# Patient Record
Sex: Female | Born: 1940
Health system: Southern US, Community
[De-identification: ages and names within clinical notes are randomized; demographics above are authoritative.]

## PROBLEM LIST (undated history)

## (undated) ENCOUNTER — Emergency Department (HOSPITAL_COMMUNITY): Disposition: A | Discharge status: Home / Self Care | Payer: Medicare HMO

## (undated) DIAGNOSIS — E042 Nontoxic multinodular goiter: Secondary | ICD-10-CM

## (undated) DIAGNOSIS — I509 Heart failure, unspecified: Secondary | ICD-10-CM

## (undated) DIAGNOSIS — I480 Paroxysmal atrial fibrillation: Secondary | ICD-10-CM

## (undated) DIAGNOSIS — I5030 Unspecified diastolic (congestive) heart failure: Secondary | ICD-10-CM

## (undated) DIAGNOSIS — R1310 Dysphagia, unspecified: Secondary | ICD-10-CM

## (undated) DIAGNOSIS — I10 Essential (primary) hypertension: Secondary | ICD-10-CM

## (undated) DIAGNOSIS — I2721 Secondary pulmonary arterial hypertension: Secondary | ICD-10-CM

## (undated) DIAGNOSIS — I340 Nonrheumatic mitral (valve) insufficiency: Secondary | ICD-10-CM

## (undated) DIAGNOSIS — I779 Disorder of arteries and arterioles, unspecified: Secondary | ICD-10-CM

## (undated) DIAGNOSIS — E739 Lactose intolerance, unspecified: Secondary | ICD-10-CM

## (undated) DIAGNOSIS — K760 Fatty (change of) liver, not elsewhere classified: Secondary | ICD-10-CM

## (undated) DIAGNOSIS — E669 Obesity, unspecified: Secondary | ICD-10-CM

## (undated) DIAGNOSIS — M419 Scoliosis, unspecified: Secondary | ICD-10-CM

## (undated) DIAGNOSIS — M25569 Pain in unspecified knee: Secondary | ICD-10-CM

## (undated) DIAGNOSIS — G4733 Obstructive sleep apnea (adult) (pediatric): Secondary | ICD-10-CM

## (undated) DIAGNOSIS — R0602 Shortness of breath: Secondary | ICD-10-CM

## (undated) DIAGNOSIS — Z9289 Personal history of other medical treatment: Secondary | ICD-10-CM

## (undated) DIAGNOSIS — M1990 Unspecified osteoarthritis, unspecified site: Secondary | ICD-10-CM

## (undated) DIAGNOSIS — K8020 Calculus of gallbladder without cholecystitis without obstruction: Secondary | ICD-10-CM

## (undated) DIAGNOSIS — M549 Dorsalgia, unspecified: Secondary | ICD-10-CM

## (undated) DIAGNOSIS — I4819 Other persistent atrial fibrillation: Secondary | ICD-10-CM

## (undated) DIAGNOSIS — M1710 Unilateral primary osteoarthritis, unspecified knee: Secondary | ICD-10-CM

## (undated) DIAGNOSIS — R600 Localized edema: Secondary | ICD-10-CM

## (undated) DIAGNOSIS — K219 Gastro-esophageal reflux disease without esophagitis: Secondary | ICD-10-CM

## (undated) HISTORY — DX: Nontoxic multinodular goiter: E04.2

## (undated) HISTORY — DX: Localized edema: R60.0

## (undated) HISTORY — DX: Unspecified diastolic (congestive) heart failure: I50.30

## (undated) HISTORY — DX: Disorder of arteries and arterioles, unspecified: I77.9

## (undated) HISTORY — DX: Lactose intolerance, unspecified: E73.9

## (undated) HISTORY — DX: Obstructive sleep apnea (adult) (pediatric): G47.33

## (undated) HISTORY — DX: Calculus of gallbladder without cholecystitis without obstruction: K80.20

## (undated) HISTORY — DX: Fatty (change of) liver, not elsewhere classified: K76.0

## (undated) HISTORY — DX: Nonrheumatic mitral (valve) insufficiency: I34.0

## (undated) HISTORY — DX: Other persistent atrial fibrillation: I48.19

## (undated) HISTORY — DX: Paroxysmal atrial fibrillation: I48.0

## (undated) HISTORY — DX: Secondary pulmonary arterial hypertension: I27.21

## (undated) HISTORY — DX: Shortness of breath: R06.02

## (undated) HISTORY — DX: Dorsalgia, unspecified: M54.9

## (undated) HISTORY — DX: Unilateral primary osteoarthritis, unspecified knee: M17.10

## (undated) HISTORY — DX: Obesity, unspecified: E66.9

## (undated) HISTORY — DX: Scoliosis, unspecified: M41.9

## (undated) HISTORY — DX: Personal history of other medical treatment: Z92.89

## (undated) HISTORY — DX: Dysphagia, unspecified: R13.10

## (undated) HISTORY — DX: Pain in unspecified knee: M25.569

---
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
## 2002-05-16 HISTORY — PX: KNEE ARTHROSCOPY: SUR90

## 2006-02-21 ENCOUNTER — Ambulatory Visit: Payer: Self-pay | Admitting: Family Medicine

## 2007-05-17 HISTORY — PX: REPLACEMENT TOTAL KNEE: SUR1224

## 2007-07-30 DIAGNOSIS — E7800 Pure hypercholesterolemia, unspecified: Secondary | ICD-10-CM | POA: Insufficient documentation

## 2007-10-31 ENCOUNTER — Ambulatory Visit: Payer: Self-pay | Admitting: Specialist

## 2007-12-12 DIAGNOSIS — G4733 Obstructive sleep apnea (adult) (pediatric): Secondary | ICD-10-CM | POA: Insufficient documentation

## 2008-04-01 ENCOUNTER — Encounter: Payer: Self-pay | Admitting: Orthopedic Surgery

## 2008-04-15 ENCOUNTER — Encounter: Payer: Self-pay | Admitting: Orthopedic Surgery

## 2008-05-16 ENCOUNTER — Encounter: Payer: Self-pay | Admitting: Orthopedic Surgery

## 2008-05-16 HISTORY — PX: HAMMER TOE SURGERY: SHX385

## 2008-08-20 ENCOUNTER — Ambulatory Visit: Payer: Self-pay | Admitting: Family Medicine

## 2008-08-20 DIAGNOSIS — M542 Cervicalgia: Secondary | ICD-10-CM | POA: Insufficient documentation

## 2009-04-20 ENCOUNTER — Ambulatory Visit: Payer: Self-pay | Admitting: Gastroenterology

## 2009-06-20 DIAGNOSIS — E041 Nontoxic single thyroid nodule: Secondary | ICD-10-CM | POA: Insufficient documentation

## 2010-09-15 ENCOUNTER — Ambulatory Visit: Payer: Self-pay | Admitting: Family Medicine

## 2010-12-13 ENCOUNTER — Ambulatory Visit: Payer: Self-pay | Admitting: Family Medicine

## 2011-01-12 ENCOUNTER — Ambulatory Visit: Payer: Self-pay | Admitting: Otolaryngology

## 2011-09-29 DIAGNOSIS — Z96659 Presence of unspecified artificial knee joint: Secondary | ICD-10-CM | POA: Insufficient documentation

## 2011-09-29 DIAGNOSIS — Z96652 Presence of left artificial knee joint: Secondary | ICD-10-CM | POA: Insufficient documentation

## 2011-09-29 DIAGNOSIS — M1710 Unilateral primary osteoarthritis, unspecified knee: Secondary | ICD-10-CM | POA: Insufficient documentation

## 2011-09-29 DIAGNOSIS — M179 Osteoarthritis of knee, unspecified: Secondary | ICD-10-CM | POA: Insufficient documentation

## 2011-09-29 DIAGNOSIS — M1712 Unilateral primary osteoarthritis, left knee: Secondary | ICD-10-CM | POA: Insufficient documentation

## 2011-09-29 DIAGNOSIS — M5417 Radiculopathy, lumbosacral region: Secondary | ICD-10-CM | POA: Insufficient documentation

## 2011-10-03 DIAGNOSIS — M1610 Unilateral primary osteoarthritis, unspecified hip: Secondary | ICD-10-CM | POA: Insufficient documentation

## 2012-12-05 ENCOUNTER — Ambulatory Visit: Payer: Self-pay | Admitting: Otolaryngology

## 2013-04-05 ENCOUNTER — Emergency Department: Payer: Self-pay | Admitting: Internal Medicine

## 2013-04-08 ENCOUNTER — Ambulatory Visit: Payer: Self-pay | Admitting: Specialist

## 2013-04-08 HISTORY — PX: SKIN GRAFT: SHX250

## 2013-08-06 ENCOUNTER — Ambulatory Visit: Payer: Self-pay | Admitting: Family Medicine

## 2013-11-14 ENCOUNTER — Other Ambulatory Visit: Payer: Self-pay | Admitting: Family Medicine

## 2014-02-20 LAB — HM COLONOSCOPY

## 2014-09-05 DIAGNOSIS — H2513 Age-related nuclear cataract, bilateral: Secondary | ICD-10-CM | POA: Diagnosis not present

## 2014-09-05 NOTE — Op Note (Signed)
PATIENT NAME:  WHITLEY, PATCHEN MR#:  [id number] DATE OF BIRTH:  29-Oct-1940  DATE OF PROCEDURE:  04/08/2013  PREOPERATIVE DIAGNOSIS: Amputation, left index finger, through mid distal phalanx.   POSTOPERATIVE DIAGNOSIS:  Amputation, left index finger, through mid distal phalanx.   PROCEDURE:  Irrigation and debridement, left index finger, with full-thickness skin grafting.   SURGEON: Christophe Louis, M.D.   ANESTHESIA: General.   COMPLICATIONS: None.   TOURNIQUET TIME: None.   DESCRIPTION OF PROCEDURE:  One gram of Ancef was given intravenously prior to the procedure. General anesthesia is induced. The left hand and upper extremity are thoroughly prepped with alcohol and ChloraPrep and draped in standard sterile fashion. Under loupe magnification, the tip of the index finger is thoroughly debrided including 2 pieces of bone which are comminuted and protruding. After debriding all dead tissue and bone, the wound is thoroughly irrigated multiple times with normal saline. Full-thickness skin graft is then taken elliptically from the hypothenar eminence in the size that would fit the tip of the index finger. The skin edges at the hypothenar eminence are undermined, and the skin is then closed with 4-0 nylon. The skin edges are infiltrated with 0.5% plain Marcaine. The full-thickness skin graft is then sewn using multiple 4-0 nylon sutures onto the tip of the index finger and then a bolus-type dressing is firmly sewn down consisting of Telfa and Neosporin. Soft bulky dressing is applied. The patient is returned to the recovery room in satisfactory condition having tolerated the procedure quite well.     ____________________________ Lucas Mallow, MD ces:dmm D: 04/09/2013 08:33:25 ET T: 04/09/2013 09:33:39 ET JOB#: [id number]  cc: Lucas Mallow, MD, <Dictator> Lucas Mallow MD ELECTRONICALLY SIGNED 04/10/2013 6:41

## 2014-10-06 DIAGNOSIS — Z0000 Encounter for general adult medical examination without abnormal findings: Secondary | ICD-10-CM | POA: Diagnosis not present

## 2014-10-06 DIAGNOSIS — Z23 Encounter for immunization: Secondary | ICD-10-CM | POA: Diagnosis not present

## 2014-10-24 DIAGNOSIS — K219 Gastro-esophageal reflux disease without esophagitis: Secondary | ICD-10-CM | POA: Diagnosis not present

## 2014-10-24 DIAGNOSIS — I10 Essential (primary) hypertension: Secondary | ICD-10-CM | POA: Diagnosis not present

## 2014-10-24 DIAGNOSIS — E780 Pure hypercholesterolemia: Secondary | ICD-10-CM | POA: Diagnosis not present

## 2014-11-05 ENCOUNTER — Telehealth: Payer: Self-pay | Admitting: Family Medicine

## 2014-11-05 ENCOUNTER — Other Ambulatory Visit: Payer: Self-pay | Admitting: Family Medicine

## 2014-11-05 DIAGNOSIS — Z1231 Encounter for screening mammogram for malignant neoplasm of breast: Secondary | ICD-10-CM

## 2014-11-05 NOTE — Telephone Encounter (Signed)
Pt returning call.  CB#[phone]/MJ

## 2014-11-07 ENCOUNTER — Ambulatory Visit: Payer: Self-pay

## 2014-11-10 ENCOUNTER — Encounter: Payer: Self-pay | Admitting: Family Medicine

## 2014-11-10 ENCOUNTER — Telehealth: Payer: Self-pay | Admitting: Family Medicine

## 2014-11-10 DIAGNOSIS — E785 Hyperlipidemia, unspecified: Secondary | ICD-10-CM

## 2014-11-10 DIAGNOSIS — M79642 Pain in left hand: Secondary | ICD-10-CM

## 2014-11-10 MED ORDER — ATORVASTATIN CALCIUM 10 MG PO TABS: 10.0000 mg | ORAL_TABLET | Freq: Every day | ORAL | Status: DC

## 2014-11-10 NOTE — Telephone Encounter (Signed)
Patient refused medication. Patient reports she will work on lifestyle changes and will recheck labs in one year. I called [facility] to cancel prescription. Patient is requesting a referral to see Dr. Tamala Julian at [facility].

## 2014-11-10 NOTE — Telephone Encounter (Signed)
Sent rx. Have patient call for new lab slip to recheck labs in 6 weeks.  Stop if any side effects, especially muscle cramps or pain. Thanks.   [name] Kitchen

## 2014-11-12 ENCOUNTER — Ambulatory Visit
Admission: RE | Admit: 2014-11-12 | Discharge: 2014-11-12 | Disposition: A | Discharge status: Home / Self Care | Payer: Commercial Managed Care - HMO | Source: Ambulatory Visit | Attending: Family Medicine | Admitting: Family Medicine

## 2014-11-12 ENCOUNTER — Other Ambulatory Visit: Payer: Self-pay | Admitting: Family Medicine

## 2014-11-12 DIAGNOSIS — Z1231 Encounter for screening mammogram for malignant neoplasm of breast: Secondary | ICD-10-CM | POA: Diagnosis not present

## 2014-11-13 ENCOUNTER — Encounter: Payer: Self-pay | Admitting: Family Medicine

## 2014-11-18 DIAGNOSIS — K589 Irritable bowel syndrome without diarrhea: Secondary | ICD-10-CM | POA: Insufficient documentation

## 2014-11-18 DIAGNOSIS — K219 Gastro-esophageal reflux disease without esophagitis: Secondary | ICD-10-CM | POA: Insufficient documentation

## 2014-11-25 DIAGNOSIS — M6588 Other synovitis and tenosynovitis, other site: Secondary | ICD-10-CM | POA: Diagnosis not present

## 2015-02-16 ENCOUNTER — Telehealth: Payer: Self-pay | Admitting: *Deleted

## 2015-02-16 ENCOUNTER — Encounter: Payer: Self-pay | Admitting: Family Medicine

## 2015-02-16 ENCOUNTER — Ambulatory Visit (INDEPENDENT_AMBULATORY_CARE_PROVIDER_SITE_OTHER): Payer: Commercial Managed Care - HMO | Admitting: Family Medicine

## 2015-02-16 VITALS — BP 142/78 | HR 72 | Temp 98.2°F | Resp 16 | Ht 66.25 in | Wt 212.0 lb

## 2015-02-16 DIAGNOSIS — M546 Pain in thoracic spine: Secondary | ICD-10-CM

## 2015-02-16 DIAGNOSIS — R0602 Shortness of breath: Secondary | ICD-10-CM

## 2015-02-16 DIAGNOSIS — M549 Dorsalgia, unspecified: Secondary | ICD-10-CM

## 2015-02-16 NOTE — Progress Notes (Signed)
Subjective:    Patient ID: Lauren Lloyd, female    DOB: [birth year], 74 y.o.   MRN: [id number]  Back Pain This is a new problem. The current episode started in the past 7 days. The problem occurs constantly. The problem is unchanged. The pain is present in the thoracic spine. The quality of the pain is described as stabbing and aching. Radiates to: Left arm. The pain is at a severity of 3/10 (Has been as high as 8/10). The pain is worse during the day. The symptoms are aggravated by position. Pertinent negatives include no abdominal pain, bladder incontinence, bowel incontinence, chest pain, dysuria, fever or leg pain. She has tried heat and analgesics for the symptoms. The treatment provided mild relief.  Shortness of Breath This is a new problem. The current episode started yesterday. The problem occurs daily. The problem has been unchanged. Associated symptoms include leg swelling (Ankles swell occasionally.). Pertinent negatives include no abdominal pain, chest pain, ear pain, fever, leg pain, syncope, vomiting or wheezing.      Review of Systems  Constitutional: Negative for fever, chills, diaphoresis, activity change, appetite change, fatigue and unexpected weight change.  HENT: Negative for ear pain.   Respiratory: Positive for chest tightness and shortness of breath. Negative for apnea, cough, choking, wheezing and stridor.   Cardiovascular: Positive for leg swelling (Ankles swell occasionally.). Negative for chest pain, palpitations and syncope.  Gastrointestinal: Positive for nausea. Negative for vomiting, abdominal pain, diarrhea, constipation, blood in stool, abdominal distention, anal bleeding, rectal pain and bowel incontinence.  Genitourinary: Positive for urgency and frequency. Negative for bladder incontinence, dysuria, hematuria, flank pain, decreased urine volume, vaginal bleeding, vaginal discharge, enuresis, difficulty urinating, vaginal pain and dyspareunia.   Musculoskeletal: Positive for back pain.  All other systems reviewed and are negative.   Patient Active Problem List   Diagnosis Date Noted  . Acid reflux 11/18/2014  . 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 Hyperlipemia 11/10/2014  . Primary osteoarthritis of one hip 10/03/2011  . L-S radiculopathy 09/29/2011  . Arthritis of knee, degenerative 09/29/2011  . H/O knee surgery 09/29/2011  . History of knee surgery 09/29/2011  . Non-toxic uninodular goiter 06/20/2009  . Cervical pain 08/20/2008  . Cannot sleep 12/12/2007  . Hypercholesteremia 07/30/2007   Family History  Problem Relation Age of Onset  . Hyperlipidemia Sister   . Transient ischemic attack Mother   . Heart attack Father   . Healthy Brother   . Breast cancer Sister 31  . Hyperlipidemia Sister    Social History   Social History  . Marital Status: Single    Spouse Name: N/A  . Number of Children: 5  . Years of Education: H/S   Occupational History  . Part Time    Social History Main Topics  . Smoking status: Former Smoker    Quit date: 05/17/1991  . Smokeless tobacco: Never Used  . Alcohol Use: Yes     Comment: Occasional Glass of Wine.  . Drug Use: No  . Sexual Activity: Not on file   Other Topics Concern  . Not on file   Social History Narrative   Past Surgical History  Procedure Laterality Date  . Hammer toe surgery  2010  . Skin graft Left 04/08/2013    Done on left index finger  . Replacement total knee Right 2009    [facility]  . Knee arthroscopy Right 2004  . Tonsillectomy  1946   Allergies  Allergen Reactions  .  Iodinated Diagnostic Agents     Other reaction(s): Unknown  . Levofloxacin     Other reaction(s): Joint Pains   Previous Medications   HYDROCOD POLST-CHLORPHEN POLST    Take by mouth.   MELOXICAM (MOBIC) 15 MG TABLET    Take 15 mg by mouth daily. with food   OMEPRAZOLE (PRILOSEC) 20 MG CAPSULE    Take by mouth.   BP 142/78 mmHg  Pulse 72  Temp(Src)  98.2 F (36.8 C) (Oral)  Resp 16  Ht 5' 6.25" (1.683 m)  Wt 212 lb (96.163 kg)  BMI 33.95 kg/m2      Objective:   Physical Exam  Constitutional: She appears well-developed and well-nourished.  Cardiovascular: Normal rate, regular rhythm and S1 normal.   Pulmonary/Chest: Effort normal and breath sounds normal.  Musculoskeletal: Normal range of motion.       Thoracic back: She exhibits pain (Below her left shoulder. ).  Neurological: She is alert.  Psychiatric: She has a normal mood and affect. Her speech is normal and behavior is normal. Judgment and thought content normal. Cognition and memory are normal.       Assessment & Plan:   1. Upper back pain on left side Unclear if muscle strain. Will monitor.    2. Shortness of breath Concerning for cardiac issue.EKG ok today.  Will refer to cardiology. ER if any worsening SOB or chest pain. Continue ASA.   - EKG 12-Lead  Patient was seen and examined by Jerrell Belfast, MD, and note scribed by Ashley Royalty, CMA.  I have reviewed the document for accuracy and completeness and I agree with above. Jerrell Belfast, MD   Margarita Rana, MD  - Ambulatory referral to Cardiology

## 2015-02-16 NOTE — Telephone Encounter (Signed)
Sarah from [place] [facility] calling needing pt to get asap per Dr Venia Minks.  States pt is SOB and we did not have anything close, stated to her we would look at the EKG and give them a call back  Please call today [phone] ext [phone] for Judson Roch

## 2015-02-17 ENCOUNTER — Ambulatory Visit (INDEPENDENT_AMBULATORY_CARE_PROVIDER_SITE_OTHER): Payer: Commercial Managed Care - HMO | Admitting: Cardiovascular Disease

## 2015-02-17 ENCOUNTER — Other Ambulatory Visit
Admission: RE | Admit: 2015-02-17 | Discharge: 2015-02-17 | Disposition: A | Discharge status: Home / Self Care | Payer: Commercial Managed Care - HMO | Source: Ambulatory Visit | Attending: Cardiovascular Disease | Admitting: Cardiovascular Disease

## 2015-02-17 ENCOUNTER — Encounter: Payer: Self-pay | Admitting: Cardiovascular Disease

## 2015-02-17 VITALS — BP 154/72 | HR 72 | Ht 66.5 in | Wt 209.8 lb

## 2015-02-17 DIAGNOSIS — E785 Hyperlipidemia, unspecified: Secondary | ICD-10-CM | POA: Diagnosis not present

## 2015-02-17 DIAGNOSIS — R0602 Shortness of breath: Secondary | ICD-10-CM | POA: Diagnosis not present

## 2015-02-17 DIAGNOSIS — R079 Chest pain, unspecified: Secondary | ICD-10-CM | POA: Diagnosis not present

## 2015-02-17 DIAGNOSIS — R0789 Other chest pain: Secondary | ICD-10-CM | POA: Diagnosis not present

## 2015-02-17 LAB — BRAIN NATRIURETIC PEPTIDE: B NATRIURETIC PEPTIDE 5: 177 pg/mL — AB (ref 0.0–100.0)

## 2015-02-17 LAB — FIBRIN DERIVATIVES D-DIMER (ARMC ONLY): FIBRIN DERIVATIVES D-DIMER (ARMC): 676 — AB (ref 0–499)

## 2015-02-17 LAB — TROPONIN I: Troponin I: <0.03 ng/mL (ref <0.031)

## 2015-02-17 NOTE — Patient Instructions (Addendum)
Medication Instructions:  Your physician recommends that you continue on your current medications as directed. Please refer to the Current Medication list given to you today.   Labwork: Troponin, d-dimer, BNP  Testing/Procedures: Your physician has requested that you have an echocardiogram. Echocardiography is a painless test that uses sound waves to create images of your heart. It provides your doctor with information about the size and shape of your heart and how well your heart's chambers and valves are working. This procedure takes approximately one hour. There are no restrictions for this procedure.  Your physician has requested that you have a lexiscan myoview. For further information please visit [email or web address]. Please follow instruction sheet, as given.  [place]  Your caregiver has ordered a Stress Test with nuclear imaging. The purpose of this test is to evaluate the blood supply to your heart muscle. This procedure is referred to as a "Non-Invasive Stress Test." This is because other than having an IV started in your vein, nothing is inserted or "invades" your body. Cardiac stress tests are done to find areas of poor blood flow to the heart by determining the extent of coronary artery disease (CAD). Some patients exercise on a treadmill, which naturally increases the blood flow to your heart, while others who are  unable to walk on a treadmill due to physical limitations have a pharmacologic/chemical stress agent called Lexiscan . This medicine will mimic walking on a treadmill by temporarily increasing your coronary blood flow.   Please note: these test may take anywhere between 2-4 hours to complete  PLEASE REPORT TO [facility] AT THE FIRST DESK WILL DIRECT YOU WHERE TO GO  Date of Procedure: Monday, October 10, 8:30am Arrival Time for Procedure: 8:00am  Instructions regarding medication:   You may take all your morning medications with  a sip of water  PLEASE NOTIFY THE OFFICE AT LEAST 24 HOURS IN ADVANCE IF YOU ARE UNABLE TO [employer].  [phone] AND  PLEASE NOTIFY NUCLEAR MEDICINE AT [facility] AT LEAST 24 HOURS IN ADVANCE IF YOU ARE UNABLE TO KEEP YOUR APPOINTMENT. [phone]  How to prepare for your Myoview test:  1. Do not eat or drink after midnight 2. No caffeine for 24 hours prior to test 3. No smoking 24 hours prior to test. 4. Your medication may be taken with water.  If your doctor stopped a medication because of this test, do not take that medication. 5. Ladies, please do not wear dresses.  Skirts or pants are appropriate. Please wear a short sleeve shirt. 6. No perfume, cologne or lotion. 7. Wear comfortable walking shoes. No heels!            Follow-Up: Your physician recommends that you schedule a follow-up appointment after all tests are complete.   Any Other Special Instructions Will Be Listed Below (If Applicable).  Echocardiogram An echocardiogram, or echocardiography, uses sound waves (ultrasound) to produce an image of your heart. The echocardiogram is simple, painless, obtained within a short period of time, and offers valuable information to your health care provider. The images from an echocardiogram can provide information such as:  Evidence of coronary artery disease (CAD).  Heart size.  Heart muscle function.  Heart valve function.  Aneurysm detection.  Evidence of a past heart attack.  Fluid buildup around the heart.  Heart muscle thickening.  Assess heart valve function. LET [facility] CARE PROVIDER KNOW ABOUT:  Any allergies you have.  All medicines you are  taking, including vitamins, herbs, eye drops, creams, and over-the-counter medicines.  Previous problems you or members of your family have had with the use of anesthetics.  Any blood disorders you have.  Previous surgeries you have had.  Medical conditions you have.  Possibility of  pregnancy, if this applies. BEFORE THE PROCEDURE  No special preparation is needed. Eat and drink normally.  PROCEDURE  8. In order to produce an image of your heart, gel will be applied to your chest and a wand-like tool (transducer) will be moved over your chest. The gel will help transmit the sound waves from the transducer. The sound waves will harmlessly bounce off your heart to allow the heart images to be captured in real-time motion. These images will then be recorded. 9. You may need an IV to receive a medicine that improves the quality of the pictures. AFTER THE PROCEDURE You may return to your normal schedule including diet, activities, and medicines, unless your health care provider tells you otherwise. Document Released: 04/29/2000 Document Revised: 09/16/2013 Document Reviewed: 01/07/2013 [facility] Patient Information 2015 [place], [place]. This information is not intended to replace advice given to you by your health care provider. Make sure you discuss any questions you have with your health care provider. Nuclear Medicine Exam A nuclear medicine exam is a safe and painless imaging test. It helps to detect and diagnose disease in the body as well as provide information about organ function and structure.  Nuclear scans are most often done of the:  Lungs.  Heart.  Thyroid gland.  Bones.  Abdomen. HOW A NUCLEAR MEDICINE EXAM WORKS A nuclear medicine exam works by using a radioactive tracer. The material is given either by an IV (intravenous) injection or it may be swallowed. After the tracer is in the body, it is absorbed by your body's organs. A large scanning machine that uses a special camera detects the radioactivity in your body. A computerized image is then formed regarding the area of concern. The small amounts of radioactive material used in a nuclear medicine exam are found to be medically safe. However, because radioactive material is used, this test is not done if you  are pregnant or nursing.  BEFORE THE PROCEDURE  If available, bring previous imaging studies such as x-rays, etc. with you to the exam.  Arrive early for your exam. PROCEDURE 10. An IV may be started before the exam begins. 11. Depending on the type of examination, will lie on a table or sit in a chair during the exam. 12. The nuclear medicine exam will take about 30 to 60 minutes to complete. AFTER THE PROCEDURE  After your scan is completed, the image(s) will be evaluated by a specialist. It is important that you follow up with your caregiver to find out your test results.  You may return to your regular activity as instructed by your caregiver. SEEK IMMEDIATE MEDICAL CARE IF: You have shortness of breath or difficulty breathing. MAKE SURE YOU:   Understand these instructions.  Will watch your condition.  Will get help right away if you are not doing well or get worse. Document Released: 06/09/2004 Document Revised: 07/25/2011 Document Reviewed: 07/24/2008 [facility] Patient Information 2015 [place], [place]. This information is not intended to replace advice given to you by your health care provider. Make sure you discuss any questions you have with your health care provider.

## 2015-02-17 NOTE — Assessment & Plan Note (Signed)
Recent lipid profile showed a total cholesterol of 249, triglyceride of 117, HDL of 74 and an LDL of 152. If testing shows evidence of atherosclerosis or if she fails lifestyle changes, treatment with a statin is indicated.

## 2015-02-17 NOTE — Assessment & Plan Note (Signed)
The patient reports recent episodes of dyspnea with minimal activities, back and chest discomfort radiating to her left arm. Baseline ECG is completely normal. Nonetheless, her symptoms are concerning. Thus, I requested stat labs including troponin, d-dimer and BNP. I recommend evaluation with a treadmill nuclear stress test given her risk factors for coronary artery disease. She also has a cardiac murmur which has not been evaluated before. I requested an echocardiogram for this.
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
## 2015-02-17 NOTE — Telephone Encounter (Signed)
Can add her on to Nahser's schedule for Monday at 10:00am.

## 2015-02-18 ENCOUNTER — Telehealth: Payer: Self-pay

## 2015-02-18 ENCOUNTER — Other Ambulatory Visit: Payer: Self-pay

## 2015-02-18 ENCOUNTER — Ambulatory Visit
Admission: RE | Admit: 2015-02-18 | Discharge: 2015-02-18 | Disposition: A | Discharge status: Home / Self Care | Payer: Commercial Managed Care - HMO | Source: Ambulatory Visit | Attending: Cardiovascular Disease | Admitting: Cardiovascular Disease

## 2015-02-18 DIAGNOSIS — R0602 Shortness of breath: Secondary | ICD-10-CM | POA: Diagnosis not present

## 2015-02-18 DIAGNOSIS — E049 Nontoxic goiter, unspecified: Secondary | ICD-10-CM | POA: Insufficient documentation

## 2015-02-18 DIAGNOSIS — R7989 Other specified abnormal findings of blood chemistry: Secondary | ICD-10-CM

## 2015-02-18 DIAGNOSIS — R791 Abnormal coagulation profile: Secondary | ICD-10-CM | POA: Diagnosis not present

## 2015-02-18 LAB — POCT I-STAT CREATININE: CREATININE: 0.7 mg/dL (ref 0.44–1.00)

## 2015-02-18 MED ORDER — IOHEXOL 350 MG/ML SOLN
100.0000 mL | Freq: Once | INTRAVENOUS | Status: AC | PRN
  Administered 2015-02-18: 100 mL via INTRAVENOUS

## 2015-02-18 NOTE — Telephone Encounter (Signed)
Pt called back stating she has made arrangements for someone else to go to OIB and will have CTA today. Pt again states she does not have an allergy to any dye. Allergy list was updated.  Called scheduling who confirmed w/nuc med to have patient come to [facility] now.  Call pt back to inform her of plan. Pt states she will go now to [facility] for CTA to rule out PE.

## 2015-02-18 NOTE — Telephone Encounter (Signed)
Per verbal from Dr. Fletcher Anon, can call pt w/CT angio results - negative for PE. S/w pt of results. Inquired as to whether a reaction to the dye. Pt states at this time, she has had no reaction to the contrast dye. Pt verbalized understanding of results with no further questions. Confirmed treadmill myoview 10/10.

## 2015-02-18 NOTE — Telephone Encounter (Signed)
Lauren Lloyd in radiology called to notify of CT results - negative for PE.

## 2015-02-20 ENCOUNTER — Encounter: Payer: Self-pay | Admitting: Cardiovascular Disease

## 2015-02-23 ENCOUNTER — Ambulatory Visit
Admission: RE | Admit: 2015-02-23 | Discharge: 2015-02-23 | Disposition: A | Discharge status: Home / Self Care | Payer: Commercial Managed Care - HMO | Source: Ambulatory Visit | Attending: Cardiovascular Disease | Admitting: Cardiovascular Disease

## 2015-02-23 DIAGNOSIS — R079 Chest pain, unspecified: Secondary | ICD-10-CM

## 2015-02-23 DIAGNOSIS — R0602 Shortness of breath: Secondary | ICD-10-CM | POA: Insufficient documentation

## 2015-02-23 LAB — NM MYOCAR MULTI W/SPECT W/WALL MOTION / EF
CHL CUP NUCLEAR SSS: 2
CHL CUP RESTING HR STRESS: 65 {beats}/min
CSEPED: 4 min
CSEPEDS: 50 s
CSEPPHR: 133 {beats}/min
Estimated workload: 6.8 METS
LV dias vol: 73 mL
LVSYSVOL: 20 mL
MPHR: 145 {beats}/min
NUC STRESS TID: 0.83
Percent HR: 91 %
SDS: 1
SRS: 1

## 2015-02-23 MED ORDER — TECHNETIUM TC 99M SESTAMIBI - CARDIOLITE
30.0000 | Freq: Once | INTRAVENOUS | Status: AC | PRN
  Administered 2015-02-23: 10:00:00 31.89 via INTRAVENOUS

## 2015-02-23 MED ORDER — TECHNETIUM TC 99M SESTAMIBI GENERIC - CARDIOLITE
13.0000 | Freq: Once | INTRAVENOUS | Status: AC | PRN
  Administered 2015-02-23: 13.88 via INTRAVENOUS

## 2015-03-12 ENCOUNTER — Ambulatory Visit (INDEPENDENT_AMBULATORY_CARE_PROVIDER_SITE_OTHER): Payer: Commercial Managed Care - HMO

## 2015-03-12 ENCOUNTER — Other Ambulatory Visit: Payer: Self-pay

## 2015-03-12 DIAGNOSIS — R0602 Shortness of breath: Secondary | ICD-10-CM

## 2015-03-12 DIAGNOSIS — R079 Chest pain, unspecified: Secondary | ICD-10-CM | POA: Diagnosis not present

## 2015-04-02 ENCOUNTER — Ambulatory Visit (INDEPENDENT_AMBULATORY_CARE_PROVIDER_SITE_OTHER): Payer: Commercial Managed Care - HMO | Admitting: Cardiovascular Disease

## 2015-04-02 ENCOUNTER — Encounter: Payer: Self-pay | Admitting: Cardiovascular Disease

## 2015-04-02 VITALS — BP 148/72 | HR 74 | Ht 68.0 in | Wt 213.0 lb

## 2015-04-02 DIAGNOSIS — I272 Other secondary pulmonary hypertension: Secondary | ICD-10-CM

## 2015-04-02 DIAGNOSIS — R0789 Other chest pain: Secondary | ICD-10-CM | POA: Diagnosis not present

## 2015-04-02 DIAGNOSIS — E7800 Pure hypercholesterolemia, unspecified: Secondary | ICD-10-CM | POA: Diagnosis not present

## 2015-04-02 NOTE — Progress Notes (Signed)
Primary care physician: Dr. Margarita Rana.  HPI  This is a pleasant 74 year old female who is here today for a follow-up visit regarding  chest pain and shortness of breath.  She is known to have history of hyperlipidemia but she is resistant to starting treatment with a statin  She quit smoking about 25 years ago. Her father died at the age of 18 of myocardial infarction but overall there is no family history of premature coronary artery disease.  She was seen recently seen for chest pain and exertional dyspnea. She had labs done that showed mildly elevated d-dimer and BNP of 170. She had CT of the chest showed no evidence of pulmonary embolism. There was mild coronary calcifications. Treadmill nuclear stress test showed no evidence of ischemia with normal ejection fraction. Echocardiogram showed normal LV systolic function with moderate pulmonary hypertension. Estimated systolic pulmonary pressure was 53 mmHg. She reports resolution of chest pain. She continues to have mild exertional dyspnea.   Allergies  Allergen Reactions  . Levofloxacin     Other reaction(s): Joint Pains     Current Outpatient Prescriptions on File Prior to Visit  Medication Sig Dispense Refill  . omeprazole (PRILOSEC) 20 MG capsule Take by mouth.     No current facility-administered medications on file prior to visit.     History reviewed. No pertinent past medical history.   Past Surgical History  Procedure Laterality Date  . Hammer toe surgery  2010  . Skin graft Left 04/08/2013    Done on left index finger  . Replacement total knee Right 2009    [facility]  . Knee arthroscopy Right 2004  . Tonsillectomy  1946     Family History  Problem Relation Age of Onset  . Hyperlipidemia Sister   . Transient ischemic attack Mother   . Heart attack Father   . Healthy Brother   . Breast cancer Sister 49  . Hyperlipidemia Sister      Social History   Social History  . Marital  Status: Single    Spouse Name: N/A  . Number of Children: 5  . Years of Education: H/S   Occupational History  . Part Time    Social History Main Topics  . Smoking status: Former Smoker -- 1.00 packs/day for 30 years    Types: Cigarettes    Quit date: 05/17/1991  . Smokeless tobacco: Never Used  . Alcohol Use: 4.2 oz/week    7 Standard drinks or equivalent per week     Comment: Occasional Glass of Wine.  . Drug Use: No  . Sexual Activity: Not on file   Other Topics Concern  . Not on file   Social History Narrative     ROS A 10 point review of system was performed. It is negative other than that mentioned in the history of present illness.   PHYSICAL EXAM   BP 148/72 mmHg  Pulse 74  Ht 5\' 8"  (1.727 m)  Wt 213 lb (96.616 kg)  BMI 32.39 kg/m2 Constitutional: She is oriented to person, place, and time. She appears well-developed and well-nourished. No distress.  HENT: No nasal discharge.  Head: Normocephalic and atraumatic.  Eyes: Pupils are equal and round. No discharge.  Neck: Normal range of motion. Neck supple. No JVD present. No thyromegaly present.  Cardiovascular: Normal rate, regular rhythm, normal heart sounds. Exam reveals no gallop and no friction rub.  There is a 2 out of 6 early peaking systolic murmur in the aortic  area.  Pulmonary/Chest: Effort normal and breath sounds normal. No stridor. No respiratory distress. She has no wheezes. She has no rales. She exhibits no tenderness.  Abdominal: Soft. Bowel sounds are normal. She exhibits no distension. There is no tenderness. There is no rebound and no guarding.  Musculoskeletal: Normal range of motion. She exhibits no edema and no tenderness.  Neurological: She is alert and oriented to person, place, and time. Coordination normal.  Skin: Skin is warm and dry. No rash noted. She is not diaphoretic. No erythema. No pallor.  Psychiatric: She has a normal mood and affect. Her behavior is normal. Judgment and  thought content normal.      ASSESSMENT AND PLAN

## 2015-04-02 NOTE — Patient Instructions (Signed)
Medication Instructions: No changes.   Labwork: None.   Procedures/Testing: None.   Follow-Up: As needed with Dr. Arida.   Any Additional Special Instructions Will Be Listed Below (If Applicable).   

## 2015-04-02 NOTE — Assessment & Plan Note (Signed)
Recent lipid profile showed a total cholesterol of 249, triglyceride of 117, HDL of 74 and an LDL of 152. CT scan of the chest showed mild coronary calcifications. This was discussed with the patient today and we should consider treatment with a statin to achieve an LDL of less than 100. She wants to try with lifestyle changes and weight loss and she is going to follow-up with Dr. Venia Minks for further discussion.

## 2015-04-02 NOTE — Assessment & Plan Note (Signed)
Nuclear stress test was negative for ischemia and she reports resolution of symptoms. Thus, no further workup is recommended.

## 2015-04-02 NOTE — Assessment & Plan Note (Signed)
There is no evidence of left heart failure. She does report symptoms suggestive of sleep apnea which might be contributing. Previously study 4 years ago was not completed according to the patient. Repeat sleep study can be considered.

## 2015-04-06 ENCOUNTER — Ambulatory Visit: Payer: Self-pay | Admitting: Cardiovascular Disease

## 2015-06-05 ENCOUNTER — Ambulatory Visit (INDEPENDENT_AMBULATORY_CARE_PROVIDER_SITE_OTHER): Payer: Commercial Managed Care - HMO | Admitting: Family Medicine

## 2015-06-05 ENCOUNTER — Encounter: Payer: Self-pay | Admitting: Family Medicine

## 2015-06-05 VITALS — BP 136/84 | HR 84 | Temp 98.9°F | Resp 16 | Wt 216.0 lb

## 2015-06-05 DIAGNOSIS — K219 Gastro-esophageal reflux disease without esophagitis: Secondary | ICD-10-CM | POA: Diagnosis not present

## 2015-06-05 DIAGNOSIS — J069 Acute upper respiratory infection, unspecified: Secondary | ICD-10-CM | POA: Insufficient documentation

## 2015-06-05 MED ORDER — HYDROCODONE-HOMATROPINE 5-1.5 MG/5ML PO SYRP: 5.0000 mL | ORAL_SOLUTION | Freq: Three times a day (TID) | ORAL | Status: DC | PRN

## 2015-06-05 MED ORDER — OMEPRAZOLE 20 MG PO CPDR: 20.0000 mg | DELAYED_RELEASE_CAPSULE | Freq: Two times a day (BID) | ORAL | Status: DC

## 2015-06-05 MED ORDER — AMOXICILLIN-POT CLAVULANATE 875-125 MG PO TABS: 1.0000 | ORAL_TABLET | Freq: Two times a day (BID) | ORAL | Status: DC

## 2015-06-05 NOTE — Progress Notes (Signed)
Subjective:     Patient ID: Lauren Lloyd, female   DOB: 1941/02/25, 75 y.o.   MRN: [id number]  Chief Complaint  Patient presents with  . URI    URI  This is a new problem. The current episode started in the past 7 days (5 days ago). The problem has been gradually worsening. Maximum temperature: low grade per patient report.   The fever has been present for less than 1 day. Associated symptoms include congestion, coughing (dry cough that developed recently. ), ear pain, headaches, a plugged ear sensation, rhinorrhea, sinus pain and a sore throat (Initially, now improved. ). Pertinent negatives include no abdominal pain, chest pain, diarrhea, dysuria, nausea, sneezing, swollen glands or wheezing. She has tried decongestant Nurse, adult. ) for the symptoms. The treatment provided mild relief.   Patient also with history of reflux. Controlled on her current medication, but insurance will no longer pay for it. Needs to have her medication changed.     Patient Active Problem List   Diagnosis Date Noted  . Pulmonary hypertension ([place]) 04/02/2015  . Chest pain radiating to arm 02/17/2015  . Acid reflux 11/18/2014  . 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 Primary osteoarthritis of one hip 10/03/2011  . L-S radiculopathy 09/29/2011  . Arthritis of knee, degenerative 09/29/2011  . H/O knee surgery 09/29/2011  . History of knee surgery 09/29/2011  . Non-toxic uninodular goiter 06/20/2009  . Cervical pain 08/20/2008  . Cannot sleep 12/12/2007  . Hypercholesteremia 07/30/2007   Previous Medications   MELOXICAM (MOBIC) 15 MG TABLET    Take 15 mg by mouth as needed for pain.   Allergies  Allergen Reactions  . Levofloxacin     Other reaction(s): Joint Pains   Past Surgical History  Procedure Laterality Date  . Hammer toe surgery  2010  . Skin graft Left 04/08/2013    Done on left index finger  . Replacement total knee Right 2009    [facility]  . Knee arthroscopy Right 2004  .  Tonsillectomy  1946   Family History  Problem Relation Age of Onset  . Hyperlipidemia Sister   . Transient ischemic attack Mother   . Heart attack Father   . Healthy Brother   . Breast cancer Sister 60  . Hyperlipidemia Sister    Social History   Social History  . Marital Status: Single    Spouse Name: N/A  . Number of Children: 5  . Years of Education: H/S   Occupational History  . Part Time    Social History Main Topics  . Smoking status: Former Smoker -- 1.00 packs/day for 30 years    Types: Cigarettes    Quit date: 05/17/1991  . Smokeless tobacco: Never Used  . Alcohol Use: 4.2 oz/week    7 Standard drinks or equivalent per week     Comment: Occasional Glass of Wine.  . Drug Use: No  . Sexual Activity: Not on file   Other Topics Concern  . Not on file   Social History Narrative    Review of Systems  Constitutional: Positive for fever (subjective, resolved).  HENT: Positive for congestion, ear pain, rhinorrhea and sore throat (Initially, now improved. ). Negative for sneezing.   Respiratory: Positive for cough (dry cough that developed recently. ). Negative for wheezing.   Cardiovascular: Negative for chest pain.  Gastrointestinal: Negative for nausea, abdominal pain and diarrhea.  Genitourinary: Negative.  Negative for dysuria.  Neurological: Positive for headaches.  Objective:   Physical Exam  Constitutional: She is oriented to person, place, and time. She appears well-developed and well-nourished. No distress.  HENT:  Head: Normocephalic and atraumatic.  Right Ear: External ear normal.  Left Ear: External ear normal.  Mouth/Throat: Oropharynx is clear and moist. No oropharyngeal exudate.  Right TM with area of bleeding, consistent with some vessel rupture in upper, outer quadrant. No bulging or fluid noted.    Eyes: EOM are normal. Pupils are equal, round, and reactive to light. Right eye exhibits no discharge. Left eye exhibits no discharge.   Neck: Normal range of motion. Neck supple.  Cardiovascular: Normal rate, regular rhythm and normal heart sounds.   Pulmonary/Chest: Effort normal and breath sounds normal. No respiratory distress. She has no wheezes. She has no rales.  Abdominal: Soft.  Musculoskeletal: Normal range of motion.  Lymphadenopathy:    She has no cervical adenopathy.  Neurological: She is alert and oriented to person, place, and time.  Skin: She is not diaphoretic.  Psychiatric: She has a normal mood and affect.  Vitals reviewed.  BP 136/84 mmHg  Pulse 84  Temp(Src) 98.9 F (37.2 C) (Oral)  Resp 16  Wt 216 lb (97.977 kg)       Assessment:     URI, possible early sinusitis.      Plan:     1. Gastroesophageal reflux disease without esophagitis Will changed to Prilosec secondary to insurance. Encouraged patient to take lowest possible dose secondary to side effects.   - omeprazole (PRILOSEC) 20 MG capsule; Take 1 capsule (20 mg total) by mouth 2 times daily at 12 noon and 4 pm.  Dispense: 180 capsule; Refill: 1  2. Upper respiratory infection New problem. Unclear if early sinus infection. Will start cough medication.  Start antibiotic if symptoms worsen.   Patient instructed to call back if condition worsens or does not improve.    - HYDROcodone-homatropine (HYCODAN) 5-1.5 MG/5ML syrup; Take 5 mLs by mouth every 8 (eight) hours as needed for cough.  Dispense: 120 mL; Refill: 0 - amoxicillin-clavulanate (AUGMENTIN) 875-125 MG tablet; Take 1 tablet by mouth 2 (two) times daily.  Dispense: 20 tablet; Refill: 0   Margarita Rana, MD

## 2015-06-11 ENCOUNTER — Ambulatory Visit (INDEPENDENT_AMBULATORY_CARE_PROVIDER_SITE_OTHER): Payer: Commercial Managed Care - HMO | Admitting: Family Medicine

## 2015-06-11 ENCOUNTER — Encounter: Payer: Self-pay | Admitting: Family Medicine

## 2015-06-11 VITALS — BP 132/80 | HR 74 | Temp 98.0°F | Resp 18 | Wt 212.0 lb

## 2015-06-11 DIAGNOSIS — H6692 Otitis media, unspecified, left ear: Secondary | ICD-10-CM | POA: Diagnosis not present

## 2015-06-11 DIAGNOSIS — H9193 Unspecified hearing loss, bilateral: Secondary | ICD-10-CM

## 2015-06-11 MED ORDER — LORATADINE 10 MG PO TABS: 10.0000 mg | ORAL_TABLET | Freq: Every day | ORAL | Status: DC

## 2015-06-11 MED ORDER — FLUTICASONE PROPIONATE 50 MCG/ACT NA SUSP: 2.0000 | Freq: Every day | NASAL | Status: DC

## 2015-06-11 NOTE — Progress Notes (Signed)
Patient ID: Lauren Lloyd, female   DOB: 1940-06-15, 75 y.o.   MRN: [id number]    Subjective:  HPI Pt was seen on 06/05/15 by Dr. Venia Minks for URI. She was told that  TM torn in her ear from coughing. She reports that since then she can not hear anything. She would like a ENT referral if that is what she needs to do about this. He coughing and URI symptoms are better.  Prior to Admission medications   Medication Sig Start Date End Date Taking? Authorizing Provider  amoxicillin-clavulanate (AUGMENTIN) 875-125 MG tablet Take 1 tablet by mouth 2 (two) times daily. 06/05/15  Yes Margarita Rana, MD  omeprazole (PRILOSEC) 20 MG capsule Take 1 capsule (20 mg total) by mouth 2 times daily at 12 noon and 4 pm. 06/05/15  Yes Margarita Rana, MD  HYDROcodone-homatropine [facility]) 5-1.5 MG/5ML syrup Take 5 mLs by mouth every 8 (eight) hours as needed for cough. Patient not taking: Reported on 06/11/2015 06/05/15   Margarita Rana, MD  meloxicam (MOBIC) 15 MG tablet Take 15 mg by mouth as needed for pain. Reported on 06/11/2015    Historical Provider, MD    Patient Active Problem List   Diagnosis Date Noted  . Upper respiratory infection 06/05/2015  . Pulmonary hypertension ([place]) 04/02/2015  . Chest pain radiating to arm 02/17/2015  . Acid reflux 11/18/2014  . 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 Primary osteoarthritis of one hip 10/03/2011  . L-S radiculopathy 09/29/2011  . Arthritis of knee, degenerative 09/29/2011  . H/O knee surgery 09/29/2011  . History of knee surgery 09/29/2011  . Non-toxic uninodular goiter 06/20/2009  . Cervical pain 08/20/2008  . Cannot sleep 12/12/2007  . Hypercholesteremia 07/30/2007    History reviewed. No pertinent past medical history.  Social History   Social History  . Marital Status: Single    Spouse Name: N/A  . Number of Children: 5  . Years of Education: H/S   Occupational History  . Part Time    Social History Main Topics  . Smoking status: Former Smoker -- 1.00  packs/day for 30 years    Types: Cigarettes    Quit date: 05/17/1991  . Smokeless tobacco: Never Used  . Alcohol Use: 4.2 oz/week    7 Standard drinks or equivalent per week     Comment: Occasional Glass of Wine.  . Drug Use: No  . Sexual Activity: Not on file   Other Topics Concern  . Not on file   Social History Narrative    Allergies  Allergen Reactions  . Levofloxacin     Other reaction(s): Joint Pains    Review of Systems  Constitutional: Negative.   HENT: Positive for ear pain and hearing loss.   Eyes: Negative.   Respiratory: Negative.   Cardiovascular: Negative.   Gastrointestinal: Negative.   Genitourinary: Negative.   Musculoskeletal: Negative.   Skin: Negative.   Neurological: Negative.   Endo/Heme/Allergies: Negative.   Psychiatric/Behavioral: Negative.     Immunization History  Administered Date(s) Administered  . Hepatitis A 11/25/1999, 09/19/2001  . IPV 02/11/2000  . Pneumococcal Conjugate-13 10/06/2014  . Pneumococcal Polysaccharide-23 12/01/2010  . Td 02/02/1998  . Tdap 09/11/2007, 02/27/2012  . Typhoid Inactivated 02/11/2000   Objective:  BP 132/80 mmHg  Pulse 74  Temp(Src) 98 F (36.7 C) (Oral)  Resp 18  Wt 212 lb (96.163 kg)  SpO2 98%  Physical Exam  Constitutional: She is oriented to person, place, and time and well-developed, well-nourished, and in no distress.  HENT:  Head: Normocephalic and atraumatic.  Nose: Nose normal.  Mouth/Throat: Oropharynx is clear and moist.  Infected left ear  Eyes: Conjunctivae and EOM are normal. Pupils are equal, round, and reactive to light.  Neck: Normal range of motion. Neck supple.  Cardiovascular: Normal rate, regular rhythm, normal heart sounds and intact distal pulses.   Pulmonary/Chest: Effort normal and breath sounds normal.  Musculoskeletal: Normal range of motion.  Neurological: She is alert and oriented to person, place, and time. She has normal reflexes. Gait normal. GCS score is 15.   Skin: Skin is warm and dry.  Psychiatric: Mood, memory, affect and judgment normal.    No results found for: WBC, HGB, HCT, PLT, GLUCOSE, CHOL, TRIG, HDL, LDLDIRECT, LDLCALC, TSH, PSA, INR, GLUF, HGBA1C, MICROALBUR  CMP     Component Value Date/Time   CREATININE 0.70 02/18/2015 1234    Assessment and Plan :  1. Hearing loss, bilateral Probably sensorineural from swelling from URI. - Ambulatory referral to ENT  2. Left otitis media, recurrence not specified, unspecified chronicity, unspecified otitis media type - Ambulatory referral to ENT I have done the exam and reviewed the above chart and it is accurate to the best of my knowledge.  Patient was seen and examined by Dr. Miguel Aschoff, and noted scribed by Webb Laws, [place] MD [facility] 06/11/2015 10:05 AM

## 2015-06-12 DIAGNOSIS — E041 Nontoxic single thyroid nodule: Secondary | ICD-10-CM | POA: Diagnosis not present

## 2015-06-12 DIAGNOSIS — H902 Conductive hearing loss, unspecified: Secondary | ICD-10-CM | POA: Diagnosis not present

## 2015-06-12 DIAGNOSIS — H6983 Other specified disorders of Eustachian tube, bilateral: Secondary | ICD-10-CM | POA: Diagnosis not present

## 2015-06-12 DIAGNOSIS — H6503 Acute serous otitis media, bilateral: Secondary | ICD-10-CM | POA: Diagnosis not present

## 2015-06-19 ENCOUNTER — Other Ambulatory Visit: Payer: Self-pay | Admitting: Family Medicine

## 2015-06-19 DIAGNOSIS — K219 Gastro-esophageal reflux disease without esophagitis: Secondary | ICD-10-CM

## 2015-06-19 MED ORDER — OMEPRAZOLE 20 MG PO CPDR: 20.0000 mg | DELAYED_RELEASE_CAPSULE | Freq: Two times a day (BID) | ORAL | Status: DC

## 2015-06-19 NOTE — Telephone Encounter (Signed)
Pt contacted office for refill request on the following medications:  omeprazole (PRILOSEC) 20 MG capsule.  Pt is request this resent [employer] mail order.  CB#[phone]/MW

## 2015-10-16 ENCOUNTER — Telehealth: Payer: Self-pay | Admitting: Family Medicine

## 2015-10-16 DIAGNOSIS — E7800 Pure hypercholesterolemia, unspecified: Secondary | ICD-10-CM

## 2015-10-16 NOTE — Telephone Encounter (Signed)
Routine labs ordered (and pending) under HCL. Okay to order? Renaldo Fiddler, CMA

## 2015-10-16 NOTE — Telephone Encounter (Signed)
Pt has an appt 11/04/15 for a PHy but she wants to get her labs done prior to her coming in .  Can you order her labs so she can get them done the week before her visit.  Her call back is [phone]  Thanks Con Memos

## 2015-10-16 NOTE — Telephone Encounter (Signed)
Printed.  Please notify patient. Thanks.  

## 2015-10-26 DIAGNOSIS — D225 Melanocytic nevi of trunk: Secondary | ICD-10-CM | POA: Diagnosis not present

## 2015-10-26 DIAGNOSIS — Z85828 Personal history of other malignant neoplasm of skin: Secondary | ICD-10-CM | POA: Diagnosis not present

## 2015-10-26 DIAGNOSIS — D2272 Melanocytic nevi of left lower limb, including hip: Secondary | ICD-10-CM | POA: Diagnosis not present

## 2015-10-26 DIAGNOSIS — D2261 Melanocytic nevi of right upper limb, including shoulder: Secondary | ICD-10-CM | POA: Diagnosis not present

## 2015-10-29 DIAGNOSIS — E7800 Pure hypercholesterolemia, unspecified: Secondary | ICD-10-CM | POA: Diagnosis not present

## 2015-10-30 LAB — CBC WITH DIFFERENTIAL/PLATELET
BASOS ABS: 0 10*3/uL (ref 0.0–0.2)
Basos: 1 %
EOS (ABSOLUTE): 0.1 10*3/uL (ref 0.0–0.4)
Eos: 2 %
HEMOGLOBIN: 13.9 g/dL (ref 11.1–15.9)
Hematocrit: 41.6 % (ref 34.0–46.6)
Immature Grans (Abs): 0 10*3/uL (ref 0.0–0.1)
Immature Granulocytes: 0 %
LYMPHS ABS: 2.1 10*3/uL (ref 0.7–3.1)
Lymphs: 35 %
MCH: 30.2 pg (ref 26.6–33.0)
MCHC: 33.4 g/dL (ref 31.5–35.7)
MCV: 90 fL (ref 79–97)
MONOCYTES: 11 %
Monocytes Absolute: 0.7 10*3/uL (ref 0.1–0.9)
Neutrophils Absolute: 3.1 10*3/uL (ref 1.4–7.0)
Neutrophils: 51 %
Platelets: 283 10*3/uL (ref 150–379)
RBC: 4.6 x10E6/uL (ref 3.77–5.28)
RDW: 13.5 % (ref 12.3–15.4)
WBC: 6 10*3/uL (ref 3.4–10.8)

## 2015-10-30 LAB — COMPREHENSIVE METABOLIC PANEL
ALBUMIN: 4.2 g/dL (ref 3.5–4.8)
ALK PHOS: 100 IU/L (ref 39–117)
ALT: 12 IU/L (ref 0–32)
AST: 19 IU/L (ref 0–40)
Albumin/Globulin Ratio: 1.4 (ref 1.2–2.2)
BUN/Creatinine Ratio: 19 (ref 12–28)
BUN: 16 mg/dL (ref 8–27)
Bilirubin Total: 0.5 mg/dL (ref 0.0–1.2)
CO2: 24 mmol/L (ref 18–29)
CREATININE: 0.83 mg/dL (ref 0.57–1.00)
Calcium: 9.8 mg/dL (ref 8.7–10.3)
Chloride: 102 mmol/L (ref 96–106)
GFR calc Af Amer: 80 mL/min/{1.73_m2} (ref >59)
GFR calc non Af Amer: 69 mL/min/{1.73_m2} (ref >59)
GLUCOSE: 101 mg/dL — AB (ref 65–99)
Globulin, Total: 2.9 g/dL (ref 1.5–4.5)
Potassium: 4.7 mmol/L (ref 3.5–5.2)
Sodium: 142 mmol/L (ref 134–144)
Total Protein: 7.1 g/dL (ref 6.0–8.5)

## 2015-10-30 LAB — LIPID PANEL
CHOLESTEROL TOTAL: 254 mg/dL — AB (ref 100–199)
Chol/HDL Ratio: 3.5 ratio units (ref 0.0–4.4)
HDL: 72 mg/dL (ref >39)
LDL CALC: 159 mg/dL — AB (ref 0–99)
TRIGLYCERIDES: 113 mg/dL (ref 0–149)
VLDL CHOLESTEROL CAL: 23 mg/dL (ref 5–40)

## 2015-10-30 LAB — TSH: TSH: 1.05 u[IU]/mL (ref 0.450–4.500)

## 2015-11-04 ENCOUNTER — Encounter: Payer: Self-pay | Admitting: Family Medicine

## 2015-11-04 ENCOUNTER — Ambulatory Visit (INDEPENDENT_AMBULATORY_CARE_PROVIDER_SITE_OTHER): Payer: Commercial Managed Care - HMO | Admitting: Family Medicine

## 2015-11-04 VITALS — BP 126/70 | HR 80 | Temp 97.9°F | Resp 16 | Ht 66.0 in | Wt 216.0 lb

## 2015-11-04 DIAGNOSIS — Z0000 Encounter for general adult medical examination without abnormal findings: Secondary | ICD-10-CM

## 2015-11-04 DIAGNOSIS — H269 Unspecified cataract: Secondary | ICD-10-CM | POA: Diagnosis not present

## 2015-11-04 DIAGNOSIS — K219 Gastro-esophageal reflux disease without esophagitis: Secondary | ICD-10-CM

## 2015-11-04 DIAGNOSIS — R32 Unspecified urinary incontinence: Secondary | ICD-10-CM

## 2015-11-04 LAB — POCT URINALYSIS DIPSTICK
BILIRUBIN UA: NEGATIVE
Glucose, UA: NEGATIVE
Ketones, UA: NEGATIVE
Leukocytes, UA: NEGATIVE
NITRITE UA: NEGATIVE
PH UA: 6
PROTEIN UA: NEGATIVE
RBC UA: NEGATIVE
Spec Grav, UA: 1.025
UROBILINOGEN UA: 0.2

## 2015-11-04 MED ORDER — OMEPRAZOLE 20 MG PO CPDR: 20.0000 mg | DELAYED_RELEASE_CAPSULE | Freq: Two times a day (BID) | ORAL | Status: DC

## 2015-11-04 NOTE — Progress Notes (Signed)
Patient: Lauren Lloyd, Female    DOB: 1941/04/23, 75 y.o.   MRN: [id number] Visit Date: 11/04/2015  Today's Provider: Margarita Rana, MD   Chief Complaint  Patient presents with  . Medicare Wellness   Subjective:    Annual wellness visit Lauren Lloyd is a 75 y.o. female. She feels well. She reports exercising 4 days a week. She reports she is sleeping fairly well. 10/06/14 AWE 11/12/14 Mammogram-BI-RADS 1 08/06/13 BMD-Normal 02/18/14 Colonoscopy-WNL ----------------------------------------------------------- Patient c/o incontinence for several months. Patient denies painful urination or blood is urine. Patient reports that her symptoms are worse through out the day. Patient requesting a referral to urology.  Patient requesting referral to [facility] due to cataracts.   Review of Systems  Constitutional: Negative.   HENT: Negative.   Eyes: Positive for visual disturbance.  Respiratory: Negative.   Cardiovascular: Negative.   Gastrointestinal: Negative.   Endocrine: Negative.   Genitourinary: Positive for enuresis.  Musculoskeletal: Negative.   Skin: Negative.   Allergic/Immunologic: Negative.   Neurological: Negative.   Hematological: Negative.   Psychiatric/Behavioral: Positive for sleep disturbance.    Social History   Social History  . Marital Status: Single    Spouse Name: N/A  . Number of Children: 5  . Years of Education: H/S   Occupational History  . Part Time    Social History Main Topics  . Smoking status: Former Smoker -- 1.00 packs/day for 30 years    Types: Cigarettes    Quit date: 05/17/1991  . Smokeless tobacco: Never Used  . Alcohol Use: 4.2 oz/week    7 Standard drinks or equivalent per week     Comment: Occasional Glass of Wine.  . Drug Use: No  . Sexual Activity: Not on file   Other Topics Concern  . Not on file   Social History Narrative    History reviewed. No pertinent past medical history.   Patient Active  Problem List   Diagnosis Date Noted  . Upper respiratory infection 06/05/2015  . Pulmonary hypertension ([place]) 04/02/2015  . Chest pain radiating to arm 02/17/2015  . Acid reflux 11/18/2014  . 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 Primary osteoarthritis of one hip 10/03/2011  . L-S radiculopathy 09/29/2011  . Arthritis of knee, degenerative 09/29/2011  . H/O knee surgery 09/29/2011  . History of knee surgery 09/29/2011  . Non-toxic uninodular goiter 06/20/2009  . Cervical pain 08/20/2008  . Cannot sleep 12/12/2007  . Hypercholesteremia 07/30/2007    Past Surgical History  Procedure Laterality Date  . Hammer toe surgery  2010  . Skin graft Left 04/08/2013    Done on left index finger  . Replacement total knee Right 2009    [facility]  . Knee arthroscopy Right 2004  . Tonsillectomy  1946    Her family history includes Breast cancer (age of onset: 47) in her sister; Healthy in her brother; Heart attack in her father; Hyperlipidemia in her sister and sister; Transient ischemic attack in her mother.    Current Meds  Medication Sig  . Multiple Vitamin (MULTIVITAMIN) capsule Take 1 capsule by mouth daily.  [name] Kitchen omeprazole (PRILOSEC) 20 MG capsule Take 1 capsule (20 mg total) by mouth 2 times daily at 12 noon and 4 pm.    Patient Care Team: Margarita Rana, MD as PCP - General (Family Medicine)    Objective:   Vitals: BP 126/70 mmHg  Pulse 80  Temp(Src) 97.9 F (36.6 C) (Oral)  Resp 16  Wt 216 lb (97.977 kg)  Physical Exam  Constitutional: She is oriented to person, place, and time. She appears well-developed and well-nourished.  HENT:  Head: Normocephalic and atraumatic.  Right Ear: Tympanic membrane, external ear and ear canal normal.  Left Ear: Tympanic membrane, external ear and ear canal normal.  Nose: Nose normal.  Mouth/Throat: Uvula is midline, oropharynx is clear and moist and mucous membranes are normal.  Eyes: Conjunctivae, EOM and lids are normal.  Pupils are equal, round, and reactive to light.  Neck: Trachea normal and normal range of motion. Neck supple. Carotid bruit is not present. No thyroid mass and no thyromegaly present.  Cardiovascular: Normal rate, regular rhythm and normal heart sounds.   Pulmonary/Chest: Effort normal and breath sounds normal.  Abdominal: Soft. Normal appearance and bowel sounds are normal. There is no hepatosplenomegaly. There is no tenderness.  Genitourinary: No breast swelling, tenderness or discharge.  Musculoskeletal: Normal range of motion.  Lymphadenopathy:    She has no cervical adenopathy.    She has no axillary adenopathy.  Neurological: She is alert and oriented to person, place, and time. She has normal strength. No cranial nerve deficit.  Skin: Skin is warm, dry and intact.  Psychiatric: She has a normal mood and affect. Her speech is normal and behavior is normal. Judgment and thought content normal. Cognition and memory are normal.    Activities of Daily Living In your present state of health, do you have any difficulty performing the following activities: 11/04/2015 06/11/2015  Hearing? N Y  Vision? Y N  Difficulty concentrating or making decisions? N N  Walking or climbing stairs? N N  Dressing or bathing? N N  Doing errands, shopping? N N    Fall Risk Assessment Fall Risk  11/04/2015 06/11/2015  Falls in the past year? Yes No  Number falls in past yr: 2 or more -  Injury with Fall? No -     Depression Screen PHQ 2/9 Scores 11/04/2015 06/11/2015  PHQ - 2 Score 0 0    Cognitive Testing - 6-CIT  Correct? Score   What year is it? yes 0 0 or 4  What month is it? yes 0 0 or 3  Memorize:    Pia Mau,  41,  [address],  [place],      What time is it? (within 1 hour) yes 0 0 or 3  Count backwards from 20 yes 0 0, 2, or 4  Name the months of the year yes 0 0, 2, or 4  Repeat name & address above no 2 0, 2, 4, 6, 8, or 10       TOTAL SCORE  2/28   Interpretation:  Normal  Normal  (0-7) Abnormal (8-28)       Assessment & Plan:     Annual Wellness Visit  Reviewed patient's Family Medical History Reviewed and updated list of patient's medical providers Assessment of cognitive impairment was done Assessed patient's functional ability Established a written schedule for health screening services Health Risk Assessment Completed and Reviewed  Exercise Activities and Dietary recommendations Goals    None      Immunization History  Administered Date(s) Administered  . Hepatitis A 11/25/1999, 09/19/2001  . IPV 02/11/2000  . Pneumococcal Conjugate-13 10/06/2014  . Pneumococcal Polysaccharide-23 12/01/2010  . Td 02/02/1998  . Tdap 09/11/2007, 02/27/2012  . Typhoid Inactivated 02/11/2000   ------------------------------------------------------------------------------------------------------------  1. Medicare annual wellness visit, subsequent Stable. Eat healthy and exercise. Wellness as  above.   2. Gastroesophageal reflux disease without esophagitis Refill medication.  Call GI regarding timing of medication. Not sure if 12 and 4 a typo.   - omeprazole (PRILOSEC) 20 MG capsule; Take 1 capsule (20 mg total) by mouth 2 times daily at 12 noon and 4 pm.  Dispense: 180 capsule; Refill: 3  3. Incontinence Will refer to urology.   - POCT urinalysis dipstick - Ambulatory referral to Urology  4. Cataract Will refer.   - Ambulatory referral to Ophthalmology   Patient seen and examined by Dr. Jerrell Belfast, and note scribed by Philbert Riser. Dim[place], CMA.   I have reviewed the document for accuracy and completeness and I agree with above. - Jerrell Belfast, MD     Margarita Rana, MD  [facility]

## 2015-11-04 NOTE — Patient Instructions (Signed)
Fall Prevention in the Home  Falls can cause injuries and can affect people from all age groups. There are many simple things that you can do to make your home safe and to help prevent falls. WHAT CAN I DO ON THE OUTSIDE OF MY HOME?  Regularly repair the edges of walkways and driveways and fix any cracks.  Remove high doorway thresholds.  Trim any shrubbery on the main path into your home.  Use bright outdoor lighting.  Clear walkways of debris and clutter, including tools and rocks.  Regularly check that handrails are securely fastened and in good repair. Both sides of any steps should have handrails.  Install guardrails along the edges of any raised decks or porches.  Have leaves, snow, and ice cleared regularly.  Use sand or salt on walkways during winter months.  In the garage, clean up any spills right away, including grease or oil spills. WHAT CAN I DO IN THE BATHROOM?  Use night lights.  Install grab bars by the toilet and in the tub and shower. Do not use towel bars as grab bars.  Use non-skid mats or decals on the floor of the tub or shower.  If you need to sit down while you are in the shower, use a plastic, non-slip stool..  Keep the floor dry. Immediately clean up any water that spills on the floor.  Remove soap buildup in the tub or shower on a regular basis.  Attach bath mats securely with double-sided non-slip rug tape.  Remove throw rugs and other tripping hazards from the floor. WHAT CAN I DO IN THE BEDROOM?  Use night lights.  Make sure that a bedside light is easy to reach.  Do not use oversized bedding that drapes onto the floor.  Have a firm chair that has side arms to use for getting dressed.  Remove throw rugs and other tripping hazards from the floor. WHAT CAN I DO IN THE KITCHEN?   Clean up any spills right away.  Avoid walking on wet floors.  Place frequently used items in easy-to-reach places.  If you need to reach for something  above you, use a sturdy step stool that has a grab bar.  Keep electrical cables out of the way.  Do not use floor polish or wax that makes floors slippery. If you have to use wax, make sure that it is non-skid floor wax.  Remove throw rugs and other tripping hazards from the floor. WHAT CAN I DO IN THE STAIRWAYS?  Do not leave any items on the stairs.  Make sure that there are handrails on both sides of the stairs. Fix handrails that are broken or loose. Make sure that handrails are as long as the stairways.  Check any carpeting to make sure that it is firmly attached to the stairs. Fix any carpet that is loose or worn.  Avoid having throw rugs at the top or bottom of stairways, or secure the rugs with carpet tape to prevent them from moving.  Make sure that you have a light switch at the top of the stairs and the bottom of the stairs. If you do not have them, have them installed. WHAT ARE SOME OTHER FALL PREVENTION TIPS?  Wear closed-toe shoes that fit well and support your feet. Wear shoes that have rubber soles or low heels.  When you use a stepladder, make sure that it is completely opened and that the sides are firmly locked. Have someone hold the ladder while you   are using it. Do not climb a closed stepladder.  Add color or contrast paint or tape to grab bars and handrails in your home. Place contrasting color strips on the first and last steps.  Use mobility aids as needed, such as canes, walkers, scooters, and crutches.  Turn on lights if it is dark. Replace any light bulbs that burn out.  Set up furniture so that there are clear paths. Keep the furniture in the same spot.  Fix any uneven floor surfaces.  Choose a carpet design that does not hide the edge of steps of a stairway.  Be aware of any and all pets.  Review your medicines with your healthcare provider. Some medicines can cause dizziness or changes in blood pressure, which increase your risk of falling. Talk  with your health care provider about other ways that you can decrease your risk of falls. This may include working with a physical therapist or trainer to improve your strength, balance, and endurance.   This information is not intended to replace advice given to you by your health care provider. Make sure you discuss any questions you have with your health care provider.   Document Released: 04/22/2002 Document Revised: 09/16/2014 Document Reviewed: 06/06/2014 Elsevier Interactive Patient Education 2016 [employer]  

## 2015-11-10 DIAGNOSIS — H2513 Age-related nuclear cataract, bilateral: Secondary | ICD-10-CM | POA: Diagnosis not present

## 2015-12-01 ENCOUNTER — Telehealth: Payer: Self-pay | Admitting: Family Medicine

## 2015-12-01 NOTE — Telephone Encounter (Signed)
No message needed.  Parke Poisson assisted with Referral

## 2015-12-07 ENCOUNTER — Ambulatory Visit: Payer: Self-pay | Admitting: Urology

## 2015-12-08 ENCOUNTER — Other Ambulatory Visit: Payer: Self-pay | Admitting: Family Medicine

## 2015-12-08 ENCOUNTER — Ambulatory Visit: Payer: Self-pay | Admitting: Urology

## 2015-12-08 DIAGNOSIS — Z1231 Encounter for screening mammogram for malignant neoplasm of breast: Secondary | ICD-10-CM

## 2015-12-10 ENCOUNTER — Ambulatory Visit: Payer: Self-pay | Admitting: Urology

## 2015-12-16 ENCOUNTER — Encounter: Payer: Self-pay | Admitting: Urology

## 2015-12-16 ENCOUNTER — Ambulatory Visit (INDEPENDENT_AMBULATORY_CARE_PROVIDER_SITE_OTHER): Payer: Commercial Managed Care - HMO | Admitting: Urology

## 2015-12-16 VITALS — BP 141/82 | HR 73 | Ht 67.0 in | Wt 217.2 lb

## 2015-12-16 DIAGNOSIS — N3946 Mixed incontinence: Secondary | ICD-10-CM

## 2015-12-16 NOTE — Progress Notes (Signed)
12/16/2015 3:24 PM   Lauren Lloyd [birth year] [id number]  Referring provider: Margarita Rana, MD [address] [place], [place] [address]  Chief Complaint  Patient presents with  . New Patient (Initial Visit)    incontinence    HPI: In the last 6 months patient has worsening urge incontinence. She denies stress incontinence that may rarely leak and she bends. She does not wear pads. She is up once or twice a night. She voids every 2-3 hours during the daytime.  She denies history of previous GU surgery kidney stones. She has no neurologic issues. She's had a hysterectomy. She is prone to constipation. A presentation of medically treated  Modifying factors: There are no other modifying factors  Associated signs and symptoms: There are no other associated signs and symptoms Aggravating and relieving factors: There are no other aggravating or relieving factors Severity: Mild Duration: Persistent     PMH: No past medical history on file.  Surgical History: Past Surgical History:  Procedure Laterality Date  . [place]  2010  . KNEE ARTHROSCOPY Right 2004  . REPLACEMENT TOTAL KNEE Right 2009   [facility]  . SKIN GRAFT Left 04/08/2013   Done on left index finger  . TONSILLECTOMY  1946    Home Medications:    Medication List       Accurate as of 12/16/15  3:24 PM. Always use your most recent med list.          METAMUCIL FIBER PO Take by mouth.   multivitamin capsule Take 1 capsule by mouth daily.   omeprazole 20 MG capsule Commonly known as:  PRILOSEC Take 1 capsule (20 mg total) by mouth 2 times daily at 12 noon and 4 pm.       Allergies:  Allergies  Allergen Reactions  . Levofloxacin     Other reaction(s): Other (See Comments) Other Reaction: OTHER REACTION Other reaction(s): Joint Pains  . Iodine     Other reaction(s): Unknown    Family History: Family History  Problem Relation Age of Onset  .  Hyperlipidemia Sister   . Transient ischemic attack Mother   . Heart attack Father   . Healthy Brother   . Breast cancer Sister 38  . Hyperlipidemia Sister     Social History:  reports that she quit smoking about 24 years ago. Her smoking use included Cigarettes. She has a 30.00 pack-year smoking history. She has never used smokeless tobacco. She reports that she drinks about 4.2 oz of alcohol per week . She reports that she does not use drugs.  ROS:                                        Physical Exam: BP (!) 141/82   Pulse 73   Ht 5\' 7"  (1.702 m)   Wt 217 lb 3.2 oz (98.5 kg)   BMI 34.02 kg/m   Constitutional:  Alert and oriented, No acute distress. HEENT: [place] AT, moist mucus membranes.  Trachea midline, no masses. Cardiovascular: No clubbing, cyanosis, or edema. Respiratory: Normal respiratory effort, no increased work of breathing. GI: Abdomen is soft, nontender, nondistended, no abdominal masses GU: No CVA tenderness. Good bladder support no stress incontinence Skin: No rashes, bruises or suspicious lesions. Lymph: No cervical or inguinal adenopathy. Neurologic: Grossly intact, no focal deficits, moving all 4 extremities. Psychiatric: Normal mood and  affect.  Laboratory Data: Lab Results  Component Value Date   WBC 6.0 10/29/2015   HCT 41.6 10/29/2015   MCV 90 10/29/2015   PLT 283 10/29/2015    Lab Results  Component Value Date   CREATININE 0.83 10/29/2015    No results found for: PSA  No results found for: TESTOSTERONE  No results found for: HGBA1C  Urinalysis    Component Value Date/Time   BILIRUBINUR neg 11/04/2015 1444   PROTEINUR neg 11/04/2015 1444   UROBILINOGEN 0.2 11/04/2015 1444   NITRITE neg 11/04/2015 1444   LEUKOCYTESUR Negative 11/04/2015 1444    Pertinent Imaging: None  Assessment & Plan:  Clinically the patient has mild mixed stress urge incontinence but primarily an overactive bladder. She is still working  some in the travel agent business. The role of physical therapy discussed. Role of medication discussed.  The patient shows a physical therapy consultation and I will see her as necessary  Urgency incontinence Urinary frequency  There are no diagnoses linked to this encounter.  No Follow-up on file.  Reece Packer, MD  [facility] [address], [place], [place] [address] [phone]

## 2016-01-08 DIAGNOSIS — H2513 Age-related nuclear cataract, bilateral: Secondary | ICD-10-CM | POA: Diagnosis not present

## 2016-01-11 ENCOUNTER — Other Ambulatory Visit: Payer: Self-pay | Admitting: Family Medicine

## 2016-01-11 ENCOUNTER — Ambulatory Visit
Admission: RE | Admit: 2016-01-11 | Discharge: 2016-01-11 | Disposition: A | Discharge status: Home / Self Care | Payer: Commercial Managed Care - HMO | Source: Ambulatory Visit | Attending: Family Medicine | Admitting: Family Medicine

## 2016-01-11 DIAGNOSIS — Z1231 Encounter for screening mammogram for malignant neoplasm of breast: Secondary | ICD-10-CM | POA: Diagnosis not present

## 2016-01-14 ENCOUNTER — Encounter: Payer: Self-pay | Admitting: *Deleted

## 2016-01-22 NOTE — Discharge Instructions (Signed)

## 2016-01-25 ENCOUNTER — Ambulatory Visit
Admission: RE | Admit: 2016-01-25 | Discharge: 2016-01-25 | Disposition: A | Discharge status: Home / Self Care | Payer: Commercial Managed Care - HMO | Source: Ambulatory Visit | Attending: Ophthalmology | Admitting: Ophthalmology

## 2016-01-25 ENCOUNTER — Ambulatory Visit: Payer: Commercial Managed Care - HMO | Admitting: Anesthesiology

## 2016-01-25 ENCOUNTER — Encounter
Admission: RE | Disposition: A | Discharge status: Home / Self Care | Payer: Self-pay | Source: Ambulatory Visit | Attending: Ophthalmology

## 2016-01-25 DIAGNOSIS — I10 Essential (primary) hypertension: Secondary | ICD-10-CM | POA: Diagnosis not present

## 2016-01-25 DIAGNOSIS — Z881 Allergy status to other antibiotic agents status: Secondary | ICD-10-CM | POA: Insufficient documentation

## 2016-01-25 DIAGNOSIS — Z885 Allergy status to narcotic agent status: Secondary | ICD-10-CM | POA: Insufficient documentation

## 2016-01-25 DIAGNOSIS — K219 Gastro-esophageal reflux disease without esophagitis: Secondary | ICD-10-CM | POA: Insufficient documentation

## 2016-01-25 DIAGNOSIS — Z79899 Other long term (current) drug therapy: Secondary | ICD-10-CM | POA: Diagnosis not present

## 2016-01-25 DIAGNOSIS — Z87891 Personal history of nicotine dependence: Secondary | ICD-10-CM | POA: Diagnosis not present

## 2016-01-25 DIAGNOSIS — H2511 Age-related nuclear cataract, right eye: Secondary | ICD-10-CM | POA: Diagnosis not present

## 2016-01-25 DIAGNOSIS — H2513 Age-related nuclear cataract, bilateral: Secondary | ICD-10-CM | POA: Diagnosis not present

## 2016-01-25 HISTORY — DX: Gastro-esophageal reflux disease without esophagitis: K21.9

## 2016-01-25 HISTORY — DX: Unspecified osteoarthritis, unspecified site: M19.90

## 2016-01-25 HISTORY — PX: CATARACT EXTRACTION W/PHACO: SHX586

## 2016-01-25 SURGERY — PHACOEMULSIFICATION, CATARACT, WITH IOL INSERTION
Anesthesia: Monitor Anesthesia Care | Site: Eye | Laterality: Right | Wound class: Clean

## 2016-01-25 MED ORDER — TIMOLOL MALEATE 0.5 % OP SOLN
OPHTHALMIC | Status: DC | PRN
  Administered 2016-01-25: 1 [drp] via OPHTHALMIC

## 2016-01-25 MED ORDER — POVIDONE-IODINE 5 % OP SOLN
1.0000 | OPHTHALMIC | Status: DC | PRN
Start: 2016-01-25 — End: 2016-01-25
  Administered 2016-01-25: 1 via OPHTHALMIC

## 2016-01-25 MED ORDER — LACTATED RINGERS IV SOLN: INTRAVENOUS | Status: DC

## 2016-01-25 MED ORDER — FENTANYL CITRATE (PF) 100 MCG/2ML IJ SOLN
INTRAMUSCULAR | Status: DC | PRN
  Administered 2016-01-25 (×2): 50 ug via INTRAVENOUS

## 2016-01-25 MED ORDER — ACETAMINOPHEN 325 MG PO TABS: 325.0000 mg | ORAL_TABLET | ORAL | Status: DC | PRN

## 2016-01-25 MED ORDER — EPINEPHRINE HCL 1 MG/ML IJ SOLN
INTRAOCULAR | Status: DC | PRN
  Administered 2016-01-25: 131 mL via OPHTHALMIC

## 2016-01-25 MED ORDER — BRIMONIDINE TARTRATE 0.2 % OP SOLN
OPHTHALMIC | Status: DC | PRN
  Administered 2016-01-25: 1 [drp] via OPHTHALMIC

## 2016-01-25 MED ORDER — LIDOCAINE HCL (PF) 4 % IJ SOLN
INTRAOCULAR | Status: DC | PRN
  Administered 2016-01-25: 1 mL via OPHTHALMIC

## 2016-01-25 MED ORDER — ACETAMINOPHEN 160 MG/5ML PO SOLN: 325.0000 mg | ORAL | Status: DC | PRN

## 2016-01-25 MED ORDER — TETRACAINE HCL 0.5 % OP SOLN
1.0000 [drp] | OPHTHALMIC | Status: DC | PRN
  Administered 2016-01-25: 1 [drp] via OPHTHALMIC

## 2016-01-25 MED ORDER — NA HYALUR & NA CHOND-NA HYALUR 0.4-0.35 ML IO KIT
PACK | INTRAOCULAR | Status: DC | PRN
  Administered 2016-01-25: 1 mL via INTRAOCULAR

## 2016-01-25 MED ORDER — MIDAZOLAM HCL 2 MG/2ML IJ SOLN
INTRAMUSCULAR | Status: DC | PRN
  Administered 2016-01-25: 0.5 mg via INTRAVENOUS
  Administered 2016-01-25: 1.5 mg via INTRAVENOUS

## 2016-01-25 MED ORDER — ARMC OPHTHALMIC DILATING GEL
1.0000 "application " | OPHTHALMIC | Status: DC | PRN
  Administered 2016-01-25 (×2): 1 via OPHTHALMIC

## 2016-01-25 MED ORDER — CEFUROXIME OPHTHALMIC INJECTION 1 MG/0.1 ML
INJECTION | OPHTHALMIC | Status: DC | PRN
  Administered 2016-01-25: 0.1 mL via INTRACAMERAL

## 2016-01-25 SURGICAL SUPPLY — 29 items
APPLICATOR COTTON TIP 3IN (MISCELLANEOUS) ×2 IMPLANT
CANNULA ANT/CHMB 27GA (MISCELLANEOUS) ×2 IMPLANT
DISSECTOR HYDRO NUCLEUS 50X22 (MISCELLANEOUS) ×2 IMPLANT
GLOVE BIO SURGEON STRL SZ7 (GLOVE) ×2 IMPLANT
GLOVE SURG LX 6.5 MICRO (GLOVE) ×1
GLOVE SURG LX STRL 6.5 MICRO (GLOVE) ×1 IMPLANT
GOWN STRL REUS W/ TWL LRG LVL3 (GOWN DISPOSABLE) ×2 IMPLANT
GOWN STRL REUS W/TWL LRG LVL3 (GOWN DISPOSABLE) ×2
LENS IOL ACRSF IQ ULTRA 23.5 (Intraocular Lens) ×1 IMPLANT
LENS IOL ACRYSOF IQ 23.5 (Intraocular Lens) ×2 IMPLANT
MARKER SKIN DUAL TIP RULER LAB (MISCELLANEOUS) ×2 IMPLANT
NEEDLE FILTER BLUNT 18X 1/2SAF (NEEDLE) ×2
NEEDLE FILTER BLUNT 18X1 1/2 (NEEDLE) ×2 IMPLANT
PACK CATARACT BRASINGTON (MISCELLANEOUS) ×2 IMPLANT
PACK EYE AFTER SURG (MISCELLANEOUS) ×2 IMPLANT
PACK OPTHALMIC (MISCELLANEOUS) ×2 IMPLANT
RING MALYGIN 7.0 (MISCELLANEOUS) IMPLANT
SOL BAL SALT 15ML (MISCELLANEOUS)
SOLUTION BAL SALT 15ML (MISCELLANEOUS) IMPLANT
SUT ETHILON 10-0 CS-B-6CS-B-6 (SUTURE)
SUT VICRYL  9 0 (SUTURE)
SUT VICRYL 9 0 (SUTURE) IMPLANT
SUTURE EHLN 10-0 CS-B-6CS-B-6 (SUTURE) IMPLANT
SYR 3ML LL SCALE MARK (SYRINGE) ×4 IMPLANT
SYR TB 1ML LUER SLIP (SYRINGE) ×2 IMPLANT
WATER STERILE IRR 250ML POUR (IV SOLUTION) ×2 IMPLANT
WATER STERILE IRR 500ML POUR (IV SOLUTION) IMPLANT
WICK EYE OCUCEL (MISCELLANEOUS) IMPLANT
WIPE NON LINTING 3.25X3.25 (MISCELLANEOUS) ×2 IMPLANT

## 2016-01-25 NOTE — Anesthesia Procedure Notes (Signed)
Procedure Name: MAC Performed by: Sanyiah Kanzler Pre-anesthesia Checklist: Patient identified, Emergency Drugs available, Suction available, Timeout performed and Patient being monitored Patient Re-evaluated:Patient Re-evaluated prior to inductionOxygen Delivery Method: Nasal cannula Placement Confirmation: positive ETCO2       

## 2016-01-25 NOTE — Op Note (Signed)
Date of Surgery: 01/25/2016  PREOPERATIVE DIAGNOSES: Visually significant nuclear sclerotic cataract, right eye.  POSTOPERATIVE DIAGNOSES: Same  PROCEDURES PERFORMED: Cataract extraction with intraocular lens implant, right eye.  SURGEON: Almon Hercules, M.D.  ANESTHESIA: MAC and topical  IMPLANTS: AcrySof IQ [id number] +23.5 D  Implant Name Type Inv. Item Serial No. Manufacturer Lot No. LRB No. Used  LENS IOL ACRYSOF IQ 23.5 - [id number] Intraocular Lens LENS IOL ACRYSOF IQ 23.5 [id number] ALCON   Right 1     COMPLICATIONS: None.  DESCRIPTION OF PROCEDURE: Therapeutic options were discussed with the patient preoperatively, including a discussion of risks and benefits of surgery. Informed consent was obtained. An IOL-Master and immersion biometry were used to take the lens measurements, and a dilated fundus exam was performed within 6 months of the surgical date.  The patient was premedicated and brought to the operating room and placed on the operating table in the supine position. After adequate anesthesia, the patient was prepped and draped in the usual sterile ophthalmic fashion. A wire lid speculum was inserted and the microscope was positioned. A Superblade was used to create a paracentesis site at the limbus and a small amount of dilute preservative free lidocaine was instilled into the anterior chamber, followed by dispersive viscoelastic. A clear corneal incision was created temporally using a 2.4 mm keratome blade. Capsulorrhexis was then performed. In situ phacoemulsification was performed.  Cortical material was removed with the irrigation-aspiration unit. Dispersive viscoelastic was instilled to open the capsular bag. A posterior chamber intraocular lens with the specifications above was inserted and positioned. Irrigation-aspiration was used to remove all viscoelastic. Cefuroxime 1cc was instilled into the anterior chamber, and the corneal incision was checked and found to be  water tight. The eyelid speculum was removed.  The operative eye was covered with protective goggles after instilling 1 drop of timolol and brimonidine. The patient tolerated the procedure well. There were no complications.

## 2016-01-25 NOTE — Anesthesia Postprocedure Evaluation (Signed)
Anesthesia Post Note  Patient: Lauren Lloyd  Procedure(s) Performed: Procedure(s) (LRB): CATARACT EXTRACTION PHACO AND INTRAOCULAR LENS PLACEMENT (IOC) (Right)  Patient location during evaluation: PACU Anesthesia Type: MAC Level of consciousness: awake and alert and oriented Pain management: satisfactory to patient Vital Signs Assessment: post-procedure vital signs reviewed and stable Respiratory status: spontaneous breathing, nonlabored ventilation and respiratory function stable Cardiovascular status: blood pressure returned to baseline and stable Postop Assessment: Adequate PO intake and No signs of nausea or vomiting Anesthetic complications: no    Raliegh Ip

## 2016-01-25 NOTE — Anesthesia Preprocedure Evaluation (Signed)
Anesthesia Evaluation  Patient identified by MRN, date of birth, ID band Patient awake    Reviewed: Allergy & Precautions, H&P , NPO status , Patient's Chart, lab work & pertinent test results  Airway Mallampati: II  TM Distance: >3 FB Neck ROM: full    Dental no notable dental hx.    Pulmonary former smoker,    Pulmonary exam normal        Cardiovascular Normal cardiovascular exam     Neuro/Psych    GI/Hepatic GERD  ,  Endo/Other    Renal/GU      Musculoskeletal   Abdominal   Peds  Hematology   Anesthesia Other Findings   Reproductive/Obstetrics                             Anesthesia Physical Anesthesia Plan  ASA: II  Anesthesia Plan: MAC   Post-op Pain Management:    Induction:   Airway Management Planned:   Additional Equipment:   Intra-op Plan:   Post-operative Plan:   Informed Consent: I have reviewed the patients History and Physical, chart, labs and discussed the procedure including the risks, benefits and alternatives for the proposed anesthesia with the patient or authorized representative who has indicated his/her understanding and acceptance.     Plan Discussed with:   Anesthesia Plan Comments:         Anesthesia Quick Evaluation  

## 2016-01-25 NOTE — H&P (Signed)
H+P reviewed and is up to date, please see paper chart.  

## 2016-01-25 NOTE — Transfer of Care (Signed)
Immediate Anesthesia Transfer of Care Note  Patient: Lauren Lloyd  Procedure(s) Performed: Procedure(s) with comments: CATARACT EXTRACTION PHACO AND INTRAOCULAR LENS PLACEMENT (IOC) (Right) - RIGHT  Patient Location: PACU  Anesthesia Type: MAC  Level of Consciousness: awake, alert  and patient cooperative  Airway and Oxygen Therapy: Patient Spontanous Breathing and Patient connected to supplemental oxygen  Post-op Assessment: Post-op Vital signs reviewed, Patient's Cardiovascular Status Stable, Respiratory Function Stable, Patent Airway and No signs of Nausea or vomiting  Post-op Vital Signs: Reviewed and stable  Complications: No apparent anesthesia complications

## 2016-02-18 ENCOUNTER — Encounter: Payer: Self-pay | Admitting: *Deleted

## 2016-02-18 DIAGNOSIS — H2513 Age-related nuclear cataract, bilateral: Secondary | ICD-10-CM | POA: Diagnosis not present

## 2016-02-18 NOTE — Discharge Instructions (Signed)

## 2016-02-22 ENCOUNTER — Ambulatory Visit
Admission: RE | Admit: 2016-02-22 | Discharge: 2016-02-22 | Disposition: A | Discharge status: Home / Self Care | Payer: Commercial Managed Care - HMO | Source: Ambulatory Visit | Attending: Ophthalmology | Admitting: Ophthalmology

## 2016-02-22 ENCOUNTER — Encounter
Admission: RE | Disposition: A | Discharge status: Home / Self Care | Payer: Self-pay | Source: Ambulatory Visit | Attending: Ophthalmology

## 2016-02-22 ENCOUNTER — Ambulatory Visit: Payer: Commercial Managed Care - HMO | Admitting: Anesthesiology

## 2016-02-22 DIAGNOSIS — I10 Essential (primary) hypertension: Secondary | ICD-10-CM | POA: Insufficient documentation

## 2016-02-22 DIAGNOSIS — Z87891 Personal history of nicotine dependence: Secondary | ICD-10-CM | POA: Insufficient documentation

## 2016-02-22 DIAGNOSIS — Z79899 Other long term (current) drug therapy: Secondary | ICD-10-CM | POA: Insufficient documentation

## 2016-02-22 DIAGNOSIS — K219 Gastro-esophageal reflux disease without esophagitis: Secondary | ICD-10-CM | POA: Diagnosis not present

## 2016-02-22 DIAGNOSIS — H2512 Age-related nuclear cataract, left eye: Secondary | ICD-10-CM | POA: Diagnosis not present

## 2016-02-22 HISTORY — PX: CATARACT EXTRACTION W/PHACO: SHX586

## 2016-02-22 SURGERY — PHACOEMULSIFICATION, CATARACT, WITH IOL INSERTION
Anesthesia: Monitor Anesthesia Care | Laterality: Left | Wound class: Clean

## 2016-02-22 MED ORDER — ACETAMINOPHEN 325 MG PO TABS
650.0000 mg | ORAL_TABLET | Freq: Once | ORAL | Status: AC
  Administered 2016-02-22: 650 mg via ORAL

## 2016-02-22 MED ORDER — BRIMONIDINE TARTRATE 0.2 % OP SOLN
OPHTHALMIC | Status: DC | PRN
  Administered 2016-02-22: 1 [drp] via OPHTHALMIC

## 2016-02-22 MED ORDER — MIDAZOLAM HCL 2 MG/2ML IJ SOLN
INTRAMUSCULAR | Status: DC | PRN
  Administered 2016-02-22: 2 mg via INTRAVENOUS

## 2016-02-22 MED ORDER — LIDOCAINE HCL (PF) 4 % IJ SOLN
INTRAMUSCULAR | Status: DC | PRN
  Administered 2016-02-22: 1 mL via OPHTHALMIC

## 2016-02-22 MED ORDER — LACTATED RINGERS IV SOLN: INTRAVENOUS | Status: DC

## 2016-02-22 MED ORDER — POVIDONE-IODINE 5 % OP SOLN
OPHTHALMIC | Status: DC | PRN
  Administered 2016-02-22: 1 via OPHTHALMIC

## 2016-02-22 MED ORDER — NA HYALUR & NA CHOND-NA HYALUR 0.4-0.35 ML IO KIT
PACK | INTRAOCULAR | Status: DC | PRN
  Administered 2016-02-22: 1 mL via INTRAOCULAR

## 2016-02-22 MED ORDER — CEFUROXIME OPHTHALMIC INJECTION 1 MG/0.1 ML
INJECTION | OPHTHALMIC | Status: DC | PRN
  Administered 2016-02-22: 0.1 mL via INTRACAMERAL

## 2016-02-22 MED ORDER — MOXIFLOXACIN HCL 0.5 % OP SOLN
1.0000 [drp] | OPHTHALMIC | Status: DC | PRN
  Administered 2016-02-22 (×3): 1 [drp] via OPHTHALMIC

## 2016-02-22 MED ORDER — ARMC OPHTHALMIC DILATING DROPS
1.0000 | OPHTHALMIC | Status: DC | PRN
Start: 2016-02-22 — End: 2016-02-22
  Administered 2016-02-22 (×3): 1 via OPHTHALMIC

## 2016-02-22 MED ORDER — TIMOLOL MALEATE 0.5 % OP SOLN
OPHTHALMIC | Status: DC | PRN
  Administered 2016-02-22: 1 [drp] via OPHTHALMIC

## 2016-02-22 MED ORDER — BSS IO SOLN
INTRAOCULAR | Status: DC | PRN
  Administered 2016-02-22: 70 mL via OPHTHALMIC

## 2016-02-22 MED ORDER — FENTANYL CITRATE (PF) 100 MCG/2ML IJ SOLN
INTRAMUSCULAR | Status: DC | PRN
Start: 2016-02-22 — End: 2016-02-22
  Administered 2016-02-22: 50 ug via INTRAVENOUS

## 2016-02-22 SURGICAL SUPPLY — 29 items
APPLICATOR COTTON TIP 3IN (MISCELLANEOUS) ×2 IMPLANT
CANNULA ANT/CHMB 27GA (MISCELLANEOUS) ×2 IMPLANT
DISSECTOR HYDRO NUCLEUS 50X22 (MISCELLANEOUS) ×2 IMPLANT
GLOVE BIO SURGEON STRL SZ7 (GLOVE) ×2 IMPLANT
GLOVE SURG LX 6.5 MICRO (GLOVE) ×1
GLOVE SURG LX STRL 6.5 MICRO (GLOVE) ×1 IMPLANT
GOWN STRL REUS W/ TWL LRG LVL3 (GOWN DISPOSABLE) ×2 IMPLANT
GOWN STRL REUS W/TWL LRG LVL3 (GOWN DISPOSABLE) ×2
LENS IOL ACRSF IQ ULTRA 23.0 (Intraocular Lens) ×1 IMPLANT
LENS IOL ACRYSOF IQ 23.0 (Intraocular Lens) ×2 IMPLANT
MARKER SKIN DUAL TIP RULER LAB (MISCELLANEOUS) ×2 IMPLANT
NEEDLE FILTER BLUNT 18X 1/2SAF (NEEDLE) ×1
NEEDLE FILTER BLUNT 18X1 1/2 (NEEDLE) ×1 IMPLANT
PACK CATARACT BRASINGTON (MISCELLANEOUS) ×2 IMPLANT
PACK EYE AFTER SURG (MISCELLANEOUS) ×2 IMPLANT
PACK OPTHALMIC (MISCELLANEOUS) ×2 IMPLANT
RING MALYGIN 7.0 (MISCELLANEOUS) IMPLANT
SOL BAL SALT 15ML (MISCELLANEOUS)
SOLUTION BAL SALT 15ML (MISCELLANEOUS) IMPLANT
SUT ETHILON 10-0 CS-B-6CS-B-6 (SUTURE)
SUT VICRYL  9 0 (SUTURE)
SUT VICRYL 9 0 (SUTURE) IMPLANT
SUTURE EHLN 10-0 CS-B-6CS-B-6 (SUTURE) IMPLANT
SYR 3ML LL SCALE MARK (SYRINGE) ×2 IMPLANT
SYR TB 1ML LUER SLIP (SYRINGE) ×2 IMPLANT
WATER STERILE IRR 250ML POUR (IV SOLUTION) ×2 IMPLANT
WATER STERILE IRR 500ML POUR (IV SOLUTION) IMPLANT
WICK EYE OCUCEL (MISCELLANEOUS) IMPLANT
WIPE NON LINTING 3.25X3.25 (MISCELLANEOUS) ×2 IMPLANT

## 2016-02-22 NOTE — Transfer of Care (Signed)
Immediate Anesthesia Transfer of Care Note  Patient: Lauren Lloyd  Procedure(s) Performed: Procedure(s) with comments: CATARACT EXTRACTION PHACO AND INTRAOCULAR LENS PLACEMENT (IOC) (Left) - LEFT  Patient Location: PACU  Anesthesia Type: MAC  Level of Consciousness: awake, alert  and patient cooperative  Airway and Oxygen Therapy: Patient Spontanous Breathing and Patient connected to supplemental oxygen  Post-op Assessment: Post-op Vital signs reviewed, Patient's Cardiovascular Status Stable, Respiratory Function Stable, Patent Airway and No signs of Nausea or vomiting  Post-op Vital Signs: Reviewed and stable  Complications: No apparent anesthesia complications

## 2016-02-22 NOTE — Anesthesia Postprocedure Evaluation (Signed)
Anesthesia Post Note  Patient: Lauren Lloyd  Procedure(s) Performed: Procedure(s) (LRB): CATARACT EXTRACTION PHACO AND INTRAOCULAR LENS PLACEMENT (IOC) (Left)  Patient location during evaluation: PACU Anesthesia Type: MAC Level of consciousness: awake and alert Pain management: pain level controlled Vital Signs Assessment: post-procedure vital signs reviewed and stable Respiratory status: spontaneous breathing, nonlabored ventilation, respiratory function stable and patient connected to nasal cannula oxygen Cardiovascular status: stable and blood pressure returned to baseline Anesthetic complications: no    Katesha Eichel

## 2016-02-22 NOTE — H&P (Signed)
H+P reviewed and is up to date, please see paper chart.  

## 2016-02-22 NOTE — Anesthesia Procedure Notes (Signed)
Procedure Name: MAC Performed by: Khylah Kendra Pre-anesthesia Checklist: Patient identified, Emergency Drugs available, Suction available, Timeout performed and Patient being monitored Patient Re-evaluated:Patient Re-evaluated prior to inductionOxygen Delivery Method: Nasal cannula Placement Confirmation: positive ETCO2     

## 2016-02-22 NOTE — Anesthesia Preprocedure Evaluation (Addendum)
Anesthesia Evaluation  Patient identified by MRN, date of birth, ID band Patient awake    Reviewed: Allergy & Precautions, H&P , NPO status , Patient's Chart, lab work & pertinent test results  History of Anesthesia Complications Negative for: history of anesthetic complications  Airway Mallampati: II  TM Distance: >3 FB Neck ROM: full    Dental no notable dental hx.    Pulmonary neg pulmonary ROS, former smoker,  pulm HTN;   Pulmonary exam normal        Cardiovascular Exercise Tolerance: Good negative cardio ROS Normal cardiovascular exam  stress: 02/2015: Exercise myocardial perfusion imaging study with no significant ischemia Normal wall motion, EF estimated at 66% Borderline EKG changes concerning for ischemia at peak exercise. Adequate exercise tolerance. Hypertensive response to exercise.   Low risk scan;  echo: 02/2015: Left ventricle: The cavity size was normal. There was mild focal   basal and mild concentric hypertrophy of the septum. Systolic   function was normal. The estimated ejection fraction was in the   range of 60% to 65%. Wall motion was normal; there were no   regional wall motion abnormalities. Left ventricular diastolic   function parameters were normal. - Left atrium: The atrium was at the upper limits of normal in   size. - Right ventricle: Systolic function was normal. - Pulmonary arteries: Systolic pressure was moderately elevated. PA   peak pressure: 53 mm Hg ;     Neuro/Psych L-S radiculopathy negative psych ROS   GI/Hepatic Neg liver ROS, GERD  ,  Endo/Other  negative endocrine ROS  Renal/GU negative Renal ROS  negative genitourinary   Musculoskeletal  (+) Arthritis ,   Abdominal   Peds  Hematology negative hematology ROS (+)   Anesthesia Other Findings   Reproductive/Obstetrics                            Anesthesia Physical Anesthesia  Plan  ASA: II  Anesthesia Plan: MAC   Post-op Pain Management:    Induction:   Airway Management Planned:   Additional Equipment:   Intra-op Plan:   Post-operative Plan:   Informed Consent: I have reviewed the patients History and Physical, chart, labs and discussed the procedure including the risks, benefits and alternatives for the proposed anesthesia with the patient or authorized representative who has indicated his/her understanding and acceptance.     Plan Discussed with: CRNA  Anesthesia Plan Comments:        Anesthesia Quick Evaluation

## 2016-02-22 NOTE — Op Note (Signed)
Date of Surgery: 02/22/2016  PREOPERATIVE DIAGNOSES: Visually significant nuclear sclerotic cataract, left eye.  POSTOPERATIVE DIAGNOSES: Same  PROCEDURES PERFORMED: Cataract extraction with intraocular lens implant, left eye.  SURGEON: Almon Hercules, M.D.  ANESTHESIA: MAC and topical  IMPLANTS: [id number] +23.0 D   Implant Name Type Inv. Item Serial No. Manufacturer Lot No. LRB No. Used  LENS IOL ACRYSOF IQ 23.0 - [id number] Intraocular Lens LENS IOL ACRYSOF IQ 23.0 [id number] [employer]CON   Left 1    COMPLICATIONS: None.  DESCRIPTION OF PROCEDURE: Therapeutic options were discussed with the patient preoperatively, including a discussion of risks and benefits of surgery. Informed consent was obtained. An IOL-Master and immersion biometry were used to take the lens measurements, and a dilated fundus exam was performed within 6 months of the surgical date.  The patient was premedicated and brought to the operating room and placed on the operating table in the supine position. After adequate anesthesia, the patient was prepped and draped in the usual sterile ophthalmic fashion. A wire lid speculum was inserted and the microscope was positioned. A Superblade was used to create a paracentesis site at the limbus and a small amount of dilute preservative free lidocaine was instilled into the anterior chamber, followed by dispersive viscoelastic. A clear corneal incision was created temporally using a 2.4 mm keratome blade. Capsulorrhexis was then performed. In situ phacoemulsification was performed.  Cortical material was removed with the irrigation-aspiration unit. Dispersive viscoelastic was instilled to open the capsular bag. A posterior chamber intraocular lens with the specifications above was inserted and positioned. Irrigation-aspiration was used to remove all viscoelastic. Cefuroxime 1cc was instilled into the anterior chamber, and the corneal incision was checked and found to be water tight. The  eyelid speculum was removed.  The operative eye was covered with protective goggles after instilling 1 drop of timolol and brimonidine. The patient tolerated the procedure well. There were no complications.

## 2016-02-23 ENCOUNTER — Encounter: Payer: Self-pay | Admitting: Ophthalmology

## 2016-02-25 DIAGNOSIS — J069 Acute upper respiratory infection, unspecified: Secondary | ICD-10-CM | POA: Diagnosis not present

## 2016-03-07 ENCOUNTER — Ambulatory Visit (INDEPENDENT_AMBULATORY_CARE_PROVIDER_SITE_OTHER): Payer: Commercial Managed Care - HMO | Admitting: Family Medicine

## 2016-03-07 ENCOUNTER — Encounter: Payer: Self-pay | Admitting: Family Medicine

## 2016-03-07 VITALS — BP 140/70 | HR 76 | Temp 98.1°F | Resp 16 | Wt 217.0 lb

## 2016-03-07 DIAGNOSIS — R05 Cough: Secondary | ICD-10-CM

## 2016-03-07 DIAGNOSIS — K219 Gastro-esophageal reflux disease without esophagitis: Secondary | ICD-10-CM

## 2016-03-07 DIAGNOSIS — R059 Cough, unspecified: Secondary | ICD-10-CM

## 2016-03-07 MED ORDER — PREDNISONE 10 MG (48) PO TBPK: ORAL_TABLET | Freq: Every day | ORAL | 0 refills | Status: DC

## 2016-03-07 MED ORDER — HYDROCOD POLST-CPM POLST ER 10-8 MG/5ML PO SUER: 5.0000 mL | Freq: Two times a day (BID) | ORAL | 0 refills | Status: DC | PRN

## 2016-03-07 NOTE — Progress Notes (Signed)
Patient: Lauren Lloyd Female    DOB: 07-29-1940   75 y.o.   MRN: [id number] Visit Date: 03/07/2016  Today's Provider: [name], MD   Chief Complaint  Patient presents with  . Gastroesophageal Reflux  . URI   Subjective:    Gastroesophageal Reflux  She complains of coughing (due to URI; causing insomnia), dysphagia and a sore throat. She reports no abdominal pain, no belching, no chest pain, no choking, no early satiety, no globus sensation, no heartburn, no nausea, no water brash or no wheezing. This is a chronic problem. The problem has been gradually improving. Exacerbated by: over eating and over drinking. She has tried a PPI (Omeprazole 20 mg, which was increased by Dr. Richardson Landry (ENT) to BID) for the symptoms. The treatment provided significant relief.  URI   This is a new problem. The current episode started 1 to 4 weeks ago (x 3 weeks). The problem has been unchanged. There has been no fever. Associated symptoms include congestion, coughing (due to URI; causing insomnia), headaches, a plugged ear sensation (left ear), rhinorrhea, sinus pain and a sore throat. Pertinent negatives include no abdominal pain, chest pain, ear pain, nausea, neck pain, sneezing, swollen glands, vomiting or wheezing.   Pt was seen by [facility] on 02/25/2016, who diagnosed pt with a URI. Was prescribed Tessalon Perles and Ventolin, without relief. Cough is worse at night. Pt is requesting something to help her sleep.      Allergies  Allergen Reactions  . Levofloxacin     Other reaction(s): Joint Pains  . Influenza Vaccines Other (See Comments)    Bell's Palsy  . Iodine     Oysters - facial swelling (after eating several days in a row)     Current Outpatient Prescriptions:  .  benzonatate (TESSALON) 100 MG capsule, , Disp: , Rfl:  .  DUREZOL 0.05 % EMUL, , Disp: , Rfl:  .  moxifloxacin (VIGAMOX) 0.5 % ophthalmic solution, , Disp: , Rfl:  .  Multiple Vitamin (MULTIVITAMIN)  capsule, Take 1 capsule by mouth daily., Disp: , Rfl:  .  omeprazole (PRILOSEC) 20 MG capsule, Take 1 capsule (20 mg total) by mouth 2 times daily at 12 noon and 4 pm., Disp: 180 capsule, Rfl: 3 .  VENTOLIN HFA 108 (90 Base) MCG/ACT inhaler, , Disp: , Rfl:  .  Psyllium (METAMUCIL FIBER PO), Take by mouth., Disp: , Rfl:   Review of Systems  HENT: Positive for congestion, rhinorrhea and sore throat. Negative for ear pain and sneezing.   Respiratory: Positive for cough (due to URI; causing insomnia). Negative for choking and wheezing.   Cardiovascular: Negative for chest pain.  Gastrointestinal: Positive for dysphagia. Negative for abdominal pain, heartburn, nausea and vomiting.  Musculoskeletal: Negative for neck pain.  Allergic/Immunologic: Negative.   Neurological: Positive for headaches.  Psychiatric/Behavioral: Negative.     Social History  Substance Use Topics  . Smoking status: Former Smoker    Packs/day: 1.00    Years: 30.00    Types: Cigarettes    Quit date: 05/17/1991  . Smokeless tobacco: Never Used  . Alcohol use 4.2 oz/week    7 Standard drinks or equivalent per week     Comment: Occasional Glass of Wine.   Objective:   BP 140/70 (BP Location: Left Arm, Patient Position: Sitting, Cuff Size: Large)   Pulse 76   Temp 98.1 F (36.7 C) (Oral)   Resp 16   Wt 217 lb (  98.4 kg)   BMI 33.99 kg/m   Physical Exam  Constitutional: She appears well-developed and well-nourished.  HENT:  Head: Normocephalic and atraumatic.  Right Ear: Tympanic membrane normal.  Left Ear: Tympanic membrane and external ear normal.  Nose: Nose normal.  Mouth/Throat: Oropharynx is clear and moist.  Eyes: Conjunctivae are normal. Pupils are equal, round, and reactive to light. Right eye exhibits no discharge. Left eye exhibits no discharge. No scleral icterus.  Neck: Normal range of motion. Neck supple. No thyromegaly present.  Cardiovascular: Normal rate, regular rhythm and normal heart sounds.    Pulmonary/Chest: Effort normal and breath sounds normal. No respiratory distress.  Abdominal: Soft.  Lymphadenopathy:    She has no cervical adenopathy.  Skin: Skin is warm and dry.  Psychiatric: She has a normal mood and affect. Her behavior is normal. Judgment and thought content normal.        Assessment & Plan:     1. Cough Start medications as below. Call if sx fail to improve. - chlorpheniramine-HYDROcodone (TUSSIONEX PENNKINETIC ER) 10-8 MG/5ML SUER; Take 5 mLs by mouth every 12 (twelve) hours as needed for cough.  Dispense: 140 mL; Refill: 0 - predniSONE (STERAPRED UNI-PAK 48 TAB) 10 MG (48) TBPK tablet; Take by mouth daily. Taper as prescribed.  Dispense: 48 tablet; Refill: 0  2. Gastroesophageal reflux disease, esophagitis presence not specified Stable. Continue Decrease Omeprazole to one tablet per day. Place bricks at head of bed for elevation.     Patient seen and examined by Miguel Aschoff, MD, and note scribed by Renaldo Fiddler, CMA.  I have done the exam and reviewed the above chart and it is accurate to the best of my knowledge.  Richard Cranford Mon, MD  [facility]

## 2016-03-17 ENCOUNTER — Telehealth: Payer: Self-pay | Admitting: Family Medicine

## 2016-03-17 MED ORDER — PREDNISONE 10 MG (21) PO TBPK
ORAL_TABLET | ORAL | 0 refills | Status: DC
Start: 2016-03-17 — End: 2016-06-15

## 2016-03-17 NOTE — Telephone Encounter (Signed)
Pt states she seen Dr Rosanna Randy on 03/07/2016.  Pt still has 2 days of medication but still have cough and chest congestion.  Pt is asking if she can get a medication refill?  [facility]  CB#[phone]/MW

## 2016-03-17 NOTE — Telephone Encounter (Signed)
Refill of what?  

## 2016-03-17 NOTE — Telephone Encounter (Signed)
Please review-aa 

## 2016-03-17 NOTE — Telephone Encounter (Signed)
Prtednisone 10mg --6 day taper.thx

## 2016-03-17 NOTE — Telephone Encounter (Signed)
RX sent in and pt advised-aa 

## 2016-03-17 NOTE — Telephone Encounter (Signed)
Patient states she was asking about Prednisone getting refilled if possible. She denies fever or chills. Tussinex for the first time for her not doing much to help-aa

## 2016-03-22 DIAGNOSIS — Z961 Presence of intraocular lens: Secondary | ICD-10-CM | POA: Diagnosis not present

## 2016-04-28 ENCOUNTER — Ambulatory Visit: Payer: Commercial Managed Care - HMO | Admitting: Physical Therapy

## 2016-05-19 ENCOUNTER — Encounter: Payer: Self-pay | Admitting: Physical Therapy

## 2016-05-26 ENCOUNTER — Encounter: Payer: Self-pay | Admitting: Physical Therapy

## 2016-06-02 ENCOUNTER — Encounter: Payer: Self-pay | Admitting: Physical Therapy

## 2016-06-15 ENCOUNTER — Ambulatory Visit (INDEPENDENT_AMBULATORY_CARE_PROVIDER_SITE_OTHER): Payer: Medicare HMO | Admitting: Family Medicine

## 2016-06-15 ENCOUNTER — Encounter: Payer: Self-pay | Admitting: Family Medicine

## 2016-06-15 VITALS — BP 164/96 | HR 82 | Temp 98.3°F | Resp 16 | Wt 219.0 lb

## 2016-06-15 DIAGNOSIS — M79605 Pain in left leg: Secondary | ICD-10-CM | POA: Diagnosis not present

## 2016-06-15 DIAGNOSIS — Z96651 Presence of right artificial knee joint: Secondary | ICD-10-CM | POA: Diagnosis not present

## 2016-06-15 DIAGNOSIS — M25562 Pain in left knee: Secondary | ICD-10-CM

## 2016-06-15 MED ORDER — MELOXICAM 7.5 MG PO TABS: 7.5000 mg | ORAL_TABLET | Freq: Every day | ORAL | 0 refills | Status: DC

## 2016-06-15 NOTE — Progress Notes (Signed)
Patient: Lauren Lloyd Female    DOB: 03-Jan-1941   76 y.o.   MRN: [id number] Visit Date: 06/15/2016  Today's Provider: [name], MD   Chief Complaint  Patient presents with  . Knee Pain   Subjective:    HPI  Patient is here to discuss her left knee pain. This has been an issue for about 6 months off and on but has gotten more frequent lately. At first she would get pain in the knee only but not pain is radiating knee and down. Pain is worse with laying down no matter the position. Aches at night. No injury to the leg that she can re call. No certain pattern during the day when pain comes on. Aleve does help.    Allergies  Allergen Reactions  . Levofloxacin     Other reaction(s): Joint Pains  . Influenza Vaccines Other (See Comments)    Bell's Palsy  . Iodine     Oysters - facial swelling (after eating several days in a row)     Current Outpatient Prescriptions:  [name] Kitchen  Multiple Vitamin (MULTIVITAMIN) capsule, Take 1 capsule by mouth daily., Disp: , Rfl:  .  omeprazole (PRILOSEC) 20 MG capsule, Take 1 capsule (20 mg total) by mouth 2 times daily at 12 noon and 4 pm. (Patient not taking: Reported on 06/15/2016), Disp: 180 capsule, Rfl: 3 .  VENTOLIN HFA 108 (90 Base) MCG/ACT inhaler, , Disp: , Rfl:   Review of Systems  Constitutional: Negative.   Respiratory: Negative.   Cardiovascular: Negative.   Gastrointestinal: Negative.   Musculoskeletal: Positive for arthralgias, gait problem, joint swelling and myalgias (left leg pain).    Social History  Substance Use Topics  . Smoking status: Former Smoker    Packs/day: 1.00    Years: 30.00    Types: Cigarettes    Quit date: 05/17/1991  . Smokeless tobacco: Never Used  . Alcohol use 4.2 oz/week    7 Standard drinks or equivalent per week     Comment: Occasional Glass of Wine.   Objective:   BP (!) 164/96   Pulse 82   Temp 98.3 F (36.8 C)   Resp 16   Wt 219 lb (99.3 kg)   BMI 34.30 kg/m   Physical Exam   Constitutional: She appears well-developed and well-nourished.  HENT:  Head: Normocephalic and atraumatic.  Musculoskeletal: She exhibits tenderness.  Right calf measures at 18 inches, Left calf 18 inches also      Assessment & Plan:     1. Left leg pain Worsening. Do not think this is a clot. I think this is probably pain coming from knee issue( OA vs meniscal tear)  that she may be developing. Had right knee surgery in the past. Possible muscular sprain. Discussed this in detail. If she develops swelling patient advised to let us know. Will get Knee Xray. Start meloxicam and advised to take Omeprazole while taking this medication. May need referral to specialist if symptoms persist or get worse. (Dr. Forrest Moron from [facility] if patient needs to see a specialist this is who she wants to see) HPI, Exam and A&P transcribed under direction and in the presence of Miguel Aschoff, MD.        I have done the exam and reviewed the above chart and it is accurate to the best of my knowledge. Development worker, community has been used in this note in any air is in the dictation or transcription are  unintentional.  [name], MD  [facility]

## 2016-06-16 ENCOUNTER — Ambulatory Visit
Admission: RE | Admit: 2016-06-16 | Discharge: 2016-06-16 | Disposition: A | Discharge status: Home / Self Care | Payer: Medicare HMO | Source: Ambulatory Visit | Attending: Family Medicine | Admitting: Family Medicine

## 2016-06-16 ENCOUNTER — Ambulatory Visit: Payer: Commercial Managed Care - HMO | Admitting: Physical Therapy

## 2016-06-16 DIAGNOSIS — M1712 Unilateral primary osteoarthritis, left knee: Secondary | ICD-10-CM | POA: Insufficient documentation

## 2016-06-16 DIAGNOSIS — M79605 Pain in left leg: Secondary | ICD-10-CM | POA: Insufficient documentation

## 2016-06-16 DIAGNOSIS — M25562 Pain in left knee: Secondary | ICD-10-CM | POA: Insufficient documentation

## 2016-06-16 NOTE — Progress Notes (Signed)
Advised  ED 

## 2016-06-30 ENCOUNTER — Ambulatory Visit: Payer: Commercial Managed Care - HMO | Admitting: Physical Therapy

## 2016-07-08 ENCOUNTER — Telehealth: Payer: Self-pay | Admitting: Family Medicine

## 2016-07-08 NOTE — Telephone Encounter (Signed)
Called Pt to RE-schedule AWV with [facility] - knb

## 2016-07-14 ENCOUNTER — Encounter: Payer: Self-pay | Admitting: Physical Therapy

## 2016-07-26 ENCOUNTER — Encounter: Payer: Self-pay | Admitting: Physician Assistant

## 2016-07-26 ENCOUNTER — Ambulatory Visit (INDEPENDENT_AMBULATORY_CARE_PROVIDER_SITE_OTHER): Payer: Medicare HMO | Admitting: Physician Assistant

## 2016-07-26 VITALS — BP 114/66 | HR 72 | Temp 97.8°F | Resp 16 | Wt 216.0 lb

## 2016-07-26 DIAGNOSIS — J069 Acute upper respiratory infection, unspecified: Secondary | ICD-10-CM | POA: Diagnosis not present

## 2016-07-26 DIAGNOSIS — R062 Wheezing: Secondary | ICD-10-CM

## 2016-07-26 MED ORDER — HYDROCODONE-HOMATROPINE 5-1.5 MG/5ML PO SYRP: 5.0000 mL | ORAL_SOLUTION | Freq: Every evening | ORAL | 0 refills | Status: DC | PRN

## 2016-07-26 MED ORDER — DOXYCYCLINE MONOHYDRATE 100 MG PO CAPS: 100.0000 mg | ORAL_CAPSULE | Freq: Two times a day (BID) | ORAL | 0 refills | Status: AC

## 2016-07-26 MED ORDER — PREDNISONE 10 MG PO TABS: 20.0000 mg | ORAL_TABLET | Freq: Every day | ORAL | 0 refills | Status: AC

## 2016-07-26 NOTE — Progress Notes (Signed)
[name]  Chief Complaint  Patient presents with  . URI    Subjective:    Patient ID: Lauren Lloyd, female    DOB: 1940/11/07, 76 y.o.   MRN: [id number]  Upper Respiratory Infection: Lauren Lloyd is a 76 y.o. female with a past medical history significant for 30 pack year smoking history and complaining of symptoms of a URI. Symptoms include congestion and cough. Onset of symptoms was 6 days ago, gradually worsening since that time. She also c/o congestion and cough described as productive for the past 3 days .  She is drinking plenty of fluids. Evaluation to date: none. Treatment to date: cough suppressants. The treatment has provided minimal. Has a history of similar symptoms with progression into otitis media. Treated last time with steroids.  Review of Systems  Constitutional: Positive for chills, diaphoresis and fatigue. Negative for activity change, appetite change, fever and unexpected weight change.  HENT: Positive for congestion and voice change. Negative for ear discharge, ear pain, nosebleeds, postnasal drip, rhinorrhea, sinus pain, sinus pressure, sneezing, sore throat and trouble swallowing.   Eyes: Negative.   Respiratory: Positive for cough, chest tightness, shortness of breath and wheezing. Negative for apnea, choking and stridor.   Gastrointestinal: Negative.   Musculoskeletal: Negative.   Neurological: Positive for light-headedness and headaches. Negative for dizziness.       Objective:   BP 114/66 (BP Location: Left Arm, Patient Position: Sitting, Cuff Size: Normal)   Pulse 72   Temp 97.8 F (36.6 C) (Oral)   Resp 16   Wt 216 lb (98 kg)   BMI 33.83 kg/m   Patient Active Problem List   Diagnosis Date Noted  . Pulmonary hypertension 04/02/2015  . Chest pain radiating to arm 02/17/2015  . Acid reflux 11/18/2014  . 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 Primary osteoarthritis of one hip 10/03/2011  . L-S radiculopathy  09/29/2011  . Arthritis of knee, degenerative 09/29/2011  . S/P knee replacement 09/29/2011  . Non-toxic uninodular goiter 06/20/2009  . Cervical pain 08/20/2008  . Cannot sleep 12/12/2007  . Hypercholesteremia 07/30/2007    Outpatient Encounter Prescriptions as of 07/26/2016  Medication Sig Note  . Multiple Vitamin (MULTIVITAMIN) capsule Take 1 capsule by mouth daily.   [name] Kitchen omeprazole (PRILOSEC) 20 MG capsule Take 1 capsule (20 mg total) by mouth 2 times daily at 12 noon and 4 pm.   . doxycycline (MONODOX) 100 MG capsule Take 1 capsule (100 mg total) by mouth 2 (two) times daily.   [name] Kitchen HYDROcodone-homatropine (HYCODAN) 5-1.5 MG/5ML syrup Take 5 mLs by mouth at bedtime as needed.   . predniSONE (DELTASONE) 10 MG tablet Take 2 tablets (20 mg total) by mouth daily with breakfast.   . VENTOLIN HFA 108 (90 Base) MCG/ACT inhaler  03/07/2016: Received from: External Pharmacy  . [DISCONTINUED] meloxicam (MOBIC) 7.5 MG tablet Take 1 tablet (7.5 mg total) by mouth daily. (Patient not taking: Reported on 07/26/2016)    No facility-administered encounter medications on file as of 07/26/2016.     Allergies  Allergen Reactions  . Levofloxacin     Other reaction(s): Joint Pains  . Influenza Vaccines Other (See Comments)    Bell's Palsy  . Iodine     Oysters - facial swelling (after eating several days in a row)       Physical Exam  Constitutional: She appears well-developed and well-nourished.  Sounds congested.  Cardiovascular: Normal rate and regular rhythm.   Pulmonary/Chest: No respiratory distress.  She has wheezes. She has no rales.  Scant expiratory wheezes.   Lymphadenopathy:    She has cervical adenopathy.  Skin: Skin is warm and dry.       Assessment & Plan:   1. URI with cough and congestion  With smoking history, will treat as below. Have sent in prednisone should patient experience worsening in her wheezing. Counseled on Hycodan and sedation risk.   - doxycycline (MONODOX)  100 MG capsule; Take 1 capsule (100 mg total) by mouth 2 (two) times daily.  Dispense: 14 capsule; Refill: 0 - HYDROcodone-homatropine (HYCODAN) 5-1.5 MG/5ML syrup; Take 5 mLs by mouth at bedtime as needed.  Dispense: 120 mL; Refill: 0  2. Wheezing  - predniSONE (DELTASONE) 10 MG tablet; Take 2 tablets (20 mg total) by mouth daily with breakfast.  Dispense: 10 tablet; Refill: 0  Return if symptoms worsen or fail to improve.    Patient Instructions  Upper Respiratory Infection, Adult Most upper respiratory infections (URIs) are caused by a virus. A URI affects the nose, throat, and upper air passages. The most common type of URI is often called "the common cold." Follow these instructions at home:  Take medicines only as told by your doctor.  Gargle warm saltwater or take cough drops to comfort your throat as told by your doctor.  Use a warm mist humidifier or inhale steam from a shower to increase air moisture. This may make it easier to breathe.  Drink enough fluid to keep your pee (urine) clear or pale yellow.  Eat soups and other clear broths.  Have a healthy diet.  Rest as needed.  Go back to work when your fever is gone or your doctor says it is okay.  You may need to stay home longer to avoid giving your URI to others.  You can also wear a face mask and wash your hands often to prevent spread of the virus.  Use your inhaler more if you have asthma.  Do not use any tobacco products, including cigarettes, chewing tobacco, or electronic cigarettes. If you need help quitting, ask your doctor. Contact a doctor if:  You are getting worse, not better.  Your symptoms are not helped by medicine.  You have chills.  You are getting more short of breath.  You have brown or red mucus.  You have yellow or brown discharge from your nose.  You have pain in your face, especially when you bend forward.  You have a fever.  You have puffy (swollen) neck glands.  You have  pain while swallowing.  You have white areas in the back of your throat. Get help right away if:  You have very bad or constant:  Headache.  Ear pain.  Pain in your forehead, behind your eyes, and over your cheekbones (sinus pain).  Chest pain.  You have long-lasting (chronic) lung disease and any of the following:  Wheezing.  Long-lasting cough.  Coughing up blood.  A change in your usual mucus.  You have a stiff neck.  You have changes in your:  Vision.  Hearing.  Thinking.  Mood. This information is not intended to replace advice given to you by your health care provider. Make sure you discuss any questions you have with your health care provider. Document Released: 10/19/2007 Document Revised: 01/03/2016 Document Reviewed: 08/07/2013 Elsevier Interactive Patient Education  2017 [employer].     [name], Review of Systems and Physical  Exam were personally obtained by me. Portions of this information were initially documented by Ashley Royalty, CMA and reviewed by me for thoroughness and accuracy.

## 2016-07-26 NOTE — Patient Instructions (Signed)
Upper Respiratory Infection, Adult Most upper respiratory infections (URIs) are caused by a virus. A URI affects the nose, throat, and upper air passages. The most common type of URI is often called "the common cold." Follow these instructions at home:  Take medicines only as told by your doctor.  Gargle warm saltwater or take cough drops to comfort your throat as told by your doctor.  Use a warm mist humidifier or inhale steam from a shower to increase air moisture. This may make it easier to breathe.  Drink enough fluid to keep your pee (urine) clear or pale yellow.  Eat soups and other clear broths.  Have a healthy diet.  Rest as needed.  Go back to work when your fever is gone or your doctor says it is okay.  You may need to stay home longer to avoid giving your URI to others.  You can also wear a face mask and wash your hands often to prevent spread of the virus.  Use your inhaler more if you have asthma.  Do not use any tobacco products, including cigarettes, chewing tobacco, or electronic cigarettes. If you need help quitting, ask your doctor. Contact a doctor if:  You are getting worse, not better.  Your symptoms are not helped by medicine.  You have chills.  You are getting more short of breath.  You have brown or red mucus.  You have yellow or brown discharge from your nose.  You have pain in your face, especially when you bend forward.  You have a fever.  You have puffy (swollen) neck glands.  You have pain while swallowing.  You have white areas in the back of your throat. Get help right away if:  You have very bad or constant:  Headache.  Ear pain.  Pain in your forehead, behind your eyes, and over your cheekbones (sinus pain).  Chest pain.  You have long-lasting (chronic) lung disease and any of the following:  Wheezing.  Long-lasting cough.  Coughing up blood.  A change in your usual mucus.  You have a stiff neck.  You have  changes in your:  Vision.  Hearing.  Thinking.  Mood. This information is not intended to replace advice given to you by your health care provider. Make sure you discuss any questions you have with your health care provider. Document Released: 10/19/2007 Document Revised: 01/03/2016 Document Reviewed: 08/07/2013 Elsevier Interactive Patient Education  2017 Else[employer] Inc.  

## 2016-07-28 ENCOUNTER — Encounter: Payer: Self-pay | Admitting: Physical Therapy

## 2016-08-11 ENCOUNTER — Encounter: Payer: Self-pay | Admitting: Physical Therapy

## 2016-08-25 ENCOUNTER — Encounter: Payer: Self-pay | Admitting: Physical Therapy

## 2016-09-21 DIAGNOSIS — Z961 Presence of intraocular lens: Secondary | ICD-10-CM | POA: Diagnosis not present

## 2016-10-25 DIAGNOSIS — Z85828 Personal history of other malignant neoplasm of skin: Secondary | ICD-10-CM | POA: Diagnosis not present

## 2016-10-25 DIAGNOSIS — L821 Other seborrheic keratosis: Secondary | ICD-10-CM | POA: Diagnosis not present

## 2016-10-25 DIAGNOSIS — D2261 Melanocytic nevi of right upper limb, including shoulder: Secondary | ICD-10-CM | POA: Diagnosis not present

## 2016-10-25 DIAGNOSIS — D225 Melanocytic nevi of trunk: Secondary | ICD-10-CM | POA: Diagnosis not present

## 2016-11-14 ENCOUNTER — Ambulatory Visit (INDEPENDENT_AMBULATORY_CARE_PROVIDER_SITE_OTHER): Payer: Medicare HMO | Admitting: Family Medicine

## 2016-11-14 ENCOUNTER — Encounter: Payer: Self-pay | Admitting: Family Medicine

## 2016-11-14 ENCOUNTER — Ambulatory Visit
Admission: RE | Admit: 2016-11-14 | Discharge: 2016-11-14 | Disposition: A | Discharge status: Home / Self Care | Payer: Medicare HMO | Source: Ambulatory Visit | Attending: Family Medicine | Admitting: Family Medicine

## 2016-11-14 ENCOUNTER — Other Ambulatory Visit: Payer: Self-pay | Admitting: Family Medicine

## 2016-11-14 VITALS — BP 142/80 | HR 81 | Temp 98.7°F | Resp 16 | Wt 217.0 lb

## 2016-11-14 DIAGNOSIS — I272 Pulmonary hypertension, unspecified: Secondary | ICD-10-CM

## 2016-11-14 DIAGNOSIS — R0600 Dyspnea, unspecified: Secondary | ICD-10-CM | POA: Insufficient documentation

## 2016-11-14 DIAGNOSIS — T63481A Toxic effect of venom of other arthropod, accidental (unintentional), initial encounter: Secondary | ICD-10-CM

## 2016-11-14 DIAGNOSIS — R918 Other nonspecific abnormal finding of lung field: Secondary | ICD-10-CM | POA: Insufficient documentation

## 2016-11-14 DIAGNOSIS — I517 Cardiomegaly: Secondary | ICD-10-CM | POA: Insufficient documentation

## 2016-11-14 DIAGNOSIS — R9389 Abnormal findings on diagnostic imaging of other specified body structures: Secondary | ICD-10-CM

## 2016-11-14 NOTE — Patient Instructions (Addendum)
We will call you with the chest x-ray result and the appointment time with Dr. Fletcher Anon. Discussed use of ice, Claritin and Benadryl for bee stings.

## 2016-11-14 NOTE — Progress Notes (Signed)
Subjective:     Patient ID: Lauren Lloyd, female   DOB: 29-Jun-1940, 76 y.o.   MRN: [id number]  HPI  Chief Complaint  Patient presents with  . Shortness of Breath    Patient comes in office today with complaints of shortness of breath for 1 week and cough for the past 3 months.   . Insect Bite    Patient reports on the way to office this morning she was stung by two yellow jackets. Patient has swelling of left ear lobe and left elbow.   States she has had mild chronic shortness of breath with exertion attributed to her previous smoking history. In the last week she noticed shortness of breath with trivial exertion and her heart racing once when she lay down at night. Prior cardiac evaluation in 2016 with dx of mild pulmonary hypertension. States her sisters have atrial fib.and she is fearful of that. Reports chronic cough after uri earlier this year.   Review of Systems     Objective:   Physical Exam  Constitutional: She appears well-developed and well-nourished. No distress.  Cardiovascular: Normal rate and regular rhythm.   Pulmonary/Chest: Breath sounds normal.  Musculoskeletal: She exhibits no edema (of lower extremities.).  Skin:  Left elbow and left ear with swelling and erythema c/w insect stings. No stinger noted.       Assessment:    1. Pulmonary hypertension ([place]) - Ambulatory referral to Cardiology  2. Dyspnea, unspecified type - DG Chest 2 View; Future - Ambulatory referral to Cardiology  3. Insect stings, accidental or unintentional, initial encounter    Plan:    Further f/u pending x-ray result. Discussed use of ice, Claritin, and Benadryl for her stings.

## 2016-11-24 ENCOUNTER — Ambulatory Visit
Admission: RE | Admit: 2016-11-24 | Discharge: 2016-11-24 | Disposition: A | Discharge status: Home / Self Care | Payer: Medicare HMO | Source: Ambulatory Visit | Attending: Family Medicine | Admitting: Family Medicine

## 2016-11-24 ENCOUNTER — Ambulatory Visit: Payer: Self-pay

## 2016-11-24 ENCOUNTER — Encounter: Payer: Self-pay | Admitting: Family Medicine

## 2016-11-24 DIAGNOSIS — I700 Atherosclerosis of aorta: Secondary | ICD-10-CM | POA: Insufficient documentation

## 2016-11-24 DIAGNOSIS — R9389 Abnormal findings on diagnostic imaging of other specified body structures: Secondary | ICD-10-CM

## 2016-11-24 DIAGNOSIS — R0602 Shortness of breath: Secondary | ICD-10-CM | POA: Diagnosis not present

## 2016-11-24 DIAGNOSIS — R938 Abnormal findings on diagnostic imaging of other specified body structures: Secondary | ICD-10-CM | POA: Diagnosis present

## 2016-11-26 ENCOUNTER — Encounter: Payer: Self-pay | Admitting: Physician Assistant

## 2016-11-26 ENCOUNTER — Ambulatory Visit (INDEPENDENT_AMBULATORY_CARE_PROVIDER_SITE_OTHER): Payer: Medicare HMO | Admitting: Physician Assistant

## 2016-11-26 VITALS — BP 142/78 | HR 74 | Temp 97.9°F | Resp 16 | Wt 210.0 lb

## 2016-11-26 DIAGNOSIS — R21 Rash and other nonspecific skin eruption: Secondary | ICD-10-CM

## 2016-11-26 MED ORDER — PREDNISONE 10 MG (21) PO TBPK: ORAL_TABLET | ORAL | 0 refills | Status: DC

## 2016-11-26 NOTE — Patient Instructions (Signed)
Contact Dermatitis Dermatitis is redness, soreness, and swelling (inflammation) of the skin. Contact dermatitis is a reaction to certain substances that touch the skin. You either touched something that irritated your skin, or you have allergies to something you touched. Follow these instructions at home: Skin Care  Moisturize your skin as needed.  Apply cool compresses to the affected areas.  Try taking a bath with: ? Epsom salts. Follow the instructions on the package. You can get these at a pharmacy or grocery store. ? Baking soda. Pour a small amount into the bath as told by your doctor. ? Colloidal oatmeal. Follow the instructions on the package. You can get this at a pharmacy or grocery store.  Try applying baking soda paste to your skin. Stir water into baking soda until it looks like paste.  Do not scratch your skin.  Bathe less often.  Bathe in lukewarm water. Avoid using hot water. Medicines  Take or apply over-the-counter and prescription medicines only as told by your doctor.  If you were prescribed an antibiotic medicine, take or apply your antibiotic as told by your doctor. Do not stop taking the antibiotic even if your condition starts to get better. General instructions  Keep all follow-up visits as told by your doctor. This is important.  Avoid the substance that caused your reaction. If you do not know what caused it, keep a journal to try to track what caused it. Write down: ? What you eat. ? What cosmetic products you use. ? What you drink. ? What you wear in the affected area. This includes jewelry.  If you were given a bandage (dressing), take care of it as told by your doctor. This includes when to change and remove it. Contact a doctor if:  You do not get better with treatment.  Your condition gets worse.  You have signs of infection such as: ? Swelling. ? Tenderness. ? Redness. ? Soreness. ? Warmth.  You have a fever.  You have new  symptoms. Get help right away if:  You have a very bad headache.  You have neck pain.  Your neck is stiff.  You throw up (vomit).  You feel very sleepy.  You see red streaks coming from the affected area.  Your bone or joint underneath the affected area becomes painful after the skin has healed.  The affected area turns darker.  You have trouble breathing. This information is not intended to replace advice given to you by your health care provider. Make sure you discuss any questions you have with your health care provider. Document Released: 02/27/2009 Document Revised: 10/08/2015 Document Reviewed: 09/17/2014 Elsevier Interactive Patient Education  2018 Elsevier Inc.  

## 2016-11-26 NOTE — Progress Notes (Signed)
Patient: Lauren Lloyd Female    DOB: [birth year]   76 y.o.   MRN: [id number] Visit Date: 11/26/2016  Today's Provider: Trinna Post, PA-C   Chief Complaint  Patient presents with  . Rash   Subjective:    HPI Pt is here today for a rash. She reports that it started about 3 days ago. Located under her left breast, her underarms, her groin and on her buttock. She reports that the lesions are itchy, burn and hurt. She was stung by about 10 yellow jackets 10 days prior to this rash appearing, however insect bites do not correspond with area of rash. She does recall actually having shingles in the past. She has tried benadryl for her symptoms and reports that it helped a little. No new medications, detergents, lotions, exposures that she can think of. Rash is not painful, simply itchy. No pus.     Allergies  Allergen Reactions  . Levofloxacin     Other reaction(s): Joint Pains  . Influenza Vaccines Other (See Comments)    Bell's Palsy  . Oysters [Shellfish Allergy] Swelling    She states she had eaten them three days in a row and she developed swelling around her eyes.      Current Outpatient Prescriptions:  [name] Kitchen  Multiple Vitamin (MULTIVITAMIN) capsule, Take 1 capsule by mouth daily., Disp: , Rfl:  .  omeprazole (PRILOSEC) 20 MG capsule, Take 1 capsule (20 mg total) by mouth 2 times daily at 12 noon and 4 pm., Disp: 180 capsule, Rfl: 3 .  VENTOLIN HFA 108 (90 Base) MCG/ACT inhaler, , Disp: , Rfl:   Review of Systems  Constitutional: Negative.   HENT: Negative.   Eyes: Negative.   Respiratory: Negative.   Cardiovascular: Negative.   Gastrointestinal: Negative.   Endocrine: Negative.   Genitourinary: Negative.   Musculoskeletal: Negative.   Skin: Positive for rash.  Allergic/Immunologic: Negative.   Hematological: Negative.     Social History  Substance Use Topics  . Smoking status: Former Smoker    Packs/day: 1.00    Years: 30.00    Types: Cigarettes    Quit  date: 05/17/1991  . Smokeless tobacco: Never Used  . Alcohol use 4.2 oz/week    7 Standard drinks or equivalent per week     Comment: Occasional Glass of Wine.   Objective:   BP (!) 142/78 (BP Location: Left Arm, Patient Position: Sitting, Cuff Size: Normal)   Pulse 74   Temp 97.9 F (36.6 C) (Oral)   Resp 16   Wt 210 lb (95.3 kg)   BMI 32.89 kg/m  Vitals:   11/26/16 1048  BP: (!) 142/78  Pulse: 74  Resp: 16  Temp: 97.9 F (36.6 C)  TempSrc: Oral  Weight: 210 lb (95.3 kg)     Physical Exam  Constitutional: She is oriented to person, place, and time. She appears well-developed and well-nourished.  Cardiovascular: Normal rate.   Pulmonary/Chest: Effort normal.  Lymphadenopathy:    She has no axillary adenopathy.  Neurological: She is alert and oriented to person, place, and time.  Skin: Skin is warm and dry. Rash noted. There is erythema.  Indurated erythematous 1 cm lesions sporadically under right axilla, under left nipple, and on buttocks. No weeping, pus. Do not appear fluid filled or vesicular.         Assessment & Plan:     1. Rash  Does not appear to be hives or shingles. Will give prednisone  for the itching. Call us if worsening or not completely resolved.  - predniSONE (STERAPRED UNI-PAK 21 TAB) 10 MG (21) TBPK tablet; Take 6 pills on day 1, then 5, 4, 3, 2, 1.  Dispense: 21 tablet; Refill: [place], PA-C  [facility]

## 2016-11-29 ENCOUNTER — Telehealth: Payer: Self-pay | Admitting: Cardiovascular Disease

## 2016-11-29 NOTE — Telephone Encounter (Signed)
Called to offer 7/18, 11:30am appt with Ignacia Bayley, NP.  No answer on home or cell number. Left message for pt to call back.

## 2016-12-01 ENCOUNTER — Encounter: Payer: Self-pay | Admitting: Family Medicine

## 2016-12-01 ENCOUNTER — Ambulatory Visit (INDEPENDENT_AMBULATORY_CARE_PROVIDER_SITE_OTHER): Payer: Medicare HMO

## 2016-12-01 VITALS — BP 148/80 | HR 72 | Temp 99.0°F | Ht 67.0 in | Wt 212.2 lb

## 2016-12-01 DIAGNOSIS — Z0000 Encounter for general adult medical examination without abnormal findings: Secondary | ICD-10-CM | POA: Diagnosis not present

## 2016-12-01 NOTE — Patient Instructions (Signed)
Lauren Lloyd , Thank you for taking time to come for your Medicare Wellness Visit. I appreciate your ongoing commitment to your health goals. Please review the following plan we discussed and let me know if I can assist you in the future.   Screening recommendations/referrals: Colonoscopy: completed 01/23/14 Mammogram: completed 01/12/16, due 12/2016 Bone Density: completed 08/06/13 Recommended yearly ophthalmology/optometry visit for glaucoma screening and checkup Recommended yearly dental visit for hygiene and checkup  Vaccinations: Influenza vaccine: due 01/2017 Pneumococcal vaccine: completed series Tdap vaccine: completed 02/27/12, due 02/2022 Shingles vaccine: declined    Advanced directives: Please bring a copy of your POA (Power of [employer]) and/or Living Will to your next appointment.   Conditions/risks identified: Fall risk prevention; Recommend decreasing portion sizes for each meal (3) and add in 2 healthy snacks in between.   Next appointment: 12/14/16 @ 8:30 AM   Preventive Care 65 Years and Older, Female Preventive care refers to lifestyle choices and visits with your health care provider that can promote health and wellness. What does preventive care include?  A yearly physical exam. This is also called an annual well check.  Dental exams once or twice a year.  Routine eye exams. Ask your health care provider how often you should have your eyes checked.  Personal lifestyle choices, including:  Daily care of your teeth and gums.  Regular physical activity.  Eating a healthy diet.  Avoiding tobacco and drug use.  Limiting alcohol use.  Practicing safe sex.  Taking low-dose aspirin every day.  Taking vitamin and mineral supplements as recommended by your health care provider. What happens during an annual well check? The services and screenings done by your health care provider during your annual well check will depend on your age, overall health,  lifestyle risk factors, and family history of disease. Counseling  Your health care provider may ask you questions about your:  Alcohol use.  Tobacco use.  Drug use.  Emotional well-being.  Home and relationship well-being.  Sexual activity.  Eating habits.  History of falls.  Memory and ability to understand (cognition).  Work and work Statistician.  Reproductive health. Screening  You may have the following tests or measurements:  Height, weight, and BMI.  Blood pressure.  Lipid and cholesterol levels. These may be checked every 5 years, or more frequently if you are over 40 years old.  Skin check.  Lung cancer screening. You may have this screening every year starting at age 37 if you have a 30-pack-year history of smoking and currently smoke or have quit within the past 15 years.  Fecal occult blood test (FOBT) of the stool. You may have this test every year starting at age 12.  Flexible sigmoidoscopy or colonoscopy. You may have a sigmoidoscopy every 5 years or a colonoscopy every 10 years starting at age 77.  Hepatitis C blood test.  Hepatitis B blood test.  Sexually transmitted disease (STD) testing.  Diabetes screening. This is done by checking your blood sugar (glucose) after you have not eaten for a while (fasting). You may have this done every 1-3 years.  Bone density scan. This is done to screen for osteoporosis. You may have this done starting at age 22.  Mammogram. This may be done every 1-2 years. Talk to your health care provider about how often you should have regular mammograms. Talk with your health care provider about your test results, treatment options, and if necessary, the need for more tests. Vaccines  Your health care provider  may recommend certain vaccines, such as:  Influenza vaccine. This is recommended every year.  Tetanus, diphtheria, and acellular pertussis (Tdap, Td) vaccine. You may need a Td booster every 10 years.  Zoster  vaccine. You may need this after age 57.  Pneumococcal 13-valent conjugate (PCV13) vaccine. One dose is recommended after age 55.  Pneumococcal polysaccharide (PPSV23) vaccine. One dose is recommended after age 56. Talk to your health care provider about which screenings and vaccines you need and how often you need them. This information is not intended to replace advice given to you by your health care provider. Make sure you discuss any questions you have with your health care provider. Document Released: 05/29/2015 Document Revised: 01/20/2016 Document Reviewed: 03/03/2015 Elsevier Interactive Patient Education  2017 [place] Prevention in the Home Falls can cause injuries. They can happen to people of all ages. There are many things you can do to make your home safe and to help prevent falls. What can I do on the outside of my home?  Regularly fix the edges of walkways and driveways and fix any cracks.  Remove anything that might make you trip as you walk through a door, such as a raised step or threshold.  Trim any bushes or trees on the path to your home.  Use bright outdoor lighting.  Clear any walking paths of anything that might make someone trip, such as rocks or tools.  Regularly check to see if handrails are loose or broken. Make sure that both sides of any steps have handrails.  Any raised decks and porches should have guardrails on the edges.  Have any leaves, snow, or ice cleared regularly.  Use sand or salt on walking paths during winter.  Clean up any spills in your garage right away. This includes oil or grease spills. What can I do in the bathroom?  Use night lights.  Install grab bars by the toilet and in the tub and shower. Do not use towel bars as grab bars.  Use non-skid mats or decals in the tub or shower.  If you need to sit down in the shower, use a plastic, non-slip stool.  Keep the floor dry. Clean up any water that spills on the  floor as soon as it happens.  Remove soap buildup in the tub or shower regularly.  Attach bath mats securely with double-sided non-slip rug tape.  Do not have throw rugs and other things on the floor that can make you trip. What can I do in the bedroom?  Use night lights.  Make sure that you have a light by your bed that is easy to reach.  Do not use any sheets or blankets that are too big for your bed. They should not hang down onto the floor.  Have a firm chair that has side arms. You can use this for support while you get dressed.  Do not have throw rugs and other things on the floor that can make you trip. What can I do in the kitchen?  Clean up any spills right away.  Avoid walking on wet floors.  Keep items that you use a lot in easy-to-reach places.  If you need to reach something above you, use a strong step stool that has a grab bar.  Keep electrical cords out of the way.  Do not use floor polish or wax that makes floors slippery. If you must use wax, use non-skid floor wax.  Do not have throw rugs  and other things on the floor that can make you trip. What can I do with my stairs?  Do not leave any items on the stairs.  Make sure that there are handrails on both sides of the stairs and use them. Fix handrails that are broken or loose. Make sure that handrails are as long as the stairways.  Check any carpeting to make sure that it is firmly attached to the stairs. Fix any carpet that is loose or worn.  Avoid having throw rugs at the top or bottom of the stairs. If you do have throw rugs, attach them to the floor with carpet tape.  Make sure that you have a light switch at the top of the stairs and the bottom of the stairs. If you do not have them, ask someone to add them for you. What else can I do to help prevent falls?  Wear shoes that:  Do not have high heels.  Have rubber bottoms.  Are comfortable and fit you well.  Are closed at the toe. Do not wear  sandals.  If you use a stepladder:  Make sure that it is fully opened. Do not climb a closed stepladder.  Make sure that both sides of the stepladder are locked into place.  Ask someone to hold it for you, if possible.  Clearly mark and make sure that you can see:  Any grab bars or handrails.  First and last steps.  Where the edge of each step is.  Use tools that help you move around (mobility aids) if they are needed. These include:  Canes.  Walkers.  Scooters.  Crutches.  Turn on the lights when you go into a dark area. Replace any light bulbs as soon as they burn out.  Set up your furniture so you have a clear path. Avoid moving your furniture around.  If any of your floors are uneven, fix them.  If there are any pets around you, be aware of where they are.  Review your medicines with your doctor. Some medicines can make you feel dizzy. This can increase your chance of falling. Ask your doctor what other things that you can do to help prevent falls. This information is not intended to replace advice given to you by your health care provider. Make sure you discuss any questions you have with your health care provider. Document Released: 02/26/2009 Document Revised: 10/08/2015 Document Reviewed: 06/06/2014 Elsevier Interactive Patient Education  2017 [employer].

## 2016-12-01 NOTE — Progress Notes (Signed)
Subjective:   Lauren Lloyd is a 76 y.o. female who presents for Medicare Annual (Subsequent) preventive examination.  Review of Systems:  N/A  Cardiac Risk Factors include: advanced age (>63men, >59 women);dyslipidemia;obesity (BMI >30kg/m2);sedentary lifestyle     Objective:     Vitals: BP (!) 148/80 (BP Location: Left Arm)   Pulse 72   Temp 99 F (37.2 C) (Oral)   Ht 5\' 7"  (1.702 m)   Wt 212 lb 3.2 oz (96.3 kg)   BMI 33.24 kg/m   Body mass index is 33.24 kg/m.   Tobacco History  Smoking Status  . Former Smoker  . Packs/day: 1.00  . Years: 30.00  . Types: Cigarettes  . Quit date: 05/17/1991  Smokeless Tobacco  . Never Used     Counseling given: Not Answered   Past Medical History:  Diagnosis Date  . Arthritis    knees, Hands  . GERD (gastroesophageal reflux disease)    Past Surgical History:  Procedure Laterality Date  . CATARACT EXTRACTION W/PHACO Right 01/25/2016   Procedure: CATARACT EXTRACTION PHACO AND INTRAOCULAR LENS PLACEMENT (IOC);  Surgeon: Ronnell Freshwater, MD;  Location: [place];  Service: Ophthalmology;  Laterality: Right;  RIGHT  . CATARACT EXTRACTION W/PHACO Left 02/22/2016   Procedure: CATARACT EXTRACTION PHACO AND INTRAOCULAR LENS PLACEMENT (IOC);  Surgeon: Ronnell Freshwater, MD;  Location: [place];  Service: Ophthalmology;  Laterality: Left;  LEFT  . [place]  2010  . KNEE ARTHROSCOPY Right 2004  . REPLACEMENT TOTAL KNEE Right 2009   [facility]  . SKIN GRAFT Left 04/08/2013   Done on left index finger  . TONSILLECTOMY  1946   Family History  Problem Relation Age of Onset  . Hyperlipidemia Sister   . Transient ischemic attack Mother   . Heart attack Father   . Healthy Brother   . Breast cancer Sister 62  . Hyperlipidemia Sister    History  Sexual Activity  . Sexual activity: Not on file    Outpatient Encounter Prescriptions as of 12/01/2016  Medication Sig  .  Multiple Vitamin (MULTIVITAMIN) capsule Take 1 capsule by mouth daily.  [name] Kitchen omeprazole (PRILOSEC) 20 MG capsule Take 1 capsule (20 mg total) by mouth 2 times daily at 12 noon and 4 pm. (Patient taking differently: Take 20 mg by mouth daily. )  . predniSONE (STERAPRED UNI-PAK 21 TAB) 10 MG (21) TBPK tablet Take 6 pills on day 1, then 5, 4, 3, 2, 1.  . VENTOLIN HFA 108 (90 Base) MCG/ACT inhaler Inhale 1-2 puffs into the lungs every 6 (six) hours as needed.    No facility-administered encounter medications on file as of 12/01/2016.     Activities of Daily Living In your present state of health, do you have any difficulty performing the following activities: 12/01/2016 02/22/2016  Hearing? N N  Vision? N N  Difficulty concentrating or making decisions? N N  Walking or climbing stairs? N N  Dressing or bathing? N N  Doing errands, shopping? N -  Preparing Food and eating ? N -  Using the Toilet? N -  In the past six months, have you accidently leaked urine? Y -  Do you have problems with loss of bowel control? Y -  Managing your Medications? N -  Managing your Finances? N -  Housekeeping or managing your Housekeeping? N -  Some recent data might be hidden    Patient Care Team: Jerrol Banana., MD  as PCP - General (Family Medicine) Wellington Hampshire, MD as Consulting Physician (Cardiology)    Assessment:     Exercise Activities and Dietary recommendations Current Exercise Habits: Structured exercise class;Home exercise routine, Type of exercise: walking;strength training/weights, Time (Minutes): 60, Frequency (Times/Week): 3, Weekly Exercise (Minutes/Week): 180, Intensity: Moderate, Exercise limited by: None identified  Goals    . Reduce portion size          Recommend decreasing portion sizes for each meal (3) and add in 2 healthy snacks in between.       Fall Risk Fall Risk  12/01/2016 11/04/2015 06/11/2015  Falls in the past year? Yes Yes No  Number falls in past yr: 2 or  more 2 or more -  Injury with Fall? No No -  Follow up Falls prevention discussed - -   Depression Screen PHQ 2/9 Scores 12/01/2016 12/01/2016 11/04/2015 06/11/2015  PHQ - 2 Score 0 0 0 0  PHQ- 9 Score 3 - - -     Cognitive Function     6CIT Screen 12/01/2016  What Year? 0 points  What month? 0 points  What time? 0 points  Count back from 20 0 points  Months in reverse 0 points  Repeat phrase 0 points  Total Score 0    Immunization History  Administered Date(s) Administered  . Hepatitis A 11/25/1999, 09/19/2001  . Hepatitis A, Adult 11/25/1999, 09/19/2001  . IPV 02/11/2000  . Pneumococcal Conjugate-13 10/06/2014  . Pneumococcal Polysaccharide-23 12/01/2010  . Td 02/02/1998  . Tdap 09/11/2007, 02/27/2012  . Typhoid Inactivated 02/11/2000   Screening Tests Health Maintenance  Topic Date Due  . INFLUENZA VACCINE  12/14/2016  . TETANUS/TDAP  02/26/2022  . DEXA SCAN  Completed  . PNA vac Low Risk Adult  Completed      Plan:  I have personally reviewed and addressed the Medicare Annual Wellness questionnaire and have noted the following in the patient's chart:  A. Medical and social history B. Use of alcohol, tobacco or illicit drugs  C. Current medications and supplements D. Functional ability and status E.  Nutritional status F.  Physical activity G. Advance directives H. List of other physicians I.  Hospitalizations, surgeries, and ER visits in previous 12 months J.  [place] such as hearing and vision if needed, cognitive and depression L. Referrals and appointments - none  In addition, I have reviewed and discussed with patient certain preventive protocols, quality metrics, and best practice recommendations. A written personalized care plan for preventive services as well as general preventive health recommendations were provided to patient.  See attached scanned questionnaire for additional information.   Signed,  Fabio Neighbors, LPN Nurse  Health Advisor   MD Recommendations: None.

## 2016-12-07 ENCOUNTER — Telehealth: Payer: Self-pay | Admitting: Cardiovascular Disease

## 2016-12-07 NOTE — Telephone Encounter (Signed)
Offered pt 7/27, 8:40am appt w/Dr. Fletcher Anon. States she is unable to accept appt as she is at the [employer] 

## 2016-12-14 ENCOUNTER — Ambulatory Visit (INDEPENDENT_AMBULATORY_CARE_PROVIDER_SITE_OTHER): Payer: Medicare HMO | Admitting: Family Medicine

## 2016-12-14 VITALS — BP 152/90 | HR 80 | Temp 98.1°F | Resp 16 | Ht 68.0 in | Wt 212.0 lb

## 2016-12-14 DIAGNOSIS — E6609 Other obesity due to excess calories: Secondary | ICD-10-CM

## 2016-12-14 DIAGNOSIS — Z6832 Body mass index (BMI) 32.0-32.9, adult: Secondary | ICD-10-CM | POA: Diagnosis not present

## 2016-12-14 DIAGNOSIS — K219 Gastro-esophageal reflux disease without esophagitis: Secondary | ICD-10-CM

## 2016-12-14 DIAGNOSIS — R0609 Other forms of dyspnea: Secondary | ICD-10-CM

## 2016-12-14 DIAGNOSIS — I10 Essential (primary) hypertension: Secondary | ICD-10-CM

## 2016-12-14 DIAGNOSIS — R0600 Dyspnea, unspecified: Secondary | ICD-10-CM

## 2016-12-14 DIAGNOSIS — E7800 Pure hypercholesterolemia, unspecified: Secondary | ICD-10-CM | POA: Diagnosis not present

## 2016-12-14 NOTE — Progress Notes (Signed)
Patient: Lauren Lloyd, Female    DOB: 1940/08/15, 76 y.o.   MRN: [id number] Visit Date: 12/14/2016  Today's Provider: [name], MD   Chief Complaint  Patient presents with  . Annual Exam   Subjective:   Patient is here for follow up of chronic and acute illnesses  Patient had elevated blood pressure today and when asked about a history of hypertension she stated that Dr. Venia Minks had prescribed something in the past but she did not like taking it.  She further stated that she did not want to take blood pressure medicine or statin medication.  Patient commented that she did not want to have the follow up on the cardiac work up due to the cost of it last time.    Immunization History  Administered Date(s) Administered  . Hepatitis A 11/25/1999, 09/19/2001  . Hepatitis A, Adult 11/25/1999, 09/19/2001  . IPV 02/11/2000  . Pneumococcal Conjugate-13 10/06/2014  . Pneumococcal Polysaccharide-23 12/01/2010  . Td 02/02/1998  . Tdap 09/11/2007, 02/27/2012  . Typhoid Inactivated 02/11/2000   04/20/2009 Colonoscopy ([facility])-Internal hemorrhoids 01/11/2016 Mammogram-normal 12/01/2010 Pap-Negative, patient reports she has never had an abnormal pap. 08/06/2013 Dexa Scan-normal  Review of Systems  Constitutional: Negative.   HENT: Negative.   Eyes: Negative.   Respiratory: Positive for shortness of breath (Patient has DOE.  She wsa referred to Cardio but does not go until September.  She was instructed she need to f/u on her previous cardiac work up but has not.  She was given Ventolin but is not using it.).   Cardiovascular: Negative.   Gastrointestinal: Positive for diarrhea (Daily, especially after coffee-she hasw beedn instructed in the past to use Metamucil but is not.).  Endocrine: Negative.   Genitourinary: Negative.   Musculoskeletal: Positive for arthralgias and neck stiffness.  Skin: Negative.   Allergic/Immunologic: Negative.   Hematological: Negative.    Psychiatric/Behavioral: Negative.     Patient Active Problem List   Diagnosis Date Noted  . Pulmonary hypertension ([place]) 04/02/2015  . Chest pain radiating to arm 02/17/2015  . Acid reflux 11/18/2014  . 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 Primary osteoarthritis of one hip 10/03/2011  . L-S radiculopathy 09/29/2011  . Arthritis of knee, degenerative 09/29/2011  . S/P knee replacement 09/29/2011  . Non-toxic uninodular goiter 06/20/2009  . Cervical pain 08/20/2008  . Cannot sleep 12/12/2007  . Hypercholesteremia 07/30/2007    Social History   Social History  . Marital status: Divorced    Spouse name: N/A  . Number of children: 5  . Years of education: H/S   Occupational History  . Part Time    Social History Main Topics  . Smoking status: Former Smoker    Packs/day: 1.00    Years: 30.00    Types: Cigarettes    Quit date: 05/17/1991  . Smokeless tobacco: Never Used  . Alcohol use 8.4 oz/week    7 Standard drinks or equivalent, 7 Glasses of wine per week  . Drug use: No  . Sexual activity: Not on file   Other Topics Concern  . Not on file   Social History Narrative  . No narrative on file    Past Surgical History:  Procedure Laterality Date  . CATARACT EXTRACTION W/PHACO Right 01/25/2016   Procedure: CATARACT EXTRACTION PHACO AND INTRAOCULAR LENS PLACEMENT (IOC);  Surgeon: Ronnell Freshwater, MD;  Location: [place];  Service: Ophthalmology;  Laterality: Right;  RIGHT  . CATARACT EXTRACTION W/PHACO Left 02/22/2016   Procedure: CATARACT EXTRACTION  PHACO AND INTRAOCULAR LENS PLACEMENT (IOC);  Surgeon: Ronnell Freshwater, MD;  Location: [place];  Service: Ophthalmology;  Laterality: Left;  LEFT  . [place]  2010  . KNEE ARTHROSCOPY Right 2004  . REPLACEMENT TOTAL KNEE Right 2009   [facility]  . SKIN GRAFT Left 04/08/2013   Done on left index finger  . TONSILLECTOMY  1946    Her family history includes  Breast cancer (age of onset: 81) in her sister; Healthy in her brother; Heart attack in her father; Hyperlipidemia in her sister and sister; Transient ischemic attack in her mother.     Outpatient Encounter Prescriptions as of 12/14/2016  Medication Sig Note  . Multiple Vitamin (MULTIVITAMIN) capsule Take 1 capsule by mouth daily.   [name] Kitchen omeprazole (PRILOSEC) 20 MG capsule Take 1 capsule (20 mg total) by mouth 2 times daily at 12 noon and 4 pm. (Patient taking differently: Take 20 mg by mouth daily. )   . VENTOLIN HFA 108 (90 Base) MCG/ACT inhaler Inhale 1-2 puffs into the lungs every 6 (six) hours as needed.  03/07/2016: Received from: External Pharmacy  . [DISCONTINUED] predniSONE (STERAPRED UNI-PAK 21 TAB) 10 MG (21) TBPK tablet Take 6 pills on day 1, then 5, 4, 3, 2, 1.    No facility-administered encounter medications on file as of 12/14/2016.     Allergies  Allergen Reactions  . Levofloxacin     Other reaction(s): Joint Pains  . Influenza Vaccines Other (See Comments)    Bell's Palsy  . Oysters [Shellfish Allergy] Swelling    She states she had eaten them three days in a row and she developed swelling around her eyes.     Patient Care Team: Jerrol Banana., MD as PCP - General (Family Medicine) Wellington Hampshire, MD as Consulting Physician (Cardiology)   Objective:   Vitals:  Vitals:   12/14/16 0932  BP: (!) 152/90  Pulse: 80  Resp: 16  Temp: 98.1 F (36.7 C)  TempSrc: Oral  Weight: 212 lb (96.2 kg)  Height: 5\' 8"  (1.727 m)    Physical Exam  Constitutional: She is oriented to person, place, and time. She appears well-developed and well-nourished.  HENT:  Head: Normocephalic and atraumatic.  Right Ear: External ear normal.  Left Ear: External ear normal.  Nose: Nose normal.  Mouth/Throat: Oropharynx is clear and moist.  Eyes: Pupils are equal, round, and reactive to light. Conjunctivae and EOM are normal.  Neck: Normal range of motion. Neck supple.   Cardiovascular: Normal rate, regular rhythm, normal heart sounds and intact distal pulses.   Pulmonary/Chest: Breath sounds normal.  Abdominal: Bowel sounds are normal.  Musculoskeletal: Normal range of motion.  Neurological: She is alert and oriented to person, place, and time.  Skin: Skin is warm and dry.  Psychiatric: She has a normal mood and affect. Her behavior is normal. Judgment and thought content normal.   .ep Activities of Daily Living In your present state of health, do you have any difficulty performing the following activities: 12/01/2016 02/22/2016  Hearing? N N  Vision? N N  Difficulty concentrating or making decisions? N N  Walking or climbing stairs? N N  Dressing or bathing? N N  Doing errands, shopping? N -  Preparing Food and eating ? N -  Using the Toilet? N -  In the past six months, have you accidently leaked urine? Y -  Comment occasionally -  Do you have problems with loss of  bowel control? Y -  Comment occasionally -  Managing your Medications? N -  Managing your Finances? N -  Housekeeping or managing your Housekeeping? N -  Some recent data might be hidden    Fall Risk Assessment Fall Risk  12/01/2016 11/04/2015 06/11/2015  Falls in the past year? Yes Yes No  Number falls in past yr: 2 or more 2 or more -  Comment tripped x 2 - -  Injury with Fall? No No -  Follow up Falls prevention discussed - -     Depression Screen PHQ 2/9 Scores 12/01/2016 12/01/2016 11/04/2015 06/11/2015  PHQ - 2 Score 0 0 0 0  PHQ- 9 Score 3 - - -   Home Exercise  12/01/2016 11/04/2015  Current Exercise Habits Structured exercise class;Home exercise routine Structured exercise class  Type of exercise walking;strength training/weights strength training/weights  Time (Minutes) 60 50  Frequency (Times/Week) 3 4  Weekly Exercise (Minutes/Week) 180 200  Intensity Moderate Mild  Exercise limited by: None identified -        Assessment & Plan:     Annual Wellness  Visit  Reviewed patient's Family Medical History Reviewed and updated list of patient's medical providers Assessment of cognitive impairment was done Assessed patient's functional ability Established a written schedule for health screening [place] Completed and Reviewed  Exercise Activities and Dietary recommendations Goals    . Reduce portion size          Recommend decreasing portion sizes for each meal (3) and add in 2 healthy snacks in between.        Immunization History  Administered Date(s) Administered  . Hepatitis A 11/25/1999, 09/19/2001  . Hepatitis A, Adult 11/25/1999, 09/19/2001  . IPV 02/11/2000  . Pneumococcal Conjugate-13 10/06/2014  . Pneumococcal Polysaccharide-23 12/01/2010  . Td 02/02/1998  . Tdap 09/11/2007, 02/27/2012  . Typhoid Inactivated 02/11/2000    Health Maintenance  Topic Date Due  . INFLUENZA VACCINE  12/14/2016  . TETANUS/TDAP  02/26/2022  . DEXA SCAN  Completed  . PNA vac Low Risk Adult  Completed    Follow up with Dr Geri Seminole this fall. Discussed health benefits of physical activity, and encouraged her to engage in regular exercise appropriate for her age and condition.  GERD DOE Cardiac w/u pending.  I have done the exam and reviewed the chart and it is accurate to the best of my knowledge. Development worker, community has been used and  any errors in dictation or transcription are unintentional. Miguel Aschoff M.D. [facility]

## 2016-12-28 DIAGNOSIS — E7800 Pure hypercholesterolemia, unspecified: Secondary | ICD-10-CM | POA: Diagnosis not present

## 2016-12-28 DIAGNOSIS — I10 Essential (primary) hypertension: Secondary | ICD-10-CM | POA: Diagnosis not present

## 2016-12-29 ENCOUNTER — Other Ambulatory Visit: Payer: Self-pay | Admitting: Family Medicine

## 2016-12-29 DIAGNOSIS — Z1231 Encounter for screening mammogram for malignant neoplasm of breast: Secondary | ICD-10-CM

## 2016-12-29 LAB — CBC WITH DIFFERENTIAL/PLATELET
Basophils Absolute: 0 10*3/uL (ref 0.0–0.2)
Basos: 1 %
EOS (ABSOLUTE): 0.2 10*3/uL (ref 0.0–0.4)
Eos: 4 %
HEMATOCRIT: 42.4 % (ref 34.0–46.6)
Hemoglobin: 14.1 g/dL (ref 11.1–15.9)
IMMATURE GRANULOCYTES: 0 %
Immature Grans (Abs): 0 10*3/uL (ref 0.0–0.1)
LYMPHS ABS: 2.5 10*3/uL (ref 0.7–3.1)
Lymphs: 41 %
MCH: 30 pg (ref 26.6–33.0)
MCHC: 33.3 g/dL (ref 31.5–35.7)
MCV: 90 fL (ref 79–97)
MONOCYTES: 10 %
MONOS ABS: 0.6 10*3/uL (ref 0.1–0.9)
NEUTROS ABS: 2.8 10*3/uL (ref 1.4–7.0)
Neutrophils: 44 %
Platelets: 276 10*3/uL (ref 150–379)
RBC: 4.7 x10E6/uL (ref 3.77–5.28)
RDW: 13.5 % (ref 12.3–15.4)
WBC: 6.2 10*3/uL (ref 3.4–10.8)

## 2016-12-29 LAB — COMPREHENSIVE METABOLIC PANEL
A/G RATIO: 1.7 (ref 1.2–2.2)
ALT: 11 IU/L (ref 0–32)
AST: 19 IU/L (ref 0–40)
Albumin: 4 g/dL (ref 3.5–4.8)
Alkaline Phosphatase: 111 IU/L (ref 39–117)
BILIRUBIN TOTAL: 0.3 mg/dL (ref 0.0–1.2)
BUN/Creatinine Ratio: 23 (ref 12–28)
BUN: 16 mg/dL (ref 8–27)
CALCIUM: 9.2 mg/dL (ref 8.7–10.3)
CO2: 23 mmol/L (ref 20–29)
Chloride: 107 mmol/L — ABNORMAL HIGH (ref 96–106)
Creatinine, Ser: 0.71 mg/dL (ref 0.57–1.00)
GFR calc Af Amer: 95 mL/min/{1.73_m2} (ref >59)
GFR, EST NON AFRICAN AMERICAN: 82 mL/min/{1.73_m2} (ref >59)
GLOBULIN, TOTAL: 2.4 g/dL (ref 1.5–4.5)
Glucose: 94 mg/dL (ref 65–99)
POTASSIUM: 5 mmol/L (ref 3.5–5.2)
SODIUM: 143 mmol/L (ref 134–144)
Total Protein: 6.4 g/dL (ref 6.0–8.5)

## 2016-12-29 LAB — LIPID PANEL WITH LDL/HDL RATIO
Cholesterol, Total: 269 mg/dL — ABNORMAL HIGH (ref 100–199)
HDL: 67 mg/dL (ref >39)
LDL Calculated: 169 mg/dL — ABNORMAL HIGH (ref 0–99)
LDL/HDL RATIO: 2.5 ratio (ref 0.0–3.2)
TRIGLYCERIDES: 163 mg/dL — AB (ref 0–149)
VLDL Cholesterol Cal: 33 mg/dL (ref 5–40)

## 2016-12-29 LAB — TSH: TSH: 1.33 u[IU]/mL (ref 0.450–4.500)

## 2017-01-03 ENCOUNTER — Encounter: Payer: Self-pay | Admitting: Family Medicine

## 2017-01-03 ENCOUNTER — Ambulatory Visit (INDEPENDENT_AMBULATORY_CARE_PROVIDER_SITE_OTHER): Payer: Medicare HMO | Admitting: Family Medicine

## 2017-01-03 VITALS — BP 182/82 | HR 72 | Temp 97.7°F | Resp 16 | Ht 68.0 in | Wt 212.0 lb

## 2017-01-03 DIAGNOSIS — E7800 Pure hypercholesterolemia, unspecified: Secondary | ICD-10-CM

## 2017-01-03 DIAGNOSIS — G8929 Other chronic pain: Secondary | ICD-10-CM | POA: Diagnosis not present

## 2017-01-03 DIAGNOSIS — M25511 Pain in right shoulder: Secondary | ICD-10-CM

## 2017-01-03 DIAGNOSIS — Z0000 Encounter for general adult medical examination without abnormal findings: Secondary | ICD-10-CM | POA: Insufficient documentation

## 2017-01-03 DIAGNOSIS — K649 Unspecified hemorrhoids: Secondary | ICD-10-CM | POA: Diagnosis not present

## 2017-01-03 DIAGNOSIS — I10 Essential (primary) hypertension: Secondary | ICD-10-CM | POA: Diagnosis not present

## 2017-01-03 MED ORDER — VENTOLIN HFA 108 (90 BASE) MCG/ACT IN AERS: 1.0000 | INHALATION_SPRAY | Freq: Four times a day (QID) | RESPIRATORY_TRACT | 3 refills | Status: DC | PRN

## 2017-01-03 MED ORDER — HYDROCHLOROTHIAZIDE 12.5 MG PO CAPS: 12.5000 mg | ORAL_CAPSULE | Freq: Every day | ORAL | 1 refills | Status: DC

## 2017-01-03 NOTE — Progress Notes (Signed)
Patient: Lauren Lloyd, Female    DOB: 1940/07/18, 76 y.o.   MRN: [id number] Visit Date: 01/03/2017  Today's Provider: Lavon Paganini, MD   Chief Complaint  Patient presents with  . Annual Exam   Subjective:    Annual physical exam Lauren Lloyd is a 76 y.o. female who presents today for health maintenance and complete physical. She feels well. She reports exercising 5 days a week. She walks for 3 miles 3 times a week, and works with a trainer twice weekly. She reports she is sleeping poorly. Sleeps for 3 hours before prematurely awakening.   She had her AWV on 12/01/2016. Last mammogram- 01/11/2016- BI-RADS 1. Is scheduled for September 5th. Last colonoscopy- 02/20/2014- internal hemorrhoids. Repeat 10 years.   She recently has labs checked last week. She infomed Dr. Rosanna Randy that she will not take a statin. -----------------------------------------------------------------   BRBPR - has long history of bleeding hemorrhoids - notices after wiping after BM - does have some fecal urgency after AM cup of coffee - does not think she is constipated - thinks she might have done better when taking Metamucil  R shoulder pain - worse in the AM, especially when trying to pour water from kettle int ocup - working with trainer - problematic for 3-4 months - tried no medications  Review of Systems  Constitutional: Negative.   HENT: Negative.   Eyes: Negative.   Respiratory: Positive for shortness of breath (Unchanged per pt. F/B Dr. Rosanna Randy; seeing cardio in October). Negative for apnea, cough, choking, chest tightness, wheezing and stridor.   Cardiovascular: Negative.   Gastrointestinal: Positive for diarrhea. Negative for abdominal distention, abdominal pain, anal bleeding, blood in stool, constipation, nausea, rectal pain and vomiting.  Endocrine: Negative.   Genitourinary: Negative.   Musculoskeletal: Positive for arthralgias (right shoulder). Negative for back pain, gait  problem, joint swelling, myalgias, neck pain and neck stiffness.  Skin: Negative.   Allergic/Immunologic: Negative.   Neurological: Negative.   Hematological: Negative.   Psychiatric/Behavioral: Negative.     Social History      She  reports that she quit smoking about 25 years ago. Her smoking use included Cigarettes. She has a 30.00 pack-year smoking history. She has never used smokeless tobacco. She reports that she drinks about 8.4 oz of alcohol per week . She reports that she does not use drugs.       Social History   Social History  . Marital status: Divorced    Spouse name: N/A  . Number of children: 5  . Years of education: college   Occupational History  . Part Time   .  [employer]   Social History Main Topics  . Smoking status: Former Smoker    Packs/day: 1.00    Years: 30.00    Types: Cigarettes    Quit date: 05/17/1991  . Smokeless tobacco: Never Used  . Alcohol use 8.4 oz/week    7 Glasses of wine, 7 Standard drinks or equivalent per week  . Drug use: No  . Sexual activity: Not Currently   Other Topics Concern  . None   Social History Narrative   Pt has a child who passed away at age 19    Past Medical History:  Diagnosis Date  . Arthritis    knees, Hands  . GERD (gastroesophageal reflux disease)      Patient Active Problem List   Diagnosis Date Noted  . Pulmonary hypertension ([place]) 04/02/2015  . Chest pain  radiating to arm 02/17/2015  . Acid reflux 11/18/2014  . 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 Primary osteoarthritis of one hip 10/03/2011  . L-S radiculopathy 09/29/2011  . Arthritis of knee, degenerative 09/29/2011  . S/P knee replacement 09/29/2011  . Non-toxic uninodular goiter 06/20/2009  . Cervical pain 08/20/2008  . Cannot sleep 12/12/2007  . Hypercholesteremia 07/30/2007    Past Surgical History:  Procedure Laterality Date  . CATARACT EXTRACTION W/PHACO Right 01/25/2016   Procedure: CATARACT EXTRACTION PHACO AND INTRAOCULAR  LENS PLACEMENT (IOC);  Surgeon: Ronnell Freshwater, MD;  Location: [place];  Service: Ophthalmology;  Laterality: Right;  RIGHT  . CATARACT EXTRACTION W/PHACO Left 02/22/2016   Procedure: CATARACT EXTRACTION PHACO AND INTRAOCULAR LENS PLACEMENT (IOC);  Surgeon: Ronnell Freshwater, MD;  Location: [place];  Service: Ophthalmology;  Laterality: Left;  LEFT  . [place]  2010  . KNEE ARTHROSCOPY Right 2004  . REPLACEMENT TOTAL KNEE Right 2009   [facility]  . SKIN GRAFT Left 04/08/2013   Done on left index finger  . TONSILLECTOMY  1946    Family History        Family Status  Relation Status  . Sister Alive  . Mother Deceased at age 58  . Father Deceased at age 4       Heart Attack  . Brother Alive  . Sister Alive  . Son Deceased       Accidental Drowning        Her family history includes Atrial fibrillation in her sister and sister; Breast cancer (age of onset: 8) in her sister; Healthy in her brother; Heart attack in her father; Hyperlipidemia in her sister and sister; Transient ischemic attack in her mother.     Allergies  Allergen Reactions  . Levofloxacin     Other reaction(s): Joint Pains  . Influenza Vaccines Other (See Comments)    Bell's Palsy  . Oysters [Shellfish Allergy] Swelling    She states she had eaten them three days in a row and she developed swelling around her eyes.      Current Outpatient Prescriptions:  [name] Kitchen  Multiple Vitamin (MULTIVITAMIN) capsule, Take 1 capsule by mouth daily., Disp: , Rfl:  .  ranitidine (ZANTAC) 75 MG tablet, Take 75-150 mg by mouth 2 (two) times daily as needed for heartburn., Disp: , Rfl:  .  omeprazole (PRILOSEC) 20 MG capsule, Take 1 capsule (20 mg total) by mouth 2 times daily at 12 noon and 4 pm. (Patient not taking: Reported on 01/03/2017), Disp: 180 capsule, Rfl: 3 .  VENTOLIN HFA 108 (90 Base) MCG/ACT inhaler, Inhale 1-2 puffs into the lungs every 6 (six) hours as  needed. , Disp: , Rfl:    Patient Care Team: [name], MD as PCP - General (Family Medicine) Wellington Hampshire, MD as Consulting Physician (Cardiology)      Objective:   Vitals: BP (!) 182/82 (BP Location: Left Arm, Patient Position: Sitting, Cuff Size: Large)   Pulse 72   Temp 97.7 F (36.5 C) (Oral)   Resp 16   Ht 5\' 8"  (1.727 m)   Wt 212 lb (96.2 kg)   BMI 32.23 kg/m    Vitals:   01/03/17 1422  BP: (!) 182/82  Pulse: 72  Resp: 16  Temp: 97.7 F (36.5 C)  TempSrc: Oral  Weight: 212 lb (96.2 kg)  Height: 5\' 8"  (1.727 m)     Physical Exam  Constitutional: She is oriented to  person, place, and time. She appears well-developed and well-nourished. No distress.  HENT:  Head: Normocephalic and atraumatic.  Right Ear: External ear normal.  Left Ear: External ear normal.  Nose: Nose normal.  Mouth/Throat: Oropharynx is clear and moist.  Eyes: Pupils are equal, round, and reactive to light. Conjunctivae and EOM are normal. No scleral icterus.  Neck: Neck supple. No thyromegaly present.  Cardiovascular: Normal rate, regular rhythm, normal heart sounds and intact distal pulses.   No murmur heard. Pulmonary/Chest: Effort normal and breath sounds normal. No respiratory distress. She has no wheezes. She has no rales.  Abdominal: Soft. Bowel sounds are normal. She exhibits no distension. There is no tenderness. There is no rebound and no guarding.  Musculoskeletal: She exhibits no edema or deformity.  R shoulder: Full range of motion. No tenderness to palpation over bony landmarks. She'll contact us in abduction, flexion, extension, internal and external rotation. Sensation intact to light touch throughout upper extremity. Negative Hawkins and empty can testing.  Lymphadenopathy:    She has no cervical adenopathy.  Neurological: She is alert and oriented to person, place, and time. No cranial nerve deficit.  Skin: Skin is warm and dry. No rash noted.  Psychiatric: She  has a normal mood and affect. Her behavior is normal.  Vitals reviewed.    Depression Screen PHQ 2/9 Scores 12/01/2016 12/01/2016 11/04/2015 06/11/2015  PHQ - 2 Score 0 0 0 0  PHQ- 9 Score 3 - - -    Assessment & Plan:     Routine Health Maintenance and Physical Exam  Exercise Activities and Dietary recommendations Goals    . Reduce portion size          Recommend decreasing portion sizes for each meal (3) and add in 2 healthy snacks in between.        Immunization History  Administered Date(s) Administered  . Hepatitis A 11/25/1999, 09/19/2001  . Hepatitis A, Adult 11/25/1999, 09/19/2001  . IPV 02/11/2000  . Pneumococcal Conjugate-13 10/06/2014  . Pneumococcal Polysaccharide-23 12/01/2010  . Td 02/02/1998  . Tdap 09/11/2007, 02/27/2012  . Typhoid Inactivated 02/11/2000    Health Maintenance  Topic Date Due  . INFLUENZA VACCINE  12/14/2016  . TETANUS/TDAP  02/26/2022  . DEXA SCAN  Completed  . PNA vac Low Risk Adult  Completed     Discussed health benefits of physical activity, and encouraged her to engage in regular exercise appropriate for her age and condition.   Hypertension Uncontrolled - no red flag symptoms Previously hesistant to take any medications Agrees to start HCTZ 12.5 mg daily Recent CMP wnl Discussed lifestyle interventions F/u in 2 wks and consider titration of dose  Hemorrhoids Advised on resuming fiber supplement Could consider banding in the future if continue to be problematic  Hypercholesteremia Discussed with patient that statin therapy is indicated ASCVD 10 yr risk is 27% She remains hesitant to take statin therapy We will continue to address  Healthcare maintenance UTD on screenings and vaccinations Mammogram scheduled for next month  Chronic right shoulder pain No evidence of rotator cuff pathology No limit in function Likely tendonopathy that flairs occasionally Conservative management       --------------------------------------------------------------------  [name], [name]. Portions of this information were initially documented by [name], CMA and reviewed by me for thoroughness and accuracy.    Lavon Paganini, MD  [facility]

## 2017-01-03 NOTE — Assessment & Plan Note (Signed)
Discussed with patient that statin therapy is indicated ASCVD 10 yr risk is 27% She remains hesitant to take statin therapy We will continue to address

## 2017-01-03 NOTE — Assessment & Plan Note (Signed)
Uncontrolled - no red flag symptoms Previously hesistant to take any medications Agrees to start HCTZ 12.5 mg daily Recent CMP wnl Discussed lifestyle interventions F/u in 2 wks and consider titration of dose

## 2017-01-03 NOTE — Assessment & Plan Note (Signed)
UTD on screenings and vaccinations Mammogram scheduled for next month

## 2017-01-03 NOTE — Patient Instructions (Signed)

## 2017-01-03 NOTE — Assessment & Plan Note (Signed)
Advised on resuming fiber supplement Could consider banding in the future if continue to be problematic

## 2017-01-03 NOTE — Assessment & Plan Note (Signed)
No evidence of rotator cuff pathology No limit in function Likely tendonopathy that flairs occasionally Conservative management

## 2017-01-17 ENCOUNTER — Ambulatory Visit: Payer: Self-pay | Admitting: Cardiovascular Disease

## 2017-01-19 ENCOUNTER — Encounter: Payer: Self-pay | Admitting: Family Medicine

## 2017-01-19 ENCOUNTER — Ambulatory Visit (INDEPENDENT_AMBULATORY_CARE_PROVIDER_SITE_OTHER): Payer: Medicare HMO | Admitting: Family Medicine

## 2017-01-19 VITALS — BP 130/66 | HR 80 | Temp 98.1°F | Resp 16 | Ht 68.0 in | Wt 211.6 lb

## 2017-01-19 DIAGNOSIS — I10 Essential (primary) hypertension: Secondary | ICD-10-CM | POA: Diagnosis not present

## 2017-01-19 MED ORDER — HYDROCHLOROTHIAZIDE 12.5 MG PO CAPS: 12.5000 mg | ORAL_CAPSULE | Freq: Every day | ORAL | 1 refills | Status: DC

## 2017-01-19 NOTE — Assessment & Plan Note (Signed)
Well controlled Continue HCTZ 12.5 mg daily Recheck BMP to evaluate renal function after starting diuretic F/u in 3 months Discussed lifestyle interventions

## 2017-01-19 NOTE — Progress Notes (Signed)
Patient: Lauren Lloyd Female    DOB: Aug 30, 1940   76 y.o.   MRN: [id number] Visit Date: 01/19/2017  Today's Provider: Lavon Paganini, MD   Chief Complaint  Patient presents with  . Hypertension   Subjective:    HPI  Follow up for hypertension  The patient was last seen for this 2 weeks ago. Changes made at last visit include start HCTZ 12.5 mg daily.  Patient reports she is not checking her blood pressure at home. Patient denies chest pain or swelling around feet or ankles.  She reports excellent compliance with treatment. She feels that condition is Unchanged. She is not having side effects.   ------------------------------------------------------------------------------------     Allergies  Allergen Reactions  . Levofloxacin     Other reaction(s): Joint Pains  . Influenza Vaccines Other (See Comments)    Bell's Palsy  . Oysters [Shellfish Allergy] Swelling    She states she had eaten them three days in a row and she developed swelling around her eyes.      Current Outpatient Prescriptions:  .  hydrochlorothiazide (MICROZIDE) 12.5 MG capsule, Take 1 capsule (12.5 mg total) by mouth daily., Disp: 90 capsule, Rfl: 1 .  Multiple Vitamin (MULTIVITAMIN) capsule, Take 1 capsule by mouth daily., Disp: , Rfl:  .  omeprazole (PRILOSEC) 20 MG capsule, Take 1 capsule (20 mg total) by mouth 2 times daily at 12 noon and 4 pm., Disp: 180 capsule, Rfl: 3 .  ranitidine (ZANTAC) 75 MG tablet, Take 75-150 mg by mouth 2 (two) times daily as needed for heartburn., Disp: , Rfl:  .  VENTOLIN HFA 108 (90 Base) MCG/ACT inhaler, Inhale 1-2 puffs into the lungs every 6 (six) hours as needed for shortness of breath., Disp: 1 Inhaler, Rfl: 3  Review of Systems  Constitutional: Negative.   Respiratory: Negative.   Cardiovascular: Negative.   Gastrointestinal: Negative.   Endocrine: Negative.   Genitourinary: Negative.   Musculoskeletal: Negative.   Neurological: Negative.      Social History  Substance Use Topics  . Smoking status: Former Smoker    Packs/day: 1.00    Years: 30.00    Types: Cigarettes    Quit date: 05/17/1991  . Smokeless tobacco: Never Used  . Alcohol use 8.4 oz/week    7 Glasses of wine, 7 Standard drinks or equivalent per week   Objective:   BP 130/66 (BP Location: Left Arm, Patient Position: Sitting, Cuff Size: Large)   Pulse 80   Temp 98.1 F (36.7 C) (Oral)   Resp 16   Ht 5\' 8"  (1.727 m)   Wt 211 lb 9.6 oz (96 kg)   BMI 32.17 kg/m  Vitals:   01/19/17 1441  BP: 130/66  Pulse: 80  Resp: 16  Temp: 98.1 F (36.7 C)  TempSrc: Oral  Weight: 211 lb 9.6 oz (96 kg)  Height: 5\' 8"  (1.727 m)     Physical Exam  Constitutional: She is oriented to person, place, and time. She appears well-developed and well-nourished. No distress.  HENT:  Head: Normocephalic and atraumatic.  Eyes: Conjunctivae are normal. No scleral icterus.  Cardiovascular: Normal rate, regular rhythm, normal heart sounds and intact distal pulses.   No murmur heard. Pulmonary/Chest: Effort normal and breath sounds normal. No respiratory distress. She has no wheezes. She has no rales.  Musculoskeletal: She exhibits no edema or deformity.  Neurological: She is alert and oriented to person, place, and time.  Skin: Skin is warm and dry. No  rash noted.  Psychiatric: She has a normal mood and affect. Her behavior is normal.  Vitals reviewed.      Assessment & Plan:     Hypertension Well controlled Continue HCTZ 12.5 mg daily Recheck BMP to evaluate renal function after starting diuretic F/u in 3 months Discussed lifestyle interventions       [name], [name]. Portions of this information were initially documented by Lynford Humphrey, CMA and reviewed by me for thoroughness and accuracy.     Lavon Paganini, MD  [facility]

## 2017-01-20 ENCOUNTER — Telehealth: Payer: Self-pay

## 2017-01-20 ENCOUNTER — Telehealth: Payer: Self-pay | Admitting: Emergency Medicine

## 2017-01-20 LAB — BASIC METABOLIC PANEL WITH GFR
BUN: 20 mg/dL (ref 7–25)
CO2: 30 mmol/L (ref 20–32)
Calcium: 9.1 mg/dL (ref 8.6–10.4)
Chloride: 105 mmol/L (ref 98–110)
Creat: 0.78 mg/dL (ref 0.60–0.93)
GFR, Est African American: 85 mL/min/{1.73_m2} (ref >=60)
GFR, Est Non African American: 73 mL/min/{1.73_m2} (ref >=60)
GLUCOSE: 95 mg/dL (ref 65–99)
Potassium: 3.7 mmol/L (ref 3.5–5.3)
Sodium: 143 mmol/L (ref 135–146)

## 2017-01-20 MED ORDER — HYDROCHLOROTHIAZIDE 12.5 MG PO CAPS: 12.5000 mg | ORAL_CAPSULE | Freq: Every day | ORAL | 1 refills | Status: DC

## 2017-01-20 NOTE — Telephone Encounter (Signed)
Pt advised and acknowledges understanding.  

## 2017-01-20 NOTE — Telephone Encounter (Signed)
-----   Message from [name], MD sent at 01/20/2017  8:43 AM EDT ----- Normal electrolytes and kidney function.  [name], MD, MPH [facility] 01/20/2017 8:43 AM

## 2017-01-20 NOTE — Telephone Encounter (Signed)
Pt would like her HCTZ sent to [facility] and the rx that was sent to [facility] cancelled. This was done... Just letting you know. :)

## 2017-01-23 ENCOUNTER — Ambulatory Visit
Admission: RE | Admit: 2017-01-23 | Discharge: 2017-01-23 | Disposition: A | Discharge status: Home / Self Care | Payer: Medicare HMO | Source: Ambulatory Visit | Attending: Family Medicine | Admitting: Family Medicine

## 2017-01-23 DIAGNOSIS — Z1231 Encounter for screening mammogram for malignant neoplasm of breast: Secondary | ICD-10-CM | POA: Diagnosis not present

## 2017-02-11 ENCOUNTER — Telehealth: Payer: Self-pay

## 2017-02-11 NOTE — Telephone Encounter (Signed)
Patient called saying that she woke up feeling like she was having menstrual cramps early this morning (around 2 am). After urinating, she noticed that she had brown discharge. When she woke up around 7 and she now has bright red discharge. She denies any dizziness or lightheadedness. She reports that it is a small amount of blood. However, she is concerned because she has not had a period in several years.  Discussed this with Tawanna Sat and she recommended that if bleeding gets worse that she needs to go to the ER. Patient will ultimately need a GYN referral. Since patient has no other symptoms, it was recommended that she schedule appt with Dr. B for further evaluation and possible GYN referral. Patient is currently out of town, and she will be back on Monday. Appt was scheduled for Monday with Dr. Jacinto Reap.

## 2017-02-13 ENCOUNTER — Ambulatory Visit (INDEPENDENT_AMBULATORY_CARE_PROVIDER_SITE_OTHER): Payer: Medicare HMO | Admitting: Family Medicine

## 2017-02-13 ENCOUNTER — Encounter: Payer: Self-pay | Admitting: Family Medicine

## 2017-02-13 VITALS — BP 154/76 | HR 80 | Temp 98.2°F | Resp 16 | Wt 210.0 lb

## 2017-02-13 DIAGNOSIS — R310 Gross hematuria: Secondary | ICD-10-CM | POA: Diagnosis not present

## 2017-02-13 DIAGNOSIS — N3090 Cystitis, unspecified without hematuria: Secondary | ICD-10-CM | POA: Diagnosis not present

## 2017-02-13 LAB — POCT URINALYSIS DIPSTICK
Bilirubin, UA: NEGATIVE
Glucose, UA: NEGATIVE
Ketones, UA: NEGATIVE
NITRITE UA: POSITIVE
PH UA: 5 (ref 5.0–8.0)
PROTEIN UA: NEGATIVE
SPEC GRAV UA: 1.02 (ref 1.010–1.025)
UROBILINOGEN UA: 0.2 U/dL

## 2017-02-13 MED ORDER — SULFAMETHOXAZOLE-TRIMETHOPRIM 800-160 MG PO TABS: 1.0000 | ORAL_TABLET | Freq: Two times a day (BID) | ORAL | 0 refills | Status: AC

## 2017-02-13 NOTE — Patient Instructions (Signed)

## 2017-02-13 NOTE — Progress Notes (Signed)
Patient: Lauren Lloyd Female    DOB: [birth year]   76 y.o.   MRN: [id number] Visit Date: 02/13/2017  Today's Provider: Lavon Paganini, MD   Chief Complaint  Patient presents with  . Hematuria   Subjective:    Hematuria  This is a new problem. Episode onset: x 3 days; started with involunatry urination on Friday; hematuria began Saturday. The problem has been gradually improving (has happened once in the mornings on Saturday and Sunday morning. No urine this morning.) since onset. She describes the hematuria as gross hematuria. She reports no clotting in her urine stream. Her pain is at a severity of 3/10. The pain is mild ("like a menstrual cramp" on Friday and Saturday morning). Urine color: "blood red" Irritative symptoms include frequency, nocturia and urgency. Obstructive symptoms include an intermittent stream. Obstructive symptoms do not include dribbling, incomplete emptying, a slower stream, straining or a weak stream. Associated symptoms include abdominal pain, chills, dysuria and flank pain. Pertinent negatives include no fever, genital pain, hesitancy, inability to urinate, nausea or vomiting. She is not sexually active. Her past medical history is significant for hypertension. There is no history of kidney stones, recent infection or tobacco use (pt quit smoking in 1991; smoked for about 30 years).   States this episode started with urinary incontinence episode last Friday night. She's never had an episode like this previously. She noticed hematuria after urinating multiple times overnight Friday night. This is seen to get a little bit better. She thinks she may of had a fever, though did not check her temperature. She also had chills. She denies any flank pain. She does not believe this is vaginal bleeding as she does not find any on her pad in between urination.    Allergies  Allergen Reactions  . Levofloxacin     Other reaction(s): Joint Pains  . Influenza Vaccines Other  (See Comments)    Bell's Palsy  . Oysters [Shellfish Allergy] Swelling    She states she had eaten them three days in a row and she developed swelling around her eyes.      Current Outpatient Prescriptions:  .  hydrochlorothiazide (MICROZIDE) 12.5 MG capsule, Take 1 capsule (12.5 mg total) by mouth daily., Disp: 90 capsule, Rfl: 1 .  Multiple Vitamin (MULTIVITAMIN) capsule, Take 1 capsule by mouth daily., Disp: , Rfl:  .  omeprazole (PRILOSEC) 20 MG capsule, Take 1 capsule (20 mg total) by mouth 2 times daily at 12 noon and 4 pm. (Patient not taking: Reported on 02/13/2017), Disp: 180 capsule, Rfl: 3 .  VENTOLIN HFA 108 (90 Base) MCG/ACT inhaler, Inhale 1-2 puffs into the lungs every 6 (six) hours as needed for shortness of breath. (Patient not taking: Reported on 02/13/2017), Disp: 1 Inhaler, Rfl: 3  Review of Systems  Constitutional: Positive for chills. Negative for fever.  Respiratory: Negative.   Cardiovascular: Negative.   Gastrointestinal: Positive for abdominal pain. Negative for nausea and vomiting.  Genitourinary: Positive for dysuria, flank pain, frequency, hematuria, nocturia and urgency. Negative for hesitancy, incomplete emptying, vaginal bleeding and vaginal discharge.  Neurological: Negative.   Psychiatric/Behavioral: Negative.     Social History  Substance Use Topics  . Smoking status: Former Smoker    Packs/day: 1.00    Years: 30.00    Types: Cigarettes    Quit date: 05/16/1989  . Smokeless tobacco: Never Used  . Alcohol use 8.4 oz/week    7 Glasses of wine, 7 Standard drinks or equivalent per week  Objective:   BP (!) 154/76 (BP Location: Left Arm, Patient Position: Sitting, Cuff Size: Large)   Pulse 80   Temp 98.2 F (36.8 C) (Oral)   Resp 16   Wt 210 lb (95.3 kg)   BMI 31.93 kg/m  Vitals:   02/13/17 1605  BP: (!) 154/76  Pulse: 80  Resp: 16  Temp: 98.2 F (36.8 C)  TempSrc: Oral  Weight: 210 lb (95.3 kg)     Physical Exam  Constitutional: She  is oriented to person, place, and time. She appears well-developed and well-nourished. No distress.  HENT:  Head: Normocephalic and atraumatic.  Cardiovascular: Normal rate, regular rhythm, normal heart sounds and intact distal pulses.   No murmur heard. Pulmonary/Chest: Effort normal and breath sounds normal. No respiratory distress. She has no wheezes. She has no rales.  Abdominal: Soft. Bowel sounds are normal. She exhibits no distension. There is no tenderness. There is no rebound and no guarding.  No CVAT  Musculoskeletal: She exhibits no edema or deformity.  Neurological: She is alert and oriented to person, place, and time.  Skin: Skin is warm and dry. No rash noted.  Psychiatric: She has a normal mood and affect. Her behavior is normal.  Vitals reviewed.  Results for orders placed or performed in visit on 02/13/17  POCT urinalysis dipstick  Result Value Ref Range   Color, UA yellow    Clarity, UA clear    Glucose, UA Negative    Bilirubin, UA Negative    Ketones, UA Negative    Spec Grav, UA 1.020 1.010 - 1.025   Blood, UA Hemolyzed Trace    pH, UA 5.0 5.0 - 8.0   Protein, UA Negative    Urobilinogen, UA 0.2 0.2 or 1.0 E.U./dL   Nitrite, UA Positive    Leukocytes, UA Small (1+) (A) Negative        Assessment & Plan:     1. Gross hematuria - Long discussion with patient regarding whether this was blood in her urine or vaginal bleeding that she is seen. Given that there is no bleeding in between urination, doubt vaginal bleeding at this time. Also more concerning for hematuria given UTI symptoms as above. Advised patient that if this continues despite treatment for UTI, we will plan to do a pelvic exam and possible pelvic ultrasound to assess for postmenopausal bleeding - POCT urinalysis dipstick - Urine Culture - Follow-up in 6 weeks after course of antibiotics for UTI to recheck urine and ensure hematuria has resolved - Not enough urine available in sample to send for  urine microscopy, so we will send at next visit if any blood is detected on dipstick  2. Cystitis - Symptoms and urinalysis consistent with cystitis with hematuria - No signs or symptoms of pyelonephritis or nephrolithiasis - We will treat with 5 day course of Bactrim - Discussed return precautions - Urine Culture  Meds ordered this encounter  Medications  . sulfamethoxazole-trimethoprim (BACTRIM DS,SEPTRA DS) 800-160 MG tablet    Sig: Take 1 tablet by mouth 2 (two) times daily.    Dispense:  10 tablet    Refill:  0       [name], [name]. Portions of this information were initially documented by [name], CMA and reviewed by me for thoroughness and accuracy.    Lavon Paganini, MD  [facility]

## 2017-02-14 ENCOUNTER — Encounter: Payer: Self-pay | Admitting: Cardiovascular Disease

## 2017-02-14 ENCOUNTER — Ambulatory Visit (INDEPENDENT_AMBULATORY_CARE_PROVIDER_SITE_OTHER): Payer: Medicare HMO | Admitting: Cardiovascular Disease

## 2017-02-14 VITALS — BP 180/76 | HR 82 | Ht 68.0 in | Wt 211.2 lb

## 2017-02-14 DIAGNOSIS — R0602 Shortness of breath: Secondary | ICD-10-CM

## 2017-02-14 DIAGNOSIS — I10 Essential (primary) hypertension: Secondary | ICD-10-CM | POA: Diagnosis not present

## 2017-02-14 DIAGNOSIS — E7800 Pure hypercholesterolemia, unspecified: Secondary | ICD-10-CM | POA: Diagnosis not present

## 2017-02-14 DIAGNOSIS — I272 Pulmonary hypertension, unspecified: Secondary | ICD-10-CM

## 2017-02-14 DIAGNOSIS — I2510 Atherosclerotic heart disease of native coronary artery without angina pectoris: Secondary | ICD-10-CM

## 2017-02-14 MED ORDER — ROSUVASTATIN CALCIUM 5 MG PO TABS: 5.0000 mg | ORAL_TABLET | Freq: Every day | ORAL | 3 refills | Status: DC

## 2017-02-14 NOTE — Progress Notes (Signed)
Cardiology Office Note   Date:  02/14/2017   ID:  Lauren Lloyd, Lauren Lloyd March 22, 1941, MRN [id number]  PCP:  [name], MD  Cardiologist:   Kathlyn Sacramento, MD   Chief Complaint  Patient presents with  . other    Patient was Last seen in 2016. Patient c/o SOB on and off. Meds reviewed verbally with patient.       History of Present Illness: Lauren Lloyd is a 76 y.o. female who presents To reestablish cardiovascular care. She was seen by me in 2016 for atypical chest pain and shortness of breath.  She is known to have history of hyperlipidemia but she is resistant to starting treatment with a statin  She quit smoking about 25 years ago. Her father died at the age of 66 of myocardial infarction but overall there is no family history of premature coronary artery disease.  Workup in 2016 included CT scan of the chest which showed no evidence of pulmonary embolism. There was mild coronary calcifications. Treadmill nuclear stress test showed no evidence of ischemia with normal ejection fraction. Echocardiogram showed normal LV systolic function with moderate pulmonary hypertension. Estimated systolic pulmonary pressure was 53 mmHg.  She has been doing reasonably well and denies chest pain at the present time. She does complain of intermittent shortness of breath most noticeable when she goes upstairs. She has no orthopnea or leg edema. She was started recently on small dose hydrochlorothiazide for hypertension. She is very hesitant about increasing the dose. She reports that blood pressure is always higher at the physician's office. She also has significant hyperlipidemia and has been very resistant to treatment with a statin.   Past Medical History:  Diagnosis Date  . Arthritis    knees, Hands  . GERD (gastroesophageal reflux disease)     Past Surgical History:  Procedure Laterality Date  . CATARACT EXTRACTION W/PHACO Right 01/25/2016   Procedure: CATARACT EXTRACTION PHACO AND  INTRAOCULAR LENS PLACEMENT (IOC);  Surgeon: Ronnell Freshwater, MD;  Location: [place];  Service: Ophthalmology;  Laterality: Right;  RIGHT  . CATARACT EXTRACTION W/PHACO Left 02/22/2016   Procedure: CATARACT EXTRACTION PHACO AND INTRAOCULAR LENS PLACEMENT (IOC);  Surgeon: Ronnell Freshwater, MD;  Location: [place];  Service: Ophthalmology;  Laterality: Left;  LEFT  . [place]  2010  . KNEE ARTHROSCOPY Right 2004  . REPLACEMENT TOTAL KNEE Right 2009   [facility]  . SKIN GRAFT Left 04/08/2013   Done on left index finger  . TONSILLECTOMY  1946     Current Outpatient Prescriptions  Medication Sig Dispense Refill  . hydrochlorothiazide (MICROZIDE) 12.5 MG capsule Take 1 capsule (12.5 mg total) by mouth daily. 90 capsule 1  . Multiple Vitamin (MULTIVITAMIN) capsule Take 1 capsule by mouth daily.    [name] Kitchen sulfamethoxazole-trimethoprim (BACTRIM DS,SEPTRA DS) 800-160 MG tablet Take 1 tablet by mouth 2 (two) times daily. 10 tablet 0  . VENTOLIN HFA 108 (90 Base) MCG/ACT inhaler Inhale 1-2 puffs into the lungs every 6 (six) hours as needed for shortness of breath. 1 Inhaler 3   No current facility-administered medications for this visit.     Allergies:   Levofloxacin; Influenza vaccines; and Oysters [shellfish allergy]    Social History:  The patient  reports that she quit smoking about 27 years ago. Her smoking use included Cigarettes. She has a 30.00 pack-year smoking history. She has never used smokeless tobacco. She reports that she drinks about 8.4  oz of alcohol per week . She reports that she does not use drugs.   Family History:  [name] family history includes Atrial fibrillation in her sister and sister; Breast cancer (age of onset: 3) in her sister; Healthy in her brother; Heart attack in her father; Hyperlipidemia in her sister and sister; Transient ischemic attack in her mother.    ROS:  Please see the history of  present illness.   Otherwise, review of systems are positive for none.   All other systems are reviewed and negative.    PHYSICAL EXAM: VS:  BP (!) 180/76 (BP Location: Left Arm, Patient Position: Sitting, Cuff Size: Normal)   Pulse 82   Ht 5\' 8"  (1.727 m)   Wt 211 lb 4 oz (95.8 kg)   BMI 32.12 kg/m  , BMI Body mass index is 32.12 kg/m. GEN: Well nourished, well developed, in no acute distress  HEENT: normal  Neck: no JVD, carotid bruits, or masses Cardiac: RRR; no  rubs, or gallops,no edema . One out of 6 systolic ejection murmur in the aortic area Respiratory:  clear to auscultation bilaterally, normal work of breathing GI: soft, nontender, nondistended, + BS MS: no deformity or atrophy  Skin: warm and dry, no rash Neuro:  Strength and sensation are intact Psych: euthymic mood, full affect   EKG:  EKG is ordered today. The ekg ordered today demonstrates normal sinus rhythm with nonspecific ST changes.   Recent Labs: 12/28/2016: ALT 11; Hemoglobin 14.1; Platelets 276; TSH 1.330 01/19/2017: BUN 20; Creat 0.78; Potassium 3.7; Sodium 143    Lipid Panel    Component Value Date/Time   CHOL 269 (H) 12/28/2016 0823   TRIG 163 (H) 12/28/2016 0823   HDL 67 12/28/2016 0823   CHOLHDL 3.5 10/29/2015 0829   LDLCALC 169 (H) 12/28/2016 0823      Wt Readings from Last 3 Encounters:  02/14/17 211 lb 4 oz (95.8 kg)  02/13/17 210 lb (95.3 kg)  01/19/17 211 lb 9.6 oz (96 kg)      No flowsheet data found.    ASSESSMENT AND PLAN:  1.  Exertional dyspnea with known history of moderate pulmonary hypertension: Symptoms are relatively stable. I'm going to obtain a follow-up echocardiogram.  2. Essential hypertension: [name] blood pressure was significantly elevated. She reports a component of white coat syndrome. She is very hesitant about increasing the dose of hydrochlorothiazide or adding another antihypertensive medications.  3. Coronary atherosclerosis: This was noted on  previous CT scan of the lungs. Recommend treatment of risk factors. She has no angina at the present time. Stress test in 2016 was normal.  4. Hyperlipidemia: Most recent LDL was 169. Given the presence of coronary atherosclerosis, I discussed with her the importance of lowering her LDL. She is agreeable to try a small dose rosuvastatin 5 mg once daily. Repeat lipid and liver profile in 6 weeks.    Disposition:   FU with me in 6 months  Signed,  Kathlyn Sacramento, MD  02/14/2017 3:33 PM    [place]

## 2017-02-14 NOTE — Patient Instructions (Addendum)
Medication Instructions:  Your physician has recommended you make the following change in your medication:  START taking rosuvastatin 5mg  once daily    Labwork: Fasting lipid and liver profile in 6 weeks at the [facility]. No appt necessary. Nothing to eat or drink after midnight the evening before your labs.   Testing/Procedures: Your physician has requested that you have an echocardiogram. Echocardiography is a painless test that uses sound waves to create images of your heart. It provides your doctor with information about the size and shape of your heart and how well your heart's chambers and valves are working. This procedure takes approximately one hour. There are no restrictions for this procedure.    Follow-Up: Your physician wants you to follow-up in: 6 months with Dr. Fletcher Anon.  You will receive a reminder letter in the mail two months in advance. If you don't receive a letter, please call our office to schedule the follow-up appointment.    Any Other Special Instructions Will Be Listed Below (If Applicable).     If you need a refill on your cardiac medications before your next appointment, please call your pharmacy.  Echocardiogram An echocardiogram, or echocardiography, uses sound waves (ultrasound) to produce an image of your heart. The echocardiogram is simple, painless, obtained within a short period of time, and offers valuable information to your health care provider. The images from an echocardiogram can provide information such as:  Evidence of coronary artery disease (CAD).  Heart size.  Heart muscle function.  Heart valve function.  Aneurysm detection.  Evidence of a past heart attack.  Fluid buildup around the heart.  Heart muscle thickening.  Assess heart valve function.  Tell a health care provider about:  Any allergies you have.  All medicines you are taking, including vitamins, herbs, eye drops, creams, and over-the-counter medicines.  Any  problems you or family members have had with anesthetic medicines.  Any blood disorders you have.  Any surgeries you have had.  Any medical conditions you have.  Whether you are pregnant or may be pregnant. What happens before the procedure? No special preparation is needed. Eat and drink normally. What happens during the procedure?  In order to produce an image of your heart, gel will be applied to your chest and a wand-like tool (transducer) will be moved over your chest. The gel will help transmit the sound waves from the transducer. The sound waves will harmlessly bounce off your heart to allow the heart images to be captured in real-time motion. These images will then be recorded.  You may need an IV to receive a medicine that improves the quality of the pictures. What happens after the procedure? You may return to your normal schedule including diet, activities, and medicines, unless your health care provider tells you otherwise. This information is not intended to replace advice given to you by your health care provider. Make sure you discuss any questions you have with your health care provider. Document Released: 04/29/2000 Document Revised: 12/19/2015 Document Reviewed: 01/07/2013 Elsevier Interactive Patient Education  2017 [employer].

## 2017-02-16 ENCOUNTER — Telehealth: Payer: Self-pay

## 2017-02-16 LAB — URINE CULTURE
MICRO NUMBER: 81086722
SPECIMEN QUALITY:: ADEQUATE

## 2017-02-16 NOTE — Telephone Encounter (Signed)
-----   Message from [name], MD sent at 02/16/2017  8:55 AM EDT ----- Urine culture shows E coli UTI that is sensitive to medication prescribed.  [name], MD, MPH [facility] 02/16/2017 8:55 AM

## 2017-02-16 NOTE — Telephone Encounter (Signed)
Pt advised.

## 2017-02-23 ENCOUNTER — Other Ambulatory Visit: Payer: Self-pay

## 2017-03-07 ENCOUNTER — Other Ambulatory Visit: Payer: Self-pay

## 2017-03-07 ENCOUNTER — Ambulatory Visit (INDEPENDENT_AMBULATORY_CARE_PROVIDER_SITE_OTHER): Payer: Medicare HMO

## 2017-03-07 DIAGNOSIS — R0602 Shortness of breath: Secondary | ICD-10-CM

## 2017-03-29 ENCOUNTER — Telehealth: Payer: Self-pay | Admitting: Cardiovascular Disease

## 2017-03-29 NOTE — Telephone Encounter (Signed)
Patient missed labs being drawn b/c she was sick  and wants Dr. Fletcher Anon to know she is going next week

## 2017-04-03 ENCOUNTER — Encounter: Payer: Self-pay | Admitting: Family Medicine

## 2017-04-03 ENCOUNTER — Ambulatory Visit: Payer: Medicare HMO | Admitting: Family Medicine

## 2017-04-03 VITALS — BP 128/68 | HR 72 | Temp 98.1°F | Resp 16 | Ht 68.0 in | Wt 214.0 lb

## 2017-04-03 DIAGNOSIS — G8929 Other chronic pain: Secondary | ICD-10-CM | POA: Diagnosis not present

## 2017-04-03 DIAGNOSIS — M7741 Metatarsalgia, right foot: Secondary | ICD-10-CM | POA: Diagnosis not present

## 2017-04-03 DIAGNOSIS — R252 Cramp and spasm: Secondary | ICD-10-CM | POA: Diagnosis not present

## 2017-04-03 DIAGNOSIS — R319 Hematuria, unspecified: Secondary | ICD-10-CM | POA: Diagnosis not present

## 2017-04-03 DIAGNOSIS — S68119A Complete traumatic metacarpophalangeal amputation of unspecified finger, initial encounter: Secondary | ICD-10-CM | POA: Insufficient documentation

## 2017-04-03 DIAGNOSIS — E7800 Pure hypercholesterolemia, unspecified: Secondary | ICD-10-CM | POA: Diagnosis not present

## 2017-04-03 DIAGNOSIS — I10 Essential (primary) hypertension: Secondary | ICD-10-CM

## 2017-04-03 DIAGNOSIS — M25511 Pain in right shoulder: Secondary | ICD-10-CM | POA: Diagnosis not present

## 2017-04-03 DIAGNOSIS — M7742 Metatarsalgia, left foot: Secondary | ICD-10-CM | POA: Diagnosis not present

## 2017-04-03 NOTE — Patient Instructions (Signed)
Go to [employer] to buy some orthotics for your shoes with metatarsal pads and arch support  Foot Pain Many things can cause foot pain. Some common causes are:  An injury.  A sprain.  Arthritis.  Blisters.  Bunions.  Follow these instructions at home: Pay attention to any changes in your symptoms. Take these actions to help with your discomfort:  If directed, put ice on the affected area: ? Put ice in a plastic bag. ? Place a towel between your skin and the bag. ? Leave the ice on for 15-20 minutes, 3?4 times a day for 2 days.  Take over-the-counter and prescription medicines only as told by your health care provider.  Wear comfortable, supportive shoes that fit you well. Do not wear high heels.  Do not stand or walk for long periods of time.  Do not lift a lot of weight. This can put added pressure on your feet.  Do stretches to relieve foot pain and stiffness as told by your health care provider.  Rub your foot gently.  Keep your feet clean and dry.  Contact a health care provider if:  Your pain does not get better after a few days of self-care.  Your pain gets worse.  You cannot stand on your foot. Get help right away if:  Your foot is numb or tingling.  Your foot or toes are swollen.  Your foot or toes turn white or blue.  You have warmth and redness along your foot. This information is not intended to replace advice given to you by your health care provider. Make sure you discuss any questions you have with your health care provider. Document Released: 05/29/2015 Document Revised: 10/08/2015 Document Reviewed: 05/28/2014 Elsevier Interactive Patient Education  [employer].

## 2017-04-03 NOTE — Progress Notes (Signed)
Patient: Lauren Lloyd Female    DOB: Jun 03, 1940   76 y.o.   MRN: [id number] Visit Date: 04/04/2017  Today's Provider: Lavon Paganini, MD   Chief Complaint  Patient presents with  . Follow-up   Subjective:    HPI  Follow up for hematuria  The patient was last seen for this 6 weeks ago. Changes made at last visit include start antibiotic.  She reports excellent compliance with treatment. She feels that condition is Improved. Patient denies any blood in her urine. Patient reports urine does have a foul odor.  She is not having side effects.  ------------------------------------------------------------------------------------   Hypertension, follow-up:  BP Readings from Last 3 Encounters:  04/03/17 128/68  02/14/17 (!) 180/76  02/13/17 (!) 154/76    She was last seen for hypertension 2 months ago.  BP at that visit was 130/66. Management changes since that visit include no changes. She reports excellent compliance with treatment. She is not having side effects. Patient reports muscle cramps all over body.  She thinks she is not drinking enough water.  She has not started statin as recommended by Cardiology. She is exercising. She is not adherent to low salt diet.   Outside blood pressures are not being checked. She is experiencing none.  Patient denies chest pain and lower extremity edema.   Cardiovascular risk factors include advanced age (older than 80 for men, 65 for women), hypertension, obesity (BMI >= 30 kg/m2) and smoking/ tobacco exposure.  Use of agents associated with hypertension: NSAIDS.     Weight trend: stable Wt Readings from Last 3 Encounters:  04/03/17 214 lb (97.1 kg)  02/14/17 211 lb 4 oz (95.8 kg)  02/13/17 210 lb (95.3 kg)    Current diet: in general, a "healthy" diet    ------------------------------------------------------------------------  Patient C/O right shoulder pain x's several months. Patient reports she feels a "lump"  on her right arm that is new in last few months and reports pain with activity. Discussed at last visit that I suspected tendonopathy.  Patient reports she does not take any medication for pain daily. Patient reports she will take aleve as needed and reports good pain control.  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   Patient C/O b/l foot pain soreness "doesn't want to work." Patient reports she feels increased pain/soreness after sitting down for a while. Pain is on bottom of ball of b/l feet.     Allergies  Allergen Reactions  . Levofloxacin     Other reaction(s): Joint Pains Other reaction(s): Other (See Comments) Joint pain  . Influenza Vaccines Other (See Comments)    Bell's Palsy  . Oysters [Shellfish Allergy] Swelling    She states she had eaten them three days in a row and she developed swelling around her eyes.      Current Outpatient Medications:  .  hydrochlorothiazide (MICROZIDE) 12.5 MG capsule, Take 1 capsule (12.5 mg total) by mouth daily., Disp: 90 capsule, Rfl: 1 .  Multiple Vitamin (MULTIVITAMIN) capsule, Take 1 capsule by mouth daily., Disp: , Rfl:  .  VENTOLIN HFA 108 (90 Base) MCG/ACT inhaler, Inhale 1-2 puffs into the lungs every 6 (six) hours as needed for shortness of breath., Disp: 1 Inhaler, Rfl: 3  Review of Systems  Constitutional: Negative.   HENT: Negative.   Respiratory: Negative.   Cardiovascular: Negative.   Genitourinary: Negative.   Musculoskeletal: Positive for myalgias.  Neurological: Negative.     Social History  Tobacco Use  . Smoking status: Former Smoker    Packs/day: 1.00    Years: 30.00    Pack years: 30.00    Types: Cigarettes    Last attempt to quit: 05/16/1989    Years since quitting: 27.9  . Smokeless tobacco: Never Used  Substance Use Topics  . Alcohol use: Yes    Alcohol/week: 8.4 oz    Types: 7 Glasses of wine, 7 Standard drinks or equivalent per week   Objective:   BP 128/68 (BP Location: Left Arm,  Patient Position: Sitting, Cuff Size: Large)   Pulse 72   Temp 98.1 F (36.7 C) (Oral)   Resp 16   Ht 5\' 8"  (1.727 m)   Wt 214 lb (97.1 kg)   BMI 32.54 kg/m  Vitals:   04/03/17 1623  BP: 128/68  Pulse: 72  Resp: 16  Temp: 98.1 F (36.7 C)  TempSrc: Oral  Weight: 214 lb (97.1 kg)  Height: 5\' 8"  (1.727 m)     Physical Exam  Constitutional: She is oriented to person, place, and time. She appears well-developed and well-nourished. No distress.  HENT:  Head: Normocephalic and atraumatic.  Eyes: Conjunctivae are normal. No scleral icterus.  Cardiovascular: Normal rate, regular rhythm, normal heart sounds and intact distal pulses.  No murmur heard. Pulmonary/Chest: Effort normal and breath sounds normal. No respiratory distress. She has no wheezes. She has no rales.  Abdominal: Soft. Bowel sounds are normal. She exhibits no distension. There is no tenderness. There is no rebound and no guarding.  Musculoskeletal: She exhibits no edema or deformity.  B/l feet: No heel tenderness or tenderness along plantar fascia. Longitudinal arch somewhat flat. Loss of transverse arch. TTP along toes 2-3 metatarsal heads. No erythema, swelling, bruising.  R Shoulder: Inspection reveals no abnormalities, atrophy or asymmetry. Palpation is normal with no tenderness over AC joint or bicipital groove. ROM is full in all planes. Rotator cuff strength normal throughout. +Hawkins test  Neurological: She is alert and oriented to person, place, and time.  Skin: Skin is warm and dry. No rash noted.  Psychiatric: She has a normal mood and affect. Her behavior is normal.  Vitals reviewed.       Assessment & Plan:      Problem List Items Addressed This Visit      Cardiovascular and Mediastinum   Hypertension - Primary    Well controlled Continue HCTZ 12.5 mg daily Discussed changing to different medication due to leg cramps, but patient wishes to try increasing fluid intake first Recent CMP wnl  without electrolyte abnormality F/u in 3 months        Other   Hypercholesteremia    Was started on Crestor by cardiology She has not started, because she wants to get side effects of antihypertensives under control first Will reinforce importance at next visit      Chronic right shoulder pain    No evidence of rotator cuff pathology Does have some impingement signs Likely tendonopathy Continue conservative management with gentle exercises, NSAIDs Referral to sports medicine for possible [name] vs other imaging and further management Could consider subacromial injection in the future      Relevant Orders   Ambulatory referral to Sports Medicine   Hematuria    Resolved on today's UA Was related to UTI and cleared with abx      Leg cramps    Likely related to HCTZ Discussed trial of a different antihypertensive, but patient first wants to increase fluid intake  and see if this helps F/u in 3 months, can recheck CMP and consider antihypertensive change if continues      Metatarsalgia of both feet    Likely metatarsal pain related to loss of transverse arch Discussed buying orthotics with arch support and metatarsal pads Can further discuss with sports medicine if not improved         Return in about 3 months (around 07/04/2017) for BP f/u.      [name], [name]. Portions of this information were initially documented by Lynford Humphrey, CMA and reviewed by me for thoroughness and accuracy.     Lavon Paganini, MD  [facility]

## 2017-04-04 DIAGNOSIS — M7741 Metatarsalgia, right foot: Secondary | ICD-10-CM | POA: Insufficient documentation

## 2017-04-04 DIAGNOSIS — R319 Hematuria, unspecified: Secondary | ICD-10-CM | POA: Insufficient documentation

## 2017-04-04 DIAGNOSIS — M7742 Metatarsalgia, left foot: Secondary | ICD-10-CM

## 2017-04-04 DIAGNOSIS — R252 Cramp and spasm: Secondary | ICD-10-CM | POA: Insufficient documentation

## 2017-04-04 NOTE — Assessment & Plan Note (Signed)
No evidence of rotator cuff pathology Does have some impingement signs Likely tendonopathy Continue conservative management with gentle exercises, NSAIDs Referral to sports medicine for possible [name] vs other imaging and further management Could consider subacromial injection in the future

## 2017-04-04 NOTE — Assessment & Plan Note (Signed)
Likely related to HCTZ Discussed trial of a different antihypertensive, but patient first wants to increase fluid intake and see if this helps F/u in 3 months, can recheck CMP and consider antihypertensive change if continues

## 2017-04-04 NOTE — Assessment & Plan Note (Signed)
Likely metatarsal pain related to loss of transverse arch Discussed buying orthotics with arch support and metatarsal pads Can further discuss with sports medicine if not improved

## 2017-04-04 NOTE — Assessment & Plan Note (Signed)
Resolved on today's UA Was related to UTI and cleared with abx

## 2017-04-04 NOTE — Assessment & Plan Note (Signed)
Well controlled Continue HCTZ 12.5 mg daily Discussed changing to different medication due to leg cramps, but patient wishes to try increasing fluid intake first Recent CMP wnl without electrolyte abnormality F/u in 3 months

## 2017-04-04 NOTE — Assessment & Plan Note (Signed)
Was started on Crestor by cardiology She has not started, because she wants to get side effects of antihypertensives under control first Will reinforce importance at next visit

## 2017-04-12 DIAGNOSIS — M25511 Pain in right shoulder: Secondary | ICD-10-CM | POA: Diagnosis not present

## 2017-04-12 DIAGNOSIS — R2231 Localized swelling, mass and lump, right upper limb: Secondary | ICD-10-CM | POA: Diagnosis not present

## 2017-04-12 DIAGNOSIS — M25519 Pain in unspecified shoulder: Secondary | ICD-10-CM | POA: Diagnosis not present

## 2017-04-20 ENCOUNTER — Ambulatory Visit: Payer: Self-pay | Admitting: Family Medicine

## 2017-05-22 ENCOUNTER — Other Ambulatory Visit: Payer: Self-pay | Admitting: Orthopedic Surgery

## 2017-05-22 DIAGNOSIS — M25819 Other specified joint disorders, unspecified shoulder: Secondary | ICD-10-CM

## 2017-05-26 ENCOUNTER — Ambulatory Visit: Payer: Medicare HMO

## 2017-05-29 ENCOUNTER — Ambulatory Visit
Admission: RE | Admit: 2017-05-29 | Discharge: 2017-05-29 | Disposition: A | Discharge status: Home / Self Care | Payer: Medicare HMO | Source: Ambulatory Visit | Attending: Orthopedic Surgery | Admitting: Orthopedic Surgery

## 2017-05-29 DIAGNOSIS — M75101 Unspecified rotator cuff tear or rupture of right shoulder, not specified as traumatic: Secondary | ICD-10-CM | POA: Diagnosis not present

## 2017-05-29 DIAGNOSIS — M75111 Incomplete rotator cuff tear or rupture of right shoulder, not specified as traumatic: Secondary | ICD-10-CM | POA: Diagnosis not present

## 2017-05-29 DIAGNOSIS — M25519 Pain in unspecified shoulder: Secondary | ICD-10-CM | POA: Diagnosis present

## 2017-05-29 DIAGNOSIS — M25819 Other specified joint disorders, unspecified shoulder: Secondary | ICD-10-CM

## 2017-05-29 LAB — POCT I-STAT CREATININE: Creatinine, Ser: 0.8 mg/dL (ref 0.44–1.00)

## 2017-05-29 MED ORDER — GADOBENATE DIMEGLUMINE 529 MG/ML IV SOLN
20.0000 mL | Freq: Once | INTRAVENOUS | Status: AC | PRN
  Administered 2017-05-29: 20 mL via INTRAVENOUS

## 2017-06-15 ENCOUNTER — Ambulatory Visit
Admission: RE | Admit: 2017-06-15 | Discharge: 2017-06-15 | Disposition: A | Discharge status: Home / Self Care | Payer: Medicare HMO | Source: Ambulatory Visit | Attending: Family Medicine | Admitting: Family Medicine

## 2017-06-15 ENCOUNTER — Telehealth: Payer: Self-pay

## 2017-06-15 ENCOUNTER — Ambulatory Visit (INDEPENDENT_AMBULATORY_CARE_PROVIDER_SITE_OTHER): Payer: Medicare HMO | Admitting: Family Medicine

## 2017-06-15 ENCOUNTER — Encounter: Payer: Self-pay | Admitting: Family Medicine

## 2017-06-15 ENCOUNTER — Other Ambulatory Visit: Payer: Self-pay | Admitting: Family Medicine

## 2017-06-15 VITALS — BP 180/84 | HR 71 | Temp 98.0°F | Resp 16 | Wt 215.0 lb

## 2017-06-15 DIAGNOSIS — I517 Cardiomegaly: Secondary | ICD-10-CM | POA: Insufficient documentation

## 2017-06-15 DIAGNOSIS — J40 Bronchitis, not specified as acute or chronic: Secondary | ICD-10-CM

## 2017-06-15 DIAGNOSIS — R05 Cough: Secondary | ICD-10-CM

## 2017-06-15 DIAGNOSIS — R059 Cough, unspecified: Secondary | ICD-10-CM

## 2017-06-15 DIAGNOSIS — R0602 Shortness of breath: Secondary | ICD-10-CM | POA: Insufficient documentation

## 2017-06-15 MED ORDER — PREDNISONE 50 MG PO TABS: 50.0000 mg | ORAL_TABLET | Freq: Every day | ORAL | 0 refills | Status: AC

## 2017-06-15 MED ORDER — HYDROCOD POLST-CPM POLST ER 10-8 MG/5ML PO SUER: 5.0000 mL | Freq: Two times a day (BID) | ORAL | 0 refills | Status: DC | PRN

## 2017-06-15 NOTE — Patient Instructions (Signed)
Cough, Adult  Coughing is a reflex that clears your throat and your airways. Coughing helps to heal and protect your lungs. It is normal to cough occasionally, but a cough that happens with other symptoms or lasts a long time may be a sign of a condition that needs treatment. A cough may last only 2-3 weeks (acute), or it may last longer than 8 weeks (chronic).  What are the causes?  Coughing is commonly caused by:   Breathing in substances that irritate your lungs.   A viral or bacterial respiratory infection.   Allergies.   Asthma.   Postnasal drip.   Smoking.   Acid backing up from the stomach into the esophagus (gastroesophageal reflux).   Certain medicines.   Chronic lung problems, including COPD (or rarely, lung cancer).   Other medical conditions such as heart failure.    Follow these instructions at home:  Pay attention to any changes in your symptoms. Take these actions to help with your discomfort:   Take medicines only as told by your health care provider.  ? If you were prescribed an antibiotic medicine, take it as told by your health care provider. Do not stop taking the antibiotic even if you start to feel better.  ? Talk with your health care provider before you take a cough suppressant medicine.   Drink enough fluid to keep your urine clear or pale yellow.   If the air is dry, use a cold steam vaporizer or humidifier in your bedroom or your home to help loosen secretions.   Avoid anything that causes you to cough at work or at home.   If your cough is worse at night, try sleeping in a semi-upright position.   Avoid cigarette smoke. If you smoke, quit smoking. If you need help quitting, ask your health care provider.   Avoid caffeine.   Avoid alcohol.   Rest as needed.    Contact a health care provider if:   You have new symptoms.   You cough up pus.   Your cough does not get better after 2-3 weeks, or your cough gets worse.   You cannot control your cough with suppressant  medicines and you are losing sleep.   You develop pain that is getting worse or pain that is not controlled with pain medicines.   You have a fever.   You have unexplained weight loss.   You have night sweats.  Get help right away if:   You cough up blood.   You have difficulty breathing.   Your heartbeat is very fast.  This information is not intended to replace advice given to you by your health care provider. Make sure you discuss any questions you have with your health care provider.  Document Released: 10/29/2010 Document Revised: 10/08/2015 Document Reviewed: 07/09/2014  Elsevier Interactive Patient Education  2018 [employer]

## 2017-06-15 NOTE — Telephone Encounter (Signed)
Left message advising pt. OK per DPR. 

## 2017-06-15 NOTE — Telephone Encounter (Signed)
-----   Message from [name], MD sent at 06/15/2017  3:09 PM EST ----- Appears to be bronchitis.  No pneumonia.  Will send Rx for prednisone to pharmacy.  No antibiotics needed at this time.  This can take weeks to fully get better.   [name], MD, MPH [facility] 06/15/2017 3:07 PM

## 2017-06-15 NOTE — Progress Notes (Signed)
Patient: Lauren Lloyd Female    DOB: 1941/03/20   77 y.o.   MRN: [id number] Visit Date: 06/16/2017  Today's Provider: Lavon Paganini, MD   I, [name], CMA, am acting as scribe for Lavon Paganini, MD.  Chief Complaint  Patient presents with  . URI   Subjective:    URI   This is a new problem. Episode onset: x 6 days. The problem has been gradually worsening (has moved into chest). There has been no fever. Associated symptoms include chest pain (tightness), congestion, coughing (mostly dry), headaches, a plugged ear sensation (left), rhinorrhea, sneezing (at onset), a sore throat (at onset) and wheezing. Pertinent negatives include no abdominal pain, diarrhea, dysuria, ear pain, nausea, neck pain, sinus pain, swollen glands or vomiting. Treatments tried: Tussionex, with relief. Pt states she only has one spoonful left of this medication.   Worse cough, tightness, wheezing for last 2-3 days. Mucinex stopped up sinuses in the past - did not take. Has needed prednisone in the past for similar illness    Allergies  Allergen Reactions  . Levofloxacin     Other reaction(s): Joint Pains Other reaction(s): Other (See Comments) Joint pain  . Influenza Vaccines Other (See Comments)    Bell's Palsy  . Oysters [Shellfish Allergy] Swelling    She states she had eaten them three days in a row and she developed swelling around her eyes.      Current Outpatient Medications:  .  hydrochlorothiazide (MICROZIDE) 12.5 MG capsule, Take 1 capsule (12.5 mg total) by mouth daily., Disp: 90 capsule, Rfl: 1 .  Multiple Vitamin (MULTIVITAMIN) capsule, Take 1 capsule by mouth daily., Disp: , Rfl:  .  chlorpheniramine-HYDROcodone (TUSSIONEX PENNKINETIC ER) 10-8 MG/5ML SUER, Take 5 mLs by mouth every 12 (twelve) hours as needed for cough., Disp: 140 mL, Rfl: 0 .  predniSONE (DELTASONE) 50 MG tablet, Take 1 tablet (50 mg total) by mouth daily with breakfast for 5 days., Disp: 5 tablet, Rfl:  0 .  VENTOLIN HFA 108 (90 Base) MCG/ACT inhaler, Inhale 1-2 puffs into the lungs every 6 (six) hours as needed for shortness of breath. (Patient not taking: Reported on 06/15/2017), Disp: 1 Inhaler, Rfl: 3  Review of Systems  HENT: Positive for congestion, rhinorrhea, sneezing (at onset) and sore throat (at onset). Negative for ear pain and sinus pain.   Respiratory: Positive for cough (mostly dry) and wheezing.   Cardiovascular: Positive for chest pain (tightness).  Gastrointestinal: Negative for abdominal pain, diarrhea, nausea and vomiting.  Genitourinary: Negative for dysuria.  Musculoskeletal: Negative for neck pain.  Neurological: Positive for headaches.    Social History   Tobacco Use  . Smoking status: Former Smoker    Packs/day: 1.00    Years: 30.00    Pack years: 30.00    Types: Cigarettes    Last attempt to quit: 05/16/1989    Years since quitting: 28.1  . Smokeless tobacco: Never Used  Substance Use Topics  . Alcohol use: Yes    Alcohol/week: 8.4 oz    Types: 7 Glasses of wine, 7 Standard drinks or equivalent per week   Objective:   BP (!) 180/84 (BP Location: Left Arm, Patient Position: Sitting, Cuff Size: Large) Comment: pt has not taken her BP medication today  Pulse 71   Temp 98 F (36.7 C) (Oral)   Resp 16   Wt 215 lb (97.5 kg)   SpO2 97%   BMI 32.69 kg/m  Vitals:   06/15/17  0959  BP: (!) 180/84  Pulse: 71  Resp: 16  Temp: 98 F (36.7 C)  TempSrc: Oral  SpO2: 97%  Weight: 215 lb (97.5 kg)     Physical Exam  Constitutional: She is oriented to person, place, and time. She appears well-developed and well-nourished. No distress.  HENT:  Head: Normocephalic and atraumatic.  Right Ear: External ear normal.  Left Ear: External ear normal.  Nose: Nose normal.  Mouth/Throat: Oropharynx is clear and moist. No oropharyngeal exudate.  Eyes: Conjunctivae and EOM are normal. Pupils are equal, round, and reactive to light. No scleral icterus.  Neck: Neck  supple. No thyromegaly present.  Cardiovascular: Normal rate, regular rhythm, normal heart sounds and intact distal pulses.  No murmur heard. Pulmonary/Chest: Effort normal. No respiratory distress. She has no wheezes.  Diffuse rhonchorus breath sounds worse in bases  Musculoskeletal: She exhibits no edema.  Lymphadenopathy:    She has no cervical adenopathy.  Neurological: She is alert and oriented to person, place, and time.  Psychiatric: She has a normal mood and affect. Her behavior is normal.  Vitals reviewed.   Dg Chest 2 View  Result Date: 06/15/2017 CLINICAL DATA:  Cough and shortness of breath for 3 days, former smoker EXAM: CHEST  2 VIEW COMPARISON:  11/14/2016 FINDINGS: Minimal enlargement of cardiac silhouette. Atherosclerotic calcification at aortic arch. Mediastinal contours and pulmonary vascularity normal. Chronic peribronchial thickening without infiltrate, pleural effusion, or pneumothorax. Bones demineralized. IMPRESSION: Minimal enlargement of cardiac silhouette bronchitic changes without acute abnormalities. Electronically Signed   By: Lavonia Dana M.D.   On: 06/15/2017 14:33        Assessment & Plan:   1. Bronchitis - no evidence of CAP on CXR - most consistent with bronchitis - no abx indicated - will give 5d burst of prednisone - also treat symptomatically and discussed return precautions - DG Chest 2 View; Future    Meds ordered this encounter  Medications  . chlorpheniramine-HYDROcodone (TUSSIONEX PENNKINETIC ER) 10-8 MG/5ML SUER    Sig: Take 5 mLs by mouth every 12 (twelve) hours as needed for cough.    Dispense:  140 mL    Refill:  0  . predniSONE (DELTASONE) 50 MG tablet    Sig: Take 1 tablet (50 mg total) by mouth daily with breakfast for 5 days.    Dispense:  5 tablet    Refill:  0     Return if symptoms worsen or fail to improve.   [name], Review of Systems and Physical  Exam were personally obtained by me. Portions of this information were initially documented by [name], CMA and reviewed by me for thoroughness and accuracy.    [name], MD, MPH [facility] 06/16/2017 10:20 AM

## 2017-06-26 DIAGNOSIS — S46011A Strain of muscle(s) and tendon(s) of the rotator cuff of right shoulder, initial encounter: Secondary | ICD-10-CM | POA: Diagnosis not present

## 2017-07-10 ENCOUNTER — Ambulatory Visit: Payer: Self-pay | Admitting: Family Medicine

## 2017-07-31 ENCOUNTER — Ambulatory Visit: Payer: Medicare HMO | Admitting: Podiatry

## 2017-07-31 ENCOUNTER — Encounter: Payer: Self-pay | Admitting: Podiatry

## 2017-07-31 ENCOUNTER — Ambulatory Visit (INDEPENDENT_AMBULATORY_CARE_PROVIDER_SITE_OTHER): Payer: Medicare HMO

## 2017-07-31 VITALS — BP 156/80 | HR 68 | Resp 18

## 2017-07-31 DIAGNOSIS — L84 Corns and callosities: Secondary | ICD-10-CM

## 2017-07-31 DIAGNOSIS — M2042 Other hammer toe(s) (acquired), left foot: Secondary | ICD-10-CM | POA: Diagnosis not present

## 2017-07-31 NOTE — Progress Notes (Signed)
Subjective:    Patient ID: Lauren Lloyd, female    DOB: 22-Sep-1940, 77 y.o.   MRN: [id number]  HPI Lauren Lloyd presents the office today for concerns of a corn between her big toe and her second toe on both feet.  She states the left side is worse than the right.  She has an upcoming hiking trip and she wants to have this area checked that way she can get a height.  She denies any redness or drainage or any swelling.  She does use a callus pad on the area at times.  She denies any swelling.  She has no other concerns otherwise.   Review of Systems  All other systems reviewed and are negative.  Past Medical History:  Diagnosis Date  . Arthritis    knees, Hands  . GERD (gastroesophageal reflux disease)     Past Surgical History:  Procedure Laterality Date  . CATARACT EXTRACTION W/PHACO Right 01/25/2016   Procedure: CATARACT EXTRACTION PHACO AND INTRAOCULAR LENS PLACEMENT (IOC);  Surgeon: Ronnell Freshwater, MD;  Location: [place];  Service: Ophthalmology;  Laterality: Right;  RIGHT  . CATARACT EXTRACTION W/PHACO Left 02/22/2016   Procedure: CATARACT EXTRACTION PHACO AND INTRAOCULAR LENS PLACEMENT (IOC);  Surgeon: Ronnell Freshwater, MD;  Location: [place];  Service: Ophthalmology;  Laterality: Left;  LEFT  . [place]  2010  . KNEE ARTHROSCOPY Right 2004  . REPLACEMENT TOTAL KNEE Right 2009   [facility]  . SKIN GRAFT Left 04/08/2013   Done on left index finger  . TONSILLECTOMY  1946     Current Outpatient Medications:  .  chlorpheniramine-HYDROcodone (TUSSIONEX PENNKINETIC ER) 10-8 MG/5ML SUER, Take 5 mLs by mouth every 12 (twelve) hours as needed for cough., Disp: 140 mL, Rfl: 0 .  hydrochlorothiazide (MICROZIDE) 12.5 MG capsule, Take 1 capsule (12.5 mg total) by mouth daily., Disp: 90 capsule, Rfl: 1 .  Multiple Vitamin (MULTIVITAMIN) capsule, Take 1 capsule by mouth daily., Disp: , Rfl:  .  VENTOLIN HFA 108 (90 Base)  MCG/ACT inhaler, Inhale 1-2 puffs into the lungs every 6 (six) hours as needed for shortness of breath. (Patient not taking: Reported on 06/15/2017), Disp: 1 Inhaler, Rfl: 3  Allergies  Allergen Reactions  . Levofloxacin     Other reaction(s): Joint Pains Other reaction(s): Other (See Comments) Joint pain  . Influenza Vaccines Other (See Comments)    Bell's Palsy  . Oysters [Shellfish Allergy] Swelling    She states she had eaten them three days in a row and she developed swelling around her eyes.     Social History   Socioeconomic History  . Marital status: Divorced    Spouse name: Not on file  . Number of children: 5  . Years of education: college  . Highest education level: Not on file  Social Needs  . Financial resource strain: Not on file  . Food insecurity - worry: Not on file  . Food insecurity - inability: Not on file  . Transportation needs - medical: Not on file  . Transportation needs - non-medical: Not on file  Occupational History  . Occupation: Part Time    Employer: [employer]  Tobacco Use  . Smoking status: Former Smoker    Packs/day: 1.00    Years: 30.00    Pack years: 30.00    Types: Cigarettes    Last attempt to quit: 05/16/1989    Years since quitting: 28.2  . Smokeless tobacco:  Never Used  Substance and Sexual Activity  . Alcohol use: Yes    Alcohol/week: 8.4 oz    Types: 7 Glasses of wine, 7 Standard drinks or equivalent per week  . Drug use: No  . Sexual activity: Not Currently  Other Topics Concern  . Not on file  Social History Narrative   Pt has a child who passed away at age 25        Objective:   Physical Exam  General: AAO x3, NAD  Dermatological: Hyperkeratotic lesions present along the lateral hallux and the medial second toe on the IPJ from the toes are rubbing on the left side worse than the right.  Upon debridement there is no underlying ulceration, drainage or any clinical signs of infection.  There is no other open  lesions or pre-ulcerative lesion identified bilaterally.  Vascular: Dorsalis Pedis artery and Posterior Tibial artery pedal pulses are 2/4 bilateral with immedate capillary fill time.  There is no pain with calf compression, swelling, warmth, erythema.   Neruologic: Grossly intact via light touch bilateral. Protective threshold with Semmes Wienstein monofilament intact to all pedal sites bilateral.   Musculoskeletal: HAV is present.  No pain, crepitus, or limitation noted with foot and ankle range of motion bilateral. Muscular strength 5/5 in all groups tested bilateral.  Gait: Unassisted, Nonantalgic.     Assessment & Plan:  77 year old female with hyperkeratotic lesions due to digital deformity bilaterally. -Treatment options discussed including all alternatives, risks, and complications -Etiology of symptoms were discussed -X-rays were obtained and reviewed with the patient.  HAV is present.  There is no evidence of acute fracture or stress fracture identified today.  I did review the x-rays with her. -I sharply debrided the hyperkeratotic lesions x4 without any complications or bleeding.  I dispensed various offloading pads that she can try.  We also discussed the change in shoes.  In the future if symptoms continue discussed surgical intervention continue conservative treatment for now.  Trula Slade DPM

## 2017-08-14 ENCOUNTER — Ambulatory Visit: Payer: Self-pay | Admitting: Podiatry

## 2017-08-17 DIAGNOSIS — R69 Illness, unspecified: Secondary | ICD-10-CM | POA: Diagnosis not present

## 2017-10-03 DIAGNOSIS — I10 Essential (primary) hypertension: Secondary | ICD-10-CM | POA: Diagnosis not present

## 2017-10-03 DIAGNOSIS — Z91013 Allergy to seafood: Secondary | ICD-10-CM | POA: Diagnosis not present

## 2017-10-03 DIAGNOSIS — E669 Obesity, unspecified: Secondary | ICD-10-CM | POA: Diagnosis not present

## 2017-10-03 DIAGNOSIS — Z87891 Personal history of nicotine dependence: Secondary | ICD-10-CM | POA: Diagnosis not present

## 2017-10-03 DIAGNOSIS — Z8249 Family history of ischemic heart disease and other diseases of the circulatory system: Secondary | ICD-10-CM | POA: Diagnosis not present

## 2017-10-03 DIAGNOSIS — Z6833 Body mass index (BMI) 33.0-33.9, adult: Secondary | ICD-10-CM | POA: Diagnosis not present

## 2017-10-24 DIAGNOSIS — D2262 Melanocytic nevi of left upper limb, including shoulder: Secondary | ICD-10-CM | POA: Diagnosis not present

## 2017-10-24 DIAGNOSIS — D225 Melanocytic nevi of trunk: Secondary | ICD-10-CM | POA: Diagnosis not present

## 2017-10-24 DIAGNOSIS — D2271 Melanocytic nevi of right lower limb, including hip: Secondary | ICD-10-CM | POA: Diagnosis not present

## 2017-10-24 DIAGNOSIS — L821 Other seborrheic keratosis: Secondary | ICD-10-CM | POA: Diagnosis not present

## 2017-10-24 DIAGNOSIS — D2261 Melanocytic nevi of right upper limb, including shoulder: Secondary | ICD-10-CM | POA: Diagnosis not present

## 2017-10-24 DIAGNOSIS — Z85828 Personal history of other malignant neoplasm of skin: Secondary | ICD-10-CM | POA: Diagnosis not present

## 2017-10-24 DIAGNOSIS — D2272 Melanocytic nevi of left lower limb, including hip: Secondary | ICD-10-CM | POA: Diagnosis not present

## 2017-10-24 DIAGNOSIS — Z08 Encounter for follow-up examination after completed treatment for malignant neoplasm: Secondary | ICD-10-CM | POA: Diagnosis not present

## 2017-10-24 DIAGNOSIS — L309 Dermatitis, unspecified: Secondary | ICD-10-CM | POA: Diagnosis not present

## 2017-12-22 ENCOUNTER — Ambulatory Visit (INDEPENDENT_AMBULATORY_CARE_PROVIDER_SITE_OTHER): Payer: Medicare HMO

## 2017-12-22 VITALS — BP 142/76 | HR 60 | Temp 98.5°F | Ht 68.0 in | Wt 216.2 lb

## 2017-12-22 DIAGNOSIS — Z0000 Encounter for general adult medical examination without abnormal findings: Secondary | ICD-10-CM

## 2017-12-22 NOTE — Patient Instructions (Signed)
Lauren Lloyd , Thank you for taking time to come for your Medicare Wellness Visit. I appreciate your ongoing commitment to your health goals. Please review the following plan we discussed and let me know if I can assist you in the future.   Screening recommendations/referrals: Colonoscopy: Up to date Mammogram: Up to date Bone Density: Up to date Recommended yearly ophthalmology/optometry visit for glaucoma screening and checkup Recommended yearly dental visit for hygiene and checkup  Vaccinations: Influenza vaccine: N/A Pneumococcal vaccine: Up to date Tdap vaccine: Up to date Shingles vaccine: Pt declines today.     Advanced directives: Please bring a copy of your POA (Power of Attorney) and/or Living Will to your next appointment.   Conditions/risks identified: Obesity; Recommend cutting back on coffee intake to 2 cups a day and increase water intake.   Next appointment: 02/28/18 @ 1:40 PM with Dr Brita Romp. Pt declined scheduling the AWV for 2020.    Preventive Care 78 Years and Older, Female Preventive care refers to lifestyle choices and visits with your health care provider that can promote health and wellness. What does preventive care include?  A yearly physical exam. This is also called an annual well check.  Dental exams once or twice a year.  Routine eye exams. Ask your health care provider how often you should have your eyes checked.  Personal lifestyle choices, including:  Daily care of your teeth and gums.  Regular physical activity.  Eating a healthy diet.  Avoiding tobacco and drug use.  Limiting alcohol use.  Practicing safe sex.  Taking low-dose aspirin every day.  Taking vitamin and mineral supplements as recommended by your health care provider. What happens during an annual well check? The services and screenings done by your health care provider during your annual well check will depend on your age, overall health, lifestyle risk factors, and  family history of disease. Counseling  Your health care provider may ask you questions about your:  Alcohol use.  Tobacco use.  Drug use.  Emotional well-being.  Home and relationship well-being.  Sexual activity.  Eating habits.  History of falls.  Memory and ability to understand (cognition).  Work and work Statistician.  Reproductive health. Screening  You may have the following tests or measurements:  Height, weight, and BMI.  Blood pressure.  Lipid and cholesterol levels. These may be checked every 5 years, or more frequently if you are over 35 years old.  Skin check.  Lung cancer screening. You may have this screening every year starting at age 78 if you have a 30-pack-year history of smoking and currently smoke or have quit within the past 15 years.  Fecal occult blood test (FOBT) of the stool. You may have this test every year starting at age 78.  Flexible sigmoidoscopy or colonoscopy. You may have a sigmoidoscopy every 5 years or a colonoscopy every 10 years starting at age 78.  Hepatitis C blood test.  Hepatitis B blood test.  Sexually transmitted disease (STD) testing.  Diabetes screening. This is done by checking your blood sugar (glucose) after you have not eaten for a while (fasting). You may have this done every 1-3 years.  Bone density scan. This is done to screen for osteoporosis. You may have this done starting at age 78.  Mammogram. This may be done every 1-2 years. Talk to your health care provider about how often you should have regular mammograms. Talk with your health care provider about your test results, treatment options, and if necessary,  the need for more tests. Vaccines  Your health care provider may recommend certain vaccines, such as:  Influenza vaccine. This is recommended every year.  Tetanus, diphtheria, and acellular pertussis (Tdap, Td) vaccine. You may need a Td booster every 10 years.  Zoster vaccine. You may need this  after age 78.  Pneumococcal 13-valent conjugate (PCV13) vaccine. One dose is recommended after age 26.  Pneumococcal polysaccharide (PPSV23) vaccine. One dose is recommended after age 78. Talk to your health care provider about which screenings and vaccines you need and how often you need them. This information is not intended to replace advice given to you by your health care provider. Make sure you discuss any questions you have with your health care provider. Document Released: 05/29/2015 Document Revised: 01/20/2016 Document Reviewed: 03/03/2015 Elsevier Interactive Patient Education  2017 [place] Prevention in the Home Falls can cause injuries. They can happen to people of all ages. There are many things you can do to make your home safe and to help prevent falls. What can I do on the outside of my home?  Regularly fix the edges of walkways and driveways and fix any cracks.  Remove anything that might make you trip as you walk through a door, such as a raised step or threshold.  Trim any bushes or trees on the path to your home.  Use bright outdoor lighting.  Clear any walking paths of anything that might make someone trip, such as rocks or tools.  Regularly check to see if handrails are loose or broken. Make sure that both sides of any steps have handrails.  Any raised decks and porches should have guardrails on the edges.  Have any leaves, snow, or ice cleared regularly.  Use sand or salt on walking paths during winter.  Clean up any spills in your garage right away. This includes oil or grease spills. What can I do in the bathroom?  Use night lights.  Install grab bars by the toilet and in the tub and shower. Do not use towel bars as grab bars.  Use non-skid mats or decals in the tub or shower.  If you need to sit down in the shower, use a plastic, non-slip stool.  Keep the floor dry. Clean up any water that spills on the floor as soon as it  happens.  Remove soap buildup in the tub or shower regularly.  Attach bath mats securely with double-sided non-slip rug tape.  Do not have throw rugs and other things on the floor that can make you trip. What can I do in the bedroom?  Use night lights.  Make sure that you have a light by your bed that is easy to reach.  Do not use any sheets or blankets that are too big for your bed. They should not hang down onto the floor.  Have a firm chair that has side arms. You can use this for support while you get dressed.  Do not have throw rugs and other things on the floor that can make you trip. What can I do in the kitchen?  Clean up any spills right away.  Avoid walking on wet floors.  Keep items that you use a lot in easy-to-reach places.  If you need to reach something above you, use a strong step stool that has a grab bar.  Keep electrical cords out of the way.  Do not use floor polish or wax that makes floors slippery. If you must use  wax, use non-skid floor wax.  Do not have throw rugs and other things on the floor that can make you trip. What can I do with my stairs?  Do not leave any items on the stairs.  Make sure that there are handrails on both sides of the stairs and use them. Fix handrails that are broken or loose. Make sure that handrails are as long as the stairways.  Check any carpeting to make sure that it is firmly attached to the stairs. Fix any carpet that is loose or worn.  Avoid having throw rugs at the top or bottom of the stairs. If you do have throw rugs, attach them to the floor with carpet tape.  Make sure that you have a light switch at the top of the stairs and the bottom of the stairs. If you do not have them, ask someone to add them for you. What else can I do to help prevent falls?  Wear shoes that:  Do not have high heels.  Have rubber bottoms.  Are comfortable and fit you well.  Are closed at the toe. Do not wear sandals.  If you  use a stepladder:  Make sure that it is fully opened. Do not climb a closed stepladder.  Make sure that both sides of the stepladder are locked into place.  Ask someone to hold it for you, if possible.  Clearly mark and make sure that you can see:  Any grab bars or handrails.  First and last steps.  Where the edge of each step is.  Use tools that help you move around (mobility aids) if they are needed. These include:  Canes.  Walkers.  Scooters.  Crutches.  Turn on the lights when you go into a dark area. Replace any light bulbs as soon as they burn out.  Set up your furniture so you have a clear path. Avoid moving your furniture around.  If any of your floors are uneven, fix them.  If there are any pets around you, be aware of where they are.  Review your medicines with your doctor. Some medicines can make you feel dizzy. This can increase your chance of falling. Ask your doctor what other things that you can do to help prevent falls. This information is not intended to replace advice given to you by your health care provider. Make sure you discuss any questions you have with your health care provider. Document Released: 02/26/2009 Document Revised: 10/08/2015 Document Reviewed: 06/06/2014 Elsevier Interactive Patient Education  2017 [employer].

## 2017-12-22 NOTE — Progress Notes (Signed)
Subjective:   Lauren Lloyd is a 77 y.o. female who presents for Medicare Annual (Subsequent) preventive examination.  Review of Systems:  N/A  Cardiac Risk Factors include: advanced age (>52men, >14 women);hypertension;obesity (BMI >30kg/m2)     Objective:     Vitals: BP (!) 142/76 (BP Location: Right Arm)   Pulse 60   Temp 98.5 F (36.9 C) (Oral)   Ht 5\' 8"  (1.727 m)   Wt 216 lb 3.2 oz (98.1 kg)   BMI 32.87 kg/m   Body mass index is 32.87 kg/m.  Advanced Directives 12/22/2017 01/03/2017 12/14/2016 12/01/2016 02/22/2016 01/25/2016 11/04/2015  Does Patient Have a Medical Advance Directive? Yes Yes Yes Yes Yes Yes Yes  Type of Paramedic of [place];Living will [place];Living will [place];Living will [place];Living will [place];Living will [place];Living will [place];Living will  Does patient want to make changes to medical advance directive? - - - - - No - Patient declined -  Copy of [place] in Chart? No - copy requested - - No - copy requested Yes - -    Tobacco Social History   Tobacco Use  Smoking Status Former Smoker  . Packs/day: 1.00  . Years: 30.00  . Pack years: 30.00  . Types: Cigarettes  . Last attempt to quit: 05/16/1989  . Years since quitting: 28.6  Smokeless Tobacco Never Used     Counseling given: Not Answered   Clinical Intake:  Pre-visit preparation completed: Yes  Pain Score: 0-No pain     Nutritional Status: BMI > 30  Obese Nutritional Risks: Nausea/ vomitting/ diarrhea(diarrhea occasionally due to metamucil) Diabetes: No  How often do you need to have someone help you when you read instructions, pamphlets, or other written materials from your doctor or pharmacy?: 1 - Never  Interpreter Needed?: No  Information entered by :: [facility], LPN  Past Medical History:  Diagnosis  Date  . Arthritis    knees, Hands  . GERD (gastroesophageal reflux disease)    Past Surgical History:  Procedure Laterality Date  . CATARACT EXTRACTION W/PHACO Right 01/25/2016   Procedure: CATARACT EXTRACTION PHACO AND INTRAOCULAR LENS PLACEMENT (IOC);  Surgeon: Ronnell Freshwater, MD;  Location: [facility];  Service: Ophthalmology;  Laterality: Right;  RIGHT  . CATARACT EXTRACTION W/PHACO Left 02/22/2016   Procedure: CATARACT EXTRACTION PHACO AND INTRAOCULAR LENS PLACEMENT (IOC);  Surgeon: Ronnell Freshwater, MD;  Location: [place];  Service: Ophthalmology;  Laterality: Left;  LEFT  . [place]  2010  . KNEE ARTHROSCOPY Right 2004  . REPLACEMENT TOTAL KNEE Right 2009   [facility]  . SKIN GRAFT Left 04/08/2013   Done on left index finger  . TONSILLECTOMY  1946   Family History  Problem Relation Age of Onset  . Hyperlipidemia Sister   . Atrial fibrillation Sister   . Transient ischemic attack Mother   . Heart attack Father   . Healthy Brother   . Breast cancer Sister 4  . Hyperlipidemia Sister   . Atrial fibrillation Sister    Social History   Socioeconomic History  . Marital status: Divorced    Spouse name: Not on file  . Number of children: 5  . Years of education: college  . Highest education level: Bachelor's degree (e.g., BA, AB, BS)  Occupational History  . Occupation: Part Time    Employer: [employer]  [employer]  Social Needs  . Financial resource strain: Not hard at all  . Food insecurity:    Worry: Never true    Inability: Never true  . Transportation needs:    Medical: No    Non-medical: No  Tobacco Use  . Smoking status: Former Smoker    Packs/day: 1.00    Years: 30.00    Pack years: 30.00    Types: Cigarettes    Last attempt to quit: 05/16/1989    Years since quitting: 28.6  . Smokeless tobacco: Never Used  Substance and Sexual Activity  . Alcohol use: Yes    Alcohol/week: 14.0 standard  drinks    Types: 7 Glasses of wine, 7 Standard drinks or equivalent per week  . Drug use: No  . Sexual activity: Not Currently  Lifestyle  . Physical activity:    Days per week: Not on file    Minutes per session: Not on file  . Stress: Not at all  Relationships  . Social connections:    Talks on phone: Not on file    Gets together: Not on file    Attends religious service: Not on file    Active member of club or organization: Not on file    Attends meetings of clubs or organizations: Not on file    Relationship status: Not on file  Other Topics Concern  . Not on file  Social History Narrative   Pt has a child who passed away at age 1    Outpatient Encounter Medications as of 12/22/2017  Medication Sig  . Cyanocobalamin (VITAMIN B-12) 1000 MCG SUBL Place under the tongue daily.  . hydrochlorothiazide (MICROZIDE) 12.5 MG capsule Take 1 capsule (12.5 mg total) by mouth daily.  . Multiple Vitamin (MULTIVITAMIN) capsule Take 1 capsule by mouth daily.  . naproxen sodium (ALEVE) 220 MG tablet Take 220 mg by mouth 2 (two) times daily as needed.  . chlorpheniramine-HYDROcodone (TUSSIONEX PENNKINETIC ER) 10-8 MG/5ML SUER Take 5 mLs by mouth every 12 (twelve) hours as needed for cough. (Patient not taking: Reported on 12/22/2017)  . VENTOLIN HFA 108 (90 Base) MCG/ACT inhaler Inhale 1-2 puffs into the lungs every 6 (six) hours as needed for shortness of breath. (Patient not taking: Reported on 06/15/2017)   No facility-administered encounter medications on file as of 12/22/2017.     Activities of Daily Living In your present state of health, do you have any difficulty performing the following activities: 12/22/2017  Hearing? N  Vision? N  Difficulty concentrating or making decisions? N  Walking or climbing stairs? Y  Comment Due to SOB.  Dressing or bathing? N  Doing errands, shopping? N  Preparing Food and eating ? N  Using the Toilet? N  In the past six months, have you accidently leaked  urine? N  Do you have problems with loss of bowel control? N  Managing your Medications? N  Managing your Finances? N  Housekeeping or managing your Housekeeping? N  Some recent data might be hidden    Patient Care Team: Bacigalupo, Dionne Bucy, MD as PCP - General (Family Medicine) Wellington Hampshire, MD as Consulting Physician (Cardiology)    Assessment:   This is a routine wellness examination for Lauren Lloyd.  Exercise Activities and Dietary recommendations Current Exercise Habits: Structured exercise class, Type of exercise: walking;strength training/weights;stretching, Time (Minutes): 45, Frequency (Times/Week): 4, Weekly Exercise (Minutes/Week): 180, Exercise limited by: orthopedic condition(s)  Goals    . Reduce caffeine intake  Recommend cutting back on coffee intake to 2 cups a day and increase water intake.     . Reduce portion size     Recommend decreasing portion sizes for each meal (3) and add in 2 healthy snacks in between.        Fall Risk Fall Risk  12/22/2017 12/01/2016 11/04/2015 06/11/2015  Falls in the past year? Yes Yes Yes No  Number falls in past yr: 2 or more 2 or more 2 or more -  Comment "trips" tripped x 2 - -  Injury with Fall? No No No -  Follow up Falls prevention discussed Falls prevention discussed - -   Is the patient's home free of loose throw rugs in walkways, pet beds, electrical cords, etc?   yes      Grab bars in the bathroom? no      Handrails on the stairs?   yes      Adequate lighting?   yes  Timed Get Up and Go performed: N/A  Depression Screen PHQ 2/9 Scores 12/22/2017 12/22/2017 12/01/2016 12/01/2016  PHQ - 2 Score 0 0 0 0  PHQ- 9 Score 3 - 3 -     Cognitive Function: Pt declined screening today.      6CIT Screen 12/01/2016  What Year? 0 points  What month? 0 points  What time? 0 points  Count back from 20 0 points  Months in reverse 0 points  Repeat phrase 0 points  Total Score 0    Immunization History  Administered Date(s)  Administered  . Hepatitis A 11/25/1999, 09/19/2001  . Hepatitis A, Adult 11/25/1999, 09/19/2001  . IPV 02/11/2000  . Pneumococcal Conjugate-13 10/06/2014  . Pneumococcal Polysaccharide-23 12/01/2010  . Td 02/02/1998  . Tdap 09/11/2007, 02/27/2012  . Typhoid Inactivated 02/11/2000    Qualifies for Shingles Vaccine? Due for Shingles vaccine. Declined my offer to administer today. Education has been provided regarding the importance of this vaccine. Pt has been advised to call her insurance company to determine her out of pocket expense. Advised she may also receive this vaccine at her local pharmacy or Health Dept. Verbalized acceptance and understanding.  Screening Tests Health Maintenance  Topic Date Due  . INFLUENZA VACCINE  12/14/2017  . TETANUS/TDAP  02/26/2022  . DEXA SCAN  Completed  . PNA vac Low Risk Adult  Completed    Cancer Screenings: Lung: Low Dose CT Chest recommended if Age 63-80 years, 30 pack-year currently smoking OR have quit w/in 15years. Patient does not qualify. Breast:  Up to date on Mammogram? Yes   Up to date of Bone Density/Dexa? Yes Colorectal: Up to date  Additional Screenings:  Hepatitis C Screening: N/A     Plan:  I have personally reviewed and addressed the Medicare Annual Wellness questionnaire and have noted the following in the patient's chart:  A. Medical and social history B. Use of alcohol, tobacco or illicit drugs  C. Current medications and supplements D. Functional ability and status E.  Nutritional status F.  Physical activity G. Advance directives H. List of other physicians I.  Hospitalizations, surgeries, and ER visits in previous 12 months J.  [place] such as hearing and vision if needed, cognitive and depression L. Referrals and appointments - none  In addition, I have reviewed and discussed with patient certain preventive protocols, quality metrics, and best practice recommendations. A written personalized care  plan for preventive services as well as general preventive health recommendations were provided to patient.  See  attached scanned questionnaire for additional information.   Signed,  Fabio Neighbors, LPN Nurse Health Advisor   Nurse Recommendations: None.

## 2018-01-02 DIAGNOSIS — M531 Cervicobrachial syndrome: Secondary | ICD-10-CM | POA: Diagnosis not present

## 2018-01-02 DIAGNOSIS — M9901 Segmental and somatic dysfunction of cervical region: Secondary | ICD-10-CM | POA: Diagnosis not present

## 2018-01-25 ENCOUNTER — Other Ambulatory Visit: Payer: Self-pay | Admitting: Family Medicine

## 2018-02-21 DIAGNOSIS — R69 Illness, unspecified: Secondary | ICD-10-CM | POA: Diagnosis not present

## 2018-02-22 ENCOUNTER — Other Ambulatory Visit: Payer: Self-pay | Admitting: Family Medicine

## 2018-02-22 DIAGNOSIS — Z1231 Encounter for screening mammogram for malignant neoplasm of breast: Secondary | ICD-10-CM

## 2018-02-23 ENCOUNTER — Ambulatory Visit (INDEPENDENT_AMBULATORY_CARE_PROVIDER_SITE_OTHER): Payer: Medicare HMO | Admitting: Family Medicine

## 2018-02-23 ENCOUNTER — Encounter: Payer: Self-pay | Admitting: Family Medicine

## 2018-02-23 VITALS — BP 148/72 | HR 76 | Temp 98.3°F | Wt 217.6 lb

## 2018-02-23 DIAGNOSIS — R310 Gross hematuria: Secondary | ICD-10-CM | POA: Diagnosis not present

## 2018-02-23 DIAGNOSIS — I10 Essential (primary) hypertension: Secondary | ICD-10-CM | POA: Diagnosis not present

## 2018-02-23 DIAGNOSIS — N3091 Cystitis, unspecified with hematuria: Secondary | ICD-10-CM

## 2018-02-23 LAB — POCT URINALYSIS DIPSTICK
Bilirubin, UA: NEGATIVE
GLUCOSE UA: NEGATIVE
KETONES UA: NEGATIVE
NITRITE UA: NEGATIVE
PROTEIN UA: POSITIVE — AB
Spec Grav, UA: 1.025 (ref 1.010–1.025)
Urobilinogen, UA: 0.2 E.U./dL
pH, UA: 5 (ref 5.0–8.0)

## 2018-02-23 MED ORDER — HYDROCHLOROTHIAZIDE 12.5 MG PO CAPS: 12.5000 mg | ORAL_CAPSULE | Freq: Every day | ORAL | 1 refills | Status: DC

## 2018-02-23 MED ORDER — SULFAMETHOXAZOLE-TRIMETHOPRIM 800-160 MG PO TABS: 1.0000 | ORAL_TABLET | Freq: Two times a day (BID) | ORAL | 0 refills | Status: DC

## 2018-02-23 NOTE — Progress Notes (Signed)
Patient: Lauren Lloyd Female    DOB: 06/24/1940   77 y.o.   MRN: [id number] Visit Date: 02/23/2018  Today's Provider: Lavon Paganini, MD   Chief Complaint  Patient presents with  . Dysuria   Subjective:    I, Tiburcio Pea, CMA, am acting as a scribe for Lavon Paganini, MD.   Dysuria   This is a new problem. The current episode started yesterday. The problem occurs every urination. The problem has been gradually worsening. The quality of the pain is described as burning and aching. There has been no fever. Associated symptoms include a discharge, frequency, hematuria and urgency. Pertinent negatives include no nausea, possible pregnancy, sweats or vomiting. Associated symptoms comments: Abdominal pain and back pain . She has tried nothing for the symptoms.   Last UTI in 02/2017 was first ever.  Urine culture showed pan-sensitive E coli    Allergies  Allergen Reactions  . Levofloxacin     Other reaction(s): Joint Pains Other reaction(s): Other (See Comments) Joint pain  . Influenza Vaccines Other (See Comments)    Bell's Palsy  . Oysters [Shellfish Allergy] Swelling    She states she had eaten them three days in a row and she developed swelling around her eyes.      Current Outpatient Medications:  .  Cyanocobalamin (VITAMIN B-12) 1000 MCG SUBL, Place under the tongue daily., Disp: , Rfl:  .  hydrochlorothiazide (MICROZIDE) 12.5 MG capsule, Take 1 capsule (12.5 mg total) by mouth daily., Disp: 90 capsule, Rfl: 1 .  Multiple Vitamin (MULTIVITAMIN) capsule, Take 1 capsule by mouth daily., Disp: , Rfl:  .  naproxen sodium (ALEVE) 220 MG tablet, Take 220 mg by mouth 2 (two) times daily as needed., Disp: , Rfl:  .  sulfamethoxazole-trimethoprim (BACTRIM DS,SEPTRA DS) 800-160 MG tablet, Take 1 tablet by mouth 2 (two) times daily., Disp: 10 tablet, Rfl: 0 .  VENTOLIN HFA 108 (90 Base) MCG/ACT inhaler, Inhale 1-2 puffs into the lungs every 6 (six) hours as needed for  shortness of breath. (Patient not taking: Reported on 02/23/2018), Disp: 1 Inhaler, Rfl: 3  Review of Systems  Constitutional: Negative.   Respiratory: Negative.   Cardiovascular: Negative.   Gastrointestinal: Negative for nausea and vomiting.  Genitourinary: Positive for dysuria, frequency, hematuria and urgency.  Musculoskeletal: Negative.     Social History   Tobacco Use  . Smoking status: Former Smoker    Packs/day: 1.00    Years: 30.00    Pack years: 30.00    Types: Cigarettes    Last attempt to quit: 05/16/1989    Years since quitting: 28.7  . Smokeless tobacco: Never Used  Substance Use Topics  . Alcohol use: Yes    Alcohol/week: 14.0 standard drinks    Types: 7 Glasses of wine, 7 Standard drinks or equivalent per week   Objective:   BP (!) 148/72 (BP Location: Right Arm, Patient Position: Sitting, Cuff Size: Large)   Pulse 76   Temp 98.3 F (36.8 C) (Oral)   Wt 217 lb 9.6 oz (98.7 kg)   SpO2 99%   BMI 33.09 kg/m  Vitals:   02/23/18 1004  BP: (!) 148/72  Pulse: 76  Temp: 98.3 F (36.8 C)  TempSrc: Oral  SpO2: 99%  Weight: 217 lb 9.6 oz (98.7 kg)     Physical Exam  Constitutional: She is oriented to person, place, and time. She appears well-developed and well-nourished. No distress.  HENT:  Head: Normocephalic and atraumatic.  Eyes: Conjunctivae are normal. No scleral icterus.  Neck: Neck supple. No thyromegaly present.  Cardiovascular: Normal rate, regular rhythm, normal heart sounds and intact distal pulses.  No murmur heard. Pulmonary/Chest: Effort normal and breath sounds normal. No respiratory distress. She has no wheezes. She has no rales.  Abdominal: Soft. She exhibits no distension. There is no tenderness. There is no guarding and no CVA tenderness.  Musculoskeletal: She exhibits no edema.  Lymphadenopathy:    She has no cervical adenopathy.  Neurological: She is alert and oriented to person, place, and time.  Skin: Skin is warm and dry.  Capillary refill takes less than 2 seconds. No rash noted.  Psychiatric: She has a normal mood and affect. Her behavior is normal.  Vitals reviewed.    Results for orders placed or performed in visit on 02/23/18  POCT Urinalysis Dipstick  Result Value Ref Range   Color, UA tea colored    Clarity, UA cloudy    Glucose, UA Negative Negative   Bilirubin, UA negative    Ketones, UA negative    Spec Grav, UA 1.025 1.010 - 1.025   Blood, UA hemolyzed large    pH, UA 5.0 5.0 - 8.0   Protein, UA Positive (A) Negative   Urobilinogen, UA 0.2 0.2 or 1.0 E.U./dL   Nitrite, UA negative    Leukocytes, UA Small (1+) (A) Negative   Appearance     Odor         Assessment & Plan:   Problem List Items Addressed This Visit      Cardiovascular and Mediastinum   Hypertension    Previously well controlled but elevated today Will continue HCTZ 12.5mg  daily Can consider increase if BP still elevated at CPE when not sick and in pain      Relevant Medications   hydrochlorothiazide (MICROZIDE) 12.5 MG capsule     Other   Hematuria   Relevant Orders   Urine Culture   Urinalysis, microscopic only    Other Visit Diagnoses    Cystitis with hematuria    -  Primary   Relevant Orders   POCT Urinalysis Dipstick (Completed)   Urine Culture   Urinalysis, microscopic only    - UA c/w significant UTI with gross hematuria - this is similar to last UTI - will send urine micro and culture for sensitivities - wil ltreat with 5 d course of Bactrim - will need to recheck UA in ~6 weeks to ensure clearance of hematuria - no signs of systemic illness or nephrolithiasis - return precautions discussed   Return in about 6 weeks (around 04/06/2018) for CPE (need to recheck urine for blood).   [name], [name]. Portions of this information were initially documented by Tiburcio Pea, CMA and reviewed by me for thoroughness and accuracy.    [name], MD, MPH [facility] 02/23/2018 11:25 AM

## 2018-02-23 NOTE — Patient Instructions (Signed)

## 2018-02-23 NOTE — Assessment & Plan Note (Signed)
Previously well controlled but elevated today Will continue HCTZ 12.5mg  daily Can consider increase if BP still elevated at CPE when not sick and in pain

## 2018-02-24 LAB — URINALYSIS, MICROSCOPIC ONLY: CASTS: NONE SEEN /LPF

## 2018-02-25 LAB — URINE CULTURE

## 2018-02-28 ENCOUNTER — Encounter: Payer: Self-pay | Admitting: Family Medicine

## 2018-02-28 ENCOUNTER — Ambulatory Visit
Admission: RE | Admit: 2018-02-28 | Discharge: 2018-02-28 | Disposition: A | Discharge status: Home / Self Care | Payer: Medicare HMO | Source: Ambulatory Visit | Attending: Family Medicine | Admitting: Family Medicine

## 2018-02-28 DIAGNOSIS — Z1231 Encounter for screening mammogram for malignant neoplasm of breast: Secondary | ICD-10-CM | POA: Diagnosis not present

## 2018-03-06 ENCOUNTER — Ambulatory Visit
Admission: RE | Admit: 2018-03-06 | Discharge: 2018-03-06 | Disposition: A | Discharge status: Home / Self Care | Payer: Medicare HMO | Source: Ambulatory Visit | Attending: Family Medicine | Admitting: Family Medicine

## 2018-03-06 ENCOUNTER — Ambulatory Visit (INDEPENDENT_AMBULATORY_CARE_PROVIDER_SITE_OTHER): Payer: Medicare HMO | Admitting: Family Medicine

## 2018-03-06 ENCOUNTER — Encounter: Payer: Self-pay | Admitting: Family Medicine

## 2018-03-06 VITALS — BP 140/70 | HR 64 | Temp 98.3°F | Resp 16 | Wt 219.6 lb

## 2018-03-06 DIAGNOSIS — R10811 Right upper quadrant abdominal tenderness: Secondary | ICD-10-CM

## 2018-03-06 DIAGNOSIS — R197 Diarrhea, unspecified: Secondary | ICD-10-CM | POA: Diagnosis not present

## 2018-03-06 DIAGNOSIS — R1030 Lower abdominal pain, unspecified: Secondary | ICD-10-CM

## 2018-03-06 LAB — POCT URINALYSIS DIPSTICK
Appearance: NORMAL
Bilirubin, UA: NEGATIVE
Blood, UA: NEGATIVE
GLUCOSE UA: NEGATIVE
Ketones, UA: NEGATIVE
LEUKOCYTES UA: NEGATIVE
Nitrite, UA: NEGATIVE
Odor: NEGATIVE
Protein, UA: NEGATIVE
SPEC GRAV UA: 1.015 (ref 1.010–1.025)
Urobilinogen, UA: 0.2 E.U./dL
pH, UA: 6 (ref 5.0–8.0)

## 2018-03-06 NOTE — Progress Notes (Signed)
Patient: Lauren Lloyd Female    DOB: 06/14/1940   77 y.o.   MRN: [id number] Visit Date: 03/06/2018  Today's Provider: Lavon Paganini, MD   Chief Complaint  Patient presents with  . Back Pain  . Abdominal Cramping   Subjective:    HPI Patient here today with c/o lower back pain and cramping in her lower abdomen intermittently. This started over the weekend. She reports that she went to [facility] when the cramping started. She reports that they feel like "menstrual cramps".She was just treated a week ago for a UTI. She reports no Dysuria or hematuria. She is having normal bowel movements, but she does have intermittent fecal incontinence and loose stools that have been worse for last several days. She reports that she finished antibiotic.      Allergies  Allergen Reactions  . Levofloxacin     Other reaction(s): Joint Pains Other reaction(s): Other (See Comments) Joint pain  . Influenza Vaccines Other (See Comments)    Bell's Palsy  . Oysters [Shellfish Allergy] Swelling    She states she had eaten them three days in a row and she developed swelling around her eyes.      Current Outpatient Medications:  .  Cyanocobalamin (VITAMIN B-12) 1000 MCG SUBL, Place under the tongue daily., Disp: , Rfl:  .  hydrochlorothiazide (MICROZIDE) 12.5 MG capsule, Take 1 capsule (12.5 mg total) by mouth daily., Disp: 90 capsule, Rfl: 1 .  Multiple Vitamin (MULTIVITAMIN) capsule, Take 1 capsule by mouth daily., Disp: , Rfl:  .  naproxen sodium (ALEVE) 220 MG tablet, Take 220 mg by mouth 2 (two) times daily as needed., Disp: , Rfl:  .  VENTOLIN HFA 108 (90 Base) MCG/ACT inhaler, Inhale 1-2 puffs into the lungs every 6 (six) hours as needed for shortness of breath. (Patient not taking: Reported on 02/23/2018), Disp: 1 Inhaler, Rfl: 3  Review of Systems  Constitutional: Negative.   Respiratory: Negative.   Cardiovascular: Negative for chest pain, palpitations and leg swelling.    Gastrointestinal: Positive for abdominal pain, diarrhea and nausea. Negative for blood in stool, constipation, rectal pain and vomiting.  Genitourinary: Negative for difficulty urinating, pelvic pain, vaginal bleeding, vaginal discharge and vaginal pain.  Musculoskeletal: Positive for back pain ("Lower back pain").  Skin: Negative.   Neurological: Negative.     Social History   Tobacco Use  . Smoking status: Former Smoker    Packs/day: 1.00    Years: 30.00    Pack years: 30.00    Types: Cigarettes    Last attempt to quit: 05/16/1989    Years since quitting: 28.8  . Smokeless tobacco: Never Used  Substance Use Topics  . Alcohol use: Yes    Alcohol/week: 14.0 standard drinks    Types: 7 Glasses of wine, 7 Standard drinks or equivalent per week   Objective:   BP 140/70 (BP Location: Right Arm, Patient Position: Sitting, Cuff Size: Large)   Pulse 64   Temp 98.3 F (36.8 C) (Oral)   Resp 16   Wt 219 lb 9.6 oz (99.6 kg)   BMI 33.39 kg/m  Vitals:   03/06/18 0954  BP: 140/70  Pulse: 64  Resp: 16  Temp: 98.3 F (36.8 C)  TempSrc: Oral  Weight: 219 lb 9.6 oz (99.6 kg)     Physical Exam  Constitutional: She is oriented to person, place, and time. She appears well-developed and well-nourished. No distress.  HENT:  Head: Normocephalic and atraumatic.  Mouth/Throat: Oropharynx is clear and moist.  Eyes: Pupils are equal, round, and reactive to light. Conjunctivae are normal. No scleral icterus.  Neck: Neck supple. No thyromegaly present.  Cardiovascular: Normal rate, regular rhythm, normal heart sounds and intact distal pulses.  No murmur heard. Pulmonary/Chest: Effort normal and breath sounds normal. No respiratory distress. She has no wheezes. She has no rales.  Abdominal: Soft. Bowel sounds are normal. She exhibits no distension. There is tenderness in the right upper quadrant. There is positive Murphy's sign. There is no rigidity, no rebound, no guarding and no tenderness  at McBurney's point.  Lymphadenopathy:    She has no cervical adenopathy.  Neurological: She is alert and oriented to person, place, and time.  Skin: Skin is warm and dry. Capillary refill takes less than 2 seconds. No rash noted.  Psychiatric: She has a normal mood and affect. Her behavior is normal.  Vitals reviewed.   Results for orders placed or performed in visit on 03/06/18  POCT urinalysis dipstick  Result Value Ref Range   Color, UA Yellow    Clarity, UA Clear    Glucose, UA Negative Negative   Bilirubin, UA Negative    Ketones, UA Negative    Spec Grav, UA 1.015 1.010 - 1.025   Blood, UA Negative    pH, UA 6.0 5.0 - 8.0   Protein, UA Negative Negative   Urobilinogen, UA 0.2 0.2 or 1.0 E.U./dL   Nitrite, UA Negative    Leukocytes, UA Negative Negative   Appearance Normal    Odor Negative        Assessment & Plan:   1. Lower abdominal pain - new problem - UA now clear, so not liekly related to recent UTI - no TTP or concerning findings on exam - given loose stools and fecal incontinence, could be colitis related to recent abx use - could also be constipation with stool leaking around - will get Abd Xray to ensure no constipation - recommend probiotic - discussed strict return precautions - POCT urinalysis dipstick - DG Abd 2 Views; Future  2. Right upper quadrant abdominal tenderness without rebound tenderness - new problem, found on exam - patient with RUQ TTP and Murphy's sign - will obtain RUQ [name] to check for gallbladder pathology - US Abdomen Limited RUQ; Future   Return if symptoms worsen or fail to improve.   [name], [name]. Portions of this information were initially documented by Lyndel Pleasure, CMA and reviewed by me for thoroughness and accuracy.    [name], MD, MPH [facility] 03/07/2018  12:58 PM

## 2018-03-06 NOTE — Patient Instructions (Signed)
Colitis Colitis is inflammation of the colon. Colitis may last a short time (acute) or it may last a long time (chronic). What are the causes? This condition may be caused by:  Viruses.  Bacteria.  Reactions to medicine.  Certain autoimmune diseases, such as Crohn disease or ulcerative colitis.  What are the signs or symptoms? Symptoms of this condition include:  Diarrhea.  Passing bloody or tarry stool.  Pain.  Fever.  Vomiting.  Tiredness (fatigue).  Weight loss.  Bloating.  Sudden increase in abdominal pain.  Having fewer bowel movements than usual.  How is this diagnosed? This condition is diagnosed with a stool test or a blood test. You may also have other tests, including X-rays, a CT scan, or a colonoscopy. How is this treated? Treatment may include:  Resting the bowel. This involves not eating or drinking for a period of time.  Fluids that are given through an IV tube.  Medicine for pain and diarrhea.  Antibiotic medicines.  Cortisone medicines.  Surgery.  Follow these instructions at home: Eating and drinking  Follow instructions from your health care provider about eating or drinking restrictions.  Drink enough fluid to keep your urine clear or pale yellow.  Work with a dietitian to determine which foods cause your condition to flare up.  Avoid foods that cause flare-ups.  Eat a well-balanced diet. Medicines  Take over-the-counter and prescription medicines only as told by your health care provider.  If you were prescribed an antibiotic medicine, take it as told by your health care provider. Do not stop taking the antibiotic even if you start to feel better. General instructions  Keep all follow-up visits as told by your health care provider. This is important. Contact a health care provider if:  Your symptoms do not go away.  You develop new symptoms. Get help right away if:  You have a fever that does not go away with  treatment.  You develop chills.  You have extreme weakness, fainting, or dehydration.  You have repeated vomiting.  You develop severe pain in your abdomen.  You pass bloody or tarry stool. This information is not intended to replace advice given to you by your health care provider. Make sure you discuss any questions you have with your health care provider. Document Released: 06/09/2004 Document Revised: 10/08/2015 Document Reviewed: 08/25/2014 Elsevier Interactive Patient Education  2018 [employer]  

## 2018-03-07 ENCOUNTER — Telehealth: Payer: Self-pay

## 2018-03-07 NOTE — Telephone Encounter (Signed)
LMTCB

## 2018-03-07 NOTE — Telephone Encounter (Signed)
Pt returned call ° °teri °

## 2018-03-07 NOTE — Telephone Encounter (Signed)
Patient advised. [name] is scheduled for 03/08/2018 at 8:00am

## 2018-03-07 NOTE — Telephone Encounter (Signed)
-----   Message from [name], MD sent at 03/07/2018  8:26 AM EDT ----- Normal abd XRay. If pain persists or more frequent loose stools, may need further testing.  Will be interested to see [name] of gallbladder  Brita Romp, Dionne Bucy, MD, MPH [facility] 03/07/2018 8:26 AM

## 2018-03-08 ENCOUNTER — Ambulatory Visit
Admission: RE | Admit: 2018-03-08 | Discharge: 2018-03-08 | Disposition: A | Discharge status: Home / Self Care | Payer: Medicare HMO | Source: Ambulatory Visit | Attending: Family Medicine | Admitting: Family Medicine

## 2018-03-08 ENCOUNTER — Telehealth: Payer: Self-pay

## 2018-03-08 DIAGNOSIS — R10811 Right upper quadrant abdominal tenderness: Secondary | ICD-10-CM | POA: Diagnosis not present

## 2018-03-08 DIAGNOSIS — K8020 Calculus of gallbladder without cholecystitis without obstruction: Secondary | ICD-10-CM | POA: Insufficient documentation

## 2018-03-08 DIAGNOSIS — K760 Fatty (change of) liver, not elsewhere classified: Secondary | ICD-10-CM | POA: Insufficient documentation

## 2018-03-08 NOTE — Telephone Encounter (Signed)
Patient advised. She agrees to proceed with referral. She does not have a preference.

## 2018-03-08 NOTE — Telephone Encounter (Signed)
-----   Message from [name], MD sent at 03/08/2018  2:39 PM EDT ----- Gallstones are present on ultrasound of the gallbladder.  If you are symptomatic from these, such as you were earlier this week with the tenderness, I would recommend seeing a surgeon to discuss options for possible removal.  Let me know if ok to place referral and if she has a preference of who to see  [name], MD, MPH [facility] 03/08/2018 2:39 PM

## 2018-03-08 NOTE — Telephone Encounter (Signed)
Referral placed  Bacigalupo, Dionne Bucy, MD, MPH [facility] 03/08/2018 4:11 PM

## 2018-03-13 ENCOUNTER — Telehealth: Payer: Self-pay | Admitting: Family Medicine

## 2018-03-13 NOTE — Telephone Encounter (Signed)
Patient wants a CPE before the end of the year.    Can you squeeze her in any time before 05/15/18?

## 2018-03-14 ENCOUNTER — Telehealth: Payer: Self-pay

## 2018-03-14 NOTE — Telephone Encounter (Signed)
Patient called office stating that she was returning a call for Mickel Baas. KW

## 2018-03-14 NOTE — Telephone Encounter (Signed)
LMTCB 03/14/2018  Thanks,   -Mickel Baas

## 2018-03-19 ENCOUNTER — Encounter: Payer: Self-pay | Admitting: General Surgery

## 2018-03-19 ENCOUNTER — Ambulatory Visit: Payer: Medicare HMO | Admitting: General Surgery

## 2018-03-19 ENCOUNTER — Other Ambulatory Visit: Payer: Self-pay

## 2018-03-19 VITALS — BP 160/81 | HR 77 | Temp 97.2°F | Ht 68.0 in | Wt 216.8 lb

## 2018-03-19 DIAGNOSIS — K8020 Calculus of gallbladder without cholecystitis without obstruction: Secondary | ICD-10-CM | POA: Diagnosis not present

## 2018-03-19 DIAGNOSIS — K589 Irritable bowel syndrome without diarrhea: Secondary | ICD-10-CM

## 2018-03-19 NOTE — Progress Notes (Signed)
Lauren Lloyd is a 77 y/o F referred by her PCP, Lauren Lloyd, for evaluation of cholelithiasis.  According to Lauren Lloyd, she had had a UTI and was treated for this by her PCP. She then began experiencing additional pelvic-area discomfort and thought perhaps she had another UTI. She saw Lauren Lloyd who determined that she did not have another urinary tract infection, however on physical exam, there was a positive Murphy sign.  She underwent a RUQ ultrasound which revealed gallstones.  No GB wall thickening or pericholecystic fluid seen.  No sonographic Murphy sign.  In my review of the EMR, it sounds as though Lauren Lloyd RUQ symptoms had been going on longer than she described during our interview, perhaps 6 months or so.  She denies increased symptoms with fatty food intake.  She has never been jaundiced or had pasty-colored stools. She has never had pancreatitis.  She does report increased flatulence and a sensation of bloating, along with fecal urgency.  Her stools, however, are formed and she has never seen blood in her stools. She has tried Metamucil to try and regulate her bowel movements, but she did not notice any significant effect. She has also had fecal soilage of her underclothing.  She had a colonoscopy 2 years ago, which she reports as being unremarkable. The most recent I found in the EMR was 2015 and only noted internal hemorrhoids.    Past Medical History:  Diagnosis Date  . Arthritis    knees, Hands  . GERD (gastroesophageal reflux disease)    Past Surgical History:  Procedure Laterality Date  . CATARACT EXTRACTION W/PHACO Right 01/25/2016   Procedure: CATARACT EXTRACTION PHACO AND INTRAOCULAR LENS PLACEMENT (IOC);  Surgeon: Lauren Freshwater, MD;  Location: [place];  Service: Ophthalmology;  Laterality: Right;  RIGHT  . CATARACT EXTRACTION W/PHACO Left 02/22/2016   Procedure: CATARACT EXTRACTION PHACO AND INTRAOCULAR LENS PLACEMENT (IOC);  Surgeon: Lauren Freshwater, MD;  Location: [place];  Service: Ophthalmology;  Laterality: Left;  LEFT  . [place]  2010  . KNEE ARTHROSCOPY Right 2004  . REPLACEMENT TOTAL KNEE Right 2009   [facility]  . SKIN GRAFT Left 04/08/2013   Done on left index finger  . TONSILLECTOMY  1946   Family History  Problem Relation Age of Onset  . Hyperlipidemia Sister   . Atrial fibrillation Sister   . Transient ischemic attack Mother   . Heart attack Father   . Healthy Brother   . Breast cancer Sister 56  . Hyperlipidemia Sister   . Atrial fibrillation Sister    Social History   Socioeconomic History  . Marital status: Divorced    Spouse name: Not on file  . Number of children: 5  . Years of education: college  . Highest education level: Bachelor's degree (e.g., BA, AB, BS)  Occupational History  . Occupation: Part Time    Employer: [employer]  Social Needs  . Financial resource strain: Not hard at all  . Food insecurity:    Worry: Never true    Inability: Never true  . Transportation needs:    Medical: No    Non-medical: No  Tobacco Use  . Smoking status: Former Smoker    Packs/day: 1.00    Years: 30.00    Pack years: 30.00    Types: Cigarettes    Last attempt to quit: 05/16/1989    Years since quitting: 28.8  . Smokeless tobacco: Never Used  Substance and Sexual Activity  . Alcohol use: Yes    Alcohol/week: 14.0 standard drinks    Types: 7 Glasses of wine, 7 Standard drinks or equivalent per week  . Drug use: No  . Sexual activity: Not Currently  Lifestyle  . Physical activity:    Days per week: Not on file    Minutes per session: Not on file  . Stress: Not at all  Relationships  . Social connections:    Talks on phone: Not on file    Gets together: Not on file    Attends religious service: Not on file    Active member of club or organization: Not on file    Attends meetings of clubs or organizations: Not on file    Relationship  status: Not on file  . Intimate partner violence:    Fear of current or ex partner: Not on file    Emotionally abused: Not on file    Physically abused: Not on file    Forced sexual activity: Not on file  Other Topics Concern  . Not on file  Social History Narrative   Pt has a child who passed away at age 63   Current Meds  Medication Sig  . hydrochlorothiazide (MICROZIDE) 12.5 MG capsule Take 1 capsule (12.5 mg total) by mouth daily.  . Multiple Vitamin (MULTIVITAMIN) capsule Take 1 capsule by mouth daily.  . naproxen sodium (ALEVE) 220 MG tablet Take 220 mg by mouth 2 (two) times daily as needed.   Allergies  Allergen Reactions  . Levofloxacin     Other reaction(s): Joint Pains Other reaction(s): Other (See Comments) Joint pain  . Influenza Vaccines Other (See Comments)    Bell's Palsy  . Oysters [Shellfish Allergy] Swelling    She states she had eaten them three days in a row and she developed swelling around her eyes.    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  The remainder of a full ROS was otherwise negative.  PE: Vitals:   03/19/18 1007  BP: (!) 160/81  Pulse: 77  Temp: (!) 97.2 F (36.2 C)   GEN: A&O x 3.  NAD. HEENT: PERRL.  MMM.  No scleral icterus. No oropharyngeal exudate.  No palpable cervical LAD. CV: RRR. No murmur appreciated. Pulm: CTAB Abd: soft, NT/ND.  No pain with deep palpation; no Murphy sign.  Normoactive BS. Ext: warm without pitting edema.  Pedal pulses palpable. Psych: normal mood and affect.  Labs: Results for Lauren Lloyd (MRN [id number]) as of 03/19/2018 13:24  Ref. Range 12/28/2016 08:23 01/19/2017 15:22  COMPREHENSIVE METABOLIC PANEL Unknown Rpt (A)   Sodium Latest Ref Range: 135 - 146 mmol/L 143 143  Potassium Latest Ref Range: 3.5 - 5.3 mmol/L 5.0 3.7  Chloride Latest Ref Range: 98 - 110  mmol/L 107 (H) 105  CO2 Latest Ref Range: 20 - 32 mmol/L 23 30  Glucose Latest Ref Range: 65 - 99 mg/dL 94 95  BUN Latest Ref Range: 7 - 25 mg/dL 16 20  Creatinine Latest Ref Range: 0.60 - 0.93 mg/dL 0.71 0.78  Calcium Latest Ref Range: 8.6 - 10.4 mg/dL 9.2 9.1  BUN/Creatinine Ratio Latest Ref Range: 6 - 22 (calc) 23 NOT APPLICABLE  Alkaline Phosphatase Latest Ref Range: 39 - 117 IU/L 111   Albumin Latest Ref Range: 3.5 - 4.8 g/dL 4.0  Albumin/Globulin Ratio Latest Ref Range: 1.2 - 2.2  1.7   AST Latest Ref Range: 0 - 40 IU/L 19   ALT Latest Ref Range: 0 - 32 IU/L 11   Total Protein Latest Ref Range: 6.0 - 8.5 g/dL 6.4   Total Bilirubin Latest Ref Range: 0.0 - 1.2 mg/dL 0.3   GFR, Est Non African American Latest Ref Range: > OR = 60 mL/min/1.7m2 82 73  GFR, Est African American Latest Ref Range: > OR = 60 mL/min/1.20m2 95 85   Imaging:  CLINICAL DATA:  Right upper quadrant tenderness for 6 months.  EXAM: ULTRASOUND ABDOMEN LIMITED RIGHT UPPER QUADRANT  COMPARISON:  02/21/2006.  FINDINGS: Gallbladder:  Shadowing echogenic stones measure up to 5 mm and are mobile. No wall thickening or sonographic Murphy sign.  Common bile duct:  Diameter: 4 mm, within normal limits.  Liver:  Diffusely increased in echogenicity. No focal lesion. Portal vein is patent on color Doppler imaging with normal direction of blood flow towards the liver.  IMPRESSION: 1. Hepatic steatosis. 2. Cholelithiasis.   Electronically Signed   By: Lorin Picket M.D.   On: 03/08/2018 11:50  I personally reviewed the imaging described above.  A/P: 77 y/o F with cholelithiasis. She does not actually seem to be having significant symptoms from this and would prefer not to undergo surgery at this time. She is actually more concerned with her lower abdominal discomfort, fecal urgency, and soilage.  We discussed that there may be biofeedback and therapy options for her leakage, but also that  some of her symptoms may be related to irritable bowel syndrome. She declined interest in any kind of specific continence therapy, but was interested in seeing gastroenterology for further evaluation of possible IBS.  Referral placed. No specific follow up made at this time, however, if she develops worsening symptoms related to her gallstones, she is welcome to return at any time.

## 2018-03-19 NOTE — Patient Instructions (Addendum)
Patient needs to be referred to a GI doctor (female) for IBS she is to follow up with Dr.Cannon as needed. She has been informed to call the office with any questions and concerns.    Low-Fat Diet for Pancreatitis or Gallbladder Conditions A low-fat diet can be helpful if you have pancreatitis or a gallbladder condition. With these conditions, your pancreas and gallbladder have trouble digesting fats. A healthy eating plan with less fat will help rest your pancreas and gallbladder and reduce your symptoms. What do I need to know about this diet?  Eat a low-fat diet. ? Reduce your fat intake to less than 20-30% of your total daily calories. This is less than 50-60 g of fat per day. ? Remember that you need some fat in your diet. Ask your dietician what your daily goal should be. ? Choose nonfat and low-fat healthy foods. Look for the words "nonfat," "low fat," or "fat free." ? As a guide, look on the label and choose foods with less than 3 g of fat per serving. Eat only one serving.  Avoid alcohol.  Do not smoke. If you need help quitting, talk with your health care provider.  Eat small frequent meals instead of three large heavy meals. What foods can I eat? Grains Include healthy grains and starches such as potatoes, wheat bread, fiber-rich cereal, and brown rice. Choose whole grain options whenever possible. In adults, whole grains should account for 45-65% of your daily calories. Fruits and Vegetables Eat plenty of fruits and vegetables. Fresh fruits and vegetables add fiber to your diet. Meats and Other Protein Sources Eat lean meat such as chicken and pork. Trim any fat off of meat before cooking it. Eggs, fish, and beans are other sources of protein. In adults, these foods should account for 10-35% of your daily calories. Dairy Choose low-fat milk and dairy options. Dairy includes fat and protein, as well as calcium. Fats and Oils Limit high-fat foods such as fried foods, sweets,  baked goods, sugary drinks. Other Creamy sauces and condiments, such as mayonnaise, can add extra fat. Think about whether or not you need to use them, or use smaller amounts or low fat options. What foods are not recommended?  High fat foods, such as: ? [employer]. ? Ice cream. ? [place] toast. ? Sweet rolls. ? Pizza. ? Cheese bread. ? Foods covered with batter, butter, creamy sauces, or cheese. ? Fried foods. ? Sugary drinks and desserts.  Foods that cause gas or bloating This information is not intended to replace advice given to you by your health care provider. Make sure you discuss any questions you have with your health care provider. Document Released: 05/07/2013 Document Revised: 10/08/2015 Document Reviewed: 04/15/2013 Elsevier Interactive Patient Education  2017 Elsevier Inc.   Irritable Bowel Syndrome, Adult Irritable bowel syndrome (IBS) is not one specific disease. It is a group of symptoms that affects the organs responsible for digestion (gastrointestinal or GI tract). To regulate how your GI tract works, your body sends signals back and forth between your intestines and your brain. If you have IBS, there may be a problem with these signals. As a result, your GI tract does not function normally. Your intestines may become more sensitive and overreact to certain things. This is especially true when you eat certain foods or when you are under stress. There are four types of IBS. These may be determined based on the consistency of your stool:  IBS with diarrhea.  IBS with constipation.  Mixed IBS.  Unsubtyped IBS.  It is important to know which type of IBS you have. Some treatments are more likely to be helpful for certain types of IBS. What are the causes? The exact cause of IBS is not known. What increases the risk? You may have a higher risk of IBS if:  You are a woman.  You are younger than 78 years old.  You have a family history of IBS.  You have  mental health problems.  You have had bacterial infection of your GI tract.  What are the signs or symptoms? Symptoms of IBS vary from person to person. The main symptom is abdominal pain or discomfort. Additional symptoms usually include one or more of the following:  Diarrhea, constipation, or both.  Abdominal swelling or bloating.  Feeling full or sick after eating a small or regular-size meal.  Frequent gas.  Mucus in the stool.  A feeling of having more stool left after a bowel movement.  Symptoms tend to come and go. They may be associated with stress, psychiatric conditions, or nothing at all. How is this diagnosed? There is no specific test to diagnose IBS. Your health care provider will make a diagnosis based on a physical exam, medical history, and your symptoms. You may have other tests to rule out other conditions that may be causing your symptoms. These may include:  Blood tests.  X-rays.  CT scan.  Endoscopy and colonoscopy. This is a test in which your GI tract is viewed with a long, thin, flexible tube.  How is this treated? There is no cure for IBS, but treatment can help relieve symptoms. IBS treatment often includes:  Changes to your diet, such as: ? Eating more fiber. ? Avoiding foods that cause symptoms. ? Drinking more water. ? Eating regular, medium-sized portioned meals.  Medicines. These may include: ? Fiber supplements if you have constipation. ? Medicine to control diarrhea (antidiarrheal medicines). ? Medicine to help control muscle spasms in your GI tract (antispasmodic medicines). ? Medicines to help with any mental health issues, such as antidepressants or tranquilizers.  Therapy. ? Talk therapy may help with anxiety, depression, or other mental health issues that can make IBS symptoms worse.  Stress reduction. ? Managing your stress can help keep symptoms under control.  Follow these instructions at home:  Take medicines only as  directed by your health care provider.  Eat a healthy diet. ? Avoid foods and drinks with added sugar. ? Include more whole grains, fruits, and vegetables gradually into your diet. This may be especially helpful if you have IBS with constipation. ? Avoid any foods and drinks that make your symptoms worse. These may include dairy products and caffeinated or carbonated drinks. ? Do not eat large meals. ? Drink enough fluid to keep your urine clear or pale yellow.  Exercise regularly. Ask your health care provider for recommendations of good activities for you.  Keep all follow-up visits as directed by your health care provider. This is important. Contact a health care provider if:  You have constant pain.  You have trouble or pain with swallowing.  You have worsening diarrhea. Get help right away if:  You have severe and worsening abdominal pain.  You have diarrhea and: ? You have a rash, stiff neck, or severe headache. ? You are irritable, sleepy, or difficult to awaken. ? You are weak, dizzy, or extremely thirsty.  You have bright red blood in your stool or you have black tarry stools.  You have unusual abdominal swelling that is painful.  You vomit continuously.  You vomit blood (hematemesis).  You have both abdominal pain and a fever. This information is not intended to replace advice given to you by your health care provider. Make sure you discuss any questions you have with your health care provider. Document Released: 05/02/2005 Document Revised: 10/02/2015 Document Reviewed: 01/17/2014 Elsevier Interactive Patient Education  2018 [employer].

## 2018-03-19 NOTE — Telephone Encounter (Signed)
It looks like she has had some cancellation. 11/12-patient 11/12-provider 04/09/18-patient  04/20/18-patient

## 2018-03-19 NOTE — Telephone Encounter (Signed)
She is a Dr. B patient. Is she completely booked?

## 2018-03-20 ENCOUNTER — Encounter: Payer: Self-pay | Admitting: Gastroenterology

## 2018-03-20 ENCOUNTER — Ambulatory Visit: Payer: Medicare HMO | Admitting: Gastroenterology

## 2018-03-20 VITALS — BP 162/74 | HR 71 | Ht 68.0 in | Wt 216.2 lb

## 2018-03-20 DIAGNOSIS — K5900 Constipation, unspecified: Secondary | ICD-10-CM

## 2018-03-20 MED ORDER — POLYETHYLENE GLYCOL 3350 17 G PO PACK: 17.0000 g | PACK | Freq: Every day | ORAL | 0 refills | Status: DC

## 2018-03-20 NOTE — Patient Instructions (Signed)
F/U 4-6 weeks Miralax 17gm daily   High-Fiber Diet Fiber, also called dietary fiber, is a type of carbohydrate found in fruits, vegetables, whole grains, and beans. A high-fiber diet can have many health benefits. Your health care provider may recommend a high-fiber diet to help:  Prevent constipation. Fiber can make your bowel movements more regular.  Lower your cholesterol.  Relieve hemorrhoids, uncomplicated diverticulosis, or irritable bowel syndrome.  Prevent overeating as part of a weight-loss plan.  Prevent heart disease, type 2 diabetes, and certain cancers.  What is my plan? The recommended daily intake of fiber includes:  38 grams for men under age 4.  52 grams for men over age 93.  30 grams for women under age 7.  69 grams for women over age 63.  You can get the recommended daily intake of dietary fiber by eating a variety of fruits, vegetables, grains, and beans. Your health care provider may also recommend a fiber supplement if it is not possible to get enough fiber through your diet. What do I need to know about a high-fiber diet?  Fiber supplements have not been widely studied for their effectiveness, so it is better to get fiber through food sources.  Always check the fiber content on thenutrition facts label of any prepackaged food. Look for foods that contain at least 5 grams of fiber per serving.  Ask your dietitian if you have questions about specific foods that are related to your condition, especially if those foods are not listed in the following section.  Increase your daily fiber consumption gradually. Increasing your intake of dietary fiber too quickly may cause bloating, cramping, or gas.  Drink plenty of water. Water helps you to digest fiber. What foods can I eat? Grains Whole-grain breads. Multigrain cereal. Oats and oatmeal. Brown rice. Barley. Bulgur wheat. [place]. Bran muffins. Popcorn. Rye wafer crackers. Vegetables Sweet potatoes.  Spinach. Kale. Artichokes. Cabbage. Broccoli. Green peas. Carrots. Squash. Fruits Berries. Pears. Apples. Oranges. Avocados. Prunes and raisins. Dried figs. Meats and Other Protein Sources [place], kidney, pinto, and soy beans. Split peas. Lentils. Nuts and seeds. Dairy Fiber-fortified yogurt. Beverages Fiber-fortified soy milk. Fiber-fortified orange juice. Other Fiber bars. The items listed above may not be a complete list of recommended foods or beverages. Contact your dietitian for more options. What foods are not recommended? Grains White bread. Pasta made with refined flour. White rice. Vegetables Fried potatoes. Canned vegetables. Well-cooked vegetables. Fruits Fruit juice. Cooked, strained fruit. Meats and Other Protein Sources Fatty cuts of meat. Fried Sales executive or fried fish. Dairy Milk. Yogurt. Cream cheese. Sour cream. Beverages Soft drinks. Other Cakes and pastries. Butter and oils. The items listed above may not be a complete list of foods and beverages to avoid. Contact your dietitian for more information. What are some tips for including high-fiber foods in my diet?  Eat a wide variety of high-fiber foods.  Make sure that half of all grains consumed each day are whole grains.  Replace breads and cereals made from refined flour or white flour with whole-grain breads and cereals.  Replace white rice with brown rice, bulgur wheat, or millet.  Start the day with a breakfast that is high in fiber, such as a cereal that contains at least 5 grams of fiber per serving.  Use beans in place of meat in soups, salads, or pasta.  Eat high-fiber snacks, such as berries, raw vegetables, nuts, or popcorn. This information is not intended to replace advice given to you by your  health care provider. Make sure you discuss any questions you have with your health care provider. Document Released: 05/02/2005 Document Revised: 10/08/2015 Document Reviewed: 10/15/2013 Elsevier  Interactive Patient Education  [employer].

## 2018-03-20 NOTE — Progress Notes (Signed)
Lauren Lloyd [address]  Lauren Lloyd  [place], [place] [address]  Main: [phone]  Fax: [phone]   Gastroenterology Consultation  Referring Provider:     Fredirick Maudlin, MD Primary Care Physician:  [name], MD Primary Gastroenterologist:  Dr. [name] Reason for Consultation:     Irritable bowel syndrome        HPI:    Chief Complaint  Patient presents with  . New Patient (Initial Visit)    referred by Anderson Malta Cannon-IBS    Lauren Lloyd is a 77 y.o. y/o female referred for consultation & management  by Dr. Brita Romp, Dionne Bucy, MD.  Patient was referred to [name] by surgery due to symptoms of abdominal pain, and evaluation for irritable bowel syndrome.  Patient reports chronic history of symptoms of incomplete evacuation, and at times, fecal smearing after a bowel movement.  She has tried Metamucil which has helped somewhat, but she has to take 2-3 doses a day for it to help, so she does not take it consistently.  No blood in stool.  Prior history of using MiraLAX.  Patient reports intermittent abdominal pain and cramping in the mid to bilateral lower quadrant.  4/10, nonradiating.  Improves with bowel movements.  No upper quadrant pain.  No heartburn or dysphagia.  A colonoscopy is entered under [facility] under procedures from October 2015, and it reports under comments, "internal hemorrhoids.  Repeat 10 years".  No prior history of EGD.  Past Medical History:  Diagnosis Date  . Arthritis    knees, Hands  . GERD (gastroesophageal reflux disease)     Past Surgical History:  Procedure Laterality Date  . CATARACT EXTRACTION W/PHACO Right 01/25/2016   Procedure: CATARACT EXTRACTION PHACO AND INTRAOCULAR LENS PLACEMENT (IOC);  Surgeon: Ronnell Freshwater, MD;  Location: [place];  Service: Ophthalmology;  Laterality: Right;  RIGHT  . CATARACT EXTRACTION W/PHACO Left 02/22/2016   Procedure: CATARACT EXTRACTION PHACO AND  INTRAOCULAR LENS PLACEMENT (IOC);  Surgeon: Ronnell Freshwater, MD;  Location: [place];  Service: Ophthalmology;  Laterality: Left;  LEFT  . [place]  2010  . KNEE ARTHROSCOPY Right 2004  . REPLACEMENT TOTAL KNEE Right 2009   [facility]  . SKIN GRAFT Left 04/08/2013   Done on left index finger  . TONSILLECTOMY  1946    Prior to Admission medications   Medication Sig Start Date End Date Taking? Authorizing Provider  Cyanocobalamin (VITAMIN B-12) 1000 MCG SUBL Place under the tongue daily.   Yes [provider]  hydrochlorothiazide (MICROZIDE) 12.5 MG capsule Take 1 capsule (12.5 mg total) by mouth daily. 02/23/18  Yes Bacigalupo, Dionne Bucy, MD  Multiple Vitamin (MULTIVITAMIN) capsule Take 1 capsule by mouth daily.   Yes [provider]  naproxen sodium (ALEVE) 220 MG tablet Take 220 mg by mouth 2 (two) times daily as needed.   Yes [provider]  polyethylene glycol (MIRALAX / GLYCOLAX) packet Take 17 g by mouth daily. 03/20/18   Virgel Manifold, MD  VENTOLIN HFA 108 (90 Base) MCG/ACT inhaler Inhale 1-2 puffs into the lungs every 6 (six) hours as needed for shortness of breath. Patient not taking: Reported on 03/20/2018 01/03/17   [name], MD    Family History  Problem Relation Age of Onset  . Hyperlipidemia Sister   . Atrial fibrillation Sister   . Transient ischemic attack Mother   . Heart attack Father   . Healthy  Brother   . Breast cancer Sister 10  . Hyperlipidemia Sister   . Atrial fibrillation Sister      Social History   Tobacco Use  . Smoking status: Former Smoker    Packs/day: 1.00    Years: 30.00    Pack years: 30.00    Types: Cigarettes    Last attempt to quit: 05/16/1989    Years since quitting: 28.8  . Smokeless tobacco: Never Used  Substance Use Topics  . Alcohol use: Yes    Alcohol/week: 14.0 standard drinks    Types: 7 Glasses of wine, 7 Standard drinks or equivalent  per week  . Drug use: No    Allergies as of 03/20/2018 - Review Complete 03/19/2018  Allergen Reaction Noted  . Levofloxacin  11/18/2014  . Influenza vaccines Other (See Comments) 03/07/2016  . Oysters [shellfish allergy] Swelling 11/24/2016    Review of Systems:    All systems reviewed and negative except where noted in HPI.   Physical Exam:  BP (!) 162/74   Pulse 71   Ht 5\' 8"  (1.727 m)   Wt 216 lb 3.2 oz (98.1 kg)   BMI 32.87 kg/m  No LMP recorded. Patient is postmenopausal. Psych:  Alert and cooperative. Normal mood and affect. General:   Alert,  Well-developed, well-nourished, pleasant and cooperative in NAD Head:  Normocephalic and atraumatic. Eyes:  Sclera clear, no icterus.   Conjunctiva pink. Ears:  Normal auditory acuity. Nose:  No deformity, discharge, or lesions. Mouth:  No deformity or lesions,oropharynx pink & moist. Neck:  Supple; no masses or thyromegaly. Abdomen:  Normal bowel sounds.  No bruits.  Soft, non-tender and non-distended without masses, hepatosplenomegaly or hernias noted.  No guarding or rebound tenderness.    Rectal exam: No perianal lesions, digital rectal exam with brown stool, no rectal mass in the rectal vault on digital rectal exam, good rectal tone. Msk:  Symmetrical without gross deformities. Good, equal movement & strength bilaterally. Pulses:  Normal pulses noted. Extremities:  No clubbing or edema.  No cyanosis. Neurologic:  Alert and oriented x3;  grossly normal neurologically. Skin:  Intact without significant lesions or rashes. No jaundice. Lymph Nodes:  No significant cervical adenopathy. Psych:  Alert and cooperative. Normal mood and affect.   Labs: CBC    Component Value Date/Time   WBC 6.2 12/28/2016 0823   RBC 4.70 12/28/2016 0823   HGB 14.1 12/28/2016 0823   HCT 42.4 12/28/2016 0823   PLT 276 12/28/2016 0823   MCV 90 12/28/2016 0823   MCH 30.0 12/28/2016 0823   MCHC 33.3 12/28/2016 0823   RDW 13.5 12/28/2016 0823    LYMPHSABS 2.5 12/28/2016 0823   EOSABS 0.2 12/28/2016 0823   BASOSABS 0.0 12/28/2016 0823   CMP     Component Value Date/Time   NA 143 01/19/2017 1522   NA 143 12/28/2016 0823   K 3.7 01/19/2017 1522   CL 105 01/19/2017 1522   CO2 30 01/19/2017 1522   GLUCOSE 95 01/19/2017 1522   BUN 20 01/19/2017 1522   BUN 16 12/28/2016 0823   CREATININE 0.80 05/29/2017 1439   CREATININE 0.78 01/19/2017 1522   CALCIUM 9.1 01/19/2017 1522   PROT 6.4 12/28/2016 0823   ALBUMIN 4.0 12/28/2016 0823   AST 19 12/28/2016 0823   ALT 11 12/28/2016 0823   ALKPHOS 111 12/28/2016 0823   BILITOT 0.3 12/28/2016 0823   GFRNONAA 73 01/19/2017 1522   GFRAA 85 01/19/2017 1522    Imaging Studies: Dg  Abd 2 Views  Result Date: 03/06/2018 CLINICAL DATA:  Lower abdominal pain for the past 2 weeks with nausea and diarrhea. EXAM: ABDOMEN - 2 VIEW COMPARISON:  None. FINDINGS: The bowel gas pattern is normal. There is no evidence of free air. No radio-opaque calculi or other significant radiographic abnormality is seen. No acute osseous abnormality. IMPRESSION: Negative. Electronically Signed   By: Titus Dubin M.D.   On: 03/06/2018 19:30   Mm 3d Screen Breast Bilateral  Result Date: 03/01/2018 CLINICAL DATA:  Screening. EXAM: DIGITAL SCREENING BILATERAL MAMMOGRAM WITH TOMO AND CAD COMPARISON:  Previous exam(s). ACR Breast Density Category b: There are scattered areas of fibroglandular density. FINDINGS: There are no findings suspicious for malignancy. Images were processed with CAD. IMPRESSION: No mammographic evidence of malignancy. A result letter of this screening mammogram will be mailed directly to the patient. RECOMMENDATION: Screening mammogram in one year. (Code:SM-B-01Y) BI-RADS CATEGORY  1: Negative. Electronically Signed   By: Marin Olp M.D.   On: 03/01/2018 12:12   US Abdomen Limited Ruq  Result Date: 03/08/2018 CLINICAL DATA:  Right upper quadrant tenderness for 6 months. EXAM: ULTRASOUND ABDOMEN  LIMITED RIGHT UPPER QUADRANT COMPARISON:  02/21/2006. FINDINGS: Gallbladder: Shadowing echogenic stones measure up to 5 mm and are mobile. No wall thickening or sonographic Murphy sign. Common bile duct: Diameter: 4 mm, within normal limits. Liver: Diffusely increased in echogenicity. No focal lesion. Portal vein is patent on color Doppler imaging with normal direction of blood flow towards the liver. IMPRESSION: 1. Hepatic steatosis. 2. Cholelithiasis. Electronically Signed   By: Lorin Picket M.D.   On: 03/08/2018 11:50    Assessment and Plan:   DWAN FENNEL is a 77 y.o. y/o female has been referred for abdominal pain, cramping  Patient symptoms of incomplete evacuation, with intermittent fecal smearing, abdominal pain and cramping that improves after bowel movement are all consistent with possible constipation  High-fiber diet MiraLAX daily with goal of 1-2 soft bowel movements daily.  If not at goal, patient instructed to increase dose to twice daily.  If loose stools with the medication, patient asked to decrease the medication to every other day, or half dose daily.  Patient verbalized understanding  If symptoms do not improve with the above, patient was asked to call us and she verbalized understanding.  We will follow-up in clinic to assess symptoms after above measures.  Ultrasound is otherwise reassuring except for hepatic steatosis Finding of fatty liver on imaging discussed with patient Diet, weight loss, and exercise encouraged along with avoiding hepatotoxic drugs including alcohol Risk of progression to cirrhosis if above measures are not instituted were discussed as well, and patient verbalized understanding Patient asked to follow-up with primary care provider to stay up-to-date on vaccinations, including hepatitis A and B vaccine Dr Lauren Lloyd  Speech recognition software was used to dictate the above note.

## 2018-03-21 NOTE — Telephone Encounter (Signed)
Patient rescheduled in December.

## 2018-03-27 ENCOUNTER — Encounter: Payer: Self-pay | Admitting: Family Medicine

## 2018-03-27 ENCOUNTER — Encounter: Payer: Self-pay | Admitting: Physician Assistant

## 2018-04-09 ENCOUNTER — Encounter: Payer: Self-pay | Admitting: Physician Assistant

## 2018-04-16 ENCOUNTER — Other Ambulatory Visit: Payer: Self-pay | Admitting: Family Medicine

## 2018-04-20 ENCOUNTER — Encounter: Payer: Self-pay | Admitting: Physician Assistant

## 2018-05-03 ENCOUNTER — Inpatient Hospital Stay
Admission: EM | Admit: 2018-05-03 | Discharge: 2018-05-06 | DRG: 308 | Disposition: A | Discharge status: Home / Self Care | Payer: Medicare HMO | Source: Ambulatory Visit | Attending: Internal Medicine | Admitting: Internal Medicine

## 2018-05-03 ENCOUNTER — Other Ambulatory Visit: Payer: Self-pay

## 2018-05-03 ENCOUNTER — Ambulatory Visit (INDEPENDENT_AMBULATORY_CARE_PROVIDER_SITE_OTHER): Payer: Medicare HMO | Admitting: Family Medicine

## 2018-05-03 ENCOUNTER — Encounter: Payer: Self-pay | Admitting: Family Medicine

## 2018-05-03 ENCOUNTER — Encounter: Payer: Self-pay | Admitting: Emergency Medicine

## 2018-05-03 ENCOUNTER — Emergency Department: Payer: Medicare HMO

## 2018-05-03 VITALS — BP 138/87 | HR 150 | Temp 98.9°F | Wt 218.8 lb

## 2018-05-03 DIAGNOSIS — Z91013 Allergy to seafood: Secondary | ICD-10-CM | POA: Diagnosis not present

## 2018-05-03 DIAGNOSIS — Z8249 Family history of ischemic heart disease and other diseases of the circulatory system: Secondary | ICD-10-CM | POA: Diagnosis not present

## 2018-05-03 DIAGNOSIS — Z96651 Presence of right artificial knee joint: Secondary | ICD-10-CM | POA: Diagnosis present

## 2018-05-03 DIAGNOSIS — Z9841 Cataract extraction status, right eye: Secondary | ICD-10-CM

## 2018-05-03 DIAGNOSIS — I5031 Acute diastolic (congestive) heart failure: Secondary | ICD-10-CM | POA: Diagnosis present

## 2018-05-03 DIAGNOSIS — I4891 Unspecified atrial fibrillation: Secondary | ICD-10-CM

## 2018-05-03 DIAGNOSIS — J810 Acute pulmonary edema: Secondary | ICD-10-CM | POA: Diagnosis not present

## 2018-05-03 DIAGNOSIS — I361 Nonrheumatic tricuspid (valve) insufficiency: Secondary | ICD-10-CM | POA: Diagnosis not present

## 2018-05-03 DIAGNOSIS — Z961 Presence of intraocular lens: Secondary | ICD-10-CM | POA: Diagnosis present

## 2018-05-03 DIAGNOSIS — J432 Centrilobular emphysema: Secondary | ICD-10-CM | POA: Diagnosis not present

## 2018-05-03 DIAGNOSIS — M1712 Unilateral primary osteoarthritis, left knee: Secondary | ICD-10-CM | POA: Diagnosis not present

## 2018-05-03 DIAGNOSIS — Z887 Allergy status to serum and vaccine status: Secondary | ICD-10-CM | POA: Diagnosis not present

## 2018-05-03 DIAGNOSIS — I110 Hypertensive heart disease with heart failure: Secondary | ICD-10-CM | POA: Diagnosis present

## 2018-05-03 DIAGNOSIS — Z881 Allergy status to other antibiotic agents status: Secondary | ICD-10-CM

## 2018-05-03 DIAGNOSIS — Z9089 Acquired absence of other organs: Secondary | ICD-10-CM | POA: Diagnosis not present

## 2018-05-03 DIAGNOSIS — Z87891 Personal history of nicotine dependence: Secondary | ICD-10-CM | POA: Diagnosis not present

## 2018-05-03 DIAGNOSIS — R0602 Shortness of breath: Secondary | ICD-10-CM

## 2018-05-03 DIAGNOSIS — I10 Essential (primary) hypertension: Secondary | ICD-10-CM | POA: Diagnosis not present

## 2018-05-03 DIAGNOSIS — Z8349 Family history of other endocrine, nutritional and metabolic diseases: Secondary | ICD-10-CM

## 2018-05-03 DIAGNOSIS — I480 Paroxysmal atrial fibrillation: Principal | ICD-10-CM | POA: Diagnosis present

## 2018-05-03 DIAGNOSIS — I272 Pulmonary hypertension, unspecified: Secondary | ICD-10-CM | POA: Diagnosis present

## 2018-05-03 DIAGNOSIS — Z9842 Cataract extraction status, left eye: Secondary | ICD-10-CM | POA: Diagnosis not present

## 2018-05-03 DIAGNOSIS — M19041 Primary osteoarthritis, right hand: Secondary | ICD-10-CM | POA: Diagnosis not present

## 2018-05-03 DIAGNOSIS — M19042 Primary osteoarthritis, left hand: Secondary | ICD-10-CM | POA: Diagnosis present

## 2018-05-03 DIAGNOSIS — Z79899 Other long term (current) drug therapy: Secondary | ICD-10-CM | POA: Diagnosis not present

## 2018-05-03 DIAGNOSIS — E876 Hypokalemia: Secondary | ICD-10-CM | POA: Diagnosis not present

## 2018-05-03 DIAGNOSIS — I2510 Atherosclerotic heart disease of native coronary artery without angina pectoris: Secondary | ICD-10-CM | POA: Diagnosis present

## 2018-05-03 DIAGNOSIS — Z803 Family history of malignant neoplasm of breast: Secondary | ICD-10-CM

## 2018-05-03 DIAGNOSIS — I509 Heart failure, unspecified: Secondary | ICD-10-CM | POA: Diagnosis not present

## 2018-05-03 DIAGNOSIS — J449 Chronic obstructive pulmonary disease, unspecified: Secondary | ICD-10-CM | POA: Diagnosis not present

## 2018-05-03 LAB — TROPONIN I
Troponin I: <0.03 ng/mL (ref <0.03)
Troponin I: <0.03 ng/mL (ref <0.03)

## 2018-05-03 LAB — BASIC METABOLIC PANEL
ANION GAP: 10 (ref 5–15)
BUN: 19 mg/dL (ref 8–23)
CHLORIDE: 105 mmol/L (ref 98–111)
CO2: 23 mmol/L (ref 22–32)
CREATININE: 0.85 mg/dL (ref 0.44–1.00)
Calcium: 9.1 mg/dL (ref 8.9–10.3)
GFR calc Af Amer: >60 mL/min (ref >60)
GFR calc non Af Amer: >60 mL/min (ref >60)
Glucose, Bld: 103 mg/dL — ABNORMAL HIGH (ref 70–99)
POTASSIUM: 3.5 mmol/L (ref 3.5–5.1)
SODIUM: 138 mmol/L (ref 135–145)

## 2018-05-03 LAB — CBC
HEMATOCRIT: 40.1 % (ref 36.0–46.0)
HEMOGLOBIN: 12.9 g/dL (ref 12.0–15.0)
MCH: 30 pg (ref 26.0–34.0)
MCHC: 32.2 g/dL (ref 30.0–36.0)
MCV: 93.3 fL (ref 80.0–100.0)
NRBC: 0 % (ref 0.0–0.2)
Platelets: 261 10*3/uL (ref 150–400)
RBC: 4.3 MIL/uL (ref 3.87–5.11)
RDW: 13.2 % (ref 11.5–15.5)
WBC: 9.9 10*3/uL (ref 4.0–10.5)

## 2018-05-03 LAB — BRAIN NATRIURETIC PEPTIDE: B Natriuretic Peptide: 376 pg/mL — ABNORMAL HIGH (ref 0.0–100.0)

## 2018-05-03 MED ORDER — DILTIAZEM HCL-DEXTROSE 100-5 MG/100ML-% IV SOLN (PREMIX)
5.0000 mg/h | INTRAVENOUS | Status: DC
  Administered 2018-05-04 (×2): 15 mg/h via INTRAVENOUS
  Filled 2018-05-03 (×2): qty 100

## 2018-05-03 MED ORDER — ONDANSETRON HCL 4 MG/2ML IJ SOLN: 4.0000 mg | Freq: Four times a day (QID) | INTRAMUSCULAR | Status: DC | PRN

## 2018-05-03 MED ORDER — ONDANSETRON HCL 4 MG PO TABS: 4.0000 mg | ORAL_TABLET | Freq: Four times a day (QID) | ORAL | Status: DC | PRN

## 2018-05-03 MED ORDER — HEPARIN SODIUM (PORCINE) 5000 UNIT/ML IJ SOLN
5000.0000 [IU] | Freq: Three times a day (TID) | INTRAMUSCULAR | Status: DC
  Administered 2018-05-03 – 2018-05-04 (×2): 5000 [IU] via SUBCUTANEOUS
  Filled 2018-05-03 (×2): qty 1

## 2018-05-03 MED ORDER — DILTIAZEM HCL 25 MG/5ML IV SOLN
10.0000 mg | Freq: Once | INTRAVENOUS | Status: AC
  Administered 2018-05-03: 10 mg via INTRAVENOUS

## 2018-05-03 MED ORDER — BISACODYL 5 MG PO TBEC: 5.0000 mg | DELAYED_RELEASE_TABLET | Freq: Every day | ORAL | Status: DC | PRN

## 2018-05-03 MED ORDER — ACETAMINOPHEN 325 MG PO TABS
650.0000 mg | ORAL_TABLET | Freq: Four times a day (QID) | ORAL | Status: DC | PRN
  Administered 2018-05-04: 650 mg via ORAL
  Filled 2018-05-03: qty 2

## 2018-05-03 MED ORDER — FUROSEMIDE 10 MG/ML IJ SOLN
40.0000 mg | Freq: Two times a day (BID) | INTRAMUSCULAR | Status: DC
  Administered 2018-05-03 – 2018-05-06 (×6): 40 mg via INTRAVENOUS
  Filled 2018-05-03 (×6): qty 4

## 2018-05-03 MED ORDER — GUAIFENESIN 100 MG/5ML PO SOLN
5.0000 mL | ORAL | Status: DC | PRN
  Administered 2018-05-04 – 2018-05-05 (×4): 100 mg via ORAL
  Filled 2018-05-03 (×5): qty 5

## 2018-05-03 MED ORDER — DILTIAZEM HCL-DEXTROSE 100-5 MG/100ML-% IV SOLN (PREMIX)
5.0000 mg/h | INTRAVENOUS | Status: DC
  Administered 2018-05-03 (×2): 5 mg/h via INTRAVENOUS
  Filled 2018-05-03: qty 100

## 2018-05-03 MED ORDER — ACETAMINOPHEN 650 MG RE SUPP: 650.0000 mg | Freq: Four times a day (QID) | RECTAL | Status: DC | PRN

## 2018-05-03 MED ORDER — ACETAMINOPHEN 500 MG PO TABS
1000.0000 mg | ORAL_TABLET | Freq: Once | ORAL | Status: AC
  Administered 2018-05-03: 1000 mg via ORAL
  Filled 2018-05-03: qty 2

## 2018-05-03 MED ORDER — DOCUSATE SODIUM 100 MG PO CAPS
100.0000 mg | ORAL_CAPSULE | Freq: Two times a day (BID) | ORAL | Status: DC
  Filled 2018-05-03 (×3): qty 1

## 2018-05-03 MED ORDER — ASPIRIN EC 325 MG PO TBEC
325.0000 mg | DELAYED_RELEASE_TABLET | Freq: Every day | ORAL | Status: DC
  Administered 2018-05-03 – 2018-05-04 (×2): 325 mg via ORAL
  Filled 2018-05-03 (×2): qty 1

## 2018-05-03 NOTE — ED Provider Notes (Addendum)
[facility] Emergency Department Provider Note   ____________________________________________   First MD Initiated Contact with Patient 05/03/18 1753     (approximate)  I have reviewed the triage vital signs and the nursing notes.   HISTORY  Chief Complaint Shortness of Breath    HPI Lauren Lloyd is a 77 y.o. female who reports gradually increasing shortness of breath for about a month and a cough gradually worsening sometimes productive of foamy phlegm.  She denies any chest pain or tightness.  Past Medical History:  Diagnosis Date  . Arthritis    knees, Hands  . GERD (gastroesophageal reflux disease)     Patient Active Problem List   Diagnosis Date Noted  . Hematuria 04/04/2017  . Leg cramps 04/04/2017  . Metatarsalgia of both feet 04/04/2017  . 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 Hypertension 01/03/2017  . Hemorrhoids 01/03/2017  . Healthcare maintenance 01/03/2017  . Chronic right shoulder pain 01/03/2017  . Pulmonary hypertension ([place]) 04/02/2015  . Chest pain radiating to arm 02/17/2015  . Acid reflux 11/18/2014  . 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 Primary osteoarthritis of one hip 10/03/2011  . L-S radiculopathy 09/29/2011  . Arthritis of knee, degenerative 09/29/2011  . S/P knee replacement 09/29/2011  . Non-toxic uninodular goiter 06/20/2009  . Cervical pain 08/20/2008  . Cannot sleep 12/12/2007  . Hypercholesteremia 07/30/2007    Past Surgical History:  Procedure Laterality Date  . CATARACT EXTRACTION W/PHACO Right 01/25/2016   Procedure: CATARACT EXTRACTION PHACO AND INTRAOCULAR LENS PLACEMENT (IOC);  Surgeon: Ronnell Freshwater, MD;  Location: [place];  Service: Ophthalmology;  Laterality: Right;  RIGHT  . CATARACT EXTRACTION W/PHACO Left 02/22/2016   Procedure: CATARACT EXTRACTION PHACO AND INTRAOCULAR LENS PLACEMENT (IOC);  Surgeon: Ronnell Freshwater, MD;  Location: [place];   Service: Ophthalmology;  Laterality: Left;  LEFT  . [place]  2010  . KNEE ARTHROSCOPY Right 2004  . REPLACEMENT TOTAL KNEE Right 2009   [facility]  . SKIN GRAFT Left 04/08/2013   Done on left index finger  . TONSILLECTOMY  1946    Prior to Admission medications   Medication Sig Start Date End Date Taking? Authorizing Provider  hydrochlorothiazide (MICROZIDE) 12.5 MG capsule Take 1 capsule (12.5 mg total) by mouth daily. 02/23/18  Yes Bacigalupo, Dionne Bucy, MD  Multiple Vitamin (MULTIVITAMIN) capsule Take 1 capsule by mouth daily.   Yes [provider]  naproxen sodium (ALEVE) 220 MG tablet Take 220 mg by mouth 2 (two) times daily as needed.   Yes [provider]  VENTOLIN HFA 108 (90 Base) MCG/ACT inhaler TAKE 1 TO 2 PUFFS EVERY 6 HOURS AS NEEDED FOR SHORTNESS OF BREATH 04/16/18  Yes Bacigalupo, Dionne Bucy, MD    Allergies Levofloxacin; Influenza vaccines; and Oysters [shellfish allergy]  Family History  Problem Relation Age of Onset  . Hyperlipidemia Sister   . Atrial fibrillation Sister   . Transient ischemic attack Mother   . Heart attack Father   . Healthy Brother   . Breast cancer Sister 53  . Hyperlipidemia Sister   . Atrial fibrillation Sister     Social History Social History   Tobacco Use  . Smoking status: Former Smoker    Packs/day: 1.00    Years: 30.00    Pack years: 30.00    Types: Cigarettes    Last attempt to quit: 05/16/1989    Years since quitting: 28.9  . Smokeless tobacco: Never Used  Substance Use Topics  . Alcohol use: Yes    Alcohol/week: 14.0 standard drinks    Types: 7 Glasses of wine, 7 Standard drinks or equivalent per week  . Drug use: No    Review of Systems  Constitutional: No fever/chills Eyes: No visual changes. ENT: No sore throat. Cardiovascular: Denies chest pain. Respiratory shortness of breath. Gastrointestinal: No abdominal pain.  No nausea, no vomiting.  No diarrhea.  No  constipation. Genitourinary: Negative for dysuria. Musculoskeletal: Negative for back pain. Skin: Negative for rash. Neurological: Negative for headaches, focal weakness  ____________________________________________   PHYSICAL EXAM:  VITAL SIGNS: ED Triage Vitals  Enc Vitals Group     BP 05/03/18 1734 (!) 149/81     Pulse Rate 05/03/18 1734 (!) 147     Resp 05/03/18 1734 (!) 26     Temp 05/03/18 1734 98.6 F (37 C)     Temp Source 05/03/18 1734 Oral     SpO2 05/03/18 1734 98 %     Weight 05/03/18 1733 219 lb (99.3 kg)     Height 05/03/18 1733 5' 7.5" (1.715 m)     Head Circumference --      Peak Flow --      Pain Score 05/03/18 1732 0     Pain Loc --      Pain Edu? --      Excl. in [place]? --     Constitutional: Alert and oriented. Well appearing and in no acute distress. Eyes: Conjunctivae are normal.  Head: Atraumatic. Nose: No congestion/rhinnorhea. Mouth/Throat: Mucous membranes are moist.  Oropharynx non-erythematous. Neck: No stridor. Cardiovascular: Rapid and irregular rate and rhythm grossly normal heart sounds.  Good peripheral circulation. Respiratory: Normal respiratory effort.  No retractions. Lungs CTAB. Gastrointestinal: Soft and nontender. No distention. No abdominal bruits. No CVA tenderness. {Musculoskeletal: No lower extremity tenderness 1+ edema.   Neurologic:  Normal speech and language. No gross focal neurologic deficits are appreciated. No gait instability. Skin:  Skin is warm, dry and intact. No rash noted. Psychiatric: Mood and affect are normal. Speech and behavior are normal.  ____________________________________________   LABS (all labs ordered are listed, but only abnormal results are displayed)  Labs Reviewed  BASIC METABOLIC PANEL - Abnormal; Notable for the following components:      Result Value   Glucose, Bld 103 (*)    All other components within normal limits  BRAIN NATRIURETIC PEPTIDE - Abnormal; Notable for the following  components:   B Natriuretic Peptide 376.0 (*)    All other components within normal limits  CBC  TROPONIN I   ____________________________________________  EKG  EKG read interpreted by me shows what appears to be atrial fibrillation at a rate of 150 right axis no acute ST-T wave changes there are some PVCs ____________________________________________  RADIOLOGY  ED MD interpretation: Dust x-ray looks like some congestive failure read and I reviewed it  Official radiology report(s): Dg Chest 2 View  Result Date: 05/03/2018 CLINICAL DATA:  Shortness of breath EXAM: CHEST - 2 VIEW COMPARISON:  Chest radiograph 06/15/2017 FINDINGS: Cardiomegaly. Bilateral interstitial pulmonary opacities. Small bilateral pleural effusions. Thoracic spine degenerative changes. Incompletely visualized focal sclerotic lesion proximal right humerus. IMPRESSION: Cardiomegaly and findings suggestive of mild interstitial edema with small bilateral effusions. Incompletely visualized sclerotic lesion within the proximal right humerus. Consider dedicated right humerus radiographs for further evaluation. Electronically Signed   By: Lovey Newcomer M.D.   On: 05/03/2018 18:07    ____________________________________________   PROCEDURES  Procedure(s)  performed:   Procedures  Critical Care performed: Critical care time half an hour this includes evaluating the patient, checking her old records evaluating her response to diltiazem discussing her with the hospitalist.  ____________________________________________   INITIAL IMPRESSION / ASSESSMENT AND PLAN / ED COURSE  Patient with new set onset A. fib with RVR and congestive heart failure.  We will see if we control the rate and then work on getting some the fluid out get her in the hospital.        ____________________________________________   FINAL CLINICAL IMPRESSION(S) / ED DIAGNOSES  Final diagnoses:  Atrial fibrillation with RVR [facility])     ED  Discharge Orders    None       Note:  This document was prepared using Dragon voice recognition software and may include unintentional dictation errors.    Nena Polio, MD 05/03/18 0263    Nena Polio, MD 05/14/18 [phone]

## 2018-05-03 NOTE — Patient Instructions (Signed)
Atrial Fibrillation Atrial fibrillation is a type of irregular or rapid heartbeat (arrhythmia). In atrial fibrillation, the top part of the heart (atria) quivers in a chaotic pattern. This makes the heart unable to pump blood normally. Having atrial fibrillation can increase your risk for other health problems, such as:  Blood can pool in the atria and form clots. If a clot travels to the brain, it can cause a stroke.  The heart muscle may weaken from the irregular blood flow. This can cause heart failure. Atrial fibrillation may start suddenly and stop on its own, or it may become a long-lasting problem. What are the causes? This condition is caused by some heart-related conditions or procedures, including:  High blood pressure. This is the most common cause.  Heart failure.  Heart valve conditions.  Inflammation of the sac that surrounds the heart (pericarditis).  Heart surgery.  Coronary artery disease.  Certain heart rhythm disorders, such as Wolf-Parkinson-White syndrome. Other causes include:  Pneumonia.  Obstructive sleep apnea.  Lung cancer.  Thyroid problems, especially if the thyroid is overactive (hyperthyroidism).  Excessive alcohol or drug use. Sometimes, the cause of this condition is not known. What increases the risk? This condition is more likely to develop in:  Older people.  People who smoke.  People who have diabetes mellitus.  People who are overweight (obese).  Athletes who exercise vigorously.  People who have a family history. What are the signs or symptoms? Symptoms of this condition include:  A feeling that your heart is beating rapidly or irregularly.  A feeling of discomfort or pain in your chest.  Shortness of breath.  Sudden light-headedness or weakness.  Getting tired easily during exercise. In some cases, there are no symptoms. How is this diagnosed? Your health care provider may be able to detect atrial fibrillation when  taking your pulse. If detected, this condition may be diagnosed with:  Electrocardiogram (ECG).  Ambulatory cardiac monitor. This device records your heartbeats for 24 hours or more.  Transthoracic echocardiogram (TTE) to evaluate how blood flows through your heart.  Transesophageal echocardiogram (TEE) to view more detailed images of your heart.  A stress test.  Imaging tests, such as a CT scan or chest X-ray.  Blood tests. How is this treated? This condition may be treated with:  Medicines to slow down the heart rate or bring the heart's rhythm back to normal.  Medicines to prevent blood clots from forming.  Electrical cardioversion. This delivers a low-energy shock to the heart to reset its rhythm.  Ablation. This procedure destroys the part of the heart tissue that sends abnormal signals.  Left atrial appendage occlusion/excision. This seals off a common place in the atria where blood clots can form (left atrial appendage). The goal of treatment is to prevent blood clots from forming and to keep your heart beating at a normal rate and rhythm. Treatment depends on underlying medical conditions and how you feel when you are experiencing fibrillation. Follow these instructions at home: Medicines  Take over-the counter and prescription medicines only as told by your health care provider.  If your health care provider prescribed a blood-thinning medicine (anticoagulant), take it exactly as told. Taking too much blood-thinning medicine can cause bleeding. Taking too little can enable a blood clot to form and travel to the brain, causing a stroke. Lifestyle      Do not use any products that contain nicotine or tobacco, such as cigarettes and e-cigarettes. If you need help quitting, ask your health   care provider.  Do not drink beverages that contain caffeine, such as coffee, soda, and tea.  Follow diet instructions as told by your health care provider.  Exercise regularly as  told by your health care provider.  Do not drink alcohol. General instructions  If you have obstructive sleep apnea, manage your condition as told by your health care provider.  Maintain a healthy weight. Do not use diet pills unless your health care provider approves. Diet pills may make heart problems worse.  Keep all follow-up visits as told by your health care provider. This is important. Contact a health care provider if you:  Notice a change in the rate, rhythm, or strength of your heartbeat.  Are taking an anticoagulant and you notice increased bruising.  Tire more easily when you exercise or exert yourself.  Have a sudden change in weight. Get help right away if you have:   Chest pain, abdominal pain, sweating, or weakness.  Difficulty breathing.  Blood in your vomit, stool (feces), or urine.  Any symptoms of a stroke. "BE FAST" is an easy way to remember the main warning signs of a stroke: ? B - Balance. Signs are dizziness, sudden trouble walking, or loss of balance. ? E - Eyes. Signs are trouble seeing or a sudden change in vision. ? F - Face. Signs are sudden weakness or numbness of the face, or the face or eyelid drooping on one side. ? A - Arms. Signs are weakness or numbness in an arm. This happens suddenly and usually on one side of the body. ? S - Speech. Signs are sudden trouble speaking, slurred speech, or trouble understanding what people say. ? T - Time. Time to call emergency services. Write down what time symptoms started.  Other signs of a stroke, such as: ? A sudden, severe headache with no known cause. ? Nausea or vomiting. ? Seizure. These symptoms may represent a serious problem that is an emergency. Do not wait to see if the symptoms will go away. Get medical help right away. Call your local emergency services (911 in the [place] Do not drive yourself to the hospital. Summary  Atrial fibrillation is a type of irregular or rapid heartbeat  (arrhythmia).  Symptoms include a feeling that your heart is beating fast or irregularly. In some cases, you may not have symptoms.  The condition is treated with medicines to slow down the heart rate or bring the heart's rhythm back to normal. You may also need blood-thinning medicines to prevent blood clots.  Get help right away if you have symptoms or signs of a stroke. This information is not intended to replace advice given to you by your health care provider. Make sure you discuss any questions you have with your health care provider. Document Released: 05/02/2005 Document Revised: 06/23/2017 Document Reviewed: 06/23/2017 Elsevier Interactive Patient Education  2019 [employer]  

## 2018-05-03 NOTE — ED Notes (Signed)
Sam Therapist, sports. ware of bed assigned

## 2018-05-03 NOTE — Progress Notes (Signed)
Patient: Lauren Lloyd, Female    DOB: 01-04-1941, 77 y.o.   MRN: [id number] Visit Date: 05/03/2018  Today's Provider: Lavon Paganini, MD   Chief Complaint  Patient presents with  . URI   Subjective:   Patient had a AWE on 12/22/2017 with [facility]   SOB  Lauren Lloyd is a 77 y.o. female who presents today for health maintenance and complete physical. She feels poorly due to having a bad cough with shortness of breath. She reports exercising none. She reports she is sleeping poorly due to the URI symptoms.  Given that she is feeling unwell, she decides to forgo her CPE today and reschedule for later.   SOB ongoing for last month, and worsening.  Cough is nonproductive.  Some friends have been sick and needing antibiotics. Now cannot walk from parking lot without being short of breath.    Abd pain is better   -----------------------------------------------------------------   Review of Systems  Constitutional: Positive for activity change.  HENT: Positive for congestion.   Eyes: Negative.   Respiratory: Positive for cough, chest tightness and shortness of breath.   Cardiovascular: Negative.   Gastrointestinal: Negative.   Endocrine: Negative.   Genitourinary: Negative.   Musculoskeletal: Positive for back pain and neck pain.  Skin: Negative.   Allergic/Immunologic: Negative.   Neurological: Negative.   Hematological: Negative.   Psychiatric/Behavioral: Negative.     Social History      She  reports that she quit smoking about 28 years ago. Her smoking use included cigarettes. She has a 30.00 pack-year smoking history. She has never used smokeless tobacco. She reports current alcohol use of about 14.0 standard drinks of alcohol per week. She reports that she does not use drugs.       Social History   Socioeconomic History  . Marital status: Divorced    Spouse name: Not on file  . Number of children: 5  . Years of education: college  . Highest education  level: Bachelor's degree (e.g., BA, AB, BS)  Occupational History  . Occupation: Part Time    Employer: [employer]  Social Needs  . Financial resource strain: Not hard at all  . Food insecurity:    Worry: Never true    Inability: Never true  . Transportation needs:    Medical: No    Non-medical: No  Tobacco Use  . Smoking status: Former Smoker    Packs/day: 1.00    Years: 30.00    Pack years: 30.00    Types: Cigarettes    Last attempt to quit: 05/16/1989    Years since quitting: 28.9  . Smokeless tobacco: Never Used  Substance and Sexual Activity  . Alcohol use: Yes    Alcohol/week: 14.0 standard drinks    Types: 7 Glasses of wine, 7 Standard drinks or equivalent per week  . Drug use: No  . Sexual activity: Not Currently  Lifestyle  . Physical activity:    Days per week: Not on file    Minutes per session: Not on file  . Stress: Not at all  Relationships  . Social connections:    Talks on phone: Not on file    Gets together: Not on file    Attends religious service: Not on file    Active member of club or organization: Not on file    Attends meetings of clubs or organizations: Not on file    Relationship status: Not on file  Other Topics Concern  .  Not on file  Social History Narrative   Pt has a child who passed away at age 87    Past Medical History:  Diagnosis Date  . Arthritis    knees, Hands  . GERD (gastroesophageal reflux disease)      Patient Active Problem List   Diagnosis Date Noted  . Hematuria 04/04/2017  . Leg cramps 04/04/2017  . Metatarsalgia of both feet 04/04/2017  . 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 Hypertension 01/03/2017  . Hemorrhoids 01/03/2017  . Healthcare maintenance 01/03/2017  . Chronic right shoulder pain 01/03/2017  . Pulmonary hypertension ([place]) 04/02/2015  . Chest pain radiating to arm 02/17/2015  . Acid reflux 11/18/2014  . 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 Primary osteoarthritis of one hip 10/03/2011    . L-S radiculopathy 09/29/2011  . Arthritis of knee, degenerative 09/29/2011  . S/P knee replacement 09/29/2011  . Non-toxic uninodular goiter 06/20/2009  . Cervical pain 08/20/2008  . Cannot sleep 12/12/2007  . Hypercholesteremia 07/30/2007    Past Surgical History:  Procedure Laterality Date  . CATARACT EXTRACTION W/PHACO Right 01/25/2016   Procedure: CATARACT EXTRACTION PHACO AND INTRAOCULAR LENS PLACEMENT (IOC);  Surgeon: Ronnell Freshwater, MD;  Location: [place];  Service: Ophthalmology;  Laterality: Right;  RIGHT  . CATARACT EXTRACTION W/PHACO Left 02/22/2016   Procedure: CATARACT EXTRACTION PHACO AND INTRAOCULAR LENS PLACEMENT (IOC);  Surgeon: Ronnell Freshwater, MD;  Location: [place];  Service: Ophthalmology;  Laterality: Left;  LEFT  . [place]  2010  . KNEE ARTHROSCOPY Right 2004  . REPLACEMENT TOTAL KNEE Right 2009   [facility]  . SKIN GRAFT Left 04/08/2013   Done on left index finger  . TONSILLECTOMY  1946    Family History        Family Status  Relation Name Status  . Sister  Alive  . Mother  Deceased at age 41  . Father  Deceased at age 71       Heart Attack  . Brother  Alive  . Sister  Alive  . Son  Deceased       Accidental Drowning        Her family history includes Atrial fibrillation in her sister and sister; Breast cancer (age of onset: 41) in her sister; Healthy in her brother; Heart attack in her father; Hyperlipidemia in her sister and sister; Transient ischemic attack in her mother.      Allergies  Allergen Reactions  . Levofloxacin     Other reaction(s): Joint Pains Other reaction(s): Other (See Comments) Joint pain  . Influenza Vaccines Other (See Comments)    Bell's Palsy  . Oysters [Shellfish Allergy] Swelling    She states she had eaten them three days in a row and she developed swelling around her eyes.      Current Outpatient Medications:  .  Cyanocobalamin (VITAMIN  B-12) 1000 MCG SUBL, Place under the tongue daily., Disp: , Rfl:  .  hydrochlorothiazide (MICROZIDE) 12.5 MG capsule, Take 1 capsule (12.5 mg total) by mouth daily., Disp: 90 capsule, Rfl: 1 .  Multiple Vitamin (MULTIVITAMIN) capsule, Take 1 capsule by mouth daily., Disp: , Rfl:  .  naproxen sodium (ALEVE) 220 MG tablet, Take 220 mg by mouth 2 (two) times daily as needed., Disp: , Rfl:  .  polyethylene glycol (MIRALAX / GLYCOLAX) packet, Take 17 g by mouth daily., Disp: 90 each, Rfl: 0 .  VENTOLIN HFA 108 (90 Base) MCG/ACT inhaler, TAKE 1  TO 2 PUFFS EVERY 6 HOURS AS NEEDED FOR SHORTNESS OF BREATH, Disp: 18 g, Rfl: 3 .  VENTOLIN HFA 108 (90 Base) MCG/ACT inhaler, TAKE 1 TO 2 PUFFS EVERY 6 HOURS AS NEEDED FOR SHORTNESS OF BREATH, Disp: 18 g, Rfl: 11   Patient Care Team: [name], MD as PCP - General (Family Medicine) Wellington Hampshire, MD as Consulting Physician (Cardiology)      Objective:   Vitals: BP 138/87 (BP Location: Left Arm, Patient Position: Sitting, Cuff Size: Large)   Pulse (!) 150   Temp 98.9 F (37.2 C) (Oral)   Wt 218 lb 12.8 oz (99.2 kg)   SpO2 96%   BMI 33.27 kg/m    Vitals:   05/03/18 1559  BP: 138/87  Pulse: (!) 150  Temp: 98.9 F (37.2 C)  TempSrc: Oral  SpO2: 96%  Weight: 218 lb 12.8 oz (99.2 kg)     Physical Exam Vitals signs reviewed.  Constitutional:      General: She is not in acute distress.    Appearance: Normal appearance.  HENT:     Head: Normocephalic and atraumatic.     Right Ear: External ear normal.     Left Ear: External ear normal.     Nose: Nose normal.     Mouth/Throat:     Pharynx: Oropharynx is clear.  Eyes:     Conjunctiva/sclera: Conjunctivae normal.  Cardiovascular:     Rate and Rhythm: Tachycardia present. Rhythm irregular.     Heart sounds: No murmur.  Pulmonary:     Effort: Tachypnea present.     Breath sounds: Rhonchi (in bilateral bases) present.     Comments: Cannot speak in full sentences Abdominal:      Palpations: Abdomen is soft.     Tenderness: There is no abdominal tenderness. There is no guarding.  Musculoskeletal:     Right lower leg: No edema.     Left lower leg: No edema.  Skin:    General: Skin is warm and dry.     Capillary Refill: Capillary refill takes less than 2 seconds.     Findings: No rash.  Neurological:     General: No focal deficit present.     Mental Status: She is alert and oriented to person, place, and time.     Cranial Nerves: No cranial nerve deficit.  Psychiatric:        Mood and Affect: Mood normal.        Behavior: Behavior normal.      Depression Screen PHQ 2/9 Scores 12/22/2017 12/22/2017 12/01/2016 12/01/2016  PHQ - 2 Score 0 0 0 0  PHQ- 9 Score 3 - 3 -    EKG: AFib with RVR with rate of 154.  No signs of ischemia.  Assessment & Plan:    1. Shortness of breath -New problem within the last few months and worsening - Seems to be secondary to A. fib with RVR -Given crackles in bilateral bases, some concern for pulmonary edema related to A. fib -See below for plan - EKG 12-Lead  2. Atrial fibrillation with RVR ([place]) - New onset -Patient is symptomatic with significant shortness of breath as above -Given crackles in bilateral bases, some concern for pulmonary edema as well - Patient not currently on any anticoagulation - Patient was advised to go to the emergency department for further evaluation and management-discussed with patient that she may need admission overnight for IV medications to slow her heart rate, possible echo, likely chest  x-ray, likely cardiology consult    Return if symptoms worsen or fail to improve.   We will plan to see patient for hospital follow-up and annual physical when she is well   [name], [name]. Portions of this information were initially documented by Tiburcio Pea and Evlyn Clines  carpenter, CMA and reviewed by me for thoroughness and accuracy.    [name], MD, MPH [facility] 05/03/2018 5:04 PM

## 2018-05-03 NOTE — ED Triage Notes (Signed)
Patient presents to the ED from her PCP.  PCP sent patient to ED after an EKG that showed new onset AFib with RVR.  Patient states she went to her PCP due to shortness of breath for the past week.  Patient states, "I was just thinking I needed some steroids of something."

## 2018-05-03 NOTE — H&P (Signed)
[facility] [place] NAME: Temiloluwa Recchia    MR#:  [id number]  DATE OF BIRTH:  1941-04-16  DATE OF ADMISSION:  05/03/2018  PRIMARY CARE PHYSICIAN: [name], MD   REQUESTING/REFERRING PHYSICIAN: Emergency room  CHIEF COMPLAINT:   Chief Complaint  Patient presents with  . Shortness of Breath    HISTORY OF PRESENT ILLNESS:  Sylvester Salonga  is a 77 y.o. female with a known history listed below presented to emergency room for evaluation of increased heart rate and shortness of breath.  Patient went to see her primary care physician today and her heart rate was elevated.  EKG done that showed A. fib with RVR.  Patient transferred to emergency room for further evaluation.  Patient has dry cough and shortness of breath since last 2 to 3 weeks that is progressively worsening.  Patient denies chest pain or tightness.  Denies fever or chills.  Denies sputum production.  In the emergency room patient heart rate was fluctuating between 140-160s.  Patient started on Cardizem drip in the emergency room.  X-ray chest suggestive of pulmonary congestion.  Hospitalist team requested for admission.  PAST MEDICAL HISTORY:   Past Medical History:  Diagnosis Date  . Arthritis    knees, Hands  . GERD (gastroesophageal reflux disease)     PAST SURGICAL HISTORY:   Past Surgical History:  Procedure Laterality Date  . CATARACT EXTRACTION W/PHACO Right 01/25/2016   Procedure: CATARACT EXTRACTION PHACO AND INTRAOCULAR LENS PLACEMENT (IOC);  Surgeon: Ronnell Freshwater, MD;  Location: [place];  Service: Ophthalmology;  Laterality: Right;  RIGHT  . CATARACT EXTRACTION W/PHACO Left 02/22/2016   Procedure: CATARACT EXTRACTION PHACO AND INTRAOCULAR LENS PLACEMENT (IOC);  Surgeon: Ronnell Freshwater, MD;  Location: [place];  Service: Ophthalmology;  Laterality: Left;  LEFT  . [place]  2010  . KNEE ARTHROSCOPY Right  2004  . REPLACEMENT TOTAL KNEE Right 2009   [facility]  . SKIN GRAFT Left 04/08/2013   Done on left index finger  . TONSILLECTOMY  1946    SOCIAL HISTORY:   Social History   Tobacco Use  . Smoking status: Former Smoker    Packs/day: 1.00    Years: 30.00    Pack years: 30.00    Types: Cigarettes    Last attempt to quit: 05/16/1989    Years since quitting: 28.9  . Smokeless tobacco: Never Used  Substance Use Topics  . Alcohol use: Yes    Alcohol/week: 14.0 standard drinks    Types: 7 Glasses of wine, 7 Standard drinks or equivalent per week    FAMILY HISTORY:   Family History  Problem Relation Age of Onset  . Hyperlipidemia Sister   . Atrial fibrillation Sister   . Transient ischemic attack Mother   . Heart attack Father   . Healthy Brother   . Breast cancer Sister 8  . Hyperlipidemia Sister   . Atrial fibrillation Sister     DRUG ALLERGIES:   Allergies  Allergen Reactions  . Levofloxacin     Other reaction(s): Joint Pains Other reaction(s): Other (See Comments) Joint pain  . Influenza Vaccines Other (See Comments)    Bell's Palsy  . Oysters [Shellfish Allergy] Swelling    She states she had eaten them three days in a row and she developed swelling around her eyes.     REVIEW OF SYSTEMS:   ROS 12 point review of system  reviewed positive as per HPI otherwise negative  MEDICATIONS AT HOME:   Prior to Admission medications   Medication Sig Start Date End Date Taking? Authorizing Provider  hydrochlorothiazide (MICROZIDE) 12.5 MG capsule Take 1 capsule (12.5 mg total) by mouth daily. 02/23/18  Yes Bacigalupo, Dionne Bucy, MD  Multiple Vitamin (MULTIVITAMIN) capsule Take 1 capsule by mouth daily.   Yes [provider]  naproxen sodium (ALEVE) 220 MG tablet Take 220 mg by mouth 2 (two) times daily as needed.   Yes [provider]  VENTOLIN HFA 108 (90 Base) MCG/ACT inhaler TAKE 1 TO 2 PUFFS EVERY 6 HOURS AS NEEDED FOR  SHORTNESS OF BREATH 04/16/18  Yes Bacigalupo, Dionne Bucy, MD      VITAL SIGNS:  Blood pressure (!) 128/103, pulse (!) 153, temperature 98.6 F (37 C), temperature source Oral, resp. rate (!) 31, height 5' 7.5" (1.715 m), weight 99.3 kg, SpO2 99 %.  PHYSICAL EXAMINATION:  Physical Exam  GENERAL:  77 y.o.-year-old patient lying in the bed with no acute distress.  EYES: Pupils equal, round, reactive to light and accommodation. No scleral icterus. Extraocular muscles intact.  HEENT: Head atraumatic, normocephalic. Oropharynx and nasopharynx clear.  NECK:  Supple, no jugular venous distention. No thyroid enlargement, no tenderness.  LUNGS: Bibasilar crackles, 1-2+ bilateral lower extremity swelling CARDIOVASCULAR: S1, S2 normal. No murmurs, rubs, or gallops.  Irregularly irregular tachycardia ABDOMEN: Soft, nontender, nondistended. Bowel sounds present. No organomegaly or mass.  EXTREMITIES: Nocyanosis, or clubbing.  NEUROLOGIC: Cranial nerves II through XII are intact. Muscle strength 5/5 in all extremities. Sensation intact. Gait not checked.  PSYCHIATRIC: The patient is alert and oriented x 3.  SKIN: No obvious rash, lesion, or ulcer.   LABORATORY PANEL:   CBC Recent Labs  Lab 05/03/18 1743  WBC 9.9  HGB 12.9  HCT 40.1  PLT 261   ------------------------------------------------------------------------------------------------------------------  Chemistries  Recent Labs  Lab 05/03/18 1743  NA 138  K 3.5  CL 105  CO2 23  GLUCOSE 103*  BUN 19  CREATININE 0.85  CALCIUM 9.1   ------------------------------------------------------------------------------------------------------------------  Cardiac Enzymes Recent Labs  Lab 05/03/18 1743  TROPONINI <0.03   ------------------------------------------------------------------------------------------------------------------  RADIOLOGY:  Dg Chest 2 View  Result Date: 05/03/2018 CLINICAL DATA:  Shortness of breath EXAM:  CHEST - 2 VIEW COMPARISON:  Chest radiograph 06/15/2017 FINDINGS: Cardiomegaly. Bilateral interstitial pulmonary opacities. Small bilateral pleural effusions. Thoracic spine degenerative changes. Incompletely visualized focal sclerotic lesion proximal right humerus. IMPRESSION: Cardiomegaly and findings suggestive of mild interstitial edema with small bilateral effusions. Incompletely visualized sclerotic lesion within the proximal right humerus. Consider dedicated right humerus radiographs for further evaluation. Electronically Signed   By: Lovey Newcomer M.D.   On: 05/03/2018 18:07      IMPRESSION AND PLAN:   1.  New onset of A. fib with RVR : Patient started on Cardizem drip.  Titrate as indicated.  Patient also started on aspirin 325 mg.  Cardiology consult requested.  Further discussion of anticoagulation with cardiology team.  Echocardiogram ordered.  Cycle cardiac enzymes.  Monitor on telemetry.  Check TSH and lipid panel.  2.  New onset of CHF: We will obtain echocardiogram.  Follow-up cardiology recommendation.  Patient started on IV Lasix.  Monitor input and output.  Daily weights.  3.  Hypertension: Monitor.  Adjust medication as indicated.  4.  History of alcohol use: Monitor for withdrawal.  CIWA protocol.  5.  Chronic other medical problems: Monitor.    DVT prophylaxis: Heparin subcutaneous  Family member phone numbers: Richardson Landry: [phone], [phone]  Estimated length of stay more than 2 midnight  Moderate to high risk secondary to above    All the records are reviewed and case discussed with ED provider. Management plans discussed with the patient, family and they are in agreement.  CODE STATUS: Full  TOTAL TIME TAKING CARE OF THIS PATIENT: 50 minutes.    Sedalia Muta M.D on 05/03/2018 at 9:56 PM  Between 7am to 6pm - Pager - [phone]  After 6pm go to [email or web address] - Proofreader  [facility]  Office   [phone]  CC: Primary care physician; [name], MD

## 2018-05-04 ENCOUNTER — Other Ambulatory Visit: Payer: Self-pay | Admitting: Physician Assistant

## 2018-05-04 DIAGNOSIS — J810 Acute pulmonary edema: Secondary | ICD-10-CM

## 2018-05-04 DIAGNOSIS — I5031 Acute diastolic (congestive) heart failure: Secondary | ICD-10-CM

## 2018-05-04 DIAGNOSIS — I4819 Other persistent atrial fibrillation: Secondary | ICD-10-CM

## 2018-05-04 DIAGNOSIS — J432 Centrilobular emphysema: Secondary | ICD-10-CM

## 2018-05-04 DIAGNOSIS — I4891 Unspecified atrial fibrillation: Secondary | ICD-10-CM

## 2018-05-04 DIAGNOSIS — I272 Pulmonary hypertension, unspecified: Secondary | ICD-10-CM

## 2018-05-04 LAB — COMPREHENSIVE METABOLIC PANEL
ALT: 62 U/L — AB (ref 0–44)
AST: 38 U/L (ref 15–41)
Albumin: 3.8 g/dL (ref 3.5–5.0)
Alkaline Phosphatase: 83 U/L (ref 38–126)
Anion gap: 8 (ref 5–15)
BUN: 17 mg/dL (ref 8–23)
CO2: 27 mmol/L (ref 22–32)
Calcium: 8.9 mg/dL (ref 8.9–10.3)
Chloride: 105 mmol/L (ref 98–111)
Creatinine, Ser: 0.72 mg/dL (ref 0.44–1.00)
GFR calc Af Amer: >60 mL/min (ref >60)
GFR calc non Af Amer: >60 mL/min (ref >60)
Glucose, Bld: 101 mg/dL — ABNORMAL HIGH (ref 70–99)
Potassium: 2.9 mmol/L — ABNORMAL LOW (ref 3.5–5.1)
Sodium: 140 mmol/L (ref 135–145)
Total Bilirubin: 1.2 mg/dL (ref 0.3–1.2)
Total Protein: 6.4 g/dL — ABNORMAL LOW (ref 6.5–8.1)

## 2018-05-04 LAB — GLUCOSE, CAPILLARY: Glucose-Capillary: 105 mg/dL — ABNORMAL HIGH (ref 70–99)

## 2018-05-04 LAB — LIPID PANEL
Cholesterol: 169 mg/dL (ref 0–200)
HDL: 54 mg/dL (ref >40)
LDL Cholesterol: 97 mg/dL (ref 0–99)
Total CHOL/HDL Ratio: 3.1 RATIO
Triglycerides: 88 mg/dL (ref <150)
VLDL: 18 mg/dL (ref 0–40)

## 2018-05-04 LAB — CBC
HEMATOCRIT: 39.4 % (ref 36.0–46.0)
HEMOGLOBIN: 12.7 g/dL (ref 12.0–15.0)
MCH: 29.7 pg (ref 26.0–34.0)
MCHC: 32.2 g/dL (ref 30.0–36.0)
MCV: 92.1 fL (ref 80.0–100.0)
Platelets: 251 10*3/uL (ref 150–400)
RBC: 4.28 MIL/uL (ref 3.87–5.11)
RDW: 13.2 % (ref 11.5–15.5)
WBC: 7.3 10*3/uL (ref 4.0–10.5)
nRBC: 0 % (ref 0.0–0.2)

## 2018-05-04 LAB — TSH: TSH: 1.615 u[IU]/mL (ref 0.350–4.500)

## 2018-05-04 LAB — PROTIME-INR
INR: 0.98
Prothrombin Time: 12.9 seconds (ref 11.4–15.2)

## 2018-05-04 LAB — TROPONIN I
Troponin I: <0.03 ng/mL (ref <0.03)
Troponin I: <0.03 ng/mL (ref <0.03)

## 2018-05-04 LAB — APTT: aPTT: 30 seconds (ref 24–36)

## 2018-05-04 LAB — MAGNESIUM: Magnesium: 2.6 mg/dL — ABNORMAL HIGH (ref 1.7–2.4)

## 2018-05-04 MED ORDER — APIXABAN 5 MG PO TABS: 5.0000 mg | ORAL_TABLET | Freq: Two times a day (BID) | ORAL | 0 refills | Status: DC

## 2018-05-04 MED ORDER — POTASSIUM CHLORIDE 20 MEQ PO PACK
40.0000 meq | PACK | Freq: Once | ORAL | Status: AC
  Administered 2018-05-04: 40 meq via ORAL
  Filled 2018-05-04: qty 2

## 2018-05-04 MED ORDER — HEPARIN BOLUS VIA INFUSION
4000.0000 [IU] | Freq: Once | INTRAVENOUS | Status: DC
  Filled 2018-05-04: qty 4000

## 2018-05-04 MED ORDER — APIXABAN 5 MG PO TABS
5.0000 mg | ORAL_TABLET | Freq: Two times a day (BID) | ORAL | Status: DC
  Administered 2018-05-04 – 2018-05-06 (×5): 5 mg via ORAL
  Filled 2018-05-04 (×8): qty 1

## 2018-05-04 MED ORDER — LORAZEPAM 2 MG/ML IJ SOLN: 1.0000 mg | Freq: Four times a day (QID) | INTRAMUSCULAR | Status: DC | PRN

## 2018-05-04 MED ORDER — HEPARIN (PORCINE) 25000 UT/250ML-% IV SOLN
1350.0000 [IU]/h | INTRAVENOUS | Status: DC
  Filled 2018-05-04: qty 250

## 2018-05-04 MED ORDER — POTASSIUM CHLORIDE CRYS ER 20 MEQ PO TBCR
40.0000 meq | EXTENDED_RELEASE_TABLET | Freq: Two times a day (BID) | ORAL | Status: DC
  Filled 2018-05-04: qty 2

## 2018-05-04 MED ORDER — DILTIAZEM HCL 30 MG PO TABS
60.0000 mg | ORAL_TABLET | Freq: Four times a day (QID) | ORAL | Status: DC
  Administered 2018-05-04 – 2018-05-05 (×3): 60 mg via ORAL
  Filled 2018-05-04 (×3): qty 2

## 2018-05-04 MED ORDER — FOLIC ACID 1 MG PO TABS
1.0000 mg | ORAL_TABLET | Freq: Every day | ORAL | Status: DC
  Administered 2018-05-04 – 2018-05-06 (×3): 1 mg via ORAL
  Filled 2018-05-04 (×3): qty 1

## 2018-05-04 MED ORDER — SODIUM CHLORIDE 0.9 % IV SOLN
INTRAVENOUS | Status: DC | PRN
  Administered 2018-05-04: 500 mL via INTRAVENOUS

## 2018-05-04 MED ORDER — DILTIAZEM HCL 30 MG PO TABS
30.0000 mg | ORAL_TABLET | Freq: Once | ORAL | Status: AC
  Administered 2018-05-04: 30 mg via ORAL
  Filled 2018-05-04: qty 1

## 2018-05-04 MED ORDER — DILTIAZEM HCL 30 MG PO TABS
30.0000 mg | ORAL_TABLET | Freq: Four times a day (QID) | ORAL | Status: DC
  Administered 2018-05-04: 30 mg via ORAL
  Filled 2018-05-04: qty 1

## 2018-05-04 MED ORDER — ADULT MULTIVITAMIN W/MINERALS CH
1.0000 | ORAL_TABLET | Freq: Every day | ORAL | Status: DC
  Administered 2018-05-04 – 2018-05-06 (×3): 1 via ORAL
  Filled 2018-05-04 (×3): qty 1

## 2018-05-04 MED ORDER — VITAMIN B-1 100 MG PO TABS
100.0000 mg | ORAL_TABLET | Freq: Every day | ORAL | Status: DC
  Administered 2018-05-04 – 2018-05-06 (×3): 100 mg via ORAL
  Filled 2018-05-04 (×3): qty 1

## 2018-05-04 MED ORDER — SODIUM CHLORIDE 0.9% FLUSH
10.0000 mL | Freq: Two times a day (BID) | INTRAVENOUS | Status: DC
  Administered 2018-05-04 – 2018-05-06 (×4): 10 mL via INTRAVENOUS

## 2018-05-04 MED ORDER — POTASSIUM CHLORIDE 10 MEQ/100ML IV SOLN
10.0000 meq | INTRAVENOUS | Status: DC
  Administered 2018-05-04 (×3): 10 meq via INTRAVENOUS
  Filled 2018-05-04 (×4): qty 100

## 2018-05-04 MED ORDER — MAGNESIUM SULFATE 2 GM/50ML IV SOLN
2.0000 g | Freq: Once | INTRAVENOUS | Status: AC
  Administered 2018-05-04: 2 g via INTRAVENOUS
  Filled 2018-05-04: qty 50

## 2018-05-04 MED ORDER — LORAZEPAM 1 MG PO TABS: 1.0000 mg | ORAL_TABLET | Freq: Four times a day (QID) | ORAL | Status: DC | PRN

## 2018-05-04 MED ORDER — THIAMINE HCL 100 MG/ML IJ SOLN: 100.0000 mg | Freq: Every day | INTRAMUSCULAR | Status: DC

## 2018-05-04 NOTE — Progress Notes (Signed)
Patient was transferred from the ER following acute onset of afib. On admission patient was A&O X4, ambulatory and denied been in any pain. Patient was oriented to her room , and the admission documentation was completed.. Patient has a Cardizem gtt already started in the ER. Patient remained in afib in the 90s - 120s.  Patient remained in afib in the 70-90 at rest, and in the 110-120 with activity.  AM potassium is 2.9, 75mEq potassium IV supplementation started per order.

## 2018-05-04 NOTE — Plan of Care (Signed)
  Problem: Education: Goal: Knowledge of General Education information will improve Description Including pain rating scale, medication(s)/side effects and non-pharmacologic comfort measures Outcome: Progressing   Problem: Health Behavior/Discharge Planning: Goal: Ability to manage health-related needs will improve Outcome: Progressing   Problem: Clinical Measurements: Goal: Ability to maintain clinical measurements within normal limits will improve Outcome: Progressing Goal: Will remain free from infection Outcome: Progressing Goal: Diagnostic test results will improve Outcome: Progressing Goal: Respiratory complications will improve Outcome: Progressing Goal: Cardiovascular complication will be avoided Outcome: Progressing   Problem: Nutrition: Goal: Adequate nutrition will be maintained Outcome: Progressing   Problem: Coping: Goal: Level of anxiety will decrease Outcome: Progressing   Problem: Elimination: Goal: Will not experience complications related to bowel motility Outcome: Progressing Goal: Will not experience complications related to urinary retention Outcome: Progressing   Problem: Safety: Goal: Ability to remain free from injury will improve Outcome: Progressing   Problem: Skin Integrity: Goal: Risk for impaired skin integrity will decrease Outcome: Progressing   Problem: Education: Goal: Knowledge of disease or condition will improve Outcome: Progressing Goal: Understanding of medication regimen will improve Outcome: Progressing Goal: Individualized Educational Video(s) Outcome: Progressing   Problem: Activity: Goal: Ability to tolerate increased activity will improve Outcome: Progressing   Problem: Cardiac: Goal: Ability to achieve and maintain adequate cardiopulmonary perfusion will improve Outcome: Progressing   Problem: Health Behavior/Discharge Planning: Goal: Ability to safely manage health-related needs after discharge will  improve Outcome: Progressing

## 2018-05-04 NOTE — Consult Note (Signed)
Cardiology Consultation:   Patient ID: Lauren Lloyd; [id number]; 1940-05-31   Admit date: 05/03/2018 Date of Consult: 05/04/2018  Primary Care Provider: [name], MD Primary Cardiologist: Fletcher Anon   Patient Profile:   Lauren Lloyd is a 77 y.o. female with a hx of coronary calcifications on prior chest CT in 2016, pulmonary hypertension, prior tobacco abuse quitting > 25 years prior, and HTN who is being seen today for the evaluation of new onset Afib with RVR at the request of Dr. Henrietta Dine.  History of Present Illness:   [name] was previously seen in 2016 with atypical chest pain and SOB. Work up at that time included a chest CT that showed no evidence of PE with mild coronary calcifications. Treadmill Myoview showed no evidence of ischemia with normal EF. Echo showed normal LVSF with moderate pulmonary hypertension with a PASP 53 mmHg. She was lost to follow up until 02/2017 when she reestablished with Dr. Fletcher Anon secondary to intermittent SOB. She was noted to have poorly controlled BP at that time with SBP of 180 mmHg. She underwent echo in 02/2017 that showed an EF of 60-65%, no RWMA, Gr1DD, mildly dilated left atrium measuring 41 mm and improved PASP 37 mmHg.   Patient has noted an increased cough productive of clear sputum for the past 6 weeks.  In this setting, there were no associated tachypalpitations up until earlier this week.  No chest pain, lower extremity swelling, orthopnea, dizziness, presyncope, or syncope.  Patient indicates she will typically get a cough that will last for approximately 6 weeks and has had to have antibiotics or steroids to improve this.  She was evaluated by her PCP for this cough and found to be in new onset A. fib with RVR and was transferred to [facility] ED for further evaluate.   Upon the patient's arrival to [facility] ED she was noted to be in A. fib with RVR with ventricular rates in the 150s to 160s bpm. EKG showed Afib with RVR with ventricular  rates in the 150s bpm, CXR showed cardiomegaly and findings interstitial edema with small bilateral pleural effusions with sclerotic lesion within the proximal right humerus. Labs showed troponin negative x 3, BNP 376, potassium 3.5-->2.9, ALT 62, TSH normal, LDL 97, CBC unremarkable.  Initial BP in the 322G systolic.  She was given IV Lasix with brisk urine output of 4 L.  She has been started on diltiazem drip for rate control with improvement and a ventricular rate in the 80s to low 100s beats per minute.  She remains in A. fib.  Following diuresis, she was noted to be significantly hypokalemic with a potassium of 2.9 as outlined above.  This has subsequently been undergoing repletion via IV.  She indicates she does not have a prescription drug plan with her Medicare nor does she have supplemental insurance.  With the help of the pharmacy, we have been able to determine if she will be able to receive 30 days of Eliquis for free through the [facility] outpatient pharmacy.  In this setting, heparin drip has been discontinued and the patient has been placed on Eliquis given her CHADS2VASc of at least 4 (HTN, age x 2, female).    Past Medical History:  Diagnosis Date  . Arthritis    knees, Hands  . GERD (gastroesophageal reflux disease)     Past Surgical History:  Procedure Laterality Date  . CATARACT EXTRACTION W/PHACO Right 01/25/2016   Procedure: CATARACT EXTRACTION PHACO AND  INTRAOCULAR LENS PLACEMENT (IOC);  Surgeon: Ronnell Freshwater, MD;  Location: [place];  Service: Ophthalmology;  Laterality: Right;  RIGHT  . CATARACT EXTRACTION W/PHACO Left 02/22/2016   Procedure: CATARACT EXTRACTION PHACO AND INTRAOCULAR LENS PLACEMENT (IOC);  Surgeon: Ronnell Freshwater, MD;  Location: [place];  Service: Ophthalmology;  Laterality: Left;  LEFT  . [place]  2010  . KNEE ARTHROSCOPY Right 2004  . REPLACEMENT TOTAL KNEE Right 2009   [facility]  .  SKIN GRAFT Left 04/08/2013   Done on left index finger  . TONSILLECTOMY  1946     Home Meds: Prior to Admission medications   Medication Sig Start Date End Date Taking? Authorizing Provider  hydrochlorothiazide (MICROZIDE) 12.5 MG capsule Take 1 capsule (12.5 mg total) by mouth daily. 02/23/18  Yes Bacigalupo, Dionne Bucy, MD  Multiple Vitamin (MULTIVITAMIN) capsule Take 1 capsule by mouth daily.   Yes [provider]  naproxen sodium (ALEVE) 220 MG tablet Take 220 mg by mouth 2 (two) times daily as needed.   Yes [provider]  VENTOLIN HFA 108 (90 Base) MCG/ACT inhaler TAKE 1 TO 2 PUFFS EVERY 6 HOURS AS NEEDED FOR SHORTNESS OF BREATH 04/16/18  Yes Bacigalupo, Dionne Bucy, MD    Inpatient Medications: Scheduled Meds: . aspirin EC  325 mg Oral Daily  . diltiazem  30 mg Oral Q6H  . docusate sodium  100 mg Oral BID  . folic acid  1 mg Oral Daily  . furosemide  40 mg Intravenous Q12H  . heparin  4,000 Units Intravenous Once  . multivitamin with minerals  1 tablet Oral Daily  . potassium chloride  40 mEq Oral BID  . thiamine  100 mg Oral Daily   Or  . thiamine  100 mg Intravenous Daily   Continuous Infusions: . sodium chloride 500 mL (05/04/18 0620)  . diltiazem (CARDIZEM) infusion 15 mg/hr (05/04/18 0907)  . heparin    . potassium chloride 10 mEq (05/04/18 0913)   PRN Meds: sodium chloride, acetaminophen **OR** acetaminophen, bisacodyl, guaiFENesin, LORazepam **OR** LORazepam, ondansetron **OR** ondansetron (ZOFRAN) IV  Allergies:   Allergies  Allergen Reactions  . Levofloxacin     Other reaction(s): Joint Pains Other reaction(s): Other (See Comments) Joint pain  . Influenza Vaccines Other (See Comments)    Bell's Palsy  . Oysters [Shellfish Allergy] Swelling    She states she had eaten them three days in a row and she developed swelling around her eyes.     Social History:   Social History   Socioeconomic History  . Marital status: Divorced    Spouse  name: Not on file  . Number of children: 5  . Years of education: college  . Highest education level: Bachelor's degree (e.g., BA, AB, BS)  Occupational History  . Occupation: Part Time    Employer: [employer]  Social Needs  . Financial resource strain: Not hard at all  . Food insecurity:    Worry: Never true    Inability: Never true  . Transportation needs:    Medical: No    Non-medical: No  Tobacco Use  . Smoking status: Former Smoker    Packs/day: 1.00    Years: 30.00    Pack years: 30.00    Types: Cigarettes    Last attempt to quit: 05/16/1989    Years since quitting: 28.9  . Smokeless tobacco: Never Used  Substance and Sexual Activity  . Alcohol use: Yes  Alcohol/week: 14.0 standard drinks    Types: 7 Glasses of wine, 7 Standard drinks or equivalent per week  . Drug use: No  . Sexual activity: Not Currently  Lifestyle  . Physical activity:    Days per week: Not on file    Minutes per session: Not on file  . Stress: Not at all  Relationships  . Social connections:    Talks on phone: Not on file    Gets together: Not on file    Attends religious service: Not on file    Active member of club or organization: Not on file    Attends meetings of clubs or organizations: Not on file    Relationship status: Not on file  . Intimate partner violence:    Fear of current or ex partner: Not on file    Emotionally abused: Not on file    Physically abused: Not on file    Forced sexual activity: Not on file  Other Topics Concern  . Not on file  Social History Narrative   Pt has a child who passed away at age 78     Family History:   Family History  Problem Relation Age of Onset  . Hyperlipidemia Sister   . Atrial fibrillation Sister   . Transient ischemic attack Mother   . Heart attack Father   . Healthy Brother   . Breast cancer Sister 74  . Hyperlipidemia Sister   . Atrial fibrillation Sister     ROS:  Review of Systems  Constitutional: Positive  for malaise/fatigue. Negative for chills, diaphoresis, fever and weight loss.  HENT: Negative for congestion.   Eyes: Negative for discharge and redness.  Respiratory: Positive for cough, sputum production and shortness of breath. Negative for hemoptysis and wheezing.        Clear sputum   Cardiovascular: Positive for palpitations. Negative for chest pain, orthopnea, claudication, leg swelling and PND.  Gastrointestinal: Negative for abdominal pain, blood in stool, heartburn, melena, nausea and vomiting.  Genitourinary: Negative for hematuria.  Musculoskeletal: Negative for falls and myalgias.  Skin: Negative for rash.  Neurological: Positive for weakness. Negative for dizziness, tingling, tremors, sensory change, speech change, focal weakness and loss of consciousness.  Endo/Heme/Allergies: Does not bruise/bleed easily.  Psychiatric/Behavioral: Negative for substance abuse. The patient is not nervous/anxious.   All other systems reviewed and are negative.     Physical Exam/Data:   Vitals:   05/04/18 0211 05/04/18 0400 05/04/18 0538 05/04/18 0818  BP: 134/81 124/71 123/85 122/69  Pulse: 76 73 64 79  Resp:    20  Temp:   98.2 F (36.8 C) 97.8 F (36.6 C)  TempSrc:   Oral   SpO2:   95% 95%  Weight:   95.6 kg   Height:        Intake/Output Summary (Last 24 hours) at 05/04/2018 0953 Last data filed at 05/04/2018 0949 Gross per 24 hour  Intake -  Output 3250 ml  Net -3250 ml   Filed Weights   05/03/18 1733 05/04/18 0538  Weight: 99.3 kg 95.6 kg   Body mass index is 32.51 kg/m.   Physical Exam: General: Well developed, well nourished, in no acute distress.  Speaking short phrases. Head: Normocephalic, atraumatic, sclera non-icteric, no xanthomas, nares without discharge.  Neck: Negative for carotid bruits. JVD not elevated. Lungs: Diminished breath sounds along the bilateral bases. Breathing is unlabored. Heart: Tachycardic, irregularly irregular with S1 S2. No murmurs,  rubs, or gallops appreciated. Abdomen: Soft, non-tender,  non-distended with normoactive bowel sounds. No hepatomegaly. No rebound/guarding. No obvious abdominal masses. Msk:  Strength and tone appear normal for age. Extremities: No clubbing or cyanosis. No edema. Distal pedal pulses are 2+ and equal bilaterally. Neuro: Alert and oriented X 3. No facial asymmetry. No focal deficit. Moves all extremities spontaneously. Psych:  Responds to questions appropriately with a normal affect.   EKG:  The EKG was personally reviewed and demonstrates: Afib with RVR, 150 bpm, occasional PVCs, nonspecific st/t changes  Telemetry:  Telemetry was personally reviewed and demonstrates: Afib with ventricular rates currently in the 90s bpm  Weights: Filed Weights   05/03/18 1733 05/04/18 0538  Weight: 99.3 kg 95.6 kg    Relevant CV Studies: Echo 02/2017: Study Conclusions  - Left ventricle: The cavity size was normal. There was mild   concentric hypertrophy. Systolic function was normal. The   estimated ejection fraction was in the range of 60% to 65%. Wall   motion was normal; there were no regional wall motion   abnormalities. Doppler parameters are consistent with abnormal   left ventricular relaxation (grade 1 diastolic dysfunction). - Left atrium: The atrium was mildly dilated. - Pulmonary arteries: Systolic pressure was at the upper limits of   normal. PA peak pressure: 37 mm Hg (S). __________  02/2015:  Blood pressure demonstrated a hypertensive response to exercise.   Exercise myocardial perfusion imaging study with no significant ischemia Normal wall motion, EF estimated at 66% Borderline EKG changes concerning for ischemia at peak exercise. Adequate exercise tolerance. Hypertensive response to exercise.   Low risk scan __________  Laboratory Data:  Chemistry Recent Labs  Lab 05/03/18 1743 05/04/18 0326  NA 138 140  K 3.5 2.9*  CL 105 105  CO2 23 27  GLUCOSE 103* 101*    BUN 19 17  CREATININE 0.85 0.72  CALCIUM 9.1 8.9  GFRNONAA >60 >60  GFRAA >60 >60  ANIONGAP 10 8    Recent Labs  Lab 05/04/18 0326  PROT 6.4*  ALBUMIN 3.8  AST 38  ALT 62*  ALKPHOS 83  BILITOT 1.2   Hematology Recent Labs  Lab 05/03/18 1743 05/04/18 0326  WBC 9.9 7.3  RBC 4.30 4.28  HGB 12.9 12.7  HCT 40.1 39.4  MCV 93.3 92.1  MCH 30.0 29.7  MCHC 32.2 32.2  RDW 13.2 13.2  PLT 261 251   Cardiac Enzymes Recent Labs  Lab 05/03/18 1743 05/03/18 2220 05/04/18 0326  TROPONINI <0.03 <0.03 <0.03   No results for input(s): TROPIPOC in the last 168 hours.  BNP Recent Labs  Lab 05/03/18 1743  BNP 376.0*    DDimer No results for input(s): DDIMER in the last 168 hours.  Radiology/Studies:  Dg Chest 2 View  Result Date: 05/03/2018 IMPRESSION: Cardiomegaly and findings suggestive of mild interstitial edema with small bilateral effusions. Incompletely visualized sclerotic lesion within the proximal right humerus. Consider dedicated right humerus radiographs for further evaluation. Electronically Signed   By: Lovey Newcomer M.D.   On: 05/03/2018 18:07    Assessment and Plan:   1.  New onset A. fib with RVR: -Uncertain timing of onset however based off history this certainly may have been going on for the past 4 to 6 weeks -She remains in A. fib with improved ventricular rates to the 80s to low 100s beats per minute -Transition from diltiazem drip to short acting diltiazem with plans to consolidate to long-acting likely on 12/21 pending ventricular rates -Patient does not have prescription plan with  her Medicare or supplemental insurance.  She is on a fixed income.  With the help of the [facility] pharmacy we have been able to determine if she can receive 30 days of Eliquis for free through the [facility] outpatient pharmacy with the meds to beds program.  This prescription has been sent electronically to the [facility] outpatient pharmacy -Patient has been started on Eliquis 5 mg twice  daily -Discontinue aspirin with initiation of Eliquis -She will need to ambulate to assess adequate ventricular rate control prior to discharge -Should she demonstrate adequate heart rate control with ambulation over the next 24 to 48 hours she could be discharged following adequate diuresis with outpatient follow-up.  If she remains in A. fib at that time, outpatient cardioversion can be pursued after she has been adequately anticoagulated for at least 3 to 4 weeks.  Should her ventricular rates be difficult to control or if she is symptomatic with ambulation she will require TEE/DCCV prior to discharge -TSH normal this admission -Magnesium 2.6 -Potassium low 3.5 trending to 2.9 which is currently being repleted.  Recommend to maintain potassium 4.0 -Check echo -She has ruled out  2.  Acute bilateral pulmonary edema: -Likely in the setting of tachycardic heart rates -She has diuresed well with a documented urine output of 4 L for the admission -She continues to appear volume up -Continue IV Lasix with KCl repletion for at least the next 24 hours -If she is found to have a cardiomyopathy she may require ischemic evaluation however, this could also be in the setting of her persistent tachycardic heart rates  3.  Hypokalemia: -Replete to goal 4.0 as above  4.  Hypertension: -Blood pressure has been soft on diltiazem drip -Transition from IV diltiazem to short acting diltiazem as outlined above  5.  Pulmonary hypertension: -Most recent echo from 02/2017 demonstrated improvement in pulmonary arterial pressure -Diurese as above -Check echo as above    For questions or updates, please contact [place] Please consult [email or web address] for contact info under Cardiology/STEMI.   Signed, Christell Faith, PA-C [place] Pager: [phone] 05/04/2018, 9:53 AM

## 2018-05-04 NOTE — Plan of Care (Signed)
    Problem: Education: Goal: Knowledge of disease or condition will improve Outcome: Progressing   Problem: Education: Goal: Understanding of medication regimen will improve Outcome: Progressing   Problem: Health Behavior/Discharge Planning: Goal: Ability to safely manage health-related needs after discharge will improve Outcome: Progressing

## 2018-05-04 NOTE — Consult Note (Signed)
ANTICOAGULATION CONSULT NOTE - Initial Consult  Pharmacy Consult for heparin Indication: atrial fibrillation  Allergies  Allergen Reactions  . Levofloxacin     Other reaction(s): Joint Pains Other reaction(s): Other (See Comments) Joint pain  . Influenza Vaccines Other (See Comments)    Bell's Palsy  . Oysters [Shellfish Allergy] Swelling    She states she had eaten them three days in a row and she developed swelling around her eyes.     Patient Measurements: Height: 5' 7.5" (171.5 cm) Weight: 210 lb 11.2 oz (95.6 kg) IBW/kg (Calculated) : 62.75 Heparin Dosing Weight: 84.7 kg  Vital Signs: Temp: 97.8 F (36.6 C) (12/20 0818) Temp Source: Oral (12/20 0538) BP: 122/69 (12/20 0818) Pulse Rate: 79 (12/20 0818)  Labs: Recent Labs    05/03/18 1743 05/03/18 2220 05/04/18 0326  HGB 12.9  --  12.7  HCT 40.1  --  39.4  PLT 261  --  251  CREATININE 0.85  --  0.72  TROPONINI <0.03 <0.03 <0.03    Estimated Creatinine Clearance: 69.4 mL/min (by C-G formula based on SCr of 0.72 mg/dL).   Medical History: Past Medical History:  Diagnosis Date  . Arthritis    knees, Hands  . GERD (gastroesophageal reflux disease)     Medications:  Scheduled:  . aspirin EC  325 mg Oral Daily  . diltiazem  30 mg Oral Q6H  . docusate sodium  100 mg Oral BID  . folic acid  1 mg Oral Daily  . furosemide  40 mg Intravenous Q12H  . multivitamin with minerals  1 tablet Oral Daily  . potassium chloride  40 mEq Oral BID  . thiamine  100 mg Oral Daily   Or  . thiamine  100 mg Intravenous Daily    Assessment: 75 you female admitted with new afib with RVR and CHF. Patient has little PMH and was not on anticoagulation outpatient. Was receiving SQ heparin inpatient - last dose this AM at ~0630. CHADsVASC score at least 4.   Goal of Therapy:  Heparin level 0.3-0.7 units/ml Monitor platelets by anticoagulation protocol: Yes   Plan:  Ordered baseline labs. Will order heparin 4000 unit bolus,  followed by 1350 unit/hr continuous infusion. Will order heparin level for 1900. Will order CBC with AM labs.  Paticia Stack, PharmD Pharmacy Resident  05/04/2018 9:40 AM

## 2018-05-04 NOTE — Progress Notes (Signed)
[place] [facility] [place] NAME: Lauren Lloyd    MR#:  [id number]  DATE OF BIRTH:  08-24-1940  SUBJECTIVE:   Admitted to the hospital secondary to shortness of breath and noted to be in new onset atrial fibrillation and also noted to be in CHF.  Feels a bit better today.   REVIEW OF SYSTEMS:    Review of Systems  Constitutional: Negative for chills and fever.  HENT: Negative for congestion and tinnitus.   Eyes: Negative for blurred vision and double vision.  Respiratory: Positive for shortness of breath. Negative for cough and wheezing.   Cardiovascular: Positive for palpitations. Negative for chest pain, orthopnea and PND.  Gastrointestinal: Negative for abdominal pain, diarrhea, nausea and vomiting.  Genitourinary: Negative for dysuria and hematuria.  Neurological: Negative for dizziness, sensory change and focal weakness.  All other systems reviewed and are negative.   Nutrition: Heart Healthy Tolerating Diet: Yes Tolerating PT: Ambulatory.     DRUG ALLERGIES:   Allergies  Allergen Reactions  . Levofloxacin     Other reaction(s): Joint Pains Other reaction(s): Other (See Comments) Joint pain  . Influenza Vaccines Other (See Comments)    Bell's Palsy  . Oysters [Shellfish Allergy] Swelling    She states she had eaten them three days in a row and she developed swelling around her eyes.     VITALS:  Blood pressure (!) 135/115, pulse 77, temperature 97.8 F (36.6 C), resp. rate 20, height 5' 7.5" (1.715 m), weight 95.6 kg, SpO2 95 %.  PHYSICAL EXAMINATION:   Physical Exam  GENERAL:  77 y.o.-year-old patient lying in bed in no acute distress.  EYES: Pupils equal, round, reactive to light and accommodation. No scleral icterus. Extraocular muscles intact.  HEENT: Head atraumatic, normocephalic. Oropharynx and nasopharynx clear.  NECK:  Supple, no jugular venous distention. No thyroid enlargement, no tenderness.  LUNGS:  Normal breath sounds bilaterally, no wheezing, bibasilar rales, No rhonchi. No use of accessory muscles of respiration.  CARDIOVASCULAR: S1, S2 Irregular. No murmurs, rubs, or gallops.  ABDOMEN: Soft, nontender, nondistended. Bowel sounds present. No organomegaly or mass.  EXTREMITIES: No cyanosis, clubbing or edema b/l.    NEUROLOGIC: Cranial nerves II through XII are intact. No focal Motor or sensory deficits b/l.   PSYCHIATRIC: The patient is alert and oriented x 3.  SKIN: No obvious rash, lesion, or ulcer.    LABORATORY PANEL:   CBC Recent Labs  Lab 05/04/18 0326  WBC 7.3  HGB 12.7  HCT 39.4  PLT 251   ------------------------------------------------------------------------------------------------------------------  Chemistries  Recent Labs  Lab 05/04/18 0326 05/04/18 0959  NA 140  --   K 2.9*  --   CL 105  --   CO2 27  --   GLUCOSE 101*  --   BUN 17  --   CREATININE 0.72  --   CALCIUM 8.9  --   MG  --  2.6*  AST 38  --   ALT 62*  --   ALKPHOS 83  --   BILITOT 1.2  --    ------------------------------------------------------------------------------------------------------------------  Cardiac Enzymes Recent Labs  Lab 05/04/18 0959  TROPONINI <0.03   ------------------------------------------------------------------------------------------------------------------  RADIOLOGY:  Dg Chest 2 View  Result Date: 05/03/2018 CLINICAL DATA:  Shortness of breath EXAM: CHEST - 2 VIEW COMPARISON:  Chest radiograph 06/15/2017 FINDINGS: Cardiomegaly. Bilateral interstitial pulmonary opacities. Small bilateral pleural effusions. Thoracic spine degenerative changes. Incompletely visualized focal sclerotic lesion proximal right humerus.  IMPRESSION: Cardiomegaly and findings suggestive of mild interstitial edema with small bilateral effusions. Incompletely visualized sclerotic lesion within the proximal right humerus. Consider dedicated right humerus radiographs for further  evaluation. Electronically Signed   By: Lovey Newcomer M.D.   On: 05/03/2018 18:07     ASSESSMENT AND PLAN:   77 year old female with past medical history of hypertension, asthma who presents to the hospital due to shortness of breath and noted to be in new onset atrial fibrillation with mild CHF.  1.  New onset atrial fibrillation-likely triggered by patient's recent respiratory illness.  Unclear how long she was in atrial fibrillation. - Appreciate cardiology input and continue Cardizem drip, oral Cardizem started to wean off the drip. - Started on Eliquis. -Await echocardiogram results.  2.  CHF- secondary patient being in new onset atrial fibrillation with RVR. -Continue diuresis with IV Lasix, follow I's and O's. -Continue Cardizem for rate control.  Await echocardiogram results, appreciate cardiology input.  3. Hypokalemia - will cont. To supplement and repeat in a.m  - Mg. Level is normal.   All the records are reviewed and case discussed with Care Management/Social Worker. Management plans discussed with the patient, family and they are in agreement.  CODE STATUS: Full code  DVT Prophylaxis: Eliquis  TOTAL TIME TAKING CARE OF THIS PATIENT: 35 minutes.   POSSIBLE D/C IN 1-2 DAYS, DEPENDING ON CLINICAL CONDITION.   Henreitta Leber M.D on 05/04/2018 at 4:29 PM  Between 7am to 6pm - Pager - [phone]  After 6pm go to [email or web address] - Proofreader  [facility]  Office  [phone]  CC: Primary care physician; [name], MD

## 2018-05-04 NOTE — Consult Note (Signed)
Apixaban Counseling  Called patient's pharmacy, [facility], in [place] to assess ability to use 30 day coupon card for rivaroxaban. Pharmacy unsure if they could accept, so utilized meds to bed program for apixaban. Patient has received medication for outpatient use by way of close friend, Joseph Art. Educated patient on apixaban for atrial fibrillation. Discussed indication, adverse effects, OTC concomitant use, dose, etc. Patient fully understood.  Paticia Stack, PharmD Pharmacy Resident  05/04/2018 1:37 PM

## 2018-05-05 ENCOUNTER — Inpatient Hospital Stay (HOSPITAL_COMMUNITY)
Admit: 2018-05-05 | Discharge: 2018-05-05 | Disposition: A | Discharge status: Home / Self Care | Payer: Medicare HMO | Attending: Specialist | Admitting: Specialist

## 2018-05-05 DIAGNOSIS — I361 Nonrheumatic tricuspid (valve) insufficiency: Secondary | ICD-10-CM

## 2018-05-05 DIAGNOSIS — I10 Essential (primary) hypertension: Secondary | ICD-10-CM

## 2018-05-05 DIAGNOSIS — I480 Paroxysmal atrial fibrillation: Principal | ICD-10-CM

## 2018-05-05 LAB — BASIC METABOLIC PANEL
Anion gap: 9 (ref 5–15)
BUN: 20 mg/dL (ref 8–23)
CHLORIDE: 103 mmol/L (ref 98–111)
CO2: 26 mmol/L (ref 22–32)
CREATININE: 0.85 mg/dL (ref 0.44–1.00)
Calcium: 8.9 mg/dL (ref 8.9–10.3)
GFR calc Af Amer: >60 mL/min (ref >60)
GFR calc non Af Amer: >60 mL/min (ref >60)
Glucose, Bld: 106 mg/dL — ABNORMAL HIGH (ref 70–99)
Potassium: 3.9 mmol/L (ref 3.5–5.1)
Sodium: 138 mmol/L (ref 135–145)

## 2018-05-05 LAB — CBC
HCT: 41.1 % (ref 36.0–46.0)
Hemoglobin: 13.2 g/dL (ref 12.0–15.0)
MCH: 29.5 pg (ref 26.0–34.0)
MCHC: 32.1 g/dL (ref 30.0–36.0)
MCV: 91.9 fL (ref 80.0–100.0)
Platelets: 247 10*3/uL (ref 150–400)
RBC: 4.47 MIL/uL (ref 3.87–5.11)
RDW: 13.2 % (ref 11.5–15.5)
WBC: 7.6 10*3/uL (ref 4.0–10.5)
nRBC: 0 % (ref 0.0–0.2)

## 2018-05-05 LAB — GLUCOSE, CAPILLARY: Glucose-Capillary: 102 mg/dL — ABNORMAL HIGH (ref 70–99)

## 2018-05-05 LAB — ECHOCARDIOGRAM COMPLETE
Height: 67.5 in
Weight: 3344 oz

## 2018-05-05 MED ORDER — DILTIAZEM HCL ER COATED BEADS 180 MG PO CP24
300.0000 mg | ORAL_CAPSULE | Freq: Every day | ORAL | Status: DC
  Administered 2018-05-05 – 2018-05-06 (×2): 300 mg via ORAL
  Filled 2018-05-05 (×2): qty 1

## 2018-05-05 MED ORDER — NAPROXEN 250 MG PO TABS
125.0000 mg | ORAL_TABLET | Freq: Once | ORAL | Status: AC
  Administered 2018-05-05: 125 mg via ORAL
  Filled 2018-05-05: qty 1

## 2018-05-05 MED ORDER — METOPROLOL TARTRATE 25 MG PO TABS
25.0000 mg | ORAL_TABLET | Freq: Two times a day (BID) | ORAL | Status: DC
  Administered 2018-05-05 – 2018-05-06 (×3): 25 mg via ORAL
  Filled 2018-05-05 (×4): qty 1

## 2018-05-05 NOTE — Progress Notes (Signed)
[facility] [place] NAME: Lauren Lloyd    MR#:  [id number]  DATE OF BIRTH:  Jul 11, 1940  SUBJECTIVE:   Admitted to the hospital secondary to shortness of breath and noted to be in new onset atrial fibrillation and also noted to be in CHF.  Feels a bit better today. Out in the chair. HR in th e 120's--pt anxious to go home  REVIEW OF SYSTEMS:    Review of Systems  Constitutional: Negative for chills and fever.  HENT: Negative for congestion and tinnitus.   Eyes: Negative for blurred vision and double vision.  Respiratory: Positive for shortness of breath. Negative for cough and wheezing.   Cardiovascular: Positive for palpitations. Negative for chest pain, orthopnea and PND.  Gastrointestinal: Negative for abdominal pain, diarrhea, nausea and vomiting.  Genitourinary: Negative for dysuria and hematuria.  Neurological: Negative for dizziness, sensory change and focal weakness.  All other systems reviewed and are negative.   Nutrition: Heart Healthy Tolerating Diet: Yes Tolerating PT: Ambulatory.  DRUG ALLERGIES:   Allergies  Allergen Reactions  . Levofloxacin     Other reaction(s): Joint Pains Other reaction(s): Other (See Comments) Joint pain  . Influenza Vaccines Other (See Comments)    Bell's Palsy  . Oysters [Shellfish Allergy] Swelling    She states she had eaten them three days in a row and she developed swelling around her eyes.     VITALS:  Blood pressure 128/81, pulse 86, temperature 98.1 F (36.7 C), temperature source Oral, resp. rate 19, height 5' 7.5" (1.715 m), weight 94.8 kg, SpO2 95 %.  PHYSICAL EXAMINATION:   Physical Exam  GENERAL:  77 y.o.-year-old patient lying in bed in no acute distress.  EYES: Pupils equal, round, reactive to light and accommodation. No scleral icterus. Extraocular muscles intact.  HEENT: Head atraumatic, normocephalic. Oropharynx and nasopharynx clear.  NECK:  Supple, no jugular  venous distention. No thyroid enlargement, no tenderness.  LUNGS: Normal breath sounds bilaterally, no wheezing, bibasilar rales, No rhonchi. No use of accessory muscles of respiration.  CARDIOVASCULAR: S1, S2 Irregular. No murmurs, rubs, or gallops. irregulary irregular, tachycardia ABDOMEN: Soft, nontender, nondistended. Bowel sounds present. No organomegaly or mass.  EXTREMITIES: No cyanosis, clubbing or edema b/l.    NEUROLOGIC: Cranial nerves II through XII are intact. No focal Motor or sensory deficits b/l.   PSYCHIATRIC: The patient is alert and oriented x 3.  SKIN: No obvious rash, lesion, or ulcer.    LABORATORY PANEL:   CBC Recent Labs  Lab 05/05/18 0437  WBC 7.6  HGB 13.2  HCT 41.1  PLT 247   ------------------------------------------------------------------------------------------------------------------  Chemistries  Recent Labs  Lab 05/04/18 0326 05/04/18 0959 05/05/18 0437  NA 140  --  138  K 2.9*  --  3.9  CL 105  --  103  CO2 27  --  26  GLUCOSE 101*  --  106*  BUN 17  --  20  CREATININE 0.72  --  0.85  CALCIUM 8.9  --  8.9  MG  --  2.6*  --   AST 38  --   --   ALT 62*  --   --   ALKPHOS 83  --   --   BILITOT 1.2  --   --    ------------------------------------------------------------------------------------------------------------------  Cardiac Enzymes Recent Labs  Lab 05/04/18 0959  TROPONINI <0.03   ------------------------------------------------------------------------------------------------------------------  RADIOLOGY:  Dg Chest 2 View  Result Date: 05/03/2018  CLINICAL DATA:  Shortness of breath EXAM: CHEST - 2 VIEW COMPARISON:  Chest radiograph 06/15/2017 FINDINGS: Cardiomegaly. Bilateral interstitial pulmonary opacities. Small bilateral pleural effusions. Thoracic spine degenerative changes. Incompletely visualized focal sclerotic lesion proximal right humerus. IMPRESSION: Cardiomegaly and findings suggestive of mild interstitial  edema with small bilateral effusions. Incompletely visualized sclerotic lesion within the proximal right humerus. Consider dedicated right humerus radiographs for further evaluation. Electronically Signed   By: Lovey Newcomer M.D.   On: 05/03/2018 18:07     ASSESSMENT AND PLAN:   77 year old female with past medical history of hypertension, asthma who presents to the hospital due to shortness of breath and noted to be in new onset atrial fibrillation with mild CHF.  1.  New onset atrial fibrillation-likely triggered by patient's recent respiratory illness. -  Unclear how long she was in atrial fibrillation. - Appreciate cardiology input and continue Cardizem drip, oral Cardizem started to wean off the drip--now on Cardizem CD 300 mg and metoprolol 25 mg bid - Started on Eliquis. -echocardiogram shows EF 55-60 %  2. Acute Diastolic CHF- secondary patient being in new onset atrial fibrillation with RVR. -Continue diuresis with IV Lasix, follow I's and O's.--uop >7.5 liters so far--lasix 40 mg daily at d/c -Continue Cardizem for rate control.  -appreciate cardiology input.  3. Hypokalemia - will cont. To supplement and repeat in a.m  - Mg. Level is normal.   All the records are reviewed and case discussed with Care Management/Social Worker. Management plans discussed with the patient, family and they are in agreement.  CODE STATUS: Full code  DVT Prophylaxis: Eliquis  TOTAL TIME TAKING CARE OF THIS PATIENT: 35 minutes.   POSSIBLE D/C IN 1-2 DAYS, DEPENDING ON CLINICAL CONDITION.   Fritzi Mandes M.D on 05/05/2018 at 1:26 PM  Between 7am to 6pm - Pager - [phone]  After 6pm go to [email or web address] - Proofreader  [facility]  Office  [phone]  CC: Primary care physician; [name], MD

## 2018-05-05 NOTE — Plan of Care (Signed)
Patient is diuresing. Heart rate remains stable. Patient denies any shortness of breath. Continue education about self care.

## 2018-05-05 NOTE — Plan of Care (Signed)
  Problem: Education: Goal: Knowledge of disease or condition will improve Outcome: Progressing Goal: Understanding of medication regimen will improve Outcome: Progressing   Problem: Activity: Goal: Ability to tolerate increased activity will improve Outcome: Progressing   Problem: Clinical Measurements: Goal: Ability to maintain clinical measurements within normal limits will improve Outcome: Not Progressing

## 2018-05-05 NOTE — Progress Notes (Signed)
Progress Note  Patient Name: Lauren Lloyd Date of Encounter: 05/05/2018  Primary Cardiologist: No primary care provider on file. new  Subjective   Feeling better.  Breathing has improved.  Wants to go home.  Inpatient Medications    Scheduled Meds: . apixaban  5 mg Oral BID  . diltiazem  60 mg Oral Q6H  . docusate sodium  100 mg Oral BID  . folic acid  1 mg Oral Daily  . furosemide  40 mg Intravenous Q12H  . multivitamin with minerals  1 tablet Oral Daily  . sodium chloride flush  10 mL Intravenous Q12H  . thiamine  100 mg Oral Daily   Or  . thiamine  100 mg Intravenous Daily   Continuous Infusions: . sodium chloride 500 mL (05/04/18 0620)   PRN Meds: sodium chloride, acetaminophen **OR** acetaminophen, bisacodyl, guaiFENesin, LORazepam **OR** LORazepam, ondansetron **OR** ondansetron (ZOFRAN) IV   Vital Signs    Vitals:   05/05/18 0028 05/05/18 0442 05/05/18 0618 05/05/18 0739  BP:  123/83 120/70 128/81  Pulse:  71 67 86  Resp:  20  19  Temp:  97.6 F (36.4 C)  98.1 F (36.7 C)  TempSrc:  Tympanic  Oral  SpO2:  96%  95%  Weight: 94.8 kg     Height:        Intake/Output Summary (Last 24 hours) at 05/05/2018 1127 Last data filed at 05/05/2018 0959 Gross per 24 hour  Intake 726.75 ml  Output 4350 ml  Net -3623.25 ml   Filed Weights   05/03/18 1733 05/04/18 0538 05/05/18 0028  Weight: 99.3 kg 95.6 kg 94.8 kg    Telemetry    Atrial fibrillation.  Rate 80s to 140s.  2-second pause- Personally Reviewed  ECG    N/A - Personally Reviewed  Physical Exam   VS:  BP 128/81 (BP Location: Left Arm)   Pulse 86   Temp 98.1 F (36.7 C) (Oral)   Resp 19   Ht 5' 7.5" (1.715 m)   Wt 94.8 kg   SpO2 95%   BMI 32.25 kg/m  , BMI Body mass index is 32.25 kg/m. GENERAL:  Well appearing HEENT: Pupils equal round and reactive, fundi not visualized, oral mucosa unremarkable NECK:  No jugular venous distention, waveform within normal limits, carotid upstroke  brisk and symmetric, no bruits LUNGS:  Clear to auscultation bilaterally HEART: Tachycardic.  Irregularly irregular PMI not displaced or sustained,S1 and S2 within normal limits, no S3, no S4, no clicks, no rubs, no murmurs ABD:  Flat, positive bowel sounds normal in frequency in pitch, no bruits, no rebound, no guarding, no midline pulsatile mass, no hepatomegaly, no splenomegaly EXT:  2 plus pulses throughout, trace edema, no cyanosis no clubbing SKIN:  No rashes no nodules NEURO:  Cranial nerves II through XII grossly intact, motor grossly intact throughout PSYCH:  Cognitively intact, oriented to person place and time   Labs    Chemistry Recent Labs  Lab 05/03/18 1743 05/04/18 0326 05/05/18 0437  NA 138 140 138  K 3.5 2.9* 3.9  CL 105 105 103  CO2 23 27 26   GLUCOSE 103* 101* 106*  BUN 19 17 20   CREATININE 0.85 0.72 0.85  CALCIUM 9.1 8.9 8.9  PROT  --  6.4*  --   ALBUMIN  --  3.8  --   AST  --  38  --   ALT  --  62*  --   ALKPHOS  --  83  --  BILITOT  --  1.2  --   GFRNONAA >60 >60 >60  GFRAA >60 >60 >60  ANIONGAP 10 8 9      Hematology Recent Labs  Lab 05/03/18 1743 05/04/18 0326 05/05/18 0437  WBC 9.9 7.3 7.6  RBC 4.30 4.28 4.47  HGB 12.9 12.7 13.2  HCT 40.1 39.4 41.1  MCV 93.3 92.1 91.9  MCH 30.0 29.7 29.5  MCHC 32.2 32.2 32.1  RDW 13.2 13.2 13.2  PLT 261 251 247    Cardiac Enzymes Recent Labs  Lab 05/03/18 1743 05/03/18 2220 05/04/18 0326 05/04/18 0959  TROPONINI <0.03 <0.03 <0.03 <0.03   No results for input(s): TROPIPOC in the last 168 hours.   BNP Recent Labs  Lab 05/03/18 1743  BNP 376.0*     DDimer No results for input(s): DDIMER in the last 168 hours.   Radiology    Dg Chest 2 View  Result Date: 05/03/2018 CLINICAL DATA:  Shortness of breath EXAM: CHEST - 2 VIEW COMPARISON:  Chest radiograph 06/15/2017 FINDINGS: Cardiomegaly. Bilateral interstitial pulmonary opacities. Small bilateral pleural effusions. Thoracic spine  degenerative changes. Incompletely visualized focal sclerotic lesion proximal right humerus. IMPRESSION: Cardiomegaly and findings suggestive of mild interstitial edema with small bilateral effusions. Incompletely visualized sclerotic lesion within the proximal right humerus. Consider dedicated right humerus radiographs for further evaluation. Electronically Signed   By: Lovey Newcomer M.D.   On: 05/03/2018 18:07    Cardiac Studies   Echo 05/04/2018: Study Conclusions  - Left ventricle: The cavity size was normal. There was mild focal   basal hypertrophy of the septum. Systolic function was normal.   The estimated ejection fraction was in the range of 55% to 60%.   Wall motion was normal; there were no regional wall motion   abnormalities. - Aortic valve: Transvalvular velocity was within the normal range.   There was no stenosis. There was no regurgitation. - Mitral valve: Transvalvular velocity was within the normal range.   There was no evidence for stenosis. There was mild regurgitation. - Left atrium: The atrium was severely dilated. - Right ventricle: The cavity size was mildly dilated. Wall   thickness was normal. Systolic function was normal. - Right atrium: The atrium was severely dilated. - Tricuspid valve: There was mild regurgitation. - Pulmonary arteries: Systolic pressure was within the normal   range. PA peak pressure: 34 mm Hg (S).  Patient Profile     77 y.o. female with COPD, tobacco abuse, and hypertension here with new onset atrial fibrillation.  Assessment & Plan    #Paroxysmal atrial fibrillation: New onset this admission.  Rates remain poorly-controlled.  We will add metoprolol tartrate 25 mg twice daily.  Switch diltiazem to 300 mg daily.  Continue Eliquis which was started this admission.  Thyroid function is within normal limits.  She does not drink caffeine or alcohol excessively.  Echo shows normal systolic function this admission.  # Acute diastolic heart  failure: Lauren Lloyd was net -5 L yesterday.  She is feeling much better and nearly euvolemic on exam.  We will switch Lasix to 40 mg oral twice daily.  Likely discharge on 40 mg oral daily.  She may not need this as an outpatient once her rate is adequately controlled.       For questions or updates, please contact [facility] Please consult [email or web address] for contact info under        Signed, Skeet Latch, MD  05/05/2018, 11:27 AM

## 2018-05-06 LAB — GLUCOSE, CAPILLARY: Glucose-Capillary: 88 mg/dL (ref 70–99)

## 2018-05-06 MED ORDER — METOPROLOL TARTRATE 25 MG PO TABS: 25.0000 mg | ORAL_TABLET | Freq: Two times a day (BID) | ORAL | 1 refills | Status: DC

## 2018-05-06 MED ORDER — DILTIAZEM HCL ER COATED BEADS 300 MG PO CP24: 300.0000 mg | ORAL_CAPSULE | Freq: Every day | ORAL | 1 refills | Status: DC

## 2018-05-06 MED ORDER — FUROSEMIDE 40 MG PO TABS: 40.0000 mg | ORAL_TABLET | Freq: Every day | ORAL | 11 refills | Status: DC

## 2018-05-06 NOTE — Progress Notes (Signed)
Patient discharged home where she lives alone, IV removed, tele box returned, VSS, HR controlled still in A fib, pt given follow up appointment information along with prescription education and regimen information. She was also supplied with her evening dose of eliquis via [facility]. [facility]. No concerns or complaints at this time.

## 2018-05-06 NOTE — Plan of Care (Signed)
  Problem: Clinical Measurements: Goal: Cardiovascular complication will be avoided Outcome: Progressing   Problem: Education: Goal: Understanding of medication regimen will improve Outcome: Progressing   Problem: Activity: Goal: Ability to tolerate increased activity will improve Outcome: Progressing

## 2018-05-06 NOTE — Discharge Summary (Signed)
[facility] [place] NAME: Lauren Lloyd    MR#:  [id number]  DATE OF BIRTH:  03-31-1941  DATE OF ADMISSION:  05/03/2018 ADMITTING PHYSICIAN: Sedalia Muta, MD  DATE OF DISCHARGE: 05/06/2018  PRIMARY CARE PHYSICIAN: [name], MD    ADMISSION DIAGNOSIS:  Atrial fibrillation with RVR ([place]) [I48.91]  DISCHARGE DIAGNOSIS:  Afib with RVR--new CHF acute diastolic --new  SECONDARY DIAGNOSIS:   Past Medical History:  Diagnosis Date  . Arthritis    knees, Hands  . GERD (gastroesophageal reflux disease)     HOSPITAL COURSE:  77 year old female with past medical history of hypertension, asthma who presents to the hospital due to shortness of breath and noted to be in new onset atrial fibrillation with mild CHF.  1.  New onset atrial fibrillation-likely triggered by patient's recent respiratory illness. -  Unclear how long she was in atrial fibrillation. - Appreciate cardiology input and continue Cardizem drip, oral Cardizem started to wean off the drip--now on Cardizem CD 300 mg and metoprolol 25 mg bid - Started on Eliquis. -echocardiogram shows EF 55-60 % -HR in the 80's  2. Acute Diastolic CHF- secondary patient being in new onset atrial fibrillation with RVR. -Continue diuresis with IV Lasix, follow I's and O's.--uop >9.5 liters so far--lasix 40 mg daily at d/c -Continue Cardizem for rate control.  -appreciate cardiology input--ok to go home with outpt f/u Dr Rockey Situ  3. Hypokalemia -improved - Mg. Level is normal.  D/c home   CONSULTS OBTAINED:  Treatment Team:  Corey Skains, MD Wellington Hampshire, MD Minna Merritts, MD Fritzi Mandes, MD  DRUG ALLERGIES:   Allergies  Allergen Reactions  . Levofloxacin     Other reaction(s): Joint Pains Other reaction(s): Other (See Comments) Joint pain  . Influenza Vaccines Other (See Comments)    Bell's Palsy  . Oysters [Shellfish Allergy] Swelling   She states she had eaten them three days in a row and she developed swelling around her eyes.     DISCHARGE MEDICATIONS:   Allergies as of 05/06/2018      Reactions   Levofloxacin    Other reaction(s): Joint Pains Other reaction(s): Other (See Comments) Joint pain   Influenza Vaccines Other (See Comments)   Bell's Palsy   Oysters [shellfish Allergy] Swelling   She states she had eaten them three days in a row and she developed swelling around her eyes.       Medication List    STOP taking these medications   hydrochlorothiazide 12.5 MG capsule Commonly known as:  MICROZIDE     TAKE these medications   apixaban 5 MG Tabs tablet Commonly known as:  ELIQUIS Take 1 tablet (5 mg total) by mouth 2 (two) times daily.   diltiazem 300 MG 24 hr capsule Commonly known as:  CARDIZEM CD Take 1 capsule (300 mg total) by mouth daily.   furosemide 40 MG tablet Commonly known as:  LASIX Take 1 tablet (40 mg total) by mouth daily.   metoprolol tartrate 25 MG tablet Commonly known as:  LOPRESSOR Take 1 tablet (25 mg total) by mouth 2 (two) times daily.   multivitamin capsule Take 1 capsule by mouth daily.   VENTOLIN HFA 108 (90 Base) MCG/ACT inhaler Generic drug:  albuterol TAKE 1 TO 2 PUFFS EVERY 6 HOURS AS NEEDED FOR SHORTNESS OF BREATH       If you experience worsening of your admission symptoms, develop shortness of breath,  life threatening emergency, suicidal or homicidal thoughts you must seek medical attention immediately by calling 911 or calling your MD immediately  if symptoms less severe.  You Must read complete instructions/literature along with all the possible adverse reactions/side effects for all the Medicines you take and that have been prescribed to you. Take any new Medicines after you have completely understood and accept all the possible adverse reactions/side effects.   Please note  You were cared for by a hospitalist during your hospital stay. If you have  any questions about your discharge medications or the care you received while you were in the hospital after you are discharged, you can call the unit and asked to speak with the hospitalist on call if the hospitalist that took care of you is not available. Once you are discharged, your primary care physician will handle any further medical issues. Please note that NO REFILLS for any discharge medications will be authorized once you are discharged, as it is imperative that you return to your primary care physician (or establish a relationship with a primary care physician if you do not have one) for your aftercare needs so that they can reassess your need for medications and monitor your lab values. Today   SUBJECTIVE   Doing well  VITAL SIGNS:  Blood pressure 136/76, pulse 71, temperature 97.8 F (36.6 C), temperature source Oral, resp. rate 20, height 5' 7.5" (1.715 m), weight 93.6 kg, SpO2 97 %.  I/O:    Intake/Output Summary (Last 24 hours) at 05/06/2018 0844 Last data filed at 05/06/2018 1308 Gross per 24 hour  Intake 960 ml  Output 1400 ml  Net -440 ml    PHYSICAL EXAMINATION:  GENERAL:  77 y.o.-year-old patient lying in the bed with no acute distress.  EYES: Pupils equal, round, reactive to light and accommodation. No scleral icterus. Extraocular muscles intact.  HEENT: Head atraumatic, normocephalic. Oropharynx and nasopharynx clear.  NECK:  Supple, no jugular venous distention. No thyroid enlargement, no tenderness.  LUNGS: Normal breath sounds bilaterally, no wheezing, rales,rhonchi or crepitation. No use of accessory muscles of respiration.  CARDIOVASCULAR: S1, S2 normal. No murmurs, rubs, or gallops. irregular ABDOMEN: Soft, non-tender, non-distended. Bowel sounds present. No organomegaly or mass.  EXTREMITIES: No pedal edema, cyanosis, or clubbing.  NEUROLOGIC: Cranial nerves II through XII are intact. Muscle strength 5/5 in all extremities. Sensation intact. Gait not  checked.  PSYCHIATRIC: The patient is alert and oriented x 3.  SKIN: No obvious rash, lesion, or ulcer.   DATA REVIEW:   CBC  Recent Labs  Lab 05/05/18 0437  WBC 7.6  HGB 13.2  HCT 41.1  PLT 247    Chemistries  Recent Labs  Lab 05/04/18 0326 05/04/18 0959 05/05/18 0437  NA 140  --  138  K 2.9*  --  3.9  CL 105  --  103  CO2 27  --  26  GLUCOSE 101*  --  106*  BUN 17  --  20  CREATININE 0.72  --  0.85  CALCIUM 8.9  --  8.9  MG  --  2.6*  --   AST 38  --   --   ALT 62*  --   --   ALKPHOS 83  --   --   BILITOT 1.2  --   --     Microbiology Results   No results found for this or any previous visit (from the past 240 hour(s)).  RADIOLOGY:  No results found.  Management plans discussed with the patient, family and they are in agreement.  CODE STATUS:     Code Status Orders  (From admission, onward)         Start     Ordered   05/03/18 2149  Full code  Continuous     05/03/18 2148        Code Status History    This patient has a current code status but no historical code status.      TOTAL TIME TAKING CARE OF THIS PATIENT: *40* minutes.    Fritzi Mandes M.D on 05/06/2018 at 8:44 AM  Between 7am to 6pm - Pager - [phone] After 6pm go to [email or web address] - password [email or web address]  Office  [phone]  CC: Primary care physician; [name], MD

## 2018-05-06 NOTE — Plan of Care (Signed)
  Problem: Health Behavior/Discharge Planning: Goal: Ability to manage health-related needs will improve Outcome: Adequate for Discharge   Problem: Clinical Measurements: Goal: Ability to maintain clinical measurements within normal limits will improve Outcome: Adequate for Discharge   Problem: Clinical Measurements: Goal: Respiratory complications will improve Outcome: Adequate for Discharge

## 2018-05-07 ENCOUNTER — Telehealth: Payer: Self-pay

## 2018-05-07 ENCOUNTER — Telehealth: Payer: Self-pay | Admitting: Cardiovascular Disease

## 2018-05-07 NOTE — Telephone Encounter (Signed)
Transition Care Management Follow-up Telephone Call  Date of discharge and from where: [facility] on 05/06/18  How have you been since you were released from the hospital? Doing well, just tired. Declines pain, fever, SOB or n/v/d.  Any questions or concerns? No   Items Reviewed:  Did the pt receive and understand the discharge instructions provided? Yes   Medications obtained and verified? Declined, will review at [place] apt.  Any new allergies since your discharge? No   Dietary orders reviewed? Yes  Do you have support at home? Yes   Other (ie: DME, Home Health, etc) N/A  Functional Questionnaire: (I = Independent and D = Dependent)  Bathing/Dressing- I   Meal Prep- I  Eating- I  Maintaining continence- I  Transferring/Ambulation- I  Managing Meds- I   Follow up appointments reviewed:    PCP Hospital f/u appt confirmed? Yes  Scheduled to see Dr Brita Romp on 05/24/18 @ 11:00 AM.  [facility] f/u appt confirmed? Yes , pt to call and schedule.  Are transportation arrangements needed? No   If their condition worsens, is the pt aware to call  their PCP or go to the ED? Yes  Was the patient provided with contact information for the PCP's office or ED? Yes  Was the pt encouraged to call back with questions or concerns? Yes

## 2018-05-07 NOTE — Telephone Encounter (Signed)
Patient wants to change to Dr. Rockey Situ .  Is this ok ?

## 2018-05-07 NOTE — Telephone Encounter (Signed)
I have made the 1st attempt to contact the patient or family member in charge, in order to follow up from recently being discharged from the hospital. I left a message on voicemail requesting a CB. -MM 

## 2018-05-07 NOTE — Telephone Encounter (Signed)
That's fine

## 2018-05-11 ENCOUNTER — Encounter: Payer: Self-pay | Admitting: Cardiovascular Disease

## 2018-05-11 ENCOUNTER — Ambulatory Visit: Payer: Medicare HMO | Admitting: Cardiovascular Disease

## 2018-05-11 VITALS — BP 133/84 | HR 69 | Ht 68.0 in | Wt 216.0 lb

## 2018-05-11 DIAGNOSIS — R600 Localized edema: Secondary | ICD-10-CM | POA: Diagnosis not present

## 2018-05-11 DIAGNOSIS — I5032 Chronic diastolic (congestive) heart failure: Secondary | ICD-10-CM | POA: Diagnosis not present

## 2018-05-11 DIAGNOSIS — F17200 Nicotine dependence, unspecified, uncomplicated: Secondary | ICD-10-CM | POA: Diagnosis not present

## 2018-05-11 DIAGNOSIS — J432 Centrilobular emphysema: Secondary | ICD-10-CM | POA: Diagnosis not present

## 2018-05-11 DIAGNOSIS — I480 Paroxysmal atrial fibrillation: Secondary | ICD-10-CM

## 2018-05-11 DIAGNOSIS — I272 Pulmonary hypertension, unspecified: Secondary | ICD-10-CM | POA: Diagnosis not present

## 2018-05-11 DIAGNOSIS — R69 Illness, unspecified: Secondary | ICD-10-CM | POA: Diagnosis not present

## 2018-05-11 MED ORDER — APIXABAN 5 MG PO TABS: 5.0000 mg | ORAL_TABLET | Freq: Two times a day (BID) | ORAL | 3 refills | Status: DC

## 2018-05-11 NOTE — Progress Notes (Signed)
Patient assistance application completed and will fax to assistance number listed on forms.

## 2018-05-11 NOTE — Telephone Encounter (Signed)
Spoke with patient and offered appointment for her follow up with Dr. Rockey Situ today. She was agreeable with this and had no further questions at this time.

## 2018-05-11 NOTE — Progress Notes (Signed)
Cardiology Office Note  Date:  05/11/2018   ID:  Lauren Lloyd, Lauren Lloyd 07-21-1940, MRN [id number]  PCP:  [name], MD   Chief Complaint  Patient presents with  . other    Follow up post hospital for Afib RVR. Patient c/o SOB and some swelling in ankles. Meds reviewed verbally with patient.     HPI:  77 year old female with past medical history of  hypertension,  asthma  Chronic lower extremity swelling Hospital admission for atrial fibrillation December 2019 Associated acute diastolic CHF Who presents for hospital follow-up, follow-up of her atrial fibrillation and acute on chronic diastolic CHF  Presented to the hospital with worsening shortness of breath, leg swelling, weight gain, abdominal bloating and new atrial fibrillation with RVR Initially on Cardizem infusion, changed to oral Cardizem with titration upwards Discharged on Cardizem CD 300 mg and metoprolol 25 mg bid, Eliquis. Treated with IV Lasix during her hospital course with marked improvement in her shortness of breath  echocardiogram reviewed with her in detail EF 55-60 % Dilated atria bilaterally  In follow-up reports continued lower extremity swelling, chronic She does have some shortness of breath on exertion, no worsening since her discharge Would like to go to [facility] for several days to see friends Feels she is tolerating her medications well She is taking Lasix 40 daily with no potassium  EKG personally reviewed by myself on todays visit Shows atrial fibrillation ventricular rate 69 bpm no significant ST or T wave changes  PMH:   has a past medical history of Arthritis and GERD (gastroesophageal reflux disease).  PSH:    Past Surgical History:  Procedure Laterality Date  . CATARACT EXTRACTION W/PHACO Right 01/25/2016   Procedure: CATARACT EXTRACTION PHACO AND INTRAOCULAR LENS PLACEMENT (IOC);  Surgeon: Ronnell Freshwater, MD;  Location: [facility];  Service: Ophthalmology;   Laterality: Right;  RIGHT  . CATARACT EXTRACTION W/PHACO Left 02/22/2016   Procedure: CATARACT EXTRACTION PHACO AND INTRAOCULAR LENS PLACEMENT (IOC);  Surgeon: Ronnell Freshwater, MD;  Location: [facility];  Service: Ophthalmology;  Laterality: Left;  LEFT  . [place]  2010  . KNEE ARTHROSCOPY Right 2004  . REPLACEMENT TOTAL KNEE Right 2009   [facility]  . SKIN GRAFT Left 04/08/2013   Done on left index finger  . TONSILLECTOMY  1946    Current Outpatient Medications  Medication Sig Dispense Refill  . apixaban (ELIQUIS) 5 MG TABS tablet Take 1 tablet (5 mg total) by mouth 2 (two) times daily. 60 tablet 0  . diltiazem (CARDIZEM CD) 300 MG 24 hr capsule Take 1 capsule (300 mg total) by mouth daily. 30 capsule 1  . furosemide (LASIX) 40 MG tablet Take 1 tablet (40 mg total) by mouth daily. 30 tablet 11  . metoprolol tartrate (LOPRESSOR) 25 MG tablet Take 1 tablet (25 mg total) by mouth 2 (two) times daily. 60 tablet 1  . Multiple Vitamin (MULTIVITAMIN) capsule Take 1 capsule by mouth daily.    . VENTOLIN HFA 108 (90 Base) MCG/ACT inhaler TAKE 1 TO 2 PUFFS EVERY 6 HOURS AS NEEDED FOR SHORTNESS OF BREATH 18 g 3  . apixaban (ELIQUIS) 5 MG TABS tablet Take 1 tablet (5 mg total) by mouth 2 (two) times daily. 180 tablet 3   No current facility-administered medications for this visit.     Allergies:   Levofloxacin; Influenza vaccines; and Oysters [shellfish allergy]   Social History:  The patient  reports that she quit smoking  about 29 years ago. Her smoking use included cigarettes. She has a 30.00 pack-year smoking history. She has never used smokeless tobacco. She reports current alcohol use of about 14.0 standard drinks of alcohol per week. She reports that she does not use drugs.   Family History:   family history includes Atrial fibrillation in her sister and sister; Breast cancer (age of onset: 31) in her sister; Healthy in her brother; Heart attack  in her father; Hyperlipidemia in her sister and sister; Transient ischemic attack in her mother.    Review of Systems: Review of Systems  Constitutional: Negative.   Respiratory: Negative.   Cardiovascular: Negative.   Gastrointestinal: Negative.   Musculoskeletal: Negative.   Neurological: Negative.   Psychiatric/Behavioral: Negative.   All other systems reviewed and are negative.   PHYSICAL EXAM: VS:  BP 133/84 (BP Location: Left Arm, Patient Position: Sitting, Cuff Size: Normal)   Pulse 69   Ht 5\' 8"  (1.727 m)   Wt 216 lb (98 kg)   BMI 32.84 kg/m  , BMI Body mass index is 32.84 kg/m. GEN: Well nourished, well developed, in no acute distress  HEENT: normal  Neck: no JVD, carotid bruits, or masses Cardiac: Irregularly irregular,  no murmurs, rubs, or gallops, trace pitting lower extremity edema to the mid shins Respiratory:  clear to auscultation bilaterally, normal work of breathing GI: soft, nontender, nondistended, + BS MS: no deformity or atrophy  Skin: warm and dry, no rash Neuro:  Strength and sensation are intact Psych: euthymic mood, full affect  Recent Labs: 05/03/2018: B Natriuretic Peptide 376.0 05/04/2018: ALT 62; Magnesium 2.6; TSH 1.615 05/05/2018: BUN 20; Creatinine, Ser 0.85; Hemoglobin 13.2; Platelets 247; Potassium 3.9; Sodium 138    Lipid Panel Lab Results  Component Value Date   CHOL 169 05/04/2018   HDL 54 05/04/2018   LDLCALC 97 05/04/2018   TRIG 88 05/04/2018      Wt Readings from Last 3 Encounters:  05/11/18 216 lb (98 kg)  05/06/18 206 lb 5.6 oz (93.6 kg)  05/03/18 218 lb 12.8 oz (99.2 kg)       ASSESSMENT AND PLAN:  Paroxysmal atrial fibrillation (HCC) - Plan: EKG 12-Lead Rate relatively well controlled Discussed side effects of diltiazem including lower extremity edema She will watch for worsening leg swelling If swelling gets worse we will increase the metoprolol and decrease the dose of diltiazem  Chronic diastolic CHF  (congestive heart failure) (HCC) Exacerbated by arrhythmia Recommended Lasix 40 daily Add potassium 20 daily Suggest extra 40 in the afternoon for worsening shortness of breath, abdominal swelling, weight gain, leg edema  Leg edema Recommended compression hose If leg swelling gets worse we will decrease diltiazem dosing and increase metoprolol  Smoker Reports prior history of smoking  Centrilobular emphysema (HCC) Recent upper respiratory infection, Reports every URI seems to last 6 weeks or more  Pulmonary hypertension, unspecified ([place]) Moderate pulmonary hypertension seen on prior echocardiogram We will continue Lasix 40 daily  We have added potassium 20 daily We will need BMP in follow-up  Disposition:   F/U  6 months   Total encounter time more than 45 minutes  Greater than 50% was spent in counseling and coordination of care with the patient    Orders Placed This Encounter  Procedures  . EKG 12-Lead     Signed, Esmond Plants, M.D., Ph.D. 05/11/2018  [place], [place]

## 2018-05-11 NOTE — Patient Instructions (Addendum)
Medication Instructions:   Please start potassium 10 meq Two pills  Take extra lasix after lunch for worsening shortness of breath  If you need a refill on your cardiac medications before your next appointment, please call your pharmacy.   Medication Samples have been provided to the patient.  Drug name: Eliquis       Strength: 5 mg        Qty: 4 boxes  LOT: [id number]  Exp.Date: 10/2020  Lab work: No new labs needed  If you have labs (blood work) drawn today and your tests are completely normal, you will receive your results only by: [name] Kitchen MyChart Message (if you have MyChart) OR . A paper copy in the mail If you have any lab test that is abnormal or we need to change your treatment, we will call you to review the results.   Testing/Procedures: No new testing needed  Follow-Up: At [facility], you and your health needs are our priority.  As part of our continuing mission to provide you with exceptional heart care, we have created designated Provider Care Teams.  These Care Teams include your primary Cardiologist (physician) and Advanced Practice Providers (APPs -  Physician Assistants and Nurse Practitioners) who all work together to provide you with the care you need, when you need it.  . You will need a follow up appointment in 3 to 4 weeks  . Providers on your designated Care Team:   . Murray Hodgkins, NP . Christell Faith, PA-C . Marrianne Mood, PA-C  Any Other Special Instructions Will Be Listed Below (If Applicable).  For educational health videos Log in to : [email or web address] Or : [email or web address], password : [email or web address]

## 2018-05-17 ENCOUNTER — Telehealth: Payer: Self-pay | Admitting: Cardiovascular Disease

## 2018-05-17 DIAGNOSIS — Z79899 Other long term (current) drug therapy: Secondary | ICD-10-CM

## 2018-05-17 DIAGNOSIS — I480 Paroxysmal atrial fibrillation: Secondary | ICD-10-CM

## 2018-05-17 MED ORDER — POTASSIUM CHLORIDE ER 10 MEQ PO TBCR: EXTENDED_RELEASE_TABLET | ORAL | 3 refills | Status: DC

## 2018-05-17 NOTE — Telephone Encounter (Signed)
Pt thinks one of her new medications has upset her stomach. Please call to discuss.

## 2018-05-17 NOTE — Telephone Encounter (Signed)
I left a message for the patient to call back 

## 2018-05-17 NOTE — Telephone Encounter (Signed)
I spoke with the patient. She states that prior to her hospitalization 2 weeks ago, she was not taking any prescription medications. She has been having large amounts of diarrhea today- 2 episodes that required her to change her clothes.  The patient reports "some" GI issues prior to her hospitalization.  Since then, she has been having some "squirty" issues, but no large amounts of diarrhea like today. She denies any nausea/ fever.  She states she also has not started potassium as her RX was not sent in to the pharmacy. I did confirm in her chart this was not added to her medication list as per Lauren Lloyd last office note on 05/11/18.   I have reviewed the above with Lauren Faith, PA. Most recent labs reviewed.  K+ 3.9 (12/21) K+ 2.9 (12.20)  Per Lauren Lloyd, [place]- GI issues most likely would have presented sooner if this was related to medications.  PA recommendations are to start potassium 10 meq- 2 tablets (20 meq) once daily as per Lauren Lloyd last note. She may take immodium PRN for diarrhea and monitor her symptoms.   The patient is scheduled to leave for a ski trip tomorrow. I have advised her to monitor her symptoms over the weekend.   Potassium RX sent to the pharmacy.

## 2018-05-17 NOTE — Telephone Encounter (Signed)
Pt is returning your call

## 2018-05-23 ENCOUNTER — Other Ambulatory Visit (INDEPENDENT_AMBULATORY_CARE_PROVIDER_SITE_OTHER): Payer: Medicare HMO | Admitting: *Deleted

## 2018-05-23 DIAGNOSIS — I480 Paroxysmal atrial fibrillation: Secondary | ICD-10-CM

## 2018-05-23 DIAGNOSIS — Z79899 Other long term (current) drug therapy: Secondary | ICD-10-CM

## 2018-05-24 ENCOUNTER — Encounter: Payer: Self-pay | Admitting: Family Medicine

## 2018-05-24 ENCOUNTER — Ambulatory Visit (INDEPENDENT_AMBULATORY_CARE_PROVIDER_SITE_OTHER): Payer: Medicare HMO | Admitting: Family Medicine

## 2018-05-24 VITALS — BP 127/81 | HR 75 | Temp 97.5°F | Wt 211.8 lb

## 2018-05-24 DIAGNOSIS — I480 Paroxysmal atrial fibrillation: Secondary | ICD-10-CM | POA: Diagnosis not present

## 2018-05-24 DIAGNOSIS — I5032 Chronic diastolic (congestive) heart failure: Secondary | ICD-10-CM

## 2018-05-24 LAB — BASIC METABOLIC PANEL
BUN/Creatinine Ratio: 18 (ref 12–28)
BUN: 18 mg/dL (ref 8–27)
CALCIUM: 9.7 mg/dL (ref 8.7–10.3)
CO2: 23 mmol/L (ref 20–29)
Chloride: 102 mmol/L (ref 96–106)
Creatinine, Ser: 0.98 mg/dL (ref 0.57–1.00)
GFR calc Af Amer: 64 mL/min/{1.73_m2} (ref >59)
GFR calc non Af Amer: 55 mL/min/{1.73_m2} — ABNORMAL LOW (ref >59)
Glucose: 102 mg/dL — ABNORMAL HIGH (ref 65–99)
Potassium: 4.3 mmol/L (ref 3.5–5.2)
Sodium: 142 mmol/L (ref 134–144)

## 2018-05-24 NOTE — Patient Instructions (Signed)
Atrial Fibrillation Atrial fibrillation is a type of irregular or rapid heartbeat (arrhythmia). In atrial fibrillation, the top part of the heart (atria) quivers in a chaotic pattern. This makes the heart unable to pump blood normally. Having atrial fibrillation can increase your risk for other health problems, such as:  Blood can pool in the atria and form clots. If a clot travels to the brain, it can cause a stroke.  The heart muscle may weaken from the irregular blood flow. This can cause heart failure. Atrial fibrillation may start suddenly and stop on its own, or it may become a long-lasting problem. What are the causes? This condition is caused by some heart-related conditions or procedures, including:  High blood pressure. This is the most common cause.  Heart failure.  Heart valve conditions.  Inflammation of the sac that surrounds the heart (pericarditis).  Heart surgery.  Coronary artery disease.  Certain heart rhythm disorders, such as Wolf-Parkinson-White syndrome. Other causes include:  Pneumonia.  Obstructive sleep apnea.  Lung cancer.  Thyroid problems, especially if the thyroid is overactive (hyperthyroidism).  Excessive alcohol or drug use. Sometimes, the cause of this condition is not known. What increases the risk? This condition is more likely to develop in:  Older people.  People who smoke.  People who have diabetes mellitus.  People who are overweight (obese).  Athletes who exercise vigorously.  People who have a family history. What are the signs or symptoms? Symptoms of this condition include:  A feeling that your heart is beating rapidly or irregularly.  A feeling of discomfort or pain in your chest.  Shortness of breath.  Sudden light-headedness or weakness.  Getting tired easily during exercise. In some cases, there are no symptoms. How is this diagnosed? Your health care provider may be able to detect atrial fibrillation when  taking your pulse. If detected, this condition may be diagnosed with:  Electrocardiogram (ECG).  Ambulatory cardiac monitor. This device records your heartbeats for 24 hours or more.  Transthoracic echocardiogram (TTE) to evaluate how blood flows through your heart.  Transesophageal echocardiogram (TEE) to view more detailed images of your heart.  A stress test.  Imaging tests, such as a CT scan or chest X-ray.  Blood tests. How is this treated? This condition may be treated with:  Medicines to slow down the heart rate or bring the heart's rhythm back to normal.  Medicines to prevent blood clots from forming.  Electrical cardioversion. This delivers a low-energy shock to the heart to reset its rhythm.  Ablation. This procedure destroys the part of the heart tissue that sends abnormal signals.  Left atrial appendage occlusion/excision. This seals off a common place in the atria where blood clots can form (left atrial appendage). The goal of treatment is to prevent blood clots from forming and to keep your heart beating at a normal rate and rhythm. Treatment depends on underlying medical conditions and how you feel when you are experiencing fibrillation. Follow these instructions at home: Medicines  Take over-the counter and prescription medicines only as told by your health care provider.  If your health care provider prescribed a blood-thinning medicine (anticoagulant), take it exactly as told. Taking too much blood-thinning medicine can cause bleeding. Taking too little can enable a blood clot to form and travel to the brain, causing a stroke. Lifestyle      Do not use any products that contain nicotine or tobacco, such as cigarettes and e-cigarettes. If you need help quitting, ask your health   care provider.  Do not drink beverages that contain caffeine, such as coffee, soda, and tea.  Follow diet instructions as told by your health care provider.  Exercise regularly as  told by your health care provider.  Do not drink alcohol. General instructions  If you have obstructive sleep apnea, manage your condition as told by your health care provider.  Maintain a healthy weight. Do not use diet pills unless your health care provider approves. Diet pills may make heart problems worse.  Keep all follow-up visits as told by your health care provider. This is important. Contact a health care provider if you:  Notice a change in the rate, rhythm, or strength of your heartbeat.  Are taking an anticoagulant and you notice increased bruising.  Tire more easily when you exercise or exert yourself.  Have a sudden change in weight. Get help right away if you have:   Chest pain, abdominal pain, sweating, or weakness.  Difficulty breathing.  Blood in your vomit, stool (feces), or urine.  Any symptoms of a stroke. "BE FAST" is an easy way to remember the main warning signs of a stroke: ? B - Balance. Signs are dizziness, sudden trouble walking, or loss of balance. ? E - Eyes. Signs are trouble seeing or a sudden change in vision. ? F - Face. Signs are sudden weakness or numbness of the face, or the face or eyelid drooping on one side. ? A - Arms. Signs are weakness or numbness in an arm. This happens suddenly and usually on one side of the body. ? S - Speech. Signs are sudden trouble speaking, slurred speech, or trouble understanding what people say. ? T - Time. Time to call emergency services. Write down what time symptoms started.  Other signs of a stroke, such as: ? A sudden, severe headache with no known cause. ? Nausea or vomiting. ? Seizure. These symptoms may represent a serious problem that is an emergency. Do not wait to see if the symptoms will go away. Get medical help right away. Call your local emergency services (911 in the [place] Do not drive yourself to the hospital. Summary  Atrial fibrillation is a type of irregular or rapid heartbeat  (arrhythmia).  Symptoms include a feeling that your heart is beating fast or irregularly. In some cases, you may not have symptoms.  The condition is treated with medicines to slow down the heart rate or bring the heart's rhythm back to normal. You may also need blood-thinning medicines to prevent blood clots.  Get help right away if you have symptoms or signs of a stroke. This information is not intended to replace advice given to you by your health care provider. Make sure you discuss any questions you have with your health care provider. Document Released: 05/02/2005 Document Revised: 06/23/2017 Document Reviewed: 06/23/2017 Elsevier Interactive Patient Education  2019 [employer]  

## 2018-05-24 NOTE — Progress Notes (Signed)
Patient: Lauren Lloyd Female    DOB: 06/28/1940   78 y.o.   MRN: [id number] Visit Date: 05/25/2018  Today's Provider: Lavon Paganini, MD   Chief Complaint  Patient presents with  . Hospitalization Follow-up   Subjective:    I, Tiburcio Pea, CMA, am acting as a scribe for Lavon Paganini, MD.   HPI  Follow up Hospitalization  Patient was admitted to [facility] on 05/03/2018 and discharged on 05/06/2018. She was treated for A-Fib with RVR, diastolic CHF. Treatment for this included: START taking: apixaban (ELIQUIS) diltiazem (CARDIZEM CD) furosemide (LASIX) metoprolol tartrate (LOPRESSOR) STOP taking: hydrochlorothiazide  Telephone follow up was done on 05/07/2018 She reports good compliance with treatment. She reports this condition is Unchanged.  As it is unclear when A fib started, she will need to be on Eliquis for 1 month before considering cardioversion.  She is seeing Dr Rockey Situ regularly.  She is compliant with her medications, though she is having LE edema, SOB, and fatigue.  She does not know how to tell when she is in A fib. ------------------------------------------------------------------------------------    Allergies  Allergen Reactions  . Levofloxacin     Other reaction(s): Joint Pains Other reaction(s): Other (See Comments) Joint pain  . Influenza Vaccines Other (See Comments)    Bell's Palsy  . Oysters [Shellfish Allergy] Swelling    She states she had eaten them three days in a row and she developed swelling around her eyes.      Current Outpatient Medications:  .  apixaban (ELIQUIS) 5 MG TABS tablet, Take 1 tablet (5 mg total) by mouth 2 (two) times daily., Disp: 180 tablet, Rfl: 3 .  diltiazem (CARDIZEM CD) 300 MG 24 hr capsule, Take 1 capsule (300 mg total) by mouth daily., Disp: 30 capsule, Rfl: 1 .  furosemide (LASIX) 40 MG tablet, Take 1 tablet (40 mg total) by mouth daily., Disp: 30 tablet, Rfl: 11 .  metoprolol tartrate (LOPRESSOR)  25 MG tablet, Take 1 tablet (25 mg total) by mouth 2 (two) times daily., Disp: 60 tablet, Rfl: 1 .  Multiple Vitamin (MULTIVITAMIN) capsule, Take 1 capsule by mouth daily., Disp: , Rfl:  .  potassium chloride (K-DUR) 10 MEQ tablet, Take 2 tablets (20 meq) by mouth once daily, Disp: 60 tablet, Rfl: 3 .  VENTOLIN HFA 108 (90 Base) MCG/ACT inhaler, TAKE 1 TO 2 PUFFS EVERY 6 HOURS AS NEEDED FOR SHORTNESS OF BREATH, Disp: 18 g, Rfl: 3  Review of Systems  Constitutional: Positive for fatigue. Negative for activity change, appetite change, chills, diaphoresis and fever.  HENT: Positive for congestion. Negative for dental problem, drooling, ear discharge, ear pain, facial swelling, hearing loss, mouth sores, nosebleeds, postnasal drip, rhinorrhea, sinus pressure, sinus pain, sneezing, sore throat, tinnitus, trouble swallowing and voice change.   Respiratory: Positive for shortness of breath. Negative for apnea, cough, choking, chest tightness, wheezing and stridor.   Cardiovascular: Positive for palpitations and leg swelling. Negative for chest pain.  Gastrointestinal: Negative.   Genitourinary: Negative.   Neurological: Negative.   Psychiatric/Behavioral: Negative.     Social History   Tobacco Use  . Smoking status: Former Smoker    Packs/day: 1.00    Years: 30.00    Pack years: 30.00    Types: Cigarettes    Last attempt to quit: 05/16/1989    Years since quitting: 29.0  . Smokeless tobacco: Never Used  Substance Use Topics  . Alcohol use: Yes    Alcohol/week: 14.0 standard drinks  Types: 7 Glasses of wine, 7 Standard drinks or equivalent per week      Objective:   BP 127/81 (BP Location: Right Arm, Patient Position: Sitting, Cuff Size: Large)   Pulse 75   Temp (!) 97.5 F (36.4 C) (Oral)   Wt 211 lb 12.8 oz (96.1 kg)   BMI 32.20 kg/m  Vitals:   05/24/18 1108  BP: 127/81  Pulse: 75  Temp: (!) 97.5 F (36.4 C)  TempSrc: Oral  Weight: 211 lb 12.8 oz (96.1 kg)     Physical  Exam Vitals signs reviewed.  Constitutional:      General: She is not in acute distress.    Appearance: Normal appearance.  HENT:     Head: Normocephalic and atraumatic.     Right Ear: External ear normal.     Left Ear: External ear normal.     Nose: Nose normal.     Mouth/Throat:     Pharynx: Oropharynx is clear.  Eyes:     General: No scleral icterus.    Conjunctiva/sclera: Conjunctivae normal.  Neck:     Musculoskeletal: Neck supple.  Cardiovascular:     Rate and Rhythm: Normal rate. Rhythm irregularly irregular.     Pulses: Normal pulses.     Heart sounds: No murmur.  Pulmonary:     Effort: Pulmonary effort is normal. No respiratory distress.     Breath sounds: Normal breath sounds. No wheezing or rales.  Abdominal:     General: There is no distension.     Palpations: Abdomen is soft.     Tenderness: There is no abdominal tenderness.  Musculoskeletal:     Right lower leg: Edema present.     Left lower leg: Edema present.  Lymphadenopathy:     Cervical: No cervical adenopathy.  Skin:    General: Skin is warm and dry.     Capillary Refill: Capillary refill takes less than 2 seconds.     Findings: No rash.  Neurological:     Mental Status: She is alert and oriented to person, place, and time. Mental status is at baseline.  Psychiatric:        Mood and Affect: Mood normal.        Behavior: Behavior normal.         Assessment & Plan   Problem List Items Addressed This Visit      Cardiovascular and Mediastinum   A-fib ([place]) - Primary    Unknown duration Rate controlled currently Doing well on Eliquis Discussed that Beta blocker may be contributing to fatigue, but expect her to adjust to this over time No changes to medications      Chronic diastolic CHF (congestive heart failure) (HCC)    New problem Likely 2/2 a fib with RVR of unknown duration Recent hospitalization Lungs are clear without crackles today, but she does continue to have LE edema Some of  her LE edema may be related to diltiazem also, so will need to monitor Reviewed BMP and K is stable Continue KDur at current dose Take Lasix 40mg  twice daily more consistently  Has f/u within next month with Dr Rockey Situ          Return in about 4 weeks (around 06/21/2018) for GI f/u.  Patient has been having fecal incontinence that has been evaluated by GI.  She wanted to discuss this at last visit, but was profoundly SOB and in A fib with RVR and was sent to the hospital for admission instead.  Approximately 25 minutes was spent in discussion of which greater than 50% was consultation.    [name], [name]. Portions of this information were initially documented by Tiburcio Pea, CMA and reviewed by me for thoroughness and accuracy.    [name], MD, MPH [facility] 05/25/2018 8:17 AM

## 2018-05-25 NOTE — Assessment & Plan Note (Signed)
New problem Likely 2/2 a fib with RVR of unknown duration Recent hospitalization Lungs are clear without crackles today, but she does continue to have LE edema Some of her LE edema may be related to diltiazem also, so will need to monitor Reviewed BMP and K is stable Continue KDur at current dose Take Lasix 40mg  twice daily more consistently  Has f/u within next month with Dr Rockey Situ

## 2018-05-25 NOTE — Assessment & Plan Note (Addendum)
Unknown duration Rate controlled currently Doing well on Eliquis Discussed that Beta blocker may be contributing to fatigue, but expect her to adjust to this over time No changes to medications

## 2018-05-30 ENCOUNTER — Emergency Department: Payer: Medicare HMO

## 2018-05-30 ENCOUNTER — Telehealth: Payer: Self-pay

## 2018-05-30 ENCOUNTER — Telehealth: Payer: Self-pay | Admitting: Physician Assistant

## 2018-05-30 ENCOUNTER — Other Ambulatory Visit: Payer: Self-pay

## 2018-05-30 ENCOUNTER — Observation Stay: Payer: Medicare HMO

## 2018-05-30 ENCOUNTER — Observation Stay
Admission: EM | Admit: 2018-05-30 | Discharge: 2018-05-31 | Disposition: A | Discharge status: Home / Self Care | Payer: Medicare HMO | Attending: Specialist | Admitting: Specialist

## 2018-05-30 DIAGNOSIS — I499 Cardiac arrhythmia, unspecified: Secondary | ICD-10-CM | POA: Diagnosis not present

## 2018-05-30 DIAGNOSIS — I4891 Unspecified atrial fibrillation: Secondary | ICD-10-CM | POA: Diagnosis not present

## 2018-05-30 DIAGNOSIS — M19042 Primary osteoarthritis, left hand: Secondary | ICD-10-CM | POA: Diagnosis not present

## 2018-05-30 DIAGNOSIS — Z87891 Personal history of nicotine dependence: Secondary | ICD-10-CM | POA: Diagnosis not present

## 2018-05-30 DIAGNOSIS — Z96651 Presence of right artificial knee joint: Secondary | ICD-10-CM | POA: Diagnosis not present

## 2018-05-30 DIAGNOSIS — I081 Rheumatic disorders of both mitral and tricuspid valves: Secondary | ICD-10-CM | POA: Insufficient documentation

## 2018-05-30 DIAGNOSIS — R202 Paresthesia of skin: Secondary | ICD-10-CM | POA: Diagnosis not present

## 2018-05-30 DIAGNOSIS — Z66 Do not resuscitate: Secondary | ICD-10-CM | POA: Insufficient documentation

## 2018-05-30 DIAGNOSIS — Z888 Allergy status to other drugs, medicaments and biological substances status: Secondary | ICD-10-CM | POA: Insufficient documentation

## 2018-05-30 DIAGNOSIS — Z8249 Family history of ischemic heart disease and other diseases of the circulatory system: Secondary | ICD-10-CM | POA: Insufficient documentation

## 2018-05-30 DIAGNOSIS — R200 Anesthesia of skin: Secondary | ICD-10-CM

## 2018-05-30 DIAGNOSIS — G459 Transient cerebral ischemic attack, unspecified: Secondary | ICD-10-CM | POA: Diagnosis not present

## 2018-05-30 DIAGNOSIS — Z881 Allergy status to other antibiotic agents status: Secondary | ICD-10-CM | POA: Insufficient documentation

## 2018-05-30 DIAGNOSIS — Z7901 Long term (current) use of anticoagulants: Secondary | ICD-10-CM | POA: Diagnosis not present

## 2018-05-30 DIAGNOSIS — M1990 Unspecified osteoarthritis, unspecified site: Secondary | ICD-10-CM | POA: Diagnosis not present

## 2018-05-30 DIAGNOSIS — J90 Pleural effusion, not elsewhere classified: Secondary | ICD-10-CM | POA: Diagnosis not present

## 2018-05-30 DIAGNOSIS — M19041 Primary osteoarthritis, right hand: Secondary | ICD-10-CM | POA: Diagnosis not present

## 2018-05-30 DIAGNOSIS — M170 Bilateral primary osteoarthritis of knee: Secondary | ICD-10-CM | POA: Insufficient documentation

## 2018-05-30 DIAGNOSIS — Z79899 Other long term (current) drug therapy: Secondary | ICD-10-CM | POA: Insufficient documentation

## 2018-05-30 DIAGNOSIS — I700 Atherosclerosis of aorta: Secondary | ICD-10-CM | POA: Diagnosis not present

## 2018-05-30 DIAGNOSIS — I639 Cerebral infarction, unspecified: Secondary | ICD-10-CM

## 2018-05-30 DIAGNOSIS — M5030 Other cervical disc degeneration, unspecified cervical region: Secondary | ICD-10-CM | POA: Insufficient documentation

## 2018-05-30 DIAGNOSIS — K219 Gastro-esophageal reflux disease without esophagitis: Secondary | ICD-10-CM | POA: Diagnosis not present

## 2018-05-30 LAB — CBC
HCT: 41.7 % (ref 36.0–46.0)
Hemoglobin: 13.5 g/dL (ref 12.0–15.0)
MCH: 29.4 pg (ref 26.0–34.0)
MCHC: 32.4 g/dL (ref 30.0–36.0)
MCV: 90.8 fL (ref 80.0–100.0)
Platelets: 284 10*3/uL (ref 150–400)
RBC: 4.59 MIL/uL (ref 3.87–5.11)
RDW: 12.4 % (ref 11.5–15.5)
WBC: 9.2 10*3/uL (ref 4.0–10.5)
nRBC: 0 % (ref 0.0–0.2)

## 2018-05-30 LAB — PROTIME-INR
INR: 1.01
Prothrombin Time: 13.2 seconds (ref 11.4–15.2)

## 2018-05-30 LAB — BASIC METABOLIC PANEL
Anion gap: 11 (ref 5–15)
BUN: 24 mg/dL — ABNORMAL HIGH (ref 8–23)
CO2: 24 mmol/L (ref 22–32)
Calcium: 9.5 mg/dL (ref 8.9–10.3)
Chloride: 104 mmol/L (ref 98–111)
Creatinine, Ser: 0.76 mg/dL (ref 0.44–1.00)
GFR calc Af Amer: >60 mL/min (ref >60)
GFR calc non Af Amer: >60 mL/min (ref >60)
Glucose, Bld: 114 mg/dL — ABNORMAL HIGH (ref 70–99)
Potassium: 3.9 mmol/L (ref 3.5–5.1)
Sodium: 139 mmol/L (ref 135–145)

## 2018-05-30 LAB — TROPONIN I: Troponin I: <0.03 ng/mL (ref <0.03)

## 2018-05-30 MED ORDER — POTASSIUM CHLORIDE CRYS ER 10 MEQ PO TBCR
10.0000 meq | EXTENDED_RELEASE_TABLET | Freq: Every day | ORAL | Status: DC
  Administered 2018-05-30 – 2018-05-31 (×2): 10 meq via ORAL
  Filled 2018-05-30 (×4): qty 1

## 2018-05-30 MED ORDER — DILTIAZEM HCL 25 MG/5ML IV SOLN
5.0000 mg | Freq: Four times a day (QID) | INTRAVENOUS | Status: DC | PRN
  Filled 2018-05-30: qty 5

## 2018-05-30 MED ORDER — METOPROLOL TARTRATE 25 MG PO TABS
25.0000 mg | ORAL_TABLET | Freq: Two times a day (BID) | ORAL | Status: DC
  Administered 2018-05-30 – 2018-05-31 (×2): 25 mg via ORAL
  Filled 2018-05-30 (×2): qty 1

## 2018-05-30 MED ORDER — ACETAMINOPHEN 160 MG/5ML PO SOLN
650.0000 mg | ORAL | Status: DC | PRN
  Filled 2018-05-30: qty 20.3

## 2018-05-30 MED ORDER — ASPIRIN 300 MG RE SUPP: 300.0000 mg | Freq: Every day | RECTAL | Status: DC

## 2018-05-30 MED ORDER — ASPIRIN 325 MG PO TABS
325.0000 mg | ORAL_TABLET | Freq: Every day | ORAL | Status: DC
  Administered 2018-05-30 – 2018-05-31 (×2): 325 mg via ORAL
  Filled 2018-05-30 (×2): qty 1

## 2018-05-30 MED ORDER — ACETAMINOPHEN 325 MG PO TABS: 650.0000 mg | ORAL_TABLET | ORAL | Status: DC | PRN

## 2018-05-30 MED ORDER — STROKE: EARLY STAGES OF RECOVERY BOOK
Freq: Once | Status: AC
  Administered 2018-05-30: 22:00:00

## 2018-05-30 MED ORDER — DILTIAZEM HCL ER COATED BEADS 300 MG PO CP24
300.0000 mg | ORAL_CAPSULE | Freq: Every day | ORAL | Status: DC
  Administered 2018-05-31: 300 mg via ORAL
  Filled 2018-05-30: qty 1

## 2018-05-30 MED ORDER — ALBUTEROL SULFATE (2.5 MG/3ML) 0.083% IN NEBU: 3.0000 mL | INHALATION_SOLUTION | RESPIRATORY_TRACT | Status: DC | PRN

## 2018-05-30 MED ORDER — SODIUM CHLORIDE 0.9% FLUSH: 3.0000 mL | Freq: Once | INTRAVENOUS | Status: DC

## 2018-05-30 MED ORDER — FUROSEMIDE 40 MG PO TABS
40.0000 mg | ORAL_TABLET | Freq: Every day | ORAL | Status: DC
  Administered 2018-05-31: 09:00:00 40 mg via ORAL
  Filled 2018-05-30: qty 1

## 2018-05-30 MED ORDER — ADULT MULTIVITAMIN W/MINERALS CH
1.0000 | ORAL_TABLET | Freq: Every day | ORAL | Status: DC
  Administered 2018-05-31: 1 via ORAL
  Filled 2018-05-30: qty 1

## 2018-05-30 MED ORDER — ACETAMINOPHEN 650 MG RE SUPP: 650.0000 mg | RECTAL | Status: DC | PRN

## 2018-05-30 MED ORDER — APIXABAN 5 MG PO TABS
5.0000 mg | ORAL_TABLET | Freq: Two times a day (BID) | ORAL | Status: DC
  Administered 2018-05-30 – 2018-05-31 (×2): 5 mg via ORAL
  Filled 2018-05-30 (×2): qty 1

## 2018-05-30 MED ORDER — MULTIVITAMINS PO CAPS: 1.0000 | ORAL_CAPSULE | Freq: Every day | ORAL | Status: DC

## 2018-05-30 NOTE — ED Triage Notes (Addendum)
Pt comes via POV from home with c/o left arm pain and numbness. Pt states last night her heart was racing and the numbness started.  Pt denies chest pain. Pt negative VAN per First Nurse. Mini neuro negative. Pt states she was recently dx with Afib and has SOB because of that.

## 2018-05-30 NOTE — ED Provider Notes (Signed)
[facility] Emergency Department Provider Note    First MD Initiated Contact with Patient 05/30/18 1708     (approximate)  I have reviewed the triage vital signs and the nursing notes.   HISTORY  Chief Complaint Numbness    HPI ADARIA HOLE is a 78 y.o. female recent diagnosis of A. fib on Eliquis presents the ER with left hand paresthesia and weakness that she first noted last night.  States that she felt like her heart was racing and since   that time is noted tingling paresthesia left hand.  States that she having difficulty with grip or grabbing anything.  States that she is unable to get up by using her left hand due to tingling sensation and weakness.  Denies any facial droop or slurred speech.  States that she is been compliant with her medications.   Past Medical History:  Diagnosis Date  . A-fib ([place])   . Arthritis    knees, Hands  . GERD (gastroesophageal reflux disease)    Family History  Problem Relation Age of Onset  . Hyperlipidemia Sister   . Atrial fibrillation Sister   . Transient ischemic attack Mother   . Heart attack Father   . Healthy Brother   . Breast cancer Sister 69  . Hyperlipidemia Sister   . Atrial fibrillation Sister    Past Surgical History:  Procedure Laterality Date  . CATARACT EXTRACTION W/PHACO Right 01/25/2016   Procedure: CATARACT EXTRACTION PHACO AND INTRAOCULAR LENS PLACEMENT (IOC);  Surgeon: Ronnell Freshwater, MD;  Location: [place];  Service: Ophthalmology;  Laterality: Right;  RIGHT  . CATARACT EXTRACTION W/PHACO Left 02/22/2016   Procedure: CATARACT EXTRACTION PHACO AND INTRAOCULAR LENS PLACEMENT (IOC);  Surgeon: Ronnell Freshwater, MD;  Location: [place];  Service: Ophthalmology;  Laterality: Left;  LEFT  . [place]  2010  . KNEE ARTHROSCOPY Right 2004  . REPLACEMENT TOTAL KNEE Right 2009   [facility]  . SKIN GRAFT Left 04/08/2013   Done on left index finger  . TONSILLECTOMY  1946   Patient Active Problem List   Diagnosis Date Noted  . Paresthesias 05/30/2018  . TIA (transient ischemic attack) 05/30/2018  . Chronic diastolic CHF (congestive heart failure) ([place]) 05/11/2018  . Centrilobular emphysema ([place]) 05/11/2018  . Smoker 05/11/2018  . Leg edema 05/11/2018  . A-fib ([place]) 05/03/2018  . Hematuria 04/04/2017  . Leg cramps 04/04/2017  . Metatarsalgia of both feet 04/04/2017  . 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 Hypertension 01/03/2017  . Hemorrhoids 01/03/2017  . Healthcare maintenance 01/03/2017  . Chronic right shoulder pain 01/03/2017  . Pulmonary hypertension, unspecified ([place]) 04/02/2015  . Chest pain radiating to arm 02/17/2015  . Acid reflux 11/18/2014  . 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 Primary osteoarthritis of one hip 10/03/2011  . L-S radiculopathy 09/29/2011  . Arthritis of knee, degenerative 09/29/2011  . S/P knee replacement 09/29/2011  . Non-toxic uninodular goiter 06/20/2009  . Cervical pain 08/20/2008  . Cannot sleep 12/12/2007  . Hypercholesteremia 07/30/2007      Prior to Admission medications   Medication Sig Start Date End Date Taking? Authorizing Provider  apixaban (ELIQUIS) 5 MG TABS tablet Take 1 tablet (5 mg total) by mouth 2 (two) times daily. 05/11/18  Yes Minna Merritts, MD  diltiazem (CARDIZEM CD) 300 MG 24 hr capsule Take 1 capsule (300 mg total) by mouth daily. 05/06/18  Yes Fritzi Mandes, MD  furosemide (LASIX) 40  MG tablet Take 1 tablet (40 mg total) by mouth daily. 05/06/18 05/06/19 Yes Fritzi Mandes, MD  metoprolol tartrate (LOPRESSOR) 25 MG tablet Take 1 tablet (25 mg total) by mouth 2 (two) times daily. 05/06/18  Yes Fritzi Mandes, MD  Multiple Vitamin (MULTIVITAMIN) capsule Take 1 capsule by mouth daily.   Yes [provider]  potassium chloride (K-DUR) 10 MEQ tablet Take 2 tablets (20 meq) by mouth once daily 05/17/18  Yes Gollan, Kathlene November, MD    VENTOLIN HFA 108 (90 Base) MCG/ACT inhaler TAKE 1 TO 2 PUFFS EVERY 6 HOURS AS NEEDED FOR SHORTNESS OF BREATH Patient not taking: Reported on 05/30/2018 04/16/18   [name], MD    Allergies Levofloxacin; Influenza vaccines; and Oysters [shellfish allergy]    Social History Social History   Tobacco Use  . Smoking status: Former Smoker    Packs/day: 1.00    Years: 30.00    Pack years: 30.00    Types: Cigarettes    Last attempt to quit: 05/16/1989    Years since quitting: 29.0  . Smokeless tobacco: Never Used  Substance Use Topics  . Alcohol use: Yes    Alcohol/week: 14.0 standard drinks    Types: 7 Glasses of wine, 7 Standard drinks or equivalent per week  . Drug use: No    Review of Systems Patient denies headaches, rhinorrhea, blurry vision, numbness, shortness of breath, chest pain, edema, cough, abdominal pain, nausea, vomiting, diarrhea, dysuria, fevers, rashes or hallucinations unless otherwise stated above in HPI. ____________________________________________   PHYSICAL EXAM:  VITAL SIGNS: Vitals:   05/30/18 2016 05/30/18 2253  BP: 138/82 102/75  Pulse: 98 (!) 140  Resp: 18 18  Temp: 97.9 F (36.6 C) 98.2 F (36.8 C)  SpO2: 97% 96%    Constitutional: Alert and oriented.  Eyes: Conjunctivae are normal.  Head: Atraumatic. Nose: No congestion/rhinnorhea. Mouth/Throat: Mucous membranes are moist.   Neck: No stridor. Painless ROM.  Cardiovascular: irregularly irregular rhythm. Grossly normal heart sounds.  Good peripheral circulation. Respiratory: Normal respiratory effort.  No retractions. Lungs CTAB. Gastrointestinal: Soft and nontender. No distention. No abdominal bruits. No CVA tenderness. Genitourinary:  Musculoskeletal: No lower extremity tenderness nor edema.  No joint effusions. Neurologic:  Normal speech and language.  Cranial nerves are intact.  No facial droop.  Does have decreased grip strength to left upper extremity and decreased bicep  strength.  Does not have drift.  Normal extinction.  Decreased sensation to left hand to elbow subjectively.  No lower extremity weakness or paresthesia. Skin:  Skin is warm, dry and intact. No rash noted. Psychiatric: Mood and affect are normal. Speech and behavior are normal.  ____________________________________________   LABS (all labs ordered are listed, but only abnormal results are displayed)  Results for orders placed or performed during the hospital encounter of 05/30/18 (from the past 24 hour(s))  Basic metabolic panel     Status: Abnormal   Collection Time: 05/30/18  4:04 PM  Result Value Ref Range   Sodium 139 135 - 145 mmol/L   Potassium 3.9 3.5 - 5.1 mmol/L   Chloride 104 98 - 111 mmol/L   CO2 24 22 - 32 mmol/L   Glucose, Bld 114 (H) 70 - 99 mg/dL   BUN 24 (H) 8 - 23 mg/dL   Creatinine, Ser 0.76 0.44 - 1.00 mg/dL   Calcium 9.5 8.9 - 10.3 mg/dL   GFR calc non Af Amer >60 >60 mL/min   GFR calc Af Amer >60 >60 mL/min  Anion gap 11 5 - 15  CBC     Status: None   Collection Time: 05/30/18  4:04 PM  Result Value Ref Range   WBC 9.2 4.0 - 10.5 K/uL   RBC 4.59 3.87 - 5.11 MIL/uL   Hemoglobin 13.5 12.0 - 15.0 g/dL   HCT 41.7 36.0 - 46.0 %   MCV 90.8 80.0 - 100.0 fL   MCH 29.4 26.0 - 34.0 pg   MCHC 32.4 30.0 - 36.0 g/dL   RDW 12.4 11.5 - 15.5 %   Platelets 284 150 - 400 K/uL   nRBC 0.0 0.0 - 0.2 %  Troponin I - ONCE - STAT     Status: None   Collection Time: 05/30/18  4:04 PM  Result Value Ref Range   Troponin I <0.03 <0.03 ng/mL  Protime-INR (order if Patient is taking Coumadin / Warfarin)     Status: None   Collection Time: 05/30/18  4:04 PM  Result Value Ref Range   Prothrombin Time 13.2 11.4 - 15.2 seconds   INR 1.01    ____________________________________________  EKG My review and personal interpretation at Time: 16:03   Indication: lue weakness  Rate: 100  Rhythm: afib Axis: normal Other: normal intervals, no  stemi ____________________________________________  RADIOLOGY  I personally reviewed all radiographic images ordered to evaluate for the above acute complaints and reviewed radiology reports and findings.  These findings were personally discussed with the patient.  Please see medical record for radiology report.  ____________________________________________   PROCEDURES  Procedure(s) performed:  Procedures    Critical Care performed: no ____________________________________________   INITIAL IMPRESSION / ASSESSMENT AND PLAN / ED COURSE  Pertinent labs & imaging results that were available during my care of the patient were reviewed by me and considered in my medical decision making (see chart for details).   DDX: tia, cva, paresthesia, radiculopathy, embolism  Dashawna C Gladson is a 78 y.o. who presents to the ED with paresthesias and weakness as described above.  Patient is AFVSS in ED. Exam as above. Given current presentation have considered the above differential.  The patient will be placed on continuous pulse oximetry and telemetry for monitoring.  Laboratory evaluation will be sent to evaluate for the above complaints.      Clinical Course as of May 30 2320  Wed May 30, 2018  1823 Due to patient's persistent paresthesia and weakness of the left hand with recent diagnosis of A. fib will discuss case with hospitalist for admission for stroke work-up.   [PR]    Clinical Course User Index [PR] Merlyn Lot, MD     As part of my medical decision making, I reviewed the following data within the [facility] notes reviewed and incorporated, Labs reviewed, notes from prior ED visits.  ____________________________________________   FINAL CLINICAL IMPRESSION(S) / ED DIAGNOSES  Final diagnoses:  Paresthesias  Left sided numbness  Atrial fibrillation, unspecified type ([place])      NEW MEDICATIONS STARTED DURING THIS VISIT:  Current Discharge  Medication List       Note:  This document was prepared using Dragon voice recognition software and may include unintentional dictation errors.    Merlyn Lot, MD 05/30/18 2322

## 2018-05-30 NOTE — H&P (Signed)
[facility] [place] NAME: Lauren Lloyd    MR#:  [id number]  DATE OF BIRTH:  1940-09-20  DATE OF ADMISSION:  05/30/2018  PRIMARY CARE PHYSICIAN: [name], MD   REQUESTING/REFERRING PHYSICIAN:   CHIEF COMPLAINT:   Chief Complaint  Patient presents with  . Numbness    HISTORY OF PRESENT ILLNESS: Lauren Lloyd  is a 78 y.o. female with a known history of atrial fibrillation on Eliquis for anticoagulation, GERD, arthritis presented to the emergency room with numbness in the left upper extremity as well as weakness.  Patient noted the symptoms yesterday night.  She also complaints of pain in the left hand. She was worked up with CT head which showed no acute abnormality.  Hospitalist service was consulted for further care.  No complaints of any slurred speech, facial droop.  PAST MEDICAL HISTORY:   Past Medical History:  Diagnosis Date  . A-fib ([place])   . Arthritis    knees, Hands  . GERD (gastroesophageal reflux disease)     PAST SURGICAL HISTORY:  Past Surgical History:  Procedure Laterality Date  . CATARACT EXTRACTION W/PHACO Right 01/25/2016   Procedure: CATARACT EXTRACTION PHACO AND INTRAOCULAR LENS PLACEMENT (IOC);  Surgeon: Ronnell Freshwater, MD;  Location: [place];  Service: Ophthalmology;  Laterality: Right;  RIGHT  . CATARACT EXTRACTION W/PHACO Left 02/22/2016   Procedure: CATARACT EXTRACTION PHACO AND INTRAOCULAR LENS PLACEMENT (IOC);  Surgeon: Ronnell Freshwater, MD;  Location: [place];  Service: Ophthalmology;  Laterality: Left;  LEFT  . [place]  2010  . KNEE ARTHROSCOPY Right 2004  . REPLACEMENT TOTAL KNEE Right 2009   [facility]  . SKIN GRAFT Left 04/08/2013   Done on left index finger  . TONSILLECTOMY  1946    SOCIAL HISTORY:  Social History   Tobacco Use  . Smoking status: Former Smoker    Packs/day: 1.00    Years: 30.00    Pack  years: 30.00    Types: Cigarettes    Last attempt to quit: 05/16/1989    Years since quitting: 29.0  . Smokeless tobacco: Never Used  Substance Use Topics  . Alcohol use: Yes    Alcohol/week: 14.0 standard drinks    Types: 7 Glasses of wine, 7 Standard drinks or equivalent per week    FAMILY HISTORY:  Family History  Problem Relation Age of Onset  . Hyperlipidemia Sister   . Atrial fibrillation Sister   . Transient ischemic attack Mother   . Heart attack Father   . Healthy Brother   . Breast cancer Sister 28  . Hyperlipidemia Sister   . Atrial fibrillation Sister     DRUG ALLERGIES:  Allergies  Allergen Reactions  . Levofloxacin     Other reaction(s): Joint Pains Other reaction(s): Other (See Comments) Joint pain  . Influenza Vaccines Other (See Comments)    Bell's Palsy  . Oysters [Shellfish Allergy] Swelling    She states she had eaten them three days in a row and she developed swelling around her eyes.     REVIEW OF SYSTEMS:   CONSTITUTIONAL: No fever, fatigue or weakness.  EYES: No blurred or double vision.  EARS, NOSE, AND THROAT: No tinnitus or ear pain.  RESPIRATORY: No cough, shortness of breath, wheezing or hemoptysis.  CARDIOVASCULAR: No chest pain, orthopnea, edema.  GASTROINTESTINAL: No nausea, vomiting, diarrhea or abdominal pain.  GENITOURINARY: No dysuria, hematuria.  ENDOCRINE: No polyuria,  nocturia,  HEMATOLOGY: No anemia, easy bruising or bleeding SKIN: No rash or lesion. MUSCULOSKELETAL: No joint pain or arthritis.   NEUROLOGIC: No tingling Numbness and weakness left upper extremity PSYCHIATRY: No anxiety or depression.   MEDICATIONS AT HOME:  Prior to Admission medications   Medication Sig Start Date End Date Taking? Authorizing Provider  apixaban (ELIQUIS) 5 MG TABS tablet Take 1 tablet (5 mg total) by mouth 2 (two) times daily. 05/11/18  Yes Minna Merritts, MD  diltiazem (CARDIZEM CD) 300 MG 24 hr capsule Take 1 capsule (300 mg total)  by mouth daily. 05/06/18  Yes Fritzi Mandes, MD  furosemide (LASIX) 40 MG tablet Take 1 tablet (40 mg total) by mouth daily. 05/06/18 05/06/19 Yes Fritzi Mandes, MD  metoprolol tartrate (LOPRESSOR) 25 MG tablet Take 1 tablet (25 mg total) by mouth 2 (two) times daily. 05/06/18  Yes Fritzi Mandes, MD  Multiple Vitamin (MULTIVITAMIN) capsule Take 1 capsule by mouth daily.   Yes [provider]  potassium chloride (K-DUR) 10 MEQ tablet Take 2 tablets (20 meq) by mouth once daily 05/17/18  Yes Gollan, Kathlene November, MD  VENTOLIN HFA 108 (90 Base) MCG/ACT inhaler TAKE 1 TO 2 PUFFS EVERY 6 HOURS AS NEEDED FOR SHORTNESS OF BREATH Patient not taking: Reported on 05/30/2018 04/16/18   [name], MD      PHYSICAL EXAMINATION:   VITAL SIGNS: Blood pressure (!) 156/92, pulse 62, temperature 98.3 F (36.8 C), resp. rate 18, height 5\' 8"  (1.727 m), weight 96.1 kg, SpO2 96 %.  GENERAL:  78 y.o.-year-old patient lying in the bed with no acute distress.  EYES: Pupils equal, round, reactive to light and accommodation. No scleral icterus. Extraocular muscles intact.  HEENT: Head atraumatic, normocephalic. Oropharynx and nasopharynx clear.  NECK:  Supple, no jugular venous distention. No thyroid enlargement, no tenderness.  LUNGS: Normal breath sounds bilaterally, no wheezing, rales,rhonchi or crepitation. No use of accessory muscles of respiration.  CARDIOVASCULAR: S1, S2 normal. No murmurs, rubs, or gallops.  ABDOMEN: Soft, nontender, nondistended. Bowel sounds present. No organomegaly or mass.  EXTREMITIES: No pedal edema, cyanosis, or clubbing.  NEUROLOGIC: Cranial nerves II through XII are intact. Muscle strength 5/5 in all extremities. Sensation intact. Gait not checked.  PSYCHIATRIC: The patient is alert and oriented x 3.  SKIN: No obvious rash, lesion, or ulcer.   LABORATORY PANEL:   CBC Recent Labs  Lab 05/30/18 1604  WBC 9.2  HGB 13.5  HCT 41.7  PLT 284  MCV 90.8  MCH 29.4  MCHC  32.4  RDW 12.4   ------------------------------------------------------------------------------------------------------------------  Chemistries  Recent Labs  Lab 05/30/18 1604  NA 139  K 3.9  CL 104  CO2 24  GLUCOSE 114*  BUN 24*  CREATININE 0.76  CALCIUM 9.5   ------------------------------------------------------------------------------------------------------------------ estimated creatinine clearance is 70.3 mL/min (by C-G formula based on SCr of 0.76 mg/dL). ------------------------------------------------------------------------------------------------------------------ No results for input(s): TSH, T4TOTAL, T3FREE, THYROIDAB in the last 72 hours.  Invalid input(s): FREET3   Coagulation profile Recent Labs  Lab 05/30/18 1604  INR 1.01   ------------------------------------------------------------------------------------------------------------------- No results for input(s): DDIMER in the last 72 hours. -------------------------------------------------------------------------------------------------------------------  Cardiac Enzymes Recent Labs  Lab 05/30/18 1604  TROPONINI <0.03   ------------------------------------------------------------------------------------------------------------------ Invalid input(s): POCBNP  ---------------------------------------------------------------------------------------------------------------  Urinalysis    Component Value Date/Time   BILIRUBINUR Negative 03/06/2018 1001   PROTEINUR Negative 03/06/2018 1001   UROBILINOGEN 0.2 03/06/2018 1001   NITRITE Negative 03/06/2018 1001   LEUKOCYTESUR Negative 03/06/2018 1001  RADIOLOGY: Dg Chest 2 View  Result Date: 05/30/2018 CLINICAL DATA:  78 year old female with left hand numbness onset this morning. Atrial fibrillation. Shortness of breath. EXAM: CHEST - 2 VIEW COMPARISON:  05/03/2018 and earlier. FINDINGS: Small bilateral pleural effusions in December have  resolved. Stable cardiomegaly and mediastinal contours. Bilateral increased pulmonary interstitial opacity appears chronic. No pneumothorax or acute pulmonary opacity. No acute osseous abnormality identified. Abdominal Calcified aortic atherosclerosis. Negative visible bowel gas pattern. IMPRESSION: 1. Resolved small pleural effusion since December. No acute cardiopulmonary abnormality. 2. Aortic Atherosclerosis (ICD10-I70.0). Electronically Signed   By: Genevie Ann M.D.   On: 05/30/2018 16:39   Ct Head Wo Contrast  Result Date: 05/30/2018 CLINICAL DATA:  LEFT arm pain and numbness, heart racing last night, radiculopathy, focal neural deficit of greater than 6 hours question stroke EXAM: CT HEAD WITHOUT CONTRAST CT CERVICAL SPINE WITHOUT CONTRAST TECHNIQUE: Multidetector CT imaging of the head and cervical spine was performed following the standard protocol without intravenous contrast. Multiplanar CT image reconstructions of the cervical spine were also generated. COMPARISON:  None Correlation: Cervical spine radiographs 08/20/2008 FINDINGS: CT HEAD FINDINGS Brain: Mild age-related atrophy. Normal ventricular morphology. No midline shift or mass effect. Otherwise normal appearance of brain parenchyma. No intracranial hemorrhage, mass lesion or evidence acute infarction. No extra-axial fluid collections. Vascular: Minimal atherosclerotic calcification of internal carotid arteries at skull base. Skull: Intact Sinuses/Orbits: Clear Other: N/A CT CERVICAL SPINE FINDINGS Alignment: Minimal anterolisthesis C4-C5 unchanged. Remaining cervical spine alignment normal. Skull base and vertebrae: Osseous mineralization normal. Visualized skull base intact. Multilevel facet degenerative changes. Vertebral body heights maintained. No fracture, additional subluxation, or bone destruction. Soft tissues and spinal canal: Prevertebral soft tissues normal thickness. Asymmetric enlargement of the RIGHT thyroid lobe versus LEFT  question mass 3.5 cm diameter. Disc levels:  No additional abnormalities Upper chest: Visualized lung apices clear. Other: N/A IMPRESSION: No acute intracranial abnormalities. Degenerative facet disease changes of the cervical spine. No acute cervical spine abnormalities. Question RIGHT thyroid mass 3.5 cm diameter; ultrasound assessment recommended to exclude tumor. Electronically Signed   By: Lavonia Dana M.D.   On: 05/30/2018 18:00   Ct Cervical Spine Wo Contrast  Result Date: 05/30/2018 CLINICAL DATA:  LEFT arm pain and numbness, heart racing last night, radiculopathy, focal neural deficit of greater than 6 hours question stroke EXAM: CT HEAD WITHOUT CONTRAST CT CERVICAL SPINE WITHOUT CONTRAST TECHNIQUE: Multidetector CT imaging of the head and cervical spine was performed following the standard protocol without intravenous contrast. Multiplanar CT image reconstructions of the cervical spine were also generated. COMPARISON:  None Correlation: Cervical spine radiographs 08/20/2008 FINDINGS: CT HEAD FINDINGS Brain: Mild age-related atrophy. Normal ventricular morphology. No midline shift or mass effect. Otherwise normal appearance of brain parenchyma. No intracranial hemorrhage, mass lesion or evidence acute infarction. No extra-axial fluid collections. Vascular: Minimal atherosclerotic calcification of internal carotid arteries at skull base. Skull: Intact Sinuses/Orbits: Clear Other: N/A CT CERVICAL SPINE FINDINGS Alignment: Minimal anterolisthesis C4-C5 unchanged. Remaining cervical spine alignment normal. Skull base and vertebrae: Osseous mineralization normal. Visualized skull base intact. Multilevel facet degenerative changes. Vertebral body heights maintained. No fracture, additional subluxation, or bone destruction. Soft tissues and spinal canal: Prevertebral soft tissues normal thickness. Asymmetric enlargement of the RIGHT thyroid lobe versus LEFT question mass 3.5 cm diameter. Disc levels:  No  additional abnormalities Upper chest: Visualized lung apices clear. Other: N/A IMPRESSION: No acute intracranial abnormalities. Degenerative facet disease changes of the cervical spine. No acute cervical spine abnormalities. Question RIGHT  thyroid mass 3.5 cm diameter; ultrasound assessment recommended to exclude tumor. Electronically Signed   By: Lavonia Dana M.D.   On: 05/30/2018 18:00    EKG: Orders placed or performed during the hospital encounter of 05/30/18  . EKG 12-Lead  . EKG 12-Lead  . ED EKG  . ED EKG    IMPRESSION AND PLAN:  78 year old female patient with history of atrial fibrillation, GERD, arthritis presented to the emergency room for numbness and weakness in the left upper extremity.  -Transient ischemic attack Admit patient to medical floor Check MRI and MRA brain Carotid ultrasound and echocardiogram Neurology consult Start patient on aspirin  -Atrial fibrillation Continue rate control with diltiazem Continue oral Eliquis for anticoagulation  -Left hand pain could be secondary to arthritis Pain management  -DVT prophylaxis On anticoagulation with Eliquis  All the records are reviewed and case discussed with ED provider. Management plans discussed with the patient, family and they are in agreement.  CODE STATUS:DNR    Code Status Orders  (From admission, onward)         Start     Ordered   05/30/18 1905  Do not attempt resuscitation (DNR)  Continuous    Question Answer Comment  In the event of cardiac or respiratory ARREST Do not call a "code blue"   In the event of cardiac or respiratory ARREST Do not perform Intubation, CPR, defibrillation or ACLS   In the event of cardiac or respiratory ARREST Use medication by any route, position, wound care, and other measures to relive pain and suffering. May use oxygen, suction and manual treatment of airway obstruction as needed for comfort.      05/30/18 1904        Code Status History    Date Active  Date Inactive Code Status Order ID Comments User Context   05/03/2018 2148 05/06/2018 1446 Full Code [id number]  Sedalia Muta, MD Inpatient       TOTAL TIME TAKING CARE OF THIS PATIENT: 52 minutes.    Saundra Shelling M.D on 05/30/2018 at 7:05 PM  Between 7am to 6pm - Pager - [phone]  After 6pm go to [email or web address] - password [email or web address] [facility]  Office  [phone]  CC: Primary care physician; [name], MD

## 2018-05-30 NOTE — Telephone Encounter (Signed)
Can we get her in this afternoon to see someone?  This needs to be evaluated today as it is a new neurologic change

## 2018-05-30 NOTE — ED Notes (Signed)
Pt states she has not taken her BP medication today.

## 2018-05-30 NOTE — ED Notes (Signed)
VAN not done by this RN previously.  Done at this time and is negative.

## 2018-05-30 NOTE — Telephone Encounter (Signed)
Reviewed the patient's chart. She had already touched base with her PCP today. They advised evaluation today. Patient is currently in the ER for further evaluation and assessment of her symptoms.

## 2018-05-30 NOTE — Telephone Encounter (Signed)
Patient advised. She is going to try the UC and if they can not see her she will go to the ER.

## 2018-05-30 NOTE — ED Triage Notes (Signed)
FIRST NURSE NOTE-pt here for numbness to left hand and arm starting early this morning.  Hx afib.  Ambulatory. Declined wheelchair. Pulled next for triage for VAN assessment.

## 2018-05-30 NOTE — Telephone Encounter (Signed)
LMTCB. Patient needs to be seen today either here, UC, or ER per Dr. Brita Romp.

## 2018-05-30 NOTE — Progress Notes (Signed)
Advanced care plan.  Purpose of the Encounter: CODE STATUS  Parties in Attendance: Patient  Patient's Decision Capacity: Good  Subjective/Patient's story: Presented to emergency room with left upper extremity weakness and numbness   Objective/Medical story Needs evaluation and work-up for stroke   Goals of care determination:  Advance care directives goals of care and treatment plan discussed Patient does not want CPR, intubation and ventilator if the need arises   CODE STATUS: DNR   Time spent discussing advanced care planning: 16 minutes

## 2018-05-30 NOTE — Telephone Encounter (Signed)
Patient called saying that she is experiencing some pain and numbness in her left hand. She reports that she has had symptoms since last night. She denies any chest pain, nausea, blurred vision, or weakness in her hand. She describes pain as intermittent and sharp. She also reports that it is tender to touch as well. She denies any redness or swelling. Denies any injuries. Pain has also radiated up her arm, but only lasts a few mins.   She reports that she has been short of breath, but does not feel it has changed from her baseline. She reports that she was just recently diagnosed with A-fib. She has also called her cardiologist today, but she has not heard back from them yet. Please review. Thanks!

## 2018-05-30 NOTE — Telephone Encounter (Signed)
Pt states she is experiencing pain in her left hand, and then it will go numb. States this radiates into her arm . Denies any CP, SOB, nausea, dizziness. Please call to discuss.

## 2018-05-30 NOTE — Telephone Encounter (Signed)
Call [phone]. Thanks!

## 2018-05-31 ENCOUNTER — Telehealth: Payer: Self-pay | Admitting: Family Medicine

## 2018-05-31 ENCOUNTER — Observation Stay: Payer: Medicare HMO

## 2018-05-31 ENCOUNTER — Observation Stay
Admit: 2018-05-31 | Discharge: 2018-05-31 | Disposition: A | Discharge status: Home / Self Care | Payer: Medicare HMO | Attending: Internal Medicine | Admitting: Internal Medicine

## 2018-05-31 DIAGNOSIS — M1990 Unspecified osteoarthritis, unspecified site: Secondary | ICD-10-CM | POA: Diagnosis not present

## 2018-05-31 DIAGNOSIS — I6523 Occlusion and stenosis of bilateral carotid arteries: Secondary | ICD-10-CM | POA: Diagnosis not present

## 2018-05-31 DIAGNOSIS — G459 Transient cerebral ischemic attack, unspecified: Secondary | ICD-10-CM | POA: Diagnosis not present

## 2018-05-31 DIAGNOSIS — K219 Gastro-esophageal reflux disease without esophagitis: Secondary | ICD-10-CM | POA: Diagnosis not present

## 2018-05-31 DIAGNOSIS — I4891 Unspecified atrial fibrillation: Secondary | ICD-10-CM | POA: Diagnosis not present

## 2018-05-31 LAB — LIPID PANEL
Cholesterol: 180 mg/dL (ref 0–200)
HDL: 50 mg/dL (ref >40)
LDL Cholesterol: 109 mg/dL — ABNORMAL HIGH (ref 0–99)
Total CHOL/HDL Ratio: 3.6 RATIO
Triglycerides: 107 mg/dL (ref <150)
VLDL: 21 mg/dL (ref 0–40)

## 2018-05-31 LAB — ECHOCARDIOGRAM COMPLETE
Height: 68 in
Weight: 3388.81 oz

## 2018-05-31 NOTE — Evaluation (Signed)
Physical Therapy Evaluation Patient Details Name: Lauren Lloyd MRN: [id number] DOB: Apr 04, 1941 Today's Date: 05/31/2018   History of Present Illness  Patient is a 78 year old female admitted from home with L sided UE numbness, weakness. MRI negative. PMH to include Afib recently diagnosed, GERD, OA.     Clinical Impression  Patient received sitting up on side of bed. Reports she continues to have some L UE weakness, numbness but it has improved since here. MRI negative. Possibly a nerve impingement problem. Patient demonstrates independence with bed mobility and transfers, able to ambulate 2 x around nurses station without AD, no LOB or difficulty other than mod SOB reported. O2 sats 96%, but she rates 6/10 on RPE. Patient does not need continued PT while inpatient and is cleared to move about as she wishes.     Follow Up Recommendations No PT follow up    Equipment Recommendations  None recommended by PT    Recommendations for Other Services       Precautions / Restrictions Precautions Precautions: None Restrictions Weight Bearing Restrictions: No      Mobility  Bed Mobility Overal bed mobility: Independent                Transfers Overall transfer level: Independent                  Ambulation/Gait Ambulation/Gait assistance: Independent Gait Distance (Feet): 325 Feet Assistive device: None Gait Pattern/deviations: WFL(Within Functional Limits)     General Gait Details: slight SOB reported after ambulation 6/10 on RPE, O2 sats at 96%  Stairs            Wheelchair Mobility    Modified Rankin (Stroke Patients Only)       Balance Overall balance assessment: Independent                                           Pertinent Vitals/Pain Pain Assessment: No/denies pain    Home Living Family/patient expects to be discharged to:: Private residence Living Arrangements: Alone Available Help at Discharge: Available  PRN/intermittently;Family Type of Home: House Home Access: Stairs to enter   Technical brewer of Steps: 3 Home Layout: One level Home Equipment: None      Prior Function                 Hand Dominance        Extremity/Trunk Assessment   Upper Extremity Assessment Upper Extremity Assessment: LUE deficits/detail LUE Sensation: decreased light touch    Lower Extremity Assessment Lower Extremity Assessment: Overall WFL for tasks assessed       Communication   Communication: No difficulties  Cognition Arousal/Alertness: Awake/alert Behavior During Therapy: WFL for tasks assessed/performed Overall Cognitive Status: Within Functional Limits for tasks assessed                                        General Comments      Exercises     Assessment/Plan    PT Assessment Patent does not need any further PT services  PT Problem List         PT Treatment Interventions      PT Goals (Current goals can be found in the Care Plan section)  Acute Rehab PT Goals Patient Stated Goal: to  go home today PT Goal Formulation: With patient Time For Goal Achievement: 06/14/18 Potential to Achieve Goals: Good    Frequency     Barriers to discharge        Co-evaluation               AM-PAC PT "6 Clicks" Mobility  Outcome Measure Help needed turning from your back to your side while in a flat bed without using bedrails?: None Help needed moving from lying on your back to sitting on the side of a flat bed without using bedrails?: None Help needed moving to and from a bed to a chair (including a wheelchair)?: None Help needed standing up from a chair using your arms (e.g., wheelchair or bedside chair)?: None Help needed to walk in hospital room?: None Help needed climbing 3-5 steps with a railing? : None 6 Click Score: 24    End of Session Equipment Utilized During Treatment: Gait belt Activity Tolerance: Patient tolerated treatment  well Patient left: in bed;with call bell/phone within reach Nurse Communication: Mobility status      Time: 1145-1200 PT Time Calculation (min) (ACUTE ONLY): 15 min   Charges:   PT Evaluation $PT Eval Low Complexity: 1 Low          Asia Favata, PT, GCS 05/31/18,12:08 PM

## 2018-05-31 NOTE — Telephone Encounter (Signed)
[place] calling to make a 1 week follow up from today's date for a Hospital F/U with Dr. B.  Please call pt back to work her in.  Nothing currently open to schedule.  Thanks, [employer]

## 2018-05-31 NOTE — Discharge Summary (Signed)
[place] [facility] [place] NAME: Lauren Lloyd    MR#:  [id number]  DATE OF BIRTH:  1940/12/15  DATE OF ADMISSION:  05/30/2018 ADMITTING PHYSICIAN: Lauren Shelling, MD  DATE OF DISCHARGE: 05/31/2018  2:04 PM  PRIMARY CARE PHYSICIAN: Lauren Crews, MD    ADMISSION DIAGNOSIS:  Paresthesias [R20.2] Left sided numbness [R20.0] Atrial fibrillation, unspecified type ([place]) [I48.91]  DISCHARGE DIAGNOSIS:  Active Problems:   Paresthesias   TIA (transient ischemic attack)   SECONDARY DIAGNOSIS:   Past Medical History:  Diagnosis Date  . A-fib ([place])   . Arthritis    knees, Hands  . GERD (gastroesophageal reflux disease)     HOSPITAL COURSE:   78 year old female history of atrial fibrillation, osteoarthritis, GERD who presented to the hospital due to left upper extremity numbness and paresthesias.  1.  TIA/CVA- this was the working diagnosis given the patient's left upper extremity paresthesias and numbness. -Patient underwent an extensive neurologic work-up including CT head, MRI of the brain which was negative for acute pathology.  Patient also had a carotid duplex which showed no hemodynamically significant carotid artery stenosis. -Seen by neurology and they do not think that the patient's symptoms are related to a intracranial process/stroke but rather radiculopathy or even underlying peripheral neuropathy.  The recommended outpatient follow-up with neurology and possible EMG if symptoms persist.  Patient symptoms are actually improved in the hospital. -She is therefore being discharged home.  2.  History of atrial fibrillation- patient will continue her Eliquis, she remained rate controlled she will continue Cardizem, metoprolol.  DISCHARGE CONDITIONS:   Stable.   CONSULTS OBTAINED:  Treatment Team:  Lauren Natal, MD Lauren Hartshorn, MD  DRUG ALLERGIES:   Allergies  Allergen Reactions  . Levofloxacin     Other reaction(s):  Joint Pains Other reaction(s): Other (See Comments) Joint pain  . Influenza Vaccines Other (See Comments)    Bell's Palsy  . Oysters [Shellfish Allergy] Swelling    She states she had eaten them three days in a row and she developed swelling around her eyes.     DISCHARGE MEDICATIONS:   Allergies as of 05/31/2018      Reactions   Levofloxacin    Other reaction(s): Joint Pains Other reaction(s): Other (See Comments) Joint pain   Influenza Vaccines Other (See Comments)   Bell's Palsy   Oysters [shellfish Allergy] Swelling   She states she had eaten them three days in a row and she developed swelling around her eyes.       Medication List    TAKE these medications   apixaban 5 MG Tabs tablet Commonly known as:  ELIQUIS Take 1 tablet (5 mg total) by mouth 2 (two) times daily.   diltiazem 300 MG 24 hr capsule Commonly known as:  CARDIZEM CD Take 1 capsule (300 mg total) by mouth daily.   furosemide 40 MG tablet Commonly known as:  LASIX Take 1 tablet (40 mg total) by mouth daily.   metoprolol tartrate 25 MG tablet Commonly known as:  LOPRESSOR Take 1 tablet (25 mg total) by mouth 2 (two) times daily.   multivitamin capsule Take 1 capsule by mouth daily.   potassium chloride 10 MEQ tablet Commonly known as:  K-DUR Take 2 tablets (20 meq) by mouth once daily   VENTOLIN HFA 108 (90 Base) MCG/ACT inhaler Generic drug:  albuterol TAKE 1 TO 2 PUFFS EVERY 6 HOURS AS NEEDED FOR SHORTNESS OF BREATH  DISCHARGE INSTRUCTIONS:   DIET:  Cardiac diet  DISCHARGE CONDITION:  Stable  ACTIVITY:  Activity as tolerated  OXYGEN:  Home Oxygen: No.   Oxygen Delivery: room air  DISCHARGE LOCATION:  home   If you experience worsening of your admission symptoms, develop shortness of breath, life threatening emergency, suicidal or homicidal thoughts you must seek medical attention immediately by calling 911 or calling your MD immediately  if symptoms less  severe.  You Must read complete instructions/literature along with all the possible adverse reactions/side effects for all the Medicines you take and that have been prescribed to you. Take any new Medicines after you have completely understood and accpet all the possible adverse reactions/side effects.   Please note  You were cared for by a hospitalist during your hospital stay. If you have any questions about your discharge medications or the care you received while you were in the hospital after you are discharged, you can call the unit and asked to speak with the hospitalist on call if the hospitalist that took care of you is not available. Once you are discharged, your primary care physician will handle any further medical issues. Please note that NO REFILLS for any discharge medications will be authorized once you are discharged, as it is imperative that you return to your primary care physician (or establish a relationship with a primary care physician if you do not have one) for your aftercare needs so that they can reassess your need for medications and monitor your lab values.     Today   Left upper extremity paresthesias much improved since admission.  Neurologic imaging is negative.  Patient is going to be discharged home with outpatient neurology follow-up.  VITAL SIGNS:  Blood pressure 112/63, pulse 84, temperature 97.8 F (36.6 C), temperature source Oral, resp. rate 17, height 5\' 8"  (1.727 m), weight 96.1 kg, SpO2 96 %.  I/O:    Intake/Output Summary (Last 24 hours) at 05/31/2018 1607 Last data filed at 05/31/2018 0900 Gross per 24 hour  Intake 240 ml  Output -  Net 240 ml    PHYSICAL EXAMINATION:  GENERAL:  78 y.o.-year-old patient lying in the bed with no acute distress.  EYES: Pupils equal, round, reactive to light and accommodation. No scleral icterus. Extraocular muscles intact.  HEENT: Head atraumatic, normocephalic. Oropharynx and nasopharynx clear.  NECK:   Supple, no jugular venous distention. No thyroid enlargement, no tenderness.  LUNGS: Normal breath sounds bilaterally, no wheezing, rales,rhonchi. No use of accessory muscles of respiration.  CARDIOVASCULAR: S1, S2 normal. No murmurs, rubs, or gallops.  ABDOMEN: Soft, non-tender, non-distended. Bowel sounds present. No organomegaly or mass.  EXTREMITIES: No pedal edema, cyanosis, or clubbing.  NEUROLOGIC: Cranial nerves II through XII are intact. No focal motor or sensory defecits b/l.  PSYCHIATRIC: The patient is alert and oriented x 3.  SKIN: No obvious rash, lesion, or ulcer.   DATA REVIEW:   CBC Recent Labs  Lab 05/30/18 1604  WBC 9.2  HGB 13.5  HCT 41.7  PLT 284    Chemistries  Recent Labs  Lab 05/30/18 1604  NA 139  K 3.9  CL 104  CO2 24  GLUCOSE 114*  BUN 24*  CREATININE 0.76  CALCIUM 9.5    Cardiac Enzymes Recent Labs  Lab 05/30/18 1604  TROPONINI <0.03    RADIOLOGY:  Dg Chest 2 View  Result Date: 05/30/2018 CLINICAL DATA:  78 year old female with left hand numbness onset this morning. Atrial fibrillation. Shortness of breath.  EXAM: CHEST - 2 VIEW COMPARISON:  05/03/2018 and earlier. FINDINGS: Small bilateral pleural effusions in December have resolved. Stable cardiomegaly and mediastinal contours. Bilateral increased pulmonary interstitial opacity appears chronic. No pneumothorax or acute pulmonary opacity. No acute osseous abnormality identified. Abdominal Calcified aortic atherosclerosis. Negative visible bowel gas pattern. IMPRESSION: 1. Resolved small pleural effusion since December. No acute cardiopulmonary abnormality. 2. Aortic Atherosclerosis (ICD10-I70.0). Electronically Signed   By: Genevie Ann M.D.   On: 05/30/2018 16:39   Ct Head Wo Contrast  Result Date: 05/30/2018 CLINICAL DATA:  LEFT arm pain and numbness, heart racing last night, radiculopathy, focal neural deficit of greater than 6 hours question stroke EXAM: CT HEAD WITHOUT CONTRAST CT CERVICAL  SPINE WITHOUT CONTRAST TECHNIQUE: Multidetector CT imaging of the head and cervical spine was performed following the standard protocol without intravenous contrast. Multiplanar CT image reconstructions of the cervical spine were also generated. COMPARISON:  None Correlation: Cervical spine radiographs 08/20/2008 FINDINGS: CT HEAD FINDINGS Brain: Mild age-related atrophy. Normal ventricular morphology. No midline shift or mass effect. Otherwise normal appearance of brain parenchyma. No intracranial hemorrhage, mass lesion or evidence acute infarction. No extra-axial fluid collections. Vascular: Minimal atherosclerotic calcification of internal carotid arteries at skull base. Skull: Intact Sinuses/Orbits: Clear Other: N/A CT CERVICAL SPINE FINDINGS Alignment: Minimal anterolisthesis C4-C5 unchanged. Remaining cervical spine alignment normal. Skull base and vertebrae: Osseous mineralization normal. Visualized skull base intact. Multilevel facet degenerative changes. Vertebral body heights maintained. No fracture, additional subluxation, or bone destruction. Soft tissues and spinal canal: Prevertebral soft tissues normal thickness. Asymmetric enlargement of the RIGHT thyroid lobe versus LEFT question mass 3.5 cm diameter. Disc levels:  No additional abnormalities Upper chest: Visualized lung apices clear. Other: N/A IMPRESSION: No acute intracranial abnormalities. Degenerative facet disease changes of the cervical spine. No acute cervical spine abnormalities. Question RIGHT thyroid mass 3.5 cm diameter; ultrasound assessment recommended to exclude tumor. Electronically Signed   By: Lavonia Dana M.D.   On: 05/30/2018 18:00   Ct Cervical Spine Wo Contrast  Result Date: 05/30/2018 CLINICAL DATA:  LEFT arm pain and numbness, heart racing last night, radiculopathy, focal neural deficit of greater than 6 hours question stroke EXAM: CT HEAD WITHOUT CONTRAST CT CERVICAL SPINE WITHOUT CONTRAST TECHNIQUE: Multidetector CT  imaging of the head and cervical spine was performed following the standard protocol without intravenous contrast. Multiplanar CT image reconstructions of the cervical spine were also generated. COMPARISON:  None Correlation: Cervical spine radiographs 08/20/2008 FINDINGS: CT HEAD FINDINGS Brain: Mild age-related atrophy. Normal ventricular morphology. No midline shift or mass effect. Otherwise normal appearance of brain parenchyma. No intracranial hemorrhage, mass lesion or evidence acute infarction. No extra-axial fluid collections. Vascular: Minimal atherosclerotic calcification of internal carotid arteries at skull base. Skull: Intact Sinuses/Orbits: Clear Other: N/A CT CERVICAL SPINE FINDINGS Alignment: Minimal anterolisthesis C4-C5 unchanged. Remaining cervical spine alignment normal. Skull base and vertebrae: Osseous mineralization normal. Visualized skull base intact. Multilevel facet degenerative changes. Vertebral body heights maintained. No fracture, additional subluxation, or bone destruction. Soft tissues and spinal canal: Prevertebral soft tissues normal thickness. Asymmetric enlargement of the RIGHT thyroid lobe versus LEFT question mass 3.5 cm diameter. Disc levels:  No additional abnormalities Upper chest: Visualized lung apices clear. Other: N/A IMPRESSION: No acute intracranial abnormalities. Degenerative facet disease changes of the cervical spine. No acute cervical spine abnormalities. Question RIGHT thyroid mass 3.5 cm diameter; ultrasound assessment recommended to exclude tumor. Electronically Signed   By: Lavonia Dana M.D.   On: 05/30/2018 18:00  Mr Brain Wo Contrast  Result Date: 05/30/2018 CLINICAL DATA:  Stroke follow-up.  Left upper extremity numbness. EXAM: MRI HEAD WITHOUT CONTRAST MRA HEAD WITHOUT CONTRAST TECHNIQUE: Multiplanar, multiecho pulse sequences of the brain and surrounding structures were obtained without intravenous contrast. Angiographic images of the head were  obtained using MRA technique without contrast. COMPARISON:  Head CT 05/30/2018 FINDINGS: MRI HEAD FINDINGS BRAIN: There is no acute infarct, acute hemorrhage or mass effect. The midline structures are normal. There are no old infarcts. Multifocal white matter hyperintensity, most commonly due to chronic ischemic microangiopathy. Generalized atrophy without lobar predilection. Susceptibility-sensitive sequences show no chronic microhemorrhage or superficial siderosis. SKULL AND UPPER CERVICAL SPINE: The visualized skull base, calvarium, upper cervical spine and extracranial soft tissues are normal. SINUSES/ORBITS: No fluid levels or advanced mucosal thickening. No mastoid or middle ear effusion. The orbits are normal. MRA HEAD FINDINGS POSTERIOR CIRCULATION: --Basilar artery: Normal. --Posterior cerebral arteries: Normal. The right PCA is predominantly supplied by the posterior communicating artery. --Superior cerebellar arteries: Normal. --Inferior cerebellar arteries: Normal posterior inferior cerebellar arteries. Anterior inferior cerebellar arteries are not clearly visible, but this is not uncommon. ANTERIOR CIRCULATION: --Intracranial internal carotid arteries: Normal. --Anterior cerebral arteries: Normal. Both A1 segments are present. Patent anterior communicating artery. --Middle cerebral arteries: Normal. --Posterior communicating arteries: Right p-comm gives rise to the right PCA. No left p-comm is visualized. IMPRESSION: 1. No acute intracranial abnormality. 2. Mild chronic small vessel disease. 3. No intracranial arterial occlusion or high-grade stenosis. Electronically Signed   By: Ulyses Jarred M.D.   On: 05/30/2018 21:51   US Carotid Bilateral (at [facility] Only)  Result Date: 05/31/2018 CLINICAL DATA:  CVA EXAM: BILATERAL CAROTID DUPLEX ULTRASOUND TECHNIQUE: Pearline Cables scale imaging, color Doppler and duplex ultrasound were performed of bilateral carotid and vertebral arteries in the neck. COMPARISON:   None. FINDINGS: Criteria: Quantification of carotid stenosis is based on velocity parameters that correlate the residual internal carotid diameter with NASCET-based stenosis levels, using the diameter of the distal internal carotid lumen as the denominator for stenosis measurement. The following velocity measurements were obtained: RIGHT ICA: 58 cm/sec CCA: 45 cm/sec SYSTOLIC ICA/CCA RATIO:  1.3 ECA: 56 cm/sec LEFT ICA: 51 cm/sec CCA: 59 cm/sec SYSTOLIC ICA/CCA RATIO:  0.9 ECA: 54 cm/sec RIGHT CAROTID ARTERY: Mild smooth calcified plaque in the bulb. Low resistance internal carotid Doppler pattern is preserved. RIGHT VERTEBRAL ARTERY:  Antegrade. LEFT CAROTID ARTERY: Mild irregular calcified plaque in the bulb and lower internal carotid artery. Low resistance internal carotid Doppler pattern is preserved. LEFT VERTEBRAL ARTERY:  Antegrade. IMPRESSION: Less than 50% stenosis in the right and left internal carotid arteries. Electronically Signed   By: Marybelle Killings M.D.   On: 05/31/2018 10:18   Mr Jodene Nam Head/brain VC Cm  Result Date: 05/30/2018 CLINICAL DATA:  Stroke follow-up.  Left upper extremity numbness. EXAM: MRI HEAD WITHOUT CONTRAST MRA HEAD WITHOUT CONTRAST TECHNIQUE: Multiplanar, multiecho pulse sequences of the brain and surrounding structures were obtained without intravenous contrast. Angiographic images of the head were obtained using MRA technique without contrast. COMPARISON:  Head CT 05/30/2018 FINDINGS: MRI HEAD FINDINGS BRAIN: There is no acute infarct, acute hemorrhage or mass effect. The midline structures are normal. There are no old infarcts. Multifocal white matter hyperintensity, most commonly due to chronic ischemic microangiopathy. Generalized atrophy without lobar predilection. Susceptibility-sensitive sequences show no chronic microhemorrhage or superficial siderosis. SKULL AND UPPER CERVICAL SPINE: The visualized skull base, calvarium, upper cervical spine and extracranial soft tissues  are  normal. SINUSES/ORBITS: No fluid levels or advanced mucosal thickening. No mastoid or middle ear effusion. The orbits are normal. MRA HEAD FINDINGS POSTERIOR CIRCULATION: --Basilar artery: Normal. --Posterior cerebral arteries: Normal. The right PCA is predominantly supplied by the posterior communicating artery. --Superior cerebellar arteries: Normal. --Inferior cerebellar arteries: Normal posterior inferior cerebellar arteries. Anterior inferior cerebellar arteries are not clearly visible, but this is not uncommon. ANTERIOR CIRCULATION: --Intracranial internal carotid arteries: Normal. --Anterior cerebral arteries: Normal. Both A1 segments are present. Patent anterior communicating artery. --Middle cerebral arteries: Normal. --Posterior communicating arteries: Right p-comm gives rise to the right PCA. No left p-comm is visualized. IMPRESSION: 1. No acute intracranial abnormality. 2. Mild chronic small vessel disease. 3. No intracranial arterial occlusion or high-grade stenosis. Electronically Signed   By: Ulyses Jarred M.D.   On: 05/30/2018 21:51      Management plans discussed with the patient, family and they are in agreement.  CODE STATUS:     Code Status Orders  (From admission, onward)         Start     Ordered   05/30/18 1905  Do not attempt resuscitation (DNR)  Continuous    Question Answer Comment  In the event of cardiac or respiratory ARREST Do not call a "code blue"   In the event of cardiac or respiratory ARREST Do not perform Intubation, CPR, defibrillation or ACLS   In the event of cardiac or respiratory ARREST Use medication by any route, position, wound care, and other measures to relive pain and suffering. May use oxygen, suction and manual treatment of airway obstruction as needed for comfort.      05/30/18 1904         TOTAL TIME TAKING CARE OF THIS PATIENT: 40 minutes.    Henreitta Leber M.D on 05/31/2018 at 4:07 PM  Between 7am to 6pm - Pager -  [phone]  After 6pm go to [email or web address] - Proofreader  [facility]  Office  [phone]  CC: Primary care physician; Lauren Crews, MD

## 2018-05-31 NOTE — Progress Notes (Signed)
SLP Cancellation Note  Patient Details Name: DAO MEARNS MRN: [id number] DOB: Sep 25, 1940   Cancelled treatment:       Reason Eval/Treat Not Completed: SLP screened, no needs identified, will sign off(chart reviewed, consulted NSG, met w/ pt). Pt denied any difficulty swallowing and is currently on a regular diet; tolerates swallowing pills w/ water per NSG. Upon entering pt's room, pt was in the restroom and told SLP "hold on I'll be right out." Pt conversed clearly and denied any speech-language deficits. Pt reported no difficulty with word output or conversational abilities.  No further skilled ST services indicated as pt appears at her baseline. Pt agreed. NSG consult if any change in status.   Emeline General, Graduate Student SLP 05/31/2018, 1:54 PM

## 2018-05-31 NOTE — Telephone Encounter (Signed)
Patient scheduled for a hospital follow up on 06/07/2018.

## 2018-05-31 NOTE — Procedures (Signed)
Transthoracic echocardiogram  [facility], [facility] Accession #[id number] MR #[id number] Date of birth 11/07/1940 Date of study January 16th 2020 Reason for study: Atrial fibrillation, TIA  Left ventricle: Cavity size was normal, mild concentric LVH, systolic function normal Estimate ejection fraction 55 to 60% Wall motion was normal, no regional wall motion abnormalities Unable to determine diastolic dysfunction parameters given arrhythmia  Mitral valve Mild to moderate MR Left atrium Mild to moderately dilated  Right ventricle Systolic function was normal  Tricuspid valve Moderate tricuspid valve regurgitation  Pulmonary arteries Main pulmonary artery was mildly dilated Systolic pressure was moderately elevated with PA pressure estimated 51 mmHg  Inferior vena cava Dilated, respirophasic diameter changes were blunted less than 50% consistent with elevated central venous pressure  Additional impressions Arrhythmia noted, unable to exclude atrial fibrillation  Please see scanned report for complete details  Signed, Esmond Plants, MD, Ph.D [facility]

## 2018-05-31 NOTE — Progress Notes (Signed)
*  PRELIMINARY RESULTS* Echocardiogram 2D Echocardiogram has been performed.  Lauren Lloyd Lauren Lloyd 05/31/2018, 9:19 AM

## 2018-05-31 NOTE — Care Management Obs Status (Signed)
[facility] NOTIFICATION   Patient Details  Name: SAYLER MICKIEWICZ MRN: [id number] Date of Birth: 03-22-1941   Medicare Observation Status Notification Given:  No  Patient has not met 24 hr hospital stay   Latanya Maudlin, RN 05/31/2018, 1:51 PM

## 2018-05-31 NOTE — Consult Note (Signed)
Referring Physician: Abel Presto, J md    Chief Complaint: Left paresthesia and pain  HPI: Lauren Lloyd is an 78 y.o. female past medical history of hypertension, GERD, degenerative disc disease, and atrial fibrillation on Eliquis presenting to the ED on 05/30/2018 chief complaints of left hand pain, numbness and tingling sensation radiating up her arm and shoulder.  Patient reports she went to bed the night prior to her symptoms and woke up with a funny sensation in her hand and sharp pain. Denies associated altered sensorium, speech abnormality, cranial nerve deficit, seizures, focal motor  deficits, diplopia, nausea or vomiting, syncope or LOC, dizziness or headache. Describes symptoms as  paresthesia (numbness, tingling, pins-and-needles sensation) and pain that is mostly localized in the palm of her hands.  Patient report that prior night she had episodes of heart palpitations so thought it may have had something to to with her symptoms.  Patient called her PCP who advised her to be evaluated in the ED for possible stroke.  On arrival to the ED symptoms were still persistent so work-up was initiated with CT head showing no acute intracranial abnormality.  Initial NIH stroke scale was 1.  Due to persistent symptoms and history of A. fib patient was admitted for further stroke work-up and evaluation.  Date last known well: Date: 05/29/2018 Time last known well: Unable to determine tPA Given: No: Outside window period, on Eliquis, NIHSS 1  Past Medical History:  Diagnosis Date  . A-fib ([place])   . Arthritis    knees, Hands  . GERD (gastroesophageal reflux disease)     Past Surgical History:  Procedure Laterality Date  . CATARACT EXTRACTION W/PHACO Right 01/25/2016   Procedure: CATARACT EXTRACTION PHACO AND INTRAOCULAR LENS PLACEMENT (IOC);  Surgeon: Ronnell Freshwater, MD;  Location: [facility];  Service: Ophthalmology;  Laterality: Right;  RIGHT  . CATARACT EXTRACTION W/PHACO  Left 02/22/2016   Procedure: CATARACT EXTRACTION PHACO AND INTRAOCULAR LENS PLACEMENT (IOC);  Surgeon: Ronnell Freshwater, MD;  Location: [place];  Service: Ophthalmology;  Laterality: Left;  LEFT  . [place]  2010  . KNEE ARTHROSCOPY Right 2004  . REPLACEMENT TOTAL KNEE Right 2009   [facility]  . SKIN GRAFT Left 04/08/2013   Done on left index finger  . TONSILLECTOMY  1946    Family History  Problem Relation Age of Onset  . Hyperlipidemia Sister   . Atrial fibrillation Sister   . Transient ischemic attack Mother   . Heart attack Father   . Healthy Brother   . Breast cancer Sister 16  . Hyperlipidemia Sister   . Atrial fibrillation Sister    Social History:  reports that she quit smoking about 29 years ago. Her smoking use included cigarettes. She has a 30.00 pack-year smoking history. She has never used smokeless tobacco. She reports current alcohol use of about 14.0 standard drinks of alcohol per week. She reports that she does not use drugs.  Allergies:  Allergies  Allergen Reactions  . Levofloxacin     Other reaction(s): Joint Pains Other reaction(s): Other (See Comments) Joint pain  . Influenza Vaccines Other (See Comments)    Bell's Palsy  . Oysters [Shellfish Allergy] Swelling    She states she had eaten them three days in a row and she developed swelling around her eyes.     Medications:  I have reviewed the patient's current medications. Prior to Admission:  Medications Prior to Admission  Medication Sig  Dispense Refill Last Dose  . apixaban (ELIQUIS) 5 MG TABS tablet Take 1 tablet (5 mg total) by mouth 2 (two) times daily. 180 tablet 3 05/30/2018 at 0900  . diltiazem (CARDIZEM CD) 300 MG 24 hr capsule Take 1 capsule (300 mg total) by mouth daily. 30 capsule 1 05/30/2018 at 0900  . furosemide (LASIX) 40 MG tablet Take 1 tablet (40 mg total) by mouth daily. 30 tablet 11 05/30/2018 at 0900  . metoprolol tartrate  (LOPRESSOR) 25 MG tablet Take 1 tablet (25 mg total) by mouth 2 (two) times daily. 60 tablet 1 05/30/2018 at 0900  . Multiple Vitamin (MULTIVITAMIN) capsule Take 1 capsule by mouth daily.   05/30/2018 at 0900  . potassium chloride (K-DUR) 10 MEQ tablet Take 2 tablets (20 meq) by mouth once daily 60 tablet 3 05/30/2018 at 0900  . VENTOLIN HFA 108 (90 Base) MCG/ACT inhaler TAKE 1 TO 2 PUFFS EVERY 6 HOURS AS NEEDED FOR SHORTNESS OF BREATH (Patient not taking: Reported on 05/30/2018) 18 g 3 Not Taking at Unknown time   Scheduled: . apixaban  5 mg Oral BID  . aspirin  300 mg Rectal Daily   Or  . aspirin  325 mg Oral Daily  . diltiazem  300 mg Oral Daily  . furosemide  40 mg Oral Daily  . metoprolol tartrate  25 mg Oral BID  . multivitamin with minerals  1 tablet Oral Daily  . potassium chloride  10 mEq Oral Daily  . sodium chloride flush  3 mL Intravenous Once    ROS: History obtained from the patient   General ROS: negative for - chills, fatigue, fever, night sweats, weight gain or weight loss Psychological ROS: negative for - behavioral disorder, hallucinations, memory difficulties, mood swings or suicidal ideation Ophthalmic ROS: negative for - blurry vision, double vision, eye pain or loss of vision ENT ROS: negative for - epistaxis, nasal discharge, oral lesions, sore throat, tinnitus or vertigo Allergy and Immunology ROS: negative for - hives or itchy/watery eyes Hematological and Lymphatic ROS: negative for - bleeding problems, bruising or swollen lymph nodes Endocrine ROS: negative for - galactorrhea, hair pattern changes, polydipsia/polyuria or temperature intolerance Respiratory ROS: negative for - cough, hemoptysis, shortness of breath or wheezing Cardiovascular ROS: negative for - chest pain, dyspnea on exertion, edema or irregular heartbeat Gastrointestinal ROS: negative for - abdominal pain, diarrhea, hematemesis, nausea/vomiting or stool incontinence Genito-Urinary ROS: negative  for - dysuria, hematuria, incontinence or urinary frequency/urgency Musculoskeletal ROS: negative for - joint swelling or muscular weakness Neurological ROS: as noted in HPI Dermatological ROS: negative for rash and skin lesion changes  Physical Examination: Blood pressure 111/87, pulse (!) 138, temperature 98.2 F (36.8 C), temperature source Oral, resp. rate 17, height 5\' 8"  (1.727 m), weight 96.1 kg, SpO2 94 %.   HEENT-  Normocephalic, no lesions, without obvious abnormality.  Normal external eye and conjunctiva.  Normal TM's bilaterally.  Normal auditory canals and external ears. Normal external nose, mucus membranes and septum.  Normal pharynx. Cardiovascular- S1, S2 normal, pulses palpable throughout   Lungs- chest clear, no wheezing, rales, normal symmetric air entry Abdomen- soft, non-tender; bowel sounds normal; no masses,  no organomegaly Extremities- no edema Lymph-no adenopathy palpable Musculoskeletal-no joint tenderness, deformity or swelling Skin-warm and dry, no hyperpigmentation, vitiligo, or suspicious lesions  Neurological Exam   Mental Status: Alert, oriented, thought content appropriate.  Speech fluent without evidence of aphasia.  Able to follow 3 step commands without difficulty. Attention span and concentration  seemed appropriate  Cranial Nerves: II: Discs flat bilaterally; Visual fields grossly normal, pupils equal, round, reactive to light and accommodation III,IV, VI: ptosis not present, extra-ocular motions intact bilaterally V,VII: smile symmetric, facial light touch sensation intact VIII: hearing normal bilaterally IX,X: gag reflex present XI: bilateral shoulder shrug XII: midline tongue extension Motor: Right :  Upper extremity   5/5 Without pronator drift      Left: Upper extremity   5/5 without pronator drift Right:   Lower extremity   5/5                                          Left: Lower extremity   5/5 Tone and bulk:normal tone throughout; no  atrophy noted Sensory: Pinprick and light touch intact bilaterally Deep Tendon Reflexes: 2+ and symmetric throughout Plantars: Right: downgoing                             Left: downgoing Cerebellar: Finger-to-nose testing intact bilaterally. Heel to shin testing normal bilaterally Gait: Observed ambulating in hallway without difficulty. Gait steady  Data Reviewed  Laboratory Studies:  Basic Metabolic Panel: Recent Labs  Lab 05/30/18 1604  NA 139  K 3.9  CL 104  CO2 24  GLUCOSE 114*  BUN 24*  CREATININE 0.76  CALCIUM 9.5    Liver Function Tests: No results for input(s): AST, ALT, ALKPHOS, BILITOT, PROT, ALBUMIN in the last 168 hours. No results for input(s): LIPASE, AMYLASE in the last 168 hours. No results for input(s): AMMONIA in the last 168 hours.  CBC: Recent Labs  Lab 05/30/18 1604  WBC 9.2  HGB 13.5  HCT 41.7  MCV 90.8  PLT 284    Cardiac Enzymes: Recent Labs  Lab 05/30/18 1604  TROPONINI <0.03    BNP: Invalid input(s): POCBNP  CBG: No results for input(s): GLUCAP in the last 168 hours.  Microbiology: Results for orders placed or performed in visit on 02/23/18  Urine Culture     Status: Abnormal   Collection Time: 02/23/18 11:59 AM  Result Value Ref Range Status   Urine Culture, Routine Final report (A)  Final   Organism ID, Bacteria Escherichia coli (A)  Final    Comment: 10,000-25,000 colony forming units per mL Cefazolin <=4 ug/mL Cefazolin with an MIC <=16 predicts susceptibility to the oral agents cefaclor, cefdinir, cefpodoxime, cefprozil, cefuroxime, cephalexin, and loracarbef when used for therapy of uncomplicated urinary tract infections due to E. coli, Klebsiella pneumoniae, and Proteus mirabilis.    Antimicrobial Susceptibility Comment  Final    Comment:       ** S = Susceptible; I = Intermediate; R = Resistant **                    P = Positive; N = Negative             MICS are expressed in micrograms per mL    Antibiotic                  RSLT#1    RSLT#2    RSLT#3    RSLT#4 Amoxicillin/Clavulanic Acid    S Ampicillin                     S Cefepime  S Ceftriaxone                    S Cefuroxime                     S Ciprofloxacin                  S Ertapenem                      S Gentamicin                     S Imipenem                       S Levofloxacin                   S Meropenem                      S Nitrofurantoin                 S Piperacillin/Tazobactam        S Tetracycline                   S Tobramycin                     S Trimethoprim/Sulfa             S     Coagulation Studies: Recent Labs    05/30/18 1604  LABPROT 13.2  INR 1.01    Urinalysis: No results for input(s): COLORURINE, LABSPEC, PHURINE, GLUCOSEU, HGBUR, BILIRUBINUR, KETONESUR, PROTEINUR, UROBILINOGEN, NITRITE, LEUKOCYTESUR in the last 168 hours.  Invalid input(s): APPERANCEUR  Lipid Panel:    Component Value Date/Time   CHOL 180 05/31/2018 0452   CHOL 269 (H) 12/28/2016 0823   TRIG 107 05/31/2018 0452   HDL 50 05/31/2018 0452   HDL 67 12/28/2016 0823   CHOLHDL 3.6 05/31/2018 0452   VLDL 21 05/31/2018 0452   LDLCALC 109 (H) 05/31/2018 0452   LDLCALC 169 (H) 12/28/2016 0823    HgbA1C: No results found for: HGBA1C  Urine Drug Screen:  No results found for: LABOPIA, COCAINSCRNUR, LABBENZ, AMPHETMU, THCU, LABBARB  Alcohol Level: No results for input(s): ETH in the last 168 hours.  Other results: EKG: atrial fibrillation, rate 100 Vent. rate 100 BPM PR interval * ms QRS duration 86 ms QT/QTc 356/459 ms P-R-T axes * 67 60  Imaging: Dg Chest 2 View  Result Date: 05/30/2018 CLINICAL DATA:  78 year old female with left hand numbness onset this morning. Atrial fibrillation. Shortness of breath. EXAM: CHEST - 2 VIEW COMPARISON:  05/03/2018 and earlier. FINDINGS: Small bilateral pleural effusions in December have resolved. Stable cardiomegaly and mediastinal contours. Bilateral increased  pulmonary interstitial opacity appears chronic. No pneumothorax or acute pulmonary opacity. No acute osseous abnormality identified. Abdominal Calcified aortic atherosclerosis. Negative visible bowel gas pattern. IMPRESSION: 1. Resolved small pleural effusion since December. No acute cardiopulmonary abnormality. 2. Aortic Atherosclerosis (ICD10-I70.0). Electronically Signed   By: Genevie Ann M.D.   On: 05/30/2018 16:39   Ct Head Wo Contrast  Result Date: 05/30/2018 CLINICAL DATA:  LEFT arm pain and numbness, heart racing last night, radiculopathy, focal neural deficit of greater than 6 hours question stroke EXAM: CT HEAD WITHOUT CONTRAST CT CERVICAL SPINE WITHOUT CONTRAST TECHNIQUE: Multidetector CT imaging of the head and cervical spine was performed  following the standard protocol without intravenous contrast. Multiplanar CT image reconstructions of the cervical spine were also generated. COMPARISON:  None Correlation: Cervical spine radiographs 08/20/2008 FINDINGS: CT HEAD FINDINGS Brain: Mild age-related atrophy. Normal ventricular morphology. No midline shift or mass effect. Otherwise normal appearance of brain parenchyma. No intracranial hemorrhage, mass lesion or evidence acute infarction. No extra-axial fluid collections. Vascular: Minimal atherosclerotic calcification of internal carotid arteries at skull base. Skull: Intact Sinuses/Orbits: Clear Other: N/A CT CERVICAL SPINE FINDINGS Alignment: Minimal anterolisthesis C4-C5 unchanged. Remaining cervical spine alignment normal. Skull base and vertebrae: Osseous mineralization normal. Visualized skull base intact. Multilevel facet degenerative changes. Vertebral body heights maintained. No fracture, additional subluxation, or bone destruction. Soft tissues and spinal canal: Prevertebral soft tissues normal thickness. Asymmetric enlargement of the RIGHT thyroid lobe versus LEFT question mass 3.5 cm diameter. Disc levels:  No additional abnormalities Upper  chest: Visualized lung apices clear. Other: N/A IMPRESSION: No acute intracranial abnormalities. Degenerative facet disease changes of the cervical spine. No acute cervical spine abnormalities. Question RIGHT thyroid mass 3.5 cm diameter; ultrasound assessment recommended to exclude tumor. Electronically Signed   By: Lavonia Dana M.D.   On: 05/30/2018 18:00   Ct Cervical Spine Wo Contrast  Result Date: 05/30/2018 CLINICAL DATA:  LEFT arm pain and numbness, heart racing last night, radiculopathy, focal neural deficit of greater than 6 hours question stroke EXAM: CT HEAD WITHOUT CONTRAST CT CERVICAL SPINE WITHOUT CONTRAST TECHNIQUE: Multidetector CT imaging of the head and cervical spine was performed following the standard protocol without intravenous contrast. Multiplanar CT image reconstructions of the cervical spine were also generated. COMPARISON:  None Correlation: Cervical spine radiographs 08/20/2008 FINDINGS: CT HEAD FINDINGS Brain: Mild age-related atrophy. Normal ventricular morphology. No midline shift or mass effect. Otherwise normal appearance of brain parenchyma. No intracranial hemorrhage, mass lesion or evidence acute infarction. No extra-axial fluid collections. Vascular: Minimal atherosclerotic calcification of internal carotid arteries at skull base. Skull: Intact Sinuses/Orbits: Clear Other: N/A CT CERVICAL SPINE FINDINGS Alignment: Minimal anterolisthesis C4-C5 unchanged. Remaining cervical spine alignment normal. Skull base and vertebrae: Osseous mineralization normal. Visualized skull base intact. Multilevel facet degenerative changes. Vertebral body heights maintained. No fracture, additional subluxation, or bone destruction. Soft tissues and spinal canal: Prevertebral soft tissues normal thickness. Asymmetric enlargement of the RIGHT thyroid lobe versus LEFT question mass 3.5 cm diameter. Disc levels:  No additional abnormalities Upper chest: Visualized lung apices clear. Other: N/A  IMPRESSION: No acute intracranial abnormalities. Degenerative facet disease changes of the cervical spine. No acute cervical spine abnormalities. Question RIGHT thyroid mass 3.5 cm diameter; ultrasound assessment recommended to exclude tumor. Electronically Signed   By: Lavonia Dana M.D.   On: 05/30/2018 18:00   Mr Brain Wo Contrast  Result Date: 05/30/2018 CLINICAL DATA:  Stroke follow-up.  Left upper extremity numbness. EXAM: MRI HEAD WITHOUT CONTRAST MRA HEAD WITHOUT CONTRAST TECHNIQUE: Multiplanar, multiecho pulse sequences of the brain and surrounding structures were obtained without intravenous contrast. Angiographic images of the head were obtained using MRA technique without contrast. COMPARISON:  Head CT 05/30/2018 FINDINGS: MRI HEAD FINDINGS BRAIN: There is no acute infarct, acute hemorrhage or mass effect. The midline structures are normal. There are no old infarcts. Multifocal white matter hyperintensity, most commonly due to chronic ischemic microangiopathy. Generalized atrophy without lobar predilection. Susceptibility-sensitive sequences show no chronic microhemorrhage or superficial siderosis. SKULL AND UPPER CERVICAL SPINE: The visualized skull base, calvarium, upper cervical spine and extracranial soft tissues are normal. SINUSES/ORBITS: No fluid levels or advanced mucosal  thickening. No mastoid or middle ear effusion. The orbits are normal. MRA HEAD FINDINGS POSTERIOR CIRCULATION: --Basilar artery: Normal. --Posterior cerebral arteries: Normal. The right PCA is predominantly supplied by the posterior communicating artery. --Superior cerebellar arteries: Normal. --Inferior cerebellar arteries: Normal posterior inferior cerebellar arteries. Anterior inferior cerebellar arteries are not clearly visible, but this is not uncommon. ANTERIOR CIRCULATION: --Intracranial internal carotid arteries: Normal. --Anterior cerebral arteries: Normal. Both A1 segments are present. Patent anterior communicating  artery. --Middle cerebral arteries: Normal. --Posterior communicating arteries: Right p-comm gives rise to the right PCA. No left p-comm is visualized. IMPRESSION: 1. No acute intracranial abnormality. 2. Mild chronic small vessel disease. 3. No intracranial arterial occlusion or high-grade stenosis. Electronically Signed   By: Ulyses Jarred M.D.   On: 05/30/2018 21:51   US Carotid Bilateral (at [facility] And Ap Only)  Result Date: 05/31/2018 CLINICAL DATA:  CVA EXAM: BILATERAL CAROTID DUPLEX ULTRASOUND TECHNIQUE: Pearline Cables scale imaging, color Doppler and duplex ultrasound were performed of bilateral carotid and vertebral arteries in the neck. COMPARISON:  None. FINDINGS: Criteria: Quantification of carotid stenosis is based on velocity parameters that correlate the residual internal carotid diameter with NASCET-based stenosis levels, using the diameter of the distal internal carotid lumen as the denominator for stenosis measurement. The following velocity measurements were obtained: RIGHT ICA: 58 cm/sec CCA: 45 cm/sec SYSTOLIC ICA/CCA RATIO:  1.3 ECA: 56 cm/sec LEFT ICA: 51 cm/sec CCA: 59 cm/sec SYSTOLIC ICA/CCA RATIO:  0.9 ECA: 54 cm/sec RIGHT CAROTID ARTERY: Mild smooth calcified plaque in the bulb. Low resistance internal carotid Doppler pattern is preserved. RIGHT VERTEBRAL ARTERY:  Antegrade. LEFT CAROTID ARTERY: Mild irregular calcified plaque in the bulb and lower internal carotid artery. Low resistance internal carotid Doppler pattern is preserved. LEFT VERTEBRAL ARTERY:  Antegrade. IMPRESSION: Less than 50% stenosis in the right and left internal carotid arteries. Electronically Signed   By: Marybelle Killings M.D.   On: 05/31/2018 10:18   Mr Jodene Nam Head/brain HK Cm  Result Date: 05/30/2018 CLINICAL DATA:  Stroke follow-up.  Left upper extremity numbness. EXAM: MRI HEAD WITHOUT CONTRAST MRA HEAD WITHOUT CONTRAST TECHNIQUE: Multiplanar, multiecho pulse sequences of the brain and surrounding structures were obtained  without intravenous contrast. Angiographic images of the head were obtained using MRA technique without contrast. COMPARISON:  Head CT 05/30/2018 FINDINGS: MRI HEAD FINDINGS BRAIN: There is no acute infarct, acute hemorrhage or mass effect. The midline structures are normal. There are no old infarcts. Multifocal white matter hyperintensity, most commonly due to chronic ischemic microangiopathy. Generalized atrophy without lobar predilection. Susceptibility-sensitive sequences show no chronic microhemorrhage or superficial siderosis. SKULL AND UPPER CERVICAL SPINE: The visualized skull base, calvarium, upper cervical spine and extracranial soft tissues are normal. SINUSES/ORBITS: No fluid levels or advanced mucosal thickening. No mastoid or middle ear effusion. The orbits are normal. MRA HEAD FINDINGS POSTERIOR CIRCULATION: --Basilar artery: Normal. --Posterior cerebral arteries: Normal. The right PCA is predominantly supplied by the posterior communicating artery. --Superior cerebellar arteries: Normal. --Inferior cerebellar arteries: Normal posterior inferior cerebellar arteries. Anterior inferior cerebellar arteries are not clearly visible, but this is not uncommon. ANTERIOR CIRCULATION: --Intracranial internal carotid arteries: Normal. --Anterior cerebral arteries: Normal. Both A1 segments are present. Patent anterior communicating artery. --Middle cerebral arteries: Normal. --Posterior communicating arteries: Right p-comm gives rise to the right PCA. No left p-comm is visualized. IMPRESSION: 1. No acute intracranial abnormality. 2. Mild chronic small vessel disease. 3. No intracranial arterial occlusion or high-grade stenosis. Electronically Signed   By: Cletus Gash.D.  On: 05/30/2018 21:51   Assessment: 78 y.o. female with past medical history of hypertension, GERD, degenerative disc disease, and atrial fibrillation on Eliquis presenting to the ED on 05/30/2018 chief complaints of left hand pain, numbness  and tingling sensation radiating up her arm and shoulder. Etiology likely radiculopathy from probable compressed, or pinched nerve.  MRI of the brain reviewed and shows no acute intracranial abnormality to explain symptoms.  MRA showed no intracranial arterial occlusion or high-grade stenosis.  CT cervical spine shows no acute cervical spine abnormality.  LDL 109.  Patient reports she was taking Eliquis as prescribed for A. fib prior to this event.  Stroke Risk Factors - atrial fibrillation, hyperlipidemia and hypertension  Plan: 1. Continue medical management with Eliquis for afib as prescribed with intensive management of vascular risk factor to keep systolic BP (SBP) <993 mm Hg (130 mm Hg if diabetic). 2.  Aggressive lipid management with goal  low density lipoprotein (LDL) <70 mg/dl modification 3.  Follow-up with outpatient neurology for consideration of EMG/NCS  This patient was staffed with Dr. Arnaldo Natal who personally evaluated patient, reviewed documentation and agreed with assessment and plan of care as above.  Rufina Falco, DNP, FNP-BC Board certified Nurse Practitioner Neurology Department    05/31/2018, 11:49 AM

## 2018-06-04 ENCOUNTER — Ambulatory Visit: Payer: Self-pay | Admitting: Physician Assistant

## 2018-06-04 NOTE — Progress Notes (Signed)
Cardiology Office Note Date:  06/06/2018  Patient ID:  Lauren, Lloyd [birth year], MRN [id number] PCP:  [name], MD  Cardiologist:  Dr. Rockey Situ, MD    Chief Complaint: Follow up  History of Present Illness: Lauren Lloyd is a 78 y.o. female with history of coronary calcifications on prior chest CT in 2016, recently diagnosed Afib in 04/2018 on Eliquis, chronic diastolic CHF, pulmonary hypertension, chronic lower extremity swelling, HTN, ashtma, and prior tobacco abuse quitting > 25 years prior who presents for follow up of her Afib.   She was admitted to the hospital in 04/2018 with worsening SOB, leg swelling, weight gain, and abdominal bloating. She was found to be in new onset Afib with RVR. She was rate controlled and diuresed with symptom improvement. Echo during that admission showed an EF of 55-60%, mild focal hypertrophy of the septum, no RWMA, mild MR, moderately dilated LA measuring 47 mm, mildly dilated RV with normal RVSF, severely dilated RA, PASP 34 mmHg. In hospital follow up on 05/11/18 she remained in Afib with well controlled ventricular response. She was most recently admitted to the hospital 1/15-1/16 for paresthesia. She underwent CT of the head and MRI of the brain which were not acute. Carotid artery ultrasound showed no hemodynamically significant stenosis. Repeat echo showed an EF of 55-60%, no rWMA, mild concentric LVH, mild to moderate MR, mild to moderately dilated LA, RVSF normal, moderate TR, PASP 51 mmHg. She was seen by neurology who did not feel the patient had a CVA or TIA. They felt the patient had a radiculopathy or underlying peripheral neuropathy with recommendation for outpatient neurology follow up.   She comes in today noting continued exertional shortness of breath and lower extremity swelling.  No associated palpitations chest pain, dizziness, presyncope, or syncope.  She continues to note paresthesias affecting the left arm and leg.  She has  follow-up with PCP later this week and has been referred to neurology.  She has been compliant with her Eliquis and denies missing any doses.  Blood pressure has been running in the 517G to 017C systolic.  No falls since she was last seen.  No BRBPR or melena.  No orthopnea, PND, or early satiety.  Past Medical History:  Diagnosis Date  . A-fib ([place])   . Arthritis    knees, Hands  . GERD (gastroesophageal reflux disease)     Past Surgical History:  Procedure Laterality Date  . CATARACT EXTRACTION W/PHACO Right 01/25/2016   Procedure: CATARACT EXTRACTION PHACO AND INTRAOCULAR LENS PLACEMENT (IOC);  Surgeon: Ronnell Freshwater, MD;  Location: [place];  Service: Ophthalmology;  Laterality: Right;  RIGHT  . CATARACT EXTRACTION W/PHACO Left 02/22/2016   Procedure: CATARACT EXTRACTION PHACO AND INTRAOCULAR LENS PLACEMENT (IOC);  Surgeon: Ronnell Freshwater, MD;  Location: [place];  Service: Ophthalmology;  Laterality: Left;  LEFT  . [place]  2010  . KNEE ARTHROSCOPY Right 2004  . REPLACEMENT TOTAL KNEE Right 2009   [facility]  . SKIN GRAFT Left 04/08/2013   Done on left index finger  . TONSILLECTOMY  1946    Current Meds  Medication Sig  . apixaban (ELIQUIS) 5 MG TABS tablet Take 1 tablet (5 mg total) by mouth 2 (two) times daily.  [name] Kitchen diltiazem (CARDIZEM CD) 300 MG 24 hr capsule Take 1 capsule (300 mg total) by mouth daily.  . furosemide (LASIX) 40 MG tablet Take 1 tablet (40 mg total) by mouth  daily.  . metoprolol tartrate (LOPRESSOR) 25 MG tablet Take 1 tablet (25 mg total) by mouth 2 (two) times daily.  . Multiple Vitamin (MULTIVITAMIN) capsule Take 1 capsule by mouth daily.  . potassium chloride (K-DUR) 10 MEQ tablet Take 2 tablets (20 meq) by mouth once daily    Allergies:   Levofloxacin; Influenza vaccines; and Oysters [shellfish allergy]   Social History:  The patient  reports that she quit smoking about 29 years  ago. Her smoking use included cigarettes. She has a 30.00 pack-year smoking history. She has never used smokeless tobacco. She reports current alcohol use of about 14.0 standard drinks of alcohol per week. She reports that she does not use drugs.   Family History:  [name] family history includes Atrial fibrillation in her sister and sister; Breast cancer (age of onset: 63) in her sister; Healthy in her brother; Heart attack in her father; Hyperlipidemia in her sister and sister; Transient ischemic attack in her mother.  ROS:   Review of Systems  Constitutional: Positive for malaise/fatigue. Negative for chills, diaphoresis, fever and weight loss.  HENT: Negative for congestion.   Eyes: Negative for discharge and redness.  Respiratory: Positive for shortness of breath. Negative for cough, hemoptysis, sputum production and wheezing.   Cardiovascular: Positive for leg swelling. Negative for chest pain, palpitations, orthopnea, claudication and PND.  Gastrointestinal: Negative for abdominal pain, blood in stool, heartburn, melena, nausea and vomiting.  Genitourinary: Negative for hematuria.  Musculoskeletal: Negative for falls and myalgias.  Skin: Negative for rash.  Neurological: Positive for sensory change and weakness. Negative for dizziness, tingling, tremors, speech change, focal weakness and loss of consciousness.  Endo/Heme/Allergies: Does not bruise/bleed easily.  Psychiatric/Behavioral: Negative for substance abuse. The patient is not nervous/anxious.   All other systems reviewed and are negative.    PHYSICAL EXAM:  VS:  BP 120/70 (BP Location: Left Arm, Patient Position: Sitting, Cuff Size: Normal)   Pulse 65   Ht 5' 7.5" (1.715 m)   Wt 216 lb 4 oz (98.1 kg)   SpO2 98%   BMI 33.37 kg/m  BMI: Body mass index is 33.37 kg/m.  Physical Exam  Constitutional: She is oriented to person, place, and time. She appears well-developed and well-nourished.  HENT:  Head: Normocephalic  and atraumatic.  Eyes: Right eye exhibits no discharge. Left eye exhibits no discharge.  Neck: Normal range of motion. No JVD present.  Cardiovascular: Normal rate, S1 normal and S2 normal. An irregularly irregular rhythm present. Exam reveals no distant heart sounds, no friction rub, no midsystolic click and no opening snap.  Murmur heard. High-pitched blowing holosystolic murmur is present with a grade of 2/6 at the apex. Pulses:      Posterior tibial pulses are 2+ on the right side and 2+ on the left side.  Pulmonary/Chest: Effort normal and breath sounds normal. No respiratory distress. She has no decreased breath sounds. She has no wheezes. She has no rales. She exhibits no tenderness.  Abdominal: Soft. She exhibits no distension. There is no abdominal tenderness.  Musculoskeletal:        General: Edema present.     Comments: 1+ bilateral nonpitting edema to the mid shins  Neurological: She is alert and oriented to person, place, and time.  Skin: Skin is warm and dry. No cyanosis. Nails show no clubbing.  Psychiatric: She has a normal mood and affect. Her speech is normal and behavior is normal. Judgment and thought content normal.     EKG:  Was ordered and interpreted by me today. Shows Afib, 65 bpm, no acute st/t changes   Recent Labs: 05/03/2018: B Natriuretic Peptide 376.0 05/04/2018: ALT 62; Magnesium 2.6; TSH 1.615 05/30/2018: BUN 24; Creatinine, Ser 0.76; Hemoglobin 13.5; Platelets 284; Potassium 3.9; Sodium 139  05/31/2018: Cholesterol 180; HDL 50; LDL Cholesterol 109; Total CHOL/HDL Ratio 3.6; Triglycerides 107; VLDL 21   Estimated Creatinine Clearance: 70.4 mL/min (by C-G formula based on SCr of 0.76 mg/dL).   Wt Readings from Last 3 Encounters:  06/06/18 216 lb 4 oz (98.1 kg)  05/30/18 211 lb 12.8 oz (96.1 kg)  05/24/18 211 lb 12.8 oz (96.1 kg)     Other studies reviewed: Additional studies/records reviewed today include: summarized above  ASSESSMENT AND  PLAN:  1. Persistent A. fib: She remains in A. fib with well-controlled ventricular response.  She has been compliant with Eliquis and has been adequately anticoagulated.  In the setting of her continued shortness of breath we will schedule the patient for cardioversion.  Decrease Cardizem to 180 mg daily with plans to discontinue this medicine altogether and follow-up.  Increase Lopressor to 50 mg twice daily.  Continue Eliquis 5 mg twice daily.  2. Chronic diastolic CHF/pulmonary hypertension: Pulmonary hypertension in the setting of prolonged tobacco abuse of 30 pack years.  Likely exacerbated by her underlying A. fib.  Weight is stable.  She does not appear grossly volume overloaded on exam today.  She has been taking Lasix 40 mg twice daily for the past several days with KCl repletion.  In this setting, we will check a BMP.  Following restoration of sinus rhythm should her shortness of breath persist would recommend ischemic evaluation.  Check CBC and BMP.  3. Lower extremity edema: Likely exacerbated by venous insufficiency and calcium channel blocker usage.  Decrease Cardizem as above with plans to discontinue this medicine moving forward.  4. COPD: Recommend she establish with pulmonology.  5. Paresthesias: Has been referred to neurology.  6. Hypertension: Continue medications as above.  Disposition: F/u with Dr. Fletcher Anon or an APP in 2 to 4 weeks.  Current medicines are reviewed at length with the patient today.  The patient did not have any concerns regarding medicines.  Signed, Christell Faith, PA-C 06/06/2018 11:52 AM     [facility] [address] [place] [address] [place], [place] [address] [phone]

## 2018-06-06 ENCOUNTER — Other Ambulatory Visit: Payer: Self-pay | Admitting: Physician Assistant

## 2018-06-06 ENCOUNTER — Encounter: Payer: Self-pay | Admitting: Physician Assistant

## 2018-06-06 ENCOUNTER — Ambulatory Visit: Payer: Medicare HMO | Admitting: Physician Assistant

## 2018-06-06 VITALS — BP 120/70 | HR 65 | Ht 67.5 in | Wt 216.2 lb

## 2018-06-06 DIAGNOSIS — I4819 Other persistent atrial fibrillation: Secondary | ICD-10-CM

## 2018-06-06 DIAGNOSIS — I10 Essential (primary) hypertension: Secondary | ICD-10-CM

## 2018-06-06 DIAGNOSIS — J432 Centrilobular emphysema: Secondary | ICD-10-CM

## 2018-06-06 DIAGNOSIS — I480 Paroxysmal atrial fibrillation: Secondary | ICD-10-CM | POA: Diagnosis not present

## 2018-06-06 DIAGNOSIS — I272 Pulmonary hypertension, unspecified: Secondary | ICD-10-CM

## 2018-06-06 DIAGNOSIS — R202 Paresthesia of skin: Secondary | ICD-10-CM | POA: Diagnosis not present

## 2018-06-06 DIAGNOSIS — I5032 Chronic diastolic (congestive) heart failure: Secondary | ICD-10-CM

## 2018-06-06 DIAGNOSIS — R600 Localized edema: Secondary | ICD-10-CM | POA: Diagnosis not present

## 2018-06-06 MED ORDER — METOPROLOL TARTRATE 50 MG PO TABS: 50.0000 mg | ORAL_TABLET | Freq: Two times a day (BID) | ORAL | 3 refills | Status: DC

## 2018-06-06 MED ORDER — DILTIAZEM HCL ER COATED BEADS 180 MG PO CP24: 180.0000 mg | ORAL_CAPSULE | Freq: Every day | ORAL | 3 refills | Status: DC

## 2018-06-06 NOTE — Patient Instructions (Addendum)
Medication Instructions:  Your physician has recommended you make the following change in your medication:  1. DECREASE Diltiazem to 180 mg once daily 2. INCREASE Metoprolol tartrate to 50 mg twice a day  Check on price for Xarelto 20 mg once daily to see if it is cheaper than Eliquis  Medication Samples have been provided to the patient.  Drug name: Eliquis       Strength: 5mg         Qty: 4 boxes  LOT: [id number]  Exp.Date: 6/22   If you need a refill on your cardiac medications before your next appointment, please call your pharmacy.   Lab work: BMET & CBC today If you have labs (blood work) drawn today and your tests are completely normal, you will receive your results only by: [name] Kitchen MyChart Message (if you have MyChart) OR . A paper copy in the mail If you have any lab test that is abnormal or we need to change your treatment, we will call you to review the results.  Testing/Procedures: You are scheduled for a Cardioversion on _January 31st_ with Dr._Gollan_ Please arrive at the [facility] at _06:30__ a.m. on the day of your procedure.  DIET INSTRUCTIONS:  Nothing to eat or drink after midnight except your medications with a small sip of water.         1) Labs: _Done 1/22/2020_  2) Medications:  Hold your Furosemide (Lasix) & Potassium until after your procedure that day. YOU MAY TAKE ALL of your remaining medications with a small amount of water.  3) Must have a responsible person to drive you home.  4) Bring a current list of your medications and current insurance cards.    If you have any questions after you get home, please call the office at [phone]   Follow-Up: At [facility], you and your health needs are our priority.  As part of our continuing mission to provide you with exceptional heart care, we have created designated Provider Care Teams.  These Care Teams include your primary Cardiologist (physician) and Advanced Practice Providers (APPs -   Physician Assistants and Nurse Practitioners) who all work together to provide you with the care you need, when you need it. You will need a follow up appointment in 4 weeks.  Please call our office 2 months in advance to schedule this appointment.  You may see Dr. Ida Rogue or one of the following Advanced Practice Providers on your designated Care Team:   Murray Hodgkins, NP Christell Faith, PA-C . Marrianne Mood, PA-C  Any Other Special Instructions Will Be Listed Below (If Applicable).      Electrical Cardioversion  Electrical cardioversion is the delivery of a jolt of electricity to restore a normal rhythm to the heart. A rhythm that is too fast or is not regular keeps the heart from pumping well. In this procedure, sticky patches or metal paddles are placed on the chest to deliver electricity to the heart from a device. This procedure may be done in an emergency if:  There is low or no blood pressure as a result of the heart rhythm.  Normal rhythm must be restored as fast as possible to protect the brain and heart from further damage.  It may save a life. This procedure may also be done for irregular or fast heart rhythms that are not immediately life-threatening. Tell a health care provider about:  Any allergies you have.  All medicines you are taking, including vitamins, herbs, eye  drops, creams, and over-the-counter medicines.  Any problems you or family members have had with anesthetic medicines.  Any blood disorders you have.  Any surgeries you have had.  Any medical conditions you have.  Whether you are pregnant or may be pregnant. What are the risks? Generally, this is a safe procedure. However, problems may occur, including:  Allergic reactions to medicines.  A blood clot that breaks free and travels to other parts of your body.  The possible return of an abnormal heart rhythm within hours or days after the procedure.  Your heart stopping (cardiac  arrest). This is rare. What happens before the procedure? Medicines  Your health care provider may have you start taking: ? Blood-thinning medicines (anticoagulants) so your blood does not clot as easily. ? Medicines may be given to help stabilize your heart rate and rhythm.  Ask your health care provider about changing or stopping your regular medicines. This is especially important if you are taking diabetes medicines or blood thinners. General instructions  Plan to have someone take you home from the hospital or clinic.  If you will be going home right after the procedure, plan to have someone with you for 24 hours.  Follow instructions from your health care provider about eating or drinking restrictions. What happens during the procedure?  To lower your risk of infection: ? Your health care team will wash or sanitize their hands. ? Your skin will be washed with soap.  An IV tube will be inserted into one of your veins.  You will be given a medicine to help you relax (sedative).  Sticky patches (electrodes) or metal paddles may be placed on your chest.  An electrical shock will be delivered. The procedure may vary among health care providers and hospitals. What happens after the procedure?   Your blood pressure, heart rate, breathing rate, and blood oxygen level will be monitored until the medicines you were given have worn off.  Do not drive for 24 hours if you were given a sedative.  Your heart rhythm will be watched to make sure it does not change. This information is not intended to replace advice given to you by your health care provider. Make sure you discuss any questions you have with your health care provider. Document Released: 04/22/2002 Document Revised: 12/30/2015 Document Reviewed: 11/06/2015 Elsevier Interactive Patient Education  2019 [employer].

## 2018-06-07 ENCOUNTER — Encounter: Payer: Self-pay | Admitting: Family Medicine

## 2018-06-07 ENCOUNTER — Ambulatory Visit (INDEPENDENT_AMBULATORY_CARE_PROVIDER_SITE_OTHER): Payer: Medicare HMO | Admitting: Family Medicine

## 2018-06-07 ENCOUNTER — Telehealth: Payer: Self-pay | Admitting: *Deleted

## 2018-06-07 VITALS — BP 113/72 | HR 70 | Temp 97.7°F | Wt 217.2 lb

## 2018-06-07 DIAGNOSIS — I4819 Other persistent atrial fibrillation: Secondary | ICD-10-CM

## 2018-06-07 DIAGNOSIS — R202 Paresthesia of skin: Secondary | ICD-10-CM

## 2018-06-07 DIAGNOSIS — J432 Centrilobular emphysema: Secondary | ICD-10-CM

## 2018-06-07 LAB — BASIC METABOLIC PANEL
BUN/Creatinine Ratio: 18 (ref 12–28)
BUN: 19 mg/dL (ref 8–27)
CO2: 24 mmol/L (ref 20–29)
Calcium: 9.8 mg/dL (ref 8.7–10.3)
Chloride: 101 mmol/L (ref 96–106)
Creatinine, Ser: 1.03 mg/dL — ABNORMAL HIGH (ref 0.57–1.00)
GFR calc non Af Amer: 52 mL/min/{1.73_m2} — ABNORMAL LOW (ref >59)
GFR, EST AFRICAN AMERICAN: 60 mL/min/{1.73_m2} (ref >59)
Glucose: 91 mg/dL (ref 65–99)
Potassium: 4.6 mmol/L (ref 3.5–5.2)
Sodium: 144 mmol/L (ref 134–144)

## 2018-06-07 LAB — CBC WITH DIFFERENTIAL/PLATELET
Basophils Absolute: 0.1 10*3/uL (ref 0.0–0.2)
Basos: 1 %
EOS (ABSOLUTE): 0.2 10*3/uL (ref 0.0–0.4)
EOS: 2 %
HEMATOCRIT: 41.1 % (ref 34.0–46.6)
Hemoglobin: 14 g/dL (ref 11.1–15.9)
Immature Grans (Abs): 0 10*3/uL (ref 0.0–0.1)
Immature Granulocytes: 0 %
Lymphocytes Absolute: 2.5 10*3/uL (ref 0.7–3.1)
Lymphs: 25 %
MCH: 30.2 pg (ref 26.6–33.0)
MCHC: 34.1 g/dL (ref 31.5–35.7)
MCV: 89 fL (ref 79–97)
Monocytes Absolute: 0.9 10*3/uL (ref 0.1–0.9)
Monocytes: 9 %
Neutrophils Absolute: 6.2 10*3/uL (ref 1.4–7.0)
Neutrophils: 63 %
Platelets: 279 10*3/uL (ref 150–450)
RBC: 4.63 x10E6/uL (ref 3.77–5.28)
RDW: 12.3 % (ref 11.7–15.4)
WBC: 9.8 10*3/uL (ref 3.4–10.8)

## 2018-06-07 NOTE — Telephone Encounter (Signed)
No answer. Left message to call back.   

## 2018-06-07 NOTE — Telephone Encounter (Signed)
-----   Message from Rise Mu, PA-C sent at 06/07/2018  7:14 AM EST ----- Blood count normal.  Renal function is slightly above her baseline.  Potassium at goal.  Recommend she hold Lasix and KCl for 2 days then resume Lasix at 40 mg daily and KCl at 20 mEq daily.  Follow up BMP at her post DCCV appointment.

## 2018-06-07 NOTE — Assessment & Plan Note (Signed)
New problem Though it is in patient's discharge summary, it is not believed that she had a TIA, but this is likely due to radiculopathy We will refer to neurology for outpatient follow-up as recommended by neurology inpatient It is possible she would benefit from EMG testing in the future

## 2018-06-07 NOTE — Progress Notes (Signed)
Patient: Lauren Lloyd Female    DOB: 1940/11/24   78 y.o.   MRN: [id number] Visit Date: 06/07/2018  Today's Provider: Lavon Paganini, MD   Chief Complaint  Patient presents with  . Hospitalization Follow-up   Subjective:    I, Tiburcio Pea, CMA, am acting as a scribe for Lavon Paganini, MD.   HPI  Follow up Hospitalization  Patient was admitted to [facility] on 05/30/2018 and discharged on 05/31/2018. She was treated for Paresthesias.  She underwent a TIA/CVA work-up including CT head, MRI brain which were negative for acute pathology.  She also had a carotid duplex which showed no hemodynamically significant carotid artery stenosis.  She was evaluated by neurology for concern for stroke given new onset left hand numbness.  They believe that this was not related to an intracranial process but rather radiculopathy or even underlying peripheral neuropathy.  They recommended she have outpatient follow-up with neurology and possible EMG testing.  She reports that paresthesias are stable Treatment for this included no changes. Telephone follow up was done on N/A She reports good compliance with treatment. She reports this condition is Unchanged.  She is concerned that she has ongoing shortness of breath and cough.  She is being followed by cardiology for her persistent A. fib.  She is scheduled for cardioversion next week.  They are also trying to decrease her diltiazem and increase her metoprolol with goal of getting her off calcium channel blockers due to her lower extremity edema  Patient was diagnosed with emphysema in the past and is a former smoker with 30-pack-year history.  She has an albuterol inhaler but does not use it.  She had a CT chest in 11/2016 that showed no cardiopulmonary disease ------------------------------------------------------------------------------------   Allergies  Allergen Reactions  . Levofloxacin     Other reaction(s): Joint Pains Other  reaction(s): Other (See Comments) Joint pain  . Influenza Vaccines Other (See Comments)    Bell's Palsy  . Oysters [Shellfish Allergy] Swelling    She states she had eaten them three days in a row and she developed swelling around her eyes.      Current Outpatient Medications:  .  apixaban (ELIQUIS) 5 MG TABS tablet, Take 1 tablet (5 mg total) by mouth 2 (two) times daily., Disp: 180 tablet, Rfl: 3 .  diltiazem (CARDIZEM CD) 180 MG 24 hr capsule, Take 1 capsule (180 mg total) by mouth daily., Disp: 90 capsule, Rfl: 3 .  furosemide (LASIX) 40 MG tablet, Take 1 tablet (40 mg total) by mouth daily., Disp: 30 tablet, Rfl: 11 .  metoprolol tartrate (LOPRESSOR) 50 MG tablet, Take 1 tablet (50 mg total) by mouth 2 (two) times daily., Disp: 180 tablet, Rfl: 3 .  Multiple Vitamin (MULTIVITAMIN) capsule, Take 1 capsule by mouth daily., Disp: , Rfl:  .  potassium chloride (K-DUR) 10 MEQ tablet, Take 2 tablets (20 meq) by mouth once daily, Disp: 60 tablet, Rfl: 3 .  VENTOLIN HFA 108 (90 Base) MCG/ACT inhaler, TAKE 1 TO 2 PUFFS EVERY 6 HOURS AS NEEDED FOR SHORTNESS OF BREATH (Patient not taking: Reported on 06/06/2018), Disp: 18 g, Rfl: 3  Review of Systems  Constitutional: Negative.   Respiratory: Negative.   Cardiovascular: Negative.   Musculoskeletal: Negative.        Hand pain     Social History   Tobacco Use  . Smoking status: Former Smoker    Packs/day: 1.00    Years: 30.00  Pack years: 30.00    Types: Cigarettes    Last attempt to quit: 05/16/1989    Years since quitting: 29.0  . Smokeless tobacco: Never Used  Substance Use Topics  . Alcohol use: Yes    Alcohol/week: 14.0 standard drinks    Types: 7 Glasses of wine, 7 Standard drinks or equivalent per week      Objective:   BP 113/72 (BP Location: Right Arm, Patient Position: Sitting, Cuff Size: Large)   Pulse 70   Temp 97.7 F (36.5 C) (Oral)   Wt 217 lb 3.2 oz (98.5 kg)   SpO2 99%   BMI 33.52 kg/m  Vitals:   06/07/18  0828  BP: 113/72  Pulse: 70  Temp: 97.7 F (36.5 C)  TempSrc: Oral  SpO2: 99%  Weight: 217 lb 3.2 oz (98.5 kg)     Physical Exam Vitals signs reviewed.  Constitutional:      General: She is not in acute distress.    Appearance: Normal appearance. She is not diaphoretic.  HENT:     Head: Normocephalic and atraumatic.     Right Ear: External ear normal.     Left Ear: External ear normal.     Nose: Nose normal.     Mouth/Throat:     Pharynx: Oropharynx is clear.  Eyes:     General: No scleral icterus.    Conjunctiva/sclera: Conjunctivae normal.  Neck:     Musculoskeletal: Neck supple.  Cardiovascular:     Rate and Rhythm: Normal rate. Rhythm irregularly irregular.     Pulses: Normal pulses.     Heart sounds: Normal heart sounds.  Pulmonary:     Effort: Pulmonary effort is normal. No respiratory distress.     Breath sounds: Normal breath sounds. No wheezing or rhonchi.  Abdominal:     General: There is no distension.     Palpations: Abdomen is soft.     Tenderness: There is no abdominal tenderness.  Musculoskeletal:     Right lower leg: Edema present.     Left lower leg: Edema present.  Lymphadenopathy:     Cervical: No cervical adenopathy.  Skin:    General: Skin is warm and dry.     Capillary Refill: Capillary refill takes less than 2 seconds.     Findings: No rash.  Neurological:     Mental Status: She is alert and oriented to person, place, and time. Mental status is at baseline.  Psychiatric:        Mood and Affect: Mood normal.        Behavior: Behavior normal.         Assessment & Plan   Problem List Items Addressed This Visit      Cardiovascular and Mediastinum   Persistent atrial fibrillation    Followed by cardiology Rate controlled currently They are working to decrease her diltiazem and increase her metoprolol She is scheduled for cardioversion next week Continue Eliquis        Respiratory   Centrilobular emphysema ([place])    Patient  continues to have shortness of breath and cough Her shortness of breath may likely be contributed to by her A. fib, but given that she still has a cough, we could consider that this could be related to her emphysema She is not wheezing and is moving good air on exam today, but we will give trial of Spiriva and see if this helps She was given a sample of this and shown how to use it in  clinic today She will let me know how this is working and we will consider ongoing prescription        Other   Paresthesias - Primary    New problem Though it is in patient's discharge summary, it is not believed that she had a TIA, but this is likely due to radiculopathy We will refer to neurology for outpatient follow-up as recommended by neurology inpatient It is possible she would benefit from EMG testing in the future      Relevant Orders   Ambulatory referral to Neurology       Return in about 4 weeks (around 07/05/2018) for As scheduled.   [name], [name]. Portions of this information were initially documented by Tiburcio Pea, CMA and reviewed by me for thoroughness and accuracy.    [name], MD, MPH [facility] 06/07/2018 10:50 AM

## 2018-06-07 NOTE — Assessment & Plan Note (Signed)
Followed by cardiology Rate controlled currently They are working to decrease her diltiazem and increase her metoprolol She is scheduled for cardioversion next week Continue Eliquis

## 2018-06-07 NOTE — Assessment & Plan Note (Signed)
Patient continues to have shortness of breath and cough Her shortness of breath may likely be contributed to by her A. fib, but given that she still has a cough, we could consider that this could be related to her emphysema She is not wheezing and is moving good air on exam today, but we will give trial of Spiriva and see if this helps She was given a sample of this and shown how to use it in clinic today She will let me know how this is working and we will consider ongoing prescription

## 2018-06-08 NOTE — Telephone Encounter (Signed)
Thank you, Heather!! 

## 2018-06-08 NOTE — Telephone Encounter (Signed)
I spoke with the patient. She is aware of her lab results and [name], PA's recommendations to:  1) hold lasix & potassium x 2 days, then 2) resume lasix at 40 mg once daily & potassium 20 meq once daily.  [name] medication list has that she is currently taking lasix 40 mg once daily & potassium 10 meq- 2 tablets (20 meq) once daily.  Per the patient she was told by Dr. Rockey Situ that she could take an extra lasix daily as needed for swelling. She confirms that the current bottles of lasix & potassium that she has states:  1) Lasix 40 mg- take 1 tablet by mouth twice daily 2) Potassium 10 meq- take 1 tablet twice daily  The patient states that she has been taking lasix 40 mg BID w/ and extra 40 mg daily PRN for swelling.  She has also been taking potassium 10 meq BID.  I advised the patient that [name] was basing his recommendations off of what her current medication list showed. I have asked that she decrease lasix 40 mg down from BID to QD x 2 days, and decrease potassium 10 meq down from BID to QD x 2 days, then resume her normal dosing.  The patient confirms she is having swelling in her feet and ankles and knows she will swell on the lower dose, but she is agreeable to decreasing lasix & potassium x 2 days. She is aware that we will repeat her BMP at her follow up appointment 2/18 with [place], [place]. She is scheduled for a DCCV on 06/15/18.   To [place], [place] as an [place]. I have also updated the patient's medication list as well.

## 2018-06-11 ENCOUNTER — Encounter: Payer: Self-pay | Admitting: Family Medicine

## 2018-06-14 ENCOUNTER — Other Ambulatory Visit: Payer: Self-pay | Admitting: Cardiovascular Disease

## 2018-06-15 ENCOUNTER — Ambulatory Visit: Payer: Medicare HMO | Admitting: Anesthesiology

## 2018-06-15 ENCOUNTER — Other Ambulatory Visit: Payer: Self-pay

## 2018-06-15 ENCOUNTER — Encounter
Admission: RE | Disposition: A | Discharge status: Home / Self Care | Payer: Self-pay | Source: Home / Self Care | Attending: Cardiovascular Disease

## 2018-06-15 ENCOUNTER — Ambulatory Visit
Admission: RE | Admit: 2018-06-15 | Discharge: 2018-06-15 | Disposition: A | Discharge status: Home / Self Care | Payer: Medicare HMO | Attending: Cardiovascular Disease | Admitting: Cardiovascular Disease

## 2018-06-15 DIAGNOSIS — I110 Hypertensive heart disease with heart failure: Secondary | ICD-10-CM | POA: Insufficient documentation

## 2018-06-15 DIAGNOSIS — I5032 Chronic diastolic (congestive) heart failure: Secondary | ICD-10-CM | POA: Diagnosis not present

## 2018-06-15 DIAGNOSIS — J449 Chronic obstructive pulmonary disease, unspecified: Secondary | ICD-10-CM | POA: Insufficient documentation

## 2018-06-15 DIAGNOSIS — Z91013 Allergy to seafood: Secondary | ICD-10-CM | POA: Insufficient documentation

## 2018-06-15 DIAGNOSIS — Z887 Allergy status to serum and vaccine status: Secondary | ICD-10-CM | POA: Diagnosis not present

## 2018-06-15 DIAGNOSIS — Z87891 Personal history of nicotine dependence: Secondary | ICD-10-CM | POA: Insufficient documentation

## 2018-06-15 DIAGNOSIS — I4819 Other persistent atrial fibrillation: Secondary | ICD-10-CM | POA: Diagnosis not present

## 2018-06-15 DIAGNOSIS — E7800 Pure hypercholesterolemia, unspecified: Secondary | ICD-10-CM | POA: Diagnosis not present

## 2018-06-15 DIAGNOSIS — Z79899 Other long term (current) drug therapy: Secondary | ICD-10-CM | POA: Diagnosis not present

## 2018-06-15 DIAGNOSIS — Z881 Allergy status to other antibiotic agents status: Secondary | ICD-10-CM | POA: Diagnosis not present

## 2018-06-15 DIAGNOSIS — I4891 Unspecified atrial fibrillation: Secondary | ICD-10-CM | POA: Diagnosis not present

## 2018-06-15 DIAGNOSIS — Z7901 Long term (current) use of anticoagulants: Secondary | ICD-10-CM | POA: Diagnosis not present

## 2018-06-15 DIAGNOSIS — K219 Gastro-esophageal reflux disease without esophagitis: Secondary | ICD-10-CM | POA: Diagnosis not present

## 2018-06-15 DIAGNOSIS — R0602 Shortness of breath: Secondary | ICD-10-CM | POA: Diagnosis not present

## 2018-06-15 DIAGNOSIS — Z8249 Family history of ischemic heart disease and other diseases of the circulatory system: Secondary | ICD-10-CM | POA: Diagnosis not present

## 2018-06-15 HISTORY — PX: CARDIOVERSION: EP1203

## 2018-06-15 SURGERY — CARDIOVERSION (CATH LAB)
Anesthesia: General

## 2018-06-15 MED ORDER — SODIUM CHLORIDE 0.9 % IV SOLN
INTRAVENOUS | Status: DC | PRN
  Administered 2018-06-15: 08:00:00 via INTRAVENOUS

## 2018-06-15 MED ORDER — PROPOFOL 10 MG/ML IV BOLUS
INTRAVENOUS | Status: AC
  Filled 2018-06-15: qty 40

## 2018-06-15 MED ORDER — PROPOFOL 10 MG/ML IV BOLUS
INTRAVENOUS | Status: DC | PRN
  Administered 2018-06-15: 50 mg via INTRAVENOUS

## 2018-06-15 MED ORDER — SODIUM CHLORIDE 0.9 % IV SOLN
Freq: Once | INTRAVENOUS | Status: AC
  Administered 2018-06-15: 1000 mL via INTRAVENOUS

## 2018-06-15 NOTE — Transfer of Care (Signed)
Immediate Anesthesia Transfer of Care Note  Patient: Lauren Lloyd  Procedure(s) Performed: CARDIOVERSION (CATH LAB) (N/A )  Patient Location: Short Stay  Anesthesia Type:General  Level of Consciousness: drowsy  Airway & Oxygen Therapy: Patient connected to nasal cannula oxygen  Post-op Assessment: Post -op Vital signs reviewed and stable  Post vital signs: stable  Last Vitals:  Vitals Value Taken Time  BP 121/58 06/15/2018  7:48 AM  Temp    Pulse 53 06/15/2018  7:48 AM  Resp 18 06/15/2018  7:48 AM  SpO2 98 % 06/15/2018  7:48 AM  Vitals shown include unvalidated device data.  Last Pain:  Vitals:   06/15/18 0653  TempSrc: Oral  PainSc: 0-No pain         Complications: No apparent anesthesia complications

## 2018-06-15 NOTE — Anesthesia Preprocedure Evaluation (Signed)
Anesthesia Evaluation  Patient identified by MRN, date of birth, ID band Patient awake    Reviewed: Allergy & Precautions, H&P , NPO status , Patient's Chart, lab work & pertinent test results, reviewed documented beta blocker date and time   Airway Mallampati: II  TM Distance: >3 FB Neck ROM: full    Dental  (+) Dental Advidsory Given   Pulmonary shortness of breath (from the afib), COPD, neg recent URI, former smoker,           Cardiovascular Exercise Tolerance: Good hypertension, +CHF  (-) CAD, (-) Past MI, (-) Cardiac Stents and (-) CABG + dysrhythmias Atrial Fibrillation (-) Valvular Problems/Murmurs     Neuro/Psych negative neurological ROS  negative psych ROS   GI/Hepatic Neg liver ROS, GERD  ,  Endo/Other  negative endocrine ROS  Renal/GU negative Renal ROS  negative genitourinary   Musculoskeletal   Abdominal   Peds  Hematology negative hematology ROS (+)   Anesthesia Other Findings Past Medical History: No date: A-fib ([place]) No date: Arthritis     Comment:  knees, Hands No date: GERD (gastroesophageal reflux disease)   Reproductive/Obstetrics negative OB ROS                             Anesthesia Physical Anesthesia Plan  ASA: III  Anesthesia Plan: General   Post-op Pain Management:    Induction: Intravenous  PONV Risk Score and Plan: 3 and Propofol infusion and TIVA  Airway Management Planned: Natural Airway and Nasal Cannula  Additional Equipment:   Intra-op Plan:   Post-operative Plan:   Informed Consent: I have reviewed the patients History and Physical, chart, labs and discussed the procedure including the risks, benefits and alternatives for the proposed anesthesia with the patient or authorized representative who has indicated his/her understanding and acceptance.     Dental Advisory Given  Plan Discussed with: Anesthesiologist, CRNA and  Surgeon  Anesthesia Plan Comments:         Anesthesia Quick Evaluation

## 2018-06-15 NOTE — CV Procedure (Signed)
Cardioversion procedure note For atrial fibrillation, persistent.  Procedure Details:  Consent: Risks of procedure as well as the alternatives and risks of each were explained to the (patient/caregiver). Consent for procedure obtained.  Time Out: Verified patient identification, verified procedure, site/side was marked, verified correct patient position, special equipment/implants available, medications/allergies/relevent history reviewed, required imaging and test results available. Performed  Patient placed on cardiac monitor, pulse oximetry, supplemental oxygen as necessary.  Sedation given: propofol IV, Dr. Rosey Bath Pacer pads placed anterior and posterior chest.   Cardioverted 2 time(s).  Cardioverted at  150J, followed by 200 J. Synchronized biphasic Converted to NSR   Evaluation: Findings: Post procedure EKG shows: NSR Complications: None Patient did tolerate procedure well.  Time Spent Directly with the Patient:  54 minutes   Esmond Plants, M.D., Ph.D.

## 2018-06-15 NOTE — Anesthesia Post-op Follow-up Note (Signed)
Anesthesia QCDR form completed.        

## 2018-06-15 NOTE — Anesthesia Postprocedure Evaluation (Signed)
Anesthesia Post Note  Patient: Lauren Lloyd  Procedure(s) Performed: CARDIOVERSION (CATH LAB) (N/A )  Patient location during evaluation: Cath Lab Anesthesia Type: General Level of consciousness: awake and alert Pain management: pain level controlled Vital Signs Assessment: post-procedure vital signs reviewed and stable Respiratory status: spontaneous breathing, nonlabored ventilation, respiratory function stable and patient connected to nasal cannula oxygen Cardiovascular status: blood pressure returned to baseline and stable Postop Assessment: no apparent nausea or vomiting Anesthetic complications: no     Last Vitals:  Vitals:   06/15/18 0815 06/15/18 0825  BP: 123/62 128/66  Pulse: (!) 55 (!) 57  Resp: 15 18  Temp:    SpO2: 96% 98%    Last Pain:  Vitals:   06/15/18 0825  TempSrc:   PainSc: 0-No pain                 [name]

## 2018-06-16 NOTE — H&P (Signed)
H&P Addendum, pre-cardioversion  Patient was seen and evaluated prior to -cardioversion procedure Symptoms, prior testing details again confirmed with the patient Patient examined, no significant change from prior exam Lab work reviewed in detail personally by myself Patient understands risk and benefit of the procedure, willing to proceed  Signed, Tim Keiyana Stehr, MD, Ph.D [facility]  

## 2018-06-21 ENCOUNTER — Telehealth: Payer: Self-pay | Admitting: Cardiovascular Disease

## 2018-06-21 NOTE — Telephone Encounter (Signed)
Pt c/o BP issue: STAT if pt c/o blurred vision, one-sided weakness or slurred speech  1. What are your last 5 BP readings?  Feb 1st 157/83 HR 63 Feb 2nd 160/70 HR 67 Feb 3rd 146/83 HR 59 Feb 5th 165/94 HR 54  2. Are you having any other symptoms (ex. Dizziness, headache, blurred vision, passed out)?   3. What is your BP issue? Patient had a cardioversion on 1/31 and was told to call in with BP readings.  Please review and call to discuss.

## 2018-06-21 NOTE — Telephone Encounter (Signed)
Patient denies any chest pain, dizziness, or palpitations. Still some shortness of breath with exertion. No other cardiac issues at this time.  Still having "stomach issues" such as diarrhea but not related to this. Routing to Dr Rockey Situ for him to review as he requested.

## 2018-06-24 NOTE — Telephone Encounter (Signed)
For high blood pressure, Would start losartan 50 mg daily Continue to monitor BP

## 2018-06-25 MED ORDER — LOSARTAN POTASSIUM 50 MG PO TABS: 50.0000 mg | ORAL_TABLET | Freq: Every day | ORAL | 3 refills | Status: DC

## 2018-06-25 NOTE — Telephone Encounter (Signed)
Patient returning call.

## 2018-06-25 NOTE — Telephone Encounter (Signed)
No answer. Left message to call back.   

## 2018-06-25 NOTE — Telephone Encounter (Signed)
Patient verbalized understanding to start losartan and continue to monitor BP. She is concerned about the cost. She will call her pharmacy and see how much it will cost and call us back if needed.

## 2018-06-26 DIAGNOSIS — R200 Anesthesia of skin: Secondary | ICD-10-CM | POA: Diagnosis not present

## 2018-06-26 NOTE — Progress Notes (Signed)
Patient: Lauren Lloyd Female    DOB: August 22, 1940   78 y.o.   MRN: [id number] Visit Date: 06/27/2018  Today's Provider: Lavon Paganini, MD   Chief Complaint  Patient presents with  . GI Problem   Subjective:    I, Tiburcio Pea, CMA, am acting as a scribe for Lavon Paganini, MD.    GI Problem  The primary symptoms include diarrhea. The illness began more than 7 days ago. The problem has been rapidly improving.  Patient reports she started taking Imodium daily and the diarrhea has improved. She denies constipation, and states she is having regular bowel movements at this time.   She underwent cardioversion recently.  She is followed by Cardiology and next appt is next week SOB is improving.  No palpitations.  Allergies  Allergen Reactions  . Levofloxacin     Other reaction(s): Joint Pains Other reaction(s): Other (See Comments) Joint pain  . Influenza Vaccines Other (See Comments)    Bell's Palsy  . Oysters [Shellfish Allergy] Swelling    She states she had eaten them three days in a row and she developed swelling around her eyes.      Current Outpatient Medications:  .  apixaban (ELIQUIS) 5 MG TABS tablet, Take 1 tablet (5 mg total) by mouth 2 (two) times daily., Disp: 180 tablet, Rfl: 3 .  diltiazem (CARDIZEM CD) 180 MG 24 hr capsule, Take 1 capsule (180 mg total) by mouth daily., Disp: 90 capsule, Rfl: 3 .  furosemide (LASIX) 40 MG tablet, Take 1 tablet (40 mg) by mouth twice daily, may take an extra 40 mg once daily as needed for swelling, Disp: , Rfl:  .  loperamide (IMODIUM) 2 MG capsule, Take by mouth., Disp: , Rfl:  .  losartan (COZAAR) 50 MG tablet, Take 1 tablet (50 mg total) by mouth daily., Disp: 90 tablet, Rfl: 3 .  metoprolol tartrate (LOPRESSOR) 50 MG tablet, Take 1 tablet (50 mg total) by mouth 2 (two) times daily., Disp: 180 tablet, Rfl: 3 .  Multiple Vitamin (MULTIVITAMIN) capsule, Take 1 capsule by mouth daily., Disp: , Rfl:  .  potassium  chloride (K-DUR) 10 MEQ tablet, Take 1 tablet (10 meq) by mouth twice daily, Disp: , Rfl:  .  VENTOLIN HFA 108 (90 Base) MCG/ACT inhaler, TAKE 1 TO 2 PUFFS EVERY 6 HOURS AS NEEDED FOR SHORTNESS OF BREATH (Patient not taking: Reported on 06/06/2018), Disp: 18 g, Rfl: 3  Review of Systems  Constitutional: Negative.   Respiratory: Negative.   Cardiovascular: Negative.   Gastrointestinal: Positive for diarrhea.  Musculoskeletal: Negative.     Social History   Tobacco Use  . Smoking status: Former Smoker    Packs/day: 1.00    Years: 30.00    Pack years: 30.00    Types: Cigarettes    Last attempt to quit: 05/16/1989    Years since quitting: 29.1  . Smokeless tobacco: Never Used  Substance Use Topics  . Alcohol use: Yes    Alcohol/week: 14.0 standard drinks    Types: 7 Glasses of wine, 7 Standard drinks or equivalent per week      Objective:   BP (!) 145/76 (BP Location: Right Arm, Patient Position: Sitting, Cuff Size: Large)   Pulse (!) 48   Temp 97.8 F (36.6 C) (Oral)   Wt 213 lb 3.2 oz (96.7 kg)   SpO2 96%   BMI 32.90 kg/m  Vitals:   06/27/18 1126  BP: (!) 145/76  Pulse: [name] Kitchen)  48  Temp: 97.8 F (36.6 C)  TempSrc: Oral  SpO2: 96%  Weight: 213 lb 3.2 oz (96.7 kg)     Physical Exam Vitals signs reviewed.  Constitutional:      General: She is not in acute distress.    Appearance: Normal appearance. She is not diaphoretic.  HENT:     Head: Normocephalic and atraumatic.     Mouth/Throat:     Pharynx: Oropharynx is clear.  Eyes:     General: No scleral icterus.    Conjunctiva/sclera: Conjunctivae normal.  Neck:     Musculoskeletal: Neck supple.  Cardiovascular:     Rate and Rhythm: Regular rhythm. Bradycardia present.     Pulses: Normal pulses.     Heart sounds: Normal heart sounds. No murmur.  Pulmonary:     Effort: Pulmonary effort is normal. No respiratory distress.     Breath sounds: Normal breath sounds. No wheezing or rhonchi.  Musculoskeletal:     Right  lower leg: No edema.     Left lower leg: No edema.  Lymphadenopathy:     Cervical: No cervical adenopathy.  Skin:    General: Skin is warm and dry.     Capillary Refill: Capillary refill takes less than 2 seconds.     Findings: No rash.  Neurological:     Mental Status: She is alert and oriented to person, place, and time. Mental status is at baseline.  Psychiatric:        Mood and Affect: Mood normal.        Behavior: Behavior normal.         Assessment & Plan   Problem List Items Addressed This Visit      Cardiovascular and Mediastinum   Hypertension    Elevated slightly Started losartan yesterday Monitor after stopping diltiazem      Persistent atrial fibrillation    Followed by cardiology [facility] today In sinus rhythm after recent cardioversion Will d/c diltiazem Continue eliquis and metoprolol        Digestive   IBS (irritable bowel syndrome) - Primary    Well controlled Continue imodium      Relevant Medications   loperamide (IMODIUM) 2 MG capsule       Return in about 6 months (around 12/26/2018) for CPE/AWV.   [name], [name]. Portions of this information were initially documented by Tiburcio Pea, CMA and reviewed by me for thoroughness and accuracy.    [name], MD, MPH [facility] 06/27/2018 1:18 PM

## 2018-06-27 ENCOUNTER — Encounter: Payer: Self-pay | Admitting: Family Medicine

## 2018-06-27 ENCOUNTER — Ambulatory Visit (INDEPENDENT_AMBULATORY_CARE_PROVIDER_SITE_OTHER): Payer: Medicare HMO | Admitting: Family Medicine

## 2018-06-27 VITALS — BP 145/76 | HR 48 | Temp 97.8°F | Wt 213.2 lb

## 2018-06-27 DIAGNOSIS — K580 Irritable bowel syndrome with diarrhea: Secondary | ICD-10-CM

## 2018-06-27 DIAGNOSIS — I4819 Other persistent atrial fibrillation: Secondary | ICD-10-CM

## 2018-06-27 DIAGNOSIS — I10 Essential (primary) hypertension: Secondary | ICD-10-CM | POA: Diagnosis not present

## 2018-06-27 NOTE — Patient Instructions (Signed)
Stop diltiazem Continue losartan and metoprolol

## 2018-06-27 NOTE — Assessment & Plan Note (Signed)
Followed by cardiology [facility] today In sinus rhythm after recent cardioversion Will d/c diltiazem Continue eliquis and metoprolol

## 2018-06-27 NOTE — Assessment & Plan Note (Signed)
Well controlled Continue imodium

## 2018-06-27 NOTE — Assessment & Plan Note (Signed)
Elevated slightly Started losartan yesterday Monitor after stopping diltiazem

## 2018-07-02 ENCOUNTER — Encounter: Payer: Self-pay | Admitting: Physician Assistant

## 2018-07-02 NOTE — Progress Notes (Signed)
Cardiology Office Note Date:  07/03/2018  Patient ID:  Lauren, Lloyd May 29, 1940, MRN [id number] PCP:  [name], MD  Cardiologist:  Dr. Rockey Situ, MD    Chief Complaint: Follow up  History of Present Illness: Lauren Lloyd is a 78 y.o. female with history of coronary calcifications on prior chest CT in 2016, recently diagnosed Afib in 04/2018 on Eliquis status post DCCV on 10/30/735, chronic diastolic CHF, pulmonary hypertension, chronic lower extremity swelling, HTN, asthma, IBS, and prior tobacco abuse quitting > 25 years prior who presents for follow up of her Afib.   She was admitted to the hospital in 04/2018 with worsening SOB, leg swelling, weight gain, and abdominal bloating. She was found to be in new onset Afib with RVR. She was rate controlled and diuresed with symptom improvement. Echo during that admission showed an EF of 55-60%, mild focal hypertrophy of the septum, no RWMA, mild MR, moderately dilated LA measuring 47 mm, mildly dilated RV with normal RVSF, severely dilated RA, PASP 34 mmHg. In hospital follow up on 05/11/18 she remained in Afib with well controlled ventricular response. She was most recently admitted to the hospital 1/15-1/16 for paresthesia. She underwent CT of the head and MRI of the brain which were not acute. Carotid artery ultrasound showed no hemodynamically significant stenosis. Repeat echo showed an EF of 55-60%, no rWMA, mild concentric LVH, mild to moderate MR, mild to moderately dilated LA, RVSF normal, moderate TR, PASP 51 mmHg. She was seen by neurology who did not feel the patient had a CVA or TIA. They felt the patient had a radiculopathy or underlying peripheral neuropathy with recommendation for outpatient neurology follow up.   She was seen in cardiology follow-up on 06/06/2018 and continued to note exertional shortness of breath with lower extremity swelling.  Weight was stable at 216 pounds.  She was compliant with her anticoagulation.   She remained in Afib with controlled ventricular response.  In the setting of her lower extremity swelling, her diltiazem was decreased to 180 mg daily with titration of Lopressor to 50 mg twice daily.  She underwent successful cardioversion on 06/15/2018 following 2 shocks.  Following this, she called the office on 06/21/2018 noting BP readings in the 106Y to 694W systolic with heart rate in the mid 80s to mid 60s bpm.  She also continued to note some exertional shortness of breath.  With this, the patient's primary cardiologist started her on losartan 50 mg daily.  She comes in doing reasonably well from a cardiac perspective.  She is maintaining sinus rhythm.  She does note an improvement in her shortness of breath though does continue to note some exertional dyspnea.  She reports this has been ongoing for many months.  No chest pain.  Since discontinuing diltiazem, her lower extremity swelling has resolved.  Her weight is down 2 pounds today when compared to her last office visit.  Of note, she has not yet taken any of her morning medications with a blood pressure of 112/60 and heart rate of 64 bpm.  No falls since she was last seen.  No BRBPR or melena.  No dizziness, presyncope, or syncope.   Past Medical History:  Diagnosis Date  . Arthritis    knees, Hands  . GERD (gastroesophageal reflux disease)   . Persistent atrial fibrillation    a.  Diagnosed 12/19; b. CHADS2VASc => 6 (CHF, HTN, age x 2, vascular disease, female); c. Eliquis    Past Surgical History:  Procedure Laterality Date  . CARDIOVERSION N/A 06/15/2018   Procedure: CARDIOVERSION (CATH LAB);  Surgeon: Minna Merritts, MD;  Location: [facility];  Service: Cardiovascular;  Laterality: N/A;  . CATARACT EXTRACTION W/PHACO Right 01/25/2016   Procedure: CATARACT EXTRACTION PHACO AND INTRAOCULAR LENS PLACEMENT ([place]);  Surgeon: Ronnell Freshwater, MD;  Location: [place];  Service: Ophthalmology;  Laterality: Right;  RIGHT    . CATARACT EXTRACTION W/PHACO Left 02/22/2016   Procedure: CATARACT EXTRACTION PHACO AND INTRAOCULAR LENS PLACEMENT ([facility]);  Surgeon: Ronnell Freshwater, MD;  Location: [place];  Service: Ophthalmology;  Laterality: Left;  LEFT  . [place]  2010  . KNEE ARTHROSCOPY Right 2004  . REPLACEMENT TOTAL KNEE Right 2009   [facility]  . SKIN GRAFT Left 04/08/2013   Done on left index finger  . TONSILLECTOMY  1946    Current Meds  Medication Sig  . apixaban (ELIQUIS) 5 MG TABS tablet Take 1 tablet (5 mg total) by mouth 2 (two) times daily.  . furosemide (LASIX) 40 MG tablet Take 1 tablet (40 mg) by mouth twice daily, may take an extra 40 mg once daily as needed for swelling  . loperamide (IMODIUM) 2 MG capsule Take by mouth.  . losartan (COZAAR) 50 MG tablet Take 1 tablet (50 mg total) by mouth daily.  . metoprolol tartrate (LOPRESSOR) 50 MG tablet Take 1 tablet (50 mg total) by mouth 2 (two) times daily.  . Multiple Vitamin (MULTIVITAMIN) capsule Take 1 capsule by mouth daily.  . potassium chloride (K-DUR) 10 MEQ tablet Take 1 tablet (10 meq) by mouth twice daily    Allergies:   Levofloxacin; Influenza vaccines; and Oysters [shellfish allergy]   Social History:  The patient  reports that she quit smoking about 29 years ago. Her smoking use included cigarettes. She has a 30.00 pack-year smoking history. She has never used smokeless tobacco. She reports current alcohol use of about 14.0 standard drinks of alcohol per week. She reports that she does not use drugs.   Family History:  [name] family history includes Atrial fibrillation in her sister and sister; Breast cancer (age of onset: 59) in her sister; Healthy in her brother; Heart attack in her father; Hyperlipidemia in her sister and sister; Transient ischemic attack in her mother.  ROS:   Review of Systems  Constitutional: Positive for malaise/fatigue. Negative for chills, diaphoresis,  fever and weight loss.  HENT: Negative for congestion.   Eyes: Negative for discharge and redness.  Respiratory: Positive for shortness of breath. Negative for cough, hemoptysis, sputum production and wheezing.   Cardiovascular: Negative for chest pain, palpitations, orthopnea, claudication, leg swelling and PND.  Gastrointestinal: Negative for abdominal pain, blood in stool, heartburn, melena, nausea and vomiting.  Genitourinary: Negative for hematuria.  Musculoskeletal: Negative for falls and myalgias.  Skin: Negative for rash.  Neurological: Negative for dizziness, tingling, tremors, sensory change, speech change, focal weakness, loss of consciousness and weakness.  Endo/Heme/Allergies: Does not bruise/bleed easily.  Psychiatric/Behavioral: Negative for substance abuse. The patient is not nervous/anxious.   All other systems reviewed and are negative.    PHYSICAL EXAM:  VS:  BP 112/60 (BP Location: Left Arm, Patient Position: Sitting, Cuff Size: Normal)   Pulse 64   Ht 5' 7.5" (1.715 m)   Wt 214 lb 8 oz (97.3 kg)   BMI 33.10 kg/m  BMI: Body mass index is 33.1 kg/m.  Physical Exam  Constitutional: She is oriented to person, place,  and time. She appears well-developed and well-nourished.  HENT:  Head: Normocephalic and atraumatic.  Eyes: Right eye exhibits no discharge. Left eye exhibits no discharge.  Neck: Normal range of motion. No JVD present.  Cardiovascular: Normal rate, regular rhythm, S1 normal, S2 normal and normal heart sounds. Exam reveals no distant heart sounds, no friction rub, no midsystolic click and no opening snap.  No murmur heard. Pulses:      Dorsalis pedis pulses are 2+ on the right side and 2+ on the left side.       Posterior tibial pulses are 2+ on the right side and 2+ on the left side.  Pulmonary/Chest: Effort normal and breath sounds normal. No respiratory distress. She has no decreased breath sounds. She has no wheezes. She has no rales. She exhibits  no tenderness.  Abdominal: Soft. She exhibits no distension. There is no abdominal tenderness.  Musculoskeletal:        General: No edema.  Neurological: She is alert and oriented to person, place, and time.  Skin: Skin is warm and dry. No cyanosis. Nails show no clubbing.  Psychiatric: She has a normal mood and affect. Her speech is normal and behavior is normal. Judgment and thought content normal.     EKG:  Was ordered and interpreted by me today. Shows NSR, 64 bpm, no acute st/t changes   Recent Labs: 05/03/2018: B Natriuretic Peptide 376.0 05/04/2018: ALT 62; Magnesium 2.6; TSH 1.615 06/06/2018: BUN 19; Creatinine, Ser 1.03; Hemoglobin 14.0; Platelets 279; Potassium 4.6; Sodium 144  05/31/2018: Cholesterol 180; HDL 50; LDL Cholesterol 109; Total CHOL/HDL Ratio 3.6; Triglycerides 107; VLDL 21   CrCl cannot be calculated (Patient's most recent lab result is older than the maximum 21 days allowed.).   Wt Readings from Last 3 Encounters:  07/03/18 214 lb 8 oz (97.3 kg)  06/27/18 213 lb 3.2 oz (96.7 kg)  06/15/18 216 lb (98 kg)     Other studies reviewed: Additional studies/records reviewed today include: summarized above  ASSESSMENT AND PLAN:  1. Persistent Afib: Status post successful DCCV on 06/15/2018.  Maintaining sinus rhythm with a heart rate in the 60s bpm, without rate controlling medication this morning.  In this setting, along with her shortness of breath, and documented heart rate of 48 bpm on 06/27/2018 I will decrease her Lopressor to 25 mg twice daily.  She is no longer taking diltiazem and as noted resolution of her lower extremity swelling.  Continue Eliquis 5 mg bid given her CHADS2VASc of at least 6 (CHF, HTN, age x 2, vascular disease, female).    2. Chronic diastolic CHF/pulmonary hypertension/dyspnea: Her pulmonary pretension is likely in setting of prolonged tobacco abuse with 30 pack years.  Recent renal function from 06/06/2018 showed slightly elevated serum  creatinine with recommendation to decrease Lasix to 20 mg daily at that time.  Weight is down 2 pounds today from 06/06/2018 visit.  Exertional dyspnea persists though is improved following restoration of sinus rhythm.  We will schedule the patient for a Lexiscan Myoview to evaluate for high risk ischemia.  She is uncertain if she has been taking Lasix 20 mg daily or 40 mg daily.  She will check when she gets home and notify us.  Check BMP to ensure stable renal function and that no adjustment of her diuretic needs to be undertaken.  Again, recommend she follow-up with pulmonology given her pulmonary hypertension which is likely in the setting of underlying COPD.  3. Coronary artery calcification: No chest  pain.  Continues to note exertional dyspnea though this is improved.  Undertaking [employer] as outlined above.  On Eliquis in place of aspirin.  Recent LDL of 109 from 05/31/2018.  If she is found to have an abnormal Myoview would recommend addition of statin therapy to optimize risk factors.  4. Lower extremity edema: Resolved following discontinuation of diltiazem.  Would continue to attempt to avoid diltiazem however if the patient's dyspnea persists on beta-blocker we may need to revisit lower dose calcium channel blocker for rate control.  5. COPD: No current exacerbation.  Continue to recommend to follow-up with pulmonology.  6. HTN: Blood pressure is well controlled today.  I do wonder if her home blood pressure cuff is inaccurate as she reports consistent readings at home in the 539N to 225Y systolic with more recent readings in our office in the 1 teens systolic.  Given the fact that her blood pressure is 112/60 with a heart rate of 64 bpm this morning without the patient taking any losartan or metoprolol, I will decrease her metoprolol to 25 mg twice daily and see if this will also help with her shortness of breath.  Of note, recent visit to outside office on 06/27/2018 showed a documented  heart rate of 48 bpm.  In this setting, metoprolol is being decreased as above.  7. Paresthesias: Saw neurology on 06/26/2018.  He declined work-up.  Disposition: F/u with Dr. Rockey Situ or an APP in 1 month.  Current medicines are reviewed at length with the patient today.  The patient did not have any concerns regarding medicines.  Signed, Christell Faith, PA-C 07/03/2018 8:25 AM     [address] [facility] [place], [place] [address] [phone]

## 2018-07-03 ENCOUNTER — Ambulatory Visit: Payer: Medicare HMO | Admitting: Physician Assistant

## 2018-07-03 ENCOUNTER — Ambulatory Visit: Payer: Self-pay

## 2018-07-03 ENCOUNTER — Encounter: Payer: Self-pay | Admitting: Physician Assistant

## 2018-07-03 VITALS — BP 112/60 | HR 64 | Ht 67.5 in | Wt 214.5 lb

## 2018-07-03 DIAGNOSIS — R600 Localized edema: Secondary | ICD-10-CM

## 2018-07-03 DIAGNOSIS — R0600 Dyspnea, unspecified: Secondary | ICD-10-CM

## 2018-07-03 DIAGNOSIS — I2510 Atherosclerotic heart disease of native coronary artery without angina pectoris: Secondary | ICD-10-CM | POA: Diagnosis not present

## 2018-07-03 DIAGNOSIS — J432 Centrilobular emphysema: Secondary | ICD-10-CM | POA: Diagnosis not present

## 2018-07-03 DIAGNOSIS — I10 Essential (primary) hypertension: Secondary | ICD-10-CM | POA: Diagnosis not present

## 2018-07-03 DIAGNOSIS — I4819 Other persistent atrial fibrillation: Secondary | ICD-10-CM | POA: Diagnosis not present

## 2018-07-03 DIAGNOSIS — I2584 Coronary atherosclerosis due to calcified coronary lesion: Secondary | ICD-10-CM | POA: Diagnosis not present

## 2018-07-03 DIAGNOSIS — R0609 Other forms of dyspnea: Secondary | ICD-10-CM | POA: Diagnosis not present

## 2018-07-03 DIAGNOSIS — I5032 Chronic diastolic (congestive) heart failure: Secondary | ICD-10-CM

## 2018-07-03 DIAGNOSIS — I272 Pulmonary hypertension, unspecified: Secondary | ICD-10-CM

## 2018-07-03 MED ORDER — METOPROLOL TARTRATE 25 MG PO TABS: 25.0000 mg | ORAL_TABLET | Freq: Two times a day (BID) | ORAL | 3 refills | Status: DC

## 2018-07-03 NOTE — Patient Instructions (Signed)
Medication Instructions: Your physician has recommended you make the following change in your medication:  1- DECREASE Lopressor Take 1 tablet (25 mg total) by mouth 2 (two) times daily 2- Eliquis Samples have been provided for you 2 Boxes 5 mg Lot [id number] Exp 10/2020  If you need a refill on your cardiac medications before your next appointment, please call your pharmacy.   Lab work: Your physician recommends that you return for lab work today Artist)  If you have labs (blood work) drawn today and your tests are completely normal, you will receive your results only by: [name] Kitchen MyChart Message (if you have MyChart) OR . A paper copy in the mail If you have any lab test that is abnormal or we need to change your treatment, we will call you to review the results.  Testing/Procedures: 1- Lexiscan/Myoview [facility] MYOVIEW  Your caregiver has ordered a Stress Test with nuclear imaging. The purpose of this test is to evaluate the blood supply to your heart muscle. This procedure is referred to as a "Non-Invasive Stress Test." This is because other than having an IV started in your vein, nothing is inserted or "invades" your body. Cardiac stress tests are done to find areas of poor blood flow to the heart by determining the extent of coronary artery disease (CAD). Some patients exercise on a treadmill, which naturally increases the blood flow to your heart, while others who are  unable to walk on a treadmill due to physical limitations have a pharmacologic/chemical stress agent called Lexiscan . This medicine will mimic walking on a treadmill by temporarily increasing your coronary blood flow.   Please note: these test may take anywhere between 2-4 hours to complete  PLEASE REPORT TO [facility] AT THE FIRST DESK WILL DIRECT YOU WHERE TO GO  Date of Procedure:_____________________________________  Arrival Time for Procedure:______________________________  Instructions  regarding medication:   ___x_ : Hold fluid pill medication morning of procedure  __x__:  Hold betablocker(s) night before procedure and morning of procedure  ______________________________________________  PLEASE NOTIFY THE OFFICE AT LEAST 24 HOURS IN ADVANCE IF YOU ARE UNABLE TO KEEP YOUR APPOINTMENT.  [phone] AND  PLEASE NOTIFY NUCLEAR MEDICINE AT [facility] AT LEAST 24 HOURS IN ADVANCE IF YOU ARE UNABLE TO KEEP YOUR APPOINTMENT. [phone]  How to prepare for your Myoview test:  1. Do not eat or drink after midnight 2. No caffeine for 24 hours prior to test 3. No smoking 24 hours prior to test. 4. Your medication may be taken with water.  If your doctor stopped a medication because of this test, do not take that medication. 5. Ladies, please do not wear dresses.  Skirts or pants are appropriate. Please wear a short sleeve shirt. 6. No perfume, cologne or lotion. 7. Wear comfortable walking shoes. No heels!  Follow-Up: At [facility], you and your health needs are our priority.  As part of our continuing mission to provide you with exceptional heart care, we have created designated Provider Care Teams.  These Care Teams include your primary Cardiologist (physician) and Advanced Practice Providers (APPs -  Physician Assistants and Nurse Practitioners) who all work together to provide you with the care you need, when you need it. You will need a follow up appointment in 1 months.   You may see Dr. Rockey Situ or Christell Faith, PA-C

## 2018-07-04 LAB — BASIC METABOLIC PANEL
BUN/Creatinine Ratio: 19 (ref 12–28)
BUN: 15 mg/dL (ref 8–27)
CO2: 19 mmol/L — ABNORMAL LOW (ref 20–29)
Calcium: 9.4 mg/dL (ref 8.7–10.3)
Chloride: 104 mmol/L (ref 96–106)
Creatinine, Ser: 0.81 mg/dL (ref 0.57–1.00)
GFR calc Af Amer: 80 mL/min/{1.73_m2} (ref >59)
GFR calc non Af Amer: 70 mL/min/{1.73_m2} (ref >59)
Glucose: 103 mg/dL — ABNORMAL HIGH (ref 65–99)
Potassium: 4.6 mmol/L (ref 3.5–5.2)
Sodium: 141 mmol/L (ref 134–144)

## 2018-07-09 ENCOUNTER — Telehealth: Payer: Self-pay | Admitting: *Deleted

## 2018-07-09 ENCOUNTER — Ambulatory Visit: Payer: Medicare HMO

## 2018-07-09 NOTE — Telephone Encounter (Signed)
Patient here to have her home blood pressure checked checked for accuracy. Home BP 136/75, HR 56. Manual by nurse  130/68, HR 57.  Patient has no other complaints or symptoms at this time. We discussed the time of day she takes her BP. Prior to leaving house it was in the 160's/70's; however, she had taken her BP medication about 30 minutes early. Recommended to her to take BP about 2 hours after medications and see if this helps the disparity. She was appreciative.  Routing to [employer] as [employer].

## 2018-07-09 NOTE — Telephone Encounter (Signed)
Patient's BP cuff is accurate and on par with office BP cuff.  Agree with RN recommendation for patient to check BP approximately 2 hours after taking medications and would avoid checking BP prior to medications or shortly after.  Keep scheduled follow-up.

## 2018-07-12 ENCOUNTER — Ambulatory Visit
Admission: RE | Admit: 2018-07-12 | Discharge: 2018-07-12 | Disposition: A | Discharge status: Home / Self Care | Payer: Medicare HMO | Source: Ambulatory Visit | Attending: Physician Assistant | Admitting: Physician Assistant

## 2018-07-12 DIAGNOSIS — R0609 Other forms of dyspnea: Secondary | ICD-10-CM | POA: Diagnosis not present

## 2018-07-12 DIAGNOSIS — R0600 Dyspnea, unspecified: Secondary | ICD-10-CM

## 2018-07-12 LAB — NM MYOCAR MULTI W/SPECT W/WALL MOTION / EF
CHL CUP RESTING HR STRESS: 54 {beats}/min
CSEPED: 0 min
Estimated workload: 1 METS
Exercise duration (sec): 0 s
LV dias vol: 83 mL (ref 46–106)
LV sys vol: 27 mL
MPHR: 142 {beats}/min
Peak HR: 82 {beats}/min
Percent HR: 57 %
SDS: 0
SRS: 5
SSS: 0

## 2018-07-12 MED ORDER — TECHNETIUM TC 99M TETROFOSMIN IV KIT
10.0000 | PACK | Freq: Once | INTRAVENOUS | Status: AC | PRN
  Administered 2018-07-12: 11.02 via INTRAVENOUS

## 2018-07-12 MED ORDER — TECHNETIUM TC 99M TETROFOSMIN IV KIT
30.0000 | PACK | Freq: Once | INTRAVENOUS | Status: AC | PRN
  Administered 2018-07-12: 30.87 via INTRAVENOUS

## 2018-07-12 MED ORDER — REGADENOSON 0.4 MG/5ML IV SOLN
0.4000 mg | Freq: Once | INTRAVENOUS | Status: AC
  Administered 2018-07-12: 0.4 mg via INTRAVENOUS

## 2018-07-24 ENCOUNTER — Telehealth: Payer: Self-pay | Admitting: Family Medicine

## 2018-07-24 NOTE — Telephone Encounter (Signed)
Her dose of Eliquis is 5mg  BID and we can give 1 month supply

## 2018-07-24 NOTE — Telephone Encounter (Signed)
We can give her samples of Eliquis.  Loperamide is generic for imodium and available OTC in store brands and should be pretty cheap.  Can even buy it at [facility] or [facility]

## 2018-07-24 NOTE — Telephone Encounter (Signed)
Patient advised samples left at the front desk for pick up.

## 2018-07-24 NOTE — Telephone Encounter (Signed)
Pt is needing 2 week supply of  Eliquis samples - this was discuss with Dr. Jacinto Reap.  Pt is taking Imodium for bowel issues.  Pt asking if there is a generic Rx that can be prescribed due to otc cost.   Please Leave a VM if she cannot pick up her cell at [phone].   Please advise.  Thanks, [employer]

## 2018-07-25 NOTE — Progress Notes (Signed)
[facility] [address] [place], [place] [address] Phone: [phone] Subjective:   I [name] am serving as a Education administrator for Dr. Hulan Saas.  I'm seeing this patient by the request  of:  Bacigalupo, Dionne Bucy, MD   CC: Low back pain  OBS:[id number]  Lauren Lloyd is a 78 y.o. female coming in with complaint of back pain. Can't take aleve. Knees painful as well. States she was pretty active until A-Fib.   Onset- chronic  Location- lower back/ hip Duration-  Character- sharp, dull, achy, sore, throbbing Aggravating factors- flexion, ADLs, standing long periods of time  Reliving factors-  Therapies tried- tylenol Severity-5 out of 10     Past Medical History:  Diagnosis Date  . Arthritis    knees, Hands  . GERD (gastroesophageal reflux disease)   . Persistent atrial fibrillation    a.  Diagnosed 12/19; b. CHADS2VASc => 6 (CHF, HTN, age x 2, vascular disease, female); c. Eliquis   Past Surgical History:  Procedure Laterality Date  . CARDIOVERSION N/A 06/15/2018   Procedure: CARDIOVERSION (CATH LAB);  Surgeon: Minna Merritts, MD;  Location: [facility];  Service: Cardiovascular;  Laterality: N/A;  . CATARACT EXTRACTION W/PHACO Right 01/25/2016   Procedure: CATARACT EXTRACTION PHACO AND INTRAOCULAR LENS PLACEMENT ([place]);  Surgeon: Ronnell Freshwater, MD;  Location: [facility];  Service: Ophthalmology;  Laterality: Right;  RIGHT  . CATARACT EXTRACTION W/PHACO Left 02/22/2016   Procedure: CATARACT EXTRACTION PHACO AND INTRAOCULAR LENS PLACEMENT ([facility]);  Surgeon: Ronnell Freshwater, MD;  Location: [place];  Service: Ophthalmology;  Laterality: Left;  LEFT  . [place]  2010  . KNEE ARTHROSCOPY Right 2004  . REPLACEMENT TOTAL KNEE Right 2009   [facility]  . SKIN GRAFT Left 04/08/2013   Done on left index finger  . TONSILLECTOMY  1946   Social History   Socioeconomic History  . Marital  status: Divorced    Spouse name: Not on file  . Number of children: 5  . Years of education: college  . Highest education level: Bachelor's degree (e.g., BA, AB, BS)  Occupational History  . Occupation: Part Time    Employer: [employer]  Social Needs  . Financial resource strain: Not hard at all  . Food insecurity:    Worry: Never true    Inability: Never true  . Transportation needs:    Medical: No    Non-medical: No  Tobacco Use  . Smoking status: Former Smoker    Packs/day: 1.00    Years: 30.00    Pack years: 30.00    Types: Cigarettes    Last attempt to quit: 05/16/1989    Years since quitting: 29.2  . Smokeless tobacco: Never Used  Substance and Sexual Activity  . Alcohol use: Yes    Alcohol/week: 14.0 standard drinks    Types: 7 Glasses of wine, 7 Standard drinks or equivalent per week  . Drug use: No  . Sexual activity: Not Currently  Lifestyle  . Physical activity:    Days per week: Not on file    Minutes per session: Not on file  . Stress: Not at all  Relationships  . Social connections:    Talks on phone: Not on file    Gets together: Not on file    Attends religious service: Not on file    Active member of club or organization: Not on file    Attends  meetings of clubs or organizations: Not on file    Relationship status: Not on file  Other Topics Concern  . Not on file  Social History Narrative   Pt has a child who passed away at age 16   Allergies  Allergen Reactions  . Levofloxacin     Other reaction(s): Joint Pains Other reaction(s): Other (See Comments) Joint pain  . Influenza Vaccines Other (See Comments)    Bell's Palsy  . Oysters [Shellfish Allergy] Swelling    She states she had eaten them three days in a row and she developed swelling around her eyes.    Family History  Problem Relation Age of Onset  . Hyperlipidemia Sister   . Atrial fibrillation Sister   . Transient ischemic attack Mother   . Heart attack Father   .  Healthy Brother   . Breast cancer Sister 75  . Hyperlipidemia Sister   . Atrial fibrillation Sister      Current Outpatient Medications (Cardiovascular):  .  furosemide (LASIX) 40 MG tablet, Take 1 tablet (40 mg) by mouth twice daily, may take an extra 40 mg once daily as needed for swelling .  losartan (COZAAR) 50 MG tablet, Take 1 tablet (50 mg total) by mouth daily. .  metoprolol tartrate (LOPRESSOR) 25 MG tablet, Take 1 tablet (25 mg total) by mouth 2 (two) times daily.  Current Outpatient Medications (Respiratory):  [name] Kitchen  VENTOLIN HFA 108 (90 Base) MCG/ACT inhaler, TAKE 1 TO 2 PUFFS EVERY 6 HOURS AS NEEDED FOR SHORTNESS OF BREATH   Current Outpatient Medications (Hematological):  .  apixaban (ELIQUIS) 5 MG TABS tablet, Take 1 tablet (5 mg total) by mouth 2 (two) times daily.  Current Outpatient Medications (Other):  .  loperamide (IMODIUM) 2 MG capsule, Take by mouth. .  Multiple Vitamin (MULTIVITAMIN) capsule, Take 1 capsule by mouth daily. .  potassium chloride (K-DUR) 10 MEQ tablet, Take 1 tablet (10 meq) by mouth twice daily .  gabapentin (NEURONTIN) 100 MG capsule, Take 2 capsules (200 mg total) by mouth at bedtime. .  Vitamin D, Ergocalciferol, (DRISDOL) 1.25 MG (50000 UT) CAPS capsule, Take 1 capsule (50,000 Units total) by mouth every 7 (seven) days.    Past medical history, social, surgical and family history all reviewed in electronic medical record.  No pertanent information unless stated regarding to the chief complaint.   Review of Systems:  No headache, visual changes, nausea, vomiting, diarrhea, constipation, dizziness, abdominal pain, skin rash, fevers, chills, night sweats, weight loss, swollen lymph nodes, body aches, joint swelling, chest pain, shortness of breath, mood changes.  Positive muscle aches  Objective  Blood pressure (!) 150/70, pulse 68, height 5' 7.5" (1.715 m), weight 215 lb (97.5 kg), SpO2 95 %.    General: No apparent distress alert and  oriented x3 mood and affect normal, dressed appropriately.  HEENT: Pupils equal, extraocular movements intact  Respiratory: Patient's speak in full sentences and does not appear short of breath  Cardiovascular: Trace lower extremity edema, non tender, no erythema  Skin: Warm dry intact with no signs of infection or rash on extremities or on axial skeleton.  Abdomen: Soft nontender  Neuro: Cranial nerves II through XII are intact, neurovascularly intact in all extremities with 2+ DTRs and 2+ pulses.  Lymph: No lymphadenopathy of posterior or anterior cervical chain or axillae bilaterally.  Gait antalgic gait MSK:  tender with mild limited range of motion and good stability and symmetric strength and tone of shoulders, elbows, wrist, hip,  and ankles bilaterally.   Knee: Left valgus deformity noted. Large thigh to calf ratio.  Tender to palpation over medial and PF joint line.  ROM full in flexion and extension and lower leg rotation. instability with valgus force.  painful patellar compression. Patellar glide with moderate crepitus. Patellar and quadriceps tendons unremarkable. Hamstring and quadriceps strength is normal. Contralateral knee shows placement noted   Back exam has loss of lordosis.  Poor core strength noted.  Patient has severe tightness with Corky Sox test bilaterally.  Patient also has tenderness in the paraspinal musculature but no spinous process tenderness.  Negative straight leg test but tightness of the hamstrings.  4+ out of 5 strength of the lower extremities bilaterally but symmetric  [id number]; 15 additional minutes spent for Therapeutic exercises as stated in above notes.  This included exercises focusing on stretching, strengthening, with significant focus on eccentric aspects.   Long term goals include an improvement in range of motion, strength, endurance as well as avoiding reinjury. Patient's frequency would include in 1-2 times a day, 3-5 times a week for a duration of  6-12 weeks. Low back exercises that included:  Pelvic tilt/bracing instruction to focus on control of the pelvic girdle and lower abdominal muscles  Glute strengthening exercises, focusing on proper firing of the glutes without engaging the low back muscles Proper stretching techniques for maximum relief for the hamstrings, hip flexors, low back and some rotation where tolerated  Proper technique shown and discussed handout in great detail with ATC.  All questions were discussed and answered.   After informed written and verbal consent, patient was seated on exam table. Left knee was prepped with alcohol swab and utilizing anterolateral approach, patient's left knee space was injected with 4:1  marcaine 0.5%: Kenalog 40mg /dL. Patient tolerated the procedure well without immediate complications.   Impression and Recommendations:     This case required medical decision making of moderate complexity. The above documentation has been reviewed and is accurate and complete Lyndal Pulley, DO       Note: This dictation was prepared with Dragon dictation along with smaller phrase technology. Any transcriptional errors that result from this process are unintentional.

## 2018-07-26 ENCOUNTER — Encounter: Payer: Self-pay | Admitting: Family Medicine

## 2018-07-26 ENCOUNTER — Ambulatory Visit (INDEPENDENT_AMBULATORY_CARE_PROVIDER_SITE_OTHER)
Admission: RE | Admit: 2018-07-26 | Discharge: 2018-07-26 | Disposition: A | Discharge status: Home / Self Care | Payer: Medicare HMO | Source: Ambulatory Visit | Attending: Family Medicine | Admitting: Family Medicine

## 2018-07-26 ENCOUNTER — Other Ambulatory Visit: Payer: Self-pay

## 2018-07-26 ENCOUNTER — Ambulatory Visit: Payer: Medicare HMO | Admitting: Family Medicine

## 2018-07-26 VITALS — BP 150/70 | HR 68 | Ht 67.5 in | Wt 215.0 lb

## 2018-07-26 DIAGNOSIS — M1712 Unilateral primary osteoarthritis, left knee: Secondary | ICD-10-CM | POA: Insufficient documentation

## 2018-07-26 DIAGNOSIS — M545 Low back pain, unspecified: Secondary | ICD-10-CM

## 2018-07-26 DIAGNOSIS — M4802 Spinal stenosis, cervical region: Secondary | ICD-10-CM | POA: Diagnosis not present

## 2018-07-26 DIAGNOSIS — G8929 Other chronic pain: Secondary | ICD-10-CM

## 2018-07-26 MED ORDER — VITAMIN D (ERGOCALCIFEROL) 1.25 MG (50000 UNIT) PO CAPS: 50000.0000 [IU] | ORAL_CAPSULE | ORAL | 0 refills | Status: DC

## 2018-07-26 MED ORDER — GABAPENTIN 100 MG PO CAPS: 200.0000 mg | ORAL_CAPSULE | Freq: Every day | ORAL | 3 refills | Status: DC

## 2018-07-26 NOTE — Patient Instructions (Addendum)
Good to see you  Ice 20 minutes 2 times daily. Usually after activity and before bed. Exercises 3 times a week.  Injected left knee today  Will get approval for gel injection  Xrays of the back  Gabapentin 200mg  at night Once weekly vitamin D for 12 weeks See me again in 6 weeks

## 2018-07-26 NOTE — Assessment & Plan Note (Signed)
Patient found to have severe arthritic changes of the knee as well.  Likely also causing some of the exacerbation of the back pain.  Has had a contralateral knee replacement.  My guess is near bone-on-bone.  Responded well to the injection today and hopefully will do well.  Could be a candidate for Visco supplementation.  Due to patient's abnormal thigh to calf ratio will also be fitted for an OA stability brace.  Follow-up with me again in 4 to 6 weeks

## 2018-07-26 NOTE — Assessment & Plan Note (Signed)
Patient does have degenerative spinal stenosis.  MRI in 2013 did show this.  Has had no surgical intervention and I believe the patient is having anxiety exacerbation of the underlying condition.  Patient wants to avoid any type of prednisone.  Discussed icing regimen and home exercises, we discussed core strengthening.  X-rays ordered today for further evaluation.  Gabapentin prescribed.  Follow-up again in 4 to 6 weeks.

## 2018-08-03 ENCOUNTER — Ambulatory Visit: Payer: Self-pay | Admitting: Cardiovascular Disease

## 2018-08-13 ENCOUNTER — Telehealth: Payer: Self-pay

## 2018-08-13 NOTE — Telephone Encounter (Signed)
Left voicemail message requesting for patient call back to discuss changing current appointment to an e-visit.

## 2018-08-16 ENCOUNTER — Telehealth: Payer: Self-pay

## 2018-08-16 NOTE — Telephone Encounter (Signed)
Spoke with patient.  Offered video visit since she has a smartphone.  She had to get off the phone and said she would call back.  Awaiting call back

## 2018-08-17 NOTE — Telephone Encounter (Signed)
Virtual Visit Pre-Appointment Phone Call  Steps For Call:  1. Confirm consent - "In the setting of the current Covid19 crisis, you are scheduled for a VIDEO visit with your provider on 08/28/2018 at 8:20am.  Just as we do with many in-office visits, in order for you to participate in this visit, we must obtain consent.  If you'd like, I can send this to your mychart (if signed up) or email for you to review.  Otherwise, I can obtain your verbal consent now.  All virtual visits are billed to your insurance company just like a normal visit would be.  By agreeing to a virtual visit, we'd like you to understand that the technology does not allow for your provider to perform an examination, and thus may limit your provider's ability to fully assess your condition.  Finally, though the technology is pretty good, we cannot assure that it will always work on either your or our end, and in the setting of a video visit, we may have to convert it to a phone-only visit.  In either situation, we cannot ensure that we have a secure connection.  Are you willing to proceed?"  2. Give patient instructions for WebEx download to smartphone as below if video visit  3. Advise patient to be prepared with any vital sign or heart rhythm information, their current medicines, and a piece of paper and pen handy for any instructions they may receive the day of their visit  4. Inform patient they will receive a phone call 15 minutes prior to their appointment time (may be from unknown caller ID) so they should be prepared to answer  5. Confirm that appointment type is correct in Epic appointment notes (video vs telephone)    TELEPHONE CALL NOTE  Lauren Lloyd has been deemed a candidate for a follow-up tele-health visit to limit community exposure during the Covid-19 pandemic. I spoke with the patient via phone to ensure availability of phone/video source, confirm preferred email & phone number, and discuss instructions  and expectations.  I reminded Lauren Lloyd to be prepared with any vital sign and/or heart rhythm information that could potentially be obtained via home monitoring, at the time of her visit. I reminded Lauren Lloyd to expect a phone call at the time of her visit if her visit.  Did the patient verbally acknowledge consent to treatment? [place], [place] 08/17/2018 11:23 AM   CONSENT FOR TELE-HEALTH VISIT - PLEASE REVIEW  I hereby voluntarily request, consent and authorize [facility] and its employed or contracted physicians, physician assistants, nurse practitioners or other licensed health care professionals (the Practitioner), to provide me with telemedicine health care services (the Services") as deemed necessary by the treating Practitioner. I acknowledge and consent to receive the Services by the Practitioner via telemedicine. I understand that the telemedicine visit will involve communicating with the Practitioner through live audiovisual communication technology and the disclosure of certain medical information by electronic transmission. I acknowledge that I have been given the opportunity to request an in-person assessment or other available alternative prior to the telemedicine visit and am voluntarily participating in the telemedicine visit.  I understand that I have the right to withhold or withdraw my consent to the use of telemedicine in the course of my care at any time, without affecting my right to future care or treatment, and that the Practitioner or I may terminate the telemedicine visit at any time. I understand that I  have the right to inspect all information obtained and/or recorded in the course of the telemedicine visit and may receive copies of available information for a reasonable fee.  I understand that some of the potential risks of receiving the Services via telemedicine include:   Delay or interruption in medical evaluation due to technological equipment  failure or disruption;  Information transmitted may not be sufficient (e.g. poor resolution of images) to allow for appropriate medical decision making by the Practitioner; and/or   In rare instances, security protocols could fail, causing a breach of personal health information.  Furthermore, I acknowledge that it is my responsibility to provide information about my medical history, conditions and care that is complete and accurate to the best of my ability. I acknowledge that Practitioner's advice, recommendations, and/or decision may be based on factors not within their control, such as incomplete or inaccurate data provided by me or distortions of diagnostic images or specimens that may result from electronic transmissions. I understand that the practice of medicine is not an exact science and that Practitioner makes no warranties or guarantees regarding treatment outcomes. I acknowledge that I will receive a copy of this consent concurrently upon execution via email to the email address I last provided but may also request a printed copy by calling the office of [facility].    I understand that my insurance will be billed for this visit.   I have read or had this consent read to me.  I understand the contents of this consent, which adequately explains the benefits and risks of the Services being provided via telemedicine.   I have been provided ample opportunity to ask questions regarding this consent and the Services and have had my questions answered to my satisfaction.  I give my informed consent for the services to be provided through the use of telemedicine in my medical care  By participating in this telemedicine visit I agree to the above.'

## 2018-08-27 ENCOUNTER — Telehealth: Payer: Self-pay

## 2018-08-27 NOTE — Telephone Encounter (Signed)
Lauren Lloyd, [employer] rep, spoke with patient stating in an email on 08/27/2018: "She doesn't want the brace right now. She said if after her follow up Dr Tamala Julian still suggest a knee brace she will get it then."

## 2018-08-27 NOTE — Progress Notes (Addendum)
Virtual Visit via Telephone Note   This visit type was conducted due to national recommendations for restrictions regarding the COVID-19 Pandemic (e.g. social distancing) in an effort to limit this patient's exposure and mitigate transmission in our community.  Due to her co-morbid illnesses, this patient is at least at moderate risk for complications without adequate follow up.  This format is felt to be most appropriate for this patient at this time.  The patient did not have access to video technology/had technical difficulties with video requiring transitioning to audio format only (telephone).  All issues noted in this document were discussed and addressed.  No physical exam could be performed with this format.  Please refer to the patient's chart for her  consent to telehealth for [facility]    Date:  08/27/2018   ID:  Lauren, Lloyd Jul 28, 1940, MRN [id number]  Patient Location:  [address] [place] [address]   Provider location:   [facility], [place] office  PCP:  Lauren Crews, MD  Cardiologist:  Lauren Lloyd, [place]  Chief Complaint:  SOB, back pain knee pain   History of Present Illness:    Lauren Lloyd is a 78 y.o. female who presents via audio/video conferencing for a telehealth visit today.   The patient does not symptoms concerning for COVID-19 infection (fever, chills, cough, or new SHORTNESS OF BREATH).   Patient has a past medical history of hypertension,  asthma  Chronic lower extremity swelling Hospital admission for atrial fibrillation December 2019 Associated acute diastolic CHF Status post successful DCCV on 06/15/2018.  NSR on 06/2018 Who presents for hospital follow-up, follow-up of her atrial fibrillation and acute on chronic diastolic CHF  Main complaint is that she feels draggy, tired Worsening arthritis, back and knees Stopped NSAIDS when eliquis started Having more trouble Recently seen by sports medicine had  cortisone in the knee but reports back is a major issue Started on gabapentin makes her sleepy at night but does not seem to help the pain in the day  She is taking Lasix 40 daily with no potassium Mild lower extremity swelling, chronic Feels her breathing is stable  Bothered by the number of medications that she has  Recent testing discussed with her For shortness of breath, recent stress test, 07/2018 Showing no significant ischemia normal ejection fraction normal perfusion  Other history reviewed Presented to the hospital 04/2018 with worsening shortness of breath, leg swelling, weight gain, abdominal bloating and new atrial fibrillation with RVR Initially on Cardizem infusion, changed to oral Cardizem with titration upwards Discharged on Cardizem CD 300 mg and metoprolol 25 mg bid, Eliquis. Treated with IV Lasix during her hospital course with marked improvement in her shortness of breath   Prior CV studies:   The following studies were reviewed today:  Stress Myoview 08/10/2018 Pharmacological myocardial perfusion imaging study with no significant  ischemia Normal wall motion, EF estimated at 59% No EKG changes concerning for ischemia at peak stress or in recovery. Low risk scan  Carotid ultrasound January 2020 Less than 39% disease bilaterally  echocardiogram  EF 55-60 % Dilated atria bilaterally Home  Past Medical History:  Diagnosis Date  . Arthritis    knees, Hands  . GERD (gastroesophageal reflux disease)   . Persistent atrial fibrillation    a.  Diagnosed 12/19; b. CHADS2VASc => 6 (CHF, HTN, age x 2, vascular disease, female); c. Eliquis   Past Surgical History:  Procedure Laterality Date  . CARDIOVERSION  N/A 06/15/2018   Procedure: CARDIOVERSION (CATH LAB);  Surgeon: Minna Merritts, MD;  Location: [facility] ORS;  Service: Cardiovascular;  Laterality: N/A;  . CATARACT EXTRACTION W/PHACO Right 01/25/2016   Procedure: CATARACT EXTRACTION PHACO AND INTRAOCULAR  LENS PLACEMENT ([place]);  Surgeon: Ronnell Freshwater, MD;  Location: [place];  Service: Ophthalmology;  Laterality: Right;  RIGHT  . CATARACT EXTRACTION W/PHACO Left 02/22/2016   Procedure: CATARACT EXTRACTION PHACO AND INTRAOCULAR LENS PLACEMENT ([facility]);  Surgeon: Ronnell Freshwater, MD;  Location: [place];  Service: Ophthalmology;  Laterality: Left;  LEFT  . [place]  2010  . KNEE ARTHROSCOPY Right 2004  . REPLACEMENT TOTAL KNEE Right 2009   [facility]  . SKIN GRAFT Left 04/08/2013   Done on left index finger  . TONSILLECTOMY  1946     No outpatient medications have been marked as taking for the 08/28/18 encounter (Appointment) with Minna Merritts, MD.     Allergies:   Levofloxacin; Influenza vaccines; and Oysters [shellfish allergy]   Social History   Tobacco Use  . Smoking status: Former Smoker    Packs/day: 1.00    Years: 30.00    Pack years: 30.00    Types: Cigarettes    Last attempt to quit: 05/16/1989    Years since quitting: 29.3  . Smokeless tobacco: Never Used  Substance Use Topics  . Alcohol use: Yes    Alcohol/week: 14.0 standard drinks    Types: 7 Glasses of wine, 7 Standard drinks or equivalent per week  . Drug use: No     Current Outpatient Medications on File Prior to Visit  Medication Sig Dispense Refill  . apixaban (ELIQUIS) 5 MG TABS tablet Take 1 tablet (5 mg total) by mouth 2 (two) times daily. 180 tablet 3  . furosemide (LASIX) 40 MG tablet Take 1 tablet (40 mg) by mouth twice daily, may take an extra 40 mg once daily as needed for swelling    . gabapentin (NEURONTIN) 100 MG capsule Take 2 capsules (200 mg total) by mouth at bedtime. 60 capsule 3  . loperamide (IMODIUM) 2 MG capsule Take by mouth.    . losartan (COZAAR) 50 MG tablet Take 1 tablet (50 mg total) by mouth daily. 90 tablet 3  . metoprolol tartrate (LOPRESSOR) 25 MG tablet Take 1 tablet (25 mg total) by mouth 2 (two) times daily.  180 tablet 3  . Multiple Vitamin (MULTIVITAMIN) capsule Take 1 capsule by mouth daily.    . potassium chloride (K-DUR) 10 MEQ tablet Take 1 tablet (10 meq) by mouth twice daily    . VENTOLIN HFA 108 (90 Base) MCG/ACT inhaler TAKE 1 TO 2 PUFFS EVERY 6 HOURS AS NEEDED FOR SHORTNESS OF BREATH 18 g 3  . Vitamin D, Ergocalciferol, (DRISDOL) 1.25 MG (50000 UT) CAPS capsule Take 1 capsule (50,000 Units total) by mouth every 7 (seven) days. 12 capsule 0   No current facility-administered medications on file prior to visit.      Family Hx: [name] family history includes Atrial fibrillation in her sister and sister; Breast cancer (age of onset: 26) in her sister; Healthy in her brother; Heart attack in her father; Hyperlipidemia in her sister and sister; Transient ischemic attack in her mother.  ROS:   Please see the history of present illness.    Review of Systems  Constitutional: Negative.   Respiratory: Negative.   Cardiovascular: Negative.   Gastrointestinal: Negative.   Musculoskeletal: Positive for back  pain and joint pain.  Neurological: Negative.   Psychiatric/Behavioral: Negative.   All other systems reviewed and are negative.    Labs/Other Tests and Data Reviewed:    Recent Labs: 05/03/2018: B Natriuretic Peptide 376.0 05/04/2018: ALT 62; Magnesium 2.6; TSH 1.615 06/06/2018: Hemoglobin 14.0; Platelets 279 07/03/2018: BUN 15; Creatinine, Ser 0.81; Potassium 4.6; Sodium 141   Recent Lipid Panel Lab Results  Component Value Date/Time   CHOL 180 05/31/2018 04:52 AM   CHOL 269 (H) 12/28/2016 08:23 AM   TRIG 107 05/31/2018 04:52 AM   HDL 50 05/31/2018 04:52 AM   HDL 67 12/28/2016 08:23 AM   CHOLHDL 3.6 05/31/2018 04:52 AM   LDLCALC 109 (H) 05/31/2018 04:52 AM   LDLCALC 169 (H) 12/28/2016 08:23 AM    Wt Readings from Last 3 Encounters:  07/26/18 215 lb (97.5 kg)  07/03/18 214 lb 8 oz (97.3 kg)  06/27/18 213 lb 3.2 oz (96.7 kg)     Exam:    Vital Signs: Vital signs  may also be detailed in the HPI There were no vitals taken for this visit.  Wt Readings from Last 3 Encounters:  07/26/18 215 lb (97.5 kg)  07/03/18 214 lb 8 oz (97.3 kg)  06/27/18 213 lb 3.2 oz (96.7 kg)   Temp Readings from Last 3 Encounters:  06/27/18 97.8 F (36.6 C) (Oral)  06/15/18 98.6 F (37 C) (Oral)  06/07/18 97.7 F (36.5 C) (Oral)   BP Readings from Last 3 Encounters:  07/26/18 (!) 150/70  07/03/18 112/60  06/27/18 (!) 145/76   Pulse Readings from Last 3 Encounters:  07/26/18 68  07/03/18 64  06/27/18 (!) 48    Vitals  Blood pressure range 112 up to 785Y systolic over 85O heart rate 60s Blood pressure runs high in the doctor's offices 150s Respiration 16  Well nourished, well developed female in no acute distress. Constitutional:  oriented to person, place, and time. No distress.    ASSESSMENT & PLAN:    Paroxysmal atrial fibrillation ([place]) - Plan: EKG 12-Lead Stay on same meds Stay on Eliquis and low-dose metoprolol  Chronic diastolic CHF (congestive heart failure) (HCC) Exacerbated by arrhythmia Recommended Lasix 40 daily She does have high fluid intake She is not taking extra Lasix  Leg edema Recommended compression hose Symptoms are stable  Smoker Reports prior history of smoking Likely with underlying COPD  Centrilobular emphysema (HCC)  no recent exacerbations of bronchitis  Pulmonary hypertension, unspecified (HCC) Moderate pulmonary hypertension seen on prior echocardiogram We will continue Lasix 40 daily  Continue potassium  Back pain, knee pain Acute on chronic back and knee pain Major issue discussed on today's visit Reports prior history of spinal stenosis 2013 on MRI Had recent cortisone to the knee Still miserable We did discuss options including trying Celebrex Meloxicam also an option with a PPI Will refer to primary care Might need back MRI given worsening symptoms, if this shows significant pathology, may need  further procedures    COVID-19 Education: The signs and symptoms of COVID-19 were discussed with the patient and how to seek care for testing (follow up with PCP or arrange E-visit).  The importance of social distancing was discussed today.  Patient Risk:   After full review of this patients clinical status, I feel that they are at least moderate risk at this time.  Time:   Today, I have spent 25 minutes with the patient with telehealth technology discussing the cardiac and medical problems/diagnoses detailed above  Medication Adjustments/Labs and Tests Ordered: Current medicines are reviewed at length with the patient today.  Concerns regarding medicines are outlined above.   Tests Ordered: No tests ordered   Medication Changes: No changes made   Disposition: Follow-up in 6 months   Signed, Ida Rogue, MD  08/27/2018 3:17 PM    [place] Office [address] [place] [address], [place], [place] [address]

## 2018-08-27 NOTE — Telephone Encounter (Signed)
Spoke with patient.  Reviewed how tomorrows video visit would work.  Reviewed medications and allergies with patient.  Preformed a Doxy Trial - Video and Audio worked.

## 2018-08-28 ENCOUNTER — Other Ambulatory Visit: Payer: Self-pay

## 2018-08-28 ENCOUNTER — Telehealth (INDEPENDENT_AMBULATORY_CARE_PROVIDER_SITE_OTHER): Payer: Medicare HMO | Admitting: Cardiovascular Disease

## 2018-08-28 ENCOUNTER — Telehealth: Payer: Self-pay | Admitting: Family Medicine

## 2018-08-28 DIAGNOSIS — I10 Essential (primary) hypertension: Secondary | ICD-10-CM

## 2018-08-28 DIAGNOSIS — I480 Paroxysmal atrial fibrillation: Secondary | ICD-10-CM

## 2018-08-28 DIAGNOSIS — G8929 Other chronic pain: Secondary | ICD-10-CM

## 2018-08-28 DIAGNOSIS — I272 Pulmonary hypertension, unspecified: Secondary | ICD-10-CM

## 2018-08-28 DIAGNOSIS — J432 Centrilobular emphysema: Secondary | ICD-10-CM | POA: Diagnosis not present

## 2018-08-28 DIAGNOSIS — Z79899 Other long term (current) drug therapy: Secondary | ICD-10-CM | POA: Diagnosis not present

## 2018-08-28 DIAGNOSIS — R0600 Dyspnea, unspecified: Secondary | ICD-10-CM | POA: Insufficient documentation

## 2018-08-28 DIAGNOSIS — M25569 Pain in unspecified knee: Secondary | ICD-10-CM

## 2018-08-28 DIAGNOSIS — M549 Dorsalgia, unspecified: Secondary | ICD-10-CM

## 2018-08-28 DIAGNOSIS — Z7901 Long term (current) use of anticoagulants: Secondary | ICD-10-CM

## 2018-08-28 DIAGNOSIS — I4819 Other persistent atrial fibrillation: Secondary | ICD-10-CM

## 2018-08-28 DIAGNOSIS — R0609 Other forms of dyspnea: Secondary | ICD-10-CM | POA: Insufficient documentation

## 2018-08-28 DIAGNOSIS — Z87891 Personal history of nicotine dependence: Secondary | ICD-10-CM

## 2018-08-28 DIAGNOSIS — I5032 Chronic diastolic (congestive) heart failure: Secondary | ICD-10-CM

## 2018-08-28 MED ORDER — FUROSEMIDE 40 MG PO TABS: 40.0000 mg | ORAL_TABLET | Freq: Every day | ORAL | 3 refills | Status: DC

## 2018-08-28 MED ORDER — APIXABAN 5 MG PO TABS: 5.0000 mg | ORAL_TABLET | Freq: Two times a day (BID) | ORAL | 11 refills | Status: DC

## 2018-08-28 NOTE — Progress Notes (Signed)
Spoke with patient and reviewed her requirements for assistance. Income was $28,000 last year and only one person in the household. Based on this she would qualify once she spends 3% of her annual income for assistance through [employer]. She is going to reach out to pharmacy and also to her insurance to see if she has deductible and reports she will call me back today.

## 2018-08-28 NOTE — Telephone Encounter (Signed)
Pt wants to know if we have any samples of Eliquis 5 mg  Pt's call back [phone]  Thanks teri

## 2018-08-28 NOTE — Addendum Note (Signed)
Addended by: Valora Corporal on: 08/28/2018 11:59 AM   Modules accepted: Orders

## 2018-08-28 NOTE — Progress Notes (Signed)
Spoke with patient and she states that she did not qualify for assistance with Eliquis. Reviewed that I will place some samples up front for her to pick up and tha tI would reach out to her pharmacy to see what her cost would be if she is started on Xarelto. She verbalized understanding of our conversation, agreement with plan, and had no further questions at this time.  Medication Samples have been provided to the patient.  Drug name: Eliquis       Strength: 5 mg        Qty: 2 boxes  LOT: [id number]  Exp.Date: 10/2020   Called her pharmacy to see what her cost would be for Xarelto and that was going to be $245.42 per month. Instructed them to cancel order for Xarelto and let me check with patient and provider for further instructions.   Spoke with patient and reviewed the cost of Xarelto which she reports is also expensive. Advised that I would make provider aware and would place samples up front. She was appreciative for the call and follow up with no further questions at this time.

## 2018-08-28 NOTE — Progress Notes (Signed)
Does her insurance provide any co-pay to get it down to 30 or $40 on Eliquis or Xarelto Is this price only because of deductible? Only other option is warfarin if price does not come down Do we try Xarelto patient assistance?

## 2018-08-28 NOTE — Patient Instructions (Addendum)
Medication Instructions:  Updated medication list.  Furosemide (Lasix) 40 mg once daily  Medication Samples have been provided to the patient.  Drug name: Eliquis       Strength: 5 mg        Qty: 2 bottles  LOT: [id number]  Exp.Date: 6/22  Patients cost for Xarelto would be $245.42 a month which is also too expensive. Will make provider aware and will reach out to patient with any further recommendations.   If you need a refill on your cardiac medications before your next appointment, please call your pharmacy.   Lab work: No new labs needed   If you have labs (blood work) drawn today and your tests are completely normal, you will receive your results only by: [name] Kitchen MyChart Message (if you have MyChart) OR . A paper copy in the mail If you have any lab test that is abnormal or we need to change your treatment, we will call you to review the results.   Testing/Procedures: No new testing needed   Follow-Up: At [facility], you and your health needs are our priority.  As part of our continuing mission to provide you with exceptional heart care, we have created designated Provider Care Teams.  These Care Teams include your primary Cardiologist (physician) and Advanced Practice Providers (APPs -  Physician Assistants and Nurse Practitioners) who all work together to provide you with the care you need, when you need it.  . You will need a follow up appointment in 6 months .   Please call our office 2 months in advance to schedule this appointment.    . Providers on your designated Care Team:   . Murray Hodgkins, NP . Christell Faith, PA-C . Marrianne Mood, PA-C  Any Other Special Instructions Will Be Listed Below (If Applicable).  For educational health videos Log in to : [email or web address] Or : [email or web address], password : [email or web address]

## 2018-08-28 NOTE — Progress Notes (Signed)
Patient called back and states that it will cost $47.00 per month once she meets her deductible. She is going to meet that next month and will let us know if she has any further questions.

## 2018-08-28 NOTE — Addendum Note (Signed)
Addended by: Valora Corporal on: 08/28/2018 09:48 AM   Modules accepted: Orders

## 2018-08-29 NOTE — Telephone Encounter (Signed)
Samples left at the front desk for pick up. Patient advised.

## 2018-08-29 NOTE — Telephone Encounter (Signed)
Samples are ok. Noted Cardiology note about looking into alternatives and cost assistance. They are out of samples.

## 2018-09-03 ENCOUNTER — Ambulatory Visit: Payer: Self-pay | Admitting: Family Medicine

## 2018-09-30 IMAGING — CR DG CHEST 2V
1 series · 2 of 2 positions shown · non-contrast
Comparison: 11/14/2016

CLINICAL DATA: Cough and shortness of breath for 3 days, former
smoker

EXAM:
CHEST  2 VIEW

[Series 1: dg chest 2 view · 0.14mm/px · 2 of 2 slices shown]
[im 1/2]
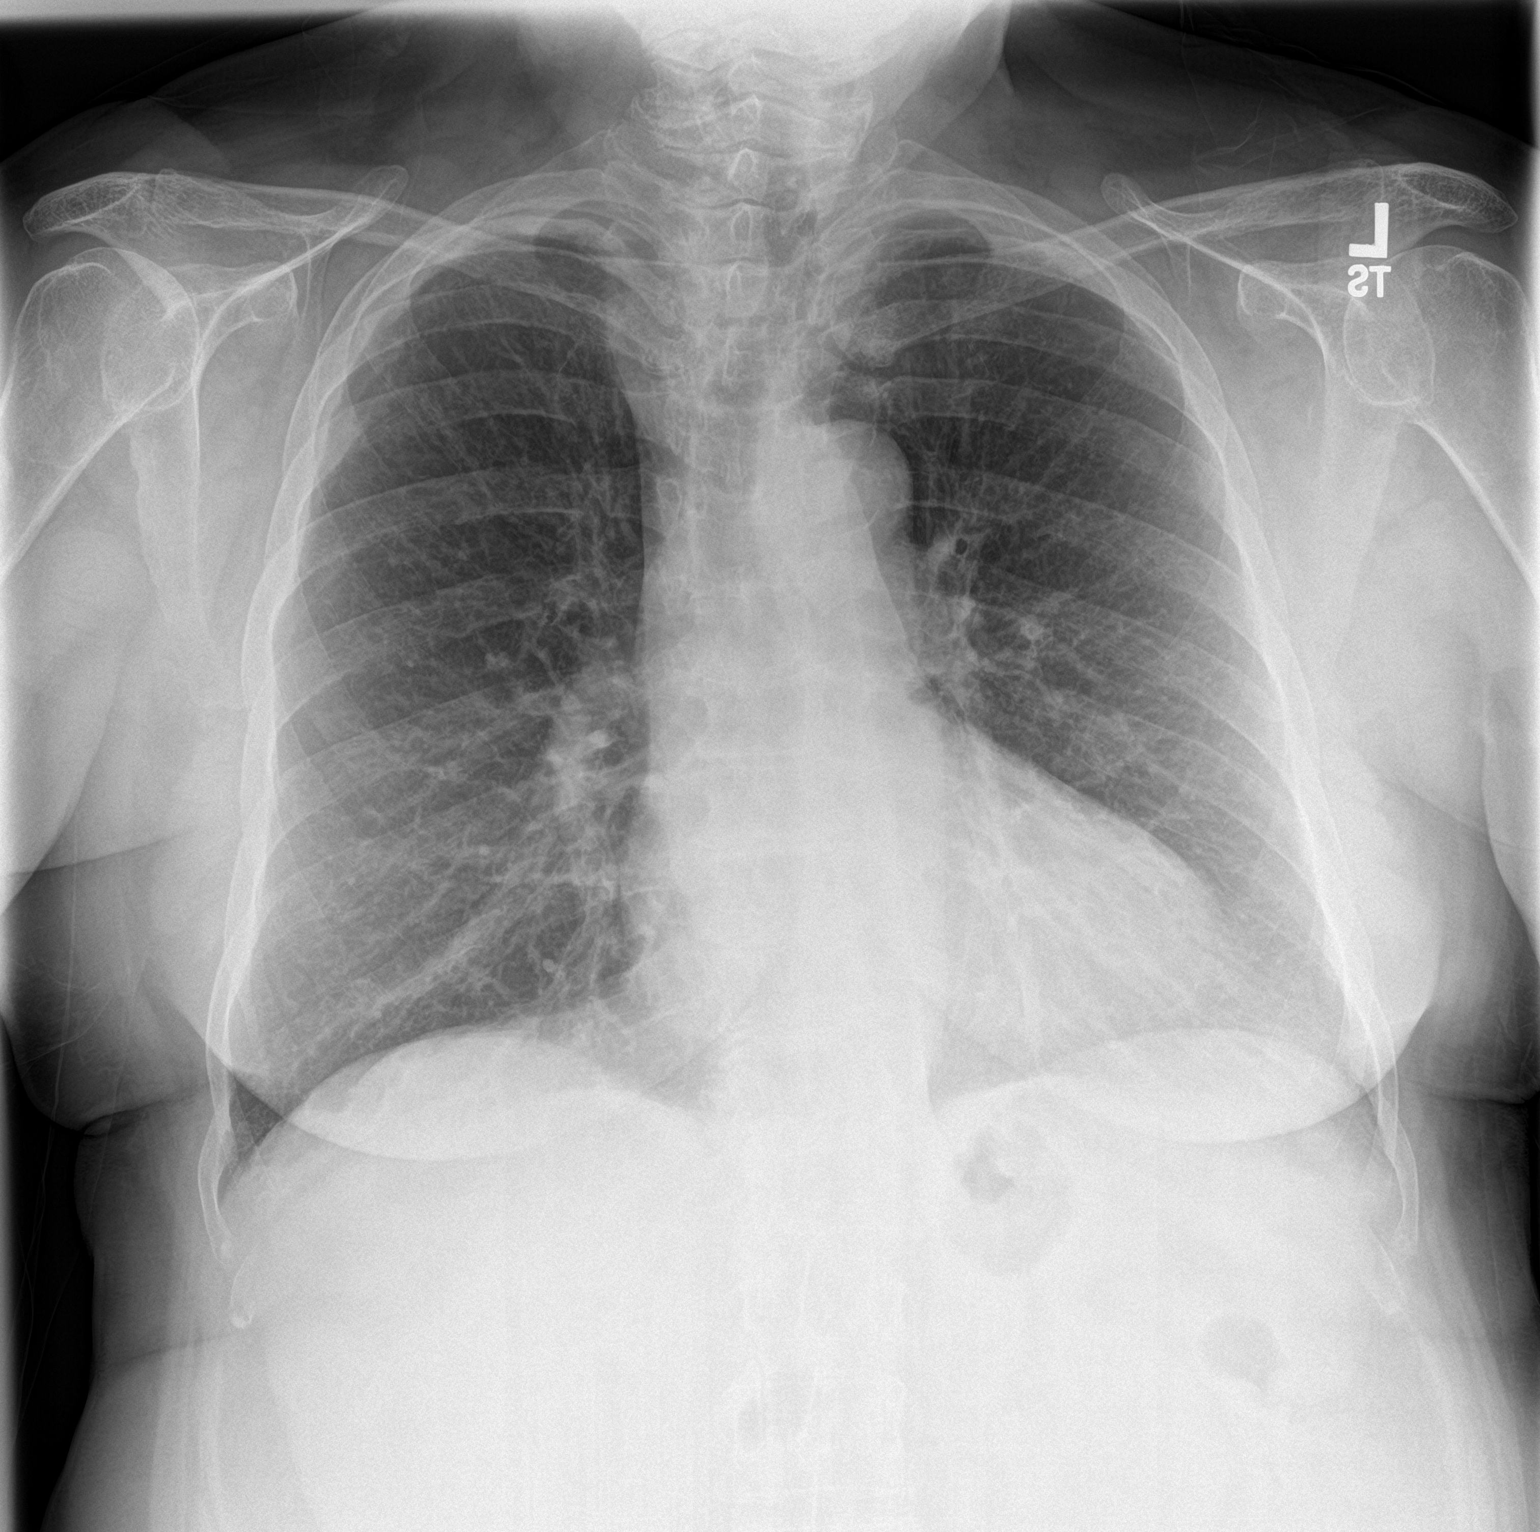
[im 2/2]
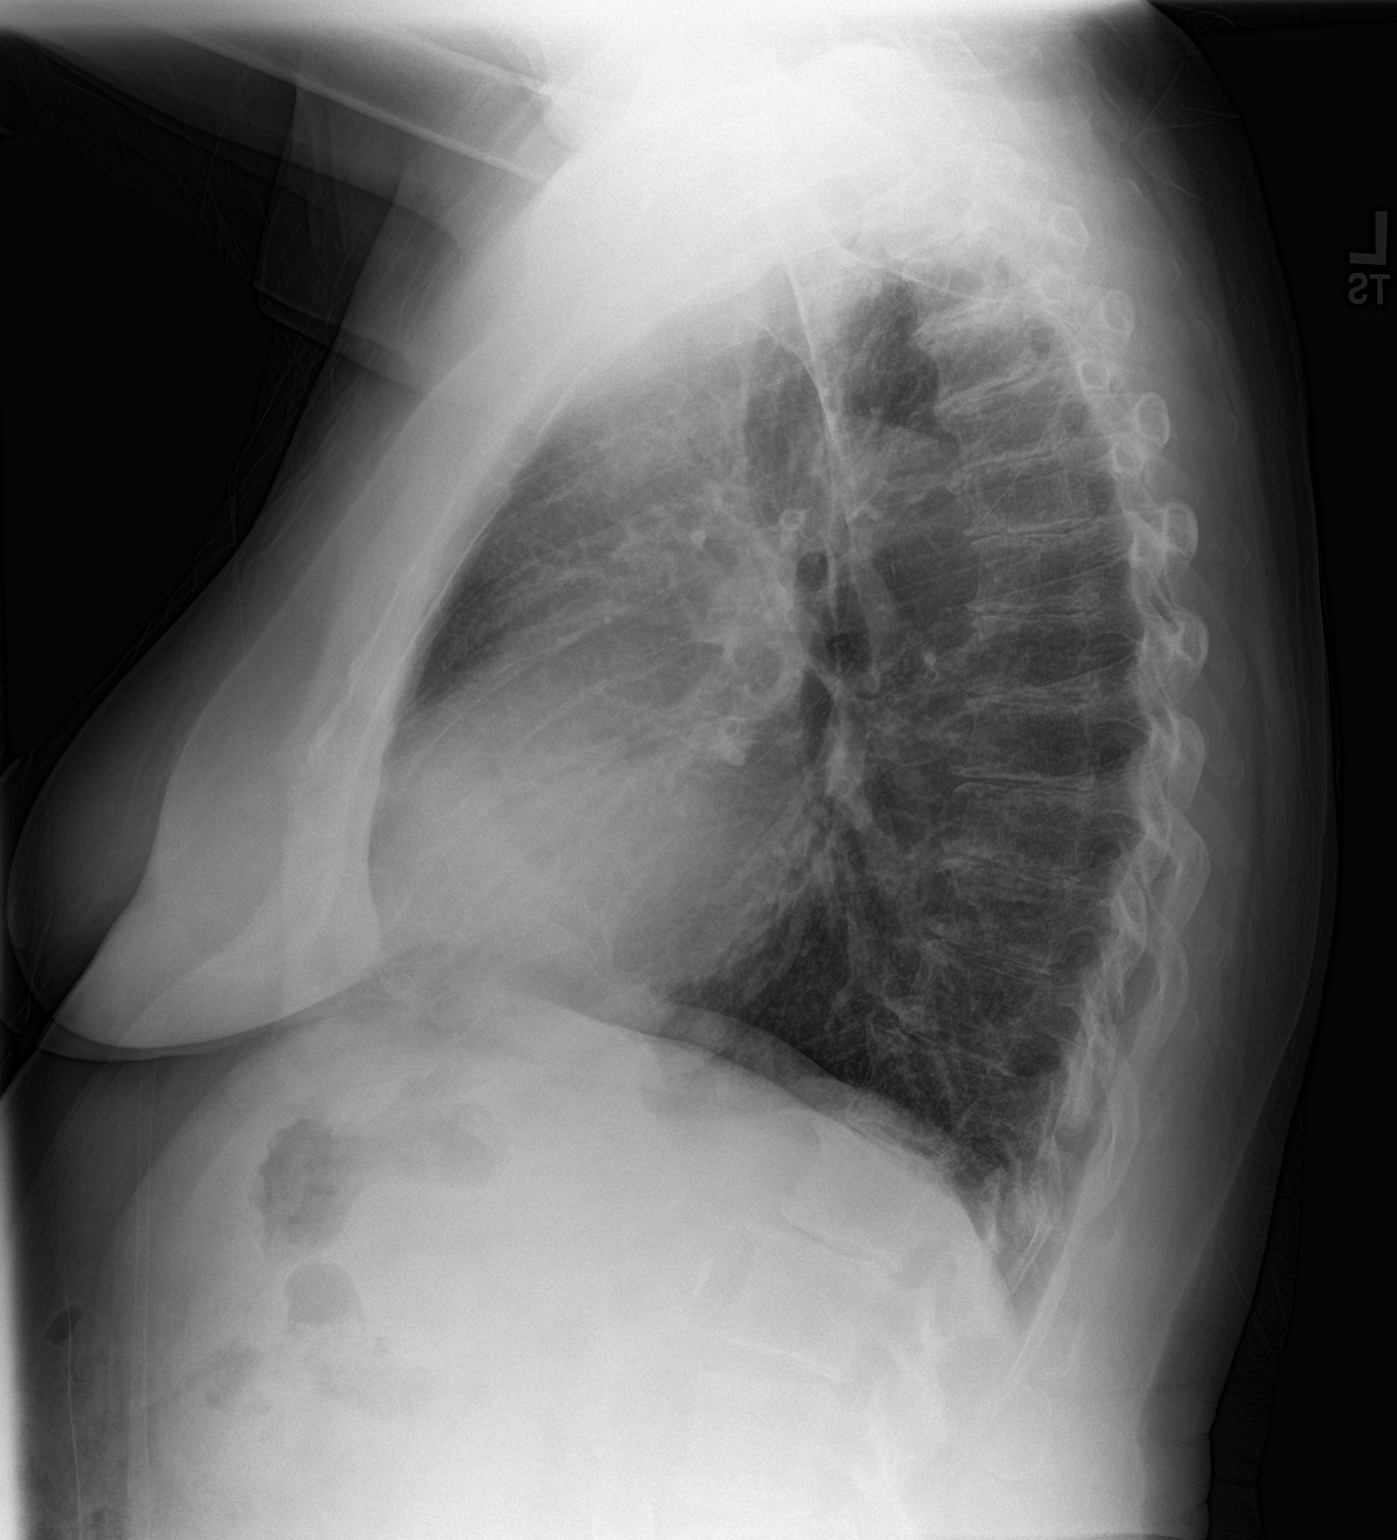

[2 of 2 positions shown; findings below may reference images not displayed]

FINDINGS: Minimal enlargement of cardiac silhouette.

Atherosclerotic calcification at aortic arch.

Mediastinal contours and pulmonary vascularity normal.

Chronic peribronchial thickening without infiltrate, pleural
effusion, or pneumothorax.

Bones demineralized.
IMPRESSION: Minimal enlargement of cardiac silhouette bronchitic changes without
acute abnormalities.

## 2018-10-04 ENCOUNTER — Ambulatory Visit: Payer: Self-pay | Admitting: Family Medicine

## 2018-11-01 ENCOUNTER — Telehealth: Payer: Self-pay | Admitting: Cardiovascular Disease

## 2018-11-01 NOTE — Telephone Encounter (Signed)
Spoke with patient and reviewed that when she saw provider at last visit he recommended refer to primary care for this discomfort. She will reach out to her and see if they can assist. She was appreciative for the call with no further questions at this time.

## 2018-11-01 NOTE — Telephone Encounter (Signed)
Patient wants referral from cards for back pain / muscle relaxer as previously discussed.   Patient advised to also call pcp

## 2018-11-01 NOTE — Progress Notes (Signed)
Patient: Lauren Lloyd Female    DOB: Apr 09, 1941   78 y.o.   MRN: [id number] Visit Date: 11/02/2018  Today's Provider: Mar Daring, PA-C   Chief Complaint  Patient presents with  . Back Pain   Subjective:    I,Joseline E. Rosas,RMA am acting as a Education administrator for [employer], PA-C.  HPI Patient here today with c/o back pain and requesting MRI. Reports that she has been hurting from her back before the CoronaVirus. Patient is taking Aleve just to get out of the bed in the morning but knows she shouldn't since she is on Eliquis. She denies any radiculopathy down her legs. Most of the pain is across the low back, but radiates up her back and around her stomach area. She reports increased muscle spasm, or feelings of the back catching and it takes her breath away. If she is lying or sitting she is ok, but when she stands or twists the pain is almost unbearable. Has also tried tylenol with no relief. Has been using heating pads as well.   She has seen Hulan Saas, DO with [facility] on 07/26/18. She had xrays done at that time that showed DDD lumbosacral, anterolishtesis on L4-L5 and retrolisthesis at T12-L1 and L1-L2. Also has known spinal stenosis in the cervical spine. Pain has been worsening over the last week. [place] and that is when it really escalated.    Allergies  Allergen Reactions  . Levofloxacin     Other reaction(s): Joint Pains Other reaction(s): Other (See Comments) Joint pain  . Influenza Vaccines Other (See Comments)    Bell's Palsy  . Oysters [Shellfish Allergy] Swelling    She states she had eaten them three days in a row and she developed swelling around her eyes.      Current Outpatient Medications:  .  apixaban (ELIQUIS) 5 MG TABS tablet, Take 1 tablet (5 mg total) by mouth 2 (two) times daily for 30 days., Disp: 60 tablet, Rfl: 11 .  furosemide (LASIX) 40 MG tablet, Take 1 tablet (40 mg total) by  mouth daily., Disp: 90 tablet, Rfl: 3 .  gabapentin (NEURONTIN) 100 MG capsule, Take 2 capsules (200 mg total) by mouth at bedtime., Disp: 60 capsule, Rfl: 3 .  loperamide (IMODIUM) 2 MG capsule, Take by mouth., Disp: , Rfl:  .  metoprolol tartrate (LOPRESSOR) 25 MG tablet, Take 1 tablet (25 mg total) by mouth 2 (two) times daily., Disp: 180 tablet, Rfl: 3 .  Multiple Vitamin (MULTIVITAMIN) capsule, Take 1 capsule by mouth daily., Disp: , Rfl:  .  Vitamin D, Ergocalciferol, (DRISDOL) 1.25 MG (50000 UT) CAPS capsule, Take 1 capsule (50,000 Units total) by mouth every 7 (seven) days., Disp: 12 capsule, Rfl: 0 .  losartan (COZAAR) 50 MG tablet, Take 1 tablet (50 mg total) by mouth daily., Disp: 90 tablet, Rfl: 3 .  potassium chloride (K-DUR) 10 MEQ tablet, Take 1 tablet (10 meq) by mouth twice daily, Disp: , Rfl:  .  VENTOLIN HFA 108 (90 Base) MCG/ACT inhaler, TAKE 1 TO 2 PUFFS EVERY 6 HOURS AS NEEDED FOR SHORTNESS OF BREATH (Patient not taking: Reported on 11/02/2018), Disp: 18 g, Rfl: 3  Review of Systems  Constitutional: Negative.   Respiratory: Negative.   Cardiovascular: Negative.   Gastrointestinal: Negative.   Musculoskeletal: Positive for back pain, gait problem and myalgias.  Neurological: Negative for weakness and numbness.    Social  History   Tobacco Use  . Smoking status: Former Smoker    Packs/day: 1.00    Years: 30.00    Pack years: 30.00    Types: Cigarettes    Quit date: 05/16/1989    Years since quitting: 29.4  . Smokeless tobacco: Never Used  Substance Use Topics  . Alcohol use: Yes    Alcohol/week: 14.0 standard drinks    Types: 7 Glasses of wine, 7 Standard drinks or equivalent per week      Objective:   BP 139/71 (BP Location: Left Arm, Patient Position: Sitting, Cuff Size: Large)   Pulse (!) 55   Temp 98.3 F (36.8 C) (Oral)   Resp 16   Wt 219 lb (99.3 kg)   BMI 33.79 kg/m  Vitals:   11/02/18 1403  BP: 139/71  Pulse: (!) 55  Resp: 16  Temp: 98.3 F  (36.8 C)  TempSrc: Oral  Weight: 219 lb (99.3 kg)     Physical Exam Vitals signs reviewed.  Constitutional:      General: She is not in acute distress.    Appearance: Normal appearance. She is well-developed. She is obese. She is not ill-appearing or diaphoretic.  Neck:     Musculoskeletal: Normal range of motion and neck supple.     Thyroid: No thyromegaly.     Vascular: No JVD.     Trachea: No tracheal deviation.  Cardiovascular:     Rate and Rhythm: Normal rate and regular rhythm.     Pulses: Normal pulses.     Heart sounds: Normal heart sounds. No murmur. No friction rub. No gallop.   Pulmonary:     Effort: Pulmonary effort is normal. No respiratory distress.     Breath sounds: Normal breath sounds. No wheezing or rales.  Musculoskeletal:     Lumbar back: She exhibits decreased range of motion, tenderness and spasm. She exhibits no bony tenderness.  Lymphadenopathy:     Cervical: No cervical adenopathy.  Neurological:     Mental Status: She is alert.   CLINICAL DATA:  Low back pain.  No known injury.  EXAM: LUMBAR SPINE - COMPLETE 4+ VIEW  COMPARISON:  03/06/2018.  FINDINGS: Lumbar spine numbered with the lowest segmented appearing lumbar shaped vertebral body on lateral view as L5. Mild S-shaped thoracolumbar spine scoliosis. Diffuse osteopenia. Diffuse multilevel degenerative change. 3 mm retrolisthesis T12 on L1. 2 mm retrolisthesis L1 on L2. 3 mm anterolisthesis L4 on L5. Aortoiliac atherosclerotic vascular calcification.  IMPRESSION: 1. Mild S-shaped thoracolumbar spine scoliosis. Diffuse osteopenia. Diffuse multilevel degenerative change. 3 mm retrolisthesis T12 on L1. 2 mm retrolisthesis L1 on L2. 3 mm anterolisthesis L4 on L5.  2.  Aortoiliac atherosclerotic vascular disease.   Electronically Signed   By: Marcello Moores  Register   On: 07/27/2018 05:55      Assessment & Plan    1. L-S radiculopathy Acute on chronic low back pain. Worsening  over the last week without relief. Has been trying conservative management measures over the last 3 months without relief and only progression of symptoms. Due to patient's acuity at this time will give Norco x 5 days for pain control. Also will use Baclofen for muscle spasms. May continue moist heat. Back exercises printed for patient. MRI ordered as below to determine true extent of back issues and whether she would benefit from neurosurgery referral or orthopedic spine specialists. She agrees. She is to call if worsening.  - MR Lumbar Spine Wo Contrast; Future - HYDROcodone-acetaminophen (NORCO/VICODIN) 5-325 MG  tablet; Take 1 tablet by mouth every 6 (six) hours as needed for moderate pain.  Dispense: 30 tablet; Refill: 0  2. DDD (degenerative disc disease), lumbosacral See above medical treatment plan. - MR Lumbar Spine Wo Contrast; Future - HYDROcodone-acetaminophen (NORCO/VICODIN) 5-325 MG tablet; Take 1 tablet by mouth every 6 (six) hours as needed for moderate pain.  Dispense: 30 tablet; Refill: 0  3. Spinal stenosis, unspecified spinal region See above medical treatment plan. - MR Lumbar Spine Wo Contrast; Future - HYDROcodone-acetaminophen (NORCO/VICODIN) 5-325 MG tablet; Take 1 tablet by mouth every 6 (six) hours as needed for moderate pain.  Dispense: 30 tablet; Refill: 0  4. Muscle spasm See above medical treatment plan. - MR Lumbar Spine Wo Contrast; Future - baclofen (LIORESAL) 10 MG tablet; Take 1 tablet (10 mg total) by mouth 3 (three) times daily.  Dispense: 30 each; Refill: 0 - HYDROcodone-acetaminophen (NORCO/VICODIN) 5-325 MG tablet; Take 1 tablet by mouth every 6 (six) hours as needed for moderate pain.  Dispense: 30 tablet; Refill: 0  5. Anterolisthesis At L4-L5. - MR Lumbar Spine Wo Contrast; Future - HYDROcodone-acetaminophen (NORCO/VICODIN) 5-325 MG tablet; Take 1 tablet by mouth every 6 (six) hours as needed for moderate pain.  Dispense: 30 tablet; Refill: 0  6.  Retrolisthesis At T12-L1 and L1-L2. - MR Lumbar Spine Wo Contrast; Future - HYDROcodone-acetaminophen (NORCO/VICODIN) 5-325 MG tablet; Take 1 tablet by mouth every 6 (six) hours as needed for moderate pain.  Dispense: 30 tablet; Refill: 0     Mar Daring, PA-C  [facility]

## 2018-11-02 ENCOUNTER — Ambulatory Visit (INDEPENDENT_AMBULATORY_CARE_PROVIDER_SITE_OTHER): Payer: Medicare HMO | Admitting: Physician Assistant

## 2018-11-02 ENCOUNTER — Encounter: Payer: Self-pay | Admitting: Physician Assistant

## 2018-11-02 ENCOUNTER — Other Ambulatory Visit: Payer: Self-pay

## 2018-11-02 VITALS — BP 139/71 | HR 55 | Temp 98.3°F | Resp 16 | Wt 219.0 lb

## 2018-11-02 DIAGNOSIS — M4800 Spinal stenosis, site unspecified: Secondary | ICD-10-CM | POA: Diagnosis not present

## 2018-11-02 DIAGNOSIS — M4310 Spondylolisthesis, site unspecified: Secondary | ICD-10-CM

## 2018-11-02 DIAGNOSIS — M5137 Other intervertebral disc degeneration, lumbosacral region: Secondary | ICD-10-CM

## 2018-11-02 DIAGNOSIS — M62838 Other muscle spasm: Secondary | ICD-10-CM | POA: Diagnosis not present

## 2018-11-02 DIAGNOSIS — M5417 Radiculopathy, lumbosacral region: Secondary | ICD-10-CM | POA: Diagnosis not present

## 2018-11-02 MED ORDER — HYDROCODONE-ACETAMINOPHEN 5-325 MG PO TABS: 1.0000 | ORAL_TABLET | Freq: Four times a day (QID) | ORAL | 0 refills | Status: DC | PRN

## 2018-11-02 MED ORDER — BACLOFEN 10 MG PO TABS: 10.0000 mg | ORAL_TABLET | Freq: Three times a day (TID) | ORAL | 0 refills | Status: DC

## 2018-11-02 NOTE — Patient Instructions (Signed)

## 2018-11-05 ENCOUNTER — Emergency Department
Admission: EM | Admit: 2018-11-05 | Discharge: 2018-11-05 | Disposition: A | Discharge status: Home / Self Care | Payer: Medicare HMO | Attending: Emergency Medicine | Admitting: Emergency Medicine

## 2018-11-05 ENCOUNTER — Telehealth: Payer: Self-pay | Admitting: Physician Assistant

## 2018-11-05 ENCOUNTER — Emergency Department: Payer: Medicare HMO

## 2018-11-05 ENCOUNTER — Other Ambulatory Visit: Payer: Self-pay

## 2018-11-05 DIAGNOSIS — Z87891 Personal history of nicotine dependence: Secondary | ICD-10-CM | POA: Insufficient documentation

## 2018-11-05 DIAGNOSIS — M5134 Other intervertebral disc degeneration, thoracic region: Secondary | ICD-10-CM | POA: Diagnosis not present

## 2018-11-05 DIAGNOSIS — M48061 Spinal stenosis, lumbar region without neurogenic claudication: Secondary | ICD-10-CM | POA: Diagnosis not present

## 2018-11-05 DIAGNOSIS — M5126 Other intervertebral disc displacement, lumbar region: Secondary | ICD-10-CM | POA: Diagnosis not present

## 2018-11-05 DIAGNOSIS — Z79899 Other long term (current) drug therapy: Secondary | ICD-10-CM | POA: Diagnosis not present

## 2018-11-05 DIAGNOSIS — M549 Dorsalgia, unspecified: Secondary | ICD-10-CM | POA: Diagnosis not present

## 2018-11-05 DIAGNOSIS — M546 Pain in thoracic spine: Secondary | ICD-10-CM | POA: Insufficient documentation

## 2018-11-05 DIAGNOSIS — M5136 Other intervertebral disc degeneration, lumbar region: Secondary | ICD-10-CM | POA: Diagnosis not present

## 2018-11-05 DIAGNOSIS — Z96651 Presence of right artificial knee joint: Secondary | ICD-10-CM | POA: Diagnosis not present

## 2018-11-05 DIAGNOSIS — M47815 Spondylosis without myelopathy or radiculopathy, thoracolumbar region: Secondary | ICD-10-CM | POA: Diagnosis not present

## 2018-11-05 DIAGNOSIS — K8020 Calculus of gallbladder without cholecystitis without obstruction: Secondary | ICD-10-CM | POA: Diagnosis not present

## 2018-11-05 DIAGNOSIS — I110 Hypertensive heart disease with heart failure: Secondary | ICD-10-CM | POA: Diagnosis not present

## 2018-11-05 DIAGNOSIS — M47817 Spondylosis without myelopathy or radiculopathy, lumbosacral region: Secondary | ICD-10-CM | POA: Diagnosis not present

## 2018-11-05 DIAGNOSIS — E042 Nontoxic multinodular goiter: Secondary | ICD-10-CM | POA: Diagnosis not present

## 2018-11-05 DIAGNOSIS — I5032 Chronic diastolic (congestive) heart failure: Secondary | ICD-10-CM | POA: Insufficient documentation

## 2018-11-05 LAB — CBC WITH DIFFERENTIAL/PLATELET
Abs Immature Granulocytes: 0.05 10*3/uL (ref 0.00–0.07)
Basophils Absolute: 0.1 10*3/uL (ref 0.0–0.1)
Basophils Relative: 1 %
Eosinophils Absolute: 0.1 10*3/uL (ref 0.0–0.5)
Eosinophils Relative: 1 %
HCT: 36.9 % (ref 36.0–46.0)
Hemoglobin: 12.5 g/dL (ref 12.0–15.0)
Immature Granulocytes: 1 %
Lymphocytes Relative: 19 %
Lymphs Abs: 1.8 10*3/uL (ref 0.7–4.0)
MCH: 31.3 pg (ref 26.0–34.0)
MCHC: 33.9 g/dL (ref 30.0–36.0)
MCV: 92.5 fL (ref 80.0–100.0)
Monocytes Absolute: 0.9 10*3/uL (ref 0.1–1.0)
Monocytes Relative: 9 %
Neutro Abs: 6.5 10*3/uL (ref 1.7–7.7)
Neutrophils Relative %: 69 %
Platelets: 249 10*3/uL (ref 150–400)
RBC: 3.99 MIL/uL (ref 3.87–5.11)
RDW: 12.3 % (ref 11.5–15.5)
WBC: 9.4 10*3/uL (ref 4.0–10.5)
nRBC: 0 % (ref 0.0–0.2)

## 2018-11-05 LAB — URINALYSIS, COMPLETE (UACMP) WITH MICROSCOPIC
Bacteria, UA: NONE SEEN
Bilirubin Urine: NEGATIVE
Glucose, UA: NEGATIVE mg/dL
Hgb urine dipstick: NEGATIVE
Ketones, ur: NEGATIVE mg/dL
Leukocytes,Ua: NEGATIVE
Nitrite: NEGATIVE
Protein, ur: NEGATIVE mg/dL
Specific Gravity, Urine: 1.041 — ABNORMAL HIGH (ref 1.005–1.030)
Squamous Epithelial / HPF: NONE SEEN (ref 0–5)
pH: 5 (ref 5.0–8.0)

## 2018-11-05 LAB — COMPREHENSIVE METABOLIC PANEL
ALT: 17 U/L (ref 0–44)
AST: 20 U/L (ref 15–41)
Albumin: 3.6 g/dL (ref 3.5–5.0)
Alkaline Phosphatase: 88 U/L (ref 38–126)
Anion gap: 10 (ref 5–15)
BUN: 21 mg/dL (ref 8–23)
CO2: 25 mmol/L (ref 22–32)
Calcium: 9 mg/dL (ref 8.9–10.3)
Chloride: 107 mmol/L (ref 98–111)
Creatinine, Ser: 0.68 mg/dL (ref 0.44–1.00)
GFR calc Af Amer: >60 mL/min (ref >60)
GFR calc non Af Amer: >60 mL/min (ref >60)
Glucose, Bld: 104 mg/dL — ABNORMAL HIGH (ref 70–99)
Potassium: 3.6 mmol/L (ref 3.5–5.1)
Sodium: 142 mmol/L (ref 135–145)
Total Bilirubin: 0.6 mg/dL (ref 0.3–1.2)
Total Protein: 6.7 g/dL (ref 6.5–8.1)

## 2018-11-05 LAB — TROPONIN I: Troponin I: <0.03 ng/mL (ref <0.03)

## 2018-11-05 MED ORDER — OXYCODONE-ACETAMINOPHEN 5-325 MG PO TABS
1.0000 | ORAL_TABLET | Freq: Once | ORAL | Status: AC
  Administered 2018-11-05: 1 via ORAL
  Filled 2018-11-05: qty 1

## 2018-11-05 MED ORDER — IOPAMIDOL (ISOVUE-370) INJECTION 76%
100.0000 mL | Freq: Once | INTRAVENOUS | Status: AC | PRN
  Administered 2018-11-05: 100 mL via INTRAVENOUS
  Filled 2018-11-05: qty 100

## 2018-11-05 MED ORDER — MORPHINE SULFATE (PF) 4 MG/ML IV SOLN
4.0000 mg | Freq: Once | INTRAVENOUS | Status: AC
  Administered 2018-11-05: 4 mg via INTRAVENOUS
  Filled 2018-11-05: qty 1

## 2018-11-05 MED ORDER — ONDANSETRON HCL 4 MG/2ML IJ SOLN
4.0000 mg | Freq: Once | INTRAMUSCULAR | Status: AC
  Administered 2018-11-05: 4 mg via INTRAVENOUS
  Filled 2018-11-05: qty 2

## 2018-11-05 MED ORDER — OXYCODONE-ACETAMINOPHEN 5-325 MG PO TABS: 1.0000 | ORAL_TABLET | ORAL | 0 refills | Status: DC | PRN

## 2018-11-05 MED ORDER — METHYLPREDNISOLONE 4 MG PO TBPK: ORAL_TABLET | ORAL | 0 refills | Status: DC

## 2018-11-05 MED ORDER — METAXALONE 800 MG PO TABS: 800.0000 mg | ORAL_TABLET | Freq: Three times a day (TID) | ORAL | 1 refills | Status: AC

## 2018-11-05 NOTE — Telephone Encounter (Signed)
Patient called Saturday and talked to a nurse regarding this, was told by nurse to go to ER but patient did not.   She is in excrutiating pain from her arm pits to her waist in her back, cannot stand for very long or move unless she is on heating pad, is becoming incontinent.  She is wanting to know if she can get MRI today and if she can double her pain medication. Said she has never been in so much pain in her entire life.

## 2018-11-05 NOTE — Discharge Instructions (Addendum)
Follow-up with [facility] neurosurgery.  Please call for an appointment.  If you prefer to use someone in [place] please have your regular doctor refer you there.  Do not take the baclofen.  Use the Skelaxin along with a steroid pack.  Start steroid pack tomorrow.  Percocet for pain as needed.  I would advise a stool softener if you feel you are getting constipated.  Return to the emergency department if you are worsening.

## 2018-11-05 NOTE — Telephone Encounter (Signed)
Patient reports she is waiting on her ride to go the ER.

## 2018-11-05 NOTE — Telephone Encounter (Signed)
She would have to call radiology about MRI appointment. I cannot rush that. She would have to go to ER for urgent imaging, which she should probably do, because it sounds like cauda equina syndrome, which can require urgent surgery to prevent her from having permanent nerve damage.

## 2018-11-05 NOTE — ED Notes (Addendum)
See triage note  Presents with cont'd back pain  States she was riding in a car about 10 days ago. Then developed pain to her lower back   Now pain is moving into shoulder

## 2018-11-05 NOTE — ED Provider Notes (Signed)
[facility] Emergency Department Provider Note  ____________________________________________   First MD Initiated Contact with Patient 11/05/18 1236     (approximate)  I have reviewed the triage vital signs and the nursing notes.   HISTORY  Chief Complaint Back Pain    HPI Lauren Lloyd is a 78 y.o. female presents emergency department complaining of upper back pain.  Patient states she was riding in a car and felt something shift in it produced a lot of pain.  She is continued to have pain.  She called her PCP and was seen by the PA who gave her pain medication and muscle relaxers which have not helped at all.  She states the pain is worse with movement but tends to hurt constantly.  Patient has history of hypertension and A. fib.    Past Medical History:  Diagnosis Date  . Arthritis    knees, Hands  . GERD (gastroesophageal reflux disease)   . Persistent atrial fibrillation    a.  Diagnosed 12/19; b. CHADS2VASc => 6 (CHF, HTN, age x 2, vascular disease, female); c. Eliquis    Patient Active Problem List   Diagnosis Date Noted  . Dyspnea on exertion 08/28/2018  . Degenerative cervical spinal stenosis 07/26/2018  . Degenerative arthritis of left knee 07/26/2018  . Persistent atrial fibrillation 06/06/2018  . Paresthesias 05/30/2018  . Chronic diastolic CHF (congestive heart failure) ([place]) 05/11/2018  . Centrilobular emphysema ([place]) 05/11/2018  . Smoker 05/11/2018  . Leg edema 05/11/2018  . A-fib ([place]) 05/03/2018  . Hematuria 04/04/2017  . Leg cramps 04/04/2017  . Metatarsalgia of both feet 04/04/2017  . 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 Hypertension 01/03/2017  . Hemorrhoids 01/03/2017  . Healthcare maintenance 01/03/2017  . Chronic right shoulder pain 01/03/2017  . Pulmonary hypertension, unspecified ([place]) 04/02/2015  . Chest pain radiating to arm 02/17/2015  . Acid reflux 11/18/2014  . IBS (irritable bowel syndrome)  11/18/2014  . Primary osteoarthritis of one hip 10/03/2011  . L-S radiculopathy 09/29/2011  . Arthritis of knee, degenerative 09/29/2011  . S/P knee replacement 09/29/2011  . Non-toxic uninodular goiter 06/20/2009  . Cervical pain 08/20/2008  . Cannot sleep 12/12/2007  . Hypercholesteremia 07/30/2007    Past Surgical History:  Procedure Laterality Date  . CARDIOVERSION N/A 06/15/2018   Procedure: CARDIOVERSION (CATH LAB);  Surgeon: Minna Merritts, MD;  Location: [facility] ORS;  Service: Cardiovascular;  Laterality: N/A;  . CATARACT EXTRACTION W/PHACO Right 01/25/2016   Procedure: CATARACT EXTRACTION PHACO AND INTRAOCULAR LENS PLACEMENT ([place]);  Surgeon: Ronnell Freshwater, MD;  Location: [place];  Service: Ophthalmology;  Laterality: Right;  RIGHT  . CATARACT EXTRACTION W/PHACO Left 02/22/2016   Procedure: CATARACT EXTRACTION PHACO AND INTRAOCULAR LENS PLACEMENT ([facility]);  Surgeon: Ronnell Freshwater, MD;  Location: [place];  Service: Ophthalmology;  Laterality: Left;  LEFT  . [place]  2010  . KNEE ARTHROSCOPY Right 2004  . REPLACEMENT TOTAL KNEE Right 2009   [facility]  . SKIN GRAFT Left 04/08/2013   Done on left index finger  . TONSILLECTOMY  1946    Prior to Admission medications   Medication Sig Start Date End Date Taking? Authorizing Provider  apixaban (ELIQUIS) 5 MG TABS tablet Take 1 tablet (5 mg total) by mouth 2 (two) times daily for 30 days. 08/28/18 11/02/18  Minna Merritts, MD  baclofen (LIORESAL) 10 MG tablet Take 1 tablet (10 mg total) by mouth 3 (three) times  daily. 11/02/18   Mar Daring, PA-C  furosemide (LASIX) 40 MG tablet Take 1 tablet (40 mg total) by mouth daily. 08/28/18   Minna Merritts, MD  gabapentin (NEURONTIN) 100 MG capsule Take 2 capsules (200 mg total) by mouth at bedtime. 07/26/18   Lyndal Pulley, DO  HYDROcodone-acetaminophen (NORCO/VICODIN) 5-325 MG tablet Take 1 tablet by mouth  every 6 (six) hours as needed for moderate pain. 11/02/18   Mar Daring, PA-C  loperamide (IMODIUM) 2 MG capsule Take by mouth.    [provider]  losartan (COZAAR) 50 MG tablet Take 1 tablet (50 mg total) by mouth daily. 06/25/18 09/23/18  Minna Merritts, MD  metaxalone (SKELAXIN) 800 MG tablet Take 1 tablet (800 mg total) by mouth 3 (three) times daily. 11/05/18 11/05/19  Shaymus Eveleth, Linden Dolin, PA-C  methylPREDNISolone (MEDROL DOSEPAK) 4 MG TBPK tablet Take 6 pills on day one then decrease by 1 pill each day 11/05/18   Versie Starks, PA-C  metoprolol tartrate (LOPRESSOR) 25 MG tablet Take 1 tablet (25 mg total) by mouth 2 (two) times daily. 07/03/18 11/02/18  Rise Mu, PA-C  Multiple Vitamin (MULTIVITAMIN) capsule Take 1 capsule by mouth daily.    [provider]  oxyCODONE-acetaminophen (PERCOCET) 5-325 MG tablet Take 1 tablet by mouth every 4 (four) hours as needed for severe pain. 11/05/18 11/05/19  Jamala Kohen, Linden Dolin, PA-C  potassium chloride (K-DUR) 10 MEQ tablet Take 1 tablet (10 meq) by mouth twice daily    [provider]  Vitamin D, Ergocalciferol, (DRISDOL) 1.25 MG (50000 UT) CAPS capsule Take 1 capsule (50,000 Units total) by mouth every 7 (seven) days. 07/26/18   Lyndal Pulley, DO    Allergies Levofloxacin, Influenza vaccines, and Oysters [shellfish allergy]  Family History  Problem Relation Age of Onset  . Hyperlipidemia Sister   . Atrial fibrillation Sister   . Transient ischemic attack Mother   . Heart attack Father   . Healthy Brother   . Breast cancer Sister 29  . Hyperlipidemia Sister   . Atrial fibrillation Sister     Social History Social History   Tobacco Use  . Smoking status: Former Smoker    Packs/day: 1.00    Years: 30.00    Pack years: 30.00    Types: Cigarettes    Quit date: 05/16/1989    Years since quitting: 29.4  . Smokeless tobacco: Never Used  Substance Use Topics  . Alcohol use: Yes    Alcohol/week: 14.0 standard  drinks    Types: 7 Glasses of wine, 7 Standard drinks or equivalent per week  . Drug use: No    Review of Systems  Constitutional: No fever/chills Eyes: No visual changes. ENT: No sore throat. Respiratory: Denies cough, denies shortness of breath Cardiovascular: States she has had some chest discomfort but feels like it is from movement Genitourinary: Negative for dysuria. Musculoskeletal: Positive for upper back pain. Skin: Negative for rash.    ____________________________________________   PHYSICAL EXAM:  VITAL SIGNS: ED Triage Vitals  Enc Vitals Group     BP 11/05/18 1230 (!) 155/80     Pulse Rate 11/05/18 1230 76     Resp 11/05/18 1230 17     Temp 11/05/18 1230 99.1 F (37.3 C)     Temp Source 11/05/18 1230 Oral     SpO2 11/05/18 1230 96 %     Weight 11/05/18 1231 218 lb (98.9 kg)     Height 11/05/18 1231 5\' 8"  (  1.727 m)     Head Circumference --      Peak Flow --      Pain Score 11/05/18 1231 5     Pain Loc --      Pain Edu? --      Excl. in [place]? --     Constitutional: Alert and oriented. Well appearing and in no acute distress. Eyes: Conjunctivae are normal.  Head: Atraumatic. Nose: No congestion/rhinnorhea. Mouth/Throat: Mucous membranes are moist.   Neck:  supple no lymphadenopathy noted Cardiovascular: Normal rate, regular rhythm. Heart sounds are normal Respiratory: Normal respiratory effort.  No retractions, lungs c t a  Abd: soft nontender bs normal all 4 quad, no pulsatile masses noted GU: deferred Musculoskeletal: FROM all extremities, warm and well perfused, C-spine and T-spine and lumbar spine are nontender, no muscle spasms are noted in the upper back or lower back. Neurologic:  Normal speech and language.  Skin:  Skin is warm, dry and intact. No rash noted. Psychiatric: Mood and affect are normal. Speech and behavior are normal.  ____________________________________________   LABS (all labs ordered are listed, but only abnormal results  are displayed)  Labs Reviewed  COMPREHENSIVE METABOLIC PANEL - Abnormal; Notable for the following components:      Result Value   Glucose, Bld 104 (*)    All other components within normal limits  URINALYSIS, COMPLETE (UACMP) WITH MICROSCOPIC - Abnormal; Notable for the following components:   Color, Urine YELLOW (*)    APPearance CLEAR (*)    Specific Gravity, Urine 1.041 (*)    All other components within normal limits  TROPONIN I  CBC WITH DIFFERENTIAL/PLATELET   ____________________________________________   ____________________________________________  RADIOLOGY  CTA for aortic dissection, no charge T-spine and L-spine, EKG CTA for aortic dissection is negative CT no charge T-spine and L-spine showed multiple degenerative disc and facet arthropathy ____________________________________________   PROCEDURES  Procedure(s) performed: EKG shows normal sinus rhythm   Procedures    ____________________________________________   INITIAL IMPRESSION / ASSESSMENT AND PLAN / ED COURSE  Pertinent labs & imaging results that were available during my care of the patient were reviewed by me and considered in my medical decision making (see chart for details).   Patient 78 year old female presents emergency department with concerns of upper back pain.  States she her doctor gave her pain medication and a muscle relaxer which is not helped.  She has had a little bit of chest pain but nothing specific.  Pain is increased with movement.  History of hypertension and A. Fib  Physical exam the spine and musculature not tender.  Patient does have reproduced pain with movement.  Due to the patient's age, history of hypertension, and A. fib CTA for aortic dissection ordered along with no charge T and L-spine.  EKG shows normal sinus rhythm   CBC is normal, comprehensive metabolic panel is normal, troponins negative    ----------------------------------------- 4:35 PM on 11/05/2018  -----------------------------------------  CBC is normal, comprehensive metabolic panel is normal, troponin is normal, urinalysis is normal  CTA for aortic dissection is negative CT of the T-spine and L-spine show multiple disc degenerative, and facet arthropathies.  Explained all of the lab and imaging findings to the patient.  She was given morphine 4 mg IV and Zofran 4 mg IV for the continued back pain here in the ED.  I do feel that this is mostly musculoskeletal and that she should follow-up with neurosurgery.  MRI may be needed.  I told  her the main concern with the upper back pain is that if she had an aortic aneurysm which she does not.  She is to follow-up with her regular doctor or Dr. Lysle Morales a [place].  She was given additional pain medication.  She is given a prescription for Medrol Dosepak, Skelaxin muscle relaxer, and Percocet.  She is to take a stool softener with the narcotic.  She states she understands will comply.  She was discharged in stable condition.  As part of my medical decision making, I reviewed the following data within the [place] notes reviewed and incorporated, Labs reviewed see above, EKG interpreted NSR, Old chart reviewed, Radiograph reviewed CTs as noted above, Notes from prior ED visits and [address] Controlled Substance Database  ____________________________________________   FINAL CLINICAL IMPRESSION(S) / ED DIAGNOSES  Final diagnoses:  Back pain  Acute upper back pain  Degenerative disc disease, lumbar      NEW MEDICATIONS STARTED DURING THIS VISIT:  New Prescriptions   METAXALONE (SKELAXIN) 800 MG TABLET    Take 1 tablet (800 mg total) by mouth 3 (three) times daily.   METHYLPREDNISOLONE (MEDROL DOSEPAK) 4 MG TBPK TABLET    Take 6 pills on day one then decrease by 1 pill each day   OXYCODONE-ACETAMINOPHEN (PERCOCET) 5-325 MG TABLET    Take 1 tablet by mouth every 4 (four) hours as needed for severe pain.      Note:  This document was prepared using Dragon voice recognition software and may include unintentional dictation errors.    Versie Starks, PA-C 11/05/18 1637    Nena Polio, MD 11/05/18 2116

## 2018-11-05 NOTE — Telephone Encounter (Signed)
Patient is calling office back and states that she is in excruciating pain, patient states that she refuses to go to ER and was told she would receive call within a hour. Please review and advised. KW

## 2018-11-05 NOTE — Telephone Encounter (Signed)
Patient states she was referred to the ED instead of a spinal specialist and now it is being suggested that she see a provider at [facility] that she doesn't trust .  Please provide a [facility] provider.

## 2018-11-05 NOTE — ED Triage Notes (Signed)
Pt states she was riding in her car about 10 days ago and felt something shift and since having lower back pain that radiates into the shoulder BL . Pt is unable to sit or lay down due to the pain. Standing is more comfortable.state she sw her PCP last Friday and given muscle relaxer.

## 2018-11-06 ENCOUNTER — Telehealth: Payer: Self-pay | Admitting: Family Medicine

## 2018-11-06 DIAGNOSIS — M5136 Other intervertebral disc degeneration, lumbar region: Secondary | ICD-10-CM | POA: Diagnosis not present

## 2018-11-06 DIAGNOSIS — M5135 Other intervertebral disc degeneration, thoracolumbar region: Secondary | ICD-10-CM

## 2018-11-06 DIAGNOSIS — M5137 Other intervertebral disc degeneration, lumbosacral region: Secondary | ICD-10-CM

## 2018-11-06 DIAGNOSIS — M7918 Myalgia, other site: Secondary | ICD-10-CM

## 2018-11-06 NOTE — Telephone Encounter (Signed)
Referral has been placed. 

## 2018-11-06 NOTE — Telephone Encounter (Signed)
Please advise 

## 2018-11-06 NOTE — Telephone Encounter (Signed)
Pt is requesting a referral to see Dr Arnoldo Morale for back pain ASAP.See Images from ER Yesterday.She does not feel she needs MRI since CT was done

## 2018-11-06 NOTE — Telephone Encounter (Signed)
Pt requesting referral to for [facility] - Dr. Earle Gell  Pt states she is in extreme pain and needs referral as soon as she can get one.

## 2018-11-06 NOTE — Telephone Encounter (Signed)
See previous message. Referral request has already been sent to provider.

## 2018-11-08 NOTE — Telephone Encounter (Signed)
Spoke with the pt and made her aware of Dr. Donivan Scull recommendation of Dr. Frederich Cha Neurosurgeon in [place].  Pt sts that she is already scheduled with him for tomorrow, he came recommended by a friend and she is glad Dr. Rockey Situ recommends him as well. Pt voiced appreciation for call.

## 2018-11-08 NOTE — Telephone Encounter (Signed)
Dr. Arnoldo Morale in [place] and his group thx [email or web address]

## 2018-11-09 DIAGNOSIS — M4316 Spondylolisthesis, lumbar region: Secondary | ICD-10-CM | POA: Diagnosis not present

## 2018-11-09 DIAGNOSIS — M48062 Spinal stenosis, lumbar region with neurogenic claudication: Secondary | ICD-10-CM | POA: Diagnosis not present

## 2018-11-09 DIAGNOSIS — M545 Low back pain: Secondary | ICD-10-CM | POA: Diagnosis not present

## 2018-11-09 DIAGNOSIS — Z6832 Body mass index (BMI) 32.0-32.9, adult: Secondary | ICD-10-CM | POA: Diagnosis not present

## 2018-11-09 DIAGNOSIS — R030 Elevated blood-pressure reading, without diagnosis of hypertension: Secondary | ICD-10-CM | POA: Diagnosis not present

## 2018-11-14 ENCOUNTER — Other Ambulatory Visit: Payer: Self-pay | Admitting: Neurosurgery

## 2018-11-14 DIAGNOSIS — M4316 Spondylolisthesis, lumbar region: Secondary | ICD-10-CM

## 2018-11-24 ENCOUNTER — Other Ambulatory Visit: Payer: Self-pay

## 2018-11-24 ENCOUNTER — Ambulatory Visit (HOSPITAL_COMMUNITY)
Admission: RE | Admit: 2018-11-24 | Discharge: 2018-11-24 | Disposition: A | Discharge status: Home / Self Care | Payer: Medicare HMO | Source: Ambulatory Visit | Attending: Neurosurgery | Admitting: Neurosurgery

## 2018-11-24 DIAGNOSIS — M4316 Spondylolisthesis, lumbar region: Secondary | ICD-10-CM | POA: Diagnosis not present

## 2018-12-03 ENCOUNTER — Ambulatory Visit: Payer: Self-pay | Admitting: Family Medicine

## 2018-12-04 DIAGNOSIS — M4316 Spondylolisthesis, lumbar region: Secondary | ICD-10-CM | POA: Diagnosis not present

## 2018-12-04 DIAGNOSIS — R030 Elevated blood-pressure reading, without diagnosis of hypertension: Secondary | ICD-10-CM | POA: Diagnosis not present

## 2018-12-04 DIAGNOSIS — M545 Low back pain: Secondary | ICD-10-CM | POA: Diagnosis not present

## 2018-12-04 DIAGNOSIS — Z6832 Body mass index (BMI) 32.0-32.9, adult: Secondary | ICD-10-CM | POA: Diagnosis not present

## 2018-12-18 DIAGNOSIS — M546 Pain in thoracic spine: Secondary | ICD-10-CM | POA: Diagnosis not present

## 2018-12-18 DIAGNOSIS — S233XXD Sprain of ligaments of thoracic spine, subsequent encounter: Secondary | ICD-10-CM | POA: Diagnosis not present

## 2018-12-18 DIAGNOSIS — R69 Illness, unspecified: Secondary | ICD-10-CM | POA: Diagnosis not present

## 2018-12-26 NOTE — Progress Notes (Signed)
Subjective:   Lauren Lloyd is a 78 y.o. female who presents for Medicare Annual (Subsequent) preventive examination.    This visit is being conducted through telemedicine due to the COVID-19 pandemic. This patient has given me verbal consent via doximity to conduct this visit, patient states they are participating from their home address. Some vital signs may be absent or patient reported.    Patient identification: identified by name, DOB, and current address  Review of Systems:  N/A  Cardiac Risk Factors include: dyslipidemia;advanced age (>22men, >44 women);hypertension;obesity (BMI >30kg/m2)     Objective:     Vitals: There were no vitals taken for this visit.  There is no height or weight on file to calculate BMI. Unable to obtain vitals due to visit being conducted via telephonically.   Advanced Directives 12/27/2018 11/05/2018 05/31/2018 05/30/2018 05/03/2018 05/03/2018 12/22/2017  Does Patient Have a Medical Advance Directive? Yes Yes - No No No Yes  Type of Paramedic of [place];Living will [place];Living will - - - - Press photographer;Living will  Does patient want to make changes to medical advance directive? - No - Patient declined - - - - -  Copy of [place] in Chart? No - copy requested No - copy requested - - - - No - copy requested  Would patient like information on creating a medical advance directive? - - No - Patient declined - No - Patient declined No - Patient declined -    Tobacco Social History   Tobacco Use  Smoking Status Former Smoker  . Packs/day: 1.00  . Years: 30.00  . Pack years: 30.00  . Types: Cigarettes  . Quit date: 05/16/1989  . Years since quitting: 29.6  Smokeless Tobacco Never Used     Counseling given: Not Answered   Clinical Intake:     Pain : 0-10 Pain Score: 5  Pain Type: Chronic pain Pain Location: Back Pain Orientation: Mid Pain Descriptors /  Indicators: Aching Pain Frequency: Constant     Nutritional Risks: None Diabetes: No  How often do you need to have someone help you when you read instructions, pamphlets, or other written materials from your doctor or pharmacy?: 1 - Never  Interpreter Needed?: No  Information entered by :: [facility], LPN  Past Medical History:  Diagnosis Date  . Arthritis    knees, Hands  . GERD (gastroesophageal reflux disease)   . Persistent atrial fibrillation    a.  Diagnosed 12/19; b. CHADS2VASc => 6 (CHF, HTN, age x 2, vascular disease, female); c. Eliquis   Past Surgical History:  Procedure Laterality Date  . CARDIOVERSION N/A 06/15/2018   Procedure: CARDIOVERSION (CATH LAB);  Surgeon: Minna Merritts, MD;  Location: [facility];  Service: Cardiovascular;  Laterality: N/A;  . CATARACT EXTRACTION W/PHACO Right 01/25/2016   Procedure: CATARACT EXTRACTION PHACO AND INTRAOCULAR LENS PLACEMENT ([place]);  Surgeon: Ronnell Freshwater, MD;  Location: [place];  Service: Ophthalmology;  Laterality: Right;  RIGHT  . CATARACT EXTRACTION W/PHACO Left 02/22/2016   Procedure: CATARACT EXTRACTION PHACO AND INTRAOCULAR LENS PLACEMENT ([facility]);  Surgeon: Ronnell Freshwater, MD;  Location: [place];  Service: Ophthalmology;  Laterality: Left;  LEFT  . [place]  2010  . KNEE ARTHROSCOPY Right 2004  . REPLACEMENT TOTAL KNEE Right 2009   [facility]  . SKIN GRAFT Left 04/08/2013   Done on left index finger  . TONSILLECTOMY  1946  Family History  Problem Relation Age of Onset  . Hyperlipidemia Sister   . Atrial fibrillation Sister   . Transient ischemic attack Mother   . Heart attack Father   . Healthy Brother   . Breast cancer Sister 33  . Hyperlipidemia Sister   . Atrial fibrillation Sister    Social History   Socioeconomic History  . Marital status: Divorced    Spouse name: Not on file  . Number of children: 5  . Years of education:  college  . Highest education level: Bachelor's degree (e.g., BA, AB, BS)  Occupational History  . Occupation: Part Time    Employer: [employer]  Social Needs  . Financial resource strain: Not hard at all  . Food insecurity    Worry: Never true    Inability: Never true  . Transportation needs    Medical: No    Non-medical: No  Tobacco Use  . Smoking status: Former Smoker    Packs/day: 1.00    Years: 30.00    Pack years: 30.00    Types: Cigarettes    Quit date: 05/16/1989    Years since quitting: 29.6  . Smokeless tobacco: Never Used  Substance and Sexual Activity  . Alcohol use: Yes    Alcohol/week: 14.0 standard drinks    Types: 7 Glasses of wine, 7 Standard drinks or equivalent per week  . Drug use: No  . Sexual activity: Not Currently  Lifestyle  . Physical activity    Days per week: 0 days    Minutes per session: 0 min  . Stress: Not at all  Relationships  . Social Herbalist on phone: Patient refused    Gets together: Patient refused    Attends religious service: Patient refused    Active member of club or organization: Patient refused    Attends meetings of clubs or organizations: Patient refused    Relationship status: Patient refused  Other Topics Concern  . Not on file  Social History Narrative   Pt has a child who passed away at age 58    Outpatient Encounter Medications as of 12/27/2018  Medication Sig  . apixaban (ELIQUIS) 5 MG TABS tablet Take 1 tablet (5 mg total) by mouth 2 (two) times daily for 30 days.  . baclofen (LIORESAL) 10 MG tablet Take 1 tablet (10 mg total) by mouth 3 (three) times daily. (Patient taking differently: Take 10 mg by mouth 3 (three) times daily. As needed)  . furosemide (LASIX) 40 MG tablet Take 1 tablet (40 mg total) by mouth daily.  [name] Kitchen HYDROcodone-acetaminophen (NORCO/VICODIN) 5-325 MG tablet Take 1 tablet by mouth every 6 (six) hours as needed for moderate pain.  [name] Kitchen loperamide (IMODIUM) 2 MG capsule Take 2 mg  by mouth as needed.   [name] Kitchen losartan (COZAAR) 50 MG tablet Take 1 tablet (50 mg total) by mouth daily.  . metaxalone (SKELAXIN) 800 MG tablet Take 1 tablet (800 mg total) by mouth 3 (three) times daily.  . metoprolol tartrate (LOPRESSOR) 25 MG tablet Take 1 tablet (25 mg total) by mouth 2 (two) times daily.  . Multiple Vitamin (MULTIVITAMIN) capsule Take 1 capsule by mouth daily.  . potassium chloride (K-DUR) 10 MEQ tablet Take 10 mEq by mouth daily. Take 1 tablet (10 meq) by mouth twice daily   . gabapentin (NEURONTIN) 100 MG capsule Take 2 capsules (200 mg total) by mouth at bedtime. (Patient not taking: Reported on 12/27/2018)  . methylPREDNISolone (MEDROL DOSEPAK) 4  MG TBPK tablet Take 6 pills on day one then decrease by 1 pill each day (Patient not taking: Reported on 12/27/2018)  . oxyCODONE-acetaminophen (PERCOCET) 5-325 MG tablet Take 1 tablet by mouth every 4 (four) hours as needed for severe pain. (Patient not taking: Reported on 12/27/2018)  . Vitamin D, Ergocalciferol, (DRISDOL) 1.25 MG (50000 UT) CAPS capsule Take 1 capsule (50,000 Units total) by mouth every 7 (seven) days. (Patient not taking: Reported on 12/27/2018)   No facility-administered encounter medications on file as of 12/27/2018.     Activities of Daily Living In your present state of health, do you have any difficulty performing the following activities: 12/27/2018 06/15/2018  Hearing? N N  Vision? N N  Comment Wears eye glasses daily. -  Difficulty concentrating or making decisions? N N  Walking or climbing stairs? Y N  Comment Due to knee pains. -  Dressing or bathing? N N  Doing errands, shopping? N -  Preparing Food and eating ? N -  Using the Toilet? N -  In the past six months, have you accidently leaked urine? Y -  Comment Occasionally when unable to hold bladder. -  Do you have problems with loss of bowel control? Y -  Comment Occasionally when taking imodium. -  Managing your Medications? N -  Managing your  Finances? N -  Housekeeping or managing your Housekeeping? N -  Some recent data might be hidden    Patient Care Team: Brita Romp Dionne Bucy, MD as PCP - General (Family Medicine) Minna Merritts, MD as Consulting Physician (Cardiology) Newman Pies, MD as Consulting Physician (Neurosurgery)    Assessment:   This is a routine wellness examination for Sapna.  Exercise Activities and Dietary recommendations Current Exercise Habits: The patient does not participate in regular exercise at present, Exercise limited by: orthopedic condition(s)  Goals    . Reduce caffeine intake     Recommend cutting back on coffee intake to 2 cups a day and increase water intake.     . Reduce portion size     Recommend decreasing portion sizes for each meal (3) and add in 2 healthy snacks in between.        Fall Risk: Fall Risk  12/27/2018 03/19/2018 12/22/2017 12/01/2016 11/04/2015  Falls in the past year? 0 0 Yes Yes Yes  Number falls in past yr: - - 2 or more 2 or more 2 or more  Comment - - "trips" tripped x 2 -  Injury with Fall? - - No No No  Follow up - - Falls prevention discussed Falls prevention discussed -    FALL RISK PREVENTION PERTAINING TO THE HOME:  Any stairs in or around the home? Yes  If so, are there any without handrails? No   Home free of loose throw rugs in walkways, pet beds, electrical cords, etc? Yes  Adequate lighting in your home to reduce risk of falls? Yes   ASSISTIVE DEVICES UTILIZED TO PREVENT FALLS:  Life alert? No  Use of a cane, walker or w/c? No  Grab bars in the bathroom? No  Shower chair or bench in shower? Yes  Elevated toilet seat or a handicapped toilet? No    TIMED UP AND GO:  Was the test performed? No .    Depression Screen PHQ 2/9 Scores 12/27/2018 12/22/2017 12/22/2017 12/01/2016  PHQ - 2 Score 0 0 0 0  PHQ- 9 Score - 3 - 3     Cognitive Function: Declined today.  6CIT Screen 12/01/2016  What Year? 0 points  What month? 0 points   What time? 0 points  Count back from 20 0 points  Months in reverse 0 points  Repeat phrase 0 points  Total Score 0    Immunization History  Administered Date(s) Administered  . Hepatitis A 11/25/1999, 09/19/2001  . Hepatitis A, Adult 11/25/1999, 09/19/2001  . IPV 02/11/2000  . Pneumococcal Conjugate-13 10/06/2014  . Pneumococcal Polysaccharide-23 12/01/2010  . Td 02/02/1998  . Tdap 09/11/2007, 02/27/2012  . Typhoid Inactivated 02/11/2000    Qualifies for Shingles Vaccine? Yes . Due for Shingrix. Education has been provided regarding the importance of this vaccine. Pt has been advised to call insurance company to determine out of pocket expense. Advised may also receive vaccine at local pharmacy or Health Dept. Verbalized acceptance and understanding.  Tdap: Up to date  Flu Vaccine: Due fall 2020  Pneumococcal Vaccine: Completed series  Screening Tests Health Maintenance  Topic Date Due  . INFLUENZA VACCINE  12/15/2018  . TETANUS/TDAP  02/26/2022  . DEXA SCAN  Completed  . PNA vac Low Risk Adult  Completed    Cancer Screenings:  Colorectal Screening: No longer required.   Mammogram: No longer required.   Bone Density: Completed 08/06/13. Results reflect NORMAL. No repeat needed unless advised by a physician.   Lung Cancer Screening: (Low Dose CT Chest recommended if Age 49-80 years, 30 pack-year currently smoking OR have quit w/in 15years.) does not qualify.   Additional Screening:  Vision Screening: Recommended annual ophthalmology exams for early detection of glaucoma and other disorders of the eye.  Dental Screening: Recommended annual dental exams for proper oral hygiene  Community Resource Referral:  CRR required this visit?  No       Plan:  I have personally reviewed and addressed the Medicare Annual Wellness questionnaire and have noted the following in the patient's chart:  A. Medical and social history B. Use of alcohol, tobacco or illicit drugs   C. Current medications and supplements D. Functional ability and status E.  Nutritional status F.  Physical activity G. Advance directives H. List of other physicians I.  Hospitalizations, surgeries, and ER visits in previous 12 months J.  [place] such as hearing and vision if needed, cognitive and depression L. Referrals and appointments   In addition, I have reviewed and discussed with patient certain preventive protocols, quality metrics, and best practice recommendations. A written personalized care plan for preventive services as well as general preventive health recommendations were provided to patient. Nurse Health Advisor  Signed,    Wafaa Deemer [place], [place]  8/84/1660 Nurse Health Advisor   Nurse Notes: None.

## 2018-12-27 ENCOUNTER — Other Ambulatory Visit: Payer: Self-pay

## 2018-12-27 ENCOUNTER — Ambulatory Visit (INDEPENDENT_AMBULATORY_CARE_PROVIDER_SITE_OTHER): Payer: Medicare HMO

## 2018-12-27 DIAGNOSIS — Z0000 Encounter for general adult medical examination without abnormal findings: Secondary | ICD-10-CM

## 2018-12-27 NOTE — Patient Instructions (Addendum)
[name] , Thank you for taking time to come for your Medicare Wellness Visit. I appreciate your ongoing commitment to your health goals. Please review the following plan we discussed and let me know if I can assist you in the future.   Screening recommendations/referrals: Colonoscopy: No longer required.  Mammogram: No longer required.  Bone Density: Up to date, previous DEXA was normal. No repeat needed unless indicated by a physician .  Recommended yearly ophthalmology/optometry visit for glaucoma screening and checkup Recommended yearly dental visit for hygiene and checkup  Vaccinations: Influenza vaccine: Due fall 2020 Pneumococcal vaccine: Completed series Tdap vaccine: Up to date, due 02/2022 Shingles vaccine: Pt declines today.     Advanced directives: Please bring a copy of your POA (Power of Attorney) and/or Living Will to your next appointment.   Conditions/risks identified: Continue to cut back on portion sizes and eat 3 small meals a day with two healthy snacks in between.   Next appointment: 01/03/19 @ 10:00 AM with Dr Brita Romp. Declined scheduling an AWV for 2021 at this time.    Preventive Care 65 Years and Older, Female Preventive care refers to lifestyle choices and visits with your health care provider that can promote health and wellness. What does preventive care include?  A yearly physical exam. This is also called an annual well check.  Dental exams once or twice a year.  Routine eye exams. Ask your health care provider how often you should have your eyes checked.  Personal lifestyle choices, including:  Daily care of your teeth and gums.  Regular physical activity.  Eating a healthy diet.  Avoiding tobacco and drug use.  Limiting alcohol use.  Practicing safe sex.  Taking low-dose aspirin every day.  Taking vitamin and mineral supplements as recommended by your health care provider. What happens during an annual well check? The services and  screenings done by your health care provider during your annual well check will depend on your age, overall health, lifestyle risk factors, and family history of disease. Counseling  Your health care provider may ask you questions about your:  Alcohol use.  Tobacco use.  Drug use.  Emotional well-being.  Home and relationship well-being.  Sexual activity.  Eating habits.  History of falls.  Memory and ability to understand (cognition).  Work and work Statistician.  Reproductive health. Screening  You may have the following tests or measurements:  Height, weight, and BMI.  Blood pressure.  Lipid and cholesterol levels. These may be checked every 5 years, or more frequently if you are over 34 years old.  Skin check.  Lung cancer screening. You may have this screening every year starting at age 79 if you have a 30-pack-year history of smoking and currently smoke or have quit within the past 15 years.  Fecal occult blood test (FOBT) of the stool. You may have this test every year starting at age 79.  Flexible sigmoidoscopy or colonoscopy. You may have a sigmoidoscopy every 5 years or a colonoscopy every 10 years starting at age 79.  Hepatitis C blood test.  Hepatitis B blood test.  Sexually transmitted disease (STD) testing.  Diabetes screening. This is done by checking your blood sugar (glucose) after you have not eaten for a while (fasting). You may have this done every 1-3 years.  Bone density scan. This is done to screen for osteoporosis. You may have this done starting at age 79.  Mammogram. This may be done every 1-2 years. Talk to your health  care provider about how often you should have regular mammograms. Talk with your health care provider about your test results, treatment options, and if necessary, the need for more tests. Vaccines  Your health care provider may recommend certain vaccines, such as:  Influenza vaccine. This is recommended every year.   Tetanus, diphtheria, and acellular pertussis (Tdap, Td) vaccine. You may need a Td booster every 10 years.  Zoster vaccine. You may need this after age 96.  Pneumococcal 13-valent conjugate (PCV13) vaccine. One dose is recommended after age 35.  Pneumococcal polysaccharide (PPSV23) vaccine. One dose is recommended after age 79. Talk to your health care provider about which screenings and vaccines you need and how often you need them. This information is not intended to replace advice given to you by your health care provider. Make sure you discuss any questions you have with your health care provider. Document Released: 05/29/2015 Document Revised: 01/20/2016 Document Reviewed: 03/03/2015 Elsevier Interactive Patient Education  2017 [place] Prevention in the Home Falls can cause injuries. They can happen to people of all ages. There are many things you can do to make your home safe and to help prevent falls. What can I do on the outside of my home?  Regularly fix the edges of walkways and driveways and fix any cracks.  Remove anything that might make you trip as you walk through a door, such as a raised step or threshold.  Trim any bushes or trees on the path to your home.  Use bright outdoor lighting.  Clear any walking paths of anything that might make someone trip, such as rocks or tools.  Regularly check to see if handrails are loose or broken. Make sure that both sides of any steps have handrails.  Any raised decks and porches should have guardrails on the edges.  Have any leaves, snow, or ice cleared regularly.  Use sand or salt on walking paths during winter.  Clean up any spills in your garage right away. This includes oil or grease spills. What can I do in the bathroom?  Use night lights.  Install grab bars by the toilet and in the tub and shower. Do not use towel bars as grab bars.  Use non-skid mats or decals in the tub or shower.  If you need to sit  down in the shower, use a plastic, non-slip stool.  Keep the floor dry. Clean up any water that spills on the floor as soon as it happens.  Remove soap buildup in the tub or shower regularly.  Attach bath mats securely with double-sided non-slip rug tape.  Do not have throw rugs and other things on the floor that can make you trip. What can I do in the bedroom?  Use night lights.  Make sure that you have a light by your bed that is easy to reach.  Do not use any sheets or blankets that are too big for your bed. They should not hang down onto the floor.  Have a firm chair that has side arms. You can use this for support while you get dressed.  Do not have throw rugs and other things on the floor that can make you trip. What can I do in the kitchen?  Clean up any spills right away.  Avoid walking on wet floors.  Keep items that you use a lot in easy-to-reach places.  If you need to reach something above you, use a strong step stool that has a grab  bar.  Keep electrical cords out of the way.  Do not use floor polish or wax that makes floors slippery. If you must use wax, use non-skid floor wax.  Do not have throw rugs and other things on the floor that can make you trip. What can I do with my stairs?  Do not leave any items on the stairs.  Make sure that there are handrails on both sides of the stairs and use them. Fix handrails that are broken or loose. Make sure that handrails are as long as the stairways.  Check any carpeting to make sure that it is firmly attached to the stairs. Fix any carpet that is loose or worn.  Avoid having throw rugs at the top or bottom of the stairs. If you do have throw rugs, attach them to the floor with carpet tape.  Make sure that you have a light switch at the top of the stairs and the bottom of the stairs. If you do not have them, ask someone to add them for you. What else can I do to help prevent falls?  Wear shoes that:  Do not have  high heels.  Have rubber bottoms.  Are comfortable and fit you well.  Are closed at the toe. Do not wear sandals.  If you use a stepladder:  Make sure that it is fully opened. Do not climb a closed stepladder.  Make sure that both sides of the stepladder are locked into place.  Ask someone to hold it for you, if possible.  Clearly mark and make sure that you can see:  Any grab bars or handrails.  First and last steps.  Where the edge of each step is.  Use tools that help you move around (mobility aids) if they are needed. These include:  Canes.  Walkers.  Scooters.  Crutches.  Turn on the lights when you go into a dark area. Replace any light bulbs as soon as they burn out.  Set up your furniture so you have a clear path. Avoid moving your furniture around.  If any of your floors are uneven, fix them.  If there are any pets around you, be aware of where they are.  Review your medicines with your doctor. Some medicines can make you feel dizzy. This can increase your chance of falling. Ask your doctor what other things that you can do to help prevent falls. This information is not intended to replace advice given to you by your health care provider. Make sure you discuss any questions you have with your health care provider. Document Released: 02/26/2009 Document Revised: 10/08/2015 Document Reviewed: 06/06/2014 Elsevier Interactive Patient Education  2017 [employer].

## 2019-01-02 NOTE — Progress Notes (Signed)
Patient: Lauren Lloyd, Female    DOB: 02-19-1941, 78 y.o.   MRN: [id number] Visit Date: 01/03/2019  Today's Provider: Lavon Paganini, MD   Chief Complaint  Patient presents with   Annual Exam   Subjective:    I, Porsha McClurkin CMA, am acting as a scribe for Lavon Paganini, MD.   AWE w/ [place] was on 12/27/2018  Complete Physical Kevia MARIELLA BLACKWELDER is a 78 y.o. female. She feels fairly well. She reports exercising none. She reports she is sleeping well.  ----------------------------------------------------------- Continues to have back pain.  Saw Dr Arnoldo Morale, had MRI, and is currently doing PT.  Working with a Clinical research associate in a pool and this helps the most.  Only tried low dose Gabapentin  Knees are very bothersome. Arthritis feels like it is getting worse. Would like to see [facility]  Difficulty affording Eliquis    Review of Systems  Constitutional: Positive for fatigue.  HENT: Positive for congestion.   Eyes: Negative.   Respiratory: Positive for shortness of breath.   Cardiovascular: Negative.   Gastrointestinal: Positive for diarrhea.  Endocrine: Negative.   Genitourinary: Negative.   Musculoskeletal: Positive for arthralgias and back pain.  Skin: Negative.   Allergic/Immunologic: Negative.   Neurological: Negative.   Hematological: Negative.   Psychiatric/Behavioral: Negative.     Social History   Socioeconomic History   Marital status: Divorced    Spouse name: Not on file   Number of children: 5   Years of education: college   Highest education level: Bachelor's degree (e.g., BA, AB, BS)  Occupational History   Occupation: Part Time    Employer: [employer]  Social Designer, fashion/clothing strain: Not hard at all   Food insecurity    Worry: Never true    Inability: Never true   Transportation needs    Medical: No    Non-medical: No  Tobacco Use   Smoking status: Former Smoker    Packs/day: 1.00    Years: 30.00    Pack  years: 30.00    Types: Cigarettes    Quit date: 05/16/1989    Years since quitting: 29.6   Smokeless tobacco: Never Used  Substance and Sexual Activity   Alcohol use: Yes    Alcohol/week: 14.0 standard drinks    Types: 7 Glasses of wine, 7 Standard drinks or equivalent per week   Drug use: No   Sexual activity: Not Currently  Lifestyle   Physical activity    Days per week: 0 days    Minutes per session: 0 min   Stress: Not at all  Relationships   Social connections    Talks on phone: Patient refused    Gets together: Patient refused    Attends religious service: Patient refused    Active member of club or organization: Patient refused    Attends meetings of clubs or organizations: Patient refused    Relationship status: Patient refused   Intimate partner violence    Fear of current or ex partner: Patient refused    Emotionally abused: Patient refused    Physically abused: Patient refused    Forced sexual activity: Patient refused  Other Topics Concern   Not on file  Social History Narrative   Pt has a child who passed away at age 24    Past Medical History:  Diagnosis Date   Arthritis    knees, Hands   GERD (gastroesophageal reflux disease)    Persistent atrial fibrillation  a.  Diagnosed 12/19; b. CHADS2VASc => 6 (CHF, HTN, age x 2, vascular disease, female); c. Eliquis     Patient Active Problem List   Diagnosis Date Noted   Dyspnea on exertion 08/28/2018   Degenerative cervical spinal stenosis 07/26/2018   Degenerative arthritis of left knee 07/26/2018   Persistent atrial fibrillation 06/06/2018   Paresthesias 05/30/2018   Chronic diastolic CHF (congestive heart failure) ([place]) 05/11/2018   Centrilobular emphysema ([place]) 05/11/2018   Smoker 05/11/2018   Leg edema 05/11/2018   A-fib ([place]) 05/03/2018   Hematuria 04/04/2017   Leg cramps 04/04/2017   Metatarsalgia of both feet 04/04/2017   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/2017    Hypertension 01/03/2017   Hemorrhoids 01/03/2017   Healthcare maintenance 01/03/2017   Chronic right shoulder pain 01/03/2017   Pulmonary hypertension, unspecified ([place]) 04/02/2015   Chest pain radiating to arm 02/17/2015   Acid reflux 11/18/2014   IBS (irritable bowel syndrome) 11/18/2014   Primary osteoarthritis of one hip 10/03/2011   L-S radiculopathy 09/29/2011   Arthritis of knee, degenerative 09/29/2011   S/P knee replacement 09/29/2011   Non-toxic uninodular goiter 06/20/2009   Cervical pain 08/20/2008   Cannot sleep 12/12/2007   Hypercholesteremia 07/30/2007    Past Surgical History:  Procedure Laterality Date   CARDIOVERSION N/A 06/15/2018   Procedure: CARDIOVERSION (CATH LAB);  Surgeon: Minna Merritts, MD;  Location: [facility];  Service: Cardiovascular;  Laterality: N/A;   CATARACT EXTRACTION W/PHACO Right 01/25/2016   Procedure: CATARACT EXTRACTION PHACO AND INTRAOCULAR LENS PLACEMENT ([place]);  Surgeon: Ronnell Freshwater, MD;  Location: [facility];  Service: Ophthalmology;  Laterality: Right;  RIGHT   CATARACT EXTRACTION W/PHACO Left 02/22/2016   Procedure: CATARACT EXTRACTION PHACO AND INTRAOCULAR LENS PLACEMENT ([place]);  Surgeon: Ronnell Freshwater, MD;  Location: [facility];  Service: Ophthalmology;  Laterality: Left;  LEFT   HAMMER TOE SURGERY  2010   KNEE ARTHROSCOPY Right 2004   REPLACEMENT TOTAL KNEE Right 2009   [facility]   SKIN GRAFT Left 04/08/2013   Done on left index finger   TONSILLECTOMY  1946    Her family history includes Atrial fibrillation in her sister and sister; Breast cancer (age of onset: 60) in her sister; Healthy in her brother; Heart attack in her father; Hyperlipidemia in her sister and sister; Transient ischemic attack in her mother.   Current Outpatient Medications:    furosemide (LASIX) 40 MG tablet, Take 1 tablet (40 mg total) by mouth daily., Disp: 90 tablet, Rfl:  3   HYDROcodone-acetaminophen (NORCO/VICODIN) 5-325 MG tablet, Take 1 tablet by mouth every 6 (six) hours as needed for moderate pain., Disp: 30 tablet, Rfl: 0   metaxalone (SKELAXIN) 800 MG tablet, Take 1 tablet (800 mg total) by mouth 3 (three) times daily., Disp: 90 tablet, Rfl: 1   Multiple Vitamin (MULTIVITAMIN) capsule, Take 1 capsule by mouth daily., Disp: , Rfl:    potassium chloride (K-DUR) 10 MEQ tablet, Take 10 mEq by mouth daily. Take 1 tablet (10 meq) by mouth twice daily , Disp: , Rfl:    apixaban (ELIQUIS) 5 MG TABS tablet, Take 1 tablet (5 mg total) by mouth 2 (two) times daily for 30 days., Disp: 60 tablet, Rfl: 11   gabapentin (NEURONTIN) 100 MG capsule, Take 2 capsules (200 mg total) by mouth at bedtime. (Patient not taking: Reported on 12/27/2018), Disp: 60 capsule, Rfl: 3   loperamide (IMODIUM) 2 MG capsule, Take 2 mg by mouth as needed. ,  Disp: , Rfl:    losartan (COZAAR) 50 MG tablet, Take 1 tablet (50 mg total) by mouth daily., Disp: 90 tablet, Rfl: 3   metoprolol tartrate (LOPRESSOR) 25 MG tablet, Take 1 tablet (25 mg total) by mouth 2 (two) times daily., Disp: 180 tablet, Rfl: 3   Vitamin D, Ergocalciferol, (DRISDOL) 1.25 MG (50000 UT) CAPS capsule, Take 1 capsule (50,000 Units total) by mouth every 7 (seven) days. (Patient not taking: Reported on 12/27/2018), Disp: 12 capsule, Rfl: 0  Patient Care Team: [name], MD as PCP - General (Family Medicine) Minna Merritts, MD as Consulting Physician (Cardiology) Newman Pies, MD as Consulting Physician (Neurosurgery)     Objective:    Vitals: BP 134/74 (BP Location: Left Arm, Patient Position: Sitting, Cuff Size: Normal)    Pulse 60    Temp 98.4 F (36.9 C) (Oral)    Ht 5\' 8"  (1.727 m)    Wt 216 lb 6.4 oz (98.2 kg)    SpO2 97%    BMI 32.90 kg/m   Physical Exam Vitals signs reviewed.  Constitutional:      General: She is not in acute distress.    Appearance: Normal appearance. She is  well-developed. She is not diaphoretic.  HENT:     Head: Normocephalic and atraumatic.     Right Ear: External ear normal.     Left Ear: External ear normal.  Eyes:     General: No scleral icterus.    Conjunctiva/sclera: Conjunctivae normal.     Pupils: Pupils are equal, round, and reactive to light.  Neck:     Musculoskeletal: Neck supple.     Thyroid: No thyromegaly.  Cardiovascular:     Rate and Rhythm: Normal rate and regular rhythm.     Pulses: Normal pulses.     Heart sounds: Normal heart sounds. No murmur.  Pulmonary:     Effort: Pulmonary effort is normal. No respiratory distress.     Breath sounds: Normal breath sounds. No wheezing or rales.  Abdominal:     General: Bowel sounds are normal. There is no distension.     Palpations: Abdomen is soft.     Tenderness: There is no abdominal tenderness. There is no guarding or rebound.  Musculoskeletal:        General: No deformity.     Right lower leg: No edema.     Left lower leg: No edema.  Lymphadenopathy:     Cervical: No cervical adenopathy.  Skin:    General: Skin is warm and dry.     Capillary Refill: Capillary refill takes less than 2 seconds.     Findings: No rash.  Neurological:     Mental Status: She is alert and oriented to person, place, and time. Mental status is at baseline.  Psychiatric:        Mood and Affect: Mood normal.        Behavior: Behavior normal.        Thought Content: Thought content normal.     Activities of Daily Living In your present state of health, do you have any difficulty performing the following activities: 12/27/2018 06/15/2018  Hearing? N N  Vision? N N  Comment Wears eye glasses daily. -  Difficulty concentrating or making decisions? N N  Walking or climbing stairs? Y N  Comment Due to knee pains. -  Dressing or bathing? N N  Doing errands, shopping? N -  Preparing Food and eating ? N -  Using the Toilet?  N -  In the past six months, have you accidently leaked urine? Y -    Comment Occasionally when unable to hold bladder. -  Do you have problems with loss of bowel control? Y -  Comment Occasionally when taking imodium. -  Managing your Medications? N -  Managing your Finances? N -  Housekeeping or managing your Housekeeping? N -  Some recent data might be hidden    Fall Risk Assessment Fall Risk  12/27/2018 03/19/2018 12/22/2017 12/01/2016 11/04/2015  Falls in the past year? 0 0 Yes Yes Yes  Number falls in past yr: - - 2 or more 2 or more 2 or more  Comment - - "trips" tripped x 2 -  Injury with Fall? - - No No No  Follow up - - Falls prevention discussed Falls prevention discussed -     Depression Screen PHQ 2/9 Scores 12/27/2018 12/22/2017 12/22/2017 12/01/2016  PHQ - 2 Score 0 0 0 0  PHQ- 9 Score - 3 - 3    6CIT Screen 01/03/2019  What Year? 0 points  What month? 0 points  What time? 0 points  Count back from 20 0 points  Months in reverse 0 points  Repeat phrase 2 points  Total Score 2       Assessment & Plan:    Annual Physical Reviewed patient's Family Medical History Reviewed and updated list of patient's medical providers Assessment of cognitive impairment was done Assessed patient's functional ability Established a written schedule for health screening [place] Completed and Reviewed  Exercise Activities and Dietary recommendations Goals     Reduce caffeine intake     Recommend cutting back on coffee intake to 2 cups a day and increase water intake.      Reduce portion size     Recommend decreasing portion sizes for each meal (3) and add in 2 healthy snacks in between.        Immunization History  Administered Date(s) Administered   Hepatitis A 11/25/1999, 09/19/2001   Hepatitis A, Adult 11/25/1999, 09/19/2001   IPV 02/11/2000   Pneumococcal Conjugate-13 10/06/2014   Pneumococcal Polysaccharide-23 12/01/2010   Td 02/02/1998   Tdap 09/11/2007, 02/27/2012   Typhoid Inactivated 02/11/2000     Health Maintenance  Topic Date Due   INFLUENZA VACCINE  12/15/2018   TETANUS/TDAP  02/26/2022   DEXA SCAN  Completed   PNA vac Low Risk Adult  Completed     Discussed health benefits of physical activity, and encouraged her to engage in regular exercise appropriate for her age and condition.    ------------------------------------------------------------------------------------------------------------   Problem List Items Addressed This Visit      Cardiovascular and Mediastinum   Hypertension    Well controlled Continue current medications Reviewed metabolic panel F/u in 6 months       Persistent atrial fibrillation    Followed by cardiology Rate controlled Continue eliquis - 1 month supply of samples given today Offered CCM referral to medication assistance, but patient declines at this time        Nervous and Auditory   L-S radiculopathy    Followed by Neurosurgery Completing PT at this time May benefit from gabapentin Will start 300mg  qhs Can increase to TID dosing if helpful      Relevant Medications   gabapentin (NEURONTIN) 300 MG capsule     Musculoskeletal and Integument   Arthritis of knee, degenerative    Chronic With chronic knee pain Seems to be worsening Referral  to Ortho      Relevant Orders   Ambulatory referral to Orthopedic Surgery     Other   Hypercholesteremia    Reviewed last lipid panel       Other Visit Diagnoses    Encounter for annual physical exam    -  Primary       Return in about 6 months (around 07/06/2019) for chronic disease f/u.   [name], [name]. Portions of this information were initially documented by [facility], CMA and reviewed by me for thoroughness and accuracy.    Arshia Spellman, Dionne Bucy, MD MPH [facility]

## 2019-01-03 ENCOUNTER — Other Ambulatory Visit: Payer: Self-pay

## 2019-01-03 ENCOUNTER — Encounter: Payer: Self-pay | Admitting: Family Medicine

## 2019-01-03 ENCOUNTER — Ambulatory Visit (INDEPENDENT_AMBULATORY_CARE_PROVIDER_SITE_OTHER): Payer: Medicare HMO | Admitting: Family Medicine

## 2019-01-03 VITALS — BP 134/74 | HR 60 | Temp 98.4°F | Ht 68.0 in | Wt 216.4 lb

## 2019-01-03 DIAGNOSIS — M5417 Radiculopathy, lumbosacral region: Secondary | ICD-10-CM

## 2019-01-03 DIAGNOSIS — M170 Bilateral primary osteoarthritis of knee: Secondary | ICD-10-CM

## 2019-01-03 DIAGNOSIS — E7800 Pure hypercholesterolemia, unspecified: Secondary | ICD-10-CM

## 2019-01-03 DIAGNOSIS — I4819 Other persistent atrial fibrillation: Secondary | ICD-10-CM

## 2019-01-03 DIAGNOSIS — Z0000 Encounter for general adult medical examination without abnormal findings: Secondary | ICD-10-CM | POA: Diagnosis not present

## 2019-01-03 DIAGNOSIS — I10 Essential (primary) hypertension: Secondary | ICD-10-CM

## 2019-01-03 MED ORDER — GABAPENTIN 300 MG PO CAPS: 300.0000 mg | ORAL_CAPSULE | Freq: Every day | ORAL | 1 refills | Status: DC

## 2019-01-03 NOTE — Assessment & Plan Note (Addendum)
Well controlled Continue current medications Reviewed metabolic panel F/u in 6 months  

## 2019-01-03 NOTE — Assessment & Plan Note (Signed)
Followed by Neurosurgery Completing PT at this time May benefit from gabapentin Will start 300mg  qhs Can increase to TID dosing if helpful

## 2019-01-03 NOTE — Patient Instructions (Signed)
Preventive Care 79 Years and Older, Female Preventive care refers to lifestyle choices and visits with your health care provider that can promote health and wellness. This includes:  A yearly physical exam. This is also called an annual well check.  Regular dental and eye exams.  Immunizations.  Screening for certain conditions.  Healthy lifestyle choices, such as diet and exercise. What can I expect for my preventive care visit? Physical exam Your health care provider will check:  Height and weight. These may be used to calculate body mass index (BMI), which is a measurement that tells if you are at a healthy weight.  Heart rate and blood pressure.  Your skin for abnormal spots. Counseling Your health care provider may ask you questions about:  Alcohol, tobacco, and drug use.  Emotional well-being.  Home and relationship well-being.  Sexual activity.  Eating habits.  History of falls.  Memory and ability to understand (cognition).  Work and work Statistician.  Pregnancy and menstrual history. What immunizations do I need?  Influenza (flu) vaccine  This is recommended every year. Tetanus, diphtheria, and pertussis (Tdap) vaccine  You may need a Td booster every 10 years. Varicella (chickenpox) vaccine  You may need this vaccine if you have not already been vaccinated. Zoster (shingles) vaccine  You may need this after age 33. Pneumococcal conjugate (PCV13) vaccine  One dose is recommended after age 33. Pneumococcal polysaccharide (PPSV23) vaccine  One dose is recommended after age 72. Measles, mumps, and rubella (MMR) vaccine  You may need at least one dose of MMR if you were born in 1957 or later. You may also need a second dose. Meningococcal conjugate (MenACWY) vaccine  You may need this if you have certain conditions. Hepatitis A vaccine  You may need this if you have certain conditions or if you travel or work in places where you may be exposed  to hepatitis A. Hepatitis B vaccine  You may need this if you have certain conditions or if you travel or work in places where you may be exposed to hepatitis B. Haemophilus influenzae type b (Hib) vaccine  You may need this if you have certain conditions. You may receive vaccines as individual doses or as more than one vaccine together in one shot (combination vaccines). Talk with your health care provider about the risks and benefits of combination vaccines. What tests do I need? Blood tests  Lipid and cholesterol levels. These may be checked every 5 years, or more frequently depending on your overall health.  Hepatitis C test.  Hepatitis B test. Screening  Lung cancer screening. You may have this screening every year starting at age 39 if you have a 30-pack-year history of smoking and currently smoke or have quit within the past 15 years.  Colorectal cancer screening. All adults should have this screening starting at age 36 and continuing until age 15. Your health care provider may recommend screening at age 23 if you are at increased risk. You will have tests every 1-10 years, depending on your results and the type of screening test.  Diabetes screening. This is done by checking your blood sugar (glucose) after you have not eaten for a while (fasting). You may have this done every 1-3 years.  Mammogram. This may be done every 1-2 years. Talk with your health care provider about how often you should have regular mammograms.  BRCA-related cancer screening. This may be done if you have a family history of breast, ovarian, tubal, or peritoneal cancers.  Other tests  Sexually transmitted disease (STD) testing.  Bone density scan. This is done to screen for osteoporosis. You may have this done starting at age 76. Follow these instructions at home: Eating and drinking  Eat a diet that includes fresh fruits and vegetables, whole grains, lean protein, and low-fat dairy products. Limit  your intake of foods with high amounts of sugar, saturated fats, and salt.  Take vitamin and mineral supplements as recommended by your health care provider.  Do not drink alcohol if your health care provider tells you not to drink.  If you drink alcohol: ? Limit how much you have to 0-1 drink a day. ? Be aware of how much alcohol is in your drink. In the [place] one drink equals one 12 oz bottle of beer (355 mL), one 5 oz glass of wine (148 mL), or one 1 oz glass of hard liquor (44 mL). Lifestyle  Take daily care of your teeth and gums.  Stay active. Exercise for at least 30 minutes on 5 or more days each week.  Do not use any products that contain nicotine or tobacco, such as cigarettes, e-cigarettes, and chewing tobacco. If you need help quitting, ask your health care provider.  If you are sexually active, practice safe sex. Use a condom or other form of protection in order to prevent STIs (sexually transmitted infections).  Talk with your health care provider about taking a low-dose aspirin or statin. What's next?  Go to your health care provider once a year for a well check visit.  Ask your health care provider how often you should have your eyes and teeth checked.  Stay up to date on all vaccines. This information is not intended to replace advice given to you by your health care provider. Make sure you discuss any questions you have with your health care provider. Document Released: 05/29/2015 Document Revised: 04/26/2018 Document Reviewed: 04/26/2018 Elsevier Patient Education  2020 [employer].

## 2019-01-03 NOTE — Assessment & Plan Note (Signed)
Reviewed last lipid panel

## 2019-01-03 NOTE — Assessment & Plan Note (Signed)
Chronic With chronic knee pain Seems to be worsening Referral to Ortho

## 2019-01-03 NOTE — Assessment & Plan Note (Signed)
Followed by cardiology Rate controlled Continue eliquis - 1 month supply of samples given today Offered CCM referral to medication assistance, but patient declines at this time

## 2019-01-29 ENCOUNTER — Telehealth: Payer: Self-pay | Admitting: Family Medicine

## 2019-01-29 NOTE — Telephone Encounter (Signed)
Pt needing her medication list faxed to her Orthopedic doctor for tomorrow's visit at 11 am.  Please fax to [phone].  Thanks, [employer]

## 2019-01-30 DIAGNOSIS — M170 Bilateral primary osteoarthritis of knee: Secondary | ICD-10-CM | POA: Diagnosis not present

## 2019-01-30 DIAGNOSIS — M1711 Unilateral primary osteoarthritis, right knee: Secondary | ICD-10-CM | POA: Diagnosis not present

## 2019-01-30 DIAGNOSIS — M1612 Unilateral primary osteoarthritis, left hip: Secondary | ICD-10-CM | POA: Diagnosis not present

## 2019-01-30 DIAGNOSIS — Z96651 Presence of right artificial knee joint: Secondary | ICD-10-CM | POA: Diagnosis not present

## 2019-01-30 DIAGNOSIS — M1712 Unilateral primary osteoarthritis, left knee: Secondary | ICD-10-CM | POA: Diagnosis not present

## 2019-01-30 DIAGNOSIS — M5417 Radiculopathy, lumbosacral region: Secondary | ICD-10-CM | POA: Diagnosis not present

## 2019-01-30 NOTE — Telephone Encounter (Signed)
Med list faxed to number below.   Thanks,    -Mickel Baas

## 2019-02-27 DIAGNOSIS — M47814 Spondylosis without myelopathy or radiculopathy, thoracic region: Secondary | ICD-10-CM | POA: Diagnosis not present

## 2019-03-11 ENCOUNTER — Other Ambulatory Visit: Payer: Self-pay | Admitting: Family Medicine

## 2019-03-15 ENCOUNTER — Telehealth: Payer: Self-pay | Admitting: Cardiovascular Disease

## 2019-03-15 NOTE — Telephone Encounter (Signed)
I spoke with the patient.  She states she has been having feelings of fatigue since she started having a-fib and was placed on her medications.  She is concerned that her meds are contributing to how she feels.  She feels that by the afternoon she just doesn't even want to put her clothes on.  I inquired what her BP/ HR are running and she has not been checking this consistently. I have asked her to please check these for the next 5 days and call us with readings.  She is aware to check readings about 1-2 hours after she takes her meds. The patient voices understanding and is agreeable.

## 2019-03-15 NOTE — Telephone Encounter (Signed)
Pt c/o medication issue:  1. Name of Medication: not sure.she is taking 4   2. How are you currently taking this medication (dosage and times per day)?   3. Are you having a reaction (difficulty breathing--STAT)? no  4. What is your medication issue? Feels as if she has no energy and exhausted.

## 2019-03-27 ENCOUNTER — Telehealth: Payer: Self-pay | Admitting: Cardiovascular Disease

## 2019-03-27 NOTE — Telephone Encounter (Signed)
Spoke with patient and she reports that she does not feel well. Reports that her blood pressures have been running high 150's and heart rates have been 75-135. She states that she does not feel well and has also not been resting well. She has gained 8 pounds due to COVID shut in but denies any swelling to her legs. Recommended that she try taking extra metoprolol and scheduled her to see Christell Faith PA-C on Monday. Confirmed appointment time and provider with her and let her know that I would send this message to him for review and if recommendations I would give her a call back. Discussed signs and symptoms that would require immediate evaluation in the ED. She was appreciative for the call with no further questions at this time.

## 2019-03-27 NOTE — Telephone Encounter (Signed)
Pt c/o BP issue: STAT if pt c/o blurred vision, one-sided weakness or slurred speech  1. What are your last 5 BP readings?  11/10 - 157/132  11/11 - 153/117   2. Are you having any other symptoms (ex. Dizziness, headache, blurred vision, passed out)? A little dizzy, feels terrible   3. What is your BP issue? Patient has noticed high blood pressure.  Please call to discuss.

## 2019-03-29 NOTE — Progress Notes (Signed)
Cardiology Office Note    Date:  04/01/2019   ID:  Lauren, Lloyd [birth year], MRN [id number]  PCP:  Lauren Crews, MD  Cardiologist:  Lauren Rogue, MD  Electrophysiologist:  None   Chief Complaint: Palpitations, shortness of breath, not feeling well  History of Present Illness:   Lauren Lloyd is a 77 y.o. female with history of coronary calcifications on prior chest CT in 2016, persistent Afib diagnosed in 04/2018 on Eliquis status post DCCV on 06/15/2018, HFpEF,pulmonary hypertension,aortic atherosclerosis, chronic lower extremity swelling, HTN, asthma, IBS, chronic back pain, andprior tobacco abuse quitting > 25 years priorwho presents for evaluation of palpitations, shortness of breath, not feeling well.   She was admitted to the hospital in 04/2018 with worsening SOB, leg swelling, weight gain, and abdominal bloating. She was found to be in new onset Afib with RVR. She was rate controlled and diuresed with symptom improvement. Echo during that admission showed an EF of 55-60%, mild focal hypertrophy of the septum, no RWMA, mild MR, moderately dilated LA measuring 47 mm, mildly dilated RV with normal RVSF, severely dilated RA, PASP 34 mmHg. In hospital follow up on 05/11/18 she remained in Afib with well controlled ventricular response. She was admitted to the hospital 05/2018 for paresthesias. She underwent CT of the head and MRI of the brain which were not acute. Carotid artery ultrasound showed no hemodynamically significant stenosis. Repeat echo showed an EF of 55-60%, no rWMA, mild concentric LVH, mild to moderate MR, mild to moderately dilated LA, RVSF normal, moderate TR, PASP 51 mmHg. She was seen by neurology who did not feel the patient had a CVA or TIA. They felt the patient had a radiculopathy or underlying peripheral neuropathy with recommendation for outpatient neurology follow up. She was seen in cardiology follow-up on 06/06/2018 and continued to note exertional  shortness of breath with lower extremity swelling.  Weight was stable at 216 pounds.  She was compliant with her anticoagulation.  She remained in Afib with controlled ventricular response.  In the setting of her lower extremity swelling, her diltiazem was decreased to 180 mg daily with titration of Lopressor to 50 mg twice daily.  She underwent successful cardioversion on 06/15/2018 following 2 shocks.  Following this, she called the office on 06/21/2018 noting BP readings in the 0000000 to [id number] systolic with heart rate in the mid 60s-80s bpm.  She also continued to note some exertional shortness of breath.  With this, the patient's primary cardiologist started her on losartan 50 mg daily.  She was seen in follow-up in 06/2018 and was doing well from a cardiac perspective, maintaining sinus rhythm.  She noted some improvement in her shortness of breath.  Given this, she underwent Lexiscan Myoview on 07/12/2018 which showed no significant ischemia, EF 59%, overall a low risk scan.  She was seen virtually in 08/2018 and continued on Lasix 40 mg daily.  It appears the predominant issue at that time was chronic back and knee pain.  She was subsequently seen in the ED on 11/05/2018 with upper back pain with CTA of the chest/abdomen/pelvis showing no evidence of aortic dissection or other acute vascular abnormality.  Other findings included an incidentally noted thyroid nodule, cholelithiasis without evidence of acute cholecystitis, scattered colonic diverticulosis without evidence of diverticulitis, 8 mm sclerotic focus in the right ischium, multilevel spondylosis, and moderate to advanced aortic atherosclerosis.  CT thoracic spine showed no acute abnormality with moderate to severe degenerative disc disease.  CT  lumbar spine showed severe spinal stenosis at L4-5 as well as severe right and moderately severe left foraminal stenosis at L5-S1.  She notified our office on 03/15/2019 of increased fatigue that she attributed dating  back to her diagnosis of A. fib.  She did not have specific BP/heart rate readings and was advised to call back with these.  She contacted our office again on 03/27/2019 with BP ranging from the 150s over 110s to 130s with heart rates ranging from the 70s to 130s bpm.  She reported just not feeling well with further details uncertain.  In this setting, appointment was made for today.  She comes in today noting a 1 to 2-week history of tachypalpitations, acute on chronic shortness of breath, and just an overall feeling of not feeling well/fatigue.  She did not really pay much attention to if she could feel her A. fib when it was initially diagnosed in 04/2018 as she was dealing with a significant URI at that time.  However, she was woken up out of sleep approximately 2 weeks ago with tachypalpitations that have been constant since.  With this, she has noted an increase in shortness of breath.  She denies any chest pain, dizziness, presyncope, or syncope.  No lower extremity swelling, abdominal tension, orthopnea, PND, or early satiety.  She has been compliant with anticoagulation, taking this twice a day, without missing any doses.  She does feel like she snores.  She indicates she was previously advised to undergo a sleep study many years ago though there were some hang-ups with insurance at the time.  She denies any recent illnesses, vomiting, diarrhea, or increased pain.   Labs: 10/2018 - Hgb 12.5, PLT 249, potassium 3.6, BUN 21, troponin 0 0.68, albumin 3.6, AST/ALT normal 05/2018 - total cholesterol 180, triglyceride 107, HDL 50, LDL 109 04/2018 - TSH normal, magnesium 2.6    Past Medical History:  Diagnosis Date   Arthritis    knees, Hands   GERD (gastroesophageal reflux disease)    Persistent atrial fibrillation ([place])    a.  Diagnosed 12/19; b. CHADS2VASc => 6 (CHF, HTN, age x 2, vascular disease, female); c. Eliquis    Past Surgical History:  Procedure Laterality Date   CARDIOVERSION  N/A 06/15/2018   Procedure: CARDIOVERSION (CATH LAB);  Surgeon: Lauren Merritts, MD;  Location: [facility];  Service: Cardiovascular;  Laterality: N/A;   CATARACT EXTRACTION W/PHACO Right 01/25/2016   Procedure: CATARACT EXTRACTION PHACO AND INTRAOCULAR LENS PLACEMENT ([place]);  Surgeon: Ronnell Freshwater, MD;  Location: [facility];  Service: Ophthalmology;  Laterality: Right;  RIGHT   CATARACT EXTRACTION W/PHACO Left 02/22/2016   Procedure: CATARACT EXTRACTION PHACO AND INTRAOCULAR LENS PLACEMENT ([place]);  Surgeon: Ronnell Freshwater, MD;  Location: [facility];  Service: Ophthalmology;  Laterality: Left;  LEFT   HAMMER TOE SURGERY  2010   KNEE ARTHROSCOPY Right 2004   REPLACEMENT TOTAL KNEE Right 2009   [facility]   SKIN GRAFT Left 04/08/2013   Done on left index finger   TONSILLECTOMY  1946    Current Medications: No outpatient medications have been marked as taking for the 04/01/19 encounter (Office Visit) with Rise Mu, PA-C.    Allergies:   Levofloxacin, Influenza vaccines, and Oysters [shellfish allergy]   Social History   Socioeconomic History   Marital status: Divorced    Spouse name: Not on file   Number of children: 5   Years of education: college   Highest  education level: Bachelor's degree (e.g., BA, AB, BS)  Occupational History   Occupation: Part Time    Employer: [employer]  Social Designer, fashion/clothing strain: Not hard at all   Food insecurity    Worry: Never true    Inability: Never true   Transportation needs    Medical: No    Non-medical: No  Tobacco Use   Smoking status: Former Smoker    Packs/day: 1.00    Years: 30.00    Pack years: 30.00    Types: Cigarettes    Quit date: 05/16/1989    Years since quitting: 29.8   Smokeless tobacco: Never Used  Substance and Sexual Activity   Alcohol use: Yes    Alcohol/week: 14.0 standard drinks    Types: 7 Glasses of wine, 7  Standard drinks or equivalent per week   Drug use: No   Sexual activity: Not Currently  Lifestyle   Physical activity    Days per week: 0 days    Minutes per session: 0 min   Stress: Not at all  Relationships   Social connections    Talks on phone: Patient refused    Gets together: Patient refused    Attends religious service: Patient refused    Active member of club or organization: Patient refused    Attends meetings of clubs or organizations: Patient refused    Relationship status: Patient refused  Other Topics Concern   Not on file  Social History Narrative   Pt has a child who passed away at age 36     Family History:  [name] family history includes Atrial fibrillation in her sister and sister; Breast cancer (age of onset: 55) in her sister; Healthy in her brother; Heart attack in her father; Hyperlipidemia in her sister and sister; Transient ischemic attack in her mother.  ROS:   Review of Systems  Constitutional: Positive for malaise/fatigue. Negative for chills, diaphoresis, fever and weight loss.  HENT: Negative for congestion.   Eyes: Negative for discharge and redness.  Respiratory: Positive for shortness of breath. Negative for cough, hemoptysis, sputum production and wheezing.   Cardiovascular: Positive for palpitations. Negative for chest pain, orthopnea, claudication, leg swelling and PND.  Gastrointestinal: Negative for abdominal pain, blood in stool, heartburn, melena, nausea and vomiting.  Genitourinary: Negative for hematuria.  Musculoskeletal: Negative for falls and myalgias.  Skin: Negative for rash.  Neurological: Positive for weakness. Negative for dizziness, tingling, tremors, sensory change, speech change, focal weakness and loss of consciousness.  Endo/Heme/Allergies: Does not bruise/bleed easily.  Psychiatric/Behavioral: Negative for substance abuse. The patient is not nervous/anxious.   All other systems reviewed and are  negative.    EKGs/Labs/Other Studies Reviewed:    Studies reviewed were summarized above. The additional studies were reviewed today: As above.  EKG:  EKG is ordered today.  The EKG ordered today demonstrates A. fib with RVR, 135 bpm, low voltage QRS, nonspecific ST-T changes  Recent Labs: 05/03/2018: B Natriuretic Peptide 376.0 05/04/2018: Magnesium 2.6; TSH 1.615 11/05/2018: ALT 17; BUN 21; Creatinine, Ser 0.68; Hemoglobin 12.5; Platelets 249; Potassium 3.6; Sodium 142  Recent Lipid Panel    Component Value Date/Time   CHOL 180 05/31/2018 0452   CHOL 269 (H) 12/28/2016 0823   TRIG 107 05/31/2018 0452   HDL 50 05/31/2018 0452   HDL 67 12/28/2016 0823   CHOLHDL 3.6 05/31/2018 0452   VLDL 21 05/31/2018 0452   LDLCALC 109 (H) 05/31/2018 0452   LDLCALC 169 (  H) 12/28/2016 [id number]    PHYSICAL EXAM:    VS:  BP 120/60 (BP Location: Left Arm, Patient Position: Sitting, Cuff Size: Normal)    Pulse (!) 135    Temp (!) 96.8 F (36 C)    Ht 5\' 7"  (1.702 m)    Wt 218 lb (98.9 kg)    BMI 34.14 kg/m   BMI: Body mass index is 34.14 kg/m.  Physical Exam  Constitutional: She is oriented to person, place, and time. She appears well-developed and well-nourished.  HENT:  Head: Normocephalic and atraumatic.  Eyes: Right eye exhibits no discharge. Left eye exhibits no discharge.  Neck: Normal range of motion. No JVD present.  Cardiovascular: S1 normal, S2 normal and normal heart sounds. An irregularly irregular rhythm present. Tachycardia present. Exam reveals no distant heart sounds, no friction rub, no midsystolic click and no opening snap.  No murmur heard. Pulses:      Posterior tibial pulses are 2+ on the right side and 2+ on the left side.  Pulmonary/Chest: Effort normal. No respiratory distress. She has decreased breath sounds. She has no wheezes. She has rales in the right lower field and the left lower field. She exhibits no tenderness.  Abdominal: Soft. She exhibits no distension. There  is no abdominal tenderness.  Musculoskeletal:        General: No edema.  Neurological: She is alert and oriented to person, place, and time.  Skin: Skin is warm and dry. No cyanosis. Nails show no clubbing.  Psychiatric: She has a normal mood and affect. Her speech is normal and behavior is normal. Judgment and thought content normal.  Vitals reviewed.   Wt Readings from Last 3 Encounters:  04/01/19 218 lb (98.9 kg)  01/03/19 216 lb 6.4 oz (98.2 kg)  11/05/18 218 lb (98.9 kg)     ReDs Vest 43%  ASSESSMENT & PLAN:   1. Persistent A. fib with RVR: Patient indicates onset of tachypalpitations occurred approximately 2 weeks prior.  She has been compliant with Eliquis, including twice daily dosing and denies missing any doses.  She previously underwent successful cardioversion 05/2018.  She remains tachycardic with heart rates in the 130s bpm.  Overall, outside of some shortness of breath and palpitations she is relatively asymptomatic.  Increase Lopressor to 50 mg twice daily.  Losartan has been discontinued as outlined below to allow for adequate BP room for rate control and escalation of diuretic.  Add amiodarone 400 mg twice daily for 7 days, followed by 200 mg twice daily for 7 days, followed by 200 mg daily thereafter.  Risks and benefits of amiodarone have been discussed in detail.  I have advised her to follow-up with her PCP regarding incidentally noted thyroid nodule as detailed below.  Check CMP, TSH, and CBC.  Continue Eliquis 5 mg twice daily given CHADS2VASc of at least 6 (CHF, HTN, age x 2, vascular disease, female).  She will be seen in 1 week to reassess rate control and volume status.  If she remains in A. fib at that time despite amiodarone loading and escalation of metoprolol, will plan for repeat DCCV.  2. Acute on chronic HFpEF/pulmonary hypertension: Patient is volume overloaded today with decreased breath sounds and crackles along the bilateral bases.  Reds vest of 43%.   Increase Lasix 40 mg twice daily for 3 days along with addition of KCl 20 mill equivalent daily for now pending updated renal function and potassium obtained today.  Temporarily hold losartan to allow for BP  room for diuresis.  Likely exacerbated by A. fib with RVR.  Plan for added rate and rhythm control strategy as outlined above.  Close follow-up in 1 week to reassess volume status.  3. Chronic dyspnea: Likely multifactorial including diastolic CHF, pulmonary hypertension, and underlying COPD with obesity and physical deconditioning.  Recent nonischemic stress test as outlined below.  No evidence of anemia on prior CBCs.  4. Coronary artery calcification/aortic atherosclerosis: No symptoms of chest pain.  She has chronic shortness of breath and has previously undergone nuclear stress testing in 06/2018 which was low risk as outlined above.  I suspect her worsening of chronic dyspnea is likely multifactorial including A. fib with RVR, acute on chronic HFpEF, and pulmonary hypertension.  Plan as outlined above.  Not currently on statin with most recent LDL of 109 from 05/2018 with goal LDL being less than 70 in the setting of coronary artery calcifications.  Revisit statin therapy after her acute A. fib/CHF exacerbation is improved  5. HTN: Blood pressure is well controlled in the office today.  Given need for escalation of rate control therapy and for diuresis, losartan has been temporarily discontinued.  Escalate Lasix and metoprolol as above.  Low-sodium diet.  Sleep study as outlined below.  Weight loss advised.  6. COPD: Appears stable.  Follow-up with PCP as directed.  Likely contributing to her pulmonary hypertension.  7. Thyroid nodule/abnormal CTA chest, abdomen, pelvis: Incidentally noted on recent imaging as outlined in the HPI.  Advised patient to contact her PCP for follow-up.  8. Sleep disordered breathing: Patient with persistent A. fib failing cardioversion x1 as well as pulmonary  hypertension.  She reports a history of snoring and having previously been advised to undergo a sleep study years prior to this was not completed secondary to insurance issues.  Refer for sleep study.  Disposition: F/u with Dr. Rockey Situ or an APP in 1 week.   Medication Adjustments/Labs and Tests Ordered: Current medicines are reviewed at length with the patient today.  Concerns regarding medicines are outlined above. Medication changes, Labs and Tests ordered today are summarized above and listed in the Patient Instructions accessible in Encounters.   Signed, Christell Faith, PA-C 04/01/2019 3:34 PM     [facility] [address] [place], [place] [address] [phone]

## 2019-04-01 ENCOUNTER — Ambulatory Visit (INDEPENDENT_AMBULATORY_CARE_PROVIDER_SITE_OTHER): Payer: Medicare HMO | Admitting: Physician Assistant

## 2019-04-01 ENCOUNTER — Other Ambulatory Visit
Admission: RE | Admit: 2019-04-01 | Discharge: 2019-04-01 | Disposition: A | Discharge status: Home / Self Care | Payer: Medicare HMO | Source: Ambulatory Visit | Attending: Physician Assistant | Admitting: Physician Assistant

## 2019-04-01 ENCOUNTER — Encounter: Payer: Self-pay | Admitting: Physician Assistant

## 2019-04-01 ENCOUNTER — Other Ambulatory Visit: Payer: Self-pay

## 2019-04-01 ENCOUNTER — Telehealth: Payer: Self-pay | Admitting: Cardiovascular Disease

## 2019-04-01 VITALS — BP 120/60 | HR 135 | Temp 96.8°F | Ht 67.0 in | Wt 218.0 lb

## 2019-04-01 DIAGNOSIS — I272 Pulmonary hypertension, unspecified: Secondary | ICD-10-CM

## 2019-04-01 DIAGNOSIS — I10 Essential (primary) hypertension: Secondary | ICD-10-CM | POA: Diagnosis not present

## 2019-04-01 DIAGNOSIS — R9389 Abnormal findings on diagnostic imaging of other specified body structures: Secondary | ICD-10-CM | POA: Diagnosis not present

## 2019-04-01 DIAGNOSIS — J432 Centrilobular emphysema: Secondary | ICD-10-CM

## 2019-04-01 DIAGNOSIS — I2510 Atherosclerotic heart disease of native coronary artery without angina pectoris: Secondary | ICD-10-CM

## 2019-04-01 DIAGNOSIS — M5416 Radiculopathy, lumbar region: Secondary | ICD-10-CM | POA: Diagnosis not present

## 2019-04-01 DIAGNOSIS — R0609 Other forms of dyspnea: Secondary | ICD-10-CM | POA: Diagnosis not present

## 2019-04-01 DIAGNOSIS — I5033 Acute on chronic diastolic (congestive) heart failure: Secondary | ICD-10-CM

## 2019-04-01 DIAGNOSIS — M48062 Spinal stenosis, lumbar region with neurogenic claudication: Secondary | ICD-10-CM | POA: Diagnosis not present

## 2019-04-01 DIAGNOSIS — E041 Nontoxic single thyroid nodule: Secondary | ICD-10-CM | POA: Diagnosis not present

## 2019-04-01 DIAGNOSIS — I2584 Coronary atherosclerosis due to calcified coronary lesion: Secondary | ICD-10-CM | POA: Diagnosis not present

## 2019-04-01 DIAGNOSIS — I4819 Other persistent atrial fibrillation: Secondary | ICD-10-CM | POA: Insufficient documentation

## 2019-04-01 DIAGNOSIS — M62830 Muscle spasm of back: Secondary | ICD-10-CM | POA: Diagnosis not present

## 2019-04-01 DIAGNOSIS — M5136 Other intervertebral disc degeneration, lumbar region: Secondary | ICD-10-CM | POA: Diagnosis not present

## 2019-04-01 LAB — COMPREHENSIVE METABOLIC PANEL
ALT: 27 U/L (ref 0–44)
AST: 23 U/L (ref 15–41)
Albumin: 4 g/dL (ref 3.5–5.0)
Alkaline Phosphatase: 81 U/L (ref 38–126)
Anion gap: 12 (ref 5–15)
BUN: 32 mg/dL — ABNORMAL HIGH (ref 8–23)
CO2: 22 mmol/L (ref 22–32)
Calcium: 9.5 mg/dL (ref 8.9–10.3)
Chloride: 105 mmol/L (ref 98–111)
Creatinine, Ser: 1.22 mg/dL — ABNORMAL HIGH (ref 0.44–1.00)
GFR calc Af Amer: 49 mL/min — ABNORMAL LOW (ref >60)
GFR calc non Af Amer: 42 mL/min — ABNORMAL LOW (ref >60)
Glucose, Bld: 105 mg/dL — ABNORMAL HIGH (ref 70–99)
Potassium: 4.7 mmol/L (ref 3.5–5.1)
Sodium: 139 mmol/L (ref 135–145)
Total Bilirubin: 0.8 mg/dL (ref 0.3–1.2)
Total Protein: 7 g/dL (ref 6.5–8.1)

## 2019-04-01 LAB — CBC
HCT: 38.4 % (ref 36.0–46.0)
Hemoglobin: 13 g/dL (ref 12.0–15.0)
MCH: 30.7 pg (ref 26.0–34.0)
MCHC: 33.9 g/dL (ref 30.0–36.0)
MCV: 90.8 fL (ref 80.0–100.0)
Platelets: 274 10*3/uL (ref 150–400)
RBC: 4.23 MIL/uL (ref 3.87–5.11)
RDW: 12.7 % (ref 11.5–15.5)
WBC: 8.8 10*3/uL (ref 4.0–10.5)
nRBC: 0 % (ref 0.0–0.2)

## 2019-04-01 LAB — TSH: TSH: 1.193 u[IU]/mL (ref 0.350–4.500)

## 2019-04-01 MED ORDER — AMIODARONE HCL 200 MG PO TABS: ORAL_TABLET | ORAL | 0 refills | Status: DC

## 2019-04-01 MED ORDER — METOPROLOL TARTRATE 50 MG PO TABS: 50.0000 mg | ORAL_TABLET | Freq: Two times a day (BID) | ORAL | 0 refills | Status: DC

## 2019-04-01 NOTE — Patient Instructions (Addendum)
Medication Instructions:   Your physician has recommended you make the following change in your medication:  1. STOP Losartan  2. INCREASE Lopressor- Take 1 tablet (50MG ) by mouth TWICE a day 3. Take LASIX- 40MG  by mouth TWICE a day for 3 days then Take 40 MG by mouth daily.  4. START Amiodarone 200 MG- Take 2 tablets (400MG ) by mouth TWICE a day for 7 days         Take 1 tablet (200MG ) my mouth TWICE a day         Take 1 tablet (200MG ) by mouth daily.  5. Take Potassium (10MEQ)- take 2 tablets by mouth daily.   *If you need a refill on your cardiac medications before your next appointment, please call your pharmacy*  Lab Work:  1. Your physician recommends that you have lab work at the medical mall TODAY: CMET, TSH, CBC  If you have labs (blood work) drawn today and your tests are completely normal, you will receive your results only by: [name] Kitchen MyChart Message (if you have MyChart) OR . A paper copy in the mail If you have any lab test that is abnormal or we need to change your treatment, we will call you to review the results.  Testing/Procedures:  1. Your physician has recommended that you have a sleep study. This test records several body functions during sleep, including: brain activity, eye movement, oxygen and carbon dioxide blood levels, heart rate and rhythm, breathing rate and rhythm, the flow of air through your mouth and nose, snoring, body muscle movements, and chest and belly movement.    Follow-Up: At [facility], you and your health needs are our priority.  As part of our continuing mission to provide you with exceptional heart care, we have created designated Provider Care Teams.  These Care Teams include your primary Cardiologist (physician) and Advanced Practice Providers (APPs -  Physician Assistants and Nurse Practitioners) who all work together to provide you with the care you need, when you need it.  Your next appointment:   1 week  The format for your next  appointment:   In Person  Provider:   Christell Faith, PA-C

## 2019-04-01 NOTE — Telephone Encounter (Signed)
Attempted to scheduled fu .  Patient can only do Monday and next available on Tuesday patient will look at schedule and call back in the morning

## 2019-04-02 ENCOUNTER — Telehealth: Payer: Self-pay | Admitting: *Deleted

## 2019-04-02 DIAGNOSIS — Z79899 Other long term (current) drug therapy: Secondary | ICD-10-CM

## 2019-04-02 DIAGNOSIS — I5032 Chronic diastolic (congestive) heart failure: Secondary | ICD-10-CM

## 2019-04-02 NOTE — Telephone Encounter (Signed)
No answer. Left message to call back.   

## 2019-04-02 NOTE — Addendum Note (Signed)
Addended by: Janan Ridge on: 04/02/2019 08:30 AM   Modules accepted: Orders

## 2019-04-02 NOTE — Telephone Encounter (Signed)
-----   Message from Rise Mu, PA-C sent at 04/02/2019  7:15 AM EST ----- Thyroid function normal. Potassium at goal. Random glucose okay. Kidney function is mildly elevated from prior.  This may be in the setting of congestion given her volume overload. Liver function normal. Continue with short course of outpatient diuresis. Follow-up BMET early next week.

## 2019-04-02 NOTE — Telephone Encounter (Signed)
Spoke with patient concerning labs and scheduled appointment as well in another encounter. This one may be closed.

## 2019-04-02 NOTE — Telephone Encounter (Signed)
Patient is returning your call.  

## 2019-04-02 NOTE — Telephone Encounter (Signed)
Results called to pt. Pt verbalized understanding of results and plan of care. Patient also needs 1 week follow up and BMET.  Patient has a hard time scheduling due to her work schedule. Scheduled patient for next Tuesday with Sharolyn Douglas, NP. She's going to go to the [facility] on Monday for the lab work so we will have results at the time of appointment on Tuesday.

## 2019-04-04 ENCOUNTER — Telehealth: Payer: Self-pay | Admitting: *Deleted

## 2019-04-04 DIAGNOSIS — G4700 Insomnia, unspecified: Secondary | ICD-10-CM

## 2019-04-04 DIAGNOSIS — I10 Essential (primary) hypertension: Secondary | ICD-10-CM

## 2019-04-04 NOTE — Telephone Encounter (Signed)
ordering provider changed.

## 2019-04-04 NOTE — Telephone Encounter (Signed)
RE: Sleep Study [place], [place], CMA  Freada Bergeron, CMA        Good Afternoon Gae Bon,   I have placed the order.   Patient Name: Lauren Lloyd  MRN: [id number]  Dx: Snoring, New onset Afib.

## 2019-04-04 NOTE — Telephone Encounter (Signed)
PA for split night sleep study submitted to [employer].

## 2019-04-05 ENCOUNTER — Other Ambulatory Visit: Payer: Self-pay | Admitting: Family Medicine

## 2019-04-05 DIAGNOSIS — Z1231 Encounter for screening mammogram for malignant neoplasm of breast: Secondary | ICD-10-CM

## 2019-04-09 ENCOUNTER — Ambulatory Visit: Payer: Medicare HMO | Admitting: Nurse Practitioner

## 2019-04-15 ENCOUNTER — Other Ambulatory Visit
Admission: RE | Admit: 2019-04-15 | Discharge: 2019-04-15 | Disposition: A | Discharge status: Home / Self Care | Payer: Medicare HMO | Source: Ambulatory Visit | Attending: Physician Assistant | Admitting: Physician Assistant

## 2019-04-15 ENCOUNTER — Telehealth: Payer: Self-pay | Admitting: *Deleted

## 2019-04-15 DIAGNOSIS — I5032 Chronic diastolic (congestive) heart failure: Secondary | ICD-10-CM | POA: Diagnosis not present

## 2019-04-15 DIAGNOSIS — Z79899 Other long term (current) drug therapy: Secondary | ICD-10-CM | POA: Insufficient documentation

## 2019-04-15 LAB — BASIC METABOLIC PANEL
Anion gap: 12 (ref 5–15)
BUN: 27 mg/dL — ABNORMAL HIGH (ref 8–23)
CO2: 24 mmol/L (ref 22–32)
Calcium: 8.8 mg/dL — ABNORMAL LOW (ref 8.9–10.3)
Chloride: 106 mmol/L (ref 98–111)
Creatinine, Ser: 1.22 mg/dL — ABNORMAL HIGH (ref 0.44–1.00)
GFR calc Af Amer: 49 mL/min — ABNORMAL LOW (ref >60)
GFR calc non Af Amer: 42 mL/min — ABNORMAL LOW (ref >60)
Glucose, Bld: 100 mg/dL — ABNORMAL HIGH (ref 70–99)
Potassium: 3.9 mmol/L (ref 3.5–5.1)
Sodium: 142 mmol/L (ref 135–145)

## 2019-04-15 NOTE — Progress Notes (Signed)
Date:  04/15/2019   ID:  Sheny, Calendine 1941/03/02, MRN [id number]  Patient Location:  [address] [place] [address]   Provider location:   [facility], [place] office  PCP:  [name], MD  Cardiologist:  Arvid Right [facility]  Chief Complaint  Patient presents with  . OTHER    1 wk f/u c/o sob. Meds reviewed verbally with pt.     History of Present Illness:    Lauren Lloyd is a 78 y.o. female  past medical history of Aortic atherosclerosis on CT scan 2020 hypertension,  asthma  Chronic lower extremity swelling Hospital admission for atrial fibrillation December 2019 Associated acute diastolic CHF Status post successful DCCV on 06/15/2018.  NSR on 06/2018 stress test, 07/2018 Showing no significant ischemia Mild lower extremity swelling, chronic Who presents for hospital follow-up, follow-up of her atrial fibrillation and acute on chronic diastolic CHF  First week in Nov 2020, developed atrial fib Happened while she was sleeping, she is uncertain if she has sleep apnea Seen in clinic 11/16 Started on amiodarone, higher dose metoprolol Lasix increased,40 BID 3 days then back to 40 daily She continued on 40 BID until today Was feeling good until the past 2 days very SOB with exertion high water intake >8 glasses a day  "horrible cough" for 3 years Started from infection at that time Slightly yellow  REDS vest today 40% REDS vest 43% on 04/01/2019  EKG personally reviewed by myself on todays visit Shows normal sinus rhythm/sinus bradycardia rate 50 bpm no significant ST-T wave changes  Other past medical history reviewed stress test, 07/2018 Showing no significant ischemia normal ejection fraction normal perfusion  Presented to the hospital 04/2018 with worsening shortness of breath, leg swelling, weight gain, abdominal bloating and new atrial fibrillation with RVR Initially on Cardizem infusion, changed to oral Cardizem  with titration upwards Discharged on Cardizem CD 300 mg and metoprolol 25 mg bid, Eliquis. Treated with IV Lasix during her hospital course with marked improvement in her shortness of breath  Carotid ultrasound January 2020 Less than 39% disease bilaterally   Past Medical History:  Diagnosis Date  . Arthritis    knees, Hands  . GERD (gastroesophageal reflux disease)   . Persistent atrial fibrillation ([place])    a.  Diagnosed 12/19; b. CHADS2VASc => 6 (CHF, HTN, age x 2, vascular disease, female); c. Eliquis   Past Surgical History:  Procedure Laterality Date  . CARDIOVERSION N/A 06/15/2018   Procedure: CARDIOVERSION (CATH LAB);  Surgeon: Minna Merritts, MD;  Location: [facility] ORS;  Service: Cardiovascular;  Laterality: N/A;  . CATARACT EXTRACTION W/PHACO Right 01/25/2016   Procedure: CATARACT EXTRACTION PHACO AND INTRAOCULAR LENS PLACEMENT ([place]);  Surgeon: Ronnell Freshwater, MD;  Location: [place];  Service: Ophthalmology;  Laterality: Right;  RIGHT  . CATARACT EXTRACTION W/PHACO Left 02/22/2016   Procedure: CATARACT EXTRACTION PHACO AND INTRAOCULAR LENS PLACEMENT ([facility]);  Surgeon: Ronnell Freshwater, MD;  Location: [place];  Service: Ophthalmology;  Laterality: Left;  LEFT  . [place]  2010  . KNEE ARTHROSCOPY Right 2004  . REPLACEMENT TOTAL KNEE Right 2009   [facility]  . SKIN GRAFT Left 04/08/2013   Done on left index finger  . TONSILLECTOMY  1946     No outpatient medications have been marked as taking for the 04/16/19 encounter (Appointment) with Minna Merritts, MD.     Allergies:  Levofloxacin, Influenza vaccines, and Oysters [shellfish allergy]   Social History   Tobacco Use  . Smoking status: Former Smoker    Packs/day: 1.00    Years: 30.00    Pack years: 30.00    Types: Cigarettes    Quit date: 05/16/1989    Years since quitting: 29.9  . Smokeless tobacco: Never Used  Substance Use Topics  .  Alcohol use: Yes    Alcohol/week: 14.0 standard drinks    Types: 7 Glasses of wine, 7 Standard drinks or equivalent per week  . Drug use: No     Current Outpatient Medications on File Prior to Visit  Medication Sig Dispense Refill  . amiodarone (PACERONE) 200 MG tablet Take 2 tablets (400MG ) by mouth TWICE a day for 7 days- THEN take 1 tablet (200MG ) by mouth TWICE a day 42 tablet 0  . apixaban (ELIQUIS) 5 MG TABS tablet Take 1 tablet (5 mg total) by mouth 2 (two) times daily for 30 days. 60 tablet 11  . furosemide (LASIX) 40 MG tablet Take 1 tablet (40 mg total) by mouth daily. 90 tablet 3  . gabapentin (NEURONTIN) 300 MG capsule TAKE 1 CAPSULE AT BEDTIME 30 capsule 1  . HYDROcodone-acetaminophen (NORCO/VICODIN) 5-325 MG tablet Take 1 tablet by mouth every 6 (six) hours as needed for moderate pain. 30 tablet 0  . loperamide (IMODIUM) 2 MG capsule Take 2 mg by mouth as needed.     . metaxalone (SKELAXIN) 800 MG tablet Take 1 tablet (800 mg total) by mouth 3 (three) times daily. 90 tablet 1  . metoprolol tartrate (LOPRESSOR) 50 MG tablet Take 1 tablet (50 mg total) by mouth 2 (two) times daily. 180 tablet 0  . Multiple Vitamin (MULTIVITAMIN) capsule Take 1 capsule by mouth daily.    . potassium chloride (K-DUR) 10 MEQ tablet Take 10 mEq by mouth 2 (two) times daily.     No current facility-administered medications on file prior to visit.      Family Hx: [name] family history includes Atrial fibrillation in her sister and sister; Breast cancer (age of onset: 21) in her sister; Healthy in her brother; Heart attack in her father; Hyperlipidemia in her sister and sister; Transient ischemic attack in her mother.  ROS:   Please see the history of present illness.    Review of Systems  Constitutional: Negative.   Respiratory: Positive for shortness of breath.   Cardiovascular: Negative.   Gastrointestinal: Negative.   Musculoskeletal: Positive for back pain and joint pain.   Neurological: Negative.   Psychiatric/Behavioral: Negative.   All other systems reviewed and are negative.    Labs/Other Tests and Data Reviewed:    Recent Labs: 05/03/2018: B Natriuretic Peptide 376.0 05/04/2018: Magnesium 2.6 04/01/2019: ALT 27; Hemoglobin 13.0; Platelets 274; TSH 1.193 04/15/2019: BUN 27; Creatinine, Ser 1.22; Potassium 3.9; Sodium 142   Recent Lipid Panel Lab Results  Component Value Date/Time   CHOL 180 05/31/2018 04:52 AM   CHOL 269 (H) 12/28/2016 08:23 AM   TRIG 107 05/31/2018 04:52 AM   HDL 50 05/31/2018 04:52 AM   HDL 67 12/28/2016 08:23 AM   CHOLHDL 3.6 05/31/2018 04:52 AM   LDLCALC 109 (H) 05/31/2018 04:52 AM   LDLCALC 169 (H) 12/28/2016 08:23 AM    Wt Readings from Last 3 Encounters:  04/01/19 218 lb (98.9 kg)  01/03/19 216 lb 6.4 oz (98.2 kg)  11/05/18 218 lb (98.9 kg)     Exam:    BP (!) 164/70 (BP  Location: Left Arm, Patient Position: Sitting, Cuff Size: Normal)   Pulse (!) 50   Ht 5' 7.5" (1.715 m)   Wt 219 lb (99.3 kg)   SpO2 96%   BMI 33.79 kg/m  Constitutional:  oriented to person, place, and time. No distress.  Obese HENT:  Head: Grossly normal Eyes:  no discharge. No scleral icterus.  Neck: No JVD, no carotid bruits  Cardiovascular: Regular rate and rhythm, no murmurs appreciated Pulmonary/Chest: Clear to auscultation bilaterally, no wheezes or rales Dullness at the bases Abdominal: Soft.  no distension.  no tenderness.  Musculoskeletal: Normal range of motion Neurological:  normal muscle tone. Coordination normal. No atrophy Skin: Skin warm and dry Psychiatric: normal affect, pleasant  ASSESSMENT & PLAN:    Paroxysmal atrial fibrillation (HCC) -  Back in sinus rhythm, bradycardic Also reports having some underlying lung issues, prior asthma Worsening shortness of breath past day or 2 Recommended that she stay on Eliquis We will hold the metoprolol tartrate and changed to bisoprolol 5 twice daily She stopped  amiodarone , which we will restart 200 mg daily to maintain normal sinus rhythm  Chronic diastolic CHF (congestive heart failure) (HCC) She has been taking Lasix 40 twice daily over the past 2 weeks REDS vest still elevated 40% today Renal function mildly elevated but stable Recommend she stay on Lasix 40 daily, cut back on her fluid intake, Lasix 40 extra after lunch only as needed for ankle swelling abdominal bloating weight gain worsening shortness of breath She has not been taking potassium faithfully, recommend she take at least 10 mEq daily  Leg edema Minimal edema on today's visit We will avoid calcium channel blockers  Smoker Reports prior history of smoking Likely with underlying COPD Does not take inhalers on a regular basis Recent shortness of breath could be secondary to COPD exacerbation, Will change beta-blockers as above  Centrilobular emphysema (HCC)  Reports having chronic cough past 3 years, prior severe infection Unable to exclude underlying bronchiectasis She may benefit from pulmonary work-up Also may benefit from steroid inhaler, more frequent use of her Ventolin  Pulmonary hypertension, unspecified (HCC) Moderate pulmonary hypertension seen on prior echocardiogram Continue Lasix 40 daily She has completed Lasix 40 twice daily, had high fluid intake which she will adjust  Back pain, knee pain Feels that she needs knee replacement surgery but is putting it off    Total encounter time more than 25 minutes  Greater than 50% was spent in counseling and coordination of care with the patient   Disposition: Follow-up in 1 month   Signed, Ida Rogue, MD  04/15/2019 6:41 PM    [place] [facility] [address], [place], [place] [address]

## 2019-04-15 NOTE — Telephone Encounter (Signed)
Aff message sent to [place] received. Ok to schedule sleep study. Auth # [id number]. Valid dates 04/07/19 to 10/04/19.

## 2019-04-16 ENCOUNTER — Encounter: Payer: Self-pay | Admitting: Cardiovascular Disease

## 2019-04-16 ENCOUNTER — Other Ambulatory Visit: Payer: Self-pay

## 2019-04-16 ENCOUNTER — Ambulatory Visit (INDEPENDENT_AMBULATORY_CARE_PROVIDER_SITE_OTHER): Payer: Medicare HMO | Admitting: Cardiovascular Disease

## 2019-04-16 ENCOUNTER — Telehealth: Payer: Self-pay

## 2019-04-16 VITALS — BP 164/70 | HR 50 | Ht 67.5 in | Wt 219.0 lb

## 2019-04-16 DIAGNOSIS — I5032 Chronic diastolic (congestive) heart failure: Secondary | ICD-10-CM | POA: Diagnosis not present

## 2019-04-16 DIAGNOSIS — I4819 Other persistent atrial fibrillation: Secondary | ICD-10-CM

## 2019-04-16 DIAGNOSIS — I480 Paroxysmal atrial fibrillation: Secondary | ICD-10-CM

## 2019-04-16 DIAGNOSIS — J432 Centrilobular emphysema: Secondary | ICD-10-CM

## 2019-04-16 DIAGNOSIS — I10 Essential (primary) hypertension: Secondary | ICD-10-CM

## 2019-04-16 DIAGNOSIS — I2510 Atherosclerotic heart disease of native coronary artery without angina pectoris: Secondary | ICD-10-CM

## 2019-04-16 DIAGNOSIS — I2584 Coronary atherosclerosis due to calcified coronary lesion: Secondary | ICD-10-CM | POA: Diagnosis not present

## 2019-04-16 DIAGNOSIS — I272 Pulmonary hypertension, unspecified: Secondary | ICD-10-CM | POA: Diagnosis not present

## 2019-04-16 MED ORDER — AMIODARONE HCL 200 MG PO TABS: 200.0000 mg | ORAL_TABLET | Freq: Every day | ORAL | 3 refills | Status: DC

## 2019-04-16 MED ORDER — POTASSIUM CHLORIDE ER 10 MEQ PO TBCR: 10.0000 meq | EXTENDED_RELEASE_TABLET | Freq: Every day | ORAL | 3 refills | Status: DC

## 2019-04-16 MED ORDER — BISOPROLOL FUMARATE 5 MG PO TABS: 5.0000 mg | ORAL_TABLET | Freq: Two times a day (BID) | ORAL | 6 refills | Status: DC

## 2019-04-16 MED ORDER — FUROSEMIDE 40 MG PO TABS: 40.0000 mg | ORAL_TABLET | ORAL | 3 refills | Status: DC

## 2019-04-16 NOTE — Telephone Encounter (Signed)
Patient here in office for appointment with provider. Results copied into progress note for today.

## 2019-04-16 NOTE — Patient Instructions (Addendum)
Medication Instructions:  Your physician has recommended you make the following change in your medication:  1. RESTART Amiodarone 200 mg once daily 2. STOP Metoprolol  3. START Bisoprolol 5 mg twice a day 4. TAKE Furosemide 40 mg once daily with extra 1 tablet (40 mg) in the evening as needed for ankle swelling or fluid retention.  5. TAKE Potassium 10 mEq once daily    If you need a refill on your cardiac medications before your next appointment, please call your pharmacy.    Lab work: No new labs needed   If you have labs (blood work) drawn today and your tests are completely normal, you will receive your results only by: [name] Kitchen MyChart Message (if you have MyChart) OR . A paper copy in the mail If you have any lab test that is abnormal or we need to change your treatment, we will call you to review the results.   Testing/Procedures: No new testing needed   Follow-Up: At [facility], you and your health needs are our priority.  As part of our continuing mission to provide you with exceptional heart care, we have created designated Provider Care Teams.  These Care Teams include your primary Cardiologist (physician) and Advanced Practice Providers (APPs -  Physician Assistants and Nurse Practitioners) who all work together to provide you with the care you need, when you need it.  . You will need a follow up appointment in 1 month  . Providers on your designated Care Team:   . Murray Hodgkins, NP . Christell Faith, PA-C . Marrianne Mood, PA-C  Any Other Special Instructions Will Be Listed Below (If Applicable).  For educational health videos Log in to : [email or web address] Or : [email or web address], password : [email or web address]

## 2019-04-16 NOTE — Progress Notes (Signed)
Notes recorded by Rise Mu, PA-C on 04/15/2019 at 12:33 PM EST  Renal function remains elevated, though stable.  Please have her hold Lasix and KCl for 24 hours then resume Lasix at lower dose 20 mg daily along with lower dose KCl 10 meq daily.  Recheck BMET in 2 weeks.

## 2019-04-16 NOTE — Telephone Encounter (Signed)
Call to patient to discuss results of labs and discuss POC.   Pt reports that she is on her way to appt now and has nothing to write with. She asked if she could discuss it further with Dr. Rockey Situ at visit.   Routing to [employer]

## 2019-04-16 NOTE — Telephone Encounter (Signed)
-----   Message from Rise Mu, PA-C sent at 04/15/2019 12:33 PM EST ----- Renal function remains elevated, though stable.  Please have her hold Lasix and KCl for 24 hours then resume Lasix at lower dose 20 mg daily along with lower dose KCl 10 meq daily.  Recheck BMET in 2 weeks.

## 2019-04-22 ENCOUNTER — Telehealth: Payer: Self-pay | Admitting: *Deleted

## 2019-04-22 NOTE — Telephone Encounter (Signed)
Patient is scheduled for lab study on 05/03/19. Pt is scheduled for COVID screening on 04/30/19 1:45 prior to SS. Patient understands her sleep study will be done at [facility] sleep lab. Patient understands she will receive a sleep packet in a week or so. Patient understands to call if she does not receive the sleep packet in a timely manner. Patient agrees with treatment and thanked me for call.

## 2019-04-22 NOTE — Telephone Encounter (Signed)
-----   Message from Lauralee Evener, [place] sent at 04/15/2019  9:50 AM EST ----- Regarding: RE: precert [employer] auth received. Ok to schedule sleep study. Josem Kaufmann [id number]. Valid dates 04/07/19 to 10/04/19. ----- Message ----- From: Freada Bergeron, CMA Sent: 04/04/2019   1:18 PM EST To: Cv Div Sleep Studies Subject: precert                                        Split night  Dx: Snoring, New onset Afib.

## 2019-04-30 ENCOUNTER — Other Ambulatory Visit (HOSPITAL_COMMUNITY)
Admission: RE | Admit: 2019-04-30 | Discharge: 2019-04-30 | Disposition: A | Discharge status: Home / Self Care | Payer: Medicare HMO | Source: Ambulatory Visit | Attending: Cardiology | Admitting: Cardiology

## 2019-04-30 DIAGNOSIS — Z20828 Contact with and (suspected) exposure to other viral communicable diseases: Secondary | ICD-10-CM | POA: Insufficient documentation

## 2019-04-30 DIAGNOSIS — Z01812 Encounter for preprocedural laboratory examination: Secondary | ICD-10-CM | POA: Insufficient documentation

## 2019-05-01 LAB — NOVEL CORONAVIRUS, NAA (HOSP ORDER, SEND-OUT TO REF LAB; TAT 18-24 HRS): SARS-CoV-2, NAA: NOT DETECTED

## 2019-05-03 ENCOUNTER — Ambulatory Visit (HOSPITAL_BASED_OUTPATIENT_CLINIC_OR_DEPARTMENT_OTHER): Payer: Medicare HMO | Attending: Cardiology | Admitting: Cardiology

## 2019-05-03 ENCOUNTER — Other Ambulatory Visit: Payer: Self-pay

## 2019-05-03 DIAGNOSIS — R0902 Hypoxemia: Secondary | ICD-10-CM | POA: Insufficient documentation

## 2019-05-03 DIAGNOSIS — G4733 Obstructive sleep apnea (adult) (pediatric): Secondary | ICD-10-CM | POA: Insufficient documentation

## 2019-05-03 DIAGNOSIS — I10 Essential (primary) hypertension: Secondary | ICD-10-CM | POA: Diagnosis not present

## 2019-05-03 DIAGNOSIS — G4700 Insomnia, unspecified: Secondary | ICD-10-CM | POA: Diagnosis present

## 2019-05-07 NOTE — Procedures (Signed)
   Patient Name: Lauren Lloyd, Lauren Lloyd Date: 05/03/2019 Gender: Female D.O.B: 1941-01-14 Age (years): 55 Referring Provider: Fransico Him MD, ABSM Height (inches): 68 Interpreting Physician: Fransico Him MD, ABSM Weight (lbs): 212 RPSGT: Earney [place] BMI: 33 MRN: [id number] Neck Size: 17.50  CLINICAL INFORMATION Sleep Study Type: NPSG  Indication for sleep study: N/A  Epworth Sleepiness Score: 5  SLEEP STUDY TECHNIQUE As per the AASM Manual for the Scoring of Sleep and Associated Events v2.3 (April 2016) with a hypopnea requiring 4% desaturations.  The channels recorded and monitored were frontal, central and occipital EEG, electrooculogram (EOG), submentalis EMG (chin), nasal and oral airflow, thoracic and abdominal wall motion, anterior tibialis EMG, snore microphone, electrocardiogram, and pulse oximetry.  MEDICATIONS Medications self-administered by patient taken the night of the study : N/A  SLEEP ARCHITECTURE The study was initiated at 11:17:41 PM and ended at 5:51:15 AM.  Sleep onset time was 14.9 minutes and the sleep efficiency was 83.0%. The total sleep time was 326.7 minutes.  Stage REM latency was 137.0 minutes.  The patient spent 11.3% of the night in stage N1 sleep, 78.3% in stage N2 sleep, 0.2% in stage N3 and 10.3% in REM.  Alpha intrusion was absent.  Supine sleep was 9.31%.  RESPIRATORY PARAMETERS The overall apnea/hypopnea index (AHI) was 11.0 per hour. There were 0 total apneas, including 0 obstructive, 0 central and 0 mixed apneas. There were 60 hypopneas and 47 RERAs.  The AHI during Stage REM sleep was 9.0 per hour.  AHI while supine was 53.3 per hour.  The mean oxygen saturation was 92.6%. The minimum SpO2 during sleep was 83.0%.  Moderate snoring was noted during this study.  CARDIAC DATA The 2 lead EKG demonstrated sinus rhythm. The mean heart rate was 78.8 beats per minute. Other EKG findings include: None.  LEG MOVEMENT DATA The  total PLMS were 0 with a resulting PLMS index of 0.0. Associated arousal with leg movement index was 4.6 .  IMPRESSIONS - Mild obstructive sleep apnea occurred during this study (AHI = 11.0/h). - No significant central sleep apnea occurred during this study (CAI = 0.0/h). - Mild oxygen desaturation was noted during this study (Min O2 = 83.0%). - The patient snored with moderate snoring volume. - No cardiac abnormalities were noted during this study. - Clinically significant periodic limb movements did not occur during sleep. No significant associated arousals.  DIAGNOSIS - Obstructive Sleep Apnea (327.23 [G47.33 ICD-10]) - Nocturnal Hypoxemia (327.26 [G47.36 ICD-10])  RECOMMENDATIONS - Therapeutic CPAP titration to determine optimal pressure required to alleviate sleep disordered breathing. - Positional therapy avoiding supine position during sleep. - Avoid alcohol, sedatives and other CNS depressants that may worsen sleep apnea and disrupt normal sleep architecture. - Sleep hygiene should be reviewed to assess factors that may improve sleep quality. - Weight management and regular exercise should be initiated or continued if appropriate.  Signed: Fransico Him, MD [facility] 05/07/2019

## 2019-05-08 ENCOUNTER — Telehealth: Payer: Self-pay | Admitting: *Deleted

## 2019-05-08 DIAGNOSIS — G4733 Obstructive sleep apnea (adult) (pediatric): Secondary | ICD-10-CM

## 2019-05-08 DIAGNOSIS — G4700 Insomnia, unspecified: Secondary | ICD-10-CM

## 2019-05-08 NOTE — Telephone Encounter (Signed)
Informed patient of sleep study results and patient understanding was verbalized. Patient understands her sleep study showed they have sleep apnea and recommend CPAP titration. Please set up titration in the sleep lab.  Pt is aware and agreeable to her results.  Titration sent to sleep pool 

## 2019-05-08 NOTE — Telephone Encounter (Signed)
PA submitted to [employer] for CPAP titration.

## 2019-05-08 NOTE — Telephone Encounter (Signed)
-----   Message from Lauren Margarita, MD sent at 05/07/2019  3:47 PM EST ----- Please let patient know that they have sleep apnea and recommend CPAP titration. Please set up titration in the sleep lab.

## 2019-05-16 ENCOUNTER — Other Ambulatory Visit: Payer: Self-pay | Admitting: Cardiovascular Disease

## 2019-05-16 MED ORDER — BISOPROLOL FUMARATE 5 MG PO TABS: 5.0000 mg | ORAL_TABLET | Freq: Two times a day (BID) | ORAL | 6 refills | Status: DC

## 2019-05-16 NOTE — Telephone Encounter (Signed)
*  STAT* If patient is at the pharmacy, call can be transferred to refill team.   1. Which medications need to be refilled? (please list name of each medication and dose if known) bisoprolol 5 MG - 1 tablet 2 times daily   2. Which pharmacy/location (including street and city if local pharmacy) is medication to be sent to? [facility]   3. Do they need a 30 day or 90 day supply? 30 day

## 2019-05-20 ENCOUNTER — Telehealth: Payer: Self-pay | Admitting: Family Medicine

## 2019-05-20 ENCOUNTER — Telehealth: Payer: Self-pay | Admitting: *Deleted

## 2019-05-20 DIAGNOSIS — J3489 Other specified disorders of nose and nasal sinuses: Secondary | ICD-10-CM

## 2019-05-20 NOTE — Telephone Encounter (Signed)
Referral placed.

## 2019-05-20 NOTE — Telephone Encounter (Signed)
Referral Request - Has patient seen PCP for this complaint? Yes.   *If NO, is insurance requiring patient see PCP for this issue before PCP can refer them? Referral for which specialty: ent  Preferred provider/office: [facility]  [place] ent  Reason for referral: scab inside nose that can not be cleared up   Pt has seen this ent before   cb is [phone]

## 2019-05-20 NOTE — Telephone Encounter (Signed)
Okay to place referral to ENT as requested by patient for nasal lesion

## 2019-05-20 NOTE — Telephone Encounter (Signed)
-----   Message from Freada Bergeron, [place] sent at 05/08/2019  3:02 PM EST ----- Regarding: precert recommend CPAP titration

## 2019-05-20 NOTE — Telephone Encounter (Signed)
Staff message sent to [place]ae [place] ok to schedule CPAP titration. [employer] received. Auth# [id number]. Valid dates 05/12/19 to 11/08/19.

## 2019-05-24 NOTE — Progress Notes (Signed)
Date:  05/27/2019   ID:  Pattsy, Smeaton 1940-12-10, MRN [id number]  Patient Location:  [address] [place] [address]   Provider location:   [facility], Ken Caryl offic  PCP:  [name], MD  Cardiologist:  Arvid Right [facility]  Chief Complaint  Patient presents with  . office visit    Pt 1 month f/u. Pt c/o SOB. Meds verbally reviewed w/ pt.    History of Present Illness:    Lauren Lloyd is a 79 y.o. female  past medical history of Aortic atherosclerosis on CT scan 2020 hypertension,  asthma  Chronic lower extremity swelling Hospital admission for atrial fibrillation December 2019 Associated acute diastolic CHF Status post successful DCCV on 06/15/2018.  NSR on 06/2018 stress test, 07/2018 Showing no significant ischemia Mild lower extremity swelling, chronic Who presents for hospital follow-up, follow-up of her atrial fibrillation and acute on chronic diastolic CHF  Nov [id number], developed atrial fib Seen in clinic 11/16 Started on amiodarone, higher dose metoprolol Lasix increased,40 BID 3 days then back to 40 daily She continued on 40 BID  She presented to the clinic and had stopped her amiodarone  She was still in normal sinus rhythm but she had worsening shortness of breath  She reported very high fluid intake  REDS vest 40% on 04/16/2019, goal less than 36  On that visit she was restarted on amiodarone, metoprolol held and started on bisoprolol  Weight down from 219 from the office beginning of December down to 212 mid December Back up to 222 today Unclear if she is checking weight at home  Takes lasix 40 daily, rarely takes extra Lasix after lunch Reports that she had a good day with her breathing yesterday but bad today (Atrial fibrillation?)  Still with cough with yellow, chronic issue Reports has been going on for 3 years  Reports that she had part 1 of sleep study, has no follow-up scheduled  EKG personally  reviewed by myself on todays visit Shows atrial fibrillation raet 90 bpm Changed from NSR last month  Other past medical history reviewed stress test, 07/2018 Showing no significant ischemia normal ejection fraction normal perfusion  Presented to the hospital 04/2018 with worsening shortness of breath, leg swelling, weight gain, abdominal bloating and new atrial fibrillation with RVR Initially on Cardizem infusion, changed to oral Cardizem with titration upwards Discharged on Cardizem CD 300 mg and metoprolol 25 mg bid, Eliquis. Treated with IV Lasix during her hospital course with marked improvement in her shortness of breath  Carotid ultrasound January 2020 Less than 39% disease bilaterally   Past Medical History:  Diagnosis Date  . Arthritis    knees, Hands  . GERD (gastroesophageal reflux disease)   . Persistent atrial fibrillation ([place])    a.  Diagnosed 12/19; b. CHADS2VASc => 6 (CHF, HTN, age x 2, vascular disease, female); c. Eliquis   Past Surgical History:  Procedure Laterality Date  . CARDIOVERSION N/A 06/15/2018   Procedure: CARDIOVERSION (CATH LAB);  Surgeon: Minna Merritts, MD;  Location: [facility] ORS;  Service: Cardiovascular;  Laterality: N/A;  . CATARACT EXTRACTION W/PHACO Right 01/25/2016   Procedure: CATARACT EXTRACTION PHACO AND INTRAOCULAR LENS PLACEMENT ([place]);  Surgeon: Ronnell Freshwater, MD;  Location: [facility];  Service: Ophthalmology;  Laterality: Right;  RIGHT  . CATARACT EXTRACTION W/PHACO Left 02/22/2016   Procedure: CATARACT EXTRACTION PHACO AND INTRAOCULAR LENS PLACEMENT ([facility]);  Surgeon: Ronnell Freshwater, MD;  Location: [facility];  Service: Ophthalmology;  Laterality: Left;  LEFT  . [place]  2010  . KNEE ARTHROSCOPY Right 2004  . REPLACEMENT TOTAL KNEE Right 2009   [facility]  . SKIN GRAFT Left 04/08/2013   Done on left index finger  . TONSILLECTOMY  1946     Current Meds  Medication  Sig  . bisoprolol (ZEBETA) 5 MG tablet Take 1 tablet (5 mg total) by mouth 2 (two) times daily.  [name] Kitchen HYDROcodone-acetaminophen (NORCO/VICODIN) 5-325 MG tablet Take 1 tablet by mouth every 6 (six) hours as needed for moderate pain.  . metaxalone (SKELAXIN) 800 MG tablet Take 1 tablet (800 mg total) by mouth 3 (three) times daily.  . Multiple Vitamin (MULTIVITAMIN) capsule Take 1 capsule by mouth daily.  . potassium chloride (KLOR-CON) 10 MEQ tablet Take 1 tablet (10 mEq total) by mouth daily.  . [DISCONTINUED] amiodarone (PACERONE) 200 MG tablet Take 1 tablet (200 mg total) by mouth daily.  . [DISCONTINUED] furosemide (LASIX) 40 MG tablet Take 1 tablet (40 mg total) by mouth as directed. Take 1 tablet (40 mg) daily with extra 1 tablet (40 mg) in evening for leg swelling or fluid retention.     Allergies:   Levofloxacin, Influenza vaccines, and Oysters [shellfish allergy]   Social History   Tobacco Use  . Smoking status: Former Smoker    Packs/day: 1.00    Years: 30.00    Pack years: 30.00    Types: Cigarettes    Quit date: 05/16/1989    Years since quitting: 30.0  . Smokeless tobacco: Never Used  Substance Use Topics  . Alcohol use: Yes    Alcohol/week: 14.0 standard drinks    Types: 7 Glasses of wine, 7 Standard drinks or equivalent per week  . Drug use: No     Current Outpatient Medications on File Prior to Visit  Medication Sig Dispense Refill  . bisoprolol (ZEBETA) 5 MG tablet Take 1 tablet (5 mg total) by mouth 2 (two) times daily. 60 tablet 6  . HYDROcodone-acetaminophen (NORCO/VICODIN) 5-325 MG tablet Take 1 tablet by mouth every 6 (six) hours as needed for moderate pain. 30 tablet 0  . metaxalone (SKELAXIN) 800 MG tablet Take 1 tablet (800 mg total) by mouth 3 (three) times daily. 90 tablet 1  . Multiple Vitamin (MULTIVITAMIN) capsule Take 1 capsule by mouth daily.    . potassium chloride (KLOR-CON) 10 MEQ tablet Take 1 tablet (10 mEq total) by mouth daily. 90 tablet 3  .  apixaban (ELIQUIS) 5 MG TABS tablet Take 1 tablet (5 mg total) by mouth 2 (two) times daily for 30 days. 60 tablet 11  . gabapentin (NEURONTIN) 300 MG capsule TAKE 1 CAPSULE AT BEDTIME (Patient not taking: Reported on 05/27/2019) 30 capsule 1  . loperamide (IMODIUM) 2 MG capsule Take 2 mg by mouth as needed.      No current facility-administered medications on file prior to visit.     Family Hx: [name] family history includes Atrial fibrillation in her sister and sister; Breast cancer (age of onset: 57) in her sister; Healthy in her brother; Heart attack in her father; Hyperlipidemia in her sister and sister; Transient ischemic attack in her mother.  ROS:   Please see the history of present illness.    Review of Systems  Constitutional: Negative.   HENT: Negative.   Respiratory: Positive for cough and shortness of breath.   Cardiovascular: Negative.   Gastrointestinal: Negative.   Musculoskeletal: Positive  for back pain and joint pain.  Neurological: Negative.   Psychiatric/Behavioral: Negative.   All other systems reviewed and are negative.    Labs/Other Tests and Data Reviewed:    Recent Labs: 04/01/2019: ALT 27; Hemoglobin 13.0; Platelets 274; TSH 1.193 04/15/2019: BUN 27; Creatinine, Ser 1.22; Potassium 3.9; Sodium 142   Recent Lipid Panel Lab Results  Component Value Date/Time   CHOL 180 05/31/2018 04:52 AM   CHOL 269 (H) 12/28/2016 08:23 AM   TRIG 107 05/31/2018 04:52 AM   HDL 50 05/31/2018 04:52 AM   HDL 67 12/28/2016 08:23 AM   CHOLHDL 3.6 05/31/2018 04:52 AM   LDLCALC 109 (H) 05/31/2018 04:52 AM   LDLCALC 169 (H) 12/28/2016 08:23 AM    Wt Readings from Last 3 Encounters:  05/27/19 222 lb (100.7 kg)  05/04/19 212 lb (96.2 kg)  04/16/19 219 lb (99.3 kg)     Exam:    BP 108/70 (BP Location: Left Arm, Patient Position: Sitting, Cuff Size: Normal)   Pulse 90   Ht 5' 7.5" (1.715 m)   Wt 222 lb (100.7 kg)   SpO2 96%   BMI 34.26 kg/m  Constitutional:   oriented to person, place, and time. No distress.  Obese HENT:  Head: Grossly normal Eyes:  no discharge. No scleral icterus.  Neck: No JVD, no carotid bruits  Cardiovascular: Regular rate and rhythm, no murmurs appreciated Pulmonary/Chest: Clear to auscultation bilaterally, no wheezes or rails Abdominal: Soft.  no distension.  no tenderness.  Musculoskeletal: Normal range of motion Neurological:  normal muscle tone. Coordination normal. No atrophy Skin: Skin warm and dry Psychiatric: normal affect, pleasant   ASSESSMENT & PLAN:    Paroxysmal atrial fibrillation (HCC) -  In atrial fibrillation Prior clinic visit 1 month ago was normal sinus rhythm Weight is up from 212 pounds up to 222 pounds High food and fluid intake Recommend she take Lasix 40 twice daily for weight 218 or higher -Amiodarone load to restore normal sinus rhythm Amiodarone twice daily for 5 days then 200 twice daily for 1 week then 200 daily -Need to confirm medication compliance.  Previously stopped amiodarone on her own -Referral to pulmonary for shortness of breath, sleep disorder, chronic cough with bronchitis/yellow sputum.  She prefers to go outside the [facility] system.  Prior smoking history.  Not use her inhalers regularly  Chronic diastolic CHF (congestive heart failure) ([place]) Reports she is compliant with Lasix 40 daily Last clinic visit with high fluid intake, moderation recommended Recommended Lasix 40 twice daily for weight 218 or higher Strongly recommended calorie restriction Reports that she ate everything that came her way through the holidays  Leg edema No significant edema on today's visit Reports that she feels bloated all over We will avoid calcium channel blockers  Smoker Referral to pulmonary  Centrilobular emphysema ([place])  Concern for bronchiectasis given chronic cough with yellow sputum past 3 years or so Referral to pulmonary  Pulmonary hypertension, unspecified (HCC) Moderate  pulmonary hypertension seen on prior echocardiogram Continue Lasix 40 daily Lasix 40 twice daily for weight 218 or higher Needs calorie restriction  Back pain, knee pain Feels that she needs knee replacement surgery but is putting it off Recommended weight loss   Total encounter time more than 45 minutes  Greater than 50% was spent in counseling and coordination of care with the patient   Disposition: Follow-up in 3 month   Signed, Ida Rogue, MD  05/27/2019 1:15 PM    [facility]  [facility] [address], [place], [place] [address]

## 2019-05-27 ENCOUNTER — Ambulatory Visit (INDEPENDENT_AMBULATORY_CARE_PROVIDER_SITE_OTHER): Payer: Medicare HMO | Admitting: Cardiovascular Disease

## 2019-05-27 ENCOUNTER — Telehealth: Payer: Self-pay | Admitting: *Deleted

## 2019-05-27 ENCOUNTER — Other Ambulatory Visit: Payer: Self-pay

## 2019-05-27 ENCOUNTER — Encounter: Payer: Self-pay | Admitting: Cardiovascular Disease

## 2019-05-27 VITALS — BP 108/70 | HR 90 | Ht 67.5 in | Wt 222.0 lb

## 2019-05-27 DIAGNOSIS — I2584 Coronary atherosclerosis due to calcified coronary lesion: Secondary | ICD-10-CM

## 2019-05-27 DIAGNOSIS — J432 Centrilobular emphysema: Secondary | ICD-10-CM | POA: Diagnosis not present

## 2019-05-27 DIAGNOSIS — I4819 Other persistent atrial fibrillation: Secondary | ICD-10-CM

## 2019-05-27 DIAGNOSIS — I10 Essential (primary) hypertension: Secondary | ICD-10-CM | POA: Diagnosis not present

## 2019-05-27 DIAGNOSIS — I2510 Atherosclerotic heart disease of native coronary artery without angina pectoris: Secondary | ICD-10-CM

## 2019-05-27 DIAGNOSIS — I5032 Chronic diastolic (congestive) heart failure: Secondary | ICD-10-CM | POA: Diagnosis not present

## 2019-05-27 DIAGNOSIS — I272 Pulmonary hypertension, unspecified: Secondary | ICD-10-CM | POA: Diagnosis not present

## 2019-05-27 MED ORDER — AMIODARONE HCL 200 MG PO TABS: ORAL_TABLET | ORAL | 0 refills | Status: DC

## 2019-05-27 MED ORDER — FUROSEMIDE 40 MG PO TABS: 40.0000 mg | ORAL_TABLET | Freq: Two times a day (BID) | ORAL | 3 refills | Status: DC

## 2019-05-27 NOTE — Telephone Encounter (Signed)
-----   Message from Lauralee Evener, Dana sent at 05/20/2019  8:54 AM EST ----- Regarding: RE: precert Authorization  received from [place]. Auth # [id number]. Valid 05/12/19 to 11/08/19. Ok to schedule. ----- Message ----- From: Freada Bergeron, CMA Sent: 05/08/2019   3:02 PM EST To: Cv Div Sleep Studies Subject: precert                                        recommend CPAP titration

## 2019-05-27 NOTE — Patient Instructions (Addendum)
Referral to pulmonary , Claudette Stapler at [facility] SOB, chronic bronchitis   Medication Instructions:  Take lasix 40 twice a day anytime your weight is 218 or higher   For atrial fibrillation, Take amiodarone 2 in the Am and 2 in the PM for 5 days Then down to 1 pill twice a day for 1 week Then down to 1 a day   If you need a refill on your cardiac medications before your next appointment, please call your pharmacy.    Lab work: No new labs needed   If you have labs (blood work) drawn today and your tests are completely normal, you will receive your results only by: [name] Kitchen MyChart Message (if you have MyChart) OR . A paper copy in the mail If you have any lab test that is abnormal or we need to change your treatment, we will call you to review the results.   Testing/Procedures: No new testing needed   Follow-Up: At [facility], you and your health needs are our priority.  As part of our continuing mission to provide you with exceptional heart care, we have created designated Provider Care Teams.  These Care Teams include your primary Cardiologist (physician) and Advanced Practice Providers (APPs -  Physician Assistants and Nurse Practitioners) who all work together to provide you with the care you need, when you need it.  . You will need a follow up appointment in 3 months   . Providers on your designated Care Team:   . Murray Hodgkins, NP . Christell Faith, PA-C . Marrianne Mood, PA-C  Any Other Special Instructions Will Be Listed Below (If Applicable).  For educational health videos Log in to : www.myemmi.com Or : [email or web address].at, password : [email or web address]   Daily Weight Record It is important to weigh yourself daily. To do this:  Make sure you use a reliable scale. Use the same scale each day.  Keep this daily weight chart near your scale.  Weigh yourself each morning at the same time.  Before weighing yourself: ? Take off your shoes. ? Make sure you are wearing the  same amount of clothing each day.  Write down your weight in the spaces on the form.  Compare today's weight to yesterday's weight.  Bring this form with you to your follow-up visits with your health care provider. Call your health care provider if you have concerns about your weight, including rapid weight gain or loss. Date: ________ Weight: ____________________ Date: ________ Weight: ____________________ Date: ________ Weight: ____________________ Date: ________ Weight: ____________________ Date: ________ Weight: ____________________ Date: ________ Weight: ____________________ Date: ________ Weight: ____________________ Date: ________ Weight: ____________________ Date: ________ Weight: ____________________ Date: ________ Weight: ____________________ Date: ________ Weight: ____________________ Date: ________ Weight: ____________________ Date: ________ Weight: ____________________ Date: ________ Weight: ____________________ Date: ________ Weight: ____________________ Date: ________ Weight: ____________________ Date: ________ Weight: ____________________ Date: ________ Weight: ____________________ Date: ________ Weight: ____________________ Date: ________ Weight: ____________________ Date: ________ Weight: ____________________ Date: ________ Weight: ____________________ Date: ________ Weight: ____________________ Date: ________ Weight: ____________________ Date: ________ Weight: ____________________ Date: ________ Weight: ____________________ Date: ________ Weight: ____________________ Date: ________ Weight: ____________________ Date: ________ Weight: ____________________ Date: ________ Weight: ____________________ Date: ________ Weight: ____________________ Date: ________ Weight: ____________________ Date: ________ Weight: ____________________ Date: ________ Weight: ____________________ Date: ________ Weight: ____________________ Date: ________ Weight: ____________________ Date: ________  Weight: ____________________ Date: ________ Weight: ____________________ Date: ________ Weight: ____________________ Date: ________ Weight: ____________________ Date: ________ Weight: ____________________ Date: ________ Weight: ____________________ Date: ________ Weight: ____________________ Date: ________ Weight: ____________________ Date: ________ Weight: ____________________ Date: ________  Weight: ____________________ Date: ________ Weight: ____________________ Date: ________ Weight: ____________________ Date: ________ Weight: ____________________ Date: ________ Weight: ____________________ This information is not intended to replace advice given to you by your health care provider. Make sure you discuss any questions you have with your health care provider. Document Revised: 05/01/2017 Document Reviewed: 05/01/2017 Elsevier Patient Education  2020 [employer].

## 2019-05-27 NOTE — Telephone Encounter (Signed)
Patient is scheduled for lab study on 06/10/19. Pt is scheduled for COVID screening on 06/07/19 11:30 prior to titration.   Patient understands her titration study will be done at [facility] sleep lab. Patient understands she will receive a sleep packet in a week or so. Patient understands to call if she does not receive the sleep packet in a timely manner.  Left detailed message on voicemail with date and time of titration and informed patient to call back to confirm or reschedule.

## 2019-05-29 ENCOUNTER — Other Ambulatory Visit: Payer: Self-pay | Admitting: Otolaryngology

## 2019-05-29 DIAGNOSIS — E041 Nontoxic single thyroid nodule: Secondary | ICD-10-CM | POA: Diagnosis not present

## 2019-05-29 DIAGNOSIS — J340 Abscess, furuncle and carbuncle of nose: Secondary | ICD-10-CM | POA: Diagnosis not present

## 2019-05-29 NOTE — Progress Notes (Signed)
Patient: Lauren Lloyd Female    DOB: Jun 28, 1940   79 y.o.   MRN: [id number] Visit Date: 05/30/2019  Today's Provider: Lavon Paganini, MD   Chief Complaint  Patient presents with  . Immunizations  . Referral   Subjective:    I Sulibeya S. Dimas, CMA, am acting as scribe for Lavon Paganini, MD.  Virtual Visit via Telephone Note  I connected with Lauren Lloyd on 05/30/19 at 10:20 AM EST by telephone and verified that I am speaking with the correct person using two identifiers.  Location: Patient location: home Provider location: [facility] Persons involved in the visit: patient, provider   I discussed the limitations, risks, security and privacy concerns of performing an evaluation and management service by telephone and the availability of in person appointments. I also discussed with the patient that there may be a patient responsible charge related to this service. The patient expressed understanding and agreed to proceed.    HPI Patient reports that in the past she has been allergic to flu vaccine with early Bells' palsy. No anaphylaxis.  Patient would like to know if she can take the COVID-19 vaccine.  Patient reports cardiologist has recommended to have a sonogram to follow a growth on her thyroid. Ultrasound has been scheduled for 06/05/2019 at [facility] outpatient imaging.  Patient reports persistent shortness of breath and wheezing. Patient reports cardiologist recommended her to follow up pulmonology. Patient reports an appt has been scheduled. Patient reports wanting to have CT scan of lung due to her smoking in the past.  Patient reports that ENT has scheduled for her to have sonogram done next on her thyroid.   Smoked 1ppd for 25 yrs and quit ~30 yrs ago.  Allergies  Allergen Reactions  . Levofloxacin     Other reaction(s): Joint Pains Other reaction(s): Other (See Comments) Joint pain  . Influenza Vaccines Other (See Comments)    Bell's  Palsy  . Oysters [Shellfish Allergy] Swelling    She states she had eaten them three days in a row and she developed swelling around her eyes.      Current Outpatient Medications:  .  amiodarone (PACERONE) 200 MG tablet, Take 2 tablets (400 mg total) by mouth 2 (two) times daily for 5 days, THEN 1 tablet (200 mg total) 2 (two) times daily for 7 days, THEN 1 tablet (200 mg total) daily., Disp: 70 tablet, Rfl: 0 .  apixaban (ELIQUIS) 5 MG TABS tablet, Take 1 tablet (5 mg total) by mouth 2 (two) times daily for 30 days., Disp: 60 tablet, Rfl: 11 .  bisoprolol (ZEBETA) 5 MG tablet, Take 1 tablet (5 mg total) by mouth 2 (two) times daily., Disp: 60 tablet, Rfl: 6 .  furosemide (LASIX) 40 MG tablet, Take 1 tablet (40 mg total) by mouth 2 (two) times daily. Take twice a day anytime weight is 218 or higher., Disp: 180 tablet, Rfl: 3 .  loperamide (IMODIUM) 2 MG capsule, Take 2 mg by mouth as needed. , Disp: , Rfl:  .  metaxalone (SKELAXIN) 800 MG tablet, Take 1 tablet (800 mg total) by mouth 3 (three) times daily., Disp: 90 tablet, Rfl: 1 .  Multiple Vitamin (MULTIVITAMIN) capsule, Take 1 capsule by mouth daily., Disp: , Rfl:  .  potassium chloride (KLOR-CON) 10 MEQ tablet, Take 1 tablet (10 mEq total) by mouth daily., Disp: 90 tablet, Rfl: 3  Review of Systems  Constitutional: Negative.   HENT: Negative.  Respiratory: Positive for cough, shortness of breath and wheezing. Negative for apnea, choking, chest tightness and stridor.   Cardiovascular: Negative.   Genitourinary: Negative.   Neurological: Negative.   Psychiatric/Behavioral: Negative.     Social History   Tobacco Use  . Smoking status: Former Smoker    Packs/day: 1.00    Years: 30.00    Pack years: 30.00    Types: Cigarettes    Quit date: 05/16/1989    Years since quitting: 30.0  . Smokeless tobacco: Never Used  Substance Use Topics  . Alcohol use: Yes    Alcohol/week: 14.0 standard drinks    Types: 7 Glasses of wine, 7  Standard drinks or equivalent per week      Objective:   Ht 5' 7.5" (1.715 m)   Wt 222 lb (100.7 kg)   BMI 34.26 kg/m  Vitals:   05/30/19 0915  Weight: 222 lb (100.7 kg)  Height: 5' 7.5" (1.715 m)  Body mass index is 34.26 kg/m.   Physical Exam Speaking in full sentences in NAD  No results found for any visits on 05/30/19.     Assessment & Plan    I discussed the assessment and treatment plan with the patient. The patient was provided an opportunity to ask questions and all were answered. The patient agreed with the plan and demonstrated an understanding of the instructions.   The patient was advised to call back or seek an in-person evaluation if the symptoms worsen or if the condition fails to improve as anticipated.  I provided 30 minutes of non-face-to-face time during this encounter, including  time personally spent on review of chart (labs and imaging), discussing labs and goals, discussing further work-up, treatment options, referrals to specialist if needed, reviewing outside records of pertinent, answering patient's questions, and coordinating care.   1. High priority for COVID-19 virus vaccination - discussed mechanism of COVID 19 vaccine and mRNA technology - discussed safety of vaccine - discussed that contraindications are h/o severe anphylactic reaction or known allergy to any of the vaccine's ingredients - as she had a non-anaphylactic reaction to flu vaccine in the past, she is not contraindicated - as the risk of COVID is greater than the risk of flu, the benefit of the vaccine outweighs the risk in my opinion  2. Chronic bronchitis with productive mucopurulent cough ([place]) 3. Dyspnea on exertion - longstanding issue with full Cardiac workup - was recently referred to [facility] by Cards and awaiting on appt - discussed that she will not need another referral - personally reviewed her CTA Chest/Abdomen from 10/2018 with the patient - we discussed that there were  no nodules or signs of lung cancer on this CT scan - no need to repeat CT at this time, but it may be considered after she sees Pulm - she likely has some COPD given her smoking history - Pulm may do PFTs, especially as she is taking amiodarone - will not start controller inhaler at this time, but this is likely with Pulm    [name], [name]. Portions of this information were initially documented by Lynford Humphrey , CMA and reviewed by me for thoroughness and accuracy.    , Dionne Bucy, MD MPH [facility]

## 2019-05-30 ENCOUNTER — Encounter: Payer: Self-pay | Admitting: Family Medicine

## 2019-05-30 ENCOUNTER — Ambulatory Visit (INDEPENDENT_AMBULATORY_CARE_PROVIDER_SITE_OTHER): Payer: Medicare HMO | Admitting: Family Medicine

## 2019-05-30 VITALS — Ht 67.5 in | Wt 222.0 lb

## 2019-05-30 DIAGNOSIS — R0609 Other forms of dyspnea: Secondary | ICD-10-CM

## 2019-05-30 DIAGNOSIS — R0600 Dyspnea, unspecified: Secondary | ICD-10-CM

## 2019-05-30 DIAGNOSIS — J411 Mucopurulent chronic bronchitis: Secondary | ICD-10-CM | POA: Diagnosis not present

## 2019-05-30 DIAGNOSIS — Z23 Encounter for immunization: Secondary | ICD-10-CM

## 2019-06-05 ENCOUNTER — Ambulatory Visit
Admission: RE | Admit: 2019-06-05 | Discharge: 2019-06-05 | Disposition: A | Discharge status: Home / Self Care | Payer: Medicare HMO | Source: Ambulatory Visit | Attending: Otolaryngology | Admitting: Otolaryngology

## 2019-06-05 ENCOUNTER — Other Ambulatory Visit: Payer: Self-pay

## 2019-06-05 DIAGNOSIS — E041 Nontoxic single thyroid nodule: Secondary | ICD-10-CM | POA: Diagnosis not present

## 2019-06-05 DIAGNOSIS — E042 Nontoxic multinodular goiter: Secondary | ICD-10-CM | POA: Diagnosis not present

## 2019-06-07 ENCOUNTER — Other Ambulatory Visit (HOSPITAL_COMMUNITY)
Admission: RE | Admit: 2019-06-07 | Discharge: 2019-06-07 | Disposition: A | Discharge status: Home / Self Care | Payer: Medicare HMO | Source: Ambulatory Visit | Attending: Cardiology | Admitting: Cardiology

## 2019-06-07 ENCOUNTER — Other Ambulatory Visit (HOSPITAL_COMMUNITY): Payer: Medicare HMO

## 2019-06-07 DIAGNOSIS — Z20822 Contact with and (suspected) exposure to covid-19: Secondary | ICD-10-CM | POA: Insufficient documentation

## 2019-06-07 DIAGNOSIS — Z01812 Encounter for preprocedural laboratory examination: Secondary | ICD-10-CM | POA: Diagnosis not present

## 2019-06-07 LAB — SARS CORONAVIRUS 2 (TAT 6-24 HRS): SARS Coronavirus 2: NEGATIVE

## 2019-06-10 ENCOUNTER — Other Ambulatory Visit: Payer: Self-pay

## 2019-06-10 ENCOUNTER — Ambulatory Visit (HOSPITAL_BASED_OUTPATIENT_CLINIC_OR_DEPARTMENT_OTHER): Payer: Medicare HMO | Attending: Cardiology | Admitting: Cardiology

## 2019-06-10 DIAGNOSIS — G4733 Obstructive sleep apnea (adult) (pediatric): Secondary | ICD-10-CM | POA: Insufficient documentation

## 2019-06-10 DIAGNOSIS — G4700 Insomnia, unspecified: Secondary | ICD-10-CM | POA: Diagnosis not present

## 2019-06-11 ENCOUNTER — Telehealth: Payer: Self-pay | Admitting: *Deleted

## 2019-06-11 NOTE — Telephone Encounter (Addendum)
Informed patient of titration results and verbalized understanding was indicated. Patient understands her titration study showed they had a successful PAP titration and let DME know that orders are in EPIC. Please set up 10 week OV with me.   Upon patient request DME selection is [facility]. Patient understands she will be contacted by [facility] to set up her cpap. Patient understands to call if [facility] does not contact her with new setup in a timely manner. Patient understands they will be called once confirmation has been received from [employer] that they have received their new machine to schedule 10 week follow up appointment.  [facility] notified of new cpap order  Please add to airview Patient was grateful for the call and thanked me.

## 2019-06-11 NOTE — Telephone Encounter (Signed)
-----   Message from Sueanne Margarita, MD sent at 06/11/2019 12:45 PM EST ----- Please let patient know that they had a successful PAP titration and let DME know that orders are in EPIC.  Please set up 10 week OV with me.

## 2019-06-11 NOTE — Procedures (Signed)
   Patient Name: Lauren Lloyd, Lauren Lloyd Date: 06/10/2019 Gender: Female D.O.B: 11/17/1940 Age (years): 75 Referring Provider: Fransico Him MD, ABSM Height (inches): 68 Interpreting Physician: Fransico Him MD, ABSM Weight (lbs): 212 RPSGT: Gwenyth [name] BMI: 33 MRN: [id number] Neck Size: 17.50  CLINICAL INFORMATION The patient is referred for a CPAP titration to treat sleep apnea.  SLEEP STUDY TECHNIQUE As per the AASM Manual for the Scoring of Sleep and Associated Events v2.3 (April 2016) with a hypopnea requiring 4% desaturations.  The channels recorded and monitored were frontal, central and occipital EEG, electrooculogram (EOG), submentalis EMG (chin), nasal and oral airflow, thoracic and abdominal wall motion, anterior tibialis EMG, snore microphone, electrocardiogram, and pulse oximetry. Continuous positive airway pressure (CPAP) was initiated at the beginning of the study and titrated to treat sleep-disordered breathing.  MEDICATIONS Medications self-administered by patient taken the night of the study : N/A  TECHNICIAN COMMENTS Comments added by technician: Patient was restless all through the night. Comments added by scorer: N/A   RESPIRATORY PARAMETERS Optimal PAP Pressure (cm): 16  HI at Optimal Pressure (/hr): 0.0 Overall Minimal O2 (%):92.0  upine % at Optimal Pressure (%): 0 Minimal O2 at Optimal Pressure (%): 95.0   SLEEP ARCHITECTURE The study was initiated at 9:34:43 PM and ended at 4:12:28 AM.  Sleep onset time was 7.9 minutes and the sleep efficiency was 77.9%. The total sleep time was 310 minutes.  The patient spent 6.6% of the night in stage N1 sleep, 70.0% in stage N2 sleep, 0.0% in stage N3 and 23.4% in REM.Stage REM latency was 155.0 minutes  Wake after sleep onset was 79.9. Alpha intrusion was absent. Supine sleep was 15.16%.  CARDIAC DATA The 2 lead EKG demonstrated sinus rhythm. The mean heart rate was 44.3 beats per minute. Other EKG findings  include: None.  LEG MOVEMENT DATA The total Periodic Limb Movements of Sleep (PLMS) were 0. The PLMS index was 0.0. A PLMS index of <15 is considered normal in adults.  IMPRESSIONS - The optimal PAP pressure was 16 cm of water. - Central sleep apnea was not noted during this titration (CAI = 0.0/h). - Significant oxygen desaturations were not observed during this titration (min O2 = 92.0%). - The patient snored with soft snoring volume during this titration study. - No cardiac abnormalities were observed during this study. - Clinically significant periodic limb movements were not noted during this study. Arousals associated with PLMs were rare.  DIAGNOSIS - Obstructive Sleep Apnea (327.23 [G47.33 ICD-10])  RECOMMENDATIONS - Trial of CPAP therapy on 16 cm H2O with a Small size Fisher&Paykel Full Face Mask Simplus mask and heated humidification. - Avoid alcohol, sedatives and other CNS depressants that may worsen sleep apnea and disrupt normal sleep architecture. - Sleep hygiene should be reviewed to assess factors that may improve sleep quality. - Weight management and regular exercise should be initiated or continued. - Return to [facility] for re-evaluation after 10 weeks of therapy  [Electronically signed] 06/11/2019 12:43 PM  Fransico Him MD, ABSM Diplomate, American Board of Sleep Medicine

## 2019-06-17 DIAGNOSIS — I272 Pulmonary hypertension, unspecified: Secondary | ICD-10-CM | POA: Diagnosis not present

## 2019-06-27 DIAGNOSIS — R69 Illness, unspecified: Secondary | ICD-10-CM | POA: Diagnosis not present

## 2019-07-01 ENCOUNTER — Ambulatory Visit
Admission: RE | Admit: 2019-07-01 | Discharge: 2019-07-01 | Disposition: A | Discharge status: Home / Self Care | Payer: Medicare HMO | Source: Ambulatory Visit | Attending: Family Medicine | Admitting: Family Medicine

## 2019-07-01 DIAGNOSIS — Z1231 Encounter for screening mammogram for malignant neoplasm of breast: Secondary | ICD-10-CM | POA: Diagnosis not present

## 2019-07-02 ENCOUNTER — Telehealth: Payer: Self-pay

## 2019-07-02 NOTE — Telephone Encounter (Signed)
Pt advised.   Thanks,   -Shontavia Mickel  

## 2019-07-02 NOTE — Telephone Encounter (Signed)
-----   Message from [name], MD sent at 07/02/2019  9:56 AM EST ----- Normal mammogram. Repeat in 1 yr

## 2019-07-08 DIAGNOSIS — G4733 Obstructive sleep apnea (adult) (pediatric): Secondary | ICD-10-CM | POA: Diagnosis not present

## 2019-07-11 ENCOUNTER — Ambulatory Visit: Payer: Medicare HMO | Admitting: Family Medicine

## 2019-07-15 ENCOUNTER — Ambulatory Visit (INDEPENDENT_AMBULATORY_CARE_PROVIDER_SITE_OTHER): Payer: Medicare HMO | Admitting: Family Medicine

## 2019-07-15 ENCOUNTER — Encounter: Payer: Self-pay | Admitting: Family Medicine

## 2019-07-15 ENCOUNTER — Other Ambulatory Visit: Payer: Self-pay

## 2019-07-15 VITALS — BP 124/58 | HR 42 | Temp 96.2°F | Resp 16 | Ht 67.5 in | Wt 217.6 lb

## 2019-07-15 DIAGNOSIS — R5382 Chronic fatigue, unspecified: Secondary | ICD-10-CM

## 2019-07-15 DIAGNOSIS — E7800 Pure hypercholesterolemia, unspecified: Secondary | ICD-10-CM

## 2019-07-15 DIAGNOSIS — I5032 Chronic diastolic (congestive) heart failure: Secondary | ICD-10-CM | POA: Diagnosis not present

## 2019-07-15 DIAGNOSIS — I4819 Other persistent atrial fibrillation: Secondary | ICD-10-CM

## 2019-07-15 DIAGNOSIS — G4733 Obstructive sleep apnea (adult) (pediatric): Secondary | ICD-10-CM | POA: Diagnosis not present

## 2019-07-15 DIAGNOSIS — J432 Centrilobular emphysema: Secondary | ICD-10-CM

## 2019-07-15 DIAGNOSIS — I272 Pulmonary hypertension, unspecified: Secondary | ICD-10-CM

## 2019-07-15 DIAGNOSIS — R0600 Dyspnea, unspecified: Secondary | ICD-10-CM

## 2019-07-15 DIAGNOSIS — R0609 Other forms of dyspnea: Secondary | ICD-10-CM

## 2019-07-15 DIAGNOSIS — I10 Essential (primary) hypertension: Secondary | ICD-10-CM | POA: Diagnosis not present

## 2019-07-15 NOTE — Assessment & Plan Note (Signed)
Recent diagnosis Mild, with AHI of 11 She is having a hard time adjusting to sleeping with the mask She will discuss with her distributor whether she could try a different mask, such as nasal pillows Discussed the importance of managing OSA

## 2019-07-15 NOTE — Assessment & Plan Note (Signed)
Well controlled Continue current medications Recheck metabolic panel F/u in 6 months  

## 2019-07-15 NOTE — Assessment & Plan Note (Signed)
May be contributing to her dyspnea on exertion Followed by cardiology and pulmonology

## 2019-07-15 NOTE — Assessment & Plan Note (Signed)
Followed by cardiology Euvolemic today Continue Lasix, beta-blocker at current doses Recheck metabolic panel

## 2019-07-15 NOTE — Assessment & Plan Note (Signed)
Followed by cardiology Rate and rhythm controlled, currently in normal sinus rhythm Continue beta-blocker, amiodarone, Eliquis Recent CBC without anemia

## 2019-07-15 NOTE — Progress Notes (Signed)
Patient: Lauren Lloyd Female    DOB: Jun 16, 1940   79 y.o.   MRN: [id number] Visit Date: 07/15/2019  Today's Provider: Lavon Paganini, MD   Chief Complaint  Patient presents with  . Hypertension   Subjective:    I Lauren Lloyd, CMA, am acting as scribe for Lavon Paganini, MD.  HPI  Hypertension, follow-up:  BP Readings from Last 3 Encounters:  07/15/19 (!) 124/58  05/27/19 108/70  04/16/19 (!) 164/70    She was last seen for hypertension 6 months ago.  BP at that visit was 134/74. Management changes since that visit include no changes. She reports excellent compliance with treatment. She is not having side effects.  She is not exercising. She is adherent to low salt diet.   Outside blood pressures are stable. She is experiencing dyspnea and fatigue.  Patient denies chest pain.   Cardiovascular risk factors include advanced age (older than 17 for men, 22 for women), hypertension and obesity (BMI >= 30 kg/m2).  Use of agents associated with hypertension: amphetamines.     Weight trend: stable Wt Readings from Last 3 Encounters:  07/15/19 217 lb 9.6 oz (98.7 kg)  06/10/19 210 lb (95.3 kg)  05/30/19 222 lb (100.7 kg)   Current diet: in general, a "healthy" diet   ------------------------------------------------------------------------  Lipid/Cholesterol, Follow-up:   Last seen for this6 months ago.  Management changes since that visit include no changes. Last Lipid Panel:    Component Value Date/Time   CHOL 180 05/31/2018 0452   CHOL 269 (H) 12/28/2016 0823   TRIG 107 05/31/2018 0452   HDL 50 05/31/2018 0452   HDL 67 12/28/2016 0823   CHOLHDL 3.6 05/31/2018 0452   VLDL 21 05/31/2018 0452   LDLCALC 109 (H) 05/31/2018 0452   LDLCALC 169 (H) 12/28/2016 [id number]    Risk factors for vascular disease include hypertension  She reports excellent compliance with treatment. She is not having side effects.  Current symptoms include none and have been  stable. Weight trend: stable Prior visit with dietician: no Current diet: in general, a "healthy" diet   Current exercise: none  Wt Readings from Last 3 Encounters:  07/15/19 217 lb 9.6 oz (98.7 kg)  06/10/19 210 lb (95.3 kg)  05/30/19 222 lb (100.7 kg)    -------------------------------------------------------------------  She continues to experience DOE and fatigue.  Occasional wheezing.  Was referred to pulmonology by cardiology.  She has received her CPAP late last week and is starting to use it.  She is having a hard time adjusting and has been unable to use it for long periods of time.  At her last visit with pulmonology they recommended right heart cath with cardiology.  She is following up in the next 2 months with cardiology and pulmonology.  Allergies  Allergen Reactions  . Levofloxacin     Other reaction(s): Joint Pains Other reaction(s): Other (See Comments) Joint pain  . Influenza Vaccines Other (See Comments)    Bell's Palsy  . Oysters [Shellfish Allergy] Swelling    She states she had eaten them three days in a row and she developed swelling around her eyes.      Current Outpatient Medications:  .  amiodarone (PACERONE) 200 MG tablet, Take 2 tablets (400 mg total) by mouth 2 (two) times daily for 5 days, THEN 1 tablet (200 mg total) 2 (two) times daily for 7 days, THEN 1 tablet (200 mg total) daily., Disp: 70 tablet, Rfl: 0 .  apixaban (  ELIQUIS) 5 MG TABS tablet, Take 1 tablet (5 mg total) by mouth 2 (two) times daily for 30 days., Disp: 60 tablet, Rfl: 11 .  bisoprolol (ZEBETA) 5 MG tablet, Take 1 tablet (5 mg total) by mouth 2 (two) times daily., Disp: 60 tablet, Rfl: 6 .  furosemide (LASIX) 40 MG tablet, Take 1 tablet (40 mg total) by mouth 2 (two) times daily. Take twice a day anytime weight is 218 or higher. (Patient taking differently: Take 40 mg by mouth daily. Take twice a day anytime weight is 218 or higher.), Disp: 180 tablet, Rfl: 3 .  loperamide (IMODIUM)  2 MG capsule, Take 2 mg by mouth as needed. , Disp: , Rfl:  .  metaxalone (SKELAXIN) 800 MG tablet, Take 1 tablet (800 mg total) by mouth 3 (three) times daily., Disp: 90 tablet, Rfl: 1 .  Multiple Vitamin (MULTIVITAMIN) capsule, Take 1 capsule by mouth daily., Disp: , Rfl:  .  potassium chloride (KLOR-CON) 10 MEQ tablet, Take 1 tablet (10 mEq total) by mouth daily., Disp: 90 tablet, Rfl: 3  Review of Systems  Constitutional: Positive for fatigue.  Respiratory: Positive for cough and shortness of breath. Negative for wheezing and stridor.   Cardiovascular: Negative for chest pain, palpitations and leg swelling.    Social History   Tobacco Use  . Smoking status: Former Smoker    Packs/day: 1.00    Years: 30.00    Pack years: 30.00    Types: Cigarettes    Quit date: 05/16/1989    Years since quitting: 30.1  . Smokeless tobacco: Never Used  Substance Use Topics  . Alcohol use: Yes    Alcohol/week: 14.0 standard drinks    Types: 7 Glasses of wine, 7 Standard drinks or equivalent per week      Objective:   BP (!) 124/58 (BP Location: Left Arm, Patient Position: Sitting, Cuff Size: Normal)   Pulse (!) 42   Temp (!) 96.2 F (35.7 C) (Temporal)   Resp 16   Ht 5' 7.5" (1.715 m)   Wt 217 lb 9.6 oz (98.7 kg)   SpO2 93%   BMI 33.58 kg/m  Vitals:   07/15/19 0857  BP: (!) 124/58  Pulse: (!) 42  Resp: 16  Temp: (!) 96.2 F (35.7 C)  TempSrc: Temporal  SpO2: 93%  Weight: 217 lb 9.6 oz (98.7 kg)  Height: 5' 7.5" (1.715 m)  Body mass index is 33.58 kg/m.   Physical Exam Vitals reviewed.  Constitutional:      General: She is not in acute distress.    Appearance: Normal appearance. She is well-developed. She is not diaphoretic.  HENT:     Head: Normocephalic and atraumatic.  Eyes:     General: No scleral icterus.    Conjunctiva/sclera: Conjunctivae normal.  Neck:     Thyroid: No thyromegaly.  Cardiovascular:     Rate and Rhythm: Normal rate and regular rhythm.      Pulses: Normal pulses.     Heart sounds: Normal heart sounds. No murmur.  Pulmonary:     Effort: Pulmonary effort is normal. No respiratory distress.     Breath sounds: Normal breath sounds. No wheezing, rhonchi or rales.  Abdominal:     General: There is no distension.     Palpations: Abdomen is soft.     Tenderness: There is no abdominal tenderness.  Musculoskeletal:     Cervical back: Neck supple.     Right lower leg: No edema.  Left lower leg: No edema.  Lymphadenopathy:     Cervical: No cervical adenopathy.  Skin:    General: Skin is warm and dry.     Findings: No rash.  Neurological:     Mental Status: She is alert and oriented to person, place, and time. Mental status is at baseline.  Psychiatric:        Mood and Affect: Mood normal.        Behavior: Behavior normal.      No results found for any visits on 07/15/19.     Assessment & Plan    Problem List Items Addressed This Visit      Cardiovascular and Mediastinum   Pulmonary hypertension, unspecified ([place])    May be contributing to her dyspnea on exertion Followed by cardiology and pulmonology      Hypertension - Primary    Well controlled Continue current medications Recheck metabolic panel F/u in 6 months      Relevant Orders   Comprehensive metabolic panel   Chronic diastolic CHF (congestive heart failure) ([place])    Followed by cardiology Euvolemic today Continue Lasix, beta-blocker at current doses Recheck metabolic panel      Persistent atrial fibrillation ([facility])    Followed by cardiology Rate and rhythm controlled, currently in normal sinus rhythm Continue beta-blocker, amiodarone, Eliquis Recent CBC without anemia        Respiratory   OSA (obstructive sleep apnea)    Recent diagnosis Mild, with AHI of 11 She is having a hard time adjusting to sleeping with the mask She will discuss with her distributor whether she could try a different mask, such as nasal pillows Discussed the  importance of managing OSA      Centrilobular emphysema ([place])    She was noted to have emphysema on previous imaging She continues to have shortness of breath and cough She does have a significant smoking history We have tried Spiriva previously, but she is no longer on this She is now followed by pulmonology Could consider controller medicine again in the future Advised her to carry her albuterol and try it when she is having dyspnea with exertion, such as when walking into a store from the parking lot        Other   Hypercholesteremia    Previously well controlled Continue current medications Recheck CMP and FLP      Relevant Orders   Lipid Panel With LDL/HDL Ratio   Comprehensive metabolic panel   Dyspnea on exertion    Ongoing issue We discussed that she has many factors that may contribute to this, such as CHF, A. fib, emphysema, pulmonary hypertension, as well as decreased exercise in the setting of the current pandemic She is being followed by cardiology and pulmonology Given that she also has fatigue, we will check vitamin B-12 and vitamin D As above, would consider controller medication for emphysema again in the future, but defer to pulmonology Is recommended by pulmonology for her to have a right heart cath with cardiology as well      Relevant Orders   VITAMIN D 25 Hydroxy (Vit-D Deficiency, Fractures)   B12    Other Visit Diagnoses    Chronic fatigue       Relevant Orders   VITAMIN D 25 Hydroxy (Vit-D Deficiency, Fractures)   B12       Return in about 6 months (around 01/15/2020) for CPE/AWV.   [name], Review  of Systems and Physical Exam were personally obtained by me. Portions of this information were initially documented by Lynford Humphrey, CMA and reviewed by me for thoroughness and accuracy.    Amilio Zehnder, Dionne Bucy, MD MPH [facility]

## 2019-07-15 NOTE — Assessment & Plan Note (Signed)
She was noted to have emphysema on previous imaging She continues to have shortness of breath and cough She does have a significant smoking history We have tried Spiriva previously, but she is no longer on this She is now followed by pulmonology Could consider controller medicine again in the future Advised her to carry her albuterol and try it when she is having dyspnea with exertion, such as when walking into a store from the parking lot

## 2019-07-15 NOTE — Assessment & Plan Note (Signed)
Previously well controlled Continue current medications Recheck CMP and FLP

## 2019-07-15 NOTE — Assessment & Plan Note (Signed)
Ongoing issue We discussed that she has many factors that may contribute to this, such as CHF, A. fib, emphysema, pulmonary hypertension, as well as decreased exercise in the setting of the current pandemic She is being followed by cardiology and pulmonology Given that she also has fatigue, we will check vitamin B-12 and vitamin D As above, would consider controller medication for emphysema again in the future, but defer to pulmonology Is recommended by pulmonology for her to have a right heart cath with cardiology as well

## 2019-07-16 LAB — COMPREHENSIVE METABOLIC PANEL
ALT: 13 IU/L (ref 0–32)
AST: 14 IU/L (ref 0–40)
Albumin/Globulin Ratio: 2 (ref 1.2–2.2)
Albumin: 4.1 g/dL (ref 3.7–4.7)
Alkaline Phosphatase: 105 IU/L (ref 39–117)
BUN/Creatinine Ratio: 20 (ref 12–28)
BUN: 20 mg/dL (ref 8–27)
Bilirubin Total: 0.4 mg/dL (ref 0.0–1.2)
CO2: 22 mmol/L (ref 20–29)
Calcium: 9.4 mg/dL (ref 8.7–10.3)
Chloride: 102 mmol/L (ref 96–106)
Creatinine, Ser: 1.02 mg/dL — ABNORMAL HIGH (ref 0.57–1.00)
GFR calc Af Amer: 60 mL/min/{1.73_m2} (ref >59)
GFR calc non Af Amer: 52 mL/min/{1.73_m2} — ABNORMAL LOW (ref >59)
Globulin, Total: 2.1 g/dL (ref 1.5–4.5)
Glucose: 96 mg/dL (ref 65–99)
Potassium: 4.4 mmol/L (ref 3.5–5.2)
Sodium: 141 mmol/L (ref 134–144)
Total Protein: 6.2 g/dL (ref 6.0–8.5)

## 2019-07-16 LAB — LIPID PANEL WITH LDL/HDL RATIO
Cholesterol, Total: 257 mg/dL — ABNORMAL HIGH (ref 100–199)
HDL: 66 mg/dL (ref >39)
LDL Chol Calc (NIH): 163 mg/dL — ABNORMAL HIGH (ref 0–99)
LDL/HDL Ratio: 2.5 ratio (ref 0.0–3.2)
Triglycerides: 158 mg/dL — ABNORMAL HIGH (ref 0–149)
VLDL Cholesterol Cal: 28 mg/dL (ref 5–40)

## 2019-07-16 LAB — VITAMIN B12: Vitamin B-12: 382 pg/mL (ref 232–1245)

## 2019-07-16 LAB — VITAMIN D 25 HYDROXY (VIT D DEFICIENCY, FRACTURES): Vit D, 25-Hydroxy: 21.4 ng/mL — ABNORMAL LOW (ref 30.0–100.0)

## 2019-07-19 ENCOUNTER — Telehealth: Payer: Self-pay

## 2019-07-19 NOTE — Telephone Encounter (Signed)
-----   Message from [name], MD sent at 07/19/2019 12:47 PM EST ----- Normal labs, except low vitamin D level.  Is she taking a supplement?  I recommend 1000 to 2000 units of vitamin D3 daily.  Cholesterol is high.  10 yr risk of heart disease/stroke is very high at 28.3%.  She really needs to be taking a statin

## 2019-07-19 NOTE — Telephone Encounter (Signed)
LMTCB 07/19/2019.  PEC please advise pt of labs below.   Thanks,   -Mickel Baas

## 2019-07-23 NOTE — Telephone Encounter (Signed)
Noted  

## 2019-07-23 NOTE — Telephone Encounter (Signed)
Patient advised as below. Patient reports she will start OTC vitamin D3 daily. Patient refuses to start a statin, reports that in the past she took medication and caused her a lot of muscle pain. Patient reports that she will try to diet and exercise first. I did advise patient that there are several medications to choose from. Patient agreed to call back if she is not able to continue with diet and exercise.

## 2019-07-31 ENCOUNTER — Telehealth: Payer: Self-pay | Admitting: Cardiovascular Disease

## 2019-07-31 NOTE — Telephone Encounter (Signed)
Amiodarone can cause skin photosensitivity. If she was working outside over the weekend and her skin was exposed to the sun, her spots may have come from the amiodarone (or potentially another allergen or irritant outside).

## 2019-07-31 NOTE — Telephone Encounter (Signed)
Would recommend she talk with primary care concerning etiology of her skin issues Lots of different things can contribute

## 2019-07-31 NOTE — Telephone Encounter (Signed)
Left detailed voicemail message that Dr. Rockey Situ wanted her primary care provider to assess etiology of the problem and to please call back if she has any further questions.

## 2019-07-31 NOTE — Telephone Encounter (Signed)
Spoke with patient and she states that she has these small red dots all the way to her knees with no itching or discomfort. She reports that the only thing new was starting a B 12 by her primary care provider. She states that multiple friends and family have developed these with their atrial fibrillation and certain medications. She wants to stop whatever is causing this because it is permanent and does not want it to spread. Advised to call her PCP office so they could possibly look at that and determine if it is from the B 12 and that I would send to our pharmacy team to review her cardiac medications as well. She was agreeable with this plan and let her know that I would also alert provider. She was thankful for the review and has no further questions at this time.

## 2019-07-31 NOTE — Telephone Encounter (Signed)
Spoke with patient and reviewed provider recommendations to have PCP assess. She had canceled the appointment thinking that it was wasted but will call them back to reschedule. I did mention that some Eucerin cream to that may help some but to please see her PCP to assess. She verbalized understanding with no further questions at this time.

## 2019-07-31 NOTE — Telephone Encounter (Signed)
Pt c/o medication issue:  1. Name of Medication: unsure, added a B12 tablet on 3/1  2. How are you currently taking this medication (dosage and times per day)? Taking all as prescribed   3. Are you having a reaction (difficulty breathing--STAT)? Red splotchy spots on arms, wrist and feet until where a sock stops. Spots were noticed on Monday.  4. What is your medication issue? N/a  Not sure if it is one medication or a combo. Patient states she worked outside this weekend but does not feel it came from there

## 2019-07-31 NOTE — Telephone Encounter (Signed)
Spoke with patient and reviewed pharmacy review and she does not want to stay on this medication. She has upcoming appointment and reviewed that she should not stop that abruptly until we talk with Dr. Rockey Situ. Let her know that I would send him message to make him aware of her wishes to stop that medication and switch to something else. Advised that I am not aware of comparable medication but that I would let provider review for recommendations.

## 2019-08-01 ENCOUNTER — Other Ambulatory Visit: Payer: Self-pay

## 2019-08-01 ENCOUNTER — Ambulatory Visit: Payer: Medicare HMO | Admitting: Family Medicine

## 2019-08-01 ENCOUNTER — Ambulatory Visit (INDEPENDENT_AMBULATORY_CARE_PROVIDER_SITE_OTHER): Payer: Medicare HMO | Admitting: Family Medicine

## 2019-08-01 ENCOUNTER — Encounter: Payer: Self-pay | Admitting: Family Medicine

## 2019-08-01 VITALS — BP 132/58 | HR 52 | Temp 95.9°F | Wt 216.0 lb

## 2019-08-01 DIAGNOSIS — R001 Bradycardia, unspecified: Secondary | ICD-10-CM | POA: Diagnosis not present

## 2019-08-01 DIAGNOSIS — L568 Other specified acute skin changes due to ultraviolet radiation: Secondary | ICD-10-CM | POA: Diagnosis not present

## 2019-08-01 NOTE — Patient Instructions (Signed)
Polymorphous Light Eruption Polymorphous light eruption is a skin reaction to rays of energy that are found in sunlight (ultraviolet radiation, or UV radiation). Tanning beds and sunlamps also use UV radiation. Polymorphous light eruption can cause itchy, red patches to develop on skin that is exposed to UV radiation. The patches usually appear immediately after or within a few hours of being in the sun or after using a tanning bed or sunlamp. They often go away on their own within several days or weeks. What are the causes? The cause of this condition is not known. What increases the risk? You are more likely to develop this condition if:  You are a woman.  You have light-colored skin (light complexion).  You live in a northern climate.  You have a family history of this condition. What are the signs or symptoms? Symptoms of this condition include:  Small bumps on the skin.  Itchy patches on the skin.  Redness or scales on the skin.  A feeling of burning on the skin.  Swelling. This is rare.  Blisters. These are rare.  Chills, headache, nausea, and a general feeling of illness (malaise). These symptoms are rare. How is this diagnosed? This condition may be diagnosed based on:  A physical exam.  Your medical history.  Phototests. These are tests to check your skin's reaction to a type of ultraviolet light (UVA light). How is this treated? Polymorphous light eruption may clear up without treatment. Treatment for this condition may include:  Protecting your skin from the sun with clothing and sunscreen.  Avoiding sunlight between the hours of 11 a.m. and 2 p.m.  Applying medicines to the skin to reduce swelling (topical corticosteroids).  Gradually spending more time in the sun to build resistance.  Oral immunosuppressive therapy, in rare cases. These are medicines taken by mouth that reduce the activity of the body's disease-fighting (immune) system. Follow these  instructions at home:   Take and apply over-the-counter and prescription medicines only as told by your health care provider.  Wear clothing to protect your skin from sunlight. This includes long sleeves and hats.  Use broad-spectrum sunscreens that have an SPF of 30 or higher. Broad-spectrum means that the sunscreen protects your skin from both types of UV light (UVA and UVB). Check product labels to make sure that the sunscreen protects against UVA and UVB light.  Follow instructions from your health care provider about being in the sun. You may need to: ? Avoid spending time in the sun between the hours of 11 a.m. and 2 p.m. UV radiation is strongest during those hours. ? Gradually spend more and more time in sunlight.  Keep all follow-up visits as told by your health care provider. This is important. Contact a health care provider if:  You have a fever.  You have severe pain, and medicines do not help. Get help right away if:  You have severe skin blistering, peeling, or bleeding.  You have a very high fever or chills.  You have a severe headache, you feel confused, or you faint.  You have nausea or vomiting.  You develop body aches and pains. Summary  Polymorphous light eruption is a skin reaction to rays of energy found in sunlight (UV radiation). This condition can also be caused by UV radiation from tanning beds and sun lamps.  You may have itchy, red patches on your skin that go away on their own.  You may not need treatment. If you do need treatment,   it usually includes protective clothing to shield your skin from the sun, broad-spectrum sunscreens with an SPF of 30 or higher, and medicines that you apply to your skin. This information is not intended to replace advice given to you by your health care provider. Make sure you discuss any questions you have with your health care provider. Document Revised: 08/24/2018 Document Reviewed: 07/29/2016 Elsevier Patient  Education  2020 [employer]  

## 2019-08-01 NOTE — Progress Notes (Signed)
Patient: Lauren Lloyd Female    DOB: 09-13-1940   79 y.o.   MRN: [id number] Visit Date: 08/01/2019  Today's Provider: Lavon Paganini, MD   Chief Complaint  Patient presents with  . Rash   Subjective:     Rash This is a new problem. The problem is unchanged. The affected locations include the left lower leg and right lower leg. The rash is characterized by redness. [name]. Past treatments include nothing.    This is the first time she was in the sun since starting amiodarone.  The rash was only on exposed skin with sharp demarcation.  Not painful or itching, not spreading, no fevers   Allergies  Allergen Reactions  . Levofloxacin     Other reaction(s): Joint Pains Other reaction(s): Other (See Comments) Joint pain  . Influenza Vaccines Other (See Comments)    Bell's Palsy  . Oysters [Shellfish Allergy] Swelling    She states she had eaten them three days in a row and she developed swelling around her eyes.      Current Outpatient Medications:  .  bisoprolol (ZEBETA) 5 MG tablet, Take 1 tablet (5 mg total) by mouth 2 (two) times daily., Disp: 60 tablet, Rfl: 6 .  furosemide (LASIX) 40 MG tablet, Take 1 tablet (40 mg total) by mouth 2 (two) times daily. Take twice a day anytime weight is 218 or higher. (Patient taking differently: Take 40 mg by mouth daily. Take twice a day anytime weight is 218 or higher.), Disp: 180 tablet, Rfl: 3 .  loperamide (IMODIUM) 2 MG capsule, Take 2 mg by mouth as needed. , Disp: , Rfl:  .  metaxalone (SKELAXIN) 800 MG tablet, Take 1 tablet (800 mg total) by mouth 3 (three) times daily., Disp: 90 tablet, Rfl: 1 .  Multiple Vitamin (MULTIVITAMIN) capsule, Take 1 capsule by mouth daily., Disp: , Rfl:  .  potassium chloride (KLOR-CON) 10 MEQ tablet, Take 1 tablet (10 mEq total) by mouth daily., Disp: 90 tablet, Rfl: 3 .  amiodarone (PACERONE) 200 MG tablet, Take 2 tablets (400 mg total) by mouth 2 (two) times daily for  5 days, THEN 1 tablet (200 mg total) 2 (two) times daily for 7 days, THEN 1 tablet (200 mg total) daily., Disp: 70 tablet, Rfl: 0 .  apixaban (ELIQUIS) 5 MG TABS tablet, Take 1 tablet (5 mg total) by mouth 2 (two) times daily for 30 days., Disp: 60 tablet, Rfl: 11  Review of Systems  Constitutional: Negative.   Skin: Positive for rash. Negative for color change, pallor and wound.    Social History   Tobacco Use  . Smoking status: Former Smoker    Packs/day: 1.00    Years: 30.00    Pack years: 30.00    Types: Cigarettes    Quit date: 05/16/1989    Years since quitting: 30.2  . Smokeless tobacco: Never Used  Substance Use Topics  . Alcohol use: Yes    Alcohol/week: 14.0 standard drinks    Types: 7 Glasses of wine, 7 Standard drinks or equivalent per week      Objective:   Temp (!) 95.9 F (35.5 C) (Temporal)   Wt 216 lb (98 kg)   BMI 33.33 kg/m  Vitals:   08/01/19 0835  Temp: (!) 95.9 F (35.5 C)  TempSrc: Temporal  Weight: 216 lb (98 kg)  Body mass index is 33.33 kg/m.   Physical Exam Vitals reviewed.  Constitutional:  General: She is not in acute distress.    Appearance: She is well-developed.  HENT:     Head: Normocephalic and atraumatic.  Eyes:     General: No scleral icterus.    Conjunctiva/sclera: Conjunctivae normal.  Cardiovascular:     Rate and Rhythm: Normal rate and regular rhythm.  Pulmonary:     Effort: Pulmonary effort is normal. No respiratory distress.  Skin:    General: Skin is warm and dry.     Findings: Rash (erythematous maculopapular rash of lower shins, sharply demarcated at ankles and upper shins) present.  Neurological:     Mental Status: She is alert and oriented to person, place, and time.  Psychiatric:        Behavior: Behavior normal.      No results found for any visits on 08/01/19.     Assessment & Plan    1. Photodermatitis due to sun - new problem - discussed photosensitivity and photodermatitis that can come  from amiodarone - no signs of infection/cellulitis - discussed covering up when going to be in the cun - she is active outside during the summer, so may need to consider alternative to amiodarone for a fib, such as Tikosyn or Flecainide   2. Bradycardia - pulse 52 today - does occasionally get orthostasis symptoms - advised to monitor pulse (goal ~60, do not want <50) - if still symptomatic, break bisoprolol dose in half - return precautions discussed  Return if symptoms worsen or fail to improve.   [name], [name]. Portions of this information were initially documented by Lauren Lloyd, CMA and reviewed by me for thoroughness and accuracy.    Lauren Lloyd, Lauren Bucy, MD MPH [facility]

## 2019-08-05 DIAGNOSIS — G4733 Obstructive sleep apnea (adult) (pediatric): Secondary | ICD-10-CM | POA: Diagnosis not present

## 2019-08-24 NOTE — Progress Notes (Signed)
Date:  08/26/2019   ID:  Lauren Lloyd Nov 21, 1940, MRN [id number]  Patient Location:  [address] [place] [address]   Provider location:   [facility] [place] [facility]  PCP:  [name], MD  Cardiologist:  [facility]  Chief Complaint  Patient presents with  . office visit    3 month F/U; Meds verbally reviewed with patient.    History of Present Illness:    Lauren Lloyd is a 79 y.o. female  past medical history of Aortic atherosclerosis on CT scan 2020 hypertension,  asthma  Chronic lower extremity swelling Hospital admission for atrial fibrillation December 2019 Associated acute diastolic CHF Status post successful DCCV on 06/15/2018.  NSR on 06/2018 stress test, 07/2018 Showing no significant ischemia Mild lower extremity swelling, chronic Who presents for hospital follow-up, follow-up of her atrial fibrillation and acute on chronic diastolic CHF  In follow-up today, reports that she continues to have some shortness of breath on exertion Sedentary, no regular exercise program Left knee is giving her problems  Weight down, compared to her baseline On fluid pills, regularly On CPAP,just started, thinks it is working  Hypersensitive to sun Was working in the sun, developed a rash forearms and ankles Was told it might be her amiodarone  Has a walking trip planned in 2 weeks Put a brace on her knee, had swelling around the ankle  Takes lasix 40 mg daily Rarely take extra lasix in afternoon Drinking more water  EKG personally reviewed by myself on todays visit Shows NSR rate 48 no ST or T wave changes  Other past medical history reviewed  Nov 2020, developed atrial fib Seen in clinic 11/16 Started on amiodarone, higher dose metoprolol Lasix increased,40 BID 3 days then back to 40 daily She continued on 40 BID  She presented to the clinic and had stopped her amiodarone  She was still in normal sinus rhythm but  she had worsening shortness of breath  She reported very high fluid intake  REDS vest 40% on 04/16/2019, goal less than 36  stress test, 07/2018 Showing no significant ischemia normal ejection fraction normal perfusion  Presented to the hospital 04/2018 with worsening shortness of breath, leg swelling, weight gain, abdominal bloating and new atrial fibrillation with RVR Initially on Cardizem infusion, changed to oral Cardizem with titration upwards Discharged on Cardizem CD 300 mg and metoprolol 25 mg bid, Eliquis. Treated with IV Lasix during her hospital course with marked improvement in her shortness of breath  Carotid ultrasound January 2020 Less than 39% disease bilaterally   Past Medical History:  Diagnosis Date  . Arthritis    knees, Hands  . GERD (gastroesophageal reflux disease)   . Persistent atrial fibrillation ([place])    a.  Diagnosed 12/19; b. CHADS2VASc => 6 (CHF, HTN, age x 2, vascular disease, female); c. Eliquis   Past Surgical History:  Procedure Laterality Date  . CARDIOVERSION N/A 06/15/2018   Procedure: CARDIOVERSION (CATH LAB);  Surgeon: Minna Merritts, MD;  Location: [facility] ORS;  Service: Cardiovascular;  Laterality: N/A;  . CATARACT EXTRACTION W/PHACO Right 01/25/2016   Procedure: CATARACT EXTRACTION PHACO AND INTRAOCULAR LENS PLACEMENT ([place]);  Surgeon: Ronnell Freshwater, MD;  Location: [facility];  Service: Ophthalmology;  Laterality: Right;  RIGHT  . CATARACT EXTRACTION W/PHACO Left 02/22/2016   Procedure: CATARACT EXTRACTION PHACO AND INTRAOCULAR LENS PLACEMENT ([facility]);  Surgeon: Ronnell Freshwater, MD;  Location: [facility];  Service: Ophthalmology;  Laterality: Left;  LEFT  . [place]  2010  . KNEE ARTHROSCOPY Right 2004  . REPLACEMENT TOTAL KNEE Right 2009   [facility]  . SKIN GRAFT Left 04/08/2013   Done on left index finger  . TONSILLECTOMY  1946     Current Meds  Medication Sig  .  amiodarone (PACERONE) 200 MG tablet Take 2 tablets (400 mg total) by mouth 2 (two) times daily for 5 days, THEN 1 tablet (200 mg total) 2 (two) times daily for 7 days, THEN 1 tablet (200 mg total) daily.  [name] Kitchen amiodarone (PACERONE) 200 MG tablet Take 200 mg by mouth daily.  [name] Kitchen apixaban (ELIQUIS) 5 MG TABS tablet Take 1 tablet (5 mg total) by mouth 2 (two) times daily for 30 days.  . bisoprolol (ZEBETA) 5 MG tablet Take 1 tablet (5 mg total) by mouth 2 (two) times daily. (Patient taking differently: Take 2.5 mg by mouth 2 (two) times daily. )  . diclofenac Sodium (VOLTAREN) 1 % GEL Apply topically 4 (four) times daily as needed.  . furosemide (LASIX) 40 MG tablet Take 1 tablet (40 mg total) by mouth 2 (two) times daily. Take twice a day anytime weight is 218 or higher. (Patient taking differently: Take 40 mg by mouth daily. Take twice a day anytime weight is 218 or higher.)  . loperamide (IMODIUM) 2 MG capsule Take 2 mg by mouth as needed.   . metaxalone (SKELAXIN) 800 MG tablet Take 1 tablet (800 mg total) by mouth 3 (three) times daily. (Patient taking differently: Take 800 mg by mouth 3 (three) times daily as needed. )  . Multiple Vitamin (MULTIVITAMIN) capsule Take 1 capsule by mouth daily.  . potassium chloride (KLOR-CON) 10 MEQ tablet Take 1 tablet (10 mEq total) by mouth daily.     Allergies:   Levofloxacin, Influenza vaccines, and Oysters [shellfish allergy]   Social History   Tobacco Use  . Smoking status: Former Smoker    Packs/day: 1.00    Years: 30.00    Pack years: 30.00    Types: Cigarettes    Quit date: 05/16/1989    Years since quitting: 30.2  . Smokeless tobacco: Never Used  Substance Use Topics  . Alcohol use: Yes    Alcohol/week: 7.0 standard drinks    Types: 7 Glasses of wine per week    Comment: weekly  . Drug use: No     Current Outpatient Medications on File Prior to Visit  Medication Sig Dispense Refill  . amiodarone (PACERONE) 200 MG tablet Take 2 tablets (400 mg  total) by mouth 2 (two) times daily for 5 days, THEN 1 tablet (200 mg total) 2 (two) times daily for 7 days, THEN 1 tablet (200 mg total) daily. 70 tablet 0  . amiodarone (PACERONE) 200 MG tablet Take 200 mg by mouth daily.    [name] Kitchen apixaban (ELIQUIS) 5 MG TABS tablet Take 1 tablet (5 mg total) by mouth 2 (two) times daily for 30 days. 60 tablet 11  . bisoprolol (ZEBETA) 5 MG tablet Take 1 tablet (5 mg total) by mouth 2 (two) times daily. (Patient taking differently: Take 2.5 mg by mouth 2 (two) times daily. ) 60 tablet 6  . diclofenac Sodium (VOLTAREN) 1 % GEL Apply topically 4 (four) times daily as needed.    . furosemide (LASIX) 40 MG tablet Take 1 tablet (40 mg total) by mouth 2 (two) times daily. Take twice a day anytime weight is  218 or higher. (Patient taking differently: Take 40 mg by mouth daily. Take twice a day anytime weight is 218 or higher.) 180 tablet 3  . loperamide (IMODIUM) 2 MG capsule Take 2 mg by mouth as needed.     . metaxalone (SKELAXIN) 800 MG tablet Take 1 tablet (800 mg total) by mouth 3 (three) times daily. (Patient taking differently: Take 800 mg by mouth 3 (three) times daily as needed. ) 90 tablet 1  . Multiple Vitamin (MULTIVITAMIN) capsule Take 1 capsule by mouth daily.    . potassium chloride (KLOR-CON) 10 MEQ tablet Take 1 tablet (10 mEq total) by mouth daily. 90 tablet 3   No current facility-administered medications on file prior to visit.     Family Hx: [name] family history includes Atrial fibrillation in her sister and sister; Breast cancer (age of onset: 20) in her sister; Healthy in her brother; Heart attack in her father; Hyperlipidemia in her sister and sister; Transient ischemic attack in her mother.  ROS:   Please see the history of present illness.    Review of Systems  Constitutional: Negative.   HENT: Negative.   Respiratory: Positive for shortness of breath.   Cardiovascular: Negative.   Gastrointestinal: Negative.   Musculoskeletal:  Positive for joint pain.  Neurological: Negative.   Psychiatric/Behavioral: Negative.   All other systems reviewed and are negative.    Labs/Other Tests and Data Reviewed:    Recent Labs: 04/01/2019: Hemoglobin 13.0; Platelets 274; TSH 1.193 07/15/2019: ALT 13; BUN 20; Creatinine, Ser 1.02; Potassium 4.4; Sodium 141   Recent Lipid Panel Lab Results  Component Value Date/Time   CHOL 257 (H) 07/15/2019 09:31 AM   TRIG 158 (H) 07/15/2019 09:31 AM   HDL 66 07/15/2019 09:31 AM   CHOLHDL 3.6 05/31/2018 04:52 AM   LDLCALC 163 (H) 07/15/2019 09:31 AM    Wt Readings from Last 3 Encounters:  08/26/19 214 lb (97.1 kg)  08/01/19 216 lb (98 kg)  07/15/19 217 lb 9.6 oz (98.7 kg)     Exam:    BP 122/80 (BP Location: Left Arm, Patient Position: Sitting, Cuff Size: Normal)   Pulse (!) 48   Ht 5\' 7"  (1.702 m)   Wt 214 lb (97.1 kg)   SpO2 94%   BMI 33.52 kg/m  Constitutional:  oriented to person, place, and time. No distress.  Obese obese HENT:  Head: Grossly normal Eyes:  no discharge. No scleral icterus.  Neck: No JVD, no carotid bruits  Cardiovascular: Regular rate and rhythm, no murmurs appreciated Pulmonary/Chest: Clear to auscultation bilaterally, no wheezes or rails Abdominal: Soft.  no distension.  no tenderness.  Musculoskeletal: Normal range of motion Neurological:  normal muscle tone. Coordination normal. No atrophy Skin: Skin warm and dry Psychiatric: normal affect, pleasant   ASSESSMENT & PLAN:    Paroxysmal atrial fibrillation (HCC) -  Maintaining normal sinus rhythm on today's visit Weight 214, low end of her range Sinus bradycardia today, rate 48 Sun sensitivity, she is concerned about the amiodarone We will cut the amiodarone down to 100 daily  Knee pain Lots of pain, on voltaren gel Has a brace, Limiting her ability to exercise  Chronic diastolic CHF (congestive heart failure) (HCC) Reports she is compliant with Lasix 40 daily Rare extra lasix, Drinks a  lot Stable weight down   Leg edema stable We will avoid calcium channel blockers Continue on the Lasix  Smoker Referral to pulmonary As follow-up Recommend lifestyle modification, walking program, weight loss  Centrilobular emphysema ([place])  Concern for bronchiectasis given chronic cough with yellow sputum past 3 years or so Has been seen by pulmonary  Pulmonary hypertension, unspecified (HCC) Moderate pulmonary hypertension seen on prior echocardiogram Continue Lasix 40 daily Lasix 40 twice daily for weight 218 or higher  Back pain, knee pain Recommended weight loss Strict low carbohydrate diet   Total encounter time more than 25 minutes  Greater than 50% was spent in counseling and coordination of care with the patient   Disposition: Follow-up in 6 month   Signed, Ida Rogue, MD  08/26/2019 9:35 AM    [facility] [address], [place], [place] [address]

## 2019-08-26 ENCOUNTER — Ambulatory Visit (INDEPENDENT_AMBULATORY_CARE_PROVIDER_SITE_OTHER): Payer: Medicare HMO | Admitting: Cardiovascular Disease

## 2019-08-26 ENCOUNTER — Other Ambulatory Visit: Payer: Self-pay

## 2019-08-26 ENCOUNTER — Encounter: Payer: Self-pay | Admitting: Cardiovascular Disease

## 2019-08-26 VITALS — BP 122/80 | HR 48 | Ht 67.0 in | Wt 214.0 lb

## 2019-08-26 DIAGNOSIS — I4819 Other persistent atrial fibrillation: Secondary | ICD-10-CM | POA: Diagnosis not present

## 2019-08-26 DIAGNOSIS — I5032 Chronic diastolic (congestive) heart failure: Secondary | ICD-10-CM | POA: Diagnosis not present

## 2019-08-26 DIAGNOSIS — I272 Pulmonary hypertension, unspecified: Secondary | ICD-10-CM | POA: Diagnosis not present

## 2019-08-26 DIAGNOSIS — J432 Centrilobular emphysema: Secondary | ICD-10-CM

## 2019-08-26 DIAGNOSIS — I2510 Atherosclerotic heart disease of native coronary artery without angina pectoris: Secondary | ICD-10-CM

## 2019-08-26 DIAGNOSIS — I10 Essential (primary) hypertension: Secondary | ICD-10-CM

## 2019-08-26 DIAGNOSIS — I2584 Coronary atherosclerosis due to calcified coronary lesion: Secondary | ICD-10-CM | POA: Diagnosis not present

## 2019-08-26 MED ORDER — AMIODARONE HCL 100 MG PO TABS: 100.0000 mg | ORAL_TABLET | Freq: Every day | ORAL | 3 refills | Status: DC

## 2019-08-26 NOTE — Patient Instructions (Addendum)
Medication Instructions:  Amiodarone down to 100 mg daily  If you need a refill on your cardiac medications before your next appointment, please call your pharmacy.    Lab work: No new labs needed   If you have labs (blood work) drawn today and your tests are completely normal, you will receive your results only by: [name] Kitchen MyChart Message (if you have MyChart) OR . A paper copy in the mail If you have any lab test that is abnormal or we need to change your treatment, we will call you to review the results.   Testing/Procedures: No new testing needed   Follow-Up: At [facility], you and your health needs are our priority.  As part of our continuing mission to provide you with exceptional heart care, we have created designated Provider Care Teams.  These Care Teams include your primary Cardiologist (physician) and Advanced Practice Providers (APPs -  Physician Assistants and Nurse Practitioners) who all work together to provide you with the care you need, when you need it.  . You will need a follow up appointment in 12 months  . Providers on your designated Care Team:   . Murray Hodgkins, NP . Christell Faith, PA-C . Marrianne Mood, PA-C  Any Other Special Instructions Will Be Listed Below (If Applicable).  For educational health videos Log in to : [email or web address] Or : [email or web address], password : [email or web address]

## 2019-09-02 DIAGNOSIS — R0600 Dyspnea, unspecified: Secondary | ICD-10-CM | POA: Diagnosis not present

## 2019-09-02 DIAGNOSIS — Z01818 Encounter for other preprocedural examination: Secondary | ICD-10-CM | POA: Diagnosis not present

## 2019-09-05 DIAGNOSIS — G4733 Obstructive sleep apnea (adult) (pediatric): Secondary | ICD-10-CM | POA: Diagnosis not present

## 2019-09-16 DIAGNOSIS — J42 Unspecified chronic bronchitis: Secondary | ICD-10-CM | POA: Diagnosis not present

## 2019-09-16 DIAGNOSIS — Z1331 Encounter for screening for depression: Secondary | ICD-10-CM | POA: Diagnosis not present

## 2019-09-21 ENCOUNTER — Other Ambulatory Visit: Payer: Self-pay | Admitting: Cardiovascular Disease

## 2019-09-23 DIAGNOSIS — D2261 Melanocytic nevi of right upper limb, including shoulder: Secondary | ICD-10-CM | POA: Diagnosis not present

## 2019-09-23 DIAGNOSIS — D225 Melanocytic nevi of trunk: Secondary | ICD-10-CM | POA: Diagnosis not present

## 2019-09-23 DIAGNOSIS — Z85828 Personal history of other malignant neoplasm of skin: Secondary | ICD-10-CM | POA: Diagnosis not present

## 2019-09-23 DIAGNOSIS — D2271 Melanocytic nevi of right lower limb, including hip: Secondary | ICD-10-CM | POA: Diagnosis not present

## 2019-09-23 DIAGNOSIS — D2262 Melanocytic nevi of left upper limb, including shoulder: Secondary | ICD-10-CM | POA: Diagnosis not present

## 2019-09-23 NOTE — Telephone Encounter (Signed)
Pt's age 79, wt 97.1 kg, SCr 1.02, CrCl 68.55, last ov w/ TG 08/26/19.

## 2019-09-23 NOTE — Telephone Encounter (Signed)
Refill Request.  

## 2019-10-05 DIAGNOSIS — G4733 Obstructive sleep apnea (adult) (pediatric): Secondary | ICD-10-CM | POA: Diagnosis not present

## 2019-10-23 DIAGNOSIS — H5203 Hypermetropia, bilateral: Secondary | ICD-10-CM | POA: Diagnosis not present

## 2019-11-05 DIAGNOSIS — G4733 Obstructive sleep apnea (adult) (pediatric): Secondary | ICD-10-CM | POA: Diagnosis not present

## 2019-11-08 ENCOUNTER — Other Ambulatory Visit: Payer: Self-pay

## 2019-11-08 ENCOUNTER — Encounter: Payer: Self-pay | Admitting: Family Medicine

## 2019-11-08 ENCOUNTER — Ambulatory Visit (INDEPENDENT_AMBULATORY_CARE_PROVIDER_SITE_OTHER): Payer: Medicare HMO | Admitting: Family Medicine

## 2019-11-08 VITALS — BP 110/62 | HR 40 | Temp 98.2°F | Wt 216.0 lb

## 2019-11-08 DIAGNOSIS — R001 Bradycardia, unspecified: Secondary | ICD-10-CM | POA: Diagnosis not present

## 2019-11-08 DIAGNOSIS — M170 Bilateral primary osteoarthritis of knee: Secondary | ICD-10-CM | POA: Diagnosis not present

## 2019-11-08 DIAGNOSIS — G4733 Obstructive sleep apnea (adult) (pediatric): Secondary | ICD-10-CM

## 2019-11-08 DIAGNOSIS — E669 Obesity, unspecified: Secondary | ICD-10-CM | POA: Diagnosis not present

## 2019-11-08 DIAGNOSIS — R42 Dizziness and giddiness: Secondary | ICD-10-CM | POA: Insufficient documentation

## 2019-11-08 MED ORDER — BISOPROLOL FUMARATE 5 MG PO TABS: 2.5000 mg | ORAL_TABLET | Freq: Two times a day (BID) | ORAL | 1 refills | Status: DC

## 2019-11-08 NOTE — Progress Notes (Signed)
I,Laura E Walsh,acting as a scribe for Lavon Paganini, MD.,have documented all relevant documentation on the behalf of Lavon Paganini, MD,as directed by  Lavon Paganini, MD while in the presence of Lavon Paganini, MD.   Established patient visit   Patient: Lauren Lloyd   DOB: 07-Oct-1940   79 y.o. Female  MRN: [id number] Visit Date: 11/08/2019  Today's healthcare provider: Lavon Paganini, MD   Chief Complaint  Patient presents with  . Dizziness  . Sleep Apnea  . Knee Pain    Left knee, Chronic issue.   Subjective    Knee Pain  Incident onset: Chronic issues, has been going on for several years. There was no injury mechanism. Pain location: Left knee. Associated symptoms include an inability to bear weight and muscle weakness. Pertinent negatives include no loss of motion, loss of sensation, numbness or tingling.  Dizziness This is a chronic problem. Episode onset: Started over a year ago. The problem has been waxing and waning. Associated symptoms include arthralgias and joint swelling. Pertinent negatives include no congestion, coughing, headaches, myalgias, neck pain or numbness. Exacerbated by: Only when sitting.     Sleep Apnea Pt would like to return her Cpap machine.  She has tried to use it for several months and had a CPAP titration study done as well.  Despite new mask and tubing and fitting, she is unable to tolerate it at night.  She wakes multiple times overnight due to air leaks and such.    Patient Active Problem List   Diagnosis Date Noted  . Obesity (BMI 30-39.9) 11/08/2019  . Bradycardia 11/08/2019  . Dizziness 11/08/2019  . Dyspnea on exertion 08/28/2018  . Degenerative cervical spinal stenosis 07/26/2018  . Degenerative arthritis of left knee 07/26/2018  . Persistent atrial fibrillation ([place]) 06/06/2018  . Paresthesias 05/30/2018  . Chronic diastolic CHF (congestive heart failure) ([place]) 05/11/2018  . Centrilobular emphysema ([place])  05/11/2018  . Hematuria 04/04/2017  . Leg cramps 04/04/2017  . Metatarsalgia of both feet 04/04/2017  . 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 Hypertension 01/03/2017  . Hemorrhoids 01/03/2017  . Healthcare maintenance 01/03/2017  . Chronic right shoulder pain 01/03/2017  . Pulmonary hypertension, unspecified ([place]) 04/02/2015  . Chest pain radiating to arm 02/17/2015  . Acid reflux 11/18/2014  . IBS (irritable bowel syndrome) 11/18/2014  . Primary osteoarthritis of one hip 10/03/2011  . L-S radiculopathy 09/29/2011  . Arthritis of knee, degenerative 09/29/2011  . S/P knee replacement 09/29/2011  . Non-toxic uninodular goiter 06/20/2009  . Cervical pain 08/20/2008  . OSA (obstructive sleep apnea) 12/12/2007  . Hypercholesteremia 07/30/2007   Social History   Tobacco Use  . Smoking status: Former Smoker    Packs/day: 1.00    Years: 30.00    Pack years: 30.00    Types: Cigarettes    Quit date: 05/16/1989    Years since quitting: 30.5  . Smokeless tobacco: Never Used  Vaping Use  . Vaping Use: Never used  Substance Use Topics  . Alcohol use: Yes    Alcohol/week: 7.0 standard drinks    Types: 7 Glasses of wine per week    Comment: weekly  . Drug use: No   Allergies  Allergen Reactions  . Levofloxacin     Other reaction(s): Joint Pains Other reaction(s): Other (See Comments) Joint pain  . Influenza Vaccines Other (See Comments)    Bell's Palsy  . Oysters [Shellfish Allergy] Swelling    She states she had eaten them three  days in a row and she developed swelling around her eyes.      Medications: Outpatient Medications Prior to Visit  Medication Sig  . amiodarone (PACERONE) 100 MG tablet Take 1 tablet (100 mg total) by mouth daily.  . diclofenac Sodium (VOLTAREN) 1 % GEL Apply topically 4 (four) times daily as needed.  [name] Kitchen ELIQUIS 5 MG TABS tablet Take one (1) tablet BY MOUTH TWICE DAILY  . furosemide (LASIX) 40 MG tablet Take 1 tablet (40 mg total) by mouth 2  (two) times daily. Take twice a day anytime weight is 218 or higher. (Patient taking differently: Take 40 mg by mouth daily. Take twice a day anytime weight is 218 or higher.)  . loperamide (IMODIUM) 2 MG capsule Take 2 mg by mouth as needed.   . Multiple Vitamin (MULTIVITAMIN) capsule Take 1 capsule by mouth daily.  . potassium chloride (KLOR-CON) 10 MEQ tablet Take 1 tablet (10 mEq total) by mouth daily.  . [DISCONTINUED] bisoprolol (ZEBETA) 5 MG tablet Take 1 tablet (5 mg total) by mouth 2 (two) times daily.   No facility-administered medications prior to visit.    Review of Systems  Constitutional: Negative.   HENT: Negative for congestion, ear discharge, ear pain, hearing loss, sinus pressure, sinus pain and tinnitus.   Eyes: Negative.   Respiratory: Positive for shortness of breath (Chronic issue; pt reports no changes.). Negative for apnea, cough, choking, chest tightness, wheezing and stridor.   Gastrointestinal: Negative.   Musculoskeletal: Positive for arthralgias, back pain, gait problem and joint swelling. Negative for myalgias, neck pain and neck stiffness.  Neurological: Positive for dizziness and light-headedness. Negative for tingling, syncope, numbness and headaches.      Objective    BP 110/62 (BP Location: Left Arm, Patient Position: Sitting, Cuff Size: Large)   Pulse (!) 40   Temp 98.2 F (36.8 C) (Temporal)   Wt 216 lb (98 kg)   SpO2 96%   BMI 33.83 kg/m    Physical Exam Vitals and nursing note reviewed.  Constitutional:      Appearance: Normal appearance.  Cardiovascular:     Rate and Rhythm: Regular rhythm. Bradycardia present.     Pulses: Normal pulses.     Heart sounds: Murmur heard.   Pulmonary:     Effort: Pulmonary effort is normal. No respiratory distress.     Breath sounds: Normal breath sounds. No wheezing or rhonchi.  Musculoskeletal:     Right knee: Normal.     Left knee: Swelling present. No deformity or erythema. Normal range of motion.  Tenderness present over the medial joint line. No lateral joint line tenderness. No LCL laxity, MCL laxity, ACL laxity or PCL laxity.    Right lower leg: No edema.     Left lower leg: No edema.     Comments: Right knee status post TKR  Skin:    General: Skin is warm and dry.     Findings: No rash.  Neurological:     Mental Status: She is alert and oriented to person, place, and time. Mental status is at baseline.  Psychiatric:        Mood and Affect: Mood normal.        Behavior: Behavior normal.        Thought Content: Thought content normal.     No results found for any visits on 11/08/19.  Assessment & Plan     Problem List Items Addressed This Visit      Respiratory   OSA (  obstructive sleep apnea)    Pt is not tolerating her CPap machine.  May consider Bipap in the future. She has tried several masks with not help.  Will follow up with pulmonologist in September as scheduled.   Should also discuss with cardiology as they have prescribed a CPAP machine        Musculoskeletal and Integument   Arthritis of knee, degenerative    Longstanding issue Previously seen by orthopedics and had x-rays done She was told that time that she had significant degenerative changes and would benefit from a knee replacement She does not feel that she is in a good place to have a knee replacement at this time and would like to hold off if she can She has never had corticosteroid injections into this knee but did have them in her right knee previously We agreed to proceed with steroid injection today        Other   Obesity (BMI 30-39.9)    Will refer to Medical Weight Loss and Management.  Discussed importance of healthy weight management Discussed diet and exercise       Relevant Orders   Amb ref to Medical Nutrition Therapy-MNT   Bradycardia    New problem Related to her medications Heart rate of 40 Likely that her dizziness is related to her bradycardia Advised her to decrease  her bisoprolol dose to 2.5 mg twice daily If her heart rate continues to be in the 40s, she will need to hold her bisoprolol Advised her to follow-up with cardiology      Dizziness - Primary    No vertigo Likely related to her bradycardia Plan as above         INJECTION: Patient was given informed consent,. Appropriate time out was taken. Area prepped and draped in usual sterile fashion.  1 cc of depo-medrol 40 mg/ml plus 4 cc of 1% lidocaine without epinephrine was injected into the left knee using a(n) anterior lateral approach. The patient tolerated the procedure well. There were no complications. Post procedure instructions were given.   Return in about 3 months (around 02/08/2020) for CPE, AWV, as scheduled.      I, Lavon Paganini, MD, have reviewed all documentation for this visit. The documentation on 11/08/19 for the exam, diagnosis, procedures, and orders are all accurate and complete.   Gailen Venne, Dionne Bucy, MD, MPH [facility]

## 2019-11-08 NOTE — Assessment & Plan Note (Addendum)
Will refer to Medical Weight Loss and Management.  Discussed importance of healthy weight management Discussed diet and exercise

## 2019-11-08 NOTE — Assessment & Plan Note (Signed)
New problem Related to her medications Heart rate of 40 Likely that her dizziness is related to her bradycardia Advised her to decrease her bisoprolol dose to 2.5 mg twice daily If her heart rate continues to be in the 40s, she will need to hold her bisoprolol Advised her to follow-up with cardiology

## 2019-11-08 NOTE — Assessment & Plan Note (Signed)
Longstanding issue Previously seen by orthopedics and had x-rays done She was told that time that she had significant degenerative changes and would benefit from a knee replacement She does not feel that she is in a good place to have a knee replacement at this time and would like to hold off if she can She has never had corticosteroid injections into this knee but did have them in her right knee previously We agreed to proceed with steroid injection today

## 2019-11-08 NOTE — Patient Instructions (Signed)

## 2019-11-08 NOTE — Assessment & Plan Note (Addendum)
Pt is not tolerating her CPap machine.  May consider Bipap in the future. She has tried several masks with not help.  Will follow up with pulmonologist in September as scheduled.   Should also discuss with cardiology as they have prescribed a CPAP machine

## 2019-11-08 NOTE — Assessment & Plan Note (Signed)
No vertigo Likely related to her bradycardia Plan as above

## 2019-11-21 ENCOUNTER — Telehealth: Payer: Self-pay

## 2019-11-21 NOTE — Telephone Encounter (Signed)
Copied from [place] [id number]. Topic: General - Other >> Nov 21, 2019 11:02 AM Keene Breath wrote: Reason for CRM: Patient is calling to have the nurse follow up on a referral for a nutritionist in Dr. Nancy Nordmann absence.  She stated it has been a few weeks and she has not heard from anyone yet.  Please advise and give patient an update at [phone]

## 2019-11-22 ENCOUNTER — Telehealth: Payer: Self-pay

## 2019-11-22 NOTE — Telephone Encounter (Signed)
Copied from [place] [id number]. Topic: General - Other >> Nov 22, 2019  8:59 AM Oneta Rack wrote: Reason for CRM: Patient follow up on nutrition referral and would like a follow up call.

## 2019-11-25 NOTE — Telephone Encounter (Signed)
Pt is calling back checking on the status of the referral to [facility]

## 2019-12-03 DIAGNOSIS — I788 Other diseases of capillaries: Secondary | ICD-10-CM | POA: Diagnosis not present

## 2019-12-03 DIAGNOSIS — Z419 Encounter for procedure for purposes other than remedying health state, unspecified: Secondary | ICD-10-CM | POA: Diagnosis not present

## 2019-12-05 ENCOUNTER — Ambulatory Visit: Payer: Medicare HMO | Admitting: Dietician

## 2019-12-05 DIAGNOSIS — G4733 Obstructive sleep apnea (adult) (pediatric): Secondary | ICD-10-CM | POA: Diagnosis not present

## 2019-12-09 ENCOUNTER — Encounter (INDEPENDENT_AMBULATORY_CARE_PROVIDER_SITE_OTHER): Payer: Self-pay | Admitting: Bariatrics

## 2019-12-09 ENCOUNTER — Ambulatory Visit (INDEPENDENT_AMBULATORY_CARE_PROVIDER_SITE_OTHER): Payer: Medicare HMO | Admitting: Bariatrics

## 2019-12-09 ENCOUNTER — Other Ambulatory Visit: Payer: Self-pay

## 2019-12-09 VITALS — BP 140/80 | HR 45 | Temp 98.3°F | Ht 66.0 in | Wt 213.0 lb

## 2019-12-09 DIAGNOSIS — Z6835 Body mass index (BMI) 35.0-35.9, adult: Secondary | ICD-10-CM

## 2019-12-09 DIAGNOSIS — E7800 Pure hypercholesterolemia, unspecified: Secondary | ICD-10-CM | POA: Diagnosis not present

## 2019-12-09 DIAGNOSIS — D6859 Other primary thrombophilia: Secondary | ICD-10-CM

## 2019-12-09 DIAGNOSIS — Z0289 Encounter for other administrative examinations: Secondary | ICD-10-CM

## 2019-12-09 DIAGNOSIS — Z1331 Encounter for screening for depression: Secondary | ICD-10-CM

## 2019-12-09 DIAGNOSIS — M25562 Pain in left knee: Secondary | ICD-10-CM

## 2019-12-09 DIAGNOSIS — E559 Vitamin D deficiency, unspecified: Secondary | ICD-10-CM

## 2019-12-09 DIAGNOSIS — R7309 Other abnormal glucose: Secondary | ICD-10-CM | POA: Diagnosis not present

## 2019-12-09 DIAGNOSIS — G4733 Obstructive sleep apnea (adult) (pediatric): Secondary | ICD-10-CM | POA: Diagnosis not present

## 2019-12-09 DIAGNOSIS — R5383 Other fatigue: Secondary | ICD-10-CM | POA: Diagnosis not present

## 2019-12-09 DIAGNOSIS — M25561 Pain in right knee: Secondary | ICD-10-CM | POA: Diagnosis not present

## 2019-12-09 DIAGNOSIS — K588 Other irritable bowel syndrome: Secondary | ICD-10-CM | POA: Diagnosis not present

## 2019-12-09 DIAGNOSIS — I10 Essential (primary) hypertension: Secondary | ICD-10-CM

## 2019-12-09 DIAGNOSIS — R0602 Shortness of breath: Secondary | ICD-10-CM | POA: Diagnosis not present

## 2019-12-09 NOTE — Progress Notes (Signed)
Chief Complaint:   OBESITY Lauren Lloyd (MR# [id number]) is a 79 y.o. female who presents for evaluation and treatment of obesity and related comorbidities. Current BMI is Body mass index is 34.38 kg/m.Lauren Lloyd has been struggling with her weight for many years and has been unsuccessful in either losing weight, maintaining weight loss, or reaching her healthy weight goal.  Lauren Lloyd is currently in the action stage of change and ready to dedicate time achieving and maintaining a healthier weight. Lauren Lloyd is interested in becoming our patient and working on intensive lifestyle modifications including (but not limited to) diet and exercise for weight loss.  Lauren Lloyd likes to cook and denies obstacles. She likes salty and sweets.  Lauren Lloyd's habits were reviewed today and are as follows: Her family eats meals together, she thinks her family will eat healthier with her, her desired weight loss is 28 lbs, she started gaining weight at age 1/smoking cessation, her heaviest weight ever was 222 pounds, she craves anything fried, she snacks frequently in the evenings, she wakes up frequently in the middle of the night to eat, she skips breakfast once a week, she frequently makes poor food choices, she frequently eats larger portions than normal, she has binge eating behaviors and she struggles with emotional eating.  Depression Screen Lauren Lloyd's Food and Mood (modified PHQ-9) score was 9.  Depression screen [facility] 2/9 12/09/2019  Decreased Interest 3  Down, Depressed, Hopeless 1  PHQ - 2 Score 4  Altered sleeping 1  Tired, decreased energy 2  Change in appetite 1  Feeling bad or failure about yourself  1  Trouble concentrating 0  Moving slowly or fidgety/restless 0  Suicidal thoughts 0  PHQ-9 Score 9  Difficult doing work/chores Not difficult at all   Subjective:   Other fatigue. Hien denies daytime somnolence and denies waking up still tired. Patent has a history of symptoms of Epworth sleepiness scale.  Lauren Lloyd generally gets 6 hours of sleep per night, and states that she has generally restful sleep. Snoring is present. Apneic episodes are not present. Epworth Sleepiness Score is 14.  Shortness of breath on exertion. Lauren Lloyd notes increasing shortness of breath with certain activities and seems to be worsening over time with weight gain. She notes getting out of breath sooner with activity than she used to. This has gotten worse recently. Lauren Lloyd denies shortness of breath at rest or orthopnea.  OSA (obstructive sleep apnea). Lauren Lloyd reports difficulty with her mask and is not using CPAP. Epworth Sleepiness Scale score was 14.  Thrombophilia ([place]). Lauren Lloyd has a history of atrial fibrillation and is taking Eliquis.  Pain in both knees, unspecified chronicity. Konstantina has had right knee replacement in the past and reports no replacement of the left knee.  Essential hypertension. Jane is taking bisoprolol.  BP Readings from Last 3 Encounters:  12/09/19 (!) 140/80  11/08/19 110/62  08/26/19 122/80   Lab Results  Component Value Date   CREATININE 1.02 (H) 07/15/2019   CREATININE 1.22 (H) 04/15/2019   CREATININE 1.22 (H) 04/01/2019   Hypercholesteremia. Total cholesterol was elevated at 257 on 07/15/2019. Lindsy is on no medication.  Vitamin D deficiency. [name] is taking a multivitamin.   Ref. Range 07/15/2019 09:31  Vitamin D, 25-Hydroxy Latest Ref Range: 30.0 - 100.0 ng/mL 21.4 (L)   Other irritable bowel syndrome. [name] endorses diarrhea and denies triggers.  Elevated glucose. Lauren Lloyd reports polyphagia.  Depression screening. Lauren Lloyd had a mildly positive depression screen with a PHQ-9 score of  9.  Assessment/Plan:   Other fatigue. Lauren Lloyd does feel that her weight is causing her energy to be lower than it should be. Fatigue may be related to obesity, depression or many other causes. Labs will be ordered, and in the meanwhile, Lauren Lloyd will focus on self care including making healthy food choices,  increasing physical activity and focusing on stress reduction. EKG 12-Lead, T3, T4, free, TSH  Shortness of breath on exertion. Lauren Lloyd does feel that she gets out of breath more easily that she used to when she exercises. Lauren Lloyd's shortness of breath appears to be obesity related and exercise induced. She has agreed to work on weight loss and gradually increase exercise to treat her exercise induced shortness of breath. Will continue to monitor closely.  OSA (obstructive sleep apnea). Intensive lifestyle modifications are the first line treatment for this issue. We discussed several lifestyle modifications today and she will continue to work on diet, exercise and weight loss efforts. We will continue to monitor. Orders and follow up as documented in patient record. We discussed the importance of restful sleep.  Counseling  Sleep apnea is a condition in which breathing pauses or becomes shallow during sleep. This happens over and over during the night. This disrupts your sleep and keeps your body from getting the rest that it needs, which can cause tiredness and lack of energy (fatigue) during the day.  Sleep apnea treatment: If you were given a device to open your airway while you sleep, USE IT!  Sleep hygiene:   Limit or avoid alcohol, caffeinated beverages, and cigarettes, especially close to bedtime.   Do not eat a large meal or eat spicy foods right before bedtime. This can lead to digestive discomfort that can make it hard for you to sleep.  Keep a sleep diary to help you and your health care provider figure out what could be causing your insomnia.  . Make your bedroom a dark, comfortable place where it is easy to fall asleep. ? Put up shades or blackout curtains to block light from outside. ? Use a white noise machine to block noise. ? Keep the temperature cool. . Limit screen use before bedtime. This includes: ? Watching TV. ? Using your smartphone, tablet, or computer. . Stick to a  routine that includes going to bed and waking up at the same times every day and night. This can help you fall asleep faster. Consider making a quiet activity, such as reading, part of your nighttime routine. . Try to avoid taking naps during the day so that you sleep better at night. . Get out of bed if you are still awake after 15 minutes of trying to sleep. Keep the lights down, but try reading or doing a quiet activity. When you feel sleepy, go back to bed.  Thrombophilia ([place]). Adam will continue Eliquis as directed.   Pain in both knees, unspecified chronicity. Aleathea was advised to avoid pounding exercises.  Essential hypertension. Lillah is working on healthy weight loss and exercise to improve blood pressure control. We will watch for signs of hypotension as she continues her lifestyle modifications. She will continue her medication as directed.   Hypercholesteremia. Lipid Panel With LDL/HDL Ratio will be checked today. She will work on diet and exercise.  Vitamin D deficiency. Low Vitamin D level contributes to fatigue and are associated with obesity, breast, and colon cancer. VITAMIN D 25 Hydroxy (Vit-D Deficiency, Fractures) level will be checked today.  Other irritable bowel syndrome. Will follow over  time.  Elevated glucose.  Comprehensive metabolic panel, Insulin, random, Hemoglobin A1c labs will be checked today.   Depression screening. Ondria had a positive depression screening. Depression is commonly associated with obesity and often results in emotional eating behaviors. We will monitor this closely and work on CBT to help improve the non-hunger eating patterns. Referral to Psychology may be required if no improvement is seen as she continues in our clinic.  Class 2 severe obesity with serious comorbidity and body mass index (BMI) of 35.0 to 35.9 in adult, unspecified obesity type ([place]).  Michele is currently in the action stage of change and her goal is to continue with weight  loss efforts. I recommend Alenna begin the structured treatment plan as follows:  She has agreed to the Category 2 Plan.  She will work on meal planning, intentional eating, and will stop all sugary drinks.  We independently reviewed with the patient labs from 07/15/2019 including CMP, lipids, Vitamin D, B12, and insulin.  Exercise goals: Older adults should follow the adult guidelines. When older adults cannot meet the adult guidelines, they should be as physically active as their abilities and conditions will allow.    Behavioral modification strategies: increasing lean protein intake, decreasing simple carbohydrates, increasing vegetables, increasing water intake, decreasing eating out, no skipping meals, meal planning and cooking strategies, keeping healthy foods in the home and planning for success.  She was informed of the importance of frequent follow-up visits to maximize her success with intensive lifestyle modifications for her multiple health conditions. She was informed we would discuss her lab results at her next visit unless there is a critical issue that needs to be addressed sooner. Erielle agreed to keep her next visit at the agreed upon time to discuss these results.  Objective:   Blood pressure (!) 140/80, pulse 45, temperature 98.3 F (36.8 C), height 5\' 6"  (1.676 m), weight (!) 213 lb (96.6 kg), SpO2 95 %. Body mass index is 34.38 kg/m.  EKG: Marked sinus  Bradycardia with a rate of 46 BPM. Poor R-wave progression - nonspecific. Borderline.   Indirect Calorimeter completed today shows a VO2 of 247 and a REE of 1717.  Her calculated basal metabolic rate is 6384 thus her basal metabolic rate is better than expected.  General: Cooperative, alert, well developed, in no acute distress. HEENT: Conjunctivae and lids unremarkable. Cardiovascular: Regular rhythm.  Lungs: Normal work of breathing. Neurologic: No focal deficits.   Lab Results  Component Value Date   CREATININE  1.02 (H) 07/15/2019   BUN 20 07/15/2019   NA 141 07/15/2019   K 4.4 07/15/2019   CL 102 07/15/2019   CO2 22 07/15/2019   Lab Results  Component Value Date   ALT 13 07/15/2019   AST 14 07/15/2019   ALKPHOS 105 07/15/2019   BILITOT 0.4 07/15/2019   No results found for: HGBA1C No results found for: INSULIN Lab Results  Component Value Date   TSH 1.193 04/01/2019   Lab Results  Component Value Date   CHOL 257 (H) 07/15/2019   HDL 66 07/15/2019   LDLCALC 163 (H) 07/15/2019   TRIG 158 (H) 07/15/2019   CHOLHDL 3.6 05/31/2018   Lab Results  Component Value Date   WBC 8.8 04/01/2019   HGB 13.0 04/01/2019   HCT 38.4 04/01/2019   MCV 90.8 04/01/2019   PLT 274 04/01/2019   No results found for: IRON, TIBC, FERRITIN  Obesity Behavioral Intervention Visit Documentation for Insurance:   Approximately 15 minutes  were spent on the discussion below.  ASK: We discussed the diagnosis of obesity with Afomia today and Yury agreed to give [name] permission to discuss obesity behavioral modification therapy today.  ASSESS: Ellington has the diagnosis of obesity and her BMI today is 35.5. Ysabela is in the action stage of change.   ADVISE: Darnella was educated on the multiple health risks of obesity as well as the benefit of weight loss to improve her health. She was advised of the need for long term treatment and the importance of lifestyle modifications to improve her current health and to decrease her risk of future health problems.  AGREE: Multiple dietary modification options and treatment options were discussed and Sandrea agreed to follow the recommendations documented in the above note.  ARRANGE: Dierdre was educated on the importance of frequent visits to treat obesity as outlined per CMS and USPSTF guidelines and agreed to schedule her next follow up appointment today.  Attestation Statements:   Reviewed by clinician on day of visit: allergies, medications, problem list, medical history,  surgical history, family history, social history, and previous encounter notes.  Migdalia Dk, am acting as Location manager for [employer], DO   I have reviewed the above documentation for accuracy and completeness, and I agree with the above. Jearld Lesch, DO

## 2019-12-10 ENCOUNTER — Encounter (INDEPENDENT_AMBULATORY_CARE_PROVIDER_SITE_OTHER): Payer: Self-pay | Admitting: Bariatrics

## 2019-12-10 LAB — COMPREHENSIVE METABOLIC PANEL
ALT: 11 IU/L (ref 0–32)
AST: 18 IU/L (ref 0–40)
Albumin/Globulin Ratio: 1.7 (ref 1.2–2.2)
Albumin: 4.3 g/dL (ref 3.7–4.7)
Alkaline Phosphatase: 106 IU/L (ref 48–121)
BUN/Creatinine Ratio: 24 (ref 12–28)
BUN: 20 mg/dL (ref 8–27)
Bilirubin Total: 0.4 mg/dL (ref 0.0–1.2)
CO2: 23 mmol/L (ref 20–29)
Calcium: 9.3 mg/dL (ref 8.7–10.3)
Chloride: 103 mmol/L (ref 96–106)
Creatinine, Ser: 0.85 mg/dL (ref 0.57–1.00)
GFR calc Af Amer: 75 mL/min/{1.73_m2} (ref >59)
GFR calc non Af Amer: 65 mL/min/{1.73_m2} (ref >59)
Globulin, Total: 2.5 g/dL (ref 1.5–4.5)
Glucose: 94 mg/dL (ref 65–99)
Potassium: 4.7 mmol/L (ref 3.5–5.2)
Sodium: 143 mmol/L (ref 134–144)
Total Protein: 6.8 g/dL (ref 6.0–8.5)

## 2019-12-10 LAB — TSH: TSH: 0.5 u[IU]/mL (ref 0.450–4.500)

## 2019-12-10 LAB — HEMOGLOBIN A1C
Est. average glucose Bld gHb Est-mCnc: 111 mg/dL
Hgb A1c MFr Bld: 5.5 % (ref 4.8–5.6)

## 2019-12-10 LAB — INSULIN, RANDOM: INSULIN: 10.4 u[IU]/mL (ref 2.6–24.9)

## 2019-12-10 LAB — LIPID PANEL WITH LDL/HDL RATIO
Cholesterol, Total: 260 mg/dL — ABNORMAL HIGH (ref 100–199)
HDL: 73 mg/dL (ref >39)
LDL Chol Calc (NIH): 168 mg/dL — ABNORMAL HIGH (ref 0–99)
LDL/HDL Ratio: 2.3 ratio (ref 0.0–3.2)
Triglycerides: 112 mg/dL (ref 0–149)
VLDL Cholesterol Cal: 19 mg/dL (ref 5–40)

## 2019-12-10 LAB — T3: T3, Total: 80 ng/dL (ref 71–180)

## 2019-12-10 LAB — T4, FREE: Free T4: 1.74 ng/dL (ref 0.82–1.77)

## 2019-12-10 LAB — VITAMIN D 25 HYDROXY (VIT D DEFICIENCY, FRACTURES): Vit D, 25-Hydroxy: 24.4 ng/mL — ABNORMAL LOW (ref 30.0–100.0)

## 2019-12-12 ENCOUNTER — Telehealth: Payer: Self-pay | Admitting: *Deleted

## 2019-12-12 ENCOUNTER — Other Ambulatory Visit: Payer: Self-pay | Admitting: Cardiovascular Disease

## 2019-12-12 NOTE — Chronic Care Management (AMB) (Signed)
  Chronic Care Management   Note  12/12/2019 Name: Lauren Lloyd MRN: [id number] DOB: Oct 16, 1940  Lauren Lloyd is a 79 y.o. year old female who is a primary care patient of Bacigalupo, Dionne Bucy, MD. I reached out to Lauren Lloyd by phone today in response to a referral sent by [name]'s health plan.     [name] was given information about Chronic Care Management services today including:  1. CCM service includes personalized support from designated clinical staff supervised by her physician, including individualized plan of care and coordination with other care providers 2. 24/7 contact phone numbers for assistance for urgent and routine care needs. 3. Service will only be billed when office clinical staff spend 20 minutes or more in a month to coordinate care. 4. Only one practitioner may furnish and bill the service in a calendar month. 5. The patient may stop CCM services at any time (effective at the end of the month) by phone call to the office staff. 6. The patient will be responsible for cost sharing (co-pay) of up to 20% of the service fee (after annual deductible is met).  Patient agreed to services and verbal consent obtained.   Follow up plan: Telephone appointment with care management team member scheduled for:12/30/2019  [place], [place] [facility]  [place], [place] [id number] Direct Dial: [email or web address] Website: [email or web address]

## 2019-12-13 NOTE — Telephone Encounter (Signed)
Prescription refill request for Eliquis received.  Last office visit: [place], 08/26/2019 Scr: 0.85, 12/09/2019 Age:  79 y.o. Weight: 96.6 kg   Prescription refill sent.

## 2019-12-13 NOTE — Telephone Encounter (Signed)
Refill Request.  

## 2019-12-17 DIAGNOSIS — M48062 Spinal stenosis, lumbar region with neurogenic claudication: Secondary | ICD-10-CM | POA: Diagnosis not present

## 2019-12-17 DIAGNOSIS — M5416 Radiculopathy, lumbar region: Secondary | ICD-10-CM | POA: Diagnosis not present

## 2019-12-17 DIAGNOSIS — M5136 Other intervertebral disc degeneration, lumbar region: Secondary | ICD-10-CM | POA: Diagnosis not present

## 2019-12-23 ENCOUNTER — Other Ambulatory Visit: Payer: Self-pay

## 2019-12-23 ENCOUNTER — Ambulatory Visit (INDEPENDENT_AMBULATORY_CARE_PROVIDER_SITE_OTHER): Payer: Medicare HMO | Admitting: Bariatrics

## 2019-12-23 ENCOUNTER — Encounter (INDEPENDENT_AMBULATORY_CARE_PROVIDER_SITE_OTHER): Payer: Self-pay | Admitting: Family Medicine

## 2019-12-23 ENCOUNTER — Ambulatory Visit (INDEPENDENT_AMBULATORY_CARE_PROVIDER_SITE_OTHER): Payer: Medicare HMO | Admitting: Family Medicine

## 2019-12-23 VITALS — BP 138/48 | HR 43 | Temp 98.2°F | Ht 66.0 in | Wt 211.0 lb

## 2019-12-23 DIAGNOSIS — E669 Obesity, unspecified: Secondary | ICD-10-CM | POA: Diagnosis not present

## 2019-12-23 DIAGNOSIS — Z6834 Body mass index (BMI) 34.0-34.9, adult: Secondary | ICD-10-CM

## 2019-12-23 DIAGNOSIS — I10 Essential (primary) hypertension: Secondary | ICD-10-CM | POA: Diagnosis not present

## 2019-12-23 DIAGNOSIS — E559 Vitamin D deficiency, unspecified: Secondary | ICD-10-CM | POA: Diagnosis not present

## 2019-12-23 DIAGNOSIS — E7849 Other hyperlipidemia: Secondary | ICD-10-CM | POA: Diagnosis not present

## 2019-12-23 DIAGNOSIS — E8881 Metabolic syndrome: Secondary | ICD-10-CM

## 2019-12-23 MED ORDER — VITAMIN D (ERGOCALCIFEROL) 1.25 MG (50000 UNIT) PO CAPS: 50000.0000 [IU] | ORAL_CAPSULE | ORAL | 0 refills | Status: DC

## 2019-12-23 NOTE — Progress Notes (Signed)
Chief Complaint:   OBESITY Lauren Lloyd is here to discuss her progress with her obesity treatment plan along with follow-up of her obesity related diagnoses. Lauren Lloyd is on the Category 2 Plan and states she is following her eating plan approximately 96% of the time. Lauren Lloyd states she is swimming and walking in the pool for 45 minutes 3 times per week.  Today's visit was #: 2 Starting weight: 213 lbs Starting date: 12/09/2019 Today's weight: 211 lbs Today's date: 12/23/2019 Total lbs lost to date: 2 lbs Total lbs lost since last in-office visit: 2 lbs  Interim History: This is Lauren Lloyd's first office visit.  She saw Dr. Owens Shark for her first office visit.  She says she usually has 2 glasses of wine most nights, but rarely drank.  She feels she has really deprived herself and is disappointed with her weight loss.  She ate out 4 times in the past 2 weeks.  She ate fried calamari, caesar salads, and ate off plan.  She also admits to not measuring proteins at all and is likely not getting all in.  For snack calories, she is having rice cakes.  She eats everything on plan.  She is going to [place] in 4 weeks.  She will be going for 10 days.  Subjective:   1. Essential hypertension Review: taking medications as instructed, no medication side effects noted, no chest pain on exertion, no dyspnea on exertion, no swelling of ankles.  Denies symptoms or concerns.  Blood pressure is controlled.  Medication management per Cardiology.  She is taking amiodarone, Zebeta, Lasix, and Klor-Con.  BP Readings from Last 3 Encounters:  12/23/19 (!) 138/48  12/09/19 (!) 140/80  11/08/19 110/62   2. Other hyperlipidemia Lauren Lloyd has hyperlipidemia and has been trying to improve her cholesterol levels with intensive lifestyle modification including a low saturated fat diet, exercise and weight loss. She denies any chest pain, claudication or myalgias.  She sees Dr. Rockey Situ of Cardiology.  She declined them multiple times in the  past.  Lab Results  Component Value Date   ALT 11 12/09/2019   AST 18 12/09/2019   ALKPHOS 106 12/09/2019   BILITOT 0.4 12/09/2019   Lab Results  Component Value Date   CHOL 260 (H) 12/09/2019   HDL 73 12/09/2019   LDLCALC 168 (H) 12/09/2019   TRIG 112 12/09/2019   CHOLHDL 3.6 05/31/2018   3. Insulin resistance Lauren Lloyd has a diagnosis of insulin resistance based on her elevated fasting insulin level >5. She continues to work on diet and exercise to decrease her risk of diabetes.  Lab Results  Component Value Date   INSULIN 10.4 12/09/2019   Lab Results  Component Value Date   HGBA1C 5.5 12/09/2019   4. Vitamin D deficiency Lauren Lloyd Vitamin D level was 24.4 on 12/09/2019. She is currently taking a multivitamin daily. She denies nausea, vomiting or muscle weakness.  She was never told she needs a medication, but she was told 5 years ago that she was deficient and she started an OTC multivitamin.  She takes it daily.  Assessment/Plan:    1. Essential hypertension Discussed labs with patient today, counseling done and all questions were answered.  Lauren Lloyd is working on healthy weight loss and exercise to improve blood pressure control. We will watch for signs of hypotension as she continues her lifestyle modifications.  Medication management per Cardiology.  Labs stable.  prudent nutritional plan of low salt and weight loss.   2. Other hyperlipidemia  Discussed labs with patient today, counseling done and all questions were answered.   Cardiovascular risk and specific lipid/LDL goals reviewed.  We discussed several lifestyle modifications today and Lauren Lloyd will continue to work on diet, exercise and weight loss efforts. Orders and follow up as documented in patient record.  Treatment plan and medication management per Cardiology.  prudent nutritional plan and weight loss.  Exercise.  She declined medication today and will follow-up with her cardiologist.  Counseling Intensive lifestyle  modifications are the first line treatment for this issue. . Dietary changes: Increase soluble fiber. Decrease simple carbohydrates. . Exercise changes: Moderate to vigorous-intensity aerobic activity 150 minutes per week if tolerated. . Lipid-lowering medications: see documented in medical record.   3. Insulin resistance New Discussed labs with patient today, counseling done and all questions were answered.  Will continue to work on weight loss, exercise, and decreasing simple carbohydrates to help decrease the risk of diabetes. Lauren Lloyd agreed to follow-up with [name] as directed to closely monitor her progress.  Handout given after extensive discussion with her about new diagnosis.  Increase protein and measure (which she is not doing now), prudent nutritional plan, weight loss.   4. Vitamin D deficiency New  Discussed labs with patient today, counseling done and all questions were answered.   Low Vitamin D level contributes to fatigue and are associated with obesity, breast, and colon cancer. She agrees to continue to take prescription Vitamin D @50 ,000 IU every week and will follow-up for routine testing of Vitamin D, at least 2-3 times per year to avoid over-replacement.  Continue prudent nutritional plan, weight loss.  Recheck vitamin D level in 3 months. -START Vitamin D, Ergocalciferol, (DRISDOL) 1.25 MG (50000 UNIT) CAPS capsule; Take 1 capsule (50,000 Units total) by mouth every 7 (seven) days.  Dispense: 4 capsule; Refill: 0   5. Class 1 obesity with serious comorbidity and body mass index (BMI) of 34.0 to 34.9 in adult, unspecified obesity type Lauren Lloyd is currently in the action stage of change. As such, her goal is to continue with weight loss efforts. She has agreed to the Category 2 Plan.   Exercise goals: As is.  Behavioral modification strategies: increasing lean protein intake, decreasing eating out, meal planning and cooking strategies and planning for success.  Lauren Lloyd has agreed  to follow-up with our clinic in 2 weeks. She was informed of the importance of frequent follow-up visits to maximize her success with intensive lifestyle modifications for her multiple health conditions.    Objective:   Blood pressure (!) 138/48, pulse (!) 43, temperature 98.2 F (36.8 C), height 5\' 6"  (1.676 m), weight 211 lb (95.7 kg), SpO2 97 %. Body mass index is 34.06 kg/m.  General: Cooperative, alert, well developed, in no acute distress. HEENT: Conjunctivae and lids unremarkable. Cardiovascular: Regular rhythm.  Lungs: Normal work of breathing. Neurologic: No focal deficits.   Lab Results  Component Value Date   CREATININE 0.85 12/09/2019   BUN 20 12/09/2019   NA 143 12/09/2019   K 4.7 12/09/2019   CL 103 12/09/2019   CO2 23 12/09/2019   Lab Results  Component Value Date   ALT 11 12/09/2019   AST 18 12/09/2019   ALKPHOS 106 12/09/2019   BILITOT 0.4 12/09/2019   Lab Results  Component Value Date   HGBA1C 5.5 12/09/2019   Lab Results  Component Value Date   INSULIN 10.4 12/09/2019   Lab Results  Component Value Date   TSH 0.500 12/09/2019   Lab  Results  Component Value Date   CHOL 260 (H) 12/09/2019   HDL 73 12/09/2019   LDLCALC 168 (H) 12/09/2019   TRIG 112 12/09/2019   CHOLHDL 3.6 05/31/2018   Lab Results  Component Value Date   WBC 8.8 04/01/2019   HGB 13.0 04/01/2019   HCT 38.4 04/01/2019   MCV 90.8 04/01/2019   PLT 274 04/01/2019   Obesity Behavioral Intervention Documentation for Insurance:   Approximately 15 minutes were spent on the discussion below.  ASK: We discussed the diagnosis of obesity with Lauren Lloyd today and Lauren Lloyd agreed to give [name] permission to discuss obesity behavioral modification therapy today.  ASSESS: Lauren Lloyd has the diagnosis of obesity and her BMI today is 34.1. Lauren Lloyd is in the action stage of change.   ADVISE: Lauren Lloyd was educated on the multiple health risks of obesity as well as the benefit of weight loss to improve  her health. She was advised of the need for long term treatment and the importance of lifestyle modifications to improve her current health and to decrease her risk of future health problems.  AGREE: Multiple dietary modification options and treatment options were discussed and Lauren Lloyd agreed to follow the recommendations documented in the above note.  ARRANGE: Lauren Lloyd was educated on the importance of frequent visits to treat obesity as outlined per CMS and USPSTF guidelines and agreed to schedule her next follow up appointment today.  Attestation Statements:   Reviewed by clinician on day of visit: allergies, medications, problem list, medical history, surgical history, family history, social history, and previous encounter notes.  I, Water quality scientist, CMA, am acting as Location manager for [employer], DO.  I have reviewed the above documentation for accuracy and completeness, and I agree with the above. -  [name], DO

## 2019-12-30 ENCOUNTER — Other Ambulatory Visit: Payer: Self-pay

## 2019-12-30 ENCOUNTER — Ambulatory Visit: Payer: Medicare HMO | Admitting: Pharmacist

## 2019-12-30 DIAGNOSIS — E7800 Pure hypercholesterolemia, unspecified: Secondary | ICD-10-CM

## 2019-12-30 DIAGNOSIS — I10 Essential (primary) hypertension: Secondary | ICD-10-CM

## 2019-12-30 NOTE — Chronic Care Management (AMB) (Signed)
Chronic Care Management Pharmacy  Name: Lauren Lloyd  MRN: [id number] DOB: 15-Sep-1940  Chief Complaint/ HPI  Lauren Lloyd,  79 y.o. , female presents for their Initial CCM visit with the clinical pharmacist via telephone due to COVID-19 Pandemic VitD 24.4  PCP : [name], MD  Their chronic conditions include: Afib, HTN, HLD  Office Visits: 6/25 dizziness, Bacigalupo, BP 110/62 P 40 wt 216 BMI 33.83, chronic dizziness, waxing and waning, CPAP non-adherent, wt referred to MNT, Dec bisoprolol 2.65m bid, knee inj today,   Consult Visit: 8/9 HTN, [place], BP 138/48 P 43, Wt 211 BMI 34.1, diet nonadherent, LDL 168H, A1c 5.5%  Medications: Outpatient Encounter Medications as of 12/30/2019  Medication Sig  . amiodarone (PACERONE) 100 MG tablet Take 1 tablet (100 mg total) by mouth daily.  . bisoprolol (ZEBETA) 5 MG tablet Take 0.5 tablets (2.5 mg total) by mouth 2 (two) times daily.  .[name] KitchenELIQUIS 5 MG TABS tablet TAKE ONE TABLET BY MOUTH TWICE DAILY  . furosemide (LASIX) 40 MG tablet Take 1 tablet (40 mg total) by mouth 2 (two) times daily. Take twice a day anytime weight is 218 or higher. (Patient taking differently: Take 40 mg by mouth daily. Take twice a day anytime weight is 218 or higher.)  . gabapentin (NEURONTIN) 300 MG capsule Take 300 mg by mouth daily.  .[name] Kitchenloperamide (IMODIUM) 2 MG capsule Take 2 mg by mouth as needed.   . Multiple Vitamin (MULTIVITAMIN) capsule Take 1 capsule by mouth daily.  . potassium chloride (KLOR-CON) 10 MEQ tablet Take 1 tablet (10 mEq total) by mouth daily.  . Vitamin D, Ergocalciferol, (DRISDOL) 1.25 MG (50000 UNIT) CAPS capsule Take 1 capsule (50,000 Units total) by mouth every 7 (seven) days.  . metaxalone (SKELAXIN) 800 MG tablet Take 800 mg by mouth 3 (three) times daily. (Patient not taking: Reported on 12/30/2019)   No facility-administered encounter medications on file as of 12/30/2019.      Financial Resource Strain: Low Risk   .  Difficulty of Paying Living Expenses: Not very hard    Current Diagnosis/Assessment:  Goals Addressed            This Visit's Progress   . Chronic Care Management       CARE PLAN ENTRY (see longitudinal plan of care for additional care plan information)  Current Barriers:  . Chronic Disease Management support, education, and care coordination needs related to Hypertension, Hyperlipidemia, and Atrial Fibrillation   Hypertension BP Readings from Last 3 Encounters:  12/23/19 (!) 138/48  12/09/19 (!) 140/80  11/08/19 110/62   . Pharmacist Clinical Goal(s): o Over the next 90 days, patient will work with PharmD and providers to maintain BP goal <140/90 . Current regimen:  o Bisprolol 2.518mtwice daily . Interventions: o None . Patient self care activities - Over the next 90 days, patient will: o Check BP weekly, document, and provide at future appointments o Ensure daily salt intake < 2300 mg/day  Hyperlipidemia Lab Results  Component Value Date/Time   LDLCALC 168 (H) 12/09/2019 02:11 PM   . Pharmacist Clinical Goal(s): o Over the next 90 days, patient will work with PharmD and providers to achieve LDL goal < 100 . Current regimen:  o None . Interventions: o None . Patient self care activities - Over the next 90 days, patient will: o Increase lifestyle modifications to lower cholesterol  Chronic Pain . Pharmacist Clinical Goal(s) o Over the next 90 days, patient will  work with PharmD and providers to reduce pain . Current regimen:  o Gabapentin 318m daily . Interventions: o None . Patient self care activities - Over the next 90 days, patient will: o Report pain level to PharmD and provider o Provide feedback on any medication changes to reduce pain  Medication management . Pharmacist Clinical Goal(s): o Over the next 90 days, patient will work with PharmD and providers to maintain optimal medication adherence . Current pharmacy:  [facility] . Interventions o Comprehensive medication review performed. o Continue current medication management strategy . Patient self care activities - Over the next 90 days, patient will: o Focus on medication adherence by continuing current practices o Take medications as prescribed o Report any questions or concerns to PharmD and/or provider(s)  Initial goal documentation       Hyperlipidemia   LDL goal < 100  Lipid Panel     Component Value Date/Time   CHOL 260 (H) 12/09/2019 1411   TRIG 112 12/09/2019 1411   HDL 73 12/09/2019 1411   LDLCALC 168 (H) 12/09/2019 1411    Hepatic Function Latest Ref Rng & Units 12/09/2019 07/15/2019 04/01/2019  Total Protein 6.0 - 8.5 g/dL 6.8 6.2 7.0  Albumin 3.7 - 4.7 g/dL 4.3 4.1 4.0  AST 0 - 40 IU/L '18 14 23  ' ALT 0 - 32 IU/L '11 13 27  ' Alk Phosphatase 48 - 121 IU/L 106 105 81  Total Bilirubin 0.0 - 1.2 mg/dL 0.4 0.4 0.8     Patient has failed these meds in past: NA Patient is currently uncontrolled on the following medications:  . None  We discussed: New statin start at age 89not evidence based Not diet adherent  Plan  Continue control with diet and exercise  Hypertension   BP goal is:  <140/90  Office blood pressures are  BP Readings from Last 3 Encounters:  12/23/19 (!) 138/48  12/09/19 (!) 140/80  11/08/19 110/62   Patient checks BP at home infrequently Patient home BP readings are ranging: NA   Patient has failed these meds in the past: NA Patient is currently controlled on the following medications:  . Bisoprolol 2.548mtwice daily  We discussed  Furosemide effective. Occasionally needs second dose. Eliquis $386/3 months Lethargic on amiodarone Bradycardia better with dec dose bisoprolol NAC 50081maily  Sever DOE Imodium ever 2 - 3 days abuterol rescue inhaler monthly  Plan  Continue current medications   Chronic Pain   Patient has failed these meds in past: NA Patient is currently uncontrolled  on the following medications:  . Gabapentin 300m59mily  We discussed: Gabapentin not helpful with back pain  Plan  Continue current medications   Medication Management   Pt uses [facility] all medications Uses pill box? Yes Pt endorses 100% compliance   We discussed:  Daughter in law is [employer], research (physical sciences)t interested in switching.  Plan  Continue current medication management strategy  Follow up: 3 month phone visit  Donnica Jarnagin Milus HeightarmD, BCGPNorton [phone]

## 2020-01-01 DIAGNOSIS — Z96651 Presence of right artificial knee joint: Secondary | ICD-10-CM | POA: Diagnosis not present

## 2020-01-01 DIAGNOSIS — M1712 Unilateral primary osteoarthritis, left knee: Secondary | ICD-10-CM | POA: Diagnosis not present

## 2020-01-01 DIAGNOSIS — K219 Gastro-esophageal reflux disease without esophagitis: Secondary | ICD-10-CM | POA: Diagnosis not present

## 2020-01-01 NOTE — Patient Instructions (Addendum)
Visit Information  Goals Addressed            This Visit's Progress   . Chronic Care Management       CARE PLAN ENTRY (see longitudinal plan of care for additional care plan information)  Current Barriers:  . Chronic Disease Management support, education, and care coordination needs related to Hypertension, Hyperlipidemia, and Atrial Fibrillation   Hypertension BP Readings from Last 3 Encounters:  12/23/19 (!) 138/48  12/09/19 (!) 140/80  11/08/19 110/62   . Pharmacist Clinical Goal(s): o Over the next 90 days, patient will work with PharmD and providers to maintain BP goal <140/90 . Current regimen:  o Bisprolol 2.5mg  twice daily . Interventions: o None . Patient self care activities - Over the next 90 days, patient will: o Check BP weekly, document, and provide at future appointments o Ensure daily salt intake < 2300 mg/day  Hyperlipidemia Lab Results  Component Value Date/Time   LDLCALC 168 (H) 12/09/2019 02:11 PM   . Pharmacist Clinical Goal(s): o Over the next 90 days, patient will work with PharmD and providers to achieve LDL goal < 100 . Current regimen:  o None . Interventions: o None . Patient self care activities - Over the next 90 days, patient will: o Increase lifestyle modifications to lower cholesterol  Chronic Pain . Pharmacist Clinical Goal(s) o Over the next 90 days, patient will work with PharmD and providers to reduce pain . Current regimen:  o Gabapentin 300mg  daily . Interventions: o None . Patient self care activities - Over the next 90 days, patient will: o Report pain level to PharmD and provider o Provide feedback on any medication changes to reduce pain  Medication management . Pharmacist Clinical Goal(s): o Over the next 90 days, patient will work with PharmD and providers to maintain optimal medication adherence . Current pharmacy: [facility] . Interventions o Comprehensive medication review performed. o Continue current  medication management strategy . Patient self care activities - Over the next 90 days, patient will: o Focus on medication adherence by continuing current practices o Take medications as prescribed o Report any questions or concerns to PharmD and/or provider(s)  Initial goal documentation        Lauren Lloyd was given information about Chronic Care Management services today including:  1. CCM service includes personalized support from designated clinical staff supervised by her physician, including individualized plan of care and coordination with other care providers 2. 24/7 contact phone numbers for assistance for urgent and routine care needs. 3. Standard insurance, coinsurance, copays and deductibles apply for chronic care management only during months in which we provide at least 20 minutes of these services. Most insurances cover these services at 100%, however patients may be responsible for any copay, coinsurance and/or deductible if applicable. This service may help you avoid the need for more expensive face-to-face services. 4. Only one practitioner may furnish and bill the service in a calendar month. 5. The patient may stop CCM services at any time (effective at the end of the month) by phone call to the office staff.  Patient agreed to services and verbal consent obtained.   Print copy of patient instructions provided.  Telephone follow up appointment with pharmacy team member scheduled for: 3 months  Milus Height, PharmD, [place], [place] [phone]      Arthritis Arthritis is a term that is commonly used to refer to joint pain or joint disease. There are more than 100 types of  arthritis. What are the causes? The most common cause of this condition is wear and tear of a joint. Other causes include:  Gout.  Inflammation of a joint.  An infection of a joint.  Sprains and other injuries near the joint.  A reaction to  medicines or drugs, or an allergic reaction. In some cases, the cause may not be known. What are the signs or symptoms? The main symptom of this condition is pain in the joint during movement. Other symptoms include:  Redness, swelling, or stiffness at a joint.  Warmth coming from the joint.  Fever.  Overall feeling of illness. How is this diagnosed? This condition may be diagnosed with a physical exam and tests, including:  Blood tests.  Urine tests.  Imaging tests, such as X-rays, an MRI, or a CT scan. Sometimes, fluid is removed from a joint for testing. How is this treated? This condition may be treated with:  Treatment of the cause, if it is known.  Rest.  Raising (elevating) the joint.  Applying cold or hot packs to the joint.  Medicines to improve symptoms and reduce inflammation.  Injections of a steroid such as cortisone into the joint to help reduce pain and inflammation. Depending on the cause of your arthritis, you may need to make lifestyle changes to reduce stress on your joint. Changes may include:  Exercising more.  Losing weight. Follow these instructions at home: Medicines  Take over-the-counter and prescription medicines only as told by your health care provider.  Do not take aspirin to relieve pain if your health care provider thinks that gout may be causing your pain. Activity  Rest your joint if told by your health care provider. Rest is important when your disease is active and your joint feels painful, swollen, or stiff.  Avoid activities that make the pain worse. It is important to balance activity with rest.  Exercise your joint regularly with range-of-motion exercises as told by your health care provider. Try doing low-impact exercise, such as: ? Swimming. ? Water aerobics. ? Biking. ? Walking. Managing pain, stiffness, and swelling      If directed, put ice on the joint. ? Put ice in a plastic bag. ? Place a towel between  your skin and the bag. ? Leave the ice on for 20 minutes, 2-3 times per day.  If your joint is swollen, raise (elevate) it above the level of your heart if directed by your health care provider.  If your joint feels stiff in the morning, try taking a warm shower.  If directed, apply heat to the affected area as often as told by your health care provider. Use the heat source that your health care provider recommends, such as a moist heat pack or a heating pad. If you have diabetes, do not apply heat without permission from your health care provider. To apply heat: ? Place a towel between your skin and the heat source. ? Leave the heat on for 20-30 minutes. ? Remove the heat if your skin turns bright red. This is especially important if you are unable to feel pain, heat, or cold. You may have a greater risk of getting burned. General instructions  Do not use any products that contain nicotine or tobacco, such as cigarettes, e-cigarettes, and chewing tobacco. If you need help quitting, ask your health care provider.  Keep all follow-up visits as told by your health care provider. This is important. Contact a health care provider if:  The pain gets  worse.  You have a fever. Get help right away if:  You develop severe joint pain, swelling, or redness.  Many joints become painful and swollen.  You develop severe back pain.  You develop severe weakness in your leg.  You cannot control your bladder or bowels. Summary  Arthritis is a term that is commonly used to refer to joint pain or joint disease. There are more than 100 types of arthritis.  The most common cause of this condition is wear and tear of a joint. Other causes include gout, inflammation or infection of the joint, sprains, or allergies.  Symptoms of this condition include redness, swelling, or stiffness of the joint. Other symptoms include warmth, fever, or feeling ill.  This condition is treated with rest, elevation,  medicines, and applying cold or hot packs.  Follow your health care provider's instructions about medicines, activity, exercises, and other home care treatments. This information is not intended to replace advice given to you by your health care provider. Make sure you discuss any questions you have with your health care provider. Document Revised: 04/09/2018 Document Reviewed: 04/09/2018 Elsevier Patient Education  2020 [employer].

## 2020-01-02 DIAGNOSIS — R69 Illness, unspecified: Secondary | ICD-10-CM | POA: Diagnosis not present

## 2020-01-05 DIAGNOSIS — G4733 Obstructive sleep apnea (adult) (pediatric): Secondary | ICD-10-CM | POA: Diagnosis not present

## 2020-01-06 ENCOUNTER — Ambulatory Visit (INDEPENDENT_AMBULATORY_CARE_PROVIDER_SITE_OTHER): Payer: Medicare HMO | Admitting: Family Medicine

## 2020-01-06 ENCOUNTER — Telehealth: Payer: Self-pay | Admitting: Cardiovascular Disease

## 2020-01-06 ENCOUNTER — Encounter (INDEPENDENT_AMBULATORY_CARE_PROVIDER_SITE_OTHER): Payer: Self-pay | Admitting: Family Medicine

## 2020-01-06 ENCOUNTER — Other Ambulatory Visit: Payer: Self-pay

## 2020-01-06 VITALS — BP 112/50 | HR 45 | Temp 98.1°F | Ht 66.0 in | Wt 211.0 lb

## 2020-01-06 DIAGNOSIS — Z6834 Body mass index (BMI) 34.0-34.9, adult: Secondary | ICD-10-CM | POA: Diagnosis not present

## 2020-01-06 DIAGNOSIS — E8881 Metabolic syndrome: Secondary | ICD-10-CM | POA: Diagnosis not present

## 2020-01-06 DIAGNOSIS — E559 Vitamin D deficiency, unspecified: Secondary | ICD-10-CM | POA: Diagnosis not present

## 2020-01-06 DIAGNOSIS — E669 Obesity, unspecified: Secondary | ICD-10-CM

## 2020-01-06 DIAGNOSIS — I10 Essential (primary) hypertension: Secondary | ICD-10-CM

## 2020-01-06 MED ORDER — VITAMIN D (ERGOCALCIFEROL) 1.25 MG (50000 UNIT) PO CAPS: 50000.0000 [IU] | ORAL_CAPSULE | ORAL | 0 refills | Status: DC

## 2020-01-06 NOTE — Progress Notes (Signed)
Chief Complaint:   OBESITY Lauren Lloyd is here to discuss her progress with her obesity treatment plan along with follow-up of her obesity related diagnoses. Lauren Lloyd is on the Category 2 Plan and states she is following her eating plan approximately 80% of the time. Lauren Lloyd states she is waking in the pool for 45 minutes 2 times per week.  Today's visit was #: 3 Starting weight: 213 lbs Starting date: 12/09/2019 Today's weight: 211 lbs Today's date: 01/13/2020 Total lbs lost to date: 2 lbs Total lbs lost since last in-office visit: 0  Interim History: Lauren Lloyd says she is struggling with acute on chronic SX of "dizziness" and is being treated by her Cardiologist.  She describes her sx best as feels sleepy / groggy and "like she's having an out of body experience".    Symptoms occur only occasionally- no inc in severity or freq recently.   She is eating olive oil, butter, peanut butter, and not getting in all of her proteins.  She is leaving for [place] on September 7, and does not plan to eat " on plan" during that time.  She will drink wine and eat olive oil/butter then also.  We will see her prior to leaving.    Subjective:   1. Essential hypertension Review: taking medications as instructed, no medication side effects noted, no chest pain on exertion, no dyspnea on exertion, no swelling of ankles.  Blood pressure at home is running "all over the map"; sometimes low, sometimes higher.  BP Readings from Last 3 Encounters:  01/08/20 140/70  01/06/20 (!) 112/50  12/23/19 (!) 138/48   2. Insulin resistance Lauren Lloyd has a diagnosis of insulin resistance based on her elevated fasting insulin level >5. She continues to work on diet and exercise to decrease her risk of diabetes.  Lab Results  Component Value Date   INSULIN 10.4 12/09/2019   Lab Results  Component Value Date   HGBA1C 5.5 12/09/2019   3. Vitamin D deficiency Lauren Lloyd's Vitamin D level was 24.4 on 12/09/2019. She is currently taking  prescription vitamin D 50,000 IU each week. She denies nausea, vomiting or muscle weakness.    Assessment/Plan:   1. Essential hypertension Lauren Lloyd is working on healthy weight loss and exercise to improve blood pressure control. We will watch for signs of hypotension as she continues her lifestyle modifications.  Lauren Lloyd will call her Cardiology regarding changes to her medications if needed, continue to monitor blood pressure and heart rate.  Continue prudent nutritional plan, wt loss and low salt diet.  2. Insulin resistance Lauren Lloyd will continue to work on weight loss, exercise, and decreasing simple carbohydrates to help decrease the risk of diabetes. Lauren Lloyd agreed to follow-up with [name] as directed to closely monitor her progress.  Continue prudent nutritional plan and weight loss.  3. Vitamin D deficiency Low Vitamin D level contributes to fatigue and are associated with obesity, breast, and colon cancer. She agrees to continue to take prescription Vitamin D @50 ,000 IU every week and will follow-up for routine testing of Vitamin D, at least 2-3 times per year to avoid over-replacement.    - Refill Vitamin D, Ergocalciferol, (DRISDOL) 1.25 MG (50000 UNIT) CAPS capsule; Take 1 capsule (50,000 Units total) by mouth every 7 (seven) days.  Dispense: 4 capsule; Refill: 0  4. Class 1 obesity with serious comorbidity and body mass index (BMI) of 34.0 to 34.9 in adult, unspecified obesity type Lauren Lloyd is currently in the action stage of change. As such,  her goal is to continue with weight loss efforts. She has agreed to the Category 2 Plan.   Exercise goals: As is.  Behavioral modification strategies: increasing lean protein intake, decreasing simple carbohydrates, no skipping meals, meal planning, decreasing condiment calories, and cooking strategies and planning for success.  Lauren Lloyd has agreed to follow-up with our clinic in 2 weeks. She was informed of the importance of frequent follow-up visits to  maximize her success with intensive lifestyle modifications for her multiple health conditions.   Objective:   Blood pressure (!) 112/50, pulse (!) 45, temperature 98.1 F (36.7 C), height 5\' 6"  (1.676 m), weight 211 lb (95.7 kg), SpO2 96 %. Body mass index is 34.06 kg/m.  General: Cooperative, alert, well developed, in no acute distress. HEENT: Conjunctivae and lids unremarkable. Cardiovascular: Regular rhythm.  Lungs: Normal work of breathing. Neurologic: No focal deficits.   Lab Results  Component Value Date   CREATININE 0.85 12/09/2019   BUN 20 12/09/2019   NA 143 12/09/2019   K 4.7 12/09/2019   CL 103 12/09/2019   CO2 23 12/09/2019   Lab Results  Component Value Date   ALT 11 12/09/2019   AST 18 12/09/2019   ALKPHOS 106 12/09/2019   BILITOT 0.4 12/09/2019   Lab Results  Component Value Date   HGBA1C 5.5 12/09/2019   Lab Results  Component Value Date   INSULIN 10.4 12/09/2019   Lab Results  Component Value Date   TSH 0.500 12/09/2019   Lab Results  Component Value Date   CHOL 260 (H) 12/09/2019   HDL 73 12/09/2019   LDLCALC 168 (H) 12/09/2019   TRIG 112 12/09/2019   CHOLHDL 3.6 05/31/2018   Lab Results  Component Value Date   WBC 8.8 04/01/2019   HGB 13.0 04/01/2019   HCT 38.4 04/01/2019   MCV 90.8 04/01/2019   PLT 274 04/01/2019   Obesity Behavioral Intervention Documentation for Insurance:   Approximately 15 minutes were spent on the discussion below.  ASK: We discussed the diagnosis of obesity with Lauren Lloyd today and Lauren Lloyd agreed to give [name] permission to discuss obesity behavioral modification therapy today.  ASSESS: Lauren Lloyd has the diagnosis of obesity and her BMI today is 34.2. Lauren Lloyd is in the action stage of change.   ADVISE: Lauren Lloyd was educated on the multiple health risks of obesity as well as the benefit of weight loss to improve her health. She was advised of the need for long term treatment and the importance of lifestyle modifications to  improve her current health and to decrease her risk of future health problems.  AGREE: Multiple dietary modification options and treatment options were discussed and Lauren Lloyd agreed to follow the recommendations documented in the above note.  ARRANGE: Lauren Lloyd was educated on the importance of frequent visits to treat obesity as outlined per CMS and USPSTF guidelines and agreed to schedule her next follow up appointment today.  Attestation Statements:   Reviewed by clinician on day of visit: allergies, medications, problem list, medical history, surgical history, family history, social history, and previous encounter notes.  I, Water quality scientist, CMA, am acting as Location manager for [employer], DO.  I have reviewed the above documentation for accuracy and completeness, and I agree with the above. -  Lauren Dance, DO

## 2020-01-06 NOTE — Telephone Encounter (Signed)
STAT if HR is under 50 or over 120 (normal HR is 60-100 beats per minute)  1) What is your heart rate? 47  2) Do you have a log of your heart rate readings (document readings)?  Ranged from 18, 43,41  Do you have any other symptoms? Dizzy and "just about cant put one foot in front of the othe,  lethargic".

## 2020-01-06 NOTE — Telephone Encounter (Signed)
Spoke with patient and she saw her primary care provider. She states that she feels bad all the time and not able to do much either. She also states blood pressures are low which previously they were elevated. Blood pressure is 110/50. She states that heart rates normally run low but now her blood pressures are as well. Scheduled her to come in this week to see our APP. She verbalized understanding of our conversation, agreement with plan, and had no further questions at this time.

## 2020-01-06 NOTE — Telephone Encounter (Signed)
Patient returning call.

## 2020-01-06 NOTE — Telephone Encounter (Signed)
Left voicemail message to call back  

## 2020-01-08 ENCOUNTER — Ambulatory Visit (INDEPENDENT_AMBULATORY_CARE_PROVIDER_SITE_OTHER): Payer: Medicare HMO | Admitting: Nurse Practitioner

## 2020-01-08 ENCOUNTER — Telehealth: Payer: Self-pay

## 2020-01-08 ENCOUNTER — Encounter: Payer: Self-pay | Admitting: Nurse Practitioner

## 2020-01-08 ENCOUNTER — Other Ambulatory Visit: Payer: Self-pay

## 2020-01-08 VITALS — BP 140/70 | HR 53 | Ht 66.0 in | Wt 212.4 lb

## 2020-01-08 DIAGNOSIS — R55 Syncope and collapse: Secondary | ICD-10-CM | POA: Diagnosis not present

## 2020-01-08 DIAGNOSIS — I10 Essential (primary) hypertension: Secondary | ICD-10-CM

## 2020-01-08 DIAGNOSIS — I5032 Chronic diastolic (congestive) heart failure: Secondary | ICD-10-CM | POA: Diagnosis not present

## 2020-01-08 DIAGNOSIS — I4819 Other persistent atrial fibrillation: Secondary | ICD-10-CM

## 2020-01-08 MED ORDER — BISOPROLOL FUMARATE 5 MG PO TABS: ORAL_TABLET | ORAL | Status: DC

## 2020-01-08 NOTE — Telephone Encounter (Signed)
Left message for patient to call office back. KW   Copied from [place] [id number]. Topic: General - Other >> Jan 08, 2020  3:35 PM Lauren Lloyd D wrote: PT is traveling out of the county and would like to speak with a nurse about her medications / please advise

## 2020-01-08 NOTE — Patient Instructions (Addendum)
Medication Instructions:  - Your physician has recommended you make the following change in your medication:   1) DECREASE bisoprolol 5 mg- take 0.5 tablet (2.5 mg) by mouth once daily   *If you need a refill on your cardiac medications before your next appointment, please call your pharmacy*   Lab Work: - none ordered  If you have labs (blood work) drawn today and your tests are completely normal, you will receive your results only by: [name] Kitchen MyChart Message (if you have MyChart) OR . A paper copy in the mail If you have any lab test that is abnormal or we need to change your treatment, we will call you to review the results.   Testing/Procedures: - Your physician has recommended that you wear a 14 day Zio AT (heart) monitor- real time monitor for pre-syncope. Please call the office when you are back in the country and we will plan on mailing this monitor to you to place at home.  This monitor is a medical device that records the heart's electrical activity. Doctors most often use these monitors to diagnose arrhythmias. Arrhythmias are problems with the speed or rhythm of the heartbeat. The monitor is a small device applied to your chest. You can wear one while you do your normal daily activities. While wearing this monitor if you have any symptoms to push the button and record what you felt. Once you have worn this monitor for the period of time provider prescribed (Usually 14 days), you will return the monitor device in the postage paid box. Once it is returned they will download the data collected and provide [name] with a report which the provider will then review and we will call you with those results. Important tips:  1. Avoid showering during the first 24 hours of wearing the monitor. 2. Avoid excessive sweating to help maximize wear time. 3. Do not submerge the device, no hot tubs, and no swimming pools. 4. Keep any lotions or oils away from the patch. 5. After 24 hours you may shower with  the patch on. Take brief showers with your back facing the shower head.  6. Do not remove patch once it has been placed because that will interrupt data and decrease adhesive wear time. 7. Push the button when you have any symptoms and write down what you were feeling. 8. Once you have completed wearing your monitor, remove and place into box which has postage paid and place in your outgoing mailbox.  9. If for some reason you have misplaced your box then call our office and we can provide another box and/or mail it off for you.    Follow-Up: At [facility], you and your health needs are our priority.  As part of our continuing mission to provide you with exceptional heart care, we have created designated Provider Care Teams.  These Care Teams include your primary Cardiologist (physician) and Advanced Practice Providers (APPs -  Physician Assistants and Nurse Practitioners) who all work together to provide you with the care you need, when you need it.  We recommend signing up for the patient portal called "MyChart".  Sign up information is provided on this After Visit Summary.  MyChart is used to connect with patients for Virtual Visits (Telemedicine).  Patients are able to view lab/test results, encounter notes, upcoming appointments, etc.  Non-urgent messages can be sent to your provider as well.   To learn more about what you can do with MyChart, go to [email or web address].  Your next appointment:   2 month(s)  The format for your next appointment:   In Person  Provider:   Ida Rogue, MD   Other Instructions - n/a

## 2020-01-08 NOTE — Progress Notes (Signed)
Office Visit    Patient Name: Lauren Lloyd Date of Encounter: 01/08/2020  Primary Care Provider:  [name], MD Primary Cardiologist:  Ida Rogue, MD  Chief Complaint    79 year old female with a history of paroxysmal atrial fibrillation, HFpEF, hypertension, asthma, chronic lower extremity swelling, aortic atherosclerosis on CT, asthma, multinodular goiter, and arthritis, who presents for follow-up related to A. fib and HFpEF.  Past Medical History    Past Medical History:  Diagnosis Date  . (HFpEF) heart failure with preserved ejection fraction ([place])    a. 05/2018 Echo: EF 55-60%, no rwma, mild to mod MR. Nl RV fxn. Mod TR. PASP 30mmHg.  . Arthritis    knees, Hands  . Arthritis of knee   . Back pain   . Carotid arterial disease ([place])    a. 03/2019 Carotid U/S: <50% bilat ICA stenoses.  . Cholelithiasis    a. 10/2018 noted on CT.  . Edema, lower extremity   . Fatty liver   . GERD (gastroesophageal reflux disease)   . History of stress test    a. 06/2018 MV: EF 59%, no ischemia/infarct. Low risk.  . Knee pain   . Lactose intolerance   . Mitral regurgitation    a. 05/2018 Echo: mild to mod MR.  . Multinodular goiter   . OSA (obstructive sleep apnea)   . PAF (paroxysmal atrial fibrillation) ([place])    a.  Diagnosed 12/19; b. 05/2018 s/p DCCV; c. 03/2019 & 05/2019 recurrent AFib-->managed w/ amio load; d. CHADS2VASc = 6 (CHF, HTN, age x 2, vascular disease, female)-->Eliquis & amio 100 qd.  [name] Kitchen PAH (pulmonary artery hypertension) ([place])   . Scoliosis   . SOB (shortness of breath)   . Swallowing difficulty    Past Surgical History:  Procedure Laterality Date  . CARDIOVERSION N/A 06/15/2018   Procedure: CARDIOVERSION (CATH LAB);  Surgeon: Minna Merritts, MD;  Location: [facility];  Service: Cardiovascular;  Laterality: N/A;  . CATARACT EXTRACTION W/PHACO Right 01/25/2016   Procedure: CATARACT EXTRACTION PHACO AND INTRAOCULAR LENS PLACEMENT ([place]);  Surgeon: Ronnell Freshwater, MD;  Location: [facility];  Service: Ophthalmology;  Laterality: Right;  RIGHT  . CATARACT EXTRACTION W/PHACO Left 02/22/2016   Procedure: CATARACT EXTRACTION PHACO AND INTRAOCULAR LENS PLACEMENT ([facility]);  Surgeon: Ronnell Freshwater, MD;  Location: [facility];  Service: Ophthalmology;  Laterality: Left;  LEFT  . [place]  2010  . KNEE ARTHROSCOPY Right 2004  . REPLACEMENT TOTAL KNEE Right 2009   [facility]  . SKIN GRAFT Left 04/08/2013   Done on left index finger  . TONSILLECTOMY  1946    Allergies  Allergies  Allergen Reactions  . Levofloxacin     Other reaction(s): Joint Pains Other reaction(s): Other (See Comments) Joint pain  . Influenza Vaccines Other (See Comments)    Bell's Palsy  . Oysters [Shellfish Allergy] Swelling    She states she had eaten them three days in a row and she developed swelling around her eyes.     History of Present Illness    80 year old female with a history of paroxysmal atrial fibrillation, HFpEF, hypertension, asthma, chronic lower extremity swelling, aortic atherosclerosis on CT, pulmonary hypertension, asthma, multinodular goiter, and arthritis.  She was previously diagnosed with atrial fibrillation in December 2019.  She was placed on oral anticoagulation subsequently underwent cardioversion in January 2020.  She had recurrent atrial fibrillation November 2020 and was placed on amiodarone therapy.  This converted her to sinus rhythm but then she stopped amiodarone by early December due to concerns about dyspnea.  Amiodarone was resumed.  When she returned to clinic in January 2021, she was back in atrial fibrillation.  She was given an oral amiodarone load and at follow-up on April 12, she was back in sinus rhythm and otw doing well.  Since her last visit, she reports ongoing chronic fatigue which she states has been present for about 2 years. She just notes low energy/no get up  and go. She does tire easily with what sounds like stable dyspnea on exertion. She has chronic low heart rates and sometimes notes blood pressures around 110. She wonders if maybe this is why she is feeling poorly. She has not been experiencing any palpitations recently but about twice a month or so, has brief episodes of presyncope. She has not experienced syncope. She denies chest pain, PND, orthopnea, or early satiety. She sometimes notes mild lower extremity swelling. She is getting ready to go to [place] on September 7 and is looking forward to her trip.  Home Medications    Prior to Admission medications   Medication Sig Start Date End Date Taking? Authorizing Provider  Fluticasone-Umeclidin-Vilant (TRELEGY ELLIPTA) 100-62.5-25 MCG/INH AEPB Inhale into the lungs. 09/16/19  Yes [provider]  amiodarone (PACERONE) 100 MG tablet Take 1 tablet (100 mg total) by mouth daily. 08/26/19   Minna Merritts, MD  bisoprolol (ZEBETA) 5 MG tablet Take 0.5 tablets (2.5 mg total) by mouth 2 (two) times daily. 11/08/19   Bacigalupo, Dionne Bucy, MD  ELIQUIS 5 MG TABS tablet TAKE ONE TABLET BY MOUTH TWICE DAILY 12/13/19   Minna Merritts, MD  furosemide (LASIX) 40 MG tablet Take 1 tablet (40 mg total) by mouth 2 (two) times daily. Take twice a day anytime weight is 218 or higher. Patient taking differently: Take 40 mg by mouth daily. Take twice a day anytime weight is 218 or higher. 05/27/19 08/25/28  Minna Merritts, MD  gabapentin (NEURONTIN) 300 MG capsule Take 300 mg by mouth daily.    [provider]  loperamide (IMODIUM) 2 MG capsule Take 2 mg by mouth as needed.     [provider]  metaxalone (SKELAXIN) 800 MG tablet Take 800 mg by mouth 3 (three) times daily. Patient not taking: Reported on 12/30/2019    [provider]  Multiple Vitamin (MULTIVITAMIN) capsule Take 1 capsule by mouth daily.    [provider]  potassium chloride (KLOR-CON) 10 MEQ tablet Take 1  tablet (10 mEq total) by mouth daily. 04/16/19   Minna Merritts, MD  TRELEGY ELLIPTA 100-62.5-25 MCG/INH AEPB Inhale 1 puff into the lungs daily. 09/16/19   [provider]  Vitamin D, Ergocalciferol, (DRISDOL) 1.25 MG (50000 UNIT) CAPS capsule Take 1 capsule (50,000 Units total) by mouth every 7 (seven) days. 01/06/20   [name], DO    Review of Systems    Periodic brief presyncopal spells. She has chronic fatigue and malaise along with dyspnea on exertion. She denies chest pain, palpitations, PND, orthopnea, syncope, or early satiety. She sometimes notes mild lower extremity swelling.  All other systems reviewed and are otherwise negative except as noted above.  Physical Exam    VS:  BP 140/70 (BP Location: Left Arm, Patient Position: Sitting, Cuff Size: Normal)   Pulse (!) 53   Ht 5\' 6"  (1.676 m)   Wt 212 lb 6.4 oz (96.3 kg)   SpO2 97%  BMI 34.28 kg/m  , BMI Body mass index is 34.28 kg/m.  GEN: Well nourished, well developed, in no acute distress. HEENT: normal. Neck: Supple, no JVD, carotid bruits, or masses. Cardiac: RRR, no murmurs, rubs, or gallops. No clubbing, cyanosis, edema.  Radials/PT 2+ and equal bilaterally.  Respiratory:  Respirations regular and unlabored, clear to auscultation bilaterally. GI: Soft, nontender, nondistended, BS + x 4. MS: no deformity or atrophy. Skin: warm and dry, no rash. Neuro:  Strength and sensation are intact. Psych: Normal affect.  Accessory Clinical Findings    ECG personally reviewed by me today -sinus bradycardia, 53, LVH - no acute changes.  Lab Results  Component Value Date   WBC 8.8 04/01/2019   HGB 13.0 04/01/2019   HCT 38.4 04/01/2019   MCV 90.8 04/01/2019   PLT 274 04/01/2019   Lab Results  Component Value Date   CREATININE 0.85 12/09/2019   BUN 20 12/09/2019   NA 143 12/09/2019   K 4.7 12/09/2019   CL 103 12/09/2019   CO2 23 12/09/2019   Lab Results  Component Value Date   ALT 11 12/09/2019    AST 18 12/09/2019   ALKPHOS 106 12/09/2019   BILITOT 0.4 12/09/2019   Lab Results  Component Value Date   CHOL 260 (H) 12/09/2019   HDL 73 12/09/2019   LDLCALC 168 (H) 12/09/2019   TRIG 112 12/09/2019   CHOLHDL 3.6 05/31/2018    Lab Results  Component Value Date   HGBA1C 5.5 12/09/2019    Assessment & Plan    1. Presyncope: Patient notes that recently, she has experienced episodic presyncope typically occurring while sitting, lasting just a few seconds, and resolving spontaneously. She has not experienced syncope. She has persistent bradycardia and has intermittently noted systolics around 970 but never any lower. She does not check her blood pressure or heart rate during or after presyncopal spells. I offered to place a ZIO monitor to assess for tacky or bradycardia arrhythmias however, she leaves for [place] in about 10 days and would like to hold off until she returns. With baseline bradycardia and presyncope, I did agree that she may reduce her bisoprolol from 2.5 mg twice daily to 2.5 mg daily.  2. Paroxysmal atrial fibrillation: In sinus rhythm today. She remains on low-dose amiodarone which may be contributing to some of her fatigue and malaise however, she did experience recurrent atrial fibrillation off of it. She would like to reduce her bisoprolol and I agreed that she may drop to 2.5 mg daily. She remains anticoagulated with Eliquis 5 mg twice daily.  3. Essential hypertension: Blood pressure elevated today at 140/70 though she says it trends lower at home. She will continue to follow on the lower dose of bisoprolol. She also takes Lasix.  4. Chronic heart failure with preserved EF: Euvolemic today. Continue current dose of Lasix.  5. Future travel: She asked me for a prescription for a broad-spectrum antibiotic. She apparently has been given this in the past when traveling abroad in case she gets sick. I advised that I would not be prescribing an antibiotic today as I could not  know now what it might be treating later. She mentioned that a former physician had given her Levaquin and that she may ask her primary care provider for prescription for this or a Z-Pak. I advised that in the setting of amiodarone therapy, she is a poor candidate for both due to QT prolongation.   6. Disposition: Patient will contact us when  she returns from [place] to arrange for [facility] monitoring. We will otherwise plan to have her follow-up with Dr. Rockey Situ in late October.  Murray Hodgkins, NP 01/08/2020, 2:44 PM

## 2020-01-08 NOTE — Telephone Encounter (Signed)
Yeah. There is no one antibiotic that would cover all possible illness.  Good news is that in [place], it is very easy and cheap to be treated if something does come up.  The pharmacists are able to prescribe antibiotics right in the pharmacy.

## 2020-01-08 NOTE — Telephone Encounter (Signed)
Patient advised that "there is no one antibiotic that would cover all possible illness" she said thank you and hung up.  Patient did not allow me to finish the message from Dr. Jacinto Reap.

## 2020-01-08 NOTE — Telephone Encounter (Signed)
Spoke with patient on the phone who states that she is traveling to [place] and patient is requesting you prescribe her a antibiotic to take with her overseas in case she gets sick. I explained to patient there is no general antibiotic that can be prescribed because illness is unforseen in future. Im not sure if there is a travel antibiotic but I let patient know that I would inform you of request.KW

## 2020-01-10 ENCOUNTER — Telehealth: Payer: Self-pay

## 2020-01-10 NOTE — Telephone Encounter (Signed)
Copied from [place] [id number]. Topic: General - Other >> Jan 10, 2020  1:24 PM Keene Breath wrote: Reason for CRM: Patient called to speak with the doctor regarding some medicine that she needs to be prescribed.  Please call at [phone]

## 2020-01-10 NOTE — Telephone Encounter (Signed)
Patient is wanting amoxicillin to take with her to her [place] trip. I advised her that per Dr. Jacinto Reap, this medication would be easy and inexpensive to get in [place]. If she should happen to get sick, she could get treated. Patient reports that she is going to a very remote area where no one speaks [place]. She wants to have this on hand just in case she gets ill. I also advised that there are different abx that treat different illnesses, and she replies, " I know what works for me". She says that she has a heart condition and she can not be sick overseas.   Patient wanted to talk with Dr. B personally as she felt that the [facility] were not getting her word across for Dr. B to understand why this is needed. Please review. Thanks!

## 2020-01-13 NOTE — Telephone Encounter (Signed)
We can offer her a phone visit/virtual visit to discuss, but I stand by my statement about need for prophylactic antibiotics in [place].

## 2020-01-13 NOTE — Telephone Encounter (Signed)
Pt advised.  Phone appointment scheduled for 01/16/2020 at 3:40  Thanks,   -Mickel Baas

## 2020-01-16 ENCOUNTER — Ambulatory Visit (INDEPENDENT_AMBULATORY_CARE_PROVIDER_SITE_OTHER): Payer: Medicare HMO | Admitting: Family Medicine

## 2020-01-16 ENCOUNTER — Ambulatory Visit: Payer: Medicare HMO | Admitting: Family Medicine

## 2020-01-17 DIAGNOSIS — M5136 Other intervertebral disc degeneration, lumbar region: Secondary | ICD-10-CM | POA: Diagnosis not present

## 2020-01-17 DIAGNOSIS — M5416 Radiculopathy, lumbar region: Secondary | ICD-10-CM | POA: Diagnosis not present

## 2020-01-17 DIAGNOSIS — M48062 Spinal stenosis, lumbar region with neurogenic claudication: Secondary | ICD-10-CM | POA: Diagnosis not present

## 2020-01-18 DIAGNOSIS — Z03818 Encounter for observation for suspected exposure to other biological agents ruled out: Secondary | ICD-10-CM | POA: Diagnosis not present

## 2020-01-18 DIAGNOSIS — Z20822 Contact with and (suspected) exposure to covid-19: Secondary | ICD-10-CM | POA: Diagnosis not present

## 2020-01-28 NOTE — Progress Notes (Signed)
Subjective:   Lauren Lloyd is a 79 y.o. female who presents for Medicare Annual (Subsequent) preventive examination.  Review of Systems    N/A  Cardiac Risk Factors include: advanced age (>67men, >59 women);obesity (BMI >30kg/m2)     Objective:    Today's Vitals   02/03/20 0821  BP: (!) 116/58  Pulse: (!) 51  Temp: 98.8 F (37.1 C)  TempSrc: Oral  SpO2: 97%  Weight: 219 lb 6.4 oz (99.5 kg)  Height: 5\' 6"  (1.676 m)  PainSc: 0-No pain   Body mass index is 35.41 kg/m.  Advanced Directives 02/03/2020 06/10/2019 05/04/2019 12/27/2018 11/05/2018 05/31/2018 05/30/2018  Does Patient Have a Medical Advance Directive? Yes Yes Yes Yes Yes - No  Type of Paramedic of [place];Living will Living will;Healthcare Power of Attorney Living will [place];Living will [place];Living will - -  Does patient want to make changes to medical advance directive? - No - Patient declined No - Patient declined - No - Patient declined - -  Copy of [place] in Chart? No - copy requested - - No - copy requested No - copy requested - -  Would patient like information on creating a medical advance directive? - - - - - No - Patient declined -    Current Medications (verified) Outpatient Encounter Medications as of 02/03/2020  Medication Sig  . amiodarone (PACERONE) 100 MG tablet Take 1 tablet (100 mg total) by mouth daily.  . bisoprolol (ZEBETA) 5 MG tablet Take 0.5 tablet (2.5 mg) by mouth once daily  . ELIQUIS 5 MG TABS tablet TAKE ONE TABLET BY MOUTH TWICE DAILY  . furosemide (LASIX) 40 MG tablet Take 1 tablet (40 mg total) by mouth 2 (two) times daily. Take twice a day anytime weight is 218 or higher. (Patient taking differently: Take 40 mg by mouth daily. Take twice a day anytime weight is 218 or higher.)  . gabapentin (NEURONTIN) 300 MG capsule Take 300 mg by mouth daily.  [name] Kitchen loperamide (IMODIUM) 2 MG capsule Take 2 mg by  mouth as needed.   . metaxalone (SKELAXIN) 800 MG tablet Take 800 mg by mouth 3 (three) times daily.   . Multiple Vitamin (MULTIVITAMIN) capsule Take 1 capsule by mouth daily.  . potassium chloride (KLOR-CON) 10 MEQ tablet Take 1 tablet (10 mEq total) by mouth daily.  . Vitamin D, Ergocalciferol, (DRISDOL) 1.25 MG (50000 UNIT) CAPS capsule Take 1 capsule (50,000 Units total) by mouth every 7 (seven) days. (Patient not taking: Reported on 02/03/2020)  . [DISCONTINUED] bisoprolol (ZEBETA) 5 MG tablet Take 0.5 tablets (2.5 mg total) by mouth 2 (two) times daily.   No facility-administered encounter medications on file as of 02/03/2020.    Allergies (verified) Levofloxacin, Influenza vaccines, and Oysters [shellfish allergy]   History: Past Medical History:  Diagnosis Date  . (HFpEF) heart failure with preserved ejection fraction ([place])    a. 05/2018 Echo: EF 55-60%, no rwma, mild to mod MR. Nl RV fxn. Mod TR. PASP 73mmHg.  . Arthritis    knees, Hands  . Arthritis of knee   . Back pain   . Carotid arterial disease ([place])    a. 03/2019 Carotid U/S: <50% bilat ICA stenoses.  . Cholelithiasis    a. 10/2018 noted on CT.  . Edema, lower extremity   . Fatty liver   . GERD (gastroesophageal reflux disease)   . History of stress test    a. 06/2018  MV: EF 59%, no ischemia/infarct. Low risk.  . Knee pain   . Lactose intolerance   . Mitral regurgitation    a. 05/2018 Echo: mild to mod MR.  . Multinodular goiter   . Obesity   . OSA (obstructive sleep apnea)   . PAF (paroxysmal atrial fibrillation) ([place])    a.  Diagnosed 12/19; b. 05/2018 s/p DCCV; c. 03/2019 & 05/2019 recurrent AFib-->managed w/ amio load; d. CHADS2VASc = 6 (CHF, HTN, age x 2, vascular disease, female)-->Eliquis & amio 100 qd.  [name] Kitchen PAH (pulmonary artery hypertension) ([place])   . Scoliosis   . SOB (shortness of breath)   . Swallowing difficulty    Past Surgical History:  Procedure Laterality Date  . CARDIOVERSION N/A 06/15/2018    Procedure: CARDIOVERSION (CATH LAB);  Surgeon: Minna Merritts, MD;  Location: [facility];  Service: Cardiovascular;  Laterality: N/A;  . CATARACT EXTRACTION W/PHACO Right 01/25/2016   Procedure: CATARACT EXTRACTION PHACO AND INTRAOCULAR LENS PLACEMENT ([place]);  Surgeon: Ronnell Freshwater, MD;  Location: [place];  Service: Ophthalmology;  Laterality: Right;  RIGHT  . CATARACT EXTRACTION W/PHACO Left 02/22/2016   Procedure: CATARACT EXTRACTION PHACO AND INTRAOCULAR LENS PLACEMENT (IOC);  Surgeon: Ronnell Freshwater, MD;  Location: [place];  Service: Ophthalmology;  Laterality: Left;  LEFT  . [place]  2010  . KNEE ARTHROSCOPY Right 2004  . REPLACEMENT TOTAL KNEE Right 2009   [facility]  . SKIN GRAFT Left 04/08/2013   Done on left index finger  . TONSILLECTOMY  1946   Family History  Problem Relation Age of Onset  . Hyperlipidemia Sister   . Atrial fibrillation Sister   . Transient ischemic attack Mother   . Heart disease Mother   . Heart attack Father   . High blood pressure Father   . Stroke Father   . Healthy Brother   . Breast cancer Sister 55  . Hyperlipidemia Sister   . Atrial fibrillation Sister    Social History   Socioeconomic History  . Marital status: Divorced    Spouse name: Not on file  . Number of children: 5  . Years of education: college  . Highest education level: Bachelor's degree (e.g., BA, AB, BS)  Occupational History  . Occupation: Retail banker: [employer]  Tobacco Use  . Smoking status: Former Smoker    Packs/day: 1.00    Years: 30.00    Pack years: 30.00    Types: Cigarettes    Quit date: 05/16/1989    Years since quitting: 30.7  . Smokeless tobacco: Never Used  Vaping Use  . Vaping Use: Never used  Substance and Sexual Activity  . Alcohol use: Yes    Alcohol/week: 7.0 standard drinks    Types: 7 Glasses of wine per week    Comment: 0-2 a night  . Drug  use: No  . Sexual activity: Not Currently  Other Topics Concern  . Not on file  Social History Narrative   Pt has a child who passed away at age 1   Social Determinants of Health   Financial Resource Strain: Low Risk   . Difficulty of Paying Living Expenses: Not hard at all  Food Insecurity: No Food Insecurity  . Worried About Charity fundraiser in the Last Year: Never true  . Ran Out of Food in the Last Year: Never true  Transportation Needs: No Transportation Needs  . Lack of Transportation (  Medical): No  . Lack of Transportation (Non-Medical): No  Physical Activity: Insufficiently Active  . Days of Exercise per Week: 2 days  . Minutes of Exercise per Session: 40 min  Stress: No Stress Concern Present  . Feeling of Stress : Not at all  Social Connections: Moderately Integrated  . Frequency of Communication with Friends and Family: More than three times a week  . Frequency of Social Gatherings with Friends and Family: More than three times a week  . Attends Religious Services: More than 4 times per year  . Active Member of Clubs or Organizations: Yes  . Attends Archivist Meetings: More than 4 times per year  . Marital Status: Divorced    Tobacco Counseling Counseling given: Not Answered   Clinical Intake:  Pre-visit preparation completed: Yes  Pain : No/denies pain Pain Score: 0-No pain     Nutritional Status: BMI > 30  Obese Nutritional Risks: None Diabetes: No  How often do you need to have someone help you when you read instructions, pamphlets, or other written materials from your doctor or pharmacy?: 1 - Never  Diabetic? No  Interpreter Needed?: No  Information entered by :: [facility], LPN   Activities of Daily Living In your present state of health, do you have any difficulty performing the following activities: 02/03/2020  Hearing? N  Vision? N  Comment Wears eye glasses.  Difficulty concentrating or making decisions? N  Walking or  climbing stairs? Y  Comment Due to knee pain and SOB.  Dressing or bathing? N  Doing errands, shopping? N  Preparing Food and eating ? N  Using the Toilet? N  In the past six months, have you accidently leaked urine? Y  Comment Due to waiting too long to empty bladder and on Lasix.  Do you have problems with loss of bowel control? Y  Managing your Medications? N  Managing your Finances? N  Housekeeping or managing your Housekeeping? N  Some recent data might be hidden    Patient Care Team: Bacigalupo, Dionne Bucy, MD as PCP - General (Family Medicine) Rockey Situ Kathlene November, MD as PCP - Cardiology (Cardiology) Verdia Kuba, [facility] (Pharmacist) Sharlet Salina, MD as Referring Physician (Physical Medicine and Rehabilitation) [place], [facility] Dannielle Huh, Luvenia Heller, MD as Consulting Physician (Pulmonary Disease) Dasher, Rayvon Char, MD (Dermatology) [name], DO as Referring Physician (Family Medicine)  Indicate any recent Medical Services you may have received from other than [facility] providers in the past year (date may be approximate).     Assessment:   This is a routine wellness examination for Lauren Lloyd.  Hearing/Vision screen No exam data present  Dietary issues and exercise activities discussed: Current Exercise Habits: Structured exercise class, Type of exercise: strength training/weights, Time (Minutes): 40, Frequency (Times/Week): 2, Weekly Exercise (Minutes/Week): 80, Intensity: Mild, Exercise limited by: orthopedic condition(s);cardiac condition(s)  Goals    . Chronic Care Management     CARE PLAN ENTRY (see longitudinal plan of care for additional care plan information)  Current Barriers:  . Chronic Disease Management support, education, and care coordination needs related to Hypertension, Hyperlipidemia, and Atrial Fibrillation   Hypertension BP Readings from Last 3 Encounters:  12/23/19 (!) 138/48  12/09/19 (!) 140/80  11/08/19 110/62   . Pharmacist Clinical  Goal(s): o Over the next 90 days, patient will work with PharmD and providers to maintain BP goal <140/90 . Current regimen:  o Bisprolol 2.5mg  twice daily . Interventions: o None . Patient self care activities -  Over the next 90 days, patient will: o Check BP weekly, document, and provide at future appointments o Ensure daily salt intake < 2300 mg/day  Hyperlipidemia Lab Results  Component Value Date/Time   LDLCALC 168 (H) 12/09/2019 02:11 PM   . Pharmacist Clinical Goal(s): o Over the next 90 days, patient will work with PharmD and providers to achieve LDL goal < 100 . Current regimen:  o None . Interventions: o None . Patient self care activities - Over the next 90 days, patient will: o Increase lifestyle modifications to lower cholesterol  Chronic Pain . Pharmacist Clinical Goal(s) o Over the next 90 days, patient will work with PharmD and providers to reduce pain . Current regimen:  o Gabapentin 300mg  daily . Interventions: o None . Patient self care activities - Over the next 90 days, patient will: o Report pain level to PharmD and provider o Provide feedback on any medication changes to reduce pain  Medication management . Pharmacist Clinical Goal(s): o Over the next 90 days, patient will work with PharmD and providers to maintain optimal medication adherence . Current pharmacy: [facility] . Interventions o Comprehensive medication review performed. o Continue current medication management strategy . Patient self care activities - Over the next 90 days, patient will: o Focus on medication adherence by continuing current practices o Take medications as prescribed o Report any questions or concerns to PharmD and/or provider(s)  Initial goal documentation     . Reduce portion size     Recommend decreasing portion sizes for each meal (3) and add in 2 healthy snacks in between.       Depression Screen PHQ 2/9 Scores 02/03/2020 12/09/2019 12/27/2018 12/22/2017  12/22/2017 12/01/2016 12/01/2016  PHQ - 2 Score 0 4 0 0 0 0 0  PHQ- 9 Score 6 9 - 3 - 3 -    Fall Risk Fall Risk  02/03/2020 12/27/2018 03/19/2018 12/22/2017 12/01/2016  Falls in the past year? 0 0 0 Yes Yes  Number falls in past yr: 0 - - 2 or more 2 or more  Comment - - - "trips" tripped x 2  Injury with Fall? 0 - - No No  Follow up - - - Falls prevention discussed Falls prevention discussed    Any stairs in or around the home? Yes  If so, are there any without handrails? No  Home free of loose throw rugs in walkways, pet beds, electrical cords, etc? Yes  Adequate lighting in your home to reduce risk of falls? Yes   ASSISTIVE DEVICES UTILIZED TO PREVENT FALLS:  Life alert? No  Use of a cane, walker or w/c? Yes  Grab bars in the bathroom? No  Shower chair or bench in shower? Yes  Elevated toilet seat or a handicapped toilet? No   TIMED UP AND GO:  Was the test performed? Yes .  Length of time to ambulate 10 feet: 9 sec.   Gait slow and steady without use of assistive device  Cognitive Function:     6CIT Screen 02/03/2020 01/03/2019 12/01/2016  What Year? 0 points 0 points 0 points  What month? 0 points 0 points 0 points  What time? 0 points 0 points 0 points  Count back from 20 0 points 0 points 0 points  Months in reverse 0 points 0 points 0 points  Repeat phrase 0 points 2 points 0 points  Total Score 0 2 0    Immunizations Immunization History  Administered Date(s) Administered  . Hepatitis A 11/25/1999,  09/19/2001  . Hepatitis A, Adult 11/25/1999, 09/19/2001  . IPV 02/11/2000  . Pneumococcal Conjugate-13 10/06/2014  . Pneumococcal Polysaccharide-23 12/01/2010  . Td 02/02/1998  . Tdap 09/11/2007, 02/27/2012  . Typhoid Inactivated 02/11/2000    TDAP status: Up to date Flu Vaccine status: Declined, Education has been provided regarding the importance of this vaccine but patient still declined. Advised may receive this vaccine at local pharmacy or Health Dept. Aware to  provide a copy of the vaccination record if obtained from local pharmacy or Health Dept. Verbalized acceptance and understanding. Pneumococcal vaccine status: Up to date Covid-19 vaccine status: Completed vaccines  Qualifies for Shingles Vaccine? Yes   Zostavax completed No   Shingrix Completed?: No.    Education has been provided regarding the importance of this vaccine. Patient has been advised to call insurance company to determine out of pocket expense if they have not yet received this vaccine. Advised may also receive vaccine at local pharmacy or Health Dept. Verbalized acceptance and understanding.  Screening Tests Health Maintenance  Topic Date Due  . COVID-19 Vaccine (1) Never done  . INFLUENZA VACCINE  08/13/2020 (Originally 12/15/2019)  . TETANUS/TDAP  02/26/2022  . DEXA SCAN  Completed  . PNA vac Low Risk Adult  Completed    Health Maintenance  Health Maintenance Due  Topic Date Due  . COVID-19 Vaccine (1) Never done    Colorectal cancer screening: No longer required.  Mammogram status: No longer required.  Bone Density status: Completed 08/06/13. Results reflect: Previous DEXA scan was normal. No repeat needed unless advised by a physician.  Lung Cancer Screening: (Low Dose CT Chest recommended if Age 1-80 years, 30 pack-year currently smoking OR have quit w/in 15years.) does not qualify.    Additional Screening:  Vision Screening: Recommended annual ophthalmology exams for early detection of glaucoma and other disorders of the eye. Is the patient up to date with their annual eye exam?  Yes  Who is the provider or what is the name of the office in which the patient attends annual eye exams? [facility] If pt is not established with a provider, would they like to be referred to a provider to establish care? No .   Dental Screening: Recommended annual dental exams for proper oral hygiene  Community Resource Referral / Chronic Care Management: CRR required  this visit?  No   CCM required this visit?  No      Plan:     I have personally reviewed and noted the following in the patient's chart:   . Medical and social history . Use of alcohol, tobacco or illicit drugs  . Current medications and supplements . Functional ability and status . Nutritional status . Physical activity . Advanced directives . List of other physicians . Hospitalizations, surgeries, and ER visits in previous 12 months . Vitals . Screenings to include cognitive, depression, and falls . Referrals and appointments  In addition, I have reviewed and discussed with patient certain preventive protocols, quality metrics, and best practice recommendations. A written personalized care plan for preventive services as well as general preventive health recommendations were provided to patient.     Talisa Petrak [place], [place]   9/45/[id number]   Nurse Notes: Declined a flu shot today. Requested Covid vaccine card information to up date chart.

## 2020-02-03 ENCOUNTER — Ambulatory Visit (INDEPENDENT_AMBULATORY_CARE_PROVIDER_SITE_OTHER): Payer: Medicare HMO

## 2020-02-03 ENCOUNTER — Other Ambulatory Visit: Payer: Self-pay

## 2020-02-03 ENCOUNTER — Encounter: Payer: Self-pay | Admitting: Family Medicine

## 2020-02-03 ENCOUNTER — Ambulatory Visit (INDEPENDENT_AMBULATORY_CARE_PROVIDER_SITE_OTHER): Payer: Medicare HMO | Admitting: Family Medicine

## 2020-02-03 VITALS — BP 116/58 | HR 51 | Temp 98.8°F | Ht 66.0 in | Wt 219.0 lb

## 2020-02-03 VITALS — BP 116/58 | HR 51 | Temp 98.8°F | Ht 66.0 in | Wt 219.4 lb

## 2020-02-03 DIAGNOSIS — E559 Vitamin D deficiency, unspecified: Secondary | ICD-10-CM

## 2020-02-03 DIAGNOSIS — I10 Essential (primary) hypertension: Secondary | ICD-10-CM | POA: Diagnosis not present

## 2020-02-03 DIAGNOSIS — E669 Obesity, unspecified: Secondary | ICD-10-CM | POA: Diagnosis not present

## 2020-02-03 DIAGNOSIS — Z0000 Encounter for general adult medical examination without abnormal findings: Secondary | ICD-10-CM

## 2020-02-03 DIAGNOSIS — E7800 Pure hypercholesterolemia, unspecified: Secondary | ICD-10-CM

## 2020-02-03 MED ORDER — VITAMIN D (ERGOCALCIFEROL) 1.25 MG (50000 UNIT) PO CAPS: 50000.0000 [IU] | ORAL_CAPSULE | ORAL | 0 refills | Status: DC

## 2020-02-03 NOTE — Patient Instructions (Signed)
[name] , Thank you for taking time to come for your Medicare Wellness Visit. I appreciate your ongoing commitment to your health goals. Please review the following plan we discussed and let me know if I can assist you in the future.   Screening recommendations/referrals: Colonoscopy: No longer required.  Mammogram: No longer required.  Bone Density: Previous DEXA scan was normal. No repeat needed unless advised by a physician. Recommended yearly ophthalmology/optometry visit for glaucoma screening and checkup Recommended yearly dental visit for hygiene and checkup  Vaccinations: Influenza vaccine: Currently due. Declined today.  Pneumococcal vaccine: Completed series Tdap vaccine: Up to date, due 02/2022 Shingles vaccine: Shingrix discussed. Please contact your pharmacy for coverage information.     Advanced directives: Please bring a copy of your POA (Power of Attorney) and/or Living Will to your next appointment.   Conditions/risks identified: Continue to decrease portion sizes for each meal (3) and add in 2 healthy snacks in between.   Next appointment: 9:00 AM today with Dr Brita Romp   Preventive Care 35 Years and Older, Female Preventive care refers to lifestyle choices and visits with your health care provider that can promote health and wellness. What does preventive care include?  A yearly physical exam. This is also called an annual well check.  Dental exams once or twice a year.  Routine eye exams. Ask your health care provider how often you should have your eyes checked.  Personal lifestyle choices, including:  Daily care of your teeth and gums.  Regular physical activity.  Eating a healthy diet.  Avoiding tobacco and drug use.  Limiting alcohol use.  Practicing safe sex.  Taking low-dose aspirin every day.  Taking vitamin and mineral supplements as recommended by your health care provider. What happens during an annual well check? The services and  screenings done by your health care provider during your annual well check will depend on your age, overall health, lifestyle risk factors, and family history of disease. Counseling  Your health care provider may ask you questions about your:  Alcohol use.  Tobacco use.  Drug use.  Emotional well-being.  Home and relationship well-being.  Sexual activity.  Eating habits.  History of falls.  Memory and ability to understand (cognition).  Work and work Statistician.  Reproductive health. Screening  You may have the following tests or measurements:  Height, weight, and BMI.  Blood pressure.  Lipid and cholesterol levels. These may be checked every 5 years, or more frequently if you are over 71 years old.  Skin check.  Lung cancer screening. You may have this screening every year starting at age 58 if you have a 30-pack-year history of smoking and currently smoke or have quit within the past 15 years.  Fecal occult blood test (FOBT) of the stool. You may have this test every year starting at age 4.  Flexible sigmoidoscopy or colonoscopy. You may have a sigmoidoscopy every 5 years or a colonoscopy every 10 years starting at age 47.  Hepatitis C blood test.  Hepatitis B blood test.  Sexually transmitted disease (STD) testing.  Diabetes screening. This is done by checking your blood sugar (glucose) after you have not eaten for a while (fasting). You may have this done every 1-3 years.  Bone density scan. This is done to screen for osteoporosis. You may have this done starting at age 71.  Mammogram. This may be done every 1-2 years. Talk to your health care provider about how often you should have regular mammograms.  Talk with your health care provider about your test results, treatment options, and if necessary, the need for more tests. Vaccines  Your health care provider may recommend certain vaccines, such as:  Influenza vaccine. This is recommended every  year.  Tetanus, diphtheria, and acellular pertussis (Tdap, Td) vaccine. You may need a Td booster every 10 years.  Zoster vaccine. You may need this after age 42.  Pneumococcal 13-valent conjugate (PCV13) vaccine. One dose is recommended after age 87.  Pneumococcal polysaccharide (PPSV23) vaccine. One dose is recommended after age 55. Talk to your health care provider about which screenings and vaccines you need and how often you need them. This information is not intended to replace advice given to you by your health care provider. Make sure you discuss any questions you have with your health care provider. Document Released: 05/29/2015 Document Revised: 01/20/2016 Document Reviewed: 03/03/2015 Elsevier Interactive Patient Education  2017 [place] Prevention in the Home Falls can cause injuries. They can happen to people of all ages. There are many things you can do to make your home safe and to help prevent falls. What can I do on the outside of my home?  Regularly fix the edges of walkways and driveways and fix any cracks.  Remove anything that might make you trip as you walk through a door, such as a raised step or threshold.  Trim any bushes or trees on the path to your home.  Use bright outdoor lighting.  Clear any walking paths of anything that might make someone trip, such as rocks or tools.  Regularly check to see if handrails are loose or broken. Make sure that both sides of any steps have handrails.  Any raised decks and porches should have guardrails on the edges.  Have any leaves, snow, or ice cleared regularly.  Use sand or salt on walking paths during winter.  Clean up any spills in your garage right away. This includes oil or grease spills. What can I do in the bathroom?  Use night lights.  Install grab bars by the toilet and in the tub and shower. Do not use towel bars as grab bars.  Use non-skid mats or decals in the tub or shower.  If you  need to sit down in the shower, use a plastic, non-slip stool.  Keep the floor dry. Clean up any water that spills on the floor as soon as it happens.  Remove soap buildup in the tub or shower regularly.  Attach bath mats securely with double-sided non-slip rug tape.  Do not have throw rugs and other things on the floor that can make you trip. What can I do in the bedroom?  Use night lights.  Make sure that you have a light by your bed that is easy to reach.  Do not use any sheets or blankets that are too big for your bed. They should not hang down onto the floor.  Have a firm chair that has side arms. You can use this for support while you get dressed.  Do not have throw rugs and other things on the floor that can make you trip. What can I do in the kitchen?  Clean up any spills right away.  Avoid walking on wet floors.  Keep items that you use a lot in easy-to-reach places.  If you need to reach something above you, use a strong step stool that has a grab bar.  Keep electrical cords out of the way.  Do not use floor polish or wax that makes floors slippery. If you must use wax, use non-skid floor wax.  Do not have throw rugs and other things on the floor that can make you trip. What can I do with my stairs?  Do not leave any items on the stairs.  Make sure that there are handrails on both sides of the stairs and use them. Fix handrails that are broken or loose. Make sure that handrails are as long as the stairways.  Check any carpeting to make sure that it is firmly attached to the stairs. Fix any carpet that is loose or worn.  Avoid having throw rugs at the top or bottom of the stairs. If you do have throw rugs, attach them to the floor with carpet tape.  Make sure that you have a light switch at the top of the stairs and the bottom of the stairs. If you do not have them, ask someone to add them for you. What else can I do to help prevent falls?  Wear shoes  that:  Do not have high heels.  Have rubber bottoms.  Are comfortable and fit you well.  Are closed at the toe. Do not wear sandals.  If you use a stepladder:  Make sure that it is fully opened. Do not climb a closed stepladder.  Make sure that both sides of the stepladder are locked into place.  Ask someone to hold it for you, if possible.  Clearly mark and make sure that you can see:  Any grab bars or handrails.  First and last steps.  Where the edge of each step is.  Use tools that help you move around (mobility aids) if they are needed. These include:  Canes.  Walkers.  Scooters.  Crutches.  Turn on the lights when you go into a dark area. Replace any light bulbs as soon as they burn out.  Set up your furniture so you have a clear path. Avoid moving your furniture around.  If any of your floors are uneven, fix them.  If there are any pets around you, be aware of where they are.  Review your medicines with your doctor. Some medicines can make you feel dizzy. This can increase your chance of falling. Ask your doctor what other things that you can do to help prevent falls. This information is not intended to replace advice given to you by your health care provider. Make sure you discuss any questions you have with your health care provider. Document Released: 02/26/2009 Document Revised: 10/08/2015 Document Reviewed: 06/06/2014 Elsevier Interactive Patient Education  2017 [employer].

## 2020-02-03 NOTE — Progress Notes (Signed)
Delena Bali Walsh,acting as a scribe for Lavon Paganini, MD.,have documented all relevant documentation on the behalf of Lavon Paganini, MD,as directed by  Lavon Paganini, MD while in the presence of Lavon Paganini, MD.   Annual Wellness Visit     Patient: Lauren Lloyd, Female    DOB: 1941/01/28, 79 y.o.   MRN: [id number] Visit Date: 02/03/2020  Today's Provider: Lavon Paganini, MD   Chief Complaint  Patient presents with  . Annual Exam   Subjective    Lauren Lloyd is a 79 y.o. female who presents today for her Annual Wellness Visit. She reports consuming a general diet  She generally feels fairly well. She reports sleeping well. She does have additional problems to discuss today.   HPI   Chronic Shortness of breath Fatigue Not using CPAP   Patient Active Problem List   Diagnosis Date Noted  . Obesity (BMI 30-39.9) 11/08/2019  . Bradycardia 11/08/2019  . Dizziness 11/08/2019  . Dyspnea on exertion 08/28/2018  . Degenerative cervical spinal stenosis 07/26/2018  . Degenerative arthritis of left knee 07/26/2018  . Persistent atrial fibrillation ([place]) 06/06/2018  . Paresthesias 05/30/2018  . Chronic diastolic CHF (congestive heart failure) ([place]) 05/11/2018  . Centrilobular emphysema ([place]) 05/11/2018  . Hematuria 04/04/2017  . Leg cramps 04/04/2017  . Metatarsalgia of both feet 04/04/2017  . 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 Hypertension 01/03/2017  . Hemorrhoids 01/03/2017  . Healthcare maintenance 01/03/2017  . Chronic right shoulder pain 01/03/2017  . Pulmonary hypertension, unspecified ([place]) 04/02/2015  . Chest pain radiating to arm 02/17/2015  . Acid reflux 11/18/2014  . IBS (irritable bowel syndrome) 11/18/2014  . Primary osteoarthritis of one hip 10/03/2011  . L-S radiculopathy 09/29/2011  . Arthritis of knee, degenerative 09/29/2011  . S/P knee replacement 09/29/2011  . Non-toxic uninodular goiter 06/20/2009  . Cervical pain  08/20/2008  . OSA (obstructive sleep apnea) 12/12/2007  . Hypercholesteremia 07/30/2007   Past Medical History:  Diagnosis Date  . (HFpEF) heart failure with preserved ejection fraction ([place])    a. 05/2018 Echo: EF 55-60%, no rwma, mild to mod MR. Nl RV fxn. Mod TR. PASP 37mmHg.  . Arthritis    knees, Hands  . Arthritis of knee   . Back pain   . Carotid arterial disease ([place])    a. 03/2019 Carotid U/S: <50% bilat ICA stenoses.  . Cholelithiasis    a. 10/2018 noted on CT.  . Edema, lower extremity   . Fatty liver   . GERD (gastroesophageal reflux disease)   . History of stress test    a. 06/2018 MV: EF 59%, no ischemia/infarct. Low risk.  . Knee pain   . Lactose intolerance   . Mitral regurgitation    a. 05/2018 Echo: mild to mod MR.  . Multinodular goiter   . Obesity   . OSA (obstructive sleep apnea)   . PAF (paroxysmal atrial fibrillation) ([place])    a.  Diagnosed 12/19; b. 05/2018 s/p DCCV; c. 03/2019 & 05/2019 recurrent AFib-->managed w/ amio load; d. CHADS2VASc = 6 (CHF, HTN, age x 2, vascular disease, female)-->Eliquis & amio 100 qd.  [name] Kitchen PAH (pulmonary artery hypertension) ()   . Scoliosis   . SOB (shortness of breath)   . Swallowing difficulty    Social History   Tobacco Use  . Smoking status: Former Smoker    Packs/day: 1.00    Years: 30.00    Pack years: 30.00    Types: Cigarettes  Quit date: 05/16/1989    Years since quitting: 30.7  . Smokeless tobacco: Never Used  Vaping Use  . Vaping Use: Never used  Substance Use Topics  . Alcohol use: Yes    Alcohol/week: 7.0 standard drinks    Types: 7 Glasses of wine per week    Comment: 0-2 a night  . Drug use: No   Allergies  Allergen Reactions  . Levofloxacin     Other reaction(s): Joint Pains Other reaction(s): Other (See Comments) Joint pain  . Influenza Vaccines Other (See Comments)    Bell's Palsy  . Oysters [Shellfish Allergy] Swelling    She states she had eaten them three days in a row and she  developed swelling around her eyes.      Medications: Outpatient Medications Prior to Visit  Medication Sig  . amiodarone (PACERONE) 100 MG tablet Take 1 tablet (100 mg total) by mouth daily.  . bisoprolol (ZEBETA) 5 MG tablet Take 0.5 tablet (2.5 mg) by mouth once daily  . ELIQUIS 5 MG TABS tablet TAKE ONE TABLET BY MOUTH TWICE DAILY  . furosemide (LASIX) 40 MG tablet Take 1 tablet (40 mg total) by mouth 2 (two) times daily. Take twice a day anytime weight is 218 or higher. (Patient taking differently: Take 40 mg by mouth daily. Take twice a day anytime weight is 218 or higher.)  . gabapentin (NEURONTIN) 300 MG capsule Take 300 mg by mouth daily.  [name] Kitchen loperamide (IMODIUM) 2 MG capsule Take 2 mg by mouth as needed.   . metaxalone (SKELAXIN) 800 MG tablet Take 800 mg by mouth 3 (three) times daily.   . Multiple Vitamin (MULTIVITAMIN) capsule Take 1 capsule by mouth daily.  . potassium chloride (KLOR-CON) 10 MEQ tablet Take 1 tablet (10 mEq total) by mouth daily.  . Vitamin D, Ergocalciferol, (DRISDOL) 1.25 MG (50000 UNIT) CAPS capsule Take 1 capsule (50,000 Units total) by mouth every 7 (seven) days.   No facility-administered medications prior to visit.    Allergies  Allergen Reactions  . Levofloxacin     Other reaction(s): Joint Pains Other reaction(s): Other (See Comments) Joint pain  . Influenza Vaccines Other (See Comments)    Bell's Palsy  . Oysters [Shellfish Allergy] Swelling    She states she had eaten them three days in a row and she developed swelling around her eyes.     Patient Care Team: [name], MD as PCP - General (Family Medicine) Rockey Situ Kathlene November, MD as PCP - Cardiology (Cardiology) Verdia Kuba, [facility] (Pharmacist) Sharlet Salina, MD as Referring Physician (Physical Medicine and Rehabilitation) Miner, [facility] Dannielle Huh, Luvenia Heller, MD as Consulting Physician (Pulmonary Disease) Dasher, Rayvon Char, MD (Dermatology) [name], DO as  Referring Physician (Family Medicine)  Review of Systems  Constitutional: Positive for fatigue. Negative for activity change, appetite change, chills, diaphoresis, fever and unexpected weight change.  HENT: Negative.   Eyes: Negative.   Respiratory: Positive for shortness of breath and wheezing. Negative for apnea, cough, choking, chest tightness and stridor.   Cardiovascular: Negative.   Gastrointestinal: Positive for diarrhea. Negative for abdominal distention, abdominal pain, anal bleeding, blood in stool, constipation, nausea, rectal pain and vomiting.  Endocrine: Negative.   Genitourinary: Negative.   Musculoskeletal: Negative.   Skin: Negative.   Allergic/Immunologic: Negative.   Neurological: Negative.   Hematological: Negative.   Psychiatric/Behavioral: Negative.       Objective    Vitals: BP (!) 116/58   Pulse (!) 51  Temp 98.8 F (37.1 C) (Oral)   Ht 5\' 6"  (1.676 m)   Wt 219 lb (99.3 kg)   SpO2 97%   BMI 35.35 kg/m    Physical Exam Vitals reviewed.  Constitutional:      General: She is not in acute distress.    Appearance: Normal appearance. She is well-developed. She is not diaphoretic.  HENT:     Head: Normocephalic and atraumatic.     Right Ear: Tympanic membrane, ear canal and external ear normal.     Left Ear: Tympanic membrane, ear canal and external ear normal.  Eyes:     General: No scleral icterus.    Conjunctiva/sclera: Conjunctivae normal.     Pupils: Pupils are equal, round, and reactive to light.  Neck:     Thyroid: No thyromegaly.  Cardiovascular:     Rate and Rhythm: Normal rate and regular rhythm.     Pulses: Normal pulses.     Heart sounds: Normal heart sounds. No murmur heard.   Pulmonary:     Effort: Pulmonary effort is normal. No respiratory distress.     Breath sounds: Normal breath sounds. No wheezing or rales.  Abdominal:     General: There is no distension.     Palpations: Abdomen is soft.     Tenderness: There is no  abdominal tenderness.  Musculoskeletal:        General: No deformity.     Cervical back: Neck supple.     Right lower leg: No edema.     Left lower leg: No edema.  Lymphadenopathy:     Cervical: No cervical adenopathy.  Skin:    General: Skin is warm and dry.     Findings: No rash.  Neurological:     Mental Status: She is alert and oriented to person, place, and time. Mental status is at baseline.     Sensory: No sensory deficit.     Motor: No weakness.     Gait: Gait normal.  Psychiatric:        Mood and Affect: Mood normal.        Behavior: Behavior normal.        Thought Content: Thought content normal.     Most recent functional status assessment: In your present state of health, do you have any difficulty performing the following activities: 02/03/2020  Hearing? N  Vision? N  Comment Wears eye glasses.  Difficulty concentrating or making decisions? N  Walking or climbing stairs? Y  Comment Due to knee pain and SOB.  Dressing or bathing? N  Doing errands, shopping? N  Preparing Food and eating ? N  Using the Toilet? N  In the past six months, have you accidently leaked urine? Y  Comment Due to waiting too long to empty bladder and on Lasix.  Do you have problems with loss of bowel control? Y  Managing your Medications? N  Managing your Finances? N  Housekeeping or managing your Housekeeping? N  Some recent data might be hidden   Most recent fall risk assessment: Fall Risk  02/03/2020  Falls in the past year? 0  Number falls in past yr: 0  Comment -  Injury with Fall? 0  Follow up -    Most recent depression screenings: PHQ 2/9 Scores 02/03/2020 12/09/2019  PHQ - 2 Score 0 4  PHQ- 9 Score 6 9   Most recent cognitive screening: 6CIT Screen 02/03/2020  What Year? 0 points  What month? 0 points  What time?  0 points  Count back from 20 0 points  Months in reverse 0 points  Repeat phrase 0 points  Total Score 0   Most recent Audit-C alcohol use  screening Alcohol Use Disorder Test (AUDIT) 02/03/2020  1. How often do you have a drink containing alcohol? 4  2. How many drinks containing alcohol do you have on a typical day when you are drinking? 0  3. How often do you have six or more drinks on one occasion? 0  AUDIT-C Score 4  4. How often during the last year have you found that you were not able to stop drinking once you had started? 0  5. How often during the last year have you failed to do what was normally expected from you because of drinking? 0  6. How often during the last year have you needed a first drink in the morning to get yourself going after a heavy drinking session? 0  7. How often during the last year have you had a feeling of guilt of remorse after drinking? 0  8. How often during the last year have you been unable to remember what happened the night before because you had been drinking? 0  9. Have you or someone else been injured as a result of your drinking? 0  10. Has a relative or friend or a doctor or another health worker been concerned about your drinking or suggested you cut down? 0  Alcohol Use Disorder Identification Test Final Score (AUDIT) 4  Alcohol Brief Interventions/Follow-up AUDIT Score <7 follow-up not indicated   A score of 3 or more in women, and 4 or more in men indicates increased risk for alcohol abuse, EXCEPT if all of the points are from question 1   No results found for any visits on 02/03/20.  Assessment & Plan     Annual wellness visit done today including the all of the following: Reviewed patient's Family Medical History Reviewed and updated list of patient's medical providers Assessment of cognitive impairment was done Assessed patient's functional ability Established a written schedule for health screening [place] Completed and Reviewed  Exercise Activities and Dietary recommendations Goals    . Chronic Care Management     CARE PLAN ENTRY (see  longitudinal plan of care for additional care plan information)  Current Barriers:  . Chronic Disease Management support, education, and care coordination needs related to Hypertension, Hyperlipidemia, and Atrial Fibrillation   Hypertension BP Readings from Last 3 Encounters:  12/23/19 (!) 138/48  12/09/19 (!) 140/80  11/08/19 110/62   . Pharmacist Clinical Goal(s): o Over the next 90 days, patient will work with PharmD and providers to maintain BP goal <140/90 . Current regimen:  o Bisprolol 2.5mg  twice daily . Interventions: o None . Patient self care activities - Over the next 90 days, patient will: o Check BP weekly, document, and provide at future appointments o Ensure daily salt intake < 2300 mg/day  Hyperlipidemia Lab Results  Component Value Date/Time   LDLCALC 168 (H) 12/09/2019 02:11 PM   . Pharmacist Clinical Goal(s): o Over the next 90 days, patient will work with PharmD and providers to achieve LDL goal < 100 . Current regimen:  o None . Interventions: o None . Patient self care activities - Over the next 90 days, patient will: o Increase lifestyle modifications to lower cholesterol  Chronic Pain . Pharmacist Clinical Goal(s) o Over the next 90 days, patient will work with PharmD and providers to  reduce pain . Current regimen:  o Gabapentin 300mg  daily . Interventions: o None . Patient self care activities - Over the next 90 days, patient will: o Report pain level to PharmD and provider o Provide feedback on any medication changes to reduce pain  Medication management . Pharmacist Clinical Goal(s): o Over the next 90 days, patient will work with PharmD and providers to maintain optimal medication adherence . Current pharmacy: [facility] . Interventions o Comprehensive medication review performed. o Continue current medication management strategy . Patient self care activities - Over the next 90 days, patient will: o Focus on medication adherence by  continuing current practices o Take medications as prescribed o Report any questions or concerns to PharmD and/or provider(s)  Initial goal documentation     . Reduce portion size     Recommend decreasing portion sizes for each meal (3) and add in 2 healthy snacks in between.        Immunization History  Administered Date(s) Administered  . Hepatitis A 11/25/1999, 09/19/2001  . Hepatitis A, Adult 11/25/1999, 09/19/2001  . IPV 02/11/2000  . Pneumococcal Conjugate-13 10/06/2014  . Pneumococcal Polysaccharide-23 12/01/2010  . Td 02/02/1998  . Tdap 09/11/2007, 02/27/2012  . Typhoid Inactivated 02/11/2000    Health Maintenance  Topic Date Due  . COVID-19 Vaccine (1) Never done  . INFLUENZA VACCINE  08/13/2020 (Originally 12/15/2019)  . TETANUS/TDAP  02/26/2022  . DEXA SCAN  Completed  . PNA vac Low Risk Adult  Completed     Discussed health benefits of physical activity, and encouraged her to engage in regular exercise appropriate for her age and condition.    Problem List Items Addressed This Visit      Cardiovascular and Mediastinum   Hypertension - Primary    Well controlled Continue current medications Reviewed metabolic panel F/u in 6 months         Other   Hypercholesteremia    Previously well controlled Not on a statin Reviewed FLP and CMP Goal LDL < 100       Obesity (BMI 30-39.9)    Discussed importance of healthy weight management Discussed diet and exercise        Other Visit Diagnoses    Vitamin D deficiency       Relevant Medications   Vitamin D, Ergocalciferol, (DRISDOL) 1.25 MG (50000 UNIT) CAPS capsule      Return in about 6 months (around 08/02/2020).     I, Lavon Paganini, MD, have reviewed all documentation for this visit. The documentation on 02/03/20 for the exam, diagnosis, procedures, and orders are all accurate and complete.   Emalia Witkop, Dionne Bucy, MD, MPH [facility]

## 2020-02-03 NOTE — Assessment & Plan Note (Signed)
Discussed importance of healthy weight management Discussed diet and exercise  

## 2020-02-03 NOTE — Assessment & Plan Note (Signed)
Previously well controlled Not on a statin Reviewed FLP and CMP Goal LDL < 100

## 2020-02-03 NOTE — Assessment & Plan Note (Signed)
Well controlled Continue current medications Reviewed metabolic panel F/u in 6 months  

## 2020-02-03 NOTE — Patient Instructions (Addendum)
Check with your insurance about coverage for Shingrix Vaccine.    Preventive Care 80 Years and Older, Female Preventive care refers to lifestyle choices and visits with your health care provider that can promote health and wellness. This includes:  A yearly physical exam. This is also called an annual well check.  Regular dental and eye exams.  Immunizations.  Screening for certain conditions.  Healthy lifestyle choices, such as diet and exercise. What can I expect for my preventive care visit? Physical exam Your health care provider will check:  Height and weight. These may be used to calculate body mass index (BMI), which is a measurement that tells if you are at a healthy weight.  Heart rate and blood pressure.  Your skin for abnormal spots. Counseling Your health care provider may ask you questions about:  Alcohol, tobacco, and drug use.  Emotional well-being.  Home and relationship well-being.  Sexual activity.  Eating habits.  History of falls.  Memory and ability to understand (cognition).  Work and work Statistician.  Pregnancy and menstrual history. What immunizations do I need?  Influenza (flu) vaccine  This is recommended every year. Tetanus, diphtheria, and pertussis (Tdap) vaccine  You may need a Td booster every 10 years. Varicella (chickenpox) vaccine  You may need this vaccine if you have not already been vaccinated. Zoster (shingles) vaccine  You may need this after age 32. Pneumococcal conjugate (PCV13) vaccine  One dose is recommended after age 12. Pneumococcal polysaccharide (PPSV23) vaccine  One dose is recommended after age 1. Measles, mumps, and rubella (MMR) vaccine  You may need at least one dose of MMR if you were born in 1957 or later. You may also need a second dose. Meningococcal conjugate (MenACWY) vaccine  You may need this if you have certain conditions. Hepatitis A vaccine  You may need this if you have certain  conditions or if you travel or work in places where you may be exposed to hepatitis A. Hepatitis B vaccine  You may need this if you have certain conditions or if you travel or work in places where you may be exposed to hepatitis B. Haemophilus influenzae type b (Hib) vaccine  You may need this if you have certain conditions. You may receive vaccines as individual doses or as more than one vaccine together in one shot (combination vaccines). Talk with your health care provider about the risks and benefits of combination vaccines. What tests do I need? Blood tests  Lipid and cholesterol levels. These may be checked every 5 years, or more frequently depending on your overall health.  Hepatitis C test.  Hepatitis B test. Screening  Lung cancer screening. You may have this screening every year starting at age 70 if you have a 30-pack-year history of smoking and currently smoke or have quit within the past 15 years.  Colorectal cancer screening. All adults should have this screening starting at age 20 and continuing until age 41. Your health care provider may recommend screening at age 68 if you are at increased risk. You will have tests every 1-10 years, depending on your results and the type of screening test.  Diabetes screening. This is done by checking your blood sugar (glucose) after you have not eaten for a while (fasting). You may have this done every 1-3 years.  Mammogram. This may be done every 1-2 years. Talk with your health care provider about how often you should have regular mammograms.  BRCA-related cancer screening. This may be done if  you have a family history of breast, ovarian, tubal, or peritoneal cancers. Other tests  Sexually transmitted disease (STD) testing.  Bone density scan. This is done to screen for osteoporosis. You may have this done starting at age 45. Follow these instructions at home: Eating and drinking  Eat a diet that includes fresh fruits and  vegetables, whole grains, lean protein, and low-fat dairy products. Limit your intake of foods with high amounts of sugar, saturated fats, and salt.  Take vitamin and mineral supplements as recommended by your health care provider.  Do not drink alcohol if your health care provider tells you not to drink.  If you drink alcohol: ? Limit how much you have to 0-1 drink a day. ? Be aware of how much alcohol is in your drink. In the [place] one drink equals one 12 oz bottle of beer (355 mL), one 5 oz glass of wine (148 mL), or one 1 oz glass of hard liquor (44 mL). Lifestyle  Take daily care of your teeth and gums.  Stay active. Exercise for at least 30 minutes on 5 or more days each week.  Do not use any products that contain nicotine or tobacco, such as cigarettes, e-cigarettes, and chewing tobacco. If you need help quitting, ask your health care provider.  If you are sexually active, practice safe sex. Use a condom or other form of protection in order to prevent STIs (sexually transmitted infections).  Talk with your health care provider about taking a low-dose aspirin or statin. What's next?  Go to your health care provider once a year for a well check visit.  Ask your health care provider how often you should have your eyes and teeth checked.  Stay up to date on all vaccines. This information is not intended to replace advice given to you by your health care provider. Make sure you discuss any questions you have with your health care provider. Document Revised: 04/26/2018 Document Reviewed: 04/26/2018 Elsevier Patient Education  2020 [employer].

## 2020-02-05 ENCOUNTER — Ambulatory Visit: Payer: Medicare HMO | Admitting: Dermatology

## 2020-02-05 DIAGNOSIS — G4733 Obstructive sleep apnea (adult) (pediatric): Secondary | ICD-10-CM | POA: Diagnosis not present

## 2020-02-10 ENCOUNTER — Encounter (INDEPENDENT_AMBULATORY_CARE_PROVIDER_SITE_OTHER): Payer: Self-pay | Admitting: Family Medicine

## 2020-02-10 ENCOUNTER — Ambulatory Visit (INDEPENDENT_AMBULATORY_CARE_PROVIDER_SITE_OTHER): Payer: Medicare HMO | Admitting: Family Medicine

## 2020-02-10 ENCOUNTER — Other Ambulatory Visit: Payer: Self-pay

## 2020-02-10 VITALS — BP 120/68 | HR 72 | Temp 98.0°F | Ht 66.0 in | Wt 208.0 lb

## 2020-02-10 DIAGNOSIS — I10 Essential (primary) hypertension: Secondary | ICD-10-CM | POA: Diagnosis not present

## 2020-02-10 DIAGNOSIS — Z6833 Body mass index (BMI) 33.0-33.9, adult: Secondary | ICD-10-CM

## 2020-02-10 DIAGNOSIS — E559 Vitamin D deficiency, unspecified: Secondary | ICD-10-CM | POA: Diagnosis not present

## 2020-02-10 DIAGNOSIS — E669 Obesity, unspecified: Secondary | ICD-10-CM | POA: Diagnosis not present

## 2020-02-12 DIAGNOSIS — M5416 Radiculopathy, lumbar region: Secondary | ICD-10-CM | POA: Diagnosis not present

## 2020-02-12 DIAGNOSIS — M48062 Spinal stenosis, lumbar region with neurogenic claudication: Secondary | ICD-10-CM | POA: Diagnosis not present

## 2020-02-12 DIAGNOSIS — M5136 Other intervertebral disc degeneration, lumbar region: Secondary | ICD-10-CM | POA: Diagnosis not present

## 2020-02-12 NOTE — Progress Notes (Signed)
Chief Complaint:   OBESITY Lauren Lloyd is here to discuss her progress with her obesity treatment plan along with follow-up of her obesity related diagnoses. [name] is on the Category 2 Plan and states she is following her eating plan approximately 90% of the time. [name] states she is exercising for 0 minutes 0 times per week.  Today's visit was #: 4 Starting weight: 213 lbs Starting date: 12/09/2019 Today's weight: 208 lbs Today's date: 02/10/2020 Total lbs lost to date: 5 lbs Total lbs lost since last in-office visit: 3  Interim History: [name] just got back from a 10-day trip to [place].  She got back on September 12.  She says she did a lot more walking than usual.  She has been on plan strictly since returning from vacation.  She is not weighing her proteins.  Subjective:   1. Essential hypertension Review: taking medications as instructed, no medication side effects noted, no chest pain on exertion, no dyspnea on exertion, no swelling of ankles.  She sees Dr. Candis Musa of Cardiology.  She is on Pacerone, Lasix potassium, and Eliquis.  Denies symptoms of low blood pressure or any concerns today.  BP Readings from Last 3 Encounters:  02/10/20 120/68  02/03/20 (!) 116/58  02/03/20 (!) 116/58   2. Vitamin D deficiency Doha's Vitamin D level was 24.4 on 12/09/2019. She is currently taking prescription vitamin D 50,000 IU each week. She denies nausea, vomiting or muscle weakness.  She got a 3 month refill from her PCP last time she saw her.  Assessment/Plan:   1. Essential hypertension [name] is working on healthy weight loss and exercise to improve blood pressure control. We will watch for signs of hypotension as she continues her lifestyle modifications.  Continue medications per Cardiology.  Weight loss, low salt meal plan.  Blood pressure is at goal.  2. Vitamin D deficiency Low Vitamin D level contributes to fatigue and are associated with obesity, breast, and colon cancer. She agrees  to continue to take prescription Vitamin D @50 ,000 IU every week and will follow-up for routine testing of Vitamin D, at least 2-3 times per year to avoid over-replacement.  Will continue to monitor.   3. Class 1 obesity with serious comorbidity and body mass index (BMI) of 33.0 to 33.9 in adult, unspecified obesity type  [name] is currently in the action stage of change. As such, her goal is to continue with weight loss efforts. She has agreed to the Category 2 Plan.   Exercise goals: As is.  Behavioral modification strategies: increasing lean protein intake, decreasing simple carbohydrates, meal planning and cooking strategies and planning for success.  [name] has agreed to follow-up with our clinic in 2 weeks. She was informed of the importance of frequent follow-up visits to maximize her success with intensive lifestyle modifications for her multiple health conditions.   Objective:   Blood pressure 120/68, pulse 72, temperature 98 F (36.7 C), height 5\' 6"  (1.676 m), weight 208 lb (94.3 kg), SpO2 100 %. Body mass index is 33.57 kg/m.  General: Cooperative, alert, well developed, in no acute distress. HEENT: Conjunctivae and lids unremarkable. Cardiovascular: Regular rhythm.  Lungs: Normal work of breathing. Neurologic: No focal deficits.   Lab Results  Component Value Date   CREATININE 0.85 12/09/2019   BUN 20 12/09/2019   NA 143 12/09/2019   K 4.7 12/09/2019   CL 103 12/09/2019   CO2 23 12/09/2019   Lab Results  Component Value Date   ALT  11 12/09/2019   AST 18 12/09/2019   ALKPHOS 106 12/09/2019   BILITOT 0.4 12/09/2019   Lab Results  Component Value Date   HGBA1C 5.5 12/09/2019   Lab Results  Component Value Date   INSULIN 10.4 12/09/2019   Lab Results  Component Value Date   TSH 0.500 12/09/2019   Lab Results  Component Value Date   CHOL 260 (H) 12/09/2019   HDL 73 12/09/2019   LDLCALC 168 (H) 12/09/2019   TRIG 112 12/09/2019   CHOLHDL 3.6 05/31/2018     Lab Results  Component Value Date   WBC 8.8 04/01/2019   HGB 13.0 04/01/2019   HCT 38.4 04/01/2019   MCV 90.8 04/01/2019   PLT 274 04/01/2019   Obesity Behavioral Intervention:   Approximately 15 minutes were spent on the discussion below.  ASK: We discussed the diagnosis of obesity with [name] today and [name] agreed to give [name] permission to discuss obesity behavioral modification therapy today.  ASSESS: Kalkidan has the diagnosis of obesity and her BMI today is 33.6. [name] is in the action stage of change.   ADVISE: [name] was educated on the multiple health risks of obesity as well as the benefit of weight loss to improve her health. She was advised of the need for long term treatment and the importance of lifestyle modifications to improve her current health and to decrease her risk of future health problems.  AGREE: Multiple dietary modification options and treatment options were discussed and [name] agreed to follow the recommendations documented in the above note.  ARRANGE: [name] was educated on the importance of frequent visits to treat obesity as outlined per CMS and USPSTF guidelines and agreed to schedule her next follow up appointment today.  Attestation Statements:   Reviewed by clinician on day of visit: allergies, medications, problem list, medical history, surgical history, family history, social history, and previous encounter notes.  I, Water quality scientist, CMA, am acting as Location manager for [employer], DO.  I have reviewed the above documentation for accuracy and completeness, and I agree with the above. [name], DO

## 2020-02-24 ENCOUNTER — Ambulatory Visit (INDEPENDENT_AMBULATORY_CARE_PROVIDER_SITE_OTHER): Payer: Medicare HMO | Admitting: Family Medicine

## 2020-02-24 ENCOUNTER — Encounter (INDEPENDENT_AMBULATORY_CARE_PROVIDER_SITE_OTHER): Payer: Self-pay | Admitting: Family Medicine

## 2020-02-24 ENCOUNTER — Other Ambulatory Visit: Payer: Self-pay

## 2020-02-24 VITALS — BP 125/70 | HR 68 | Temp 98.1°F | Ht 66.0 in | Wt 205.0 lb

## 2020-02-24 DIAGNOSIS — E88819 Insulin resistance, unspecified: Secondary | ICD-10-CM

## 2020-02-24 DIAGNOSIS — Z6833 Body mass index (BMI) 33.0-33.9, adult: Secondary | ICD-10-CM | POA: Diagnosis not present

## 2020-02-24 DIAGNOSIS — E669 Obesity, unspecified: Secondary | ICD-10-CM

## 2020-02-24 DIAGNOSIS — E66811 Obesity, class 1: Secondary | ICD-10-CM

## 2020-02-24 DIAGNOSIS — E559 Vitamin D deficiency, unspecified: Secondary | ICD-10-CM | POA: Diagnosis not present

## 2020-02-24 DIAGNOSIS — E8881 Metabolic syndrome: Secondary | ICD-10-CM | POA: Diagnosis not present

## 2020-02-26 NOTE — Progress Notes (Signed)
Chief Complaint:   OBESITY Lauren Lloyd is here to discuss her progress with her obesity treatment plan along with follow-up of her obesity related diagnoses. Lauren Lloyd is on the Category 2 Plan and states she is following her eating plan approximately 80% of the time. Lauren Lloyd states she is walking for 15 minutes 4 times per week.  Today's visit was #: 5 Starting weight: 213 lbs Starting date: 12/09/2019 Today's weight: 205 lbs Today's date: 02/24/2020 Total lbs lost to date: 8 lbs Total lbs lost since last in-office visit: 3 lbs  Interim History: Lauren Lloyd says she had more exercise this past week.  She raked the yard 3 days per week.  She feels she ate on plan more over the past 2 weeks.  She is feeling a little better and has more energy.  She increased her water intake as well and is getting 4 - 28 ounce bottles per day.  She is getting in 4 ounces of protein at lunch with chicken lunch meat, light bread, onion, bell peppers, and tomato.  Assessment/Plan:   1. Insulin resistance Lauren Lloyd has a diagnosis of insulin resistance based on her elevated fasting insulin level >5. She continues to work on diet and exercise to decrease her risk of diabetes.  She is not taking any medications for this at this time.  Plan:  Lauren Lloyd will continue to work on weight loss, exercise, and decreasing simple carbohydrates to help decrease the risk of diabetes. Lauren Lloyd agreed to follow-up with [name] as directed to closely monitor her progress.  Continue prudent nutritional plan and weight loss.    Lab Results  Component Value Date   INSULIN 10.4 12/09/2019   Lab Results  Component Value Date   HGBA1C 5.5 12/09/2019   2. Vitamin D deficiency Lauren Lloyd has a history of Vitamin D deficiency with resultant generalized fatigue as her primary symptom.  she is taking vitamin D 50,000 IU weekly for this deficiency and tolerating it well without side-effect.   Most recent Vitamin D lab reviewed-  level: 24.4.  Plan:   -  Discussed importance of vitamin D (as well as calcium) to their health and well-being.   - We reviewed possible symptoms of low Vitamin D including low energy, depressed mood, muscle aches, joint aches, osteoporosis etc.  - We discussed that low Vitamin D levels may be linked to an increased risk of cardiovascular events and even increased risk of cancers- such as colon and breast.   - Educated pt that weight loss will likely improve availability of vitamin D, thus encouraged Lauren Lloyd to continue with meal plan and their weight loss efforts to further improve this condition  - I recommend pt take a weekly prescription vit D- see script below- which pt agrees to after discussion of risks and benefits of this medication.      - Informed patient this may be a lifelong thing, and she was encouraged to continue to take the medicine until pt told otherwise.   We will need to monitor levels regularly ( q 3-4 mo on average )  to keep levels within normal limits.   - All pt's questions and concerns regarding this condition addressed  3. Class 1 obesity with serious comorbidity and body mass index (BMI) of 33.0 to 33.9 in adult, unspecified obesity type  Lauren Lloyd is currently in the action stage of change. As such, her goal is to continue with weight loss efforts. She has agreed to the Category 2 Plan.  Exercise goals: As is.  Behavioral modification strategies: decreasing eating out/eating at other's houses, meal planning and cooking strategies and planning for success.  Lauren Lloyd has agreed to follow-up with our clinic in 2-3 weeks. She was informed of the importance of frequent follow-up visits to maximize her success with intensive lifestyle modifications for her multiple health conditions.   Objective:   Blood pressure 125/70, pulse 68, temperature 98.1 F (36.7 C), height 5\' 6"  (1.676 m), weight 205 lb (93 kg), SpO2 95 %. Body mass index is 33.09 kg/m.  General: Cooperative, alert, well developed,  in no acute distress. HEENT: Conjunctivae and lids unremarkable. Cardiovascular: Regular rhythm.  Lungs: Normal work of breathing. Neurologic: No focal deficits.   Lab Results  Component Value Date   CREATININE 0.85 12/09/2019   BUN 20 12/09/2019   NA 143 12/09/2019   K 4.7 12/09/2019   CL 103 12/09/2019   CO2 23 12/09/2019   Lab Results  Component Value Date   ALT 11 12/09/2019   AST 18 12/09/2019   ALKPHOS 106 12/09/2019   BILITOT 0.4 12/09/2019   Lab Results  Component Value Date   HGBA1C 5.5 12/09/2019   Lab Results  Component Value Date   INSULIN 10.4 12/09/2019   Lab Results  Component Value Date   TSH 0.500 12/09/2019   Lab Results  Component Value Date   CHOL 260 (H) 12/09/2019   HDL 73 12/09/2019   LDLCALC 168 (H) 12/09/2019   TRIG 112 12/09/2019   CHOLHDL 3.6 05/31/2018   Lab Results  Component Value Date   WBC 8.8 04/01/2019   HGB 13.0 04/01/2019   HCT 38.4 04/01/2019   MCV 90.8 04/01/2019   PLT 274 04/01/2019   Obesity Behavioral Intervention:   Approximately 15 minutes were spent on the discussion below.  ASK: We discussed the diagnosis of obesity with Lauren Lloyd today and Lauren Lloyd agreed to give [name] permission to discuss obesity behavioral modification therapy today.  ASSESS: Lauren Lloyd has the diagnosis of obesity and her BMI today is 33.1. Lauren Lloyd is in the action stage of change.   ADVISE: Lauren Lloyd was educated on the multiple health risks of obesity as well as the benefit of weight loss to improve her health. She was advised of the need for long term treatment and the importance of lifestyle modifications to improve her current health and to decrease her risk of future health problems.  AGREE: Multiple dietary modification options and treatment options were discussed and Lauren Lloyd agreed to follow the recommendations documented in the above note.  ARRANGE: Lauren Lloyd was educated on the importance of frequent visits to treat obesity as outlined per CMS and  USPSTF guidelines and agreed to schedule her next follow up appointment today.  Attestation Statements:   Reviewed by clinician on day of visit: allergies, medications, problem list, medical history, surgical history, family history, social history, and previous encounter notes.  I, Water quality scientist, CMA, am acting as Location manager for [employer], DO.  I have reviewed the above documentation for accuracy and completeness, and I agree with the above. Lauren Lloyd, D.O.  The [place] was signed into law in 2016 which includes the topic of electronic health records.  This provides immediate access to information in MyChart.  This includes consultation notes, operative notes, office notes, lab results and pathology reports.  If you have any questions about what you read please let us know at your next visit so we can discuss your concerns and take  corrective action if need be.  We are right here with you.

## 2020-03-06 DIAGNOSIS — G4733 Obstructive sleep apnea (adult) (pediatric): Secondary | ICD-10-CM | POA: Diagnosis not present

## 2020-03-10 ENCOUNTER — Ambulatory Visit: Payer: Medicare HMO

## 2020-03-10 ENCOUNTER — Telehealth: Payer: Self-pay | Admitting: *Deleted

## 2020-03-10 ENCOUNTER — Ambulatory Visit (INDEPENDENT_AMBULATORY_CARE_PROVIDER_SITE_OTHER): Payer: Medicare HMO

## 2020-03-10 ENCOUNTER — Other Ambulatory Visit: Payer: Self-pay

## 2020-03-10 DIAGNOSIS — I4819 Other persistent atrial fibrillation: Secondary | ICD-10-CM

## 2020-03-10 DIAGNOSIS — R55 Syncope and collapse: Secondary | ICD-10-CM

## 2020-03-10 IMAGING — MR MRI LUMBAR SPINE WITHOUT CONTRAST
4 of 5 series · 18 of 48 positions shown · non-contrast
Comparison: CT of the lumbar spine

CLINICAL DATA: Spondylolisthesis of the lumbar region.

EXAM:
MRI LUMBAR SPINE WITHOUT CONTRAST
TECHNIQUE: Multiplanar, multisequence MR imaging of the lumbar spine was
performed. No intravenous contrast was administered.

[Series 3: T2 · sagittal · 4.0mm · 0.55mm/px · 8 of 18 slices shown (1 of 2)]
[im 1/18]
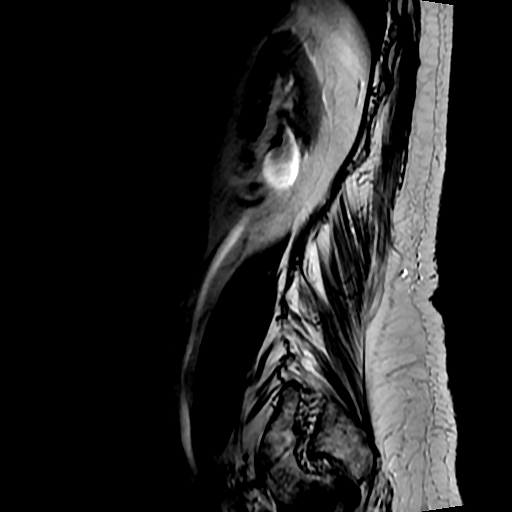
[im 3/18]
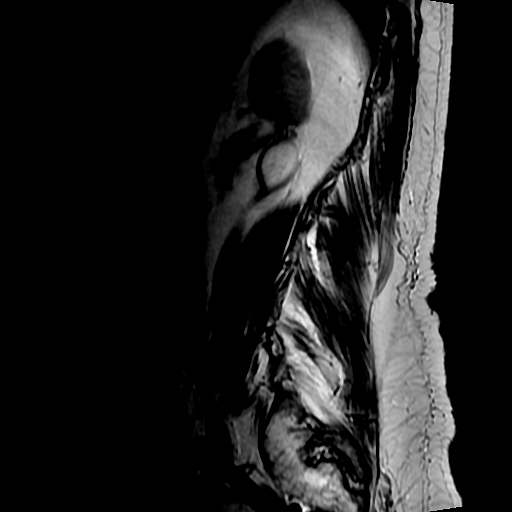
[im 5/18]
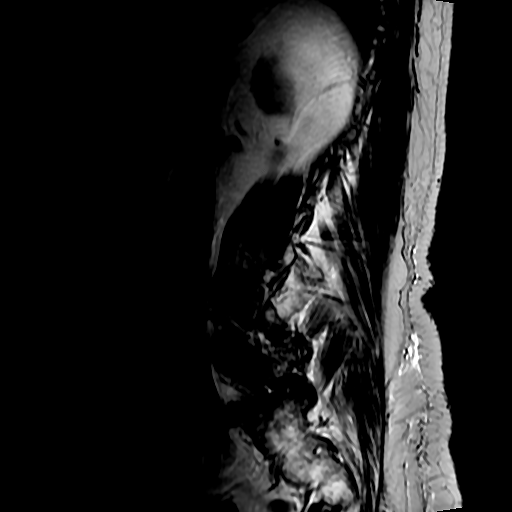
[im 8/18]
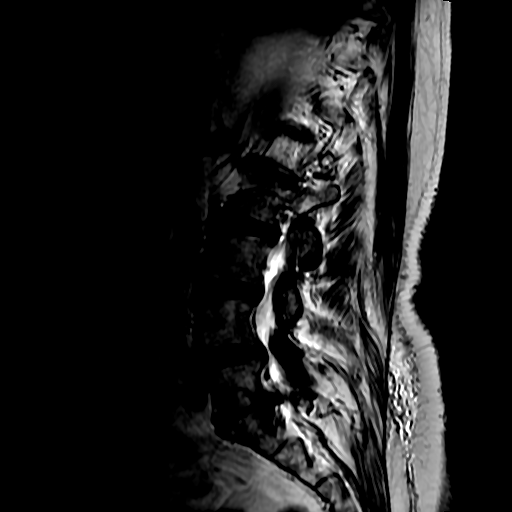
[im 10/18]
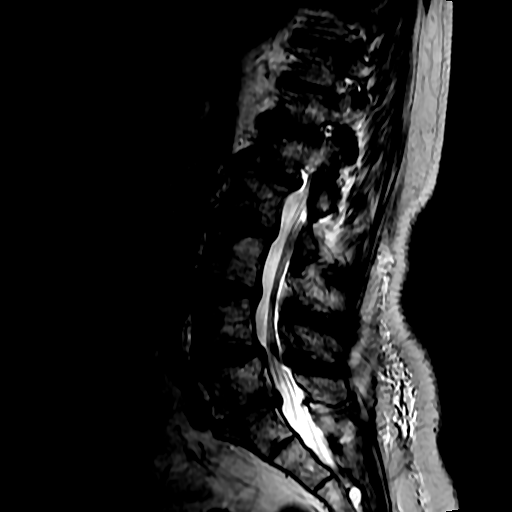
[im 13/18]
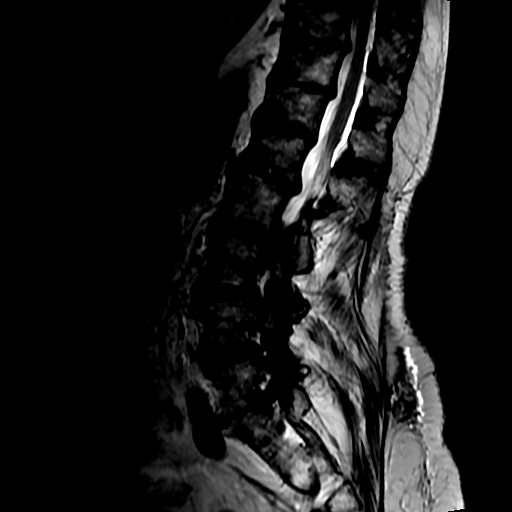
[im 15/18]
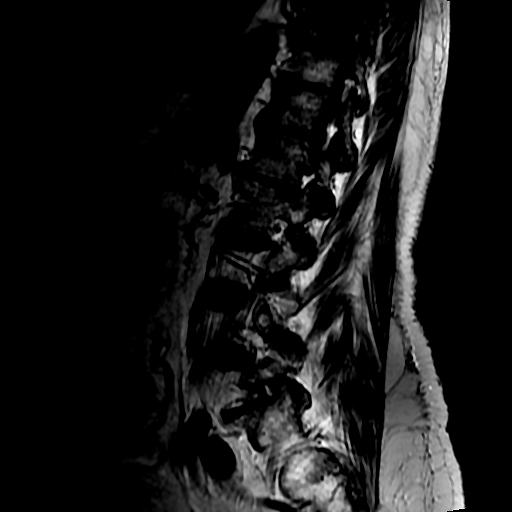
[im 18/18]
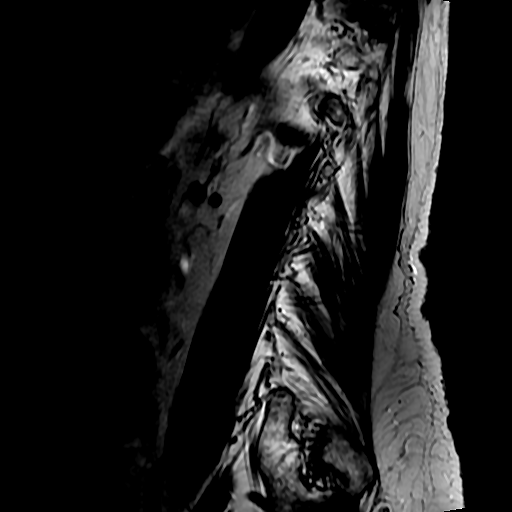

[Series 5: T1 · sagittal · 4.0mm · 0.55mm/px · 3 of 18 slices shown (1 of 2)]
[im 3/18]
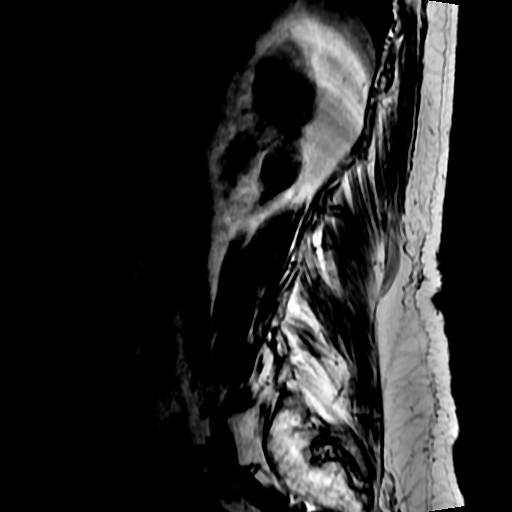
[im 9/18]
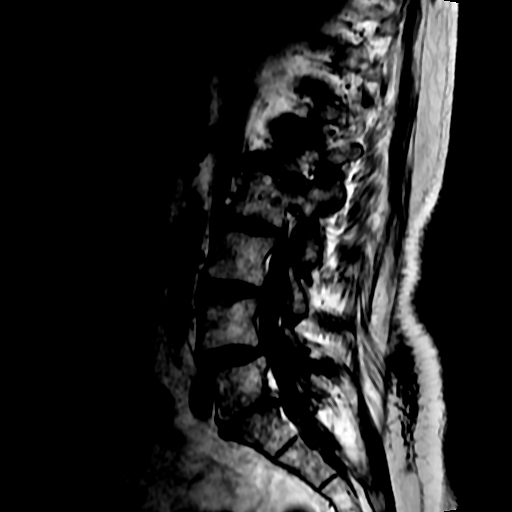
[im 15/18]
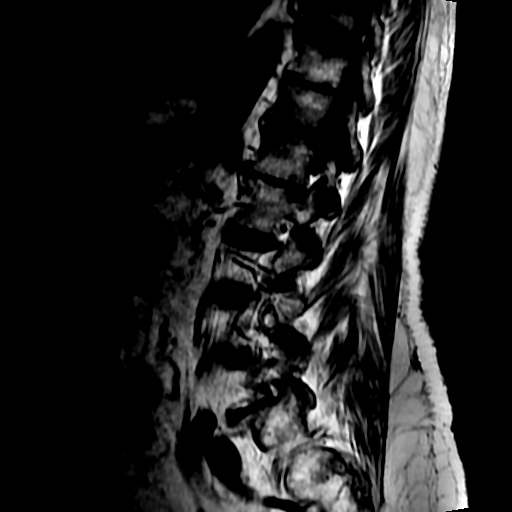

[Series 6: T2 · axial · 4.0mm · 0.47mm/px · z∈[-16,+167]mm · 4 of 34 slices shown (2 of 2)]
[im 1/34]
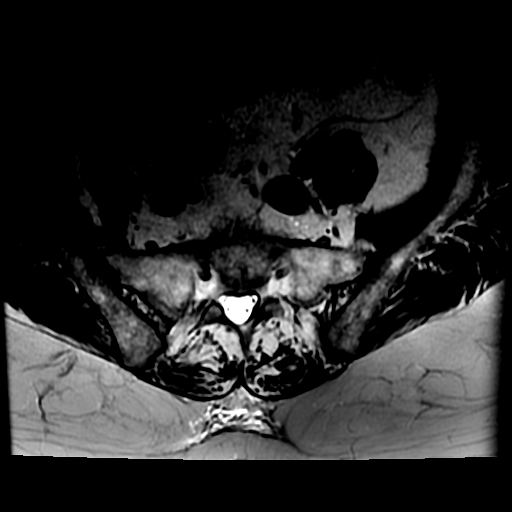
[im 6/34]
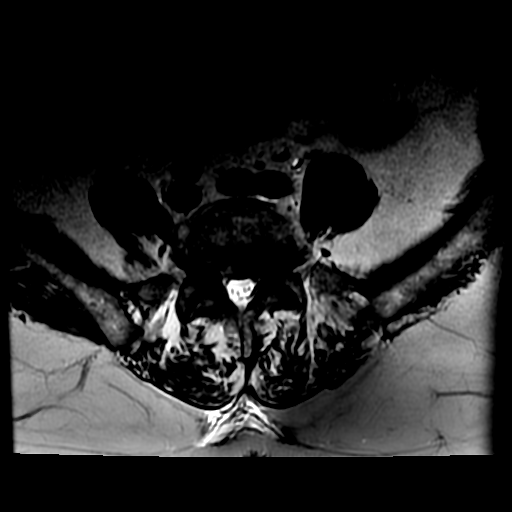
[im 17/34]
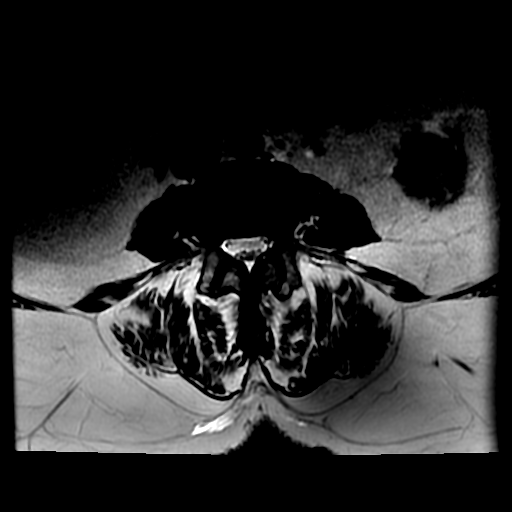
[im 28/34]
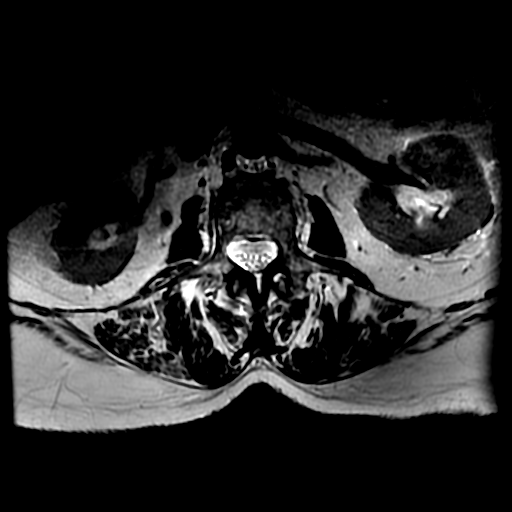

[Series 7: T1 · axial · 4.0mm · 0.47mm/px · z∈[+8,+167]mm · 3 of 34 slices shown (2 of 2)]
[im 6/34]
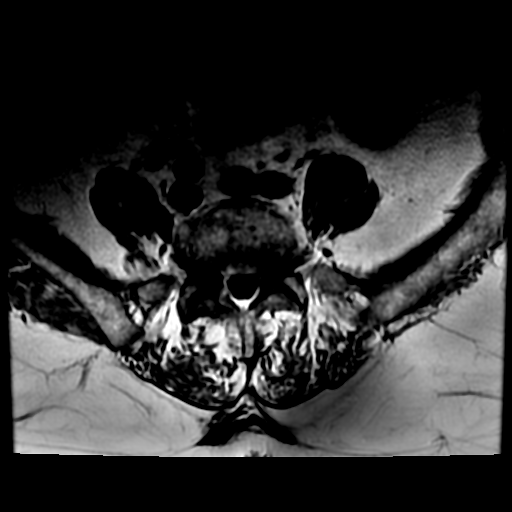
[im 17/34]
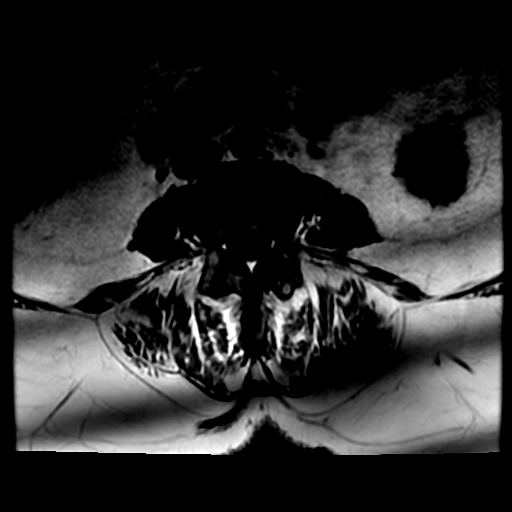
[im 28/34]
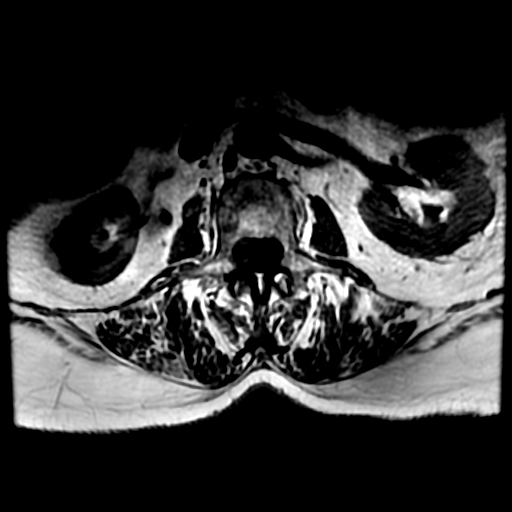

[18 of 48 positions shown; findings below may reference images not displayed]

FINDINGS: Segmentation: 5 non rib-bearing lumbar type vertebral bodies are
present. The lowest fully formed vertebral body is L5.

Alignment: Slight degenerative anterolisthesis is present at L4-5.
There is slight retrolisthesis at L1-2 and L2-3. Dextroconvex
curvature is centered at L4-5. Levoconvex curvature is centered at
T12.

Vertebrae: Chronic fatty endplate marrow changes are present at
L5-S1, right greater than left inferior endplate Schmorl's node is
present at L2. There is chronic edematous endplate marrow changes at
T10-11 and T12-L1 findings at T12-L1 are worse on the right.

Conus medullaris and cauda equina: Conus extends to the L1-2 level.
Conus and cauda equina appear normal.

Paraspinal and other soft tissues: Limited imaging the abdomen is
unremarkable. There is no significant adenopathy. No solid organ
lesions are present.

Disc levels:

L1-2: There is uncovering of a broad-based disc protrusion moderate
facet hypertrophy is noted bilaterally. The central canal is patent.
Mild foraminal narrowing is worse on the right.

L2-3: A broad-based disc protrusion is present. Mild facet
hypertrophy is noted bilaterally. Central canal is patent. Mild
foraminal narrowing is worse on left.

L3-4: A broad-based disc protrusion is present. Moderate facet
hypertrophy is worse on the left. There is slight distortion of the
central canal. Mild foraminal narrowing is noted bilaterally.

L4-5: There is uncovering of a broad-based disc protrusion. Moderate
facet hypertrophy is noted bilaterally. This results in moderate
central canal stenosis with subarticular narrowing bilaterally.
Moderate left and mild right foraminal narrowing is present.

L5-S1: There is chronic loss of disc height. Mild broad-based disc
bulge is present. Facet hypertrophy is worse on the right. The
central canal is patent. Facet spurring contributes to moderate
right and mild left foraminal stenosis.
IMPRESSION: 1. Multilevel spondylosis of the lumbar spine.
2. S shaped scoliosis of the lumbar spine is convex to the left at
L4 and to the right at T12
3. Mild foraminal narrowing bilaterally at L1-2, L2-3, and L3-4.
4. Moderate central canal stenosis at L4-5 with moderate left and
mild right foraminal stenosis.
5. Moderate right and left foraminal narrowing at L5-S1 without
significant stenosis.

## 2020-03-10 NOTE — Telephone Encounter (Signed)
Pt refused AT monitor today. Pt is out in the yard everyday and doesn't want to carry any device around or module. Pt mentioned that she doesn't do well with carrying things around and working in the yard. Discussed with Dr. Donivan Scull nurse and she was ok with CMA's placing regular XT zio monitor. Pt is aware that if she has any issues while wearing monitor we are unable to see any results until monitor is received from pt. She agreed and is ok with having XT zio monitor placed and is aware to wear for 14 days.  *Note sent to Romualdo Bolk, RN to have order changed to XT monitor.

## 2020-03-10 NOTE — Telephone Encounter (Signed)
ZIO XT monitor placed today at 10:55 AM. To be worn until 03/24/20.

## 2020-03-10 NOTE — Telephone Encounter (Signed)
Patient refused to wear AT monitor given that she has to carry additional box for transmission. Advised that they place XT monitor and that I would notify providers as well.

## 2020-03-16 ENCOUNTER — Telehealth: Payer: Self-pay | Admitting: Cardiovascular Disease

## 2020-03-16 ENCOUNTER — Ambulatory Visit: Payer: Medicare HMO | Admitting: Cardiovascular Disease

## 2020-03-16 DIAGNOSIS — M48062 Spinal stenosis, lumbar region with neurogenic claudication: Secondary | ICD-10-CM | POA: Diagnosis not present

## 2020-03-16 DIAGNOSIS — M5416 Radiculopathy, lumbar region: Secondary | ICD-10-CM | POA: Diagnosis not present

## 2020-03-16 DIAGNOSIS — M5136 Other intervertebral disc degeneration, lumbar region: Secondary | ICD-10-CM | POA: Diagnosis not present

## 2020-03-16 NOTE — Telephone Encounter (Signed)
Left message to call office

## 2020-03-16 NOTE — Telephone Encounter (Signed)
Patient states she is having some back issues and her doctor has prescribed Celebrex . Please advise if patient can take this.

## 2020-03-16 NOTE — Telephone Encounter (Signed)
Celebrex can increase risk of GI bleeding since pt also takes Eliquis. She has previously tried gabapentin, Percocet, and epidural steroid injections for her back pain. Recommend using lowest effective dose of Celebrex for the shortest required time. Depending on how large her area of pain is, could also try topical NSAID like Voltaren gel that has less systemic absorption.

## 2020-03-17 NOTE — Telephone Encounter (Signed)
Pt aware and verbalizes understanding./cy ?

## 2020-03-18 ENCOUNTER — Encounter (INDEPENDENT_AMBULATORY_CARE_PROVIDER_SITE_OTHER): Payer: Self-pay | Admitting: Family Medicine

## 2020-03-18 ENCOUNTER — Other Ambulatory Visit: Payer: Self-pay

## 2020-03-18 ENCOUNTER — Ambulatory Visit (INDEPENDENT_AMBULATORY_CARE_PROVIDER_SITE_OTHER): Payer: Medicare HMO | Admitting: Family Medicine

## 2020-03-18 VITALS — BP 142/60 | HR 51 | Temp 98.0°F | Ht 66.0 in | Wt 208.0 lb

## 2020-03-18 DIAGNOSIS — E669 Obesity, unspecified: Secondary | ICD-10-CM | POA: Diagnosis not present

## 2020-03-18 DIAGNOSIS — Z6833 Body mass index (BMI) 33.0-33.9, adult: Secondary | ICD-10-CM | POA: Diagnosis not present

## 2020-03-18 DIAGNOSIS — E559 Vitamin D deficiency, unspecified: Secondary | ICD-10-CM

## 2020-03-18 DIAGNOSIS — I10 Essential (primary) hypertension: Secondary | ICD-10-CM | POA: Diagnosis not present

## 2020-03-18 MED ORDER — VITAMIN D (ERGOCALCIFEROL) 1.25 MG (50000 UNIT) PO CAPS: 50000.0000 [IU] | ORAL_CAPSULE | ORAL | 0 refills | Status: DC

## 2020-03-18 NOTE — Progress Notes (Signed)
Chief Complaint:   OBESITY Lauren Lloyd is here to discuss her progress with her obesity treatment plan along with follow-up of her obesity related diagnoses. Gearline is on the Category 2 Plan and states she is following her eating plan approximately 50% of the time. [name] states she is swimming for 30 minutes 1 times per week.  Today's visit was #: 6 Starting weight: 213 lbs Starting date: 12/09/2019 Today's weight: 208 lbs Today's date: 03/21/2020 Total lbs lost to date: 5 lbs Total lbs lost since last in-office visit: 3 lbs Total weight loss percentage to date: -2.35%  Interim History:  It has been 3 weeks since [name]'s last office visit.  She went on a trip and had two family weddings and ate a lot off plan.  She also had a steroid injection in her back around 5 days ago, for which she was laid up for a little while for.  No upcoming travel or events.    Assessment/Plan:   1. Vitamin D deficiency Maggi's Vitamin D level was 24.4 on 12/09/2019. She is currently taking prescription vitamin D 50,000 IU each week. She denies nausea, vomiting or muscle weakness.  Tolerating well with no side effects or concerns.  Plan:  She says she needs a refill of this medication.  Will refill today.  -Refill Vitamin D, Ergocalciferol, (DRISDOL) 1.25 MG (50000 UNIT) CAPS capsule; Take 1 capsule (50,000 Units total) by mouth every 7 (seven) days.  Dispense: 4 capsule; Refill: 0  2. Essential hypertension She is taking bisoprolol 2.5 mg daily, amiodarone, Lasix, and potassium.  She says she ate [place] food within the past couple of days, which contained a lot of salt.  She is not checking her blood pressure at home and does not want to because it just takes time.  Denies CP, SOB, HA, etc.  Plan:  Home blood pressure monitoring 2-3 days per week strongly encouraged.  Decrease salt, weight loss, and medication compliance all discussed with her.  BP Readings from Last 3 Encounters:  03/18/20 (!) 142/60    02/24/20 125/70  02/10/20 120/68   3. Class 1 obesity with serious comorbidity and body mass index (BMI) of 33.0 to 33.9 in adult, unspecified obesity type  [name] is currently in the action stage of change. As such, her goal is to continue with weight loss efforts. She has agreed to the Category 2 Plan.   Exercise goals: Older adults should follow the adult guidelines. When older adults cannot meet the adult guidelines, they should be as physically active as their abilities and conditions will allow.  Older adults should do exercises that maintain or improve balance if they are at risk of falling.  Increase as tolearted to do something daily.  Behavioral modification strategies: increasing lean protein intake, travel eating strategies, celebration eating strategies and planning for success.  [name] has agreed to follow-up with our clinic in 2 weeks. She was informed of the importance of frequent follow-up visits to maximize her success with intensive lifestyle modifications for her multiple health conditions.   Objective:   Blood pressure (!) 142/60, pulse (!) 51, temperature 98 F (36.7 C), height 5\' 6"  (1.676 m), weight 208 lb (94.3 kg), SpO2 96 %. Body mass index is 33.57 kg/m.  General: Cooperative, alert, well developed, in no acute distress. HEENT: Conjunctivae and lids unremarkable. Cardiovascular: Regular rhythm.  Lungs: Normal work of breathing. Neurologic: No focal deficits.   Lab Results  Component Value Date   CREATININE 0.85 12/09/2019  BUN 20 12/09/2019   NA 143 12/09/2019   K 4.7 12/09/2019   CL 103 12/09/2019   CO2 23 12/09/2019   Lab Results  Component Value Date   ALT 11 12/09/2019   AST 18 12/09/2019   ALKPHOS 106 12/09/2019   BILITOT 0.4 12/09/2019   Lab Results  Component Value Date   HGBA1C 5.5 12/09/2019   Lab Results  Component Value Date   INSULIN 10.4 12/09/2019   Lab Results  Component Value Date   TSH 0.500 12/09/2019   Lab Results   Component Value Date   CHOL 260 (H) 12/09/2019   HDL 73 12/09/2019   LDLCALC 168 (H) 12/09/2019   TRIG 112 12/09/2019   CHOLHDL 3.6 05/31/2018   Lab Results  Component Value Date   WBC 8.8 04/01/2019   HGB 13.0 04/01/2019   HCT 38.4 04/01/2019   MCV 90.8 04/01/2019   PLT 274 04/01/2019   Obesity Behavioral Intervention:   Approximately 15 minutes were spent on the discussion below.  ASK: We discussed the diagnosis of obesity with Bleu today and Shama agreed to give [name] permission to discuss obesity behavioral modification therapy today.  ASSESS: [name] has the diagnosis of obesity and her BMI today is 33.6. [name] is in the action stage of change.   ADVISE: [name] was educated on the multiple health risks of obesity as well as the benefit of weight loss to improve her health. She was advised of the need for long term treatment and the importance of lifestyle modifications to improve her current health and to decrease her risk of future health problems.  AGREE: Multiple dietary modification options and treatment options were discussed and [name] agreed to follow the recommendations documented in the above note.  ARRANGE: [name] was educated on the importance of frequent visits to treat obesity as outlined per CMS and USPSTF guidelines and agreed to schedule her next follow up appointment today.  Attestation Statements:   Reviewed by clinician on day of visit: allergies, medications, problem list, medical history, surgical history, family history, social history, and previous encounter notes.  I, Water quality scientist, CMA, am acting as Location manager for [employer], DO.  I have reviewed the above documentation for accuracy and completeness, and I agree with the above. Marjory Sneddon, D.O.  The [place] was signed into law in 2016 which includes the topic of electronic health records.  This provides immediate access to information in MyChart.  This includes  consultation notes, operative notes, office notes, lab results and pathology reports.  If you have any questions about what you read please let us know at your next visit so we can discuss your concerns and take corrective action if need be.  We are right here with you.

## 2020-03-19 ENCOUNTER — Other Ambulatory Visit: Payer: Self-pay | Admitting: Family Medicine

## 2020-03-31 ENCOUNTER — Ambulatory Visit (INDEPENDENT_AMBULATORY_CARE_PROVIDER_SITE_OTHER): Payer: Medicare HMO | Admitting: Family Medicine

## 2020-04-03 ENCOUNTER — Ambulatory Visit: Payer: Medicare HMO

## 2020-04-03 DIAGNOSIS — I10 Essential (primary) hypertension: Secondary | ICD-10-CM

## 2020-04-03 DIAGNOSIS — E7800 Pure hypercholesterolemia, unspecified: Secondary | ICD-10-CM

## 2020-04-03 NOTE — Patient Instructions (Signed)
Visit Information It was great speaking with you today!  Please let me know if you have any questions about our visit. Goals Addressed            This Visit's Progress    Chronic Care Management       CARE PLAN ENTRY (see longitudinal plan of care for additional care plan information)  Current Barriers:   Chronic Disease Management support, education, and care coordination needs related to Hypertension, Hyperlipidemia, and Atrial Fibrillation   Hypertension BP Readings from Last 3 Encounters:  12/23/19 (!) 138/48  12/09/19 (!) 140/80  11/08/19 110/62    Pharmacist Clinical Goal(s): o Over the next 90 days, patient will work with PharmD and providers to maintain BP goal <140/90  Current regimen:  o Bisprolol 2.5mg  twice daily  Interventions: o None  Patient self care activities - Over the next 90 days, patient will: o Check BP weekly, document, and provide at future appointments o Ensure daily salt intake < 2300 mg/day  Hyperlipidemia Lab Results  Component Value Date/Time   LDLCALC 168 (H) 12/09/2019 02:11 PM    Pharmacist Clinical Goal(s): o Over the next 90 days, patient will work with PharmD and providers to achieve LDL goal < 100  Current regimen:  o None  Interventions: o None  Patient self care activities - Over the next 90 days, patient will: o Increase lifestyle modifications to lower cholesterol  Chronic Pain  Pharmacist Clinical Goal(s) o Over the next 90 days, patient will work with PharmD and providers to reduce pain  Current regimen:  o Gabapentin 300mg  daily  Interventions: o None  Patient self care activities - Over the next 90 days, patient will: o Report pain level to PharmD and provider o Provide feedback on any medication changes to reduce pain  Medication management  Pharmacist Clinical Goal(s): o Over the next 90 days, patient will work with PharmD and providers to maintain optimal medication adherence  Current pharmacy:  [facility]  Interventions o Comprehensive medication review performed. o Continue current medication management strategy  Patient self care activities - Over the next 90 days, patient will: o Focus on medication adherence by continuing current practices o Take medications as prescribed o Report any questions or concerns to PharmD and/or provider(s)       The patient verbalized understanding of instructions, educational materials, and care plan provided today and declined offer to receive copy of patient instructions, educational materials, and care plan.   The pharmacy team will reach out to the patient again over the next 30 days.   [place] [phone]

## 2020-04-03 NOTE — Chronic Care Management (AMB) (Signed)
Chronic Care Management Pharmacy  Name: Lauren Lloyd  MRN: [id number] DOB: [birth year]  Chief Complaint/ HPI  Lauren Lloyd,  79 y.o. , female presents for their Follow-Up CCM visit with the clinical pharmacist via telephone   PCP : [name], MD  Their chronic conditions include: Afib, HTN, HLD  Office Visits: 02/03/20: Patient presented to Dr. Brita Romp for follow-up. No medication changes made.  6/25 dizziness, Bacigalupo, BP 110/62 P 40 wt 216 BMI 33.83, chronic dizziness, waxing and waning, CPAP non-adherent, wt referred to [facility], Dec bisoprolol 2.22m bid, knee inj today,   Consult Visit: 03/18/20: [facility].  02/24/20: Patient presented to Dr. ORaliegh Scarletfor follow-up. Gabapentin stopped.  8/9 HTN, [place]alski, BP 138/48 P 43, Wt 211 BMI 34.1, diet nonadherent, LDL 168H, A1c 5.5%  Medications: Outpatient Encounter Medications as of 04/03/2020  Medication Sig Note  . amiodarone (PACERONE) 100 MG tablet Take 1 tablet (100 mg total) by mouth daily.   . bisoprolol (ZEBETA) 5 MG tablet Take 0.5 tablet (2.5 mg) by mouth once daily   . ELIQUIS 5 MG TABS tablet TAKE ONE TABLET BY MOUTH TWICE DAILY   . furosemide (LASIX) 40 MG tablet Take 1 tablet (40 mg total) by mouth 2 (two) times daily. Take twice a day anytime weight is 218 or higher. (Patient taking differently: Take 40 mg by mouth daily. Take twice a day anytime weight is 218 or higher.)   . gabapentin (NEURONTIN) 300 MG capsule TAKE ONE CAPSULE BY MOUTH AT BEDTIME   . loperamide (IMODIUM) 2 MG capsule Take 2 mg by mouth as needed.    . metaxalone (SKELAXIN) 800 MG tablet Take 800 mg by mouth 3 (three) times daily.  02/03/2020: As needed  . Multiple Vitamin (MULTIVITAMIN) capsule Take 1 capsule by mouth daily.   . potassium chloride (KLOR-CON) 10 MEQ tablet Take 1 tablet (10 mEq total) by mouth daily.   . Vitamin D, Ergocalciferol, (DRISDOL) 1.25 MG (50000 UNIT) CAPS  capsule Take 1 capsule (50,000 Units total) by mouth every 7 (seven) days.    No facility-administered encounter medications on file as of 04/03/2020.      Financial Resource Strain: High Risk  . Difficulty of Paying Living Expenses: Hard    Current Diagnosis/Assessment:  SDOH Interventions     Most Recent Value  SDOH Interventions  Financial Strain Interventions Other (Comment)  [Will start PAP]  Transportation Interventions Intervention Not Indicated       Goals Addressed            This Visit's Progress   . Chronic Care Management       CARE PLAN ENTRY (see longitudinal plan of care for additional care plan information)  Current Barriers:  . Chronic Disease Management support, education, and care coordination needs related to Hypertension, Hyperlipidemia, and Atrial Fibrillation   Hypertension BP Readings from Last 3 Encounters:  12/23/19 (!) 138/48  12/09/19 (!) 140/80  11/08/19 110/62   . Pharmacist Clinical Goal(s): o Over the next 90 days, patient will work with PharmD and providers to maintain BP goal <140/90 . Current regimen:  o Bisprolol 2.529mtwice daily . Interventions: o None . Patient self care activities - Over the next 90 days, patient will: o Check BP weekly, document, and provide at future appointments o Ensure daily salt intake < 2300 mg/day  Hyperlipidemia Lab Results  Component Value Date/Time   LDLCALC 168 (H) 12/09/2019 02:11 PM   .  Pharmacist Clinical Goal(s): o Over the next 90 days, patient will work with PharmD and providers to achieve LDL goal < 100 . Current regimen:  o None . Interventions: o None . Patient self care activities - Over the next 90 days, patient will: o Increase lifestyle modifications to lower cholesterol  Chronic Pain . Pharmacist Clinical Goal(s) o Over the next 90 days, patient will work with PharmD and providers to reduce pain . Current regimen:  o Gabapentin 380m  daily . Interventions: o None . Patient self care activities - Over the next 90 days, patient will: o Report pain level to PharmD and provider o Provide feedback on any medication changes to reduce pain  Medication management . Pharmacist Clinical Goal(s): o Over the next 90 days, patient will work with PharmD and providers to maintain optimal medication adherence . Current pharmacy: [facility] . Interventions o Comprehensive medication review performed. o Continue current medication management strategy . Patient self care activities - Over the next 90 days, patient will: o Focus on medication adherence by continuing current practices o Take medications as prescribed o Report any questions or concerns to PharmD and/or provider(s)      Hyperlipidemia   LDL goal < 100  Lipid Panel     Component Value Date/Time   CHOL 260 (H) 12/09/2019 1411   TRIG 112 12/09/2019 1411   HDL 73 12/09/2019 1411   LDLCALC 168 (H) 12/09/2019 1411    Hepatic Function Latest Ref Rng & Units 12/09/2019 07/15/2019 04/01/2019  Total Protein 6.0 - 8.5 g/dL 6.8 6.2 7.0  Albumin 3.7 - 4.7 g/dL 4.3 4.1 4.0  AST 0 - 40 IU/L '18 14 23  ' ALT 0 - 32 IU/L '11 13 27  ' Alk Phosphatase 48 - 121 IU/L 106 105 81  Total Bilirubin 0.0 - 1.2 mg/dL 0.4 0.4 0.8     Patient has failed these meds in past: NA Patient is currently uncontrolled on the following medications:  . None  We discussed: Patient had been working with weight management to lose weight and make healthier choices. However, patient reports she was 8 minutes late to her last appointment and was not able to be seen so she is thinking of discontinuing the program entirely. Encourage patient to reach back out to the weight loss clinic to reschedule as she verbalized she had been having success working with Dr. ORaliegh Scarlet   Plan  Continue control with diet and exercise  Hypertension   BP goal is:  <140/90  Office blood pressures are  BP Readings from Last 3  Encounters:  03/18/20 (!) 142/60  02/24/20 125/70  02/10/20 120/68   Patient checks BP at home infrequently Patient home BP readings are ranging: NA   Patient has failed these meds in the past: NA Patient is currently controlled on the following medications:  . Bisoprolol 2.547mtwice daily  We discussed: Not monitoring regularly, denies symptoms of hypo/hypertension. Counseled on importance of routine monitoring.   Plan  Continue current medications   AFIB   Managed by Dr. GoRockey SituPatient is currently rhythm controlled.  Patient has failed these meds in past: n/a Patient is currently controlled on the following medications:   . Amiodarone 100 mg daily . Bisoprolol 2.54m16mwice daily . Eliquis 5 mg twice daily   We discussed: Eliquis has been challenging to afford, her son has been paying for it.   Plan  Continue current medications  Will start PAP for Eliquis and forward to Dr. GolRockey Situ  Chronic Pain   Patient has failed these meds in past: NA Patient is currently uncontrolled on the following medications:  . Gabapentin 369m daily . Metaxalone 800 mg TID   We discussed: Gabapentin not helpful with back pain  Plan  Continue current medications   Medication Management   Pt [facility] Uses pill box? Yes Pt endorses 100% compliance   We discussed:  Daughter in law is M[employer]ist, research (physical sciences) Not interested in switching.  Plan  Continue current medication management strategy  Follow up: 6 month phone visit  [phone]

## 2020-04-04 DIAGNOSIS — I4819 Other persistent atrial fibrillation: Secondary | ICD-10-CM | POA: Diagnosis not present

## 2020-04-06 ENCOUNTER — Telehealth: Payer: Self-pay

## 2020-04-06 DIAGNOSIS — G4733 Obstructive sleep apnea (adult) (pediatric): Secondary | ICD-10-CM | POA: Diagnosis not present

## 2020-04-06 NOTE — Progress Notes (Signed)
    Chronic Care Management Pharmacy Assistant   Name: Lauren Lloyd  MRN: [id number] DOB: 1940-10-29  Reason for Encounter: Medication Management  Patient Questions:  1.  Have you seen any other providers since your last visit? No  2.  Any changes in your medicines or health? No   Lauren Lloyd,  79 y.o. , female presents for their Follow-Up CCM visit with the clinical pharmacist via telephone.  PCP : [name], MD  Allergies:   Allergies  Allergen Reactions  . Levofloxacin     Other reaction(s): Joint Pains Other reaction(s): Other (See Comments) Joint pain  . Influenza Vaccines Other (See Comments)    Bell's Palsy  . Oysters [Shellfish Allergy] Swelling    She states she had eaten them three days in a row and she developed swelling around her eyes.     Medications: Outpatient Encounter Medications as of 04/06/2020  Medication Sig Note  . amiodarone (PACERONE) 100 MG tablet Take 1 tablet (100 mg total) by mouth daily.   . bisoprolol (ZEBETA) 5 MG tablet Take 0.5 tablet (2.5 mg) by mouth once daily   . ELIQUIS 5 MG TABS tablet TAKE ONE TABLET BY MOUTH TWICE DAILY   . furosemide (LASIX) 40 MG tablet Take 1 tablet (40 mg total) by mouth 2 (two) times daily. Take twice a day anytime weight is 218 or higher. (Patient taking differently: Take 40 mg by mouth daily. Take twice a day anytime weight is 218 or higher.)   . gabapentin (NEURONTIN) 300 MG capsule TAKE ONE CAPSULE BY MOUTH AT BEDTIME   . loperamide (IMODIUM) 2 MG capsule Take 2 mg by mouth as needed.    . metaxalone (SKELAXIN) 800 MG tablet Take 800 mg by mouth 3 (three) times daily.  02/03/2020: As needed  . Multiple Vitamin (MULTIVITAMIN) capsule Take 1 capsule by mouth daily.   . potassium chloride (KLOR-CON) 10 MEQ tablet Take 1 tablet (10 mEq total) by mouth daily.   . Vitamin D, Ergocalciferol, (DRISDOL) 1.25 MG (50000 UNIT) CAPS capsule Take 1 capsule (50,000 Units total) by mouth every 7 (seven) days.     No facility-administered encounter medications on file as of 04/06/2020.    Current Diagnosis: Patient Assistance Coordination   Follow-Up: Patient Assistant    Mailed application for Eliquis patient assistance to patient. Patient to complete and return it and any needed supporting documents for so that Ida Rogue can provide prescription for application. Patient may contact me if any questions, phone number provided.

## 2020-04-13 DIAGNOSIS — M48062 Spinal stenosis, lumbar region with neurogenic claudication: Secondary | ICD-10-CM | POA: Diagnosis not present

## 2020-04-13 DIAGNOSIS — M5136 Other intervertebral disc degeneration, lumbar region: Secondary | ICD-10-CM | POA: Diagnosis not present

## 2020-04-13 DIAGNOSIS — M47816 Spondylosis without myelopathy or radiculopathy, lumbar region: Secondary | ICD-10-CM | POA: Diagnosis not present

## 2020-04-13 DIAGNOSIS — M5416 Radiculopathy, lumbar region: Secondary | ICD-10-CM | POA: Diagnosis not present

## 2020-04-13 NOTE — Telephone Encounter (Signed)
Patient is calling back to discuss her blood thinner and her back problems. Please call to discuss.

## 2020-04-14 NOTE — Telephone Encounter (Signed)
Patient also would not like a voicemail left but to have the nurse call back right away so she can get to her phone. Patient had complaints about wait times calling into the office

## 2020-04-14 NOTE — Telephone Encounter (Addendum)
Spoke with the patient.  Patient sts that she was seen by her orthopedic recently for her chronic back pain. They are recommending that the patient have a trial with Celebrex to see if it is beneficial to help avoid her possibly needing a more evasive procedure.  Reiterated to the patient Fuller [place], PharmD response and recommendation on 03/16/20. Pt verbalized understanding and voiced appreciation for the call back.

## 2020-04-14 NOTE — Telephone Encounter (Signed)
Patient calling needing to discuss taking celebrex

## 2020-04-14 NOTE — Telephone Encounter (Signed)
Left voicemail message to call back  

## 2020-04-17 ENCOUNTER — Other Ambulatory Visit: Payer: Self-pay | Admitting: *Deleted

## 2020-04-17 MED ORDER — POTASSIUM CHLORIDE ER 10 MEQ PO TBCR
10.0000 meq | EXTENDED_RELEASE_TABLET | Freq: Every day | ORAL | 0 refills | Status: DC
Start: 2020-04-17 — End: 2020-04-20

## 2020-04-18 ENCOUNTER — Other Ambulatory Visit: Payer: Self-pay | Admitting: Cardiovascular Disease

## 2020-04-20 ENCOUNTER — Ambulatory Visit: Payer: Medicare HMO | Admitting: Cardiovascular Disease

## 2020-04-22 ENCOUNTER — Telehealth: Payer: Self-pay

## 2020-04-22 NOTE — Telephone Encounter (Signed)
-----   Message from Theora Gianotti, NP sent at 04/22/2020  4:06 PM EST ----- 20 brief episodes of fast heart beats stemming from the top chambers.  In general, these were rare and did not apparently elicit a symptom (no patient triggers reported).  No afib seen.  It's possible that longer episodes of fast heart beats (also known as SVT) could cause lightheadedness that she described earlier this year, but as noted, no symptoms were reported at the time of this recording and the longest episode was only 16.7 secs.  Continue bisoprolol at current dose.

## 2020-04-22 NOTE — Telephone Encounter (Signed)
Tried to reach out to pt regarding recent results of her Zio monitor, no answer at this time, LMTCB.

## 2020-05-10 NOTE — Progress Notes (Signed)
Date:  05/11/2020   ID:  [name], [name] 1940/06/17, MRN [id number]  Patient Location:  [address] [place] [address]   Provider location:   [facility] [place] [facility]  PCP:  Lauren Crews, MD  Cardiologist:  Lauren Lloyd Lauren Lloyd  Chief Complaint  Patient presents with  . Follow-up    2 Months follow up and review monitor results. Medications verbally reviewed with patient.     History of Present Illness:    Lauren Lloyd is a 79 y.o. female  past medical history of Aortic atherosclerosis on CT scan 2020 hypertension,  asthma  Chronic lower extremity swelling Lloyd admission for atrial fibrillation December 2019 Associated acute diastolic CHF Status post successful DCCV on 06/15/2018.  NSR on 06/2018 stress test, 07/2018 Showing no significant ischemia Mild lower extremity swelling, chronic Who presents for Lloyd follow-up, follow-up of her atrial fibrillation and acute on chronic diastolic CHF  Last clinic visit 4/21 some shortness of breath on exertion amio down to 100 daily   Sedentary, no regular exercise program OFF CPAP, wakes her at 2 AM Followed by Lauren Lloyd, pulmonary.  Has not seen him recently  Takes lasix 40 mg daily Rarely take extra Lasix  Rare spells of dizziness, Going back 4-5 years Sitting position,  Does not want more pills  Severe back pain, has received cortisone injections Followed by Lauren Lloyd  EKG personally reviewed by myself on todays visit Shows NSR rate 60 bpm no ST or T wave changes  Other past medical history reviewed  Nov 2020, developed atrial fib Seen in clinic 11/16 Started on amiodarone, higher dose metoprolol Lasix increased,40 BID 3 days then back to 40 daily She continued on 40 BID  She presented to the clinic and had stopped her amiodarone  She was still in normal sinus rhythm but she had worsening shortness of breath  She reported very high fluid intake  REDS vest 40% on  04/16/2019, goal less than 36  stress test, 07/2018 Showing no significant ischemia normal ejection fraction normal perfusion  Presented to the Lloyd 04/2018 with worsening shortness of breath, leg swelling, weight gain, abdominal bloating and new atrial fibrillation with RVR Initially on Cardizem infusion, changed to oral Cardizem with titration upwards Discharged on Cardizem CD 300 mg and metoprolol 25 mg bid, Eliquis. Treated with IV Lasix during her Lloyd course with marked improvement in her shortness of breath  Carotid ultrasound January 2020 Less than 39% disease bilaterally   Past Medical History:  Diagnosis Date  . (HFpEF) heart failure with preserved ejection fraction ([place])    a. 05/2018 Echo: EF 55-60%, no rwma, mild to mod MR. Nl RV fxn. Mod TR. PASP 58mmHg.  . Arthritis    knees, Hands  . Arthritis of knee   . Back pain   . Carotid arterial disease ([place])    a. 03/2019 Carotid U/S: <50% bilat ICA stenoses.  . Cholelithiasis    a. 10/2018 noted on CT.  . Edema, lower extremity   . Fatty liver   . GERD (gastroesophageal reflux disease)   . History of stress test    a. 06/2018 MV: EF 59%, no ischemia/infarct. Low risk.  . Knee pain   . Lactose intolerance   . Mitral regurgitation    a. 05/2018 Echo: mild to mod MR.  . Multinodular goiter   . Obesity   . OSA (obstructive sleep apnea)   . PAF (paroxysmal atrial fibrillation) ([place])  a.  Diagnosed 12/19; b. 05/2018 s/p DCCV; c. 03/2019 & 05/2019 recurrent AFib-->managed w/ amio load; d. CHADS2VASc = 6 (CHF, HTN, age x 2, vascular disease, female)-->Eliquis & amio 100 qd.  [name] Kitchen PAH (pulmonary artery hypertension) ([place])   . Scoliosis   . SOB (shortness of breath)   . Swallowing difficulty    Past Surgical History:  Procedure Laterality Date  . CARDIOVERSION N/A 06/15/2018   Procedure: CARDIOVERSION (CATH LAB);  Surgeon: Lauren Merritts, MD;  Location: [facility];  Service: Cardiovascular;  Laterality: N/A;  . CATARACT  EXTRACTION W/PHACO Lloyd 01/25/2016   Procedure: CATARACT EXTRACTION PHACO AND INTRAOCULAR LENS PLACEMENT ([place]);  Surgeon: Lauren Freshwater, MD;  Location: [place];  Service: Ophthalmology;  Laterality: Lloyd;  Lloyd  . CATARACT EXTRACTION W/PHACO Left 02/22/2016   Procedure: CATARACT EXTRACTION PHACO AND INTRAOCULAR LENS PLACEMENT (IOC);  Surgeon: Lauren Freshwater, MD;  Location: [place];  Service: Ophthalmology;  Laterality: Left;  LEFT  . [place]  2010  . KNEE ARTHROSCOPY Lloyd 2004  . REPLACEMENT TOTAL KNEE Lloyd 2009   [facility]  . SKIN GRAFT Left 04/08/2013   Done on left index finger  . TONSILLECTOMY  1946     Current Meds  Medication Sig  . amiodarone (PACERONE) 100 MG tablet Take 1 tablet (100 mg total) by mouth daily.  . bisoprolol (ZEBETA) 5 MG tablet Take 0.5 tablet (2.5 mg) by mouth once daily  . celecoxib (CELEBREX) 200 MG capsule Take by mouth daily.  [name] Kitchen ELIQUIS 5 MG TABS tablet TAKE ONE TABLET BY MOUTH TWICE DAILY  . furosemide (LASIX) 40 MG tablet Take 1 tablet (40 mg total) by mouth 2 (two) times daily. Take twice a day anytime weight is 218 or higher. (Patient taking differently: Take 40 mg by mouth daily. Take twice a day anytime weight is 218 or higher.)  . gabapentin (NEURONTIN) 300 MG capsule TAKE ONE CAPSULE BY MOUTH AT BEDTIME  . loperamide (IMODIUM) 2 MG capsule Take 2 mg by mouth as needed.   . metaxalone (SKELAXIN) 800 MG tablet Take 800 mg by mouth 3 (three) times daily.   . Multiple Vitamin (MULTIVITAMIN) capsule Take 1 capsule by mouth daily.  . potassium chloride (KLOR-CON) 10 MEQ tablet TAKE 1 TABLET BY MOUH DAILY.  [name] Kitchen VITAMIN D PO Take by mouth.     Allergies:   Levofloxacin, Influenza vaccines, and Oysters [shellfish allergy]   Social History   Tobacco Use  . Smoking status: Former Smoker    Packs/day: 1.00    Years: 30.00    Pack years: 30.00    Types: Cigarettes    Quit date:  05/16/1989    Years since quitting: 31.0  . Smokeless tobacco: Never Used  Vaping Use  . Vaping Use: Never used  Substance Use Topics  . Alcohol use: Yes    Alcohol/week: 7.0 standard drinks    Types: 7 Glasses of wine per week    Comment: 0-2 a night  . Drug use: No     Current Outpatient Medications on File Prior to Visit  Medication Sig Dispense Refill  . amiodarone (PACERONE) 100 MG tablet Take 1 tablet (100 mg total) by mouth daily. 90 tablet 3  . bisoprolol (ZEBETA) 5 MG tablet Take 0.5 tablet (2.5 mg) by mouth once daily    . celecoxib (CELEBREX) 200 MG capsule Take by mouth daily.    [name] Kitchen ELIQUIS 5 MG TABS tablet TAKE ONE TABLET BY  MOUTH TWICE DAILY 180 tablet 1  . furosemide (LASIX) 40 MG tablet Take 1 tablet (40 mg total) by mouth 2 (two) times daily. Take twice a day anytime weight is 218 or higher. (Patient taking differently: Take 40 mg by mouth daily. Take twice a day anytime weight is 218 or higher.) 180 tablet 3  . gabapentin (NEURONTIN) 300 MG capsule TAKE ONE CAPSULE BY MOUTH AT BEDTIME 30 capsule 0  . loperamide (IMODIUM) 2 MG capsule Take 2 mg by mouth as needed.     . metaxalone (SKELAXIN) 800 MG tablet Take 800 mg by mouth 3 (three) times daily.     . Multiple Vitamin (MULTIVITAMIN) capsule Take 1 capsule by mouth daily.    . potassium chloride (KLOR-CON) 10 MEQ tablet TAKE 1 TABLET BY MOUH DAILY. 30 tablet 0  . VITAMIN D PO Take by mouth.     No current facility-administered medications on file prior to visit.     Family Hx: [name] family history includes Atrial fibrillation in her sister and sister; Breast cancer (age of onset: 12) in her sister; Healthy in her brother; Heart attack in her father; Heart disease in her mother; High blood pressure in her father; Hyperlipidemia in her sister and sister; Stroke in her father; Transient ischemic attack in her mother.  ROS:   Please see the history of present illness.    Review of Systems  Constitutional:  Negative.   HENT: Negative.   Respiratory: Positive for shortness of breath.   Cardiovascular: Negative.   Gastrointestinal: Negative.   Musculoskeletal: Positive for joint pain.  Neurological: Negative.   Psychiatric/Behavioral: Negative.   All other systems reviewed and are negative.    Labs/Other Tests and Data Reviewed:    Recent Labs: 12/09/2019: ALT 11; BUN 20; Creatinine, Ser 0.85; Potassium 4.7; Sodium 143; TSH 0.500   Recent Lipid Panel Lab Results  Component Value Date/Time   CHOL 260 (H) 12/09/2019 02:11 PM   TRIG 112 12/09/2019 02:11 PM   HDL 73 12/09/2019 02:11 PM   CHOLHDL 3.6 05/31/2018 04:52 AM   LDLCALC 168 (H) 12/09/2019 02:11 PM    Wt Readings from Last 3 Encounters:  05/11/20 208 lb (94.3 kg)  03/18/20 208 lb (94.3 kg)  02/24/20 205 lb (93 kg)     Exam:    BP (!) 144/74 (BP Location: Left Arm, Patient Position: Sitting, Cuff Size: Normal)   Pulse 60   Ht 5\' 6"  (1.676 m)   Wt 208 lb (94.3 kg)   SpO2 97%   BMI 33.57 kg/m  Constitutional:  oriented to person, place, and time. No distress.  Obese obese HENT:  Head: Grossly normal Eyes:  no discharge. No scleral icterus.  Neck: No JVD, no carotid bruits  Cardiovascular: Regular rate and rhythm, no murmurs appreciated Pulmonary/Chest: Clear to auscultation bilaterally, no wheezes or rails Abdominal: Soft.  no distension.  no tenderness.  Musculoskeletal: Normal range of motion Neurological:  normal muscle tone. Coordination normal. No atrophy Skin: Skin warm and dry Psychiatric: normal affect, pleasant   ASSESSMENT & PLAN:    Paroxysmal atrial fibrillation (HCC) -  Maintaining normal sinus rhythm   amiodarone down to 100 daily on last visit No changes made  Knee pain Has a brace, prior cortisone shots Would be cleared for surgery from cardiac perspective  Chronic diastolic CHF (congestive heart failure) (HCC) Reports she is compliant with Lasix 40 daily Rare extra lasix, Drinks a  lot Stable weight down   Leg edema  stable We will avoid calcium channel blockers Continue on the Lasix daily Extra Lasix as needed  Centrilobular emphysema (HCC)  Prior history of smoking Followed by pulmonary, Stable recently  Pulmonary hypertension, unspecified (HCC) Moderate pulmonary hypertension seen on prior echocardiogram Continue Lasix 40 daily Extra Lasix as needed for shortness of breath or leg edema  Back pain, knee pain Recommended weight loss Strict low carbohydrate diet   Total encounter time more than 25 minutes  Greater than 50% was spent in counseling and coordination of care with the patient    Signed, Ida Rogue, MD  05/11/2020 1:41 PM    [place] Office [address] [place] [address], [place], [place] [address]

## 2020-05-11 ENCOUNTER — Ambulatory Visit: Payer: Medicare HMO | Admitting: Cardiovascular Disease

## 2020-05-11 ENCOUNTER — Other Ambulatory Visit: Payer: Self-pay

## 2020-05-11 ENCOUNTER — Encounter: Payer: Self-pay | Admitting: Cardiovascular Disease

## 2020-05-11 VITALS — BP 144/74 | HR 60 | Ht 66.0 in | Wt 208.0 lb

## 2020-05-11 DIAGNOSIS — J432 Centrilobular emphysema: Secondary | ICD-10-CM | POA: Diagnosis not present

## 2020-05-11 DIAGNOSIS — I4819 Other persistent atrial fibrillation: Secondary | ICD-10-CM

## 2020-05-11 DIAGNOSIS — I272 Pulmonary hypertension, unspecified: Secondary | ICD-10-CM

## 2020-05-11 DIAGNOSIS — I10 Essential (primary) hypertension: Secondary | ICD-10-CM | POA: Diagnosis not present

## 2020-05-11 DIAGNOSIS — I5032 Chronic diastolic (congestive) heart failure: Secondary | ICD-10-CM | POA: Diagnosis not present

## 2020-05-11 DIAGNOSIS — I2584 Coronary atherosclerosis due to calcified coronary lesion: Secondary | ICD-10-CM | POA: Diagnosis not present

## 2020-05-11 DIAGNOSIS — I2510 Atherosclerotic heart disease of native coronary artery without angina pectoris: Secondary | ICD-10-CM | POA: Diagnosis not present

## 2020-05-11 MED ORDER — OMEPRAZOLE 20 MG PO CPDR: 20.0000 mg | DELAYED_RELEASE_CAPSULE | Freq: Every day | ORAL | 11 refills | Status: DC

## 2020-05-11 NOTE — Patient Instructions (Signed)
Medication Instructions:  We will add prevacid/omeprazole 20 mg daily, OTC for stomach  Take celebrex as needed for back (already on list)  If you need a refill on your cardiac medications before your next appointment, please call your pharmacy.    Lab work: No new labs needed   If you have labs (blood work) drawn today and your tests are completely normal, you will receive your results only by: [name] Kitchen MyChart Message (if you have MyChart) OR . A paper copy in the mail If you have any lab test that is abnormal or we need to change your treatment, we will call you to review the results.   Testing/Procedures: No new testing needed   Follow-Up: At [facility], you and your health needs are our priority.  As part of our continuing mission to provide you with exceptional heart care, we have created designated Provider Care Teams.  These Care Teams include your primary Cardiologist (physician) and Advanced Practice Providers (APPs -  Physician Assistants and Nurse Practitioners) who all work together to provide you with the care you need, when you need it.  . You will need a follow up appointment in 12 months  . Providers on your designated Care Team:   . Nicolasa Ducking, NP . Eula Listen, PA-C . Marisue Ivan, PA-C  Any Other Special Instructions Will Be Listed Below (If Applicable).  COVID-19 Vaccine Information can be found at: [email or web address] For questions related to vaccine distribution or appointments, please email [email or web address] or call [phone].

## 2020-05-18 DIAGNOSIS — M25562 Pain in left knee: Secondary | ICD-10-CM | POA: Diagnosis not present

## 2020-05-18 DIAGNOSIS — M25462 Effusion, left knee: Secondary | ICD-10-CM | POA: Diagnosis not present

## 2020-05-18 DIAGNOSIS — G8929 Other chronic pain: Secondary | ICD-10-CM | POA: Diagnosis not present

## 2020-05-18 DIAGNOSIS — Z96651 Presence of right artificial knee joint: Secondary | ICD-10-CM | POA: Diagnosis not present

## 2020-05-18 DIAGNOSIS — M1712 Unilateral primary osteoarthritis, left knee: Secondary | ICD-10-CM | POA: Diagnosis not present

## 2020-05-18 DIAGNOSIS — M25461 Effusion, right knee: Secondary | ICD-10-CM | POA: Diagnosis not present

## 2020-05-19 ENCOUNTER — Other Ambulatory Visit: Payer: Self-pay | Admitting: Cardiovascular Disease

## 2020-06-09 ENCOUNTER — Telehealth: Payer: Self-pay

## 2020-06-09 NOTE — Progress Notes (Signed)
Chronic Care Management Pharmacy Assistant   Name: Lauren Lloyd  MRN: [id number] DOB: [birth year]  Reason for Encounter:Hypertension  Disease State  Call. Patient Questions:  1.  Have you seen any other providers since your last visit? Yes, 04/13/2020 Physical Medicine Juanda Crumble , 05/11/2020 Cardiology Ida Rogue, 05/18/2020 Orthopedic Malon Kindle  2.  Any changes in your medicines or health? No   PCP : [name], MD  Allergies:   Allergies  Allergen Reactions  . Levofloxacin     Other reaction(s): Joint Pains Other reaction(s): Other (See Comments) Joint pain  . Influenza Vaccines Other (See Comments)    Bell's Palsy  . Oysters [Shellfish Allergy] Swelling    She states she had eaten them three days in a row and she developed swelling around her eyes.     Medications: Outpatient Encounter Medications as of 06/09/2020  Medication Sig  . amiodarone (PACERONE) 100 MG tablet Take 1 tablet (100 mg total) by mouth daily.  . bisoprolol (ZEBETA) 5 MG tablet Take 0.5 tablet (2.5 mg) by mouth once daily  . celecoxib (CELEBREX) 200 MG capsule Take by mouth daily.  [name] Kitchen ELIQUIS 5 MG TABS tablet TAKE ONE TABLET BY MOUTH TWICE DAILY  . furosemide (LASIX) 40 MG tablet Take 1 tablet (40 mg total) by mouth 2 (two) times daily. Take twice a day anytime weight is 218 or higher. (Patient taking differently: Take 40 mg by mouth daily. Take twice a day anytime weight is 218 or higher.)  . gabapentin (NEURONTIN) 300 MG capsule TAKE ONE CAPSULE BY MOUTH AT BEDTIME  . loperamide (IMODIUM) 2 MG capsule Take 2 mg by mouth as needed.   . metaxalone (SKELAXIN) 800 MG tablet Take 800 mg by mouth 3 (three) times daily.   . Multiple Vitamin (MULTIVITAMIN) capsule Take 1 capsule by mouth daily.  [name] Kitchen omeprazole (PRILOSEC) 20 MG capsule Take 1 capsule (20 mg total) by mouth daily.  . potassium chloride (KLOR-CON) 10 MEQ tablet TAKE 1 TABLET BY MOUH DAILY.  [name] Kitchen VITAMIN D PO Take by mouth.    No facility-administered encounter medications on file as of 06/09/2020.    Current Diagnosis: Patient Active Problem List   Diagnosis Date Noted  . Obesity (BMI 30-39.9) 11/08/2019  . Bradycardia 11/08/2019  . Dizziness 11/08/2019  . Dyspnea on exertion 08/28/2018  . Degenerative cervical spinal stenosis 07/26/2018  . Degenerative arthritis of left knee 07/26/2018  . Persistent atrial fibrillation ([place]) 06/06/2018  . Paresthesias 05/30/2018  . Chronic diastolic CHF (congestive heart failure) ([place]) 05/11/2018  . Centrilobular emphysema ([place]) 05/11/2018  . Hematuria 04/04/2017  . Leg cramps 04/04/2017  . Metatarsalgia of both feet 04/04/2017  . 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 Hypertension 01/03/2017  . Hemorrhoids 01/03/2017  . Healthcare maintenance 01/03/2017  . Chronic right shoulder pain 01/03/2017  . Pulmonary hypertension, unspecified ([place]) 04/02/2015  . Chest pain radiating to arm 02/17/2015  . Acid reflux 11/18/2014  . IBS (irritable bowel syndrome) 11/18/2014  . Primary osteoarthritis of one hip 10/03/2011  . L-S radiculopathy 09/29/2011  . Arthritis of knee, degenerative 09/29/2011  . S/P knee replacement 09/29/2011  . Non-toxic uninodular goiter 06/20/2009  . Cervical pain 08/20/2008  . OSA (obstructive sleep apnea) 12/12/2007  . Hypercholesteremia 07/30/2007    Goals Addressed   None    Reviewed chart prior to disease state call. Spoke with patient regarding BP  Recent Office Vitals: BP Readings from Last 3 Encounters:  05/11/20 (!) 144/74  03/18/20 (!) 142/60  02/24/20 125/70   Pulse Readings from Last 3 Encounters:  05/11/20 60  03/18/20 (!) 51  02/24/20 68    Wt Readings from Last 3 Encounters:  05/11/20 208 lb (94.3 kg)  03/18/20 208 lb (94.3 kg)  02/24/20 205 lb (93 kg)     Kidney Function Lab Results  Component Value Date/Time   CREATININE 0.85 12/09/2019 02:11 PM   CREATININE 1.02 (H) 07/15/2019 09:31 AM   CREATININE  0.78 01/19/2017 03:22 PM   GFRNONAA 65 12/09/2019 02:11 PM   GFRNONAA 73 01/19/2017 03:22 PM   GFRAA 75 12/09/2019 02:11 PM   GFRAA 85 01/19/2017 03:22 PM    BMP Latest Ref Rng & Units 12/09/2019 07/15/2019 04/15/2019  Glucose 65 - 99 mg/dL 94 96 100(H)  BUN 8 - 27 mg/dL 20 20 27(H)  Creatinine 0.57 - 1.00 mg/dL 0.85 1.02(H) 1.22(H)  BUN/Creat Ratio 12 - 28 24 20  -  Sodium 134 - 144 mmol/L 143 141 142  Potassium 3.5 - 5.2 mmol/L 4.7 4.4 3.9  Chloride 96 - 106 mmol/L 103 102 106  CO2 20 - 29 mmol/L 23 22 24   Calcium 8.7 - 10.3 mg/dL 9.3 9.4 8.8(L)    . Current antihypertensive regimen:  ? Bisprolol 2.5mg  twice daily  ? What recent interventions/DTPs have been made by any provider to improve Blood Pressure control since last CPP Visit: None ID . Any recent hospitalizations or ED visits since last visit with CPP? No   Adherence Review: Is the patient currently on ACE/ARB medication? Yes Does the patient have >5 day gap between last estimated fill dates? Yes  I have attempted without success to contact this patient by phone three times to do her hypertension Disease State call. I left a Voice message for patient to return my call.  Left voice message on 01/25,01/28,01/31.  Maryjean Ka   Follow-Up:  Pharmacist Review   [place] Clinical Pharmacist Assistant [phone]

## 2020-06-19 ENCOUNTER — Other Ambulatory Visit: Payer: Self-pay | Admitting: Cardiovascular Disease

## 2020-06-19 NOTE — Telephone Encounter (Signed)
Pt's age 80, wt 94.3 kg, SCr 0.85, CrCl 78.58, last ov w/ TG 05/11/20.

## 2020-06-19 NOTE — Telephone Encounter (Signed)
Please review for refill.  

## 2020-06-19 NOTE — Telephone Encounter (Signed)
Eliquis 5mg  refill request received. Patient is 80 years old, weight-94.3kg, Crea-0.85 on 12/09/2019, Diagnosis-Afib, and last seen by Dr. Rockey Situ on 05/11/2020. Dose is appropriate based on dosing criteria. Will send in refill to requested pharmacy.

## 2020-06-23 DIAGNOSIS — M47816 Spondylosis without myelopathy or radiculopathy, lumbar region: Secondary | ICD-10-CM | POA: Diagnosis not present

## 2020-07-03 ENCOUNTER — Other Ambulatory Visit: Payer: Self-pay | Admitting: Cardiovascular Disease

## 2020-07-07 ENCOUNTER — Telehealth: Payer: Medicare HMO

## 2020-07-07 DIAGNOSIS — M47816 Spondylosis without myelopathy or radiculopathy, lumbar region: Secondary | ICD-10-CM | POA: Diagnosis not present

## 2020-07-31 NOTE — Patient Instructions (Signed)

## 2020-07-31 NOTE — Progress Notes (Signed)
Established patient visit   Patient: Lauren Lloyd   DOB: [birth year]   80 y.o. Female  MRN: [id number] Visit Date: 08/03/2020  Today's healthcare provider: Lavon Paganini, MD   Chief Complaint  Patient presents with  . Hypertension  . Hyperlipidemia   Subjective    HPI  Hypertension, follow-up  BP Readings from Last 3 Encounters:  08/03/20 138/68  05/11/20 (!) 144/74  03/18/20 (!) 142/60   Wt Readings from Last 3 Encounters:  08/03/20 218 lb (98.9 kg)  05/11/20 208 lb (94.3 kg)  03/18/20 208 lb (94.3 kg)     She was last seen for hypertension 6 months ago.  BP at that visit was 116/58. Management since that visit includes no changes.  She reports excellent compliance with treatment. She is not having side effects.  She is following a Regular diet. She is not exercising. She does not smoke.  Use of agents associated with hypertension: none.   Outside blood pressures are not being checked. Symptoms: No chest pain No chest pressure  No palpitations No syncope  No dyspnea No orthopnea  No paroxysmal nocturnal dyspnea Yes lower extremity edema   Pertinent labs: Lab Results  Component Value Date   CHOL 260 (H) 12/09/2019   HDL 73 12/09/2019   LDLCALC 168 (H) 12/09/2019   TRIG 112 12/09/2019   CHOLHDL 3.6 05/31/2018   Lab Results  Component Value Date   NA 143 12/09/2019   K 4.7 12/09/2019   CREATININE 0.85 12/09/2019   GFRNONAA 65 12/09/2019   GFRAA 75 12/09/2019   GLUCOSE 94 12/09/2019     The ASCVD Risk score (Goff DC Jr., et al., 2013) failed to calculate for the following reasons:   The 2013 ASCVD risk score is only valid for ages 32 to 1   --------------------------------------------------------------------------------------------------- Lipid/Cholesterol, Follow-up  Last lipid panel Other pertinent labs  Lab Results  Component Value Date   CHOL 260 (H) 12/09/2019   HDL 73 12/09/2019   LDLCALC 168 (H) 12/09/2019   TRIG 112  12/09/2019   CHOLHDL 3.6 05/31/2018   Lab Results  Component Value Date   ALT 11 12/09/2019   AST 18 12/09/2019   PLT 274 04/01/2019   TSH 0.500 12/09/2019     She was last seen for this 6 months ago.  Management since that visit includes no changes.  She reports excellent compliance with treatment. She is not having side effects.   Symptoms: No chest pain No chest pressure/discomfort  No dyspnea Yes lower extremity edema  No numbness or tingling of extremity No orthopnea  No palpitations No paroxysmal nocturnal dyspnea  No speech difficulty No syncope   Current diet: in general, a "healthy" diet   Current exercise: none  The ASCVD Risk score ([place]., et al., 2013) failed to calculate for the following reasons:   The 2013 ASCVD risk score is only valid for ages 60 to 33  --------------------------------------------------------------------------------------------------- Chronic fatigue with insomnia - trouble falling asleep and staying asleep.  Sleeping a cumulative 6 hours a night. Has OSA on CPAP - mask wakes her in the middle of the night. Tried a different mask.      Patient Active Problem List   Diagnosis Date Noted  . Avitaminosis D 08/03/2020  . Morbid obesity ([place]) 08/03/2020  . Chronic fatigue 08/03/2020  . Insomnia 08/03/2020  . Bradycardia 11/08/2019  . Dizziness 11/08/2019  . Dyspnea on exertion 08/28/2018  . Degenerative cervical spinal stenosis 07/26/2018  .  Degenerative arthritis of left knee 07/26/2018  . Persistent atrial fibrillation ([place]) 06/06/2018  . Paresthesias 05/30/2018  . Chronic diastolic CHF (congestive heart failure) ([place]) 05/11/2018  . Centrilobular emphysema ([place]) 05/11/2018  . Hematuria 04/04/2017  . Leg cramps 04/04/2017  . Metatarsalgia of both feet 04/04/2017  . 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 Essential hypertension 01/03/2017  . Hemorrhoids 01/03/2017  . Healthcare maintenance 01/03/2017  . Chronic right  shoulder pain 01/03/2017  . Pulmonary hypertension, unspecified ([place]) 04/02/2015  . Acid reflux 11/18/2014  . IBS (irritable bowel syndrome) 11/18/2014  . Primary osteoarthritis of one hip 10/03/2011  . L-S radiculopathy 09/29/2011  . Arthritis of knee, degenerative 09/29/2011  . S/P knee replacement 09/29/2011  . Non-toxic uninodular goiter 06/20/2009  . Cervical pain 08/20/2008  . OSA (obstructive sleep apnea) 12/12/2007  . Hypercholesteremia 07/30/2007   Social History   Tobacco Use  . Smoking status: Former Smoker    Packs/day: 1.00    Years: 30.00    Pack years: 30.00    Types: Cigarettes    Quit date: 05/16/1989    Years since quitting: 31.2  . Smokeless tobacco: Never Used  Vaping Use  . Vaping Use: Never used  Substance Use Topics  . Alcohol use: Yes    Alcohol/week: 7.0 standard drinks    Types: 7 Glasses of wine per week    Comment: 0-2 a night  . Drug use: No   Allergies  Allergen Reactions  . Levofloxacin     Other reaction(s): Joint Pains Other reaction(s): Other (See Comments) Joint pain  . Influenza Vaccines Other (See Comments)    Bell's Palsy  . Oysters [Shellfish Allergy] Swelling    She states she had eaten them three days in a row and she developed swelling around her eyes.        Medications: Outpatient Medications Prior to Visit  Medication Sig  . amiodarone (PACERONE) 100 MG tablet Take 1 tablet (100 mg total) by mouth daily.  . bisoprolol (ZEBETA) 5 MG tablet Take 0.5 tablet (2.5 mg) by mouth once daily  . celecoxib (CELEBREX) 200 MG capsule Take by mouth daily.  [name] Kitchen ELIQUIS 5 MG TABS tablet TAKE ONE TABLET BY MOUTH TWICE DAILY  . furosemide (LASIX) 40 MG tablet Take 1 tablet (40 mg total) by mouth 2 (two) times daily. Take twice a day anytime weight is 218 or higher.  . loperamide (IMODIUM) 2 MG capsule Take 2 mg by mouth as needed.   . metaxalone (SKELAXIN) 800 MG tablet Take 800 mg by mouth 3 (three) times daily.   . Multiple Vitamin  (MULTIVITAMIN) capsule Take 1 capsule by mouth daily.  [name] Kitchen omeprazole (PRILOSEC) 20 MG capsule Take 1 capsule (20 mg total) by mouth daily.  . potassium chloride (KLOR-CON) 10 MEQ tablet TAKE 1 TABLET BY MOUH DAILY.  [name] Kitchen VITAMIN D PO Take by mouth.  . gabapentin (NEURONTIN) 300 MG capsule TAKE ONE CAPSULE BY MOUTH AT BEDTIME (Patient not taking: Reported on 08/03/2020)   No facility-administered medications prior to visit.    Review of Systems  Constitutional: Negative for appetite change, chills and fatigue.  Respiratory: Positive for shortness of breath. Negative for chest tightness.   Cardiovascular: Positive for leg swelling. Negative for chest pain and palpitations.  Musculoskeletal: Positive for back pain, gait problem and myalgias.    Last CBC Lab Results  Component Value Date   WBC 8.8 04/01/2019   HGB 13.0 04/01/2019   HCT 38.4 04/01/2019   MCV  90.8 04/01/2019   MCH 30.7 04/01/2019   RDW 12.7 04/01/2019   PLT 274 04/01/2019   Last thyroid functions Lab Results  Component Value Date   TSH 0.500 12/09/2019   T3TOTAL 80 12/09/2019   Last vitamin D Lab Results  Component Value Date   VD25OH 24.4 (L) 12/09/2019   Last vitamin B12 and Folate Lab Results  Component Value Date   VITAMINB12 382 07/15/2019        Objective    BP 138/68 (BP Location: Left Arm, Patient Position: Sitting, Cuff Size: Large)   Pulse (!) 48   Temp 98.4 F (36.9 C) (Oral)   Resp 16   Wt 218 lb (98.9 kg)   SpO2 96%   BMI 35.19 kg/m  BP Readings from Last 3 Encounters:  08/03/20 138/68  05/11/20 (!) 144/74  03/18/20 (!) 142/60   Wt Readings from Last 3 Encounters:  08/03/20 218 lb (98.9 kg)  05/11/20 208 lb (94.3 kg)  03/18/20 208 lb (94.3 kg)      Physical Exam Vitals reviewed.  Constitutional:      General: She is not in acute distress.    Appearance: Normal appearance. She is well-developed. She is not diaphoretic.  HENT:     Head: Normocephalic and atraumatic.  Eyes:      General: No scleral icterus.    Conjunctiva/sclera: Conjunctivae normal.  Neck:     Thyroid: No thyromegaly.  Cardiovascular:     Rate and Rhythm: Normal rate and regular rhythm.     Pulses: Normal pulses.     Heart sounds: Normal heart sounds. No murmur heard.   Pulmonary:     Effort: Pulmonary effort is normal. No respiratory distress.     Breath sounds: Normal breath sounds. No stridor. No wheezing, rhonchi or rales.  Musculoskeletal:     Cervical back: Neck supple.     Right lower leg: No edema.     Left lower leg: No edema.  Lymphadenopathy:     Cervical: No cervical adenopathy.  Skin:    General: Skin is warm and dry.     Findings: No rash.  Neurological:     Mental Status: She is alert and oriented to person, place, and time. Mental status is at baseline.  Psychiatric:        Mood and Affect: Mood normal.        Behavior: Behavior normal.       No results found for any visits on 08/03/20.  Assessment & Plan     Problem List Items Addressed This Visit      Cardiovascular and Mediastinum   Essential hypertension - Primary    Well controlled Continue current medications Recheck metabolic panel F/u in 6 months      Relevant Orders   Comprehensive metabolic panel   Persistent atrial fibrillation (HCC)    Followed by cardiology Rate and rhythm controlled Continue amiodarone, BB, Eliquis Check CBC        Other   Hypercholesteremia    Previously well controlled Not on a statin Repeat FLP and CMP Goal LDL < 100      Relevant Orders   Lipid panel   Comprehensive metabolic panel   Avitaminosis D    Low on last check Continue supplement Recheck lab      Relevant Orders   VITAMIN D 25 Hydroxy (Vit-D Deficiency, Fractures)   Morbid obesity (HCC)    BMI >35 and assoc with HTN, GERD, HLD Discussed importance of healthy weight  management Discussed diet and exercise       Chronic fatigue    Long-standing and multi-factorial Recheck Vit D Start  melatonin for insomnia Consider that untreated OSA may contribute      Relevant Orders   TSH   CBC w/Diff/Platelet   Insomnia    New problem Trouble falling asleep and staying asleep Uncontrolled OSA, is not able to tolerate CPAP, is likely contributing This likely contributes to her chronic fatigue Trial of melatonin Consider trazodone       Other Visit Diagnoses    Long term (current) use of anticoagulants       Relevant Orders   CBC w/Diff/Platelet   Screening mammogram for breast cancer       Relevant Orders   MM 3D SCREEN BREAST BILATERAL       Return in about 6 months (around 02/03/2021) for CPE, AWV.      I, Lavon Paganini, MD, have reviewed all documentation for this visit. The documentation on 08/03/20 for the exam, diagnosis, procedures, and orders are all accurate and complete.   Bacigalupo, Dionne Bucy, MD, MPH [facility]

## 2020-08-03 ENCOUNTER — Ambulatory Visit
Admission: RE | Admit: 2020-08-03 | Discharge: 2020-08-03 | Disposition: A | Discharge status: Home / Self Care | Payer: Medicare HMO | Source: Ambulatory Visit | Attending: Family Medicine | Admitting: Family Medicine

## 2020-08-03 ENCOUNTER — Ambulatory Visit (INDEPENDENT_AMBULATORY_CARE_PROVIDER_SITE_OTHER): Payer: Medicare HMO | Admitting: Family Medicine

## 2020-08-03 ENCOUNTER — Other Ambulatory Visit: Payer: Self-pay

## 2020-08-03 ENCOUNTER — Encounter: Payer: Self-pay | Admitting: Family Medicine

## 2020-08-03 VITALS — BP 138/68 | HR 48 | Temp 98.4°F | Resp 16 | Wt 218.0 lb

## 2020-08-03 DIAGNOSIS — G4700 Insomnia, unspecified: Secondary | ICD-10-CM | POA: Diagnosis not present

## 2020-08-03 DIAGNOSIS — E559 Vitamin D deficiency, unspecified: Secondary | ICD-10-CM

## 2020-08-03 DIAGNOSIS — R5382 Chronic fatigue, unspecified: Secondary | ICD-10-CM

## 2020-08-03 DIAGNOSIS — Z1231 Encounter for screening mammogram for malignant neoplasm of breast: Secondary | ICD-10-CM

## 2020-08-03 DIAGNOSIS — E7800 Pure hypercholesterolemia, unspecified: Secondary | ICD-10-CM

## 2020-08-03 DIAGNOSIS — M5136 Other intervertebral disc degeneration, lumbar region: Secondary | ICD-10-CM | POA: Diagnosis not present

## 2020-08-03 DIAGNOSIS — Z7901 Long term (current) use of anticoagulants: Secondary | ICD-10-CM | POA: Diagnosis not present

## 2020-08-03 DIAGNOSIS — I4819 Other persistent atrial fibrillation: Secondary | ICD-10-CM

## 2020-08-03 DIAGNOSIS — I10 Essential (primary) hypertension: Secondary | ICD-10-CM

## 2020-08-03 DIAGNOSIS — M47816 Spondylosis without myelopathy or radiculopathy, lumbar region: Secondary | ICD-10-CM | POA: Diagnosis not present

## 2020-08-03 DIAGNOSIS — M48062 Spinal stenosis, lumbar region with neurogenic claudication: Secondary | ICD-10-CM | POA: Diagnosis not present

## 2020-08-03 DIAGNOSIS — E669 Obesity, unspecified: Secondary | ICD-10-CM

## 2020-08-03 NOTE — Assessment & Plan Note (Signed)
Long-standing and multi-factorial Recheck Vit D Start melatonin for insomnia Consider that untreated OSA may contribute

## 2020-08-03 NOTE — Assessment & Plan Note (Signed)
Low on last check Continue supplement Recheck lab

## 2020-08-03 NOTE — Assessment & Plan Note (Signed)
Followed by cardiology Rate and rhythm controlled Continue amiodarone, BB, Eliquis Check CBC

## 2020-08-03 NOTE — Assessment & Plan Note (Addendum)
Previously well controlled Not on a statin Repeat FLP and CMP Goal LDL < 100

## 2020-08-03 NOTE — Assessment & Plan Note (Signed)
BMI >35 and assoc with HTN, GERD, HLD Discussed importance of healthy weight management Discussed diet and exercise

## 2020-08-03 NOTE — Assessment & Plan Note (Signed)
New problem Trouble falling asleep and staying asleep Uncontrolled OSA, is not able to tolerate CPAP, is likely contributing This likely contributes to her chronic fatigue Trial of melatonin Consider trazodone

## 2020-08-03 NOTE — Assessment & Plan Note (Signed)
Well controlled Continue current medications Recheck metabolic panel F/u in 6 months  

## 2020-08-04 ENCOUNTER — Telehealth: Payer: Self-pay

## 2020-08-04 LAB — COMPREHENSIVE METABOLIC PANEL
ALT: 12 IU/L (ref 0–32)
AST: 21 IU/L (ref 0–40)
Albumin/Globulin Ratio: 1.7 (ref 1.2–2.2)
Albumin: 4.3 g/dL (ref 3.7–4.7)
Alkaline Phosphatase: 108 IU/L (ref 44–121)
BUN/Creatinine Ratio: 20 (ref 12–28)
BUN: 20 mg/dL (ref 8–27)
Bilirubin Total: 0.4 mg/dL (ref 0.0–1.2)
CO2: 26 mmol/L (ref 20–29)
Calcium: 9.5 mg/dL (ref 8.7–10.3)
Chloride: 103 mmol/L (ref 96–106)
Creatinine, Ser: 0.99 mg/dL (ref 0.57–1.00)
Globulin, Total: 2.5 g/dL (ref 1.5–4.5)
Glucose: 98 mg/dL (ref 65–99)
Potassium: 4.6 mmol/L (ref 3.5–5.2)
Sodium: 144 mmol/L (ref 134–144)
Total Protein: 6.8 g/dL (ref 6.0–8.5)
eGFR: 58 mL/min/{1.73_m2} — ABNORMAL LOW (ref >59)

## 2020-08-04 LAB — CBC WITH DIFFERENTIAL/PLATELET
Basophils Absolute: 0.1 10*3/uL (ref 0.0–0.2)
Basos: 1 %
EOS (ABSOLUTE): 0.2 10*3/uL (ref 0.0–0.4)
Eos: 3 %
Hematocrit: 40.4 % (ref 34.0–46.6)
Hemoglobin: 13.6 g/dL (ref 11.1–15.9)
Immature Grans (Abs): 0 10*3/uL (ref 0.0–0.1)
Immature Granulocytes: 0 %
Lymphocytes Absolute: 2.3 10*3/uL (ref 0.7–3.1)
Lymphs: 33 %
MCH: 31 pg (ref 26.6–33.0)
MCHC: 33.7 g/dL (ref 31.5–35.7)
MCV: 92 fL (ref 79–97)
Monocytes Absolute: 0.8 10*3/uL (ref 0.1–0.9)
Monocytes: 11 %
Neutrophils Absolute: 3.5 10*3/uL (ref 1.4–7.0)
Neutrophils: 52 %
Platelets: 243 10*3/uL (ref 150–450)
RBC: 4.39 x10E6/uL (ref 3.77–5.28)
RDW: 12.1 % (ref 11.7–15.4)
WBC: 6.8 10*3/uL (ref 3.4–10.8)

## 2020-08-04 LAB — VITAMIN D 25 HYDROXY (VIT D DEFICIENCY, FRACTURES): Vit D, 25-Hydroxy: 27.3 ng/mL — ABNORMAL LOW (ref 30.0–100.0)

## 2020-08-04 LAB — LIPID PANEL
Chol/HDL Ratio: 4.4 ratio (ref 0.0–4.4)
Cholesterol, Total: 289 mg/dL — ABNORMAL HIGH (ref 100–199)
HDL: 65 mg/dL (ref >39)
LDL Chol Calc (NIH): 188 mg/dL — ABNORMAL HIGH (ref 0–99)
Triglycerides: 193 mg/dL — ABNORMAL HIGH (ref 0–149)
VLDL Cholesterol Cal: 36 mg/dL (ref 5–40)

## 2020-08-04 LAB — TSH: TSH: 1.02 u[IU]/mL (ref 0.450–4.500)

## 2020-08-04 NOTE — Telephone Encounter (Signed)
Pt advised.  She declines starting a statin at this time.   Thanks,   -Mickel Baas

## 2020-08-04 NOTE — Telephone Encounter (Signed)
-----   Message from [name], MD sent at 08/04/2020 12:49 PM EDT ----- Normal labs, except low Vit D - start supplement if not yet taking - and high cholesterol.  I know that she is not currently on statin, but consider

## 2020-09-17 ENCOUNTER — Ambulatory Visit: Payer: Self-pay

## 2020-09-17 ENCOUNTER — Telehealth: Payer: Self-pay | Admitting: *Deleted

## 2020-09-17 DIAGNOSIS — Z03818 Encounter for observation for suspected exposure to other biological agents ruled out: Secondary | ICD-10-CM | POA: Diagnosis not present

## 2020-09-17 DIAGNOSIS — Z20822 Contact with and (suspected) exposure to covid-19: Secondary | ICD-10-CM | POA: Diagnosis not present

## 2020-09-17 DIAGNOSIS — U071 COVID-19: Secondary | ICD-10-CM

## 2020-09-17 NOTE — Telephone Encounter (Signed)
Pt is calling and has been having for last 2 days cough, headache and ear discomfort. Pt would like advise. Pt has a trip plan for next week no openings at [facility].  Pt. Reports she started feeling bad Monday. Has dry cough, fatigue,headache ear fullness. No availability for virtual visit. Arbie Cookey in the practice states they recommend COVID testing at [facility]. Pt. States she will get tested there.Instructed to go to UC for worsening of symptoms.  Answer Assessment - Initial Assessment Questions 1. ONSET: "When did the cough begin?"      Monday 2. SEVERITY: "How bad is the cough today?"      Mild-moderate 3. SPUTUM: "Describe the color of your sputum" (none, dry cough; clear, white, yellow, green)     No 4. HEMOPTYSIS: "Are you coughing up any blood?" If so ask: "How much?" (flecks, streaks, tablespoons, etc.)     No 5. DIFFICULTY BREATHING: "Are you having difficulty breathing?" If Yes, ask: "How bad is it?" (e.g., mild, moderate, severe)    - MILD: No SOB at rest, mild SOB with walking, speaks normally in sentences, can lay down, no retractions, pulse < 100.    - MODERATE: SOB at rest, SOB with minimal exertion and prefers to sit, cannot lie down flat, speaks in phrases, mild retractions, audible wheezing, pulse 100-120.    - SEVERE: Very SOB at rest, speaks in single words, struggling to breathe, sitting hunched forward, retractions, pulse > 120      No 6. FEVER: "Do you have a fever?" If Yes, ask: "What is your temperature, how was it measured, and when did it start?"     100-101 7. CARDIAC HISTORY: "Do you have any history of heart disease?" (e.g., heart attack, congestive heart failure)      Heart - a-fib 8. LUNG HISTORY: "Do you have any history of lung disease?"  (e.g., pulmonary embolus, asthma, emphysema)     No 9. PE RISK FACTORS: "Do you have a history of blood clots?" (or: recent major surgery, recent prolonged travel, bedridden)     No 10. OTHER SYMPTOMS: "Do you have any  other symptoms?" (e.g., runny nose, wheezing, chest pain)       Ear pain,fatigue, headache 11. PREGNANCY: "Is there any chance you are pregnant?" "When was your last menstrual period?"       No 12. TRAVEL: "Have you traveled out of the country in the last month?" (e.g., travel history, exposures)       No  Protocols used: COUGH - ACUTE NON-PRODUCTIVE-A-AH

## 2020-09-17 NOTE — Telephone Encounter (Signed)
Noted  

## 2020-09-17 NOTE — Telephone Encounter (Signed)
Patient tested for Covid this morning-no results yet. Symptoms began 2 days ago on Tuesday  Cough, body aches, fever last night, HA. She has upcoming trip planned for 09/21/20 and would like to be referred to the [facility] today so that if her test is positive she may qualify to start treatment tomorrow. Routing to pcp for referral. Provided routine Covid advice to the patient.

## 2020-09-17 NOTE — Telephone Encounter (Signed)
FYI

## 2020-09-18 ENCOUNTER — Ambulatory Visit: Payer: Self-pay | Admitting: *Deleted

## 2020-09-18 NOTE — Addendum Note (Signed)
Addended by: [name] on: 09/18/2020 07:57 AM   Modules accepted: Orders

## 2020-09-18 NOTE — Telephone Encounter (Signed)
Patient is calling to verify referral put in- infusion- it is in chart and infusion clinic nurse has been notified to be on look out.  Patient is requesting medication from provider- prednisone and cough medication(syrup). Advised I would send request to office- patient uses [facility]. ( Child protocol- accidentally chosen) Reason for Disposition . [1] HIGH RISK for severe COVID complications (e.g., weak immune system, age > 49 years, obesity with BMI > 25, pregnant, chronic lung disease or other chronic medical condition) AND [2] COVID symptoms (e.g., cough, fever)  (Exceptions: Already seen by PCP and no new or worsening symptoms.)  Answer Assessment - Initial Assessment Questions 1. COVID-19 DIAGNOSIS: "Who made your COVID-19 diagnosis? Was it confirmed by a positive lab test?"      Diagnosed yesterday 2. COVID-19 EXPOSURE: "Was there any known exposure to COVID-19 before the symptoms began?" Household exposure or close contact with positive COVID-19 patient outside the home (child care, school, work, play or sports).  CDC Definition of close contact: within 6 feet (2 meters) for a total of 15 minutes or more over a 24-hour period.     Patient did go to flower show 3. ONSET: "When did the COVID-19 symptoms start?"      Symptoms started Sunday/Monday 4. WORST SYMPTOM: "What is your child's worst symptom?"      cough 5. COUGH: "Does your child have a cough?" If so, ask, "How bad is the cough?"       Yes- productive- constant/spells 6. RESPIRATORY DISTRESS: "Describe your child's breathing. What does it sound like?" (e.g., wheezing, stridor, grunting, weak cry, unable to speak, retractions, rapid rate, cyanosis)     Ok- SOB is mild 7. BETTER-SAME-WORSE: "Is your child getting better, staying the same or getting worse compared to yesterday?"  If getting worse, ask, "In what way?"     same 8. FEVER: "Does your child have a fever?" If so, ask: "What is it, how was it measured, and how  long has it been present?"      no Ear pressure, fatigue  Protocols used: CORONAVIRUS (COVID-19) DIAGNOSED OR SUSPECTED-A-AH, CORONAVIRUS (COVID-19) DIAGNOSED OR SUSPECTED-P-AH

## 2020-09-18 NOTE — Telephone Encounter (Signed)
Pt called stating that her covid test came back positive. Pt was transferred to covid infusion line. She states that her cough was not very bad yesterday, but last night it was non stop and she had trouble sleeping. Pt is requesting to have a prescription for prednisone. Please advise.      [facility] - [place], [place] - [address]  [place] [address]  Phone: [phone] Fax: [phone]  Hours: Not open 24 hours

## 2020-09-18 NOTE — Telephone Encounter (Signed)
Patient was advised and states she has tried all of the medications and none is helping her. She said the Tylenol is only helping with her headaches. Please advised.

## 2020-09-18 NOTE — Telephone Encounter (Signed)
Recommend Mucinex, Delsym, humidifier, Tylenol as needed, honey for cough.  Infusion will also help her symptoms.

## 2020-09-18 NOTE — Telephone Encounter (Signed)
Referral placed, but they will require test results before treatment.

## 2020-09-19 ENCOUNTER — Telehealth: Payer: Self-pay | Admitting: Adult Health

## 2020-09-19 NOTE — Telephone Encounter (Signed)
I called Lauren Lloyd to discuss her COVID 19 diagnosis.  She notes that her symptoms began early last week on 5/2 maybe earlier.  She underwent testing on Wednesday 09/17/2020.  A referral was placed for MAB treatment on 09/18/2020.  I reviewed with Lauren Lloyd that unfortunately she is outside of the window for oral therapy (that was yesterday), or IV therapy (her last day of eligibility will be on 5/8 and unfortunately IV infusions are not available on Saturday and Sunday).    Lauren Lloyd voiced her disappointment with this process, and noted she didn't have any supportive medications for her COVID 19 diagnosis.  I discussed some over the counter options such as robitussin, coricidin BP, and mucinex.  She noted the only medication that works for her when her cough is like this is Tussionex.  I offered to prescribe this for her, however she noted that her drug store had closed and she declined the prescription.  I offered to give her the phone number to the office of patient experience, and she declined.    I offered to give her the number for the post covid-clinic in [place], and she declined, and noted she was traveling to [place] on Monday.  I reviewed other opportunities for care over the weekend including E-visits and urgent care.  I wished Lauren Lloyd well and the end of our call, she thanked me for my time.  Wilber Bihari, NP

## 2020-09-21 ENCOUNTER — Other Ambulatory Visit: Payer: Self-pay | Admitting: Cardiovascular Disease

## 2020-09-21 NOTE — Telephone Encounter (Signed)
See message from [place] [facility]

## 2020-09-24 ENCOUNTER — Encounter: Payer: Self-pay | Admitting: Emergency Medicine

## 2020-09-24 ENCOUNTER — Emergency Department: Payer: Medicare HMO

## 2020-09-24 ENCOUNTER — Other Ambulatory Visit: Payer: Self-pay

## 2020-09-24 ENCOUNTER — Ambulatory Visit: Payer: Self-pay | Admitting: *Deleted

## 2020-09-24 ENCOUNTER — Emergency Department
Admission: EM | Admit: 2020-09-24 | Discharge: 2020-09-24 | Disposition: A | Discharge status: Home / Self Care | Payer: Medicare HMO | Attending: Emergency Medicine | Admitting: Emergency Medicine

## 2020-09-24 DIAGNOSIS — R202 Paresthesia of skin: Secondary | ICD-10-CM | POA: Insufficient documentation

## 2020-09-24 DIAGNOSIS — Z96651 Presence of right artificial knee joint: Secondary | ICD-10-CM | POA: Diagnosis not present

## 2020-09-24 DIAGNOSIS — Z87891 Personal history of nicotine dependence: Secondary | ICD-10-CM | POA: Insufficient documentation

## 2020-09-24 DIAGNOSIS — Z79899 Other long term (current) drug therapy: Secondary | ICD-10-CM | POA: Insufficient documentation

## 2020-09-24 DIAGNOSIS — I5032 Chronic diastolic (congestive) heart failure: Secondary | ICD-10-CM | POA: Diagnosis not present

## 2020-09-24 DIAGNOSIS — Z7901 Long term (current) use of anticoagulants: Secondary | ICD-10-CM | POA: Diagnosis not present

## 2020-09-24 DIAGNOSIS — R200 Anesthesia of skin: Secondary | ICD-10-CM | POA: Diagnosis not present

## 2020-09-24 DIAGNOSIS — I110 Hypertensive heart disease with heart failure: Secondary | ICD-10-CM | POA: Insufficient documentation

## 2020-09-24 DIAGNOSIS — M11231 Other chondrocalcinosis, right wrist: Secondary | ICD-10-CM

## 2020-09-24 DIAGNOSIS — R001 Bradycardia, unspecified: Secondary | ICD-10-CM | POA: Diagnosis not present

## 2020-09-24 DIAGNOSIS — M79601 Pain in right arm: Secondary | ICD-10-CM | POA: Insufficient documentation

## 2020-09-24 DIAGNOSIS — M7989 Other specified soft tissue disorders: Secondary | ICD-10-CM | POA: Insufficient documentation

## 2020-09-24 DIAGNOSIS — M25531 Pain in right wrist: Secondary | ICD-10-CM | POA: Diagnosis not present

## 2020-09-24 LAB — COMPREHENSIVE METABOLIC PANEL
ALT: 14 U/L (ref 0–44)
AST: 18 U/L (ref 15–41)
Albumin: 3.6 g/dL (ref 3.5–5.0)
Alkaline Phosphatase: 73 U/L (ref 38–126)
Anion gap: 13 (ref 5–15)
BUN: 21 mg/dL (ref 8–23)
CO2: 21 mmol/L — ABNORMAL LOW (ref 22–32)
Calcium: 9 mg/dL (ref 8.9–10.3)
Chloride: 102 mmol/L (ref 98–111)
Creatinine, Ser: 0.89 mg/dL (ref 0.44–1.00)
GFR, Estimated: >60 mL/min (ref >60)
Glucose, Bld: 127 mg/dL — ABNORMAL HIGH (ref 70–99)
Potassium: 3.9 mmol/L (ref 3.5–5.1)
Sodium: 136 mmol/L (ref 135–145)
Total Bilirubin: 0.9 mg/dL (ref 0.3–1.2)
Total Protein: 6.9 g/dL (ref 6.5–8.1)

## 2020-09-24 LAB — DIFFERENTIAL
Abs Immature Granulocytes: 0.08 10*3/uL — ABNORMAL HIGH (ref 0.00–0.07)
Basophils Absolute: 0.1 10*3/uL (ref 0.0–0.1)
Basophils Relative: 0 %
Eosinophils Absolute: 0.1 10*3/uL (ref 0.0–0.5)
Eosinophils Relative: 0 %
Immature Granulocytes: 1 %
Lymphocytes Relative: 13 %
Lymphs Abs: 1.9 10*3/uL (ref 0.7–4.0)
Monocytes Absolute: 1.4 10*3/uL — ABNORMAL HIGH (ref 0.1–1.0)
Monocytes Relative: 9 %
Neutro Abs: 11 10*3/uL — ABNORMAL HIGH (ref 1.7–7.7)
Neutrophils Relative %: 77 %

## 2020-09-24 LAB — APTT: aPTT: 32 seconds (ref 24–36)

## 2020-09-24 LAB — CBC
HCT: 37.4 % (ref 36.0–46.0)
Hemoglobin: 12.8 g/dL (ref 12.0–15.0)
MCH: 31.3 pg (ref 26.0–34.0)
MCHC: 34.2 g/dL (ref 30.0–36.0)
MCV: 91.4 fL (ref 80.0–100.0)
Platelets: 278 10*3/uL (ref 150–400)
RBC: 4.09 MIL/uL (ref 3.87–5.11)
RDW: 11.9 % (ref 11.5–15.5)
WBC: 14.4 10*3/uL — ABNORMAL HIGH (ref 4.0–10.5)
nRBC: 0 % (ref 0.0–0.2)

## 2020-09-24 LAB — PROTIME-INR
INR: 1.1 (ref 0.8–1.2)
Prothrombin Time: 14.1 seconds (ref 11.4–15.2)

## 2020-09-24 LAB — URIC ACID: Uric Acid, Serum: 7.2 mg/dL — ABNORMAL HIGH (ref 2.5–7.1)

## 2020-09-24 MED ORDER — PREDNISONE 10 MG PO TABS: ORAL_TABLET | ORAL | 0 refills | Status: AC

## 2020-09-24 MED ORDER — NAPROXEN 500 MG PO TABS
500.0000 mg | ORAL_TABLET | Freq: Once | ORAL | Status: AC
  Administered 2020-09-24: 500 mg via ORAL
  Filled 2020-09-24: qty 1

## 2020-09-24 MED ORDER — TRAMADOL HCL 50 MG PO TABS: 50.0000 mg | ORAL_TABLET | Freq: Four times a day (QID) | ORAL | 0 refills | Status: DC | PRN

## 2020-09-24 MED ORDER — FAMOTIDINE 20 MG PO TABS
40.0000 mg | ORAL_TABLET | Freq: Once | ORAL | Status: AC
  Administered 2020-09-24: 40 mg via ORAL
  Filled 2020-09-24: qty 2

## 2020-09-24 NOTE — Telephone Encounter (Signed)
  Reason for Disposition . [1] Numbness (i.e., loss of sensation) of the face, arm / hand, or leg / foot on one side of the body AND [2] sudden onset AND [3] present now    Right arm is numb and she can't use it.  Answer Assessment - Initial Assessment Questions 1. SYMPTOM: "What is the main symptom you are concerned about?" (e.g., weakness, numbness)     Tuesday night she started having numbness in her right arm.   She woke up Wed. Morning with pain in her wrist that has spread up to the shoulder.   Her whole right arm is numb this morning.   She can't use it.   Her right hand is swollen.   She thought yesterday it might get better but the pain got worse. She tested positive for covid a week ago Wed.   She called the covid care line wondering if the arm numbness was related to having covid.   They told her it was not and to call her doctor. She is on blood thinners for A. Fib. 2. ONSET: "When did this start?" (minutes, hours, days; while sleeping)     Tuesday night 3. LAST NORMAL: "When was the last time you (the patient) were normal (no symptoms)?"     Tuesday night 4. PATTERN "Does this come and go, or has it been constant since it started?"  "Is it present now?"     Constant.   The pain went from her wrist up to her shoulder yesterday (Wed). 5. CARDIAC SYMPTOMS: "Have you had any of the following symptoms: chest pain, difficulty breathing, palpitations?"     Has A. Fib and on blood thinners.   No c/o chest pain 6. NEUROLOGIC SYMPTOMS: "Have you had any of the following symptoms: headache, dizziness, vision loss, double vision, changes in speech, unsteady on your feet?"     No just the above listed symptoms in her right arm. 7. OTHER SYMPTOMS: "Do you have any other symptoms?"     She is positive for covid a week ago Wed. (Yesterday) 8. PREGNANCY: "Is there any chance you are pregnant?" "When was your last menstrual period?"     N/A due to age  Protocols used: NEUROLOGIC DEFICIT-A-AH

## 2020-09-24 NOTE — ED Notes (Signed)
Provided DC instructions. Verbalized understanding.  

## 2020-09-24 NOTE — ED Triage Notes (Signed)
Pt comes into the ED via POV c/o right arm numbness that started the day before yesterday.  Pt states she was diagnosed with COVID a Wednesday of last week and she has been staying in bed. Pt thought she just slept on the arm incorrectly, but she still is having numbness in the arm.  Pt does take eliquis currently.  Pt has even and unlabored respirations at this time and is A&Ox4.

## 2020-09-24 NOTE — ED Provider Notes (Signed)
[facility] Emergency Department Provider Note  ____________________________________________  Time seen: Approximately 12:58 PM  I have reviewed the triage vital signs and the nursing notes.   HISTORY  Chief Complaint Numbness    HPI Lauren Lloyd is a 80 y.o. female with a history of heart failure, arthritis, cholelithiasis, GERD who comes the ED complaining of right arm pain and swelling that started 2 days ago.  Gradual onset, worse with movement, no alleviating factors.  Denies any heavy lifting or trauma.  No falls.  Denies loss of sensation or motor strength.  No headache or vision change.  No neck pain.  No change in balance or coordination.  She notes that she was diagnosed with COVID 8 days ago and has been spending a lot of time in bed lately due to fatigue.  She is on Eliquis for atrial fibrillation stroke prevention, has been compliant.      Past Medical History:  Diagnosis Date  . (HFpEF) heart failure with preserved ejection fraction ([place])    a. 05/2018 Echo: EF 55-60%, no rwma, mild to mod MR. Nl RV fxn. Mod TR. PASP 69mmHg.  . Arthritis    knees, Hands  . Arthritis of knee   . Back pain   . Carotid arterial disease ([place])    a. 03/2019 Carotid U/S: <50% bilat ICA stenoses.  . Cholelithiasis    a. 10/2018 noted on CT.  . Edema, lower extremity   . Fatty liver   . GERD (gastroesophageal reflux disease)   . History of stress test    a. 06/2018 MV: EF 59%, no ischemia/infarct. Low risk.  . Knee pain   . Lactose intolerance   . Mitral regurgitation    a. 05/2018 Echo: mild to mod MR.  . Multinodular goiter   . Obesity   . OSA (obstructive sleep apnea)   . PAF (paroxysmal atrial fibrillation) ([place])    a.  Diagnosed 12/19; b. 05/2018 s/p DCCV; c. 03/2019 & 05/2019 recurrent AFib-->managed w/ amio load; d. CHADS2VASc = 6 (CHF, HTN, age x 2, vascular disease, female)-->Eliquis & amio 100 qd.  [name] Kitchen PAH (pulmonary artery hypertension) ([place])   .  Scoliosis   . SOB (shortness of breath)   . Swallowing difficulty      Patient Active Problem List   Diagnosis Date Noted  . Avitaminosis D 08/03/2020  . Morbid obesity ([place]) 08/03/2020  . Chronic fatigue 08/03/2020  . Insomnia 08/03/2020  . Bradycardia 11/08/2019  . Dizziness 11/08/2019  . Dyspnea on exertion 08/28/2018  . Degenerative cervical spinal stenosis 07/26/2018  . Degenerative arthritis of left knee 07/26/2018  . Persistent atrial fibrillation ([place]) 06/06/2018  . Paresthesias 05/30/2018  . Chronic diastolic CHF (congestive heart failure) ([place]) 05/11/2018  . Centrilobular emphysema ([place]) 05/11/2018  . Hematuria 04/04/2017  . Leg cramps 04/04/2017  . Metatarsalgia of both feet 04/04/2017  . 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 Essential hypertension 01/03/2017  . Hemorrhoids 01/03/2017  . Healthcare maintenance 01/03/2017  . Chronic right shoulder pain 01/03/2017  . Pulmonary hypertension, unspecified ([place]) 04/02/2015  . Acid reflux 11/18/2014  . IBS (irritable bowel syndrome) 11/18/2014  . Primary osteoarthritis of one hip 10/03/2011  . L-S radiculopathy 09/29/2011  . Arthritis of knee, degenerative 09/29/2011  . S/P knee replacement 09/29/2011  . Non-toxic uninodular goiter 06/20/2009  . Cervical pain 08/20/2008  . OSA (obstructive sleep apnea) 12/12/2007  . Hypercholesteremia 07/30/2007     Past Surgical History:  Procedure Laterality Date  .  CARDIOVERSION N/A 06/15/2018   Procedure: CARDIOVERSION (CATH LAB);  Surgeon: Minna Merritts, MD;  Location: [facility] ORS;  Service: Cardiovascular;  Laterality: N/A;  . CATARACT EXTRACTION W/PHACO Right 01/25/2016   Procedure: CATARACT EXTRACTION PHACO AND INTRAOCULAR LENS PLACEMENT ([place]);  Surgeon: Ronnell Freshwater, MD;  Location: [place];  Service: Ophthalmology;  Laterality: Right;  RIGHT  . CATARACT EXTRACTION W/PHACO Left 02/22/2016   Procedure: CATARACT EXTRACTION PHACO AND INTRAOCULAR  LENS PLACEMENT (IOC);  Surgeon: Ronnell Freshwater, MD;  Location: [place];  Service: Ophthalmology;  Laterality: Left;  LEFT  . [place]  2010  . KNEE ARTHROSCOPY Right 2004  . REPLACEMENT TOTAL KNEE Right 2009   [facility]  . SKIN GRAFT Left 04/08/2013   Done on left index finger  . TONSILLECTOMY  1946     Prior to Admission medications   Medication Sig Start Date End Date Taking? Authorizing Provider  amiodarone (PACERONE) 100 MG tablet TAKE ONE TABLET BY MOUTH ONCE DAILY 09/22/20   Minna Merritts, MD  bisoprolol (ZEBETA) 5 MG tablet Take 0.5 tablet (2.5 mg) by mouth once daily 01/08/20   Theora Gianotti, NP  celecoxib (CELEBREX) 200 MG capsule Take by mouth daily. 04/16/20   [provider]  ELIQUIS 5 MG TABS tablet TAKE ONE TABLET BY MOUTH TWICE DAILY 06/19/20   Minna Merritts, MD  furosemide (LASIX) 40 MG tablet Take 1 tablet (40 mg total) by mouth 2 (two) times daily. Take twice a day anytime weight is 218 or higher. 07/03/20   Minna Merritts, MD  gabapentin (NEURONTIN) 300 MG capsule TAKE ONE CAPSULE BY MOUTH AT BEDTIME Patient not taking: Reported on 08/03/2020 03/19/20   [name], MD  loperamide (IMODIUM) 2 MG capsule Take 2 mg by mouth as needed.     [provider]  metaxalone (SKELAXIN) 800 MG tablet Take 800 mg by mouth 3 (three) times daily.     [provider]  Multiple Vitamin (MULTIVITAMIN) capsule Take 1 capsule by mouth daily.    [provider]  omeprazole (PRILOSEC) 20 MG capsule Take 1 capsule (20 mg total) by mouth daily. 05/11/20   Minna Merritts, MD  potassium chloride (KLOR-CON) 10 MEQ tablet TAKE 1 TABLET BY MOUH DAILY. 05/19/20   Minna Merritts, MD  VITAMIN D PO Take by mouth.    [provider]     Allergies Levofloxacin, Influenza vaccines, and Oysters [shellfish allergy]   Family History  Problem Relation Age of Onset  .  Hyperlipidemia Sister   . Atrial fibrillation Sister   . Transient ischemic attack Mother   . Heart disease Mother   . Heart attack Father   . High blood pressure Father   . Stroke Father   . Healthy Brother   . Breast cancer Sister 78  . Hyperlipidemia Sister   . Atrial fibrillation Sister     Social History Social History   Tobacco Use  . Smoking status: Former Smoker    Packs/day: 1.00    Years: 30.00    Pack years: 30.00    Types: Cigarettes    Quit date: 05/16/1989    Years since quitting: 31.3  . Smokeless tobacco: Never Used  Vaping Use  . Vaping Use: Never used  Substance Use Topics  . Alcohol use: Yes    Alcohol/week: 7.0 standard drinks    Types: 7 Glasses of wine per week    Comment: 0-2  a night  . Drug use: No    Review of Systems  Constitutional:   No fever or chills.  ENT:   No sore throat. No rhinorrhea. Cardiovascular:   No chest pain or syncope. Respiratory:   No dyspnea or cough. Gastrointestinal:   Negative for abdominal pain, vomiting and diarrhea.  Musculoskeletal:   Right forearm pain and swelling as above All other systems reviewed and are negative except as documented above in ROS and HPI.  ____________________________________________   PHYSICAL EXAM:  VITAL SIGNS: ED Triage Vitals [09/24/20 0945]  Enc Vitals Group     BP (!) 129/48     Pulse Rate (!) 51     Resp 17     Temp 98.6 F (37 C)     Temp Source Oral     SpO2 96 %     Weight 204 lb (92.5 kg)     Height 5\' 7"  (1.702 m)     Head Circumference      Peak Flow      Pain Score 9     Pain Loc      Pain Edu?      Excl. in [place]?     Vital signs reviewed, nursing assessments reviewed.   Constitutional:   Alert and oriented. Non-toxic appearance. Eyes:   Conjunctivae are normal. EOMI. PERRL. ENT      Head:   Normocephalic and atraumatic.      Nose: Normal      Mouth/Throat: Normal, moist mucosa.      Neck:   No meningismus. Full  ROM. Hematological/Lymphatic/Immunilogical:   No cervical lymphadenopathy. Cardiovascular:   RRR. Symmetric bilateral radial and DP pulses.  No murmurs. Cap refill less than 2 seconds. Respiratory:   Normal respiratory effort without tachypnea/retractions. Breath sounds are clear and equal bilaterally. No wheezes/rales/rhonchi. Gastrointestinal:   Soft and nontender. Non distended. There is no CVA tenderness.  No rebound, rigidity, or guarding. Genitourinary:   deferred Musculoskeletal:   Normal range of motion in all extremities. No joint effusions.  There is tenderness at the wrist.  No bony point tenderness.  No surrounding erythema or warmth.  No induration.  No wounds.  No crepitus.  There is mild swelling of the right forearm compared to left. Neurologic:   Normal speech and language.  Motor grossly intact. No acute focal neurologic deficits are appreciated.  Skin:    Skin is warm, dry and intact. No rash noted.  No petechiae, purpura, or bullae.  ____________________________________________    LABS (pertinent positives/negatives) (all labs ordered are listed, but only abnormal results are displayed) Labs Reviewed  CBC - Abnormal; Notable for the following components:      Result Value   WBC 14.4 (*)    All other components within normal limits  DIFFERENTIAL - Abnormal; Notable for the following components:   Neutro Abs 11.0 (*)    Monocytes Absolute 1.4 (*)    Abs Immature Granulocytes 0.08 (*)    All other components within normal limits  COMPREHENSIVE METABOLIC PANEL - Abnormal; Notable for the following components:   CO2 21 (*)    Glucose, Bld 127 (*)    All other components within normal limits  URIC ACID - Abnormal; Notable for the following components:   Uric Acid, Serum 7.2 (*)    All other components within normal limits  PROTIME-INR  APTT   ____________________________________________   EKG Interpreted by me Sinus bradycardia rate of 51.  Normal axis and  intervals.  Poor R wave progression.  Normal ST segments and T waves.  No ischemic changes.   ____________________________________________    RADIOLOGY  DG Wrist Complete Right  Result Date: 09/24/2020 CLINICAL DATA:  Right wrist pain and swelling.  No reported injury. EXAM: RIGHT WRIST - COMPLETE 3+ VIEW COMPARISON:  None. FINDINGS: No fracture or dislocation. No suspicious focal osseous lesions. Severe first carpometacarpal joint osteoarthritis. Moderate interphalangeal joint right thumb osteoarthritis. Mild scattered chondrocalcinosis in the right wrist, most prominent adjacent to the radial margin of the distal scaphoid and at the triangular fibrocartilage. No radiopaque foreign bodies. IMPRESSION: 1. Severe first carpometacarpal joint osteoarthritis. 2. Mild scattered chondrocalcinosis in the right wrist, suggesting CPPD arthropathy. Electronically Signed   By: Ilona Sorrel M.D.   On: 09/24/2020 12:27   CT HEAD WO CONTRAST  Result Date: 09/24/2020 CLINICAL DATA:  Numbness or tingling involving the right arm EXAM: CT HEAD WITHOUT CONTRAST TECHNIQUE: Contiguous axial images were obtained from the base of the skull through the vertex without intravenous contrast. COMPARISON:  Brain MRI 05/30/2018 FINDINGS: Brain: No evidence of acute infarction, hemorrhage, hydrocephalus, extra-axial collection or mass lesion/mass effect. Vascular: No hyperdense vessel or unexpected calcification. Skull: Normal. Negative for fracture or focal lesion. Sinuses/Orbits: No acute finding. IMPRESSION: Age normal head CT. Electronically Signed   By: Monte Fantasia M.D.   On: 09/24/2020 11:16   US Venous Img Upper Uni Right(DVT)  Result Date: 09/24/2020 CLINICAL DATA:  Right upper extremity pain and swelling for 2 days. EXAM: RIGHT UPPER EXTREMITY VENOUS DOPPLER ULTRASOUND TECHNIQUE: Gray-scale sonography with graded compression, as well as color Doppler and duplex ultrasound were performed to evaluate the upper  extremity deep venous system from the level of the subclavian vein and including the jugular, axillary, basilic, radial, ulnar and upper cephalic vein. Spectral Doppler was utilized to evaluate flow at rest and with distal augmentation maneuvers. COMPARISON:  None. FINDINGS: Contralateral Subclavian Vein: Respiratory phasicity is normal and symmetric with the symptomatic side. No evidence of thrombus. Normal compressibility. Internal Jugular Vein: No evidence of thrombus. Normal compressibility, respiratory phasicity and response to augmentation. Subclavian Vein: No evidence of thrombus. Normal compressibility, respiratory phasicity and response to augmentation. Axillary Vein: No evidence of thrombus. Normal compressibility, respiratory phasicity and response to augmentation. Cephalic Vein: No evidence of thrombus. Normal compressibility, respiratory phasicity and response to augmentation. Basilic Vein: No evidence of thrombus. Normal compressibility, respiratory phasicity and response to augmentation. Brachial Veins: No evidence of thrombus. Normal compressibility, respiratory phasicity and response to augmentation. Radial Veins: No evidence of thrombus. Normal compressibility, respiratory phasicity and response to augmentation. Ulnar Veins: No evidence of thrombus. Normal compressibility, respiratory phasicity and response to augmentation. Venous Reflux:  None visualized. Other Findings:  None visualized. IMPRESSION: No evidence of DVT within the right upper extremity. Electronically Signed   By: Ilona Sorrel M.D.   On: 09/24/2020 12:31    ____________________________________________   PROCEDURES Procedures  ____________________________________________  DIFFERENTIAL DIAGNOSIS   Wrist fracture, gout, other arthritis, upper extremity DVT, carpal tunnel  CLINICAL IMPRESSION / ASSESSMENT AND PLAN / ED COURSE  Medications ordered in the ED: Medications  famotidine (PEPCID) tablet 40 mg (40 mg Oral  Given 09/24/20 1154)  naproxen (NAPROSYN) tablet 500 mg (500 mg Oral Given 09/24/20 1154)    Pertinent labs & imaging results that were available during my care of the patient were reviewed by me and considered in my medical decision making (see chart for details).  Lauren Lloyd was evaluated in Emergency  Department on 09/24/2020 for the symptoms described in the history of present illness. She was evaluated in the context of the global COVID-19 pandemic, which necessitated consideration that the patient might be at risk for infection with the SARS-CoV-2 virus that causes COVID-19. Institutional protocols and algorithms that pertain to the evaluation of patients at risk for COVID-19 are in a state of rapid change based on information released by regulatory bodies including the CDC and federal and state organizations. These policies and algorithms were followed during the patient's care in the ED.     Clinical Course as of 09/24/20 1941  Thu Sep 24, 2020  1130 Patient presents with pain and swelling in the distal right arm from mid forearm to wrist.  It is clearly musculoskeletal, but with recent COVID, will also obtain an ultrasound to rule out DVT.  No history of trauma but with more focal tenderness at the wrist will obtain x-ray.  Suspect carpal tunnel.  Like outpatient plan for NSAIDs, prednisone, GI prophylaxis, wrist brace. [PS]  1306 Uric acid is on the high end of normal, x-ray suggestive of pseudogout.  Ultrasound negative for DVT.  CT scan of the head unremarkable, without findings to suggest ICH tumor or acute stroke.  Will treat with steroid taper, follow-up with orthopedics. [PS]    Clinical Course User Index [PS] Carrie Mew, MD     ____________________________________________   FINAL CLINICAL IMPRESSION(S) / ED DIAGNOSES    Final diagnoses:  Arm swelling  Arm pain, right  Pseudogout of right wrist   ED Discharge Orders    None      Portions of this note were  generated with dragon dictation software. Dictation errors may occur despite best attempts at proofreading.   Carrie Mew, MD 09/24/20 1942

## 2020-09-24 NOTE — Discharge Instructions (Signed)
Your x-ray shows calcium pyrophosphate arthritis in your wrist which is causing your symptoms.  This can be managed with a course of prednisone.  There are no fractures.  The ultrasound is negative for a DVT.  Your blood tests are okay.

## 2020-09-24 NOTE — ED Notes (Signed)
Levada Dy in lab to add on uric acid to previous samples sent.

## 2020-09-24 NOTE — Telephone Encounter (Signed)
Noted  

## 2020-09-30 DIAGNOSIS — M47816 Spondylosis without myelopathy or radiculopathy, lumbar region: Secondary | ICD-10-CM | POA: Diagnosis not present

## 2020-09-30 DIAGNOSIS — M1812 Unilateral primary osteoarthritis of first carpometacarpal joint, left hand: Secondary | ICD-10-CM | POA: Diagnosis not present

## 2020-10-01 ENCOUNTER — Telehealth: Payer: Medicare HMO

## 2020-10-02 ENCOUNTER — Telehealth: Payer: Medicare HMO

## 2020-10-02 NOTE — Progress Notes (Deleted)
Chronic Care Management Pharmacy Note  10/02/2020 Name:  Lauren Lloyd MRN:  [id number] DOB:  14-Nov-1940  Subjective: Lauren Lloyd is an 80 y.o. year old female who is a primary patient of Bacigalupo, Dionne Bucy, MD.  The CCM team was consulted for assistance with disease management and care coordination needs.    Engaged with patient by telephone for follow up visit in response to provider referral for pharmacy case management and/or care coordination services.   Consent to Services:  The patient was given information about Chronic Care Management services, agreed to services, and gave verbal consent prior to initiation of services.  Please see initial visit note for detailed documentation.   Patient Care Team: [name], MD as PCP - General (Family Medicine) Minna Merritts, MD as PCP - Cardiology (Cardiology) Sharlet Salina, MD as Referring Physician (Physical Medicine and Rehabilitation) [place], [place] Aleskerov, Luvenia Heller, MD as Consulting Physician (Pulmonary Disease) Dasher, Rayvon Char, MD (Dermatology) [name], DO as Referring Physician (Family Medicine) Germaine Pomfret, [facility] as Pharmacist (Pharmacist)  Recent office visits: 08/03/20: Patient presented to Dr. Brita Romp for follow-up. LDL 188   Recent consult visits: 05/11/20: Patient presented to Dr. Rockey Situ (cardiology). Omeprazole started. Amiodraonte decreased to 100 mg daily. Ergocalciferol stopped.   Hospital visits: 09/24/20: Patient presented to ED for pseudogout of right wrist.   Objective:  Lab Results  Component Value Date   CREATININE 0.89 09/24/2020   BUN 21 09/24/2020   GFRNONAA >60 09/24/2020   GFRAA 75 12/09/2019   NA 136 09/24/2020   K 3.9 09/24/2020   CALCIUM 9.0 09/24/2020   CO2 21 (L) 09/24/2020   GLUCOSE 127 (H) 09/24/2020    Lab Results  Component Value Date/Time   HGBA1C 5.5 12/09/2019 02:11 PM    Last diabetic Eye exam: No results found for: HMDIABEYEEXA   Last diabetic Foot exam: No results found for: HMDIABFOOTEX   Lab Results  Component Value Date   CHOL 289 (H) 08/03/2020   HDL 65 08/03/2020   LDLCALC 188 (H) 08/03/2020   TRIG 193 (H) 08/03/2020   CHOLHDL 4.4 08/03/2020    Hepatic Function Latest Ref Rng & Units 09/24/2020 08/03/2020 12/09/2019  Total Protein 6.5 - 8.1 g/dL 6.9 6.8 6.8  Albumin 3.5 - 5.0 g/dL 3.6 4.3 4.3  AST 15 - 41 U/L _0 ALT 0 - 44 U/L _1 Alk Phosphatase 38 - 126 U/L 73 108 106  Total Bilirubin 0.3 - 1.2 mg/dL 0.9 0.4 0.4    Lab Results  Component Value Date/Time   TSH 1.020 08/03/2020 10:12 AM   TSH 0.500 12/09/2019 02:11 PM   FREET4 1.74 12/09/2019 02:11 PM    CBC Latest Ref Rng & Units 09/24/2020 08/03/2020 04/01/2019  WBC 4.0 - 10.5 K/uL 14.4(H) 6.8 8.8  Hemoglobin 12.0 - 15.0 g/dL 12.8 13.6 13.0  Hematocrit 36.0 - 46.0 % 37.4 40.4 38.4  Platelets 150 - 400 K/uL 278 243 274    Lab Results  Component Value Date/Time   VD25OH 27.3 (L) 08/03/2020 10:12 AM   VD25OH 24.4 (L) 12/09/2019 02:11 PM    Clinical ASCVD: No  The ASCVD Risk score Mikey Bussing DC Jr., et al., 2013) failed to calculate for the following reasons:   The 2013 ASCVD risk score is only valid for ages 52 to 73    Depression screen PHQ 2/9 08/03/2020 02/03/2020 12/09/2019  Decreased Interest 0 0 3  Down, Depressed, Hopeless 0  0 1  PHQ - 2 Score 0 0 4  Altered sleeping _0 Tired, decreased energy _1 Change in appetite 0 2 1  Feeling bad or failure about yourself  0 0 1  Trouble concentrating 0 0 0  Moving slowly or fidgety/restless 0 0 0  Suicidal thoughts 0 0 0  PHQ-9 Score _2 Difficult doing work/chores Not difficult at all Not difficult at all Not difficult at all     Social History   Tobacco Use  Smoking Status Former Smoker  . Packs/day: 1.00  . Years: 30.00  . Pack years: 30.00  . Types: Cigarettes  . Quit date: 05/16/1989  . Years since quitting: 31.4  Smokeless Tobacco Never Used   BP Readings  from Last 3 Encounters:  09/24/20 (!) 130/52  08/03/20 138/68  05/11/20 (!) 144/74   Pulse Readings from Last 3 Encounters:  09/24/20 60  08/03/20 (!) 48  05/11/20 60   Wt Readings from Last 3 Encounters:  09/24/20 204 lb (92.5 kg)  08/03/20 218 lb (98.9 kg)  05/11/20 208 lb (94.3 kg)   BMI Readings from Last 3 Encounters:  09/24/20 31.95 kg/m  08/03/20 35.19 kg/m  05/11/20 33.57 kg/m    Assessment/Interventions: Review of patient past medical history, allergies, medications, health status, including review of consultants reports, laboratory and other test data, was performed as part of comprehensive evaluation and provision of chronic care management services.   SDOH:  (Social Determinants of Health) assessments and interventions performed: Yes  SDOH Screenings   Alcohol Screen: Low Risk   . Last Alcohol Screening Score (AUDIT): 4  Depression (PHQ2-9): Medium Risk  . PHQ-2 Score: 6  Financial Resource Strain: High Risk  . Difficulty of Paying Living Expenses: Hard  Food Insecurity: No Food Insecurity  . Worried About Charity fundraiser in the Last Year: Never true  . Ran Out of Food in the Last Year: Never true  Housing: Low Risk   . Last Housing Risk Score: 0  Physical Activity: Insufficiently Active  . Days of Exercise per Week: 2 days  . Minutes of Exercise per Session: 40 min  Social Connections: Moderately Integrated  . Frequency of Communication with Friends and Family: More than three times a week  . Frequency of Social Gatherings with Friends and Family: More than three times a week  . Attends Religious Services: More than 4 times per year  . Active Member of Clubs or Organizations: Yes  . Attends Archivist Meetings: More than 4 times per year  . Marital Status: Divorced  Stress: No Stress Concern Present  . Feeling of Stress : Not at all  Tobacco Use: Medium Risk  . Smoking Tobacco Use: Former Smoker  . Smokeless Tobacco Use: Never Used   Transportation Needs: No Transportation Needs  . Lack of Transportation (Medical): No  . Lack of Transportation (Non-Medical): No    CCM Care Plan  Allergies  Allergen Reactions  . Levofloxacin     Other reaction(s): Joint Pains Other reaction(s): Other (See Comments) Joint pain  . Influenza Vaccines Other (See Comments)    Bell's Palsy  . Oysters [Shellfish Allergy] Swelling    She states she had eaten them three days in a row and she developed swelling around her eyes.     Medications Reviewed Today    Reviewed by [name], MD (Physician) on 08/03/20 at [phone]  Med List Status: <None>  Medication  Order Taking? Sig Documenting Provider Last Dose Status Informant  amiodarone (PACERONE) 100 MG tablet [id number] Yes Take 1 tablet (100 mg total) by mouth daily. Minna Merritts, MD Taking Active   bisoprolol (ZEBETA) 5 MG tablet [id number] Yes Take 0.5 tablet (2.5 mg) by mouth once daily Theora Gianotti, NP Taking Active   celecoxib (CELEBREX) 200 MG capsule [id number] Yes Take by mouth daily. [provider] Taking Active   ELIQUIS 5 MG TABS tablet [id number] Yes TAKE ONE TABLET BY MOUTH TWICE DAILY Gollan, Kathlene November, MD Taking Active   furosemide (LASIX) 40 MG tablet [id number] Yes Take 1 tablet (40 mg total) by mouth 2 (two) times daily. Take twice a day anytime weight is 218 or higher. Minna Merritts, MD Taking Active   gabapentin (NEURONTIN) 300 MG capsule [id number] No TAKE ONE CAPSULE BY MOUTH AT BEDTIME  Patient not taking: Reported on 08/03/2020   [name], MD Not Taking Active   loperamide (IMODIUM) 2 MG capsule [id number] Yes Take 2 mg by mouth as needed.  [provider] Taking Active   metaxalone (SKELAXIN) 800 MG tablet [id number] Yes Take 800 mg by mouth 3 (three) times daily.  [provider] Taking Active            Med Note Coralee Pesa May 11, 2020  8:12 AM)    Multiple Vitamin (MULTIVITAMIN) capsule  [id number] Yes Take 1 capsule by mouth daily. [provider] Taking Active Self  omeprazole (PRILOSEC) 20 MG capsule [id number] Yes Take 1 capsule (20 mg total) by mouth daily. Minna Merritts, MD Taking Active   potassium chloride (KLOR-CON) 10 MEQ tablet [id number] Yes TAKE 1 TABLET BY MOUH DAILY. Minna Merritts, MD Taking Active   VITAMIN D PO [id number] Yes Take by mouth. [provider] Taking Active           Patient Active Problem List   Diagnosis Date Noted  . Avitaminosis D 08/03/2020  . Morbid obesity ([place]) 08/03/2020  . Chronic fatigue 08/03/2020  . Insomnia 08/03/2020  . Bradycardia 11/08/2019  . Dizziness 11/08/2019  . Dyspnea on exertion 08/28/2018  . Degenerative cervical spinal stenosis 07/26/2018  . Degenerative arthritis of left knee 07/26/2018  . Persistent atrial fibrillation ([place]) 06/06/2018  . Paresthesias 05/30/2018  . Chronic diastolic CHF (congestive heart failure) ([place]) 05/11/2018  . Centrilobular emphysema ([place]) 05/11/2018  . Hematuria 04/04/2017  . Leg cramps 04/04/2017  . Metatarsalgia of both feet 04/04/2017  . 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 Essential hypertension 01/03/2017  . Hemorrhoids 01/03/2017  . Healthcare maintenance 01/03/2017  . Chronic right shoulder pain 01/03/2017  . Pulmonary hypertension, unspecified ([place]) 04/02/2015  . Acid reflux 11/18/2014  . IBS (irritable bowel syndrome) 11/18/2014  . Primary osteoarthritis of one hip 10/03/2011  . L-S radiculopathy 09/29/2011  . Arthritis of knee, degenerative 09/29/2011  . S/P knee replacement 09/29/2011  . Non-toxic uninodular goiter 06/20/2009  . Cervical pain 08/20/2008  . OSA (obstructive sleep apnea) 12/12/2007  . Hypercholesteremia 07/30/2007    Immunization History  Administered Date(s) Administered  . Hepatitis A 11/25/1999, 09/19/2001  . Hepatitis A, Adult 11/25/1999, 09/19/2001  . IPV 02/11/2000  . PFIZER(Purple Top)SARS-COV-2 Vaccination  06/14/2019, 07/05/2019, 04/15/2020  . Pneumococcal Conjugate-13 10/06/2014  . Pneumococcal Polysaccharide-23 12/01/2010  . Td 02/02/1998  . Tdap 09/11/2007, 02/27/2012  . Typhoid Inactivated 02/11/2000  . Zoster Recombinat (Shingrix) 02/06/2020    Conditions to be addressed/monitored:  Hypertension, Hyperlipidemia, Atrial  Fibrillation, Heart Failure and Osteoarthritis  There are no care plans that you recently modified to display for this patient.    Medication Assistance: {MEDASSISTANCEINFO:25044}  Patient's preferred pharmacy is:  [place], [place] - [address] [place] [address] [place] [address] Phone: [phone] Fax: [phone]  [facility] [place], [place] - [address] [place] [address] Phone: [phone] Fax: [phone]  Uses pill box? {Yes or If no, why not?:20788} Pt endorses ***% compliance  We discussed: {Pharmacy options:24294} Patient decided to: {US Pharmacy Plan:23885}  Care Plan and Follow Up Patient Decision:  {FOLLOWUP:24991}  Plan: {CM FOLLOW UP QQUI:11464}  ***  Current Barriers:  . {pharmacybarriers:24917}  Pharmacist Clinical Goal(s):  [name] Kitchen Patient will {PHARMACYGOALCHOICES:24921} through collaboration with PharmD and provider.   Interventions: . 1:1 collaboration with Brita Romp Dionne Bucy, MD regarding development and update of comprehensive plan of care as evidenced by provider attestation and co-signature . Inter-disciplinary care team collaboration (see longitudinal plan of care) . Comprehensive medication review performed; medication list updated in electronic medical record  Heart Failure (Goal: manage symptoms and prevent exacerbations) -{US controlled/uncontrolled:25276} -Last ejection fraction: *** (Date: ***) -HF type: {type of heart failure:30421350} -NYHA Class: {CHL HP Upstream Pharm NYHA [phone]} -AHA HF Stage: {CHL HP Upstream Pharm  AHA HF [phone]} -Current treatment: . Bisoprolol 5 mg 1/2 tablet daily  . Furosemide 40 mg twice daily  -Medications previously tried: ***  -Current home BP/HR readings: *** -Current dietary habits: *** -Current exercise habits: *** -Educated on {CCM HF Counseling:25125} -{CCMPHARMDINTERVENTION:25122}  Atrial Fibrillation (Goal: prevent stroke and major bleeding) -{US controlled/uncontrolled:25276} -CHADSVASC: *** -Current treatment: . Rate control: Amiodarone 100 mg daily  . Anticoagulation: Eliquis 5 mg twice daily  -Medications previously tried: *** -Home BP and HR readings: ***  -Counseled on {CCMAFIBCOUNSELING:25120} -{CCMPHARMDINTERVENTION:25122}  Hyperlipidemia: (LDL goal < ***) -{US controlled/uncontrolled:25276} -Current treatment: . *** -Medications previously tried: ***  -Current dietary patterns: *** -Current exercise habits: *** -Educated on {CCM HLD Counseling:25126} -{CCMPHARMDINTERVENTION:25122}  *** (Goal: ***) -{US controlled/uncontrolled:25276} -Current treatment  . Omeprazole 20 mg daily  -Medications previously tried: ***  -{CCMPHARMDINTERVENTION:25122}  *** (Goal: ***) -{US controlled/uncontrolled:25276} -Current treatment  . Celecoxib 200 mg daily  . Gabapentin 300 mg nightly  . Metaxalone 800 mg three times daily  . Prednisone 10 mg  . Tramadol 50 mg every 6 hours as needed  -Medications previously tried: ***  -{CCMPHARMDINTERVENTION:25122}    Patient Goals/Self-Care Activities . Patient will:  - {pharmacypatientgoals:24919}  Follow Up Plan: {CM FOLLOW UP VXUC:76701}

## 2020-10-27 DIAGNOSIS — D2272 Melanocytic nevi of left lower limb, including hip: Secondary | ICD-10-CM | POA: Diagnosis not present

## 2020-10-27 DIAGNOSIS — L821 Other seborrheic keratosis: Secondary | ICD-10-CM | POA: Diagnosis not present

## 2020-10-27 DIAGNOSIS — D2261 Melanocytic nevi of right upper limb, including shoulder: Secondary | ICD-10-CM | POA: Diagnosis not present

## 2020-10-27 DIAGNOSIS — D2262 Melanocytic nevi of left upper limb, including shoulder: Secondary | ICD-10-CM | POA: Diagnosis not present

## 2020-10-27 DIAGNOSIS — Z85828 Personal history of other malignant neoplasm of skin: Secondary | ICD-10-CM | POA: Diagnosis not present

## 2020-10-27 DIAGNOSIS — D225 Melanocytic nevi of trunk: Secondary | ICD-10-CM | POA: Diagnosis not present

## 2020-10-28 ENCOUNTER — Other Ambulatory Visit: Payer: Self-pay | Admitting: Cardiovascular Disease

## 2020-11-05 DIAGNOSIS — M47816 Spondylosis without myelopathy or radiculopathy, lumbar region: Secondary | ICD-10-CM | POA: Diagnosis not present

## 2020-11-05 DIAGNOSIS — M5136 Other intervertebral disc degeneration, lumbar region: Secondary | ICD-10-CM | POA: Diagnosis not present

## 2020-11-05 DIAGNOSIS — M48062 Spinal stenosis, lumbar region with neurogenic claudication: Secondary | ICD-10-CM | POA: Diagnosis not present

## 2020-11-09 ENCOUNTER — Other Ambulatory Visit: Payer: Self-pay | Admitting: Cardiovascular Disease

## 2020-11-10 DIAGNOSIS — G4733 Obstructive sleep apnea (adult) (pediatric): Secondary | ICD-10-CM | POA: Diagnosis not present

## 2020-11-10 DIAGNOSIS — M1712 Unilateral primary osteoarthritis, left knee: Secondary | ICD-10-CM | POA: Diagnosis not present

## 2020-11-10 DIAGNOSIS — Z01818 Encounter for other preprocedural examination: Secondary | ICD-10-CM | POA: Diagnosis not present

## 2020-11-18 ENCOUNTER — Telehealth: Payer: Self-pay | Admitting: Cardiovascular Disease

## 2020-11-18 NOTE — Telephone Encounter (Signed)
   [facility] Pre-operative Risk Assessment    Patient Name: Lauren Lloyd  DOB: [birth year]  MRN: [id number]   HEARTCARE STAFF: - Please ensure there is not already an duplicate clearance open for this procedure. - Under Visit Info/Reason for Call, type in Other and utilize the format Clearance MM/DD/YY or Clearance TBD. Do not use dashes or single digits. - If request is for dental extraction, please clarify the # of teeth to be extracted. - If the patient is currently at the dentist's office, call Pre-Op APP to address. If the patient is not currently in the dentist office, please route to the Pre-Op pool  Request for surgical clearance:  What type of surgery is being performed? Joint replacement    When is this surgery scheduled? TBD   What type of clearance is required (medical clearance vs. Pharmacy clearance to hold med vs. Both)? both  Are there any medications that need to be held prior to surgery and how long?stop eliquis prior to surgery - is any bridging required   Practice name and name of physician performing surgery? [facility] - Dr Erby Pian   What is the office phone number? [phone]   7.   What is the office fax number? [phone]  8.   Anesthesia type (None, local, MAC, general) ? Not listed    Ace Gins 11/18/2020, 3:05 PM  _________________________________________________________________   (provider comments below)

## 2020-11-18 NOTE — Telephone Encounter (Addendum)
   Name: Lauren Lloyd  DOB: March 23, 1941  MRN: [id number]   Primary Cardiologist: Ida Rogue, MD  Chart reviewed as part of pre-operative protocol coverage.  [name] was last seen on 04/2020. Will route to pharm for input on anticoag then patient will need call.  Charlie Pitter, PA-C 11/18/2020, 5:15 PM

## 2020-11-19 NOTE — Telephone Encounter (Signed)
Patient with diagnosis of afib on Eliquis for anticoagulation.    Procedure: TKA Date of procedure: 12/02/20  CHA2DS2-VASc Score = 6  This indicates a 9.7% annual risk of stroke. [name] score is based upon: CHF History: Yes HTN History: Yes Diabetes History: No Stroke History: No Vascular Disease History: Yes Age Score: 2 Gender Score: 1   CrCl 24mL/min Platelet count 278K  Clearance form states joint replacement, looks as though pt is scheduled for TKA on 7/20. Recommend holding Eliquis for 3 days prior.

## 2020-11-20 ENCOUNTER — Telehealth: Payer: Self-pay | Admitting: Cardiovascular Disease

## 2020-11-20 NOTE — Telephone Encounter (Signed)
Attempted to reach pt via phone, unable to make contact, LMTCB

## 2020-11-20 NOTE — Telephone Encounter (Signed)
Patient calling  Needs a medication clarification - believes she got them mixed up this morning  Please call to discuss

## 2020-11-20 NOTE — Telephone Encounter (Addendum)
   Name: Lauren Lloyd  DOB: January 16, 1941  MRN: [id number]   Primary Cardiologist: Ida Rogue, MD  Chart reviewed as part of pre-operative protocol coverage for an upcoming TKA on 12/02/20.   Lauren Lloyd was last seen on 04/2020 by Dr. Rockey Situ.  She has history of PAF on Eliquis and amiodarone, chronic diastolic CHF, PHTN, and Ao atherosclerosis.  I called [name] 11/20/20 and she states that she is doing well without any chest pain and that her breathing is poor but stable for over 1 year. She is unable to walk a block on level ground due only to orthopedic pain of her back (with associated sciatica) and knee. She denies any SOB / DOE with walking 1 block. The patient was advised that if she develops new symptoms prior to surgery to contact our office to arrange for a follow-up visit, and she verbalized understanding.   Based on the above, calculated RCRI score is 0.9%, placing her at low risk for MACE. According to ACC/AHA guidelines, the patient would be at acceptable risk for the planned procedure without further cardiovascular testing.   Pharmacy has recommended that the pt hold her Eliquis for 3 days prior.   I will route this recommendation to the requesting party via Epic fax function and remove from pre-op pool. Please call with questions.  Arvil Chaco, PA-C 11/20/2020, 8:52 AM

## 2020-11-20 NOTE — Telephone Encounter (Signed)
Attempted to reach out to pt again, no answer, did LDM on VM (DPR approved). She had spoken to Blanche East, PA-C this am and needed medication clarification.   They reviewed holding her Eliquis for 3 days prior to her upcoming surgery.  And Bisoprolol 2.5mg  Daily (5mg  pills cut in half).   Advised for any further questions to call back

## 2020-12-01 ENCOUNTER — Other Ambulatory Visit: Payer: Self-pay | Admitting: Family Medicine

## 2020-12-02 DIAGNOSIS — G8929 Other chronic pain: Secondary | ICD-10-CM | POA: Diagnosis not present

## 2020-12-02 DIAGNOSIS — Z87891 Personal history of nicotine dependence: Secondary | ICD-10-CM | POA: Diagnosis not present

## 2020-12-02 DIAGNOSIS — Z8616 Personal history of COVID-19: Secondary | ICD-10-CM | POA: Diagnosis not present

## 2020-12-02 DIAGNOSIS — I110 Hypertensive heart disease with heart failure: Secondary | ICD-10-CM | POA: Diagnosis not present

## 2020-12-02 DIAGNOSIS — I4819 Other persistent atrial fibrillation: Secondary | ICD-10-CM | POA: Diagnosis not present

## 2020-12-02 DIAGNOSIS — Z89022 Acquired absence of left finger(s): Secondary | ICD-10-CM | POA: Diagnosis not present

## 2020-12-02 DIAGNOSIS — Z96651 Presence of right artificial knee joint: Secondary | ICD-10-CM | POA: Diagnosis not present

## 2020-12-02 DIAGNOSIS — Z9109 Other allergy status, other than to drugs and biological substances: Secondary | ICD-10-CM | POA: Diagnosis not present

## 2020-12-02 DIAGNOSIS — M25762 Osteophyte, left knee: Secondary | ICD-10-CM | POA: Diagnosis not present

## 2020-12-02 DIAGNOSIS — M21162 Varus deformity, not elsewhere classified, left knee: Secondary | ICD-10-CM | POA: Diagnosis not present

## 2020-12-02 DIAGNOSIS — Z79899 Other long term (current) drug therapy: Secondary | ICD-10-CM | POA: Diagnosis not present

## 2020-12-02 DIAGNOSIS — Z9119 Patient's noncompliance with other medical treatment and regimen: Secondary | ICD-10-CM | POA: Diagnosis not present

## 2020-12-02 DIAGNOSIS — I495 Sick sinus syndrome: Secondary | ICD-10-CM | POA: Diagnosis not present

## 2020-12-02 DIAGNOSIS — Z888 Allergy status to other drugs, medicaments and biological substances status: Secondary | ICD-10-CM | POA: Diagnosis not present

## 2020-12-02 DIAGNOSIS — I2510 Atherosclerotic heart disease of native coronary artery without angina pectoris: Secondary | ICD-10-CM | POA: Diagnosis not present

## 2020-12-02 DIAGNOSIS — M549 Dorsalgia, unspecified: Secondary | ICD-10-CM | POA: Diagnosis not present

## 2020-12-02 DIAGNOSIS — Z881 Allergy status to other antibiotic agents status: Secondary | ICD-10-CM | POA: Diagnosis not present

## 2020-12-02 DIAGNOSIS — M65862 Other synovitis and tenosynovitis, left lower leg: Secondary | ICD-10-CM | POA: Diagnosis not present

## 2020-12-02 DIAGNOSIS — M21262 Flexion deformity, left knee: Secondary | ICD-10-CM | POA: Diagnosis not present

## 2020-12-02 DIAGNOSIS — I779 Disorder of arteries and arterioles, unspecified: Secondary | ICD-10-CM | POA: Diagnosis not present

## 2020-12-02 DIAGNOSIS — M1612 Unilateral primary osteoarthritis, left hip: Secondary | ICD-10-CM | POA: Diagnosis not present

## 2020-12-02 DIAGNOSIS — I272 Pulmonary hypertension, unspecified: Secondary | ICD-10-CM | POA: Diagnosis not present

## 2020-12-02 DIAGNOSIS — E559 Vitamin D deficiency, unspecified: Secondary | ICD-10-CM | POA: Diagnosis not present

## 2020-12-02 DIAGNOSIS — Z7901 Long term (current) use of anticoagulants: Secondary | ICD-10-CM | POA: Diagnosis not present

## 2020-12-02 DIAGNOSIS — M21062 Valgus deformity, not elsewhere classified, left knee: Secondary | ICD-10-CM | POA: Diagnosis not present

## 2020-12-02 DIAGNOSIS — I10 Essential (primary) hypertension: Secondary | ICD-10-CM | POA: Diagnosis not present

## 2020-12-02 DIAGNOSIS — Z91013 Allergy to seafood: Secondary | ICD-10-CM | POA: Diagnosis not present

## 2020-12-02 DIAGNOSIS — Z01818 Encounter for other preprocedural examination: Secondary | ICD-10-CM | POA: Diagnosis not present

## 2020-12-02 DIAGNOSIS — G4733 Obstructive sleep apnea (adult) (pediatric): Secondary | ICD-10-CM | POA: Diagnosis not present

## 2020-12-02 DIAGNOSIS — Z20822 Contact with and (suspected) exposure to covid-19: Secondary | ICD-10-CM | POA: Diagnosis not present

## 2020-12-02 DIAGNOSIS — M659 Synovitis and tenosynovitis, unspecified: Secondary | ICD-10-CM | POA: Diagnosis not present

## 2020-12-02 DIAGNOSIS — M1712 Unilateral primary osteoarthritis, left knee: Secondary | ICD-10-CM | POA: Diagnosis not present

## 2020-12-02 DIAGNOSIS — E669 Obesity, unspecified: Secondary | ICD-10-CM | POA: Diagnosis not present

## 2020-12-02 DIAGNOSIS — I480 Paroxysmal atrial fibrillation: Secondary | ICD-10-CM | POA: Diagnosis not present

## 2020-12-02 DIAGNOSIS — I5032 Chronic diastolic (congestive) heart failure: Secondary | ICD-10-CM | POA: Diagnosis not present

## 2020-12-02 DIAGNOSIS — Z950 Presence of cardiac pacemaker: Secondary | ICD-10-CM | POA: Diagnosis not present

## 2020-12-02 DIAGNOSIS — K219 Gastro-esophageal reflux disease without esophagitis: Secondary | ICD-10-CM | POA: Diagnosis not present

## 2020-12-02 DIAGNOSIS — G8918 Other acute postprocedural pain: Secondary | ICD-10-CM | POA: Diagnosis not present

## 2020-12-02 HISTORY — PX: TOTAL KNEE ARTHROPLASTY: SHX125

## 2020-12-02 NOTE — Telephone Encounter (Signed)
   Notes to clinic:  Script requested has expired Review for continued use and refill    Requested Prescriptions  Pending Prescriptions Disp Refills   bisoprolol (ZEBETA) 5 MG tablet [Pharmacy Med Name: bisoprolol fumarate 5 mg tablet] 90 tablet 1    Sig: TAKE ONE-HALF (1/2) TABLET BY MOUTH TWICE DAILY      Cardiovascular:  Beta Blockers Passed - 12/01/2020  5:42 PM      Passed - Last BP in normal range    BP Readings from Last 1 Encounters:  09/24/20 (!) 130/52          Passed - Last Heart Rate in normal range    Pulse Readings from Last 1 Encounters:  09/24/20 60          Passed - Valid encounter within last 6 months    Recent Outpatient Visits           4 months ago Essential hypertension   [facility] [place], Dionne Bucy, MD   10 months ago Essential hypertension   [place], Dionne Bucy, MD   1 year ago Dizziness   [facility] [place], Dionne Bucy, MD   1 year ago Photodermatitis due to sun   [facility], Dionne Bucy, MD   1 year ago Essential hypertension   [place], Dionne Bucy, MD       Future Appointments             In 2 months Bacigalupo, Dionne Bucy, MD [facility] [place]

## 2020-12-05 DIAGNOSIS — I779 Disorder of arteries and arterioles, unspecified: Secondary | ICD-10-CM | POA: Insufficient documentation

## 2020-12-05 DIAGNOSIS — Z7901 Long term (current) use of anticoagulants: Secondary | ICD-10-CM | POA: Insufficient documentation

## 2020-12-06 DIAGNOSIS — G8918 Other acute postprocedural pain: Secondary | ICD-10-CM | POA: Insufficient documentation

## 2020-12-06 DIAGNOSIS — I4819 Other persistent atrial fibrillation: Secondary | ICD-10-CM | POA: Diagnosis not present

## 2020-12-07 DIAGNOSIS — I480 Paroxysmal atrial fibrillation: Secondary | ICD-10-CM | POA: Diagnosis not present

## 2020-12-08 ENCOUNTER — Telehealth: Payer: Self-pay | Admitting: Cardiovascular Disease

## 2020-12-08 DIAGNOSIS — I4891 Unspecified atrial fibrillation: Secondary | ICD-10-CM | POA: Diagnosis not present

## 2020-12-08 DIAGNOSIS — I2510 Atherosclerotic heart disease of native coronary artery without angina pectoris: Secondary | ICD-10-CM | POA: Diagnosis not present

## 2020-12-08 DIAGNOSIS — M25562 Pain in left knee: Secondary | ICD-10-CM | POA: Diagnosis not present

## 2020-12-08 DIAGNOSIS — K219 Gastro-esophageal reflux disease without esophagitis: Secondary | ICD-10-CM | POA: Diagnosis not present

## 2020-12-08 DIAGNOSIS — R2681 Unsteadiness on feet: Secondary | ICD-10-CM | POA: Diagnosis not present

## 2020-12-08 DIAGNOSIS — I272 Pulmonary hypertension, unspecified: Secondary | ICD-10-CM | POA: Diagnosis not present

## 2020-12-08 DIAGNOSIS — J99 Respiratory disorders in diseases classified elsewhere: Secondary | ICD-10-CM | POA: Diagnosis not present

## 2020-12-08 DIAGNOSIS — I10 Essential (primary) hypertension: Secondary | ICD-10-CM | POA: Diagnosis not present

## 2020-12-08 DIAGNOSIS — Z96651 Presence of right artificial knee joint: Secondary | ICD-10-CM | POA: Diagnosis not present

## 2020-12-08 DIAGNOSIS — M1712 Unilateral primary osteoarthritis, left knee: Secondary | ICD-10-CM | POA: Diagnosis not present

## 2020-12-08 DIAGNOSIS — Z471 Aftercare following joint replacement surgery: Secondary | ICD-10-CM | POA: Diagnosis not present

## 2020-12-08 DIAGNOSIS — I509 Heart failure, unspecified: Secondary | ICD-10-CM | POA: Diagnosis not present

## 2020-12-08 DIAGNOSIS — I5030 Unspecified diastolic (congestive) heart failure: Secondary | ICD-10-CM | POA: Diagnosis not present

## 2020-12-08 DIAGNOSIS — I4811 Longstanding persistent atrial fibrillation: Secondary | ICD-10-CM | POA: Diagnosis not present

## 2020-12-08 DIAGNOSIS — I4819 Other persistent atrial fibrillation: Secondary | ICD-10-CM | POA: Diagnosis not present

## 2020-12-08 DIAGNOSIS — I5032 Chronic diastolic (congestive) heart failure: Secondary | ICD-10-CM | POA: Diagnosis not present

## 2020-12-08 DIAGNOSIS — W19XXXA Unspecified fall, initial encounter: Secondary | ICD-10-CM | POA: Diagnosis not present

## 2020-12-08 DIAGNOSIS — J432 Centrilobular emphysema: Secondary | ICD-10-CM | POA: Diagnosis not present

## 2020-12-08 DIAGNOSIS — Z96652 Presence of left artificial knee joint: Secondary | ICD-10-CM | POA: Diagnosis not present

## 2020-12-08 DIAGNOSIS — M6281 Muscle weakness (generalized): Secondary | ICD-10-CM | POA: Diagnosis not present

## 2020-12-08 LAB — BASIC METABOLIC PANEL WITH GFR
BUN: 18 (ref 4–21)
CO2: 26 — AB (ref 13–22)
Chloride: 105 (ref 99–108)
Creatinine: 0.9 (ref 0.5–1.1)
Glucose: 114
Potassium: 4.1 (ref 3.4–5.3)
Sodium: 139 (ref 137–147)

## 2020-12-08 LAB — CBC AND DIFFERENTIAL
HCT: 33 — AB (ref 36–46)
Hemoglobin: 10.6 — AB (ref 12.0–16.0)
Platelets: 289 (ref 150–399)
WBC: 8.1

## 2020-12-08 LAB — COMPREHENSIVE METABOLIC PANEL WITH GFR
Calcium: 8.8 (ref 8.7–10.7)
GFR calc non Af Amer: 65

## 2020-12-08 LAB — CBC: RBC: 3.38 — AB (ref 3.87–5.11)

## 2020-12-08 NOTE — Telephone Encounter (Signed)
Left voicemail message to call back to confirm her appointment scheduled.

## 2020-12-08 NOTE — Telephone Encounter (Signed)
NP calling from [facility] to schedule patient 1 week follow up appointment. States patient had afib while in hospital. Amiodarone was adjusted, as well as Lasix. She is requesting a 1 week follow up. Please call to discuss.

## 2020-12-08 NOTE — Telephone Encounter (Signed)
Spoke with Ms Teff NP at [facility]. Advised I have appointment for patient on 12/16/20 at 11:20 am. She requested that we call patient to make her aware of this appointment.

## 2020-12-08 NOTE — Telephone Encounter (Signed)
Spoke with patient and confirmed her upcoming appointment with Dr. Rockey Situ next week. She verbalized understanding with no further questions at this time.

## 2020-12-09 DIAGNOSIS — I4811 Longstanding persistent atrial fibrillation: Secondary | ICD-10-CM | POA: Diagnosis not present

## 2020-12-09 DIAGNOSIS — I2510 Atherosclerotic heart disease of native coronary artery without angina pectoris: Secondary | ICD-10-CM | POA: Diagnosis not present

## 2020-12-09 DIAGNOSIS — Z471 Aftercare following joint replacement surgery: Secondary | ICD-10-CM | POA: Diagnosis not present

## 2020-12-09 DIAGNOSIS — I5030 Unspecified diastolic (congestive) heart failure: Secondary | ICD-10-CM | POA: Diagnosis not present

## 2020-12-09 DIAGNOSIS — I10 Essential (primary) hypertension: Secondary | ICD-10-CM | POA: Diagnosis not present

## 2020-12-11 DIAGNOSIS — I10 Essential (primary) hypertension: Secondary | ICD-10-CM | POA: Diagnosis not present

## 2020-12-11 DIAGNOSIS — Z96651 Presence of right artificial knee joint: Secondary | ICD-10-CM | POA: Diagnosis not present

## 2020-12-11 DIAGNOSIS — I4811 Longstanding persistent atrial fibrillation: Secondary | ICD-10-CM | POA: Diagnosis not present

## 2020-12-11 DIAGNOSIS — Z471 Aftercare following joint replacement surgery: Secondary | ICD-10-CM | POA: Diagnosis not present

## 2020-12-11 DIAGNOSIS — I5030 Unspecified diastolic (congestive) heart failure: Secondary | ICD-10-CM | POA: Diagnosis not present

## 2020-12-14 DIAGNOSIS — I4811 Longstanding persistent atrial fibrillation: Secondary | ICD-10-CM | POA: Diagnosis not present

## 2020-12-14 DIAGNOSIS — I10 Essential (primary) hypertension: Secondary | ICD-10-CM | POA: Diagnosis not present

## 2020-12-14 DIAGNOSIS — Z471 Aftercare following joint replacement surgery: Secondary | ICD-10-CM | POA: Diagnosis not present

## 2020-12-14 DIAGNOSIS — W19XXXA Unspecified fall, initial encounter: Secondary | ICD-10-CM | POA: Diagnosis not present

## 2020-12-14 DIAGNOSIS — Z96651 Presence of right artificial knee joint: Secondary | ICD-10-CM | POA: Diagnosis not present

## 2020-12-15 DIAGNOSIS — M25562 Pain in left knee: Secondary | ICD-10-CM | POA: Diagnosis not present

## 2020-12-15 DIAGNOSIS — Z96652 Presence of left artificial knee joint: Secondary | ICD-10-CM | POA: Diagnosis not present

## 2020-12-16 ENCOUNTER — Ambulatory Visit: Payer: Medicare HMO | Admitting: Cardiovascular Disease

## 2020-12-16 ENCOUNTER — Encounter: Payer: Self-pay | Admitting: Cardiovascular Disease

## 2020-12-16 ENCOUNTER — Other Ambulatory Visit: Payer: Self-pay

## 2020-12-16 VITALS — BP 110/70 | HR 80 | Ht 65.0 in | Wt 213.0 lb

## 2020-12-16 DIAGNOSIS — I10 Essential (primary) hypertension: Secondary | ICD-10-CM

## 2020-12-16 DIAGNOSIS — I4819 Other persistent atrial fibrillation: Secondary | ICD-10-CM | POA: Diagnosis not present

## 2020-12-16 DIAGNOSIS — J432 Centrilobular emphysema: Secondary | ICD-10-CM

## 2020-12-16 DIAGNOSIS — I5032 Chronic diastolic (congestive) heart failure: Secondary | ICD-10-CM

## 2020-12-16 DIAGNOSIS — I272 Pulmonary hypertension, unspecified: Secondary | ICD-10-CM

## 2020-12-16 MED ORDER — POTASSIUM CHLORIDE ER 10 MEQ PO TBCR: 10.0000 meq | EXTENDED_RELEASE_TABLET | Freq: Every day | ORAL | 3 refills | Status: DC

## 2020-12-16 MED ORDER — APIXABAN 5 MG PO TABS: 5.0000 mg | ORAL_TABLET | Freq: Two times a day (BID) | ORAL | 3 refills | Status: DC

## 2020-12-16 MED ORDER — FUROSEMIDE 40 MG PO TABS
40.0000 mg | ORAL_TABLET | Freq: Every day | ORAL | 3 refills | Status: DC
Start: 2020-12-16 — End: 2021-08-25

## 2020-12-16 MED ORDER — AMIODARONE HCL 100 MG PO TABS: 200.0000 mg | ORAL_TABLET | Freq: Every day | ORAL | 3 refills | Status: DC

## 2020-12-16 MED ORDER — BISOPROLOL FUMARATE 5 MG PO TABS
5.0000 mg | ORAL_TABLET | Freq: Every day | ORAL | 3 refills | Status: DC
Start: 2020-12-16 — End: 2021-02-08

## 2020-12-16 NOTE — Progress Notes (Signed)
Date:  12/16/2020   ID:  Lauren Lloyd, Lauren Lloyd 1941/04/27, MRN [id number]  Patient Location:  [address] [place] [address]   Provider location:   [facility], [place] offic  PCP:  [name], MD  Cardiologist:  Arvid Right [facility]  Chief Complaint  Patient presents with   Follow up A-Fib     Patient went into A-Fib s/p total left knee replacement on July 20th at [facility]. Patient c/o irregular heartbeats. Medications reviewed by the patient verbally.     History of Present Illness:    Lauren Lloyd is a 80 y.o. female  past medical history of Aortic atherosclerosis on CT scan 2020 hypertension,  asthma  Chronic lower extremity swelling Hospital admission for atrial fibrillation December 2019 Associated acute diastolic CHF Status post successful DCCV on 06/15/2018.  NSR on 06/2018 stress test, 07/2018 Showing no significant ischemia Mild lower extremity swelling, chronic Who presents for hospital follow-up, follow-up of her atrial fibrillation and acute on chronic diastolic CHF  Prior office visit 4/21 some shortness of breath on exertion amio down to 100 daily   Last office visit December 2021 At that time was normal sinus rhythm  Had total knee replacement July 20 at [facility], into atrial fibrillation Reports that she had surgery on Wednesday, was noted to have tachypalpitations consistent with atrial fibrillation 2 days later in recovery Told at [facility] to increase the amiodarone up to 200 mg daily increase bisoprolol up to 5 mg daily  [facility] at [facility]  Still in atrial fibrillation today Reports that she is taking amiodarone 100 daily bisoprolol 2.5 daily Lasix 40 daily Does not feel that she has shortness of breath abdominal distention or leg swelling Some fatigue but wonders if it is from recovering from surgery or from the atrial fibrillation  Followed by Lanney Gins, pulmonary.  Has not seen him recently On and off her  CPAP  History of back pain, has received cortisone injections Followed by Dr. Sharlet Salina  EKG personally reviewed by myself on todays visit Shows atrial fibrillation with ventricular rate of 80 bpm no significant ST-T wave changes  Other past medical history reviewed  Nov 2020, developed atrial fib Started on amiodarone, higher dose metoprolol Lasix increased,40 BID 3 days then back to 40 daily She continued on 40 BID  She presented to the clinic and had stopped her amiodarone  She was still in normal sinus rhythm but she had worsening shortness of breath  She reported very high fluid intake  REDS vest 40% on 04/16/2019, goal less than 36  stress test, 07/2018 Showing no significant ischemia normal ejection fraction normal perfusion  Presented to the hospital 04/2018 with worsening shortness of breath, leg swelling, weight gain, abdominal bloating and new atrial fibrillation with RVR Initially on Cardizem infusion, changed to oral Cardizem with titration upwards Discharged on Cardizem CD 300 mg and metoprolol 25 mg bid, Eliquis. Treated with IV Lasix during her hospital course with marked improvement in her shortness of breath   Carotid ultrasound January 2020 Less than 39% disease bilaterally   Past Medical History:  Diagnosis Date   (HFpEF) heart failure with preserved ejection fraction ([place])    a. 05/2018 Echo: EF 55-60%, no rwma, mild to mod MR. Nl RV fxn. Mod TR. PASP 64mHg.   Arthritis    knees, Hands   Arthritis of knee    Back pain    Carotid arterial disease ([place]    a. 03/2019  Carotid U/S: <50% bilat ICA stenoses.   Cholelithiasis    a. 10/2018 noted on CT.   Edema, lower extremity    Fatty liver    GERD (gastroesophageal reflux disease)    History of stress test    a. 06/2018 MV: EF 59%, no ischemia/infarct. Low risk.   Knee pain    Lactose intolerance    Mitral regurgitation    a. 05/2018 Echo: mild to mod MR.   Multinodular goiter    Obesity    OSA  (obstructive sleep apnea)    PAF (paroxysmal atrial fibrillation) ([place])    a.  Diagnosed 12/19; b. 05/2018 s/p DCCV; c. 03/2019 & 05/2019 recurrent AFib-->managed w/ amio load; d. CHADS2VASc = 6 (CHF, HTN, age x 2, vascular disease, female)-->Eliquis & amio 100 qd.   PAH (pulmonary artery hypertension) (HCC)    Scoliosis    SOB (shortness of breath)    Swallowing difficulty    Past Surgical History:  Procedure Laterality Date   CARDIOVERSION N/A 06/15/2018   Procedure: CARDIOVERSION (CATH LAB);  Surgeon: Minna Merritts, MD;  Location: [facility] ORS;  Service: Cardiovascular;  Laterality: N/A;   CATARACT EXTRACTION W/PHACO Right 01/25/2016   Procedure: CATARACT EXTRACTION PHACO AND INTRAOCULAR LENS PLACEMENT ([place]);  Surgeon: Ronnell Freshwater, MD;  Location: [place];  Service: Ophthalmology;  Laterality: Right;  RIGHT   CATARACT EXTRACTION W/PHACO Left 02/22/2016   Procedure: CATARACT EXTRACTION PHACO AND INTRAOCULAR LENS PLACEMENT ([place]);  Surgeon: Ronnell Freshwater, MD;  Location: [place];  Service: Ophthalmology;  Laterality: Left;  LEFT   HAMMER TOE SURGERY  2010   KNEE ARTHROSCOPY Right 2004   REPLACEMENT TOTAL KNEE Right 2009   [facility]   SKIN GRAFT Left 04/08/2013   Done on left index finger   TONSILLECTOMY  1946     Current Meds  Medication Sig   acetaminophen (TYLENOL) 500 MG tablet Take 500 mg by mouth every 6 (six) hours as needed.   amiodarone (PACERONE) 100 MG tablet Take 200 mg by mouth daily.   bisoprolol (ZEBETA) 5 MG tablet TAKE ONE-HALF (1/2) TABLET BY MOUTH TWICE DAILY   calcium carbonate (TUMS EX) 750 MG chewable tablet Chew by mouth.   celecoxib (CELEBREX) 200 MG capsule Take by mouth daily.   Cholecalciferol 25 MCG (1000 UT) tablet Take by mouth.   cyanocobalamin 1000 MCG tablet Take by mouth.   ELIQUIS 5 MG TABS tablet TAKE ONE TABLET BY MOUTH TWICE DAILY   furosemide (LASIX) 40 MG tablet Take 1 tablet (40 mg  total) by mouth 2 (two) times daily. Take twice a day anytime weight is 218 or higher.   loperamide (IMODIUM) 2 MG capsule Take 2 mg by mouth as needed.    melatonin 3 MG TABS tablet Take 3 mg by mouth at bedtime.   metaxalone (SKELAXIN) 800 MG tablet Take 800 mg by mouth 3 (three) times daily.    Multiple Vitamin (MULTIVITAMIN) capsule Take 1 capsule by mouth daily.   omeprazole (PRILOSEC) 20 MG capsule Take 1 capsule (20 mg total) by mouth daily.   potassium chloride (KLOR-CON) 10 MEQ tablet TAKE 1 TABLET BY MOUTH DAILY   traMADol (ULTRAM) 50 MG tablet Take 1 tablet (50 mg total) by mouth every 6 (six) hours as needed.   VITAMIN D PO Take by mouth.     Allergies:   Levofloxacin, Influenza vaccines, and Oysters [shellfish allergy]   Social History   Tobacco Use   Smoking  status: Former    Packs/day: 1.00    Years: 30.00    Pack years: 30.00    Types: Cigarettes    Quit date: 05/16/1989    Years since quitting: 31.6   Smokeless tobacco: Never  Vaping Use   Vaping Use: Never used  Substance Use Topics   Alcohol use: Yes    Alcohol/week: 7.0 standard drinks    Types: 7 Glasses of wine per week    Comment: 0-2 a night   Drug use: No     Family Hx: [name] family history includes Atrial fibrillation in her sister and sister; Breast cancer (age of onset: 66) in her sister; Healthy in her brother; Heart attack in her father; Heart disease in her mother; High blood pressure in her father; Hyperlipidemia in her sister and sister; Stroke in her father; Transient ischemic attack in her mother.  ROS:   Please see the history of present illness.    Review of Systems  Constitutional: Negative.   HENT: Negative.    Respiratory:  Positive for shortness of breath.   Cardiovascular: Negative.   Gastrointestinal: Negative.   Musculoskeletal:  Positive for joint pain.  Neurological: Negative.   Psychiatric/Behavioral: Negative.    All other systems reviewed and are negative.    Labs/Other Tests and Data Reviewed:    Recent Labs: 08/03/2020: TSH 1.020 09/24/2020: ALT 14; BUN 21; Creatinine, Ser 0.89; Hemoglobin 12.8; Platelets 278; Potassium 3.9; Sodium 136   Recent Lipid Panel Lab Results  Component Value Date/Time   CHOL 289 (H) 08/03/2020 10:12 AM   TRIG 193 (H) 08/03/2020 10:12 AM   HDL 65 08/03/2020 10:12 AM   CHOLHDL 4.4 08/03/2020 10:12 AM   CHOLHDL 3.6 05/31/2018 04:52 AM   LDLCALC 188 (H) 08/03/2020 10:12 AM    Wt Readings from Last 3 Encounters:  12/16/20 213 lb (96.6 kg)  09/24/20 204 lb (92.5 kg)  08/03/20 218 lb (98.9 kg)     Exam:    BP 110/70 (BP Location: Left Arm, Patient Position: Sitting, Cuff Size: Normal)   Pulse 80   Ht '5\' 5"'$  (1.651 m)   Wt 213 lb (96.6 kg)   SpO2 94%   BMI 35.45 kg/m  Constitutional:  oriented to person, place, and time. No distress.  HENT:  Head: Grossly normal Eyes:  no discharge. No scleral icterus.  Neck: No JVD, no carotid bruits  Cardiovascular: Regular rate and rhythm, no murmurs appreciated Pulmonary/Chest: Clear to auscultation bilaterally, no wheezes or rails Abdominal: Soft.  no distension.  no tenderness.  Musculoskeletal: Normal range of motion Neurological:  normal muscle tone. Coordination normal. No atrophy Skin: Skin warm and dry Psychiatric: normal affect, pleasant   ASSESSMENT & PLAN:    Paroxysmal atrial fibrillation (HCC) -  Back into atrial fibrillation Was off Eliquis 4 days prior to procedure/total knee replacement per the patient Unclear when Eliquis was started, she reports it was post procedure She is unclear when atrial fibrillation started but had symptoms 2 days following surgery Currently relatively asymptomatic She is only taking amiodarone 100 daily bisoprolol 2.5 daily We recommend she increase bisoprolol up to 5 mg daily even with extra two-point 5 in the evening if rate runs over 90 Will wait additional 3 weeks of anticoagulation before increasing amiodarone  in effort to converted to normal sinus rhythm -Recommend we meet again in the clinic in 3 weeks time  Chronic diastolic CHF (congestive heart failure) ([place]) Reports she is compliant with Lasix 40 daily Recommended  extra Lasix 40 mg after lunch for worsening leg swelling abdominal distention or shortness of breath  Leg edema We will avoid calcium channel blockers Lasix 40 daily, extra Lasix after lunch for worsening ankle swelling   Centrilobular emphysema (HCC)  Prior history of smoking Followed by pulmonary, Denies any recent exacerbations  Pulmonary hypertension, unspecified (HCC) Moderate pulmonary hypertension seen on prior echocardiogram Continue Lasix 40 daily Extra Lasix as detailed above Symptoms may be exacerbated by recurrence of her atrial fibrillation  Back pain, knee pain Weight loss, walking program  Records requested from [facility], reviewed Long discussion concerning management of her atrial fibrillation  Total encounter time more than 35 minutes  Greater than 50% was spent in counseling and coordination of care with the patient    Signed, Ida Rogue, MD  12/16/2020 12:21 PM    [facility] [id number], [place], [place] [address]

## 2020-12-16 NOTE — Patient Instructions (Addendum)
Medication Instructions:  Lasix 40 daily extra 40 mg as needed after lunch  abdominal swelling leg swelling shortness of breath  Please monitor heart rate If HR running >90, we will need to increase bisoprolol Take at least 5 mg a day (Bisoprolol) May need extra 2.5 up to 5 mg in the PM  If you need a refill on your cardiac medications before your next appointment, please call your pharmacy.    Lab work: No new labs needed  Testing/Procedures: No new testing needed   Follow-Up: At [facility], you and your health needs are our priority.  As part of our continuing mission to provide you with exceptional heart care, we have created designated Provider Care Teams.  These Care Teams include your primary Cardiologist (physician) and Advanced Practice Providers (APPs -  Physician Assistants and Nurse Practitioners) who all work together to provide you with the care you need, when you need it.  You will need a follow up appointment in 3 -4 weeks  Providers on your designated Care Team:   Murray Hodgkins, NP Christell Faith, PA-C Marrianne Mood, PA-C [place], [place]  COVID-19 Vaccine Information can be found at: ShippingScam.co.uk For questions related to vaccine distribution or appointments, please email [email or web address] or call [phone].

## 2020-12-17 DIAGNOSIS — Z471 Aftercare following joint replacement surgery: Secondary | ICD-10-CM | POA: Diagnosis not present

## 2020-12-17 DIAGNOSIS — I4811 Longstanding persistent atrial fibrillation: Secondary | ICD-10-CM | POA: Diagnosis not present

## 2020-12-17 DIAGNOSIS — Z96651 Presence of right artificial knee joint: Secondary | ICD-10-CM | POA: Diagnosis not present

## 2020-12-19 DIAGNOSIS — I4811 Longstanding persistent atrial fibrillation: Secondary | ICD-10-CM | POA: Diagnosis not present

## 2020-12-21 DIAGNOSIS — I4811 Longstanding persistent atrial fibrillation: Secondary | ICD-10-CM | POA: Diagnosis not present

## 2020-12-21 DIAGNOSIS — M419 Scoliosis, unspecified: Secondary | ICD-10-CM | POA: Diagnosis not present

## 2020-12-21 DIAGNOSIS — E669 Obesity, unspecified: Secondary | ICD-10-CM | POA: Diagnosis not present

## 2020-12-21 DIAGNOSIS — Z87891 Personal history of nicotine dependence: Secondary | ICD-10-CM | POA: Diagnosis not present

## 2020-12-21 DIAGNOSIS — E559 Vitamin D deficiency, unspecified: Secondary | ICD-10-CM | POA: Diagnosis not present

## 2020-12-21 DIAGNOSIS — I5033 Acute on chronic diastolic (congestive) heart failure: Secondary | ICD-10-CM | POA: Diagnosis not present

## 2020-12-21 DIAGNOSIS — E7800 Pure hypercholesterolemia, unspecified: Secondary | ICD-10-CM | POA: Diagnosis not present

## 2020-12-21 DIAGNOSIS — I110 Hypertensive heart disease with heart failure: Secondary | ICD-10-CM | POA: Diagnosis not present

## 2020-12-21 DIAGNOSIS — R41841 Cognitive communication deficit: Secondary | ICD-10-CM | POA: Diagnosis not present

## 2020-12-21 DIAGNOSIS — I779 Disorder of arteries and arterioles, unspecified: Secondary | ICD-10-CM | POA: Diagnosis not present

## 2020-12-21 DIAGNOSIS — E042 Nontoxic multinodular goiter: Secondary | ICD-10-CM | POA: Diagnosis not present

## 2020-12-21 DIAGNOSIS — I051 Rheumatic mitral insufficiency: Secondary | ICD-10-CM | POA: Diagnosis not present

## 2020-12-21 DIAGNOSIS — K760 Fatty (change of) liver, not elsewhere classified: Secondary | ICD-10-CM | POA: Diagnosis not present

## 2020-12-21 DIAGNOSIS — G4733 Obstructive sleep apnea (adult) (pediatric): Secondary | ICD-10-CM | POA: Diagnosis not present

## 2020-12-21 DIAGNOSIS — K8020 Calculus of gallbladder without cholecystitis without obstruction: Secondary | ICD-10-CM | POA: Diagnosis not present

## 2020-12-21 DIAGNOSIS — I272 Pulmonary hypertension, unspecified: Secondary | ICD-10-CM | POA: Diagnosis not present

## 2020-12-21 DIAGNOSIS — K589 Irritable bowel syndrome without diarrhea: Secondary | ICD-10-CM | POA: Diagnosis not present

## 2020-12-21 DIAGNOSIS — J45909 Unspecified asthma, uncomplicated: Secondary | ICD-10-CM | POA: Diagnosis not present

## 2020-12-21 DIAGNOSIS — R1310 Dysphagia, unspecified: Secondary | ICD-10-CM | POA: Diagnosis not present

## 2020-12-21 DIAGNOSIS — E739 Lactose intolerance, unspecified: Secondary | ICD-10-CM | POA: Diagnosis not present

## 2020-12-21 DIAGNOSIS — Z471 Aftercare following joint replacement surgery: Secondary | ICD-10-CM | POA: Diagnosis not present

## 2020-12-21 DIAGNOSIS — M150 Primary generalized (osteo)arthritis: Secondary | ICD-10-CM | POA: Diagnosis not present

## 2020-12-21 DIAGNOSIS — K219 Gastro-esophageal reflux disease without esophagitis: Secondary | ICD-10-CM | POA: Diagnosis not present

## 2020-12-21 DIAGNOSIS — I700 Atherosclerosis of aorta: Secondary | ICD-10-CM | POA: Diagnosis not present

## 2020-12-24 ENCOUNTER — Telehealth: Payer: Self-pay

## 2020-12-24 NOTE — Progress Notes (Signed)
Chronic Care Management Pharmacy Assistant   Name: Lauren Lloyd  MRN: [id number] DOB: 1940/09/16  Reason for Encounter: Hypertension Disease State Call/Schedule patient with CPP   Recent office visits:  08/03/2020 Lavon Paganini, MD (PCP Office Visit) for Hypertension & HLD- No medication changes noted; labs ordered; 3D Screen Breast Ordered  Recent consult visits:  12/16/2020 Ida Rogue, MD (Cardiology)- for Follow-up A-Fib- Amiodarone HCl increased to 200 mg Daily; Bisoprolol Fumarate 5 mg per note patient may take an extra 2.5 up to 5 mg in the pm for HR;   12/15/2020 Fransisca Connors, PA (Orthopedic) for Post Op Visit- No medication changes noted- patient instructed to follow-up in 4 weeks.  11/10/2020 Fransisca Connors, PA (Orthopedic) Unable to view note  11/05/2020 Juanda Crumble Chasnis, DO (Physical Rehab)- Follow-up- No medication changes noted; patient instructed to return in 2 months  08/03/2020 [place], DO (Physical Rehab)- Started on Diazepam 5 mg tablet 1-2 before procedure; Gabapentin 300 mg daily was discontinued due to patient not taking; patient instructed to follow-up in 5 weeks  Hospital visits:  Medication Reconciliation was completed by comparing discharge summary, patient's EMR and Pharmacy list, and upon discussion with patient.  Admitted to the hospital on 09/24/2020 due to Numbness. Discharge date was 09/24/2020. Discharged from [facility] Emergency Department.    New?Medications Started at [facility] Discharge:??  -predniSONE 10 MG tablet Take 1 tablet (10 mg total) by mouth 2 (two) times daily with a meal for 7 days, THEN 2 tablets (20 mg total) daily with breakfast for 3 days, THEN 1 tablet (10 mg total) daily with breakfast for 3 days.  traMADol 50 MG tablet Take 1 tablet (50 mg total) by mouth every 6 (six) hours as needed.  Medication Changes at Hospital Discharge: -Changed None ID  Medications  Discontinued at Hospital Discharge: -Stopped None ID  Medications that remain the same after Hospital Discharge:??  -All other medications will remain the same.    Medication Reconciliation was completed by comparing discharge summary, patient's EMR and Pharmacy list, and upon discussion with patient.  Admitted to the hospital on 12/02/2020 due to Persistent Atrial Fibrillation. Discharge date was 12/08/2020. Discharged from [place] and Trauma Stepdown  New?Medications Started at [facility] Discharge:??  START taking these medications  Details acetaminophen (TYLENOL) 500 MG tablet Take 2 tablets (1,000 mg total) by mouth every 8 (eight) hours for 14 days  calcium carbonate (TUMS E-X) 300 mg (750 mg) chewable tablet Take 1 tablet (750 mg of salt total) by mouth 3 (three) times daily as needed for Heartburn  melatonin 3 mg tablet Take 2 tablets (6 mg total) by mouth nightly  oxyCODONE (ROXICODONE) 5 MG immediate release tablet Take 0.5-1 tablets (2.5-5 mg total) by mouth every 4 (four) hours as needed for Pain for up to 7 days 2.5 mg for pain 4-6/10, 5 mg for pain 7-10/10, on 0-10 pain scale. Please decrease your narcotic use over the next week  polyethylene glycol (MIRALAX) packet Take 1 packet (17 g total) by mouth once daily as needed for Constipation for up to 7 days Mix in 4-8ounces of fluid prior to taking.  sennosides-docusate (SENOKOT-S) 8.6-50 mg tablet Take 1 tablet by mouth 2 (two) times daily for 7 days To prevent constipation with narcotic pain medication use. Hold for loose stools.  Medication Changes at Hospital Discharge: -Changed None ID  Medications Discontinued at Hospital Discharge: -Stopped None ID  Medications that remain the  same after Hospital Discharge:??  -All other medications will remain the same.    Medications: Outpatient Encounter Medications as of 12/24/2020  Medication Sig   amiodarone (PACERONE) 100 MG  tablet Take 2 tablets (200 mg total) by mouth daily.   apixaban (ELIQUIS) 5 MG TABS tablet Take 1 tablet (5 mg total) by mouth 2 (two) times daily.   bisoprolol (ZEBETA) 5 MG tablet Take 1 tablet (5 mg total) by mouth daily. May take extra 2.5 up to 5 mg in the pm for HR >90   calcium carbonate (TUMS EX) 750 MG chewable tablet Chew by mouth.   celecoxib (CELEBREX) 200 MG capsule Take by mouth daily.   Cholecalciferol 25 MCG (1000 UT) tablet Take by mouth.   cyanocobalamin 1000 MCG tablet Take by mouth.   furosemide (LASIX) 40 MG tablet Take 1 tablet (40 mg total) by mouth daily. Take extra 40 mg as needed after lunch for abdominal swelling, leg swelling, or shortness of breath   gabapentin (NEURONTIN) 300 MG capsule TAKE ONE CAPSULE BY MOUTH AT BEDTIME (Patient not taking: Reported on 12/16/2020)   loperamide (IMODIUM) 2 MG capsule Take 2 mg by mouth as needed.    melatonin 3 MG TABS tablet Take 3 mg by mouth at bedtime.   metaxalone (SKELAXIN) 800 MG tablet Take 800 mg by mouth 3 (three) times daily.    Multiple Vitamin (MULTIVITAMIN) capsule Take 1 capsule by mouth daily.   omeprazole (PRILOSEC) 20 MG capsule Take 1 capsule (20 mg total) by mouth daily.   potassium chloride (KLOR-CON) 10 MEQ tablet Take 1 tablet (10 mEq total) by mouth daily.   traMADol (ULTRAM) 50 MG tablet Take 1 tablet (50 mg total) by mouth every 6 (six) hours as needed.   VITAMIN D PO Take by mouth.   No facility-administered encounter medications on file as of 12/24/2020.   Care Gaps: Zoster Vaccines- Shingrix INFLUENZA VACCINE  Star Rating Drugs: None ID  Reviewed chart prior to disease state call. Spoke with patient regarding BP  Recent Office Vitals: BP Readings from Last 3 Encounters:  12/16/20 110/70  09/24/20 (!) 130/52  08/03/20 138/68   Pulse Readings from Last 3 Encounters:  12/16/20 80  09/24/20 60  08/03/20 (!) 48    Wt Readings from Last 3 Encounters:  12/16/20 213 lb (96.6 kg)  09/24/20 204  lb (92.5 kg)  08/03/20 218 lb (98.9 kg)     Kidney Function Lab Results  Component Value Date/Time   CREATININE 0.89 09/24/2020 09:49 AM   CREATININE 0.99 08/03/2020 10:12 AM   CREATININE 0.78 01/19/2017 03:22 PM   GFRNONAA >60 09/24/2020 09:49 AM   GFRNONAA 73 01/19/2017 03:22 PM   GFRAA 75 12/09/2019 02:11 PM   GFRAA 85 01/19/2017 03:22 PM    BMP Latest Ref Rng & Units 09/24/2020 08/03/2020 12/09/2019  Glucose 70 - 99 mg/dL 127(H) 98 94  BUN 8 - 23 mg/dL '21 20 20  '$ Creatinine 0.44 - 1.00 mg/dL 0.89 0.99 0.85  BUN/Creat Ratio 12 - 28 - 20 24  Sodium 135 - 145 mmol/L 136 144 143  Potassium 3.5 - 5.1 mmol/L 3.9 4.6 4.7  Chloride 98 - 111 mmol/L 102 103 103  CO2 22 - 32 mmol/L 21(L) 26 23  Calcium 8.9 - 10.3 mg/dL 9.0 9.5 9.3    Current antihypertensive regimen:  Lasix 40 mg Daily for pulmonary hypertension Bisoprolol 5 mg take 1 tablet daily may take an extra 2.5 up to 5 as needed for  HR  What recent interventions/DTPs have been made by any provider to improve Blood Pressure control since last CPP Visit: Bisoprolol increased on prn status  Any recent hospitalizations or ED visits since last visit with CPP? Yes   Adherence Review: Is the patient currently on ACE/ARB medication? No Does the patient have >5 day gap between last estimated fill dates? No  AWV scheduled for 09/26 @ 0820  08/15- reached out to patient and although she answered she did request a call back she stated that she was doing Physical Therapy at the moment as she just had knee surgery.  08/18- reached out to the patient and she stated that she was in the middle of taking care of something with her insurance and requested a call back.  08/22 I have attempted without success to contact this patient by phone three times to complete her monthly disease state call, and to get her scheduled with Junius Argyle, CPP. I have left voice messages requesting this patient to return my calls.  Lynann Bologna,  CPA/CMA Clinical Pharmacist Assistant Phone: [phone]

## 2020-12-25 DIAGNOSIS — R1310 Dysphagia, unspecified: Secondary | ICD-10-CM | POA: Diagnosis not present

## 2020-12-25 DIAGNOSIS — E669 Obesity, unspecified: Secondary | ICD-10-CM | POA: Diagnosis not present

## 2020-12-25 DIAGNOSIS — Z471 Aftercare following joint replacement surgery: Secondary | ICD-10-CM | POA: Diagnosis not present

## 2020-12-25 DIAGNOSIS — G4733 Obstructive sleep apnea (adult) (pediatric): Secondary | ICD-10-CM | POA: Diagnosis not present

## 2020-12-25 DIAGNOSIS — R41841 Cognitive communication deficit: Secondary | ICD-10-CM | POA: Diagnosis not present

## 2020-12-25 DIAGNOSIS — I4811 Longstanding persistent atrial fibrillation: Secondary | ICD-10-CM | POA: Diagnosis not present

## 2020-12-25 DIAGNOSIS — E042 Nontoxic multinodular goiter: Secondary | ICD-10-CM | POA: Diagnosis not present

## 2020-12-25 DIAGNOSIS — K589 Irritable bowel syndrome without diarrhea: Secondary | ICD-10-CM | POA: Diagnosis not present

## 2020-12-25 DIAGNOSIS — M419 Scoliosis, unspecified: Secondary | ICD-10-CM | POA: Diagnosis not present

## 2020-12-25 DIAGNOSIS — K8020 Calculus of gallbladder without cholecystitis without obstruction: Secondary | ICD-10-CM | POA: Diagnosis not present

## 2020-12-25 DIAGNOSIS — E559 Vitamin D deficiency, unspecified: Secondary | ICD-10-CM | POA: Diagnosis not present

## 2020-12-25 DIAGNOSIS — I779 Disorder of arteries and arterioles, unspecified: Secondary | ICD-10-CM | POA: Diagnosis not present

## 2020-12-25 DIAGNOSIS — M150 Primary generalized (osteo)arthritis: Secondary | ICD-10-CM | POA: Diagnosis not present

## 2020-12-25 DIAGNOSIS — I700 Atherosclerosis of aorta: Secondary | ICD-10-CM | POA: Diagnosis not present

## 2020-12-25 DIAGNOSIS — K219 Gastro-esophageal reflux disease without esophagitis: Secondary | ICD-10-CM | POA: Diagnosis not present

## 2020-12-25 DIAGNOSIS — I5033 Acute on chronic diastolic (congestive) heart failure: Secondary | ICD-10-CM | POA: Diagnosis not present

## 2020-12-25 DIAGNOSIS — E7800 Pure hypercholesterolemia, unspecified: Secondary | ICD-10-CM | POA: Diagnosis not present

## 2020-12-25 DIAGNOSIS — I272 Pulmonary hypertension, unspecified: Secondary | ICD-10-CM | POA: Diagnosis not present

## 2020-12-25 DIAGNOSIS — J45909 Unspecified asthma, uncomplicated: Secondary | ICD-10-CM | POA: Diagnosis not present

## 2020-12-25 DIAGNOSIS — Z87891 Personal history of nicotine dependence: Secondary | ICD-10-CM | POA: Diagnosis not present

## 2020-12-25 DIAGNOSIS — K760 Fatty (change of) liver, not elsewhere classified: Secondary | ICD-10-CM | POA: Diagnosis not present

## 2020-12-25 DIAGNOSIS — E739 Lactose intolerance, unspecified: Secondary | ICD-10-CM | POA: Diagnosis not present

## 2020-12-25 DIAGNOSIS — I110 Hypertensive heart disease with heart failure: Secondary | ICD-10-CM | POA: Diagnosis not present

## 2020-12-25 DIAGNOSIS — I051 Rheumatic mitral insufficiency: Secondary | ICD-10-CM | POA: Diagnosis not present

## 2020-12-28 DIAGNOSIS — Z471 Aftercare following joint replacement surgery: Secondary | ICD-10-CM | POA: Diagnosis not present

## 2020-12-28 DIAGNOSIS — E669 Obesity, unspecified: Secondary | ICD-10-CM | POA: Diagnosis not present

## 2020-12-28 DIAGNOSIS — I051 Rheumatic mitral insufficiency: Secondary | ICD-10-CM | POA: Diagnosis not present

## 2020-12-28 DIAGNOSIS — K219 Gastro-esophageal reflux disease without esophagitis: Secondary | ICD-10-CM | POA: Diagnosis not present

## 2020-12-28 DIAGNOSIS — M419 Scoliosis, unspecified: Secondary | ICD-10-CM | POA: Diagnosis not present

## 2020-12-28 DIAGNOSIS — I5033 Acute on chronic diastolic (congestive) heart failure: Secondary | ICD-10-CM | POA: Diagnosis not present

## 2020-12-28 DIAGNOSIS — K8020 Calculus of gallbladder without cholecystitis without obstruction: Secondary | ICD-10-CM | POA: Diagnosis not present

## 2020-12-28 DIAGNOSIS — Z87891 Personal history of nicotine dependence: Secondary | ICD-10-CM | POA: Diagnosis not present

## 2020-12-28 DIAGNOSIS — R41841 Cognitive communication deficit: Secondary | ICD-10-CM | POA: Diagnosis not present

## 2020-12-28 DIAGNOSIS — E042 Nontoxic multinodular goiter: Secondary | ICD-10-CM | POA: Diagnosis not present

## 2020-12-28 DIAGNOSIS — I700 Atherosclerosis of aorta: Secondary | ICD-10-CM | POA: Diagnosis not present

## 2020-12-28 DIAGNOSIS — E739 Lactose intolerance, unspecified: Secondary | ICD-10-CM | POA: Diagnosis not present

## 2020-12-28 DIAGNOSIS — G4733 Obstructive sleep apnea (adult) (pediatric): Secondary | ICD-10-CM | POA: Diagnosis not present

## 2020-12-28 DIAGNOSIS — I4811 Longstanding persistent atrial fibrillation: Secondary | ICD-10-CM | POA: Diagnosis not present

## 2020-12-28 DIAGNOSIS — I110 Hypertensive heart disease with heart failure: Secondary | ICD-10-CM | POA: Diagnosis not present

## 2020-12-28 DIAGNOSIS — K760 Fatty (change of) liver, not elsewhere classified: Secondary | ICD-10-CM | POA: Diagnosis not present

## 2020-12-28 DIAGNOSIS — I779 Disorder of arteries and arterioles, unspecified: Secondary | ICD-10-CM | POA: Diagnosis not present

## 2020-12-28 DIAGNOSIS — R1310 Dysphagia, unspecified: Secondary | ICD-10-CM | POA: Diagnosis not present

## 2020-12-28 DIAGNOSIS — J45909 Unspecified asthma, uncomplicated: Secondary | ICD-10-CM | POA: Diagnosis not present

## 2020-12-28 DIAGNOSIS — K589 Irritable bowel syndrome without diarrhea: Secondary | ICD-10-CM | POA: Diagnosis not present

## 2020-12-28 DIAGNOSIS — M150 Primary generalized (osteo)arthritis: Secondary | ICD-10-CM | POA: Diagnosis not present

## 2020-12-28 DIAGNOSIS — E559 Vitamin D deficiency, unspecified: Secondary | ICD-10-CM | POA: Diagnosis not present

## 2020-12-28 DIAGNOSIS — E7800 Pure hypercholesterolemia, unspecified: Secondary | ICD-10-CM | POA: Diagnosis not present

## 2020-12-28 DIAGNOSIS — I272 Pulmonary hypertension, unspecified: Secondary | ICD-10-CM | POA: Diagnosis not present

## 2020-12-31 DIAGNOSIS — I779 Disorder of arteries and arterioles, unspecified: Secondary | ICD-10-CM | POA: Diagnosis not present

## 2020-12-31 DIAGNOSIS — I272 Pulmonary hypertension, unspecified: Secondary | ICD-10-CM | POA: Diagnosis not present

## 2020-12-31 DIAGNOSIS — Z471 Aftercare following joint replacement surgery: Secondary | ICD-10-CM | POA: Diagnosis not present

## 2020-12-31 DIAGNOSIS — G4733 Obstructive sleep apnea (adult) (pediatric): Secondary | ICD-10-CM | POA: Diagnosis not present

## 2020-12-31 DIAGNOSIS — R1310 Dysphagia, unspecified: Secondary | ICD-10-CM | POA: Diagnosis not present

## 2020-12-31 DIAGNOSIS — K219 Gastro-esophageal reflux disease without esophagitis: Secondary | ICD-10-CM | POA: Diagnosis not present

## 2020-12-31 DIAGNOSIS — K8020 Calculus of gallbladder without cholecystitis without obstruction: Secondary | ICD-10-CM | POA: Diagnosis not present

## 2020-12-31 DIAGNOSIS — E669 Obesity, unspecified: Secondary | ICD-10-CM | POA: Diagnosis not present

## 2020-12-31 DIAGNOSIS — K760 Fatty (change of) liver, not elsewhere classified: Secondary | ICD-10-CM | POA: Diagnosis not present

## 2020-12-31 DIAGNOSIS — E042 Nontoxic multinodular goiter: Secondary | ICD-10-CM | POA: Diagnosis not present

## 2020-12-31 DIAGNOSIS — I4811 Longstanding persistent atrial fibrillation: Secondary | ICD-10-CM | POA: Diagnosis not present

## 2020-12-31 DIAGNOSIS — I051 Rheumatic mitral insufficiency: Secondary | ICD-10-CM | POA: Diagnosis not present

## 2020-12-31 DIAGNOSIS — E7800 Pure hypercholesterolemia, unspecified: Secondary | ICD-10-CM | POA: Diagnosis not present

## 2020-12-31 DIAGNOSIS — I110 Hypertensive heart disease with heart failure: Secondary | ICD-10-CM | POA: Diagnosis not present

## 2020-12-31 DIAGNOSIS — E739 Lactose intolerance, unspecified: Secondary | ICD-10-CM | POA: Diagnosis not present

## 2020-12-31 DIAGNOSIS — Z87891 Personal history of nicotine dependence: Secondary | ICD-10-CM | POA: Diagnosis not present

## 2020-12-31 DIAGNOSIS — I700 Atherosclerosis of aorta: Secondary | ICD-10-CM | POA: Diagnosis not present

## 2020-12-31 DIAGNOSIS — E559 Vitamin D deficiency, unspecified: Secondary | ICD-10-CM | POA: Diagnosis not present

## 2020-12-31 DIAGNOSIS — M150 Primary generalized (osteo)arthritis: Secondary | ICD-10-CM | POA: Diagnosis not present

## 2020-12-31 DIAGNOSIS — R41841 Cognitive communication deficit: Secondary | ICD-10-CM | POA: Diagnosis not present

## 2020-12-31 DIAGNOSIS — J45909 Unspecified asthma, uncomplicated: Secondary | ICD-10-CM | POA: Diagnosis not present

## 2020-12-31 DIAGNOSIS — K589 Irritable bowel syndrome without diarrhea: Secondary | ICD-10-CM | POA: Diagnosis not present

## 2020-12-31 DIAGNOSIS — M419 Scoliosis, unspecified: Secondary | ICD-10-CM | POA: Diagnosis not present

## 2020-12-31 DIAGNOSIS — I5033 Acute on chronic diastolic (congestive) heart failure: Secondary | ICD-10-CM | POA: Diagnosis not present

## 2021-01-04 DIAGNOSIS — R41841 Cognitive communication deficit: Secondary | ICD-10-CM | POA: Diagnosis not present

## 2021-01-04 DIAGNOSIS — E669 Obesity, unspecified: Secondary | ICD-10-CM | POA: Diagnosis not present

## 2021-01-04 DIAGNOSIS — Z87891 Personal history of nicotine dependence: Secondary | ICD-10-CM | POA: Diagnosis not present

## 2021-01-04 DIAGNOSIS — R1310 Dysphagia, unspecified: Secondary | ICD-10-CM | POA: Diagnosis not present

## 2021-01-04 DIAGNOSIS — I779 Disorder of arteries and arterioles, unspecified: Secondary | ICD-10-CM | POA: Diagnosis not present

## 2021-01-04 DIAGNOSIS — I4811 Longstanding persistent atrial fibrillation: Secondary | ICD-10-CM | POA: Diagnosis not present

## 2021-01-04 DIAGNOSIS — Z471 Aftercare following joint replacement surgery: Secondary | ICD-10-CM | POA: Diagnosis not present

## 2021-01-04 DIAGNOSIS — I110 Hypertensive heart disease with heart failure: Secondary | ICD-10-CM | POA: Diagnosis not present

## 2021-01-04 DIAGNOSIS — K589 Irritable bowel syndrome without diarrhea: Secondary | ICD-10-CM | POA: Diagnosis not present

## 2021-01-04 DIAGNOSIS — I700 Atherosclerosis of aorta: Secondary | ICD-10-CM | POA: Diagnosis not present

## 2021-01-04 DIAGNOSIS — E739 Lactose intolerance, unspecified: Secondary | ICD-10-CM | POA: Diagnosis not present

## 2021-01-04 DIAGNOSIS — M150 Primary generalized (osteo)arthritis: Secondary | ICD-10-CM | POA: Diagnosis not present

## 2021-01-04 DIAGNOSIS — K219 Gastro-esophageal reflux disease without esophagitis: Secondary | ICD-10-CM | POA: Diagnosis not present

## 2021-01-04 DIAGNOSIS — E559 Vitamin D deficiency, unspecified: Secondary | ICD-10-CM | POA: Diagnosis not present

## 2021-01-04 DIAGNOSIS — I051 Rheumatic mitral insufficiency: Secondary | ICD-10-CM | POA: Diagnosis not present

## 2021-01-04 DIAGNOSIS — I5033 Acute on chronic diastolic (congestive) heart failure: Secondary | ICD-10-CM | POA: Diagnosis not present

## 2021-01-04 DIAGNOSIS — K8020 Calculus of gallbladder without cholecystitis without obstruction: Secondary | ICD-10-CM | POA: Diagnosis not present

## 2021-01-04 DIAGNOSIS — E042 Nontoxic multinodular goiter: Secondary | ICD-10-CM | POA: Diagnosis not present

## 2021-01-04 DIAGNOSIS — J45909 Unspecified asthma, uncomplicated: Secondary | ICD-10-CM | POA: Diagnosis not present

## 2021-01-04 DIAGNOSIS — K760 Fatty (change of) liver, not elsewhere classified: Secondary | ICD-10-CM | POA: Diagnosis not present

## 2021-01-04 DIAGNOSIS — E7800 Pure hypercholesterolemia, unspecified: Secondary | ICD-10-CM | POA: Diagnosis not present

## 2021-01-04 DIAGNOSIS — M419 Scoliosis, unspecified: Secondary | ICD-10-CM | POA: Diagnosis not present

## 2021-01-04 DIAGNOSIS — I272 Pulmonary hypertension, unspecified: Secondary | ICD-10-CM | POA: Diagnosis not present

## 2021-01-04 DIAGNOSIS — G4733 Obstructive sleep apnea (adult) (pediatric): Secondary | ICD-10-CM | POA: Diagnosis not present

## 2021-01-06 DIAGNOSIS — I272 Pulmonary hypertension, unspecified: Secondary | ICD-10-CM | POA: Diagnosis not present

## 2021-01-06 DIAGNOSIS — E042 Nontoxic multinodular goiter: Secondary | ICD-10-CM | POA: Diagnosis not present

## 2021-01-06 DIAGNOSIS — E739 Lactose intolerance, unspecified: Secondary | ICD-10-CM | POA: Diagnosis not present

## 2021-01-06 DIAGNOSIS — M150 Primary generalized (osteo)arthritis: Secondary | ICD-10-CM | POA: Diagnosis not present

## 2021-01-06 DIAGNOSIS — K219 Gastro-esophageal reflux disease without esophagitis: Secondary | ICD-10-CM | POA: Diagnosis not present

## 2021-01-06 DIAGNOSIS — I5033 Acute on chronic diastolic (congestive) heart failure: Secondary | ICD-10-CM | POA: Diagnosis not present

## 2021-01-06 DIAGNOSIS — I110 Hypertensive heart disease with heart failure: Secondary | ICD-10-CM | POA: Diagnosis not present

## 2021-01-06 DIAGNOSIS — J45909 Unspecified asthma, uncomplicated: Secondary | ICD-10-CM | POA: Diagnosis not present

## 2021-01-06 DIAGNOSIS — I051 Rheumatic mitral insufficiency: Secondary | ICD-10-CM | POA: Diagnosis not present

## 2021-01-06 DIAGNOSIS — R41841 Cognitive communication deficit: Secondary | ICD-10-CM | POA: Diagnosis not present

## 2021-01-06 DIAGNOSIS — I4811 Longstanding persistent atrial fibrillation: Secondary | ICD-10-CM | POA: Diagnosis not present

## 2021-01-06 DIAGNOSIS — Z87891 Personal history of nicotine dependence: Secondary | ICD-10-CM | POA: Diagnosis not present

## 2021-01-06 DIAGNOSIS — R1310 Dysphagia, unspecified: Secondary | ICD-10-CM | POA: Diagnosis not present

## 2021-01-06 DIAGNOSIS — Z471 Aftercare following joint replacement surgery: Secondary | ICD-10-CM | POA: Diagnosis not present

## 2021-01-06 DIAGNOSIS — E559 Vitamin D deficiency, unspecified: Secondary | ICD-10-CM | POA: Diagnosis not present

## 2021-01-06 DIAGNOSIS — E7800 Pure hypercholesterolemia, unspecified: Secondary | ICD-10-CM | POA: Diagnosis not present

## 2021-01-06 DIAGNOSIS — K8020 Calculus of gallbladder without cholecystitis without obstruction: Secondary | ICD-10-CM | POA: Diagnosis not present

## 2021-01-06 DIAGNOSIS — I700 Atherosclerosis of aorta: Secondary | ICD-10-CM | POA: Diagnosis not present

## 2021-01-06 DIAGNOSIS — E669 Obesity, unspecified: Secondary | ICD-10-CM | POA: Diagnosis not present

## 2021-01-06 DIAGNOSIS — K760 Fatty (change of) liver, not elsewhere classified: Secondary | ICD-10-CM | POA: Diagnosis not present

## 2021-01-06 DIAGNOSIS — M419 Scoliosis, unspecified: Secondary | ICD-10-CM | POA: Diagnosis not present

## 2021-01-06 DIAGNOSIS — K589 Irritable bowel syndrome without diarrhea: Secondary | ICD-10-CM | POA: Diagnosis not present

## 2021-01-06 DIAGNOSIS — I779 Disorder of arteries and arterioles, unspecified: Secondary | ICD-10-CM | POA: Diagnosis not present

## 2021-01-06 DIAGNOSIS — G4733 Obstructive sleep apnea (adult) (pediatric): Secondary | ICD-10-CM | POA: Diagnosis not present

## 2021-01-07 DIAGNOSIS — M47816 Spondylosis without myelopathy or radiculopathy, lumbar region: Secondary | ICD-10-CM | POA: Diagnosis not present

## 2021-01-07 DIAGNOSIS — M5136 Other intervertebral disc degeneration, lumbar region: Secondary | ICD-10-CM | POA: Diagnosis not present

## 2021-01-07 DIAGNOSIS — M48062 Spinal stenosis, lumbar region with neurogenic claudication: Secondary | ICD-10-CM | POA: Diagnosis not present

## 2021-01-07 DIAGNOSIS — M5416 Radiculopathy, lumbar region: Secondary | ICD-10-CM | POA: Diagnosis not present

## 2021-01-11 DIAGNOSIS — Z471 Aftercare following joint replacement surgery: Secondary | ICD-10-CM | POA: Diagnosis not present

## 2021-01-11 DIAGNOSIS — I272 Pulmonary hypertension, unspecified: Secondary | ICD-10-CM | POA: Diagnosis not present

## 2021-01-11 DIAGNOSIS — K589 Irritable bowel syndrome without diarrhea: Secondary | ICD-10-CM | POA: Diagnosis not present

## 2021-01-11 DIAGNOSIS — M150 Primary generalized (osteo)arthritis: Secondary | ICD-10-CM | POA: Diagnosis not present

## 2021-01-11 DIAGNOSIS — I4811 Longstanding persistent atrial fibrillation: Secondary | ICD-10-CM | POA: Diagnosis not present

## 2021-01-11 DIAGNOSIS — E559 Vitamin D deficiency, unspecified: Secondary | ICD-10-CM | POA: Diagnosis not present

## 2021-01-11 DIAGNOSIS — R41841 Cognitive communication deficit: Secondary | ICD-10-CM | POA: Diagnosis not present

## 2021-01-11 DIAGNOSIS — I779 Disorder of arteries and arterioles, unspecified: Secondary | ICD-10-CM | POA: Diagnosis not present

## 2021-01-11 DIAGNOSIS — Z87891 Personal history of nicotine dependence: Secondary | ICD-10-CM | POA: Diagnosis not present

## 2021-01-11 DIAGNOSIS — I5033 Acute on chronic diastolic (congestive) heart failure: Secondary | ICD-10-CM | POA: Diagnosis not present

## 2021-01-11 DIAGNOSIS — K760 Fatty (change of) liver, not elsewhere classified: Secondary | ICD-10-CM | POA: Diagnosis not present

## 2021-01-11 DIAGNOSIS — I700 Atherosclerosis of aorta: Secondary | ICD-10-CM | POA: Diagnosis not present

## 2021-01-11 DIAGNOSIS — M419 Scoliosis, unspecified: Secondary | ICD-10-CM | POA: Diagnosis not present

## 2021-01-11 DIAGNOSIS — I051 Rheumatic mitral insufficiency: Secondary | ICD-10-CM | POA: Diagnosis not present

## 2021-01-11 DIAGNOSIS — E042 Nontoxic multinodular goiter: Secondary | ICD-10-CM | POA: Diagnosis not present

## 2021-01-11 DIAGNOSIS — E739 Lactose intolerance, unspecified: Secondary | ICD-10-CM | POA: Diagnosis not present

## 2021-01-11 DIAGNOSIS — I110 Hypertensive heart disease with heart failure: Secondary | ICD-10-CM | POA: Diagnosis not present

## 2021-01-11 DIAGNOSIS — K219 Gastro-esophageal reflux disease without esophagitis: Secondary | ICD-10-CM | POA: Diagnosis not present

## 2021-01-11 DIAGNOSIS — K8020 Calculus of gallbladder without cholecystitis without obstruction: Secondary | ICD-10-CM | POA: Diagnosis not present

## 2021-01-11 DIAGNOSIS — E669 Obesity, unspecified: Secondary | ICD-10-CM | POA: Diagnosis not present

## 2021-01-11 DIAGNOSIS — J45909 Unspecified asthma, uncomplicated: Secondary | ICD-10-CM | POA: Diagnosis not present

## 2021-01-11 DIAGNOSIS — G4733 Obstructive sleep apnea (adult) (pediatric): Secondary | ICD-10-CM | POA: Diagnosis not present

## 2021-01-11 DIAGNOSIS — E7800 Pure hypercholesterolemia, unspecified: Secondary | ICD-10-CM | POA: Diagnosis not present

## 2021-01-11 DIAGNOSIS — R1310 Dysphagia, unspecified: Secondary | ICD-10-CM | POA: Diagnosis not present

## 2021-01-14 ENCOUNTER — Encounter: Payer: Self-pay | Admitting: Nurse Practitioner

## 2021-01-14 ENCOUNTER — Ambulatory Visit: Payer: Medicare HMO | Admitting: Nurse Practitioner

## 2021-01-14 ENCOUNTER — Other Ambulatory Visit: Payer: Self-pay

## 2021-01-14 VITALS — BP 128/78 | HR 99 | Ht 66.5 in | Wt 214.4 lb

## 2021-01-14 DIAGNOSIS — I5032 Chronic diastolic (congestive) heart failure: Secondary | ICD-10-CM

## 2021-01-14 DIAGNOSIS — I4819 Other persistent atrial fibrillation: Secondary | ICD-10-CM

## 2021-01-14 DIAGNOSIS — I10 Essential (primary) hypertension: Secondary | ICD-10-CM

## 2021-01-14 NOTE — Progress Notes (Signed)
Office Visit    Patient Name: Lauren Lloyd Date of Encounter: 01/14/2021  Primary Care Provider:  [name], MD Primary Cardiologist:  Ida Rogue, MD  Chief Complaint    80 year old female with a history of paroxysmal atrial fibrillation, HFpEF, hypertension, asthma, chronic lower extremity swelling, aortic atherosclerosis on CT, asthma, multinodular goiter, and arthritis, presents for follow-up related to A. fib and HFpEF.  Past Medical History    Past Medical History:  Diagnosis Date   (HFpEF) heart failure with preserved ejection fraction ([place])    a. 05/2018 Echo: EF 55-60%, no rwma, mild to mod MR. Nl RV fxn. Mod TR. PASP 48mHg.   Arthritis    knees, Hands   Arthritis of knee    Back pain    Carotid arterial disease ([place]    a. 03/2019 Carotid U/S: <50% bilat ICA stenoses.   Cholelithiasis    a. 10/2018 noted on CT.   Edema, lower extremity    Fatty liver    GERD (gastroesophageal reflux disease)    History of stress test    a. 06/2018 MV: EF 59%, no ischemia/infarct. Low risk.   Knee pain    Lactose intolerance    Mitral regurgitation    a. 05/2018 Echo: mild to mod MR.   Multinodular goiter    Obesity    OSA (obstructive sleep apnea)    PAF (paroxysmal atrial fibrillation) ([place]    a.  Diagnosed 12/19; b. 05/2018 s/p DCCV; c. 03/2019 & 05/2019 recurrent AFib-->managed w/ amio load; d. CHADS2VASc = 6 (CHF, HTN, age x 2, vascular disease, female)-->Eliquis & amio 100 qd.   PAH (pulmonary artery hypertension) (HCC)    Scoliosis    SOB (shortness of breath)    Swallowing difficulty    Past Surgical History:  Procedure Laterality Date   CARDIOVERSION N/A 06/15/2018   Procedure: CARDIOVERSION (CATH LAB);  Surgeon: GMinna Merritts MD;  Location: [facility] ORS;  Service: Cardiovascular;  Laterality: N/A;   CATARACT EXTRACTION W/PHACO Right 01/25/2016   Procedure: CATARACT EXTRACTION PHACO AND INTRAOCULAR LENS PLACEMENT ([place];  Surgeon: ARonnell Freshwater MD;  [place]  Service: Ophthalmology;  Laterality: Right;  RIGHT   CATARACT EXTRACTION W/PHACO Left 02/22/2016   Procedure: CATARACT EXTRACTION PHACO AND INTRAOCULAR LENS PLACEMENT ([place];  Surgeon: ARonnell Freshwater MD;  [place]  Service: Ophthalmology;  Laterality: Left;  LEFT   HAMMER TOE SURGERY  2010   KNEE ARTHROSCOPY Right 2004   [facility]  SKIN GRAFT Left 04/08/2013   Done on left index finger   TONSILLECTOMY  1946    Allergies  Allergies  Allergen Reactions   Levofloxacin     Other reaction(s): Joint Pains Other reaction(s): Other (See Comments) Joint pain   Influenza Vaccines Other (See Comments)    Bell's Palsy   Oysters [Shellfish Allergy] Swelling    She states she had eaten them three days in a row and she developed swelling around her eyes.     History of Present Illness    80year old female with the above past medical history including paroxysmal atrial fibrillation, HFpEF, hypertension, asthma, chronic lower extremity swelling, aortic atherosclerosis on CT, pulmonary hypertension, asthma, multinodular goiter, and arthritis.  She was previously diagnosed with atrial fibrillation in December 2019.  She was placed on oral anticoagulation and subsequently underwent cardioversion in January 2020.  She had recurrent atrial fibrillation November 2020  and was placed on amiodarone therapy.  She converted to sinus rhythm but then stopped amiodarone by early December due to concerns about dyspnea.  Amiodarone was subsequently resumed and she was noted to be back in atrial fibrillation January 2021.  Following additional amiodarone loading, she had converted back to sinus rhythm in April 2021.  Zio monitoring and October 2021 did not show any evidence of atrial fibrillation.  She did have 20 brief episodes of SVT, though none of these were triggered  events.  Ms. Root underwent knee surgery at [facility] in July.  She noted tachypalpitations consistent with atrial fibrillation approximately 2 days after surgery and was told increase her amiodarone to 2 mg daily and bisoprolol up to 5 mg daily.  She was last seen in clinic on August 3, and was noted to be in atrial fibrillation at a rate of 80 bpm.  She reported that she was only taking 100 mg of amiodarone and 2.5 mg of bisoprolol.  As Eliquis was held for 4 days prior to surgery, amiodarone was not titrated at that time and follow-up was recommended in 3 weeks.  Since her last visit, she has felt reasonably well, though has continued to note elevated heart rates during physical therapy for her knee.  In that setting, the therapist will not let her continue.  She does have some degree of dyspnea on exertion but overall, she feels that this is stable.  She has not been have any significant edema and denies chest pain, palpitations, PND, orthopnea, dizziness, syncope, or early satiety.  She has been compliant with Eliquis and is currently taking amiodarone 100 mg daily.  Home Medications    Current Outpatient Medications  Medication Sig Dispense Refill   amiodarone (PACERONE) 100 MG tablet Take 2 tablets (200 mg total) by mouth daily. (Patient taking differently: Take 100 mg by mouth daily.) 180 tablet 3   apixaban (ELIQUIS) 5 MG TABS tablet Take 1 tablet (5 mg total) by mouth 2 (two) times daily. 180 tablet 3   bisoprolol (ZEBETA) 5 MG tablet Take 1 tablet (5 mg total) by mouth daily. May take extra 2.5 up to 5 mg in the pm for HR >90 100 tablet 3   calcium carbonate (TUMS EX) 750 MG chewable tablet Chew by mouth.     Cholecalciferol 25 MCG (1000 UT) tablet Take by mouth.     cyanocobalamin 1000 MCG tablet Take by mouth.     furosemide (LASIX) 40 MG tablet Take 1 tablet (40 mg total) by mouth daily. Take extra 40 mg as needed after lunch for abdominal swelling, leg swelling, or shortness of breath 100  tablet 3   loperamide (IMODIUM) 2 MG capsule Take 2 mg by mouth as needed.      metaxalone (SKELAXIN) 800 MG tablet Take 800 mg by mouth 3 (three) times daily.      Multiple Vitamin (MULTIVITAMIN) capsule Take 1 capsule by mouth daily.     potassium chloride (KLOR-CON) 10 MEQ tablet Take 1 tablet (10 mEq total) by mouth daily. 90 tablet 3   traMADol (ULTRAM) 50 MG tablet Take 1 tablet (50 mg total) by mouth every 6 (six) hours as needed. 8 tablet 0   No current facility-administered medications for this visit.     Review of Systems    Chronic, stable dyspnea on exertion.  She denies chest pain, palpitations, PND, orthopnea, dizziness, syncope, edema, or early satiety.  All other systems reviewed and are otherwise negative except as noted  above.  Physical Exam    VS:  BP 128/78 (BP Location: Left Arm, Patient Position: Sitting, Cuff Size: Normal)   Pulse 99   Ht 5' 6.5" (1.689 m)   Wt 214 lb 6 oz (97.2 kg)   SpO2 98%   BMI 34.08 kg/m  , BMI Body mass index is 34.08 kg/m.     GEN: Well nourished, well developed, in no acute distress. HEENT: normal. Neck: Supple, no JVD, carotid bruits, or masses. Cardiac: Irregularly irregular, no murmurs, rubs, or gallops. No clubbing, cyanosis, edema.  Radials/PT 2+ and equal bilaterally.  Respiratory:  Respirations regular and unlabored, clear to auscultation bilaterally. GI: Soft, nontender, nondistended, BS + x 4. MS: no deformity or atrophy. Skin: warm and dry, no rash. Neuro:  Strength and sensation are intact. Psych: Normal affect.  Accessory Clinical Findings    ECG personally reviewed by me today -atrial fibrillation, 99, delayed R wave progression- no acute changes.  Lab Results  Component Value Date   WBC 14.4 (H) 09/24/2020   HGB 12.8 09/24/2020   HCT 37.4 09/24/2020   MCV 91.4 09/24/2020   PLT 278 09/24/2020   Lab Results  Component Value Date   CREATININE 0.89 09/24/2020   BUN 21 09/24/2020   NA 136 09/24/2020   K 3.9  09/24/2020   CL 102 09/24/2020   CO2 21 (L) 09/24/2020   Lab Results  Component Value Date   ALT 14 09/24/2020   AST 18 09/24/2020   ALKPHOS 73 09/24/2020   BILITOT 0.9 09/24/2020   Lab Results  Component Value Date   CHOL 289 (H) 08/03/2020   HDL 65 08/03/2020   LDLCALC 188 (H) 08/03/2020   TRIG 193 (H) 08/03/2020   CHOLHDL 4.4 08/03/2020    Lab Results  Component Value Date   HGBA1C 5.5 12/09/2019    Assessment & Plan    1.  Persistent atrial fibrillation: Status post knee surgery at [facility] in July with palpitations occurring shortly thereafter.  Eliquis was held for 4 days prior to surgery and in that setting, we were reluctant to increase her amiodarone previously.  She has now been on oral anticoagulation for 4 consecutive weeks and remains in atrial fibrillation at 99 bpm.  She is not particularly symptomatic but does have some dyspnea on exertion.  She is currently on amiodarone 100 mg daily and bisoprolol 5 mg daily.  I have asked her to increase her amiodarone to 200 mg daily.  We will plan to see her back in approximately 2 weeks, at which time if she remains in atrial fibrillation, we will arrange for cardioversion.  2.  Essential hypertension: This is stable on beta-blocker and furosemide therapy.  3.  Chronic HFpEF: Despite ongoing elevation in rates, she is euvolemic on examination.  Heart rates have been elevated at home and I am increasing amiodarone to 200 mg daily.  Remains on bisoprolol otherwise.  Blood pressure stable.  Continue current dose of Lasix.  4.  Disposition: Follow-up in clinic in approximately 2 weeks and consider cardioversion if appropriate at that point.  Murray Hodgkins, NP 01/14/2021, 5:33 PM

## 2021-01-14 NOTE — Patient Instructions (Signed)
Medication Instructions:  - Your physician has recommended you make the following change in your medication:   1) INCREASE amiodarone 100 mg: take 2 tablets (200 mg) by mouth once daily   *If you need a refill on your cardiac medications before your next appointment, please call your pharmacy*   Lab Work: - none ordered  If you have labs (blood work) drawn today and your tests are completely normal, you will receive your results only by: [place] (if you have MyChart) OR A paper copy in the mail If you have any lab test that is abnormal or we need to change your treatment, we will call you to review the results.   Testing/Procedures: - none ordered   Follow-Up: At [facility], you and your health needs are our priority.  As part of our continuing mission to provide you with exceptional heart care, we have created designated Provider Care Teams.  These Care Teams include your primary Cardiologist (physician) and Advanced Practice Providers (APPs -  Physician Assistants and Nurse Practitioners) who all work together to provide you with the care you need, when you need it.  We recommend signing up for the patient portal called "MyChart".  Sign up information is provided on this After Visit Summary.  MyChart is used to connect with patients for Virtual Visits (Telemedicine).  Patients are able to view lab/test results, encounter notes, upcoming appointments, etc.  Non-urgent messages can be sent to your provider as well.   To learn more about what you can do with MyChart, go to [email or web address].    Your next appointment:   2 week(s)  The format for your next appointment:   In Person  Provider:   You may see Ida Rogue, MD or one of the following Advanced Practice Providers on your designated Care Team:   Murray Hodgkins, NP    Other Instructions N/a

## 2021-01-19 DIAGNOSIS — E7800 Pure hypercholesterolemia, unspecified: Secondary | ICD-10-CM | POA: Diagnosis not present

## 2021-01-19 DIAGNOSIS — Z471 Aftercare following joint replacement surgery: Secondary | ICD-10-CM | POA: Diagnosis not present

## 2021-01-19 DIAGNOSIS — E042 Nontoxic multinodular goiter: Secondary | ICD-10-CM | POA: Diagnosis not present

## 2021-01-19 DIAGNOSIS — R1310 Dysphagia, unspecified: Secondary | ICD-10-CM | POA: Diagnosis not present

## 2021-01-19 DIAGNOSIS — I5033 Acute on chronic diastolic (congestive) heart failure: Secondary | ICD-10-CM | POA: Diagnosis not present

## 2021-01-19 DIAGNOSIS — I779 Disorder of arteries and arterioles, unspecified: Secondary | ICD-10-CM | POA: Diagnosis not present

## 2021-01-19 DIAGNOSIS — G4733 Obstructive sleep apnea (adult) (pediatric): Secondary | ICD-10-CM | POA: Diagnosis not present

## 2021-01-19 DIAGNOSIS — Z87891 Personal history of nicotine dependence: Secondary | ICD-10-CM | POA: Diagnosis not present

## 2021-01-19 DIAGNOSIS — J45909 Unspecified asthma, uncomplicated: Secondary | ICD-10-CM | POA: Diagnosis not present

## 2021-01-19 DIAGNOSIS — M150 Primary generalized (osteo)arthritis: Secondary | ICD-10-CM | POA: Diagnosis not present

## 2021-01-19 DIAGNOSIS — I4811 Longstanding persistent atrial fibrillation: Secondary | ICD-10-CM | POA: Diagnosis not present

## 2021-01-19 DIAGNOSIS — K8020 Calculus of gallbladder without cholecystitis without obstruction: Secondary | ICD-10-CM | POA: Diagnosis not present

## 2021-01-19 DIAGNOSIS — I110 Hypertensive heart disease with heart failure: Secondary | ICD-10-CM | POA: Diagnosis not present

## 2021-01-19 DIAGNOSIS — E669 Obesity, unspecified: Secondary | ICD-10-CM | POA: Diagnosis not present

## 2021-01-19 DIAGNOSIS — I051 Rheumatic mitral insufficiency: Secondary | ICD-10-CM | POA: Diagnosis not present

## 2021-01-19 DIAGNOSIS — M419 Scoliosis, unspecified: Secondary | ICD-10-CM | POA: Diagnosis not present

## 2021-01-19 DIAGNOSIS — I272 Pulmonary hypertension, unspecified: Secondary | ICD-10-CM | POA: Diagnosis not present

## 2021-01-19 DIAGNOSIS — R41841 Cognitive communication deficit: Secondary | ICD-10-CM | POA: Diagnosis not present

## 2021-01-19 DIAGNOSIS — E559 Vitamin D deficiency, unspecified: Secondary | ICD-10-CM | POA: Diagnosis not present

## 2021-01-19 DIAGNOSIS — K589 Irritable bowel syndrome without diarrhea: Secondary | ICD-10-CM | POA: Diagnosis not present

## 2021-01-19 DIAGNOSIS — K760 Fatty (change of) liver, not elsewhere classified: Secondary | ICD-10-CM | POA: Diagnosis not present

## 2021-01-19 DIAGNOSIS — I700 Atherosclerosis of aorta: Secondary | ICD-10-CM | POA: Diagnosis not present

## 2021-01-19 DIAGNOSIS — K219 Gastro-esophageal reflux disease without esophagitis: Secondary | ICD-10-CM | POA: Diagnosis not present

## 2021-01-19 DIAGNOSIS — E739 Lactose intolerance, unspecified: Secondary | ICD-10-CM | POA: Diagnosis not present

## 2021-01-21 DIAGNOSIS — M48062 Spinal stenosis, lumbar region with neurogenic claudication: Secondary | ICD-10-CM | POA: Diagnosis not present

## 2021-01-21 DIAGNOSIS — M5416 Radiculopathy, lumbar region: Secondary | ICD-10-CM | POA: Diagnosis not present

## 2021-01-27 ENCOUNTER — Ambulatory Visit: Payer: Medicare HMO | Admitting: Cardiovascular Disease

## 2021-01-27 NOTE — Progress Notes (Deleted)
Date:  01/27/2021   ID:  Lauren, Lloyd February 15, 1941, MRN [id number]  Patient Location:  [address] [place] [address]   Provider location:   [facility] [place]  PCP:  [name], MD  Cardiologist:  [facility]  No chief complaint on file.   History of Present Illness:    Lauren Lloyd is a 80 y.o. female  past medical history of Aortic atherosclerosis on CT scan 2020 hypertension,  asthma  Chronic lower extremity swelling Hospital admission for atrial fibrillation December 2019 Associated acute diastolic CHF Status post successful DCCV on 06/15/2018.  NSR on 06/2018 stress test, 07/2018 Showing no significant ischemia Mild lower extremity swelling, chronic Who presents for hospital follow-up, follow-up of her atrial fibrillation and acute on chronic diastolic CHF  Prior office visit 4/21 some shortness of breath on exertion amio down to 100 daily   Last office visit December 2021 At that time was normal sinus rhythm  Had total knee replacement July 20 at [facility], into atrial fibrillation Reports that she had surgery on Wednesday, was noted to have tachypalpitations consistent with atrial fibrillation 2 days later in recovery Told at [facility] to increase the amiodarone up to 200 mg daily increase bisoprolol up to 5 mg daily  [facility] at [facility]  Still in atrial fibrillation today Reports that she is taking amiodarone 100 daily bisoprolol 2.5 daily Lasix 40 daily Does not feel that she has shortness of breath abdominal distention or leg swelling Some fatigue but wonders if it is from recovering from surgery or from the atrial fibrillation  Followed by Lanney Gins, pulmonary.  Has not seen him recently On and off her CPAP  History of back pain, has received cortisone injections Followed by Dr. Sharlet Salina  EKG personally reviewed by myself on todays visit Shows atrial fibrillation with ventricular rate of 80 bpm no  significant ST-T wave changes  Other past medical history reviewed  Nov 2020, developed atrial fib Started on amiodarone, higher dose metoprolol Lasix increased,40 BID 3 days then back to 40 daily She continued on 40 BID  She presented to the clinic and had stopped her amiodarone  She was still in normal sinus rhythm but she had worsening shortness of breath  She reported very high fluid intake  REDS vest 40% on 04/16/2019, goal less than 36  stress test, 07/2018 Showing no significant ischemia normal ejection fraction normal perfusion  Presented to the hospital 04/2018 with worsening shortness of breath, leg swelling, weight gain, abdominal bloating and new atrial fibrillation with RVR Initially on Cardizem infusion, changed to oral Cardizem with titration upwards Discharged on Cardizem CD 300 mg and metoprolol 25 mg bid, Eliquis. Treated with IV Lasix during her hospital course with marked improvement in her shortness of breath   Carotid ultrasound January 2020 Less than 39% disease bilaterally   Past Medical History:  Diagnosis Date   (HFpEF) heart failure with preserved ejection fraction ([place])    a. 05/2018 Echo: EF 55-60%, no rwma, mild to mod MR. Nl RV fxn. Mod TR. PASP 62mHg.   Arthritis    knees, Hands   Arthritis of knee    Back pain    Carotid arterial disease ([place]    a. 03/2019 Carotid U/S: <50% bilat ICA stenoses.   Cholelithiasis    a. 10/2018 noted on CT.   Edema, lower extremity    Fatty liver    GERD (gastroesophageal reflux disease)  History of stress test    a. 06/2018 MV: EF 59%, no ischemia/infarct. Low risk.   Knee pain    Lactose intolerance    Mitral regurgitation    a. 05/2018 Echo: mild to mod MR.   Multinodular goiter    Obesity    OSA (obstructive sleep apnea)    PAF (paroxysmal atrial fibrillation) ([place])    a.  Diagnosed 12/19; b. 05/2018 s/p DCCV; c. 03/2019 & 05/2019 recurrent AFib-->managed w/ amio load; d. CHADS2VASc = 6 (CHF, HTN, age x 2,  vascular disease, female)-->Eliquis & amio 100 qd.   PAH (pulmonary artery hypertension) (HCC)    Scoliosis    SOB (shortness of breath)    Swallowing difficulty    Past Surgical History:  Procedure Laterality Date   CARDIOVERSION N/A 06/15/2018   Procedure: CARDIOVERSION (CATH LAB);  Surgeon: Minna Merritts, MD;  Location: [facility] ORS;  Service: Cardiovascular;  Laterality: N/A;   CATARACT EXTRACTION W/PHACO Right 01/25/2016   Procedure: CATARACT EXTRACTION PHACO AND INTRAOCULAR LENS PLACEMENT ([place]);  Surgeon: Ronnell Freshwater, MD;  Location: [place];  Service: Ophthalmology;  Laterality: Right;  RIGHT   CATARACT EXTRACTION W/PHACO Left 02/22/2016   Procedure: CATARACT EXTRACTION PHACO AND INTRAOCULAR LENS PLACEMENT ([place]);  Surgeon: Ronnell Freshwater, MD;  Location: [place];  Service: Ophthalmology;  Laterality: Left;  LEFT   HAMMER TOE SURGERY  2010   KNEE ARTHROSCOPY Right 2004   REPLACEMENT TOTAL KNEE Right 2009   [facility]   SKIN GRAFT Left 04/08/2013   Done on left index finger   TONSILLECTOMY  1946     No outpatient medications have been marked as taking for the 01/27/21 encounter (Appointment) with Minna Merritts, MD.     Allergies:   Levofloxacin, Influenza vaccines, and Oysters [shellfish allergy]   Social History   Tobacco Use   Smoking status: Former    Packs/day: 1.00    Years: 30.00    Pack years: 30.00    Types: Cigarettes    Quit date: 05/16/1989    Years since quitting: 31.7   Smokeless tobacco: Never  Vaping Use   Vaping Use: Never used  Substance Use Topics   Alcohol use: Yes    Alcohol/week: 7.0 standard drinks    Types: 7 Glasses of wine per week    Comment: 0-2 a night   Drug use: No     Family Hx: [name] family history includes Atrial fibrillation in her sister and sister; Breast cancer (age of onset: 18) in her sister; Healthy in her brother; Heart attack in her father; Heart  disease in her mother; High blood pressure in her father; Hyperlipidemia in her sister and sister; Stroke in her father; Transient ischemic attack in her mother.  ROS:   Please see the history of present illness.    Review of Systems  Constitutional: Negative.   HENT: Negative.    Respiratory:  Positive for shortness of breath.   Cardiovascular: Negative.   Gastrointestinal: Negative.   Musculoskeletal:  Positive for joint pain.  Neurological: Negative.   Psychiatric/Behavioral: Negative.    All other systems reviewed and are negative.   Labs/Other Tests and Data Reviewed:    Recent Labs: 08/03/2020: TSH 1.020 09/24/2020: ALT 14; BUN 21; Creatinine, Ser 0.89; Hemoglobin 12.8; Platelets 278; Potassium 3.9; Sodium 136   Recent Lipid Panel Lab Results  Component Value Date/Time   CHOL 289 (H) 08/03/2020 10:12 AM   TRIG 193 (H) 08/03/2020  10:12 AM   HDL 65 08/03/2020 10:12 AM   CHOLHDL 4.4 08/03/2020 10:12 AM   CHOLHDL 3.6 05/31/2018 04:52 AM   LDLCALC 188 (H) 08/03/2020 10:12 AM    Wt Readings from Last 3 Encounters:  01/14/21 214 lb 6 oz (97.2 kg)  12/16/20 213 lb (96.6 kg)  09/24/20 204 lb (92.5 kg)     Exam:    There were no vitals taken for this visit. Constitutional:  oriented to person, place, and time. No distress.  HENT:  Head: Grossly normal Eyes:  no discharge. No scleral icterus.  Neck: No JVD, no carotid bruits  Cardiovascular: Regular rate and rhythm, no murmurs appreciated Pulmonary/Chest: Clear to auscultation bilaterally, no wheezes or rails Abdominal: Soft.  no distension.  no tenderness.  Musculoskeletal: Normal range of motion Neurological:  normal muscle tone. Coordination normal. No atrophy Skin: Skin warm and dry Psychiatric: normal affect, pleasant   ASSESSMENT & PLAN:    Paroxysmal atrial fibrillation (HCC) -  Back into atrial fibrillation Was off Eliquis 4 days prior to procedure/total knee replacement per the patient Unclear when Eliquis  was started, she reports it was post procedure She is unclear when atrial fibrillation started but had symptoms 2 days following surgery Currently relatively asymptomatic She is only taking amiodarone 100 daily bisoprolol 2.5 daily We recommend she increase bisoprolol up to 5 mg daily even with extra two-point 5 in the evening if rate runs over 90 Will wait additional 3 weeks of anticoagulation before increasing amiodarone in effort to converted to normal sinus rhythm -Recommend we meet again in the clinic in 3 weeks time  Chronic diastolic CHF (congestive heart failure) ([place]) Reports she is compliant with Lasix 40 daily Recommended extra Lasix 40 mg after lunch for worsening leg swelling abdominal distention or shortness of breath  Leg edema We will avoid calcium channel blockers Lasix 40 daily, extra Lasix after lunch for worsening ankle swelling   Centrilobular emphysema (HCC)  Prior history of smoking Followed by pulmonary, Denies any recent exacerbations  Pulmonary hypertension, unspecified (HCC) Moderate pulmonary hypertension seen on prior echocardiogram Continue Lasix 40 daily Extra Lasix as detailed above Symptoms may be exacerbated by recurrence of her atrial fibrillation  Back pain, knee pain Weight loss, walking program  Records requested from [facility], reviewed Long discussion concerning management of her atrial fibrillation  Total encounter time more than 35 minutes  Greater than 50% was spent in counseling and coordination of care with the patient    Signed, Ida Rogue, MD  01/27/2021 7:40 AM    [address] [place] [id number], [place], [place] [id number]

## 2021-01-29 ENCOUNTER — Telehealth: Payer: Self-pay

## 2021-01-29 NOTE — Progress Notes (Signed)
Chronic Care Management Pharmacy Assistant   Name: Lauren Lloyd  MRN: [id number] DOB: 1940-10-19  Reason for Encounter:Hypertension Disease State Call.   Recent office visits:  No recent Office Visit  Recent consult visits:  01/25/2021 Dr. Erby Pian MD (Orthopaedics) Start Amoxicillin  500 MG capsule  ,follow-up in 6 months  01/14/2021 Murray Hodgkins NP (cardiology) increase her amiodarone to 200 mg daily,Follow-up in clinic in approximately 2 weeks 01/07/2021 Sharlet Salina DO (Physical Medicine) Follow up in 4 weeks  Hospital visits:  None in previous 6 months  Medications: Outpatient Encounter Medications as of 01/29/2021  Medication Sig Note   amiodarone (PACERONE) 100 MG tablet Take 2 tablets (200 mg total) by mouth daily. (Patient taking differently: Take 100 mg by mouth daily.)    apixaban (ELIQUIS) 5 MG TABS tablet Take 1 tablet (5 mg total) by mouth 2 (two) times daily.    bisoprolol (ZEBETA) 5 MG tablet Take 1 tablet (5 mg total) by mouth daily. May take extra 2.5 up to 5 mg in the pm for HR >90    calcium carbonate (TUMS EX) 750 MG chewable tablet Chew by mouth.    Cholecalciferol 25 MCG (1000 UT) tablet Take by mouth.    cyanocobalamin 1000 MCG tablet Take by mouth.    furosemide (LASIX) 40 MG tablet Take 1 tablet (40 mg total) by mouth daily. Take extra 40 mg as needed after lunch for abdominal swelling, leg swelling, or shortness of breath    loperamide (IMODIUM) 2 MG capsule Take 2 mg by mouth as needed.     metaxalone (SKELAXIN) 800 MG tablet Take 800 mg by mouth 3 (three) times daily.  01/14/2021: PRN   Multiple Vitamin (MULTIVITAMIN) capsule Take 1 capsule by mouth daily.    potassium chloride (KLOR-CON) 10 MEQ tablet Take 1 tablet (10 mEq total) by mouth daily.    traMADol (ULTRAM) 50 MG tablet Take 1 tablet (50 mg total) by mouth every 6 (six) hours as needed.    No facility-administered encounter medications on file as of 01/29/2021.   Care Gaps: Zoster  Vaccines- Shingrix INFLUENZA VACCINE  Star Rating Drugs: None ID Medication Fill Gaps: None ID  Reviewed chart prior to disease state call. Spoke with patient regarding BP  Recent Office Vitals: BP Readings from Last 3 Encounters:  01/14/21 128/78  12/16/20 110/70  09/24/20 (!) 130/52   Pulse Readings from Last 3 Encounters:  01/14/21 99  12/16/20 80  09/24/20 60    Wt Readings from Last 3 Encounters:  01/14/21 214 lb 6 oz (97.2 kg)  12/16/20 213 lb (96.6 kg)  09/24/20 204 lb (92.5 kg)     Kidney Function Lab Results  Component Value Date/Time   CREATININE 0.89 09/24/2020 09:49 AM   CREATININE 0.99 08/03/2020 10:12 AM   CREATININE 0.78 01/19/2017 03:22 PM   GFRNONAA >60 09/24/2020 09:49 AM   GFRNONAA 73 01/19/2017 03:22 PM   GFRAA 75 12/09/2019 02:11 PM   GFRAA 85 01/19/2017 03:22 PM    BMP Latest Ref Rng & Units 09/24/2020 08/03/2020 12/09/2019  Glucose 70 - 99 mg/dL 127(H) 98 94  BUN 8 - 23 mg/dL '21 20 20  '$ Creatinine 0.44 - 1.00 mg/dL 0.89 0.99 0.85  BUN/Creat Ratio 12 - 28 - 20 24  Sodium 135 - 145 mmol/L 136 144 143  Potassium 3.5 - 5.1 mmol/L 3.9 4.6 4.7  Chloride 98 - 111 mmol/L 102 103 103  CO2 22 - 32 mmol/L 21(L) 26 23  Calcium 8.9 - 10.3 mg/dL 9.0 9.5 9.3    Current antihypertensive regimen:  Lasix 40 mg Daily for pulmonary hypertension Bisoprolol 5 mg take 1 tablet daily may take an extra 2.5 up to 5 as needed for HR  Any recent hospitalizations or ED visits since last visit with CPP? No  I have attempted without success to contact this patient by phone three times to do her Hypertension Disease State call. I left a Voice message for patient to return my call.LVM 09/16,09/20 Patient states she will return my call,Line Busy 09/22   Adherence Review: Is the patient currently on ACE/ARB medication? No Does the patient have >5 day gap between last estimated fill dates? No  Anderson Malta Clinical Production designer, theatre/television/film [phone]

## 2021-02-02 ENCOUNTER — Other Ambulatory Visit: Payer: Self-pay

## 2021-02-02 MED ORDER — AMIODARONE HCL 200 MG PO TABS
200.0000 mg | ORAL_TABLET | Freq: Every day | ORAL | 3 refills | Status: DC
Start: 2021-02-02 — End: 2021-02-03

## 2021-02-02 NOTE — Telephone Encounter (Signed)
[place] calling to verify if there was a dose increase on patients amiodarone.   Made her aware per Emeline Gins last OV   " I have asked her to increase her amiodarone to 200 mg daily. "  She asked if we could send an updated script with 200MG  daily.  Made her aware I would send that over.

## 2021-02-03 ENCOUNTER — Other Ambulatory Visit: Payer: Self-pay

## 2021-02-03 MED ORDER — AMIODARONE HCL 200 MG PO TABS: 200.0000 mg | ORAL_TABLET | Freq: Every day | ORAL | 3 refills | Status: DC

## 2021-02-05 NOTE — Progress Notes (Signed)
Date:  02/08/2021   ID:  Lauren Lloyd, Riese 1940-09-18, MRN [id number]  Patient Location:  [address] [place] [address]   Provider location:   [facility] [place] [facility]  PCP:  [name], MD  Cardiologist:  Arvid Right [facility]  Chief Complaint  Patient presents with   2 week follow up     Patient c/o shortness of breath, fatigue and having A-fib, would like to discuss having the cardioversion. Medications reviewed by the patient verbally.     History of Present Illness:    Lauren Lloyd is a 80 y.o. female  past medical history of Aortic atherosclerosis on CT scan 2020 hypertension,  asthma  Chronic lower extremity swelling Hospital admission for atrial fibrillation December 2019 Associated acute diastolic CHF Status post successful DCCV on 06/15/2018.  NSR on 06/2018 stress test, 07/2018 Showing no significant ischemia Mild lower extremity swelling, chronic Who presents for hospital follow-up, follow-up of her atrial fibrillation and acute on chronic diastolic CHF  Last seen in clinic December 16, 2020, at that time noted to be in atrial fibrillation She feels atrial fibrillation started June 2022 after her knee surgery Back to the clinic today after completing course of anticoagulation Interested in restoring normal sinus rhythm, tolerating amiodarone 200 daily, bisoprolol 5 twice daily Reports heart rate continues to run 120 bpm Denies missing any of her Eliquis  Can not tolerate CPAP, mask does not stay on, takes it off Sleeping poorly, wonders if other modalities for sleep apnea might work  Followed by [employer], pulmonary.    History of back pain, has received cortisone injections Followed by Dr. Sharlet Salina  EKG personally reviewed by myself on todays visit Shows atrial fibrillation per R wave progression to the anterior precordial leads with ventricular rate of 114 bpm no significant ST-T wave changes, PVCs  Other past medical  history reviewed  Nov 2020, developed atrial fib Started on amiodarone, higher dose metoprolol Lasix increased,40 BID 3 days then back to 40 daily She continued on 40 BID  She presented to the clinic and had stopped her amiodarone  She was still in normal sinus rhythm but she had worsening shortness of breath  She reported very high fluid intake  REDS vest 40% on 04/16/2019, goal less than 36  stress test, 07/2018 Showing no significant ischemia normal ejection fraction normal perfusion  Presented to the hospital 04/2018 with worsening shortness of breath, leg swelling, weight gain, abdominal bloating and new atrial fibrillation with RVR Initially on Cardizem infusion, changed to oral Cardizem with titration upwards Discharged on Cardizem CD 300 mg and metoprolol 25 mg bid, Eliquis. Treated with IV Lasix during her hospital course with marked improvement in her shortness of breath   Carotid ultrasound January 2020 Less than 39% disease bilaterally   Past Medical History:  Diagnosis Date   (HFpEF) heart failure with preserved ejection fraction ([place])    a. 05/2018 Echo: EF 55-60%, no rwma, mild to mod MR. Nl RV fxn. Mod TR. PASP 51mmHg.   Arthritis    knees, Hands   Arthritis of knee    Back pain    Carotid arterial disease ([place])    a. 03/2019 Carotid U/S: <50% bilat ICA stenoses.   Cholelithiasis    a. 10/2018 noted on CT.   Edema, lower extremity    Fatty liver    GERD (gastroesophageal reflux disease)    History of stress test    a. 06/2018 MV:  EF 59%, no ischemia/infarct. Low risk.   Knee pain    Lactose intolerance    Mitral regurgitation    a. 05/2018 Echo: mild to mod MR.   Multinodular goiter    Obesity    OSA (obstructive sleep apnea)    PAF (paroxysmal atrial fibrillation) ([place])    a.  Diagnosed 12/19; b. 05/2018 s/p DCCV; c. 03/2019 & 05/2019 recurrent AFib-->managed w/ amio load; d. CHADS2VASc = 6 (CHF, HTN, age x 2, vascular disease, female)-->Eliquis & amio 100 qd.    PAH (pulmonary artery hypertension) (HCC)    Scoliosis    SOB (shortness of breath)    Swallowing difficulty    Past Surgical History:  Procedure Laterality Date   CARDIOVERSION N/A 06/15/2018   Procedure: CARDIOVERSION (CATH LAB);  Surgeon: Minna Merritts, MD;  Location: [facility];  Service: Cardiovascular;  Laterality: N/A;   CATARACT EXTRACTION W/PHACO Right 01/25/2016   Procedure: CATARACT EXTRACTION PHACO AND INTRAOCULAR LENS PLACEMENT ([place]);  Surgeon: Ronnell Freshwater, MD;  Location: [place];  Service: Ophthalmology;  Laterality: Right;  RIGHT   CATARACT EXTRACTION W/PHACO Left 02/22/2016   Procedure: CATARACT EXTRACTION PHACO AND INTRAOCULAR LENS PLACEMENT ([place]);  Surgeon: Ronnell Freshwater, MD;  Location: [place];  Service: Ophthalmology;  Laterality: Left;  LEFT   HAMMER TOE SURGERY  05/16/2008   KNEE ARTHROSCOPY Right 05/16/2002   REPLACEMENT TOTAL KNEE Right 05/17/2007   [facility]   SKIN GRAFT Left 04/08/2013   Done on left index finger   TONSILLECTOMY  05/16/1944   TOTAL KNEE ARTHROPLASTY Left 12/02/2020     Current Meds  Medication Sig   apixaban (ELIQUIS) 5 MG TABS tablet Take 1 tablet (5 mg total) by mouth 2 (two) times daily.   calcium carbonate (TUMS EX) 750 MG chewable tablet Chew by mouth.   Cholecalciferol 25 MCG (1000 UT) tablet Take by mouth.   cyanocobalamin 1000 MCG tablet Take by mouth.   furosemide (LASIX) 40 MG tablet Take 1 tablet (40 mg total) by mouth daily. Take extra 40 mg as needed after lunch for abdominal swelling, leg swelling, or shortness of breath   loperamide (IMODIUM) 2 MG capsule Take 2 mg by mouth as needed.    metaxalone (SKELAXIN) 800 MG tablet Take 800 mg by mouth 3 (three) times daily.    Multiple Vitamin (MULTIVITAMIN) capsule Take 1 capsule by mouth daily.   potassium chloride (KLOR-CON) 10 MEQ tablet Take 1 tablet (10 mEq total) by mouth daily.   traZODone (DESYREL) 50  MG tablet Take 0.5-1 tablets (25-50 mg total) by mouth at bedtime as needed for sleep.   [DISCONTINUED] amiodarone (PACERONE) 200 MG tablet Take 1 tablet (200 mg total) by mouth daily.   [DISCONTINUED] bisoprolol (ZEBETA) 5 MG tablet Take 1 tablet (5 mg total) by mouth daily. May take extra 2.5 up to 5 mg in the pm for HR >90     Allergies:   Levofloxacin, Influenza vaccines, and Oysters [shellfish allergy]   Social History   Tobacco Use   Smoking status: Former    Packs/day: 1.00    Years: 30.00    Pack years: 30.00    Types: Cigarettes    Quit date: 05/16/1989    Years since quitting: 31.7   Smokeless tobacco: Never  Vaping Use   Vaping Use: Never used  Substance Use Topics   Alcohol use: Yes    Alcohol/week: 7.0 standard drinks    Types: 7 Glasses of wine  per week    Comment: 0-2 a night   Drug use: No     Family Hx: [name] family history includes Atrial fibrillation in her sister and sister; Breast cancer (age of onset: 37) in her sister; Healthy in her brother; Heart attack in her father; Heart disease in her mother; High blood pressure in her father; Hyperlipidemia in her sister and sister; Stroke in her father; Transient ischemic attack in her mother.  ROS:   Please see the history of present illness.    Review of Systems  Constitutional: Negative.   HENT: Negative.    Respiratory:  Positive for shortness of breath.   Cardiovascular: Negative.   Gastrointestinal: Negative.   Musculoskeletal:  Positive for joint pain.  Neurological: Negative.   Psychiatric/Behavioral: Negative.    All other systems reviewed and are negative.   Labs/Other Tests and Data Reviewed:    Recent Labs: 08/03/2020: TSH 1.020 09/24/2020: ALT 14 12/08/2020: BUN 18; Creatinine 0.9; Hemoglobin 10.6; Platelets 289; Potassium 4.1; Sodium 139   Recent Lipid Panel Lab Results  Component Value Date/Time   CHOL 289 (H) 08/03/2020 10:12 AM   TRIG 193 (H) 08/03/2020 10:12 AM   HDL 65  08/03/2020 10:12 AM   CHOLHDL 4.4 08/03/2020 10:12 AM   CHOLHDL 3.6 05/31/2018 04:52 AM   LDLCALC 188 (H) 08/03/2020 10:12 AM    Wt Readings from Last 3 Encounters:  02/08/21 212 lb 4 oz (96.3 kg)  02/08/21 213 lb 3.2 oz (96.7 kg)  01/14/21 214 lb 6 oz (97.2 kg)     Exam:    BP 120/60 (BP Location: Left Arm, Patient Position: Sitting, Cuff Size: Normal)   Pulse (!) 114   Ht 5\' 6"  (1.676 m)   Wt 212 lb 4 oz (96.3 kg)   SpO2 97%   BMI 34.26 kg/m  Constitutional:  oriented to person, place, and time. No distress.  HENT:  Head: Grossly normal Eyes:  no discharge. No scleral icterus.  Neck: No JVD, no carotid bruits  Cardiovascular: Regular rate and rhythm, no murmurs appreciated Pulmonary/Chest: Clear to auscultation bilaterally, no wheezes or rails Abdominal: Soft.  no distension.  no tenderness.  Musculoskeletal: Normal range of motion Neurological:  normal muscle tone. Coordination normal. No atrophy Skin: Skin warm and dry Psychiatric: normal affect, pleasant   ASSESSMENT & PLAN:    Paroxysmal atrial fibrillation (HCC) -  Remains in atrial fibrillation Reports compliance with her Eliquis 5 twice daily We recommend she increase amiodarone to 200 twice daily, She is currently taking bisoprolol 5 twice daily, rate continues to run high Recommend she try 7.5 twice daily bisoprolol We will arrange cardioversion next week Reports that she does not like taking medications.  Stressed importance of taking her medications at this time  Chronic diastolic CHF (congestive heart failure) (HCC) Exacerbated by atrial fibrillation with rapid rate Recommend she stay on the Lasix 40 daily, extra Lasix 40 mg after lunch for worsening leg swelling abdominal distention or shortness of breath  Leg edema We will avoid calcium channel blockers Lasix 40 daily, extra Lasix after lunch for worsening ankle swelling   Centrilobular emphysema (HCC)  Prior history of smoking Followed by  pulmonary, No recent COPD exacerbations  Sleep apnea Poorly controlled, unable to tolerate CPAP Recommend she talk with ENT for other treatment options  Pulmonary hypertension, unspecified ([place]) Moderate pulmonary hypertension seen on prior echocardiogram Continue Lasix 40 daily Likely exacerbated by atrial fibrillation  Back pain, knee pain Weight loss, walking program  Total encounter time more than 25 minutes  Greater than 50% was spent in counseling and coordination of care with the patient     Signed, Ida Rogue, MD  02/08/2021 5:52 PM    [place] Office [address] [place] [address], [place],  [address]

## 2021-02-05 NOTE — H&P (View-Only) (Signed)
Date:  02/08/2021   ID:  Lauren Lloyd, Lauren Lloyd 1940/07/24, MRN [id number]  Patient Location:  [address] [place] [address]   Provider location:   [facility], [place] [facility]  PCP:  [name], MD  Cardiologist:  Arvid [facility]  Chief Complaint  Patient presents with   2 week follow up     Patient c/o shortness of breath, fatigue and having A-fib, would like to discuss having the cardioversion. Medications reviewed by the patient verbally.     History of Present Illness:    Lauren Lloyd is a 80 y.o. female  past medical history of Aortic atherosclerosis on CT scan 2020 hypertension,  asthma  Chronic lower extremity swelling Hospital admission for atrial fibrillation December 2019 Associated acute diastolic CHF Status post successful DCCV on 06/15/2018.  NSR on 06/2018 stress test, 07/2018 Showing no significant ischemia Mild lower extremity swelling, chronic Who presents for hospital follow-up, follow-up of her atrial fibrillation and acute on chronic diastolic CHF  Last seen in clinic December 16, 2020, at that time noted to be in atrial fibrillation She feels atrial fibrillation started June 2022 after her knee surgery Back to the clinic today after completing course of anticoagulation Interested in restoring normal sinus rhythm, tolerating amiodarone 200 daily, bisoprolol 5 twice daily Reports heart rate continues to run 120 bpm Denies missing any of her Eliquis  Can not tolerate CPAP, mask does not stay on, takes it off Sleeping poorly, wonders if other modalities for sleep apnea might work  Followed by [employer], pulmonary.    History of back pain, has received cortisone injections Followed by Dr. Sharlet Salina  EKG personally reviewed by myself on todays visit Shows atrial fibrillation per R wave progression to the anterior precordial leads with ventricular rate of 114 bpm no significant ST-T wave changes, PVCs  Other past medical  history reviewed  Nov 2020, developed atrial fib Started on amiodarone, higher dose metoprolol Lasix increased,40 BID 3 days then back to 40 daily She continued on 40 BID  She presented to the clinic and had stopped her amiodarone  She was still in normal sinus rhythm but she had worsening shortness of breath  She reported very high fluid intake  REDS vest 40% on 04/16/2019, goal less than 36  stress test, 07/2018 Showing no significant ischemia normal ejection fraction normal perfusion  Presented to the hospital 04/2018 with worsening shortness of breath, leg swelling, weight gain, abdominal bloating and new atrial fibrillation with RVR Initially on Cardizem infusion, changed to oral Cardizem with titration upwards Discharged on Cardizem CD 300 mg and metoprolol 25 mg bid, Eliquis. Treated with IV Lasix during her hospital course with marked improvement in her shortness of breath   Carotid ultrasound January 2020 Less than 39% disease bilaterally   Past Medical History:  Diagnosis Date   (HFpEF) heart failure with preserved ejection fraction ([place])    a. 05/2018 Echo: EF 55-60%, no rwma, mild to mod MR. Nl RV fxn. Mod TR. PASP 35mmHg.   Arthritis    knees, Hands   Arthritis of knee    Back pain    Carotid arterial disease ([place])    a. 03/2019 Carotid U/S: <50% bilat ICA stenoses.   Cholelithiasis    a. 10/2018 noted on CT.   Edema, lower extremity    Fatty liver    GERD (gastroesophageal reflux disease)    History of stress test    a. 06/2018 MV:  EF 59%, no ischemia/infarct. Low risk.   Knee pain    Lactose intolerance    Mitral regurgitation    a. 05/2018 Echo: mild to mod MR.   Multinodular goiter    Obesity    OSA (obstructive sleep apnea)    PAF (paroxysmal atrial fibrillation) ([place])    a.  Diagnosed 12/19; b. 05/2018 s/p DCCV; c. 03/2019 & 05/2019 recurrent AFib-->managed w/ amio load; d. CHADS2VASc = 6 (CHF, HTN, age x 2, vascular disease, female)-->Eliquis & amio 100 qd.    PAH (pulmonary artery hypertension) (HCC)    Scoliosis    SOB (shortness of breath)    Swallowing difficulty    Past Surgical History:  Procedure Laterality Date   CARDIOVERSION N/A 06/15/2018   Procedure: CARDIOVERSION (CATH LAB);  Surgeon: Minna Merritts, MD;  Location: [facility];  Service: Cardiovascular;  Laterality: N/A;   CATARACT EXTRACTION W/PHACO Right 01/25/2016   Procedure: CATARACT EXTRACTION PHACO AND INTRAOCULAR LENS PLACEMENT ([place]);  Surgeon: Ronnell Freshwater, MD;  Location: [place];  Service: Ophthalmology;  Laterality: Right;  RIGHT   CATARACT EXTRACTION W/PHACO Left 02/22/2016   Procedure: CATARACT EXTRACTION PHACO AND INTRAOCULAR LENS PLACEMENT ([place]);  Surgeon: Ronnell Freshwater, MD;  Location: [place];  Service: Ophthalmology;  Laterality: Left;  LEFT   HAMMER TOE SURGERY  05/16/2008   KNEE ARTHROSCOPY Right 05/16/2002   REPLACEMENT TOTAL KNEE Right 05/17/2007   [facility]   SKIN GRAFT Left 04/08/2013   Done on left index finger   TONSILLECTOMY  05/16/1944   TOTAL KNEE ARTHROPLASTY Left 12/02/2020     Current Meds  Medication Sig   apixaban (ELIQUIS) 5 MG TABS tablet Take 1 tablet (5 mg total) by mouth 2 (two) times daily.   calcium carbonate (TUMS EX) 750 MG chewable tablet Chew by mouth.   Cholecalciferol 25 MCG (1000 UT) tablet Take by mouth.   cyanocobalamin 1000 MCG tablet Take by mouth.   furosemide (LASIX) 40 MG tablet Take 1 tablet (40 mg total) by mouth daily. Take extra 40 mg as needed after lunch for abdominal swelling, leg swelling, or shortness of breath   loperamide (IMODIUM) 2 MG capsule Take 2 mg by mouth as needed.    metaxalone (SKELAXIN) 800 MG tablet Take 800 mg by mouth 3 (three) times daily.    Multiple Vitamin (MULTIVITAMIN) capsule Take 1 capsule by mouth daily.   potassium chloride (KLOR-CON) 10 MEQ tablet Take 1 tablet (10 mEq total) by mouth daily.   traZODone (DESYREL) 50  MG tablet Take 0.5-1 tablets (25-50 mg total) by mouth at bedtime as needed for sleep.   [DISCONTINUED] amiodarone (PACERONE) 200 MG tablet Take 1 tablet (200 mg total) by mouth daily.   [DISCONTINUED] bisoprolol (ZEBETA) 5 MG tablet Take 1 tablet (5 mg total) by mouth daily. May take extra 2.5 up to 5 mg in the pm for HR >90     Allergies:   Levofloxacin, Influenza vaccines, and Oysters [shellfish allergy]   Social History   Tobacco Use   Smoking status: Former    Packs/day: 1.00    Years: 30.00    Pack years: 30.00    Types: Cigarettes    Quit date: 05/16/1989    Years since quitting: 31.7   Smokeless tobacco: Never  Vaping Use   Vaping Use: Never used  Substance Use Topics   Alcohol use: Yes    Alcohol/week: 7.0 standard drinks    Types: 7 Glasses of wine  per week    Comment: 0-2 a night   Drug use: No     Family Hx: [name] family history includes Atrial fibrillation in her sister and sister; Breast cancer (age of onset: 56) in her sister; Healthy in her brother; Heart attack in her father; Heart disease in her mother; High blood pressure in her father; Hyperlipidemia in her sister and sister; Stroke in her father; Transient ischemic attack in her mother.  ROS:   Please see the history of present illness.    Review of Systems  Constitutional: Negative.   HENT: Negative.    Respiratory:  Positive for shortness of breath.   Cardiovascular: Negative.   Gastrointestinal: Negative.   Musculoskeletal:  Positive for joint pain.  Neurological: Negative.   Psychiatric/Behavioral: Negative.    All other systems reviewed and are negative.   Labs/Other Tests and Data Reviewed:    Recent Labs: 08/03/2020: TSH 1.020 09/24/2020: ALT 14 12/08/2020: BUN 18; Creatinine 0.9; Hemoglobin 10.6; Platelets 289; Potassium 4.1; Sodium 139   Recent Lipid Panel Lab Results  Component Value Date/Time   CHOL 289 (H) 08/03/2020 10:12 AM   TRIG 193 (H) 08/03/2020 10:12 AM   HDL 65  08/03/2020 10:12 AM   CHOLHDL 4.4 08/03/2020 10:12 AM   CHOLHDL 3.6 05/31/2018 04:52 AM   LDLCALC 188 (H) 08/03/2020 10:12 AM    Wt Readings from Last 3 Encounters:  02/08/21 212 lb 4 oz (96.3 kg)  02/08/21 213 lb 3.2 oz (96.7 kg)  01/14/21 214 lb 6 oz (97.2 kg)     Exam:    BP 120/60 (BP Location: Left Arm, Patient Position: Sitting, Cuff Size: Normal)   Pulse (!) 114   Ht 5\' 6"  (1.676 m)   Wt 212 lb 4 oz (96.3 kg)   SpO2 97%   BMI 34.26 kg/m  Constitutional:  oriented to person, place, and time. No distress.  HENT:  Head: Grossly normal Eyes:  no discharge. No scleral icterus.  Neck: No JVD, no carotid bruits  Cardiovascular: Regular rate and rhythm, no murmurs appreciated Pulmonary/Chest: Clear to auscultation bilaterally, no wheezes or rails Abdominal: Soft.  no distension.  no tenderness.  Musculoskeletal: Normal range of motion Neurological:  normal muscle tone. Coordination normal. No atrophy Skin: Skin warm and dry Psychiatric: normal affect, pleasant   ASSESSMENT & PLAN:    Paroxysmal atrial fibrillation (HCC) -  Remains in atrial fibrillation Reports compliance with her Eliquis 5 twice daily We recommend she increase amiodarone to 200 twice daily, She is currently taking bisoprolol 5 twice daily, rate continues to run high Recommend she try 7.5 twice daily bisoprolol We will arrange cardioversion next week Reports that she does not like taking medications.  Stressed importance of taking her medications at this time  Chronic diastolic CHF (congestive heart failure) (HCC) Exacerbated by atrial fibrillation with rapid rate Recommend she stay on the Lasix 40 daily, extra Lasix 40 mg after lunch for worsening leg swelling abdominal distention or shortness of breath  Leg edema We will avoid calcium channel blockers Lasix 40 daily, extra Lasix after lunch for worsening ankle swelling   Centrilobular emphysema (HCC)  Prior history of smoking Followed by  pulmonary, No recent COPD exacerbations  Sleep apnea Poorly controlled, unable to tolerate CPAP Recommend she talk with ENT for other treatment options  Pulmonary hypertension, unspecified ([place]) Moderate pulmonary hypertension seen on prior echocardiogram Continue Lasix 40 daily Likely exacerbated by atrial fibrillation  Back pain, knee pain Weight loss, walking program  Total encounter time more than 25 minutes  Greater than 50% was spent in counseling and coordination of care with the patient     Signed, Ida Rogue, MD  02/08/2021 5:52 PM    [place] [facility] [address] [place] [id number], [place], [place] [address]

## 2021-02-06 ENCOUNTER — Encounter: Payer: Self-pay | Admitting: Family Medicine

## 2021-02-06 DIAGNOSIS — D6869 Other thrombophilia: Secondary | ICD-10-CM | POA: Diagnosis not present

## 2021-02-06 DIAGNOSIS — J449 Chronic obstructive pulmonary disease, unspecified: Secondary | ICD-10-CM | POA: Diagnosis not present

## 2021-02-06 DIAGNOSIS — K219 Gastro-esophageal reflux disease without esophagitis: Secondary | ICD-10-CM | POA: Diagnosis not present

## 2021-02-06 DIAGNOSIS — Z7722 Contact with and (suspected) exposure to environmental tobacco smoke (acute) (chronic): Secondary | ICD-10-CM | POA: Diagnosis not present

## 2021-02-06 DIAGNOSIS — I10 Essential (primary) hypertension: Secondary | ICD-10-CM | POA: Diagnosis not present

## 2021-02-06 DIAGNOSIS — Z008 Encounter for other general examination: Secondary | ICD-10-CM | POA: Diagnosis not present

## 2021-02-06 DIAGNOSIS — I4891 Unspecified atrial fibrillation: Secondary | ICD-10-CM | POA: Diagnosis not present

## 2021-02-06 DIAGNOSIS — I951 Orthostatic hypotension: Secondary | ICD-10-CM | POA: Diagnosis not present

## 2021-02-06 DIAGNOSIS — M1990 Unspecified osteoarthritis, unspecified site: Secondary | ICD-10-CM | POA: Diagnosis not present

## 2021-02-06 DIAGNOSIS — Z6836 Body mass index (BMI) 36.0-36.9, adult: Secondary | ICD-10-CM | POA: Diagnosis not present

## 2021-02-06 DIAGNOSIS — Z7901 Long term (current) use of anticoagulants: Secondary | ICD-10-CM | POA: Diagnosis not present

## 2021-02-06 DIAGNOSIS — F4321 Adjustment disorder with depressed mood: Secondary | ICD-10-CM | POA: Diagnosis not present

## 2021-02-08 ENCOUNTER — Other Ambulatory Visit: Payer: Self-pay

## 2021-02-08 ENCOUNTER — Ambulatory Visit: Payer: Medicare HMO | Admitting: Cardiovascular Disease

## 2021-02-08 ENCOUNTER — Encounter: Payer: Self-pay | Admitting: Cardiovascular Disease

## 2021-02-08 ENCOUNTER — Ambulatory Visit: Payer: Medicare HMO

## 2021-02-08 ENCOUNTER — Ambulatory Visit (INDEPENDENT_AMBULATORY_CARE_PROVIDER_SITE_OTHER): Payer: Medicare HMO | Admitting: Family Medicine

## 2021-02-08 ENCOUNTER — Encounter: Payer: Self-pay | Admitting: Family Medicine

## 2021-02-08 VITALS — BP 120/60 | HR 114 | Ht 66.0 in | Wt 212.2 lb

## 2021-02-08 VITALS — BP 100/66 | HR 68 | Temp 98.0°F | Resp 16 | Ht 66.0 in | Wt 213.2 lb

## 2021-02-08 DIAGNOSIS — J432 Centrilobular emphysema: Secondary | ICD-10-CM | POA: Diagnosis not present

## 2021-02-08 DIAGNOSIS — I10 Essential (primary) hypertension: Secondary | ICD-10-CM | POA: Diagnosis not present

## 2021-02-08 DIAGNOSIS — G8929 Other chronic pain: Secondary | ICD-10-CM | POA: Diagnosis not present

## 2021-02-08 DIAGNOSIS — Z01818 Encounter for other preprocedural examination: Secondary | ICD-10-CM | POA: Diagnosis not present

## 2021-02-08 DIAGNOSIS — I2510 Atherosclerotic heart disease of native coronary artery without angina pectoris: Secondary | ICD-10-CM | POA: Diagnosis not present

## 2021-02-08 DIAGNOSIS — I272 Pulmonary hypertension, unspecified: Secondary | ICD-10-CM

## 2021-02-08 DIAGNOSIS — Z79899 Other long term (current) drug therapy: Secondary | ICD-10-CM | POA: Diagnosis not present

## 2021-02-08 DIAGNOSIS — G4700 Insomnia, unspecified: Secondary | ICD-10-CM

## 2021-02-08 DIAGNOSIS — E669 Obesity, unspecified: Secondary | ICD-10-CM | POA: Diagnosis not present

## 2021-02-08 DIAGNOSIS — I5032 Chronic diastolic (congestive) heart failure: Secondary | ICD-10-CM

## 2021-02-08 DIAGNOSIS — M5441 Lumbago with sciatica, right side: Secondary | ICD-10-CM

## 2021-02-08 DIAGNOSIS — Z0000 Encounter for general adult medical examination without abnormal findings: Secondary | ICD-10-CM

## 2021-02-08 DIAGNOSIS — M5440 Lumbago with sciatica, unspecified side: Secondary | ICD-10-CM

## 2021-02-08 DIAGNOSIS — G4733 Obstructive sleep apnea (adult) (pediatric): Secondary | ICD-10-CM

## 2021-02-08 DIAGNOSIS — I4819 Other persistent atrial fibrillation: Secondary | ICD-10-CM

## 2021-02-08 DIAGNOSIS — I2584 Coronary atherosclerosis due to calcified coronary lesion: Secondary | ICD-10-CM

## 2021-02-08 DIAGNOSIS — Z01810 Encounter for preprocedural cardiovascular examination: Secondary | ICD-10-CM

## 2021-02-08 MED ORDER — AMIODARONE HCL 200 MG PO TABS: 200.0000 mg | ORAL_TABLET | Freq: Two times a day (BID) | ORAL | 3 refills | Status: DC

## 2021-02-08 MED ORDER — TRAZODONE HCL 50 MG PO TABS: 25.0000 mg | ORAL_TABLET | Freq: Every evening | ORAL | 3 refills | Status: DC | PRN

## 2021-02-08 MED ORDER — BISOPROLOL FUMARATE 5 MG PO TABS: 7.5000 mg | ORAL_TABLET | Freq: Two times a day (BID) | ORAL | 4 refills | Status: DC

## 2021-02-08 NOTE — Progress Notes (Signed)
Annual Wellness Visit     Patient: Lauren Lloyd, Female    DOB: 09/07/1940, 80 y.o.   MRN: [id number] Visit Date: 02/08/2021  Today's Provider: Lavon Paganini, MD   Chief Complaint  Patient presents with   Annual Exam   Subjective    Lauren Lloyd is a 80 y.o. female who presents today for her Annual Wellness Visit. She reports consuming a general diet. The patient does not participate in regular exercise at present. She generally feels fairly well. She reports sleeping poorly. She does have additional problems to discuss today.  Patient is C/O of worsening back pain and fatigue.  Chasnis for chronic low back pain. Has failed  ESI affecting quality of life  HPI Hypertension, follow-up  BP Readings from Last 3 Encounters:  02/08/21 100/66  01/14/21 128/78  12/16/20 110/70   Wt Readings from Last 3 Encounters:  02/08/21 213 lb 3.2 oz (96.7 kg)  01/14/21 214 lb 6 oz (97.2 kg)  12/16/20 213 lb (96.6 kg)     She was last seen for hypertension 6 months ago. (07/14/20) BP at that visit was 138/68. Management since that visit includes continue current medications.  She reports excellent compliance with treatment. She is not having side effects.  She is following a Regular diet. She is not exercising. She does not smoke.  Use of agents associated with hypertension: none.   Outside blood pressures are not being checked. Symptoms: No chest pain No chest pressure  No palpitations No syncope  No dyspnea No orthopnea  No paroxysmal nocturnal dyspnea Yes lower extremity edema   Pertinent labs: Lab Results  Component Value Date   CHOL 289 (H) 08/03/2020   HDL 65 08/03/2020   LDLCALC 188 (H) 08/03/2020   TRIG 193 (H) 08/03/2020   CHOLHDL 4.4 08/03/2020   Lab Results  Component Value Date   NA 136 09/24/2020   K 3.9 09/24/2020   CREATININE 0.89 09/24/2020   EGFR 58 (L) 08/03/2020   GFRNONAA >60 09/24/2020   GLUCOSE 127 (H) 09/24/2020     The ASCVD Risk  score (Arnett DK, et al., 2019) failed to calculate for the following reasons:   The 2019 ASCVD risk score is only valid for ages 64 to 10   ---------------------------------------------------------------------------------------------------  Lipid/Cholesterol, Follow-up  Last lipid panel Other pertinent labs  Lab Results  Component Value Date   CHOL 289 (H) 08/03/2020   HDL 65 08/03/2020   LDLCALC 188 (H) 08/03/2020   TRIG 193 (H) 08/03/2020   CHOLHDL 4.4 08/03/2020   Lab Results  Component Value Date   ALT 14 09/24/2020   AST 18 09/24/2020   PLT 278 09/24/2020   TSH 1.020 08/03/2020     She was last seen for this 6 months ago.  Management since that visit includes goal LDL <100.  She reports excellent compliance with treatment. She is not having side effects.   Symptoms: No chest pain No chest pressure/discomfort  No dyspnea Yes lower extremity edema  No numbness or tingling of extremity No orthopnea  Yes palpitations No paroxysmal nocturnal dyspnea  No speech difficulty No syncope   Current diet: in general, a "healthy" diet   Current exercise: none  The ASCVD Risk score (Arnett DK, et al., 2019) failed to calculate for the following reasons:   The 2019 ASCVD risk score is only valid for ages 59 to 5  ---------------------------------------------------------------------------------------------------    Medications: Outpatient Medications Prior to Visit  Medication Sig  amiodarone (PACERONE) 200 MG tablet Take 1 tablet (200 mg total) by mouth daily.   apixaban (ELIQUIS) 5 MG TABS tablet Take 1 tablet (5 mg total) by mouth 2 (two) times daily.   bisoprolol (ZEBETA) 5 MG tablet Take 1 tablet (5 mg total) by mouth daily. May take extra 2.5 up to 5 mg in the pm for HR >90   calcium carbonate (TUMS EX) 750 MG chewable tablet Chew by mouth.   Cholecalciferol 25 MCG (1000 UT) tablet Take by mouth.   cyanocobalamin 1000 MCG tablet Take by mouth.   furosemide (LASIX) 40  MG tablet Take 1 tablet (40 mg total) by mouth daily. Take extra 40 mg as needed after lunch for abdominal swelling, leg swelling, or shortness of breath   loperamide (IMODIUM) 2 MG capsule Take 2 mg by mouth as needed.    metaxalone (SKELAXIN) 800 MG tablet Take 800 mg by mouth 3 (three) times daily.    Multiple Vitamin (MULTIVITAMIN) capsule Take 1 capsule by mouth daily.   potassium chloride (KLOR-CON) 10 MEQ tablet Take 1 tablet (10 mEq total) by mouth daily.   [DISCONTINUED] traMADol (ULTRAM) 50 MG tablet Take 1 tablet (50 mg total) by mouth every 6 (six) hours as needed. (Patient not taking: Reported on 02/08/2021)   No facility-administered medications prior to visit.    Allergies  Allergen Reactions   Levofloxacin     Other reaction(s): Joint Pains Other reaction(s): Other (See Comments) Joint pain   Influenza Vaccines Other (See Comments)    Bell's Palsy   Oysters [Shellfish Allergy] Swelling    She states she had eaten them three days in a row and she developed swelling around her eyes.     Patient Care Team: [name], MD as PCP - General (Family Medicine) Minna Merritts, MD as PCP - Cardiology (Cardiology) Sharlet Salina, MD as Referring Physician (Physical Medicine and Rehabilitation) [place], Reliance Aleskerov, Luvenia Heller, MD as Consulting Physician (Pulmonary Disease) Dasher, Rayvon Char, MD (Dermatology) [name], DO as Referring Physician (Family Medicine) Germaine Pomfret, [facility] as Pharmacist (Pharmacist)  Review of Systems  Constitutional:  Positive for activity change and fatigue.  Cardiovascular:  Positive for palpitations and leg swelling.  Genitourinary:  Positive for enuresis.  Musculoskeletal:  Positive for back pain and myalgias.  Psychiatric/Behavioral:  Positive for sleep disturbance.   All other systems reviewed and are negative.       Objective    Vitals: BP 100/66 (BP Location: Left Arm, Patient Position: Sitting, Cuff  Size: Large)   Pulse 68   Temp 98 F (36.7 C) (Oral)   Resp 16   Ht '5\' 6"'  (1.676 m)   Wt 213 lb 3.2 oz (96.7 kg)   BMI 34.41 kg/m  BP Readings from Last 3 Encounters:  02/08/21 100/66  01/14/21 128/78  12/16/20 110/70   Wt Readings from Last 3 Encounters: Wt Readings from Last 3 Encounters:  02/08/21 213 lb 3.2 oz (96.7 kg)  01/14/21 214 lb 6 oz (97.2 kg)  12/16/20 213 lb (96.6 kg)     Physical Exam Vitals reviewed.  Constitutional:      General: She is not in acute distress.    Appearance: Normal appearance. She is well-developed. She is not diaphoretic.  HENT:     Head: Normocephalic and atraumatic.     Right Ear: Tympanic membrane, ear canal and external ear normal.     Left Ear: Tympanic membrane, ear canal and external ear normal.  Nose: Nose normal.     Mouth/Throat:     Mouth: Mucous membranes are moist.     Pharynx: Oropharynx is clear. No oropharyngeal exudate.  Eyes:     General: No scleral icterus.    Conjunctiva/sclera: Conjunctivae normal.     Pupils: Pupils are equal, round, and reactive to light.  Neck:     Thyroid: No thyromegaly.  Cardiovascular:     Rate and Rhythm: Normal rate. Rhythm irregularly irregular.     Pulses: Normal pulses.     Heart sounds: Normal heart sounds. No murmur heard. Pulmonary:     Effort: Pulmonary effort is normal. No respiratory distress.     Breath sounds: Normal breath sounds. No wheezing or rales.  Abdominal:     General: There is no distension.     Palpations: Abdomen is soft.     Tenderness: There is no abdominal tenderness.  Musculoskeletal:        General: No deformity.     Cervical back: Neck supple.     Right lower leg: No edema.     Left lower leg: No edema.  Lymphadenopathy:     Cervical: No cervical adenopathy.  Skin:    General: Skin is warm and dry.     Findings: No rash.  Neurological:     Mental Status: She is alert and oriented to person, place, and time. Mental status is at baseline.      Sensory: No sensory deficit.     Motor: No weakness.     Gait: Gait normal.  Psychiatric:        Mood and Affect: Mood normal.        Behavior: Behavior normal.        Thought Content: Thought content normal.    Most recent functional status assessment:  In your present state of health, do you have any difficulty performing the following activities: 02/08/2021  Hearing? N  Vision? N  Difficulty concentrating or making decisions? N  Walking or climbing stairs? Y  Dressing or bathing? N  Doing errands, shopping? N  Some recent data might be hidden   Most recent fall risk assessment: Fall Risk  02/08/2021  Falls in the past year? 0  Number falls in past yr: 0  Comment -  Injury with Fall? 0  Risk for fall due to : No Fall Risks  Follow up Falls evaluation completed    Most recent depression screenings: PHQ 2/9 Scores 02/08/2021 08/03/2020  PHQ - 2 Score 0 0  PHQ- 9 Score 3 6   Most recent cognitive screening: 6CIT Screen 02/08/2021  What Year? 0 points  What month? 0 points  What time? 0 points  Count back from 20 0 points  Months in reverse 0 points  Repeat phrase 0 points  Total Score 0   Most recent Audit-C alcohol use screening Alcohol Use Disorder Test (AUDIT) 08/03/2020  1. How often do you have a drink containing alcohol? 4  2. How many drinks containing alcohol do you have on a typical day when you are drinking? 0  3. How often do you have six or more drinks on one occasion? 0  AUDIT-C Score 4  4. How often during the last year have you found that you were not able to stop drinking once you had started? 0  5. How often during the last year have you failed to do what was normally expected from you because of drinking? 0  6. How often during the  last year have you needed a first drink in the morning to get yourself going after a heavy drinking session? 0  7. How often during the last year have you had a feeling of guilt of remorse after drinking? 0  8. How often  during the last year have you been unable to remember what happened the night before because you had been drinking? 0  9. Have you or someone else been injured as a result of your drinking? 0  10. Has a relative or friend or a doctor or another health worker been concerned about your drinking or suggested you cut down? 0  Alcohol Use Disorder Identification Test Final Score (AUDIT) 4  Alcohol Brief Interventions/Follow-up AUDIT Score <7 follow-up not indicated   A score of 3 or more in women, and 4 or more in men indicates increased risk for alcohol abuse, EXCEPT if all of the points are from question 1   No results found for any visits on 02/08/21.  Assessment & Plan     Annual wellness visit done today including the all of the following: Reviewed patient's Family Medical History Reviewed and updated list of patient's medical providers Assessment of cognitive impairment was done Assessed patient's functional ability Established a written schedule for health screening [place] Completed and Reviewed  Exercise Activities and Dietary recommendations  Goals      Chronic Care Management     CARE PLAN ENTRY (see longitudinal plan of care for additional care plan information)  Current Barriers:  Chronic Disease Management support, education, and care coordination needs related to Hypertension, Hyperlipidemia, and Atrial Fibrillation   Hypertension BP Readings from Last 3 Encounters:  12/23/19 (!) 138/48  12/09/19 (!) 140/80  11/08/19 110/62  Pharmacist Clinical Goal(s): Over the next 90 days, patient will work with PharmD and providers to maintain BP goal <140/90 Current regimen:  Bisprolol 2.78m twice daily Interventions: None Patient self care activities - Over the next 90 days, patient will: Check BP weekly, document, and provide at future appointments Ensure daily salt intake < 2300 mg/day  Hyperlipidemia Lab Results  Component Value Date/Time    LDLCALC 168 (H) 12/09/2019 02:11 PM  Pharmacist Clinical Goal(s): Over the next 90 days, patient will work with PharmD and providers to achieve LDL goal < 100 Current regimen:  None Interventions: None Patient self care activities - Over the next 90 days, patient will: Increase lifestyle modifications to lower cholesterol  Chronic Pain Pharmacist Clinical Goal(s) Over the next 90 days, patient will work with PharmD and providers to reduce pain Current regimen:  Gabapentin 3041mdaily Interventions: None Patient self care activities - Over the next 90 days, patient will: Report pain level to PharmD and provider Provide feedback on any medication changes to reduce pain  Medication management Pharmacist Clinical Goal(s): Over the next 90 days, patient will work with PharmD and providers to maintain optimal medication adherence Current pharmacy: [facility] Interventions Comprehensive medication review performed. Continue current medication management strategy Patient self care activities - Over the next 90 days, patient will: Focus on medication adherence by continuing current practices Take medications as prescribed Report any questions or concerns to PharmD and/or provider(s)     Reduce portion size     Recommend decreasing portion sizes for each meal (3) and add in 2 healthy snacks in between.         Immunization History  Administered Date(s) Administered   Hepatitis A 11/25/1999, 09/19/2001   Hepatitis A, Adult 11/25/1999, 09/19/2001  IPV 02/11/2000   PFIZER(Purple Top)SARS-COV-2 Vaccination 06/14/2019, 07/05/2019, 04/15/2020   Pneumococcal Conjugate-13 10/06/2014   Pneumococcal Polysaccharide-23 12/01/2010   Td 02/02/1998   Tdap 09/11/2007, 02/27/2012   Typhoid Inactivated 02/11/2000   Zoster Recombinat (Shingrix) 02/06/2020, 05/29/2020    Health Maintenance  Topic Date Due   COVID-19 Vaccine (4 - Booster for Pfizer series) 07/08/2020   INFLUENZA VACCINE   08/13/2021 (Originally 12/14/2020)   TETANUS/TDAP  02/26/2022   DEXA SCAN  Completed   Zoster Vaccines- Shingrix  Completed   HPV VACCINES  Aged Out     Discussed health benefits of physical activity, and encouraged her to engage in regular exercise appropriate for her age and condition.    Problem List Items Addressed This Visit       Cardiovascular and Mediastinum   Essential hypertension    Well controlled Continue current medications Reviewed recent metabolic panel F/u in 6 months       Chronic diastolic CHF (congestive heart failure) ([place])    F/b cardiology Euvolemic today Continue lasix, BB      Persistent atrial fibrillation ([place])    Upcoming appt with Cardiology this afternoon        Respiratory   OSA (obstructive sleep apnea)    Cannot tolerate CPAP Wants to consider Inspire Referral to ENT for further eval and discussion of options      Relevant Orders   Ambulatory referral to ENT     Nervous and Auditory   Chronic bilateral low back pain with sciatica    Failed multiple ESIs with PM&R Will refer to Dr Holley Raring for consideration of procedural pain management      Relevant Medications   traZODone (DESYREL) 50 MG tablet   Other Relevant Orders   Ambulatory referral to Pain Clinic     Other   Obesity (BMI 30-39.9)    Discussed importance of healthy weight management Discussed diet and exercise       Insomnia    Chronic and uncontrolled Likely 2/2 uncontrolled OSA and unable to tolerate CPAP Trial of trazodone      Other Visit Diagnoses     Encounter for annual wellness visit (AWV) in Medicare patient    -  Primary   Encounter for annual physical exam            Return in about 6 months (around 08/08/2021) for chronic disease f/u.     I, Lavon Paganini, MD, have reviewed all documentation for this visit. The documentation on 02/08/21 for the exam, diagnosis, procedures, and orders are all accurate and complete.   Chai Routh, Dionne Bucy, MD, MPH [facility]

## 2021-02-08 NOTE — Assessment & Plan Note (Signed)
Chronic and uncontrolled Likely 2/2 uncontrolled OSA and unable to tolerate CPAP Trial of trazodone

## 2021-02-08 NOTE — Assessment & Plan Note (Addendum)
Cannot tolerate CPAP Wants to consider Inspire Referral to ENT for further eval and discussion of options

## 2021-02-08 NOTE — Assessment & Plan Note (Signed)
Discussed importance of healthy weight management Discussed diet and exercise  

## 2021-02-08 NOTE — Assessment & Plan Note (Signed)
Upcoming appt with Cardiology this afternoon

## 2021-02-08 NOTE — Assessment & Plan Note (Signed)
Well controlled Continue current medications Reviewed recent metabolic panel F/u in 6 months  

## 2021-02-08 NOTE — Patient Instructions (Signed)
Medication Instructions:   Please continue bisoprolol 7.5 twice a day (1 1/2 pills )  Increase the amiodarone up to 200 mg twice a day  If you need a refill on your cardiac medications before your next appointment, please call your pharmacy.   Lab work: CBC & BMP Pre-procedural labs  Testing/Procedures: Cardioversion  Follow-Up: At [facility], you and your health needs are our priority.  As part of our continuing mission to provide you with exceptional heart care, we have created designated Provider Care Teams.  These Care Teams include your primary Cardiologist (physician) and Advanced Practice Providers (APPs -  Physician Assistants and Nurse Practitioners) who all work together to provide you with the care you need, when you need it.  You will need a follow up appointment in 1 month  Providers on your designated Care Team:   Murray Hodgkins, NP Christell Faith, PA-C Marrianne Mood, PA-C Cadence Augusta, [place]  COVID-19 Vaccine Information can be found at: [email or web address].uk For questions related to vaccine distribution or appointments, please email vaccine@[email or web address] .[email or web address] or call [phone].     Cardioversion  Date: 02/18/2021  Arrive at 06:30 am   Start time 07:30 am   with Dr. Rockey Situ  Please arrive at the [place] [facility], free valet parking is available  [address], [place], [place] [address]  INSTRUCTIONS:   Labs: CBC & BMP (within 30 days)  Nothing to eat or drink after midnight  May take your night time medications with a small sip of  water                  Medications:  HOLD: Lasix YOU MAY TAKE ALL of your remaining medications with a small amount of water. Continue your anticoagulant: Eliquis (no need to hold) You will need to continue your anticoagulant after your procedure until you are told by your provider that it is safe to stop  Must have a responsible person to drive you home. They  must stay in the waiting area during your procedure.  Failure to do so could result in cancellation.  Bring a current list of your medications and current insurance cards.   If you have any questions after you get home, please call the office at [phone]  FYI: For your safety, and to allow [name] to monitor your vital signs accurately during the surgery/procedure we request that  if you have artificial nails, gel coating, SNS etc. Please have those removed prior to your surgery/procedure. Not having the nail coverings /polish removed may result in cancellation or delay of your surgery/procedure.

## 2021-02-08 NOTE — Assessment & Plan Note (Signed)
F/b cardiology Euvolemic today Continue lasix, BB

## 2021-02-08 NOTE — Assessment & Plan Note (Signed)
Failed multiple ESIs with PM&R Will refer to Dr Holley Raring for consideration of procedural pain management

## 2021-02-11 ENCOUNTER — Other Ambulatory Visit: Payer: Self-pay | Admitting: Physical Medicine and Rehabilitation

## 2021-02-11 DIAGNOSIS — M25552 Pain in left hip: Secondary | ICD-10-CM | POA: Diagnosis not present

## 2021-02-11 DIAGNOSIS — M5416 Radiculopathy, lumbar region: Secondary | ICD-10-CM

## 2021-02-11 DIAGNOSIS — M5136 Other intervertebral disc degeneration, lumbar region: Secondary | ICD-10-CM | POA: Diagnosis not present

## 2021-02-11 DIAGNOSIS — M25551 Pain in right hip: Secondary | ICD-10-CM | POA: Diagnosis not present

## 2021-02-11 DIAGNOSIS — M48062 Spinal stenosis, lumbar region with neurogenic claudication: Secondary | ICD-10-CM | POA: Diagnosis not present

## 2021-02-11 DIAGNOSIS — M47816 Spondylosis without myelopathy or radiculopathy, lumbar region: Secondary | ICD-10-CM | POA: Diagnosis not present

## 2021-02-12 ENCOUNTER — Other Ambulatory Visit: Payer: Self-pay

## 2021-02-12 ENCOUNTER — Other Ambulatory Visit (INDEPENDENT_AMBULATORY_CARE_PROVIDER_SITE_OTHER): Payer: Medicare HMO

## 2021-02-12 DIAGNOSIS — Z01810 Encounter for preprocedural cardiovascular examination: Secondary | ICD-10-CM

## 2021-02-12 DIAGNOSIS — Z79899 Other long term (current) drug therapy: Secondary | ICD-10-CM | POA: Diagnosis not present

## 2021-02-13 LAB — BASIC METABOLIC PANEL
BUN/Creatinine Ratio: 21 (ref 12–28)
BUN: 20 mg/dL (ref 8–27)
CO2: 23 mmol/L (ref 20–29)
Calcium: 9.7 mg/dL (ref 8.7–10.3)
Chloride: 104 mmol/L (ref 96–106)
Creatinine, Ser: 0.96 mg/dL (ref 0.57–1.00)
Glucose: 107 mg/dL — ABNORMAL HIGH (ref 70–99)
Potassium: 4.4 mmol/L (ref 3.5–5.2)
Sodium: 144 mmol/L (ref 134–144)
eGFR: 59 mL/min/{1.73_m2} — ABNORMAL LOW (ref >59)

## 2021-02-13 LAB — CBC
Hematocrit: 42.4 % (ref 34.0–46.6)
Hemoglobin: 14.1 g/dL (ref 11.1–15.9)
MCH: 29.7 pg (ref 26.6–33.0)
MCHC: 33.3 g/dL (ref 31.5–35.7)
MCV: 90 fL (ref 79–97)
Platelets: 286 10*3/uL (ref 150–450)
RBC: 4.74 x10E6/uL (ref 3.77–5.28)
RDW: 11.8 % (ref 11.7–15.4)
WBC: 6.9 10*3/uL (ref 3.4–10.8)

## 2021-02-17 ENCOUNTER — Other Ambulatory Visit: Payer: Self-pay | Admitting: Cardiovascular Disease

## 2021-02-18 ENCOUNTER — Encounter
Admission: RE | Disposition: A | Discharge status: Home / Self Care | Payer: Medicare HMO | Source: Ambulatory Visit | Attending: Cardiovascular Disease

## 2021-02-18 ENCOUNTER — Telehealth: Payer: Self-pay | Admitting: Cardiovascular Disease

## 2021-02-18 ENCOUNTER — Ambulatory Visit: Payer: Medicare HMO | Admitting: Anesthesiology

## 2021-02-18 ENCOUNTER — Encounter: Payer: Self-pay | Admitting: Cardiovascular Disease

## 2021-02-18 ENCOUNTER — Ambulatory Visit
Admission: RE | Admit: 2021-02-18 | Discharge: 2021-02-18 | Disposition: A | Discharge status: Home / Self Care | Payer: Medicare HMO | Source: Ambulatory Visit | Attending: Cardiovascular Disease | Admitting: Cardiovascular Disease

## 2021-02-18 DIAGNOSIS — M25569 Pain in unspecified knee: Secondary | ICD-10-CM | POA: Diagnosis not present

## 2021-02-18 DIAGNOSIS — I110 Hypertensive heart disease with heart failure: Secondary | ICD-10-CM | POA: Insufficient documentation

## 2021-02-18 DIAGNOSIS — J432 Centrilobular emphysema: Secondary | ICD-10-CM | POA: Insufficient documentation

## 2021-02-18 DIAGNOSIS — R634 Abnormal weight loss: Secondary | ICD-10-CM | POA: Diagnosis not present

## 2021-02-18 DIAGNOSIS — M549 Dorsalgia, unspecified: Secondary | ICD-10-CM | POA: Insufficient documentation

## 2021-02-18 DIAGNOSIS — I700 Atherosclerosis of aorta: Secondary | ICD-10-CM | POA: Diagnosis not present

## 2021-02-18 DIAGNOSIS — Z7901 Long term (current) use of anticoagulants: Secondary | ICD-10-CM | POA: Insufficient documentation

## 2021-02-18 DIAGNOSIS — I5032 Chronic diastolic (congestive) heart failure: Secondary | ICD-10-CM | POA: Insufficient documentation

## 2021-02-18 DIAGNOSIS — Z96653 Presence of artificial knee joint, bilateral: Secondary | ICD-10-CM | POA: Insufficient documentation

## 2021-02-18 DIAGNOSIS — Z6832 Body mass index (BMI) 32.0-32.9, adult: Secondary | ICD-10-CM | POA: Diagnosis not present

## 2021-02-18 DIAGNOSIS — R600 Localized edema: Secondary | ICD-10-CM | POA: Insufficient documentation

## 2021-02-18 DIAGNOSIS — Z87891 Personal history of nicotine dependence: Secondary | ICD-10-CM | POA: Diagnosis not present

## 2021-02-18 DIAGNOSIS — I4819 Other persistent atrial fibrillation: Secondary | ICD-10-CM

## 2021-02-18 DIAGNOSIS — Z887 Allergy status to serum and vaccine status: Secondary | ICD-10-CM | POA: Diagnosis not present

## 2021-02-18 DIAGNOSIS — I4891 Unspecified atrial fibrillation: Secondary | ICD-10-CM | POA: Diagnosis not present

## 2021-02-18 DIAGNOSIS — Z881 Allergy status to other antibiotic agents status: Secondary | ICD-10-CM | POA: Insufficient documentation

## 2021-02-18 DIAGNOSIS — Z79899 Other long term (current) drug therapy: Secondary | ICD-10-CM | POA: Insufficient documentation

## 2021-02-18 DIAGNOSIS — G4733 Obstructive sleep apnea (adult) (pediatric): Secondary | ICD-10-CM | POA: Diagnosis not present

## 2021-02-18 HISTORY — PX: CARDIOVERSION: SHX1299

## 2021-02-18 SURGERY — CARDIOVERSION
Anesthesia: General

## 2021-02-18 MED ORDER — SODIUM CHLORIDE 0.9 % IV SOLN: INTRAVENOUS | Status: DC | PRN

## 2021-02-18 MED ORDER — PROPOFOL 10 MG/ML IV BOLUS
INTRAVENOUS | Status: DC | PRN
  Administered 2021-02-18: 50 mg via INTRAVENOUS

## 2021-02-18 MED ORDER — BISOPROLOL FUMARATE 5 MG PO TABS: 7.5000 mg | ORAL_TABLET | Freq: Two times a day (BID) | ORAL | 0 refills | Status: DC

## 2021-02-18 MED ORDER — PROPOFOL 10 MG/ML IV BOLUS
INTRAVENOUS | Status: AC
  Filled 2021-02-18: qty 20

## 2021-02-18 MED ORDER — SODIUM CHLORIDE 0.9 % IV SOLN
Freq: Once | INTRAVENOUS | Status: AC
  Administered 2021-02-18: 1000 mL via INTRAVENOUS

## 2021-02-18 NOTE — H&P (Signed)
H&P Addendum, pre-cardioversion ° °Patient was seen and evaluated prior to -cardioversion procedure °Symptoms, prior testing details again confirmed with the patient °Patient examined, no significant change from prior exam °Lab work reviewed in detail personally by myself °Patient understands risk and benefit of the procedure,  °The risks (stroke, cardiac arrhythmias rarely resulting in the need for a temporary or permanent pacemaker, skin irritation or burns and complications associated with conscious sedation including aspiration, arrhythmia, respiratory failure and death), benefits (restoration of normal sinus rhythm) and alternatives of a direct current cardioversion were explained in detail °Patient willing to proceed. ° °Signed, °Tim Julio Zappia, MD, Ph.D °[facility]  °

## 2021-02-18 NOTE — Telephone Encounter (Signed)
Requested Prescriptions   Signed Prescriptions Disp Refills   bisoprolol (ZEBETA) 5 MG tablet 270 tablet 0    Sig: Take 1.5 tablets (7.5 mg total) by mouth in the morning and at bedtime.    Authorizing Provider: Minna Merritts    Ordering User: Raelene Bott, Dequincy Born L   Medication was sent to incorrect pharmacy on 02/08/21. Spoke with patient and informed her that she may pick up Rx at [facility].

## 2021-02-18 NOTE — Telephone Encounter (Signed)
*  STAT* If patient is at the pharmacy, call can be transferred to refill team.   1. Which medications need to be refilled? (please list name of each medication and dose if known)  bisoprolol  2. Which pharmacy/location (including street and city if local pharmacy) is medication to be sent to? [facility] [place]   3. Do they need a 30 day or 90 day supply? 90    Patient cardioversion today rx not sent   Patient advised today

## 2021-02-18 NOTE — Anesthesia Preprocedure Evaluation (Addendum)
Anesthesia Evaluation  Patient identified by MRN, date of birth, ID band Patient awake    Reviewed: Allergy & Precautions, NPO status , Patient's Chart, lab work & pertinent test results  History of Anesthesia Complications Negative for: history of anesthetic complications  Airway Mallampati: III  TM Distance: >3 FB Neck ROM: Full    Dental no notable dental hx. (+) Teeth Intact   Pulmonary shortness of breath and with exertion, sleep apnea , COPD,  COPD inhaler, Patient abstained from smoking.Not current smoker, former smoker,    Pulmonary exam normal breath sounds clear to auscultation       Cardiovascular Exercise Tolerance: Good METShypertension, pulmonary hypertension+CHF  (-) CAD and (-) Past MI + dysrhythmias Atrial Fibrillation + Valvular Problems/Murmurs MR  Rhythm:Irregular Rate:Tachycardia - Systolic murmurs TTE 5102: Left ventricle:  Cavity size was normal, mild concentric LVH, systolic function normal  Estimate ejection fraction 55 to 60%  Wall motion was normal, no regional wall motion abnormalities  Unable to determine diastolic dysfunction parameters given arrhythmia   Mitral valve  Mild to moderate MR  Left atrium  Mild to moderately dilated   Right ventricle  Systolic function was normal   Tricuspid valve  Moderate tricuspid valve regurgitation   Pulmonary arteries  Main pulmonary artery was mildly dilated  Systolic pressure was moderately elevated with PA pressure estimated 51  mmHg    Neuro/Psych  Neuromuscular disease negative psych ROS   GI/Hepatic GERD  Medicated and Controlled,(+)     (-) substance abuse  ,   Endo/Other  neg diabetes  Renal/GU negative Renal ROS     Musculoskeletal   Abdominal   Peds  Hematology   Anesthesia Other Findings Past Medical History: No date: (HFpEF) heart failure with preserved ejection fraction ([place])     Comment:  a. 05/2018 Echo: EF 55-60%, no  rwma, mild to mod MR. Nl               RV fxn. Mod TR. PASP 44mmHg. No date: Arthritis     Comment:  knees, Hands No date: Arthritis of knee No date: Back pain No date: Carotid arterial disease ([place])     Comment:  a. 03/2019 Carotid U/S: <50% bilat ICA stenoses. No date: Cholelithiasis     Comment:  a. 10/2018 noted on CT. No date: Edema, lower extremity No date: Fatty liver No date: GERD (gastroesophageal reflux disease) No date: History of stress test     Comment:  a. 06/2018 MV: EF 59%, no ischemia/infarct. Low risk. No date: Knee pain No date: Lactose intolerance No date: Mitral regurgitation     Comment:  a. 05/2018 Echo: mild to mod MR. No date: Multinodular goiter No date: Obesity No date: OSA (obstructive sleep apnea) No date: PAF (paroxysmal atrial fibrillation) (HCC)     Comment:  a.  Diagnosed 12/19; b. 05/2018 s/p DCCV; c. 03/2019 &               05/2019 recurrent AFib-->managed w/ amio load; d.               CHADS2VASc = 6 (CHF, HTN, age x 2, vascular disease,               female)-->Eliquis & amio 100 qd. No date: PAH (pulmonary artery hypertension) (HCC) No date: Scoliosis No date: SOB (shortness of breath) No date: Swallowing difficulty  Reproductive/Obstetrics  Anesthesia Physical Anesthesia Plan  ASA: 3  Anesthesia Plan: General   Post-op Pain Management:    Induction: Intravenous  PONV Risk Score and Plan: 3 and Ondansetron, Propofol infusion and TIVA  Airway Management Planned: Nasal Cannula  Additional Equipment: None  Intra-op Plan:   Post-operative Plan:   Informed Consent: I have reviewed the patients History and Physical, chart, labs and discussed the procedure including the risks, benefits and alternatives for the proposed anesthesia with the patient or authorized representative who has indicated his/her understanding and acceptance.     Dental advisory given  Plan Discussed with: CRNA  and Surgeon  Anesthesia Plan Comments: (Discussed risks of anesthesia with patient, including possibility of difficulty with spontaneous ventilation under anesthesia necessitating airway intervention, PONV, and rare risks such as cardiac or respiratory or neurological events, and allergic reactions. Patient understands.)        Anesthesia Quick Evaluation

## 2021-02-18 NOTE — CV Procedure (Addendum)
Cardioversion procedure note For atrial fibrillation, persistent.  Procedure Details:  Consent: Risks of procedure as well as the alternatives and risks of each were explained to the (patient/caregiver).  Consent for procedure obtained.  Time Out: Verified patient identification, verified procedure, site/side was marked, verified correct patient position, special equipment/implants available, medications/allergies/relevent history reviewed, required imaging and test results available.  Performed  Patient placed on cardiac monitor, pulse oximetry, supplemental oxygen as necessary.   Sedation given: propofol IV, Dr. Bertell Maria Pacer pads placed anterior and posterior chest.   Cardioverted 2 time(s).   Cardioverted at  150J, 200J. Synchronized biphasic Converted to NSR   Evaluation: Findings: Post procedure EKG shows: NSR (sinus bradycardia, rate 45 to 50 bpm) Complications: None Patient did tolerate procedure well.  Time Spent Directly with the Patient:  56 minutes   Esmond Plants, M.D., Ph.D.

## 2021-02-18 NOTE — Interval H&P Note (Signed)
History and Physical Interval Note:  02/18/2021 2:43 PM  Lauren Lloyd  has presented today for surgery, with the diagnosis of Cardioversion   AFib.  The various methods of treatment have been discussed with the patient and family. After consideration of risks, benefits and other options for treatment, the patient has consented to  Procedure(s): CARDIOVERSION (N/A) as a surgical intervention.  [name] history has been reviewed, patient examined, no change in status, stable for surgery.  I have reviewed the patient's chart and labs.  Questions were answered to the patient's satisfaction.     Lauren Lloyd

## 2021-02-18 NOTE — Anesthesia Postprocedure Evaluation (Signed)
Anesthesia Post Note  Patient: Lauren Lloyd  Procedure(s) Performed: CARDIOVERSION  Patient location during evaluation: Specials Recovery Anesthesia Type: General Level of consciousness: awake and alert Pain management: pain level controlled Vital Signs Assessment: post-procedure vital signs reviewed and stable Respiratory status: spontaneous breathing, nonlabored ventilation, respiratory function stable and patient connected to nasal cannula oxygen Cardiovascular status: blood pressure returned to baseline and stable Postop Assessment: no apparent nausea or vomiting Anesthetic complications: no   No notable events documented.   Last Vitals:  Vitals:   02/18/21 0745 02/18/21 0800  BP: (!) 103/48 (!) 108/45  Pulse: (!) 43 (!) 43  Resp: 17 18  Temp:    SpO2: 98% 96%    Last Pain:  Vitals:   02/18/21 0800  TempSrc:   PainSc: 0-No pain                 Arita Miss

## 2021-02-18 NOTE — Transfer of Care (Signed)
Immediate Anesthesia Transfer of Care Note  Patient: Lauren Lloyd  Procedure(s) Performed: CARDIOVERSION  Patient Location: Short Stay  Anesthesia Type:General  Level of Consciousness: awake  Airway & Oxygen Therapy: Patient Spontanous Breathing and Patient connected to nasal cannula oxygen  Post-op Assessment: Report given to RN and Post -op Vital signs reviewed and stable  Post vital signs: Reviewed and stable  Last Vitals:  Vitals Value Taken Time  BP 115/60 02/18/21 0739  Temp    Pulse 46 02/18/21 0741  Resp 20 02/18/21 0741  SpO2 98 % 02/18/21 0741    Last Pain:  Vitals:   02/18/21 0702  TempSrc: Oral  PainSc: 0-No pain         Complications: No notable events documented.

## 2021-02-19 ENCOUNTER — Telehealth: Payer: Self-pay | Admitting: Cardiovascular Disease

## 2021-02-19 NOTE — Telephone Encounter (Signed)
Pt c/o medication issue:  1. Name of Medication: prednisone    2. How are you currently taking this medication (dosage and times per day)? New for back issues   3. Are you having a reaction (difficulty breathing--STAT)? No   4. What is your medication issue? Is this safe to take now given recent cardioversion    Patient calling to discuss starting   Also she feels like she may be back in afib .  She noticed HR slightly irregular on bp cuff ( 90's- 100's ) and some sob when going up stairs .

## 2021-02-19 NOTE — Telephone Encounter (Signed)
Spoke with the patient. Patient had a successful dccv yesterday. She is not able to tell when she is AFIB except that she will develop DOE. She denies chest pain, increased sob, light headiness/dizziness. She was advised to take it easy for the next couple of days so she has not done anything to exert herself. She has been monitoring her HR with her BP machine and it has been in the 90's.  Adv her to continue to monitor her HR. She should contact the office or our on call provider outside of office hours if her HR >110 bpm.  She has bee prescribed prednisone by her orthopedic surgeon and was instructed not to start it until after her dccv. She is having severe back pain and will mostly need another back surgery.  She would like Dr. Donivan Scull opinion on starting prednisone.  Advised the patient that I will fwd the update to Dr. Rockey Situ to advise.  Patient verbalized understanding and voiced appreciation for the call.

## 2021-02-23 ENCOUNTER — Other Ambulatory Visit: Payer: Self-pay

## 2021-02-23 ENCOUNTER — Ambulatory Visit
Admission: RE | Admit: 2021-02-23 | Discharge: 2021-02-23 | Disposition: A | Discharge status: Home / Self Care | Payer: Medicare HMO | Source: Ambulatory Visit | Attending: Physical Medicine and Rehabilitation | Admitting: Physical Medicine and Rehabilitation

## 2021-02-23 DIAGNOSIS — M5416 Radiculopathy, lumbar region: Secondary | ICD-10-CM | POA: Insufficient documentation

## 2021-02-23 DIAGNOSIS — M4726 Other spondylosis with radiculopathy, lumbar region: Secondary | ICD-10-CM | POA: Diagnosis not present

## 2021-02-23 DIAGNOSIS — M5117 Intervertebral disc disorders with radiculopathy, lumbosacral region: Secondary | ICD-10-CM | POA: Diagnosis not present

## 2021-02-23 DIAGNOSIS — M5115 Intervertebral disc disorders with radiculopathy, thoracolumbar region: Secondary | ICD-10-CM | POA: Diagnosis not present

## 2021-02-23 DIAGNOSIS — M5116 Intervertebral disc disorders with radiculopathy, lumbar region: Secondary | ICD-10-CM | POA: Diagnosis not present

## 2021-02-24 NOTE — Telephone Encounter (Signed)
Able to reach out to Lauren Lloyd, advised she may try Prednisone, but as it is a steroid has tendency to increase HR. Given her a-fib hx, recent cardioversion, and baseline HR 100-110, would be very cautious, if HR close to 120 or above, would stop Prednisone immediatly and advice ortho, steroids may not be suitable given cardiac hx.  Pt verbalized understanding, thankful for return call, will call back with any further concerns or if she returns to A-fib or high HR.

## 2021-02-24 NOTE — Telephone Encounter (Signed)
Patient calling back for an update.

## 2021-03-01 ENCOUNTER — Other Ambulatory Visit: Payer: Self-pay | Admitting: Cardiovascular Disease

## 2021-03-03 ENCOUNTER — Telehealth: Payer: Self-pay

## 2021-03-03 NOTE — Progress Notes (Signed)
Chronic Care Management Pharmacy Assistant   Name: Lauren Lloyd  MRN: [id number] DOB: 09-02-1940  Reason for Encounter:Hypertension Disease State Call.   Recent office visits:  02/08/2021 Dr. Brita Romp MD (PCP)Start traZODone (DESYREL) 50 MG tablet, Ambulatory referral to ENT, Ambulatory referral to Pain Clinic, Return in about 6 months   Recent consult visits:  02/11/2021 Sharlet Salina DO (Physical Medicine) Unable to see note 02/08/2021 Dr.Gollan MD (Cardiology) recommend she increase amiodarone to 200 twice daily, bisoprolol recommend she increase 7.5 twice daily   Hospital visits:  Medication Reconciliation was completed by comparing discharge summary, patient's EMR and Pharmacy list, and upon discussion with patient.  Admitted to the hospital on 02/18/2021 due to Cardioversion. Discharge date was 02/18/2021. Discharged from [facility]?Medications Started at [facility] Discharge:?? -started None  Medication Changes at Hospital Discharge: -Changed None  Medications Discontinued at Hospital Discharge: -Stopped None   Medications that remain the same after Hospital Discharge:??  -All other medications will remain the same.    Medications: Outpatient Encounter Medications as of 03/03/2021  Medication Sig   amiodarone (PACERONE) 200 MG tablet Take 1 tablet (200 mg total) by mouth 2 (two) times daily.   apixaban (ELIQUIS) 5 MG TABS tablet Take 1 tablet (5 mg total) by mouth 2 (two) times daily.   bisoprolol (ZEBETA) 5 MG tablet Take 1.5 tablets (7.5 mg total) by mouth in the morning and at bedtime.   calcium carbonate (TUMS EX) 750 MG chewable tablet Chew 2 tablets by mouth daily as needed for heartburn.   Cholecalciferol 25 MCG (1000 UT) tablet Take 1,000 Units by mouth daily.   cyanocobalamin 1000 MCG tablet Take 1,000 mcg by mouth daily.   furosemide (LASIX) 40 MG tablet Take 1 tablet (40 mg total) by mouth daily. Take extra 40 mg as needed  after lunch for abdominal swelling, leg swelling, or shortness of breath   loperamide (IMODIUM) 2 MG capsule Take 2 mg by mouth as needed for diarrhea or loose stools.   metaxalone (SKELAXIN) 800 MG tablet Take 800 mg by mouth daily as needed for muscle spasms.   Multiple Vitamin (MULTIVITAMIN) capsule Take 1 capsule by mouth daily.   potassium chloride (KLOR-CON) 10 MEQ tablet TAKE 1 TABLET BY MOUTH DAILY   traZODone (DESYREL) 50 MG tablet Take 0.5-1 tablets (25-50 mg total) by mouth at bedtime as needed for sleep. (Patient not taking: Reported on 02/18/2021)   No facility-administered encounter medications on file as of 03/03/2021.    Care Gaps: COVID-19 Vaccine (4- Booster for [employer] series)  Star Rating Drugs: None ID Medication Fill Gaps: Apixaban 5 MG last filled on 09/24/2020 90 day supply  Reviewed chart prior to disease state call. Spoke with patient regarding BP  Recent Office Vitals: BP Readings from Last 3 Encounters:  02/18/21 126/71  02/08/21 120/60  02/08/21 100/66   Pulse Readings from Last 3 Encounters:  02/18/21 (!) 46  02/08/21 (!) 114  02/08/21 68    Wt Readings from Last 3 Encounters:  02/18/21 200 lb (90.7 kg)  02/08/21 212 lb 4 oz (96.3 kg)  02/08/21 213 lb 3.2 oz (96.7 kg)     Kidney Function Lab Results  Component Value Date/Time   CREATININE 0.96 02/12/2021 09:37 AM   CREATININE 0.9 12/08/2020 12:00 AM   CREATININE 0.89 09/24/2020 09:49 AM   CREATININE 0.78 01/19/2017 03:22 PM   GFRNONAA 65 12/08/2020 12:00 AM   GFRNONAA >60 09/24/2020 09:49 AM  GFRNONAA 73 01/19/2017 03:22 PM   GFRAA 75 12/09/2019 02:11 PM   GFRAA 85 01/19/2017 03:22 PM    BMP Latest Ref Rng & Units 02/12/2021 12/08/2020 09/24/2020  Glucose 70 - 99 mg/dL 107(H) - 127(H)  BUN 8 - 27 mg/dL 20 18 21   Creatinine 0.57 - 1.00 mg/dL 0.96 0.9 0.89  BUN/Creat Ratio 12 - 28 21 - -  Sodium 134 - 144 mmol/L 144 139 136  Potassium 3.5 - 5.2 mmol/L 4.4 4.1 3.9  Chloride 96 - 106  mmol/L 104 105 102  CO2 20 - 29 mmol/L 23 26(A) 21(L)  Calcium 8.7 - 10.3 mg/dL 9.7 8.8 9.0    Current antihypertensive regimen:  Lasix 40 mg Daily for pulmonary hypertension Bisoprolol 5 mg take 1.5 tablets (7.5 mg total) by mouth in the morning and at bedtime.  Patient denies any headaches,dizziness or lightheadedness. Patient reports she is doing good with the blood pressure medications changes, and denies any issues at this time. How often are you checking your Blood Pressure? daily Current home BP readings:  Patient states her blood pressure is doing "good", and she is unsure what her readings are at this time. What recent interventions/DTPs have been made by any provider to improve Blood Pressure control since last CPP Visit:  02/08/2021 Dr.Gollan MD (Cardiology) recommend she increase amiodarone to 200 twice daily, bisoprolol recommend she increase 7.5 twice daily  Any recent hospitalizations or ED visits since last visit with CPP? Yes What diet changes have been made to improve Blood Pressure Control?  Patient denies any changes with her diet at this time. What exercise is being done to improve your Blood Pressure Control?  None ID  Adherence Review: Is the patient currently on ACE/ARB medication? No Does the patient have >5 day gap between last estimated fill dates? No    Anderson [place] Clinical Production designer, theatre/television/film [phone]

## 2021-03-04 DIAGNOSIS — M48062 Spinal stenosis, lumbar region with neurogenic claudication: Secondary | ICD-10-CM | POA: Diagnosis not present

## 2021-03-04 DIAGNOSIS — M5136 Other intervertebral disc degeneration, lumbar region: Secondary | ICD-10-CM | POA: Diagnosis not present

## 2021-03-04 DIAGNOSIS — M5416 Radiculopathy, lumbar region: Secondary | ICD-10-CM | POA: Diagnosis not present

## 2021-03-04 DIAGNOSIS — M160 Bilateral primary osteoarthritis of hip: Secondary | ICD-10-CM | POA: Diagnosis not present

## 2021-03-08 ENCOUNTER — Encounter: Payer: Self-pay | Admitting: Physician Assistant

## 2021-03-08 ENCOUNTER — Other Ambulatory Visit: Payer: Self-pay

## 2021-03-08 ENCOUNTER — Ambulatory Visit: Payer: Medicare HMO | Admitting: Physician Assistant

## 2021-03-08 VITALS — BP 126/80 | HR 94 | Ht 66.0 in | Wt 217.0 lb

## 2021-03-08 DIAGNOSIS — I5032 Chronic diastolic (congestive) heart failure: Secondary | ICD-10-CM

## 2021-03-08 DIAGNOSIS — I272 Pulmonary hypertension, unspecified: Secondary | ICD-10-CM | POA: Diagnosis not present

## 2021-03-08 DIAGNOSIS — I10 Essential (primary) hypertension: Secondary | ICD-10-CM

## 2021-03-08 DIAGNOSIS — I4819 Other persistent atrial fibrillation: Secondary | ICD-10-CM | POA: Diagnosis not present

## 2021-03-08 DIAGNOSIS — I5033 Acute on chronic diastolic (congestive) heart failure: Secondary | ICD-10-CM | POA: Diagnosis not present

## 2021-03-08 NOTE — Progress Notes (Signed)
Office Visit    Patient Name: Lauren Lloyd Date of Encounter: 03/08/2021  PCP:  [name], MD   [place]  Cardiologist:  Ida Rogue, MD  Advanced Practice Provider:  No care team member to display Electrophysiologist:  None  Chief Complaint    Chief Complaint  Patient presents with   Follow-up    s/p cardioversion    80 y.o. female with history of paroxysmal atrial fibrillation, HFpEF, hypertension, asthma, chronic lower extremity swelling, aortic atherosclerosis on CT, asthma, multilane nodular goiter, arthritis, and who presents today for follow-up of atrial fibrillation and HFpEF.  Past Medical History    Past Medical History:  Diagnosis Date   (HFpEF) heart failure with preserved ejection fraction ([place])    a. 05/2018 Echo: EF 55-60%, no rwma, mild to mod MR. Nl RV fxn. Mod TR. PASP 32mmHg.   Arthritis    knees, Hands   Arthritis of knee    Back pain    Carotid arterial disease ([place])    a. 03/2019 Carotid U/S: <50% bilat ICA stenoses.   Cholelithiasis    a. 10/2018 noted on CT.   Edema, lower extremity    Fatty liver    GERD (gastroesophageal reflux disease)    History of stress test    a. 06/2018 MV: EF 59%, no ischemia/infarct. Low risk.   Knee pain    Lactose intolerance    Mitral regurgitation    a. 05/2018 Echo: mild to mod MR.   Multinodular goiter    Obesity    OSA (obstructive sleep apnea)    PAF (paroxysmal atrial fibrillation) ([place])    a.  Diagnosed 12/19; b. 05/2018 s/p DCCV; c. 03/2019 & 05/2019 recurrent AFib-->managed w/ amio load; d. CHADS2VASc = 6 (CHF, HTN, age x 2, vascular disease, female)-->Eliquis & amio 100 qd.   PAH (pulmonary artery hypertension) (HCC)    Scoliosis    SOB (shortness of breath)    Swallowing difficulty    Past Surgical History:  Procedure Laterality Date   CARDIOVERSION N/A 06/15/2018   Procedure: CARDIOVERSION (CATH LAB);  Surgeon: Minna Merritts, MD;  Location: [facility];   Service: Cardiovascular;  Laterality: N/A;   CARDIOVERSION N/A 02/18/2021   Procedure: CARDIOVERSION;  Surgeon: Minna Merritts, MD;  Location: [facility];  Service: Cardiovascular;  Laterality: N/A;   CATARACT EXTRACTION W/PHACO Right 01/25/2016   Procedure: CATARACT EXTRACTION PHACO AND INTRAOCULAR LENS PLACEMENT ([place]);  Surgeon: Ronnell Freshwater, MD;  Location: [place];  Service: Ophthalmology;  Laterality: Right;  RIGHT   CATARACT EXTRACTION W/PHACO Left 02/22/2016   Procedure: CATARACT EXTRACTION PHACO AND INTRAOCULAR LENS PLACEMENT ([place]);  Surgeon: Ronnell Freshwater, MD;  Location: [place];  Service: Ophthalmology;  Laterality: Left;  LEFT   HAMMER TOE SURGERY  05/16/2008   KNEE ARTHROSCOPY Right 05/16/2002   REPLACEMENT TOTAL KNEE Right 05/17/2007   [facility]   SKIN GRAFT Left 04/08/2013   Done on left index finger   TONSILLECTOMY  05/16/1944   TOTAL KNEE ARTHROPLASTY Left 12/02/2020    Allergies  Allergies  Allergen Reactions   Levofloxacin     Other reaction(s): Joint Pains    Influenza Vaccines Other (See Comments)    Bell's Palsy   Oysters [Shellfish Allergy] Swelling    She states she had eaten them three days in a row and she developed swelling around her eyes.     History of Present Illness  Lauren Lloyd is a 80 y.o. female with PMH as above.  She has history of paroxysmal atrial fibrillation, HFpEF, hypertension, asthma, chronic LE E, aortic atherosclerosis by CT, pulmonary hypertension, asthma, multinodular goiter, and arthritis.  She was diagnosed with A. fib 04/2018.  She was placed on [place].  DCCV 05/2018.  Recurrent A. fib 03/2019.  She was started on amiodarone.  She converted to NSR but then stopped amiodarone by early December due to concerns concerning dyspnea.  Amiodarone was then resumed.  She is back in A. fib 05/2019.  She was loaded with additional amiodarone and converted back to NSR 08/2019.  Zio  monitoring 02/2020 without evidence of A. fib.  She had brief episodes of SVT.  She was last seen by her primary cardiologist 02/08/2021.  It was noted she was seen in clinic 8/3 and noted to be in A. fib.  She felt A. fib had started 10/2020 after her knee surgery.  She was started on anticoagulation and therapeutic with DCCV scheduled 02/18/2021.  It was noted she was unable to tolerate her CPAP mask and sleeping poorly.  On 02/18/2021 she underwent DCCV.  Today, 03/08/2021, she presents to clinic and is noted to be back in atrial fibrillation with ventricular rate 94 bpm.  She has noticed increased in swelling and that her weight is up 3 to 4 pounds from her baseline.  She has been experiencing orthopnea with worsening cough when laying flat.  She has noticed that she has been wheezing badly and short of breath, which she attributes to her atrial fibrillation.  She continues to sleep poorly.  She continues to be unable to wear her CPAP and has an appointment scheduled with ENT.  She reports recent cough with green sputum and is interested in knowing what OTC medications she can take with Coricidin recommended as an option.  She has no regular exercise routine.  She is unable to walk a block due to shortness of breath but denies any chest pain when walking or at rest.  She reports medication compliance.  No signs or symptoms of bleeding.  She brings her home BP cuff with her today, which is very consistent with the office cuff.  Office cuff reads 126/80 with home cuff 128/86.  She also brings with her a BP log with SBP 10 7-1 69 and BP lower with elevated rates.  Ventricular rates have varied from mid 90s to 110s.  Home Medications   Current Outpatient Medications  Medication Instructions   amiodarone (PACERONE) 200 mg, Oral, 2 times daily   apixaban (ELIQUIS) 5 mg, Oral, 2 times daily   bisoprolol (ZEBETA) 7.5 mg, Oral, 2 times daily   calcium carbonate (TUMS EX) 750 MG chewable tablet 2 tablets, Oral,  Daily PRN   Cholecalciferol 1,000 Units, Oral, Daily   cyanocobalamin 1,000 mcg, Oral, Daily   furosemide (LASIX) 40 mg, Oral, Daily, Take extra 40 mg as needed after lunch for abdominal swelling, leg swelling, or shortness of breath   loperamide (IMODIUM) 2 mg, Oral, As needed   metaxalone (SKELAXIN) 800 mg, Oral, Daily PRN   Multiple Vitamin (MULTIVITAMIN) capsule 1 capsule, Oral, Daily   potassium chloride (KLOR-CON) 10 MEQ tablet TAKE 1 TABLET BY MOUTH DAILY   traZODone (DESYREL) 25-50 mg, Oral, At bedtime PRN     Review of Systems    She reports a recent cough with green sputum, dyspnea when in A. fib with minimal activity, orthopnea with worsening cough when laying flat, poor  sleep/PND with inability to wear her CPAP, weight gain, increased edema.  She denies chest pain, palpitations, n, v, dizziness, syncope,  or early satiety.  All other systems reviewed and are otherwise negative except as noted above.  Physical Exam    VS:  BP 126/80 (BP Location: Left Arm, Patient Position: Sitting, Cuff Size: Large)   Pulse 94   Ht 5\' 6"  (1.676 m)   Wt 217 lb (98.4 kg)   SpO2 100%   BMI 35.02 kg/m  , BMI Body mass index is 35.02 kg/m. GEN: Well nourished, well developed, in no acute distress. HEENT: normal. Neck: Supple, no JVD, carotid bruits, or masses. Cardiac: IRIR, 2/6 systolic murmur, rubs, or gallops. No clubbing, cyanosis, mild bilateral edema.  Radials/DP/PT 2+ and equal bilaterally.  Respiratory:  Respirations regular and unlabored, right sided crackles. GI: Soft, nontender, nondistended, BS + x 4. MS: no deformity or atrophy. Skin: warm and dry, no rash. Neuro:  Strength and sensation are intact. Psych: Normal affect.  Accessory Clinical Findings    ECG personally reviewed by me today -atrial fibrillation, 94 bpm, poor R wave progression in 3, aVF, V1 through V3- no acute changes.  VITALS Reviewed today   Temp Readings from Last 3 Encounters:  02/18/21 98.1 F (36.7  C) (Oral)  02/08/21 98 F (36.7 C) (Oral)  09/24/20 98.4 F (36.9 C) (Oral)   BP Readings from Last 3 Encounters:  03/08/21 126/80  02/18/21 126/71  02/08/21 120/60   Pulse Readings from Last 3 Encounters:  03/08/21 94  02/18/21 (!) 46  02/08/21 (!) 114    Wt Readings from Last 3 Encounters:  03/08/21 217 lb (98.4 kg)  02/18/21 200 lb (90.7 kg)  02/08/21 212 lb 4 oz (96.3 kg)     LABS  reviewed today   Lab Results  Component Value Date   WBC 6.9 02/12/2021   HGB 14.1 02/12/2021   HCT 42.4 02/12/2021   MCV 90 02/12/2021   PLT 286 02/12/2021   Lab Results  Component Value Date   CREATININE 0.96 02/12/2021   BUN 20 02/12/2021   NA 144 02/12/2021   K 4.4 02/12/2021   CL 104 02/12/2021   CO2 23 02/12/2021   Lab Results  Component Value Date   ALT 14 09/24/2020   AST 18 09/24/2020   ALKPHOS 73 09/24/2020   BILITOT 0.9 09/24/2020   Lab Results  Component Value Date   CHOL 289 (H) 08/03/2020   HDL 65 08/03/2020   LDLCALC 188 (H) 08/03/2020   TRIG 193 (H) 08/03/2020   CHOLHDL 4.4 08/03/2020    Lab Results  Component Value Date   HGBA1C 5.5 12/09/2019   Lab Results  Component Value Date   TSH 1.020 08/03/2020     STUDIES/PROCEDURES reviewed today   Monitor 02/2020 Normal sinus rhythm avg HR of 61 bpm 20 Supraventricular Tachycardia/atrial tachycardia runs occurred, the run with the fastest interval lasting 5 beats with a max rate of 162 bpm, the longest lasting 16.7 secs with an avg rate of 94 bpm.  Isolated SVEs were rare (<1.0%), SVE Couplets were rare (<1.0%), and SVE Triplets were rare (<1.0%). Isolated VEs were rare (<1.0%), VE Couplets were rare (<1.0%), and no VE Triplets were present. Ventricular Bigeminy was present.  No patient triggered events recorded  MPI 07/12/2018 Pharmacological myocardial perfusion imaging study with no significant  ischemia Normal wall motion, EF estimated at 59% No EKG changes concerning for ischemia at peak  stress or in recovery.  Low risk scan  Carotids 05/2018 IMPRESSION: Less than 50% stenosis in the right and left internal carotid arteries.   Echo 05/2018 Left ventricle:  Cavity size was normal, mild concentric LVH, systolic function normal  Estimate ejection fraction 55 to 60%  Wall motion was normal, no regional wall motion abnormalities  Unable to determine diastolic dysfunction parameters given arrhythmia  Mitral valve  Mild to moderate MR  Left atrium  Mild to moderately dilated  Right ventricle  Systolic function was normal  Tricuspid valve  Moderate tricuspid valve regurgitation  Pulmonary arteries  Main pulmonary artery was mildly dilated  Systolic pressure was moderately elevated with PA pressure estimated 51  mmHg  Inferior vena cava  Dilated, respirophasic diameter changes were blunted less than 50%  consistent with elevated central venous pressure  Additional impressions  Arrhythmia noted, unable to exclude atrial fibrillation     Assessment & Plan    Persistent atrial fibrillation - Recurrent atrial fibrillation s/p DCCV 02/18/2021.  She reports dyspnea and symptoms consistent with overload in the setting.  Continue current bisoprolol and amiodarone 200 mg twice daily with ventricular rate well controlled.  Continue Eliquis 5 mg twice daily for anticoagulation.  She denies any signs or symptoms of bleeding.  Given her recurrent atrial fibrillation, EP referral provided today to discuss alternative antiarrhythmic therapy options versus ablation.  Will obtain an echo +/- further workup as outlined below.  Acute on chronic HFpEF --Volume up on exam with symptoms of worsening heart failure in the setting of recurrent atrial fibrillation.  Recommend 2 days of Lasix 80 mg daily with KCl tab 20 M EQ daily and then drop down to previous Lasix 40 mg daily with KCl tab 10 M EQ daily.  She will continue to take an extra 40 mg as needed for swelling in the afternoon.  As above,  recommend obtaining an echo with previous 2020 echo as above +/- further ischemic workup with MPI versus cath if needed pending those results and in preparation for her EP visit.  Essential hypertension --Current BP reasonably well controlled and matches her home machine closely.  We will continue her current medications with transient increase in diuresis as above.  HLD --Most recent labs as above with significantly elevated LDL. Recommend consider statin at RTC. Will defer for now and revisit with pt pending echo results +/- cath/MPI with recommendation to reassess at RTC.  Medication changes: Transient increase to Lasix 80 mg and KCl tab 20 M EQ x2 days then drop down to the previous dose.  Use Coricidin as OTC medication. Studies / Imaging/referral ordered: Echo (+/- further workup if needed).  EP referral for consideration of alternative antiarrhythmic therapy versus ablation Future considerations: Pending echo/EP recommendations. Consider statin at RTC. Disposition: RTC after her echo  *Please be aware that the above documentation was completed voice recognition software and may contain dictation errors.     Arvil Chaco, PA-C 03/08/2021

## 2021-03-08 NOTE — Patient Instructions (Signed)
Medication Instructions:  - Your physician has recommended you make the following change in your medication:   1) Lasix (furosemide) 40 mg: - take 2 tablets (80 mg) by mouth once daily x 2 days, - then resume 1 tablet (40 mg) by mouth once daily   2) Potassium 10 meq: - take 2 tablets (20 meq) by mouth once daily x 2 days,  - then resume 1 tablet (10 meq) by mouth once daily   3) Coricidin is a heart friendly OTC medication    *If you need a refill on your cardiac medications before your next appointment, please call your pharmacy*   Lab Work: - none ordered  If you have labs (blood work) drawn today and your tests are completely normal, you will receive your results only by: [place] (if you have MyChart) OR A paper copy in the mail If you have any lab test that is abnormal or we need to change your treatment, we will call you to review the results.   Testing/Procedures: 1) You have been referred to Dr. Lars Mage- consultation for atrial fibrillation  2) Your physician has requested that you have an echocardiogram. Echocardiography is a painless test that uses sound waves to create images of your heart. It provides your doctor with information about the size and shape of your heart and how well your heart's chambers and valves are working. This procedure takes approximately one hour. There are no restrictions for this procedure. There is a possibility that an IV may need to be started during your test to inject an image enhancing agent. This is done to obtain more optimal pictures of your heart. Therefore we ask that you do at least drink some water prior to coming in to hydrate your veins.     Follow-Up: At [facility], you and your health needs are our priority.  As part of our continuing mission to provide you with exceptional heart care, we have created designated Provider Care Teams.  These Care Teams include your primary Cardiologist (physician) and Advanced  Practice Providers (APPs -  Physician Assistants and Nurse Practitioners) who all work together to provide you with the care you need, when you need it.  We recommend signing up for the patient portal called "MyChart".  Sign up information is provided on this After Visit Summary.  MyChart is used to connect with patients for Virtual Visits (Telemedicine).  Patients are able to view lab/test results, encounter notes, upcoming appointments, etc.  Non-urgent messages can be sent to your provider as well.   To learn more about what you can do with MyChart, go to [email or web address].    Your next appointment:   After the echocardiogram is completed   The format for your next appointment:   In Person  Provider:   You may see Ida Rogue, MD or one of the following Advanced Practice Providers on your designated Care Team:   Murray Hodgkins, NP Christell Faith, PA-C Marrianne Mood, PA-C Cadence Kathlen Mody, [place]   Other Instructions  Echocardiogram An echocardiogram is a test that uses sound waves (ultrasound) to produce images of the heart. Images from an echocardiogram can provide important information about: Heart size and shape. The size and thickness and movement of your heart's walls. Heart muscle function and strength. Heart valve function or if you have stenosis. Stenosis is when the heart valves are too narrow. If blood is flowing backward through the heart valves (regurgitation). A tumor or infectious growth around the heart valves.  Areas of heart muscle that are not working well because of poor blood flow or injury from a heart attack. Aneurysm detection. An aneurysm is a weak or damaged part of an artery wall. The wall bulges out from the normal force of blood pumping through the body. Tell a health care provider about: Any allergies you have. All medicines you are taking, including vitamins, herbs, eye drops, creams, and over-the-counter medicines. Any blood disorders you  have. Any surgeries you have had. Any medical conditions you have. Whether you are pregnant or may be pregnant. What are the risks? Generally, this is a safe test. However, problems may occur, including an allergic reaction to dye (contrast) that may be used during the test. What happens before the test? No specific preparation is needed. You may eat and drink normally. What happens during the test?  You will take off your clothes from the waist up and put on a hospital gown. Electrodes or electrocardiogram (ECG)patches may be placed on your chest. The electrodes or patches are then connected to a device that monitors your heart rate and rhythm. You will lie down on a table for an ultrasound exam. A gel will be applied to your chest to help sound waves pass through your skin. A handheld device, called a transducer, will be pressed against your chest and moved over your heart. The transducer produces sound waves that travel to your heart and bounce back (or "echo" back) to the transducer. These sound waves will be captured in real-time and changed into images of your heart that can be viewed on a video monitor. The images will be recorded on a computer and reviewed by your health care provider. You may be asked to change positions or hold your breath for a short time. This makes it easier to get different views or better views of your heart. In some cases, you may receive contrast through an IV in one of your veins. This can improve the quality of the pictures from your heart. The procedure may vary among health care providers and hospitals. What can I expect after the test? You may return to your normal, everyday life, including diet, activities, and medicines, unless your health care provider tells you not to do that. Follow these instructions at home: It is up to you to get the results of your test. Ask your health care provider, or the department that is doing the test, when your results will  be ready. Keep all follow-up visits. This is important. Summary An echocardiogram is a test that uses sound waves (ultrasound) to produce images of the heart. Images from an echocardiogram can provide important information about the size and shape of your heart, heart muscle function, heart valve function, and other possible heart problems. You do not need to do anything to prepare before this test. You may eat and drink normally. After the echocardiogram is completed, you may return to your normal, everyday life, unless your health care provider tells you not to do that. This information is not intended to replace advice given to you by your health care provider. Make sure you discuss any questions you have with your health care provider. Document Revised: 12/24/2019 Document Reviewed: 12/24/2019 Elsevier Patient Education  2022 [employer].

## 2021-03-09 ENCOUNTER — Ambulatory Visit: Payer: Medicare HMO | Admitting: Physician Assistant

## 2021-03-09 ENCOUNTER — Encounter: Payer: Self-pay | Admitting: General Surgery

## 2021-03-10 ENCOUNTER — Encounter: Payer: Self-pay | Admitting: Physician Assistant

## 2021-03-10 ENCOUNTER — Telehealth: Payer: Self-pay

## 2021-03-10 DIAGNOSIS — G4733 Obstructive sleep apnea (adult) (pediatric): Secondary | ICD-10-CM | POA: Diagnosis not present

## 2021-03-10 NOTE — Telephone Encounter (Signed)
Copied from [place] [phone] [phone]. Topic: General - Other >> Mar 10, 2021 11:46 AM Pawlus, Brayton Layman A wrote: Reason for CRM: [facility] called requested the pts sleep study be faxed over - [phone]

## 2021-03-11 ENCOUNTER — Ambulatory Visit: Payer: Medicare HMO | Admitting: Physician Assistant

## 2021-03-11 NOTE — Telephone Encounter (Signed)
Faxed sleep study notes to [phone].

## 2021-03-25 DIAGNOSIS — M6281 Muscle weakness (generalized): Secondary | ICD-10-CM | POA: Diagnosis not present

## 2021-03-25 DIAGNOSIS — M48061 Spinal stenosis, lumbar region without neurogenic claudication: Secondary | ICD-10-CM | POA: Diagnosis not present

## 2021-03-29 ENCOUNTER — Telehealth: Payer: Self-pay

## 2021-03-29 ENCOUNTER — Other Ambulatory Visit: Payer: Self-pay | Admitting: Cardiovascular Disease

## 2021-03-29 NOTE — Progress Notes (Signed)
Chronic Care Management Pharmacy Assistant   Name: Lauren Lloyd  MRN: [id number] DOB: 1941/04/27  Reason for Encounter: Medication Review/Patient assistance application for Eliquis.    Recent office visits:  No recent office visit  Recent consult visits:  03/25/2021 Sharlet Salina DO (Physical Medicine)No medication Changes noted 03/08/2021 Marrianne Mood PA-C (Cardiology) Change Lasix 40 mg 2 tabletsby mouth once daily x 2 days,, then resume 1 tablet daily , Change Potassium 10 Meq 2 tablets once daily x 2 days, Ambulatory referral to cardiac electrophysiology. 03/04/2021 Sharlet Salina DO (Physical Medicine) No medication Changes noted, follow up in 6 weeks  Hospital visits:  None in previous 6 months  Medications: Outpatient Encounter Medications as of 03/29/2021  Medication Sig   amiodarone (PACERONE) 200 MG tablet Take 1 tablet (200 mg total) by mouth 2 (two) times daily.   bisoprolol (ZEBETA) 5 MG tablet Take 1.5 tablets (7.5 mg total) by mouth in the morning and at bedtime.   calcium carbonate (TUMS EX) 750 MG chewable tablet Chew 2 tablets by mouth daily as needed for heartburn.   Cholecalciferol 25 MCG (1000 UT) tablet Take 1,000 Units by mouth daily.   cyanocobalamin 1000 MCG tablet Take 1,000 mcg by mouth daily.   ELIQUIS 5 MG TABS tablet TAKE ONE TABLET BY MOUTH TWICE DAILY   furosemide (LASIX) 40 MG tablet Take 1 tablet (40 mg total) by mouth daily. Take extra 40 mg as needed after lunch for abdominal swelling, leg swelling, or shortness of breath   loperamide (IMODIUM) 2 MG capsule Take 2 mg by mouth as needed for diarrhea or loose stools.   metaxalone (SKELAXIN) 800 MG tablet Take 800 mg by mouth daily as needed for muscle spasms.   Multiple Vitamin (MULTIVITAMIN) capsule Take 1 capsule by mouth daily.   potassium chloride (KLOR-CON) 10 MEQ tablet TAKE 1 TABLET BY MOUTH DAILY   traZODone (DESYREL) 50 MG tablet Take 0.5-1 tablets (25-50 mg total) by mouth at  bedtime as needed for sleep.   No facility-administered encounter medications on file as of 03/29/2021.   Care Gaps: COVID-19 Vaccine (4- Booster for [employer] series)  Star Rating Drugs: None ID Medication Fill Gaps: Apixaban 5 MG last filled on 09/24/2020 90 day supply  Patient states she did not get a chance to see if she could receive patient assistance for Eliquis for year 2022 but she is  requesting that I start the application for patient assistance to see if she can for year 2023.Inform patient she may have to have a 3% out of pocket to be able to get approved.Patient states she would like to go ahead and start the process.   Spoke with the patient and she  requested that the application mailed.I informed  her once she receives the application she will need to complete her part of the application and return it to the Cardiology office for the provider to sign his part and  to fax it over  [facility] for processing.Informed patient to include a copy of her proof of income AND a copy of her Explanation of Benefits (EOB) statement from her insurance. The application will need to be faxed to [phone] and the phone number that can be called to check the status of the application will be [phone]. Patient verbalized understanding and was provided my phone number of [phone] if she has any questions.   Application emailed to Junius Argyle, CPP for review and to mail to patient home.   Anderson [place] Clinical  Pharmacist Assistant [phone]

## 2021-03-29 NOTE — Telephone Encounter (Signed)
Prescription refill request for Eliquis received. Indication: Atrial fib Last office visit: 03/08/21  Elenor Quinones PA-C Scr: 0.96 on 02/12/21 Age: 80 Weight: 98.4kg  Based on above findings Eliquis 5mg  twice daily is the appropriate dose.  Refill approved.

## 2021-03-29 NOTE — Telephone Encounter (Signed)
Refill request

## 2021-03-30 NOTE — Progress Notes (Signed)
Electrophysiology Office Note:    Date:  03/31/2021   ID:  Lauren Lloyd, DOB 1941-01-01, MRN [id number]  PCP:  [name], MD  [facility] Cardiologist:  Ida Rogue, MD  [facility] Electrophysiologist:  Vickie Epley, MD   Referring MD: Arvil Chaco, PA*   Chief Complaint: AF  History of Present Illness:    Lauren Lloyd is a 80 y.o. female who presents for an evaluation of AF at the request of Marrianne Mood, PA-C. Their medical history includes HFpEF, carotid artery disease, sleep apnea.  Patient was last seen by Geni Bers on March 08, 2021.  Her diagnosis of atrial fibrillation dates back to 2019.  She has had a cardioversion in the past in January 2020.  In November 2020 recurrent atrial fibrillation was noted.  She was started on amiodarone at that time.  The amiodarone was used intermittently.  She recently underwent a cardioversion on February 18, 2021.  When the patient saw Aldean Ast 24th she was back in A. fib and symptomatic.  She felt like it was contributing to decompensated diastolic heart failure. She is referred to discuss alternative antiarrhythmic drug versus possible catheter ablation for her symptomatic A. fib.  She tells me she is an active.  She tells me she is short of breath walking to the end of her driveway.  She is taking bisoprolol 5 mg twice a day instead of 7.5 mg twice a day.  She takes her Eliquis without bleeding issues.   Past Medical History:  Diagnosis Date   (HFpEF) heart failure with preserved ejection fraction ([place])    a. 05/2018 Echo: EF 55-60%, no rwma, mild to mod MR. Nl RV fxn. Mod TR. PASP 76mmHg.   Arthritis    knees, Hands   Arthritis of knee    Back pain    Carotid arterial disease ([place])    a. 03/2019 Carotid U/S: <50% bilat ICA stenoses.   Cholelithiasis    a. 10/2018 noted on CT.   Edema, lower extremity    Fatty liver    GERD (gastroesophageal reflux disease)    History of stress test    a.  06/2018 MV: EF 59%, no ischemia/infarct. Low risk.   Knee pain    Lactose intolerance    Mitral regurgitation    a. 05/2018 Echo: mild to mod MR.   Multinodular goiter    Obesity    OSA (obstructive sleep apnea)    PAF (paroxysmal atrial fibrillation) ([place])    a.  Diagnosed 12/19; b. 05/2018 s/p DCCV; c. 03/2019 & 05/2019 recurrent AFib-->managed w/ amio load; d. CHADS2VASc = 6 (CHF, HTN, age x 2, vascular disease, female)-->Eliquis & amio 100 qd.   PAH (pulmonary artery hypertension) (HCC)    Scoliosis    SOB (shortness of breath)    Swallowing difficulty     Past Surgical History:  Procedure Laterality Date   CARDIOVERSION N/A 06/15/2018   Procedure: CARDIOVERSION (CATH LAB);  Surgeon: Minna Merritts, MD;  Location: [facility];  Service: Cardiovascular;  Laterality: N/A;   CARDIOVERSION N/A 02/18/2021   Procedure: CARDIOVERSION;  Surgeon: Minna Merritts, MD;  Location: [facility];  Service: Cardiovascular;  Laterality: N/A;   CATARACT EXTRACTION W/PHACO Right 01/25/2016   Procedure: CATARACT EXTRACTION PHACO AND INTRAOCULAR LENS PLACEMENT ([place]);  Surgeon: Ronnell Freshwater, MD;  Location: [place];  Service: Ophthalmology;  Laterality: Right;  RIGHT   CATARACT EXTRACTION W/PHACO Left 02/22/2016   Procedure: CATARACT EXTRACTION PHACO  AND INTRAOCULAR LENS PLACEMENT (IOC);  Surgeon: Ronnell Freshwater, MD;  Location: [place];  Service: Ophthalmology;  Laterality: Left;  LEFT   HAMMER TOE SURGERY  05/16/2008   KNEE ARTHROSCOPY Right 05/16/2002   REPLACEMENT TOTAL KNEE Right 05/17/2007   [facility]   SKIN GRAFT Left 04/08/2013   Done on left index finger   TONSILLECTOMY  05/16/1944   TOTAL KNEE ARTHROPLASTY Left 12/02/2020    Current Medications: Current Meds  Medication Sig   amiodarone (PACERONE) 200 MG tablet Take 1 tablet (200 mg total) by mouth 2 (two) times daily.   bisoprolol (ZEBETA) 5 MG tablet Take 1.5 tablets (7.5 mg  total) by mouth in the morning and at bedtime.   calcium carbonate (TUMS EX) 750 MG chewable tablet Chew 2 tablets by mouth daily as needed for heartburn.   Cholecalciferol 25 MCG (1000 UT) tablet Take 1,000 Units by mouth daily.   cyanocobalamin 1000 MCG tablet Take 1,000 mcg by mouth daily.   ELIQUIS 5 MG TABS tablet TAKE ONE TABLET BY MOUTH TWICE DAILY   furosemide (LASIX) 40 MG tablet Take 1 tablet (40 mg total) by mouth daily. Take extra 40 mg as needed after lunch for abdominal swelling, leg swelling, or shortness of breath   loperamide (IMODIUM) 2 MG capsule Take 2 mg by mouth as needed for diarrhea or loose stools.   metaxalone (SKELAXIN) 800 MG tablet Take 800 mg by mouth daily as needed for muscle spasms.   Multiple Vitamin (MULTIVITAMIN) capsule Take 1 capsule by mouth daily.   potassium chloride (KLOR-CON) 10 MEQ tablet TAKE 1 TABLET BY MOUTH DAILY     Allergies:   Levofloxacin, Influenza vaccines, and Oysters [shellfish allergy]   Social History   Socioeconomic History   Marital status: Single    Spouse name: Not on file   Number of children: 5   Years of education: college   Highest education level: Bachelor's degree (e.g., BA, AB, BS)  Occupational History   Occupation: Retail banker: [employer]  Tobacco Use   Smoking status: Former    Packs/day: 1.00    Years: 30.00    Pack years: 30.00    Types: Cigarettes    Quit date: 05/16/1989    Years since quitting: 31.8   Smokeless tobacco: Never  Vaping Use   Vaping Use: Never used  Substance and Sexual Activity   Alcohol use: Yes    Alcohol/week: 7.0 standard drinks    Types: 7 Glasses of wine per week    Comment: 0-2 a night   Drug use: No   Sexual activity: Not Currently  Other Topics Concern   Not on file  Social History Narrative   Pt has a child who passed away at age 65   Social Determinants of Health   Financial Resource Strain: High Risk   Difficulty of Paying Living  Expenses: Hard  Food Insecurity: Not on file  Transportation Needs: No Transportation Needs   Lack of Transportation (Medical): No   Lack of Transportation (Non-Medical): No  Physical Activity: Not on file  Stress: Not on file  Social Connections: Not on file     Family History: [name] family history includes Atrial fibrillation in her sister and sister; Breast cancer (age of onset: 50) in her sister; Healthy in her brother; Heart attack in her father; Heart disease in her mother; High blood pressure in her father; Hyperlipidemia in her sister and sister;  Stroke in her father; Transient ischemic attack in her mother.  ROS:   Please see the history of present illness.    All other systems reviewed and are negative.  EKGs/Labs/Other Studies Reviewed:    The following studies were reviewed today:  January 2020 echo Mild to moderate MR Normal EF, 55% Normal RV Moderate TR Moderately elevated PA pressures  EKG:  The ekg ordered today demonstrates atrial fibrillation with a ventricular rate of 73 bpm.   Recent Labs: 08/03/2020: TSH 1.020 09/24/2020: ALT 14 02/12/2021: BUN 20; Creatinine, Ser 0.96; Hemoglobin 14.1; Platelets 286; Potassium 4.4; Sodium 144  Recent Lipid Panel    Component Value Date/Time   CHOL 289 (H) 08/03/2020 1012   TRIG 193 (H) 08/03/2020 1012   HDL 65 08/03/2020 1012   CHOLHDL 4.4 08/03/2020 1012   CHOLHDL 3.6 05/31/2018 0452   VLDL 21 05/31/2018 0452   LDLCALC 188 (H) 08/03/2020 1012    Physical Exam:    VS:  BP 140/76 (BP Location: Left Arm, Patient Position: Sitting, Cuff Size: Normal)   Pulse 73   Ht 5\' 6"  (1.676 m)   Wt 217 lb (98.4 kg)   SpO2 95%   BMI 35.02 kg/m     Wt Readings from Last 3 Encounters:  03/31/21 217 lb (98.4 kg)  03/08/21 217 lb (98.4 kg)  02/18/21 200 lb (90.7 kg)     GEN:  Well nourished, well developed in no acute distress.  Mildly short of breath.  Obese HEENT: Normal NECK: No JVD; No carotid  bruits LYMPHATICS: No lymphadenopathy CARDIAC: Irregularly irregular, no murmurs, rubs, gallops RESPIRATORY: Poor aeration to bilateral bases.  No wheezing or rhonchi.   ABDOMEN: Soft, non-tender, non-distended MUSCULOSKELETAL:  No edema; No deformity  SKIN: Warm and dry NEUROLOGIC:  Alert and oriented x 3 PSYCHIATRIC:  Normal affect       ASSESSMENT:    1. Persistent atrial fibrillation with RVR ([place])   2. Chronic diastolic CHF (congestive heart failure) ([place])   3. OSA (obstructive sleep apnea)   4. Centrilobular emphysema ([place])   5. Essential hypertension    PLAN:    In order of problems listed above:  1. Persistent atrial fibrillation with RVR (HCC) Continues to be in atrial fibrillation despite cardioversion and treatment with amiodarone 200 mg by mouth twice daily.  At this point the amiodarone has proven ineffective.  We will stop it today to avoid cumulative off target effects.  I would like to see her back in about 3 months to discuss Tikosyn loading.  We did discuss this during today's visit.  It seems that her sister is on the medication.  She will continue to take Eliquis for stroke risk reduction.  2. Chronic diastolic CHF (congestive heart failure) ([place]) Likely contributing to her shortness of breath.  Continue rate control for now.  Rhythm control would be ideal to help control her diastolic heart failure.  Continue Lasix.  She will work on reducing sodium intake.  3. OSA (obstructive sleep apnea)   4. Centrilobular emphysema ([place]) Also likely contributing to her shortness of breath.  Management per primary care physician.  5. Essential hypertension Controlled.  Continue current medications.   Follow-up 3 months with me to discuss Tikosyn loading.  Medication Adjustments/Labs and Tests Ordered: Current medicines are reviewed at length with the patient today.  Concerns regarding medicines are outlined above.  Orders Placed This Encounter  Procedures   EKG  12-Lead   No orders of the defined types  were placed in this encounter.    Signed, Hilton Cork. Quentin Ore, MD, [facility], [facility] 03/31/2021 9:59 AM    Electrophysiology [facility]

## 2021-03-31 ENCOUNTER — Encounter: Payer: Self-pay | Admitting: Cardiology

## 2021-03-31 ENCOUNTER — Other Ambulatory Visit: Payer: Self-pay

## 2021-03-31 ENCOUNTER — Ambulatory Visit: Payer: Medicare HMO | Admitting: Cardiology

## 2021-03-31 VITALS — BP 140/76 | HR 73 | Ht 66.0 in | Wt 217.0 lb

## 2021-03-31 DIAGNOSIS — G4733 Obstructive sleep apnea (adult) (pediatric): Secondary | ICD-10-CM | POA: Diagnosis not present

## 2021-03-31 DIAGNOSIS — I5032 Chronic diastolic (congestive) heart failure: Secondary | ICD-10-CM

## 2021-03-31 DIAGNOSIS — I4819 Other persistent atrial fibrillation: Secondary | ICD-10-CM

## 2021-03-31 DIAGNOSIS — I10 Essential (primary) hypertension: Secondary | ICD-10-CM

## 2021-03-31 DIAGNOSIS — J432 Centrilobular emphysema: Secondary | ICD-10-CM | POA: Diagnosis not present

## 2021-03-31 NOTE — Patient Instructions (Addendum)
Medication Instructions:  Your physician has recommended you make the following change in your medication:    STOP amiodarone  Lab Work: None ordered. If you have labs (blood work) drawn today and your tests are completely normal, you will receive your results only by: [place] (if you have MyChart) OR A paper copy in the mail If you have any lab test that is abnormal or we need to change your treatment, we will call you to review the results.  Testing/Procedures: None ordered.  Follow-Up: At [facility], you and your health needs are our priority.  As part of our continuing mission to provide you with exceptional heart care, we have created designated Provider Care Teams.  These Care Teams include your primary Cardiologist (physician) and Advanced Practice Providers (APPs -  Physician Assistants and Nurse Practitioners) who all work together to provide you with the care you need, when you need it.  Your next appointment:    July 07, 2021 at 9:00 am with Dr. Quentin Ore at the [facility]

## 2021-04-02 DIAGNOSIS — M48061 Spinal stenosis, lumbar region without neurogenic claudication: Secondary | ICD-10-CM | POA: Diagnosis not present

## 2021-04-13 DIAGNOSIS — M48061 Spinal stenosis, lumbar region without neurogenic claudication: Secondary | ICD-10-CM | POA: Diagnosis not present

## 2021-04-15 DIAGNOSIS — M48062 Spinal stenosis, lumbar region with neurogenic claudication: Secondary | ICD-10-CM | POA: Diagnosis not present

## 2021-04-15 DIAGNOSIS — M25551 Pain in right hip: Secondary | ICD-10-CM | POA: Diagnosis not present

## 2021-04-15 DIAGNOSIS — M5416 Radiculopathy, lumbar region: Secondary | ICD-10-CM | POA: Diagnosis not present

## 2021-04-15 DIAGNOSIS — M25552 Pain in left hip: Secondary | ICD-10-CM | POA: Diagnosis not present

## 2021-04-15 DIAGNOSIS — M62830 Muscle spasm of back: Secondary | ICD-10-CM | POA: Diagnosis not present

## 2021-04-15 DIAGNOSIS — M5136 Other intervertebral disc degeneration, lumbar region: Secondary | ICD-10-CM | POA: Diagnosis not present

## 2021-04-16 ENCOUNTER — Ambulatory Visit (INDEPENDENT_AMBULATORY_CARE_PROVIDER_SITE_OTHER): Payer: Medicare HMO

## 2021-04-16 ENCOUNTER — Other Ambulatory Visit: Payer: Self-pay

## 2021-04-16 DIAGNOSIS — I4819 Other persistent atrial fibrillation: Secondary | ICD-10-CM

## 2021-04-16 LAB — ECHOCARDIOGRAM COMPLETE
AR max vel: 1.65 cm2
AV Area VTI: 1.88 cm2
AV Area mean vel: 1.77 cm2
AV Mean grad: 3 mmHg
AV Peak grad: 6.3 mmHg
Ao pk vel: 1.25 m/s
Calc EF: 58.2 %
S' Lateral: 2.2 cm
Single Plane A2C EF: 58.5 %
Single Plane A4C EF: 58.5 %

## 2021-04-19 NOTE — Progress Notes (Signed)
Date:  04/21/2021   ID:  Lauren, Lloyd Aug 17, 1940, MRN [id number]  Patient Location:  [place] [id number]   Provider location:   [facility] [place] [facility]  PCP:  [name], MD  Cardiologist:  Arvid Right [facility]  Chief Complaint  Patient presents with   Follow up Echo     Patient c/o shortness of breath with little to no exertion. Medications reviewed by the patient verbally.     History of Present Illness:    Lauren Lloyd is a 80 y.o. female  past medical history of Aortic atherosclerosis on CT scan 2020 hypertension,  asthma  Chronic lower extremity swelling Hospital admission for atrial fibrillation December 2019 Associated acute diastolic CHF Status post successful DCCV on 06/15/2018.  NSR on 06/2018 stress test, 07/2018 Showing no significant ischemia Mild lower extremity swelling, chronic atrial fibrillation started June 2022 after her knee surgery Who presents for hospital follow-up, follow-up of her atrial fibrillation and acute on chronic diastolic CHF  Last seen in clinic 9/22  at that time noted to be in atrial fibrillation  underwent a cardioversion on February 18, 2021.  Was referred by one of our providers to EP Amiodarone was held, March 31, 2021 Planned f/u in feb 2022  Still with SOB on exertion Not walking secondary to back pain Stopped swimming at [employer]  Can not tolerate CPAP, mask does not stay on, takes it off Sleeping poorly, wonders if other modalities for sleep apnea might work  Followed by [facility], pulmonary.    History of back pain, has received cortisone injections Followed by Dr. Sharlet Salina  EKG personally reviewed by myself on todays visit Shows atrial fibrillation per R wave progression to the anterior precordial leads with ventricular rate of 79 bpm no significant ST-T wave changes,  No change  Other past medical history reviewed  Nov 2020, developed atrial  fib Started on amiodarone, higher dose metoprolol Lasix increased,40 BID 3 days then back to 40 daily She continued on 40 BID  She presented to the clinic and had stopped her amiodarone  She was still in normal sinus rhythm but she had worsening shortness of breath  She reported very high fluid intake  REDS vest 40% on 04/16/2019, goal less than 36  stress test, 07/2018 Showing no significant ischemia normal ejection fraction normal perfusion  Presented to the hospital 04/2018 with worsening shortness of breath, leg swelling, weight gain, abdominal bloating and new atrial fibrillation with RVR Initially on Cardizem infusion, changed to oral Cardizem with titration upwards Discharged on Cardizem CD 300 mg and metoprolol 25 mg bid, Eliquis. Treated with IV Lasix during her hospital course with marked improvement in her shortness of breath   Carotid ultrasound January 2020 Less than 39% disease bilaterally   Past Medical History:  Diagnosis Date   (HFpEF) heart failure with preserved ejection fraction ([place])    a. 05/2018 Echo: EF 55-60%, no rwma, mild to mod MR. Nl RV fxn. Mod TR. PASP 45mmHg.   Arthritis    knees, Hands   Arthritis of knee    Back pain    Carotid arterial disease ([place])    a. 03/2019 Carotid U/S: <50% bilat ICA stenoses.   Cholelithiasis    a. 10/2018 noted on CT.   Edema, lower extremity    Fatty liver    GERD (gastroesophageal reflux disease)    History of stress test    a. 06/2018 MV:  EF 59%, no ischemia/infarct. Low risk.   Knee pain    Lactose intolerance    Mitral regurgitation    a. 05/2018 Echo: mild to mod MR.   Multinodular goiter    Obesity    OSA (obstructive sleep apnea)    PAF (paroxysmal atrial fibrillation) ([place])    a.  Diagnosed 12/19; b. 05/2018 s/p DCCV; c. 03/2019 & 05/2019 recurrent AFib-->managed w/ amio load; d. CHADS2VASc = 6 (CHF, HTN, age x 2, vascular disease, female)-->Eliquis & amio 100 qd.   PAH (pulmonary artery hypertension) (HCC)     Scoliosis    SOB (shortness of breath)    Swallowing difficulty    Past Surgical History:  Procedure Laterality Date   CARDIOVERSION N/A 06/15/2018   Procedure: CARDIOVERSION (CATH LAB);  Surgeon: Minna Merritts, MD;  Location: [facility];  Service: Cardiovascular;  Laterality: N/A;   CARDIOVERSION N/A 02/18/2021   Procedure: CARDIOVERSION;  Surgeon: Minna Merritts, MD;  Location: [facility];  Service: Cardiovascular;  Laterality: N/A;   CATARACT EXTRACTION W/PHACO Right 01/25/2016   Procedure: CATARACT EXTRACTION PHACO AND INTRAOCULAR LENS PLACEMENT ([place]);  Surgeon: Ronnell Freshwater, MD;  Location: [place];  Service: Ophthalmology;  Laterality: Right;  RIGHT   CATARACT EXTRACTION W/PHACO Left 02/22/2016   Procedure: CATARACT EXTRACTION PHACO AND INTRAOCULAR LENS PLACEMENT ([place]);  Surgeon: Ronnell Freshwater, MD;  Location: [place];  Service: Ophthalmology;  Laterality: Left;  LEFT   HAMMER TOE SURGERY  05/16/2008   KNEE ARTHROSCOPY Right 05/16/2002   REPLACEMENT TOTAL KNEE Right 05/17/2007   [facility]   SKIN GRAFT Left 04/08/2013   Done on left index finger   TONSILLECTOMY  05/16/1944   TOTAL KNEE ARTHROPLASTY Left 12/02/2020     Current Meds  Medication Sig   bisoprolol (ZEBETA) 5 MG tablet Take 1.5 tablets (7.5 mg total) by mouth in the morning and at bedtime.   calcium carbonate (TUMS EX) 750 MG chewable tablet Chew 2 tablets by mouth daily as needed for heartburn.   celecoxib (CELEBREX) 200 MG capsule Take 200 mg by mouth as needed.   Cholecalciferol 25 MCG (1000 UT) tablet Take 1,000 Units by mouth daily.   cyanocobalamin 1000 MCG tablet Take 1,000 mcg by mouth daily.   ELIQUIS 5 MG TABS tablet TAKE ONE TABLET BY MOUTH TWICE DAILY   ezetimibe (ZETIA) 10 MG tablet Take 1 tablet (10 mg total) by mouth daily.   furosemide (LASIX) 40 MG tablet Take 1 tablet (40 mg total) by mouth daily. Take extra 40 mg as needed after  lunch for abdominal swelling, leg swelling, or shortness of breath   loperamide (IMODIUM) 2 MG capsule Take 2 mg by mouth as needed for diarrhea or loose stools.   metaxalone (SKELAXIN) 800 MG tablet Take 800 mg by mouth daily as needed for muscle spasms.   Multiple Vitamin (MULTIVITAMIN) capsule Take 1 capsule by mouth daily.   potassium chloride (KLOR-CON) 10 MEQ tablet TAKE 1 TABLET BY MOUTH DAILY   traZODone (DESYREL) 50 MG tablet Take 0.5-1 tablets (25-50 mg total) by mouth at bedtime as needed for sleep.     Allergies:   Levofloxacin, Influenza vaccines, and Oysters [shellfish allergy]   Social History   Tobacco Use   Smoking status: Former    Packs/day: 1.00    Years: 30.00    Pack years: 30.00    Types: Cigarettes    Quit date: 05/16/1989    Years since quitting: 54.9  Smokeless tobacco: Never  Vaping Use   Vaping Use: Never used  Substance Use Topics   Alcohol use: Yes    Alcohol/week: 7.0 standard drinks    Types: 7 Glasses of wine per week    Comment: 0-2 a night   Drug use: No     Family Hx: [name] family history includes Atrial fibrillation in her sister and sister; Breast cancer (age of onset: 67) in her sister; Healthy in her brother; Heart attack in her father; Heart disease in her mother; High blood pressure in her father; Hyperlipidemia in her sister and sister; Stroke in her father; Transient ischemic attack in her mother.  ROS:   Please see the history of present illness.    Review of Systems  Constitutional: Negative.   HENT: Negative.    Respiratory:  Positive for shortness of breath.   Cardiovascular: Negative.   Gastrointestinal: Negative.   Musculoskeletal:  Positive for joint pain.  Neurological: Negative.   Psychiatric/Behavioral: Negative.    All other systems reviewed and are negative.   Labs/Other Tests and Data Reviewed:    Recent Labs: 08/03/2020: TSH 1.020 09/24/2020: ALT 14 02/12/2021: BUN 20; Creatinine, Ser 0.96; Hemoglobin  14.1; Platelets 286; Potassium 4.4; Sodium 144   Recent Lipid Panel Lab Results  Component Value Date/Time   CHOL 289 (H) 08/03/2020 10:12 AM   TRIG 193 (H) 08/03/2020 10:12 AM   HDL 65 08/03/2020 10:12 AM   CHOLHDL 4.4 08/03/2020 10:12 AM   CHOLHDL 3.6 05/31/2018 04:52 AM   LDLCALC 188 (H) 08/03/2020 10:12 AM    Wt Readings from Last 3 Encounters:  04/20/21 220 lb (99.8 kg)  03/31/21 217 lb (98.4 kg)  03/08/21 217 lb (98.4 kg)     Exam:    BP 112/64 (BP Location: Left Arm, Patient Position: Sitting, Cuff Size: Normal)   Pulse 79   Ht 5' 6.5" (1.689 m)   Wt 220 lb (99.8 kg)   BMI 34.98 kg/m  Constitutional:  oriented to person, place, and time. No distress.  HENT:  Head: Grossly normal Eyes:  no discharge. No scleral icterus.  Neck: No JVD, no carotid bruits  Cardiovascular: Irregularly irregular, no murmurs appreciated Pulmonary/Chest: Clear to auscultation bilaterally, no wheezes or rails Abdominal: Soft.  no distension.  no tenderness.  Musculoskeletal: Normal range of motion Neurological:  normal muscle tone. Coordination normal. No atrophy Skin: Skin warm and dry Psychiatric: normal affect, pleasant    ASSESSMENT & PLAN:    Paroxysmal atrial fibrillation (HCC) -  Remains in atrial fibrillation Has seen EP, amiodarone was held plan for 75-month washout then consideration of Tikosyn compliance with her Eliquis 5 twice daily Continue current dose of beta-blocker A candidate for Pradaxa in the future given cost of Eliquis  Chronic diastolic CHF (congestive heart failure) (HCC) Exacerbated by atrial fibrillation with rapid rate Will continue current dose Lasix, extra dose for any abdominal swelling or leg swelling  Leg edema We will avoid calcium channel blockers Stable symptoms on Lasix, stable renal function   Centrilobular emphysema (HCC)  Prior history of smoking Followed by pulmonary, No recent COPD exacerbations Recommended weight loss, exercise  program  Sleep apnea Poorly controlled, unable to tolerate CPAP Previously recommend she talk with ENT for other treatment options Not particularly interested  Obesity We have encouraged continued exercise, careful diet management in an effort to lose weight.  Pulmonary hypertension, unspecified ([place]) Moderate pulmonary hypertension seen on prior echocardiogram Likely exacerbated by atrial fibrillation and CHF, obesity  Continue Lasix   Back pain, knee pain Weight loss,  Recommend she restart her swimming program   Total encounter time more than 25 minutes  Greater than 50% was spent in counseling and coordination of care with the patient     Signed, Ida Rogue, MD  04/21/2021 3:15 PM    [place] [facility] [address], [place],  [address]

## 2021-04-20 ENCOUNTER — Ambulatory Visit: Payer: Medicare HMO | Admitting: Cardiovascular Disease

## 2021-04-20 ENCOUNTER — Other Ambulatory Visit: Payer: Self-pay

## 2021-04-20 ENCOUNTER — Encounter: Payer: Self-pay | Admitting: Cardiovascular Disease

## 2021-04-20 ENCOUNTER — Telehealth: Payer: Self-pay

## 2021-04-20 VITALS — BP 112/64 | HR 79 | Ht 66.5 in | Wt 220.0 lb

## 2021-04-20 DIAGNOSIS — I5032 Chronic diastolic (congestive) heart failure: Secondary | ICD-10-CM

## 2021-04-20 DIAGNOSIS — J432 Centrilobular emphysema: Secondary | ICD-10-CM | POA: Diagnosis not present

## 2021-04-20 DIAGNOSIS — I272 Pulmonary hypertension, unspecified: Secondary | ICD-10-CM | POA: Diagnosis not present

## 2021-04-20 DIAGNOSIS — G4733 Obstructive sleep apnea (adult) (pediatric): Secondary | ICD-10-CM

## 2021-04-20 DIAGNOSIS — I4819 Other persistent atrial fibrillation: Secondary | ICD-10-CM

## 2021-04-20 DIAGNOSIS — I2584 Coronary atherosclerosis due to calcified coronary lesion: Secondary | ICD-10-CM

## 2021-04-20 DIAGNOSIS — I10 Essential (primary) hypertension: Secondary | ICD-10-CM | POA: Diagnosis not present

## 2021-04-20 DIAGNOSIS — I2510 Atherosclerotic heart disease of native coronary artery without angina pectoris: Secondary | ICD-10-CM

## 2021-04-20 MED ORDER — EZETIMIBE 10 MG PO TABS: 10.0000 mg | ORAL_TABLET | Freq: Every day | ORAL | 3 refills | Status: DC

## 2021-04-20 NOTE — Telephone Encounter (Signed)
Pt dropped of PA application for Eliquis  After review, pt needs to send in her "out-of-pocket" expense report from pharmacy.  Once received, will be able to fax application to [facility]

## 2021-04-20 NOTE — Patient Instructions (Addendum)
Medication Instructions:  Please start  zetia 10 mg daily  If eliquis is declined, we should look into PRADAXA 150 twice a day  If you need a refill on your cardiac medications before your next appointment, please call your pharmacy.   Lab work: No new labs needed  Testing/Procedures: No new testing needed  Follow-Up: At [facility], you and your health needs are our priority.  As part of our continuing mission to provide you with exceptional heart care, we have created designated Provider Care Teams.  These Care Teams include your primary Cardiologist (physician) and Advanced Practice Providers (APPs -  Physician Assistants and Nurse Practitioners) who all work together to provide you with the care you need, when you need it.  You will need a follow up appointment in 6 months  Providers on your designated Care Team:   Murray Hodgkins, NP Christell Faith, PA-C Cadence Kathlen Mody, [place]  COVID-19 Vaccine Information can be found at: ShippingScam.co.uk For questions related to vaccine distribution or appointments, please email [email or web address] or call [phone].

## 2021-04-30 ENCOUNTER — Other Ambulatory Visit: Payer: Self-pay | Admitting: Cardiovascular Disease

## 2021-05-13 NOTE — Telephone Encounter (Signed)
Received "out-of-pocket" expense report  Placed with application Will give to Dr. Rockey Situ when he returns to sign, then will fax to [facility]

## 2021-05-13 NOTE — Telephone Encounter (Signed)
Patient dropped needed forms Placed in nurse box

## 2021-05-18 NOTE — Telephone Encounter (Signed)
Eliquis 5 mg BID  Received PA application Completed provider's portion, attached copy of insurance card and med list Dr. Rockey Situ has signed Forms faxed and placed in file cabinet  Received "successful" fax confirmation

## 2021-05-24 DIAGNOSIS — M2041 Other hammer toe(s) (acquired), right foot: Secondary | ICD-10-CM | POA: Diagnosis not present

## 2021-05-24 DIAGNOSIS — M19071 Primary osteoarthritis, right ankle and foot: Secondary | ICD-10-CM | POA: Diagnosis not present

## 2021-05-27 ENCOUNTER — Telehealth: Payer: Self-pay

## 2021-05-27 NOTE — Progress Notes (Signed)
Chronic Care Management Pharmacy Assistant   Name: JOLYNN BAJOREK  MRN: [id number] DOB: Sep 16, 1940  Reason for Encounter:Hypertension Disease State Call/ status update on patient application for Eliquis .   Recent office visits:  No recent office visit  Recent consult visits:  04/20/2021 Dr. Rockey Situ MD (Cardiology) Start Ezetimibe 10 mg daily, if eliquis is decline may start pradaxa 04/15/2021 Sharlet Salina DO (Physical Therapy)No Medication changes noted 04/13/2021 Sharlet Salina DO (Physical Therapy)No Medication changes noted 04/02/2021 Albertina Parr PT (Physical Therapy) No Medication changes noted 03/31/2021 Dr. Quentin Ore MD (Cardiology) Stop Amiodarone, Follow-up 3 months  Hospital visits:  None in previous 6 months  Medications: Outpatient Encounter Medications as of 05/27/2021  Medication Sig   bisoprolol (ZEBETA) 5 MG tablet Take 1.5 tablets (7.5 mg total) by mouth in the morning and at bedtime.   calcium carbonate (TUMS EX) 750 MG chewable tablet Chew 2 tablets by mouth daily as needed for heartburn.   celecoxib (CELEBREX) 200 MG capsule Take 200 mg by mouth as needed.   Cholecalciferol 25 MCG (1000 UT) tablet Take 1,000 Units by mouth daily.   cyanocobalamin 1000 MCG tablet Take 1,000 mcg by mouth daily.   ELIQUIS 5 MG TABS tablet TAKE ONE TABLET BY MOUTH TWICE DAILY   ezetimibe (ZETIA) 10 MG tablet Take 1 tablet (10 mg total) by mouth daily.   furosemide (LASIX) 40 MG tablet Take 1 tablet (40 mg total) by mouth daily. Take extra 40 mg as needed after lunch for abdominal swelling, leg swelling, or shortness of breath   loperamide (IMODIUM) 2 MG capsule Take 2 mg by mouth as needed for diarrhea or loose stools.   metaxalone (SKELAXIN) 800 MG tablet Take 800 mg by mouth daily as needed for muscle spasms.   Multiple Vitamin (MULTIVITAMIN) capsule Take 1 capsule by mouth daily.   potassium chloride (KLOR-CON) 10 MEQ tablet TAKE 1 TABLET BY MOUTH DAILY   traZODone  (DESYREL) 50 MG tablet Take 0.5-1 tablets (25-50 mg total) by mouth at bedtime as needed for sleep.   No facility-administered encounter medications on file as of 05/27/2021.    Care Gaps: COVID-19 Vaccine (4- Booster for [employer] series)  Star Rating Drugs: None ID Medication Fill Gaps: None ID   Reviewed chart prior to disease state call. Spoke with patient regarding BP  Recent Office Vitals: BP Readings from Last 3 Encounters:  04/20/21 112/64  03/31/21 140/76  03/08/21 126/80   Pulse Readings from Last 3 Encounters:  04/20/21 79  03/31/21 73  03/08/21 94    Wt Readings from Last 3 Encounters:  04/20/21 220 lb (99.8 kg)  03/31/21 217 lb (98.4 kg)  03/08/21 217 lb (98.4 kg)     Kidney Function Lab Results  Component Value Date/Time   CREATININE 0.96 02/12/2021 09:37 AM   CREATININE 0.9 12/08/2020 12:00 AM   CREATININE 0.89 09/24/2020 09:49 AM   CREATININE 0.78 01/19/2017 03:22 PM   GFRNONAA 65 12/08/2020 12:00 AM   GFRNONAA >60 09/24/2020 09:49 AM   GFRNONAA 73 01/19/2017 03:22 PM   GFRAA 75 12/09/2019 02:11 PM   GFRAA 85 01/19/2017 03:22 PM    BMP Latest Ref Rng & Units 02/12/2021 12/08/2020 09/24/2020  Glucose 70 - 99 mg/dL 107(H) - 127(H)  BUN 8 - 27 mg/dL 20 18 21   Creatinine 0.57 - 1.00 mg/dL 0.96 0.9 0.89  BUN/Creat Ratio 12 - 28 21 - -  Sodium 134 - 144 mmol/L 144 139 136  Potassium 3.5 - 5.2  mmol/L 4.4 4.1 3.9  Chloride 96 - 106 mmol/L 104 105 102  CO2 20 - 29 mmol/L 23 26(A) 21(L)  Calcium 8.7 - 10.3 mg/dL 9.7 8.8 9.0    Current antihypertensive regimen:  Lasix 40 mg Daily for pulmonary hypertension Bisoprolol 5 mg take 1.5 tablets (7.5 mg total) by mouth in the morning and at bedtime.  What recent interventions/DTPs have been made by any provider to improve Blood Pressure control since last CPP Visit: 03/31/2021 Dr. Quentin Ore MD (Cardiology) Stop Amiodarone Any recent hospitalizations or ED visits since last visit with CPP? No   Patient assistance  application update for Eliquis: I reach out to Roosvelt Harps to get a update of patient application for Eliquis.Per Roosvelt Harps, patient need to send in "out-of-pocket" expense report for year 2023 not 2022 if she does not have a expense report for this year she can send a letter stating she does not have a out-of-pocket expense report for year 2023.Patient also needs to send in proof of income before they can process her application.  I have attempted without success to contact this patient by phone three times to do her hypertension Disease State call and to inform her of Roosvelt Harps update. I left a Voice message for patient to return my call.  Adherence Review: Is the patient currently on ACE/ARB medication? No Does the patient have >5 day gap between last estimated fill dates? No    Anderson Malta Clinical Production designer, theatre/television/film [phone]

## 2021-06-15 DIAGNOSIS — M899 Disorder of bone, unspecified: Secondary | ICD-10-CM | POA: Insufficient documentation

## 2021-06-15 DIAGNOSIS — G894 Chronic pain syndrome: Secondary | ICD-10-CM | POA: Insufficient documentation

## 2021-06-15 DIAGNOSIS — Z789 Other specified health status: Secondary | ICD-10-CM | POA: Insufficient documentation

## 2021-06-15 DIAGNOSIS — Z79899 Other long term (current) drug therapy: Secondary | ICD-10-CM | POA: Insufficient documentation

## 2021-06-15 NOTE — Progress Notes (Signed)
Patient: Lauren Lloyd  Service Category: E/M  Provider: Gaspar Cola, MD  DOB: 04/16/1941  DOS: 06/16/2021  Referring Provider: [name], MD  MRN: [id number]  Setting: Ambulatory outpatient  PCP: [name], MD  Type: New Patient  Specialty: Interventional Pain Management    Location: Office  Delivery: Face-to-face     Primary Reason(s) for Visit: Encounter for initial evaluation of one or more chronic problems (new to examiner) potentially causing chronic pain, and posing a threat to normal musculoskeletal function. (Level of risk: High) CC: Back Pain (Low and center)  HPI  Ms. Koestner is a 81 y.o. year old, female patient, who comes for the first time to our practice referred by [name], MD for our initial evaluation of her chronic pain. She has Cervical pain; Acid reflux; Hypercholesteremia; IBS (irritable bowel syndrome); OSA (obstructive sleep apnea); L-S radiculopathy; Non-toxic uninodular goiter; Primary osteoarthritis of one hip; S/P TKR (total knee replacement), left; Pulmonary hypertension, unspecified ([place]); Essential hypertension; Hemorrhoids; Chronic right shoulder pain; Traumatic amputation of finger; Hematuria; Leg cramps; Metatarsalgia of both feet; Chronic diastolic CHF (congestive heart failure) ([place]); Centrilobular emphysema ([place]); Paresthesias; Persistent atrial fibrillation ([place]); Degenerative cervical spinal stenosis; Dyspnea on exertion; Obesity (BMI 30-39.9); Bradycardia; Avitaminosis D; Chronic fatigue; Insomnia; Chronic anticoagulation; Thoracic spondylosis; Primary osteoarthritis of left knee; Preoperative evaluation to rule out surgical contraindication; Carotid artery disease ([place]); Chronic pain syndrome; Pharmacologic therapy; Disorder of skeletal system; Problems influencing health status; Abnormal MRI, lumbar spine (02/24/2021); Chronic low back pain (1ry area of Pain) (Bilateral) (R>L) w/o sciatica; Chronic sacroiliac joint pain (3ry  area of Pain) (Right); Lumbar facet syndrome (Bilateral); Grade 1 Anterolisthesis of lumbar spine of L4/L5; Retrolisthesis of L1 over L2 and L2 over L3; Lumbar central spinal stenosis, w/o neurogenic claudication (L3-4, L4-5); Lumbosacral foraminal stenosis (Multilevel) (Bilateral); Lumbar lateral recess stenosis (Bilateral: L4-5); and Lumbar facet arthropathy (Multilevel) (Bilateral) on their problem list. Today she comes in for evaluation of her Back Pain (Low and center)  Pain Assessment: Location: Lower Back Radiating: denies Onset: More than a month ago Duration: Chronic pain Quality: Constant, Sharp, Shooting, Nagging, Pressure Severity: 8 /10 (subjective, self-reported pain score)  Effect on ADL:   Timing: Constant Modifying factors: rest, hot showers, heat BP: 130/77   HR: 82  Onset and Duration: Gradual and Date of onset: 5-6 years Cause of pain: Unknown Severity: NAS-11 at its worse: 8/10, NAS-11 at its best: 3/10, NAS-11 now: 33/10, and NAS-11 on the average: 3/10 Timing: During activity or exercise, After activity or exercise, and After a period of immobility Aggravating Factors: Bending, Kneeling, Prolonged sitting, and Walking uphill Alleviating Factors: Cold packs and Hot packs Associated Problems: Night-time cramps and Pain that does not allow patient to sleep Quality of Pain: Exhausting Previous Examinations or Tests: MRI scan, Nerve block, and Neurosurgical evaluation Previous Treatments: Facet blocks  According to the patient the primary area of pain is the lower back (Bilateral) (R>L).  She denies any prior surgeries, she admits to having had 3 sessions of physical therapy around December 2022, which did help to some degree but she could not afford the $400 visits.  She admits to having had a recent lumbar MRI and she also indicates having had some nerve blocks by Sharlet Salina, DO, which she indicated did not give her any relief of the pain and did not work.  Review  of the initial evaluation by Dr. Sharlet Salina on 04/01/2019 indicates that the patient's primary area of pain  was localized in the mid back at the level of the bra strap.  According to his note the pain started rather suddenly and severely around June 2022 requiring an ER evaluation.  X-rays and CT of the thoracic spine did not demonstrate any compression fractures.  He describes that the pain was felt to be mechanical and musculoskeletal.  According to him this pain has resolved.  [name] second area pain was that of the lumbosacral junction which he described worsen with standing and walking and improved with sitting and resting.  At that time the patient had no complaints of lower extremity pain, numbness, or weakness.  His lower extremity exam was completely negative.  His assessment was that of "acute on chronic the bilateral buttocks the bilateral buttocks."  He further indicates that the symptoms are consistent with neurogenic claudication versus facet mediated pain however he fails to describe any type of signs or symptoms of claudication and no patient description of such complaints.  Following the initial evaluation on 04/01/2019, the patient is next seen on 12/17/2019 for a bilateral S1 TFESI.  Again, at the time he documents that the patient is having chronic low back pain with minimal radiation into the bilateral buttocks and no lower extremity pain or radicular symptoms.  Procedures done by Sharlet Salina, DO: 12/17/2019: Bilateral S1 transforaminal ESI (good relief x3 weeks) 01/17/2020: Bilateral S1 transforaminal ESI (good relief until extensive walking in [place]) 03/16/2020: Bilateral S1 transforaminal ESI (good relief) 06/23/2020: Bilateral MBB to the L5-5 and L5-S1 facet joints (8/10 to 0/10) 07/07/2020: Bilateral MBB to the L4-5 and L5-S1 facet joints (8/10 to 0/10) 09/30/2020: RFA to the bilateral L4-5 and L5-S1 facet joints 01/21/2021: Bilateral S1 transforaminal ESI  Today I have  gone over the patient's recollection of all of the above procedures and her recollection does not match any of the information from Dr. Sharlet Salina notes.  The patient indicates that none of the procedures provided her with any short-term or long-term benefit.  She indicated that the 2 diagnostic facets only provided her with 20% relief of the pain for the duration of the local anesthetic.  She also indicates that the radiofrequency of the lumbar facets provided her with 0% improvement.  Review of the procedure notes from 06/23/2020 and 07/07/2020 indicated that he performed bilateral L3 and L4 medial branch and L5 dorsal ramus blocks, which on the 01/21/2021 note he described as medial branch blocks to the L4-5 and L5-S1 facet joints.  Physical exam: The patient was able to toe walk and heel walk without any problems at all.  Straight leg raise was negative bilaterally.  Lumbar flexion range of motion was within normal limits but the patient did experience some pain on the extension portion of the maneuver with a positive double stage recovery and pain referral towards the midline, suggestive of discogenic etiology.  Provocative Patrick maneuver was positive on the right side for sacroiliac joint arthralgia and negative bilaterally for any hip joint discomfort or significant decreased range of motion.  Hyperextension and rotation maneuver was positive bilaterally for lumbar facet arthralgia with the pain being ipsilateral to the side that the patient was turning into.  Lumbar spine lateral bending was completely negative bilaterally.  The patient denied any lower extremity pain, numbness, or weakness, with the exception of weakness on flexing her hips.  This weakness is still a 4/5, which could be associated with deconditioning since she does not exercise on a regular basis and she is 81 years old.  Lumbar spine imaging: (02/24/2021) LUMBAR MRI FINDINGS: Alignment: Slight (grade 1) degenerative anterolisthesis of L4  on L5, similar. Slight retrolisthesis of L1 on L2 and L2 on L3, similar. Similar upper lumbar levocurvature and lower lumbar dextrocurvature. Vertebrae: Chronic degenerative endplate signal changes at multiple levels.  DISC LEVELS: T12-L1: Disc height loss with mild disc bulging and moderate bilateral facet arthropathy. Is mild right greater than left foraminal stenosis. L1-2: Disc height loss with mild disc bulging and moderate bilateral facet arthropathy. Is mild right greater than left foraminal stenosis. L2-3: Broad disc bulge and mild facet hypertrophy. Similar mild left greater than right foraminal stenosis. L3-4: Broad disc bulge and moderate bilateral facet arthropathy. Mild left greater than right foraminal stenosis. Mild canal stenosis. L4-5: Grade 1 anterolisthesis, similar. Resulting uncovering the disc with slight disc bulge. Moderate bilateral facet arthropathy. Similar resulting moderate canal and bilateral subarticular recess stenosis. Similar mild to moderate bilateral foraminal stenosis. L5-S1: Disc height loss. Broad disc bulge with endplate spurring. Similar moderate to severe right and moderate left foraminal stenosis.  IMPRESSION: 1. At L5-S1, similar moderate to severe right and moderate left foraminal stenosis. 2. At L4-L5, similar moderate canal and bilateral subarticular recess stenosis with mild to moderate bilateral foraminal stenosis. 3. At L3-L4, similar mild canal and bilateral foraminal stenosis. 4. At L2-L3, similar mild bilateral foraminal stenosis.  Interpretation: Although the patient has several positive findings on the MRI, only the facet arthropathy seems to be of some significance.  Out of all of these findings that once capable of causing the clinical symptoms described by the patient seems to be those of the facet arthropathy with some possible discogenic contribution from the spondylolisthesis.  Since there are no x-rays on flexion and extension to evaluate  the stability of that spondylolisthesis, today we will be ordering some.  A more remote possibility that may explain the low back pain would be a radiculopathy affecting the L1 or L2 levels, but this is a more remote possibility since the patient denies having any groin pain.  The only thing that would go along with this is her complaints of weakness on hip flexion that is suggestive of psoas muscle weakness.  Today I took the time to provide the patient with information regarding my pain practice. The patient was informed that my practice is divided into two sections: an interventional pain management section, as well as a completely separate and distinct medication management section. I explained that I have procedure days for my interventional therapies, and evaluation days for follow-ups and medication management. Because of the amount of documentation required during both, they are kept separated. This means that there is the possibility that she may be scheduled for a procedure on one day, and medication management the next. I have also informed her that because of staffing and facility limitations, I no longer take patients for medication management only. To illustrate the reasons for this, I gave the patient the example of surgeons, and how inappropriate it would be to refer a patient to his/her care, just to write for the post-surgical antibiotics on a surgery done by a different surgeon.   Because interventional pain management is my board-certified specialty, the patient was informed that joining my practice means that they are open to any and all interventional therapies. I made it clear that this does not mean that they will be forced to have any procedures done. What this means is that I believe interventional therapies to be essential part of the diagnosis  and proper management of chronic pain conditions. Therefore, patients not interested in these interventional alternatives will be better served  under the care of a different practitioner.  The patient was also made aware of my Comprehensive Pain Management Safety Guidelines where by joining my practice, they limit all of their nerve blocks and joint injections to those done by our practice, for as long as we are retained to manage their care.   Historic Controlled Substance Pharmacotherapy Review  PMP and historical list of controlled substances: Diazepam 5 mg tablet (# 2); oxycodone IR 5 mg tablet (# 84) (last filled on 12/15/2020); tramadol 50 mg tablet (#8) Current opioid analgesics:   None MME/day: 0 mg/day  Historical Monitoring: The patient  reports no history of drug use. List of all UDS Test(s): No results found for: MDMA, COCAINSCRNUR, [place], [place], CANNABQUANT, THCU, [place] List of other Serum/Urine Drug Screening Test(s):  No results found for: AMPHSCRSER, BARBSCRSER, BENZOSCRSER, COCAINSCRSER, COCAINSCRNUR, PCPSCRSER, PCPQUANT, THCSCRSER, THCU, CANNABQUANT, OPIATESCRSER, OXYSCRSER, PROPOXSCRSER, ETH Historical Background Evaluation: [place] PMP: PDMP reviewed during this encounter. Online review of the past 11-monthperiod conducted.             PMP NARX Score Report:  Narcotic: 240 Sedative: 130 Stimulant: 000 [place] Department of public safety, offender search: (Editor, commissioningInformation) Non-contributory Risk Assessment Profile: Aberrant behavior: None observed or detected today Risk factors for fatal opioid overdose: None identified today PMP NARX Overdose Risk Score: 350 Fatal overdose hazard ratio (HR): Calculation deferred Non-fatal overdose hazard ratio (HR): Calculation deferred Risk of opioid abuse or dependence: 0.7-3.0% with doses ? 36 MME/day and 6.1-26% with doses ? 120 MME/day. Substance use disorder (SUD) risk level: See below Personal History of Substance Abuse (SUD-Substance use disorder):  Alcohol:    Illegal Drugs:    Rx Drugs:    ORT Risk Level calculation:    ORT Scoring interpretation table:  Score <3  = Low Risk for SUD  Score between 4-7 = Moderate Risk for SUD  Score >8 = High Risk for Opioid Abuse   PHQ-2 Depression Scale:  Total score: 0  PHQ-2 Scoring interpretation table: (Score and probability of major depressive disorder)  Score 0 = No depression  Score 1 = 15.4% Probability  Score 2 = 21.1% Probability  Score 3 = 38.4% Probability  Score 4 = 45.5% Probability  Score 5 = 56.4% Probability  Score 6 = 78.6% Probability   PHQ-9 Depression Scale:  Total score: 0  PHQ-9 Scoring interpretation table:  Score 0-4 = No depression  Score 5-9 = Mild depression  Score 10-14 = Moderate depression  Score 15-19 = Moderately severe depression  Score 20-27 = Severe depression (2.4 times higher risk of SUD and 2.89 times higher risk of overuse)   Pharmacologic Plan: As per protocol, I have not taken over any controlled substance management, pending the results of ordered tests and/or consults.            Initial impression: Pending review of available data and ordered tests.  Meds   Current Outpatient Medications:    bisoprolol (ZEBETA) 5 MG tablet, Take 1.5 tablets (7.5 mg total) by mouth in the morning and at bedtime., Disp: 270 tablet, Rfl: 0   calcium carbonate (TUMS EX) 750 MG chewable tablet, Chew 2 tablets by mouth daily as needed for heartburn., Disp: , Rfl:    Cholecalciferol 25 MCG (1000 UT) tablet, Take 1,000 Units by mouth daily., Disp: , Rfl:    cyanocobalamin 1000 MCG tablet, Take  1,000 mcg by mouth daily., Disp: , Rfl:    ELIQUIS 5 MG TABS tablet, TAKE ONE TABLET BY MOUTH TWICE DAILY, Disp: 180 tablet, Rfl: 1   furosemide (LASIX) 40 MG tablet, Take 1 tablet (40 mg total) by mouth daily. Take extra 40 mg as needed after lunch for abdominal swelling, leg swelling, or shortness of breath, Disp: 100 tablet, Rfl: 3   loperamide (IMODIUM) 2 MG capsule, Take 2 mg by mouth as needed for diarrhea or loose stools., Disp: , Rfl:    metaxalone (SKELAXIN) 800 MG tablet, Take 800 mg by  mouth daily as needed for muscle spasms., Disp: , Rfl:    Multiple Vitamin (MULTIVITAMIN) capsule, Take 1 capsule by mouth daily., Disp: , Rfl:    potassium chloride (KLOR-CON) 10 MEQ tablet, TAKE 1 TABLET BY MOUTH DAILY, Disp: 30 tablet, Rfl: 5  Imaging Review  Cervical Imaging: Cervical CT wo contrast: Results for orders placed during the hospital encounter of 05/30/18 CT CERVICAL SPINE WO CONTRAST  Narrative CLINICAL DATA:  LEFT arm pain and numbness, heart racing last night, radiculopathy, focal neural deficit of greater than 6 hours question stroke  EXAM: CT HEAD WITHOUT CONTRAST  CT CERVICAL SPINE WITHOUT CONTRAST  TECHNIQUE: Multidetector CT imaging of the head and cervical spine was performed following the standard protocol without intravenous contrast. Multiplanar CT image reconstructions of the cervical spine were also generated.  COMPARISON:  None  Correlation: Cervical spine radiographs 08/20/2008  FINDINGS: CT HEAD FINDINGS  Brain: Mild age-related atrophy. Normal ventricular morphology. No midline shift or mass effect. Otherwise normal appearance of brain parenchyma. No intracranial hemorrhage, mass lesion or evidence acute infarction. No extra-axial fluid collections.  Vascular: Minimal atherosclerotic calcification of internal carotid arteries at skull base.  Skull: Intact  Sinuses/Orbits: Clear  Other: N/A  CT CERVICAL SPINE FINDINGS  Alignment: Minimal anterolisthesis C4-C5 unchanged. Remaining cervical spine alignment normal.  Skull base and vertebrae: Osseous mineralization normal. Visualized skull base intact. Multilevel facet degenerative changes. Vertebral body heights maintained. No fracture, additional subluxation, or bone destruction.  Soft tissues and spinal canal: Prevertebral soft tissues normal thickness. Asymmetric enlargement of the RIGHT thyroid lobe versus LEFT question mass 3.5 cm diameter.  Disc levels:  No additional  abnormalities  Upper chest: Visualized lung apices clear.  Other: N/A  IMPRESSION: No acute intracranial abnormalities.  Degenerative facet disease changes of the cervical spine.  No acute cervical spine abnormalities.  Question RIGHT thyroid mass 3.5 cm diameter; ultrasound assessment recommended to exclude tumor.   Electronically Signed By: Lavonia Dana M.D. On: 05/30/2018 18:00  Shoulder Imaging: Shoulder-R MR w/wo contrast: Results for orders placed during the hospital encounter of 05/29/17 MR SHOULDER RIGHT W WO CONTRAST  Narrative CLINICAL DATA:  Right shoulder pain with limited range of motion. Proximal right humerus lesion on x-ray.  EXAM: MRI OF THE RIGHT SHOULDER WITHOUT AND WITH CONTRAST  TECHNIQUE: Multiplanar, multisequence MR imaging of the right shoulder was performed before and after the administration of intravenous contrast.  CONTRAST:  83m MULTIHANCE GADOBENATE DIMEGLUMINE 529 MG/ML IV SOLN  COMPARISON:  MRI dated 10/31/2007  FINDINGS: Rotator cuff: There is an articular surface partial thickness tear of the distal subscapularis tendon. The long head of the biceps tendon is dislocated into the torn subscapularis, best seen on image 13 of series 13. There are focal areas of tendinopathy of the distal infraspinatus and supraspinatus tendons as indicated by areas of enhancement after contrast administration. However, these do not appear to be tears  on T2 weighted imaging.  Muscles:  Chronic marked atrophy of the teres minor muscle.  Biceps long head: Medially dislocated into the torn subscapularis tendon.  Acromioclavicular Joint: Mild AC joint arthropathy, slightly progressed since the prior study. Type 2 acromion. No bursitis  Glenohumeral Joint: No effusion or chondral defect.  Labrum:  Intact.  Bones: There is a 15 x 14 x 11 mm well-defined sclerotic lesion in the posterolateral aspect of the proximal humeral shaft at the origin of  the lateral head of the triceps muscle. There is no enhancement after contrast administration. There is no muscle or bone edema of significance. Review of the prior MRI of 10/31/2007 demonstrates a tiny sclerotic area at that site. I think this most likely represents a benign "tug" lesion at the triceps origin, of no significance.  Minimal cystic degenerative changes of the posterior aspect of the greater tuberosity of the proximal humerus. Small marginal osteophytes on the inferior aspect of the head of the humerus.  Other: No adenopathy.  IMPRESSION: 1. Partial-thickness articular surface tear of the subscapularis tendon with dislocation of the long head of the biceps tendon into the torn subscapularis. 2. Focal tendinopathy of the distal infraspinatus and supraspinatus tendons without a tear. 3. Benign sclerotic lesion of the posterolateral aspect of the humeral shaft at the origin of the lateral head of the triceps muscle. This most likely represents a benign tug lesion.   Electronically Signed By: Lorriane Shire M.D. On: 05/29/2017 16:09  Lumbosacral Imaging: Lumbar MR wo contrast: Results for orders placed during the hospital encounter of 02/23/21 MR LUMBAR SPINE WO CONTRAST  Narrative CLINICAL DATA:  Lumbar radiculopathy M54.16 (ICD-10-CM)  EXAM: MRI LUMBAR SPINE WITHOUT CONTRAST  TECHNIQUE: Multiplanar, multisequence MR imaging of the lumbar spine was performed. No intravenous contrast was administered.  COMPARISON:  MRI 11/24/2018.  FINDINGS: Segmentation: 5 non rib-bearing lumbar vertebral bodies. Same numbering system as on the prior MRI.  Alignment: Slight (grade 1) degenerative anterolisthesis of L4 on L5, similar. Slight retrolisthesis of L1 on L2 and L2 on L3, similar. Similar upper lumbar levocurvature and lower lumbar dextrocurvature.  Vertebrae: Chronic degenerative endplate signal changes at multiple levels. Otherwise, no focal marrow edema to  suggest acute fracture or discitis/osteomyelitis.  Conus medullaris and cauda equina: Conus extends to the L1-L2 level. Conus and cauda equina appear normal.  Paraspinal and other soft tissues: Unremarkable.  Disc levels:  T12-L1: Disc height loss with mild disc bulging and moderate bilateral facet arthropathy. Is mild right greater than left foraminal stenosis, similar. No significant canal stenosis.  L1-L2: Disc height loss with mild disc bulging and moderate bilateral facet arthropathy. Is mild right greater than left foraminal stenosis, similar. No significant canal stenosis.  L2-L3: Broad disc bulge and mild facet hypertrophy. Similar mild left greater than right foraminal stenosis. No significant canal stenosis.  L3-L4: Broad disc bulge and moderate bilateral facet arthropathy. Mild left greater than right foraminal stenosis, similar. Mild canal stenosis, similar.  L4-L5: Grade 1 anterolisthesis, similar. Resulting uncovering the disc with slight disc bulge. Moderate bilateral facet arthropathy. Similar resulting moderate canal and bilateral subarticular recess stenosis. Similar mild to moderate bilateral foraminal stenosis.  L5-S1: Disc height loss. Broad disc bulge with endplate spurring. Similar moderate to severe right and moderate left foraminal stenosis. No significant canal stenosis.  IMPRESSION: 1. At L5-S1, similar moderate to severe right and moderate left foraminal stenosis. 2. At L4-L5, similar moderate canal and bilateral subarticular recess stenosis with mild to moderate bilateral foraminal  stenosis. 3. At L3-L4, similar mild canal and bilateral foraminal stenosis. 4. At L2-L3, similar mild bilateral foraminal stenosis.   Electronically Signed By: Margaretha Sheffield M.D. On: 02/24/2021 16:26  Lumbar DG (Complete) 4+V: Results for orders placed during the hospital encounter of 07/26/18 DG Lumbar Spine Complete  Narrative CLINICAL DATA:  Low back  pain.  No known injury.  EXAM: LUMBAR SPINE - COMPLETE 4+ VIEW  COMPARISON:  03/06/2018.  FINDINGS: Lumbar spine numbered with the lowest segmented appearing lumbar shaped vertebral body on lateral view as L5. Mild S-shaped thoracolumbar spine scoliosis. Diffuse osteopenia. Diffuse multilevel degenerative change. 3 mm retrolisthesis T12 on L1. 2 mm retrolisthesis L1 on L2. 3 mm anterolisthesis L4 on L5. Aortoiliac atherosclerotic vascular calcification.  IMPRESSION: 1. Mild S-shaped thoracolumbar spine scoliosis. Diffuse osteopenia. Diffuse multilevel degenerative change. 3 mm retrolisthesis T12 on L1. 2 mm retrolisthesis L1 on L2. 3 mm anterolisthesis L4 on L5.  2.  Aortoiliac atherosclerotic vascular disease.   Electronically Signed By: Marcello Moores  Register On: 07/27/2018 05:55  Knee Imaging: Knee-L DG 4 views: Results for orders placed during the hospital encounter of 06/16/16 DG Knee Complete 4 Views Left  Narrative CLINICAL DATA:  Pain.  EXAM: LEFT KNEE - COMPLETE 4+ VIEW  COMPARISON:  No recent.  FINDINGS: No acute bony or joint abnormality identified. Degenerative changes noted about the left knee. Diffuse changes most prominent about the medial joint space. Small loose body cannot be excluded.  IMPRESSION: Diffuse tricompartment degenerative change with degenerative changes most prominent about the and medial joint space. Small loose body cannot be excluded.   Electronically Signed By: Marcello Moores  Register On: 06/16/2016 12:38  Foot Imaging: Foot-L DG Complete: Results for orders placed in visit on 07/31/17 DG Foot Complete Left  Narrative Please see detailed radiograph report in office note.  Wrist Imaging: Wrist-R DG Complete: Results for orders placed during the hospital encounter of 09/24/20 DG Wrist Complete Right  Narrative CLINICAL DATA:  Right wrist pain and swelling.  No reported injury.  EXAM: RIGHT WRIST - COMPLETE 3+ VIEW  COMPARISON:   None.  FINDINGS: No fracture or dislocation. No suspicious focal osseous lesions. Severe first carpometacarpal joint osteoarthritis. Moderate interphalangeal joint right thumb osteoarthritis. Mild scattered chondrocalcinosis in the right wrist, most prominent adjacent to the radial margin of the distal scaphoid and at the triangular fibrocartilage. No radiopaque foreign bodies.  IMPRESSION: 1. Severe first carpometacarpal joint osteoarthritis. 2. Mild scattered chondrocalcinosis in the right wrist, suggesting CPPD arthropathy.   Electronically Signed By: Ilona Sorrel M.D. On: 09/24/2020 12:27  Complexity Note: Imaging results reviewed. Results shared with Ms. Bertholf, using Layman's terms.                        ROS  Cardiovascular: Abnormal heart rhythm, Blood thinners:  Antiplatelet, and Needs antibiotics prior to dental procedures Pulmonary or Respiratory: Shortness of breath, Snoring , and Temporary stoppage of breathing during sleep Neurological: No reported neurological signs or symptoms such as seizures, abnormal skin sensations, urinary and/or fecal incontinence, being born with an abnormal open spine and/or a tethered spinal cord Psychological-Psychiatric: No reported psychological or psychiatric signs or symptoms such as difficulty sleeping, anxiety, depression, delusions or hallucinations (schizophrenial), mood swings (bipolar disorders) or suicidal ideations or attempts Gastrointestinal: No reported gastrointestinal signs or symptoms such as vomiting or evacuating blood, reflux, heartburn, alternating episodes of diarrhea and constipation, inflamed or scarred liver, or pancreas or irrregular and/or infrequent bowel movements Genitourinary: No  reported renal or genitourinary signs or symptoms such as difficulty voiding or producing urine, peeing blood, non-functioning kidney, kidney stones, difficulty emptying the bladder, difficulty controlling the flow of urine, or chronic  kidney disease Hematological: No reported hematological signs or symptoms such as prolonged bleeding, low or poor functioning platelets, bruising or bleeding easily, hereditary bleeding problems, low energy levels due to low hemoglobin or being anemic Endocrine: No reported endocrine signs or symptoms such as high or low blood sugar, rapid heart rate due to high thyroid levels, obesity or weight gain due to slow thyroid or thyroid disease Rheumatologic: No reported rheumatological signs and symptoms such as fatigue, joint pain, tenderness, swelling, redness, heat, stiffness, decreased range of motion, with or without associated rash Musculoskeletal: Negative for myasthenia gravis, muscular dystrophy, multiple sclerosis or malignant hyperthermia Work History: Working part time  Allergies  Ms. Kowal is allergic to levofloxacin, influenza vaccines, and oysters [shellfish allergy].  Laboratory Chemistry Profile   Renal Lab Results  Component Value Date   BUN 20 02/12/2021   CREATININE 0.96 02/12/2021   BCR 21 02/12/2021   GFRAA 75 12/09/2019   GFRNONAA 65 12/08/2020   SPECGRAV 1.015 03/06/2018   PHUR 6.0 03/06/2018   PROTEINUR NEGATIVE 11/05/2018     Electrolytes Lab Results  Component Value Date   NA 144 02/12/2021   K 4.4 02/12/2021   CL 104 02/12/2021   CALCIUM 9.7 02/12/2021   MG 2.6 (H) 05/04/2018     Hepatic Lab Results  Component Value Date   AST 18 09/24/2020   ALT 14 09/24/2020   ALBUMIN 3.6 09/24/2020   ALKPHOS 73 09/24/2020     ID Lab Results  Component Value Date   SARSCOV2NAA NEGATIVE 06/07/2019     Bone Lab Results  Component Value Date   VD25OH 27.3 (L) 08/03/2020     Endocrine Lab Results  Component Value Date   GLUCOSE 107 (H) 02/12/2021   GLUCOSEU NEGATIVE 11/05/2018   HGBA1C 5.5 12/09/2019   TSH 1.020 08/03/2020   FREET4 1.74 12/09/2019     Neuropathy Lab Results  Component Value Date   VITAMINB12 382 07/15/2019   HGBA1C 5.5 12/09/2019      CNS No results found for: COLORCSF, APPEARCSF, RBCCOUNTCSF, WBCCSF, POLYSCSF, LYMPHSCSF, EOSCSF, PROTEINCSF, GLUCCSF, JCVIRUS, CSFOLI, IGGCSF, LABACHR, ACETBL, LABACHR, ACETBL   Inflammation (CRP: Acute   ESR: Chronic) No results found for: CRP, ESRSEDRATE, LATICACIDVEN   Rheumatology Lab Results  Component Value Date   LABURIC 7.2 (H) 09/24/2020     Coagulation Lab Results  Component Value Date   INR 1.1 09/24/2020   LABPROT 14.1 09/24/2020   APTT 32 09/24/2020   PLT 286 02/12/2021     Cardiovascular Lab Results  Component Value Date   BNP 376.0 (H) 05/03/2018   TROPONINI <0.03 11/05/2018   HGB 14.1 02/12/2021   HCT 42.4 02/12/2021     Screening Lab Results  Component Value Date   SARSCOV2NAA NEGATIVE 06/07/2019   COVIDSOURCE NASOPHARYNGEAL 04/30/2019     Cancer No results found for: CEA, CA125, LABCA2   Allergens No results found for: ALMOND, APPLE, ASPARAGUS, AVOCADO, BANANA, BARLEY, BASIL, BAYLEAF, GREENBEAN, LIMABEAN, WHITEBEAN, BEEFIGE, REDBEET, BLUEBERRY, BROCCOLI, CABBAGE, MELON, CARROT, CASEIN, CASHEWNUT, CAULIFLOWER, CELERY     Note: Lab results reviewed.  PFSH  Drug: Ms. Mangels  reports no history of drug use. Alcohol:  reports current alcohol use of about 7.0 standard drinks per week. Tobacco:  reports that she quit smoking about 32 years ago. Her smoking  use included cigarettes. She has a 30.00 pack-year smoking history. She has never used smokeless tobacco. Medical:  has a past medical history of (HFpEF) heart failure with preserved ejection fraction ([place]), Arthritis, Arthritis of knee, Back pain, Carotid arterial disease ([place]), Cholelithiasis, Edema, lower extremity, Fatty liver, GERD (gastroesophageal reflux disease), History of stress test, Knee pain, Lactose intolerance, Mitral regurgitation, Multinodular goiter, Obesity, OSA (obstructive sleep apnea), PAF (paroxysmal atrial fibrillation) ([place]), PAH (pulmonary artery hypertension) ([place]), Scoliosis,  SOB (shortness of breath), and Swallowing difficulty. Family: family history includes Atrial fibrillation in her sister and sister; Breast cancer (age of onset: 57) in her sister; Healthy in her brother; Heart attack in her father; Heart disease in her mother; High blood pressure in her father; Hyperlipidemia in her sister and sister; Stroke in her father; Transient ischemic attack in her mother.  Past Surgical History:  Procedure Laterality Date   CARDIOVERSION N/A 06/15/2018   Procedure: CARDIOVERSION (CATH LAB);  Surgeon: Minna Merritts, MD;  Location: [facility];  Service: Cardiovascular;  Laterality: N/A;   CARDIOVERSION N/A 02/18/2021   Procedure: CARDIOVERSION;  Surgeon: Minna Merritts, MD;  Location: [facility];  Service: Cardiovascular;  Laterality: N/A;   CATARACT EXTRACTION W/PHACO Right 01/25/2016   Procedure: CATARACT EXTRACTION PHACO AND INTRAOCULAR LENS PLACEMENT ([place]);  Surgeon: Ronnell Freshwater, MD;  Location: [place];  Service: Ophthalmology;  Laterality: Right;  RIGHT   CATARACT EXTRACTION W/PHACO Left 02/22/2016   Procedure: CATARACT EXTRACTION PHACO AND INTRAOCULAR LENS PLACEMENT ([place]);  Surgeon: Ronnell Freshwater, MD;  Location: [place];  Service: Ophthalmology;  Laterality: Left;  LEFT   HAMMER TOE SURGERY  05/16/2008   KNEE ARTHROSCOPY Right 05/16/2002   REPLACEMENT TOTAL KNEE Right 05/17/2007   [facility]   SKIN GRAFT Left 04/08/2013   Done on left index finger   TONSILLECTOMY  05/16/1944   TOTAL KNEE ARTHROPLASTY Left 12/02/2020   Active Ambulatory Problems    Diagnosis Date Noted   Cervical pain 08/20/2008   Acid reflux 11/18/2014   Hypercholesteremia 07/30/2007   IBS (irritable bowel syndrome) 11/18/2014   OSA (obstructive sleep apnea) 12/12/2007   L-S radiculopathy 09/29/2011   Non-toxic uninodular goiter 06/20/2009   Primary osteoarthritis of one hip 10/03/2011   S/P TKR (total knee replacement),  left 09/29/2011   Pulmonary hypertension, unspecified ([place]) 04/02/2015   Essential hypertension 01/03/2017   Hemorrhoids 01/03/2017   Chronic right shoulder pain 01/03/2017   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/2017   Hematuria 04/04/2017   Leg cramps 04/04/2017   Metatarsalgia of both feet 04/04/2017   Chronic diastolic CHF (congestive heart failure) ([place]) 05/11/2018   Centrilobular emphysema ([place]) 05/11/2018   Paresthesias 05/30/2018   Persistent atrial fibrillation ([place]) 06/06/2018   Degenerative cervical spinal stenosis 07/26/2018   Dyspnea on exertion 08/28/2018   Obesity (BMI 30-39.9) 11/08/2019   Bradycardia 11/08/2019   Avitaminosis D 08/03/2020   Chronic fatigue 08/03/2020   Insomnia 08/03/2020   Chronic anticoagulation 12/05/2020   Thoracic spondylosis 02/27/2019   Primary osteoarthritis of left knee 09/29/2011   Preoperative evaluation to rule out surgical contraindication 11/10/2020   Carotid artery disease ([place]) 12/05/2020   Chronic pain syndrome 06/15/2021   Pharmacologic therapy 06/15/2021   Disorder of skeletal system 06/15/2021   Problems influencing health status 06/15/2021   Abnormal MRI, lumbar spine (02/24/2021) 06/16/2021   Chronic low back pain (1ry area of Pain) (Bilateral) (R>L) w/o sciatica 06/16/2021   Chronic sacroiliac joint pain (3ry area of Pain) (  Right) 06/16/2021   Lumbar facet syndrome (Bilateral) 06/16/2021   Grade 1 Anterolisthesis of lumbar spine of L4/L5 06/16/2021   Retrolisthesis of L1 over L2 and L2 over L3 06/16/2021   Lumbar central spinal stenosis, w/o neurogenic claudication (L3-4, L4-5) 06/16/2021   Lumbosacral foraminal stenosis (Multilevel) (Bilateral) 06/16/2021   Lumbar lateral recess stenosis (Bilateral: L4-5) 06/16/2021   Lumbar facet arthropathy (Multilevel) (Bilateral) 06/16/2021   Resolved Ambulatory Problems    Diagnosis Date Noted   Chest pain radiating to arm 02/17/2015   Upper respiratory infection 06/05/2015    Healthcare maintenance 01/03/2017   A-fib ([place]) 05/03/2018   Smoker 05/11/2018   Leg edema 05/11/2018   TIA (transient ischemic attack) 05/30/2018   Degenerative arthritis of left knee 07/26/2018   Dizziness 11/08/2019   Morbid obesity ([place]) 08/03/2020   Acute postoperative pain 12/06/2020   Chronic pain of left knee 05/18/2020   Past Medical History:  Diagnosis Date   (HFpEF) heart failure with preserved ejection fraction (HCC)    Arthritis    Arthritis of knee    Back pain    Carotid arterial disease (HCC)    Cholelithiasis    Edema, lower extremity    Fatty liver    GERD (gastroesophageal reflux disease)    History of stress test    Knee pain    Lactose intolerance    Mitral regurgitation    Multinodular goiter    Obesity    PAF (paroxysmal atrial fibrillation) (HCC)    PAH (pulmonary artery hypertension) (HCC)    Scoliosis    SOB (shortness of breath)    Swallowing difficulty    Constitutional Exam  General appearance: Well nourished, well developed, and well hydrated. In no apparent acute distress Vitals:   06/16/21 1106  BP: 130/77  Pulse: 82  Resp: 16  Temp: (!) 97.2 F (36.2 C)  TempSrc: Temporal  SpO2: 98%  Weight: 214 lb (97.1 kg)  Height: _0  (1.702 m)   BMI Assessment: Estimated body mass index is 33.52 kg/m as calculated from the following:   Height as of this encounter: _1  (1.702 m).   Weight as of this encounter: 214 lb (97.1 kg).  BMI interpretation table: BMI level Category Range association with higher incidence of chronic pain  <18 kg/m2 Underweight   18.5-24.9 kg/m2 Ideal body weight   25-29.9 kg/m2 Overweight Increased incidence by 20%  30-34.9 kg/m2 Obese (Class I) Increased incidence by 68%  35-39.9 kg/m2 Severe obesity (Class II) Increased incidence by 136%  >40 kg/m2 Extreme obesity (Class III) Increased incidence by 254%   Patient's current BMI Ideal Body weight  Body mass index is 33.52 kg/m. Ideal body weight: 61.6 kg  (135 lb 12.9 oz) Adjusted ideal body weight: 75.8 kg (167 lb 1.3 oz)   BMI Readings from Last 4 Encounters:  06/16/21 33.52 kg/m  04/20/21 34.98 kg/m  03/31/21 35.02 kg/m  03/08/21 35.02 kg/m   Wt Readings from Last 4 Encounters:  06/16/21 214 lb (97.1 kg)  04/20/21 220 lb (99.8 kg)  03/31/21 217 lb (98.4 kg)  03/08/21 217 lb (98.4 kg)    Psych/Mental status: Alert, oriented x 3 (person, place, & time)       Eyes: PERLA Respiratory: No evidence of acute respiratory distress  Assessment  Primary Diagnosis & Pertinent Problem List: The primary encounter diagnosis was Chronic pain syndrome. Diagnoses of Chronic low back pain (1ry area of Pain) (Bilateral) (R>L) w/o sciatica, Lumbar facet syndrome (Bilateral), Lumbar facet arthropathy (Multilevel) (  Bilateral), Chronic sacroiliac joint pain (3ry area of Pain) (Right), Retrolisthesis of L1 over L2 and L2 over L3, Grade 1 Anterolisthesis of lumbar spine of L4/L5, Lumbar central spinal stenosis, w/o neurogenic claudication (L3-4, L4-5), Lumbosacral foraminal stenosis (Multilevel) (Bilateral), Lumbar lateral recess stenosis (Bilateral: L4-5), Abnormal MRI, lumbar spine (02/24/2021), Pharmacologic therapy, Disorder of skeletal system, and Problems influencing health status were also pertinent to this visit.  Visit Diagnosis (New problems to examiner): 1. Chronic pain syndrome   2. Chronic low back pain (1ry area of Pain) (Bilateral) (R>L) w/o sciatica   3. Lumbar facet syndrome (Bilateral)   4. Lumbar facet arthropathy (Multilevel) (Bilateral)   5. Chronic sacroiliac joint pain (3ry area of Pain) (Right)   6. Retrolisthesis of L1 over L2 and L2 over L3   7. Grade 1 Anterolisthesis of lumbar spine of L4/L5   8. Lumbar central spinal stenosis, w/o neurogenic claudication (L3-4, L4-5)   9. Lumbosacral foraminal stenosis (Multilevel) (Bilateral)   10. Lumbar lateral recess stenosis (Bilateral: L4-5)   11. Abnormal MRI, lumbar spine  (02/24/2021)   12. Pharmacologic therapy   13. Disorder of skeletal system   14. Problems influencing health status    Plan of Care (Initial workup plan)  Note: Ms. Lobosco was reminded that as per protocol, today's visit has been an evaluation only. We have not taken over the patient's controlled substance management.  Problem-specific plan: No problem-specific Assessment & Plan notes found for this encounter.  Lab Orders         Compliance Drug Analysis, Ur         Magnesium         Vitamin B12         Sedimentation rate         25-Hydroxy vitamin D Lcms D2+D3         C-reactive protein     Imaging Orders         DG Lumbar Spine Complete W/Bend         DG Si Joints     Referral Orders  No referral(s) requested today   Procedure Orders    No procedure(s) ordered today   Pharmacotherapy (current): Medications ordered:  No orders of the defined types were placed in this encounter.  Medications administered during this visit: Ranie C. Desmarais had no medications administered during this visit.   Pharmacological management options:  Opioid Analgesics: The patient was informed that there is no guarantee that she would be a candidate for opioid analgesics. The decision will be made following CDC guidelines. This decision will be based on the results of diagnostic studies, as well as Ms. Mcmackin's risk profile.   Membrane stabilizer: To be determined at a later time  Muscle relaxant: To be determined at a later time  NSAID: To be determined at a later time  Other analgesic(s): To be determined at a later time   Interventional management options: Ms. Hack was informed that there is no guarantee that she would be a candidate for interventional therapies. The decision will be based on the results of diagnostic studies, as well as Ms. Pitz's risk profile.  Procedure(s) under consideration:  Pending results of ordered studies     Interventional Therapies  Risk   Complexity  Considerations:   Estimated body mass index is 33.52 kg/m as calculated from the following:   Height as of this encounter: _0  (1.702 m).   Weight as of this encounter: 214 lb (97.1 kg). Eliquis ANTICOAGULATION: (Stop: 3  days   Restart: 6 hours)   Planned   Pending:   Pending further evaluation   Under consideration:   Diagnostic bilateral lumbar facet MBB (T12-L5) #1    Completed:   None at this time   Completed by Sharlet Salina, DO:   01/21/2021: Bilateral S1 transforaminal ESI (no benefit)  09/30/2020: RFA to the bilateral L4-5 and L5-S1 facet joints (no benefit)  07/07/2020: Bilateral MBB to the L4-5 and L5-S1 facet joints (8/10 to 0/10) (20% benefit according to patient)  06/23/2020: Bilateral MBB to the L5-5 and L5-S1 facet joints (8/10 to 0/10) (20% benefit according to patient)  03/16/2020: Bilateral S1 transforaminal ESI (good relief)  01/17/2020: Bilateral S1 transforaminal ESI (good relief until extensive walking in [place])  12/17/2019: Bilateral S1 transforaminal ESI (good relief x3 weeks)    Therapeutic   Palliative (PRN) options:   None established    Provider-requested follow-up: Return for (75mn), Eval-day (M,W), (F2F), 2nd Visit, for review of ordered tests.  Future Appointments  Date Time Provider [place] 07/07/2021  9:00 AM LVickie Epley MD [facility]  07/19/2021  1:00 PM NMilinda Pointer MD [facility] None  08/16/2021  8:00 AM [facility] Crews [facility] 10/26/2021 10:00 AM GRockey Situ TKathlene November MD [facility]    Note by: FGaspar Cola MD Date: 06/16/2021; Time: 6:54 PM

## 2021-06-16 ENCOUNTER — Ambulatory Visit
Admission: RE | Admit: 2021-06-16 | Discharge: 2021-06-16 | Disposition: A | Discharge status: Home / Self Care | Payer: Medicare HMO | Source: Ambulatory Visit | Attending: Pain Medicine | Admitting: Pain Medicine

## 2021-06-16 ENCOUNTER — Encounter: Payer: Self-pay | Admitting: Pain Medicine

## 2021-06-16 ENCOUNTER — Ambulatory Visit: Payer: Medicare HMO | Admitting: Pain Medicine

## 2021-06-16 ENCOUNTER — Other Ambulatory Visit: Payer: Self-pay

## 2021-06-16 VITALS — BP 130/77 | HR 82 | Temp 97.2°F | Resp 16 | Ht 67.0 in | Wt 214.0 lb

## 2021-06-16 DIAGNOSIS — Z789 Other specified health status: Secondary | ICD-10-CM

## 2021-06-16 DIAGNOSIS — M533 Sacrococcygeal disorders, not elsewhere classified: Secondary | ICD-10-CM | POA: Insufficient documentation

## 2021-06-16 DIAGNOSIS — M4316 Spondylolisthesis, lumbar region: Secondary | ICD-10-CM

## 2021-06-16 DIAGNOSIS — M48061 Spinal stenosis, lumbar region without neurogenic claudication: Secondary | ICD-10-CM | POA: Diagnosis not present

## 2021-06-16 DIAGNOSIS — R937 Abnormal findings on diagnostic imaging of other parts of musculoskeletal system: Secondary | ICD-10-CM | POA: Diagnosis not present

## 2021-06-16 DIAGNOSIS — G8929 Other chronic pain: Secondary | ICD-10-CM | POA: Insufficient documentation

## 2021-06-16 DIAGNOSIS — M899 Disorder of bone, unspecified: Secondary | ICD-10-CM | POA: Diagnosis not present

## 2021-06-16 DIAGNOSIS — M47816 Spondylosis without myelopathy or radiculopathy, lumbar region: Secondary | ICD-10-CM

## 2021-06-16 DIAGNOSIS — Z79899 Other long term (current) drug therapy: Secondary | ICD-10-CM

## 2021-06-16 DIAGNOSIS — M545 Low back pain, unspecified: Secondary | ICD-10-CM | POA: Insufficient documentation

## 2021-06-16 DIAGNOSIS — M4310 Spondylolisthesis, site unspecified: Secondary | ICD-10-CM

## 2021-06-16 DIAGNOSIS — G894 Chronic pain syndrome: Secondary | ICD-10-CM

## 2021-06-16 DIAGNOSIS — M4807 Spinal stenosis, lumbosacral region: Secondary | ICD-10-CM

## 2021-06-16 DIAGNOSIS — M47817 Spondylosis without myelopathy or radiculopathy, lumbosacral region: Secondary | ICD-10-CM | POA: Diagnosis not present

## 2021-06-16 NOTE — Progress Notes (Addendum)
Safety precautions to be maintained throughout the outpatient stay will include: orient to surroundings, keep bed in low position, maintain call bell within reach at all times, provide assistance with transfer out of bed and ambulation.  

## 2021-06-21 DIAGNOSIS — M2041 Other hammer toe(s) (acquired), right foot: Secondary | ICD-10-CM | POA: Diagnosis not present

## 2021-06-21 DIAGNOSIS — M7741 Metatarsalgia, right foot: Secondary | ICD-10-CM | POA: Diagnosis not present

## 2021-06-21 DIAGNOSIS — I4891 Unspecified atrial fibrillation: Secondary | ICD-10-CM | POA: Diagnosis not present

## 2021-06-21 LAB — COMPLIANCE DRUG ANALYSIS, UR

## 2021-06-24 LAB — SEDIMENTATION RATE: Sed Rate: 47 mm/hr — ABNORMAL HIGH (ref 0–40)

## 2021-06-24 LAB — C-REACTIVE PROTEIN: CRP: 5 mg/L (ref 0–10)

## 2021-06-24 LAB — VITAMIN B12: Vitamin B-12: 1246 pg/mL — ABNORMAL HIGH (ref 232–1245)

## 2021-06-24 LAB — 25-HYDROXY VITAMIN D LCMS D2+D3
25-Hydroxy, Vitamin D-2: 4.6 ng/mL
25-Hydroxy, Vitamin D-3: 49 ng/mL
25-Hydroxy, Vitamin D: 54 ng/mL

## 2021-06-24 LAB — MAGNESIUM: Magnesium: 2.1 mg/dL (ref 1.6–2.3)

## 2021-06-29 ENCOUNTER — Telehealth: Payer: Self-pay

## 2021-06-29 NOTE — Progress Notes (Signed)
Chronic Care Management Pharmacy Assistant   Name: Lauren Lloyd  MRN: [id number] DOB: 1940-05-20  Reason for Encounter:Hypertension Disease State Call.   Recent office visits:  No recent office visit  Recent consult visits:  06/16/2021 Dr. Dossie Arbour MD (Pain medicine) No medication Changes noted  Hospital visits:  None in previous 6 months  Medications: Outpatient Encounter Medications as of 06/29/2021  Medication Sig   bisoprolol (ZEBETA) 5 MG tablet Take 1.5 tablets (7.5 mg total) by mouth in the morning and at bedtime.   calcium carbonate (TUMS EX) 750 MG chewable tablet Chew 2 tablets by mouth daily as needed for heartburn.   Cholecalciferol 25 MCG (1000 UT) tablet Take 1,000 Units by mouth daily.   cyanocobalamin 1000 MCG tablet Take 1,000 mcg by mouth daily.   ELIQUIS 5 MG TABS tablet TAKE ONE TABLET BY MOUTH TWICE DAILY   furosemide (LASIX) 40 MG tablet Take 1 tablet (40 mg total) by mouth daily. Take extra 40 mg as needed after lunch for abdominal swelling, leg swelling, or shortness of breath   loperamide (IMODIUM) 2 MG capsule Take 2 mg by mouth as needed for diarrhea or loose stools.   metaxalone (SKELAXIN) 800 MG tablet Take 800 mg by mouth daily as needed for muscle spasms.   Multiple Vitamin (MULTIVITAMIN) capsule Take 1 capsule by mouth daily.   potassium chloride (KLOR-CON) 10 MEQ tablet TAKE 1 TABLET BY MOUTH DAILY   No facility-administered encounter medications on file as of 06/29/2021.    Care Gaps: COVID-19 Vaccine (4- Booster for [employer] series)  Star Rating Drugs: None ID Medication Fill Gaps: None ID   Reviewed chart prior to disease state call. Spoke with patient regarding BP  Recent Office Vitals: BP Readings from Last 3 Encounters:  06/16/21 130/77  04/20/21 112/64  03/31/21 140/76   Pulse Readings from Last 3 Encounters:  06/16/21 82  04/20/21 79  03/31/21 73    Wt Readings from Last 3 Encounters:  06/16/21 214 lb (97.1 kg)  04/20/21  220 lb (99.8 kg)  03/31/21 217 lb (98.4 kg)     Kidney Function Lab Results  Component Value Date/Time   CREATININE 0.96 02/12/2021 09:37 AM   CREATININE 0.9 12/08/2020 12:00 AM   CREATININE 0.89 09/24/2020 09:49 AM   CREATININE 0.78 01/19/2017 03:22 PM   GFRNONAA 65 12/08/2020 12:00 AM   GFRNONAA >60 09/24/2020 09:49 AM   GFRNONAA 73 01/19/2017 03:22 PM   GFRAA 75 12/09/2019 02:11 PM   GFRAA 85 01/19/2017 03:22 PM    BMP Latest Ref Rng & Units 02/12/2021 12/08/2020 09/24/2020  Glucose 70 - 99 mg/dL 107(H) - 127(H)  BUN 8 - 27 mg/dL 20 18 21   Creatinine 0.57 - 1.00 mg/dL 0.96 0.9 0.89  BUN/Creat Ratio 12 - 28 21 - -  Sodium 134 - 144 mmol/L 144 139 136  Potassium 3.5 - 5.2 mmol/L 4.4 4.1 3.9  Chloride 96 - 106 mmol/L 104 105 102  CO2 20 - 29 mmol/L 23 26(A) 21(L)  Calcium 8.7 - 10.3 mg/dL 9.7 8.8 9.0    Current antihypertensive regimen:  Lasix 40 mg Daily for pulmonary hypertension Bisoprolol 5 mg take 1.5 tablets (7.5 mg total) by mouth in the morning and at bedtime.  Patient denies headaches,dizziness or lightheadedness.  How often are you checking your Blood Pressure? infrequently Current home BP readings:  130/78,128/78 What recent interventions/DTPs have been made by any provider to improve Blood Pressure control since last CPP Visit: None ID  Any recent hospitalizations or ED visits since last visit with CPP? No What diet changes have been made to improve Blood Pressure Control?  Patient denies changes to her diet. What exercise is being done to improve your Blood Pressure Control?  None ID  Adherence Review: Is the patient currently on ACE/ARB medication? No Does the patient have >5 day gap between last estimated fill dates? No  Patient agreed to schedule a telephone follow up with the clinical pharmacist on 07/27/2021 at 3:45 pm.  [place] Pharmacist Assistant [phone]

## 2021-07-06 NOTE — Progress Notes (Signed)
Electrophysiology Office Follow up Visit Note:    Date:  07/07/2021   ID:  Lauren Lloyd, DOB [birth year], MRN [id number]  PCP:  [name], MD  [facility] Cardiologist:  Ida Rogue, MD  [facility] Electrophysiologist:  Vickie Epley, MD    Interval History:    Lauren Lloyd is a 81 y.o. female who presents for a follow up visit. They were last seen in clinic 03/31/2021 for AF. At that appointment amiodarone was discontinued and we planned to meet back in 3 months to discuss dofetilide.  She has been doing well since I last saw her.  She currently takes Imodium every 4 days or so for diarrhea but is going to speak with her primary care physician about transitioning off this medication in hopes of starting dofetilide.  She has an upcoming trip to [place] in April and then a trip to the [place].  She will be back the first week of May.  She is interested in starting her Tikosyn in late May.  She takes her Eliquis twice daily for stroke prophylaxis.       Past Medical History:  Diagnosis Date   (HFpEF) heart failure with preserved ejection fraction ([place])    a. 05/2018 Echo: EF 55-60%, no rwma, mild to mod MR. Nl RV fxn. Mod TR. PASP 66mmHg.   Arthritis    knees, Hands   Arthritis of knee    Back pain    Carotid arterial disease ([place])    a. 03/2019 Carotid U/S: <50% bilat ICA stenoses.   Cholelithiasis    a. 10/2018 noted on CT.   Edema, lower extremity    Fatty liver    GERD (gastroesophageal reflux disease)    History of stress test    a. 06/2018 MV: EF 59%, no ischemia/infarct. Low risk.   Knee pain    Lactose intolerance    Mitral regurgitation    a. 05/2018 Echo: mild to mod MR.   Multinodular goiter    Obesity    OSA (obstructive sleep apnea)    PAF (paroxysmal atrial fibrillation) ([place])    a.  Diagnosed 12/19; b. 05/2018 s/p DCCV; c. 03/2019 & 05/2019 recurrent AFib-->managed w/ amio load; d. CHADS2VASc = 6 (CHF, HTN, age x 2, vascular disease,  female)-->Eliquis & amio 100 qd.   PAH (pulmonary artery hypertension) (HCC)    Scoliosis    SOB (shortness of breath)    Swallowing difficulty     Past Surgical History:  Procedure Laterality Date   CARDIOVERSION N/A 06/15/2018   Procedure: CARDIOVERSION (CATH LAB);  Surgeon: Minna Merritts, MD;  Location: [facility]S;  Service: Cardiovascular;  Laterality: N/A;   CARDIOVERSION N/A 02/18/2021   Procedure: CARDIOVERSION;  Surgeon: Minna Merritts, MD;  Location: [facility] OR[facility];  Service: Cardiovascular;  Laterality: N/A;   CATARACT EXTRACTION W/PHACO Right 01/25/2016   Procedure: CATARACT EXTRACTION PHACO AND INTRAOCULAR LENS PLACEMENT ([place]);  Surgeon: Ronnell Freshwater, MD;  Location: [place];  Service: Ophthalmology;  Laterality: Right;  RIGHT   CATARACT EXTRACTION W/PHACO Left 02/22/2016   Procedure: CATARACT EXTRACTION PHACO AND INTRAOCULAR LENS PLACEMENT ([place]);  Surgeon: Ronnell Freshwater, MD;  Location: [place];  Service: Ophthalmology;  Laterality: Left;  LEFT   HAMMER TOE SURGERY  05/16/2008   KNEE ARTHROSCOPY Right 05/16/2002   REPLACEMENT TOTAL KNEE Right 05/17/2007   [facility]   SKIN GRAFT Left 04/08/2013   Done on left index finger   TONSILLECTOMY  05/16/1944   TOTAL KNEE ARTHROPLASTY Left 12/02/2020    Current Medications: Current Meds  Medication Sig   bisoprolol (ZEBETA) 5 MG tablet Take 1.5 tablets (7.5 mg total) by mouth in the morning and at bedtime.   calcium carbonate (TUMS EX) 750 MG chewable tablet Chew 2 tablets by mouth daily as needed for heartburn.   Cholecalciferol 25 MCG (1000 UT) tablet Take 1,000 Units by mouth daily.   ELIQUIS 5 MG TABS tablet TAKE ONE TABLET BY MOUTH TWICE DAILY   furosemide (LASIX) 40 MG tablet Take 1 tablet (40 mg total) by mouth daily. Take extra 40 mg as needed after lunch for abdominal swelling, leg swelling, or shortness of breath   loperamide (IMODIUM) 2 MG capsule Take 2  mg by mouth as needed for diarrhea or loose stools.   metaxalone (SKELAXIN) 800 MG tablet Take 800 mg by mouth daily as needed for muscle spasms.   Multiple Vitamin (MULTIVITAMIN) capsule Take 1 capsule by mouth daily.   potassium chloride (KLOR-CON) 10 MEQ tablet TAKE 1 TABLET BY MOUTH DAILY     Allergies:   Levofloxacin, Influenza vaccines, and Oysters [shellfish allergy]   Social History   Socioeconomic History   Marital status: Single    Spouse name: Not on file   Number of children: 5   Years of education: college   Highest education level: Bachelor's degree (e.g., BA, AB, BS)  Occupational History   Occupation: Retail banker: [employer]  Tobacco Use   Smoking status: Former    Packs/day: 1.00    Years: 30.00    Pack years: 30.00    Types: Cigarettes    Quit date: 05/16/1989    Years since quitting: 32.1   Smokeless tobacco: Never  Vaping Use   Vaping Use: Never used  Substance and Sexual Activity   Alcohol use: Yes    Alcohol/week: 7.0 standard drinks    Types: 7 Glasses of wine per week    Comment: 0-2 a night   Drug use: No   Sexual activity: Not Currently  Other Topics Concern   Not on file  Social History Narrative   Pt has a child who passed away at age 29   Social Determinants of Health   Financial Resource Strain: Not on file  Food Insecurity: Not on file  Transportation Needs: Not on file  Physical Activity: Not on file  Stress: Not on file  Social Connections: Not on file     Family History: [name] family history includes Atrial fibrillation in her sister and sister; Breast cancer (age of onset: 110) in her sister; Healthy in her brother; Heart attack in her father; Heart disease in her mother; High blood pressure in her father; Hyperlipidemia in her sister and sister; Stroke in her father; Transient ischemic attack in her mother.  ROS:   Please see the history of present illness.    All other systems reviewed and  are negative.  EKGs/Labs/Other Studies Reviewed:    The following studies were reviewed today:   EKG:  The ekg ordered today demonstrates atrial fibrillation with a ventricular rate of 89 bpm.  QTc is approximately 470 ms in A-fib.  Recent Labs: 08/03/2020: TSH 1.020 09/24/2020: ALT 14 02/12/2021: BUN 20; Creatinine, Ser 0.96; Hemoglobin 14.1; Platelets 286; Potassium 4.4; Sodium 144 06/16/2021: Magnesium 2.1  Recent Lipid Panel    Component Value Date/Time   CHOL 289 (H) 08/03/2020 1012   TRIG 193 (H) 08/03/2020  1012   HDL 65 08/03/2020 1012   CHOLHDL 4.4 08/03/2020 1012   CHOLHDL 3.6 05/31/2018 0452   VLDL 21 05/31/2018 0452   LDLCALC 188 (H) 08/03/2020 1012    Physical Exam:    VS:  BP 120/76 (BP Location: Left Arm, Patient Position: Sitting, Cuff Size: Normal)    Pulse 89    Ht 5\' 7"  (1.702 m)    Wt 216 lb (98 kg)    SpO2 97%    BMI 33.83 kg/m     Wt Readings from Last 3 Encounters:  07/07/21 216 lb (98 kg)  06/16/21 214 lb (97.1 kg)  04/20/21 220 lb (99.8 kg)     GEN:  Well nourished, well developed in no acute distress HEENT: Normal NECK: No JVD; No carotid bruits LYMPHATICS: No lymphadenopathy CARDIAC: RRR, no murmurs, rubs, gallops RESPIRATORY:  Clear to auscultation without rales, wheezing or rhonchi  ABDOMEN: Soft, non-tender, non-distended MUSCULOSKELETAL:  No edema; No deformity  SKIN: Warm and dry NEUROLOGIC:  Alert and oriented x 3 PSYCHIATRIC:  Normal affect        ASSESSMENT:    1. Persistent atrial fibrillation with RVR ([place])   2. Chronic diastolic CHF (congestive heart failure) ([place])   3. OSA (obstructive sleep apnea)   4. Centrilobular emphysema (HCC)    PLAN:    In order of problems listed above:  #Persistent atrial fibrillation Off amiodarone for the last 3 months.  Have discussed rhythm control options with the patient during previous appointments and she is interested in proceeding with dofetilide.  We will target late May to bring  her into the hospital for dofetilide initiation.  In the meantime she will talk to her primary care physician for an alternative to Imodium for her diarrhea.  She is not a candidate for Imodium when she starts dofetilide.  Continue Eliquis for stroke prophylaxis.   #Chronic diastolic heart failure Euvolemic today.  NYHA class II-III.  Has concomitant pulmonary disease which complicates her symptoms.  #OSA    Medication Adjustments/Labs and Tests Ordered: Current medicines are reviewed at length with the patient today.  Concerns regarding medicines are outlined above.  No orders of the defined types were placed in this encounter.  No orders of the defined types were placed in this encounter.    Signed, Lars Mage, MD, [facility], [facility] 07/07/2021 9:28 AM    Electrophysiology [facility]

## 2021-07-07 ENCOUNTER — Other Ambulatory Visit: Payer: Self-pay

## 2021-07-07 ENCOUNTER — Telehealth: Payer: Self-pay | Admitting: Pharmacist

## 2021-07-07 ENCOUNTER — Encounter: Payer: Self-pay | Admitting: Cardiology

## 2021-07-07 ENCOUNTER — Ambulatory Visit: Payer: Medicare HMO | Admitting: Cardiology

## 2021-07-07 ENCOUNTER — Telehealth: Payer: Self-pay | Admitting: Cardiology

## 2021-07-07 VITALS — BP 120/76 | HR 89 | Ht 67.0 in | Wt 216.0 lb

## 2021-07-07 DIAGNOSIS — G4733 Obstructive sleep apnea (adult) (pediatric): Secondary | ICD-10-CM | POA: Diagnosis not present

## 2021-07-07 DIAGNOSIS — I5032 Chronic diastolic (congestive) heart failure: Secondary | ICD-10-CM

## 2021-07-07 DIAGNOSIS — J432 Centrilobular emphysema: Secondary | ICD-10-CM

## 2021-07-07 DIAGNOSIS — I4819 Other persistent atrial fibrillation: Secondary | ICD-10-CM | POA: Diagnosis not present

## 2021-07-07 NOTE — Telephone Encounter (Signed)
Pt c/o medication issue:  1. Name of Medication: tykosin  2. How are you currently taking this medication (dosage and times per day)? Not taking yet  3. Are you having a reaction (difficulty breathing--STAT)? no  4. What is your medication issue? Patient insurance does not cover at all and patient is guessing she will not be able to afford.   Please call to discuss .

## 2021-07-07 NOTE — Patient Instructions (Signed)
Medications: Your physician recommends that you continue on your current medications as directed. Please refer to the Current Medication list given to you today. *If you need a refill on your cardiac medications before your next appointment, please call your pharmacy*  Lab Work: None. If you have labs (blood work) drawn today and your tests are completely normal, you will receive your results only by: [place] (if you have MyChart) OR A paper copy in the mail If you have any lab test that is abnormal or we need to change your treatment, we will call you to review the results.  Testing/Procedures: None.  Follow-Up: At [facility], you and your health needs are our priority.  As part of our continuing mission to provide you with exceptional heart care, we have created designated Provider Care Teams.  These Care Teams include your primary Cardiologist (physician) and Advanced Practice Providers (APPs -  Physician Assistants and Nurse Practitioners) who all work together to provide you with the care you need, when you need it.  Your physician wants you to follow-up in: the Afib Clinic [facility] RN) with contact you to set up a dofetilide admit to hospital in May.   AFIB CLINIC INFORMATION: Your appointment is scheduled on: _____ at _____. Please arrive 15 minutes early for check-in. The AFib Clinic is located in the Heart and Vascular Specialty Clinics at [facility]. Parking instructions/directions: [address] (off [address]). When you pull in to Entrance C, there is an underground parking garage to your right. The code to enter the garage is _____. Take the elevators to the first floor. Follow the signs to the Heart and Vascular Specialty Clinics. You will see registration at the end of the hallway.  Phone number: [phone]  We recommend signing up for the patient portal called "MyChart".  Sign up information is provided on this After Visit Summary.  MyChart is used to  connect with patients for Virtual Visits (Telemedicine).  Patients are able to view lab/test results, encounter notes, upcoming appointments, etc.  Non-urgent messages can be sent to your provider as well.   To learn more about what you can do with MyChart, go to [email or web address].    Any Other Special Instructions Will Be Listed Below (If Applicable).   Tikosyn (Dofetilide) Hospital Admission  Prior to day of admission: Check with drug insurance company for cost of drug to ensure affordability --- Dofetilide 500 mcg twice a day.  GoodRx is an option if insurance copay is unaffordable.  All patients are tested for COVID-19 prior to admission.  No Benadryl is allowed 3 days prior to admission.  Please ensure no missed doses of your anticoagulation (blood thinner) for 3 weeks prior to admission. If a dose is missed please notify our office immediately.  A pharmacist will review all your medications for potential interactions with Tikosyn. If any medication changes are needed prior to admission we will be in touch with you.  If any new medications are started AFTER your admission date is set with Nurse, adult. Please notify our office immediately so your medication list can be updated and reviewed by our pharmacist again. On day of admission: Tikosyn initiation requires a 3 night/4 day hospital stay with constant telemetry monitoring. You will have an EKG after each dose of Tikosyn as well as daily lab draws.  If the drug does not convert you to normal rhythm a cardioversion after the 4th dose of Tikosyn.  Afib Clinic office visit on the morning  of admission is needed for preliminary labs/ekg.  Time of admission is dependent on bed availability in the hospital. In some instances, you will be sent home until bed is available. Rarely admission can be delayed to the following day if hospital census prevents available beds.  You may bring personal belongings/clothing with you to the hospital.  Please leave your suitcase in the car until you arrive in admissions.  Questions please call our office at [phone]

## 2021-07-07 NOTE — Telephone Encounter (Signed)
Left message to further discuss.

## 2021-07-07 NOTE — Telephone Encounter (Signed)
Medication list reviewed in anticipation of upcoming Tikosyn initiation. Patient is not taking any contraindicated medications. She should try to minimize use of Imodium though as it's QTc prolonging (looks like this was addressed at EP visit today, had been taking every 4 days or so). Has been off of amiodarone for 3 months.  Patient is anticoagulated on Eliquis 5mg  BID on the appropriate dose. Please ensure that patient has not missed any anticoagulation doses in the 3 weeks prior to Tikosyn initiation.   Patient will need to be counseled to avoid use of Benadryl while on Tikosyn and in the 2-3 days prior to Tikosyn initiation.

## 2021-07-17 NOTE — Progress Notes (Signed)
PROVIDER NOTE: Information contained herein reflects review and annotations entered in association with encounter. Interpretation of such information and data should be left to medically-trained personnel. Information provided to patient can be located elsewhere in the medical record under "Patient Instructions". Document created using STT-dictation technology, any transcriptional errors that may result from process are unintentional.    Patient: Lauren Lloyd  Service Category: E/M  Provider: Gaspar Cola, MD  DOB: 05-26-1940  DOS: 07/19/2021  Specialty: Interventional Pain Management  MRN: [id number]  Setting: Ambulatory outpatient  PCP: [name], MD  Type: Established Patient    Referring Provider: [name], MD  Location: Office  Delivery: Face-to-face     Primary Reason(s) for Visit: Encounter for evaluation before starting new chronic pain management plan of care (Level of risk: moderate) CC: Back Pain (lower)  HPI  Lauren Lloyd is a 81 y.o. year old, female patient, who comes today for a follow-up evaluation to review the test results and decide on a treatment plan. She has Cervical pain; Acid reflux; Hypercholesteremia; IBS (irritable bowel syndrome); OSA (obstructive sleep apnea); L-S radiculopathy; Non-toxic uninodular goiter; Primary osteoarthritis of one hip; S/P TKR (total knee replacement), left; Pulmonary hypertension, unspecified ([place]); Essential hypertension; Hemorrhoids; Chronic right shoulder pain; Traumatic amputation of finger; Hematuria; Leg cramps; Metatarsalgia of both feet; Chronic diastolic CHF (congestive heart failure) ([place]); Centrilobular emphysema ([place]); Paresthesias; Persistent atrial fibrillation ([place]); Degenerative cervical spinal stenosis; Dyspnea on exertion; Obesity (BMI 30-39.9); Bradycardia; Avitaminosis D; Chronic fatigue; Insomnia; Chronic anticoagulation (Eliquis); Thoracic spondylosis; Primary osteoarthritis of left knee; Preoperative  evaluation to rule out surgical contraindication; Carotid artery disease ([place]); Chronic pain syndrome; Pharmacologic therapy; Disorder of skeletal system; Problems influencing health status; Abnormal MRI, lumbar spine (02/24/2021); Chronic low back pain (1ry area of Pain) (Bilateral) (R>L) w/o sciatica; Chronic sacroiliac joint pain (3ry area of Pain) (Right); Lumbar facet syndrome (Bilateral); Grade 1 Anterolisthesis of lumbar spine of L4/L5; Retrolisthesis of L1 over L2 and L2 over L3; Lumbar central spinal stenosis, w/o neurogenic claudication (L3-4, L4-5); Lumbosacral foraminal stenosis (Multilevel) (Bilateral); Lumbar lateral recess stenosis (Bilateral: L4-5); Lumbar facet arthropathy (Multilevel) (Bilateral); and Lumbar foraminal stenosis (Multilevel) (Bilateral) (R>L: T12-L1, L1-2, L5-S1) (L>R: L2-3, L3-4,) on their problem list. Her primarily concern today is the Back Pain (lower)  Pain Assessment: Location: Lower, Right Back Radiating: buttocks/hips  left Onset: More than a month ago Duration: Chronic pain Quality: Constant, Aching Severity: 4 /10 (subjective, self-reported pain score)  Effect on ADL: Prolonged walkin and standing, bending, getting out of chair Timing: Constant Modifying factors: rest, hot showers,heat BP: 135/88   HR:    Lauren Lloyd comes in today for a follow-up visit after her initial evaluation on 06/16/2021. Today we went over the results of her tests. These were explained in "Layman's terms". During today's appointment we went over my diagnostic impression, as well as the proposed treatment plan.  Review of initial evaluation (06/16/2021): "According to the patient the primary area of pain is the lower back (Bilateral) (R>L).  She denies any prior surgeries, she admits to having had 3 sessions of physical therapy around December 2022, which did help to some degree but she could not afford the $400 visits.  She admits to having had a recent lumbar MRI and she also indicates  having had some nerve blocks by Sharlet Salina, DO, which she indicated did not give her any relief of the pain and did not work.  Review of the initial evaluation by Dr. Sharlet Salina on  04/01/2019 indicates that the patient's primary area of pain was localized in the mid back at the level of the bra strap.  According to his note the pain started rather suddenly and severely around June 2022 requiring an ER evaluation.  X-rays and CT of the thoracic spine did not demonstrate any compression fractures.  He describes that the pain was felt to be mechanical and musculoskeletal.  According to him this pain has resolved.  [name] second area pain was that of the lumbosacral junction which he described worsen with standing and walking and improved with sitting and resting.  At that time the patient had no complaints of lower extremity pain, numbness, or weakness.  His lower extremity exam was completely negative.  His assessment was that of "acute on chronic the bilateral buttocks the bilateral buttocks."  He further indicates that the symptoms are consistent with neurogenic claudication versus facet mediated pain however he fails to describe any type of signs or symptoms of claudication and no patient description of such complaints.  Following the initial evaluation on 04/01/2019, the patient is next seen on 12/17/2019 for a bilateral S1 TFESI.  Again, at the time he documents that the patient is having chronic low back pain with minimal radiation into the bilateral buttocks and no lower extremity pain or radicular symptoms.  Procedures done by Sharlet Salina, DO: 12/17/2019: Bilateral S1 transforaminal ESI (good relief x3 weeks) 01/17/2020: Bilateral S1 transforaminal ESI (good relief until extensive walking in [place]) 03/16/2020: Bilateral S1 transforaminal ESI (good relief) 06/23/2020: Bilateral MBB to the L5-5 and L5-S1 facet joints (8/10 to 0/10) 07/07/2020: Bilateral MBB to the L4-5 and L5-S1 facet  joints (8/10 to 0/10) 09/30/2020: RFA to the bilateral L4-5 and L5-S1 facet joints 01/21/2021: Bilateral S1 transforaminal ESI  Today I have gone over the patient's recollection of all of the above procedures and her recollection does not match any of the information from Dr. Sharlet Salina notes.  The patient indicates that none of the procedures provided her with any short-term or long-term benefit.  She indicated that the 2 diagnostic facets only provided her with 20% relief of the pain for the duration of the local anesthetic.  She also indicates that the radiofrequency of the lumbar facets provided her with 0% improvement.  Review of the procedure notes from 06/23/2020 and 07/07/2020 indicated that he performed bilateral L3 and L4 medial branch and L5 dorsal ramus blocks, which on the 01/21/2021 note he described as medial branch blocks to the L4-5 and L5-S1 facet joints.  Physical exam: The patient was able to toe walk and heel walk without any problems at all.  Straight leg raise was negative bilaterally.  Lumbar flexion range of motion was within normal limits but the patient did experience some pain on the extension portion of the maneuver with a positive double stage recovery and pain referral towards the midline, suggestive of discogenic etiology.  Provocative Patrick maneuver was positive on the right side for sacroiliac joint arthralgia and negative bilaterally for any hip joint discomfort or significant decreased range of motion.  Hyperextension and rotation maneuver was positive bilaterally for lumbar facet arthralgia with the pain being ipsilateral to the side that the patient was turning into.  Lumbar spine lateral bending was completely negative bilaterally.  The patient denied any lower extremity pain, numbness, or weakness, with the exception of weakness on flexing her hips.  This weakness is still a 4/5, which could be associated with deconditioning since she does not exercise on  a regular basis and  she is 81 years old.  Lumbar spine imaging: (02/24/2021) LUMBAR MRI FINDINGS: Alignment: Slight (grade 1) degenerative anterolisthesis of L4 on L5, similar. Slight retrolisthesis of L1 on L2 and L2 on L3, similar. Similar upper lumbar levocurvature and lower lumbar dextrocurvature. Vertebrae: Chronic degenerative endplate signal changes at multiple levels.  DISC LEVELS: T12-L1: Disc height loss with mild disc bulging and moderate bilateral facet arthropathy. Is mild right greater than left foraminal stenosis. L1-2: Disc height loss with mild disc bulging and moderate bilateral facet arthropathy. Is mild right greater than left foraminal stenosis. L2-3: Broad disc bulge and mild facet hypertrophy. Similar mild left greater than right foraminal stenosis. L3-4: Broad disc bulge and moderate bilateral facet arthropathy. Mild left greater than right foraminal stenosis. Mild canal stenosis. L4-5: Grade 1 anterolisthesis, similar. Resulting uncovering the disc with slight disc bulge. Moderate bilateral facet arthropathy. Similar resulting moderate canal and bilateral subarticular recess stenosis. Similar mild to moderate bilateral foraminal stenosis. L5-S1: Disc height loss. Broad disc bulge with endplate spurring. Similar moderate to severe right and moderate left foraminal stenosis.  IMPRESSION: 1. At L5-S1, similar moderate to severe right and moderate left foraminal stenosis. 2. At L4-L5, similar moderate canal and bilateral subarticular recess stenosis with mild to moderate bilateral foraminal stenosis. 3. At L3-L4, similar mild canal and bilateral foraminal stenosis. 4. At L2-L3, similar mild bilateral foraminal stenosis.  Interpretation: Although the patient has several positive findings on the MRI, only the facet arthropathy seems to be of some significance.  Out of all of these findings that once capable of causing the clinical symptoms described by the patient seems to be those of the facet  arthropathy with some possible discogenic contribution from the spondylolisthesis.  Since there are no x-rays on flexion and extension to evaluate the stability of that spondylolisthesis, today we will be ordering some.  A more remote possibility that may explain the low back pain would be a radiculopathy affecting the L1 or L2 levels, but this is a more remote possibility since the patient denies having any groin pain.  The only thing that would go along with this is her complaints of weakness on hip flexion that is suggestive of psoas muscle weakness.  Today I went over the results on her lab work and x-rays and I have explained those to the patient in layman's terms.  I have also explained to the patient the reason why some of the procedures done by Dr. Sharlet Salina did not help her pain.  I have also explained to the patient what our plan of action would be.  She understood and accepted.  I will be bringing her back for bilateral L1 and L2 transforaminal ESI.  Controlled Substance Pharmacotherapy Assessment REMS (Risk Evaluation and Mitigation Strategy)  Opioid Analgesic: None MME/day: 0 mg/day  Pill Count: None expected due to no prior prescriptions written by our practice. Ignatius Specking, RN  07/19/2021  1:15 PM  Sign when Signing Visit Safety precautions to be maintained throughout the outpatient stay will include: orient to surroundings, keep bed in low position, maintain call bell within reach at all times, provide assistance with transfer out of bed and ambulation.     Monitoring: [place] PMP: PDMP reviewed during this encounter. Online review of the past 31-monthperiod previously conducted. Not applicable at this point since we have not taken over the patient's medication management yet. List of other Serum/Urine Drug Screening Test(s):  No results found for: AMPHSCRSER, BHarrold Donath  COCAINSCRSER, COCAINSCRNUR, PCPSCRSER, THCSCRSER, THCU, CANNABQUANT, OPIATESCRSER, OXYSCRSER,  [place], [facility] List of all UDS test(s) done:  Lab Results  Component Value Date   SUMMARY Note 06/16/2021   Last UDS on record: Summary  Date Value Ref Range Status  06/16/2021 Note  Final    Comment:    ==================================================================== Compliance Drug Analysis, Ur ==================================================================== Test                             Result       Flag       Units  Drug Absent but Declared for Prescription Verification   Metaxalone                     Not Detected UNEXPECTED ==================================================================== Test                      Result    Flag   Units      Ref Range   Creatinine              26               mg/dL      >=20 ==================================================================== Declared Medications:  The flagging and interpretation on this report are based on the  following declared medications.  Unexpected results may arise from  inaccuracies in the declared medications.   **Note: The testing scope of this panel includes these medications:   Metaxalone (Skelaxin)   **Note: The testing scope of this panel does not include the  following reported medications:   Apixaban (Eliquis)  Bisoprolol (Zebeta)  Calcium (Tums)  Cholecalciferol  Cyanocobalamin  Furosemide (Lasix)  Loperamide (Imodium)  Multivitamin  Potassium (Klor-Con) ==================================================================== For clinical consultation, please call [phone]. ====================================================================    UDS interpretation: No unexpected findings.          Medication Assessment Form: Patient introduced to form today Treatment compliance: Treatment may start today if patient agrees with proposed plan. Evaluation of compliance is not applicable at this point Risk Assessment Profile: Aberrant behavior: See initial evaluations. None  observed or detected today Comorbid factors increasing risk of overdose: See initial evaluation. No additional risks detected today Opioid risk tool (ORT):  Opioid Risk  07/19/2021  Alcohol 0  Illegal Drugs 0  Rx Drugs 0  Psychological Disease 0  Depression 0  Opioid Risk Tool Scoring 0  Opioid Risk Interpretation Low Risk    ORT Scoring interpretation table:  Score <3 = Low Risk for SUD  Score between 4-7 = Moderate Risk for SUD  Score >8 = High Risk for Opioid Abuse   Risk of substance use disorder (SUD): Low  Risk Mitigation Strategies:  Patient opioid safety counseling: Completed today. Counseling provided to patient as per "Patient Counseling Document". Document signed by patient, attesting to counseling and understanding Patient-Prescriber Agreement (PPA): Obtained today.  Controlled substance notification to other providers: Written and sent today.  Pharmacologic Plan: Non-opioid analgesic therapy offered. Interventional alternatives discussed.             Laboratory Chemistry Profile   Renal Lab Results  Component Value Date   BUN 20 02/12/2021   CREATININE 0.96 02/12/2021   BCR 21 02/12/2021   GFRAA 75 12/09/2019   GFRNONAA 65 12/08/2020   SPECGRAV 1.015 03/06/2018   PHUR 6.0 03/06/2018   PROTEINUR NEGATIVE 11/05/2018     Electrolytes Lab Results  Component Value Date   NA  144 02/12/2021   K 4.4 02/12/2021   CL 104 02/12/2021   CALCIUM 9.7 02/12/2021   MG 2.1 06/16/2021     Hepatic Lab Results  Component Value Date   AST 18 09/24/2020   ALT 14 09/24/2020   ALBUMIN 3.6 09/24/2020   ALKPHOS 73 09/24/2020     ID Lab Results  Component Value Date   SARSCOV2NAA NEGATIVE 06/07/2019     Bone Lab Results  Component Value Date   VD25OH 27.3 (L) 08/03/2020   25OHVITD1 54 06/16/2021   25OHVITD2 4.6 06/16/2021   25OHVITD3 49 06/16/2021     Endocrine Lab Results  Component Value Date   GLUCOSE 107 (H) 02/12/2021   GLUCOSEU NEGATIVE 11/05/2018    HGBA1C 5.5 12/09/2019   TSH 1.020 08/03/2020   FREET4 1.74 12/09/2019     Neuropathy Lab Results  Component Value Date   VITAMINB12 1,246 (H) 06/16/2021   HGBA1C 5.5 12/09/2019     CNS No results found for: COLORCSF, APPEARCSF, RBCCOUNTCSF, WBCCSF, POLYSCSF, LYMPHSCSF, EOSCSF, PROTEINCSF, GLUCCSF, JCVIRUS, CSFOLI, IGGCSF, LABACHR, ACETBL, LABACHR, ACETBL   Inflammation (CRP: Acute   ESR: Chronic) Lab Results  Component Value Date   CRP 5 06/16/2021   ESRSEDRATE 47 (H) 06/16/2021     Rheumatology Lab Results  Component Value Date   LABURIC 7.2 (H) 09/24/2020     Coagulation Lab Results  Component Value Date   INR 1.1 09/24/2020   LABPROT 14.1 09/24/2020   APTT 32 09/24/2020   PLT 286 02/12/2021     Cardiovascular Lab Results  Component Value Date   BNP 376.0 (H) 05/03/2018   TROPONINI <0.03 11/05/2018   HGB 14.1 02/12/2021   HCT 42.4 02/12/2021     Screening Lab Results  Component Value Date   SARSCOV2NAA NEGATIVE 06/07/2019   COVIDSOURCE NASOPHARYNGEAL 04/30/2019     Cancer No results found for: CEA, CA125, LABCA2   Allergens No results found for: ALMOND, APPLE, ASPARAGUS, AVOCADO, BANANA, BARLEY, BASIL, BAYLEAF, GREENBEAN, LIMABEAN, WHITEBEAN, BEEFIGE, REDBEET, BLUEBERRY, BROCCOLI, CABBAGE, MELON, CARROT, CASEIN, CASHEWNUT, CAULIFLOWER, CELERY     Note: Lab results reviewed.  Recent Diagnostic Imaging Review  Cervical Imaging: Cervical CT wo contrast: Results for orders placed during the hospital encounter of 05/30/18 CT CERVICAL SPINE WO CONTRAST  Narrative CLINICAL DATA:  LEFT arm pain and numbness, heart racing last night, radiculopathy, focal neural deficit of greater than 6 hours question stroke  EXAM: CT HEAD WITHOUT CONTRAST  CT CERVICAL SPINE WITHOUT CONTRAST  TECHNIQUE: Multidetector CT imaging of the head and cervical spine was performed following the standard protocol without intravenous contrast. Multiplanar CT image  reconstructions of the cervical spine were also generated.  COMPARISON:  None  Correlation: Cervical spine radiographs 08/20/2008  FINDINGS: CT HEAD FINDINGS  Brain: Mild age-related atrophy. Normal ventricular morphology. No midline shift or mass effect. Otherwise normal appearance of brain parenchyma. No intracranial hemorrhage, mass lesion or evidence acute infarction. No extra-axial fluid collections.  Vascular: Minimal atherosclerotic calcification of internal carotid arteries at skull base.  Skull: Intact  Sinuses/Orbits: Clear  Other: N/A  CT CERVICAL SPINE FINDINGS  Alignment: Minimal anterolisthesis C4-C5 unchanged. Remaining cervical spine alignment normal.  Skull base and vertebrae: Osseous mineralization normal. Visualized skull base intact. Multilevel facet degenerative changes. Vertebral body heights maintained. No fracture, additional subluxation, or bone destruction.  Soft tissues and spinal canal: Prevertebral soft tissues normal thickness. Asymmetric enlargement of the RIGHT thyroid lobe versus LEFT question mass 3.5 cm diameter.  Disc levels:  No  additional abnormalities  Upper chest: Visualized lung apices clear.  Other: N/A  IMPRESSION: No acute intracranial abnormalities.  Degenerative facet disease changes of the cervical spine.  No acute cervical spine abnormalities.  Question RIGHT thyroid mass 3.5 cm diameter; ultrasound assessment recommended to exclude tumor.   Electronically Signed By: Lavonia Dana M.D. On: 05/30/2018 18:00  Shoulder Imaging: Shoulder-R MR w/wo contrast: Results for orders placed during the hospital encounter of 05/29/17 MR SHOULDER RIGHT W WO CONTRAST  Narrative CLINICAL DATA:  Right shoulder pain with limited range of motion. Proximal right humerus lesion on x-ray.  EXAM: MRI OF THE RIGHT SHOULDER WITHOUT AND WITH CONTRAST  TECHNIQUE: Multiplanar, multisequence MR imaging of the right shoulder  was performed before and after the administration of intravenous contrast.  CONTRAST:  73m MULTIHANCE GADOBENATE DIMEGLUMINE 529 MG/ML IV SOLN  COMPARISON:  MRI dated 10/31/2007  FINDINGS: Rotator cuff: There is an articular surface partial thickness tear of the distal subscapularis tendon. The long head of the biceps tendon is dislocated into the torn subscapularis, best seen on image 13 of series 13. There are focal areas of tendinopathy of the distal infraspinatus and supraspinatus tendons as indicated by areas of enhancement after contrast administration. However, these do not appear to be tears on T2 weighted imaging.  Muscles:  Chronic marked atrophy of the teres minor muscle.  Biceps long head: Medially dislocated into the torn subscapularis tendon.  Acromioclavicular Joint: Mild AC joint arthropathy, slightly progressed since the prior study. Type 2 acromion. No bursitis  Glenohumeral Joint: No effusion or chondral defect.  Labrum:  Intact.  Bones: There is a 15 x 14 x 11 mm well-defined sclerotic lesion in the posterolateral aspect of the proximal humeral shaft at the origin of the lateral head of the triceps muscle. There is no enhancement after contrast administration. There is no muscle or bone edema of significance. Review of the prior MRI of 10/31/2007 demonstrates a tiny sclerotic area at that site. I think this most likely represents a benign "tug" lesion at the triceps origin, of no significance.  Minimal cystic degenerative changes of the posterior aspect of the greater tuberosity of the proximal humerus. Small marginal osteophytes on the inferior aspect of the head of the humerus.  Other: No adenopathy.  IMPRESSION: 1. Partial-thickness articular surface tear of the subscapularis tendon with dislocation of the long head of the biceps tendon into the torn subscapularis. 2. Focal tendinopathy of the distal infraspinatus and supraspinatus tendons  without a tear. 3. Benign sclerotic lesion of the posterolateral aspect of the humeral shaft at the origin of the lateral head of the triceps muscle. This most likely represents a benign tug lesion.   Electronically Signed By: JLorriane ShireM.D. On: 05/29/2017 16:09  Lumbosacral Imaging: Lumbar MR wo contrast: Results for orders placed during the hospital encounter of 02/23/21 MR LUMBAR SPINE WO CONTRAST  Narrative CLINICAL DATA:  Lumbar radiculopathy M54.16 (ICD-10-CM)  EXAM: MRI LUMBAR SPINE WITHOUT CONTRAST  TECHNIQUE: Multiplanar, multisequence MR imaging of the lumbar spine was performed. No intravenous contrast was administered.  COMPARISON:  MRI 11/24/2018.  FINDINGS: Segmentation: 5 non rib-bearing lumbar vertebral bodies. Same numbering system as on the prior MRI.  Alignment: Slight (grade 1) degenerative anterolisthesis of L4 on L5, similar. Slight retrolisthesis of L1 on L2 and L2 on L3, similar. Similar upper lumbar levocurvature and lower lumbar dextrocurvature.  Vertebrae: Chronic degenerative endplate signal changes at multiple levels. Otherwise, no focal marrow edema to suggest acute fracture or  discitis/osteomyelitis.  Conus medullaris and cauda equina: Conus extends to the L1-L2 level. Conus and cauda equina appear normal.  Paraspinal and other soft tissues: Unremarkable.  Disc levels:  T12-L1: Disc height loss with mild disc bulging and moderate bilateral facet arthropathy. Is mild right > left foraminal stenosis, similar. No significant canal stenosis.  L1-L2: Disc height loss with mild disc bulging and moderate bilateral facet arthropathy. Is mild right > left foraminal stenosis, similar. No significant canal stenosis.  L2-L3: Broad disc bulge and mild facet hypertrophy. Similar mild left > right foraminal stenosis. No significant canal stenosis.  L3-L4: Broad disc bulge and moderate bilateral facet arthropathy. Mild left > right  foraminal stenosis, similar. Mild canal stenosis, similar.  L4-L5: Grade 1 anterolisthesis, similar. Resulting uncovering the disc with slight disc bulge. Moderate bilateral facet arthropathy. Similar resulting moderate canal and bilateral subarticular recess stenosis. Similar mild to moderate bilateral foraminal stenosis.  L5-S1: Disc height loss. Broad disc bulge with endplate spurring. Similar moderate to severe right and moderate left foraminal stenosis. No significant canal stenosis.  IMPRESSION: 1. At L5-S1, similar moderate to severe right and moderate left foraminal stenosis. 2. At L4-L5, similar moderate canal and bilateral subarticular recess stenosis with mild to moderate bilateral foraminal stenosis. 3. At L3-L4, similar mild canal and bilateral foraminal stenosis. 4. At L2-L3, similar mild bilateral foraminal stenosis.   Electronically Signed By: Margaretha Sheffield M.D. On: 02/24/2021 16:26  Lumbar DG (Complete) 4+V: Results for orders placed during the hospital encounter of 07/26/18 DG Lumbar Spine Complete  Narrative CLINICAL DATA:  Low back pain.  No known injury.  EXAM: LUMBAR SPINE - COMPLETE 4+ VIEW  COMPARISON:  03/06/2018.  FINDINGS: Lumbar spine numbered with the lowest segmented appearing lumbar shaped vertebral body on lateral view as L5. Mild S-shaped thoracolumbar spine scoliosis. Diffuse osteopenia. Diffuse multilevel degenerative change. 3 mm retrolisthesis T12 on L1. 2 mm retrolisthesis L1 on L2. 3 mm anterolisthesis L4 on L5. Aortoiliac atherosclerotic vascular calcification.  IMPRESSION: 1. Mild S-shaped thoracolumbar spine scoliosis. Diffuse osteopenia. Diffuse multilevel degenerative change. 3 mm retrolisthesis T12 on L1. 2 mm retrolisthesis L1 on L2. 3 mm anterolisthesis L4 on L5.  2.  Aortoiliac atherosclerotic vascular disease.   Electronically Signed By: Marcello Moores  Register On: 07/27/2018 05:55  Lumbar DG Bending views: Results  for orders placed during the hospital encounter of 06/16/21 DG Lumbar Spine Complete W/Bend  Narrative CLINICAL DATA:  Low back pain; Sacroiliac joint pain  EXAM: LUMBAR SPINE - COMPLETE WITH BENDING VIEWS; BILATERAL SACROILIAC JOINTS - 3+ VIEW  COMPARISON:  None.  FINDINGS: Lumbar spine:  There are 5 non-rib-bearing lumbar-type vertebral bodies. There is trace stepwise retrolisthesis of the thoracolumbar junction involving T12-L1, L1-L2, and L2-L3. There is trace anterolisthesis of L4 on L5. These are fixed without dynamic instability on flexion-extension views. Multilevel degenerative changes of the thoracolumbar junction and lumbar spine, appear most advanced and severe at T12-L1, L1-L2, and L5-S1. The lumbar vertebral body heights are maintained without compression deformity. Generalized osteopenia. Atherosclerotic calcifications of the abdominal aorta.  Sacroiliac joints:  No acute fracture. Generalized osteopenia. Mild degenerative changes of the left SI joint. Moderate degenerative changes of the left hip demonstrated by superior joint space loss.  IMPRESSION: Lumbar spine, sacroiliac joints:  1. Advanced multilevel degenerative changes of the thoracolumbar junction and lumbar spine as described. Multilevel spondylolisthesis without dynamic instability on flexion-extension views. 2. Mild degenerative changes of the left SI joint and moderate degenerative changes of the left hip.  Electronically Signed By: Albin Felling M.D. On: 06/17/2021 10:47  Sacroiliac Joint Imaging: Sacroiliac Joint DG: Results for orders placed during the hospital encounter of 06/16/21 DG Si Joints  Narrative CLINICAL DATA:  Low back pain; Sacroiliac joint pain  EXAM: LUMBAR SPINE - COMPLETE WITH BENDING VIEWS; BILATERAL SACROILIAC JOINTS - 3+ VIEW  COMPARISON:  None.  FINDINGS: Lumbar spine:  There are 5 non-rib-bearing lumbar-type vertebral bodies. There is trace stepwise  retrolisthesis of the thoracolumbar junction involving T12-L1, L1-L2, and L2-L3. There is trace anterolisthesis of L4 on L5. These are fixed without dynamic instability on flexion-extension views. Multilevel degenerative changes of the thoracolumbar junction and lumbar spine, appear most advanced and severe at T12-L1, L1-L2, and L5-S1. The lumbar vertebral body heights are maintained without compression deformity. Generalized osteopenia. Atherosclerotic calcifications of the abdominal aorta.  Sacroiliac joints:  No acute fracture. Generalized osteopenia. Mild degenerative changes of the left SI joint. Moderate degenerative changes of the left hip demonstrated by superior joint space loss.  IMPRESSION: Lumbar spine, sacroiliac joints:  1. Advanced multilevel degenerative changes of the thoracolumbar junction and lumbar spine as described. Multilevel spondylolisthesis without dynamic instability on flexion-extension views. 2. Mild degenerative changes of the left SI joint and moderate degenerative changes of the left hip.   Electronically Signed By: Albin Felling M.D. On: 06/17/2021 10:47  Knee Imaging: Knee-L DG 4 views: Results for orders placed during the hospital encounter of 06/16/16 DG Knee Complete 4 Views Left  Narrative CLINICAL DATA:  Pain.  EXAM: LEFT KNEE - COMPLETE 4+ VIEW  COMPARISON:  No recent.  FINDINGS: No acute bony or joint abnormality identified. Degenerative changes noted about the left knee. Diffuse changes most prominent about the medial joint space. Small loose body cannot be excluded.  IMPRESSION: Diffuse tricompartment degenerative change with degenerative changes most prominent about the and medial joint space. Small loose body cannot be excluded.   Electronically Signed By: Marcello Moores  Register On: 06/16/2016 12:38  Foot Imaging: Foot-L DG Complete: Results for orders placed in visit on 07/31/17 DG Foot Complete  Left  Narrative Please see detailed radiograph report in office note.  Wrist Imaging: Wrist-R DG Complete: Results for orders placed during the hospital encounter of 09/24/20 DG Wrist Complete Right  Narrative CLINICAL DATA:  Right wrist pain and swelling.  No reported injury.  EXAM: RIGHT WRIST - COMPLETE 3+ VIEW  COMPARISON:  None.  FINDINGS: No fracture or dislocation. No suspicious focal osseous lesions. Severe first carpometacarpal joint osteoarthritis. Moderate interphalangeal joint right thumb osteoarthritis. Mild scattered chondrocalcinosis in the right wrist, most prominent adjacent to the radial margin of the distal scaphoid and at the triangular fibrocartilage. No radiopaque foreign bodies.  IMPRESSION: 1. Severe first carpometacarpal joint osteoarthritis. 2. Mild scattered chondrocalcinosis in the right wrist, suggesting CPPD arthropathy.   Electronically Signed By: Ilona Sorrel M.D. On: 09/24/2020 12:27  Complexity Note: Imaging results reviewed. Results shared with Ms. Froio, using Layman's terms.                        Meds   Current Outpatient Medications:    acetaminophen (TYLENOL) 325 MG tablet, Take 650 mg by mouth every 6 (six) hours as needed., Disp: , Rfl:    bisoprolol (ZEBETA) 5 MG tablet, Take 1.5 tablets (7.5 mg total) by mouth in the morning and at bedtime., Disp: 270 tablet, Rfl: 0   calcium carbonate (TUMS EX) 750 MG chewable tablet, Chew 2 tablets by mouth daily  as needed for heartburn., Disp: , Rfl:    Cholecalciferol 25 MCG (1000 UT) tablet, Take 1,000 Units by mouth daily., Disp: , Rfl:    ELIQUIS 5 MG TABS tablet, TAKE ONE TABLET BY MOUTH TWICE DAILY, Disp: 180 tablet, Rfl: 1   furosemide (LASIX) 40 MG tablet, Take 1 tablet (40 mg total) by mouth daily. Take extra 40 mg as needed after lunch for abdominal swelling, leg swelling, or shortness of breath, Disp: 100 tablet, Rfl: 3   loperamide (IMODIUM) 2 MG capsule, Take 2 mg by mouth as  needed for diarrhea or loose stools., Disp: , Rfl:    metaxalone (SKELAXIN) 800 MG tablet, Take 800 mg by mouth daily as needed for muscle spasms., Disp: , Rfl:    Multiple Vitamin (MULTIVITAMIN) capsule, Take 1 capsule by mouth daily., Disp: , Rfl:    potassium chloride (KLOR-CON) 10 MEQ tablet, TAKE 1 TABLET BY MOUTH DAILY, Disp: 30 tablet, Rfl: 5  ROS  Constitutional: Denies any fever or chills Gastrointestinal: No reported hemesis, hematochezia, vomiting, or acute GI distress Musculoskeletal: Denies any acute onset joint swelling, redness, loss of ROM, or weakness Neurological: No reported episodes of acute onset apraxia, aphasia, dysarthria, agnosia, amnesia, paralysis, loss of coordination, or loss of consciousness  Allergies  Ms. Evans is allergic to levofloxacin, influenza vaccines, and oysters [shellfish allergy].  PFSH  Drug: Ms. Bellin  reports no history of drug use. Alcohol:  reports current alcohol use of about 7.0 standard drinks per week. Tobacco:  reports that she quit smoking about 32 years ago. Her smoking use included cigarettes. She has a 30.00 pack-year smoking history. She has never used smokeless tobacco. Medical:  has a past medical history of (HFpEF) heart failure with preserved ejection fraction ([place]), Arthritis, Arthritis of knee, Back pain, Carotid arterial disease ([place]), Cholelithiasis, Edema, lower extremity, Fatty liver, GERD (gastroesophageal reflux disease), History of stress test, Knee pain, Lactose intolerance, Mitral regurgitation, Multinodular goiter, Obesity, OSA (obstructive sleep apnea), PAF (paroxysmal atrial fibrillation) ([place]), PAH (pulmonary artery hypertension) ([place]), Scoliosis, SOB (shortness of breath), and Swallowing difficulty. Surgical: Ms. Hebert  has a past surgical history that includes Hammer toe surgery (05/16/2008); Skin graft (Left, 04/08/2013); Replacement total knee (Right, 05/17/2007); Knee arthroscopy (Right, 05/16/2002); Tonsillectomy  (05/16/1944); Cataract extraction w/PHACO (Right, 01/25/2016); Cataract extraction w/PHACO (Left, 02/22/2016); CARDIOVERSION (N/A, 06/15/2018); Total knee arthroplasty (Left, 12/02/2020); and Cardioversion (N/A, 02/18/2021). Family: family history includes Atrial fibrillation in her sister and sister; Breast cancer (age of onset: 50) in her sister; Healthy in her brother; Heart attack in her father; Heart disease in her mother; High blood pressure in her father; Hyperlipidemia in her sister and sister; Stroke in her father; Transient ischemic attack in her mother.  Constitutional Exam  General appearance: Well nourished, well developed, and well hydrated. In no apparent acute distress Vitals:   07/19/21 1307  BP: 135/88  Resp: 16  Temp: (!) 97.2 F (36.2 C)  SpO2: 99%  Weight: 216 lb (98 kg)  Height: '5\' 7"'  (1.702 m)   BMI Assessment: Estimated body mass index is 33.83 kg/m as calculated from the following:   Height as of this encounter: '5\' 7"'  (1.702 m).   Weight as of this encounter: 216 lb (98 kg).  BMI interpretation table: BMI level Category Range association with higher incidence of chronic pain  <18 kg/m2 Underweight   18.5-24.9 kg/m2 Ideal body weight   25-29.9 kg/m2 Overweight Increased incidence by 20%  30-34.9 kg/m2 Obese (Class I) Increased incidence by 68%  35-39.9 kg/m2 Severe obesity (Class II) Increased incidence by 136%  >40 kg/m2 Extreme obesity (Class III) Increased incidence by 254%   Patient's current BMI Ideal Body weight  Body mass index is 33.83 kg/m. Ideal body weight: 61.6 kg (135 lb 12.9 oz) Adjusted ideal body weight: 76.2 kg (167 lb 14.1 oz)   BMI Readings from Last 4 Encounters:  07/19/21 33.83 kg/m  07/07/21 33.83 kg/m  06/16/21 33.52 kg/m  04/20/21 34.98 kg/m   Wt Readings from Last 4 Encounters:  07/19/21 216 lb (98 kg)  07/07/21 216 lb (98 kg)  06/16/21 214 lb (97.1 kg)  04/20/21 220 lb (99.8 kg)    Psych/Mental status: Alert, oriented  x 3 (person, place, & time)       Eyes: PERLA Respiratory: No evidence of acute respiratory distress  Assessment & Plan  Primary Diagnosis & Pertinent Problem List: The primary encounter diagnosis was Chronic pain syndrome. Diagnoses of Chronic low back pain (1ry area of Pain) (Bilateral) (R>L) w/o sciatica, Abnormal MRI, lumbar spine (02/24/2021), Grade 1 Anterolisthesis of lumbar spine of L4/L5, Lumbar central spinal stenosis, w/o neurogenic claudication (L3-4, L4-5), Lumbar lateral recess stenosis (Bilateral: L4-5), Retrolisthesis of L1 over L2 and L2 over L3, Lumbar facet arthropathy (Multilevel) (Bilateral), Lumbar facet syndrome (Bilateral), Lumbar foraminal stenosis (Multilevel) (Bilateral) (R>L: T12-L1, L1-2, L5-S1) (L>R: L2-3, L3-4,), Lumbosacral foraminal stenosis (Multilevel) (Bilateral), and Chronic anticoagulation (Eliquis) were also pertinent to this visit.  Visit Diagnosis: 1. Chronic pain syndrome   2. Chronic low back pain (1ry area of Pain) (Bilateral) (R>L) w/o sciatica   3. Abnormal MRI, lumbar spine (02/24/2021)   4. Grade 1 Anterolisthesis of lumbar spine of L4/L5   5. Lumbar central spinal stenosis, w/o neurogenic claudication (L3-4, L4-5)   6. Lumbar lateral recess stenosis (Bilateral: L4-5)   7. Retrolisthesis of L1 over L2 and L2 over L3   8. Lumbar facet arthropathy (Multilevel) (Bilateral)   9. Lumbar facet syndrome (Bilateral)   10. Lumbar foraminal stenosis (Multilevel) (Bilateral) (R>L: T12-L1, L1-2, L5-S1) (L>R: L2-3, L3-4,)   11. Lumbosacral foraminal stenosis (Multilevel) (Bilateral)   12. Chronic anticoagulation (Eliquis)    Problems updated and reviewed during this visit: Problem  Lumbar foraminal stenosis (Multilevel) (Bilateral) (R>L: T12-L1, L1-2, L5-S1) (L>R: L2-3, L3-4,)   (02/24/2021) LUMBAR MRI FINDINGS: Alignment: Slight (grade 1) degenerative anterolisthesis of L4 on L5, similar. Slight retrolisthesis of L1 on L2 and L2 on L3, similar. Similar  upper lumbar levocurvature and lower lumbar dextrocurvature.   LEVELS: T12-L1: mild right > left foraminal stenosis. L1-2: mild right > left foraminal stenosis. L2-3: mild left > right foraminal stenosis. L3-4: Mild left > right foraminal stenosis. L4-5: Grade 1 anterolisthesis, mild to moderate bilateral foraminal stenosis. L5-S1: moderate to severe right and moderate left foraminal stenosis.   Abnormal MRI, lumbar spine (02/24/2021)   (02/24/2021) LUMBAR MRI FINDINGS: Alignment: Slight (grade 1) degenerative anterolisthesis of L4 on L5, similar. Slight retrolisthesis of L1 on L2 and L2 on L3, similar. Similar upper lumbar levocurvature and lower lumbar dextrocurvature. Vertebrae: Chronic degenerative endplate signal changes at multiple levels.  DISC LEVELS: T12-L1: Disc height loss with mild disc bulging and moderate bilateral facet arthropathy. Is mild right > left foraminal stenosis. L1-2: Disc height loss with mild disc bulging and moderate bilateral facet arthropathy. Is mild right > left foraminal stenosis. L2-3: Broad disc bulge and mild facet hypertrophy. Similar mild left > right foraminal stenosis. L3-4: Broad disc bulge and moderate bilateral facet arthropathy. Mild left > right foraminal  stenosis. Mild canal stenosis. L4-5: Grade 1 anterolisthesis, similar. Resulting uncovering the disc with slight disc bulge. Moderate bilateral facet arthropathy. Similar resulting moderate canal and bilateral subarticular recess stenosis. Similar mild to moderate bilateral foraminal stenosis. L5-S1: Disc height loss. Broad disc bulge with endplate spurring. Similar moderate to severe right and moderate left foraminal stenosis.  IMPRESSION: 1. At L5-S1, similar moderate to severe right and moderate left foraminal stenosis. 2. At L4-L5, similar moderate canal and bilateral subarticular recess stenosis with mild to moderate bilateral foraminal stenosis. 3. At L3-L4, similar mild canal and  bilateral foraminal stenosis. 4. At L2-L3, similar mild bilateral foraminal stenosis.   Lumbosacral foraminal stenosis (Multilevel) (Bilateral)   (02/24/2021) LUMBAR MRI FINDINGS: LEVELS: L5-S1: severe right and moderate left foraminal stenosis.   Chronic anticoagulation (Eliquis)   Eliquis ANTICOAGULATION: (Stop: 3 days   Restart: 6 hours)     Plan of Care  Pharmacotherapy (Medications Ordered): No orders of the defined types were placed in this encounter.  Procedure Orders         Lumbar Transforaminal Epidural     Lab Orders  No laboratory test(s) ordered today   Imaging Orders  No imaging studies ordered today   Referral Orders  No referral(s) requested today    Pharmacological management options:  Opioid Analgesics: I will not be prescribing any opioids at this time Membrane stabilizer: I will not be prescribing any at this time Muscle relaxant: I will not be prescribing any at this time NSAID: I will not be prescribing any at this time Other analgesic(s): I will not be prescribing any at this time      Interventional Therapies  Risk   Complexity Considerations:   Estimated body mass index is 33.52 kg/m as calculated from the following:   Height as of this encounter: '5\' 7"'  (1.702 m).   Weight as of this encounter: 214 lb (97.1 kg). Eliquis ANTICOAGULATION: (Stop: 3 days   Restart: 6 hours)   Planned   Pending:   Diagnostic bilateral L1 and L2 TFESI #1    Under consideration:   Diagnostic bilateral T12-L1 and L1-2 TFESI #1  Diagnostic bilateral lumbar facet MBB (T12-L5) #1    Completed:   None at this time   Completed by Sharlet Salina, DO:   01/21/2021: Bilateral S1 transforaminal ESI (no benefit)  09/30/2020: RFA to the bilateral L4-5 and L5-S1 facet joints (no benefit)  07/07/2020: Bilateral MBB to the L4-5 and L5-S1 facet joints (8/10 to 0/10) (20% benefit according to patient)  06/23/2020: Bilateral MBB to the L5-5 and L5-S1 facet joints (8/10 to  0/10) (20% benefit according to patient)  03/16/2020: Bilateral S1 transforaminal ESI (good relief)  01/17/2020: Bilateral S1 transforaminal ESI (good relief until extensive walking in [place])  12/17/2019: Bilateral S1 transforaminal ESI (good relief x3 weeks)    Therapeutic   Palliative (PRN) options:   None established    Provider-requested follow-up: Return for (Clinic) procedure: (B) L1 & L2 TFESI #1. Recent Visits Date Type Provider Dept  06/16/21 Office Visit Milinda Pointer, MD [facility]  Showing recent visits within past 90 days and meeting all other requirements Today's Visits Date Type Provider Dept  07/19/21 Office Visit Milinda Pointer, MD [facility]  Showing today's visits and meeting all other requirements Future Appointments No visits were found meeting these conditions. Showing future appointments within next 90 days and meeting all other requirements  Primary Care Physician: [name], MD Note by: Gaspar Cola, MD Date:  07/19/2021; Time: 2:02 PM

## 2021-07-19 ENCOUNTER — Encounter: Payer: Self-pay | Admitting: Pain Medicine

## 2021-07-19 ENCOUNTER — Other Ambulatory Visit: Payer: Self-pay

## 2021-07-19 ENCOUNTER — Ambulatory Visit: Payer: Medicare HMO | Attending: Pain Medicine | Admitting: Pain Medicine

## 2021-07-19 VITALS — BP 135/88 | Temp 97.2°F | Resp 16 | Ht 67.0 in | Wt 216.0 lb

## 2021-07-19 DIAGNOSIS — M4310 Spondylolisthesis, site unspecified: Secondary | ICD-10-CM | POA: Diagnosis not present

## 2021-07-19 DIAGNOSIS — Z7901 Long term (current) use of anticoagulants: Secondary | ICD-10-CM

## 2021-07-19 DIAGNOSIS — M4316 Spondylolisthesis, lumbar region: Secondary | ICD-10-CM | POA: Diagnosis not present

## 2021-07-19 DIAGNOSIS — G894 Chronic pain syndrome: Secondary | ICD-10-CM | POA: Diagnosis not present

## 2021-07-19 DIAGNOSIS — M4807 Spinal stenosis, lumbosacral region: Secondary | ICD-10-CM

## 2021-07-19 DIAGNOSIS — R937 Abnormal findings on diagnostic imaging of other parts of musculoskeletal system: Secondary | ICD-10-CM | POA: Diagnosis not present

## 2021-07-19 DIAGNOSIS — G8929 Other chronic pain: Secondary | ICD-10-CM | POA: Diagnosis not present

## 2021-07-19 DIAGNOSIS — M47816 Spondylosis without myelopathy or radiculopathy, lumbar region: Secondary | ICD-10-CM | POA: Insufficient documentation

## 2021-07-19 DIAGNOSIS — M545 Low back pain, unspecified: Secondary | ICD-10-CM | POA: Diagnosis not present

## 2021-07-19 DIAGNOSIS — M48061 Spinal stenosis, lumbar region without neurogenic claudication: Secondary | ICD-10-CM | POA: Diagnosis not present

## 2021-07-19 NOTE — Patient Instructions (Addendum)
______________________________________________________________________ ° °Preparing for Procedure with Sedation ° °NOTICE: Due to recent regulatory changes, starting on December 14, 2020, procedures requiring intravenous (IV) sedation will no longer be performed at the [facility].  These types of procedures are required to be performed at [facility] ambulatory surgery facility.  We are very sorry for the inconvenience. ° °Procedure appointments are limited to planned procedures: °No Prescription Refills. °No disability issues will be discussed. °No medication changes will be discussed. ° °Instructions: °Oral Intake: Do not eat or drink anything for at least 8 hours prior to your procedure. (Exception: Blood Pressure Medication. See below.) °Transportation: A driver is required. You may not drive yourself after the procedure. °Blood Pressure Medicine: Do not forget to take your blood pressure medicine with a sip of water the morning of the procedure. If your Diastolic (lower reading) is above 100 mmHg, elective cases will be cancelled/rescheduled. °Blood thinners: These will need to be stopped for procedures. Notify our staff if you are taking any blood thinners. Depending on which one you take, there will be specific instructions on how and when to stop it. °Diabetics on insulin: Notify the staff so that you can be scheduled 1st case in the morning. If your diabetes requires high dose insulin, take only ½ of your normal insulin dose the morning of the procedure and notify the staff that you have done so. °Preventing infections: Shower with an antibacterial soap the morning of your procedure. °Build-up your immune system: Take 1000 mg of Vitamin C with every meal (3 times a day) the day prior to your procedure. °Antibiotics: Inform the staff if you have a condition or reason that requires you to take antibiotics before dental procedures. °Pregnancy: If you are pregnant, call and cancel the procedure. °Sickness: If  you have a cold, fever, or any active infections, call and cancel the procedure. °Arrival: You must be in the facility at least 30 minutes prior to your scheduled procedure. °Children: Do not bring children with you. °Dress appropriately: Bring dark clothing that you would not mind if they get stained. °Valuables: Do not bring any jewelry or valuables. ° °Reasons to call and reschedule or cancel your procedure: (Following these recommendations will minimize the risk of a serious complication.) °Surgeries: Avoid having procedures within 2 weeks of any surgery. (Avoid for 2 weeks before or after any surgery). °Flu Shots: Avoid having procedures within 2 weeks of a flu shots. (Avoid for 2 weeks before or after immunizations). °Barium: Avoid having a procedure within 7-10 days after having had a radiological study involving the use of radiological contrast. (Myelograms, Barium swallow or enema study). °Heart attacks: Avoid any elective procedures or surgeries for the initial 6 months after a "Myocardial Infarction" (Heart Attack). °Blood thinners: It is imperative that you stop these medications before procedures. Let us know if you if you take any blood thinner.  °Infection: Avoid procedures during or within two weeks of an infection (including chest colds or gastrointestinal problems). Symptoms associated with infections include: Localized redness, fever, chills, night sweats or profuse sweating, burning sensation when voiding, cough, congestion, stuffiness, runny nose, sore throat, diarrhea, nausea, vomiting, cold or Flu symptoms, recent or current infections. It is specially important if the infection is over the area that we intend to treat. °Heart and lung problems: Symptoms that may suggest an active cardiopulmonary problem include: cough, chest pain, breathing difficulties or shortness of breath, dizziness, ankle swelling, uncontrolled high or unusually low blood pressure, and/or palpitations. If you are    experiencing any of these symptoms, cancel your procedure and contact your primary care physician for an evaluation. ° °Remember:  °Regular Business hours are:  °Monday to Thursday 8:00 AM to 4:00 PM ° °Provider's Schedule: °Lauren Hunkins, MD:  °Procedure days: Tuesday and Thursday 7:30 AM to 4:00 PM ° °Lauren Lateef, MD:  °Procedure days: Monday and Wednesday 7:30 AM to 4:00 PM °______________________________________________________________________ ° ____________________________________________________________________________________________ ° °General Risks and Possible Complications ° °Patient Responsibilities: It is important that you read this as it is part of your informed consent. It is our duty to inform you of the risks and possible complications associated with treatments offered to you. It is your responsibility as a patient to read this and to ask questions about anything that is not clear or that you believe was not covered in this document. ° °Patient’s Rights: You have the right to refuse treatment. You also have the right to change your mind, even after initially having agreed to have the treatment done. However, under this last option, if you wait until the last second to change your mind, you may be charged for the materials used up to that point. ° °Introduction: Medicine is not an exact science. Everything in Medicine, including the lack of treatment(s), carries the potential for danger, harm, or loss (which is by definition: Risk). In Medicine, a complication is a secondary problem, condition, or disease that can aggravate an already existing one. All treatments carry the risk of possible complications. The fact that a side effects or complications occurs, does not imply that the treatment was conducted incorrectly. It must be clearly understood that these can happen even when everything is done following the highest safety standards. ° °No treatment: You can choose not to proceed with the  proposed treatment alternative. The “PRO(s)” would include: avoiding the risk of complications associated with the therapy. The “CON(s)” would include: not getting any of the treatment benefits. These benefits fall under one of three categories: diagnostic; therapeutic; and/or palliative. Diagnostic benefits include: getting information which can ultimately lead to improvement of the disease or symptom(s). Therapeutic benefits are those associated with the successful treatment of the disease. Finally, palliative benefits are those related to the decrease of the primary symptoms, without necessarily curing the condition (example: decreasing the pain from a flare-up of a chronic condition, such as incurable terminal cancer). ° °General Risks and Complications: These are associated to most interventional treatments. They can occur alone, or in combination. They fall under one of the following six (6) categories: no benefit or worsening of symptoms; bleeding; infection; nerve damage; allergic reactions; and/or death. °No benefits or worsening of symptoms: In Medicine there are no guarantees, only probabilities. No healthcare provider can ever guarantee that a medical treatment will work, they can only state the probability that it may. Furthermore, there is always the possibility that the condition may worsen, either directly, or indirectly, as a consequence of the treatment. °Bleeding: This is more common if the patient is taking a blood thinner, either prescription or over the counter (example: Goody Powders, Fish oil, Aspirin, Garlic, etc.), or if suffering a condition associated with impaired coagulation (example: Hemophilia, cirrhosis of the liver, low platelet counts, etc.). However, even if you do not have one on these, it can still happen. If you have any of these conditions, or take one of these drugs, make sure to notify your treating physician. °Infection: This is more common in patients with a compromised  immune system, either due to disease (example:   diabetes, cancer, human immunodeficiency virus [HIV], etc.), or due to medications or treatments (example: therapies used to treat cancer and rheumatological diseases). However, even if you do not have one on these, it can still happen. If you have any of these conditions, or take one of these drugs, make sure to notify your treating physician. Nerve Damage: This is more common when the treatment is an invasive one, but it can also happen with the use of medications, such as those used in the treatment of cancer. The damage can occur to small secondary nerves, or to large primary ones, such as those in the spinal cord and brain. This damage may be temporary or permanent and it may lead to impairments that can range from temporary numbness to permanent paralysis and/or brain death. Allergic Reactions: Any time a substance or material comes in contact with our body, there is the possibility of an allergic reaction. These can range from a mild skin rash (contact dermatitis) to a severe systemic reaction (anaphylactic reaction), which can result in death. Death: In general, any medical intervention can result in death, most of the time due to an unforeseen complication. ____________________________________________________________________________________________ ____________________________________________________________________________________________  Pain Prevention Technique  Definition:   A technique used to minimize the effects of an activity known to cause inflammation or swelling, which in turn leads to an increase in pain.  Purpose: To prevent swelling from occurring. It is based on the fact that it is easier to prevent swelling from happening than it is to get rid of it, once it occurs.  Contraindications: Anyone with allergy or hypersensitivity to the recommended medications. Anyone taking anticoagulants (Blood Thinners) (e.g., Coumadin, Warfarin,  Plavix, etc.). Patients in Renal Failure.  Technique: Before you undertake an activity known to cause pain, or a flare-up of your chronic pain, and before you experience any pain, do the following:  On a full stomach, take 4 (four) over the counter Ibuprofens '200mg'$  tablets (Motrin), for a total of 800 mg. In addition, take over the counter Magnesium 400 to 500 mg, before doing the activity.  Six (6) hours later, again on a full stomach, repeat the Ibuprofen. That night, take a warm shower and stretch under the running warm water.  This technique may be sufficient to abort the pain and discomfort before it happens. Keep in mind that it takes a lot less medication to prevent swelling than it takes to eliminate it once it occurs.  ____________________________________________________________________________________________

## 2021-07-19 NOTE — Progress Notes (Signed)
Safety precautions to be maintained throughout the outpatient stay will include: orient to surroundings, keep bed in low position, maintain call bell within reach at all times, provide assistance with transfer out of bed and ambulation.  

## 2021-07-20 ENCOUNTER — Telehealth: Payer: Self-pay | Admitting: Cardiovascular Disease

## 2021-07-20 NOTE — Telephone Encounter (Signed)
? ?  Pre-operative Risk Assessment  ?  ?Patient Name: Lauren Lloyd  ?DOB: Jul 05, 1940 ?MRN: [id number]  ? ?  ? ?Request for Surgical Clearance   ? ?Procedure:   Lumbar facets procedure  ? ?Date of Surgery:  Clearance TBD                              ?   ?Surgeon:  not indicated  ?Surgeon's Group or Practice Name:  [facility] ?Phone number:  [phone] ?Fax number:  [phone] / [phone] ?  ?Type of Clearance Requested:   ?- Pharmacy:  Hold Apixaban (Eliquis) 3 days prior ?  ?Type of Anesthesia:  Not Indicated ?  ?Additional requests/questions:   ? ?Signed, ?Caryl Pina Gerringer   ?07/20/2021, 4:13 PM  ? ?

## 2021-07-21 ENCOUNTER — Telehealth: Payer: Self-pay | Admitting: Pain Medicine

## 2021-07-21 ENCOUNTER — Telehealth: Payer: Self-pay | Admitting: *Deleted

## 2021-07-21 NOTE — Telephone Encounter (Signed)
Patient with diagnosis of A Fib on Eliquis for anticoagulation.   ? ?Procedure:  Lumbar facets procedure  ?Date of procedure: TBD ? ?Of note, per Dr. Mardene Speak note on 07/07/21, patient may be scheduled in May for [place] admission.  Patient will not be able to hold Eliquis for 3 weeks before admission ? ? ?CHA2DS2-VASc Score = 6  ?This indicates a 9.7% annual risk of stroke. ?[name] score is based upon: ?CHF History: 1 ?HTN History: 1 ?Diabetes History: 0 ?Stroke History: 0 ?Vascular Disease History: 1 ?Age Score: 2 ?Gender Score: 1 ?  ? ?CrCl 71 mL/min ?Platelet count 286K ? ?Per office protocol, patient can hold Eliquis for 3 days prior to procedure.   ? ?

## 2021-07-21 NOTE — Telephone Encounter (Signed)
ok.  basically she has to be anticoagulated without interruption for 3 weeks before Tikosyn admission (which also has not been scheduled yet).  so if you were to do the procedure relatively soon, then yes, she can hold for 3 days.  Otherwise it would have to wait until after she started on Tikosyn.  and then we would probably have to clear her again ? ? ?Note from Rollen Sox, [facility] Attempted to call patient, message left. ?

## 2021-07-21 NOTE — Telephone Encounter (Signed)
Spoke with patient, we have not yet received clearance to stop Eliquis 3 days. Message sent to cardiology office requesting this. ?

## 2021-07-22 ENCOUNTER — Telehealth: Payer: Self-pay | Admitting: *Deleted

## 2021-07-22 NOTE — Telephone Encounter (Signed)
Spoke with patient, instructed her to stop Eliquis 3 days prior to procedure. Also informed her that Eliquis is not to be discontinued within 3 weeks of Tikosyn. ?

## 2021-07-26 ENCOUNTER — Telehealth: Payer: Self-pay

## 2021-07-26 NOTE — Progress Notes (Unsigned)
PROVIDER NOTE: Interpretation of information contained herein should be left to medically-trained personnel. Specific patient instructions are provided elsewhere under "Patient Instructions" section of medical record. This document was created in part using STT-dictation technology, any transcriptional errors that may result from this process are unintentional.  Patient: Lauren Lloyd Type: Established DOB: 1941/02/23 MRN: [id number] PCP: [name], MD  Service: Procedure DOS: 07/27/2021 Setting: Ambulatory Location: Ambulatory outpatient facility Delivery: Face-to-face Provider: Gaspar Cola, MD Specialty: Interventional Pain Management Specialty designation: 09 Location: Outpatient facility Ref. Prov.: Bacigalupo, Dionne Bucy, MD    Primary Reason for Visit: Interventional Pain Management Treatment. CC: No chief complaint on file.   Procedure:           Type: Trans-Foraminal Epidural Steroid Injection (Lumbar)  ***   Laterality: Bilateral  Level: L1 & L2  Imaging: Fluoroscopic guidance Anesthesia: Local anesthesia (1-2% Lidocaine) Anxiolysis: None                 Sedation: None. DOS: 07/27/2021  Performed by: Gaspar Cola, MD  Purpose: Diagnostic/Therapeutic Indications: Lumbar radicular pain severe enough to impact quality of life or function. No diagnosis found. NAS-11 Pain score:   Pre-procedure:  /10   Post-procedure:  /10     Position / Prep / Materials:  Position: Prone  Prep solution: DuraPrep (Iodine Povacrylex [0.7% available iodine] and Isopropyl Alcohol, 74% w/w) Prep Area: Entire Posterior Lumbosacral Area.  From the lower tip of the scapula down to the tailbone and from flank to flank. Materials:  Tray: Block Needle(s):  Type: Spinal  Gauge (G): 22  Length: 5-in  Qty:  ***   Pre-op H&P Assessment:  Lauren Lloyd is a 81 y.o. (year old), female patient, seen today for interventional treatment. She  has a past surgical history that  includes Hammer toe surgery (05/16/2008); Skin graft (Left, 04/08/2013); Replacement total knee (Right, 05/17/2007); Knee arthroscopy (Right, 05/16/2002); Tonsillectomy (05/16/1944); Cataract extraction w/PHACO (Right, 01/25/2016); Cataract extraction w/PHACO (Left, 02/22/2016); CARDIOVERSION (N/A, 06/15/2018); Total knee arthroplasty (Left, 12/02/2020); and Cardioversion (N/A, 02/18/2021). Lauren Lloyd has a current medication list which includes the following prescription(s): acetaminophen, bisoprolol, calcium carbonate, cholecalciferol, eliquis, furosemide, loperamide, metaxalone, multivitamin, and potassium chloride. Her primarily concern today is the No chief complaint on file.  Initial Vital Signs:  Pulse/HCG Rate:    Temp:   Resp:   BP:   SpO2:    BMI: Estimated body mass index is 33.83 kg/m as calculated from the following:   Height as of 07/19/21: '5\' 7"'$  (1.702 m).   Weight as of 07/19/21: 216 lb (98 kg).  Risk Assessment: Allergies: Reviewed. She is allergic to levofloxacin, influenza vaccines, and oysters [shellfish allergy].  Allergy Precautions: None required Coagulopathies: Reviewed. None identified.  Blood-thinner therapy: None at this time Active Infection(s): Reviewed. None identified. Lauren Lloyd is afebrile  Site Confirmation: Lauren Lloyd was asked to confirm the procedure and laterality before marking the site Procedure checklist: Completed Consent: Before the procedure and under the influence of no sedative(s), amnesic(s), or anxiolytics, the patient was informed of the treatment options, risks and possible complications. To fulfill our ethical and legal obligations, as recommended by the American Medical Association's Code of Ethics, I have informed the patient of my clinical impression; the nature and purpose of the treatment or procedure; the risks, benefits, and possible complications of the intervention; the alternatives, including doing nothing; the risk(s) and benefit(s) of the  alternative treatment(s) or procedure(s); and the risk(s) and benefit(s) of doing nothing.  The patient was provided information about the general risks and possible complications associated with the procedure. These may include, but are not limited to: failure to achieve desired goals, infection, bleeding, organ or nerve damage, allergic reactions, paralysis, and death. In addition, the patient was informed of those risks and complications associated to Spine-related procedures, such as failure to decrease pain; infection (i.e.: Meningitis, epidural or intraspinal abscess); bleeding (i.e.: epidural hematoma, subarachnoid hemorrhage, or any other type of intraspinal or peri-dural bleeding); organ or nerve damage (i.e.: Any type of peripheral nerve, nerve root, or spinal cord injury) with subsequent damage to sensory, motor, and/or autonomic systems, resulting in permanent pain, numbness, and/or weakness of one or several areas of the body; allergic reactions; (i.e.: anaphylactic reaction); and/or death. Furthermore, the patient was informed of those risks and complications associated with the medications. These include, but are not limited to: allergic reactions (i.e.: anaphylactic or anaphylactoid reaction(s)); adrenal axis suppression; blood sugar elevation that in diabetics may result in ketoacidosis or comma; water retention that in patients with history of congestive heart failure may result in shortness of breath, pulmonary edema, and decompensation with resultant heart failure; weight gain; swelling or edema; medication-induced neural toxicity; particulate matter embolism and blood vessel occlusion with resultant organ, and/or nervous system infarction; and/or aseptic necrosis of one or more joints. Finally, the patient was informed that Medicine is not an exact science; therefore, there is also the possibility of unforeseen or unpredictable risks and/or possible complications that may result in a  catastrophic outcome. The patient indicated having understood very clearly. We have given the patient no guarantees and we have made no promises. Enough time was given to the patient to ask questions, all of which were answered to the patient's satisfaction. Ms. Fayette has indicated that she wanted to continue with the procedure. Attestation: I, the ordering provider, attest that I have discussed with the patient the benefits, risks, side-effects, alternatives, likelihood of achieving goals, and potential problems during recovery for the procedure that I have provided informed consent. Date   Time: {CHL [facility]-PAIN TIME CHOICES:21018001}  Pre-Procedure Preparation:  Monitoring: As per clinic protocol. Respiration, ETCO2, SpO2, BP, heart rate and rhythm monitor placed and checked for adequate function Safety Precautions: Patient was assessed for positional comfort and pressure points before starting the procedure. Time-out: I initiated and conducted the "Time-out" before starting the procedure, as per protocol. The patient was asked to participate by confirming the accuracy of the "Time Out" information. Verification of the correct person, site, and procedure were performed and confirmed by me, the nursing staff, and the patient. "Time-out" conducted as per Joint Commission's Universal Protocol (UP.01.01.01). Time:    Description/Narrative of Procedure:          Target: The 6 o'clock position under the pedicle, on the affected side. Region: Posterolateral Lumbosacral Approach: Posterior Percutaneous Paravertebral approach.  Rationale (medical necessity): procedure needed and proper for the diagnosis and/or treatment of the patient's medical symptoms and needs. Procedural Technique Safety Precautions: Aspiration looking for blood return was conducted prior to all injections. At no point did we inject any substances, as a needle was being advanced. No attempts were made at seeking any paresthesias. Safe  injection practices and needle disposal techniques used. Medications properly checked for expiration dates. SDV (single dose vial) medications used. Description of the Procedure: Protocol guidelines were followed. The patient was placed in position over the procedure table. The target area was identified and the area prepped in the usual manner. Skin &  deeper tissues infiltrated with local anesthetic. Appropriate amount of time allowed to pass for local anesthetics to take effect. The procedure needles were then advanced to the target area. Proper needle placement secured. Negative aspiration confirmed. Solution injected in intermittent fashion, asking for systemic symptoms every 0.5cc of injectate. The needles were then removed and the area cleansed, making sure to leave some of the prepping solution back to take advantage of its long term bactericidal properties.  There were no vitals filed for this visit.  Start Time:   hrs. End Time:   hrs.  Imaging Guidance (Spinal):          Type of Imaging Technique: Fluoroscopy Guidance (Spinal) Indication(s): Assistance in needle guidance and placement for procedures requiring needle placement in or near specific anatomical locations not easily accessible without such assistance. Exposure Time: Please see nurses notes. Contrast: Before injecting any contrast, we confirmed that the patient did not have an allergy to iodine, shellfish, or radiological contrast. Once satisfactory needle placement was completed at the desired level, radiological contrast was injected. Contrast injected under live fluoroscopy. No contrast complications. See chart for type and volume of contrast used. Fluoroscopic Guidance: I was personally present during the use of fluoroscopy. "Tunnel Vision Technique" used to obtain the best possible view of the target area. Parallax error corrected before commencing the procedure. "Direction-depth-direction" technique used to introduce the needle  under continuous pulsed fluoroscopy. Once target was reached, antero-posterior, oblique, and lateral fluoroscopic projection used confirm needle placement in all planes. Images permanently stored in EMR. Interpretation: I personally interpreted the imaging intraoperatively. Adequate needle placement confirmed in multiple planes. Appropriate spread of contrast into desired area was observed. No evidence of afferent or efferent intravascular uptake. No intrathecal or subarachnoid spread observed. Permanent images saved into the patient's record.  Antibiotic Prophylaxis:   Anti-infectives (From admission, onward)    None      Indication(s): None identified  Post-operative Assessment:  Post-procedure Vital Signs:  Pulse/HCG Rate:    Temp:   Resp:   BP:   SpO2:    EBL: None  Complications: No immediate post-treatment complications observed by team, or reported by patient.  Note: The patient tolerated the entire procedure well. A repeat set of vitals were taken after the procedure and the patient was kept under observation following institutional policy, for this type of procedure. Post-procedural neurological assessment was performed, showing return to baseline, prior to discharge. The patient was provided with post-procedure discharge instructions, including a section on how to identify potential problems. Should any problems arise concerning this procedure, the patient was given instructions to immediately contact us, at any time, without hesitation. In any case, we plan to contact the patient by telephone for a follow-up status report regarding this interventional procedure.  Comments:  No additional relevant information.  Plan of Care  Orders:  No orders of the defined types were placed in this encounter.  Chronic Opioid Analgesic:  None MME/day: 0 mg/day   Medications ordered for procedure: No orders of the defined types were placed in this encounter.  Medications  administered: Tisa C. Ruelas had no medications administered during this visit.  See the medical record for exact dosing, route, and time of administration.  Follow-up plan:   No follow-ups on file.       Interventional Therapies  Risk   Complexity Considerations:   Estimated body mass index is 33.52 kg/m as calculated from the following:   Height as of this encounter: '5\' 7"'$  (1.702  m).   Weight as of this encounter: 214 lb (97.1 kg). Eliquis ANTICOAGULATION: (Stop: 3 days   Restart: 6 hours)   Planned   Pending:   Diagnostic bilateral L1 and L2 TFESI #1    Under consideration:   Diagnostic bilateral T12-L1 and L1-2 TFESI #1  Diagnostic bilateral lumbar facet MBB (T12-L5) #1    Completed:   None at this time   Completed by Sharlet Salina, DO:   01/21/2021: Bilateral S1 transforaminal ESI (no benefit)  09/30/2020: RFA to the bilateral L4-5 and L5-S1 facet joints (no benefit)  07/07/2020: Bilateral MBB to the L4-5 and L5-S1 facet joints (8/10 to 0/10) (20% benefit according to patient)  06/23/2020: Bilateral MBB to the L5-5 and L5-S1 facet joints (8/10 to 0/10) (20% benefit according to patient)  03/16/2020: Bilateral S1 transforaminal ESI (good relief)  01/17/2020: Bilateral S1 transforaminal ESI (good relief until extensive walking in [place])  12/17/2019: Bilateral S1 transforaminal ESI (good relief x3 weeks)    Therapeutic   Palliative (PRN) options:   None established     Recent Visits Date Type Provider Dept  07/19/21 Office Visit Milinda Pointer, MD [facility]  06/16/21 Office Visit Milinda Pointer, MD [facility]  Showing recent visits within past 90 days and meeting all other requirements Future Appointments Date Type Provider Dept  07/27/21 Appointment Milinda Pointer, MD [facility]  Showing future appointments within next 90 days and meeting all other requirements  Disposition: Discharge home  Discharge (Date   Time):  07/27/2021;   hrs.   Primary Care Physician: [name], MD Location: [facility] Note by: Gaspar Cola, MD Date: 07/27/2021; Time: 3:48 PM  Disclaimer:  Medicine is not an Chief Strategy Officer. The only guarantee in medicine is that nothing is guaranteed. It is important to note that the decision to proceed with this intervention was based on the information collected from the patient. The Data and conclusions were drawn from the patient's questionnaire, the interview, and the physical examination. Because the information was provided in large part by the patient, it cannot be guaranteed that it has not been purposely or unconsciously manipulated. Every effort has been made to obtain as much relevant data as possible for this evaluation. It is important to note that the conclusions that lead to this procedure are derived in large part from the available data. Always take into account that the treatment will also be dependent on availability of resources and existing treatment guidelines, considered by other Pain Management Practitioners as being common knowledge and practice, at the time of the intervention. For Medico-Legal purposes, it is also important to point out that variation in procedural techniques and pharmacological choices are the acceptable norm. The indications, contraindications, technique, and results of the above procedure should only be interpreted and judged by a Board-Certified Interventional Pain Specialist with extensive familiarity and expertise in the same exact procedure and technique.

## 2021-07-26 NOTE — Progress Notes (Signed)
Chronic Care Management APPOINTMENT REMINDER ? ? ?Called [facility] MCNABB, No answer, left message of appointment on 07/27/2021 at 3:45 pm via telephone visit with Junius Argyle , Pharm D. Notified to have all medications, supplements, blood pressure and/or blood sugar logs available during appointment and to return call if need to reschedule. ? ?Anderson Malta ?Clinical Pharmacist Assistant ?[phone]  ? ?

## 2021-07-27 ENCOUNTER — Other Ambulatory Visit: Payer: Self-pay

## 2021-07-27 ENCOUNTER — Ambulatory Visit (INDEPENDENT_AMBULATORY_CARE_PROVIDER_SITE_OTHER): Payer: Medicare HMO

## 2021-07-27 ENCOUNTER — Ambulatory Visit
Admission: RE | Admit: 2021-07-27 | Discharge: 2021-07-27 | Disposition: A | Discharge status: Home / Self Care | Payer: Medicare HMO | Source: Ambulatory Visit | Attending: Pain Medicine | Admitting: Pain Medicine

## 2021-07-27 ENCOUNTER — Ambulatory Visit (HOSPITAL_BASED_OUTPATIENT_CLINIC_OR_DEPARTMENT_OTHER): Payer: Medicare HMO | Admitting: Pain Medicine

## 2021-07-27 ENCOUNTER — Encounter: Payer: Self-pay | Admitting: Pain Medicine

## 2021-07-27 VITALS — BP 120/86 | HR 93 | Temp 97.0°F | Resp 19 | Ht 67.0 in | Wt 216.0 lb

## 2021-07-27 DIAGNOSIS — M4310 Spondylolisthesis, site unspecified: Secondary | ICD-10-CM | POA: Diagnosis present

## 2021-07-27 DIAGNOSIS — M4807 Spinal stenosis, lumbosacral region: Secondary | ICD-10-CM | POA: Diagnosis present

## 2021-07-27 DIAGNOSIS — G8929 Other chronic pain: Secondary | ICD-10-CM | POA: Insufficient documentation

## 2021-07-27 DIAGNOSIS — M545 Low back pain, unspecified: Secondary | ICD-10-CM | POA: Diagnosis present

## 2021-07-27 DIAGNOSIS — Z7901 Long term (current) use of anticoagulants: Secondary | ICD-10-CM | POA: Insufficient documentation

## 2021-07-27 DIAGNOSIS — I5032 Chronic diastolic (congestive) heart failure: Secondary | ICD-10-CM

## 2021-07-27 DIAGNOSIS — M48061 Spinal stenosis, lumbar region without neurogenic claudication: Secondary | ICD-10-CM | POA: Insufficient documentation

## 2021-07-27 DIAGNOSIS — R937 Abnormal findings on diagnostic imaging of other parts of musculoskeletal system: Secondary | ICD-10-CM | POA: Insufficient documentation

## 2021-07-27 DIAGNOSIS — M5136 Other intervertebral disc degeneration, lumbar region: Secondary | ICD-10-CM | POA: Diagnosis present

## 2021-07-27 DIAGNOSIS — I4819 Other persistent atrial fibrillation: Secondary | ICD-10-CM

## 2021-07-27 MED ORDER — SODIUM CHLORIDE 0.9% FLUSH
2.0000 mL | Freq: Once | INTRAVENOUS | Status: AC
  Administered 2021-07-27: 2 mL

## 2021-07-27 MED ORDER — LACTATED RINGERS IV SOLN: 1000.0000 mL | Freq: Once | INTRAVENOUS | Status: DC

## 2021-07-27 MED ORDER — SODIUM CHLORIDE (PF) 0.9 % IJ SOLN
INTRAMUSCULAR | Status: AC
  Filled 2021-07-27: qty 10

## 2021-07-27 MED ORDER — DEXAMETHASONE SODIUM PHOSPHATE 10 MG/ML IJ SOLN
INTRAMUSCULAR | Status: AC
  Filled 2021-07-27: qty 2

## 2021-07-27 MED ORDER — PENTAFLUOROPROP-TETRAFLUOROETH EX AERO
INHALATION_SPRAY | Freq: Once | CUTANEOUS | Status: DC
  Filled 2021-07-27: qty 116

## 2021-07-27 MED ORDER — IOHEXOL 180 MG/ML  SOLN
10.0000 mL | Freq: Once | INTRAMUSCULAR | Status: AC
  Administered 2021-07-27: 5 mL via INTRATHECAL

## 2021-07-27 MED ORDER — LIDOCAINE HCL 2 % IJ SOLN
20.0000 mL | Freq: Once | INTRAMUSCULAR | Status: AC
  Administered 2021-07-27: 200 mg

## 2021-07-27 MED ORDER — MIDAZOLAM HCL 5 MG/5ML IJ SOLN: 0.5000 mg | Freq: Once | INTRAMUSCULAR | Status: DC

## 2021-07-27 MED ORDER — ROPIVACAINE HCL 2 MG/ML IJ SOLN
INTRAMUSCULAR | Status: AC
  Filled 2021-07-27: qty 20

## 2021-07-27 MED ORDER — LIDOCAINE HCL (PF) 2 % IJ SOLN
INTRAMUSCULAR | Status: AC
  Filled 2021-07-27: qty 10

## 2021-07-27 MED ORDER — ROPIVACAINE HCL 2 MG/ML IJ SOLN
2.0000 mL | Freq: Once | INTRAMUSCULAR | Status: AC
  Administered 2021-07-27: 2 mL via EPIDURAL

## 2021-07-27 MED ORDER — DEXAMETHASONE SODIUM PHOSPHATE 10 MG/ML IJ SOLN
20.0000 mg | Freq: Once | INTRAMUSCULAR | Status: AC
  Administered 2021-07-27: 20 mg

## 2021-07-27 NOTE — Telephone Encounter (Signed)
Patient returning call to discuss  ?

## 2021-07-27 NOTE — Patient Instructions (Signed)

## 2021-07-27 NOTE — Progress Notes (Signed)
Safety precautions to be maintained throughout the outpatient stay will include: orient to surroundings, keep bed in low position, maintain call bell within reach at all times, provide assistance with transfer out of bed and ambulation.  

## 2021-07-27 NOTE — Progress Notes (Signed)
? ?Chronic Care Management ?Pharmacy Note ? ?08/13/2021 ?Name:  Lauren Lloyd MRN:  [id number] DOB:  22-Aug-1940 ? ?Summary: ?Patient presents for CCM follow-up ? ?Recommendations/Changes made from today's visit: ?Continue current medications ? ?Plan: ?CPP follow-up 6 months ? ?Subjective: ?Lauren Lloyd is an 81 y.o. year old female who is a primary patient of Bacigalupo, Dionne Bucy, MD.  The CCM team was consulted for assistance with disease management and care coordination needs.   ? ?Engaged with patient by telephone for follow up visit in response to provider referral for pharmacy case management and/or care coordination services.  ? ?Consent to Services:  ?The patient was given information about Chronic Care Management services, agreed to services, and gave verbal consent prior to initiation of services.  Please see initial visit note for detailed documentation.  ? ?Patient Care Team: ?[name], MD as PCP - General (Family Medicine) ?Minna Merritts, MD as PCP - Cardiology (Cardiology) ?Vickie Epley, MD as PCP - Electrophysiology (Cardiology) ?Sharlet Salina, MD as Referring Physician (Physical Medicine and Rehabilitation) ?Pa, Vernon ?Ottie Glazier, MD as Consulting Physician (Pulmonary Disease) ?Dasher, Rayvon Char, MD (Dermatology) ?[name], DO as Referring Physician (Family Medicine) ?Germaine Pomfret, [facility] as Pharmacist (Pharmacist) ? ?Recent office visits: ?02/08/21: Patient presented to Dr. Brita Romp for follow-up.  ? ?Recent consult visits: ?07/07/21: Patient presented to Dr. Quentin Ore for follow-up.  ?06/16/2021 Dr. Dossie Arbour MD (Pain medicine) No medication Changes noted ? ?Hospital visits: ?None in previous 6 months ? ? ?Objective: ? ?Lab Results  ?Component Value Date  ? CREATININE 0.96 02/12/2021  ? BUN 20 02/12/2021  ? EGFR 59 (L) 02/12/2021  ? GFRNONAA 65 12/08/2020  ? GFRAA 75 12/09/2019  ? NA 144 02/12/2021  ? K 4.4 02/12/2021  ? CALCIUM 9.7 02/12/2021  ?  CO2 23 02/12/2021  ? GLUCOSE 107 (H) 02/12/2021  ? ? ?Lab Results  ?Component Value Date/Time  ? HGBA1C 5.5 12/09/2019 02:11 PM  ?  ?Last diabetic Eye exam: No results found for: HMDIABEYEEXA  ?Last diabetic Foot exam: No results found for: HMDIABFOOTEX  ? ?Lab Results  ?Component Value Date  ? CHOL 289 (H) 08/03/2020  ? HDL 65 08/03/2020  ? LDLCALC 188 (H) 08/03/2020  ? TRIG 193 (H) 08/03/2020  ? CHOLHDL 4.4 08/03/2020  ? ? ? ?  Latest Ref Rng & Units 09/24/2020  ?  9:49 AM 08/03/2020  ? 10:12 AM 12/09/2019  ?  2:11 PM  ?Hepatic Function  ?Total Protein 6.5 - 8.1 g/dL 6.9   6.8   6.8    ?Albumin 3.5 - 5.0 g/dL 3.6   4.3   4.3    ?AST 15 - 41 U/L _0 ?ALT 0 - 44 U/L _1 ?Alk Phosphatase 38 - 126 U/L 73   108   106    ?Total Bilirubin 0.3 - 1.2 mg/dL 0.9   0.4   0.4    ? ? ?Lab Results  ?Component Value Date/Time  ? TSH 1.020 08/03/2020 10:12 AM  ? TSH 0.500 12/09/2019 02:11 PM  ? FREET4 1.74 12/09/2019 02:11 PM  ? ? ? ?  Latest Ref Rng & Units 02/12/2021  ?  9:37 AM 12/08/2020  ? 12:00 AM 09/24/2020  ?  9:49 AM  ?CBC  ?WBC 3.4 - 10.8 x10E3/uL 6.9   8.1      14.4    ?  Hemoglobin 11.1 - 15.9 g/dL 14.1   10.6      12.8    ?Hematocrit 34.0 - 46.6 % 42.4   33      37.4    ?Platelets 150 - 450 x10E3/uL 286   289      278    ?  ? This result is from an external source.  ? ? ?Lab Results  ?Component Value Date/Time  ? VD25OH 27.3 (L) 08/03/2020 10:12 AM  ? VD25OH 24.4 (L) 12/09/2019 02:11 PM  ? ? ?Clinical ASCVD: No  ?The ASCVD Risk score (Arnett DK, et al., 2019) failed to calculate for the following reasons: ?  The 2019 ASCVD risk score is only valid for ages 69 to 45   ? ? ?  07/19/2021  ?  1:14 PM 06/16/2021  ? 11:23 AM 02/08/2021  ?  9:25 AM  ?Depression screen PHQ 2/9  ?Decreased Interest 0 0 0  ?Down, Depressed, Hopeless 0 0 0  ?PHQ - 2 Score 0 0 0  ?Altered sleeping   1  ?Tired, decreased energy   1  ?Change in appetite   1  ?Feeling bad or failure about yourself    0  ?Trouble concentrating   0  ?Moving  slowly or fidgety/restless   0  ?Suicidal thoughts   0  ?PHQ-9 Score   3  ?Difficult doing work/chores   Somewhat difficult  ?  ?Social History  ? ?Tobacco Use  ?Smoking Status Former  ? Packs/day: 1.00  ? Years: 30.00  ? Pack years: 30.00  ? Types: Cigarettes  ? Quit date: 05/16/1989  ? Years since quitting: 32.2  ?Smokeless Tobacco Never  ? ?BP Readings from Last 3 Encounters:  ?08/10/21 127/90  ?07/27/21 120/86  ?07/19/21 135/88  ? ?Pulse Readings from Last 3 Encounters:  ?08/10/21 64  ?07/27/21 93  ?07/07/21 89  ? ?Wt Readings from Last 3 Encounters:  ?08/10/21 216 lb (98 kg)  ?07/27/21 216 lb (98 kg)  ?07/19/21 216 lb (98 kg)  ? ?BMI Readings from Last 3 Encounters:  ?08/10/21 33.83 kg/m?  ?07/27/21 33.83 kg/m?  ?07/19/21 33.83 kg/m?  ? ? ?Assessment/Interventions: Review of patient past medical history, allergies, medications, health status, including review of consultants reports, laboratory and other test data, was performed as part of comprehensive evaluation and provision of chronic care management services.  ? ?SDOH:  (Social Determinants of Health) assessments and interventions performed: Yes ?SDOH Interventions   ? ?Flowsheet Row Most Recent Value  ?SDOH Interventions   ?Financial Strain Interventions Intervention Not Indicated  ? ?  ? ?SDOH Screenings  ? ?Alcohol Screen: Not on file  ?Depression (PHQ2-9): Low Risk   ? PHQ-2 Score: 0  ?Financial Resource Strain: Low Risk   ? Difficulty of Paying Living Expenses: Not hard at all  ?Food Insecurity: Not on file  ?Housing: Not on file  ?Physical Activity: Not on file  ?Social Connections: Not on file  ?Stress: Not on file  ?Tobacco Use: Medium Risk  ? Smoking Tobacco Use: Former  ? Smokeless Tobacco Use: Never  ? Passive Exposure: Not on file  ?Transportation Needs: Not on file  ? ? ?[place] ? ?Allergies  ?Allergen Reactions  ? Levofloxacin   ?  Other reaction(s): Joint Pains ?  ? Influenza Vaccines Other (See Comments)  ?  Bell's Palsy  ? Oysters  [Shellfish Allergy] Swelling  ?  She states she had eaten them three days in a row and she developed swelling  around her eyes.   ? ? ?Medications Reviewed Today   ? ? Reviewed by Milinda Pointer, MD (Physician) on 08/10/21 at 1426  Med List Status: <None>  ? ?Medication Order Taking? Sig Documenting Provider Last Dose Status Informant  ?acetaminophen (TYLENOL) 325 MG tablet [id number] Yes Take 650 mg by mouth every 6 (six) hours as needed. [provider] Taking Active   ?bisoprolol (ZEBETA) 5 MG tablet [id number] Yes Take 1.5 tablets (7.5 mg total) by mouth in the morning and at bedtime. Minna Merritts, MD Taking Active   ?calcium carbonate (TUMS EX) 750 MG chewable tablet [id number] Yes Chew 2 tablets by mouth daily as needed for heartburn. [provider] Taking Active Self  ?Cholecalciferol 25 MCG (1000 UT) tablet [id number] Yes Take 1,000 Units by mouth daily. [provider] Taking Active Self  ?ELIQUIS 5 MG TABS tablet [id number] Yes TAKE ONE TABLET BY MOUTH TWICE DAILY Gollan, Kathlene November, MD Taking Active   ?furosemide (LASIX) 40 MG tablet [id number] Yes Take 1 tablet (40 mg total) by mouth daily. Take extra 40 mg as needed after lunch for abdominal swelling, leg swelling, or shortness of breath Gollan, Kathlene November, MD Taking Active Self  ?loperamide (IMODIUM) 2 MG capsule [id number] Yes Take 2 mg by mouth as needed for diarrhea or loose stools. [provider] Taking Active Self  ?metaxalone (SKELAXIN) 800 MG tablet [id number] Yes Take 800 mg by mouth daily as needed for muscle spasms. [provider] Taking Active Self  ?         ?Med Note Gentry Roch   Fri Feb 12, 2021 10:51 AM)    ?Multiple Vitamin (MULTIVITAMIN) capsule [id number] Yes Take 1 capsule by mouth daily. [provider] Taking Active Self  ?potassium chloride (KLOR-CON) 10 MEQ tablet [id number] Yes TAKE 1 TABLET BY MOUTH DAILY Gollan, Kathlene November, MD Taking Active   ?Med List Note Landis Martins, RN 07/28/21 1339): 06-16-21 UDS ?07/19/2021  Medical clearance sent to Dr Rockey Situ to stop Eliquis 3 days  ? ?  ?  ? ?  ? ? ?Patient Active Problem List  ? Diagnosis Date Noted  ? DDD (degenerative dis

## 2021-07-28 ENCOUNTER — Telehealth: Payer: Self-pay | Admitting: *Deleted

## 2021-07-28 NOTE — Telephone Encounter (Signed)
No problems post procedure. 

## 2021-07-30 NOTE — Telephone Encounter (Signed)
Left message

## 2021-08-02 DIAGNOSIS — Z96651 Presence of right artificial knee joint: Secondary | ICD-10-CM | POA: Diagnosis not present

## 2021-08-02 DIAGNOSIS — M1612 Unilateral primary osteoarthritis, left hip: Secondary | ICD-10-CM | POA: Insufficient documentation

## 2021-08-02 DIAGNOSIS — Z96653 Presence of artificial knee joint, bilateral: Secondary | ICD-10-CM | POA: Diagnosis not present

## 2021-08-02 DIAGNOSIS — Z471 Aftercare following joint replacement surgery: Secondary | ICD-10-CM | POA: Diagnosis not present

## 2021-08-02 DIAGNOSIS — Z96652 Presence of left artificial knee joint: Secondary | ICD-10-CM | POA: Diagnosis not present

## 2021-08-04 ENCOUNTER — Telehealth: Payer: Self-pay | Admitting: Pain Medicine

## 2021-08-04 NOTE — Telephone Encounter (Signed)
Had x-ray of left hip done on 08-02-21 at [facility], pt requesting that Dr. Dossie Arbour look at the results and discuss treatment options. I advised her to discuss with Dr. Dossie Arbour at appt next week. ?

## 2021-08-06 ENCOUNTER — Encounter (HOSPITAL_COMMUNITY): Payer: Self-pay | Admitting: *Deleted

## 2021-08-06 NOTE — Telephone Encounter (Signed)
Letter sent for contact. Unable to reach by phone/voicemails left. ?

## 2021-08-10 ENCOUNTER — Encounter: Payer: Self-pay | Admitting: Pain Medicine

## 2021-08-10 ENCOUNTER — Other Ambulatory Visit: Payer: Self-pay

## 2021-08-10 ENCOUNTER — Ambulatory Visit: Payer: Medicare HMO | Attending: Pain Medicine | Admitting: Pain Medicine

## 2021-08-10 VITALS — BP 127/90 | HR 64 | Temp 97.3°F | Resp 16 | Ht 67.0 in | Wt 216.0 lb

## 2021-08-10 DIAGNOSIS — M545 Low back pain, unspecified: Secondary | ICD-10-CM | POA: Insufficient documentation

## 2021-08-10 DIAGNOSIS — M4310 Spondylolisthesis, site unspecified: Secondary | ICD-10-CM | POA: Insufficient documentation

## 2021-08-10 DIAGNOSIS — M48061 Spinal stenosis, lumbar region without neurogenic claudication: Secondary | ICD-10-CM | POA: Diagnosis not present

## 2021-08-10 DIAGNOSIS — G8929 Other chronic pain: Secondary | ICD-10-CM | POA: Diagnosis not present

## 2021-08-10 MED ORDER — PREDNISONE 20 MG PO TABS: ORAL_TABLET | ORAL | 0 refills | Status: AC

## 2021-08-10 NOTE — Patient Instructions (Signed)
______________________________________________________________________  Preparing for Procedure with Sedation  NOTICE: Due to recent regulatory changes, starting on December 14, 2020, procedures requiring intravenous (IV) sedation will no longer be performed at the [facility].  These types of procedures are required to be performed at [facility] ambulatory surgery facility.  We are very sorry for the inconvenience.  Procedure appointments are limited to planned procedures: No Prescription Refills. No disability issues will be discussed. No medication changes will be discussed.  Instructions: Oral Intake: Do not eat or drink anything for at least 8 hours prior to your procedure. (Exception: Blood Pressure Medication. See below.) Transportation: A driver is required. You may not drive yourself after the procedure. Blood Pressure Medicine: Do not forget to take your blood pressure medicine with a sip of water the morning of the procedure. If your Diastolic (lower reading) is above 100 mmHg, elective cases will be cancelled/rescheduled. Blood thinners: These will need to be stopped for procedures. Notify our staff if you are taking any blood thinners. Depending on which one you take, there will be specific instructions on how and when to stop it. Diabetics on insulin: Notify the staff so that you can be scheduled 1st case in the morning. If your diabetes requires high dose insulin, take only  of your normal insulin dose the morning of the procedure and notify the staff that you have done so. Preventing infections: Shower with an antibacterial soap the morning of your procedure. Build-up your immune system: Take 1000 mg of Vitamin C with every meal (3 times a day) the day prior to your procedure. Antibiotics: Inform the staff if you have a condition or reason that requires you to take antibiotics before dental procedures. Pregnancy: If you are pregnant, call and cancel the procedure. Sickness: If  you have a cold, fever, or any active infections, call and cancel the procedure. Arrival: You must be in the facility at least 30 minutes prior to your scheduled procedure. Children: Do not bring children with you. Dress appropriately: Bring dark clothing that you would not mind if they get stained. Valuables: Do not bring any jewelry or valuables.  Reasons to call and reschedule or cancel your procedure: (Following these recommendations will minimize the risk of a serious complication.) Surgeries: Avoid having procedures within 2 weeks of any surgery. (Avoid for 2 weeks before or after any surgery). Flu Shots: Avoid having procedures within 2 weeks of a flu shots. (Avoid for 2 weeks before or after immunizations). Barium: Avoid having a procedure within 7-10 days after having had a radiological study involving the use of radiological contrast. (Myelograms, Barium swallow or enema study). Heart attacks: Avoid any elective procedures or surgeries for the initial 6 months after a "Myocardial Infarction" (Heart Attack). Blood thinners: It is imperative that you stop these medications before procedures. Let us know if you if you take any blood thinner.  Infection: Avoid procedures during or within two weeks of an infection (including chest colds or gastrointestinal problems). Symptoms associated with infections include: Localized redness, fever, chills, night sweats or profuse sweating, burning sensation when voiding, cough, congestion, stuffiness, runny nose, sore throat, diarrhea, nausea, vomiting, cold or Flu symptoms, recent or current infections. It is specially important if the infection is over the area that we intend to treat. Heart and lung problems: Symptoms that may suggest an active cardiopulmonary problem include: cough, chest pain, breathing difficulties or shortness of breath, dizziness, ankle swelling, uncontrolled high or unusually low blood pressure, and/or palpitations. If you are    experiencing any of these symptoms, cancel your procedure and contact your primary care physician for an evaluation.  Remember:  Regular Business hours are:  Monday to Thursday 8:00 AM to 4:00 PM  Provider's Schedule: Francisco Naveira, MD:  Procedure days: Tuesday and Thursday 7:30 AM to 4:00 PM  Bilal Lateef, MD:  Procedure days: Monday and Wednesday 7:30 AM to 4:00 PM ______________________________________________________________________   

## 2021-08-10 NOTE — Progress Notes (Signed)
PROVIDER NOTE: Information contained herein reflects review and annotations entered in association with encounter. Interpretation of such information and data should be left to medically-trained personnel. Information provided to patient can be located elsewhere in the medical record under "Patient Instructions". Document created using STT-dictation technology, any transcriptional errors that may result from process are unintentional.  ?  ?Patient: Lauren Lloyd  Service Category: E/M  Provider: Gaspar Cola, MD  ?DOB: February 16, 1941  DOS: 08/10/2021  Specialty: Interventional Pain Management  ?MRN: [id number]  Setting: Ambulatory outpatient  PCP: [name], MD  ?Type: Established Patient    Referring Provider: [name], MD  ?Location: Office  Delivery: Face-to-face    ? ?HPI  ?[name], a 81 y.o. year old female, is here today because of her Foraminal stenosis of lumbar region [M48.061]. [name] primary complain today is Back Pain (lower) ?Last encounter: My last encounter with her was on 08/04/2021. ?Pertinent problems: [name] has Cervical pain; L-S radiculopathy; Primary osteoarthritis of one hip; S/P TKR (total knee replacement), left; Chronic right shoulder pain; Traumatic amputation of finger; Leg cramps; Metatarsalgia of both feet; Paresthesias; Degenerative cervical spinal stenosis; Thoracic spondylosis; Primary osteoarthritis of left knee; Chronic pain syndrome; Abnormal MRI, lumbar spine (02/24/2021); Chronic low back pain (1ry area of Pain) (Bilateral) (R>L) w/o sciatica; Chronic sacroiliac joint pain (3ry area of Pain) (Right); Lumbar facet syndrome (Bilateral); Grade 1 Anterolisthesis of lumbar spine of L4/L5; Retrolisthesis of L1 over L2 and L2 over L3; Lumbar central spinal stenosis, w/o neurogenic claudication (L3-4, L4-5); Lumbosacral foraminal stenosis (Multilevel) (Bilateral); Lumbar lateral recess stenosis (Bilateral: L4-5); Lumbar facet arthropathy  (Multilevel) (Bilateral); Lumbar foraminal stenosis (Multilevel) (Bilateral) (R>L: T12-L1, L1-2, L5-S1) (L>R: L2-3, L3-4,); and DDD (degenerative disc disease), lumbar on their pertinent problem list. ?Pain Assessment: Severity of Chronic pain is reported as a 2 /10. Location: Back Mid/denies, not sure if hips. Onset: More than a month ago. Quality: Constant, Discomfort. Timing: Constant. Modifying factor(s): rest, hot showers. ?Vitals:  height is '5\' 7"'$  (1.702 m) and weight is 216 lb (98 kg). Her temperature is 97.3 ?F (36.3 ?C) (abnormal). Her blood pressure is 127/90 and her pulse is 64. Her respiration is 16 and oxygen saturation is 96%.  ? ?Reason for encounter: post-procedure evaluation and assessment.  The patient indicates having attained 100% relief of the pain for the duration of the local anesthetic followed by a decrease to a 50% ongoing relief of her low back and thigh pain.  However, she has been experiencing a lot of discomfort in the left hip area and she refers that while following up with her orthopedic surgeon for her knee replacement, they evaluated her left hip and was told that she was "bone-on-bone".  The patient is extremely happy with the results of her transforaminal epidural steroid injection and she refers that she is planning a trip to [place] around [place].  In order to be able to enjoyed the trip and walk to see all the sites, she has started walking and exercising in the swimming pool.  She believes that this is the reason why she has been having recurrence of her low back and thigh pain.  She refers that after that procedure she had 100% relief of the pain that lasted for approximately 2 days.  After that, it went down to the current ongoing 50%.  Today we have talked about alternatives including the fact that we can complete a series of 3.  I have  also offered to treat her left hip pain by doing intra-articular hip joint injections.  I have explained to the patient that the only  thing that we would like to do is to put at least 2 weeks between steroid injections so that there is no accumulation.  She understood and accepted. ? ?She has decided to start by having a second transforaminal epidural steroid injection and then she will have the hip injections just before going to [place].  In order to control the pain that she may experience during that trip, we have come up with a plan where we will be providing her with a prescription of a steroid taper to use only in the case of an emergency.  She was instructed that once she starts the taper, she should not stop it midway.  She understood and accepted. ? ?Post-procedure evaluation  ?  Type: Trans-Foraminal Epidural Steroid Injection (Lumbar) #1  ?Laterality: Bilateral  ?Level: L1 & L2  ?Imaging: Fluoroscopic guidance ?Anesthesia: Local anesthesia (1-2% Lidocaine) ?Anxiolysis: None                 ?Sedation: None. ?DOS: 07/27/2021  ?Performed by: Gaspar Cola, MD ? ?Purpose: Diagnostic/Therapeutic ?Indications: Lumbar radicular pain severe enough to impact quality of life or function. ?1. DDD (degenerative disc disease), lumbar   ?2. Lumbar foraminal stenosis (Multilevel) (Bilateral) (R>L: T12-L1, L1-2, L5-S1) (L>R: L2-3, L3-4,)   ?3. Lumbosacral foraminal stenosis (Multilevel) (Bilateral)   ?4. Retrolisthesis of L1 over L2 and L2 over L3   ?5. Abnormal MRI, lumbar spine (02/24/2021)   ?6. Chronic low back pain (1ry area of Pain) (Bilateral) (R>L) w/o sciatica   ?7. Chronic anticoagulation (Eliquis)   ? ?NAS-11 Pain score:  ? Pre-procedure: 3 /10  ? Post-procedure: 0-No pain/10  ? ?   ?Effectiveness:  ?Initial hour after procedure: 100 %. ?Subsequent 4-6 hours post-procedure: 100 %. ?Analgesia past initial 6 hours: 50 % (Current). ?Ongoing improvement:  ?Analgesic: The patient indicates having an ongoing 50% relief of her low back pain and her thigh pain. ?Function: [name] reports improvement in function ?ROM: [name] reports  improvement in ROM ? ?Pharmacotherapy Assessment  ?Analgesic: None ?MME/day: 0 mg/day  ? ?Monitoring: ?[place] PMP: PDMP reviewed during this encounter.       ?Pharmacotherapy: No side-effects or adverse reactions reported. ?Compliance: No problems identified. ?Effectiveness: Clinically acceptable. ? ?No notes on file  UDS:  ?Summary  ?Date Value Ref Range Status  ?06/16/2021 Note  Final  ?  Comment:  ?  ==================================================================== ?Compliance Drug Analysis, Ur ?==================================================================== ?Test                             Result       Flag       Units ? ?Drug Absent but Declared for Prescription Verification ?  Metaxalone                     Not Detected UNEXPECTED ?==================================================================== ?Test                      Result    Flag   Units      Ref Range ?  Creatinine              26               mg/dL      >=20 ?==================================================================== ?Declared Medications: ? The flagging  and interpretation on this report are based on the ? following declared medications.  Unexpected results may arise from ? inaccuracies in the declared medications. ? ? **Note: The testing scope of this panel includes these medications: ? ? Metaxalone (Skelaxin) ? ? **Note: The testing scope of this panel does not include the ? following reported medications: ? ? Apixaban (Eliquis) ? Bisoprolol (Zebeta) ? Calcium (Tums) ? Cholecalciferol ? Cyanocobalamin ? Furosemide (Lasix) ? Loperamide (Imodium) ? Multivitamin ? Potassium (Klor-Con) ?==================================================================== ?For clinical consultation, please call [phone]. ?==================================================================== ?  ?  ? ?ROS  ?Constitutional: Denies any fever or chills ?Gastrointestinal: No reported hemesis, hematochezia, vomiting, or acute GI  distress ?Musculoskeletal: Denies any acute onset joint swelling, redness, loss of ROM, or weakness ?Neurological: No reported episodes of acute onset apraxia, aphasia, dysarthria, agnosia, amnesia, paralysis, loss of coordination, or loss o

## 2021-08-12 ENCOUNTER — Ambulatory Visit: Payer: Medicare HMO | Admitting: Family Medicine

## 2021-08-13 DIAGNOSIS — I4891 Unspecified atrial fibrillation: Secondary | ICD-10-CM | POA: Diagnosis not present

## 2021-08-13 DIAGNOSIS — I5030 Unspecified diastolic (congestive) heart failure: Secondary | ICD-10-CM

## 2021-08-13 NOTE — Patient Instructions (Signed)
Visit Information ?It was great speaking with you today!  Please let me know if you have any questions about our visit. ? ? Goals Addressed   ? ?  ?  ?  ?  ? This Visit's Progress  ?  Track and Manage Fluids and Swelling-Heart Failure     ?  Timeframe:  Long-Range Goal ?Priority:  High ?Start Date: 08/13/21                            ?Expected End Date: 08/14/22                     ? ?Follow Up within 90 days ?  ?- call office if I gain more than 2 pounds in one day or 5 pounds in one week ?- use salt in moderation ?- watch for swelling in feet, ankles and legs every day ?- weigh myself daily  ?  ?Why is this important?   ?It is important to check your weight daily and watch how much salt and liquids you have.  ?It will help you to manage your heart failure.  ?  ?Notes:  ?  ? ?  ? ? ?Patient Care Plan: General Pharmacy (Adult)  ?  ? ?Problem Identified: Hypertension, Atrial Fibrillation, and Heart Failure   ?Priority: High  ?  ? ?Long-Range Goal: Patient-Specific Goal   ?Start Date: 08/13/2021  ?Expected End Date: 08/14/2022  ?This Visit's Progress: On track  ?Priority: High  ?Note:   ?Current Barriers:  ?Unable to independently afford treatment regimen ? ?Pharmacist Clinical Goal(s):  ?Patient will maintain control of heart failure as evidenced by stable weight, blood pressure  through collaboration with PharmD and provider.  ? ?Interventions: ?1:1 collaboration with Brita Romp, Dionne Bucy, MD regarding development and update of comprehensive plan of care as evidenced by provider attestation and co-signature ?Inter-disciplinary care team collaboration (see longitudinal plan of care) ?Comprehensive medication review performed; medication list updated in electronic medical record ? ?Heart Failure (Goal: manage symptoms and prevent exacerbations) ?-Controlled ?-Last ejection fraction: 60-65% (Date: 04/16/21) ?-HF type: Diastolic ?-NYHA Class: II (slight limitation of activity) ?-Current treatment: ?Bisoprolol 5 mg 1.5  tablets daily: Appropriate, Effective, Safe, Accessible ?Furosemide 40 mg daily: Appropriate, Effective, Safe, Accessible  ?-Medications previously tried: NA  ?-Recommended to continue current medication ? ?Atrial Fibrillation (Goal: prevent stroke and major bleeding) ?-Controlled ?-CHADSVASC: 6 ?-Current treatment: ?Rate control: Bisoprolol 5 mg 1.5 tablets daily: Appropriate, Effective, Safe, Accessible ?Anticoagulation: Eliquis 5 mg twice daily: Appropriate, Effective, Safe, Accessible  ?-Medications previously tried: NA ?-Does not think she will be able to afford dofetilide  ?-Has been denied PAP for Eliquis ?-Recommended to continue current medication ? ?Patient Goals/Self-Care Activities ?Patient will:  ?- check blood pressure 2-3 times weekly, document, and provide at future appointments ?weigh daily, and contact provider if weight gain of greater than 2 pounds in 24 hours  ? ?Follow Up Plan: Telephone follow up appointment with care management team member scheduled for:  01/25/2022 at 3:00 PM ?  ? ? ? ?Patient agreed to services and verbal consent obtained.  ? ?The patient verbalized understanding of instructions, educational materials, and care plan provided today and declined offer to receive copy of patient instructions, educational materials, and care plan.  ? ?Junius Argyle, PharmD, BCACP, CPP  ?Clinical Pharmacist Practitioner  ?[place] ?[phone]  ?

## 2021-08-16 ENCOUNTER — Ambulatory Visit (INDEPENDENT_AMBULATORY_CARE_PROVIDER_SITE_OTHER): Payer: Medicare HMO | Admitting: Family Medicine

## 2021-08-16 ENCOUNTER — Encounter: Payer: Self-pay | Admitting: Family Medicine

## 2021-08-16 VITALS — BP 123/84 | HR 74 | Temp 98.4°F | Resp 16 | Wt 219.3 lb

## 2021-08-16 DIAGNOSIS — J432 Centrilobular emphysema: Secondary | ICD-10-CM

## 2021-08-16 DIAGNOSIS — E7800 Pure hypercholesterolemia, unspecified: Secondary | ICD-10-CM | POA: Diagnosis not present

## 2021-08-16 DIAGNOSIS — I10 Essential (primary) hypertension: Secondary | ICD-10-CM

## 2021-08-16 DIAGNOSIS — E669 Obesity, unspecified: Secondary | ICD-10-CM | POA: Diagnosis not present

## 2021-08-16 DIAGNOSIS — G4733 Obstructive sleep apnea (adult) (pediatric): Secondary | ICD-10-CM

## 2021-08-16 MED ORDER — EZETIMIBE 10 MG PO TABS: 10.0000 mg | ORAL_TABLET | Freq: Every day | ORAL | 3 refills | Status: DC

## 2021-08-16 MED ORDER — SPIRIVA RESPIMAT 2.5 MCG/ACT IN AERS: 2.0000 | INHALATION_SPRAY | Freq: Every day | RESPIRATORY_TRACT | 1 refills | Status: DC

## 2021-08-16 NOTE — Assessment & Plan Note (Signed)
Discussed importance of healthy weight management Discussed diet and exercise  

## 2021-08-16 NOTE — Assessment & Plan Note (Signed)
Reviewed last lipid panel ?Not currently on a statin ?Will resume Zetia ?Recheck FLP and CMP ?Discussed diet and exercise  ?

## 2021-08-16 NOTE — Assessment & Plan Note (Signed)
Well controlled Continue current medications Recheck metabolic panel F/u in 6 months  

## 2021-08-16 NOTE — Assessment & Plan Note (Signed)
Cannot tolerate CPAP ?Saw ENT about Lauren Lloyd and was told that she couldn't be referred ?

## 2021-08-16 NOTE — Progress Notes (Signed)
?  ? ?I,Sulibeya S Dimas,acting as a scribe for Lavon Paganini, MD.,have documented all relevant documentation on the behalf of Lavon Paganini, MD,as directed by  Lavon Paganini, MD while in the presence of Lavon Paganini, MD. ? ? ?Established patient visit ? ? ?Patient: Lauren Lloyd   DOB: 01-May-1941   81 y.o. Female  MRN: [id number] ?Visit Date: 08/16/2021 ? ?Today's healthcare provider: Lavon Paganini, MD  ? ?Chief Complaint  ?Patient presents with  ? Hypertension  ? Hyperlipidemia  ? ?Subjective  ?  ?HPI  ?Hypertension, follow-up ? ?BP Readings from Last 3 Encounters:  ?08/16/21 123/84  ?08/10/21 127/90  ?07/27/21 120/86  ? Wt Readings from Last 3 Encounters:  ?08/16/21 219 lb 4.8 oz (99.5 kg)  ?08/10/21 216 lb (98 kg)  ?07/27/21 216 lb (98 kg)  ?  ? ?She was last seen for hypertension 6 months ago.  ?BP at that visit was 100/66. Management since that visit includes no changes. Patient's cardiologist changed bisoprolol to 7.42m daily.  ? ?She reports fair compliance with treatment. She is taking 53mdaily.  ?She is having side effects. Extreme fatigue ?She is following a Regular diet. ?She is exercising. ?She does not smoke. ? ?Use of agents associated with hypertension: none.  ? ?Outside blood pressures are stable. ?Symptoms: ?No chest pain No chest pressure  ?No palpitations No syncope  ?Yes dyspnea No orthopnea  ?No paroxysmal nocturnal dyspnea No lower extremity edema  ? ?Pertinent labs ?Lab Results  ?Component Value Date  ? CHOL 289 (H) 08/03/2020  ? HDL 65 08/03/2020  ? LDLCALC 188 (H) 08/03/2020  ? TRIG 193 (H) 08/03/2020  ? CHOLHDL 4.4 08/03/2020  ? Lab Results  ?Component Value Date  ? NA 144 02/12/2021  ? K 4.4 02/12/2021  ? CREATININE 0.96 02/12/2021  ? EGFR 59 (L) 02/12/2021  ? GLUCOSE 107 (H) 02/12/2021  ? TSH 1.020 08/03/2020  ?  ? ?The ASCVD Risk score (Arnett DK, et al., 2019) failed to calculate for the following reasons: ?  The 2019 ASCVD risk score is only valid for ages 4067o  7956 ?--------------------------------------------------------------------------------------------------- ?Lipid/Cholesterol, Follow-up ? ?Last lipid panel Other pertinent labs  ?Lab Results  ?Component Value Date  ? CHOL 289 (H) 08/03/2020  ? HDL 65 08/03/2020  ? LDLCALC 188 (H) 08/03/2020  ? TRIG 193 (H) 08/03/2020  ? CHOLHDL 4.4 08/03/2020  ? Lab Results  ?Component Value Date  ? ALT 14 09/24/2020  ? AST 18 09/24/2020  ? PLT 286 02/12/2021  ? TSH 1.020 08/03/2020  ?  ? ?She was last seen for this 6 months ago.  ?Management since that visit includes no changes. ? ?She reports excellent compliance with treatment. ?She is not having side effects.  ? ?Symptoms: ?No chest pain No chest pressure/discomfort  ?Yes dyspnea No lower extremity edema  ?No numbness or tingling of extremity No orthopnea  ?No palpitations No paroxysmal nocturnal dyspnea  ?No speech difficulty No syncope  ? ?Current diet: in general, a "healthy" diet   ?Current exercise: walking ? ?The ASCVD Risk score (Arnett DK, et al., 2019) failed to calculate for the following reasons: ?  The 2019 ASCVD risk score is only valid for ages 4083o 7942 ? ?Tried Zetia per cardiology but stopped it due to rash. But now thinks that isfrom B12 ?--------------------------------------------------------------------------------------------------- ?Continues to have SOB. No longer on Spiriva, but it was helpful in the past.  Would like to take something again. ? ?Medications: ?Outpatient Medications Prior  to Visit  ?Medication Sig  ? acetaminophen (TYLENOL) 325 MG tablet Take 650 mg by mouth every 6 (six) hours as needed.  ? bisoprolol (ZEBETA) 5 MG tablet Take 1.5 tablets (7.5 mg total) by mouth in the morning and at bedtime.  ? calcium carbonate (TUMS EX) 750 MG chewable tablet Chew 2 tablets by mouth daily as needed for heartburn.  ? Cholecalciferol 25 MCG (1000 UT) tablet Take 1,000 Units by mouth daily.  ? ELIQUIS 5 MG TABS tablet TAKE ONE TABLET BY MOUTH TWICE  DAILY  ? furosemide (LASIX) 40 MG tablet Take 1 tablet (40 mg total) by mouth daily. Take extra 40 mg as needed after lunch for abdominal swelling, leg swelling, or shortness of breath  ? loperamide (IMODIUM) 2 MG capsule Take 2 mg by mouth as needed for diarrhea or loose stools.  ? metaxalone (SKELAXIN) 800 MG tablet Take 800 mg by mouth daily as needed for muscle spasms.  ? Multiple Vitamin (MULTIVITAMIN) capsule Take 1 capsule by mouth daily.  ? potassium chloride (KLOR-CON) 10 MEQ tablet TAKE 1 TABLET BY MOUTH DAILY  ? predniSONE (DELTASONE) 20 MG tablet Take 3 tablets (60 mg total) by mouth daily with breakfast for 3 days, THEN 2 tablets (40 mg total) daily with breakfast for 3 days, THEN 1 tablet (20 mg total) daily with breakfast for 3 days.  ? ?No facility-administered medications prior to visit.  ? ? ?Review of Systems  ?Constitutional:  Positive for activity change and fatigue. Negative for appetite change.  ?Respiratory:  Positive for shortness of breath. Negative for chest tightness.   ? ?Last vitamin B12 and Folate ?Lab Results  ?Component Value Date  ? [id number] 1,246 (H) 06/16/2021  ? ?  ?  Objective  ?  ?BP 123/84 (BP Location: Left Arm, Patient Position: Sitting, Cuff Size: Large)   Pulse 74   Temp 98.4 ?F (36.9 ?C) (Oral)   Resp 16   Wt 219 lb 4.8 oz (99.5 kg)   BMI 34.35 kg/m?  ?BP Readings from Last 3 Encounters:  ?08/16/21 123/84  ?08/10/21 127/90  ?07/27/21 120/86  ? ?Wt Readings from Last 3 Encounters:  ?08/16/21 219 lb 4.8 oz (99.5 kg)  ?08/10/21 216 lb (98 kg)  ?07/27/21 216 lb (98 kg)  ? ?  ? ?Physical Exam ?Vitals reviewed.  ?Constitutional:   ?   General: She is not in acute distress. ?   Appearance: Normal appearance. She is well-developed. She is not diaphoretic.  ?HENT:  ?   Head: Normocephalic and atraumatic.  ?Eyes:  ?   General: No scleral icterus. ?   Conjunctiva/sclera: Conjunctivae normal.  ?Neck:  ?   Thyroid: No thyromegaly.  ?Cardiovascular:  ?   Rate and Rhythm: Normal  rate. Rhythm irregularly irregular.  ?   Pulses: Normal pulses.  ?   Heart sounds: Normal heart sounds. No murmur heard. ?Pulmonary:  ?   Effort: Pulmonary effort is normal. No respiratory distress.  ?   Breath sounds: Normal breath sounds. No wheezing, rhonchi or rales.  ?Musculoskeletal:  ?   Cervical back: Neck supple.  ?   Right lower leg: No edema.  ?   Left lower leg: No edema.  ?Lymphadenopathy:  ?   Cervical: No cervical adenopathy.  ?Skin: ?   General: Skin is warm and dry.  ?   Findings: No rash.  ?Neurological:  ?   Mental Status: She is alert and oriented to person, place, and time. Mental status is at baseline.  ?  Psychiatric:     ?   Mood and Affect: Mood normal.     ?   Behavior: Behavior normal.  ?  ? ? ?No results found for any visits on 08/16/21. ? Assessment & Plan  ?  ? ?Problem List Items Addressed This Visit   ? ?  ? Cardiovascular and Mediastinum  ? Essential hypertension - Primary  ?  Well controlled ?Continue current medications ?Recheck metabolic panel ?F/u in 6 months  ?  ?  ? Relevant Medications  ? ezetimibe (ZETIA) 10 MG tablet  ? Other Relevant Orders  ? Comprehensive metabolic panel  ?  ? Respiratory  ? OSA (obstructive sleep apnea)  ?  Cannot tolerate CPAP ?Saw ENT about Dawna Part and was told that she couldn't be referred ?  ?  ? Centrilobular emphysema ([place])  ?  Chronic and uncontrolled with ongoing SOB ?SOB may also be related to persistent afib ?Did well on spiriva in the past - samples given today x69m ?  ?  ? Relevant Medications  ? Tiotropium Bromide Monohydrate (SPIRIVA RESPIMAT) 2.5 MCG/ACT AERS  ?  ? Other  ? Hypercholesteremia  ?  Reviewed last lipid panel ?Not currently on a statin ?Will resume Zetia ?Recheck FLP and CMP ?Discussed diet and exercise  ?  ?  ? Relevant Medications  ? ezetimibe (ZETIA) 10 MG tablet  ? Other Relevant Orders  ? Comprehensive metabolic panel  ? Lipid panel  ? Obesity (BMI 30-39.9)  ?  Discussed importance of healthy weight management ?Discussed  diet and exercise  ?  ?  ?  ? ?Return in about 6 months (around 02/15/2022) for CPE, AWV.  ?   ? ?I, ALavon Paganini MD, have reviewed all documentation for this visit. The documentation on 08/16/21 for the exa

## 2021-08-16 NOTE — Assessment & Plan Note (Signed)
Chronic and uncontrolled with ongoing SOB ?SOB may also be related to persistent afib ?Did well on spiriva in the past - samples given today x61m ?

## 2021-08-18 ENCOUNTER — Telehealth: Payer: Self-pay | Admitting: Pain Medicine

## 2021-08-18 NOTE — Telephone Encounter (Signed)
Patient has called and states that she has TFESI scheduled for tomorrow, 08/19/21 and will really need an injection in her left hip prior to travel on 09/02/21 which would make her needing procedure on 08/31/21.  That would be just a few days early than 14 days.  Wonders if she can do this, if not she will cancel for tomorrow TFESI.  ?

## 2021-08-18 NOTE — Telephone Encounter (Signed)
I have spoken with FN and he will inject patient as needed.  However, he is out of town on the day that she needs to come in for hip injection prior to trip.  My advise would be to come for appt on tomorrow and discuss with FN if he could refer her to one of his partners for injection prior to travel.  Patient verbalizes u/o information.  ?

## 2021-08-18 NOTE — Telephone Encounter (Signed)
Patient has concerned about her procedure that scheduled for 08-19-21. Please give pt an call.  ?

## 2021-08-19 ENCOUNTER — Ambulatory Visit: Payer: Medicare HMO | Attending: Pain Medicine | Admitting: Pain Medicine

## 2021-08-19 ENCOUNTER — Other Ambulatory Visit: Payer: Self-pay | Admitting: Pain Medicine

## 2021-08-19 DIAGNOSIS — M25552 Pain in left hip: Secondary | ICD-10-CM

## 2021-08-19 DIAGNOSIS — M1612 Unilateral primary osteoarthritis, left hip: Secondary | ICD-10-CM

## 2021-08-19 DIAGNOSIS — G8929 Other chronic pain: Secondary | ICD-10-CM

## 2021-08-19 DIAGNOSIS — Z7901 Long term (current) use of anticoagulants: Secondary | ICD-10-CM

## 2021-08-19 NOTE — Progress Notes (Signed)
The patient had to come in today for her Lumbar TFESI.  However, she has decided that it is more important for her to have the hip injection done approximately 2 days before she goes on her trip to [place] and therefore she decided to cancel today's visit and reschedule the TFESI to a therapeutic left intra-articular hip joint injection, 2 days before her trip.  The patient refers that she used to have this injections done by [facility], but we have communicated to the patient that we also offer that type of therapy here.  Because I will be out on vacation at that time, we have offered her to have the injection done by my partner Dr. Gillis Santa.  She has agreed to this and therefore we will be putting this procedure on his schedule.  Today I asked him if he would mind and he indicated that it would be perfectly okay. ?

## 2021-08-25 ENCOUNTER — Other Ambulatory Visit: Payer: Self-pay | Admitting: *Deleted

## 2021-08-25 MED ORDER — FUROSEMIDE 40 MG PO TABS: 40.0000 mg | ORAL_TABLET | Freq: Every day | ORAL | 0 refills | Status: DC

## 2021-08-30 ENCOUNTER — Ambulatory Visit
Payer: Medicare HMO | Attending: Student in an Organized Health Care Education/Training Program | Admitting: Student in an Organized Health Care Education/Training Program

## 2021-08-30 DIAGNOSIS — G8929 Other chronic pain: Secondary | ICD-10-CM

## 2021-08-30 DIAGNOSIS — M25552 Pain in left hip: Secondary | ICD-10-CM

## 2021-08-30 DIAGNOSIS — M1612 Unilateral primary osteoarthritis, left hip: Secondary | ICD-10-CM

## 2021-08-30 LAB — COMPREHENSIVE METABOLIC PANEL

## 2021-08-30 LAB — LIPID PANEL

## 2021-08-30 MED ORDER — METHYLPREDNISOLONE ACETATE 40 MG/ML IJ SUSP
INTRAMUSCULAR | Status: AC
  Filled 2021-08-30: qty 1

## 2021-08-30 MED ORDER — IOHEXOL 180 MG/ML  SOLN
INTRAMUSCULAR | Status: AC
  Filled 2021-08-30: qty 20

## 2021-08-30 MED ORDER — ROPIVACAINE HCL 2 MG/ML IJ SOLN
INTRAMUSCULAR | Status: AC
  Filled 2021-08-30: qty 20

## 2021-08-30 MED ORDER — LIDOCAINE HCL URETHRAL/MUCOSAL 2 % EX GEL
CUTANEOUS | Status: AC
  Filled 2021-08-30: qty 12

## 2021-08-30 NOTE — Progress Notes (Signed)
Patient presents today for left intra-articular hip steroid injection.  She did not discontinue her Eliquis.  For this reason we will delay her injection until tomorrow.  Her last dose of Eliquis was today and I instructed her to not take her dose tomorrow.  She will follow-up tomorrow for a left intra-articular hip steroid injection. ?

## 2021-08-30 NOTE — Progress Notes (Signed)
Safety precautions to be maintained throughout the outpatient stay will include: orient to surroundings, keep bed in low position, maintain call bell within reach at all times, provide assistance with transfer out of bed and ambulation.  

## 2021-08-31 ENCOUNTER — Ambulatory Visit
Admission: RE | Admit: 2021-08-31 | Discharge: 2021-08-31 | Disposition: A | Discharge status: Home / Self Care | Payer: Medicare HMO | Source: Ambulatory Visit | Attending: Student in an Organized Health Care Education/Training Program | Admitting: Student in an Organized Health Care Education/Training Program

## 2021-08-31 ENCOUNTER — Ambulatory Visit (HOSPITAL_BASED_OUTPATIENT_CLINIC_OR_DEPARTMENT_OTHER): Payer: Medicare HMO | Admitting: Student in an Organized Health Care Education/Training Program

## 2021-08-31 VITALS — BP 123/94 | HR 94 | Temp 97.3°F | Resp 19 | Ht 67.0 in | Wt 215.0 lb

## 2021-08-31 DIAGNOSIS — M1612 Unilateral primary osteoarthritis, left hip: Secondary | ICD-10-CM | POA: Insufficient documentation

## 2021-08-31 DIAGNOSIS — M25552 Pain in left hip: Secondary | ICD-10-CM | POA: Diagnosis not present

## 2021-08-31 DIAGNOSIS — G8929 Other chronic pain: Secondary | ICD-10-CM | POA: Diagnosis not present

## 2021-08-31 MED ORDER — LIDOCAINE HCL 2 % IJ SOLN
20.0000 mL | Freq: Once | INTRAMUSCULAR | Status: AC
  Administered 2021-08-31: 400 mg
  Filled 2021-08-31: qty 240

## 2021-08-31 MED ORDER — IOHEXOL 180 MG/ML  SOLN
10.0000 mL | Freq: Once | INTRAMUSCULAR | Status: AC
  Administered 2021-08-31: 10 mL via INTRA_ARTICULAR
  Filled 2021-08-31: qty 20

## 2021-08-31 MED ORDER — METHYLPREDNISOLONE ACETATE 80 MG/ML IJ SUSP
80.0000 mg | Freq: Once | INTRAMUSCULAR | Status: AC
  Administered 2021-08-31: 80 mg via INTRA_ARTICULAR
  Filled 2021-08-31: qty 1

## 2021-08-31 MED ORDER — ROPIVACAINE HCL 2 MG/ML IJ SOLN
4.0000 mL | Freq: Once | INTRAMUSCULAR | Status: AC
  Administered 2021-08-31: 20 mL via INTRA_ARTICULAR
  Filled 2021-08-31: qty 20

## 2021-08-31 NOTE — Patient Instructions (Signed)

## 2021-08-31 NOTE — Progress Notes (Signed)
PROVIDER NOTE: Interpretation of information contained herein should be left to medically-trained personnel. Specific patient instructions are provided elsewhere under "Patient Instructions" section of medical record. This document was created in part using STT-dictation technology, any transcriptional errors that may result from this process are unintentional.  ?Patient: Lauren Lloyd ?Type: Established ?DOB: [birth year] ?MRN: [id number] ?PCP: [name], MD  Service: Procedure ?DOS: 08/31/2021 ?Setting: Ambulatory ?Location: Ambulatory outpatient facility ?Delivery: Face-to-face Provider: Gillis Santa, MD ?Specialty: Interventional Pain Management ?Specialty designation: 09 ?Location: Outpatient facility ?Ref. Prov.: Bacigalupo, Dionne Bucy, MD   ? ?Primary Reason for Visit: Interventional Pain Management Treatment. ?CC: Hip Pain ? ?  ?Procedure:          Anesthesia, Analgesia, Anxiolysis:  ?Type: Intra-Articular Hip Injection #1  ?Primary Purpose: Diagnostic ?Region: Anterolateral hip joint area. ?Level: Lower pelvic and hip joint level. ?Target Area: Superior aspect of the hip joint cavity, going thru the superior portion of the capsular ligament. ?Approach: Anterior approach. ?Laterality: Left  Anesthesia: Local (1-2% Lidocaine)  ?Anxiolysis: None  ?Sedation: None  ?Guidance: Fluoroscopy         ? ? ?Position: Supine ?Prepped Area: Entire Anterolateral hip area. ?DuraPrep (Iodine Povacrylex [0.7% available iodine] and Isopropyl Alcohol, 74% w/w)  ? ?1. Chronic hip pain (Left)   ?2. Osteoarthritis of hip (Left)   ? ?NAS-11 Pain score:  ? Pre-procedure: 4 /10  ? Post-procedure: 0-No pain/10  ? ?Patient's last dose of Eliquis was over 24 hours ago. ? ?  ?Pre-op H&P Assessment:  ?Lauren Lloyd is a 81 y.o. (year old), female patient, seen today for interventional treatment. She  has a past surgical history that includes Hammer toe surgery (05/16/2008); Skin graft (Left, 04/08/2013); Replacement total knee (Right,  05/17/2007); Knee arthroscopy (Right, 05/16/2002); Tonsillectomy (05/16/1944); Cataract extraction w/PHACO (Right, 01/25/2016); Cataract extraction w/PHACO (Left, 02/22/2016); CARDIOVERSION (N/A, 06/15/2018); Total knee arthroplasty (Left, 12/02/2020); and Cardioversion (N/A, 02/18/2021). Lauren Lloyd has a current medication list which includes the following prescription(s): acetaminophen, bisoprolol, calcium carbonate, cholecalciferol, eliquis, ezetimibe, furosemide, loperamide, metaxalone, multivitamin, potassium chloride, and spiriva respimat. Her primarily concern today is the Hip Pain ? ?Initial Vital Signs:  ?Pulse/HCG Rate: 94ECG Heart Rate: (!) 117 ?Temp: (!) 97.3 ?F (36.3 ?C) ?Resp: 16 ?BP: 129/68 ?SpO2: 100 % ? ?BMI: Estimated body mass index is 33.67 kg/m? as calculated from the following: ?  Height as of this encounter: '5\' 7"'$  (1.702 m). ?  Weight as of this encounter: 215 lb (97.5 kg). ? ?Risk Assessment: ?Allergies: Reviewed. She is allergic to levofloxacin, influenza vaccines, and oysters [shellfish allergy].  ?Allergy Precautions: None required ?Coagulopathies: Reviewed. None identified.  ?Blood-thinner therapy: None at this time ?Active Infection(s): Reviewed. None identified. Lauren Lloyd is afebrile ? ?Site Confirmation: Lauren Lloyd was asked to confirm the procedure and laterality before marking the site ?Procedure checklist: Completed ?Consent: Before the procedure and under the influence of no sedative(s), amnesic(s), or anxiolytics, the patient was informed of the treatment options, risks and possible complications. To fulfill our ethical and legal obligations, as recommended by the American Medical Association's Code of Ethics, I have informed the patient of my clinical impression; the nature and purpose of the treatment or procedure; the risks, benefits, and possible complications of the intervention; the alternatives, including doing nothing; the risk(s) and benefit(s) of the alternative  treatment(s) or procedure(s); and the risk(s) and benefit(s) of doing nothing. ?The patient was provided information about the general risks and possible complications associated with the procedure. These may include, but  are not limited to: failure to achieve desired goals, infection, bleeding, organ or nerve damage, allergic reactions, paralysis, and death. ?In addition, the patient was informed of those risks and complications associated to the procedure, such as failure to decrease pain; infection; bleeding; organ or nerve damage with subsequent damage to sensory, motor, and/or autonomic systems, resulting in permanent pain, numbness, and/or weakness of one or several areas of the body; allergic reactions; (i.e.: anaphylactic reaction); and/or death. ?Furthermore, the patient was informed of those risks and complications associated with the medications. These include, but are not limited to: allergic reactions (i.e.: anaphylactic or anaphylactoid reaction(s)); adrenal axis suppression; blood sugar elevation that in diabetics may result in ketoacidosis or comma; water retention that in patients with history of congestive heart failure may result in shortness of breath, pulmonary edema, and decompensation with resultant heart failure; weight gain; swelling or edema; medication-induced neural toxicity; particulate matter embolism and blood vessel occlusion with resultant organ, and/or nervous system infarction; and/or aseptic necrosis of one or more joints. ?Finally, the patient was informed that Medicine is not an exact science; therefore, there is also the possibility of unforeseen or unpredictable risks and/or possible complications that may result in a catastrophic outcome. The patient indicated having understood very clearly. We have given the patient no guarantees and we have made no promises. Enough time was given to the patient to ask questions, all of which were answered to the patient's satisfaction. Ms.  Lloyd has indicated that she wanted to continue with the procedure. ?Attestation: I, the ordering provider, attest that I have discussed with the patient the benefits, risks, side-effects, alternatives, likelihood of achieving goals, and potential problems during recovery for the procedure that I have provided informed consent. ?Date  Time: 08/31/2021  9:35 AM ? ?Pre-Procedure Preparation:  ?Monitoring: As per clinic protocol. Respiration, ETCO2, SpO2, BP, heart rate and rhythm monitor placed and checked for adequate function ?Safety Precautions: Patient was assessed for positional comfort and pressure points before starting the procedure. ?Time-out: I initiated and conducted the "Time-out" before starting the procedure, as per protocol. The patient was asked to participate by confirming the accuracy of the "Time Out" information. Verification of the correct person, site, and procedure were performed and confirmed by me, the nursing staff, and the patient. "Time-out" conducted as per Joint Commission's Universal Protocol (UP.01.01.01). ?Time: 0959 ? ?Description of Procedure:          ?Safety Precautions: Aspiration looking for blood return was conducted prior to all injections. At no point did we inject any substances, as a needle was being advanced. No attempts were made at seeking any paresthesias. Safe injection practices and needle disposal techniques used. Medications properly checked for expiration dates. SDV (single dose vial) medications used. ?Description of the Procedure: Protocol guidelines were followed. The patient was placed in position over the fluoroscopy table. The target area was identified and the area prepped in the usual manner. Skin & deeper tissues infiltrated with local anesthetic. Appropriate amount of time allowed to pass for local anesthetics to take effect. The procedure needles were then advanced to the target area. Proper needle placement secured. Negative aspiration confirmed.  Solution injected in intermittent fashion, asking for systemic symptoms every 0.5cc of injectate. The needles were then removed and the area cleansed, making sure to leave some of the prepping solution back to take advant

## 2021-09-01 ENCOUNTER — Other Ambulatory Visit: Payer: Self-pay

## 2021-09-01 ENCOUNTER — Telehealth: Payer: Self-pay

## 2021-09-01 DIAGNOSIS — E7800 Pure hypercholesterolemia, unspecified: Secondary | ICD-10-CM

## 2021-09-01 DIAGNOSIS — I10 Essential (primary) hypertension: Secondary | ICD-10-CM

## 2021-09-01 NOTE — Telephone Encounter (Signed)
States she is doing pretty well today. Instructed to call if needed. ?

## 2021-09-24 NOTE — Telephone Encounter (Addendum)
Pt notified regarding immodium. She will contact her PCP regarding appropriate replacement. She will notify next week if she wishes to proceed with tikosyn as she "cannot leave her house" without immodium.  ?

## 2021-09-30 ENCOUNTER — Encounter: Payer: Self-pay | Admitting: Student in an Organized Health Care Education/Training Program

## 2021-10-04 ENCOUNTER — Encounter: Payer: Self-pay | Admitting: Student in an Organized Health Care Education/Training Program

## 2021-10-04 ENCOUNTER — Ambulatory Visit
Payer: Medicare HMO | Attending: Student in an Organized Health Care Education/Training Program | Admitting: Student in an Organized Health Care Education/Training Program

## 2021-10-04 DIAGNOSIS — Z7901 Long term (current) use of anticoagulants: Secondary | ICD-10-CM | POA: Diagnosis not present

## 2021-10-04 DIAGNOSIS — M1612 Unilateral primary osteoarthritis, left hip: Secondary | ICD-10-CM | POA: Diagnosis not present

## 2021-10-04 DIAGNOSIS — M25552 Pain in left hip: Secondary | ICD-10-CM | POA: Diagnosis not present

## 2021-10-04 DIAGNOSIS — G894 Chronic pain syndrome: Secondary | ICD-10-CM

## 2021-10-04 DIAGNOSIS — G8929 Other chronic pain: Secondary | ICD-10-CM | POA: Diagnosis not present

## 2021-10-04 NOTE — Progress Notes (Signed)
Patient: Lauren Lloyd  Service Category: E/M  Provider: Gillis Santa, MD  DOB: [birth year]  DOS: 10/04/2021  Location: Office  MRN: [id number]  Setting: Ambulatory outpatient  Referring Provider: [name], MD  Type: Established Patient  Specialty: Interventional Pain Management  PCP: [name], MD  Location: Remote location  Delivery: TeleHealth     Virtual Encounter - Pain Management PROVIDER NOTE: Information contained herein reflects review and annotations entered in association with encounter. Interpretation of such information and data should be left to medically-trained personnel. Information provided to patient can be located elsewhere in the medical record under "Patient Instructions". Document created using STT-dictation technology, any transcriptional errors that may result from process are unintentional.    Contact & Pharmacy Preferred: [phone] Home: [phone] (home) Mobile: [phone] (mobile) E-mail: [email or web address]  [place], [place] [address] [place] [address] Phone: [phone] Fax: [phone]   Pre-screening  Ms. Remache offered "in-person" vs "virtual" encounter. She indicated preferring virtual for this encounter.   Reason COVID-19*  Social distancing based on CDC and AMA recommendations.   I contacted [name] on 10/04/2021 via telephone.      I clearly identified myself as Gillis Santa, MD. I verified that I was speaking with the correct person using two identifiers (Name: KEIOSHA CANCRO, and date of birth: 06-09-1940).  Consent I sought verbal advanced consent from [name] for virtual visit interactions. I informed Ms. Milroy of possible security and privacy concerns, risks, and limitations associated with providing "not-in-person" medical evaluation and management services. I also informed Ms. Sarver of the availability of "in-person" appointments. Finally, I informed her that  there would be a charge for the virtual visit and that she could be  personally, fully or partially, financially responsible for it. Ms. Deeg expressed understanding and agreed to proceed.   Historic Elements   Ms. REVECA DESMARAIS is a 81 y.o. year old, female patient evaluated today after our last contact on 08/31/2021. Ms. Thielman  has a past medical history of (HFpEF) heart failure with preserved ejection fraction ([place]), Arthritis, Arthritis of knee, Back pain, Carotid arterial disease ([place]), Cholelithiasis, Edema, lower extremity, Fatty liver, GERD (gastroesophageal reflux disease), History of stress test, Knee pain, Lactose intolerance, Mitral regurgitation, Multinodular goiter, Obesity, OSA (obstructive sleep apnea), PAF (paroxysmal atrial fibrillation) ([place]), PAH (pulmonary artery hypertension) ([place]), Scoliosis, SOB (shortness of breath), and Swallowing difficulty. She also  has a past surgical history that includes Hammer toe surgery (05/16/2008); Skin graft (Left, 04/08/2013); Replacement total knee (Right, 05/17/2007); Knee arthroscopy (Right, 05/16/2002); Tonsillectomy (05/16/1944); Cataract extraction w/PHACO (Right, 01/25/2016); Cataract extraction w/PHACO (Left, 02/22/2016); CARDIOVERSION (N/A, 06/15/2018); Total knee arthroplasty (Left, 12/02/2020); and Cardioversion (N/A, 02/18/2021). Ms. Wisner has a current medication list which includes the following prescription(s): acetaminophen, bisoprolol, calcium carbonate, cholecalciferol, eliquis, ezetimibe, furosemide, loperamide, metaxalone, multivitamin, potassium chloride, and spiriva respimat. She  reports that she quit smoking about 32 years ago. Her smoking use included cigarettes. She has a 30.00 pack-year smoking history. She has never used smokeless tobacco. She reports current alcohol use of about 7.0 standard drinks per week. She reports that she does not use drugs. Ms. Rhude is allergic to levofloxacin, influenza vaccines, and oysters [shellfish  allergy].   HPI  Today, she is being contacted for a post-procedure assessment.   Post-procedure evaluation    Procedure:          Anesthesia, Analgesia, Anxiolysis:  Type: Intra-Articular Hip  Injection #1  Primary Purpose: Diagnostic Region: Anterolateral hip joint area. Level: Lower pelvic and hip joint level. Target Area: Superior aspect of the hip joint cavity, going thru the superior portion of the capsular ligament. Approach: Anterior approach. Laterality: Left  Anesthesia: Local (1-2% Lidocaine)  Anxiolysis: None  Sedation: None  Guidance: Fluoroscopy           Position: Supine Prepped Area: Entire Anterolateral hip area. DuraPrep (Iodine Povacrylex [0.7% available iodine] and Isopropyl Alcohol, 74% w/w)   1. Chronic hip pain (Left)   2. Osteoarthritis of hip (Left)    NAS-11 Pain score:   Pre-procedure: 4 /10   Post-procedure: 0-No pain/10   Patient's last dose of Eliquis was over 24 hours ago.     Effectiveness:  Initial hour after procedure: 100 %  Subsequent 4-6 hours post-procedure: 100 %  Analgesia past initial 6 hours: 100 % (Starting to wear off as of this week. started taking pain meds again.)  Ongoing improvement:  Analgesic:  70% Function: Somewhat improved ROM: Somewhat improved   Laboratory Chemistry Profile   Renal Lab Results  Component Value Date   BUN CANCELED 08/16/2021   CREATININE CANCELED 08/16/2021   BCR 21 02/12/2021   GFRAA 75 12/09/2019   GFRNONAA 65 12/08/2020    Hepatic Lab Results  Component Value Date   AST CANCELED 08/16/2021   ALT CANCELED 08/16/2021   ALBUMIN CANCELED 08/16/2021   ALKPHOS CANCELED 08/16/2021    Electrolytes Lab Results  Component Value Date   NA CANCELED 08/16/2021   K CANCELED 08/16/2021   CL CANCELED 08/16/2021   CALCIUM CANCELED 08/16/2021   MG 2.1 06/16/2021    Bone Lab Results  Component Value Date   VD25OH 27.3 (L) 08/03/2020   25OHVITD1 54 06/16/2021   25OHVITD2 4.6 06/16/2021    25OHVITD3 49 06/16/2021    Inflammation (CRP: Acute Phase) (ESR: Chronic Phase) Lab Results  Component Value Date   CRP 5 06/16/2021   ESRSEDRATE 47 (H) 06/16/2021         Note: Above Lab results reviewed.   Assessment  The primary encounter diagnosis was Chronic hip pain (Left). Diagnoses of Osteoarthritis of hip (Left), Chronic anticoagulation (Eliquis), and Chronic pain syndrome were also pertinent to this visit.  Plan of Care   Good relief with left hip intra-articular steroid injection.  She was able to go on her trip to [place] and be more functional and more comfortable during it.  She has an upcoming surgery in July for her left hip replacement which I wished her the best with.  She is free to follow-up with either myself or Dr. Dossie Arbour as needed in the future.   Follow-up plan:   Return if symptoms worsen or fail to improve.     Left hip intra-articular injection 08/31/2021   Recent Visits Date Type Provider Dept  08/31/21 Procedure visit Gillis Santa, MD [facility]  08/30/21 Procedure visit Gillis Santa, MD [facility]  08/10/21 Office Visit Milinda Pointer, MD [facility]  07/27/21 Procedure visit Milinda Pointer, MD [facility]  07/19/21 Office Visit Milinda Pointer, MD [facility]  Showing recent visits within past 90 days and meeting all other requirements Today's Visits Date Type Provider Dept  10/04/21 Office Visit Gillis Santa, MD [facility]  Showing today's visits and meeting all other requirements Future Appointments No visits were found meeting these conditions. Showing future appointments within next 90 days and meeting all other requirements  I  discussed the assessment and treatment plan with the patient. The patient was provided an opportunity to ask questions and all were answered. The patient agreed with the plan and demonstrated an understanding of the instructions.  Patient  advised to call back or seek an in-person evaluation if the symptoms or condition worsens.  Duration of encounter: 87mnutes.  Note by: BGillis Santa MD Date: 10/04/2021; Time: 3:07 PM

## 2021-10-07 ENCOUNTER — Telehealth: Payer: Self-pay | Admitting: Cardiovascular Disease

## 2021-10-07 NOTE — Telephone Encounter (Signed)
Pt notified of recc below. Pt appreciative and has no further questions.

## 2021-10-07 NOTE — Telephone Encounter (Signed)
New Message:    Patient said she needs to talk to the nurse or Dr Rockey Situ. She needs to know if she can take Lomotil after taking Tikosyn?   Pt c/o medication issue:  1. Name of Medication: Lomotil  2. How are you currently taking this medication (dosage and times per day)?   3. Are you having a reaction (difficulty breathing--STAT)?   4. What is your medication issue? Can she take Lomotil after taking Tikosyn?

## 2021-10-07 NOTE — Telephone Encounter (Signed)
Ok to take lomotil with [employer]

## 2021-10-08 NOTE — Telephone Encounter (Signed)
Pt spoke with pcp will use lomotil for diarrhea as needed instead of immodium.

## 2021-10-08 NOTE — Addendum Note (Signed)
Addended by: Juluis Mire on: 10/08/2021 10:01 AM   Modules accepted: Orders

## 2021-10-12 NOTE — Progress Notes (Deleted)
      Established patient visit   Patient: Lauren Lloyd   DOB: 06-19-1940   81 y.o. Female  MRN: [id number] Visit Date: 10/14/2021  Today's healthcare provider: Lavon Paganini, MD   No chief complaint on file.  Subjective    HPI  ***  Medications: Outpatient Medications Prior to Visit  Medication Sig   acetaminophen (TYLENOL) 325 MG tablet Take 650 mg by mouth every 6 (six) hours as needed.   bisoprolol (ZEBETA) 5 MG tablet Take 1.5 tablets (7.5 mg total) by mouth in the morning and at bedtime.   calcium carbonate (TUMS EX) 750 MG chewable tablet Chew 2 tablets by mouth daily as needed for heartburn.   Cholecalciferol 25 MCG (1000 UT) tablet Take 1,000 Units by mouth daily.   ELIQUIS 5 MG TABS tablet TAKE ONE TABLET BY MOUTH TWICE DAILY   ezetimibe (ZETIA) 10 MG tablet Take 1 tablet (10 mg total) by mouth daily.   furosemide (LASIX) 40 MG tablet Take 1 tablet (40 mg total) by mouth daily. Take extra 40 mg as needed after lunch for abdominal swelling, leg swelling, or shortness of breath   metaxalone (SKELAXIN) 800 MG tablet Take 800 mg by mouth daily as needed for muscle spasms.   Multiple Vitamin (MULTIVITAMIN) capsule Take 1 capsule by mouth daily.   potassium chloride (KLOR-CON) 10 MEQ tablet TAKE 1 TABLET BY MOUTH DAILY   Tiotropium Bromide Monohydrate (SPIRIVA RESPIMAT) 2.5 MCG/ACT AERS Inhale 2 puffs into the lungs daily.   No facility-administered medications prior to visit.    Review of Systems  {Labs  Heme  Chem  Endocrine  Serology  Results Review (optional):23779}   Objective    There were no vitals taken for this visit. {Show previous vital signs (optional):23777}  Physical Exam  ***  No results found for any visits on 10/14/21.  Assessment & Plan     ***  No follow-ups on file.      {provider attestation***:1}   Lavon Paganini, MD  [facility] [phone] (phone) [phone] (fax)  [place]

## 2021-10-13 ENCOUNTER — Telehealth: Payer: Self-pay | Admitting: Cardiovascular Disease

## 2021-10-13 NOTE — Telephone Encounter (Signed)
Called patient back.   Patient reports that she is to start tikosyn on Monday, and she's going to have to pay $1000 to be in the hospital for 2-3 days.   Patient wants to know if it's going to help her feel better or if it's a waste of time and money.   Advised patient that I will forward her concerns to Dr. Mardene Speak nurse. Pt verbalized understanding and voiced appreciation.

## 2021-10-13 NOTE — Telephone Encounter (Signed)
Patient is calling back to talk with Dr. Rockey Situ or his nurse. Please call back

## 2021-10-14 ENCOUNTER — Ambulatory Visit: Payer: Medicare HMO | Admitting: Family Medicine

## 2021-10-14 ENCOUNTER — Telehealth: Payer: Self-pay

## 2021-10-14 ENCOUNTER — Other Ambulatory Visit: Payer: Self-pay | Admitting: Cardiovascular Disease

## 2021-10-14 NOTE — Telephone Encounter (Signed)
Patient is calling to check on status of call. She would like a call back.

## 2021-10-14 NOTE — Progress Notes (Signed)
Chronic Care Management Pharmacy Assistant   Name: Lauren Lloyd  MRN: [id number] DOB: 1940/06/08  Reason for Encounter:Hypertension Disease State Call.   Recent office visits:  08/16/2021 Dr. Brita Romp MD (PCP) Start Ezetimibe 10 mg daily, start Spiriva 2.5 MCG/ACT 2 puffs daily, Return in about 6 months   Recent consult visits:  10/04/2021 Dr. Holley Raring MD (Pain Medicine) No medication changes noted 08/31/2021 Dr. Holley Raring MD (Pain Medicine) No medication changes noted 08/19/2021 Dr. Dossie Arbour MD (Pain Medicine) No medication changes noted 08/10/2021 Dr. Dossie Arbour MD (Pain Medicine) Start Prednisone 20 mg daily for 3 days 08/02/2021 Malon Kindle MD (Orthopedic surgery) Unable to see note 08/02/2021 Clover Mealy (Diagnostic) Unable to see note  Hospital visits:  None in previous 6 months  Medications: Outpatient Encounter Medications as of 10/14/2021  Medication Sig   acetaminophen (TYLENOL) 325 MG tablet Take 650 mg by mouth every 6 (six) hours as needed.   bisoprolol (ZEBETA) 5 MG tablet Take 1.5 tablets (7.5 mg total) by mouth in the morning and at bedtime.   calcium carbonate (TUMS EX) 750 MG chewable tablet Chew 2 tablets by mouth daily as needed for heartburn.   Cholecalciferol 25 MCG (1000 UT) tablet Take 1,000 Units by mouth daily.   ELIQUIS 5 MG TABS tablet TAKE ONE TABLET BY MOUTH TWICE DAILY   ezetimibe (ZETIA) 10 MG tablet Take 1 tablet (10 mg total) by mouth daily.   furosemide (LASIX) 40 MG tablet Take 1 tablet (40 mg total) by mouth daily. Take extra 40 mg as needed after lunch for abdominal swelling, leg swelling, or shortness of breath   metaxalone (SKELAXIN) 800 MG tablet Take 800 mg by mouth daily as needed for muscle spasms.   Multiple Vitamin (MULTIVITAMIN) capsule Take 1 capsule by mouth daily.   potassium chloride (KLOR-CON) 10 MEQ tablet TAKE 1 TABLET BY MOUTH DAILY   Tiotropium Bromide Monohydrate (SPIRIVA RESPIMAT) 2.5 MCG/ACT AERS Inhale 2 puffs into the  lungs daily.   No facility-administered encounter medications on file as of 10/14/2021.   Care Gaps: COVID-19 Vaccine (4- Booster for [employer] series)  Star Rating Drugs: None ID Medication Fill Gaps: None ID   Reviewed chart prior to disease state call. Spoke with patient regarding BP  Recent Office Vitals: BP Readings from Last 3 Encounters:  08/31/21 (!) 123/94  08/16/21 123/84  08/10/21 127/90   Pulse Readings from Last 3 Encounters:  08/31/21 94  08/16/21 74  08/10/21 64    Wt Readings from Last 3 Encounters:  08/31/21 215 lb (97.5 kg)  08/16/21 219 lb 4.8 oz (99.5 kg)  08/10/21 216 lb (98 kg)     Kidney Function Lab Results  Component Value Date/Time   CREATININE CANCELED 08/16/2021 08:50 AM   CREATININE 0.96 02/12/2021 09:37 AM   CREATININE 0.78 01/19/2017 03:22 PM   GFRNONAA 65 12/08/2020 12:00 AM   GFRNONAA >60 09/24/2020 09:49 AM   GFRNONAA 73 01/19/2017 03:22 PM   GFRAA 75 12/09/2019 02:11 PM   GFRAA 85 01/19/2017 03:22 PM       Latest Ref Rng & Units 08/16/2021    8:50 AM 02/12/2021    9:37 AM 12/08/2020   12:00 AM  BMP  Glucose mg/dL CANCELED   107     BUN  CANCELED   20   18       Creatinine  CANCELED   0.96   0.9       BUN/Creat Ratio 12 - 28  21  Sodium  CANCELED   144   139       Potassium  CANCELED   4.4   4.1       Chloride  CANCELED   104   105       CO2  CANCELED   23   26       Calcium  CANCELED   9.7   8.8          This result is from an external source.    Current antihypertensive regimen:  Lasix 40 mg Daily for pulmonary hypertension Bisoprolol 5 mg take 1.5 tablets (7.5 mg total) by mouth in the morning and at bedtime.   What recent interventions/DTPs have been made by any provider to improve Blood Pressure control since last CPP Visit: None ID  Any recent hospitalizations or ED visits since last visit with CPP? No  I have attempted without success to contact this patient by phone three times to do her hypertension Disease  State call. I left a Voice message for patient to return my call.   Adherence Review: Is the patient currently on ACE/ARB medication? No Does the patient have >5 day gap between last estimated fill dates? No  Telephone follow up appointment with care management team member scheduled for:  01/25/2022 at 3:00 PM  [facility] Pharmacist Assistant [phone]

## 2021-10-14 NOTE — Telephone Encounter (Signed)
Spoke w/ pt.  Advised her of the reason for taking Tikosyn.  She states that she feels better about being hospitalized and paying the $1000 if it will make her feel better.  She is appreciative of the call.

## 2021-10-15 ENCOUNTER — Telehealth: Payer: Self-pay | Admitting: Family Medicine

## 2021-10-15 DIAGNOSIS — G4733 Obstructive sleep apnea (adult) (pediatric): Secondary | ICD-10-CM

## 2021-10-15 NOTE — Telephone Encounter (Signed)
Pt is calling because she went to [facility] to collaborate  her CPAP machine. [facility] is requesting a prescription. Please advise [phone]

## 2021-10-18 ENCOUNTER — Inpatient Hospital Stay (HOSPITAL_COMMUNITY)
Admission: AD | Admit: 2021-10-18 | Discharge: 2021-10-26 | DRG: 243 | Disposition: A | Discharge status: Home / Self Care | Payer: Medicare HMO | Source: Ambulatory Visit | Attending: Cardiology | Admitting: Cardiology

## 2021-10-18 ENCOUNTER — Other Ambulatory Visit (HOSPITAL_COMMUNITY): Payer: Self-pay

## 2021-10-18 ENCOUNTER — Ambulatory Visit (HOSPITAL_COMMUNITY)
Admission: RE | Admit: 2021-10-18 | Discharge: 2021-10-18 | Disposition: A | Discharge status: Home / Self Care | Payer: Medicare HMO | Source: Ambulatory Visit | Attending: Physician Assistant | Admitting: Physician Assistant

## 2021-10-18 VITALS — BP 140/70 | HR 80 | Ht 67.0 in | Wt 219.2 lb

## 2021-10-18 DIAGNOSIS — Z8249 Family history of ischemic heart disease and other diseases of the circulatory system: Secondary | ICD-10-CM

## 2021-10-18 DIAGNOSIS — I110 Hypertensive heart disease with heart failure: Secondary | ICD-10-CM | POA: Diagnosis not present

## 2021-10-18 DIAGNOSIS — E669 Obesity, unspecified: Secondary | ICD-10-CM | POA: Diagnosis present

## 2021-10-18 DIAGNOSIS — I472 Ventricular tachycardia, unspecified: Secondary | ICD-10-CM | POA: Diagnosis present

## 2021-10-18 DIAGNOSIS — G4733 Obstructive sleep apnea (adult) (pediatric): Secondary | ICD-10-CM | POA: Diagnosis present

## 2021-10-18 DIAGNOSIS — Z91013 Allergy to seafood: Secondary | ICD-10-CM | POA: Diagnosis not present

## 2021-10-18 DIAGNOSIS — I2721 Secondary pulmonary arterial hypertension: Secondary | ICD-10-CM | POA: Diagnosis not present

## 2021-10-18 DIAGNOSIS — Z91199 Patient's noncompliance with other medical treatment and regimen due to unspecified reason: Secondary | ICD-10-CM

## 2021-10-18 DIAGNOSIS — E876 Hypokalemia: Secondary | ICD-10-CM | POA: Diagnosis not present

## 2021-10-18 DIAGNOSIS — Z7901 Long term (current) use of anticoagulants: Secondary | ICD-10-CM

## 2021-10-18 DIAGNOSIS — I4819 Other persistent atrial fibrillation: Secondary | ICD-10-CM | POA: Diagnosis not present

## 2021-10-18 DIAGNOSIS — I495 Sick sinus syndrome: Secondary | ICD-10-CM | POA: Diagnosis not present

## 2021-10-18 DIAGNOSIS — K760 Fatty (change of) liver, not elsewhere classified: Secondary | ICD-10-CM | POA: Diagnosis not present

## 2021-10-18 DIAGNOSIS — I5032 Chronic diastolic (congestive) heart failure: Secondary | ICD-10-CM | POA: Diagnosis not present

## 2021-10-18 DIAGNOSIS — Z803 Family history of malignant neoplasm of breast: Secondary | ICD-10-CM | POA: Diagnosis not present

## 2021-10-18 DIAGNOSIS — D6869 Other thrombophilia: Secondary | ICD-10-CM | POA: Insufficient documentation

## 2021-10-18 DIAGNOSIS — Z6834 Body mass index (BMI) 34.0-34.9, adult: Secondary | ICD-10-CM

## 2021-10-18 DIAGNOSIS — J439 Emphysema, unspecified: Secondary | ICD-10-CM | POA: Diagnosis present

## 2021-10-18 DIAGNOSIS — Z823 Family history of stroke: Secondary | ICD-10-CM | POA: Diagnosis not present

## 2021-10-18 DIAGNOSIS — K219 Gastro-esophageal reflux disease without esophagitis: Secondary | ICD-10-CM | POA: Diagnosis not present

## 2021-10-18 LAB — BASIC METABOLIC PANEL
Anion gap: 8 (ref 5–15)
Anion gap: 10 (ref 5–15)
BUN: 28 mg/dL — ABNORMAL HIGH (ref 8–23)
BUN: 27 mg/dL — ABNORMAL HIGH (ref 8–23)
CO2: 24 mmol/L (ref 22–32)
CO2: 22 mmol/L (ref 22–32)
Calcium: 9.3 mg/dL (ref 8.9–10.3)
Calcium: 9.1 mg/dL (ref 8.9–10.3)
Chloride: 108 mmol/L (ref 98–111)
Chloride: 106 mmol/L (ref 98–111)
Creatinine, Ser: 1 mg/dL (ref 0.44–1.00)
Creatinine, Ser: 1 mg/dL (ref 0.44–1.00)
GFR, Estimated: 57 mL/min — ABNORMAL LOW (ref >60)
GFR, Estimated: 57 mL/min — ABNORMAL LOW (ref >60)
Glucose, Bld: 106 mg/dL — ABNORMAL HIGH (ref 70–99)
Glucose, Bld: 116 mg/dL — ABNORMAL HIGH (ref 70–99)
Potassium: 3.6 mmol/L (ref 3.5–5.1)
Potassium: 4.6 mmol/L (ref 3.5–5.1)
Sodium: 140 mmol/L (ref 135–145)
Sodium: 138 mmol/L (ref 135–145)

## 2021-10-18 LAB — MAGNESIUM: Magnesium: 2.1 mg/dL (ref 1.7–2.4)

## 2021-10-18 MED ORDER — SODIUM CHLORIDE 0.9% FLUSH
3.0000 mL | Freq: Two times a day (BID) | INTRAVENOUS | Status: DC
  Administered 2021-10-18 – 2021-10-25 (×13): 3 mL via INTRAVENOUS

## 2021-10-18 MED ORDER — UMECLIDINIUM BROMIDE 62.5 MCG/ACT IN AEPB
1.0000 | INHALATION_SPRAY | Freq: Every day | RESPIRATORY_TRACT | Status: DC
  Administered 2021-10-19 – 2021-10-25 (×7): 1 via RESPIRATORY_TRACT
  Filled 2021-10-18 (×2): qty 7

## 2021-10-18 MED ORDER — BISOPROLOL FUMARATE 5 MG PO TABS
5.0000 mg | ORAL_TABLET | Freq: Two times a day (BID) | ORAL | Status: DC
Start: 2021-10-18 — End: 2021-10-19
  Administered 2021-10-18 – 2021-10-19 (×2): 5 mg via ORAL
  Filled 2021-10-18 (×2): qty 1

## 2021-10-18 MED ORDER — DOFETILIDE 500 MCG PO CAPS
500.0000 ug | ORAL_CAPSULE | Freq: Two times a day (BID) | ORAL | Status: DC
  Administered 2021-10-18 – 2021-10-21 (×6): 500 ug via ORAL
  Filled 2021-10-18 (×6): qty 1

## 2021-10-18 MED ORDER — ACETAMINOPHEN 325 MG PO TABS
650.0000 mg | ORAL_TABLET | Freq: Four times a day (QID) | ORAL | Status: DC | PRN
Start: 2021-10-18 — End: 2021-10-26
  Administered 2021-10-19 – 2021-10-26 (×15): 650 mg via ORAL
  Filled 2021-10-18 (×16): qty 2

## 2021-10-18 MED ORDER — POTASSIUM CHLORIDE CRYS ER 20 MEQ PO TBCR
60.0000 meq | EXTENDED_RELEASE_TABLET | ORAL | Status: AC
  Administered 2021-10-18: 60 meq via ORAL
  Filled 2021-10-18: qty 3

## 2021-10-18 MED ORDER — POTASSIUM CHLORIDE CRYS ER 10 MEQ PO TBCR
10.0000 meq | EXTENDED_RELEASE_TABLET | Freq: Every day | ORAL | Status: DC
  Administered 2021-10-19 – 2021-10-26 (×8): 10 meq via ORAL
  Filled 2021-10-18 (×8): qty 1

## 2021-10-18 MED ORDER — FUROSEMIDE 40 MG PO TABS: 40.0000 mg | ORAL_TABLET | Freq: Every day | ORAL | Status: DC

## 2021-10-18 MED ORDER — SODIUM CHLORIDE 0.9 % IV SOLN
250.0000 mL | INTRAVENOUS | Status: DC | PRN
Start: 2021-10-18 — End: 2021-10-26

## 2021-10-18 MED ORDER — METAXALONE 800 MG PO TABS
800.0000 mg | ORAL_TABLET | Freq: Every day | ORAL | Status: DC | PRN
Start: 2021-10-18 — End: 2021-10-26
  Administered 2021-10-25: 800 mg via ORAL
  Filled 2021-10-18 (×2): qty 1

## 2021-10-18 MED ORDER — TIOTROPIUM BROMIDE MONOHYDRATE 2.5 MCG/ACT IN AERS
2.0000 | INHALATION_SPRAY | Freq: Every day | RESPIRATORY_TRACT | Status: DC
Start: 2021-10-19 — End: 2021-10-18

## 2021-10-18 MED ORDER — APIXABAN 5 MG PO TABS
5.0000 mg | ORAL_TABLET | Freq: Two times a day (BID) | ORAL | Status: AC
  Administered 2021-10-18 – 2021-10-24 (×12): 5 mg via ORAL
  Filled 2021-10-18 (×12): qty 1

## 2021-10-18 MED ORDER — VITAMIN D 25 MCG (1000 UNIT) PO TABS
1000.0000 [IU] | ORAL_TABLET | Freq: Every day | ORAL | Status: DC
  Administered 2021-10-19 – 2021-10-26 (×8): 1000 [IU] via ORAL
  Filled 2021-10-18 (×8): qty 1

## 2021-10-18 MED ORDER — BISOPROLOL FUMARATE 5 MG PO TABS: 5.0000 mg | ORAL_TABLET | Freq: Two times a day (BID) | ORAL | Status: DC

## 2021-10-18 MED ORDER — SODIUM CHLORIDE 0.9% FLUSH
3.0000 mL | INTRAVENOUS | Status: DC | PRN
  Administered 2021-10-23: 3 mL via INTRAVENOUS

## 2021-10-18 MED ORDER — EZETIMIBE 10 MG PO TABS
10.0000 mg | ORAL_TABLET | Freq: Every day | ORAL | Status: DC
  Administered 2021-10-19 – 2021-10-26 (×8): 10 mg via ORAL
  Filled 2021-10-18 (×8): qty 1

## 2021-10-18 NOTE — H&P (Signed)
See the progress note from the atrial fibrillation clinic dated today.  Atrial fibrillation-persistent  COPD-emphysema  Obstructive sleep apnea-not treated  Congestive heart failure-diastolic  Obesity  Hypokalemia   The patient is admitted for initiation of dofetilide.  QTc on ECG today is about 435 ms.  We have reviewed the potential issues with dofetilide specifically QT prolongation and life-threatening arrhythmias as well as the safety data following discharge.  Potassium is low.  Is being repleted.  Hopefully will begin dofetilide tonight at 500 mcg twice daily.  She is volume overloaded.  Once we get her potassium up we should begin intravenous diuresis.  She has struggled to have adequate therapy with her CPAP for sleep apnea.  Recommend outpatient sleep therapy consultation

## 2021-10-18 NOTE — Care Management (Signed)
  Transition of Care [facility]) Screening Note   Patient Details  Name: Lauren Lloyd Date of Birth: [birth year]   [facility] CM/SW Contact:    Bethena Roys, RN Phone Number: 10/18/2021, 4:17 PM    Transition of Care Department [facility]) has reviewed the patient. Patient presented for Tikosyn Load. Benefits check submitted; Case Manager will follow for cost and pharmacy of choice.

## 2021-10-18 NOTE — Telephone Encounter (Signed)
Done. Prescription faxed.

## 2021-10-18 NOTE — Progress Notes (Signed)
Pharmacy: Dofetilide (Tikosyn) - Initial Consult Assessment and Electrolyte Replacement  Pharmacy consulted to assist in monitoring and replacing electrolytes in this 81 y.o. female admitted on 10/18/2021 undergoing dofetilide initiation. First dofetilide dose: 10/18/21.   Assessment:  Patient Exclusion Criteria: If any screening criteria checked as "Yes", then  patient  should NOT receive dofetilide until criteria item is corrected.  If "Yes" please indicate correction plan.  YES  NO Patient  Exclusion Criteria Correction Plan   '[]'$   '[x]'$   Baseline QTc interval is greater than or equal to 440 msec. IF above YES box checked dofetilide contraindicated unless patient has ICD; then may proceed if QTc 500-550 msec or with known ventricular conduction abnormalities may proceed with QTc 550-600 msec. QTc = 438m    '[]'$   '[x]'$   Patient is known or suspected to have a digoxin level greater than 2 ng/ml: No results found for: DIGOXIN     '[]'$   '[x]'$   Creatinine clearance less than 20 ml/min (calculated using Cockcroft-Gault, actual body weight and serum creatinine): Estimated Creatinine Clearance: 53.4 mL/min (by C-G formula based on SCr of 1 mg/dL).     '[]'$   '[x]'$  Patient has received drugs known to prolong the QT intervals within the last 48 hours (phenothiazines, tricyclics or tetracyclic antidepressants, erythromycin, H-1 antihistamines, cisapride, fluoroquinolones, azithromycin, ondansetron).   Updated information on QT prolonging agents is available to be searched on the following database:QT prolonging agents     '[]'$   '[x]'$   Patient received a dose of hydrochlorothiazide (Oretic) alone or in any combination including triamterene (Dyazide, Maxzide) in the last 48 hours.    '[]'$   '[x]'$  Patient received a medication known to increase dofetilide plasma concentrations prior to initial dofetilide dose:  Trimethoprim (Primsol, Proloprim) in the last 36 hours Verapamil (Calan, Verelan) in the last 36 hours  or a sustained release dose in the last 72 hours Megestrol (Megace) in the last 5 days  Cimetidine (Tagamet) in the last 6 hours Ketoconazole (Nizoral) in the last 24 hours Itraconazole (Sporanox) in the last 48 hours  Prochlorperazine (Compazine) in the last 36 hours     '[]'$   '[x]'$   Patient is known to have a history of torsades de pointes; congenital or acquired long QT syndromes.    '[]'$   '[x]'$   Patient has received a Class 1 antiarrhythmic with less than 2 half-lives since last dose. (Disopyramide, Quinidine, Procainamide, Lidocaine, Mexiletine, Flecainide, Propafenone)    '[]'$   '[x]'$   Patient has received amiodarone therapy in the past 3 months or amiodarone level is greater than 0.3 ng/ml.    Patient has been appropriately anticoagulated with apixaban.  Labs:    Component Value Date/Time   K 3.6 10/18/2021 1154   MG 2.1 10/18/2021 1154     Plan: Potassium: K 3.5-3.7:  Hold Tikosyn initiation and give KCl 60 mEq po x1 and repeat BMET 2hr after dose - repeat appropriate dose if K < 4    Magnesium: Mg >2: Appropriate to initiate Tikosyn, no replacement needed    Benefits check shows a copay of $100, patient states her pharmacy gave her a quote of $80 without using insurance. I gave her options of looking into [email or web address] and goodrx.com for better priced options.   Thank you for allowing pharmacy to participate in this patient's care   FErin HearingPharmD., BCPS Clinical Pharmacist 10/18/2021 2:54 PM

## 2021-10-18 NOTE — TOC Benefit Eligibility Note (Signed)
Patient Teacher, English as a foreign language completed.    The patient is currently admitted and upon discharge could be taking dofetilide (Tikosyn) 500 mcg capsules.  The current 30 day co-pay is, $100.00.   The patient is insured through [place] Patient Advocate Specialist [place] Patient Advocate Team Direct Number: [phone]  Fax: [phone]

## 2021-10-18 NOTE — Progress Notes (Signed)
   10/18/21 1429  Assess: MEWS Score  Temp 98.2 F (36.8 C)  BP 114/72  MAP (mmHg) 82  Pulse Rate 70  ECG Heart Rate (!) 115  Resp 15  Level of Consciousness Alert  SpO2 93 %  O2 Device Room Air  Assess: MEWS Score  MEWS Temp 0  MEWS Systolic 0  MEWS Pulse 2  MEWS RR 0  MEWS LOC 0  MEWS Score 2  MEWS Score Color Yellow  Assess: if the MEWS score is Yellow or Red  Were vital signs taken at a resting state? Yes  Focused Assessment No change from prior assessment  Does the patient meet 2 or more of the SIRS criteria? No  MEWS guidelines implemented *See Row Information* Yes  Treat  Pain Scale 0-10  Pain Score 0  Take Vital Signs  Increase Vital Sign Frequency  Yellow: Q 2hr X 2 then Q 4hr X 2, if remains yellow, continue Q 4hrs  Escalate  MEWS: Escalate Yellow: discuss with charge nurse/RN and consider discussing with provider and RRT  Notify: Charge Nurse/RN  Name of Charge Nurse/RN Notified Yoko, RN  Date Charge Nurse/RN Notified 10/18/21  Time Charge Nurse/RN Notified 1433  Document  Patient Outcome Other (Comment) (stable in room)  Progress note created (see row info) Yes  Assess: SIRS CRITERIA  SIRS Temperature  0  SIRS Pulse 1  SIRS Respirations  0  SIRS WBC 0  SIRS Score Sum  1

## 2021-10-18 NOTE — Progress Notes (Signed)
Primary Care Physician: [name], MD Primary Cardiologist: Dr Rockey Situ  Primary Electrophysiologist: Dr Quentin Ore Referring Physician: Dr Otelia Limes is a 81 y.o. female with a history of OSA, carotid artery disease, chronic diastolic CHF, emphysema, atrial fibrillation who presents for follow up in the [facility].  The patient was initially diagnosed with atrial fibrillation in 2019. She was started on amiodarone. She had DCCV 02/18/21 but was back in afib by follow up. She was seen by Dr Quentin Ore who recommended dofetilide after amiodarone washout. Patient is on Eliquis for a CHADS2VASC score of 6.  On follow up today, patient presents for dofetilide admission. She denies any missed doses of anticoagulation in the past 3 weeks. She continues to have symptoms of fatigue despite good rate control.   Today, she denies symptoms of palpitations, chest pain, shortness of breath, orthopnea, PND, lower extremity edema, dizziness, presyncope, syncope, snoring, daytime somnolence, bleeding, or neurologic sequela. The patient is tolerating medications without difficulties and is otherwise without complaint today.    Atrial Fibrillation Risk Factors:  she does have symptoms or diagnosis of sleep apnea. she is not compliant with CPAP therapy. she does not have a history of rheumatic fever.   she has a BMI of Body mass index is 34.33 kg/m.[name] Kitchen Filed Weights   10/18/21 1115  Weight: 99.4 kg    Family History  Problem Relation Age of Onset   Hyperlipidemia Sister    Atrial fibrillation Sister    Transient ischemic attack Mother    Heart disease Mother    Heart attack Father    High blood pressure Father    Stroke Father    Healthy Brother    Breast cancer Sister 73   Hyperlipidemia Sister    Atrial fibrillation Sister      Atrial Fibrillation Management history:  Previous antiarrhythmic drugs: amiodarone  Previous cardioversions: 2020,  02/18/21 Previous ablations: none CHADS2VASC score: 6 Anticoagulation history: Eliquis   Past Medical History:  Diagnosis Date   (HFpEF) heart failure with preserved ejection fraction ([place])    a. 05/2018 Echo: EF 55-60%, no rwma, mild to mod MR. Nl RV fxn. Mod TR. PASP 30mHg.   Arthritis    knees, Hands   Arthritis of knee    Back pain    Carotid arterial disease ([place]    a. 03/2019 Carotid U/S: <50% bilat ICA stenoses.   Cholelithiasis    a. 10/2018 noted on CT.   Edema, lower extremity    Fatty liver    GERD (gastroesophageal reflux disease)    History of stress test    a. 06/2018 MV: EF 59%, no ischemia/infarct. Low risk.   Knee pain    Lactose intolerance    Mitral regurgitation    a. 05/2018 Echo: mild to mod MR.   Multinodular goiter    Obesity    OSA (obstructive sleep apnea)    PAF (paroxysmal atrial fibrillation) ([facility]    a.  Diagnosed 12/19; b. 05/2018 s/p DCCV; c. 03/2019 & 05/2019 recurrent AFib-->managed w/ amio load; d. CHADS2VASc = 6 (CHF, HTN, age x 2, vascular disease, female)-->Eliquis & amio 100 qd.   PAH (pulmonary artery hypertension) (HCC)    Scoliosis    SOB (shortness of breath)    Swallowing difficulty    Past Surgical History:  Procedure Laterality Date   CARDIOVERSION N/A 06/15/2018   Procedure: CARDIOVERSION (CATH LAB);  Surgeon: GMinna Merritts MD;  Location: [facility];  Service: Cardiovascular;  Laterality: N/A;   CARDIOVERSION N/A 02/18/2021   Procedure: CARDIOVERSION;  Surgeon: Minna Merritts, MD;  Location: [facility];  Service: Cardiovascular;  Laterality: N/A;   CATARACT EXTRACTION W/PHACO Right 01/25/2016   Procedure: CATARACT EXTRACTION PHACO AND INTRAOCULAR LENS PLACEMENT ([place]);  Surgeon: Ronnell Freshwater, MD;  Location: [place];  Service: Ophthalmology;  Laterality: Right;  RIGHT   CATARACT EXTRACTION W/PHACO Left 02/22/2016   Procedure: CATARACT EXTRACTION PHACO AND INTRAOCULAR LENS PLACEMENT ([place]);  Surgeon: Ronnell Freshwater, MD;  Location: [place];  Service: Ophthalmology;  Laterality: Left;  LEFT   HAMMER TOE SURGERY  05/16/2008   KNEE ARTHROSCOPY Right 05/16/2002   REPLACEMENT TOTAL KNEE Right 05/17/2007   [facility]   SKIN GRAFT Left 04/08/2013   Done on left index finger   TONSILLECTOMY  05/16/1944   TOTAL KNEE ARTHROPLASTY Left 12/02/2020    Current Outpatient Medications  Medication Sig Dispense Refill   acetaminophen (TYLENOL) 325 MG tablet Take 650 mg by mouth every 6 (six) hours as needed.     amoxicillin (AMOXIL) 500 MG capsule Take 4 capsules by mouth 1 hour prior to dental visit.     calcium carbonate (TUMS EX) 750 MG chewable tablet Chew 2 tablets by mouth daily as needed for heartburn.     Cholecalciferol 25 MCG (1000 UT) tablet Take 1,000 Units by mouth daily.     ELIQUIS 5 MG TABS tablet TAKE ONE TABLET BY MOUTH TWICE DAILY 180 tablet 1   ezetimibe (ZETIA) 10 MG tablet Take 1 tablet (10 mg total) by mouth daily. 90 tablet 3   furosemide (LASIX) 40 MG tablet Take 1 tablet (40 mg total) by mouth daily. Take extra 40 mg as needed after lunch for abdominal swelling, leg swelling, or shortness of breath 180 tablet 0   metaxalone (SKELAXIN) 800 MG tablet Take 800 mg by mouth daily as needed for muscle spasms.     Multiple Vitamin (MULTIVITAMIN) capsule Take 1 capsule by mouth daily.     potassium chloride (KLOR-CON) 10 MEQ tablet TAKE 1 TABLET BY MOUTH DAILY 30 tablet 0   Tiotropium Bromide Monohydrate (SPIRIVA RESPIMAT) 2.5 MCG/ACT AERS Inhale 2 puffs into the lungs daily. 4 g 1   bisoprolol (ZEBETA) 5 MG tablet Take 1 tablet (5 mg total) by mouth 2 (two) times daily.     No current facility-administered medications for this encounter.    Allergies  Allergen Reactions   Levofloxacin     Other reaction(s): Joint Pains    Influenza Vaccines Other (See Comments)    Bell's Palsy   Oysters [Shellfish Allergy] Swelling    She states she had  eaten them three days in a row and she developed swelling around her eyes.     Social History   Socioeconomic History   Marital status: Single    Spouse name: Not on file   Number of children: 5   Years of education: college   Highest education level: Bachelor's degree (e.g., BA, AB, BS)  Occupational History   Occupation: Retail banker: [employer]  Tobacco Use   Smoking status: Former    Packs/day: 1.00    Years: 30.00    Pack years: 30.00    Types: Cigarettes    Quit date: 05/16/1989    Years since quitting: 32.4   Smokeless tobacco: Never  Vaping Use   Vaping Use: Never used  Substance and Sexual Activity  Alcohol use: Yes    Alcohol/week: 7.0 standard drinks    Types: 7 Glasses of wine per week    Comment: 0-2 a night   Drug use: No   Sexual activity: Not Currently  Other Topics Concern   Not on file  Social History Narrative   Pt has a child who passed away at age 35   Social Determinants of Health   Financial Resource Strain: Low Risk    Difficulty of Paying Living Expenses: Not hard at all  Food Insecurity: Not on file  Transportation Needs: Not on file  Physical Activity: Not on file  Stress: Not on file  Social Connections: Not on file  Intimate Partner Violence: Not on file     ROS- All systems are reviewed and negative except as per the HPI above.  Physical Exam: Vitals:   10/18/21 1115  BP: 140/70  Pulse: 80  Weight: 99.4 kg  Height: '5\' 7"'$  (1.702 m)    GEN- The patient is a well appearing obese elderly female, alert and oriented x 3 today.   Head- normocephalic, atraumatic Eyes-  Sclera clear, conjunctiva pink Ears- hearing intact Oropharynx- clear Neck- supple  Lungs- Clear to ausculation bilaterally, normal work of breathing Heart- irregular rate and rhythm, no murmurs, rubs or gallops  GI- soft, NT, ND, + BS Extremities- no clubbing, cyanosis, or edema MS- no significant deformity or atrophy Skin- no rash  or lesion Psych- euthymic mood, full affect Neuro- strength and sensation are intact  Wt Readings from Last 3 Encounters:  10/18/21 99.4 kg  08/31/21 97.5 kg  08/16/21 99.5 kg    EKG today demonstrates  Afib Vent. rate 80 BPM PR interval * ms QRS duration 84 ms QT/QTcB 378/435 ms  Echo 04/16/21 demonstrated   1. Left ventricular ejection fraction, by estimation, is 60 to 65%. The  left ventricle has normal function. The left ventricle has no regional  wall motion abnormalities. There is mild left ventricular hypertrophy.  Left ventricular diastolic parameters are indeterminate.   2. Right ventricular systolic function is mildly reduced. The right  ventricular size is mildly enlarged. There is moderately elevated  pulmonary artery systolic pressure. The estimated right ventricular  systolic pressure is 16.1 mmHg.   3. Left atrial size was moderately dilated.   4. Right atrial size was severely dilated.   5. The mitral valve is normal in structure. Moderate mitral valve  regurgitation.   6. Tricuspid valve regurgitation is severe.   7. The inferior vena cava is normal in size with <50% respiratory  variability, suggesting right atrial pressure of 8 mmHg.   Epic records are reviewed at length today  CHA2DS2-VASc Score = 6  [name] score is based upon: CHF History: 1 HTN History: 1 Diabetes History: 0 Stroke History: 0 Vascular Disease History: 1 Age Score: 2 Gender Score: 1       ASSESSMENT AND PLAN: 1. Persistent Atrial Fibrillation (ICD10:  I48.19) [name] CHA2DS2-VASc score is 6, indicating a 9.7% annual risk of stroke.   Patient presents for dofetilide admission.  Continue Eliquis 5 mg BID, states no missed doses in the last 3 weeks. No recent benadryl use PharmD has screened medications, off Imodium.  QTc in SR 411 ms Labs today show creatinine at 1.0, K+ 3.6 and mag 2.1, CrCl calculated at 69 mL/min Continue bisoprolol 7.5 mg BID  2. Secondary  Hypercoagulable State (ICD10:  D68.69) The patient is at significant risk for stroke/thromboembolism based upon her CHA2DS2-VASc  Score of 6.  Continue Apixaban (Eliquis).   3. Obesity Body mass index is 34.33 kg/m. Lifestyle modification was discussed at length including regular exercise and weight reduction.  4. Obstructive sleep apnea Patient did not tolerate CPAP.  5. HTN Stable, no changes today.  6. Chronic HFpEF Appears euvolemic today.   To be admitted later today once a bed becomes available.    [facility] [address] [place], [place] [address] [phone] 10/18/2021 11:44 AM

## 2021-10-18 NOTE — Telephone Encounter (Signed)
Ok. Can we find her settings and last sleep study and then we can send an Rx (there is that form to check off supplies and settings) and I'll sign. Thanks!

## 2021-10-19 ENCOUNTER — Other Ambulatory Visit (HOSPITAL_COMMUNITY): Payer: Self-pay

## 2021-10-19 DIAGNOSIS — I4819 Other persistent atrial fibrillation: Secondary | ICD-10-CM | POA: Diagnosis not present

## 2021-10-19 LAB — BASIC METABOLIC PANEL
Anion gap: 8 (ref 5–15)
BUN: 29 mg/dL — ABNORMAL HIGH (ref 8–23)
CO2: 22 mmol/L (ref 22–32)
Calcium: 9 mg/dL (ref 8.9–10.3)
Chloride: 111 mmol/L (ref 98–111)
Creatinine, Ser: 0.97 mg/dL (ref 0.44–1.00)
GFR, Estimated: 59 mL/min — ABNORMAL LOW (ref >60)
Glucose, Bld: 91 mg/dL (ref 70–99)
Potassium: 4.2 mmol/L (ref 3.5–5.1)
Sodium: 141 mmol/L (ref 135–145)

## 2021-10-19 LAB — MAGNESIUM: Magnesium: 2.2 mg/dL (ref 1.7–2.4)

## 2021-10-19 MED ORDER — FUROSEMIDE 40 MG PO TABS
40.0000 mg | ORAL_TABLET | Freq: Two times a day (BID) | ORAL | Status: AC
  Administered 2021-10-19 (×2): 40 mg via ORAL
  Filled 2021-10-19 (×2): qty 1

## 2021-10-19 MED ORDER — POTASSIUM CHLORIDE CRYS ER 20 MEQ PO TBCR
20.0000 meq | EXTENDED_RELEASE_TABLET | Freq: Once | ORAL | Status: AC
  Administered 2021-10-19: 20 meq via ORAL
  Filled 2021-10-19: qty 1

## 2021-10-19 NOTE — Progress Notes (Addendum)
Called about patients rhythm RN observed marked bradycardia, post termination pausing, immediately went to check on the pt who was in her chair, she reported feeling very lightheaded and near passing out for a few seconds.  AFib > 5.7 second sinus pause > 3.9 seconds > marked SB w/ some junctional rhythm > SB 40's Patient is feeling well again  Currently SB 40's EKG is SB 47bpm, 1st degree AVblock 238m, QTc 442m OK to continue Tikosyn, holding her bisoprolol.  ReTommye StandardPA-C    Patient is seen In bed, feeling well BP is good. No CP, SOB. She reports that in the past she has had similar weak spells, lightheaded episodes, though some time ago and before she has Afib (at least that she knew of) She has never fainted  Discussed what we saw/what happened, and that we may need t consider a PPM as part of her therapy, she is hoping to avoid  ReTommye StandardPA-C

## 2021-10-19 NOTE — Progress Notes (Signed)
Pt state that you had quit taking zetia at home due to it making her head itch really bad. She does not want to take here either. Carroll Kinds RN

## 2021-10-19 NOTE — Progress Notes (Signed)
Pharmacy: Dofetilide (Tikosyn) - Follow Up Assessment and Electrolyte Replacement  Pharmacy consulted to assist in monitoring and replacing electrolytes in this 81 y.o. female admitted on 10/18/2021 undergoing dofetilide initiation. First dofetilide dose: 10/18/21  Labs:    Component Value Date/Time   K 4.2 10/19/2021 0330   MG 2.2 10/19/2021 0330     Plan: Potassium: K >/= 4: No additional supplementation needed  Magnesium: Mg > 2: No additional supplementation needed  Arrie Senate, PharmD, BCPS, [facility] Clinical Pharmacist [phone] Please check AMION for all [facility] Pharmacy numbers 10/19/2021

## 2021-10-19 NOTE — Progress Notes (Signed)
Pt HR dropped into 30's. Stated she felt like she was going to pass out. Pt placed back in bed. Pt back in SB. EKG in chart. Made Georgeanna Harrison PA aware. New orders placed. Carroll Kinds RN

## 2021-10-19 NOTE — Progress Notes (Addendum)
Progress Note  Patient Name: Lauren Lloyd Date of Encounter: 10/19/2021  [facility] Cardiologist: Ida Rogue, MD   Subjective   No complaints, SOB at her baseline  Inpatient Medications    Scheduled Meds:  apixaban  5 mg Oral BID   bisoprolol  5 mg Oral BID   cholecalciferol  1,000 Units Oral Daily   dofetilide  500 mcg Oral BID   ezetimibe  10 mg Oral Daily   furosemide  40 mg Oral Daily   potassium chloride  10 mEq Oral Daily   sodium chloride flush  3 mL Intravenous Q12H   umeclidinium bromide  1 puff Inhalation Daily   Continuous Infusions:  sodium chloride     PRN Meds: sodium chloride, acetaminophen, metaxalone, sodium chloride flush   Vital Signs    Vitals:   10/18/21 1429 10/18/21 2011 10/18/21 2354 10/19/21 0622  BP: 114/72 138/76 (!) 147/99 (!) 144/88  Pulse: 70 (!) 51 89 91  Resp: '15 16 16 17  '$ Temp: 98.2 F (36.8 C) 98.3 F (36.8 C) 97.9 F (36.6 C) 97.8 F (36.6 C)  TempSrc: Oral Oral Oral Oral  SpO2: 93% 96% 97% 97%   No intake or output data in the 24 hours ending 10/19/21 0727    10/18/2021   11:15 AM 08/31/2021    9:46 AM 08/16/2021    8:16 AM  Last 3 Weights  Weight (lbs) 219 lb 3.2 oz 215 lb 219 lb 4.8 oz  Weight (kg) 99.428 kg 97.523 kg 99.474 kg      Telemetry    AFib 80's -100s, rare PVC - Personally Reviewed  ECG    AFib 86bpm, QTc 495, a bit shorter by my measurement 432m - Personally Reviewed  Physical Exam   GEN: No acute distress.   Neck: No JVD Cardiac: irreg-irreg, no murmurs, rubs, or gallops.  Respiratory: soft crackles at the bases. GI: Soft, nontender, non-distended  MS: trace edema; No deformity. Neuro:  Nonfocal  Psych: Normal affect   Labs    High Sensitivity Troponin:  No results for input(s): TROPONINIHS in the last 720 hours.   Chemistry Recent Labs  Lab 10/18/21 1154 10/18/21 1820 10/19/21 0330  NA 140 138 141  K 3.6 4.6 4.2  CL 108 106 111  CO2 '24 22 22  '$ GLUCOSE 106* 116* 91  BUN  28* 27* 29*  CREATININE 1.00 1.00 0.97  CALCIUM 9.3 9.1 9.0  MG 2.1  --  2.2  GFRNONAA 57* 57* 59*  ANIONGAP '8 10 8    '$ Lipids No results for input(s): CHOL, TRIG, HDL, LABVLDL, LDLCALC, CHOLHDL in the last 168 hours.  HematologyNo results for input(s): WBC, RBC, HGB, HCT, MCV, MCH, MCHC, RDW, PLT in the last 168 hours. Thyroid No results for input(s): TSH, FREET4 in the last 168 hours.  BNPNo results for input(s): BNP, PROBNP in the last 168 hours.  DDimer No results for input(s): DDIMER in the last 168 hours.   Radiology    No results found.  Cardiac Studies    04/16/21: TTE  1. Left ventricular ejection fraction, by estimation, is 60 to 65%. The  left ventricle has normal function. The left ventricle has no regional  wall motion abnormalities. There is mild left ventricular hypertrophy.  Left ventricular diastolic parameters  are indeterminate.   2. Right ventricular systolic function is mildly reduced. The right  ventricular size is mildly enlarged. There is moderately elevated  pulmonary artery systolic pressure. The estimated right ventricular  systolic pressure is 81.2 mmHg.   3. Left atrial size was moderately dilated.   4. Right atrial size was severely dilated.   5. The mitral valve is normal in structure. Moderate mitral valve  regurgitation.   6. Tricuspid valve regurgitation is severe.   7. The inferior vena cava is normal in size with <50% respiratory  variability, suggesting right atrial pressure of 8 mmHg.   Patient Profile     81 y.o. female emphysema, OSA (intolerant of CPAP), obesity, chronic CHF (diastolic), HTN, AFib admitted for Tikosyn load.  Assessment & Plan    Persistent AFib CHA2DS2Vasc is 5, on Eliquis Tikosyn load is in progress K+ 4.2 Mag 2.2 Creat 0.97 (stable) QTc stable  DCCV tomorrow if not on SR, pt agreeable   Chronic CHF (diastolic) She has some soft crackles at the bases, on daily lasix, will give BID today  HTN Home  meds  4.  Untreated apnea She is interseted in re-visiting therapy if able to tolerate new mask styles. I have reached out to our sleep team to start that process for her   For questions or updates, please contact [place] Please consult [email or web address] for contact info under        Signed, Baldwin Jamaica, PA-C  10/19/2021, 7:27 AM

## 2021-10-20 ENCOUNTER — Encounter (HOSPITAL_COMMUNITY)
Admission: AD | Disposition: A | Discharge status: Home / Self Care | Payer: Self-pay | Source: Ambulatory Visit | Attending: Cardiology

## 2021-10-20 DIAGNOSIS — I4819 Other persistent atrial fibrillation: Secondary | ICD-10-CM | POA: Diagnosis not present

## 2021-10-20 LAB — BASIC METABOLIC PANEL
Anion gap: 9 (ref 5–15)
BUN: 28 mg/dL — ABNORMAL HIGH (ref 8–23)
CO2: 24 mmol/L (ref 22–32)
Calcium: 8.8 mg/dL — ABNORMAL LOW (ref 8.9–10.3)
Chloride: 105 mmol/L (ref 98–111)
Creatinine, Ser: 1.28 mg/dL — ABNORMAL HIGH (ref 0.44–1.00)
GFR, Estimated: 42 mL/min — ABNORMAL LOW (ref >60)
Glucose, Bld: 101 mg/dL — ABNORMAL HIGH (ref 70–99)
Potassium: 3.9 mmol/L (ref 3.5–5.1)
Sodium: 138 mmol/L (ref 135–145)

## 2021-10-20 LAB — MAGNESIUM: Magnesium: 2 mg/dL (ref 1.7–2.4)

## 2021-10-20 SURGERY — CARDIOVERSION
Anesthesia: General

## 2021-10-20 MED ORDER — POTASSIUM CHLORIDE CRYS ER 20 MEQ PO TBCR
20.0000 meq | EXTENDED_RELEASE_TABLET | Freq: Once | ORAL | Status: AC
  Administered 2021-10-20: 20 meq via ORAL
  Filled 2021-10-20: qty 1

## 2021-10-20 MED ORDER — MAGNESIUM SULFATE 2 GM/50ML IV SOLN
2.0000 g | Freq: Once | INTRAVENOUS | Status: AC
  Administered 2021-10-20: 2 g via INTRAVENOUS
  Filled 2021-10-20: qty 50

## 2021-10-20 NOTE — Progress Notes (Addendum)
Pharmacy: Dofetilide (Tikosyn) - Follow Up Assessment and Electrolyte Replacement  Pharmacy consulted to assist in monitoring and replacing electrolytes in this 81 y.o. female admitted on 10/18/2021 undergoing dofetilide initiation. First dofetilide dose: 10/18/21  Labs:    Component Value Date/Time   K 3.9 10/20/2021 0338   MG 2.0 10/20/2021 0338     Plan: Potassium: K 3.8-3.9:  Give KCl 40 mEq po x1  >> 20 mEq x1 per EP  Magnesium: Mg 1.8-2: Give Mg 2 gm IV x1   Arrie Senate, PharmD, [place], [facility] Clinical Pharmacist [phone] Please check AMION for all [facility] Pharm[facility] numbers 10/20/2021

## 2021-10-20 NOTE — Progress Notes (Signed)
Mobility Specialist Progress Note    10/20/21 1242  Mobility  Activity Ambulated independently in hallway  Level of Assistance Independent  Assistive Device None  Distance Ambulated (ft) 450 ft  Activity Response Tolerated well  $Mobility charge 1 Mobility   During Mobility: 85 HR Post-Mobility: 62 HR  Pt received in chair and agreeable. Took x1 short standing rest break c/o some SOB with exertion. Returned to chair with call bell in reach.    Hildred Alamin Mobility Specialist

## 2021-10-20 NOTE — Progress Notes (Addendum)
Progress Note  Patient Name: Lauren Lloyd Date of Encounter: 10/20/2021  [facility] Cardiologist: Ida Rogue, MD   Subjective   No complaints, SOB at her baseline  Inpatient Medications    Scheduled Meds:  apixaban  5 mg Oral BID   cholecalciferol  1,000 Units Oral Daily   dofetilide  500 mcg Oral BID   ezetimibe  10 mg Oral Daily   potassium chloride  10 mEq Oral Daily   sodium chloride flush  3 mL Intravenous Q12H   umeclidinium bromide  1 puff Inhalation Daily   Continuous Infusions:  sodium chloride     magnesium sulfate bolus IVPB 2 g (10/20/21 0807)   PRN Meds: sodium chloride, acetaminophen, metaxalone, sodium chloride flush   Vital Signs    Vitals:   10/19/21 1527 10/19/21 2106 10/20/21 0619 10/20/21 0833  BP: 132/75 (!) 119/53 (!) 147/87   Pulse: (!) 44 (!) 52 (!) 52 (!) 49  Resp: '16 16 16 18  '$ Temp: 98.1 F (36.7 C) 98.2 F (36.8 C) 97.9 F (36.6 C)   TempSrc: Oral Oral Oral   SpO2: 95% 99% 98% 95%   No intake or output data in the 24 hours ending 10/20/21 0902    10/18/2021   11:15 AM 08/31/2021    9:46 AM 08/16/2021    8:16 AM  Last 3 Weights  Weight (lbs) 219 lb 3.2 oz 215 lb 219 lb 4.8 oz  Weight (kg) 99.428 kg 97.523 kg 99.474 kg      Telemetry    SB 50's, dips high 40's yesterday and evening, occassionally she has PVCs, brief bigeminy - Personally Reviewed  ECG    SB 51bpm, QTc 433m- Personally Reviewed  Physical Exam   GEN: No acute distress.   Neck: No JVD Cardiac: RRR, no murmurs, rubs, or gallops.  Respiratory: CTA b/l. GI: Soft, nontender, non-distended  MS: trace edema; No deformity. Neuro:  Nonfocal  Psych: Normal affect   Labs    High Sensitivity Troponin:  No results for input(s): TROPONINIHS in the last 720 hours.   Chemistry Recent Labs  Lab 10/18/21 1154 10/18/21 1820 10/19/21 0330 10/20/21 0338  NA 140 138 141 138  K 3.6 4.6 4.2 3.9  CL 108 106 111 105  CO2 '24 22 22 24  '$ GLUCOSE 106* 116* 91 101*   BUN 28* 27* 29* 28*  CREATININE 1.00 1.00 0.97 1.28*  CALCIUM 9.3 9.1 9.0 8.8*  MG 2.1  --  2.2 2.0  GFRNONAA 57* 57* 59* 42*  ANIONGAP '8 10 8 9    '$ Lipids No results for input(s): CHOL, TRIG, HDL, LABVLDL, LDLCALC, CHOLHDL in the last 168 hours.  HematologyNo results for input(s): WBC, RBC, HGB, HCT, MCV, MCH, MCHC, RDW, PLT in the last 168 hours. Thyroid No results for input(s): TSH, FREET4 in the last 168 hours.  BNPNo results for input(s): BNP, PROBNP in the last 168 hours.  DDimer No results for input(s): DDIMER in the last 168 hours.   Radiology    No results found.  Cardiac Studies    04/16/21: TTE  1. Left ventricular ejection fraction, by estimation, is 60 to 65%. The  left ventricle has normal function. The left ventricle has no regional  wall motion abnormalities. There is mild left ventricular hypertrophy.  Left ventricular diastolic parameters  are indeterminate.   2. Right ventricular systolic function is mildly reduced. The right  ventricular size is mildly enlarged. There is moderately elevated  pulmonary artery systolic  pressure. The estimated right ventricular  systolic pressure is 62.1 mmHg.   3. Left atrial size was moderately dilated.   4. Right atrial size was severely dilated.   5. The mitral valve is normal in structure. Moderate mitral valve  regurgitation.   6. Tricuspid valve regurgitation is severe.   7. The inferior vena cava is normal in size with <50% respiratory  variability, suggesting right atrial pressure of 8 mmHg.   Patient Profile     81 y.o. female emphysema, OSA (intolerant of CPAP), obesity, chronic CHF (diastolic), HTN, AFib admitted for Tikosyn load.  Assessment & Plan    Persistent AFib CHA2DS2Vasc is 5, on Eliquis Tikosyn load is in progress K+ 3.9 replaced Mag 2.2 Creat 1.28 (calac crcl is 54, no change to dose, follow, no lasix today) QTc stable  Converted yesterday with long post-termination pause and bradycardia,  symptomatic with near syncope Keep off her BB Remains SB 50's    Chronic CHF (diastolic) Lungs are clear Creat up after BID lasix (and ? Bradycardic event) Hold lasix today SR should help her volume  HTN Home meds  4.  Untreated apnea She is interseted in re-visiting therapy if able to tolerate new mask styles. I have reached out to our sleep team to start that process for her   For questions or updates, please contact [place] Please consult [email or web address] for contact info under        Signed, Baldwin Jamaica, PA-C  10/20/2021, 9:02 AM

## 2021-10-20 NOTE — Care Management (Signed)
1254 10-20-21 Patient presented for Tikosyn Load. Case Manager discussed cost with the patient and she feels that the co pay is expensive via insurance. We discussed Good Rx and the [employer] (Cost Plus). Pharmacy to assist with the Cost Plus to see if they can get the patient enrolled and how to submit the Rx refills. Patient wants the initial Rx filled via [place]. Case Manager will continue to follow for additional transition of care needs.

## 2021-10-21 ENCOUNTER — Encounter (HOSPITAL_COMMUNITY): Payer: Self-pay | Admitting: Anesthesiology

## 2021-10-21 ENCOUNTER — Other Ambulatory Visit: Payer: Self-pay

## 2021-10-21 ENCOUNTER — Encounter (HOSPITAL_COMMUNITY): Payer: Self-pay | Admitting: Internal Medicine

## 2021-10-21 DIAGNOSIS — I4819 Other persistent atrial fibrillation: Secondary | ICD-10-CM | POA: Diagnosis not present

## 2021-10-21 LAB — BASIC METABOLIC PANEL
Anion gap: 7 (ref 5–15)
BUN: 30 mg/dL — ABNORMAL HIGH (ref 8–23)
CO2: 21 mmol/L — ABNORMAL LOW (ref 22–32)
Calcium: 8.8 mg/dL — ABNORMAL LOW (ref 8.9–10.3)
Chloride: 109 mmol/L (ref 98–111)
Creatinine, Ser: 1.24 mg/dL — ABNORMAL HIGH (ref 0.44–1.00)
GFR, Estimated: 44 mL/min — ABNORMAL LOW (ref >60)
Glucose, Bld: 109 mg/dL — ABNORMAL HIGH (ref 70–99)
Potassium: 4 mmol/L (ref 3.5–5.1)
Sodium: 137 mmol/L (ref 135–145)

## 2021-10-21 LAB — MAGNESIUM: Magnesium: 2.2 mg/dL (ref 1.7–2.4)

## 2021-10-21 MED ORDER — CHLORHEXIDINE GLUCONATE CLOTH 2 % EX PADS
6.0000 | MEDICATED_PAD | Freq: Every day | CUTANEOUS | Status: DC
  Administered 2021-10-21: 6 via TOPICAL

## 2021-10-21 MED ORDER — SODIUM CHLORIDE 0.9% FLUSH
3.0000 mL | Freq: Two times a day (BID) | INTRAVENOUS | Status: DC
  Administered 2021-10-22 – 2021-10-25 (×7): 3 mL via INTRAVENOUS

## 2021-10-21 MED ORDER — MAGNESIUM SULFATE 2 GM/50ML IV SOLN: 2.0000 g | Freq: Once | INTRAVENOUS | Status: AC

## 2021-10-21 MED ORDER — MAGNESIUM SULFATE 2 GM/50ML IV SOLN
2.0000 g | Freq: Once | INTRAVENOUS | Status: AC
  Administered 2021-10-21: 2 g via INTRAVENOUS

## 2021-10-21 MED ORDER — MAGNESIUM SULFATE 2 GM/50ML IV SOLN
INTRAVENOUS | Status: AC
  Administered 2021-10-21: 2 g via INTRAVENOUS
  Filled 2021-10-21: qty 50

## 2021-10-21 NOTE — Progress Notes (Addendum)
Was aware that earlier this AM the pt converted back to SR with a 6 second post termonation pause that was asymptomatic. Tele reviewed at that time SB 50's without significant ectopy Called by RN for increasing PVC burden and episode NSVT. With the NSVT pt was symptomatic with lightheadedness. Telemetry reviewed Sinus w/V bigeminy, and an episode of PMVT (approx 6 seconds)  She remains in V bigeminy Mag ordered Tikosyn d/c'd  D/w Dr. Quentin Ore Transferring to [phone] once bed is available  Spoke to the patient  Tommye Standard, [facility] Dr. Quentin Ore to bedside.   ADDEND: She has failed amiodarone unable to maintain SR. She has now failed Tikosyn. Dr. Quentin Ore suspects that her atria are significantly scarred and unlikely to achieve durable success w/ablation. She has shown to have tachy-brady and plan will be to pace her on Monday and resume rate controlling meds afterwards.  Given chemical cardioversion, I don't think we will be able to interrupt her [place], we will discuss this further.  Tommye Standard, PA-C

## 2021-10-21 NOTE — Progress Notes (Signed)
Pt had 6.16s pause on the telemetry at 0936. PA Tommye Standard notified on the floor. Pt asymptomatic.

## 2021-10-21 NOTE — Progress Notes (Signed)
Progress Note  Patient Name: Lauren Lloyd Date of Encounter: 10/21/2021  [facility] Cardiologist: Ida Rogue, MD   Subjective   No complaints, SOB remains at her baseline  Inpatient Medications    Scheduled Meds:  apixaban  5 mg Oral BID   cholecalciferol  1,000 Units Oral Daily   dofetilide  500 mcg Oral BID   ezetimibe  10 mg Oral Daily   potassium chloride  10 mEq Oral Daily   sodium chloride flush  3 mL Intravenous Q12H   umeclidinium bromide  1 puff Inhalation Daily   Continuous Infusions:  sodium chloride     PRN Meds: sodium chloride, acetaminophen, metaxalone, sodium chloride flush   Vital Signs    Vitals:   10/20/21 1230 10/20/21 1442 10/20/21 2020 10/21/21 0605  BP: (!) 154/61 115/71 (!) 157/71 (!) 153/56  Pulse:  (!) 57 62 (!) 58  Resp: '16 16 18 18  '$ Temp: 98 F (36.7 C) 98.1 F (36.7 C) 97.9 F (36.6 C) 98.1 F (36.7 C)  TempSrc: Oral Oral Oral Oral  SpO2: 98%  98% 99%    Intake/Output Summary (Last 24 hours) at 10/21/2021 0836 Last data filed at 10/20/2021 2139 Gross per 24 hour  Intake 246 ml  Output --  Net 246 ml      10/18/2021   11:15 AM 08/31/2021    9:46 AM 08/16/2021    8:16 AM  Last 3 Weights  Weight (lbs) 219 lb 3.2 oz 215 lb 219 lb 4.8 oz  Weight (kg) 99.428 kg 97.523 kg 99.474 kg      Telemetry    SB 50's, dips high 40's yesterday and evening, occassionally she has PVCs, brief bigeminy - Personally Reviewed  ECG    SB 51bpm, QTc 462m- Personally Reviewed  Physical Exam   GEN: No acute distress.   Neck: No JVD Cardiac: irreg-irreg, no murmurs, rubs, or gallops.  Respiratory: CTA b/l. GI: Soft, nontender, non-distended  MS: trace edema; No deformity. Neuro:  Nonfocal  Psych: Normal affect   Labs    High Sensitivity Troponin:  No results for input(s): "TROPONINIHS" in the last 720 hours.   Chemistry Recent Labs  Lab 10/19/21 0330 10/20/21 0338 10/21/21 0144  NA 141 138 137  K 4.2 3.9 4.0  CL 111 105 109   CO2 22 24 21*  GLUCOSE 91 101* 109*  BUN 29* 28* 30*  CREATININE 0.97 1.28* 1.24*  CALCIUM 9.0 8.8* 8.8*  MG 2.2 2.0 2.2  GFRNONAA 59* 42* 44*  ANIONGAP '8 9 7    '$ Lipids No results for input(s): "CHOL", "TRIG", "HDL", "LABVLDL", "LDLCALC", "CHOLHDL" in the last 168 hours.  HematologyNo results for input(s): "WBC", "RBC", "HGB", "HCT", "MCV", "MCH", "MCHC", "RDW", "PLT" in the last 168 hours. Thyroid No results for input(s): "TSH", "FREET4" in the last 168 hours.  BNPNo results for input(s): "BNP", "PROBNP" in the last 168 hours.  DDimer No results for input(s): "DDIMER" in the last 168 hours.   Radiology    No results found.  Cardiac Studies    04/16/21: TTE  1. Left ventricular ejection fraction, by estimation, is 60 to 65%. The  left ventricle has normal function. The left ventricle has no regional  wall motion abnormalities. There is mild left ventricular hypertrophy.  Left ventricular diastolic parameters  are indeterminate.   2. Right ventricular systolic function is mildly reduced. The right  ventricular size is mildly enlarged. There is moderately elevated  pulmonary artery systolic pressure. The  estimated right ventricular  systolic pressure is 26.9 mmHg.   3. Left atrial size was moderately dilated.   4. Right atrial size was severely dilated.   5. The mitral valve is normal in structure. Moderate mitral valve  regurgitation.   6. Tricuspid valve regurgitation is severe.   7. The inferior vena cava is normal in size with <50% respiratory  variability, suggesting right atrial pressure of 8 mmHg.   Patient Profile     81 y.o. female emphysema, OSA (intolerant of CPAP), obesity, chronic CHF (diastolic), HTN, AFib admitted for Tikosyn load.  Assessment & Plan    Persistent AFib CHA2DS2Vasc is 5, on Eliquis Tikosyn load is in progress K+ 4.0 Mag 2.2 Creat 1.24 (calac crcl is 56, no change to dose, follow, no lasix again today), her baseline is 0.9-1.0  w/clearance in the 60's QTc post dose yesterday was stable, will keep 518mg dose today  Unfortunately she is back in AF, low 100's Will not give her BB, hopefully avoid post termination pausing/brady. She may go back into SR Will hold her NPO for possible DCCV tomorrow if needed She will stay today, she is aware, disappointed by understands.   Chronic CHF (diastolic) Lungs are clear again this AM Creat only slightly better today, remains up from her baseline after BID lasix Tuesday (and ? Bradycardic event) Hold lasix again today   HTN Home meds  4.  Untreated apnea She is interseted in re-visiting therapy if able to tolerate new mask styles. I have reached out to our sleep team to start that process for her   For questions or updates, please [place] [email or web address] for contact info under        Signed, RBaldwin Jamaica PA-C  10/21/2021, 8:36 AM

## 2021-10-21 NOTE — Progress Notes (Addendum)
Pt had roughtly 20 beats vtach on telemetry in an 8s timeframe. Pt felt really cold, denied dizziness or eyes rolling back. Daughter was in room and stated pt looked pale. Pt complained of chest discomfort 2/10. EKG obtained. PA Charlcie Cradle paged, ordered to start 2g magnesium. Defib pads applied.  O2 sat 92%, pt feeling a little SOB. Placed on 2L [place], sating 96-97%.

## 2021-10-21 NOTE — Progress Notes (Signed)
Pharmacy: Dofetilide (Tikosyn) - Follow Up Assessment and Electrolyte Replacement  Pharmacy consulted to assist in monitoring and replacing electrolytes in this 81 y.o. female admitted on 10/18/2021 undergoing dofetilide initiation. First dofetilide dose: 10/18/21  Labs:    Component Value Date/Time   K 4.0 10/21/2021 0144   MG 2.2 10/21/2021 0144     Plan: Potassium: K >/= 4: No additional supplementation needed   Magnesium: Mg > 2: No additional supplementation needed  Pt has required an average of ~30 mEQ KCl daily this admission, recommend increasing home KCl dose to 20 mEq daily (from 10 mEq daily).   Arrie Senate, PharmD, BCPS, [facility] Clinical Pharmacist [phone] Please check AMION for all [place] numbers 10/21/2021

## 2021-10-21 NOTE — Care Management Important Message (Signed)
Important Message  Patient Details  Name: Lauren Lloyd MRN: [id number] Date of Birth: 04-16-1941   Medicare Important Message Given:  Yes     [name] Montine Circle 10/21/2021, 4:49 PM

## 2021-10-22 ENCOUNTER — Encounter (HOSPITAL_COMMUNITY)
Admission: AD | Disposition: A | Discharge status: Home / Self Care | Payer: Self-pay | Source: Ambulatory Visit | Attending: Cardiology

## 2021-10-22 DIAGNOSIS — I4819 Other persistent atrial fibrillation: Secondary | ICD-10-CM | POA: Diagnosis not present

## 2021-10-22 LAB — MAGNESIUM: Magnesium: 2.5 mg/dL — ABNORMAL HIGH (ref 1.7–2.4)

## 2021-10-22 LAB — GLUCOSE, CAPILLARY: Glucose-Capillary: 194 mg/dL — ABNORMAL HIGH (ref 70–99)

## 2021-10-22 LAB — BASIC METABOLIC PANEL
Anion gap: 8 (ref 5–15)
BUN: 17 mg/dL (ref 8–23)
CO2: 24 mmol/L (ref 22–32)
Calcium: 8.6 mg/dL — ABNORMAL LOW (ref 8.9–10.3)
Chloride: 108 mmol/L (ref 98–111)
Creatinine, Ser: 0.85 mg/dL (ref 0.44–1.00)
GFR, Estimated: >60 mL/min (ref >60)
Glucose, Bld: 114 mg/dL — ABNORMAL HIGH (ref 70–99)
Potassium: 3.8 mmol/L (ref 3.5–5.1)
Sodium: 140 mmol/L (ref 135–145)

## 2021-10-22 SURGERY — CARDIOVERSION
Anesthesia: General

## 2021-10-22 MED ORDER — FUROSEMIDE 10 MG/ML IJ SOLN
40.0000 mg | Freq: Every day | INTRAMUSCULAR | Status: DC
  Administered 2021-10-22: 40 mg via INTRAVENOUS
  Filled 2021-10-22: qty 4

## 2021-10-22 MED ORDER — POTASSIUM CHLORIDE CRYS ER 20 MEQ PO TBCR
20.0000 meq | EXTENDED_RELEASE_TABLET | Freq: Once | ORAL | Status: AC
  Administered 2021-10-22: 20 meq via ORAL
  Filled 2021-10-22: qty 1

## 2021-10-22 NOTE — Progress Notes (Signed)
Progress Note  Patient Name: Lauren Lloyd Date of Encounter: 10/22/2021  [facility] Cardiologist: Ida Rogue, MD   Subjective   No complaints, SOB remains at her baseline  Inpatient Medications    Scheduled Meds:  apixaban  5 mg Oral BID   Chlorhexidine Gluconate Cloth  6 each Topical Daily   cholecalciferol  1,000 Units Oral Daily   ezetimibe  10 mg Oral Daily   potassium chloride  10 mEq Oral Daily   sodium chloride flush  3 mL Intravenous Q12H   sodium chloride flush  3 mL Intravenous Q12H   umeclidinium bromide  1 puff Inhalation Daily   Continuous Infusions:  sodium chloride     PRN Meds: sodium chloride, acetaminophen, metaxalone, sodium chloride flush   Vital Signs    Vitals:   10/22/21 0504 10/22/21 0600 10/22/21 0700 10/22/21 0713  BP: (!) 189/95 (!) 180/120 (!) 183/85 117/88  Pulse: 76 81 80 79  Resp: 19 (!) 24 19 (!) 22  Temp:    98.5 F (36.9 C)  TempSrc:    Axillary  SpO2: 93% 92% 93% 90%  Weight:      Height:        Intake/Output Summary (Last 24 hours) at 10/22/2021 0814 Last data filed at 10/21/2021 2000 Gross per 24 hour  Intake 290 ml  Output --  Net 290 ml      10/21/2021    8:09 PM 10/18/2021   11:15 AM 08/31/2021    9:46 AM  Last 3 Weights  Weight (lbs) 219 lb 2.2 oz 219 lb 3.2 oz 215 lb  Weight (kg) 99.4 kg 99.428 kg 97.523 kg      Telemetry    SB 50's, dips high 40's yesterday and evening, occassionally she has PVCs, brief bigeminy - Personally Reviewed  ECG    SB 51bpm, QTc 433m- Personally Reviewed  Physical Exam   GEN: No acute distress.   Neck: No JVD Cardiac: irreg-irreg, no murmurs, rubs, or gallops.  Respiratory: CTA b/l. GI: Soft, nontender, non-distended  MS: trace edema; No deformity. Neuro:  Nonfocal  Psych: Normal affect   Labs    High Sensitivity Troponin:  No results for input(s): "TROPONINIHS" in the last 720 hours.   Chemistry Recent Labs  Lab 10/20/21 0338 10/21/21 0144 10/22/21 0157   NA 138 137 140  K 3.9 4.0 3.8  CL 105 109 108  CO2 24 21* 24  GLUCOSE 101* 109* 114*  BUN 28* 30* 17  CREATININE 1.28* 1.24* 0.85  CALCIUM 8.8* 8.8* 8.6*  MG 2.0 2.2 2.5*  GFRNONAA 42* 44* >60  ANIONGAP '9 7 8    '$ Lipids No results for input(s): "CHOL", "TRIG", "HDL", "LABVLDL", "LDLCALC", "CHOLHDL" in the last 168 hours.  HematologyNo results for input(s): "WBC", "RBC", "HGB", "HCT", "MCV", "MCH", "MCHC", "RDW", "PLT" in the last 168 hours. Thyroid No results for input(s): "TSH", "FREET4" in the last 168 hours.  BNPNo results for input(s): "BNP", "PROBNP" in the last 168 hours.  DDimer No results for input(s): "DDIMER" in the last 168 hours.   Radiology    No results found.  Cardiac Studies    04/16/21: TTE  1. Left ventricular ejection fraction, by estimation, is 60 to 65%. The  left ventricle has normal function. The left ventricle has no regional  wall motion abnormalities. There is mild left ventricular hypertrophy.  Left ventricular diastolic parameters  are indeterminate.   2. Right ventricular systolic function is mildly reduced. The right  ventricular size is mildly enlarged. There is moderately elevated  pulmonary artery systolic pressure. The estimated right ventricular  systolic pressure is 73.7 mmHg.   3. Left atrial size was moderately dilated.   4. Right atrial size was severely dilated.   5. The mitral valve is normal in structure. Moderate mitral valve  regurgitation.   6. Tricuspid valve regurgitation is severe.   7. The inferior vena cava is normal in size with <50% respiratory  variability, suggesting right atrial pressure of 8 mmHg.   Patient Profile     81 y.o. female emphysema, OSA (intolerant of CPAP), obesity, chronic CHF (diastolic), HTN, AFib admitted for Tikosyn load.  Assessment & Plan    Persistent AFib CHA2DS2Vasc is 5, on Eliquis Failed Tikosyn with NSPMVT yesterday Previously failed amio  Observed tachy-brady here, plan for PPM  Monday (orders in place) PVC burden is down, rare couplet, one 3beat salvo To telemetry today  Last dose of her Eliquis Sunday evening with thoughts towards resuming likely Monday evening  ASAP post pacer given chemical cardioversion   Chronic CHF (diastolic) Lungs are clear again this AM Creat back to baseline Resume daily lasix   HTN Will resume her BB post pacing   4.  Untreated apnea She is interseted in re-visiting therapy if able to tolerate new mask styles. I have reached out to our sleep team to start that process for her   For questions or updates, please contact [place] Please consult [email or web address] for contact info under        Signed, Baldwin Jamaica, PA-C  10/22/2021, 8:14 AM

## 2021-10-22 NOTE — Progress Notes (Signed)
Paged cardiology re: patients BP. No new orders.

## 2021-10-22 NOTE — Progress Notes (Signed)
Monitored transport to [place]. Bedside updates given to receiving RN Rory Percy.

## 2021-10-23 DIAGNOSIS — I4819 Other persistent atrial fibrillation: Secondary | ICD-10-CM

## 2021-10-23 LAB — BASIC METABOLIC PANEL
Anion gap: 10 (ref 5–15)
BUN: 16 mg/dL (ref 8–23)
CO2: 24 mmol/L (ref 22–32)
Calcium: 9.1 mg/dL (ref 8.9–10.3)
Chloride: 106 mmol/L (ref 98–111)
Creatinine, Ser: 0.79 mg/dL (ref 0.44–1.00)
GFR, Estimated: >60 mL/min (ref >60)
Glucose, Bld: 102 mg/dL — ABNORMAL HIGH (ref 70–99)
Potassium: 3.9 mmol/L (ref 3.5–5.1)
Sodium: 140 mmol/L (ref 135–145)

## 2021-10-23 LAB — GLUCOSE, CAPILLARY: Glucose-Capillary: 145 mg/dL — ABNORMAL HIGH (ref 70–99)

## 2021-10-23 MED ORDER — FUROSEMIDE 40 MG PO TABS
40.0000 mg | ORAL_TABLET | Freq: Every day | ORAL | Status: DC
Start: 2021-10-23 — End: 2021-10-26
  Administered 2021-10-23 – 2021-10-24 (×2): 40 mg via ORAL
  Filled 2021-10-23 (×3): qty 1

## 2021-10-23 NOTE — Progress Notes (Signed)
Progress Note  Patient Name: Lauren Lloyd Date of Encounter: 10/23/2021  [facility] Cardiologist: Ida Rogue, MD   Subjective   No complaints.  Remains at her baseline.  Dofetilide was stopped yesterday due to episodes of polymorphic VT.  Inpatient Medications    Scheduled Meds:  apixaban  5 mg Oral BID   cholecalciferol  1,000 Units Oral Daily   ezetimibe  10 mg Oral Daily   furosemide  40 mg Intravenous Daily   potassium chloride  10 mEq Oral Daily   sodium chloride flush  3 mL Intravenous Q12H   sodium chloride flush  3 mL Intravenous Q12H   umeclidinium bromide  1 puff Inhalation Daily   Continuous Infusions:  sodium chloride     PRN Meds: sodium chloride, acetaminophen, metaxalone, sodium chloride flush   Vital Signs    Vitals:   10/22/21 1100 10/22/21 1205 10/22/21 2047 10/23/21 0327  BP: (!) 146/82 (!) 142/62 124/67 (!) 156/87  Pulse:  88 74 73  Resp: (!) '23 19 18 16  '$ Temp:  98.6 F (37 C) 98 F (36.7 C) 97.9 F (36.6 C)  TempSrc:  Oral Oral Oral  SpO2:  97% 95% 96%  Weight:      Height:       No intake or output data in the 24 hours ending 10/23/21 0820     10/21/2021    8:09 PM 10/18/2021   11:15 AM 08/31/2021    9:46 AM  Last 3 Weights  Weight (lbs) 219 lb 2.2 oz 219 lb 3.2 oz 215 lb  Weight (kg) 99.4 kg 99.428 kg 97.523 kg      Telemetry    Sinus rhythm-personally reviewed  ECG    None new  Physical Exam   GEN: Well nourished, well developed, in no acute distress  HEENT: normal  Neck: no JVD, carotid bruits, or masses Cardiac: RRR; no murmurs, rubs, or gallops,no edema  Respiratory:  clear to auscultation bilaterally, normal work of breathing GI: soft, nontender, nondistended, + BS MS: no deformity or atrophy  Skin: warm and dry Neuro:  Strength and sensation are intact Psych: euthymic mood, full affect   Labs    High Sensitivity Troponin:  No results for input(s): "TROPONINIHS" in the last 720 hours.    Chemistry Recent Labs  Lab 10/20/21 0338 10/21/21 0144 10/22/21 0157 10/23/21 0125  NA 138 137 140 140  K 3.9 4.0 3.8 3.9  CL 105 109 108 106  CO2 24 21* 24 24  GLUCOSE 101* 109* 114* 102*  BUN 28* 30* 17 16  CREATININE 1.28* 1.24* 0.85 0.79  CALCIUM 8.8* 8.8* 8.6* 9.1  MG 2.0 2.2 2.5*  --   GFRNONAA 42* 44* >60 >60  ANIONGAP '9 7 8 10     '$ Lipids No results for input(s): "CHOL", "TRIG", "HDL", "LABVLDL", "LDLCALC", "CHOLHDL" in the last 168 hours.  HematologyNo results for input(s): "WBC", "RBC", "HGB", "HCT", "MCV", "MCH", "MCHC", "RDW", "PLT" in the last 168 hours. Thyroid No results for input(s): "TSH", "FREET4" in the last 168 hours.  BNPNo results for input(s): "BNP", "PROBNP" in the last 168 hours.  DDimer No results for input(s): "DDIMER" in the last 168 hours.   Radiology    No results found.  Cardiac Studies    04/16/21: TTE  1. Left ventricular ejection fraction, by estimation, is 60 to 65%. The  left ventricle has normal function. The left ventricle has no regional  wall motion abnormalities. There is mild left  ventricular hypertrophy.  Left ventricular diastolic parameters  are indeterminate.   2. Right ventricular systolic function is mildly reduced. The right  ventricular size is mildly enlarged. There is moderately elevated  pulmonary artery systolic pressure. The estimated right ventricular  systolic pressure is 41.4 mmHg.   3. Left atrial size was moderately dilated.   4. Right atrial size was severely dilated.   5. The mitral valve is normal in structure. Moderate mitral valve  regurgitation.   6. Tricuspid valve regurgitation is severe.   7. The inferior vena cava is normal in size with <50% respiratory  variability, suggesting right atrial pressure of 8 mmHg.   Patient Profile     81 y.o. female emphysema, OSA (intolerant of CPAP), obesity, chronic CHF (diastolic), HTN, AFib admitted for Tikosyn load.  Assessment & Plan    1.  Persistent  atrial fibrillation CHA2DS2-VASc of 5 on Eliquis Failed Tikosyn nonsustained polymorphic VT Previously failed amiodarone  2.  Tachybradycardia syndrome: We Audianna Landgren need rate control for atrial fibrillation.  Plan for pacemaker on Monday.  She we Quitman Norberto hold Eliquis tomorrow night and likely restart Monday.  3.  Hypertension: We Cary Lothrop resume beta-blocker post pacemaker  4.  Untreated sleep apnea: Interested in revisiting at discharge.    For questions or updates, please contact [place] Please consult [email or web address] for contact info under        Signed, Milo Solana Meredith Leeds, MD  10/23/2021, 8:20 AM

## 2021-10-24 DIAGNOSIS — I4819 Other persistent atrial fibrillation: Secondary | ICD-10-CM | POA: Diagnosis not present

## 2021-10-24 MED ORDER — SODIUM CHLORIDE 0.9 % IV SOLN
250.0000 mL | INTRAVENOUS | Status: DC
  Administered 2021-10-25 (×2): 250 mL via INTRAVENOUS

## 2021-10-24 MED ORDER — SODIUM CHLORIDE 0.9 % IV SOLN: INTRAVENOUS | Status: DC

## 2021-10-24 MED ORDER — SODIUM CHLORIDE 0.9 % IV SOLN
80.0000 mg | INTRAVENOUS | Status: AC
  Administered 2021-10-25: 80 mg
  Filled 2021-10-24: qty 2

## 2021-10-24 MED ORDER — CHLORHEXIDINE GLUCONATE 4 % EX LIQD
60.0000 mL | Freq: Once | CUTANEOUS | Status: AC
  Administered 2021-10-24: 4 via TOPICAL
  Filled 2021-10-24: qty 60

## 2021-10-24 MED ORDER — CHLORHEXIDINE GLUCONATE 4 % EX LIQD
60.0000 mL | Freq: Once | CUTANEOUS | Status: AC
  Administered 2021-10-25: 4 via TOPICAL
  Filled 2021-10-24: qty 60

## 2021-10-24 MED ORDER — SODIUM CHLORIDE 0.9% FLUSH: 3.0000 mL | INTRAVENOUS | Status: DC | PRN

## 2021-10-24 MED ORDER — CEFAZOLIN SODIUM-DEXTROSE 2-4 GM/100ML-% IV SOLN
2.0000 g | INTRAVENOUS | Status: AC
  Administered 2021-10-25: 2 g via INTRAVENOUS
  Filled 2021-10-24: qty 100

## 2021-10-24 NOTE — Plan of Care (Signed)

## 2021-10-24 NOTE — Progress Notes (Signed)
Progress Note  Patient Name: Lauren Lloyd Date of Encounter: 10/24/2021  [facility] Cardiologist: Ida Rogue, MD   Subjective   Feeling well today.  Remains at her baseline.  No further VT.  Inpatient Medications    Scheduled Meds:  apixaban  5 mg Oral BID   cholecalciferol  1,000 Units Oral Daily   ezetimibe  10 mg Oral Daily   furosemide  40 mg Oral Daily   potassium chloride  10 mEq Oral Daily   sodium chloride flush  3 mL Intravenous Q12H   sodium chloride flush  3 mL Intravenous Q12H   umeclidinium bromide  1 puff Inhalation Daily   Continuous Infusions:  sodium chloride     PRN Meds: sodium chloride, acetaminophen, metaxalone, sodium chloride flush   Vital Signs    Vitals:   10/23/21 0855 10/23/21 1404 10/23/21 2030 10/24/21 0811  BP:  124/70 (!) 148/70   Pulse:  79 72   Resp:  14 16   Temp:  97.9 F (36.6 C) 98.1 F (36.7 C)   TempSrc:  Oral Oral   SpO2: 96% 94% 94% 95%  Weight:      Height:        Intake/Output Summary (Last 24 hours) at 10/24/2021 1146 Last data filed at 10/24/2021 1129 Gross per 24 hour  Intake 130 ml  Output --  Net 130 ml       10/21/2021    8:09 PM 10/18/2021   11:15 AM 08/31/2021    9:46 AM  Last 3 Weights  Weight (lbs) 219 lb 2.2 oz 219 lb 3.2 oz 215 lb  Weight (kg) 99.4 kg 99.428 kg 97.523 kg      Telemetry    Sinus rhythm-personally reviewed  ECG    None new  Physical Exam   GEN: Well nourished, well developed, in no acute distress  HEENT: normal  Neck: no JVD, carotid bruits, or masses Cardiac: RRR; no murmurs, rubs, or gallops,no edema  Respiratory:  clear to auscultation bilaterally, normal work of breathing GI: soft, nontender, nondistended, + BS MS: no deformity or atrophy  Skin: warm and dry,  Neuro:  Strength and sensation are intact Psych: euthymic mood, full affect   Labs    High Sensitivity Troponin:  No results for input(s): "TROPONINIHS" in the last 720 hours.   Chemistry Recent  Labs  Lab 10/20/21 0338 10/21/21 0144 10/22/21 0157 10/23/21 0125  NA 138 137 140 140  K 3.9 4.0 3.8 3.9  CL 105 109 108 106  CO2 24 21* 24 24  GLUCOSE 101* 109* 114* 102*  BUN 28* 30* 17 16  CREATININE 1.28* 1.24* 0.85 0.79  CALCIUM 8.8* 8.8* 8.6* 9.1  MG 2.0 2.2 2.5*  --   GFRNONAA 42* 44* >60 >60  ANIONGAP '9 7 8 10     '$ Lipids No results for input(s): "CHOL", "TRIG", "HDL", "LABVLDL", "LDLCALC", "CHOLHDL" in the last 168 hours.  HematologyNo results for input(s): "WBC", "RBC", "HGB", "HCT", "MCV", "MCH", "MCHC", "RDW", "PLT" in the last 168 hours. Thyroid No results for input(s): "TSH", "FREET4" in the last 168 hours.  BNPNo results for input(s): "BNP", "PROBNP" in the last 168 hours.  DDimer No results for input(s): "DDIMER" in the last 168 hours.   Radiology    No results found.  Cardiac Studies    04/16/21: TTE  1. Left ventricular ejection fraction, by estimation, is 60 to 65%. The  left ventricle has normal function. The left ventricle has no  regional  wall motion abnormalities. There is mild left ventricular hypertrophy.  Left ventricular diastolic parameters  are indeterminate.   2. Right ventricular systolic function is mildly reduced. The right  ventricular size is mildly enlarged. There is moderately elevated  pulmonary artery systolic pressure. The estimated right ventricular  systolic pressure is 76.1 mmHg.   3. Left atrial size was moderately dilated.   4. Right atrial size was severely dilated.   5. The mitral valve is normal in structure. Moderate mitral valve  regurgitation.   6. Tricuspid valve regurgitation is severe.   7. The inferior vena cava is normal in size with <50% respiratory  variability, suggesting right atrial pressure of 8 mmHg.   Patient Profile     81 y.o. female emphysema, OSA (intolerant of CPAP), obesity, chronic CHF (diastolic), HTN, AFib admitted for Tikosyn load.  Assessment & Plan    1..  Persistent atrial fibrillation:  Currently on Eliquis.  Failed Tikosyn due to monomorphic VT.  Also failed amiodarone.  Rhythm control per primary EP.  2.  Tachybradycardia syndrome: Plan for pacemaker tomorrow.  Holding Eliquis tonight and tomorrow morning.  We Dayton Kenley restart Monday.  3.  Hypertension: Elevated today.  Restart beta-blocker post pacemaker.    For questions or updates, please contact [place] Please consult [email or web address] for contact info under        Signed, Pragya Lofaso Meredith Leeds, MD  10/24/2021, 11:46 AM

## 2021-10-25 ENCOUNTER — Encounter (HOSPITAL_COMMUNITY)
Admission: AD | Disposition: A | Discharge status: Home / Self Care | Payer: Self-pay | Source: Ambulatory Visit | Attending: Cardiology

## 2021-10-25 ENCOUNTER — Encounter (HOSPITAL_COMMUNITY): Payer: Self-pay | Admitting: Internal Medicine

## 2021-10-25 DIAGNOSIS — I495 Sick sinus syndrome: Secondary | ICD-10-CM | POA: Diagnosis not present

## 2021-10-25 HISTORY — PX: PACEMAKER IMPLANT: EP1218

## 2021-10-25 LAB — BASIC METABOLIC PANEL
Anion gap: 8 (ref 5–15)
BUN: 22 mg/dL (ref 8–23)
CO2: 22 mmol/L (ref 22–32)
Calcium: 9.1 mg/dL (ref 8.9–10.3)
Chloride: 108 mmol/L (ref 98–111)
Creatinine, Ser: 0.85 mg/dL (ref 0.44–1.00)
GFR, Estimated: >60 mL/min (ref >60)
Glucose, Bld: 98 mg/dL (ref 70–99)
Potassium: 3.9 mmol/L (ref 3.5–5.1)
Sodium: 138 mmol/L (ref 135–145)

## 2021-10-25 LAB — CBC
HCT: 41.2 % (ref 36.0–46.0)
Hemoglobin: 13.9 g/dL (ref 12.0–15.0)
MCH: 31.4 pg (ref 26.0–34.0)
MCHC: 33.7 g/dL (ref 30.0–36.0)
MCV: 93.2 fL (ref 80.0–100.0)
Platelets: 241 10*3/uL (ref 150–400)
RBC: 4.42 MIL/uL (ref 3.87–5.11)
RDW: 13 % (ref 11.5–15.5)
WBC: 6.6 10*3/uL (ref 4.0–10.5)
nRBC: 0 % (ref 0.0–0.2)

## 2021-10-25 LAB — SURGICAL PCR SCREEN
MRSA, PCR: NEGATIVE
Staphylococcus aureus: POSITIVE — AB

## 2021-10-25 SURGERY — PACEMAKER IMPLANT

## 2021-10-25 MED ORDER — FENTANYL CITRATE (PF) 100 MCG/2ML IJ SOLN
INTRAMUSCULAR | Status: AC
  Filled 2021-10-25: qty 2

## 2021-10-25 MED ORDER — ONDANSETRON HCL 4 MG/2ML IJ SOLN
4.0000 mg | Freq: Four times a day (QID) | INTRAMUSCULAR | Status: DC | PRN
Start: 2021-10-25 — End: 2021-10-26

## 2021-10-25 MED ORDER — HEPARIN (PORCINE) IN NACL 1000-0.9 UT/500ML-% IV SOLN
INTRAVENOUS | Status: DC | PRN
  Administered 2021-10-25: 500 mL

## 2021-10-25 MED ORDER — CHLORHEXIDINE GLUCONATE CLOTH 2 % EX PADS
6.0000 | MEDICATED_PAD | Freq: Every day | CUTANEOUS | Status: DC
  Administered 2021-10-25 – 2021-10-26 (×2): 6 via TOPICAL

## 2021-10-25 MED ORDER — FENTANYL CITRATE (PF) 100 MCG/2ML IJ SOLN
INTRAMUSCULAR | Status: DC | PRN
  Administered 2021-10-25 (×2): 25 ug via INTRAVENOUS

## 2021-10-25 MED ORDER — MIDAZOLAM HCL 5 MG/5ML IJ SOLN
INTRAMUSCULAR | Status: DC | PRN
  Administered 2021-10-25 (×2): 1 mg via INTRAVENOUS

## 2021-10-25 MED ORDER — SODIUM CHLORIDE 0.9 % IV SOLN
INTRAVENOUS | Status: AC
  Filled 2021-10-25: qty 2

## 2021-10-25 MED ORDER — MUPIROCIN 2 % EX OINT
1.0000 "application " | TOPICAL_OINTMENT | Freq: Two times a day (BID) | CUTANEOUS | Status: DC
  Administered 2021-10-25 – 2021-10-26 (×3): 1 via NASAL
  Filled 2021-10-25 (×2): qty 22

## 2021-10-25 MED ORDER — MIDAZOLAM HCL 5 MG/5ML IJ SOLN
INTRAMUSCULAR | Status: AC
  Filled 2021-10-25: qty 5

## 2021-10-25 MED ORDER — CEFAZOLIN SODIUM-DEXTROSE 2-4 GM/100ML-% IV SOLN
INTRAVENOUS | Status: AC
  Filled 2021-10-25: qty 100

## 2021-10-25 MED ORDER — LIDOCAINE HCL (PF) 1 % IJ SOLN
INTRAMUSCULAR | Status: DC | PRN
  Administered 2021-10-25: 60 mL

## 2021-10-25 SURGICAL SUPPLY — 12 items
CABLE SURGICAL S-101-97-12 (CABLE) ×2 IMPLANT
CATH SELECT PACE 669183 (CATHETERS) ×1 IMPLANT
CUTTER LV DELIVERY CATHETER 7 (MISCELLANEOUS) ×1 IMPLANT
LEAD INGEVITY 7841 52 (Lead) ×1 IMPLANT
LEAD INGEVITY 7842 59 (Lead) ×1 IMPLANT
MAT PREVALON FULL STRYKER (MISCELLANEOUS) ×1 IMPLANT
PACEMAKER ACCOLADE DR-EL (Pacemaker) ×1 IMPLANT
PAD DEFIB RADIO PHYSIO CONN (PAD) ×2 IMPLANT
SHEATH 7FR PRELUDE SNAP 13 (SHEATH) ×1 IMPLANT
SHEATH 8FR PRELUDE SNAP 13 (SHEATH) ×1 IMPLANT
TRAY PACEMAKER INSERTION (PACKS) ×2 IMPLANT
WIRE HI TORQ VERSACORE-J 145CM (WIRE) ×1 IMPLANT

## 2021-10-25 NOTE — Plan of Care (Signed)

## 2021-10-25 NOTE — Care Management Important Message (Signed)
Important Message  Patient Details  Name: Lauren Lloyd MRN: [id number] Date of Birth: 1940/11/08   Medicare Important Message Given:  Yes     [name] 10/25/2021, 8:26 AM

## 2021-10-25 NOTE — Care Management (Signed)
10-25-21 1332 Case Manager following the patient for disposition needs. Plan for a PPM today. Case Manager will continue to follow for additional transition of care needs.

## 2021-10-25 NOTE — Progress Notes (Signed)
Electrophysiology Rounding Note  Patient Name: Lauren Lloyd Date of Encounter: 10/25/2021  Primary Cardiologist: Ida Rogue, MD Electrophysiologist: Vickie Epley, MD   Subjective   The patient is doing well today.  At this time, the patient denies chest pain, shortness of breath, or any new concerns.  Inpatient Medications    Scheduled Meds:  chlorhexidine  60 mL Topical Once   Chlorhexidine Gluconate Cloth  6 each Topical Q0600   cholecalciferol  1,000 Units Oral Daily   ezetimibe  10 mg Oral Daily   furosemide  40 mg Oral Daily   gentamicin (GARAMYCIN) 80 mg in sodium chloride 0.9 % 500 mL irrigation  80 mg Irrigation To Cath   mupirocin ointment  1 application  Nasal BID   potassium chloride  10 mEq Oral Daily   sodium chloride flush  3 mL Intravenous Q12H   sodium chloride flush  3 mL Intravenous Q12H   umeclidinium bromide  1 puff Inhalation Daily   Continuous Infusions:  sodium chloride     sodium chloride 50 mL/hr at 10/25/21 0609   sodium chloride 250 mL (10/25/21 0609)    ceFAZolin (ANCEF) IV     PRN Meds: sodium chloride, acetaminophen, metaxalone, sodium chloride flush, sodium chloride flush   Vital Signs    Vitals:   10/24/21 1242 10/24/21 2017 10/25/21 0447 10/25/21 0645  BP: (!) 156/82 134/79 (!) 164/73 135/68  Pulse: 79 79 81   Resp: '17 16 16   '$ Temp: 98.1 F (36.7 C) 98.1 F (36.7 C) 97.7 F (36.5 C)   TempSrc: Oral Oral Oral   SpO2: 94% 91% 97%   Weight:      Height:        Intake/Output Summary (Last 24 hours) at 10/25/2021 0801 Last data filed at 10/24/2021 1129 Gross per 24 hour  Intake 10 ml  Output --  Net 10 ml   Filed Weights   10/21/21 2009  Weight: 99.4 kg    Physical Exam    GEN- The patient is well appearing, alert and oriented x 3 today.   Head- normocephalic, atraumatic Eyes-  Sclera clear, conjunctiva pink Ears- hearing intact Oropharynx- clear Neck- supple Lungs- Clear to ausculation bilaterally,  normal work of breathing Heart-  Tachyy but regular  rate and rhythm, no murmurs, rubs or gallops GI- soft, NT, ND, + BS Extremities- no clubbing or cyanosis. No edema Skin- no rash or lesion Psych- euthymic mood, full affect Neuro- strength and sensation are intact  Labs    CBC Recent Labs    10/25/21 0113  WBC 6.6  HGB 13.9  HCT 41.2  MCV 93.2  PLT 539   Basic Metabolic Panel Recent Labs    10/23/21 0125 10/25/21 0113  NA 140 138  K 3.9 3.9  CL 106 108  CO2 24 22  GLUCOSE 102* 98  BUN 16 22  CREATININE 0.79 0.85  CALCIUM 9.1 9.1   Liver Function Tests No results for input(s): "AST", "ALT", "ALKPHOS", "BILITOT", "PROT", "ALBUMIN" in the last 72 hours. No results for input(s): "LIPASE", "AMYLASE" in the last 72 hours. Cardiac Enzymes No results for input(s): "CKTOTAL", "CKMB", "CKMBINDEX", "TROPONINI" in the last 72 hours.   Telemetry    NSR/Sinus tach 80-100s (personally reviewed)  Radiology    No results found.  Patient Profile     81 y.o. female emphysema, OSA (intolerant of CPAP), obesity, chronic CHF (diastolic), HTN, AFib admitted for Tikosyn load -> failed with VT and also failed  amiodarone.   Assessment & Plan    Tachy-brady syndrome Plan PPM today.  Explained risks, benefits, and alternatives to PPM implantation, including but not limited to bleeding, infection, pneumothorax, pericardial effusion, lead dislodgement, heart attack, stroke, or death.  Pt verbalized understanding and agrees to proceed   2. Persistent AF Failed tikosyn with VT and has also failed amiodarone.  Resume BB once pacer placed.  Eliquis held.   3. HTN Resume BB post pacer   For questions or updates, please contact [place] Please consult [email or web address] for contact info under Cardiology/STEMI.  Signed, Shirley Friar, PA-C  10/25/2021, 8:01 AM

## 2021-10-26 ENCOUNTER — Ambulatory Visit: Payer: Medicare HMO | Admitting: Cardiovascular Disease

## 2021-10-26 ENCOUNTER — Inpatient Hospital Stay (HOSPITAL_COMMUNITY): Payer: Medicare HMO

## 2021-10-26 ENCOUNTER — Encounter (HOSPITAL_COMMUNITY): Payer: Self-pay | Admitting: Cardiology

## 2021-10-26 DIAGNOSIS — I4819 Other persistent atrial fibrillation: Secondary | ICD-10-CM | POA: Diagnosis not present

## 2021-10-26 DIAGNOSIS — Z950 Presence of cardiac pacemaker: Secondary | ICD-10-CM | POA: Diagnosis not present

## 2021-10-26 MED ORDER — APIXABAN 5 MG PO TABS: 5.0000 mg | ORAL_TABLET | Freq: Two times a day (BID) | ORAL | 1 refills | Status: DC

## 2021-10-26 MED ORDER — BISOPROLOL FUMARATE 5 MG PO TABS
5.0000 mg | ORAL_TABLET | Freq: Two times a day (BID) | ORAL | Status: DC
  Administered 2021-10-26: 5 mg via ORAL
  Filled 2021-10-26: qty 1

## 2021-10-26 MED ORDER — MUPIROCIN 2 % EX OINT: 1.0000 | TOPICAL_OINTMENT | Freq: Two times a day (BID) | CUTANEOUS | 0 refills | Status: DC

## 2021-10-26 NOTE — Progress Notes (Signed)
Pt returned from radiology.

## 2021-10-26 NOTE — Discharge Instructions (Signed)
After Your Pacemaker   You have a Chiropractor  ACTIVITY Do not lift your arm above shoulder height for 1 week after your procedure. After 7 days, you may progress as below.  You should remove your sling 24 hours after your procedure, unless otherwise instructed by your provider.     Tuesday November 02, 2021  Wednesday November 03, 2021 Thursday November 04, 2021 Friday November 05, 2021   Do not lift, push, pull, or carry anything over 10 pounds with the affected arm until 6 weeks (Tuesday December 07, 2021 ) after your procedure.   You may drive AFTER your wound check, unless you have been told otherwise by your provider.   Ask your healthcare provider when you can go back to work   INCISION/Dressing If you are on a blood thinner such as Coumadin, Xarelto, Eliquis, Plavix, or Pradaxa please confirm with your provider when this should be resumed.   If large square, outer bandage is left in place, this can be removed after 24 hours from your procedure. Do not remove steri-strips or glue as below.   Monitor your Pacemaker site for redness, swelling, and drainage. Call the device clinic at [phone] if you experience these symptoms or fever/chills.  If your incision is sealed with Steri-strips or staples, you may shower 7 days after your procedure or when told by your provider. Do not remove the steri-strips or let the shower hit directly on your site. You may wash around your site with soap and water.    If you were discharged in a sling, please do not wear this during the day more than 48 hours after your surgery unless otherwise instructed. This may increase the risk of stiffness and soreness in your shoulder.   Avoid lotions, ointments, or perfumes over your incision until it is well-healed.  You may use a hot tub or a pool AFTER your wound check appointment if the incision is completely closed.  Pacemaker Alerts:  Some alerts are vibratory and others beep. These are NOT  emergencies. Please call our office to let us know. If this occurs at night or on weekends, it can wait until the next business day. Send a remote transmission.  If your device is capable of reading fluid status (for heart failure), you will be offered monthly monitoring to review this with you.   DEVICE MANAGEMENT Remote monitoring is used to monitor your pacemaker from home. This monitoring is scheduled every 91 days by our office. It allows [name] to keep an eye on the functioning of your device to ensure it is working properly. You will routinely see your Electrophysiologist annually (more often if necessary).   You should receive your ID card for your new device in 4-8 weeks. Keep this card with you at all times once received. Consider wearing a medical alert bracelet or necklace.  Your Pacemaker may be MRI compatible. This will be discussed at your next office visit/wound check.  You should avoid contact with strong electric or magnetic fields.   Do not use amateur (ham) radio equipment or electric (arc) welding torches. MP3 player headphones with magnets should not be used. Some devices are safe to use if held at least 12 inches (30 cm) from your Pacemaker. These include power tools, lawn mowers, and speakers. If you are unsure if something is safe to use, ask your health care provider.  When using your cell phone, hold it to the ear that is on the opposite side from  the Pacemaker. Do not leave your cell phone in a pocket over the Pacemaker.  You may safely use electric blankets, heating pads, computers, and microwave ovens.  Call the office right away if: You have chest pain. You feel more short of breath than you have felt before. You feel more light-headed than you have felt before. Your incision starts to open up.  This information is not intended to replace advice given to you by your health care provider. Make sure you discuss any questions you have with your health care provider.

## 2021-10-26 NOTE — Discharge Summary (Signed)
ELECTROPHYSIOLOGY PROCEDURE DISCHARGE SUMMARY    Patient ID: Lauren Lloyd,  MRN: [id number], DOB/AGE: 1940/07/16 81 y.o.  Admit date: 10/18/2021 Discharge date: 10/26/2021  Primary Care Physician: [name], MD  Primary Cardiologist: Ida Rogue, MD  Electrophysiologist: Vickie Epley, MD   Primary Discharge Diagnosis:  Symptomatic bradycardia status post pacemaker implantation this admission  Secondary Discharge Diagnosis:  Persistent atrial fibrillation Nonsustained VT on tikosyn  Allergies  Allergen Reactions   Levofloxacin     Other reaction(s): Joint Pains    Influenza Vaccines Other (See Comments)    Bell's Palsy   Oysters [Shellfish Allergy] Swelling    She states she had eaten them three days in a row and she developed swelling around her eyes.      Procedures This Admission:  1.  Implantation of a [employer] dual chamber PPM on 10/25/2021 by Dr. Quentin Ore. The patient received a Sports coach number [id number] PPM with model number [id number] right atrial lead and [id number] right ventricular lead. There were no immediate post procedure complications. 2.  CXR on 10/26/21 demonstrated no pneumothorax status post device implantation.   Hospital Course:   Lauren Lloyd is a 81 y.o. female was admitted 10/18/2021 for tikosyn load.   Unfortunately, she had NSPMVT 6/8 and tikosyn was stopped. She was observed to have tachy-brady syndrome while here, and had previously failed amiodarone.   She did convert chemically on tikosyn. Eliquis was held on Sunday evening with anticipitation of pacing on Monday. Past medical history includes above.  The patient has had symptomatic tachy-brady syndrome.  Risks, benefits, and alternatives to PPM implantation were reviewed with the patient who wished to proceed.   The patient was underwent implantation of a [employer] dual chamber PPM with details as outlined above.  She was monitored on telemetry overnight which  demonstrated NSR.  Left chest was without hematoma or ecchymosis.  The device was interrogated and found to be functioning normally.  CXR was obtained and demonstrated  no pneumothorax status post device implantation.  Wound care, arm mobility, and restrictions were reviewed with the patient.  The patient was examined and considered stable for discharge to home.    Given chemical cardioversion, her eliquis is planned to be resumed tomorrow evening, 10/27/2021.   Physical Exam: Vitals:   10/25/21 2000 10/25/21 2026 10/26/21 0000 10/26/21 0428  BP: 136/70 (!) 138/93 136/68 (!) 185/78  Pulse:  78  72  Resp: '16 14 16 16  '$ Temp: 98 F (36.7 C) 98 F (36.7 C) 98.2 F (36.8 C) 97.7 F (36.5 C)  TempSrc: Oral Oral Oral Oral  SpO2:  97%  95%  Weight:      Height:        GEN- The patient is well appearing, alert and oriented x 3 today.   HEENT: normocephalic, atraumatic; sclera clear, conjunctiva pink; hearing intact; oropharynx clear; neck supple, no JVP Lymph- no cervical lymphadenopathy Lungs- Clear to ausculation bilaterally, normal work of breathing.  No wheezes, rales, rhonchi Heart- Regular rate and rhythm, no murmurs, rubs or gallops, PMI not laterally displaced GI- soft, non-tender, non-distended, bowel sounds present, no hepatosplenomegaly Extremities- no clubbing, cyanosis, or edema; DP/PT/radial pulses 2+ bilaterally MS- no significant deformity or atrophy Skin- warm and dry, no rash or lesion, left chest without hematoma/ecchymosis Psych- euthymic mood, full affect Neuro- strength and sensation are intact   Labs:   Lab Results  Component Value Date   WBC 6.6 10/25/2021  HGB 13.9 10/25/2021   HCT 41.2 10/25/2021   MCV 93.2 10/25/2021   PLT 241 10/25/2021    Recent Labs  Lab 10/25/21 0113  NA 138  K 3.9  CL 108  CO2 22  BUN 22  CREATININE 0.85  CALCIUM 9.1  GLUCOSE 98    Discharge Medications:  Allergies as of 10/26/2021       Reactions   Levofloxacin     Other reaction(s): Joint Pains   Influenza Vaccines Other (See Comments)   Bell's Palsy   Oysters [shellfish Allergy] Swelling   She states she had eaten them three days in a row and she developed swelling around her eyes.         Medication List     TAKE these medications    acetaminophen 325 MG tablet Commonly known as: TYLENOL Take 650 mg by mouth every 6 (six) hours as needed for moderate pain.   apixaban 5 MG Tabs tablet Commonly known as: Eliquis Take 1 tablet (5 mg total) by mouth 2 (two) times daily. Re-start 6/14 with evening dose. Start taking on: October 27, 2021 What changed:  how much to take additional instructions   BIOFREEZE EX Apply 1 application. topically daily as needed (Neck pain).   bisoprolol 5 MG tablet Commonly known as: ZEBETA Take 1 tablet (5 mg total) by mouth 2 (two) times daily.   calcium carbonate 750 MG chewable tablet Commonly known as: TUMS EX Chew 2 tablets by mouth daily as needed for heartburn.   Cholecalciferol 25 MCG (1000 UT) tablet Take 1,000 Units by mouth daily.   ezetimibe 10 MG tablet Commonly known as: Zetia Take 1 tablet (10 mg total) by mouth daily.   furosemide 40 MG tablet Commonly known as: LASIX Take 1 tablet (40 mg total) by mouth daily. Take extra 40 mg as needed after lunch for abdominal swelling, leg swelling, or shortness of breath   metaxalone 800 MG tablet Commonly known as: SKELAXIN Take 800 mg by mouth daily as needed for muscle spasms.   multivitamin capsule Take 1 capsule by mouth daily.   mupirocin ointment 2 % Commonly known as: BACTROBAN Place 1 application  into the nose 2 (two) times daily.   potassium chloride 10 MEQ tablet Commonly known as: KLOR-CON TAKE 1 TABLET BY MOUTH DAILY   Spiriva Respimat 2.5 MCG/ACT Aers Generic drug: Tiotropium Bromide Monohydrate Inhale 2 puffs into the lungs daily.        Disposition:    Follow-up Information     [facility] Follow up.   Why: on 6/22 at 1040 for post pacemaker wound check Contact information: [place] [id number] [phone]                Duration of Discharge Encounter: Greater than 30 minutes including physician time.  Jacalyn Lefevre, PA-C  10/26/2021 8:17 AM

## 2021-10-26 NOTE — Progress Notes (Signed)
Pt to radiology via wheelchair

## 2021-10-28 ENCOUNTER — Other Ambulatory Visit (HOSPITAL_COMMUNITY): Payer: Self-pay | Admitting: *Deleted

## 2021-10-28 ENCOUNTER — Ambulatory Visit (HOSPITAL_COMMUNITY): Payer: Medicare HMO | Admitting: Physician Assistant

## 2021-10-29 ENCOUNTER — Telehealth: Payer: Self-pay

## 2021-10-29 NOTE — Telephone Encounter (Signed)
NA, needs appt w/ Dr.B

## 2021-10-29 NOTE — Telephone Encounter (Signed)
Gae Bon is calling Dr. Stephens Shire, Cardiology & Sleep. Pt is calling for an appt. According to encounter order was already sent. Is Dr. B managing this?  No appt available 8/23 with Dr. Radford Pax.  [phone]

## 2021-11-01 NOTE — Telephone Encounter (Signed)
I believe that Cardiology has been managing her OSA.  We just ordered supplies after she called needing them this time.  Didn't mean to interfere.

## 2021-11-02 ENCOUNTER — Telehealth: Payer: Self-pay

## 2021-11-02 NOTE — Progress Notes (Signed)
Electrophysiology Office Note Date: 11/04/2021  ID:  Lauren Lloyd, Lauren Lloyd 08-Oct-1940, MRN [id number]  PCP: Erasmo Downer, MD Primary Cardiologist: Julien Nordmann, MD Electrophysiologist: Lanier Prude, MD   CC: Pacemaker follow-up  Lauren Lloyd is a 81 y.o. female seen today for Lanier Prude, MD for post hospital follow up.    Admitted for tikosyn earlier this month, which ultimately had to be stopped due to VT. She was also noted to have tachy-brady syndrome and underwent pacing after tikosyn wash out.   Since discharge from hospital the patient reports doing about the same, maybe slightly better. She was expecting a pacemaker to be a "magic pill" and is somewhat disappointed that it hasn't been. She has brief, fleeting episodes of lightheadedness that come and go quickly, without any clear aggravating or relieving factors. she denies chest pain, dyspnea, PND, orthopnea, nausea, vomiting, dizziness, syncope, edema, weight gain, or early satiety.  Device History: Field seismologist PPM implanted 10/25/2021 for tachy brady  Past Medical History:  Diagnosis Date   (HFpEF) heart failure with preserved ejection fraction (HCC)    a. 05/2018 Echo: EF 55-60%, no rwma, mild to mod MR. Nl RV fxn. Mod TR. PASP .   Arthritis    knees, Hands   Arthritis of knee    Back pain    Carotid arterial disease (HCC)    a. 03/2019 Carotid U/S: <50% bilat ICA stenoses.   Cholelithiasis    a. 10/2018 noted on CT.   Edema, lower extremity    Fatty liver    GERD (gastroesophageal reflux disease)    History of stress test    a. 06/2018 MV: EF 59%, no ischemia/infarct. Low risk.   Knee pain    Lactose intolerance    Mitral regurgitation    a. 05/2018 Echo: mild to mod MR.   Multinodular goiter    Obesity    OSA (obstructive sleep apnea)    PAF (paroxysmal atrial fibrillation) (HCC)    a.  Diagnosed 12/19; b. 05/2018 s/p DCCV; c. 03/2019 & 05/2019 recurrent AFib-->managed  w/ amio load; d. CHADS2VASc = 6 (CHF, HTN, age x 2, vascular disease, female)-->Eliquis & amio 100 qd.   PAH (pulmonary artery hypertension) (HCC)    Scoliosis    SOB (shortness of breath)    Swallowing difficulty    Past Surgical History:  Procedure Laterality Date   CARDIOVERSION N/A 06/15/2018   Procedure: CARDIOVERSION (CATH LAB);  Surgeon: Antonieta Iba, MD;  Location: [facility];  Service: Cardiovascular;  Laterality: N/A;   CARDIOVERSION N/A 02/18/2021   Procedure: CARDIOVERSION;  Surgeon: Antonieta Iba, MD;  Location: [facility];  Service: Cardiovascular;  Laterality: N/A;   CATARACT EXTRACTION W/PHACO Right 01/25/2016   Procedure: CATARACT EXTRACTION PHACO AND INTRAOCULAR LENS PLACEMENT (IOC);  Surgeon: Sherald Hess, MD;  Location: [facility];  Service: Ophthalmology;  Laterality: Right;  RIGHT   CATARACT EXTRACTION W/PHACO Left 02/22/2016   Procedure: CATARACT EXTRACTION PHACO AND INTRAOCULAR LENS PLACEMENT (IOC);  Surgeon: Sherald Hess, MD;  Location: [facility];  Service: Ophthalmology;  Laterality: Left;  LEFT   HAMMER TOE SURGERY  05/16/2008   KNEE ARTHROSCOPY Right 05/16/2002   PACEMAKER IMPLANT N/A 10/25/2021   Procedure: PACEMAKER IMPLANT;  Surgeon: Lanier Prude, MD;  Location: [facility];  Service: Cardiovascular;  Laterality: N/A;   REPLACEMENT TOTAL KNEE Right 05/17/2007   [facility]   SKIN GRAFT Left 04/08/2013  Done on left index finger   TONSILLECTOMY  05/16/1944   TOTAL KNEE ARTHROPLASTY Left 12/02/2020    Current Outpatient Medications  Medication Sig Dispense Refill   acetaminophen (TYLENOL) 325 MG tablet Take 650 mg by mouth every 6 (six) hours as needed for moderate pain.     apixaban (ELIQUIS) 5 MG TABS tablet Take 1 tablet (5 mg total) by mouth 2 (two) times daily. Re-start 6/14 with evening dose. 180 tablet 1   calcium carbonate (TUMS EX) 750 MG chewable tablet Chew 2 tablets  by mouth daily as needed for heartburn.     Cholecalciferol 25 MCG (1000 UT) tablet Take 1,000 Units by mouth daily.     ezetimibe (ZETIA) 10 MG tablet Take 1 tablet (10 mg total) by mouth daily. 90 tablet 3   furosemide (LASIX) 40 MG tablet Take 1 tablet (40 mg total) by mouth daily. Take extra 40 mg as needed after lunch for abdominal swelling, leg swelling, or shortness of breath 180 tablet 0   Menthol, Topical Analgesic, (BIOFREEZE EX) Apply 1 application. topically daily as needed (Neck pain).     metaxalone (SKELAXIN) 800 MG tablet Take 800 mg by mouth daily as needed for muscle spasms.     Multiple Vitamin (MULTIVITAMIN) capsule Take 1 capsule by mouth daily.     mupirocin ointment (BACTROBAN) 2 % Place 1 application  into the nose 2 (two) times daily. 22 g 0   potassium chloride (KLOR-CON) 10 MEQ tablet TAKE 1 TABLET BY MOUTH DAILY (Patient taking differently: Take 10 mEq by mouth daily.) 30 tablet 0   Tiotropium Bromide Monohydrate (SPIRIVA RESPIMAT) 2.5 MCG/ACT AERS Inhale 2 puffs into the lungs daily. 4 g 1   bisoprolol (ZEBETA) 5 MG tablet Take 5mg  in the morning; Take 7.5mg  at bedtime 225 tablet 3   No current facility-administered medications for this visit.    Allergies:   Levofloxacin, Influenza vaccines, and Oysters [shellfish allergy]   Social History: Social History   Socioeconomic History   Marital status: Single    Spouse name: Not on file   Number of children: 5   Years of education: college   Highest education level: Bachelor's degree (e.g., BA, AB, BS)  Occupational History   Occupation: Lawyer: [employer]  Tobacco Use   Smoking status: Former    Packs/day: 1.00    Years: 30.00    Total pack years: 30.00    Types: Cigarettes    Quit date: 05/16/1989    Years since quitting: 32.4   Smokeless tobacco: Never  Vaping Use   Vaping Use: Never used  Substance and Sexual Activity   Alcohol use: Yes    Alcohol/week: 7.0  standard drinks of alcohol    Types: 7 Glasses of wine per week    Comment: 0-2 a night   Drug use: No   Sexual activity: Not Currently  Other Topics Concern   Not on file  Social History Narrative   Pt has a child who passed away at age 31   Social Determinants of Health   Financial Resource Strain: Low Risk  (08/13/2021)   Overall Financial Resource Strain (CARDIA)    Difficulty of Paying Living Expenses: Not hard at all  Food Insecurity: No Food Insecurity (02/03/2020)   Hunger Vital Sign    Worried About Running Out of Food in the Last Year: Never true    Ran Out of Food in the Last Year: Never true  Transportation Needs: No Transportation Needs (04/03/2020)   PRAPARE - Administrator, Civil Service (Medical): No    Lack of Transportation (Non-Medical): No  Physical Activity: Insufficiently Active (02/03/2020)   Exercise Vital Sign    Days of Exercise per Week: 2 days    Minutes of Exercise per Session: 40 min  Stress: No Stress Concern Present (02/03/2020)   [employer] of Occupational Health - Occupational Stress Questionnaire    Feeling of Stress : Not at all  Social Connections: Moderately Integrated (02/03/2020)   Social Connection and Isolation Panel [NHANES]    Frequency of Communication with Friends and Family: More than three times a week    Frequency of Social Gatherings with Friends and Family: More than three times a week    Attends Religious Services: More than 4 times per year    Active Member of [employer] or Organizations: Yes    Attends Engineer, structural: More than 4 times per year    Marital Status: Divorced  Intimate Partner Violence: Not At Risk (02/03/2020)   Humiliation, Afraid, Rape, and Kick questionnaire    Fear of Current or Ex-Partner: No    Emotionally Abused: No    Physically Abused: No    Sexually Abused: No    Family History: Family History  Problem Relation Age of Onset   Hyperlipidemia Sister    Atrial  fibrillation Sister    Transient ischemic attack Mother    Heart disease Mother    Heart attack Father    High blood pressure Father    Stroke Father    Healthy Brother    Breast cancer Sister 38   Hyperlipidemia Sister    Atrial fibrillation Sister      Review of Systems: All other systems reviewed and are otherwise negative except as noted above.  Physical Exam: Vitals:   11/04/21 1039  BP: 114/66  Pulse: 60  SpO2: 97%  Weight: 214 lb 12.8 oz (97.4 kg)  Height: 5\' 7"  (1.702 m)     GEN- The patient is well appearing, alert and oriented x 3 today.   HEENT: normocephalic, atraumatic; sclera clear, conjunctiva pink; hearing intact; oropharynx clear; neck supple  Lungs- Clear to ausculation bilaterally, normal work of breathing.  No wheezes, rales, rhonchi Heart- Regular rate and rhythm, no murmurs, rubs or gallops  GI- soft, non-tender, non-distended, bowel sounds present  Extremities- no clubbing or cyanosis. No edema MS- no significant deformity or atrophy Skin- warm and dry, no rash or lesion; PPM pocket well healed Psych- euthymic mood, full affect Neuro- strength and sensation are intact  PPM Interrogation- reviewed in detail today,  See PACEART report  EKG:  EKG is not ordered today.  Recent Labs: 08/16/2021: ALT CANCELED 10/22/2021: Magnesium 2.5 10/25/2021: BUN 22; Creatinine, Ser 0.85; Hemoglobin 13.9; Platelets 241; Potassium 3.9; Sodium 138   Wt Readings from Last 3 Encounters:  11/04/21 214 lb 12.8 oz (97.4 kg)  10/21/21 219 lb 2.2 oz (99.4 kg)  10/18/21 219 lb 3.2 oz (99.4 kg)     Other studies Reviewed: Additional studies/ records that were reviewed today include: Previous EP office notes, Previous remote checks, Most recent labwork.   Assessment and Plan:  1. Tachy-Brady syndrome s/p [employer] PPM  Normal PPM function See Arita Miss Art report No changes today Wound site is stable. Steri-strips removed. V lead stable, A lead limited testing ->  AF  2. Persistent AF She is back in AF today, rate mostly controlled. Increase  bisoprolol to 7.5 mg qhs and 5 mg qam She has previously failed amiodarone, so will likely need to consider rate control alone now that she is s/p pacer, vs eventual AV nodal ablation. She is not very interested in AV nodal ablation at this time.   3. HTN Stable on current regimen   Current medicines are reviewed at length with the patient today.    Labs/ tests ordered today include:  Orders Placed This Encounter  Procedures   Basic metabolic panel   CBC    Disposition:   Follow up with Dr. Lalla Brothers in  as scheduled    Signed, Graciella Freer, PA-C  11/04/2021 11:53 AM  [facility] [address] [place] [address] [phone] (office) [phone] (fax)

## 2021-11-02 NOTE — Telephone Encounter (Signed)
Patient called in for a Rx to get a new mask. Dr Radford Pax did read her sleep study 06/11/2019.  Patient should have followed up with Dr Radford Pax after she got her cpap set up but she never did for reasons unknown. Patient will see her PCP and she will write the Rx for her new mask next week and fax it to [place] and then she will follow up with Dr Radford Pax to manage her OSA on a regular basis.

## 2021-11-02 NOTE — Telephone Encounter (Signed)
I spoke to Lauren Lloyd with Dr. Radford Pax, she reports that patient did not fu with them. And that she needs to be seen with Dr. Radford Pax before they can order her new CPAP.

## 2021-11-02 NOTE — Telephone Encounter (Signed)
Copied from [place] [id number]. Topic: General - Inquiry >> Nov 02, 2021  9:13 AM Erskine Squibb wrote: Reason for CRM: Patient called in stating when she was seen in the ED there was a request for her to get a cpap machine. They told her she would need to get a prescription from her provider for the cpap. Patient is scheduled for a hospital follow up on Monday June 26 but would like a prescription for the cpap prior to her appointment. Please assist patient further.

## 2021-11-02 NOTE — Telephone Encounter (Signed)
Patient advised. She reports that she has not used her CPAP machine for about a year. She was advised by cardiology to restart CPAP. She will come in on the 26th to fu.

## 2021-11-02 NOTE — Telephone Encounter (Signed)
Please see documentation on previous telephone encounter.

## 2021-11-04 ENCOUNTER — Ambulatory Visit: Payer: Medicare HMO | Admitting: Student

## 2021-11-04 ENCOUNTER — Ambulatory Visit: Payer: Medicare HMO

## 2021-11-04 ENCOUNTER — Encounter: Payer: Self-pay | Admitting: Student

## 2021-11-04 VITALS — BP 114/66 | HR 60 | Ht 67.0 in | Wt 214.8 lb

## 2021-11-04 DIAGNOSIS — I5032 Chronic diastolic (congestive) heart failure: Secondary | ICD-10-CM | POA: Diagnosis not present

## 2021-11-04 DIAGNOSIS — I10 Essential (primary) hypertension: Secondary | ICD-10-CM | POA: Diagnosis not present

## 2021-11-04 DIAGNOSIS — I4819 Other persistent atrial fibrillation: Secondary | ICD-10-CM | POA: Diagnosis not present

## 2021-11-04 DIAGNOSIS — I495 Sick sinus syndrome: Secondary | ICD-10-CM | POA: Diagnosis not present

## 2021-11-04 DIAGNOSIS — G4733 Obstructive sleep apnea (adult) (pediatric): Secondary | ICD-10-CM | POA: Diagnosis not present

## 2021-11-04 LAB — CUP PACEART INCLINIC DEVICE CHECK
Date Time Interrogation Session: 20230622115340
Implantable Lead Implant Date: 20230612
Implantable Lead Implant Date: 20230612
Implantable Lead Location: 753859
Implantable Lead Location: 753860
Implantable Lead Model: 7841
Implantable Lead Model: 7842
Implantable Lead Serial Number: 1182863
Implantable Lead Serial Number: 1274488
Implantable Pulse Generator Implant Date: 20230612
Lead Channel Impedance Value: 593 Ohm
Lead Channel Impedance Value: 749 Ohm
Lead Channel Pacing Threshold Amplitude: 0.6 V
Lead Channel Pacing Threshold Amplitude: 1 V
Lead Channel Pacing Threshold Pulse Width: 0.4 ms
Lead Channel Pacing Threshold Pulse Width: 0.4 ms
Lead Channel Sensing Intrinsic Amplitude: 1.5 mV
Lead Channel Sensing Intrinsic Amplitude: 25 mV
Lead Channel Setting Pacing Amplitude: 3.5 V
Lead Channel Setting Pacing Amplitude: 3.5 V
Lead Channel Setting Pacing Pulse Width: 0.4 ms
Lead Channel Setting Sensing Sensitivity: 2.5 mV
Pulse Gen Serial Number: 111954

## 2021-11-04 LAB — BASIC METABOLIC PANEL
BUN/Creatinine Ratio: 25 (ref 12–28)
BUN: 28 mg/dL — ABNORMAL HIGH (ref 8–27)
CO2: 23 mmol/L (ref 20–29)
Calcium: 9.7 mg/dL (ref 8.7–10.3)
Chloride: 102 mmol/L (ref 96–106)
Creatinine, Ser: 1.13 mg/dL — ABNORMAL HIGH (ref 0.57–1.00)
Glucose: 101 mg/dL — ABNORMAL HIGH (ref 70–99)
Potassium: 3.8 mmol/L (ref 3.5–5.2)
Sodium: 140 mmol/L (ref 134–144)
eGFR: 49 mL/min/{1.73_m2} — ABNORMAL LOW (ref >59)

## 2021-11-04 LAB — CBC
Hematocrit: 41.7 % (ref 34.0–46.6)
Hemoglobin: 14.5 g/dL (ref 11.1–15.9)
MCH: 31.3 pg (ref 26.6–33.0)
MCHC: 34.8 g/dL (ref 31.5–35.7)
MCV: 90 fL (ref 79–97)
Platelets: 298 10*3/uL (ref 150–450)
RBC: 4.63 x10E6/uL (ref 3.77–5.28)
RDW: 12.7 % (ref 11.7–15.4)
WBC: 8.6 10*3/uL (ref 3.4–10.8)

## 2021-11-04 MED ORDER — BISOPROLOL FUMARATE 5 MG PO TABS: ORAL_TABLET | ORAL | 3 refills | Status: DC

## 2021-11-04 NOTE — Patient Instructions (Signed)
Medication Instructions:  Your physician has recommended you make the following change in your medication:   INCREASE: Bisoprolol to 7.'5mg'$  daily at bedtime, '5mg'$  daily in the AM  *If you need a refill on your cardiac medications before your next appointment, please call your pharmacy*   Lab Work: TODAY: BMET, CBC  If you have labs (blood work) drawn today and your tests are completely normal, you will receive your results only by: [place] (if you have MyChart) OR A paper copy in the mail If you have any lab test that is abnormal or we need to change your treatment, we will call you to review the results.   Follow-Up: At [facility], you and your health needs are our priority.  As part of our continuing mission to provide you with exceptional heart care, we have created designated Provider Care Teams.  These Care Teams include your primary Cardiologist (physician) and Advanced Practice Providers (APPs -  Physician Assistants and Nurse Practitioners) who all work together to provide you with the care you need, when you need it.  We recommend signing up for the patient portal called "MyChart".  Sign up information is provided on this After Visit Summary.  MyChart is used to connect with patients for Virtual Visits (Telemedicine).  Patients are able to view lab/test results, encounter notes, upcoming appointments, etc.  Non-urgent messages can be sent to your provider as well.   To learn more about what you can do with MyChart, go to [email or web address].    Your next appointment:   As scheduled

## 2021-11-08 ENCOUNTER — Inpatient Hospital Stay: Payer: Medicare HMO | Admitting: Family Medicine

## 2021-11-11 ENCOUNTER — Encounter: Payer: Self-pay | Admitting: Family Medicine

## 2021-11-11 ENCOUNTER — Ambulatory Visit: Payer: Medicare HMO | Admitting: Family Medicine

## 2021-11-11 ENCOUNTER — Ambulatory Visit (INDEPENDENT_AMBULATORY_CARE_PROVIDER_SITE_OTHER): Payer: Medicare HMO | Admitting: Family Medicine

## 2021-11-11 VITALS — BP 110/74 | HR 81 | Temp 98.6°F | Resp 16 | Ht 67.0 in | Wt 219.3 lb

## 2021-11-11 DIAGNOSIS — G4733 Obstructive sleep apnea (adult) (pediatric): Secondary | ICD-10-CM | POA: Diagnosis not present

## 2021-11-11 DIAGNOSIS — I5032 Chronic diastolic (congestive) heart failure: Secondary | ICD-10-CM | POA: Diagnosis not present

## 2021-11-11 DIAGNOSIS — Z950 Presence of cardiac pacemaker: Secondary | ICD-10-CM

## 2021-11-11 DIAGNOSIS — I4819 Other persistent atrial fibrillation: Secondary | ICD-10-CM | POA: Diagnosis not present

## 2021-11-11 NOTE — Progress Notes (Signed)
I,Joseline E Rosas,acting as a scribe for Lauren Durie, MD.,have documented all relevant documentation on the behalf of Lauren Durie, MD,as directed by  Lauren Durie, MD while in the presence of Lauren Durie, MD.   Established patient visit   Patient: Lauren Lloyd   DOB: 1940-08-26   81 y.o. Female  MRN: [id number] Visit Date: 11/11/2021  Today's healthcare provider: [name], MD   Chief Complaint  Patient presents with   Sleep Apnea   Subjective    HPI  Patient is an 81 year old female who presents wanting a CPAP machine.  Patient had sleep study in 2021 that was ordered by her cardiologist.  In her last office visit note she reported that she could not tolerate the machine.  The patient had an emergency room visit and was told that she needed to be using the CPAP machine.  Her cardiologist will not order the machine because she did not follow up with him after the sleep study.  His office states that she will need to re-establish with him.  She apparently is not re-establishing with the cardiologist and requests we order her a CPAP.  She did not like the CPAP because she did not like having to wear the mask  Medications: Outpatient Medications Prior to Visit  Medication Sig   acetaminophen (TYLENOL) 325 MG tablet Take 650 mg by mouth every 6 (six) hours as needed for moderate pain.   apixaban (ELIQUIS) 5 MG TABS tablet Take 1 tablet (5 mg total) by mouth 2 (two) times daily. Re-start 6/14 with evening dose.   bisoprolol (ZEBETA) 5 MG tablet Take '5mg'$  in the morning; Take 7.'5mg'$  at bedtime   calcium carbonate (TUMS EX) 750 MG chewable tablet Chew 2 tablets by mouth daily as needed for heartburn.   Cholecalciferol 25 MCG (1000 UT) tablet Take 1,000 Units by mouth daily.   ezetimibe (ZETIA) 10 MG tablet Take 1 tablet (10 mg total) by mouth daily.   furosemide (LASIX) 40 MG tablet Take 1 tablet (40 mg total) by mouth daily. Take extra 40 mg as needed  after lunch for abdominal swelling, leg swelling, or shortness of breath   Menthol, Topical Analgesic, (BIOFREEZE EX) Apply 1 application. topically daily as needed (Neck pain).   metaxalone (SKELAXIN) 800 MG tablet Take 800 mg by mouth daily as needed for muscle spasms.   Multiple Vitamin (MULTIVITAMIN) capsule Take 1 capsule by mouth daily.   mupirocin ointment (BACTROBAN) 2 % Place 1 application  into the nose 2 (two) times daily.   potassium chloride (KLOR-CON) 10 MEQ tablet TAKE 1 TABLET BY MOUTH DAILY (Patient taking differently: Take 10 mEq by mouth daily.)   Tiotropium Bromide Monohydrate (SPIRIVA RESPIMAT) 2.5 MCG/ACT AERS Inhale 2 puffs into the lungs daily.   No facility-administered medications prior to visit.    Review of Systems  Respiratory:  Positive for shortness of breath.     Last lipids Lab Results  Component Value Date   CHOL CANCELED 08/16/2021   HDL CANCELED 08/16/2021   LDLCALC 188 (H) 08/03/2020   TRIG CANCELED 08/16/2021   CHOLHDL 4.4 08/03/2020       Objective    BP 110/74 (BP Location: Left Arm, Patient Position: Sitting, Cuff Size: Large)   Pulse 81   Temp 98.6 F (37 C) (Oral)   Resp 16   Ht '5\' 7"'$  (1.702 m)   Wt 219 lb 4.8 oz (99.5 kg)   SpO2  94%   BMI 34.35 kg/m  BP Readings from Last 3 Encounters:  11/11/21 110/74  11/04/21 114/66  10/26/21 (!) 145/74   Wt Readings from Last 3 Encounters:  11/11/21 219 lb 4.8 oz (99.5 kg)  11/04/21 214 lb 12.8 oz (97.4 kg)  10/21/21 219 lb 2.2 oz (99.4 kg)      Physical Exam Vitals reviewed.  Constitutional:      General: She is not in acute distress.    Appearance: She is well-developed.  HENT:     Head: Normocephalic and atraumatic.     Right Ear: Hearing normal.     Left Ear: Hearing normal.     Nose: Nose normal.  Eyes:     General: Lids are normal. No scleral icterus.       Right eye: No discharge.        Left eye: No discharge.     Conjunctiva/sclera: Conjunctivae normal.   Cardiovascular:     Rate and Rhythm: Normal rate and regular rhythm.     Heart sounds: Normal heart sounds.  Pulmonary:     Effort: Pulmonary effort is normal. No respiratory distress.  Skin:    Findings: No lesion or rash.  Neurological:     General: No focal deficit present.     Mental Status: She is alert and oriented to person, place, and time.  Psychiatric:        Mood and Affect: Mood normal.        Speech: Speech normal.        Behavior: Behavior normal.        Thought Content: Thought content normal.        Judgment: Judgment normal.      No results found for any visits on 11/11/21.  Assessment & Plan     1. OSA (obstructive sleep apnea) Ordered an auto titrating CPAP machine for patient. She will follow-up in 2 to 3 months to reassess this.  2. Persistent atrial fibrillation ([place]) All other issues per cardiology at this time.  3. Pacemaker Pacemaker in place  4. Chronic diastolic CHF (congestive heart failure) ([place]) Followed routinely by cardiology who recommended that she needed to have the CPAP   No follow-ups on file.      I, Lauren Durie, MD, have reviewed all documentation for this visit. The documentation on 11/13/21 for the exam, diagnosis, procedures, and orders are all accurate and complete.    Richrd Kuzniar Cranford Mon, MD  [facility] [phone] (phone) [phone] (fax)  [place]

## 2021-11-15 ENCOUNTER — Telehealth: Payer: Self-pay

## 2021-11-15 NOTE — Progress Notes (Unsigned)
Chronic Care Management Pharmacy Assistant   Name: Lauren Lloyd  MRN: [id number] DOB: Sep 15, 1940  Reason for Encounter: Hypertension Disease State Call.  Recent office visits:  11/11/2021 Dr. Rosanna Randy MD (PCP Office) No Medication Changes noted, follow-up in 2 to 3 months   Recent consult visits:  11/04/2021 Evert Kohl PA-C (Cardiology) Change Bisoprolol to 5 mg in the morning and 7.5 mg at bedtime 10/18/2021 Clint Fenton PA (Cardiology) Change Bisoprolol to 5 mg 2 times daily  Hospital visits:  Medication Reconciliation was completed by comparing discharge summary, patient's EMR and Pharmacy list, and upon discussion with patient.  Admitted to the hospital on 10/18/2021 due to Atrial Fibrillation. Discharge date was 10/26/2021. Discharged from [place]?Medications Started at [facility] Discharge:?? -started None ID  Medication Changes at Hospital Discharge: -Changed Eliquis 5 mg 2 times daily  Medications Discontinued at Hospital Discharge: -Stopped None ID  Medications that remain the same after Hospital Discharge:??  -All other medications will remain the same.    Medications: Outpatient Encounter Medications as of 11/15/2021  Medication Sig   acetaminophen (TYLENOL) 325 MG tablet Take 650 mg by mouth every 6 (six) hours as needed for moderate pain.   apixaban (ELIQUIS) 5 MG TABS tablet Take 1 tablet (5 mg total) by mouth 2 (two) times daily. Re-start 6/14 with evening dose.   bisoprolol (ZEBETA) 5 MG tablet Take '5mg'$  in the morning; Take 7.'5mg'$  at bedtime   calcium carbonate (TUMS EX) 750 MG chewable tablet Chew 2 tablets by mouth daily as needed for heartburn.   Cholecalciferol 25 MCG (1000 UT) tablet Take 1,000 Units by mouth daily.   ezetimibe (ZETIA) 10 MG tablet Take 1 tablet (10 mg total) by mouth daily.   furosemide (LASIX) 40 MG tablet Take 1 tablet (40 mg total) by mouth daily. Take extra 40 mg as needed after lunch for abdominal  swelling, leg swelling, or shortness of breath   Menthol, Topical Analgesic, (BIOFREEZE EX) Apply 1 application. topically daily as needed (Neck pain).   metaxalone (SKELAXIN) 800 MG tablet Take 800 mg by mouth daily as needed for muscle spasms.   Multiple Vitamin (MULTIVITAMIN) capsule Take 1 capsule by mouth daily.   mupirocin ointment (BACTROBAN) 2 % Place 1 application  into the nose 2 (two) times daily.   potassium chloride (KLOR-CON) 10 MEQ tablet TAKE 1 TABLET BY MOUTH DAILY (Patient taking differently: Take 10 mEq by mouth daily.)   Tiotropium Bromide Monohydrate (SPIRIVA RESPIMAT) 2.5 MCG/ACT AERS Inhale 2 puffs into the lungs daily.   No facility-administered encounter medications on file as of 11/15/2021.      Care Gaps: COVID-19 Vaccine (4- Booster for [employer] series)  Star Rating Drugs: None ID Medication Fill Gaps: None ID   Reviewed chart prior to disease state call. Spoke with patient regarding BP  Recent Office Vitals: BP Readings from Last 3 Encounters:  11/11/21 110/74  11/04/21 114/66  10/26/21 (!) 145/74   Pulse Readings from Last 3 Encounters:  11/11/21 81  11/04/21 60  10/26/21 77    Wt Readings from Last 3 Encounters:  11/11/21 219 lb 4.8 oz (99.5 kg)  11/04/21 214 lb 12.8 oz (97.4 kg)  10/21/21 219 lb 2.2 oz (99.4 kg)     Kidney Function Lab Results  Component Value Date/Time   CREATININE 1.13 (H) 11/04/2021 11:18 AM   CREATININE 0.85 10/25/2021 01:13 AM   CREATININE 0.78 01/19/2017 03:22 PM   GFRNONAA >60 10/25/2021 01:13  AM   GFRNONAA 73 01/19/2017 03:22 PM   GFRAA 75 12/09/2019 02:11 PM   GFRAA 85 01/19/2017 03:22 PM       Latest Ref Rng & Units 11/04/2021   11:18 AM 10/25/2021    1:13 AM 10/23/2021    1:25 AM  BMP  Glucose 70 - 99 mg/dL 101  98  102   BUN 8 - 27 mg/dL '28  22  16   '$ Creatinine 0.57 - 1.00 mg/dL 1.13  0.85  0.79   BUN/Creat Ratio 12 - 28 25     Sodium 134 - 144 mmol/L 140  138  140   Potassium 3.5 - 5.2 mmol/L 3.8  3.9   3.9   Chloride 96 - 106 mmol/L 102  108  106   CO2 20 - 29 mmol/L '23  22  24   '$ Calcium 8.7 - 10.3 mg/dL 9.7  9.1  9.1     Current antihypertensive regimen:  Lasix 40 mg Daily for pulmonary hypertension Bisoprolol 5 mg take 5 mg in the morning and 7.5 mg at bedtime  How often are you checking your Blood Pressure? {CHL HP BP Monitoring [phone]}  Current home BP readings: ***  What recent interventions/DTPs have been made by any provider to improve Blood Pressure control since last CPP Visit:  11/04/2021 Evert Kohl PA-C (Cardiology) Change Bisoprolol to 5 mg in the morning and 7.5 mg at bedtime  Any recent hospitalizations or ED visits since last visit with CPP? Yes  What diet changes have been made to improve Blood Pressure Control?  ***  What exercise is being done to improve your Blood Pressure Control?  ***  Adherence Review: Is the patient currently on ACE/ARB medication? No Does the patient have >5 day gap between last estimated fill dates? No    Telephone follow up appointment with care management team member scheduled for:  01/25/2022 at 3:00 PM [place] Pharmacist Assistant [phone]

## 2021-11-18 ENCOUNTER — Telehealth: Payer: Self-pay | Admitting: *Deleted

## 2021-11-18 NOTE — Telephone Encounter (Addendum)
Pt calling in to follow up on receiving cpap machine. Pt had visit with Dr. Rosanna Randy due to PCP (Dr. B) being on leave. Pt says that she still hasn't heard anything about or has received any assistance with receiving machine.    Please assist pt further.   CB:  [phone]

## 2021-11-18 NOTE — Telephone Encounter (Signed)
Patient left a message she needed a letter sent to [facility] to have her oxygen discharged because she no longer needs the oxygen when using her cpap. Patient has an appointment in August to see Dr Radford Pax but she feels this is far away since she can not sleep at night and feels fatigued. Patient states she will try to get a sooner appointment with Dr Quentin Ore.

## 2021-11-22 NOTE — Telephone Encounter (Signed)
Message send to [place] for Updates

## 2021-11-23 NOTE — Telephone Encounter (Signed)
Per Michail Jewels with [facility] 7/10 ? 2:36PM Looking at this order, the patient currently has an Airsense 10 Auto set on a fixed pressure of 16, that she received on 07/08/2019.  She can take it by the local branch and it can be set to her new auto settings.  Insurance is not going to cover a new machine for her at this time.  I checked [facility] to be sure what machine she had showing in there also.

## 2021-11-23 NOTE — Telephone Encounter (Signed)
Pt called back and was advised of above information, pt stated "they will not touch it without paperwork from the doctor"

## 2021-11-23 NOTE — Telephone Encounter (Signed)
Called patient Lauren Lloyd for [facility] nurse to give the following message.  First, I would like to make sure if the patient has a cpap machine at home? Per [place] "patient currently has an Airsense 10 Auto set on a fixed pressure of 16, that she received on 07/08/2019.  She can take it by the local branch and it can be set to her new auto settings.  Insurance is not going to cover a new machine for her at this time.  I checked [employer] to be sure what machine she had showing in there also."

## 2021-11-24 ENCOUNTER — Ambulatory Visit: Payer: Medicare HMO | Admitting: Physician Assistant

## 2021-11-25 NOTE — Telephone Encounter (Signed)
  Lauren Lloyd 7/12 ? 5:59PM Awaiting Final Review: Order for CPAP Machine, Generic + 9 other items has been received and will be processed shortly [place] 7/12 ? 5:55PM accepted order successfully. [place] 7/12 ? 5:50PM moved the order to [place] 7/12 ? 5:20PM Push order back to me.  I have spoken to someone in the [place] office and she does need an appointment for that store.  I will enter the order to be scheduled as soon as I have access to it again.  Sorry for the confusion.  [place] 7/12 ? 3:56PM '@Lauren Lloyd'$  Lauren Lloyd - I am reaching out to the [facility] store to see if she needs an appointment there or is she can just walk in.  I will let you know as soon as I hear something and will let her know.  Thanks for your patience!!

## 2021-11-25 NOTE — Telephone Encounter (Signed)
Please see message from [facility] about the status.

## 2021-11-26 ENCOUNTER — Telehealth: Payer: Self-pay | Admitting: Cardiovascular Disease

## 2021-11-26 NOTE — Telephone Encounter (Signed)
Pt called back and understands information below, wants to speak to office on Monday she has questions

## 2021-11-26 NOTE — Telephone Encounter (Signed)
   Pre-operative Risk Assessment    Patient Name: Lauren Lloyd  DOB: 02/25/1941 MRN: [id number]      Request for Surgical Clearance    Procedure:   Left Total Hip  Date of Surgery:  Clearance 12/22/21                                 Surgeon:   Surgeon's Group or Practice Name:  [facility]  Phone number:  [phone] Fax number:  [phone]   Type of Clearance Requested:   - Medical  - Pharmacy:  Hold Apixaban (Eliquis) Need instructions for before and after    Type of Anesthesia:  Local    Additional requests/questions:    Signed, April Henson   11/26/2021, 4:17 PM

## 2021-11-29 DIAGNOSIS — M1612 Unilateral primary osteoarthritis, left hip: Secondary | ICD-10-CM | POA: Diagnosis not present

## 2021-11-29 NOTE — Telephone Encounter (Signed)
Patient reports that [facility] [place] called and she has an appointment scheduled tomorrow with the local [place] office. Per [facility] note: [place] 7/17 ? 10:41AM Scheduling Delivery: Order for CPAP Machine, Generic + 9 other items is in the final stages of being processed

## 2021-11-30 DIAGNOSIS — Z961 Presence of intraocular lens: Secondary | ICD-10-CM | POA: Diagnosis not present

## 2021-11-30 DIAGNOSIS — G4733 Obstructive sleep apnea (adult) (pediatric): Secondary | ICD-10-CM | POA: Diagnosis not present

## 2021-11-30 DIAGNOSIS — H26491 Other secondary cataract, right eye: Secondary | ICD-10-CM | POA: Diagnosis not present

## 2021-11-30 NOTE — Telephone Encounter (Signed)
Left message for the pt to call the office for a tele pre op appt

## 2021-11-30 NOTE — Telephone Encounter (Signed)
   Name: Lauren Lloyd  DOB: [birth year]  MRN: [id number]  Primary Cardiologist: Ida Rogue, MD   Preoperative team, please contact this patient and set up a phone call appointment for further preoperative risk assessment. Please obtain consent and complete medication review. Thank you for your help.  Patient was recently in afib when seen by APP.    [name], [place] 11/30/2021, 4:49 PM [place] [address] [place], [place] [address]

## 2021-11-30 NOTE — Telephone Encounter (Signed)
Patient with diagnosis of afib on Eliquis for anticoagulation.    Procedure: left total hip arthroplasty Date of procedure: 12/22/21  CHA2DS2-VASc Score = 6  This indicates a 9.7% annual risk of stroke. [name] score is based upon: CHF History: 1 HTN History: 1 Diabetes History: 0 Stroke History: 0 Vascular Disease History: 1 Age Score: 2 Gender Score: 1   CrCl 65m/min using adjusted body weight due to obesity Platelet count 298K  Per office protocol, patient can hold Eliquis for 3 days prior to procedure. Resume within 48 hours after major surgery as long as pt is clinically stable.  **This guidance is not considered finalized until pre-operative APP has relayed final recommendations.**

## 2021-12-01 ENCOUNTER — Telehealth: Payer: Self-pay | Admitting: *Deleted

## 2021-12-01 NOTE — Telephone Encounter (Signed)
Pt agreeable to plan of care for tele visit for pre op clearance 12/08/21 @ 9:20. Med rec and consent are done.

## 2021-12-01 NOTE — Telephone Encounter (Signed)
Pt agreeable to plan of care for tele visit for pre op clearance 12/08/21 @ 9:20. Med rec and consent are done.      Patient Consent for Virtual Visit        Lauren Lloyd has provided verbal consent on 12/01/2021 for a virtual visit (video or telephone).   CONSENT FOR VIRTUAL VISIT FOR:  Lauren Lloyd  By participating in this virtual visit I agree to the following:  I hereby voluntarily request, consent and authorize [facility] and its employed or contracted physicians, physician assistants, nurse practitioners or other licensed health care professionals (the Practitioner), to provide me with telemedicine health care services (the "Services") as deemed necessary by the treating Practitioner. I acknowledge and consent to receive the Services by the Practitioner via telemedicine. I understand that the telemedicine visit will involve communicating with the Practitioner through live audiovisual communication technology and the disclosure of certain medical information by electronic transmission. I acknowledge that I have been given the opportunity to request an in-person assessment or other available alternative prior to the telemedicine visit and am voluntarily participating in the telemedicine visit.  I understand that I have the right to withhold or withdraw my consent to the use of telemedicine in the course of my care at any time, without affecting my right to future care or treatment, and that the Practitioner or I may terminate the telemedicine visit at any time. I understand that I have the right to inspect all information obtained and/or recorded in the course of the telemedicine visit and may receive copies of available information for a reasonable fee.  I understand that some of the potential risks of receiving the Services via telemedicine include:  Delay or interruption in medical evaluation due to technological equipment failure or disruption; Information transmitted may not be  sufficient (e.g. poor resolution of images) to allow for appropriate medical decision making by the Practitioner; and/or  In rare instances, security protocols could fail, causing a breach of personal health information.  Furthermore, I acknowledge that it is my responsibility to provide information about my medical history, conditions and care that is complete and accurate to the best of my ability. I acknowledge that Practitioner's advice, recommendations, and/or decision may be based on factors not within their control, such as incomplete or inaccurate data provided by me or distortions of diagnostic images or specimens that may result from electronic transmissions. I understand that the practice of medicine is not an exact science and that Practitioner makes no warranties or guarantees regarding treatment outcomes. I acknowledge that a copy of this consent can be made available to me via my patient portal ([facility]), or I can request a printed copy by calling the office of [facility].    I understand that my insurance will be billed for this visit.   I have read or had this consent read to me. I understand the contents of this consent, which adequately explains the benefits and risks of the Services being provided via telemedicine.  I have been provided ample opportunity to ask questions regarding this consent and the Services and have had my questions answered to my satisfaction. I give my informed consent for the services to be provided through the use of telemedicine in my medical care

## 2021-12-02 DIAGNOSIS — I495 Sick sinus syndrome: Secondary | ICD-10-CM | POA: Diagnosis not present

## 2021-12-02 DIAGNOSIS — Z950 Presence of cardiac pacemaker: Secondary | ICD-10-CM | POA: Diagnosis not present

## 2021-12-02 DIAGNOSIS — K219 Gastro-esophageal reflux disease without esophagitis: Secondary | ICD-10-CM | POA: Diagnosis not present

## 2021-12-02 DIAGNOSIS — I779 Disorder of arteries and arterioles, unspecified: Secondary | ICD-10-CM | POA: Diagnosis not present

## 2021-12-02 DIAGNOSIS — I5032 Chronic diastolic (congestive) heart failure: Secondary | ICD-10-CM | POA: Diagnosis not present

## 2021-12-02 DIAGNOSIS — I4819 Other persistent atrial fibrillation: Secondary | ICD-10-CM | POA: Diagnosis not present

## 2021-12-02 DIAGNOSIS — E669 Obesity, unspecified: Secondary | ICD-10-CM | POA: Diagnosis not present

## 2021-12-02 DIAGNOSIS — Z7901 Long term (current) use of anticoagulants: Secondary | ICD-10-CM | POA: Diagnosis not present

## 2021-12-02 DIAGNOSIS — I272 Pulmonary hypertension, unspecified: Secondary | ICD-10-CM | POA: Diagnosis not present

## 2021-12-02 DIAGNOSIS — M1612 Unilateral primary osteoarthritis, left hip: Secondary | ICD-10-CM | POA: Diagnosis not present

## 2021-12-02 DIAGNOSIS — Z01818 Encounter for other preprocedural examination: Secondary | ICD-10-CM | POA: Diagnosis not present

## 2021-12-02 DIAGNOSIS — I10 Essential (primary) hypertension: Secondary | ICD-10-CM | POA: Diagnosis not present

## 2021-12-02 DIAGNOSIS — E559 Vitamin D deficiency, unspecified: Secondary | ICD-10-CM | POA: Diagnosis not present

## 2021-12-02 DIAGNOSIS — G4733 Obstructive sleep apnea (adult) (pediatric): Secondary | ICD-10-CM | POA: Diagnosis not present

## 2021-12-06 NOTE — Telephone Encounter (Signed)
I s/w the anesthesiologist Candice who was calling checking on clearance. I did inform Candice the pt has a tele pre op appt 12/08/21. Candice did ask if we could please fax the clearance notes over to anesthesiology as well; fax # given today [phone]. I assured Candice that we will fax notes to all parties involved .

## 2021-12-06 NOTE — Telephone Encounter (Signed)
Caller is following up on the patient's pre-op clearance.    Pre-operative Risk Assessment    Patient Name: Lauren Lloyd  DOB: 07/18/1940 MRN: [id number]     Request for Surgical Clearance    Procedure:  Total hip replacement  Date of Surgery:  Clearance 12/22/21                                 Surgeon:    Dr. Erby Pian Surgeon's Group or Practice Name:  [facility]  Phone number:  [phone] Fax number:  [phone]   Type of Clearance Requested:   - Medical    Type of Anesthesia:  TBD   Additional requests/questions:    Caller stated patient will need to hold her Eliquis.   Signed, Heloise Beecham   12/06/2021, 11:24 AM

## 2021-12-08 ENCOUNTER — Ambulatory Visit (INDEPENDENT_AMBULATORY_CARE_PROVIDER_SITE_OTHER): Payer: Medicare HMO | Admitting: Physician Assistant

## 2021-12-08 DIAGNOSIS — Z01810 Encounter for preprocedural cardiovascular examination: Secondary | ICD-10-CM

## 2021-12-08 NOTE — Telephone Encounter (Signed)
I am unable to clear the patient over the phone given dyspnea with minimal activity.  She clearly cannot accomplish more than 4 METS of activity.  Would recommend an in office visit for formal cardiac clearance with either Dr. Rockey Situ or his APP

## 2021-12-08 NOTE — Telephone Encounter (Signed)
See below, pharmacy has already commented.

## 2021-12-08 NOTE — Telephone Encounter (Signed)
Pt has been scheduled for IN OFFICE appt 12/17/21, see notes from pre op provider Almyra Deforest, PAC. Pt agreeable to plan of care. I will send FYI to requesting office the pt has IN OFFICE appt due to dyspnea.

## 2021-12-08 NOTE — Progress Notes (Signed)
Virtual Visit via Telephone Note   Because of Lauren Lloyd's co-morbid illnesses, she is at least at moderate risk for complications without adequate follow up.  This format is felt to be most appropriate for this patient at this time.  The patient did not have access to video technology/had technical difficulties with video requiring transitioning to audio format only (telephone).  All issues noted in this document were discussed and addressed.  No physical exam could be performed with this format.  Please refer to the patient's chart for her consent to telehealth for [facility].  Evaluation Performed:  Preoperative cardiovascular risk assessment _____________   Date:  12/08/2021   Patient ID:  Lauren Lloyd, Lauren Lloyd 1940/07/21, MRN [id number] Patient Location:  Home Provider location:   Office  Primary Care Provider:  [name], MD Primary Cardiologist:  Lauren Rogue, MD  Chief Complaint / Patient Profile   81 y.o. y/o female with a h/o aortic atherosclerosis seen on CT (normal Myoview 07/2018), chronic lower extremity swelling, diastolic heart failure, PAF s/p DCCV 06/16/2019 and 02/2021, carotid artery disease, and OSA who is pending total hip replacement and presents today for telephonic preoperative cardiovascular risk assessment.  Past Medical History    Past Medical History:  Diagnosis Date   (HFpEF) heart failure with preserved ejection fraction ([place])    a. 05/2018 Echo: EF 55-60%, no rwma, mild to mod MR. Nl RV fxn. Mod TR. PASP 37mHg.   Arthritis    knees, Hands   Arthritis of knee    Back pain    Carotid arterial disease ([place]    a. 03/2019 Carotid U/S: <50% bilat ICA stenoses.   Cholelithiasis    a. 10/2018 noted on CT.   Edema, lower extremity    Fatty liver    GERD (gastroesophageal reflux disease)    History of stress test    a. 06/2018 MV: EF 59%, no ischemia/infarct. Low risk.   Knee pain    Lactose intolerance    Mitral regurgitation    a.  05/2018 Echo: mild to mod MR.   Multinodular goiter    Obesity    OSA (obstructive sleep apnea)    [place]F ([place]oxysmal atrial fibrillation) ([place]    a.  Diagnosed 12/19; b. 05/2018 s/p DCCV; c. 03/2019 & 05/2019 recurrent AFib-->managed w/ amio load; d. CHADS2VASc = 6 (CHF, HTN, age x 2, vascular disease, female)-->Eliquis & amio 100 qd.   PAH (pulmonary artery hypertension) (HCC)    Scoliosis    SOB (shortness of breath)    Swallowing difficulty    Past Surgical History:  Procedure Laterality Date   CARDIOVERSION N/A 06/15/2018   Procedure: CARDIOVERSION (CATH LAB);  Surgeon: GMinna Merritts MD;  Location: [facility] ORS;  Service: Cardiovascular;  Laterality: N/A;   CARDIOVERSION N/A 02/18/2021   Procedure: CARDIOVERSION;  Surgeon: GMinna Merritts MD;  Location: [facility] ORS;  Service: Cardiovascular;  Laterality: N/A;   CATARACT EXTRACTION W/PHACO Right 01/25/2016   Procedure: CATARACT EXTRACTION PHACO AND INTRAOCULAR LENS PLACEMENT ([place];  Surgeon: ARonnell Freshwater MD;  [place]  Service: Ophthalmology;  Laterality: Right;  RIGHT   CATARACT EXTRACTION W/PHACO Left 02/22/2016   Procedure: CATARACT EXTRACTION PHACO AND INTRAOCULAR LENS PLACEMENT ([place];  Surgeon: ARonnell Freshwater MD;  [place]  Service: Ophthalmology;  Laterality: Left;  LEFT   HAMMER TOE SURGERY  05/16/2008   KNEE ARTHROSCOPY Right 05/16/2002   PACEMAKER IMPLANT N/A 10/25/2021   Procedure: PACEMAKER IMPLANT;  Surgeon: Lauren Epley, MD;  Location: [place] CV LAB;  Service: Cardiovascular;  Laterality: N/A;   REPLACEMENT TOTAL KNEE Right 05/17/2007   [facility]   SKIN GRAFT Left 04/08/2013   Done on left index finger   TONSILLECTOMY  05/16/1944   TOTAL KNEE ARTHROPLASTY Left 12/02/2020    Allergies  Allergies  Allergen Reactions   Levofloxacin     Other reaction(s): Joint Pains    Influenza Vaccines Other (See Comments)    Bell's  Palsy   Oysters [Shellfish Allergy] Swelling    She states she had eaten them three days in a row and she developed swelling around her eyes.     History of Present Illness    Lauren Lloyd is a 81 y.o. female who presents via audio/video conferencing for a telehealth visit today.  Pt was last seen in cardiology clinic on 11/04/2021 by Lauren July PA-C.  At that time Lauren Lloyd was doing well.  The patient is now pending procedure as outlined above.  Patient was then recently admitted to the hospital for Tikosyn loading, but this ultimately had to be stopped due to VTE.  She was also noted to have tachybradycardia syndrome and underwent implantation of [employer] dual-chamber PPM after Tikosyn washout.  Since her last visit, she has dyspnea with minimal activity.  She clearly cannot accomplish more than 4 METS of activity.  She denies any recent chest pain.  Dyspnea seems to be chronic however she says it may or may not be a little bit worse when compared to the end of last year.  I feel the patient will need a in office visit for formal cardiac clearance.   Home Medications    Prior to Admission medications   Medication Sig Start Date End Date Taking? Authorizing Provider  acetaminophen (TYLENOL) 325 MG tablet Take 650 mg by mouth every 6 (six) hours as needed for moderate pain.    [provider]  apixaban (ELIQUIS) 5 MG TABS tablet Take 1 tablet (5 mg total) by mouth 2 (two) times daily. Re-start 6/14 with evening dose. 10/27/21   Lauren Friar, PA-C  bisoprolol (ZEBETA) 5 MG tablet Take '5mg'$  in the morning; Take 7.'5mg'$  at bedtime 11/04/21   Lauren Friar, PA-C  calcium carbonate (TUMS EX) 750 MG chewable tablet Chew 2 tablets by mouth daily as needed for heartburn. 12/07/20   [provider]  Cholecalciferol 25 MCG (1000 UT) tablet Take 1,000 Units by mouth daily.    [provider]  ezetimibe (ZETIA) 10 MG tablet Take 1 tablet (10 mg  total) by mouth daily. 08/16/21   Lloyd, Lauren Bucy, MD  furosemide (LASIX) 40 MG tablet Take 1 tablet (40 mg total) by mouth daily. Take extra 40 mg as needed after lunch for abdominal swelling, leg swelling, or shortness of breath 08/25/21   Minna Merritts, MD  Menthol, Topical Analgesic, (BIOFREEZE EX) Apply 1 application. topically daily as needed (Neck pain).    [provider]  metaxalone (SKELAXIN) 800 MG tablet Take 800 mg by mouth daily as needed for muscle spasms.    [provider]  Multiple Vitamin (MULTIVITAMIN) capsule Take 1 capsule by mouth daily.    [provider]  mupirocin ointment (BACTROBAN) 2 % Place 1 application  into the nose 2 (two) times daily. 10/26/21   Lauren Friar, PA-C  potassium chloride (KLOR-CON) 10 MEQ tablet TAKE 1 TABLET BY MOUTH DAILY Patient taking differently:  Take 10 mEq by mouth daily. 10/14/21   Minna Merritts, MD  Tiotropium Bromide Monohydrate (SPIRIVA RESPIMAT) 2.5 MCG/ACT AERS Inhale 2 puffs into the lungs daily. 08/16/21   Lauren Crews, MD    Physical Exam    Vital Signs:  Lauren Lloyd does not have vital signs available for review today.  Given telephonic nature of communication, physical exam is limited. AAOx3. NAD. Normal affect.  Speech and respirations are unlabored.  Accessory Clinical Findings    None  Assessment & Plan    1.  Preoperative Cardiovascular Risk Assessment:  -Patient pending upcoming total hip replacement surgery on 12/22/2021 by Dr. Erby Pian.  She recently was admitted to the hospital for Tikosyn loading, this was complicated by VT, therefore Tikosyn was discontinued.  She was noted to have tacky-brady syndrome requiring placement of pacemaker.  Talking with the patient today, she denies any recent chest pain, however her shortness of breath which she had last year is still quite significant.  She develops shortness of breath with minimal exertion.  She is not sure if her  shortness of breath is a little worse or about the same as last year.  Last nuclear stress test obtained on 07/12/2018 showed EF 59%, low risk study.  Unfortunately, I am unable to clear the patient over the phone and advised in office visit for final clearance.  A copy of this note will be routed to requesting surgeon.  Time:   Today, I have spent 5 minutes with the patient with telehealth technology discussing medical history, symptoms, and management plan.     [place], [place]  12/08/2021, 9:30 AM

## 2021-12-08 NOTE — Telephone Encounter (Signed)
Clinical pharmacist to review Eliquis 

## 2021-12-16 NOTE — Progress Notes (Signed)
Date:  12/17/2021   ID:  Lauren Lloyd, DOB 09/07/1940, MRN [id number]  Patient Location:  [place] [address]   Provider location:   [facility] [place] [facility]  PCP:  [name], MD  Cardiologist:  Arvid Right [facility]  Chief Complaint  Patient presents with   Pre op clearance for left hip replacement     Patient c/o LE edema & shortness of breath with exertion. Patient would like to know what to take for neck/muscle pain in her neck. Medications reviewed by the patient verbally.     History of Present Illness:    Lauren Lloyd is a 81 y.o. female  past medical history of Aortic atherosclerosis on CT scan 2020 hypertension,  asthma  Chronic lower extremity swelling Hospital admission for atrial fibrillation December 2019 Associated acute diastolic CHF Status post successful DCCV on 06/15/2018.  NSR on 06/2018 stress test, 07/2018 Showing no significant ischemia Mild lower extremity swelling, chronic atrial fibrillation started June 2022 after her knee surgery Who presents for hospital follow-up, follow-up of her atrial fibrillation and acute on chronic diastolic CHF  Last seen in clinic 12/22  at that time noted to be in atrial fibrillation cardioversion on February 18, 2021.  Was referred by one of our providers to EP Amiodarone was held, March 31, 2021 Planned f/u in feb 2022  Last seen by EP November 04, 2021 Now on Tikosyn though this was held secondary to [place] PPM implanted 10/25/2021 for tachy brady Last download noted to be in atrial fibrillation Her bisoprolol was increased 7.5 mg evening, 5 mg morning She elected to pursue rate control Not interested in AV nodal ablation  Having neck discomfort since pacemaker was placed, seeing chiropractor and massage therapist  Still with SOB on exertion Rarely takes extra Lasix Lab work reviewed, stable renal function and potassium  On CPAP,  mask fits better  Hip surgery on left next week  EKG personally reviewed by myself on todays visit Atrial fibrillation ventricular rate 92 bpm with PVCs  Other past medical history reviewed History of back pain, has received cortisone injections Followed by Dr. Sharlet Salina   Nov 2020, developed atrial fib  stress test, 07/2018 Showing no significant ischemia normal ejection fraction normal perfusion  Presented to the hospital 04/2018 with worsening shortness of breath, leg swelling, weight gain, abdominal bloating and new atrial fibrillation with RVR Initially on Cardizem infusion, changed to oral Cardizem with titration upwards Discharged on Cardizem CD 300 mg and metoprolol 25 mg bid, Eliquis. Treated with IV Lasix during her hospital course with marked improvement in her shortness of breath   Carotid ultrasound January 2020 Less than 39% disease bilaterally   Past Medical History:  Diagnosis Date   (HFpEF) heart failure with preserved ejection fraction ([place])    a. 05/2018 Echo: EF 55-60%, no rwma, mild to mod MR. Nl RV fxn. Mod TR. PASP 70mHg.   Arthritis    knees, Hands   Arthritis of knee    Back pain    Carotid arterial disease ([place]    a. 03/2019 Carotid U/S: <50% bilat ICA stenoses.   Cholelithiasis    a. 10/2018 noted on CT.   Edema, lower extremity    Fatty liver    GERD (gastroesophageal reflux disease)    History of stress test    a. 06/2018 MV: EF 59%, no ischemia/infarct. Low risk.   Knee pain  Lactose intolerance    Mitral regurgitation    a. 05/2018 Echo: mild to mod MR.   Multinodular goiter    Obesity    OSA (obstructive sleep apnea)    PAF (paroxysmal atrial fibrillation) ([place])    a.  Diagnosed 12/19; b. 05/2018 s/p DCCV; c. 03/2019 & 05/2019 recurrent AFib-->managed w/ amio load; d. CHADS2VASc = 6 (CHF, HTN, age x 2, vascular disease, female)-->Eliquis & amio 100 qd.   PAH (pulmonary artery hypertension) (HCC)    Scoliosis    SOB (shortness of breath)     Swallowing difficulty    Past Surgical History:  Procedure Laterality Date   CARDIOVERSION N/A 06/15/2018   Procedure: CARDIOVERSION (CATH LAB);  Surgeon: Minna Merritts, MD;  Location: [facility] ORS;  Service: Cardiovascular;  Laterality: N/A;   CARDIOVERSION N/A 02/18/2021   Procedure: CARDIOVERSION;  Surgeon: Minna Merritts, MD;  Location: [facility] ORS;  Service: Cardiovascular;  Laterality: N/A;   CATARACT EXTRACTION W/PHACO Right 01/25/2016   Procedure: CATARACT EXTRACTION PHACO AND INTRAOCULAR LENS PLACEMENT ([place]);  Surgeon: Ronnell Freshwater, MD;  Location: [place];  Service: Ophthalmology;  Laterality: Right;  RIGHT   CATARACT EXTRACTION W/PHACO Left 02/22/2016   Procedure: CATARACT EXTRACTION PHACO AND INTRAOCULAR LENS PLACEMENT ([place]);  Surgeon: Ronnell Freshwater, MD;  Location: [place];  Service: Ophthalmology;  Laterality: Left;  LEFT   HAMMER TOE SURGERY  05/16/2008   KNEE ARTHROSCOPY Right 05/16/2002   PACEMAKER IMPLANT N/A 10/25/2021   Procedure: PACEMAKER IMPLANT;  Surgeon: Vickie Epley, MD;  Location: [place] CV LAB;  Service: Cardiovascular;  Laterality: N/A;   REPLACEMENT TOTAL KNEE Right 05/17/2007   [facility]   SKIN GRAFT Left 04/08/2013   Done on left index finger   TONSILLECTOMY  05/16/1944   TOTAL KNEE ARTHROPLASTY Left 12/02/2020     Current Meds  Medication Sig   acetaminophen (TYLENOL) 325 MG tablet Take 650 mg by mouth every 6 (six) hours as needed for moderate pain.   apixaban (ELIQUIS) 5 MG TABS tablet Take 1 tablet (5 mg total) by mouth 2 (two) times daily. Re-start 6/14 with evening dose.   bisoprolol (ZEBETA) 5 MG tablet Take '5mg'$  in the morning; Take 7.'5mg'$  at bedtime   calcium carbonate (TUMS EX) 750 MG chewable tablet Chew 2 tablets by mouth daily as needed for heartburn.   Cholecalciferol 25 MCG (1000 UT) tablet Take 1,000 Units by mouth daily.   ezetimibe (ZETIA) 10 MG tablet Take 1  tablet (10 mg total) by mouth daily.   furosemide (LASIX) 40 MG tablet Take 1 tablet (40 mg total) by mouth daily. Take extra 40 mg as needed after lunch for abdominal swelling, leg swelling, or shortness of breath   Menthol, Topical Analgesic, (BIOFREEZE EX) Apply 1 application. topically daily as needed (Neck pain).   metaxalone (SKELAXIN) 800 MG tablet Take 800 mg by mouth daily as needed for muscle spasms.   Multiple Vitamin (MULTIVITAMIN) capsule Take 1 capsule by mouth daily.   mupirocin ointment (BACTROBAN) 2 % Place 1 application  into the nose 2 (two) times daily.   potassium chloride (KLOR-CON) 10 MEQ tablet TAKE 1 TABLET BY MOUTH DAILY   Tiotropium Bromide Monohydrate (SPIRIVA RESPIMAT) 2.5 MCG/ACT AERS Inhale 2 puffs into the lungs daily.     Allergies:   Levofloxacin, Influenza vac recombinant ha trivalent [influenza vaccine recombinant], Influenza vaccines, Other, and Oysters [shellfish allergy]   Social History   Tobacco Use   Smoking  status: Former    Packs/day: 1.00    Years: 30.00    Total pack years: 30.00    Types: Cigarettes    Quit date: 05/16/1989    Years since quitting: 32.6   Smokeless tobacco: Never  Vaping Use   Vaping Use: Never used  Substance Use Topics   Alcohol use: Yes    Alcohol/week: 7.0 standard drinks of alcohol    Types: 7 Glasses of wine per week    Comment: 0-2 a night   Drug use: No     Family Hx: [name] family history includes Atrial fibrillation in her sister and sister; Breast cancer (age of onset: 3) in her sister; Healthy in her brother; Heart attack in her father; Heart disease in her mother; High blood pressure in her father; Hyperlipidemia in her sister and sister; Stroke in her father; Transient ischemic attack in her mother.  ROS:   Please see the history of present illness.    Review of Systems  Constitutional: Negative.   HENT: Negative.    Respiratory:  Positive for shortness of breath.   Cardiovascular: Negative.    Gastrointestinal: Negative.   Musculoskeletal:  Positive for joint pain.  Neurological: Negative.   Psychiatric/Behavioral: Negative.    All other systems reviewed and are negative.    Labs/Other Tests and Data Reviewed:    Recent Labs: 08/16/2021: ALT CANCELED 10/22/2021: Magnesium 2.5 11/04/2021: BUN 28; Creatinine, Ser 1.13; Hemoglobin 14.5; Platelets 298; Potassium 3.8; Sodium 140   Recent Lipid Panel Lab Results  Component Value Date/Time   CHOL CANCELED 08/16/2021 08:50 AM   TRIG CANCELED 08/16/2021 08:50 AM   HDL CANCELED 08/16/2021 08:50 AM   CHOLHDL 4.4 08/03/2020 10:12 AM   CHOLHDL 3.6 05/31/2018 04:52 AM   LDLCALC 188 (H) 08/03/2020 10:12 AM    Wt Readings from Last 3 Encounters:  12/17/21 226 lb 4 oz (102.6 kg)  11/11/21 219 lb 4.8 oz (99.5 kg)  11/04/21 214 lb 12.8 oz (97.4 kg)     Exam:    BP 130/80 (BP Location: Left Arm, Patient Position: Sitting, Cuff Size: Normal)   Pulse 92   Ht 5' 7.5" (1.715 m)   Wt 226 lb 4 oz (102.6 kg)   SpO2 98%   BMI 34.91 kg/m  Constitutional:  oriented to person, place, and time. No distress.  HENT:  Head: Grossly normal Eyes:  no discharge. No scleral icterus.  Neck: No JVD, no carotid bruits  Cardiovascular: Irregularly irregular, no murmurs appreciated Pulmonary/Chest: Clear to auscultation bilaterally, no wheezes or rails Abdominal: Soft.  no distension.  no tenderness.  Musculoskeletal: Normal range of motion Neurological:  normal muscle tone. Coordination normal. No atrophy Skin: Skin warm and dry Psychiatric: normal affect, pleasant    ASSESSMENT & PLAN:    Preop cardiovascular evaluation left hip surgery at [facility] Acceptable risk, no further testing needed Hold Eliquis 2 days prior to procedure Would minimize IV fluids during the procedure given history of CHF  Paroxysmal atrial fibrillation (HCC) -  Remains in atrial fibrillation Followed by EP, off amiodarone, now off Tikosyn Tolerating Eliquis,  bisoprolol for rate control  Shortness of breath Likely multifactorial including underlying lung disease, A-fib, diastolic CHF  Chronic diastolic CHF (congestive heart failure) (HCC) Exacerbated by atrial fibrillation with rapid rate Lasix 40 daily, take rare extra Lasix after lunch for worsening leg swelling  Leg edema We will avoid calcium channel blockers Stable symptoms on Lasix, stable renal function   Centrilobular emphysema (HCC)  Prior history of smoking Followed by pulmonary, She does have a cough likely resolving URI  Sleep apnea CPAP mask fitting better, now compliant  Obesity We have encouraged continued exercise, careful diet management in an effort to lose weight.  Pulmonary hypertension, unspecified ([place]) Moderate pulmonary hypertension seen on prior echocardiogram Likely exacerbated by atrial fibrillation and CHF, obesity Lasix daily  Back pain, knee pain Stable   Total encounter time more than 30 minutes  Greater than 50% was spent in counseling and coordination of care with the patient     Signed, Ida Rogue, MD  12/17/2021 10:37 AM    [place] Office [address], [place], [place] [address]

## 2021-12-17 ENCOUNTER — Encounter: Payer: Self-pay | Admitting: Cardiovascular Disease

## 2021-12-17 ENCOUNTER — Ambulatory Visit: Payer: Medicare HMO | Admitting: Cardiovascular Disease

## 2021-12-17 VITALS — BP 130/80 | HR 92 | Ht 67.5 in | Wt 226.2 lb

## 2021-12-17 DIAGNOSIS — I10 Essential (primary) hypertension: Secondary | ICD-10-CM | POA: Diagnosis not present

## 2021-12-17 DIAGNOSIS — I4819 Other persistent atrial fibrillation: Secondary | ICD-10-CM | POA: Diagnosis not present

## 2021-12-17 DIAGNOSIS — J432 Centrilobular emphysema: Secondary | ICD-10-CM | POA: Diagnosis not present

## 2021-12-17 DIAGNOSIS — I495 Sick sinus syndrome: Secondary | ICD-10-CM | POA: Diagnosis not present

## 2021-12-17 DIAGNOSIS — I5032 Chronic diastolic (congestive) heart failure: Secondary | ICD-10-CM

## 2021-12-17 DIAGNOSIS — D6869 Other thrombophilia: Secondary | ICD-10-CM

## 2021-12-17 DIAGNOSIS — G4733 Obstructive sleep apnea (adult) (pediatric): Secondary | ICD-10-CM | POA: Diagnosis not present

## 2021-12-17 HISTORY — PX: PARTIAL HIP ARTHROPLASTY: SHX733

## 2021-12-17 NOTE — Patient Instructions (Addendum)
Medication Instructions:  Stop eliquis Monday for surgery Wednesday at [facility]   If you need a refill on your cardiac medications before your next appointment, please call your pharmacy.   Lab work: No new labs needed  Testing/Procedures: No new testing needed  Follow-Up: At [facility], you and your health needs are our priority.  As part of our continuing mission to provide you with exceptional heart care, we have created designated Provider Care Teams.  These Care Teams include your primary Cardiologist (physician) and Advanced Practice Providers (APPs -  Physician Assistants and Nurse Practitioners) who all work together to provide you with the care you need, when you need it.  You will need a follow up appointment in 6 months  Providers on your designated Care Team:   Murray Hodgkins, NP Christell Faith, PA-C Cadence Kathlen Mody, [place]  COVID-19 Vaccine Information can be found at: [email or web address].uk For questions related to vaccine distribution or appointments, please email [email or web address] or call [phone].

## 2021-12-22 ENCOUNTER — Encounter (INDEPENDENT_AMBULATORY_CARE_PROVIDER_SITE_OTHER): Payer: Self-pay

## 2021-12-22 DIAGNOSIS — I272 Pulmonary hypertension, unspecified: Secondary | ICD-10-CM | POA: Diagnosis not present

## 2021-12-22 DIAGNOSIS — G8929 Other chronic pain: Secondary | ICD-10-CM | POA: Diagnosis not present

## 2021-12-22 DIAGNOSIS — M66352 Spontaneous rupture of flexor tendons, left thigh: Secondary | ICD-10-CM | POA: Diagnosis not present

## 2021-12-22 DIAGNOSIS — M545 Low back pain, unspecified: Secondary | ICD-10-CM | POA: Diagnosis not present

## 2021-12-22 DIAGNOSIS — K219 Gastro-esophageal reflux disease without esophagitis: Secondary | ICD-10-CM | POA: Diagnosis not present

## 2021-12-22 DIAGNOSIS — Z87891 Personal history of nicotine dependence: Secondary | ICD-10-CM | POA: Diagnosis not present

## 2021-12-22 DIAGNOSIS — I509 Heart failure, unspecified: Secondary | ICD-10-CM | POA: Diagnosis not present

## 2021-12-22 DIAGNOSIS — I495 Sick sinus syndrome: Secondary | ICD-10-CM | POA: Diagnosis not present

## 2021-12-22 DIAGNOSIS — Z888 Allergy status to other drugs, medicaments and biological substances status: Secondary | ICD-10-CM | POA: Diagnosis not present

## 2021-12-22 DIAGNOSIS — M6588 Other synovitis and tenosynovitis, other site: Secondary | ICD-10-CM | POA: Diagnosis not present

## 2021-12-22 DIAGNOSIS — Z20822 Contact with and (suspected) exposure to covid-19: Secondary | ICD-10-CM | POA: Diagnosis not present

## 2021-12-22 DIAGNOSIS — I110 Hypertensive heart disease with heart failure: Secondary | ICD-10-CM | POA: Diagnosis not present

## 2021-12-22 DIAGNOSIS — I4819 Other persistent atrial fibrillation: Secondary | ICD-10-CM | POA: Diagnosis not present

## 2021-12-22 DIAGNOSIS — I5032 Chronic diastolic (congestive) heart failure: Secondary | ICD-10-CM | POA: Diagnosis not present

## 2021-12-22 DIAGNOSIS — Z7901 Long term (current) use of anticoagulants: Secondary | ICD-10-CM | POA: Diagnosis not present

## 2021-12-22 DIAGNOSIS — G4730 Sleep apnea, unspecified: Secondary | ICD-10-CM | POA: Diagnosis not present

## 2021-12-22 DIAGNOSIS — Z96653 Presence of artificial knee joint, bilateral: Secondary | ICD-10-CM | POA: Diagnosis not present

## 2021-12-22 DIAGNOSIS — I739 Peripheral vascular disease, unspecified: Secondary | ICD-10-CM | POA: Diagnosis not present

## 2021-12-22 DIAGNOSIS — M1612 Unilateral primary osteoarthritis, left hip: Secondary | ICD-10-CM | POA: Diagnosis not present

## 2021-12-22 DIAGNOSIS — I2510 Atherosclerotic heart disease of native coronary artery without angina pectoris: Secondary | ICD-10-CM | POA: Diagnosis not present

## 2021-12-22 DIAGNOSIS — G8918 Other acute postprocedural pain: Secondary | ICD-10-CM | POA: Diagnosis not present

## 2021-12-22 DIAGNOSIS — G4733 Obstructive sleep apnea (adult) (pediatric): Secondary | ICD-10-CM | POA: Diagnosis not present

## 2021-12-22 DIAGNOSIS — Z881 Allergy status to other antibiotic agents status: Secondary | ICD-10-CM | POA: Diagnosis not present

## 2021-12-22 DIAGNOSIS — Z96642 Presence of left artificial hip joint: Secondary | ICD-10-CM | POA: Diagnosis not present

## 2021-12-22 DIAGNOSIS — Z950 Presence of cardiac pacemaker: Secondary | ICD-10-CM | POA: Diagnosis not present

## 2021-12-22 DIAGNOSIS — Z8616 Personal history of COVID-19: Secondary | ICD-10-CM | POA: Diagnosis not present

## 2021-12-22 DIAGNOSIS — E7800 Pure hypercholesterolemia, unspecified: Secondary | ICD-10-CM | POA: Diagnosis not present

## 2021-12-22 DIAGNOSIS — Z79899 Other long term (current) drug therapy: Secondary | ICD-10-CM | POA: Diagnosis not present

## 2021-12-22 DIAGNOSIS — E669 Obesity, unspecified: Secondary | ICD-10-CM | POA: Diagnosis not present

## 2021-12-22 DIAGNOSIS — M65862 Other synovitis and tenosynovitis, left lower leg: Secondary | ICD-10-CM | POA: Diagnosis not present

## 2021-12-28 DIAGNOSIS — Z471 Aftercare following joint replacement surgery: Secondary | ICD-10-CM | POA: Diagnosis not present

## 2021-12-28 DIAGNOSIS — I10 Essential (primary) hypertension: Secondary | ICD-10-CM | POA: Diagnosis not present

## 2021-12-28 DIAGNOSIS — G4709 Other insomnia: Secondary | ICD-10-CM | POA: Diagnosis not present

## 2021-12-28 DIAGNOSIS — I4891 Unspecified atrial fibrillation: Secondary | ICD-10-CM | POA: Diagnosis not present

## 2021-12-28 DIAGNOSIS — M1712 Unilateral primary osteoarthritis, left knee: Secondary | ICD-10-CM | POA: Diagnosis not present

## 2021-12-28 DIAGNOSIS — I2510 Atherosclerotic heart disease of native coronary artery without angina pectoris: Secondary | ICD-10-CM | POA: Diagnosis not present

## 2021-12-28 DIAGNOSIS — R2681 Unsteadiness on feet: Secondary | ICD-10-CM | POA: Diagnosis not present

## 2021-12-28 DIAGNOSIS — M1612 Unilateral primary osteoarthritis, left hip: Secondary | ICD-10-CM | POA: Diagnosis not present

## 2021-12-28 DIAGNOSIS — M62838 Other muscle spasm: Secondary | ICD-10-CM | POA: Diagnosis not present

## 2021-12-28 DIAGNOSIS — Z96642 Presence of left artificial hip joint: Secondary | ICD-10-CM | POA: Diagnosis not present

## 2021-12-28 DIAGNOSIS — E876 Hypokalemia: Secondary | ICD-10-CM | POA: Diagnosis not present

## 2021-12-28 DIAGNOSIS — I272 Pulmonary hypertension, unspecified: Secondary | ICD-10-CM | POA: Diagnosis not present

## 2021-12-28 DIAGNOSIS — I509 Heart failure, unspecified: Secondary | ICD-10-CM | POA: Diagnosis not present

## 2021-12-28 DIAGNOSIS — M6281 Muscle weakness (generalized): Secondary | ICD-10-CM | POA: Diagnosis not present

## 2021-12-28 DIAGNOSIS — R2242 Localized swelling, mass and lump, left lower limb: Secondary | ICD-10-CM | POA: Diagnosis not present

## 2021-12-28 DIAGNOSIS — Z79899 Other long term (current) drug therapy: Secondary | ICD-10-CM | POA: Diagnosis not present

## 2021-12-28 DIAGNOSIS — E785 Hyperlipidemia, unspecified: Secondary | ICD-10-CM | POA: Diagnosis not present

## 2021-12-28 DIAGNOSIS — K219 Gastro-esophageal reflux disease without esophagitis: Secondary | ICD-10-CM | POA: Diagnosis not present

## 2021-12-28 DIAGNOSIS — Z96659 Presence of unspecified artificial knee joint: Secondary | ICD-10-CM | POA: Diagnosis not present

## 2021-12-28 DIAGNOSIS — M79662 Pain in left lower leg: Secondary | ICD-10-CM | POA: Diagnosis not present

## 2021-12-28 DIAGNOSIS — J99 Respiratory disorders in diseases classified elsewhere: Secondary | ICD-10-CM | POA: Diagnosis not present

## 2021-12-29 DIAGNOSIS — M6281 Muscle weakness (generalized): Secondary | ICD-10-CM | POA: Diagnosis not present

## 2021-12-29 DIAGNOSIS — E785 Hyperlipidemia, unspecified: Secondary | ICD-10-CM | POA: Diagnosis not present

## 2021-12-29 DIAGNOSIS — Z96659 Presence of unspecified artificial knee joint: Secondary | ICD-10-CM | POA: Diagnosis not present

## 2021-12-29 DIAGNOSIS — I4891 Unspecified atrial fibrillation: Secondary | ICD-10-CM | POA: Diagnosis not present

## 2021-12-30 DIAGNOSIS — M6281 Muscle weakness (generalized): Secondary | ICD-10-CM | POA: Diagnosis not present

## 2021-12-30 DIAGNOSIS — Z471 Aftercare following joint replacement surgery: Secondary | ICD-10-CM | POA: Diagnosis not present

## 2021-12-30 DIAGNOSIS — I10 Essential (primary) hypertension: Secondary | ICD-10-CM | POA: Diagnosis not present

## 2021-12-30 DIAGNOSIS — I4891 Unspecified atrial fibrillation: Secondary | ICD-10-CM | POA: Diagnosis not present

## 2022-01-04 DIAGNOSIS — Z96642 Presence of left artificial hip joint: Secondary | ICD-10-CM | POA: Diagnosis not present

## 2022-01-04 DIAGNOSIS — I272 Pulmonary hypertension, unspecified: Secondary | ICD-10-CM | POA: Diagnosis not present

## 2022-01-04 DIAGNOSIS — I4891 Unspecified atrial fibrillation: Secondary | ICD-10-CM | POA: Diagnosis not present

## 2022-01-04 DIAGNOSIS — Z471 Aftercare following joint replacement surgery: Secondary | ICD-10-CM | POA: Diagnosis not present

## 2022-01-07 ENCOUNTER — Telehealth: Payer: Self-pay | Admitting: *Deleted

## 2022-01-07 DIAGNOSIS — I10 Essential (primary) hypertension: Secondary | ICD-10-CM | POA: Diagnosis not present

## 2022-01-07 DIAGNOSIS — M6281 Muscle weakness (generalized): Secondary | ICD-10-CM | POA: Diagnosis not present

## 2022-01-07 DIAGNOSIS — Z471 Aftercare following joint replacement surgery: Secondary | ICD-10-CM | POA: Diagnosis not present

## 2022-01-07 DIAGNOSIS — I4891 Unspecified atrial fibrillation: Secondary | ICD-10-CM | POA: Diagnosis not present

## 2022-01-07 NOTE — Telephone Encounter (Signed)
Please advise 

## 2022-01-07 NOTE — Telephone Encounter (Signed)
Verbal orders given  

## 2022-01-07 NOTE — Telephone Encounter (Signed)
Copied from [place] [id number]. Topic: General - Other >> Jan 07, 2022  8:43 AM Burman Freestone wrote: Reason for CRM: Santiago Glad from [facility] called requesting verbal orders for PT and OT. Patient is being released from [facility] 01/08/22  Her call back number is [phone]

## 2022-01-10 DIAGNOSIS — I2510 Atherosclerotic heart disease of native coronary artery without angina pectoris: Secondary | ICD-10-CM | POA: Diagnosis not present

## 2022-01-10 DIAGNOSIS — Z87891 Personal history of nicotine dependence: Secondary | ICD-10-CM | POA: Diagnosis not present

## 2022-01-10 DIAGNOSIS — Z96653 Presence of artificial knee joint, bilateral: Secondary | ICD-10-CM | POA: Diagnosis not present

## 2022-01-10 DIAGNOSIS — Z96642 Presence of left artificial hip joint: Secondary | ICD-10-CM | POA: Diagnosis not present

## 2022-01-10 DIAGNOSIS — Z471 Aftercare following joint replacement surgery: Secondary | ICD-10-CM | POA: Diagnosis not present

## 2022-01-10 DIAGNOSIS — G4709 Other insomnia: Secondary | ICD-10-CM | POA: Diagnosis not present

## 2022-01-10 DIAGNOSIS — M159 Polyosteoarthritis, unspecified: Secondary | ICD-10-CM | POA: Diagnosis not present

## 2022-01-10 DIAGNOSIS — E559 Vitamin D deficiency, unspecified: Secondary | ICD-10-CM | POA: Diagnosis not present

## 2022-01-10 DIAGNOSIS — K580 Irritable bowel syndrome with diarrhea: Secondary | ICD-10-CM | POA: Diagnosis not present

## 2022-01-10 DIAGNOSIS — M62838 Other muscle spasm: Secondary | ICD-10-CM | POA: Diagnosis not present

## 2022-01-10 DIAGNOSIS — I6529 Occlusion and stenosis of unspecified carotid artery: Secondary | ICD-10-CM | POA: Diagnosis not present

## 2022-01-10 DIAGNOSIS — R1312 Dysphagia, oropharyngeal phase: Secondary | ICD-10-CM | POA: Diagnosis not present

## 2022-01-10 DIAGNOSIS — K219 Gastro-esophageal reflux disease without esophagitis: Secondary | ICD-10-CM | POA: Diagnosis not present

## 2022-01-10 DIAGNOSIS — I495 Sick sinus syndrome: Secondary | ICD-10-CM | POA: Diagnosis not present

## 2022-01-10 DIAGNOSIS — I10 Essential (primary) hypertension: Secondary | ICD-10-CM | POA: Diagnosis not present

## 2022-01-10 DIAGNOSIS — I272 Pulmonary hypertension, unspecified: Secondary | ICD-10-CM | POA: Diagnosis not present

## 2022-01-10 DIAGNOSIS — J449 Chronic obstructive pulmonary disease, unspecified: Secondary | ICD-10-CM | POA: Diagnosis not present

## 2022-01-10 DIAGNOSIS — K760 Fatty (change of) liver, not elsewhere classified: Secondary | ICD-10-CM | POA: Diagnosis not present

## 2022-01-10 DIAGNOSIS — I4891 Unspecified atrial fibrillation: Secondary | ICD-10-CM | POA: Diagnosis not present

## 2022-01-10 DIAGNOSIS — E7800 Pure hypercholesterolemia, unspecified: Secondary | ICD-10-CM | POA: Diagnosis not present

## 2022-01-10 DIAGNOSIS — G4733 Obstructive sleep apnea (adult) (pediatric): Secondary | ICD-10-CM | POA: Diagnosis not present

## 2022-01-10 DIAGNOSIS — E538 Deficiency of other specified B group vitamins: Secondary | ICD-10-CM | POA: Diagnosis not present

## 2022-01-10 DIAGNOSIS — M6281 Muscle weakness (generalized): Secondary | ICD-10-CM | POA: Diagnosis not present

## 2022-01-10 DIAGNOSIS — I081 Rheumatic disorders of both mitral and tricuspid valves: Secondary | ICD-10-CM | POA: Diagnosis not present

## 2022-01-10 DIAGNOSIS — Z950 Presence of cardiac pacemaker: Secondary | ICD-10-CM | POA: Diagnosis not present

## 2022-01-12 ENCOUNTER — Ambulatory Visit: Payer: Self-pay | Admitting: *Deleted

## 2022-01-12 ENCOUNTER — Other Ambulatory Visit: Payer: Self-pay | Admitting: Cardiovascular Disease

## 2022-01-12 NOTE — Telephone Encounter (Signed)
  Chief Complaint: COVID exposure Symptoms: none at this time-daughter tested + COVID yesterday- has been in home helping patient recover from hip surgery Frequency: exposure in home Pertinent Negatives: Patient denies any symptoms at this time Disposition: '[]'$ ED /'[]'$ Urgent Care (no appt availability in office) / '[]'$ Appointment(In office/virtual)/ '[]'$  [place] [facility]/ '[x]'$ Home Care/ '[]'$ Refused Recommended Disposition /'[]'$ [place] [facility]/ '[]'$  Follow-up with PCP Additional Notes: COVID exposure protocol reviewed- patient is aware to test 5-7 days or if she has symptoms- will call office for follow up if needed.

## 2022-01-12 NOTE — Telephone Encounter (Signed)
Advised 

## 2022-01-12 NOTE — Telephone Encounter (Signed)
Summary: Contact w/COVID positive daughter   Pt stated she just got home from having hip surgery stated her daughter tested positive for COVID last night.  Pt stated she is currently not having any symptoms yet. Pt is requesting an Rx to prevent symptoms. Pt has not taken COVID test yet.   Pt seeking clinical advice.      Reason for Disposition  [1] COVID-19 EXPOSURE within last 14 days AND [2] weak immune system (e.g., HIV positive, cancer chemo, splenectomy, organ transplant, chronic steroids) AND [3] NO symptoms  Answer Assessment - Initial Assessment Questions 1. COVID-19 EXPOSURE: "Please describe how you were exposed to someone with a COVID-19 infection."     Daughter-tested + COVID last night- in the home 2. PLACE of CONTACT: "Where were you when you were exposed to COVID-19?" (e.g., home, school, medical waiting room; which city?)     home 3. TYPE of CONTACT: "How much contact was there?" (e.g., sitting next to, live in same house, work in same office, same building)     home 4. DURATION of CONTACT: "How long were you in contact with the COVID-19 patient?" (e.g., a few seconds, passed by person, a few minutes, 15 minutes or longer, live with the patient)     home 5. MASK: "Were you wearing a mask?" "Was the other person wearing a mask?" Note: wearing a mask reduces the risk of an otherwise close contact.     No mask 6. DATE of CONTACT: "When did you have contact with a COVID-19 patient?" (e.g., how many days ago)     Test + COVID yesterday 7. COMMUNITY SPREAD: "Do you live in or have you traveled to an area where there are lots of COVID-19 cases (community spread)?" (See public health department website, if unsure)       yes 8. SYMPTOMS: "Do you have any symptoms?" (e.g., fever, cough, breathing difficulty, loss of taste or smell)     no 9. VACCINE: "Have you gotten the COVID-19 vaccine?" If Yes, ask: "Which one, how many shots, when did you get it?"     Yes-2/booster- over 1  year 10. PREGNANCY OR POSTPARTUM: "Is there any chance you are pregnant?" "When was your last menstrual period?" "Did you deliver in the last 2 weeks?"         11. HIGH RISK: "Do you have any heart or lung problems?" (e.g., asthma, COPD, heart failure) "Do you have a weak immune system or other risk factors?" (e.g., HIV positive, chemotherapy, renal failure, diabetes mellitus, sickle cell anemia, obesity)       s/p hip surgery, COPD, Heart history  Protocols used: Coronavirus (COVID-19) Exposure-A-AH

## 2022-01-12 NOTE — Telephone Encounter (Signed)
Prescription refill request for Eliquis received. Indication: AF Last office visit: 12/17/21  Johnny Bridge MD Scr: 0.8 on 12/27/21 Age: 81 Weight: 102.6kg  Based on above findings Eliquis '5mg'$  twice daily is the appropriate dose.  Refill approved.

## 2022-01-13 ENCOUNTER — Ambulatory Visit: Payer: Medicare HMO | Admitting: Cardiology

## 2022-01-19 ENCOUNTER — Ambulatory Visit: Payer: Self-pay

## 2022-01-19 ENCOUNTER — Telehealth: Payer: Medicare HMO | Admitting: Physician Assistant

## 2022-01-19 DIAGNOSIS — M10472 Other secondary gout, left ankle and foot: Secondary | ICD-10-CM | POA: Diagnosis not present

## 2022-01-19 MED ORDER — PREDNISONE 10 MG (21) PO TBPK: ORAL_TABLET | ORAL | 0 refills | Status: DC

## 2022-01-19 NOTE — Patient Instructions (Signed)
[name], thank you for joining Mar Daring, PA-C for today's virtual visit.  While this provider is not your primary care provider (PCP), if your PCP is located in our provider database this encounter information will be shared with them immediately following your visit.  Consent: (Patient) Lauren Lloyd provided verbal consent for this virtual visit at the beginning of the encounter.  Current Medications:  Current Outpatient Medications:    predniSONE (STERAPRED UNI-PAK 21 TAB) 10 MG (21) TBPK tablet, 6 day taper; take as directed on package instructions, Disp: 21 tablet, Rfl: 0   acetaminophen (TYLENOL) 325 MG tablet, Take 650 mg by mouth every 6 (six) hours as needed for moderate pain., Disp: , Rfl:    bisoprolol (ZEBETA) 5 MG tablet, Take '5mg'$  in the morning; Take 7.'5mg'$  at bedtime, Disp: 225 tablet, Rfl: 3   calcium carbonate (TUMS EX) 750 MG chewable tablet, Chew 2 tablets by mouth daily as needed for heartburn., Disp: , Rfl:    Cholecalciferol 25 MCG (1000 UT) tablet, Take 1,000 Units by mouth daily., Disp: , Rfl:    ELIQUIS 5 MG TABS tablet, TAKE ONE TABLET TWICE DAILY, Disp: 180 tablet, Rfl: 1   ezetimibe (ZETIA) 10 MG tablet, Take 1 tablet (10 mg total) by mouth daily., Disp: 90 tablet, Rfl: 3   furosemide (LASIX) 40 MG tablet, Take 1 tablet (40 mg total) by mouth daily. Take extra 40 mg as needed after lunch for abdominal swelling, leg swelling, or shortness of breath, Disp: 180 tablet, Rfl: 0   Menthol, Topical Analgesic, (BIOFREEZE EX), Apply 1 application. topically daily as needed (Neck pain)., Disp: , Rfl:    metaxalone (SKELAXIN) 800 MG tablet, Take 800 mg by mouth daily as needed for muscle spasms., Disp: , Rfl:    Multiple Vitamin (MULTIVITAMIN) capsule, Take 1 capsule by mouth daily., Disp: , Rfl:    mupirocin ointment (BACTROBAN) 2 %, Place 1 application  into the nose 2 (two) times daily., Disp: 22 g, Rfl: 0   potassium chloride (KLOR-CON) 10 MEQ tablet, TAKE 1  TABLET BY MOUTH DAILY, Disp: 30 tablet, Rfl: 3   Tiotropium Bromide Monohydrate (SPIRIVA RESPIMAT) 2.5 MCG/ACT AERS, Inhale 2 puffs into the lungs daily., Disp: 4 g, Rfl: 1   Medications ordered in this encounter:  Meds ordered this encounter  Medications   predniSONE (STERAPRED UNI-PAK 21 TAB) 10 MG (21) TBPK tablet    Sig: 6 day taper; take as directed on package instructions    Dispense:  21 tablet    Refill:  0    Order Specific Question:   Supervising Provider    Answer:   Sabra Heck, [place]     *If you need refills on other medications prior to your next appointment, please contact your pharmacy*  Follow-Up: Call back or seek an in-person evaluation if the symptoms worsen or if the condition fails to improve as anticipated.  Other Instructions Gout  Gout is painful swelling of your joints. Gout is a type of arthritis. It is caused by having too much uric acid in your body. Uric acid is a chemical that is made when your body breaks down substances called purines. If your body has too much uric acid, sharp crystals can form and build up in your joints. This causes pain and swelling. Gout attacks can happen quickly and be very painful (acute gout). Over time, the attacks can affect more joints and happen more often (chronic gout). What are the causes? Gout is  caused by too much uric acid in your blood. This can happen because: Your kidneys do not remove enough uric acid from your blood. Your body makes too much uric acid. You eat too many foods that are high in purines. These foods include organ meats, some seafood, and beer. Trauma or stress can bring on an attack. What increases the risk? Having a family history of gout. Being female and middle-aged. Being female and having gone through menopause. Having an organ transplant. Taking certain medicines. Having certain conditions, such as: Being very overweight (obese). Lead poisoning. Kidney disease. A skin condition called  psoriasis. Other risks include: Losing weight too quickly. Not having enough water in the body (being dehydrated). Drinking alcohol, especially beer. Drinking beverages that are sweetened with a type of sugar called fructose. What are the signs or symptoms? An attack of acute gout often starts at night and usually happens in just one joint. The most common place is the big toe. Other joints that may be affected include joints of the feet, ankle, knee, fingers, wrist, or elbow. Symptoms may include: Very bad pain. Warmth. Swelling. Stiffness. Tenderness. The affected joint may be very painful to touch. Shiny, red, or purple skin. Chills and fever. Chronic gout may cause symptoms more often. More joints may be involved. You may also have white or yellow lumps (tophi) on your hands or feet or in other areas near your joints. How is this treated? Treatment for an acute attack may include medicines for pain and swelling, such as: NSAIDs, such as ibuprofen. Steroids taken by mouth or injected into a joint. Colchicine. This can be given by mouth or through an IV tube. Treatment to prevent future attacks may include: Taking small doses of NSAIDs or colchicine daily. Using a medicine that reduces uric acid levels in your blood, such as allopurinol. Making changes to your diet. You may need to see a food expert (dietitian) about what to eat and drink to prevent gout. Follow these instructions at home: During a gout attack  If told, put ice on the painful area. To do this: Put ice in a plastic bag. Place a towel between your skin and the bag. Leave the ice on for 20 minutes, 2-3 times a day. Take off the ice if your skin turns bright red. This is very important. If you cannot feel pain, heat, or cold, you have a greater risk of damage to the area. Raise the painful joint above the level of your heart as often as you can. Rest the joint as much as possible. If the joint is in your leg, you may  be given crutches. Follow instructions from your doctor about what you cannot eat or drink. Avoiding future gout attacks Eat a low-purine diet. Avoid foods and drinks such as: Liver. Kidney. Anchovies. Asparagus. Herring. Mushrooms. Mussels. Beer. Stay at a healthy weight. If you want to lose weight, talk with your doctor. Do not lose weight too fast. Start or continue an exercise plan as told by your doctor. Eating and drinking Avoid drinks sweetened by fructose. Drink enough fluids to keep your pee (urine) pale yellow. If you drink alcohol: Limit how much you have to: 0-1 drink a day for women who are not pregnant. 0-2 drinks a day for men. Know how much alcohol is in a drink. In the [place] one drink equals one 12 oz bottle of beer (355 mL), one 5 oz glass of wine (148 mL), or one 1 oz glass of hard  liquor (44 mL). General instructions Take over-the-counter and prescription medicines only as told by your doctor. Ask your doctor if you should avoid driving or using machines while you are taking your medicine. Return to your normal activities when your doctor says that it is safe. Keep all follow-up visits. Where to find more information [employer] of [employer]: www.niams.[employer].es Contact a doctor if: You have another gout attack. You still have symptoms of a gout attack after 10 days of treatment. You have problems (side effects) because of your medicines. You have chills or a fever. You have burning pain when you pee (urinate). You have pain in your lower back or belly. Get help right away if: You have very bad pain. Your pain cannot be controlled. You cannot pee. Summary Gout is painful swelling of the joints. The most common site of pain is the big toe, but it can affect other joints. Medicines and avoiding some foods can help to prevent and treat gout attacks. This information is not intended to replace advice given to you by your health care provider. Make sure  you discuss any questions you have with your health care provider. Document Revised: 02/03/2021 Document Reviewed: 02/03/2021 Elsevier Patient Education  [place].    If you have been instructed to have an in-person evaluation today at a local Urgent Care facility, please use the link below. It will take you to a list of all of our available [facility], including address, phone number and hours of operation. Please do not delay care.  [facility]  If you or a family member do not have a primary care provider, use the link below to schedule a visit and establish care. When you choose a [facility] primary care physician or advanced practice provider, you gain a long-term partner in health. Find a Primary Care Provider  Learn more about [facility] in-office and virtual care options: [facility]

## 2022-01-19 NOTE — Telephone Encounter (Signed)
Noted.  Agree with the evaluation and prednisone course.  Patient should follow-up in office if symptoms do not improve.  Eulis Foster, MD [facility]

## 2022-01-19 NOTE — Progress Notes (Signed)
Virtual Visit Consent   Lauren Lloyd, you are scheduled for a virtual visit with a [facility] provider today. Just as with appointments in the office, your consent must be obtained to participate. Your consent will be active for this visit and any virtual visit you may have with one of our providers in the next 365 days. If you have a MyChart account, a copy of this consent can be sent to you electronically.  As this is a virtual visit, video technology does not allow for your provider to perform a traditional examination. This may limit your provider's ability to fully assess your condition. If your provider identifies any concerns that need to be evaluated in person or the need to arrange testing (such as labs, EKG, etc.), we will make arrangements to do so. Although advances in technology are sophisticated, we cannot ensure that it will always work on either your end or our end. If the connection with a video visit is poor, the visit may have to be switched to a telephone visit. With either a video or telephone visit, we are not always able to ensure that we have a secure connection.  By engaging in this virtual visit, you consent to the provision of healthcare and authorize for your insurance to be billed (if applicable) for the services provided during this visit. Depending on your insurance coverage, you may receive a charge related to this service.  I need to obtain your verbal consent now. Are you willing to proceed with your visit today? Lauren Lloyd has provided verbal consent on 01/19/2022 for a virtual visit (video or telephone). Mar Daring, PA-C  Date: 01/19/2022 10:33 AM  Virtual Visit via Video Note   I, Mar Daring, connected with  Lauren Lloyd  ([id number], 1940-12-03) on 01/19/22 at 10:00 AM EDT by a video-enabled telemedicine application and verified that I am speaking with the correct person using two identifiers.  Location: Patient: Virtual Visit Location  Patient: Home Provider: Virtual Visit Location Provider: Home Office   I discussed the limitations of evaluation and management by telemedicine and the availability of in person appointments. The patient expressed understanding and agreed to proceed.    Interactive audio and video communications were attempted, although failed due to patient's inability to connect to video. Continued visit with audio only interaction with patient agreement.   History of Present Illness: Lauren Lloyd is a 81 y.o. who identifies as a female who was assigned female at birth, and is being seen today for possible gout. She reports she had left hip replacement on 12/22/21. She has been doing well and doing PT at home. Last week and earlier this week she noticed she was having some discomfort in her left knee. She mentioned to her PT and they told her it was okay and expected due to compensation from surgery. Then this morning she awoke to her left great toe being extremely painful and swollen. She does not think it is red and is unable to reach down to feel if it is warm. She has no history of gout.    Problems:  Patient Active Problem List   Diagnosis Date Noted   Secondary hypercoagulable state ([place]) 10/18/2021   Chronic hip pain (Left) 08/19/2021   Osteoarthritis of hip (Left) 08/02/2021   DDD (degenerative disc disease), lumbar 07/27/2021   Lumbar foraminal stenosis (Multilevel) (Bilateral) (R>L: T12-L1, L1-2, L5-S1) (L>R: L2-3, L3-4,) 07/19/2021   Abnormal MRI, lumbar spine (02/24/2021) 06/16/2021  Chronic low back pain (1ry area of Pain) (Bilateral) (R>L) w/o sciatica 06/16/2021   Chronic sacroiliac joint pain (3ry area of Pain) (Right) 06/16/2021   Lumbar facet syndrome (Bilateral) 06/16/2021   Grade 1 Anterolisthesis of lumbar spine of L4/L5 06/16/2021   Retrolisthesis of L1 over L2 and L2 over L3 06/16/2021   Lumbar central spinal stenosis, w/o neurogenic claudication (L3-4, L4-5) 06/16/2021    Lumbosacral foraminal stenosis (Multilevel) (Bilateral) 06/16/2021   Lumbar lateral recess stenosis (Bilateral: L4-5) 06/16/2021   Lumbar facet arthropathy (Multilevel) (Bilateral) 06/16/2021   Chronic pain syndrome 06/15/2021   Pharmacologic therapy 06/15/2021   Disorder of skeletal system 06/15/2021   Problems influencing health status 06/15/2021   Chronic anticoagulation ([place]) 12/05/2020   Carotid artery disease ([place]) 12/05/2020   Preoperative evaluation to rule out surgical contraindication 11/10/2020   Avitaminosis D 08/03/2020   Chronic fatigue 08/03/2020   Insomnia 08/03/2020   Obesity (BMI 30-39.9) 11/08/2019   Bradycardia 11/08/2019   Thoracic spondylosis 02/27/2019   Dyspnea on exertion 08/28/2018   Degenerative cervical spinal stenosis 07/26/2018   Persistent atrial fibrillation ([place]) 06/06/2018   Paresthesias 05/30/2018   Chronic diastolic CHF (congestive heart failure) ([place]) 05/11/2018   Centrilobular emphysema ([place]) 05/11/2018   Hematuria 04/04/2017   Leg cramps 04/04/2017   Metatarsalgia of both feet 04/04/2017   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/2017   Essential hypertension 01/03/2017   Hemorrhoids 01/03/2017   Chronic right shoulder pain 01/03/2017   Pulmonary hypertension, unspecified ([place]) 04/02/2015   Acid reflux 11/18/2014   IBS (irritable bowel syndrome) 11/18/2014   Primary osteoarthritis of one hip 10/03/2011   L-S radiculopathy 09/29/2011   S/P TKR (total knee replacement), left 09/29/2011   Primary osteoarthritis of left knee 09/29/2011   Non-toxic uninodular goiter 06/20/2009   Cervical pain 08/20/2008   OSA (obstructive sleep apnea) 12/12/2007   Hypercholesteremia 07/30/2007    Allergies:  Allergies  Allergen Reactions   Levofloxacin Other (See Comments)    Other reaction(s): Joint Pains  Other Reaction: OTHER REACTION  Other reaction(s): Joint Pains   Influenza Vac Recombinant Ha Trivalent [Influenza Vaccine Recombinant]     Influenza Vaccines Other (See Comments)    Bell's Palsy   Other Other (See Comments)   Oysters [Shellfish Allergy] Swelling    She states she had eaten them three days in a row and she developed swelling around her eyes.    Medications:  Current Outpatient Medications:    predniSONE (STERAPRED UNI-PAK 21 TAB) 10 MG (21) TBPK tablet, 6 day taper; take as directed on package instructions, Disp: 21 tablet, Rfl: 0   acetaminophen (TYLENOL) 325 MG tablet, Take 650 mg by mouth every 6 (six) hours as needed for moderate pain., Disp: , Rfl:    bisoprolol (ZEBETA) 5 MG tablet, Take '5mg'$  in the morning; Take 7.'5mg'$  at bedtime, Disp: 225 tablet, Rfl: 3   calcium carbonate (TUMS EX) 750 MG chewable tablet, Chew 2 tablets by mouth daily as needed for heartburn., Disp: , Rfl:    Cholecalciferol 25 MCG (1000 UT) tablet, Take 1,000 Units by mouth daily., Disp: , Rfl:    ELIQUIS 5 MG TABS tablet, TAKE ONE TABLET TWICE DAILY, Disp: 180 tablet, Rfl: 1   ezetimibe (ZETIA) 10 MG tablet, Take 1 tablet (10 mg total) by mouth daily., Disp: 90 tablet, Rfl: 3   furosemide (LASIX) 40 MG tablet, Take 1 tablet (40 mg total) by mouth daily. Take extra 40 mg as needed after lunch for abdominal swelling, leg swelling, or  shortness of breath, Disp: 180 tablet, Rfl: 0   Menthol, Topical Analgesic, (BIOFREEZE EX), Apply 1 application. topically daily as needed (Neck pain)., Disp: , Rfl:    metaxalone (SKELAXIN) 800 MG tablet, Take 800 mg by mouth daily as needed for muscle spasms., Disp: , Rfl:    Multiple Vitamin (MULTIVITAMIN) capsule, Take 1 capsule by mouth daily., Disp: , Rfl:    mupirocin ointment (BACTROBAN) 2 %, Place 1 application  into the nose 2 (two) times daily., Disp: 22 g, Rfl: 0   potassium chloride (KLOR-CON) 10 MEQ tablet, TAKE 1 TABLET BY MOUTH DAILY, Disp: 30 tablet, Rfl: 3   Tiotropium Bromide Monohydrate (SPIRIVA RESPIMAT) 2.5 MCG/ACT AERS, Inhale 2 puffs into the lungs daily., Disp: 4 g, Rfl:  1  Observations/Objective: Patient is well-developed, well-nourished in no acute distress.  Resting comfortably at home.  Head is normocephalic, atraumatic.  No labored breathing.  Speech is clear and coherent with logical content.  Patient is alert and oriented at baseline.    Assessment and Plan: 1. Acute gout due to other secondary cause involving toe of left foot - predniSONE (STERAPRED UNI-PAK 21 TAB) 10 MG (21) TBPK tablet; 6 day taper; take as directed on package instructions  Dispense: 21 tablet; Refill: 0  - Suspect gout flare from recent surgery - Prednisone prescribed - Ice as needed (no more 10 minutes at a time)  - Tylenol as needed for pain - Seek in person evaluation if symptoms worsen or fail to improve  Follow Up Instructions: I discussed the assessment and treatment plan with the patient. The patient was provided an opportunity to ask questions and all were answered. The patient agreed with the plan and demonstrated an understanding of the instructions.  A copy of instructions were sent to the patient via MyChart unless otherwise noted below.    The patient was advised to call back or seek an in-person evaluation if the symptoms worsen or if the condition fails to improve as anticipated.  Time:  I spent 15 minutes with the patient via telehealth technology discussing the above problems/concerns.    Mar Daring, PA-C

## 2022-01-19 NOTE — Telephone Encounter (Signed)
Patient had a virtual appointment today and was prescribed Prednisone for suspected gout flare from recent surgery.

## 2022-01-19 NOTE — Telephone Encounter (Signed)
  Chief Complaint: Very painful left great toe Symptoms: swollen Frequency: yesterday Pertinent Negatives: Patient denies Fever Disposition: '[]'$ ED /'[]'$ Urgent Care (no appt availability in office) / '[]'$ Appointment(In office/virtual)/ '[x]'$  [facility]/ '[]'$ Home Care/ '[]'$ Refused Recommended Disposition /'[]'$ [facility]/ '[]'$  Follow-up with PCP Additional Notes: Pt recently had hip sx. Since yesterday left great toe is swollen and very painful. Pt thinks it may be gout. PT is limited in OTC pain medications d/t afib.  Reason for Disposition  [1] SEVERE pain (e.g., excruciating, unable to do any normal activities) AND [2] not improved after 2 hours of pain medicine  Answer Assessment - Initial Assessment Questions 1. ONSET: "When did the pain start?"      yesterday 2. LOCATION: "Where is the pain located?"      Left great toe 3. PAIN: "How bad is the pain?"    (Scale 1-10; or mild, moderate, severe)  - MILD (1-3): doesn't interfere with normal activities.   - MODERATE (4-7): interferes with normal activities (e.g., work or school) or awakens from sleep, limping.   - SEVERE (8-10): excruciating pain, unable to do any normal activities, unable to walk.      8/10 4. WORK OR EXERCISE: "Has there been any recent work or exercise that involved this part of the body?"      no 5. CAUSE: "What do you think is causing the foot pain?"     gout 6. OTHER SYMPTOMS: "Do you have any other symptoms?" (e.g., leg pain, rash, fever, numbness)     swollen 7. PREGNANCY: "Is there any chance you are pregnant?" "When was your last menstrual period?"     na  Protocols used: Foot Pain-A-AH

## 2022-01-24 DIAGNOSIS — I495 Sick sinus syndrome: Secondary | ICD-10-CM | POA: Diagnosis not present

## 2022-01-24 DIAGNOSIS — I4891 Unspecified atrial fibrillation: Secondary | ICD-10-CM | POA: Diagnosis not present

## 2022-01-24 DIAGNOSIS — I10 Essential (primary) hypertension: Secondary | ICD-10-CM | POA: Diagnosis not present

## 2022-01-24 DIAGNOSIS — Z471 Aftercare following joint replacement surgery: Secondary | ICD-10-CM | POA: Diagnosis not present

## 2022-01-24 DIAGNOSIS — J449 Chronic obstructive pulmonary disease, unspecified: Secondary | ICD-10-CM | POA: Diagnosis not present

## 2022-01-24 DIAGNOSIS — I272 Pulmonary hypertension, unspecified: Secondary | ICD-10-CM | POA: Diagnosis not present

## 2022-01-24 DIAGNOSIS — M62838 Other muscle spasm: Secondary | ICD-10-CM | POA: Diagnosis not present

## 2022-01-24 DIAGNOSIS — Z96642 Presence of left artificial hip joint: Secondary | ICD-10-CM | POA: Diagnosis not present

## 2022-01-24 DIAGNOSIS — G4733 Obstructive sleep apnea (adult) (pediatric): Secondary | ICD-10-CM | POA: Diagnosis not present

## 2022-01-24 DIAGNOSIS — M6281 Muscle weakness (generalized): Secondary | ICD-10-CM | POA: Diagnosis not present

## 2022-01-24 DIAGNOSIS — M159 Polyosteoarthritis, unspecified: Secondary | ICD-10-CM | POA: Diagnosis not present

## 2022-01-25 ENCOUNTER — Telehealth: Payer: Medicare HMO

## 2022-01-25 NOTE — Progress Notes (Unsigned)
Electrophysiology Office Follow up Visit Note:    Date:  01/26/2022   ID:  Lauren Lloyd, DOB 05-25-1940, MRN [id number]  PCP:  [name], MD  [facility] Cardiologist:  Ida Rogue, MD  [facility] Electrophysiologist:  Vickie Epley, MD    Interval History:    Lauren Lloyd is a 81 y.o. female who presents for a follow up visit after PPM implant 10/25/2021. She has persistent AF. Last remote interrogation shows stable device function.   She was admitted for dofetilide load but this had to be stopped due to VT. She was noted to have tachybrady and remained bradycardic after dofetilide wash out and required PPM implant.  She is also previously failed amiodarone.  She continues to feel poorly with shortness of breath.  She has very rapidly conducted atrial fibrillation.  By her histogram through her pacemaker her average ventricular rate is in the 130s.     Past Medical History:  Diagnosis Date   (HFpEF) heart failure with preserved ejection fraction ([place])    a. 05/2018 Echo: EF 55-60%, no rwma, mild to mod MR. Nl RV fxn. Mod TR. PASP 78mHg.   Arthritis    knees, Hands   Arthritis of knee    Back pain    Carotid arterial disease ([place]    a. 03/2019 Carotid U/S: <50% bilat ICA stenoses.   Cholelithiasis    a. 10/2018 noted on CT.   Edema, lower extremity    Fatty liver    GERD (gastroesophageal reflux disease)    History of stress test    a. 06/2018 MV: EF 59%, no ischemia/infarct. Low risk.   Knee pain    Lactose intolerance    Mitral regurgitation    a. 05/2018 Echo: mild to mod MR.   Multinodular goiter    Obesity    OSA (obstructive sleep apnea)    PAF (paroxysmal atrial fibrillation) ([place]    a.  Diagnosed 12/19; b. 05/2018 s/p DCCV; c. 03/2019 & 05/2019 recurrent AFib-->managed w/ amio load; d. CHADS2VASc = 6 (CHF, HTN, age x 2, vascular disease, female)-->Eliquis & amio 100 qd.   PAH (pulmonary artery hypertension) (HCC)    Scoliosis    SOB  (shortness of breath)    Swallowing difficulty     Past Surgical History:  Procedure Laterality Date   CARDIOVERSION N/A 06/15/2018   Procedure: CARDIOVERSION (CATH LAB);  Surgeon: GMinna Merritts MD;  Location: [facility] ORS;  Service: Cardiovascular;  Laterality: N/A;   CARDIOVERSION N/A 02/18/2021   Procedure: CARDIOVERSION;  Surgeon: GMinna Merritts MD;  Location: [facility] ORS;  Service: Cardiovascular;  Laterality: N/A;   CATARACT EXTRACTION W/PHACO Right 01/25/2016   Procedure: CATARACT EXTRACTION PHACO AND INTRAOCULAR LENS PLACEMENT ([place];  Surgeon: ARonnell Freshwater MD;  [facility]  Service: Ophthalmology;  Laterality: Right;  RIGHT   CATARACT EXTRACTION W/PHACO Left 02/22/2016   Procedure: CATARACT EXTRACTION PHACO AND INTRAOCULAR LENS PLACEMENT ([place];  Surgeon: ARonnell Freshwater MD;  [facility]  Service: Ophthalmology;  Laterality: Left;  LEFT   HAMMER TOE SURGERY  05/16/2008   KNEE ARTHROSCOPY Right 05/16/2002   PACEMAKER IMPLANT N/A 10/25/2021   Procedure: PACEMAKER IMPLANT;  Surgeon: LVickie Epley MD;  [facility];  Service: Cardiovascular;  Laterality: N/A;   [facility]  SKIN GRAFT Left 04/08/2013   Done on left index finger   TONSILLECTOMY  05/16/1944  TOTAL KNEE ARTHROPLASTY Left 12/02/2020    Current Medications: Current Meds  Medication Sig   acetaminophen (TYLENOL) 325 MG tablet Take 650 mg by mouth every 6 (six) hours as needed for moderate pain.   calcium carbonate (TUMS EX) 750 MG chewable tablet Chew 2 tablets by mouth daily as needed for heartburn.   Cholecalciferol 25 MCG (1000 UT) tablet Take 1,000 Units by mouth daily.   ELIQUIS 5 MG TABS tablet TAKE ONE TABLET TWICE DAILY   ezetimibe (ZETIA) 10 MG tablet Take 1 tablet (10 mg total) by mouth daily.   furosemide (LASIX) 40 MG tablet Take 1 tablet (40 mg total) by mouth  daily. Take extra 40 mg as needed after lunch for abdominal swelling, leg swelling, or shortness of breath   Menthol, Topical Analgesic, (BIOFREEZE EX) Apply 1 application. topically daily as needed (Neck pain).   metaxalone (SKELAXIN) 800 MG tablet Take 800 mg by mouth daily as needed for muscle spasms.   Multiple Vitamin (MULTIVITAMIN) capsule Take 1 capsule by mouth daily.   mupirocin ointment (BACTROBAN) 2 % Place 1 application  into the nose 2 (two) times daily.   potassium chloride (KLOR-CON) 10 MEQ tablet TAKE 1 TABLET BY MOUTH DAILY   predniSONE (STERAPRED UNI-PAK 21 TAB) 10 MG (21) TBPK tablet 6 day taper; take as directed on package instructions   Tiotropium Bromide Monohydrate (SPIRIVA RESPIMAT) 2.5 MCG/ACT AERS Inhale 2 puffs into the lungs daily.   [DISCONTINUED] bisoprolol (ZEBETA) 5 MG tablet Take '5mg'$  in the morning; Take 7.'5mg'$  at bedtime     Allergies:   Levofloxacin, Influenza vaccine recombinant, Influenza vaccines, Other, and Oysters [shellfish allergy]   Social History   Socioeconomic History   Marital status: Single    Spouse name: Not on file   Number of children: 5   Years of education: college   Highest education level: Bachelor's degree (e.g., BA, AB, BS)  Occupational History   Occupation: Retail banker: [employer]  Tobacco Use   Smoking status: Former    Packs/day: 1.00    Years: 30.00    Total pack years: 30.00    Types: Cigarettes    Quit date: 05/16/1989    Years since quitting: 32.7   Smokeless tobacco: Never  Vaping Use   Vaping Use: Never used  Substance and Sexual Activity   Alcohol use: Yes    Alcohol/week: 7.0 standard drinks of alcohol    Types: 7 Glasses of wine per week    Comment: 0-2 a night   Drug use: No   Sexual activity: Not Currently  Other Topics Concern   Not on file  Social History Narrative   Pt has a child who passed away at age 67   Social Determinants of Health   Financial Resource Strain:  Low Risk  (08/13/2021)   Overall Financial Resource Strain (CARDIA)    Difficulty of Paying Living Expenses: Not hard at all  Food Insecurity: No Food Insecurity (02/03/2020)   Hunger Vital Sign    Worried About Running Out of Food in the Last Year: Never true    Ran Out of Food in the Last Year: Never true  Transportation Needs: No Transportation Needs (04/03/2020)   PRAPARE - Hydrologist (Medical): No    Lack of Transportation (Non-Medical): No  Physical Activity: Insufficiently Active (02/03/2020)   Exercise Vital Sign    Days of Exercise per Week: 2 days    Minutes  of Exercise per Session: 40 min  Stress: No Stress Concern Present (02/03/2020)   [place]    Feeling of Stress : Not at all  Social Connections: Moderately Integrated (02/03/2020)   Social Connection and Isolation Panel [NHANES]    Frequency of Communication with Friends and Family: More than three times a week    Frequency of Social Gatherings with Friends and Family: More than three times a week    Attends Religious Services: More than 4 times per year    Active Member of [employer] or Organizations: Yes    Attends Music therapist: More than 4 times per year    Marital Status: Divorced     Family History: [name] family history includes Atrial fibrillation in her sister and sister; Breast cancer (age of onset: 3) in her sister; Healthy in her brother; Heart attack in her father; Heart disease in her mother; High blood pressure in her father; Hyperlipidemia in her sister and sister; Stroke in her father; Transient ischemic attack in her mother.  ROS:   Please see the history of present illness.    All other systems reviewed and are negative.  EKGs/Labs/Other Studies Reviewed:    The following studies were reviewed today:  01/25/2022 in clinic device interrogation personally reviewed Battery longevity 9  years Lead parameters stable Less than 1% atrial pacing, 12% ventricular pacing 100% A-fib Decreased lead outputs today to maximize battery longevity  Recent Labs: 08/16/2021: ALT CANCELED 10/22/2021: Magnesium 2.5 11/04/2021: BUN 28; Creatinine, Ser 1.13; Hemoglobin 14.5; Platelets 298; Potassium 3.8; Sodium 140  Recent Lipid Panel    Component Value Date/Time   CHOL CANCELED 08/16/2021 0850   TRIG CANCELED 08/16/2021 0850   HDL CANCELED 08/16/2021 0850   CHOLHDL 4.4 08/03/2020 1012   CHOLHDL 3.6 05/31/2018 0452   VLDL 21 05/31/2018 0452   LDLCALC 188 (H) 08/03/2020 1012    Physical Exam:    VS:  BP 130/80 (BP Location: Left Arm, Patient Position: Sitting, Cuff Size: Normal)   Pulse 79   Ht '5\' 7"'$  (1.702 m)   Wt 225 lb 6 oz (102.2 kg)   SpO2 95%   BMI 35.30 kg/m     Wt Readings from Last 3 Encounters:  01/26/22 225 lb 6 oz (102.2 kg)  12/17/21 226 lb 4 oz (102.6 kg)  11/11/21 219 lb 4.8 oz (99.5 kg)     GEN:  Well nourished, well developed in no acute distress HEENT: Normal NECK: No JVD; No carotid bruits LYMPHATICS: No lymphadenopathy CARDIAC: Tachycardic greater than 100 bpm, irregularly irregular, no murmurs, rubs, gallops. Prepectoral pocket well healed. RESPIRATORY:  Clear to auscultation without rales, wheezing or rhonchi  ABDOMEN: Soft, non-tender, non-distended MUSCULOSKELETAL:  No edema; No deformity  SKIN: Warm and dry NEUROLOGIC:  Alert and oriented x 3 PSYCHIATRIC:  Normal affect        ASSESSMENT:    1. Permanent atrial fibrillation ([place])   2. Chronic diastolic CHF (congestive heart failure) ([place])   3. Tachycardia-bradycardia syndrome ([place])   4. Cardiac pacemaker in situ    PLAN:    In order of problems listed above:  #Permanent AF Continue eliquis. Previously failed dofetilide due to VT. previously failed amiodarone. Continue bisoprolol.  Increase to 7.5 mg by mouth twice daily.  If she tolerates this for 1 week, increase to 10 mg by mouth  twice daily. I did discuss AV nodal ablation during today's appointment.  She would like  to think about this as an option and will let us know if she wants to proceed.  I did discuss the procedure in detail including the risks and recovery.  Risk, benefits, and alternatives to EP study and radiofrequency ablation of the AV node were also discussed in detail today. These risks include but are not limited to stroke, bleeding, vascular damage, tamponade, perforation, worsening renal function, and death. The patient understands these risks.  #Shortness of breath Likely multifactorial secondary to elevated pulmonary pressures, rapidly conducted atrial fibrillation and diastolic heart failure.  #Chronic diastolic HF Euvolemic. Continue current medical therapy including furosemide.  #Tachybrady #PPM in situ Device functioning appropriately. Continue remote monitoring.   Follow-up 3 months with an APP.      Medication Adjustments/Labs and Tests Ordered: Current medicines are reviewed at length with the patient today.  Concerns regarding medicines are outlined above.  No orders of the defined types were placed in this encounter.  No orders of the defined types were placed in this encounter.    Signed, Lars Mage, MD, [facility], [facility] 01/26/2022 11:11 AM    Electrophysiology [facility]

## 2022-01-26 ENCOUNTER — Ambulatory Visit: Payer: Medicare HMO | Attending: Cardiology | Admitting: Cardiology

## 2022-01-26 ENCOUNTER — Ambulatory Visit (INDEPENDENT_AMBULATORY_CARE_PROVIDER_SITE_OTHER): Payer: Medicare HMO

## 2022-01-26 ENCOUNTER — Encounter: Payer: Self-pay | Admitting: Cardiology

## 2022-01-26 VITALS — BP 130/80 | HR 79 | Ht 67.0 in | Wt 225.4 lb

## 2022-01-26 DIAGNOSIS — Z950 Presence of cardiac pacemaker: Secondary | ICD-10-CM

## 2022-01-26 DIAGNOSIS — I5032 Chronic diastolic (congestive) heart failure: Secondary | ICD-10-CM | POA: Diagnosis not present

## 2022-01-26 DIAGNOSIS — I495 Sick sinus syndrome: Secondary | ICD-10-CM

## 2022-01-26 DIAGNOSIS — I4821 Permanent atrial fibrillation: Secondary | ICD-10-CM

## 2022-01-26 DIAGNOSIS — I4819 Other persistent atrial fibrillation: Secondary | ICD-10-CM

## 2022-01-26 MED ORDER — BISOPROLOL FUMARATE 5 MG PO TABS: ORAL_TABLET | ORAL | 7 refills | Status: DC

## 2022-01-26 NOTE — Patient Instructions (Signed)
Medication Instructions:  Increase your Bisoprolol to 7.5 mg two times a day for 7 days, if you are tolerating then increase to 10 mg two times a day going forward.  *If you need a refill on your cardiac medications before your next appointment, please call your pharmacy*   Lab Work: none If you have labs (blood work) drawn today and your tests are completely normal, you will receive your results only by: [place] (if you have MyChart) OR A paper copy in the mail If you have any lab test that is abnormal or we need to change your treatment, we will call you to review the results.   Testing/Procedures: Your physician has recommended that you have an ablation. Catheter ablation is a medical procedure used to treat some cardiac arrhythmias (irregular heartbeats). During catheter ablation, a long, thin, flexible tube is put into a blood vessel in your groin (upper thigh), or neck. This tube is called an ablation catheter. It is then guided to your heart through the blood vessel. Radio frequency waves destroy small areas of heart tissue where abnormal heartbeats may cause an arrhythmia to start. Please see the instruction sheet given to you today.    Follow-Up: At [facility], you and your health needs are our priority.  As part of our continuing mission to provide you with exceptional heart care, we have created designated Provider Care Teams.  These Care Teams include your primary Cardiologist (physician) and Advanced Practice Providers (APPs -  Physician Assistants and Nurse Practitioners) who all work together to provide you with the care you need, when you need it.  We recommend signing up for the patient portal called "MyChart".  Sign up information is provided on this After Visit Summary.  MyChart is used to connect with patients for Virtual Visits (Telemedicine).  Patients are able to view lab/test results, encounter notes, upcoming appointments, etc.  Non-urgent messages can  be sent to your provider as well.   To learn more about what you can do with MyChart, go to [email or web address].    Your next appointment:   3 month(s)  The format for your next appointment:   In Person  Provider:   You will see one of the following Advanced Practice Providers on your designated Care Team:   Murray Hodgkins, NP Christell Faith, PA-C Cadence Kathlen Mody, PA-C Gerrie Nordmann, NP      Other Instructions Please call the office if you wish to move forward with an AV node ablation.   Important Information About Sugar

## 2022-01-27 LAB — CUP PACEART REMOTE DEVICE CHECK
Battery Remaining Longevity: 108 mo
Battery Remaining Percentage: 100 %
Brady Statistic RA Percent Paced: 0 %
Brady Statistic RV Percent Paced: 6 %
Date Time Interrogation Session: 20230914113800
Implantable Lead Implant Date: 20230612
Implantable Lead Implant Date: 20230612
Implantable Lead Location: 753859
Implantable Lead Location: 753860
Implantable Lead Model: 7841
Implantable Lead Model: 7842
Implantable Lead Serial Number: 1182863
Implantable Lead Serial Number: 1274488
Implantable Pulse Generator Implant Date: 20230612
Lead Channel Impedance Value: 525 Ohm
Lead Channel Impedance Value: 674 Ohm
Lead Channel Pacing Threshold Amplitude: 0.7 V
Lead Channel Pacing Threshold Pulse Width: 0.4 ms
Lead Channel Setting Pacing Amplitude: 2.5 V
Lead Channel Setting Pacing Amplitude: 3.5 V
Lead Channel Setting Pacing Pulse Width: 0.4 ms
Lead Channel Setting Sensing Sensitivity: 2.5 mV
Pulse Gen Serial Number: 111954

## 2022-02-10 NOTE — Progress Notes (Signed)
Remote pacemaker transmission.   

## 2022-02-18 ENCOUNTER — Encounter: Payer: Medicare HMO | Admitting: Family Medicine

## 2022-02-22 ENCOUNTER — Ambulatory Visit (INDEPENDENT_AMBULATORY_CARE_PROVIDER_SITE_OTHER): Payer: Medicare HMO | Admitting: Family Medicine

## 2022-02-22 ENCOUNTER — Encounter: Payer: Self-pay | Admitting: Family Medicine

## 2022-02-22 VITALS — BP 120/75 | HR 72 | Temp 98.0°F | Resp 16 | Ht 67.5 in | Wt 229.9 lb

## 2022-02-22 DIAGNOSIS — R0602 Shortness of breath: Secondary | ICD-10-CM

## 2022-02-22 DIAGNOSIS — I4819 Other persistent atrial fibrillation: Secondary | ICD-10-CM

## 2022-02-22 DIAGNOSIS — G4733 Obstructive sleep apnea (adult) (pediatric): Secondary | ICD-10-CM

## 2022-02-22 DIAGNOSIS — J432 Centrilobular emphysema: Secondary | ICD-10-CM

## 2022-02-22 MED ORDER — NALTREXONE HCL 50 MG PO TABS: 25.0000 mg | ORAL_TABLET | Freq: Every day | ORAL | 2 refills | Status: DC

## 2022-02-22 MED ORDER — BUPROPION HCL ER (XL) 150 MG PO TB24: 150.0000 mg | ORAL_TABLET | Freq: Every day | ORAL | 2 refills | Status: DC

## 2022-02-22 NOTE — Assessment & Plan Note (Signed)
Long history of smoking She is out of the age window for lung cancer screening She does have ongoing shortness of breath despite treatment by cardiology Her shortness of breath is likely multifactorial and COPD may be contributing as well We will repeat chest CT She is not currently on any inhalers and did not find Spiriva or Trelegy helpful in the past

## 2022-02-22 NOTE — Assessment & Plan Note (Signed)
Tolerating CPAP well at this time and improved symptoms Continue CPAP

## 2022-02-22 NOTE — Assessment & Plan Note (Signed)
Followed closely by cardiology Now has a pacemaker Did not tolerate Tikosyn No changes today Samples given of Eliquis We will plug her back in with our pharmacist to see if she can get patient assistance for her Eliquis as she is now in the donut hole

## 2022-02-22 NOTE — Progress Notes (Signed)
Will do! Alex Jerl Munyan, PharmD, BCACP, CPP  Clinical Pharmacist Practitioner  [facility] [phone] 

## 2022-02-22 NOTE — Progress Notes (Signed)
I,Sulibeya S Dimas,acting as a Education administrator for Lavon Paganini, MD.,have documented all relevant documentation on the behalf of Lavon Paganini, MD,as directed by  Lavon Paganini, MD while in the presence of Lavon Paganini, MD.     Established patient visit   Patient: Lauren Lloyd   DOB: 02-Aug-1940   81 y.o. Female  MRN: [id number] Visit Date: 02/22/2022  Today's healthcare provider: Lavon Paganini, MD   Chief Complaint  Patient presents with   COPD   Atrial Fibrillation   Subjective    HPI   Patient would like to go over her chronic problems.  She is complaining of worsening shortness of breath. Patient complaining of back pain which she is relating to weight gain. She would like to start a medication for weight loss. She reports she is not able to exercise due to chronic problems.  F/b Dr Quentin Ore - failed Tikosyn. Has pacemaker in place.  Taking bisoprolol.  Feels like she needs to rest after walking in from the parking lot.  Was hoping hip replacement was going to help her back pain. It has not. She has been doing PT.  Medications: Outpatient Medications Prior to Visit  Medication Sig   acetaminophen (TYLENOL) 325 MG tablet Take 650 mg by mouth every 6 (six) hours as needed for moderate pain.   bisoprolol (ZEBETA) 5 MG tablet Take 1.5 tablets (7.5 mg total) by mouth 2 (two) times daily for 7 days, THEN 2 tablets (10 mg total) 2 (two) times daily. Increase your Bisoprolol to 7.5 mg two times a day for 7 days, if you are tolerating then increase to 10 mg two times a day going forward. IF you move to 10 mg two times a day okay to call the office to switch to the 10 mg tablets..   calcium carbonate (TUMS EX) 750 MG chewable tablet Chew 2 tablets by mouth daily as needed for heartburn.   Cholecalciferol 25 MCG (1000 UT) tablet Take 1,000 Units by mouth daily.   ELIQUIS 5 MG TABS tablet TAKE ONE TABLET TWICE DAILY   ezetimibe (ZETIA) 10 MG tablet Take 1 tablet (10 mg  total) by mouth daily.   furosemide (LASIX) 40 MG tablet Take 1 tablet (40 mg total) by mouth daily. Take extra 40 mg as needed after lunch for abdominal swelling, leg swelling, or shortness of breath   Menthol, Topical Analgesic, (BIOFREEZE EX) Apply 1 application. topically daily as needed (Neck pain).   metaxalone (SKELAXIN) 800 MG tablet Take 800 mg by mouth daily as needed for muscle spasms.   Multiple Vitamin (MULTIVITAMIN) capsule Take 1 capsule by mouth daily.   mupirocin ointment (BACTROBAN) 2 % Place 1 application  into the nose 2 (two) times daily.   potassium chloride (KLOR-CON) 10 MEQ tablet TAKE 1 TABLET BY MOUTH DAILY   [DISCONTINUED] predniSONE (STERAPRED UNI-PAK 21 TAB) 10 MG (21) TBPK tablet 6 day taper; take as directed on package instructions   [DISCONTINUED] Tiotropium Bromide Monohydrate (SPIRIVA RESPIMAT) 2.5 MCG/ACT AERS Inhale 2 puffs into the lungs daily.   No facility-administered medications prior to visit.    Review of Systems per HPI     Objective    BP 120/75 (BP Location: Left Arm, Patient Position: Sitting, Cuff Size: Large)   Pulse 72   Temp 98 F (36.7 C) (Oral)   Resp 16   Ht 5' 7.5" (1.715 m)   Wt 229 lb 14.4 oz (104.3 kg)   SpO2 (!) 84%   BMI 35.48  kg/m    Physical Exam Vitals reviewed.  Constitutional:      General: She is not in acute distress.    Appearance: Normal appearance. She is well-developed. She is not diaphoretic.  HENT:     Head: Normocephalic and atraumatic.  Eyes:     General: No scleral icterus.    Conjunctiva/sclera: Conjunctivae normal.  Neck:     Thyroid: No thyromegaly.  Cardiovascular:     Rate and Rhythm: Normal rate and regular rhythm.  Pulmonary:     Effort: Pulmonary effort is normal. No respiratory distress.     Breath sounds: Normal breath sounds. No wheezing, rhonchi or rales.  Musculoskeletal:     Cervical back: Neck supple.     Right lower leg: Edema present.     Left lower leg: Edema present.   Lymphadenopathy:     Cervical: No cervical adenopathy.  Skin:    General: Skin is warm and dry.     Findings: No rash.  Neurological:     Mental Status: She is alert and oriented to person, place, and time. Mental status is at baseline.  Psychiatric:        Mood and Affect: Mood normal.        Behavior: Behavior normal.       No results found for any visits on 02/22/22.  Assessment & Plan     Problem List Items Addressed This Visit       Cardiovascular and Mediastinum   Persistent atrial fibrillation (HCC) - Primary    Followed closely by cardiology Now has a pacemaker Did not tolerate Tikosyn No changes today Samples given of Eliquis We will plug her back in with our pharmacist to see if she can get patient assistance for her Eliquis as she is now in the donut hole        Respiratory   OSA (obstructive sleep apnea)    Tolerating CPAP well at this time and improved symptoms Continue CPAP      Centrilobular emphysema ([place])    Long history of smoking She is out of the age window for lung cancer screening She does have ongoing shortness of breath despite treatment by cardiology Her shortness of breath is likely multifactorial and COPD may be contributing as well We will repeat chest CT She is not currently on any inhalers and did not find Spiriva or Trelegy helpful in the past      Relevant Orders   CT Chest Wo Contrast   Other Visit Diagnoses     Chronic shortness of breath       Relevant Orders   CT Chest Wo Contrast        Return in about 3 months (around 05/25/2022) for chronic disease f/u, weight f/u.      I, Lavon Paganini, MD, have reviewed all documentation for this visit. The documentation on 02/22/22 for the exam, diagnosis, procedures, and orders are all accurate and complete.   Lakenzie Mcclafferty, Dionne Bucy, MD, MPH [facility]

## 2022-03-01 ENCOUNTER — Ambulatory Visit
Admission: RE | Admit: 2022-03-01 | Discharge: 2022-03-01 | Disposition: A | Discharge status: Home / Self Care | Payer: Medicare HMO | Source: Ambulatory Visit | Attending: Family Medicine | Admitting: Family Medicine

## 2022-03-01 DIAGNOSIS — R0602 Shortness of breath: Secondary | ICD-10-CM | POA: Diagnosis not present

## 2022-03-01 DIAGNOSIS — J432 Centrilobular emphysema: Secondary | ICD-10-CM | POA: Insufficient documentation

## 2022-03-04 ENCOUNTER — Other Ambulatory Visit: Payer: Self-pay | Admitting: Family Medicine

## 2022-03-04 DIAGNOSIS — Z1231 Encounter for screening mammogram for malignant neoplasm of breast: Secondary | ICD-10-CM

## 2022-03-07 ENCOUNTER — Telehealth: Payer: Self-pay

## 2022-03-07 NOTE — Telephone Encounter (Signed)
Verbal orders given to [facility] with [facility]

## 2022-03-07 NOTE — Telephone Encounter (Signed)
OK for verbals 

## 2022-03-07 NOTE — Telephone Encounter (Signed)
Copied from [place] [id number]. Topic: Quick Communication - Home Health Verbal Orders >> Mar 07, 2022 10:05 AM Marcellus Scott wrote: Caller/Agency: [facility] Number: [phone] Requesting OT/PT/Skilled Nursing/Social Work/Speech Therapy: PT Frequency: 1w5,1 every 2w4

## 2022-03-15 NOTE — Progress Notes (Unsigned)
I,Tracyann Duffell S Harlan Vinal,acting as a Education administrator for Lavon Paganini, MD.,have documented all relevant documentation on the behalf of Lavon Paganini, MD,as directed by  Lavon Paganini, MD while in the presence of Lavon Paganini, MD.    Annual Wellness Visit     Patient: Lauren Lloyd, Female    DOB: Aug 24, 1940, 81 y.o.   MRN: [id number] Visit Date: 03/17/2022  Today's Provider: Lavon Paganini, MD   No chief complaint on file.  Subjective    Lauren Lloyd is a 81 y.o. female who presents today for her Annual Wellness Visit. She reports consuming a {diet types:17450} diet. {Exercise:19826} She generally feels {well/fairly well/poorly:18703}. She reports sleeping {well/fairly well/poorly:18703}. She {does/does not:200015} have additional problems to discuss today.   HPI  Medications: Outpatient Medications Prior to Visit  Medication Sig   acetaminophen (TYLENOL) 325 MG tablet Take 650 mg by mouth every 6 (six) hours as needed for moderate pain.   bisoprolol (ZEBETA) 5 MG tablet Take 1.5 tablets (7.5 mg total) by mouth 2 (two) times daily for 7 days, THEN 2 tablets (10 mg total) 2 (two) times daily. Increase your Bisoprolol to 7.5 mg two times a day for 7 days, if you are tolerating then increase to 10 mg two times a day going forward. IF you move to 10 mg two times a day okay to call the office to switch to the 10 mg tablets.[name] Kitchen   buPROPion (WELLBUTRIN XL) 150 MG 24 hr tablet Take 1 tablet (150 mg total) by mouth daily.   calcium carbonate (TUMS EX) 750 MG chewable tablet Chew 2 tablets by mouth daily as needed for heartburn.   Cholecalciferol 25 MCG (1000 UT) tablet Take 1,000 Units by mouth daily.   ELIQUIS 5 MG TABS tablet TAKE ONE TABLET TWICE DAILY   ezetimibe (ZETIA) 10 MG tablet Take 1 tablet (10 mg total) by mouth daily.   furosemide (LASIX) 40 MG tablet Take 1 tablet (40 mg total) by mouth daily. Take extra 40 mg as needed after lunch for abdominal swelling, leg swelling, or  shortness of breath   Menthol, Topical Analgesic, (BIOFREEZE EX) Apply 1 application. topically daily as needed (Neck pain).   metaxalone (SKELAXIN) 800 MG tablet Take 800 mg by mouth daily as needed for muscle spasms.   Multiple Vitamin (MULTIVITAMIN) capsule Take 1 capsule by mouth daily.   mupirocin ointment (BACTROBAN) 2 % Place 1 application  into the nose 2 (two) times daily.   naltrexone (DEPADE) 50 MG tablet Take 0.5 tablets (25 mg total) by mouth daily.   potassium chloride (KLOR-CON) 10 MEQ tablet TAKE 1 TABLET BY MOUTH DAILY   No facility-administered medications prior to visit.    Allergies  Allergen Reactions   Levofloxacin Other (See Comments)    Other reaction(s): Joint Pains  Other Reaction: OTHER REACTION  Other reaction(s): Joint Pains   Influenza Vaccine Recombinant Other (See Comments)   Influenza Vaccines Other (See Comments)    Bell's Palsy   Other Other (See Comments)   Oysters [Shellfish Allergy] Swelling    She states she had eaten them three days in a row and she developed swelling around her eyes.     Patient Care Team: [name], MD as PCP - General (Family Medicine) Minna Merritts, MD as PCP - Cardiology (Cardiology) Vickie Epley, MD as PCP - Electrophysiology (Cardiology) Sharlet Salina, MD as Referring Physician (Physical Medicine and Rehabilitation) [place], [facility] Dannielle Huh, Luvenia Heller, MD as Consulting Physician (Pulmonary Disease)  Dasher, Rayvon Char, MD (Dermatology) [name], DO as Referring Physician (Family Medicine) Germaine Pomfret, [facility] as Pharmacist (Pharmacist)  Review of Systems  All other systems reviewed and are negative.   Last CBC Lab Results  Component Value Date   WBC 8.6 11/04/2021   HGB 14.5 11/04/2021   HCT 41.7 11/04/2021   MCV 90 11/04/2021   MCH 31.3 11/04/2021   RDW 12.7 11/04/2021   PLT 298 59/56/3875   Last metabolic panel Lab Results  Component Value Date   GLUCOSE 101  (H) 11/04/2021   NA 140 11/04/2021   K 3.8 11/04/2021   CL 102 11/04/2021   CO2 23 11/04/2021   BUN 28 (H) 11/04/2021   CREATININE 1.13 (H) 11/04/2021   EGFR 49 (L) 11/04/2021   CALCIUM 9.7 11/04/2021   PROT CANCELED 08/16/2021   ALBUMIN CANCELED 08/16/2021   LABGLOB 2.5 08/03/2020   AGRATIO 1.7 08/03/2020   BILITOT CANCELED 08/16/2021   ALKPHOS CANCELED 08/16/2021   AST CANCELED 08/16/2021   ALT CANCELED 08/16/2021   ANIONGAP 8 10/25/2021   Last lipids Lab Results  Component Value Date   CHOL CANCELED 08/16/2021   HDL CANCELED 08/16/2021   LDLCALC 188 (H) 08/03/2020   TRIG CANCELED 08/16/2021   CHOLHDL 4.4 08/03/2020   Last hemoglobin A1c Lab Results  Component Value Date   HGBA1C 5.5 12/09/2019   Last thyroid functions Lab Results  Component Value Date   TSH 1.020 08/03/2020   T3TOTAL 80 12/09/2019   Last vitamin D Lab Results  Component Value Date   25OHVITD2 4.6 06/16/2021   25OHVITD3 49 06/16/2021   VD25OH 27.3 (L) 08/03/2020   Last vitamin B12 and Folate Lab Results  Component Value Date   VITAMINB12 1,246 (H) 06/16/2021        Objective    Vitals: There were no vitals taken for this visit. BP Readings from Last 3 Encounters:  02/22/22 120/75  01/26/22 130/80  12/17/21 130/80   Wt Readings from Last 3 Encounters:  02/22/22 229 lb 14.4 oz (104.3 kg)  01/26/22 225 lb 6 oz (102.2 kg)  12/17/21 226 lb 4 oz (102.6 kg)       Physical Exam ***  Most recent functional status assessment:    02/22/2022    9:53 AM  In your present state of health, do you have any difficulty performing the following activities:  Hearing? 0  Vision? 0  Difficulty concentrating or making decisions? 0  Walking or climbing stairs? 1  Dressing or bathing? 0  Doing errands, shopping? 0   Most recent fall risk assessment:    02/22/2022    9:52 AM  Fall Risk   Falls in the past year? 0  Number falls in past yr: 0  Injury with Fall? 0  Risk for fall due to  : History of fall(s)  Follow up Falls evaluation completed;Education provided;Falls prevention discussed    Most recent depression screenings:    02/22/2022    9:52 AM 08/16/2021    8:19 AM  PHQ 2/9 Scores  PHQ - 2 Score 1 0  PHQ- 9 Score 8 6   Most recent cognitive screening:    02/08/2021    9:11 AM  6CIT Screen  What Year? 0 points  What month? 0 points  What time? 0 points  Count back from 20 0 points  Months in reverse 0 points  Repeat phrase 0 points  Total Score 0 points   Most recent Audit-C alcohol use screening  02/22/2022    9:53 AM  Alcohol Use Disorder Test (AUDIT)  1. How often do you have a drink containing alcohol? 3  2. How many drinks containing alcohol do you have on a typical day when you are drinking? 0  3. How often do you have six or more drinks on one occasion? 0  AUDIT-C Score 3   A score of 3 or more in women, and 4 or more in men indicates increased risk for alcohol abuse, EXCEPT if all of the points are from question 1   No results found for any visits on 03/17/22.  Assessment & Plan     Annual wellness visit done today including the all of the following: Reviewed patient's Family Medical History Reviewed and updated list of patient's medical providers Assessment of cognitive impairment was done Assessed patient's functional ability Established a written schedule for health screening [place] Completed and Reviewed  Exercise Activities and Dietary recommendations  Goals      Reduce portion size     Recommend decreasing portion sizes for each meal (3) and add in 2 healthy snacks in between.       Track and Manage Fluids and Swelling-Heart Failure     Timeframe:  Long-Range Goal Priority:  High Start Date: 08/13/21                            Expected End Date: 08/14/22                      Follow Up within 90 days   - call office if I gain more than 2 pounds in one day or 5 pounds in one week - use salt in  moderation - watch for swelling in feet, ankles and legs every day - weigh myself daily    Why is this important?   It is important to check your weight daily and watch how much salt and liquids you have.  It will help you to manage your heart failure.    Notes:         Immunization History  Administered Date(s) Administered   Hepatitis A 11/25/1999, 09/19/2001   Hepatitis A, Adult 11/25/1999, 09/19/2001   IPV 02/11/2000   PFIZER(Purple Top)SARS-COV-2 Vaccination 06/14/2019, 07/05/2019, 04/15/2020   Pneumococcal Conjugate-13 10/06/2014   Pneumococcal Polysaccharide-23 12/01/2010   Td 02/02/1998   Tdap 09/11/2007, 02/27/2012   Typhoid Inactivated 02/11/2000   Zoster Recombinat (Shingrix) 02/06/2020, 05/29/2020    Health Maintenance  Topic Date Due   COVID-19 Vaccine (4 - Pfizer risk series) 06/10/2020   Medicare Annual Wellness (AWV)  02/08/2022   TETANUS/TDAP  02/26/2022   INFLUENZA VACCINE  08/14/2022 (Originally 12/14/2021)   Pneumonia Vaccine 60+ Years old  Completed   DEXA SCAN  Completed   Zoster Vaccines- Shingrix  Completed   HPV VACCINES  Aged Out     Discussed health benefits of physical activity, and encouraged her to engage in regular exercise appropriate for her age and condition.    ***  No follow-ups on file.     {provider attestation***:1}   Lavon Paganini, MD  [facility] [phone] (phone) [phone] (fax)  [place]

## 2022-03-17 ENCOUNTER — Encounter: Payer: Self-pay | Admitting: Family Medicine

## 2022-03-17 ENCOUNTER — Ambulatory Visit (INDEPENDENT_AMBULATORY_CARE_PROVIDER_SITE_OTHER): Payer: Medicare HMO | Admitting: Family Medicine

## 2022-03-17 VITALS — BP 130/81 | HR 90 | Temp 98.5°F | Resp 18 | Ht 66.0 in | Wt 226.0 lb

## 2022-03-17 DIAGNOSIS — R739 Hyperglycemia, unspecified: Secondary | ICD-10-CM | POA: Diagnosis not present

## 2022-03-17 DIAGNOSIS — E669 Obesity, unspecified: Secondary | ICD-10-CM

## 2022-03-17 DIAGNOSIS — I779 Disorder of arteries and arterioles, unspecified: Secondary | ICD-10-CM

## 2022-03-17 DIAGNOSIS — E7800 Pure hypercholesterolemia, unspecified: Secondary | ICD-10-CM | POA: Diagnosis not present

## 2022-03-17 DIAGNOSIS — Z0000 Encounter for general adult medical examination without abnormal findings: Secondary | ICD-10-CM | POA: Diagnosis not present

## 2022-03-17 DIAGNOSIS — E559 Vitamin D deficiency, unspecified: Secondary | ICD-10-CM

## 2022-03-17 DIAGNOSIS — I10 Essential (primary) hypertension: Secondary | ICD-10-CM

## 2022-03-17 NOTE — Assessment & Plan Note (Signed)
F/b Cardiology On medical therapy

## 2022-03-17 NOTE — Assessment & Plan Note (Signed)
Continue supplement Recheck level 

## 2022-03-17 NOTE — Assessment & Plan Note (Signed)
Well controlled Continue current medications Recheck metabolic panel F/u in 6 months  

## 2022-03-17 NOTE — Assessment & Plan Note (Signed)
Discussed importance of healthy weight management Discussed diet and exercise  

## 2022-03-17 NOTE — Assessment & Plan Note (Signed)
Reviewed last lipid panel Not currently on a statin Continue zetia Recheck FLP and CMP Discussed diet and exercise

## 2022-03-18 LAB — COMPREHENSIVE METABOLIC PANEL
ALT: 22 IU/L (ref 0–32)
AST: 28 IU/L (ref 0–40)
Albumin/Globulin Ratio: 1.7 (ref 1.2–2.2)
Albumin: 4.2 g/dL (ref 3.7–4.7)
Alkaline Phosphatase: 145 IU/L — ABNORMAL HIGH (ref 44–121)
BUN/Creatinine Ratio: 20 (ref 12–28)
BUN: 23 mg/dL (ref 8–27)
Bilirubin Total: 0.7 mg/dL (ref 0.0–1.2)
CO2: 25 mmol/L (ref 20–29)
Calcium: 9.5 mg/dL (ref 8.7–10.3)
Chloride: 102 mmol/L (ref 96–106)
Creatinine, Ser: 1.16 mg/dL — ABNORMAL HIGH (ref 0.57–1.00)
Globulin, Total: 2.5 g/dL (ref 1.5–4.5)
Glucose: 90 mg/dL (ref 70–99)
Potassium: 4.2 mmol/L (ref 3.5–5.2)
Sodium: 143 mmol/L (ref 134–144)
Total Protein: 6.7 g/dL (ref 6.0–8.5)
eGFR: 47 mL/min/{1.73_m2} — ABNORMAL LOW (ref >59)

## 2022-03-18 LAB — LIPID PANEL
Chol/HDL Ratio: 3.1 ratio (ref 0.0–4.4)
Cholesterol, Total: 162 mg/dL (ref 100–199)
HDL: 52 mg/dL (ref >39)
LDL Chol Calc (NIH): 93 mg/dL (ref 0–99)
Triglycerides: 94 mg/dL (ref 0–149)
VLDL Cholesterol Cal: 17 mg/dL (ref 5–40)

## 2022-03-18 LAB — HEMOGLOBIN A1C
Est. average glucose Bld gHb Est-mCnc: 126 mg/dL
Hgb A1c MFr Bld: 6 % — ABNORMAL HIGH (ref 4.8–5.6)

## 2022-03-18 LAB — VITAMIN D 25 HYDROXY (VIT D DEFICIENCY, FRACTURES): Vit D, 25-Hydroxy: 54.1 ng/mL (ref 30.0–100.0)

## 2022-03-23 ENCOUNTER — Telehealth: Payer: Self-pay | Admitting: Cardiology

## 2022-03-23 NOTE — Telephone Encounter (Signed)
Patient stated she was notified by [facility] that Dr. Lars Mage is not in her network and she would like Dr. Quentin Ore to fax a gap extension to [facility], fax# [phone].

## 2022-04-08 ENCOUNTER — Other Ambulatory Visit: Payer: Self-pay | Admitting: Cardiovascular Disease

## 2022-04-14 ENCOUNTER — Ambulatory Visit
Admission: RE | Admit: 2022-04-14 | Discharge: 2022-04-14 | Disposition: A | Discharge status: Home / Self Care | Payer: Medicare HMO | Source: Ambulatory Visit | Attending: Family Medicine | Admitting: Family Medicine

## 2022-04-14 DIAGNOSIS — Z1231 Encounter for screening mammogram for malignant neoplasm of breast: Secondary | ICD-10-CM | POA: Insufficient documentation

## 2022-04-18 DIAGNOSIS — D2261 Melanocytic nevi of right upper limb, including shoulder: Secondary | ICD-10-CM | POA: Diagnosis not present

## 2022-04-18 DIAGNOSIS — D2262 Melanocytic nevi of left upper limb, including shoulder: Secondary | ICD-10-CM | POA: Diagnosis not present

## 2022-04-18 DIAGNOSIS — Z85828 Personal history of other malignant neoplasm of skin: Secondary | ICD-10-CM | POA: Diagnosis not present

## 2022-04-18 DIAGNOSIS — X32XXXA Exposure to sunlight, initial encounter: Secondary | ICD-10-CM | POA: Diagnosis not present

## 2022-04-18 DIAGNOSIS — L570 Actinic keratosis: Secondary | ICD-10-CM | POA: Diagnosis not present

## 2022-04-18 DIAGNOSIS — D2272 Melanocytic nevi of left lower limb, including hip: Secondary | ICD-10-CM | POA: Diagnosis not present

## 2022-04-23 ENCOUNTER — Other Ambulatory Visit: Payer: Self-pay | Admitting: Family Medicine

## 2022-04-27 ENCOUNTER — Encounter: Payer: Medicare HMO | Admitting: Cardiology

## 2022-04-27 ENCOUNTER — Ambulatory Visit (INDEPENDENT_AMBULATORY_CARE_PROVIDER_SITE_OTHER): Payer: Medicare HMO

## 2022-04-27 DIAGNOSIS — I495 Sick sinus syndrome: Secondary | ICD-10-CM | POA: Diagnosis not present

## 2022-04-27 LAB — CUP PACEART REMOTE DEVICE CHECK
Battery Remaining Longevity: 114 mo
Battery Remaining Percentage: 100 %
Brady Statistic RA Percent Paced: 0 %
Brady Statistic RV Percent Paced: 13 %
Date Time Interrogation Session: 20231213041100
Implantable Lead Connection Status: 753985
Implantable Lead Connection Status: 753985
Implantable Lead Implant Date: 20230612
Implantable Lead Implant Date: 20230612
Implantable Lead Location: 753859
Implantable Lead Location: 753860
Implantable Lead Model: 7841
Implantable Lead Model: 7842
Implantable Lead Serial Number: 1182863
Implantable Lead Serial Number: 1274488
Implantable Pulse Generator Implant Date: 20230612
Lead Channel Impedance Value: 514 Ohm
Lead Channel Impedance Value: 678 Ohm
Lead Channel Pacing Threshold Amplitude: 0.6 V
Lead Channel Pacing Threshold Pulse Width: 0.4 ms
Lead Channel Setting Pacing Amplitude: 2.5 V
Lead Channel Setting Pacing Amplitude: 3.5 V
Lead Channel Setting Pacing Pulse Width: 0.4 ms
Lead Channel Setting Sensing Sensitivity: 2.5 mV
Pulse Gen Serial Number: 111954
Zone Setting Status: 755011

## 2022-04-28 ENCOUNTER — Ambulatory Visit: Payer: Medicare HMO | Admitting: Cardiology

## 2022-05-10 ENCOUNTER — Other Ambulatory Visit: Payer: Self-pay

## 2022-05-10 ENCOUNTER — Emergency Department: Payer: Medicare HMO

## 2022-05-10 ENCOUNTER — Ambulatory Visit: Payer: Self-pay

## 2022-05-10 ENCOUNTER — Inpatient Hospital Stay
Admission: EM | Admit: 2022-05-10 | Discharge: 2022-05-13 | DRG: 202 | Disposition: A | Discharge status: Home Health | Payer: Medicare HMO | Attending: Internal Medicine | Admitting: Internal Medicine

## 2022-05-10 DIAGNOSIS — I4891 Unspecified atrial fibrillation: Secondary | ICD-10-CM | POA: Diagnosis present

## 2022-05-10 DIAGNOSIS — Z961 Presence of intraocular lens: Secondary | ICD-10-CM | POA: Diagnosis present

## 2022-05-10 DIAGNOSIS — R059 Cough, unspecified: Secondary | ICD-10-CM | POA: Diagnosis not present

## 2022-05-10 DIAGNOSIS — R0602 Shortness of breath: Secondary | ICD-10-CM | POA: Diagnosis not present

## 2022-05-10 DIAGNOSIS — Z7901 Long term (current) use of anticoagulants: Secondary | ICD-10-CM

## 2022-05-10 DIAGNOSIS — M1610 Unilateral primary osteoarthritis, unspecified hip: Secondary | ICD-10-CM | POA: Diagnosis present

## 2022-05-10 DIAGNOSIS — J210 Acute bronchiolitis due to respiratory syncytial virus: Secondary | ICD-10-CM

## 2022-05-10 DIAGNOSIS — J9601 Acute respiratory failure with hypoxia: Secondary | ICD-10-CM | POA: Diagnosis not present

## 2022-05-10 DIAGNOSIS — Z96653 Presence of artificial knee joint, bilateral: Secondary | ICD-10-CM | POA: Diagnosis present

## 2022-05-10 DIAGNOSIS — I5031 Acute diastolic (congestive) heart failure: Secondary | ICD-10-CM | POA: Diagnosis present

## 2022-05-10 DIAGNOSIS — Z91013 Allergy to seafood: Secondary | ICD-10-CM

## 2022-05-10 DIAGNOSIS — I272 Pulmonary hypertension, unspecified: Secondary | ICD-10-CM | POA: Diagnosis present

## 2022-05-10 DIAGNOSIS — Z881 Allergy status to other antibiotic agents status: Secondary | ICD-10-CM

## 2022-05-10 DIAGNOSIS — J441 Chronic obstructive pulmonary disease with (acute) exacerbation: Secondary | ICD-10-CM | POA: Diagnosis present

## 2022-05-10 DIAGNOSIS — G4733 Obstructive sleep apnea (adult) (pediatric): Secondary | ICD-10-CM | POA: Diagnosis present

## 2022-05-10 DIAGNOSIS — Z887 Allergy status to serum and vaccine status: Secondary | ICD-10-CM

## 2022-05-10 DIAGNOSIS — Z8249 Family history of ischemic heart disease and other diseases of the circulatory system: Secondary | ICD-10-CM

## 2022-05-10 DIAGNOSIS — E7800 Pure hypercholesterolemia, unspecified: Secondary | ICD-10-CM | POA: Diagnosis present

## 2022-05-10 DIAGNOSIS — B338 Other specified viral diseases: Secondary | ICD-10-CM | POA: Diagnosis not present

## 2022-05-10 DIAGNOSIS — Z79899 Other long term (current) drug therapy: Secondary | ICD-10-CM

## 2022-05-10 DIAGNOSIS — Z6834 Body mass index (BMI) 34.0-34.9, adult: Secondary | ICD-10-CM

## 2022-05-10 DIAGNOSIS — I779 Disorder of arteries and arterioles, unspecified: Secondary | ICD-10-CM | POA: Diagnosis present

## 2022-05-10 DIAGNOSIS — Z87891 Personal history of nicotine dependence: Secondary | ICD-10-CM

## 2022-05-10 DIAGNOSIS — J440 Chronic obstructive pulmonary disease with acute lower respiratory infection: Secondary | ICD-10-CM | POA: Diagnosis present

## 2022-05-10 DIAGNOSIS — Z9841 Cataract extraction status, right eye: Secondary | ICD-10-CM

## 2022-05-10 DIAGNOSIS — K219 Gastro-esophageal reflux disease without esophagitis: Secondary | ICD-10-CM | POA: Diagnosis present

## 2022-05-10 DIAGNOSIS — Z9842 Cataract extraction status, left eye: Secondary | ICD-10-CM

## 2022-05-10 DIAGNOSIS — Z1152 Encounter for screening for COVID-19: Secondary | ICD-10-CM

## 2022-05-10 DIAGNOSIS — Z950 Presence of cardiac pacemaker: Secondary | ICD-10-CM

## 2022-05-10 DIAGNOSIS — G4700 Insomnia, unspecified: Secondary | ICD-10-CM | POA: Diagnosis present

## 2022-05-10 DIAGNOSIS — E739 Lactose intolerance, unspecified: Secondary | ICD-10-CM | POA: Diagnosis present

## 2022-05-10 DIAGNOSIS — I4819 Other persistent atrial fibrillation: Secondary | ICD-10-CM | POA: Diagnosis present

## 2022-05-10 DIAGNOSIS — E669 Obesity, unspecified: Secondary | ICD-10-CM | POA: Diagnosis present

## 2022-05-10 DIAGNOSIS — I110 Hypertensive heart disease with heart failure: Secondary | ICD-10-CM | POA: Diagnosis present

## 2022-05-10 HISTORY — DX: Acute bronchiolitis due to respiratory syncytial virus: J21.0

## 2022-05-10 LAB — CBC WITH DIFFERENTIAL/PLATELET
Abs Immature Granulocytes: 0.05 10*3/uL (ref 0.00–0.07)
Basophils Absolute: 0.1 10*3/uL (ref 0.0–0.1)
Basophils Relative: 1 %
Eosinophils Absolute: 0.2 10*3/uL (ref 0.0–0.5)
Eosinophils Relative: 2 %
HCT: 44.3 % (ref 36.0–46.0)
Hemoglobin: 14.3 g/dL (ref 12.0–15.0)
Immature Granulocytes: 1 %
Lymphocytes Relative: 13 %
Lymphs Abs: 1.3 10*3/uL (ref 0.7–4.0)
MCH: 30.4 pg (ref 26.0–34.0)
MCHC: 32.3 g/dL (ref 30.0–36.0)
MCV: 94.1 fL (ref 80.0–100.0)
Monocytes Absolute: 1.3 10*3/uL — ABNORMAL HIGH (ref 0.1–1.0)
Monocytes Relative: 13 %
Neutro Abs: 6.7 10*3/uL (ref 1.7–7.7)
Neutrophils Relative %: 70 %
Platelets: 214 10*3/uL (ref 150–400)
RBC: 4.71 MIL/uL (ref 3.87–5.11)
RDW: 13.9 % (ref 11.5–15.5)
WBC: 9.5 10*3/uL (ref 4.0–10.5)
nRBC: 0 % (ref 0.0–0.2)

## 2022-05-10 LAB — BASIC METABOLIC PANEL
Anion gap: 10 (ref 5–15)
BUN: 23 mg/dL (ref 8–23)
CO2: 24 mmol/L (ref 22–32)
Calcium: 9.3 mg/dL (ref 8.9–10.3)
Chloride: 106 mmol/L (ref 98–111)
Creatinine, Ser: 0.97 mg/dL (ref 0.44–1.00)
GFR, Estimated: 58 mL/min — ABNORMAL LOW (ref >60)
Glucose, Bld: 112 mg/dL — ABNORMAL HIGH (ref 70–99)
Potassium: 4 mmol/L (ref 3.5–5.1)
Sodium: 140 mmol/L (ref 135–145)

## 2022-05-10 LAB — RESP PANEL BY RT-PCR (RSV, FLU A&B, COVID)  RVPGX2
Influenza A by PCR: NEGATIVE
Influenza B by PCR: NEGATIVE
Resp Syncytial Virus by PCR: POSITIVE — AB
SARS Coronavirus 2 by RT PCR: NEGATIVE

## 2022-05-10 LAB — BRAIN NATRIURETIC PEPTIDE: B Natriuretic Peptide: 842.4 pg/mL — ABNORMAL HIGH (ref 0.0–100.0)

## 2022-05-10 LAB — TROPONIN I (HIGH SENSITIVITY)
Troponin I (High Sensitivity): 29 ng/L — ABNORMAL HIGH (ref <18)
Troponin I (High Sensitivity): 27 ng/L — ABNORMAL HIGH (ref <18)

## 2022-05-10 MED ORDER — ONDANSETRON HCL 4 MG PO TABS: 4.0000 mg | ORAL_TABLET | Freq: Four times a day (QID) | ORAL | Status: DC | PRN

## 2022-05-10 MED ORDER — BENZONATATE 100 MG PO CAPS
100.0000 mg | ORAL_CAPSULE | Freq: Once | ORAL | Status: AC
  Administered 2022-05-10: 100 mg via ORAL
  Filled 2022-05-10: qty 1

## 2022-05-10 MED ORDER — HYDROCOD POLI-CHLORPHE POLI ER 10-8 MG/5ML PO SUER: 5.0000 mL | Freq: Every evening | ORAL | Status: DC | PRN

## 2022-05-10 MED ORDER — GUAIFENESIN 100 MG/5ML PO LIQD
5.0000 mL | ORAL | Status: DC | PRN
  Administered 2022-05-12: 5 mL via ORAL
  Filled 2022-05-10: qty 10

## 2022-05-10 MED ORDER — SENNOSIDES-DOCUSATE SODIUM 8.6-50 MG PO TABS: 1.0000 | ORAL_TABLET | Freq: Every evening | ORAL | Status: DC | PRN

## 2022-05-10 MED ORDER — GUAIFENESIN 100 MG/5ML PO LIQD
5.0000 mL | Freq: Once | ORAL | Status: AC
  Administered 2022-05-10: 5 mL via ORAL
  Filled 2022-05-10: qty 10

## 2022-05-10 MED ORDER — CALCIUM CARBONATE ANTACID 500 MG PO CHEW: 3.0000 | CHEWABLE_TABLET | Freq: Every day | ORAL | Status: DC | PRN

## 2022-05-10 MED ORDER — BISOPROLOL FUMARATE 5 MG PO TABS
10.0000 mg | ORAL_TABLET | Freq: Two times a day (BID) | ORAL | Status: DC
  Administered 2022-05-10 – 2022-05-11 (×3): 10 mg via ORAL
  Filled 2022-05-10 (×4): qty 2

## 2022-05-10 MED ORDER — MELATONIN 5 MG PO TABS
5.0000 mg | ORAL_TABLET | Freq: Every evening | ORAL | Status: DC | PRN
  Administered 2022-05-12: 5 mg via ORAL
  Filled 2022-05-10: qty 1

## 2022-05-10 MED ORDER — IPRATROPIUM-ALBUTEROL 0.5-2.5 (3) MG/3ML IN SOLN
3.0000 mL | Freq: Once | RESPIRATORY_TRACT | Status: AC
  Administered 2022-05-10: 3 mL via RESPIRATORY_TRACT
  Filled 2022-05-10: qty 3

## 2022-05-10 MED ORDER — DILTIAZEM HCL 25 MG/5ML IV SOLN
10.0000 mg | Freq: Once | INTRAVENOUS | Status: AC
  Administered 2022-05-10: 10 mg via INTRAVENOUS
  Filled 2022-05-10: qty 5

## 2022-05-10 MED ORDER — ONDANSETRON HCL 4 MG/2ML IJ SOLN: 4.0000 mg | Freq: Four times a day (QID) | INTRAMUSCULAR | Status: DC | PRN

## 2022-05-10 MED ORDER — EZETIMIBE 10 MG PO TABS
10.0000 mg | ORAL_TABLET | Freq: Every day | ORAL | Status: DC
  Administered 2022-05-11 – 2022-05-13 (×3): 10 mg via ORAL
  Filled 2022-05-10 (×3): qty 1

## 2022-05-10 MED ORDER — ACETAMINOPHEN 325 MG PO TABS
650.0000 mg | ORAL_TABLET | Freq: Four times a day (QID) | ORAL | Status: DC | PRN
  Administered 2022-05-11 – 2022-05-12 (×3): 650 mg via ORAL
  Filled 2022-05-10 (×3): qty 2

## 2022-05-10 MED ORDER — APIXABAN 5 MG PO TABS
5.0000 mg | ORAL_TABLET | Freq: Two times a day (BID) | ORAL | Status: DC
  Administered 2022-05-10 – 2022-05-13 (×6): 5 mg via ORAL
  Filled 2022-05-10 (×6): qty 1

## 2022-05-10 MED ORDER — BISOPROLOL FUMARATE 5 MG PO TABS
10.0000 mg | ORAL_TABLET | Freq: Once | ORAL | Status: AC
  Administered 2022-05-10: 10 mg via ORAL
  Filled 2022-05-10: qty 2

## 2022-05-10 MED ORDER — ACETAMINOPHEN 500 MG PO TABS
1000.0000 mg | ORAL_TABLET | Freq: Once | ORAL | Status: AC
  Administered 2022-05-10: 1000 mg via ORAL
  Filled 2022-05-10: qty 2

## 2022-05-10 MED ORDER — METHYLPREDNISOLONE SODIUM SUCC 125 MG IJ SOLR
125.0000 mg | Freq: Once | INTRAMUSCULAR | Status: AC
  Administered 2022-05-10: 125 mg via INTRAVENOUS
  Filled 2022-05-10: qty 2

## 2022-05-10 MED ORDER — ACETAMINOPHEN 650 MG RE SUPP: 650.0000 mg | Freq: Four times a day (QID) | RECTAL | Status: DC | PRN

## 2022-05-10 NOTE — Hospital Course (Signed)
[name] is a 81 year old female with history of depression, anxiety, atrial fibrillation status post pacemaker, currently on bisoprolol, Eliquis, COPD, who presents emergency department for chief concerns of shortness of breath, cough for the past 6 days.  Initial vitals in the emergency department showed temperature of 98.9, respiration rate of 18, heart rate 108, blood pressure 124/99, SpO2 of 93% on room air.  Serum sodium is 140, potassium 4.0, chloride 106, bicarb 24, BUN of 23, serum creatinine of 0.97, EGFR 58, nonfasting blood glucose 112, WBC 9.5, hemoglobin 14.3, platelets 214.  High sensitivity troponin was initially 29 and on repeat is 27.  BNP is elevated at 842.4.  Patient tested positive for RSV by PCR.  ED treatment: Tessalon capsule 100 mg p.o. one-time dose, acetaminophen 1000 mg p.o. one-time dose, bisoprolol 10 mg p.o. one-time dose, guaifenesin, DuoNebs x 3, Solu-Medrol 125 mg IV one-time dose.  Patient's heart rate remains elevated in the 150s, EDP ordered diltiazem 10 mg IV one-time dose push.

## 2022-05-10 NOTE — Assessment & Plan Note (Signed)
-   CPAP nightly ordered 

## 2022-05-10 NOTE — Assessment & Plan Note (Addendum)
With coughing fits - Symptomatic support - Guaifenesin 100 mg/5 mL every 4 hours as needed for cough/loosen phlegm during the day, 2 days ordered - Tussionex nightly as needed for cough ordered -Steroids given significant wheezing, will discharge on taper

## 2022-05-10 NOTE — ED Triage Notes (Signed)
Pt in with co shortness of breath states has had a cough and shob, unsure of temp was told to come here from urgent care.

## 2022-05-10 NOTE — Assessment & Plan Note (Signed)
-   Melatonin 5 mg nightly as needed for sleep

## 2022-05-10 NOTE — ED Provider Notes (Signed)
[facility] Provider Note    Event Date/Time   First MD Initiated Contact with Patient 05/10/22 1633     (approximate)   History   Shortness of Breath   HPI  Lauren Lloyd is a 81 y.o. female  with CHF and COPD, A-fib with pacemaker on Eliquis who comes in with concerns for a productive cough.  Patient reports worsening shortness of breath with walking.  Patient reports that she has been feeling bad for the past 6 days.  She reports that her biggest symptoms are cough and wheezing.  She reports taking a few days of steroids that she had leftover but they were expired.  Denies any improvement with those.  She reports being compliant with her Eliquis.  Denies any falls hitting her head.  Denies any syncopal episodes.  Denies any abdominal pain.  She reports being compliant with her diuretic and reports feeling like she is euvolemic.  Denies any worsening swelling  Physical Exam   Triage Vital Signs: ED Triage Vitals  Enc Vitals Group     BP 05/10/22 1243 (!) 124/99     Pulse Rate 05/10/22 1243 (!) 108     Resp 05/10/22 1243 18     Temp 05/10/22 1243 99.2 F (37.3 C)     Temp Source 05/10/22 1243 Oral     SpO2 05/10/22 1243 93 %     Weight 05/10/22 1244 220 lb (99.8 kg)     Height 05/10/22 1244 '5\' 7"'$  (1.702 m)     Head Circumference --      Peak Flow --      Pain Score 05/10/22 1244 0     Pain Loc --      Pain Edu? --      Excl. in [place]? --     Most recent vital signs: Vitals:   05/10/22 1243  BP: (!) 124/99  Pulse: (!) 108  Resp: 18  Temp: 99.2 F (37.3 C)  SpO2: 93%     General: Awake, no distress.  CV:  Good peripheral perfusion.  Resp:  Normal effort.  Wheezing noted bilaterally.  ICD noted on chest wall Abd:  No distention.  Soft and nontender Other:  No swelling in legs.  No calf tenderness   ED Results / Procedures / Treatments   Labs (all labs ordered are listed, but only abnormal results are displayed) Labs Reviewed  RESP  PANEL BY RT-PCR (RSV, FLU A&B, COVID)  RVPGX2 - Abnormal; Notable for the following components:      Result Value   Resp Syncytial Virus by PCR POSITIVE (*)    All other components within normal limits  BRAIN NATRIURETIC PEPTIDE - Abnormal; Notable for the following components:   B Natriuretic Peptide 842.4 (*)    All other components within normal limits  BASIC METABOLIC PANEL - Abnormal; Notable for the following components:   Glucose, Bld 112 (*)    GFR, Estimated 58 (*)    All other components within normal limits  CBC WITH DIFFERENTIAL/PLATELET - Abnormal; Notable for the following components:   Monocytes Absolute 1.3 (*)    All other components within normal limits  TROPONIN I (HIGH SENSITIVITY) - Abnormal; Notable for the following components:   Troponin I (High Sensitivity) 29 (*)    All other components within normal limits  TROPONIN I (HIGH SENSITIVITY) - Abnormal; Notable for the following components:   Troponin I (High Sensitivity) 27 (*)    All other components within  normal limits     EKG  My interpretation of EKG:  Atrial fibrillation with a rate of 109 without any ST elevation or T wave inversions, normal intervals  RADIOLOGY I have reviewed the xray personally and interpreted.  Patient has some mild cardiomegaly with a pacemaker noted.  No obvious pneumonia.  Some bronchiolitis pattern.   PROCEDURES:  Critical Care performed: Yes, see critical care procedure note(s)  .1-3 Lead EKG Interpretation  Performed by: Vanessa Nyack, MD Authorized by: Vanessa Kenly, MD     Interpretation: abnormal     ECG rate:  130   ECG rate assessment: tachycardic     Rhythm: atrial fibrillation     Ectopy: none     Conduction: normal   .Critical Care  Performed by: Vanessa [place], MD Authorized by: Vanessa , MD   Critical care provider statement:    Critical care time (minutes):  30   Critical care was necessary to treat or prevent imminent or life-threatening  deterioration of the following conditions:  Cardiac failure   Critical care was time spent personally by me on the following activities:  Development of treatment plan with patient or surrogate, discussions with consultants, evaluation of patient's response to treatment, examination of patient, ordering and review of laboratory studies, ordering and review of radiographic studies, ordering and performing treatments and interventions, pulse oximetry, re-evaluation of patient's condition and review of old [place] ED: Medications  diltiazem (CARDIZEM) injection 10 mg (has no administration in time range)  ipratropium-albuterol (DUONEB) 0.5-2.5 (3) MG/3ML nebulizer solution 3 mL (3 mLs Nebulization Given 05/10/22 1743)  ipratropium-albuterol (DUONEB) 0.5-2.5 (3) MG/3ML nebulizer solution 3 mL (3 mLs Nebulization Given 05/10/22 1743)  ipratropium-albuterol (DUONEB) 0.5-2.5 (3) MG/3ML nebulizer solution 3 mL (3 mLs Nebulization Given 05/10/22 1743)  methylPREDNISolone sodium succinate (SOLU-MEDROL) 125 mg/2 mL injection 125 mg (125 mg Intravenous Given 05/10/22 1744)  benzonatate (TESSALON) capsule 100 mg (100 mg Oral Given 05/10/22 1744)  guaiFENesin (ROBITUSSIN) 100 MG/5ML liquid 5 mL (5 mLs Oral Given 05/10/22 1743)  acetaminophen (TYLENOL) tablet 1,000 mg (1,000 mg Oral Given 05/10/22 1744)  bisoprolol (ZEBETA) tablet 10 mg (10 mg Oral Given 05/10/22 1745)     IMPRESSION / MDM / [place] / ED COURSE  I reviewed the triage vital signs and the nursing notes.   Patient's presentation is most consistent with acute presentation with potential threat to life or bodily function.   Patient comes in with worsening shortness of breath cough in the setting of known COPD, CHF, A-fib.  Labs ordered evaluate for COVID, flu, ACS, CHF.  Patient does have wheezing noted bilaterally suspect this is from COPD exacerbation.  Will give DuoNebs, steroids.  She is compliant with  her Eliquis doubt PE.  Does not look euvolemic on examination. . Troponin is elevated but downtrending most likely secondary to demand.  RSV is positive.  BNP is elevated 840 but no pulmonary edema noted on chest x-ray.  Her BMP is reassuring.  CBC no white count elevation.  Patient's amatory sat is 90%.  She still sounds significantly wheezy on examination.  Her heart rates have gone up to 150 she did miss her dose of bisoprolol but I have given her her oral dose and her heart rates have still not come down.  Patient will be given a dose of IV diltiazem.  Discussed with patient given significantly wheezing audibly on examination and patient will be admitted to the  hospital for A-fib with RVR, COPD, RSV  The patient is on the cardiac monitor to evaluate for evidence of arrhythmia and/or significant heart rate changes.      FINAL CLINICAL IMPRESSION(S) / ED DIAGNOSES   Final diagnoses:  COPD exacerbation ([place])  RSV (respiratory syncytial virus infection)  Atrial fibrillation with RVR ([place])     Rx / DC Orders   ED Discharge Orders     None        Note:  This document was prepared using Dragon voice recognition software and may include unintentional dictation errors.   Vanessa [place], MD 05/10/22 [phone]

## 2022-05-10 NOTE — Assessment & Plan Note (Signed)
-   Resumed home bisoprolol 10 mg p.o. twice daily, Eliquis 5 mg p.o. twice daily

## 2022-05-10 NOTE — ED Notes (Signed)
Pt resting comfortably with family at bedside. No complaints at this time. Call bell in reach.

## 2022-05-10 NOTE — ED Notes (Signed)
Ambulated with patient. Pt maintained an o2 sat of 90.

## 2022-05-10 NOTE — H&P (Signed)
History and Physical   AKI BURDIN XHB:[id number] DOB: 08/10/1940 DOA: 05/10/2022  PCP: [name], MD  Outpatient Specialists: Dr. Quentin Ore, cardiology Patient coming from: Urgent care  I have personally briefly reviewed patient's old medical records in [place].  Chief Concern: cough, shortness of breath  HPI: [name] is a 81 year old female with history of depression, anxiety, atrial fibrillation status post pacemaker, currently on bisoprolol, Eliquis, COPD, who presents emergency department for chief concerns of shortness of breath, cough for the past 6 days.  Initial vitals in the emergency department showed temperature of 98.9, respiration rate of 18, heart rate 108, blood pressure 124/99, SpO2 of 93% on room air.  Serum sodium is 140, potassium 4.0, chloride 106, bicarb 24, BUN of 23, serum creatinine of 0.97, EGFR 58, nonfasting blood glucose 112, WBC 9.5, hemoglobin 14.3, platelets 214.  High sensitivity troponin was initially 29 and on repeat is 27.  BNP is elevated at 842.4.  Patient tested positive for RSV by PCR.  ED treatment: Tessalon capsule 100 mg p.o. one-time dose, acetaminophen 1000 mg p.o. one-time dose, bisoprolol 10 mg p.o. one-time dose, guaifenesin, DuoNebs x 3, Solu-Medrol 125 mg IV one-time dose.  Patient's heart rate remains elevated in the 150s, EDP ordered diltiazem 10 mg IV one-time dose push. ---------------------- At bedside, she is able to tell me her name, age, current year.  She started having cough fits and shortness of breath last Wednesday, 05/04/22. It got worse on Friday. She denies having fever at home. She denies known sick contacts. She denies nausea, vomiting, chest pain, dysuria, diarrhea, hematuria.   Social history: She lives by himself. She deneis tobacco, etoh, recreational drug. She is retired and formerly worked as a Garment/textile technologist.   ROS: Constitutional: no weight change, no fever ENT/Mouth: no sore throat,  no rhinorrhea Eyes: no eye pain, no vision changes Cardiovascular: no chest pain, no dyspnea,  no edema, no palpitations Respiratory: + cough, no sputum, no wheezing Gastrointestinal: no nausea, no vomiting, no diarrhea, no constipation Genitourinary: no urinary incontinence, no dysuria, no hematuria Musculoskeletal: no arthralgias, no myalgias Skin: no skin lesions, no pruritus, Neuro: + weakness, no loss of consciousness, no syncope Psych: no anxiety, no depression, + decrease appetite Heme/Lymph: no bruising, no bleeding  ED Course: Discussed with emergency medicine provider, patient requiring hospitalization for chief concerns of atrial fibrillation with RVR.  Assessment/Plan  Principal Problem:   Atrial fibrillation with RVR (HCC) Active Problems:   Hypercholesteremia   Primary osteoarthritis of one hip   Pulmonary hypertension, unspecified (HCC)   Persistent atrial fibrillation (HCC)   Obesity (BMI 30-39.9)   Insomnia   Chronic anticoagulation (Eliquis)   Carotid artery disease (HCC)   RSV (acute bronchiolitis due to respiratory syncytial virus)   Assessment and Plan:  * Atrial fibrillation with RVR ([place]) - Presumed secondary to RSV in setting of patient missing bisoprolol home dosing due to her children taking her to the hospital right away - Symptomatic support - Resumed home bisoprolol, Eliquis - Admit to telemetry medical, observation  RSV (acute bronchiolitis due to respiratory syncytial virus) With coughing fits - Symptomatic support - Guaifenesin 100 mg/5 mL every 4 hours as needed for cough/loosen phlegm during the day, 2 days ordered - Tussionex nightly as needed for cough ordered  Insomnia - Melatonin 5 mg nightly as needed for sleep  Persistent atrial fibrillation ([place]) - Resumed home bisoprolol 10 mg p.o. twice daily, Eliquis 5 mg p.o. twice daily  Pulmonary hypertension, unspecified ([place]) - CPAP nightly ordered  Hypercholesteremia - Resume home  ezetimibe 10 mg daily  Chart reviewed.   DVT prophylaxis: eliquis 5 mg bid Code Status: full code Diet: heart healthy Family Communication: no Disposition Plan: pending clinical  Consults called: none at this time Admission status: telemetry medical  Past Medical History:  Diagnosis Date   (HFpEF) heart failure with preserved ejection fraction ([place])    a. 05/2018 Echo: EF 55-60%, no rwma, mild to mod MR. Nl RV fxn. Mod TR. PASP 42mHg.   Arthritis    knees, Hands   Arthritis of knee    Back pain    Carotid arterial disease ([place]    a. 03/2019 Carotid U/S: <50% bilat ICA stenoses.   Cholelithiasis    a. 10/2018 noted on CT.   Edema, lower extremity    Fatty liver    GERD (gastroesophageal reflux disease)    History of stress test    a. 06/2018 MV: EF 59%, no ischemia/infarct. Low risk.   Knee pain    Lactose intolerance    Mitral regurgitation    a. 05/2018 Echo: mild to mod MR.   Multinodular goiter    Obesity    OSA (obstructive sleep apnea)    PAF (paroxysmal atrial fibrillation) ([place]    a.  Diagnosed 12/19; b. 05/2018 s/p DCCV; c. 03/2019 & 05/2019 recurrent AFib-->managed w/ amio load; d. CHADS2VASc = 6 (CHF, HTN, age x 2, vascular disease, female)-->Eliquis & amio 100 qd.   PAH (pulmonary artery hypertension) (HCC)    Scoliosis    SOB (shortness of breath)    Swallowing difficulty    Past Surgical History:  Procedure Laterality Date   CARDIOVERSION N/A 06/15/2018   Procedure: CARDIOVERSION (CATH LAB);  Surgeon: GMinna Merritts MD;  Location: [facility] ORS;  Service: Cardiovascular;  Laterality: N/A;   CARDIOVERSION N/A 02/18/2021   Procedure: CARDIOVERSION;  Surgeon: GMinna Merritts MD;  Location: [facility] ORS;  Service: Cardiovascular;  Laterality: N/A;   CATARACT EXTRACTION W/PHACO Right 01/25/2016   Procedure: CATARACT EXTRACTION PHACO AND INTRAOCULAR LENS PLACEMENT ([place];  Surgeon: ARonnell Freshwater MD;  [place]  Service: Ophthalmology;   Laterality: Right;  RIGHT   CATARACT EXTRACTION W/PHACO Left 02/22/2016   Procedure: CATARACT EXTRACTION PHACO AND INTRAOCULAR LENS PLACEMENT ([place];  Surgeon: ARonnell Freshwater MD;  [place] [facility]  Service: Ophthalmology;  Laterality: Left;  LEFT   HAMMER TOE SURGERY  05/16/2008   KNEE ARTHROSCOPY Right 05/16/2002   PACEMAKER IMPLANT N/A 10/25/2021   Procedure: PACEMAKER IMPLANT;  Surgeon: LVickie Epley MD;  [facility];  Service: Cardiovascular;  Laterality: N/A;   REPLACEMENT TOTAL KNEE Right 05/17/2007   [facility]  SKIN GRAFT Left 04/08/2013   Done on left index finger   TONSILLECTOMY  05/16/1944   TOTAL KNEE ARTHROPLASTY Left 12/02/2020   Social History:  reports that she quit smoking about 33 years ago. Her smoking use included cigarettes. She has a 30.00 pack-year smoking history. She has never used smokeless tobacco. She reports current alcohol use of about 7.0 standard drinks of alcohol per week. She reports that she does not use drugs.  Allergies  Allergen Reactions   Levofloxacin Other (See Comments)    Other reaction(s): Joint Pains  Other Reaction: OTHER REACTION  Other reaction(s): Joint Pains   Influenza Vaccine Recombinant Other (See Comments)   Influenza Vaccines Other (See Comments)    Bell's Palsy  Other Other (See Comments)   Oysters [Shellfish Allergy] Swelling    She states she had eaten them three days in a row and she developed swelling around her eyes.    Family History  Problem Relation Age of Onset   Hyperlipidemia Sister    Atrial fibrillation Sister    Transient ischemic attack Mother    Heart disease Mother    Heart attack Father    High blood pressure Father    Stroke Father    Healthy Brother    Breast cancer Sister 28   Hyperlipidemia Sister    Atrial fibrillation Sister    Family history: Family history reviewed and not pertinent  Prior to Admission medications    Medication Sig Start Date End Date Taking? Authorizing Provider  acetaminophen (TYLENOL) 325 MG tablet Take 650 mg by mouth every 6 (six) hours as needed for moderate pain.    [provider]  bisoprolol (ZEBETA) 5 MG tablet Take 1.5 tablets (7.5 mg total) by mouth 2 (two) times daily for 7 days, THEN 2 tablets (10 mg total) 2 (two) times daily. Increase your Bisoprolol to 7.5 mg two times a day for 7 days, if you are tolerating then increase to 10 mg two times a day going forward. IF you move to 10 mg two times a day okay to call the office to switch to the 10 mg tablets.. 01/26/22 01/28/23  Vickie Epley, MD  buPROPion (WELLBUTRIN XL) 150 MG 24 hr tablet TAKE 1 TABLET BY MOUTH DAILY 04/25/22   Bacigalupo, Dionne Bucy, MD  calcium carbonate (TUMS EX) 750 MG chewable tablet Chew 2 tablets by mouth daily as needed for heartburn. 12/07/20   [provider]  Cholecalciferol 25 MCG (1000 UT) tablet Take 1,000 Units by mouth daily.    [provider]  ELIQUIS 5 MG TABS tablet TAKE ONE TABLET TWICE DAILY 01/12/22   Minna Merritts, MD  ezetimibe (ZETIA) 10 MG tablet Take 1 tablet (10 mg total) by mouth daily. 08/16/21   [name], MD  furosemide (LASIX) 40 MG tablet TAKE 1 TABLET BY MOUTH DAILY. TAKE AN EXTRA TABLET AS NEEDED AFTER LUNCH FOR ABDOMINAL SWELLING, LEG SWELLING OR SHORTNESS OF BREATH 04/11/22   Gollan, Kathlene November, MD  Menthol, Topical Analgesic, (BIOFREEZE EX) Apply 1 application. topically daily as needed (Neck pain).    [provider]  metaxalone (SKELAXIN) 800 MG tablet Take 800 mg by mouth daily as needed for muscle spasms.    [provider]  Multiple Vitamin (MULTIVITAMIN) capsule Take 1 capsule by mouth daily.    [provider]  mupirocin ointment (BACTROBAN) 2 % Place 1 application  into the nose 2 (two) times daily. 10/26/21   Shirley Friar, PA-C  naltrexone (DEPADE) 50 MG tablet TAKE 1/2 TABLET BY MOUTH DAILY  04/25/22   [name], MD  potassium chloride (KLOR-CON) 10 MEQ tablet TAKE 1 TABLET BY MOUTH DAILY 04/11/22   Minna Merritts, MD   Physical Exam: Vitals:   05/10/22 1845 05/10/22 1900 05/10/22 1915 05/10/22 2219  BP:  (!) 149/87  124/65  Pulse: (!) 130 (!) 107 (!) 119 (!) 116  Resp:  (!) 24 (!) 21   Temp:    (!) 97.4 F (36.3 C)  TempSrc:      SpO2: 91% 93% 94% 93%  Weight:      Height:       Constitutional: appears younger than chronological age, NAD, calm, comfortable Eyes:  PERRL, lids and conjunctivae normal ENMT: Mucous membranes are moist. Posterior pharynx clear of any exudate or lesions. Age-appropriate dentition. Hearing appropriate Neck: normal, supple, no masses, no thyromegaly Respiratory: clear to auscultation bilaterally, no wheezing, no crackles. Normal respiratory effort. No accessory muscle use.  Cardiovascular: Regular rate and rhythm, no murmurs / rubs / gallops. No extremity edema. 2+ pedal pulses. No carotid bruits.  Abdomen: obese abdomen, no tenderness, no masses palpated, no hepatosplenomegaly. Bowel sounds positive.  Musculoskeletal: no clubbing / cyanosis. No joint deformity upper and lower extremities. Good ROM, no contractures, no atrophy. Normal muscle tone.  Skin: no rashes, lesions, ulcers. No induration Neurologic: Sensation intact. Strength 5/5 in all 4.  Psychiatric: Normal judgment and insight. Alert and oriented x 3. Normal mood.   EKG: independently reviewed, showing atrial fibrillation with RVR, rate of 109, QTc 463  Chest x-ray on Admission: I personally reviewed and I agree with radiologist reading as below.  DG Chest 2 View  Result Date: 05/10/2022 CLINICAL DATA:  Shortness of breath and cough EXAM: CHEST - 2 VIEW COMPARISON:  10/26/2021 FINDINGS: Dual lead pacemaker as seen previously. Mild cardiomegaly. Aortic atherosclerosis. Bronchial thickening pattern consistent with bronchitis. No consolidation, collapse or effusion. No  acute bone finding. IMPRESSION: Bronchitis pattern. No consolidation or collapse. Electronically Signed   By: Nelson Chimes M.D.   On: 05/10/2022 13:20    Labs on Admission: I have personally reviewed following labs  CBC: Recent Labs  Lab 05/10/22 1255  WBC 9.5  NEUTROABS 6.7  HGB 14.3  HCT 44.3  MCV 94.1  PLT 671   Basic Metabolic Panel: Recent Labs  Lab 05/10/22 1255  NA 140  K 4.0  CL 106  CO2 24  GLUCOSE 112*  BUN 23  CREATININE 0.97  CALCIUM 9.3   GFR: Estimated Creatinine Clearance: 54.3 mL/min (by C-G formula based on SCr of 0.97 mg/dL).  Urine analysis:    Component Value Date/Time   COLORURINE YELLOW (A) 11/05/2018 1319   APPEARANCEUR CLEAR (A) 11/05/2018 1319   LABSPEC 1.041 (H) 11/05/2018 1319   PHURINE 5.0 11/05/2018 1319   GLUCOSEU NEGATIVE 11/05/2018 1319   HGBUR NEGATIVE 11/05/2018 1319   BILIRUBINUR NEGATIVE 11/05/2018 1319   BILIRUBINUR Negative 03/06/2018 1001   KETONESUR NEGATIVE 11/05/2018 1319   PROTEINUR NEGATIVE 11/05/2018 1319   UROBILINOGEN 0.2 03/06/2018 1001   NITRITE NEGATIVE 11/05/2018 1319   LEUKOCYTESUR NEGATIVE 11/05/2018 1319   This document was prepared using Dragon Voice Recognition software and may include unintentional dictation errors.  Dr. Tobie Poet [facility]  If 7PM-7AM, please contact overnight-coverage provider If 7AM-7PM, please contact day coverage provider [email or web address]  05/10/2022, 10:30 PM

## 2022-05-10 NOTE — Assessment & Plan Note (Addendum)
-   Presumed secondary to RSV in setting of patient missing bisoprolol home dosing due to her children taking her to the hospital right away. Patient tells me that it has been hard to sometimes keep her heart rate down and she is not always aware when she is in rapid ventricular rate.  Increased Zebeta to 20 mg twice daily (from 10 mg) which has improved her heart rate without hypotension or bradycardia

## 2022-05-10 NOTE — ED Provider Triage Note (Signed)
Emergency Medicine Provider Triage Evaluation Note  Lauren Lloyd , a 81 y.o. female  was evaluated in triage.  Pt complains of cough, SOB. Reports that she has had a productive cough. No nasal congestion. Reports that she feels SOB with this. Reports SOB is worse with walking. Reports that she went to walk-in clinic today. Reports that she has CHF and COPD.   Review of Systems  Positive: Cough, SOB Negative: fever  Physical Exam  There were no vitals taken for this visit. Gen:   Awake, no distress   Resp:  Normal effort  MSK:   Moves extremities without difficulty  Other:    Medical Decision Making  Medically screening exam initiated at 12:40 PM.  Appropriate orders placed.  Lauren Lloyd was informed that the remainder of the evaluation will be completed by another provider, this initial triage assessment does not replace that evaluation, and the importance of remaining in the ED until their evaluation is complete.     Marquette Old, PA-C 05/10/22 1243

## 2022-05-10 NOTE — Assessment & Plan Note (Signed)
-   Resume home ezetimibe 10 mg daily

## 2022-05-10 NOTE — Telephone Encounter (Signed)
    Chief Complaint: Cough, SOB, wheezing. Refuses ED. Symptoms: Above Frequency: Friday Pertinent Negatives: Patient denies fever Disposition: '[x]'$ ED /'[]'$ Urgent Care (no appt availability in office) / '[]'$ Appointment(In office/virtual)/ '[]'$  [facility]/ '[]'$ Home Care/ '[]'$ Refused Recommended Disposition /'[]'$ [facility]/ '[]'$  Follow-up with PCP Additional Notes: Pt. States "I'll call my heart doctor or go to UC."  Reason for Disposition  Patient sounds very sick or weak to the triager  Answer Assessment - Initial Assessment Questions 1. ONSET: "When did the cough begin?"      Friday 2. SEVERITY: "How bad is the cough today?"      Severe 3. SPUTUM: "Describe the color of your sputum" (none, dry cough; clear, white, yellow, green)     Yellow 4. HEMOPTYSIS: "Are you coughing up any blood?" If so ask: "How much?" (flecks, streaks, tablespoons, etc.)     No 5. DIFFICULTY BREATHING: "Are you having difficulty breathing?" If Yes, ask: "How bad is it?" (e.g., mild, moderate, severe)    - MILD: No SOB at rest, mild SOB with walking, speaks normally in sentences, can lie down, no retractions, pulse < 100.    - MODERATE: SOB at rest, SOB with minimal exertion and prefers to sit, cannot lie down flat, speaks in phrases, mild retractions, audible wheezing, pulse 100-120.    - SEVERE: Very SOB at rest, speaks in single words, struggling to breathe, sitting hunched forward, retractions, pulse > 120      Moderate 6. FEVER: "Do you have a fever?" If Yes, ask: "What is your temperature, how was it measured, and when did it start?"     No 7. CARDIAC HISTORY: "Do you have any history of heart disease?" (e.g., heart attack, congestive heart failure)      Yes 8. LUNG HISTORY: "Do you have any history of lung disease?"  (e.g., pulmonary embolus, asthma, emphysema)     No 9. PE RISK FACTORS: "Do you have a history of blood clots?" (or: recent major surgery, recent prolonged travel, bedridden)      No 10. OTHER SYMPTOMS: "Do you have any other symptoms?" (e.g., runny nose, wheezing, chest pain)       Wheezing 11. PREGNANCY: "Is there any chance you are pregnant?" "When was your last menstrual period?"       No 12. TRAVEL: "Have you traveled out of the country in the last month?" (e.g., travel history, exposures)       No  Protocols used: Cough - Acute Productive-A-AH

## 2022-05-11 ENCOUNTER — Telehealth: Payer: Self-pay | Admitting: Cardiology

## 2022-05-11 ENCOUNTER — Ambulatory Visit: Payer: Medicare HMO | Admitting: Family Medicine

## 2022-05-11 DIAGNOSIS — J9601 Acute respiratory failure with hypoxia: Secondary | ICD-10-CM | POA: Diagnosis present

## 2022-05-11 DIAGNOSIS — Z8249 Family history of ischemic heart disease and other diseases of the circulatory system: Secondary | ICD-10-CM | POA: Diagnosis not present

## 2022-05-11 DIAGNOSIS — Z96653 Presence of artificial knee joint, bilateral: Secondary | ICD-10-CM | POA: Diagnosis not present

## 2022-05-11 DIAGNOSIS — I5031 Acute diastolic (congestive) heart failure: Secondary | ICD-10-CM | POA: Diagnosis present

## 2022-05-11 DIAGNOSIS — G4733 Obstructive sleep apnea (adult) (pediatric): Secondary | ICD-10-CM | POA: Diagnosis not present

## 2022-05-11 DIAGNOSIS — E669 Obesity, unspecified: Secondary | ICD-10-CM | POA: Diagnosis not present

## 2022-05-11 DIAGNOSIS — Z1152 Encounter for screening for COVID-19: Secondary | ICD-10-CM | POA: Diagnosis not present

## 2022-05-11 DIAGNOSIS — Z9842 Cataract extraction status, left eye: Secondary | ICD-10-CM | POA: Diagnosis not present

## 2022-05-11 DIAGNOSIS — J210 Acute bronchiolitis due to respiratory syncytial virus: Secondary | ICD-10-CM | POA: Diagnosis not present

## 2022-05-11 DIAGNOSIS — I4819 Other persistent atrial fibrillation: Secondary | ICD-10-CM | POA: Diagnosis not present

## 2022-05-11 DIAGNOSIS — Z9841 Cataract extraction status, right eye: Secondary | ICD-10-CM | POA: Diagnosis not present

## 2022-05-11 DIAGNOSIS — K219 Gastro-esophageal reflux disease without esophagitis: Secondary | ICD-10-CM | POA: Diagnosis not present

## 2022-05-11 DIAGNOSIS — Z79899 Other long term (current) drug therapy: Secondary | ICD-10-CM | POA: Diagnosis not present

## 2022-05-11 DIAGNOSIS — Z87891 Personal history of nicotine dependence: Secondary | ICD-10-CM | POA: Diagnosis not present

## 2022-05-11 DIAGNOSIS — G4700 Insomnia, unspecified: Secondary | ICD-10-CM | POA: Diagnosis not present

## 2022-05-11 DIAGNOSIS — J441 Chronic obstructive pulmonary disease with (acute) exacerbation: Secondary | ICD-10-CM | POA: Diagnosis not present

## 2022-05-11 DIAGNOSIS — Z950 Presence of cardiac pacemaker: Secondary | ICD-10-CM | POA: Diagnosis not present

## 2022-05-11 DIAGNOSIS — Z7901 Long term (current) use of anticoagulants: Secondary | ICD-10-CM | POA: Diagnosis not present

## 2022-05-11 DIAGNOSIS — B338 Other specified viral diseases: Secondary | ICD-10-CM | POA: Diagnosis not present

## 2022-05-11 DIAGNOSIS — Z6834 Body mass index (BMI) 34.0-34.9, adult: Secondary | ICD-10-CM | POA: Diagnosis not present

## 2022-05-11 DIAGNOSIS — J440 Chronic obstructive pulmonary disease with acute lower respiratory infection: Secondary | ICD-10-CM | POA: Diagnosis not present

## 2022-05-11 DIAGNOSIS — Z961 Presence of intraocular lens: Secondary | ICD-10-CM | POA: Diagnosis not present

## 2022-05-11 DIAGNOSIS — E7800 Pure hypercholesterolemia, unspecified: Secondary | ICD-10-CM | POA: Diagnosis not present

## 2022-05-11 DIAGNOSIS — I4891 Unspecified atrial fibrillation: Secondary | ICD-10-CM | POA: Diagnosis not present

## 2022-05-11 DIAGNOSIS — I110 Hypertensive heart disease with heart failure: Secondary | ICD-10-CM | POA: Diagnosis not present

## 2022-05-11 DIAGNOSIS — E739 Lactose intolerance, unspecified: Secondary | ICD-10-CM | POA: Diagnosis not present

## 2022-05-11 LAB — CBC
HCT: 44.1 % (ref 36.0–46.0)
Hemoglobin: 14.1 g/dL (ref 12.0–15.0)
MCH: 30.1 pg (ref 26.0–34.0)
MCHC: 32 g/dL (ref 30.0–36.0)
MCV: 94 fL (ref 80.0–100.0)
Platelets: 191 10*3/uL (ref 150–400)
RBC: 4.69 MIL/uL (ref 3.87–5.11)
RDW: 13.9 % (ref 11.5–15.5)
WBC: 6.2 10*3/uL (ref 4.0–10.5)
nRBC: 0 % (ref 0.0–0.2)

## 2022-05-11 LAB — MAGNESIUM: Magnesium: 2 mg/dL (ref 1.7–2.4)

## 2022-05-11 LAB — BASIC METABOLIC PANEL
Anion gap: 9 (ref 5–15)
BUN: 26 mg/dL — ABNORMAL HIGH (ref 8–23)
CO2: 24 mmol/L (ref 22–32)
Calcium: 9.2 mg/dL (ref 8.9–10.3)
Chloride: 109 mmol/L (ref 98–111)
Creatinine, Ser: 0.96 mg/dL (ref 0.44–1.00)
GFR, Estimated: 59 mL/min — ABNORMAL LOW (ref >60)
Glucose, Bld: 202 mg/dL — ABNORMAL HIGH (ref 70–99)
Potassium: 3.6 mmol/L (ref 3.5–5.1)
Sodium: 142 mmol/L (ref 135–145)

## 2022-05-11 LAB — PHOSPHORUS: Phosphorus: 3.9 mg/dL (ref 2.5–4.6)

## 2022-05-11 MED ORDER — PREDNISONE 50 MG PO TABS
50.0000 mg | ORAL_TABLET | Freq: Every day | ORAL | Status: DC
  Administered 2022-05-11 – 2022-05-13 (×3): 50 mg via ORAL
  Filled 2022-05-11: qty 1
  Filled 2022-05-11: qty 3
  Filled 2022-05-11: qty 1

## 2022-05-11 MED ORDER — LEVALBUTEROL HCL 0.63 MG/3ML IN NEBU
0.6300 mg | INHALATION_SOLUTION | Freq: Four times a day (QID) | RESPIRATORY_TRACT | Status: DC | PRN
  Administered 2022-05-11 – 2022-05-12 (×2): 0.63 mg via RESPIRATORY_TRACT
  Filled 2022-05-11 (×2): qty 3

## 2022-05-11 MED ORDER — METOPROLOL TARTRATE 5 MG/5ML IV SOLN
5.0000 mg | INTRAVENOUS | Status: AC
  Administered 2022-05-11: 5 mg via INTRAVENOUS
  Filled 2022-05-11: qty 5

## 2022-05-11 MED ORDER — FUROSEMIDE 10 MG/ML IJ SOLN
20.0000 mg | Freq: Two times a day (BID) | INTRAMUSCULAR | Status: DC
  Administered 2022-05-11 – 2022-05-12 (×3): 20 mg via INTRAVENOUS
  Filled 2022-05-11 (×3): qty 4

## 2022-05-11 NOTE — Assessment & Plan Note (Signed)
Secondary to RSV and CHF.  Intermittent, especially with ambulation.  Continue diuresis and supportive care.  Improved and no episodes of hypoxia since 12/27, even with ambulation

## 2022-05-11 NOTE — Assessment & Plan Note (Signed)
Meets criteria for BMI greater than 30 

## 2022-05-11 NOTE — ED Notes (Signed)
Pt resting comfortably at this time with call bell in reach. VSS

## 2022-05-11 NOTE — Telephone Encounter (Signed)
New Message:      Patient says she is ready to schedule her Ablation please.

## 2022-05-11 NOTE — ED Notes (Addendum)
While walking PT on O2 sensor she dropped down to the lowest 89 and stayed in the 90-92 range she did experience shortness of breath and felt like heart was racing while walking Hr jumped up to 145's-150's. Rn aware.

## 2022-05-11 NOTE — Progress Notes (Signed)
[facility] Progress Note  Patient: Lauren Lloyd    ZOX:[id number]  DOA: 05/10/2022    Date of Service: the patient was seen and examined on 05/11/2022  Brief hospital course: 81 year old female with past medical history of atrial fibrillation on Eliquis status post pacemaker, COPD and obesity who presented to the emergency room on 12/26 with complaints of shortness of breath and cough x 6 days.  Patient found to be positive for RSV and also found to be in atrial fibrillation with rapid ventricular rate.  Patient brought in for overnight observation.  By 12/27 morning, feeling better with heart rate somewhat stable.  She was ambulated and while oxygen saturations stayed normal, heart rate went back into rapid ventricular rate into the 140s.   Assessment and Plan: * Atrial fibrillation with RVR ([place]) - Presumed secondary to RSV in setting of patient missing bisoprolol home dosing due to her children taking her to the hospital right away. Patient tells me that it has been hard to sometimes keep her heart rate down and she is not always aware when she is in rapid ventricular rate.  Will see if we can adjust her chronotropic medications to keep her heart rate more stable  Acute respiratory failure with hypoxia ([place]) Secondary to RSV and CHF.  Intermittent, especially with ambulation.  Continue diuresis and supportive care  RSV (acute bronchiolitis due to respiratory syncytial virus) With coughing fits - Symptomatic support - Guaifenesin 100 mg/5 mL every 4 hours as needed for cough/loosen phlegm during the day, 2 days ordered - Tussionex nightly as needed for cough ordered -Steroids given significant wheezing  Acute diastolic (congestive) heart failure (HCC) Mild, secondary to A-fib.  Have started IV Lasix.  Pulmonary hypertension, unspecified ([place]) - CPAP nightly ordered  Insomnia - Melatonin 5 mg nightly as needed for sleep  Hypercholesteremia - Resume home ezetimibe 10 mg  daily  Obesity (BMI 30-39.9) Meets criteria for BMI greater than 30  Persistent atrial fibrillation (HCC) - Resumed home bisoprolol 10 mg p.o. twice daily, Eliquis 5 mg p.o. twice daily       Body mass index is 34.46 kg/m.        Consultants: None  Procedures: None   Antimicrobials: None  Code Status: Full code   Subjective: Feels okay a little short of breath with activity  Objective: Noted earlier hypoxia Vitals:   05/11/22 1210 05/11/22 1408  BP:  (!) 140/87  Pulse:  91  Resp:  16  Temp: 97.8 F (36.6 C)   SpO2:  94%   No intake or output data in the 24 hours ending 05/11/22 1418 Filed Weights   05/10/22 1244  Weight: 99.8 kg   Body mass index is 34.46 kg/m.  Exam:  General: Alert and oriented x 3, no acute distress HEENT: Normocephalic, atraumatic, mucous membranes are moist Cardiovascular: Irregular rhythm, currently rate controlled Respiratory: Scattered rhonchi, bilateral end expiratory wheeze Abdomen: Soft, nontender, nondistended, positive bowel sounds Musculoskeletal: No clubbing or cyanosis, trace pitting edema Skin: No skin breaks, tears or lesions Psychiatry: Appropriate, no evidence of psychoses Neurology: No focal deficits  Data Reviewed: Noted BNP of 824  Disposition:  Status is: Inpatient Remains inpatient appropriate because:  -Improvement in hypoxia -Diuresis for CHF -Evaluation by physical therapy -Heart rate control ambulating    Anticipated discharge date: 12/28  Family Communication: Daughter at bedside DVT Prophylaxis: Place TED hose Start: 05/10/22 1906 apixaban (ELIQUIS) tablet 5 mg    Author: Annita Brod ,MD 05/11/2022 2:18  PM  To reach On-call, see care teams to locate the attending and reach out via [email or web address]. Between 7PM-7AM, please contact night-coverage If you still have difficulty reaching the attending provider, please page the [facility] (Director on Call) for [facility] on amion for  assistance.

## 2022-05-11 NOTE — Assessment & Plan Note (Addendum)
Mild, secondary to A-fib.  Have started IV Lasix.

## 2022-05-12 ENCOUNTER — Other Ambulatory Visit: Payer: Self-pay

## 2022-05-12 DIAGNOSIS — I4891 Unspecified atrial fibrillation: Secondary | ICD-10-CM | POA: Diagnosis not present

## 2022-05-12 DIAGNOSIS — I4821 Permanent atrial fibrillation: Secondary | ICD-10-CM

## 2022-05-12 DIAGNOSIS — I5031 Acute diastolic (congestive) heart failure: Secondary | ICD-10-CM | POA: Diagnosis not present

## 2022-05-12 DIAGNOSIS — J9601 Acute respiratory failure with hypoxia: Secondary | ICD-10-CM | POA: Diagnosis not present

## 2022-05-12 DIAGNOSIS — B338 Other specified viral diseases: Secondary | ICD-10-CM

## 2022-05-12 LAB — BASIC METABOLIC PANEL
Anion gap: 9 (ref 5–15)
BUN: 31 mg/dL — ABNORMAL HIGH (ref 8–23)
CO2: 25 mmol/L (ref 22–32)
Calcium: 9.2 mg/dL (ref 8.9–10.3)
Chloride: 107 mmol/L (ref 98–111)
Creatinine, Ser: 0.9 mg/dL (ref 0.44–1.00)
GFR, Estimated: >60 mL/min (ref >60)
Glucose, Bld: 136 mg/dL — ABNORMAL HIGH (ref 70–99)
Potassium: 4.2 mmol/L (ref 3.5–5.1)
Sodium: 141 mmol/L (ref 135–145)

## 2022-05-12 LAB — BRAIN NATRIURETIC PEPTIDE: B Natriuretic Peptide: 746.2 pg/mL — ABNORMAL HIGH (ref 0.0–100.0)

## 2022-05-12 MED ORDER — FUROSEMIDE 10 MG/ML IJ SOLN
40.0000 mg | Freq: Two times a day (BID) | INTRAMUSCULAR | Status: DC
  Administered 2022-05-12 – 2022-05-13 (×2): 40 mg via INTRAVENOUS
  Filled 2022-05-12 (×2): qty 4

## 2022-05-12 MED ORDER — BISOPROLOL FUMARATE 5 MG PO TABS
20.0000 mg | ORAL_TABLET | Freq: Two times a day (BID) | ORAL | Status: DC
  Administered 2022-05-12 – 2022-05-13 (×3): 20 mg via ORAL
  Filled 2022-05-12 (×3): qty 4

## 2022-05-12 MED ORDER — LEVALBUTEROL HCL 0.63 MG/3ML IN NEBU
0.6300 mg | INHALATION_SOLUTION | Freq: Three times a day (TID) | RESPIRATORY_TRACT | Status: DC
  Administered 2022-05-13: 0.63 mg via RESPIRATORY_TRACT
  Filled 2022-05-12 (×2): qty 3

## 2022-05-12 MED ORDER — LEVALBUTEROL HCL 0.63 MG/3ML IN NEBU
0.6300 mg | INHALATION_SOLUTION | Freq: Three times a day (TID) | RESPIRATORY_TRACT | Status: DC
  Administered 2022-05-12 (×2): 0.63 mg via RESPIRATORY_TRACT
  Filled 2022-05-12 (×2): qty 3

## 2022-05-12 MED ORDER — GUAIFENESIN 100 MG/5ML PO LIQD: 10.0000 mL | ORAL | Status: AC | PRN

## 2022-05-12 MED ORDER — BUPROPION HCL ER (XL) 150 MG PO TB24
150.0000 mg | ORAL_TABLET | Freq: Every day | ORAL | Status: DC
  Administered 2022-05-12 – 2022-05-13 (×2): 150 mg via ORAL
  Filled 2022-05-12 (×2): qty 1

## 2022-05-12 NOTE — Progress Notes (Signed)
[facility] Progress Note  Patient: Lauren Lloyd    ZOX:[id number]  DOA: 05/10/2022    Date of Service: the patient was seen and examined on 05/12/2022  Brief hospital course: 81 year old female with past medical history of atrial fibrillation on Eliquis status post pacemaker, COPD and obesity who presented to the emergency room on 12/26 with complaints of shortness of breath and cough x 6 days.  Patient found to be positive for RSV and also found to be in atrial fibrillation with rapid ventricular rate.  Patient brought in for overnight observation.  By 12/27 morning, feeling better with heart rate somewhat stable.  She was ambulated and while oxygen saturations stayed normal, heart rate went back into rapid ventricular rate into the 140s.   Assessment and Plan: * Atrial fibrillation with RVR ([place]) - Presumed secondary to RSV in setting of patient missing bisoprolol home dosing due to her children taking her to the hospital right away. Patient tells me that it has been hard to sometimes keep her heart rate down and she is not always aware when she is in rapid ventricular rate.  Increased Zebeta to 20 mg twice daily (from 10 mg) which has improved her heart rate without hypotension or bradycardia  Acute respiratory failure with hypoxia (HCC)-resolved as of 05/12/2022 Secondary to RSV and CHF.  Intermittent, especially with ambulation.  Continue diuresis and supportive care.  Improved and no episodes of hypoxia since 12/27, even with ambulation  RSV (acute bronchiolitis due to respiratory syncytial virus) With coughing fits - Symptomatic support - Guaifenesin 100 mg/5 mL every 4 hours as needed for cough/loosen phlegm during the day, 2 days ordered - Tussionex nightly as needed for cough ordered -Steroids given significant wheezing, will discharge on taper  Acute diastolic (congestive) heart failure (HCC) Mild, secondary to A-fib.  BNP with little change from previous day.  She  normally takes 40 mg p.o. daily, and is supposed to take an additional 1 as needed, but states she only usually takes 1.  Patient reports moderate urine output with IV 20 twice daily, so increased to 40 twice daily.  Pulmonary hypertension, unspecified ([place]) - CPAP nightly ordered  Insomnia - Melatonin 5 mg nightly as needed for sleep  Hypercholesteremia - Resume home ezetimibe 10 mg daily  Obesity (BMI 30-39.9) Meets criteria for BMI greater than 30  Persistent atrial fibrillation (HCC) - Resumed home bisoprolol 10 mg p.o. twice daily, Eliquis 5 mg p.o. twice daily       Body mass index is 34.46 kg/m.        Consultants: None  Procedures: None   Antimicrobials: None  Code Status: Full code   Subjective: Increased cough and wheezing  Objective: Noted earlier hypoxia Vitals:   05/12/22 1545 05/12/22 1905  BP: 131/76 (!) 122/98  Pulse: 80 78  Resp: 17 18  Temp: 97.8 F (36.6 C) 98 F (36.7 C)  SpO2: 91% 96%   No intake or output data in the 24 hours ending 05/12/22 1930 Filed Weights   05/10/22 1244  Weight: 99.8 kg   Body mass index is 34.46 kg/m.  Exam:  General: Alert and oriented x 3, no acute distress HEENT: Normocephalic, atraumatic, mucous membranes are moist Cardiovascular: Irregular rhythm, currently rate controlled Respiratory: Persistent rhonchi, persistent end expiratory wheeze Abdomen: Soft, nontender, nondistended, positive bowel sounds Musculoskeletal: No clubbing or cyanosis, trace pitting edema Skin: No skin breaks, tears or lesions Psychiatry: Appropriate, no evidence of psychoses Neurology: No focal deficits  Data  Reviewed: Stable renal function.  BNP down to 746  Disposition:  Status is: Inpatient Remains inpatient appropriate because:  -Diuresis for CHF -Evaluation by physical therapy -Control and heart rate -Improvement in wheezing    Anticipated discharge date: 12/29  Family Communication: Updated son by  phone DVT Prophylaxis: Place TED hose Start: 05/10/22 1906 apixaban (ELIQUIS) tablet 5 mg    Author: Annita Brod ,MD 05/12/2022 7:30 PM  To reach On-call, see care teams to locate the attending and reach out via [email or web address]. Between 7PM-7AM, please contact night-coverage If you still have difficulty reaching the attending provider, please page the [facility] (Director on Call) for [facility] on amion for assistance.

## 2022-05-12 NOTE — ED Notes (Signed)
ED TO INPATIENT HANDOFF REPORT  ED Nurse Name and Phone #: [phone] Lauren Lloyd   S Name/Age/Gender Lauren Lloyd 81 y.o. female Room/Bed: ED33A/ED33A  Code Status   Code Status: Full Code  Home/SNF/Other Home Patient oriented to: self, place, time, and situation Is this baseline? Yes   Triage Complete: Triage complete  Chief Complaint Atrial fibrillation with RVR (Lauren [facility]) [I48.91] Acute respiratory failure with hypoxia (Lau[facility]) [J96.01]  Triage Note Pt in with co shortness of breath states has had a cough and shob, unsure of temp was told to come here from urgent care.    Allergies Allergies  Allergen Reactions   Levofloxacin Other (See Comments)    Other reaction(s): Joint Pains  Other Reaction: OTHER REACTION  Other reaction(s): Joint Pains   Influenza Vaccine Recombinant Other (See Comments)   Influenza Vaccines Other (See Comments)    Bell's Palsy   Other Other (See Comments)   Oysters [Shellfish Allergy] Swelling    She states she had eaten them three days in a row and she developed swelling around her eyes.     Level of Care/Admitting Diagnosis ED Disposition     ED Disposition  Admit   Condition  --   [place]: [facility] [[id number]]  Level of Care: Telemetry Medical [104]  Covid Evaluation: Confirmed COVID Negative  Diagnosis: Acute respiratory failure with hypoxia [facility]) [[id number]]  Admitting Physician: Junius Argyle  Attending Physician: Annita Brod [[id number]]  Certification:: I certify this patient will need inpatient services for at least 2 midnights  Estimated Length of Stay: 2          B Medical/Surgery History Past Medical History:  Diagnosis Date   (HFpEF) heart failure with preserved ejection fraction (Lauren Lloyd)    a. 05/2018 Echo: EF 55-60%, no rwma, mild to mod MR. Nl RV fxn. Mod TR. PASP 75mHg.   Arthritis    knees, Hands   Arthritis of knee    Back pain    Carotid arterial disease (Lauren Lloyd     a. 03/2019 Carotid U/S: <50% bilat ICA stenoses.   Cholelithiasis    a. 10/2018 noted on CT.   Edema, lower extremity    Fatty liver    GERD (gastroesophageal reflux disease)    History of stress test    a. 06/2018 MV: EF 59%, no ischemia/infarct. Low risk.   Knee pain    Lactose intolerance    Mitral regurgitation    a. 05/2018 Echo: mild to mod MR.   Multinodular goiter    Obesity    OSA (obstructive sleep apnea)    PAF (paroxysmal atrial fibrillation) (Lauren Lloyd    a.  Diagnosed 12/19; b. 05/2018 s/p DCCV; c. 03/2019 & 05/2019 recurrent AFib-->managed w/ amio load; d. CHADS2VASc = 6 (CHF, HTN, age x 2, vascular disease, female)-->Eliquis & amio 100 qd.   PAH (pulmonary artery hypertension) (Lauren Lloyd)    Scoliosis    SOB (shortness of breath)    Swallowing difficulty    Past Surgical History:  Procedure Laterality Date   CARDIOVERSION N/A 06/15/2018   Procedure: CARDIOVERSION (CATH LAB);  Surgeon: GMinna Merritts MD;  Location: [facility] ORS;  Service: Cardiovascular;  Laterality: N/A;   CARDIOVERSION N/A 02/18/2021   Procedure: CARDIOVERSION;  Surgeon: GMinna Merritts MD;  Location: [facility] ORS;  Service: Cardiovascular;  Laterality: N/A;   CATARACT EXTRACTION W/PHACO Right 01/25/2016   Procedure: CATARACT EXTRACTION PHACO AND INTRAOCULAR LENS PLACEMENT ([place];  Surgeon: ADeirdre PeerVin-Parikh,  MD;  Location: [place];  Service: Ophthalmology;  Laterality: Right;  RIGHT   CATARACT EXTRACTION W/PHACO Left 02/22/2016   Procedure: CATARACT EXTRACTION PHACO AND INTRAOCULAR LENS PLACEMENT (Lauren Lloyd);  Surgeon: Ronnell Freshwater, MD;  Location: [facility];  Service: Ophthalmology;  Laterality: Left;  LEFT   HAMMER TOE SURGERY  05/16/2008   KNEE ARTHROSCOPY Right 05/16/2002   PACEMAKER IMPLANT N/A 10/25/2021   Procedure: PACEMAKER IMPLANT;  Surgeon: Vickie Epley, MD;  Location: [facility];  Service: Cardiovascular;  Laterality: N/A;   REPLACEMENT TOTAL KNEE Right  05/17/2007   [facility]   SKIN GRAFT Left 04/08/2013   Done on left index finger   TONSILLECTOMY  05/16/1944   TOTAL KNEE ARTHROPLASTY Left 12/02/2020     A IV Location/Drains/Wounds Patient Lines/Drains/Airways Status     Active Line/Drains/Airways     Name Placement date Placement time Site Days   Peripheral IV 05/10/22 22 G Anterior;Proximal;Right Forearm 05/10/22  1804  Forearm  2   Epidural Catheter 07/27/21 07/27/21  0842  -- 289   Incision (Closed) 01/25/16 Eye Right 01/25/16  0752  -- 2299   Incision (Closed) 02/22/16 Eye Left 02/22/16  1039  -- 2271   Incision (Closed) 10/25/21 Chest Left;Upper 10/25/21  1900  -- 199            Intake/Output Last 24 hours No intake or output data in the 24 hours ending 05/12/22 1823  Labs/Imaging Results for orders placed or performed during the hospital encounter of 05/10/22 (from the past 48 hour(s))  Phosphorus     Status: None   Collection Time: 05/11/22  4:32 AM  Result Value Ref Range   Phosphorus 3.9 2.5 - 4.6 mg/dL    Comment: Performed at [facility], [place] [address] [place], [place] [address]  Magnesium     Status: None   Collection Time: 05/11/22  4:32 AM  Result Value Ref Range   Magnesium 2.0 1.7 - 2.4 mg/dL    Comment: Performed at [facility], [address] [place], [place] [address]  Basic metabolic panel     Status: Abnormal   Collection Time: 05/11/22  4:32 AM  Result Value Ref Range   Sodium 142 135 - 145 mmol/L   Potassium 3.6 3.5 - 5.1 mmol/L   Chloride 109 98 - 111 mmol/L   CO2 24 22 - 32 mmol/L   Glucose, Bld 202 (H) 70 - 99 mg/dL    Comment: Glucose reference range applies only to samples taken after fasting for at least 8 hours.   BUN 26 (H) 8 - 23 mg/dL   Creatinine, Ser 0.96 0.44 - 1.00 mg/dL   Calcium 9.2 8.9 - 10.3 mg/dL   GFR, Estimated 59 (L) >60 mL/min    Comment: (NOTE) Calculated using the CKD-EPI Creatinine Equation (2021)    Anion gap 9 5  - 15    Comment: Performed at [facility], [address] [place], [place] [address]  CBC     Status: None   Collection Time: 05/11/22  4:32 AM  Result Value Ref Range   WBC 6.2 4.0 - 10.5 K/uL   RBC 4.69 3.87 - 5.11 MIL/uL   Hemoglobin 14.1 12.0 - 15.0 g/dL   HCT 44.1 36.0 - 46.0 %   MCV 94.0 80.0 - 100.0 fL   MCH 30.1 26.0 - 34.0 pg   MCHC 32.0 30.0 - 36.0 g/dL   RDW 13.9 11.5 -  15.5 %   Platelets 191 150 - 400 K/uL   nRBC 0.0 0.0 - 0.2 %    Comment: Performed at [facility], [address] [place], [place] [address]  Basic metabolic panel     Status: Abnormal   Collection Time: 05/12/22  4:53 AM  Result Value Ref Range   Sodium 141 135 - 145 mmol/L   Potassium 4.2 3.5 - 5.1 mmol/L   Chloride 107 98 - 111 mmol/L   CO2 25 22 - 32 mmol/L   Glucose, Bld 136 (H) 70 - 99 mg/dL    Comment: Glucose reference range applies only to samples taken after fasting for at least 8 hours.   BUN 31 (H) 8 - 23 mg/dL   Creatinine, Ser 0.90 0.44 - 1.00 mg/dL   Calcium 9.2 8.9 - 10.3 mg/dL   GFR, Estimated >60 >60 mL/min    Comment: (NOTE) Calculated using the CKD-EPI Creatinine Equation (2021)    Anion gap 9 5 - 15    Comment: Performed at [facility], [address] [place], [place] [address]  Brain natriuretic peptide     Status: Abnormal   Collection Time: 05/12/22  4:53 AM  Result Value Ref Range   B Natriuretic Peptide 746.2 (H) 0.0 - 100.0 pg/mL    Comment: Performed at [facility], [address] [place],  [address]   No results found.  Pending Labs Unresulted Labs (From admission, onward)     Start     Ordered   05/12/22 3532  Basic metabolic panel  Daily,   R      05/11/22 1414            Vitals/Pain Today's Vitals   05/12/22 0918 05/12/22 0926 05/12/22 1202 05/12/22 1545  BP:  (!) 162/106 (!) 157/97 131/76  Pulse: (!) 117 (!) 106 72 80  Resp:  18 (!) 22 17  Temp:   97.8 F (36.6 C) 97.8 F (36.6 C)  TempSrc:    Oral   SpO2: 92% 92% 93% 91%  Weight:      Height:      PainSc:   0-No pain     Isolation Precautions Droplet and Contact precautions  Medications Medications  apixaban (ELIQUIS) tablet 5 mg (5 mg Oral Given 05/12/22 0928)  acetaminophen (TYLENOL) tablet 650 mg (650 mg Oral Given 05/12/22 0928)    Or  acetaminophen (TYLENOL) suppository 650 mg ( Rectal See Alternative 05/12/22 0928)  ondansetron (ZOFRAN) tablet 4 mg (has no administration in time range)    Or  ondansetron (ZOFRAN) injection 4 mg (has no administration in time range)  senna-docusate (Senokot-S) tablet 1 tablet (has no administration in time range)  ezetimibe (ZETIA) tablet 10 mg (10 mg Oral Given 05/12/22 0928)  calcium carbonate (TUMS - dosed in mg elemental calcium) chewable tablet 600 mg of elemental calcium (has no administration in time range)  chlorpheniramine-HYDROcodone (TUSSIONEX) 10-8 MG/5ML suspension 5 mL (has no administration in time range)  melatonin tablet 5 mg (has no administration in time range)  levalbuterol (XOPENEX) nebulizer solution 0.63 mg (0.63 mg Nebulization Given 05/12/22 0927)  predniSONE (DELTASONE) tablet 50 mg (50 mg Oral Given 05/12/22 0928)  buPROPion (WELLBUTRIN XL) 24 hr tablet 150 mg (150 mg Oral Given 05/12/22 0928)  bisoprolol (ZEBETA) tablet 20 mg (20 mg Oral Given 05/12/22 0931)  levalbuterol (XOPENEX) nebulizer solution 0.63 mg (0.63 mg Nebulization Given 05/12/22 1444)  furosemide (LASIX) injection 40 mg (40 mg Intravenous Given 05/12/22  1811)  guaiFENesin (ROBITUSSIN) 100 MG/5ML liquid 10 mL (has no administration in time range)  ipratropium-albuterol (DUONEB) 0.5-2.5 (3) MG/3ML nebulizer solution 3 mL (3 mLs Nebulization Given 05/10/22 1743)  ipratropium-albuterol (DUONEB) 0.5-2.5 (3) MG/3ML nebulizer solution 3 mL (3 mLs Nebulization Given 05/10/22 1743)  ipratropium-albuterol (DUONEB) 0.5-2.5 (3) MG/3ML nebulizer solution 3 mL (3 mLs Nebulization Given 05/10/22 1743)   methylPREDNISolone sodium succinate (SOLU-MEDROL) 125 mg/2 mL injection 125 mg (125 mg Intravenous Given 05/10/22 1744)  benzonatate (TESSALON) capsule 100 mg (100 mg Oral Given 05/10/22 1744)  guaiFENesin (ROBITUSSIN) 100 MG/5ML liquid 5 mL (5 mLs Oral Given 05/10/22 1743)  acetaminophen (TYLENOL) tablet 1,000 mg (1,000 mg Oral Given 05/10/22 1744)  bisoprolol (ZEBETA) tablet 10 mg (10 mg Oral Given 05/10/22 1745)  diltiazem (CARDIZEM) injection 10 mg (10 mg Intravenous Given 05/10/22 1915)  metoprolol tartrate (LOPRESSOR) injection 5 mg (5 mg Intravenous Given 05/11/22 0934)    Mobility walks Low fall risk   Focused Assessments    R Recommendations: See Admitting Provider Note  Report given to:   Additional Notes: please call with any questions

## 2022-05-12 NOTE — Evaluation (Signed)
Physical Therapy Evaluation Patient Details Name: Lauren Lloyd MRN: [id number] DOB: [birth year] Today's Date: 05/12/2022  History of Present Illness  presented to ER secondary to progressive cough, SOB; admitted for management of Afib with RVR, RSV  Clinical Impression  Prior to admission, patient living alone in split-level home; 3 steps to enter, R ascending rail and 6 steps between levels, L ascending rail.  Requires stair negotiation to access essential rooms of home.  At baseline, indep ambulatory without assist device for household and community mobilization; denies fall history (recently completed course of therapy s/p L THR, 12/2021). On evaluation, patient alert and oriented, follows commands and agreeable to participation with session as appropriate.  Denies pain, but endorses persistent coughing spells that worsen SOB (both at rest and with activity).  Bilat UE/LE strength and ROM grossly symmetrical and WFL for basic transfers and gait, except mild post-lateral weakness to L hip noted in closed-chain loading phases of gait cycle.  Able to complete bed mobility with mod indep; sit/stand, basic transfers and gait (75' x3) without assist device, supervision/mod indep.  Demonstrates partially reciprocal stepping pattern, mildly antalgic with mild weakness to L post-lateral hip at times; mild SOB with exertional efforts, sats >90% on RA, HR peak 126 with gait  Would benefit from skilled PT to address above deficits and promote optimal return to PLOF; Recommend transition to [place] upon discharge from acute hospitalization.      Recommendations for follow up therapy are one component of a multi-disciplinary discharge planning process, led by the attending physician.  Recommendations may be updated based on patient status, additional functional criteria and insurance authorization.  Follow Up Recommendations Home health PT      Assistance Recommended at Discharge PRN  Patient can return home  with the following  A little help with walking and/or transfers;A little help with bathing/dressing/bathroom    Equipment Recommendations  (has RW)  Recommendations for Other Services       Functional Status Assessment Patient has had a recent decline in their functional status and demonstrates the ability to make significant improvements in function in a reasonable and predictable amount of time.     Precautions / Restrictions Precautions Precautions: None Restrictions Weight Bearing Restrictions: No      Mobility  Bed Mobility Overal bed mobility: Modified Independent                  Transfers Overall transfer level: Needs assistance Equipment used: None Transfers: Sit to/from Stand Sit to Stand: Supervision, Modified independent (Device/Increase time)                Ambulation/Gait Ambulation/Gait assistance: Supervision, Modified independent (Device/Increase time) Gait Distance (Feet):  (75' x3) Assistive device: None         General Gait Details: partially reciprocal stepping pattern, mildly antalgic with mild weakness to L post-lateral hip at times; mild SOB with exertional efforts, sats >90% on RA, HR peak 126 with gait  Stairs            Wheelchair Mobility    Modified Rankin (Stroke Patients Only)       Balance Overall balance assessment: Needs assistance Sitting-balance support: No upper extremity supported, Feet supported Sitting balance-Leahy Scale: Good     Standing balance support: No upper extremity supported Standing balance-Leahy Scale: Fair                               Pertinent  Vitals/Pain Pain Assessment Pain Assessment: No/denies pain    Home Living Family/patient expects to be discharged to:: Private residence Living Arrangements: Alone Available Help at Discharge: Family;Available PRN/intermittently Type of Home: House Home Access: Stairs to enter Entrance Stairs-Rails: Right Entrance  Stairs-Number of Steps: 3 Alternate Level Stairs-Number of Steps: 6 Home Layout: Multi-level        Prior Function Prior Level of Function : Independent/Modified Independent             Mobility Comments: Indep with ADLs, household and community mobilization; denies fall history.  Recently completed course of therapy after THR (August, 2023)       Hand Dominance        Extremity/Trunk Assessment   Upper Extremity Assessment Upper Extremity Assessment: Overall WFL for tasks assessed (mild closed-chain weakness to L hip, mildly antalgic L LE at times)    Lower Extremity Assessment Lower Extremity Assessment: Overall WFL for tasks assessed       Communication   Communication: No difficulties  Cognition Arousal/Alertness: Awake/alert Behavior During Therapy: WFL for tasks assessed/performed Overall Cognitive Status: Within Functional Limits for tasks assessed                                          General Comments      Exercises Other Exercises Other Exercises: Toilet transfer, ambulatory without assist device, sup/mod indep; sit/stand from standard toilet and standing balance at sink for hand hygiene, sup/mod indep   Assessment/Plan    PT Assessment Patient needs continued PT services  PT Problem List Decreased activity tolerance;Decreased balance;Decreased mobility;Cardiopulmonary status limiting activity;Decreased strength       PT Treatment Interventions DME instruction;Gait training;Stair training;Functional mobility training;Therapeutic activities;Therapeutic exercise;Balance training;Patient/family education    PT Goals (Current goals can be found in the Care Plan section)  Acute Rehab PT Goals Patient Stated Goal: to return home PT Goal Formulation: With patient Time For Goal Achievement: 05/26/22 Potential to Achieve Goals: Good    Frequency Min 2X/week     Co-evaluation               AM-PAC PT "6 Clicks" Mobility   Outcome Measure Help needed turning from your back to your side while in a flat bed without using bedrails?: None Help needed moving from lying on your back to sitting on the side of a flat bed without using bedrails?: None Help needed moving to and from a bed to a chair (including a wheelchair)?: None Help needed standing up from a chair using your arms (e.g., wheelchair or bedside chair)?: None Help needed to walk in hospital room?: None Help needed climbing 3-5 steps with a railing? : A Little 6 Click Score: 23    End of Session   Activity Tolerance: Patient tolerated treatment well Patient left: in bed;with call bell/phone within reach Nurse Communication: Mobility status PT Visit Diagnosis: Muscle weakness (generalized) (M62.81);Difficulty in walking, not elsewhere classified (R26.2)    Time: 6962-9528 PT Time Calculation (min) (ACUTE ONLY): 17 min   Charges:   PT Evaluation $PT Eval Moderate Complexity: 1 Mod          Schyler Butikofer H. Owens Shark, PT, DPT, NCS 05/12/22, 8:35 PM [phone]

## 2022-05-12 NOTE — Telephone Encounter (Signed)
Pt is scheduled for AV Node Ablation on 07/05/22.

## 2022-05-12 NOTE — Progress Notes (Signed)
Pt states she uses CPAP at home but does not wish to use one here. Pt aware that if she changes her mind one will be made available to her.

## 2022-05-12 NOTE — ED Notes (Signed)
Pt up to restroom with no issue.

## 2022-05-13 DIAGNOSIS — I4891 Unspecified atrial fibrillation: Secondary | ICD-10-CM | POA: Diagnosis not present

## 2022-05-13 LAB — BASIC METABOLIC PANEL
Anion gap: 9 (ref 5–15)
BUN: 34 mg/dL — ABNORMAL HIGH (ref 8–23)
CO2: 27 mmol/L (ref 22–32)
Calcium: 8.9 mg/dL (ref 8.9–10.3)
Chloride: 104 mmol/L (ref 98–111)
Creatinine, Ser: 0.95 mg/dL (ref 0.44–1.00)
GFR, Estimated: 60 mL/min — ABNORMAL LOW (ref >60)
Glucose, Bld: 102 mg/dL — ABNORMAL HIGH (ref 70–99)
Potassium: 3.8 mmol/L (ref 3.5–5.1)
Sodium: 140 mmol/L (ref 135–145)

## 2022-05-13 MED ORDER — PREDNISONE 10 MG PO TABS: ORAL_TABLET | ORAL | 0 refills | Status: DC

## 2022-05-13 MED ORDER — BISOPROLOL FUMARATE 10 MG PO TABS: 20.0000 mg | ORAL_TABLET | Freq: Two times a day (BID) | ORAL | 1 refills | Status: DC

## 2022-05-13 MED ORDER — LEVALBUTEROL TARTRATE 45 MCG/ACT IN AERO: 2.0000 | INHALATION_SPRAY | Freq: Three times a day (TID) | RESPIRATORY_TRACT | 2 refills | Status: DC | PRN

## 2022-05-13 MED ORDER — HYDROCOD POLI-CHLORPHE POLI ER 10-8 MG/5ML PO SUER: 5.0000 mL | Freq: Every evening | ORAL | 0 refills | Status: DC | PRN

## 2022-05-13 NOTE — Plan of Care (Signed)
  Problem: Education: Goal: Knowledge of General Education information will improve Description: Including pain rating scale, medication(s)/side effects and non-pharmacologic comfort measures Outcome: Progressing   Problem: Health Behavior/Discharge Planning: Goal: Ability to manage health-related needs will improve Outcome: Progressing   Problem: Clinical Measurements: Goal: Ability to maintain clinical measurements within normal limits will improve Outcome: Progressing Goal: Will remain free from infection Outcome: Progressing   Problem: Education: Goal: Knowledge of General Education information will improve Description: Including pain rating scale, medication(s)/side effects and non-pharmacologic comfort measures Outcome: Progressing   Problem: Health Behavior/Discharge Planning: Goal: Ability to manage health-related needs will improve Outcome: Progressing   Problem: Clinical Measurements: Goal: Ability to maintain clinical measurements within normal limits will improve Outcome: Progressing Goal: Will remain free from infection Outcome: Progressing

## 2022-05-13 NOTE — Discharge Summary (Signed)
Physician Discharge Summary   Patient: Lauren Lloyd MRN: [id number] DOB: [birth year]  Admit date:     05/10/2022  Discharge date: 05/13/22  Discharge Physician: Fritzi Mandes   PCP: [name], MD   Recommendations at discharge:    F/u PCP in 1-2 weeks Keep your appt with Dr Quentin Ore for Ablation F/u Dr Rockey Situ in 1-2 weeks for CHF  Discharge Diagnoses: Principal Problem:   Atrial fibrillation with RVR ([place]) Active Problems:   RSV (acute bronchiolitis due to respiratory syncytial virus)   Acute diastolic (congestive) heart failure ([place])   Pulmonary hypertension, unspecified ([place])   Insomnia   Hypercholesteremia   Obesity (BMI 30-39.9)   Hospital Course: 81 year old female with past medical history of atrial fibrillation on Eliquis status post pacemaker, COPD and obesity who presented to the emergency room on 12/26 with complaints of shortness of breath and cough x 6 days.  Patient found to be positive for RSV and also found to be in atrial fibrillation with rapid ventricular rate.  Patient brought in for overnight observation.  By 12/27 morning, feeling better with heart rate somewhat stable.  She was ambulated and while oxygen saturations stayed normal, heart rate went back into rapid ventricular rate into the 140s.  Assessment and Plan:  Atrial fibrillation with RVR ([place]) - Presumed secondary to RSV in setting of patient missing bisoprolol home dosing due to pt being taking her to the hospital right away. --  Increased Zebeta to 20 mg twice daily (from 10 mg) which has improved her heart rate without hypotension or bradycardia   Acute respiratory failure with hypoxia (HCC)-resolved as of 05/12/2022 Secondary to RSV and CHF.  Intermittent, especially with ambulation.  Continue diuresis and supportive care.  Improved and no episodes of hypoxia since 12/27, even with ambulation --pt is on RA   RSV (acute bronchiolitis due to respiratory syncytial virus) - Symptomatic  support - Tussionex nightly as needed for cough ordered -Steroids given significant wheezing, will discharge on taper --xopenex inhaler at d/c   Acute diastolic (congestive) heart failure (HCC) Mild, secondary to A-fib.  BNP with little change from previous day.  She normally takes 40 mg p.o. daily, and is supposed to take an additional 1 as needed, but states she only usually takes 1.  --BNP trending down --pt advised to cont lasix as above --f/u Dr Rockey Situ   Pulmonary hypertension, unspecified ([place]) - CPAP nightly ordered   Insomnia - Melatonin 5 mg nightly as needed for sleep   Hypercholesteremia - Resume home ezetimibe 10 mg daily   Obesity (BMI 30-39.9) Meets criteria for BMI greater than 30   Persistent atrial fibrillation (HCC) - Resumed home bisoprolol 20 mg p.o. twice daily, Eliquis 5 mg p.o. twice daily --pt to f/u Dr Quentin Ore in Jan for possible Ablation  Overall improving D/c to home with HHPT. Pt agreeable        Disposition: Home health Diet recommendation:  Discharge Diet Orders (From admission, onward)     Start     Ordered   05/13/22 0000  Diet - low sodium heart healthy        05/13/22 1048           Cardiac diet DISCHARGE MEDICATION: Allergies as of 05/13/2022       Reactions   Levofloxacin Other (See Comments)   Other reaction(s): Joint Pains Other Reaction: OTHER REACTION Other reaction(s): Joint Pains   Influenza Vaccine Recombinant Other (See Comments)   Influenza Vaccines Other (See Comments)  Bell's Palsy   Other Other (See Comments)   Oysters [shellfish Allergy] Swelling   She states she had eaten them three days in a row and she developed swelling around her eyes.         Medication List     TAKE these medications    acetaminophen 325 MG tablet Commonly known as: TYLENOL Take 650 mg by mouth every 6 (six) hours as needed for moderate pain.   BIOFREEZE EX Apply 1 application. topically daily as needed (Neck pain).    bisoprolol 10 MG tablet Commonly known as: ZEBETA Take 2 tablets (20 mg total) by mouth 2 (two) times daily. What changed:  medication strength See the new instructions.   buPROPion 150 MG 24 hr tablet Commonly known as: WELLBUTRIN XL TAKE 1 TABLET BY MOUTH DAILY   calcium carbonate 750 MG chewable tablet Commonly known as: TUMS EX Chew 2 tablets by mouth daily as needed for heartburn.   chlorpheniramine-HYDROcodone 10-8 MG/5ML Commonly known as: TUSSIONEX Take 5 mLs by mouth at bedtime as needed for cough.   Cholecalciferol 25 MCG (1000 UT) tablet Take 1,000 Units by mouth daily.   Eliquis 5 MG Tabs tablet Generic drug: apixaban TAKE ONE TABLET TWICE DAILY   ezetimibe 10 MG tablet Commonly known as: Zetia Take 1 tablet (10 mg total) by mouth daily.   furosemide 40 MG tablet Commonly known as: LASIX TAKE 1 TABLET BY MOUTH DAILY. TAKE AN EXTRA TABLET AS NEEDED AFTER LUNCH FOR ABDOMINAL SWELLING, LEG SWELLING OR SHORTNESS OF BREATH   levalbuterol 45 MCG/ACT inhaler Commonly known as: XOPENEX HFA Inhale 2 puffs into the lungs every 8 (eight) hours as needed for wheezing.   metaxalone 800 MG tablet Commonly known as: SKELAXIN Take 800 mg by mouth daily as needed for muscle spasms.   multivitamin capsule Take 1 capsule by mouth daily.   mupirocin ointment 2 % Commonly known as: BACTROBAN Place 1 application  into the nose 2 (two) times daily.   naltrexone 50 MG tablet Commonly known as: DEPADE TAKE 1/2 TABLET BY MOUTH DAILY   potassium chloride 10 MEQ tablet Commonly known as: KLOR-CON TAKE 1 TABLET BY MOUTH DAILY   predniSONE 10 MG tablet Commonly known as: DELTASONE Take 50 mg daily--taper by 10 mg then stop Start taking on: May 14, 2022        Follow-up Information     Bacigalupo, Dionne Bucy, MD. Schedule an appointment as soon as possible for a visit in 1 week(s).   Specialty: Family Medicine Why: hospital appt Contact information: [address] [place] [address] [phone]         Vickie Epley, MD. Go to.   Specialties: Cardiology, Radiology Why: on your appt in January Contact information: [place] [address] [phone]         Minna Merritts, MD. Schedule an appointment as soon as possible for a visit in 1 week(s).   Specialty: Cardiology Why: CHF f/u Contact information: [place] [address] [phone]                 Filed Weights   05/10/22 1244  Weight: 99.8 kg     Condition at discharge: fair  The results of significant diagnostics from this hospitalization (including imaging, microbiology, ancillary and laboratory) are listed below for reference.   Imaging Studies: DG Chest 2 View  Result Date: 05/10/2022 CLINICAL DATA:  Shortness  of breath and cough EXAM: CHEST - 2 VIEW COMPARISON:  10/26/2021 FINDINGS: Dual lead pacemaker as seen previously. Mild cardiomegaly. Aortic atherosclerosis. Bronchial thickening pattern consistent with bronchitis. No consolidation, collapse or effusion. No acute bone finding. IMPRESSION: Bronchitis pattern. No consolidation or collapse. Electronically Signed   By: Nelson Chimes M.D.   On: 05/10/2022 13:20   CUP PACEART REMOTE DEVICE CHECK  Result Date: 04/27/2022 Scheduled remote reviewed. Normal device function.  Brief NSVT EGM's show irregular R-R, AF with rapid rates,  Eliquis, permanent AF, overall controlled rates Next remote 91 days. LA  MM 3D SCREEN BREAST BILATERAL  Result Date: 04/15/2022 CLINICAL DATA:  Screening. EXAM: DIGITAL SCREENING BILATERAL MAMMOGRAM WITH TOMOSYNTHESIS AND CAD TECHNIQUE: Bilateral screening digital craniocaudal and mediolateral oblique mammograms were obtained. Bilateral screening digital breast tomosynthesis was performed. The images were evaluated with computer-aided detection. COMPARISON:  Previous exam(s). ACR Breast Density  Category b: There are scattered areas of fibroglandular density. FINDINGS: There are no findings suspicious for malignancy. IMPRESSION: No mammographic evidence of malignancy. A result letter of this screening mammogram will be mailed directly to the patient. RECOMMENDATION: Screening mammogram in one year. (Code:[id number]-01Y) BI-RADS CATEGORY  1: Negative. Electronically Signed   By: Kristopher Oppenheim M.D.   On: 04/15/2022 09:48    Microbiology: Results for orders placed or performed during the hospital encounter of 05/10/22  Resp panel by RT-PCR (RSV, Flu A&B, Covid) Anterior Nasal Swab     Status: Abnormal   Collection Time: 05/10/22 12:55 PM   Specimen: Anterior Nasal Swab  Result Value Ref Range Status   SARS Coronavirus 2 by RT PCR NEGATIVE NEGATIVE Final    Comment: (NOTE) SARS-CoV-2 target nucleic acids are NOT DETECTED.  The SARS-CoV-2 RNA is generally detectable in upper respiratory specimens during the acute phase of infection. The lowest concentration of SARS-CoV-2 viral copies this assay can detect is 138 copies/mL. A negative result does not preclude SARS-Cov-2 infection and should not be used as the sole basis for treatment or other patient management decisions. A negative result may occur with  improper specimen collection/handling, submission of specimen other than nasopharyngeal swab, presence of viral mutation(s) within the areas targeted by this assay, and inadequate number of viral copies(<138 copies/mL). A negative result must be combined with clinical observations, patient history, and epidemiological information. The expected result is Negative.  Fact Sheet for Patients:  EntrepreneurPulse.com.au  Fact Sheet for Healthcare Providers:  IncredibleEmployment.be  This test is no t yet approved or cleared by the [place] FDA and  has been authorized for detection and/or diagnosis of SARS-CoV-2 by FDA under an Emergency Use  Authorization (EUA). This EUA will remain  in effect (meaning this test can be used) for the duration of the COVID-19 declaration under Section 564(b)(1) of the Act, 21 U.S.C.section 360bbb-3(b)(1), unless the authorization is terminated  or revoked sooner.       Influenza A by PCR NEGATIVE NEGATIVE Final   Influenza B by PCR NEGATIVE NEGATIVE Final    Comment: (NOTE) The Xpert Xpress SARS-CoV-2/FLU/RSV plus assay is intended as an aid in the diagnosis of influenza from Nasopharyngeal swab specimens and should not be used as a sole basis for treatment. Nasal washings and aspirates are unacceptable for Xpert Xpress SARS-CoV-2/FLU/RSV testing.  Fact Sheet for Patients: EntrepreneurPulse.com.au  Fact Sheet for Healthcare Providers: IncredibleEmployment.be  This test is not yet approved or cleared by the [place] FDA and has been authorized for detection and/or diagnosis of SARS-CoV-2 by FDA under  an Emergency Use Authorization (EUA). This EUA will remain in effect (meaning this test can be used) for the duration of the COVID-19 declaration under Section 564(b)(1) of the Act, 21 U.S.C. section 360bbb-3(b)(1), unless the authorization is terminated or revoked.     Resp Syncytial Virus by PCR POSITIVE (A) NEGATIVE Final    Comment: (NOTE) Fact Sheet for Patients: EntrepreneurPulse.com.au  Fact Sheet for Healthcare Providers: IncredibleEmployment.be  This test is not yet approved or cleared by the [place] FDA and has been authorized for detection and/or diagnosis of SARS-CoV-2 by FDA under an Emergency Use Authorization (EUA). This EUA will remain in effect (meaning this test can be used) for the duration of the COVID-19 declaration under Section 564(b)(1) of the Act, 21 U.S.C. section 360bbb-3(b)(1), unless the authorization is terminated or revoked.  Performed at [facility], [address] [place], [place] [address]     Labs: CBC: Recent Labs  Lab 05/10/22 1255 05/11/22 0432  WBC 9.5 6.2  NEUTROABS 6.7  --   HGB 14.3 14.1  HCT 44.3 44.1  MCV 94.1 94.0  PLT 214 992   Basic Metabolic Panel: Recent Labs  Lab 05/10/22 1255 05/11/22 0432 05/12/22 0453 05/13/22 0412  NA 140 142 141 140  K 4.0 3.6 4.2 3.8  CL 106 109 107 104  CO2 '24 24 25 27  '$ GLUCOSE 112* 202* 136* 102*  BUN 23 26* 31* 34*  CREATININE 0.97 0.96 0.90 0.95  CALCIUM 9.3 9.2 9.2 8.9  MG  --  2.0  --   --   PHOS  --  3.9  --   --     Discharge time spent: greater than 30 minutes.  Signed: Fritzi Mandes, MD [facility] 05/13/2022

## 2022-05-13 NOTE — Care Management Important Message (Signed)
Important Message  Patient Details  Name: Lauren Lloyd MRN: [id number] Date of Birth: [birth year]   Medicare Important Message Given:  N/A - LOS <3 / Initial given by admissions     Dannette Barbara 05/13/2022, 9:12 AM

## 2022-05-13 NOTE — Progress Notes (Signed)
Pt discharged. All belongings with pt.

## 2022-05-13 NOTE — TOC Progression Note (Signed)
Transition of Care [facility]) - Progression Note    Patient Details  Name: Lauren Lloyd MRN: [id number] Date of Birth: [birth year]  Transition of Care [facility]) CM/SW Contact  Gerilyn Pilgrim, LCSW Phone Number: 05/13/2022, 9:58 AM  Clinical Narrative:  [facility] accepted for  [facility] PT. Orders are in.          Expected Discharge Plan and Services                                               Social Determinants of Health (SDOH) Interventions SDOH Screenings   Food Insecurity: No Food Insecurity (02/03/2020)  Housing: Low Risk  (02/03/2020)  Transportation Needs: No Transportation Needs (04/03/2020)  Alcohol Screen: Low Risk  (03/17/2022)  Depression (PHQ2-9): Low Risk  (03/17/2022)  Recent Concern: Depression (PHQ2-9) - Medium Risk (02/22/2022)  Financial Resource Strain: Low Risk  (08/13/2021)  Physical Activity: Insufficiently Active (02/03/2020)  Social Connections: Moderately Integrated (02/03/2020)  Stress: No Stress Concern Present (02/03/2020)  Tobacco Use: Medium Risk (04/14/2022)    Readmission Risk Interventions     No data to display

## 2022-05-17 ENCOUNTER — Telehealth: Payer: Self-pay | Admitting: *Deleted

## 2022-05-17 ENCOUNTER — Telehealth: Payer: Self-pay

## 2022-05-17 NOTE — Telephone Encounter (Signed)
Copied from [place] [phone]. Topic: General - Inquiry >> May 17, 2022 12:47 PM Marcellus Scott wrote: Reason for CRM: Pt requesting to speak with pharmacist Alex in regards to her Eliquis. Pt stated she has left him several direct messages but has not received a call back.  While pt was on hold the call dropped called pt back and was sent straight to voicemail.   Please advise.

## 2022-05-17 NOTE — Patient Outreach (Signed)
  Care Coordination [facility] Note Transition Care Management Follow-up Telephone Call Date of discharge and from where: [facility] [phone] How have you been since you were released from the hospital? I still have a horrible cough. I have not been able to get my cough medication because the pharmacy was out. They are suppose to have it today. Any questions or concerns? No  Items Reviewed: Did the pt receive and understand the discharge instructions provided? Yes  Medications obtained and verified? Yes  Other? No  Any new allergies since your discharge? No  Dietary orders reviewed? No Do you have support at home? Yes   Home Care and Equipment/Supplies: Were home health services ordered? Yes PT If so, what is the name of the agency? [facility]  Has the agency set up a time to come to the patient's home? no Were any new equipment or medical supplies ordered?  No What is the name of the medical supply agency? n Were you able to get the supplies/equipment? no Do you have any questions related to the use of the equipment or supplies? No  Functional Questionnaire: (I = Independent and D = Dependent) ADLs: I  Bathing/Dressing- I  Meal Prep- I  Eating- I  Maintaining continence- I  Transferring/Ambulation- I  Managing Meds- I  Follow up appointments reviewed:  PCP Hospital f/u appt confirmed? No  Patient wants to schedule herself due to multiple appointments. [facility] f/u appt confirmed? Yes Lars Mage [phone] am [phone] Traci Turner 10:20. Are transportation arrangements needed? No  If their condition worsens, is the pt aware to call PCP or go to the Emergency Dept.? Yes Was the patient provided with contact information for the PCP's office or ED? Yes Was to pt encouraged to call back with questions or concerns? Yes  SDOH assessments and interventions completed:   Yes SDOH Interventions Today    Flowsheet Row Most Recent Value  SDOH Interventions   Food Insecurity  Interventions Intervention Not Indicated  Transportation Interventions Intervention Not Indicated       Care Coordination Interventions:  RN discussed healthy eating to boost immune system    Encounter Outcome:  Pt. Visit Completed    [place] [phone]

## 2022-05-18 ENCOUNTER — Ambulatory Visit: Payer: Medicare HMO | Admitting: Physician Assistant

## 2022-05-18 NOTE — Telephone Encounter (Signed)
Patient requested to un-enroll from CCM services in July. Can you please place a new referral for her?   Thanks Junius Argyle, PharmD, [place], CPP  Clinical Pharmacist Practitioner  [facility] [phone]

## 2022-05-20 ENCOUNTER — Ambulatory Visit (INDEPENDENT_AMBULATORY_CARE_PROVIDER_SITE_OTHER): Payer: Medicare HMO | Admitting: Family Medicine

## 2022-05-20 ENCOUNTER — Encounter: Payer: Self-pay | Admitting: Family Medicine

## 2022-05-20 VITALS — BP 143/79 | HR 86 | Temp 98.2°F | Resp 16 | Wt 219.0 lb

## 2022-05-20 DIAGNOSIS — I5032 Chronic diastolic (congestive) heart failure: Secondary | ICD-10-CM

## 2022-05-20 DIAGNOSIS — I4819 Other persistent atrial fibrillation: Secondary | ICD-10-CM | POA: Diagnosis not present

## 2022-05-20 DIAGNOSIS — J210 Acute bronchiolitis due to respiratory syncytial virus: Secondary | ICD-10-CM

## 2022-05-20 MED ORDER — GUAIFENESIN-CODEINE 100-10 MG/5ML PO SOLN: 5.0000 mL | Freq: Three times a day (TID) | ORAL | 0 refills | Status: DC | PRN

## 2022-05-20 MED ORDER — BENZONATATE 100 MG PO CAPS: 100.0000 mg | ORAL_CAPSULE | Freq: Two times a day (BID) | ORAL | 0 refills | Status: DC | PRN

## 2022-05-20 NOTE — Assessment & Plan Note (Signed)
Respiratory status improved S/p prednisone taper Still coughing Will Rx tessalon and codeine cough syrup Discussed symptomatic treatment as well Advise getting RSV vaccine next year

## 2022-05-20 NOTE — Assessment & Plan Note (Signed)
Feeling more sluggish on increase bisoprolol dose No changes to medications today Continue Eliquis Upcoming cardiology follow-up Rate is back to normal

## 2022-05-20 NOTE — Assessment & Plan Note (Signed)
Followed by cardiology with upcoming appointments Euvolemic today No changes to medications

## 2022-05-20 NOTE — Progress Notes (Signed)
I,Sulibeya S Dimas,acting as a Education administrator for Lavon Paganini, MD.,have documented all relevant documentation on the behalf of Lavon Paganini, MD,as directed by  Lavon Paganini, MD while in the presence of Lavon Paganini, MD.     Established patient visit   Patient: Lauren Lloyd   DOB: 10/23/1940   82 y.o. Female  MRN: [id number] Visit Date: 05/20/2022  Today's healthcare provider: Lavon Paganini, MD   Chief Complaint  Patient presents with   Hospitalization Follow-up   Subjective    HPI  Follow up Hospitalization  Patient was admitted to [facility] on 05/10/22 and discharged on 05/13/22. She was treated for RSV. Treatment for this included steroid taper. Telephone follow up was done on 05/17/22 She reports excellent compliance with treatment. She reports this condition is worsened.  Breathing is better, but cough continues. Not able to get Tussionex.   ----------------------------------------------------------------------------------------   Medications: Outpatient Medications Prior to Visit  Medication Sig   acetaminophen (TYLENOL) 325 MG tablet Take 650 mg by mouth every 6 (six) hours as needed for moderate pain.   bisoprolol (ZEBETA) 10 MG tablet Take 2 tablets (20 mg total) by mouth 2 (two) times daily.   buPROPion (WELLBUTRIN XL) 150 MG 24 hr tablet TAKE 1 TABLET BY MOUTH DAILY   calcium carbonate (TUMS EX) 750 MG chewable tablet Chew 2 tablets by mouth daily as needed for heartburn.   Cholecalciferol 25 MCG (1000 UT) tablet Take 1,000 Units by mouth daily.   ELIQUIS 5 MG TABS tablet TAKE ONE TABLET TWICE DAILY   ezetimibe (ZETIA) 10 MG tablet Take 1 tablet (10 mg total) by mouth daily.   furosemide (LASIX) 40 MG tablet TAKE 1 TABLET BY MOUTH DAILY. TAKE AN EXTRA TABLET AS NEEDED AFTER LUNCH FOR ABDOMINAL SWELLING, LEG SWELLING OR SHORTNESS OF BREATH   levalbuterol (XOPENEX HFA) 45 MCG/ACT inhaler Inhale 2 puffs into the lungs every 8 (eight) hours as needed for  wheezing.   Menthol, Topical Analgesic, (BIOFREEZE EX) Apply 1 application. topically daily as needed (Neck pain).   metaxalone (SKELAXIN) 800 MG tablet Take 800 mg by mouth daily as needed for muscle spasms.   Multiple Vitamin (MULTIVITAMIN) capsule Take 1 capsule by mouth daily.   mupirocin ointment (BACTROBAN) 2 % Place 1 application  into the nose 2 (two) times daily.   naltrexone (DEPADE) 50 MG tablet TAKE 1/2 TABLET BY MOUTH DAILY   potassium chloride (KLOR-CON) 10 MEQ tablet TAKE 1 TABLET BY MOUTH DAILY   [DISCONTINUED] predniSONE (DELTASONE) 10 MG tablet Take 50 mg daily--taper by 10 mg then stop   [DISCONTINUED] chlorpheniramine-HYDROcodone (TUSSIONEX) 10-8 MG/5ML Take 5 mLs by mouth at bedtime as needed for cough. (Patient not taking: Reported on 05/20/2022)   No facility-administered medications prior to visit.    Review of Systems  Respiratory:  Positive for cough, shortness of breath and wheezing.        Objective    BP (!) 143/79 (BP Location: Left Arm, Patient Position: Sitting, Cuff Size: Large)   Pulse 86   Temp 98.2 F (36.8 C) (Oral)   Resp 16   Wt 219 lb (99.3 kg)   SpO2 (!) 89%   BMI 34.30 kg/m  BP Readings from Last 3 Encounters:  05/20/22 (!) 143/79  05/13/22 131/86  03/17/22 130/81   Wt Readings from Last 3 Encounters:  05/20/22 219 lb (99.3 kg)  05/10/22 220 lb (99.8 kg)  03/17/22 226 lb (102.5 kg)      Physical Exam Vitals reviewed.  Constitutional:      General: She is not in acute distress.    Appearance: Normal appearance. She is well-developed. She is not diaphoretic.  HENT:     Head: Normocephalic and atraumatic.  Eyes:     General: No scleral icterus.    Conjunctiva/sclera: Conjunctivae normal.  Neck:     Thyroid: No thyromegaly.  Cardiovascular:     Rate and Rhythm: Normal rate. Rhythm irregular.  Pulmonary:     Effort: Pulmonary effort is normal. No respiratory distress.     Breath sounds: Normal breath sounds. No wheezing,  rhonchi or rales.  Musculoskeletal:     Cervical back: Neck supple.     Right lower leg: No edema.     Left lower leg: No edema.  Lymphadenopathy:     Cervical: No cervical adenopathy.  Skin:    General: Skin is warm and dry.     Findings: No rash.  Neurological:     Mental Status: She is alert and oriented to person, place, and time. Mental status is at baseline.  Psychiatric:        Mood and Affect: Mood normal.       No results found for any visits on 05/20/22.  Assessment & Plan     Problem List Items Addressed This Visit       Cardiovascular and Mediastinum   Chronic diastolic CHF (congestive heart failure) ([place]) - Primary    Followed by cardiology with upcoming appointments Euvolemic today No changes to medications      Persistent atrial fibrillation (HCC)    Feeling more sluggish on increase bisoprolol dose No changes to medications today Continue Eliquis Upcoming cardiology follow-up Rate is back to normal        Respiratory   RSV (acute bronchiolitis due to respiratory syncytial virus)    Respiratory status improved S/p prednisone taper Still coughing Will Rx tessalon and codeine cough syrup Discussed symptomatic treatment as well Advise getting RSV vaccine next year        Return if symptoms worsen or fail to improve.      I, Lavon Paganini, MD, have reviewed all documentation for this visit. The documentation on 05/20/22 for the exam, diagnosis, procedures, and orders are all accurate and complete.   Jaqualin Serpa, Dionne Bucy, MD, MPH [facility]

## 2022-05-24 NOTE — Progress Notes (Signed)
Remote pacemaker transmission.   

## 2022-05-25 ENCOUNTER — Other Ambulatory Visit
Admission: RE | Admit: 2022-05-25 | Discharge: 2022-05-25 | Disposition: A | Discharge status: Home / Self Care | Payer: Medicare HMO | Source: Ambulatory Visit | Attending: Cardiology | Admitting: Cardiology

## 2022-05-25 ENCOUNTER — Ambulatory Visit: Payer: Medicare HMO | Attending: Cardiology | Admitting: Cardiology

## 2022-05-25 VITALS — BP 112/60 | HR 95 | Ht 67.0 in | Wt 217.0 lb

## 2022-05-25 DIAGNOSIS — I495 Sick sinus syndrome: Secondary | ICD-10-CM

## 2022-05-25 DIAGNOSIS — I4821 Permanent atrial fibrillation: Secondary | ICD-10-CM | POA: Diagnosis not present

## 2022-05-25 LAB — CBC
HCT: 47.8 % — ABNORMAL HIGH (ref 36.0–46.0)
Hemoglobin: 15.2 g/dL — ABNORMAL HIGH (ref 12.0–15.0)
MCH: 29.6 pg (ref 26.0–34.0)
MCHC: 31.8 g/dL (ref 30.0–36.0)
MCV: 93 fL (ref 80.0–100.0)
Platelets: 241 10*3/uL (ref 150–400)
RBC: 5.14 MIL/uL — ABNORMAL HIGH (ref 3.87–5.11)
RDW: 13.7 % (ref 11.5–15.5)
WBC: 8.6 10*3/uL (ref 4.0–10.5)
nRBC: 0 % (ref 0.0–0.2)

## 2022-05-25 LAB — CUP PACEART INCLINIC DEVICE CHECK
Date Time Interrogation Session: 20240110140800
Implantable Lead Connection Status: 753985
Implantable Lead Connection Status: 753985
Implantable Lead Implant Date: 20230612
Implantable Lead Implant Date: 20230612
Implantable Lead Location: 753859
Implantable Lead Location: 753860
Implantable Lead Model: 7841
Implantable Lead Model: 7842
Implantable Lead Serial Number: 1182863
Implantable Lead Serial Number: 1274488
Implantable Pulse Generator Implant Date: 20230612
Lead Channel Impedance Value: 545 Ohm
Lead Channel Impedance Value: 692 Ohm
Lead Channel Pacing Threshold Amplitude: 0.5 V
Lead Channel Pacing Threshold Amplitude: 0.6 V
Lead Channel Pacing Threshold Pulse Width: 0.4 ms
Lead Channel Pacing Threshold Pulse Width: 0.4 ms
Lead Channel Sensing Intrinsic Amplitude: 1.1 mV
Lead Channel Sensing Intrinsic Amplitude: 25 mV
Lead Channel Setting Pacing Amplitude: 2.5 V
Lead Channel Setting Pacing Amplitude: 3.5 V
Lead Channel Setting Pacing Pulse Width: 0.4 ms
Lead Channel Setting Sensing Sensitivity: 2.5 mV
Pulse Gen Serial Number: 111954
Zone Setting Status: 755011

## 2022-05-25 LAB — PACEMAKER DEVICE OBSERVATION

## 2022-05-25 LAB — BASIC METABOLIC PANEL
Anion gap: 9 (ref 5–15)
BUN: 27 mg/dL — ABNORMAL HIGH (ref 8–23)
CO2: 26 mmol/L (ref 22–32)
Calcium: 9 mg/dL (ref 8.9–10.3)
Chloride: 103 mmol/L (ref 98–111)
Creatinine, Ser: 1.23 mg/dL — ABNORMAL HIGH (ref 0.44–1.00)
GFR, Estimated: 44 mL/min — ABNORMAL LOW (ref >60)
Glucose, Bld: 90 mg/dL (ref 70–99)
Potassium: 4 mmol/L (ref 3.5–5.1)
Sodium: 138 mmol/L (ref 135–145)

## 2022-05-25 NOTE — Progress Notes (Signed)
Electrophysiology Office Follow up Visit Note:    Date:  05/25/2022   ID:  Lauren Lloyd, DOB 1940/06/14, MRN [id number]  PCP:  [name], MD  [facility] Cardiologist:  Ida Rogue, MD  [facility] Electrophysiologist:  Vickie Epley, MD    Interval History:    Lauren Lloyd is a 82 y.o. female who presents for a follow up visit. They were last seen in clinic January 26, 2022.  She has permanent atrial fibrillation on Eliquis.  We discussed AV nodal ablation at our last appointment.  She thought about it with her family and has decided to proceed.  This is scheduled for July 05, 2022.  She is still recovering after her recent hospitalization for RSV pneumonia.  They increased her bisoprolol.  She is not tolerating the increased dose of bisoprolol well with increased fatigue.  She is very eager to have the AV nodal ablation to be out of reduce/discontinue her beta-blocker.  We discussed that she would need to continue her anticoagulant indefinitely for stroke risk mitigation.    Past Medical History:  Diagnosis Date   (HFpEF) heart failure with preserved ejection fraction ([place])    a. 05/2018 Echo: EF 55-60%, no rwma, mild to mod MR. Nl RV fxn. Mod TR. PASP 76mHg.   Arthritis    knees, Hands   Arthritis of knee    Back pain    Carotid arterial disease ([place]    a. 03/2019 Carotid U/S: <50% bilat ICA stenoses.   Cholelithiasis    a. 10/2018 noted on CT.   Edema, lower extremity    Fatty liver    GERD (gastroesophageal reflux disease)    History of stress test    a. 06/2018 MV: EF 59%, no ischemia/infarct. Low risk.   Knee pain    Lactose intolerance    Mitral regurgitation    a. 05/2018 Echo: mild to mod MR.   Multinodular goiter    Obesity    OSA (obstructive sleep apnea)    PAF (paroxysmal atrial fibrillation) ([place]    a.  Diagnosed 12/19; b. 05/2018 s/p DCCV; c. 03/2019 & 05/2019 recurrent AFib-->managed w/ amio load; d. CHADS2VASc = 6 (CHF, HTN, age  x 2, vascular disease, female)-->Eliquis & amio 100 qd.   PAH (pulmonary artery hypertension) (HCC)    Scoliosis    SOB (shortness of breath)    Swallowing difficulty     Past Surgical History:  Procedure Laterality Date   CARDIOVERSION N/A 06/15/2018   Procedure: CARDIOVERSION (CATH LAB);  Surgeon: GMinna Merritts MD;  Location: [facility] ORS;  Service: Cardiovascular;  Laterality: N/A;   CARDIOVERSION N/A 02/18/2021   Procedure: CARDIOVERSION;  Surgeon: GMinna Merritts MD;  Location: [facility] ORS;  Service: Cardiovascular;  Laterality: N/A;   CATARACT EXTRACTION W/PHACO Right 01/25/2016   Procedure: CATARACT EXTRACTION PHACO AND INTRAOCULAR LENS PLACEMENT ([place];  Surgeon: ARonnell Freshwater MD;  [place]  Service: Ophthalmology;  Laterality: Right;  RIGHT   CATARACT EXTRACTION W/PHACO Left 02/22/2016   Procedure: CATARACT EXTRACTION PHACO AND INTRAOCULAR LENS PLACEMENT ([place];  Surgeon: ARonnell Freshwater MD;  [place]  Service: Ophthalmology;  Laterality: Left;  LEFT   HAMMER TOE SURGERY  05/16/2008   KNEE ARTHROSCOPY Right 05/16/2002   PACEMAKER IMPLANT N/A 10/25/2021   Procedure: PACEMAKER IMPLANT;  Surgeon: LVickie Epley MD;  [facility];  Service: Cardiovascular;  Laterality: N/A;   REPLACEMENT TOTAL KNEE Right 05/17/2007  [facility]   SKIN GRAFT Left 04/08/2013   Done on left index finger   TONSILLECTOMY  05/16/1944   TOTAL KNEE ARTHROPLASTY Left 12/02/2020    Current Medications: Current Meds  Medication Sig   acetaminophen (TYLENOL) 325 MG tablet Take 650 mg by mouth every 6 (six) hours as needed for moderate pain.   benzonatate (TESSALON) 100 MG capsule Take 1 capsule (100 mg total) by mouth 2 (two) times daily as needed for cough.   bisoprolol (ZEBETA) 10 MG tablet Take 2 tablets (20 mg total) by mouth 2 (two) times daily.   buPROPion (WELLBUTRIN XL) 150 MG 24 hr tablet TAKE  1 TABLET BY MOUTH DAILY   calcium carbonate (TUMS EX) 750 MG chewable tablet Chew 2 tablets by mouth daily as needed for heartburn.   Cholecalciferol 25 MCG (1000 UT) tablet Take 1,000 Units by mouth daily.   ELIQUIS 5 MG TABS tablet TAKE ONE TABLET TWICE DAILY   ezetimibe (ZETIA) 10 MG tablet Take 1 tablet (10 mg total) by mouth daily.   furosemide (LASIX) 40 MG tablet TAKE 1 TABLET BY MOUTH DAILY. TAKE AN EXTRA TABLET AS NEEDED AFTER LUNCH FOR ABDOMINAL SWELLING, LEG SWELLING OR SHORTNESS OF BREATH   guaiFENesin-codeine 100-10 MG/5ML syrup Take 5 mLs by mouth 3 (three) times daily as needed for cough.   levalbuterol (XOPENEX HFA) 45 MCG/ACT inhaler Inhale 2 puffs into the lungs every 8 (eight) hours as needed for wheezing.   Menthol, Topical Analgesic, (BIOFREEZE EX) Apply 1 application. topically daily as needed (Neck pain).   metaxalone (SKELAXIN) 800 MG tablet Take 800 mg by mouth daily as needed for muscle spasms.   Multiple Vitamin (MULTIVITAMIN) capsule Take 1 capsule by mouth daily.   mupirocin ointment (BACTROBAN) 2 % Place 1 application  into the nose 2 (two) times daily.   naltrexone (DEPADE) 50 MG tablet TAKE 1/2 TABLET BY MOUTH DAILY   potassium chloride (KLOR-CON) 10 MEQ tablet TAKE 1 TABLET BY MOUTH DAILY     Allergies:   Levofloxacin, Influenza vaccine recombinant, Influenza vaccines, Other, and Oysters [shellfish allergy]   Social History   Socioeconomic History   Marital status: Single    Spouse name: Not on file   Number of children: 5   Years of education: college   Highest education level: Bachelor's degree (e.g., BA, AB, BS)  Occupational History   Occupation: Retail banker: [employer]  Tobacco Use   Smoking status: Former    Packs/day: 1.00    Years: 30.00    Total pack years: 30.00    Types: Cigarettes    Quit date: 05/16/1989    Years since quitting: 33.0   Smokeless tobacco: Never  Vaping Use   Vaping Use: Never used   Substance and Sexual Activity   Alcohol use: Yes    Alcohol/week: 7.0 standard drinks of alcohol    Types: 7 Glasses of wine per week    Comment: 0-2 a night   Drug use: No   Sexual activity: Not Currently  Other Topics Concern   Not on file  Social History Narrative   Pt has a child who passed away at age 83   Social Determinants of Health   Financial Resource Strain: Low Risk  (08/13/2021)   Overall Financial Resource Strain (CARDIA)    Difficulty of Paying Living Expenses: Not hard at all  Food Insecurity: No Food Insecurity (05/17/2022)   Hunger Vital Sign  Worried About Charity fundraiser in the Last Year: Never true    [place] in the Last Year: Never true  Transportation Needs: No Transportation Needs (05/17/2022)   PRAPARE - Hydrologist (Medical): No    Lack of Transportation (Non-Medical): No  Physical Activity: Insufficiently Active (02/03/2020)   Exercise Vital Sign    Days of Exercise per Week: 2 days    Minutes of Exercise per Session: 40 min  Stress: No Stress Concern Present (02/03/2020)   [place]    Feeling of Stress : Not at all  Social Connections: Moderately Integrated (02/03/2020)   Social Connection and Isolation Panel [NHANES]    Frequency of Communication with Friends and Family: More than three times a week    Frequency of Social Gatherings with Friends and Family: More than three times a week    Attends Religious Services: More than 4 times per year    Active Member of [employer] or Organizations: Yes    Attends Music therapist: More than 4 times per year    Marital Status: Divorced     Family History: [name] family history includes Atrial fibrillation in her sister and sister; Breast cancer (age of onset: 24) in her sister; Healthy in her brother; Heart attack in her father; Heart disease in her mother; High blood pressure in her  father; Hyperlipidemia in her sister and sister; Stroke in her father; Transient ischemic attack in her mother.  ROS:   Please see the history of present illness.    All other systems reviewed and are negative.  EKGs/Labs/Other Studies Reviewed:    The following studies were reviewed today:  May 25, 2022 in clinic device interrogation personally reviewed Battery longevity 9 years Lead parameters stable Elevated ventricular rate 100% A-fib burden     Recent Labs: 03/17/2022: ALT 22 05/11/2022: Hemoglobin 14.1; Magnesium 2.0; Platelets 191 05/12/2022: B Natriuretic Peptide 746.2 05/13/2022: BUN 34; Creatinine, Ser 0.95; Potassium 3.8; Sodium 140  Recent Lipid Panel    Component Value Date/Time   CHOL 162 03/17/2022 0909   TRIG 94 03/17/2022 0909   HDL 52 03/17/2022 0909   CHOLHDL 3.1 03/17/2022 0909   CHOLHDL 3.6 05/31/2018 0452   VLDL 21 05/31/2018 0452   LDLCALC 93 03/17/2022 0909    Physical Exam:    VS:  BP 112/60   Pulse 95   Ht '5\' 7"'$  (1.702 m)   Wt 217 lb (98.4 kg)   BMI 33.99 kg/m     Wt Readings from Last 3 Encounters:  05/25/22 217 lb (98.4 kg)  05/20/22 219 lb (99.3 kg)  05/10/22 220 lb (99.8 kg)     GEN:  Well nourished, well developed in no acute distress HEENT: Normal NECK: No JVD LYMPHATICS: No lymphadenopathy CARDIAC: Irregularly irregular, no murmurs, rubs, gallops.  Pacemaker pocket well-healed RESPIRATORY:  Clear to auscultation without rales, wheezing or rhonchi  ABDOMEN: Soft, non-tender, non-distended MUSCULOSKELETAL:  No edema; No deformity  SKIN: Warm and dry NEUROLOGIC:  Alert and oriented x 3 PSYCHIATRIC:  Normal affect        ASSESSMENT:    1. Permanent atrial fibrillation ([place])   2. Tachycardia-bradycardia syndrome ([place])    PLAN:    In order of problems listed above:  #Permanent atrial fibrillation #Tachybradycardia syndrome #Pacemaker in situ The patient has had very difficult to control atrial fibrillation  rates given her diagnosis of tachybradycardia syndrome.  She has a permanent pacemaker in situ that is functioning well.  Planning for AV nodal ablation on July 05, 2022.  Procedure reviewed today including the risks and recovery and she wishes to proceed.  Therapeutic strategies for AF including medicine and ablation were discussed in detail with the patient today. Risk, benefits, and alternatives to AV nodal ablation were also discussed in detail today. These risks include but are not limited to stroke, bleeding, vascular damage, tamponade, perforation, damage to the heart and other structures, worsening renal function, and death. The patient understands these risk and wishes to proceed.          Medication Adjustments/Labs and Tests Ordered: Current medicines are reviewed at length with the patient today.  Concerns regarding medicines are outlined above.  No orders of the defined types were placed in this encounter.  No orders of the defined types were placed in this encounter.    Signed, Lars Mage, MD, [facility], [facility] 05/25/2022 9:23 AM    Electrophysiology [facility]

## 2022-05-25 NOTE — Patient Instructions (Addendum)
Medication Instructions:  Your physician recommends that you continue on your current medications as directed. Please refer to the Current Medication list given to you today.  *If you need a refill on your cardiac medications before your next appointment, please call your pharmacy*   Lab Work: Labs today at the medical mall for scheduled ablation with Dr Quentin Ore. If you have labs (blood work) drawn today and your tests are completely normal, you will receive your results only by: [place] (if you have MyChart) OR A paper copy in the mail If you have any lab test that is abnormal or we need to change your treatment, we will call you to review the results.   Testing/Procedures:  Copy of Instruction Letter provided Your physician has recommended that you have an ablation. Catheter ablation is a medical procedure used to treat some cardiac arrhythmias (irregular heartbeats). During catheter ablation, a long, thin, flexible tube is put into a blood vessel in your groin (upper thigh), or neck. This tube is called an ablation catheter. It is then guided to your heart through the blood vessel. Radio frequency waves destroy small areas of heart tissue where abnormal heartbeats may cause an arrhythmia to start. Please see the instruction sheet given to you today.    Follow-Up: At [facility], you and your health needs are our priority.  As part of our continuing mission to provide you with exceptional heart care, we have created designated Provider Care Teams.  These Care Teams include your primary Cardiologist (physician) and Advanced Practice Providers (APPs -  Physician Assistants and Nurse Practitioners) who all work together to provide you with the care you need, when you need it.  We recommend signing up for the patient portal called "MyChart".  Sign up information is provided on this After Visit Summary.  MyChart is used to connect with patients for Virtual Visits (Telemedicine).   Patients are able to view lab/test results, encounter notes, upcoming appointments, etc.  Non-urgent messages can be sent to your provider as well.   To learn more about what you can do with MyChart, go to [email or web address].    Your next appointment:   Follow up as scheduled   Important Information About Sugar

## 2022-05-26 ENCOUNTER — Ambulatory Visit: Payer: Medicare HMO | Admitting: Family Medicine

## 2022-05-27 ENCOUNTER — Ambulatory Visit: Payer: Medicare HMO | Attending: Cardiology | Admitting: Cardiology

## 2022-05-27 ENCOUNTER — Encounter: Payer: Self-pay | Admitting: Cardiology

## 2022-05-27 VITALS — BP 138/80 | HR 52 | Ht 67.0 in | Wt 219.8 lb

## 2022-05-27 DIAGNOSIS — G4733 Obstructive sleep apnea (adult) (pediatric): Secondary | ICD-10-CM | POA: Diagnosis not present

## 2022-05-27 DIAGNOSIS — I10 Essential (primary) hypertension: Secondary | ICD-10-CM | POA: Diagnosis not present

## 2022-05-27 NOTE — Progress Notes (Signed)
Sleep Medicine CONSULT Note    Date:  05/27/2022   ID:  Lauren Lloyd, DOB 1940-09-25, MRN [id number]  PCP:  [name], MD  Cardiologist: Ida Rogue, MD   Chief Complaint  Patient presents with   New Patient (Initial Visit)    OSA    History of Present Illness:  Lauren Lloyd is a 82 y.o. female who is being seen today for the evaluation of OSA at the request of Ida Rogue, MD.  This is AN 82 year old female with a history of HFpEF, carotid artery disease, GERD, paroxysmal atrial fibrillation and pulmonary hypertension.  She was diagnosed with obstructive sleep apnea in 2020 after performing an NPSG which showed mild obstructive sleep apnea with an AHI of 11/h with moderate snoring and nocturnal hypoxemia.  She underwent CPAP titration and was started on CPAP at 16 cm H2O and ultimately was placed on auto CPAP from 4 to 20 cm H2O. She never was seen by sleep medicine.  She is now referred to sleep medicine for consultation to establish sleep care.  She is doing well with her PAP device and thinks that she has gotten used to it.  She tolerates the full face mask and feels the pressure is adequate.   She denies any significant mouth or nasal dryness or nasal congestion.  She does not think that he snores.  She recently had RSV and could not use the device during her illness.  Past Medical History:  Diagnosis Date   (HFpEF) heart failure with preserved ejection fraction ([facility])    a. 05/2018 Echo: EF 55-60%, no rwma, mild to mod MR. Nl RV fxn. Mod TR. PASP 21mHg.   Arthritis    knees, Hands   Arthritis of knee    Back pain    Carotid arterial disease ([place]    a. 03/2019 Carotid U/S: <50% bilat ICA stenoses.   Cholelithiasis    a. 10/2018 noted on CT.   Edema, lower extremity    Fatty liver    GERD (gastroesophageal reflux disease)    History of stress test    a. 06/2018 MV: EF 59%, no ischemia/infarct. Low risk.   Knee pain    Lactose intolerance    Mitral  regurgitation    a. 05/2018 Echo: mild to mod MR.   Multinodular goiter    Obesity    OSA (obstructive sleep apnea)    PAF (paroxysmal atrial fibrillation) ([place]    a.  Diagnosed 12/19; b. 05/2018 s/p DCCV; c. 03/2019 & 05/2019 recurrent AFib-->managed w/ amio load; d. CHADS2VASc = 6 (CHF, HTN, age x 2, vascular disease, female)-->Eliquis & amio 100 qd.   PAH (pulmonary artery hypertension) (HCC)    Scoliosis    SOB (shortness of breath)    Swallowing difficulty     Past Surgical History:  Procedure Laterality Date   CARDIOVERSION N/A 06/15/2018   Procedure: CARDIOVERSION (CATH LAB);  Surgeon: GMinna Merritts MD;  Location: [facility]S;  Service: Cardiovascular;  Laterality: N/A;   CARDIOVERSION N/A 02/18/2021   Procedure: CARDIOVERSION;  Surgeon: GMinna Merritts MD;  Location: [facility] ORS;  Service: Cardiovascular;  Laterality: N/A;   CATARACT EXTRACTION W/PHACO Right 01/25/2016   Procedure: CATARACT EXTRACTION PHACO AND INTRAOCULAR LENS PLACEMENT ([facility];  Surgeon: ARonnell Freshwater MD;  [place] [facility]  Service: Ophthalmology;  Laterality: Right;  RIGHT   CATARACT EXTRACTION W/PHACO Left 02/22/2016   Procedure: CATARACT EXTRACTION PHACO AND INTRAOCULAR LENS PLACEMENT ([place];  Surgeon: Ronnell Freshwater, MD;  Location: [facility];  Service: Ophthalmology;  Laterality: Left;  LEFT   HAMMER TOE SURGERY  05/16/2008   KNEE ARTHROSCOPY Right 05/16/2002   PACEMAKER IMPLANT N/A 10/25/2021   Procedure: PACEMAKER IMPLANT;  Surgeon: Vickie Epley, MD;  Location: [facility];  Service: Cardiovascular;  Laterality: N/A;   REPLACEMENT TOTAL KNEE Right 05/17/2007   [facility]   SKIN GRAFT Left 04/08/2013   Done on left index finger   TONSILLECTOMY  05/16/1944   TOTAL KNEE ARTHROPLASTY Left 12/02/2020    Current Medications: Current Meds  Medication Sig   acetaminophen (TYLENOL) 325 MG tablet Take 650 mg by mouth every 6 (six)  hours as needed for moderate pain.   benzonatate (TESSALON) 100 MG capsule Take 1 capsule (100 mg total) by mouth 2 (two) times daily as needed for cough.   bisoprolol (ZEBETA) 10 MG tablet Take 2 tablets (20 mg total) by mouth 2 (two) times daily.   buPROPion (WELLBUTRIN XL) 150 MG 24 hr tablet TAKE 1 TABLET BY MOUTH DAILY   calcium carbonate (TUMS EX) 750 MG chewable tablet Chew 2 tablets by mouth daily as needed for heartburn.   Cholecalciferol 25 MCG (1000 UT) tablet Take 1,000 Units by mouth daily.   ELIQUIS 5 MG TABS tablet TAKE ONE TABLET TWICE DAILY   ezetimibe (ZETIA) 10 MG tablet Take 1 tablet (10 mg total) by mouth daily.   furosemide (LASIX) 40 MG tablet TAKE 1 TABLET BY MOUTH DAILY. TAKE AN EXTRA TABLET AS NEEDED AFTER LUNCH FOR ABDOMINAL SWELLING, LEG SWELLING OR SHORTNESS OF BREATH   guaiFENesin-codeine 100-10 MG/5ML syrup Take 5 mLs by mouth 3 (three) times daily as needed for cough.   levalbuterol (XOPENEX HFA) 45 MCG/ACT inhaler Inhale 2 puffs into the lungs every 8 (eight) hours as needed for wheezing.   Menthol, Topical Analgesic, (BIOFREEZE EX) Apply 1 application. topically daily as needed (Neck pain).   metaxalone (SKELAXIN) 800 MG tablet Take 800 mg by mouth daily as needed for muscle spasms.   Multiple Vitamin (MULTIVITAMIN) capsule Take 1 capsule by mouth daily.   mupirocin ointment (BACTROBAN) 2 % Place 1 application  into the nose 2 (two) times daily.   naltrexone (DEPADE) 50 MG tablet TAKE 1/2 TABLET BY MOUTH DAILY   potassium chloride (KLOR-CON) 10 MEQ tablet TAKE 1 TABLET BY MOUTH DAILY    Allergies:   Levofloxacin, Influenza vaccine recombinant, Influenza vaccines, Other, and Oysters [shellfish allergy]   Social History   Socioeconomic History   Marital status: Single    Spouse name: Not on file   Number of children: 5   Years of education: college   Highest education level: Bachelor's degree (e.g., BA, AB, BS)  Occupational History   Occupation: Quarry manager: [employer]  Tobacco Use   Smoking status: Former    Packs/day: 1.00    Years: 30.00    Total pack years: 30.00    Types: Cigarettes    Quit date: 05/16/1989    Years since quitting: 33.0   Smokeless tobacco: Never  Vaping Use   Vaping Use: Never used  Substance and Sexual Activity   Alcohol use: Yes    Alcohol/week: 7.0 standard drinks of alcohol    Types: 7 Glasses of wine per week    Comment: 0-2 a night   Drug use: No   Sexual activity: Not Currently  Other Topics Concern  Not on file  Social History Narrative   Pt has a child who passed away at age 29   Social Determinants of Health   Financial Resource Strain: Low Risk  (08/13/2021)   Overall Financial Resource Strain (CARDIA)    Difficulty of Paying Living Expenses: Not hard at all  Food Insecurity: No Food Insecurity (05/17/2022)   Hunger Vital Sign    Worried About Running Out of Food in the Last Year: Never true    Ran Out of Food in the Last Year: Never true  Transportation Needs: No Transportation Needs (05/17/2022)   PRAPARE - Hydrologist (Medical): No    Lack of Transportation (Non-Medical): No  Physical Activity: Insufficiently Active (02/03/2020)   Exercise Vital Sign    Days of Exercise per Week: 2 days    Minutes of Exercise per Session: 40 min  Stress: No Stress Concern Present (02/03/2020)   [place]    Feeling of Stress : Not at all  Social Connections: Moderately Integrated (02/03/2020)   Social Connection and Isolation Panel [NHANES]    Frequency of Communication with Friends and Family: More than three times a week    Frequency of Social Gatherings with Friends and Family: More than three times a week    Attends Religious Services: More than 4 times per year    Active Member of [employer] or Organizations: Yes    Attends Music therapist: More than 4 times per  year    Marital Status: Divorced     Family History:  [name] family history includes Atrial fibrillation in her sister and sister; Breast cancer (age of onset: 13) in her sister; Healthy in her brother; Heart attack in her father; Heart disease in her mother; High blood pressure in her father; Hyperlipidemia in her sister and sister; Stroke in her father; Transient ischemic attack in her mother.   ROS:   Please see the history of present illness.    ROS All other systems reviewed and are negative.      No data to display             PHYSICAL EXAM:   VS:  BP 138/80   Pulse (!) 52   Ht '5\' 7"'$  (1.702 m)   Wt 219 lb 12.8 oz (99.7 kg)   SpO2 93%   BMI 34.43 kg/m    GEN: Well nourished, well developed, in no acute distress  HEENT: normal  Neck: no JVD, carotid bruits, or masses Cardiac: RRR; no murmurs, rubs, or gallops,no edema.  Intact distal pulses bilaterally.  Respiratory:  clear to auscultation bilaterally, normal work of breathing GI: soft, nontender, nondistended, + BS MS: no deformity or atrophy  Skin: warm and dry, no rash Neuro:  Alert and Oriented x 3, Strength and sensation are intact Psych: euthymic mood, full affect  Wt Readings from Last 3 Encounters:  05/27/22 219 lb 12.8 oz (99.7 kg)  05/25/22 217 lb (98.4 kg)  05/20/22 219 lb (99.3 kg)      Studies/Labs Reviewed:   PSG, CPAP titration and Pap compliance download  Recent Labs: 03/17/2022: ALT 22 05/11/2022: Magnesium 2.0 05/12/2022: B Natriuretic Peptide 746.2 05/25/2022: BUN 27; Creatinine, Ser 1.23; Hemoglobin 15.2; Platelets 241; Potassium 4.0; Sodium 138    CHA2DS2-VASc Score = 6   This indicates a 9.7% annual risk of stroke. [name] score is based upon: CHF History: 1 HTN History: 1 Diabetes History:  0 Stroke History: 0 Vascular Disease History: 1 Age Score: 2 Gender Score: 1     Additional studies/ records that were reviewed today include:  Office visit notes by Dr.  Rockey Situ    ASSESSMENT:    1. OSA (obstructive sleep apnea)   2. Essential hypertension      PLAN:  In order of problems listed above:  OSA - The patient is tolerating PAP therapy well without any problems. The PAP download performed by his DME was personally reviewed and interpreted by me today and showed an AHI of 3.4 /hr on auto CPAP from 4-20 cm H2O with 50% compliance in using more than 4 hours nightly.  The patient has been using and benefiting from PAP use and will continue to benefit from therapy.  -I encouraged her to be more compliant with her device>>she had RSV recently and could not use her device -encouraged her to use nasal saline spray for nose dryness and adjust her humidifier to help with dryness  2.  Hypertension -BP controlled on exam today -Continue prescription drug management bisoprolol 20 mg daily with.  Refills   Time Spent: 20 minutes total time of encounter, including 15 minutes spent in face-to-face patient care on the date of this encounter. This time includes coordination of care and counseling regarding above mentioned problem list. Remainder of non-face-to-face time involved reviewing chart documents/testing relevant to the patient encounter and documentation in the medical record. I have independently reviewed documentation from referring provider  Medication Adjustments/Labs and Tests Ordered: Current medicines are reviewed at length with the patient today.  Concerns regarding medicines are outlined above.  Medication changes, Labs and Tests ordered today are listed in the Patient Instructions below.  There are no Patient Instructions on file for this visit.   Signed, Fransico Him, MD  05/27/2022 10:40 AM    [address], [place], [place]  [address] Phone: [phone]; Fax: [phone]

## 2022-05-27 NOTE — Patient Instructions (Signed)
Medication Instructions:  Your physician recommends that you continue on your current medications as directed. Please refer to the Current Medication list given to you today.  *If you need a refill on your cardiac medications before your next appointment, please call your pharmacy*   Lab Work: None.  If you have labs (blood work) drawn today and your tests are completely normal, you will receive your results only by: MyChart Message (if you have MyChart) OR A paper copy in the mail If you have any lab test that is abnormal or we need to change your treatment, we will call you to review the results.   Testing/Procedures: None.   Follow-Up:  We recommend signing up for the patient portal called "MyChart".  Sign up information is provided on this After Visit Summary.  MyChart is used to connect with patients for Virtual Visits (Telemedicine).  Patients are able to view lab/test results, encounter notes, upcoming appointments, etc.  Non-urgent messages can be sent to your provider as well.   To learn more about what you can do with MyChart, go to [email or web address].    Your next appointment:   1 year(s)  Provider:   Dr. Traci Turner, MD   

## 2022-06-08 ENCOUNTER — Telehealth: Payer: Self-pay

## 2022-06-08 NOTE — Progress Notes (Signed)
I reach out to Roosvelt Harps to check the status of patient renewal application for Eliquis.Per Roosvelt Harps, patient was approved with a end date of 05/16/2023.Patient will need to contact the pharmacy at [phone] to confirm her address before they will ship her medication.  I have attempted without success to contact this patient by phone to inform her of the above.I also left my contact information for the patient to return my call if she has any questions.  [place] Pharmacist Assistant [phone]

## 2022-06-08 NOTE — Progress Notes (Signed)
I,Sulibeya S Dimas,acting as a Education administrator for [employer], PA-C.,have documented all relevant documentation on the behalf of Mardene Speak, PA-C,as directed by  [employer], PA-C while in the presence of [employer], PA-C.     Established patient visit   Patient: Lauren Lloyd   DOB: 1940-09-13   82 y.o. Female  MRN: [id number] Visit Date: 06/09/2022  Today's healthcare provider: Mardene Speak, PA-C   CC: L great toe pain  Subjective     HPI   Left great joint--aching, sharp pain, burning, especially with pressure. --4 days Last edited by Elta Guadeloupe, CMA on 06/09/2022  9:23 AM.      Patient C/O foot pain,   The patient reports  no redness or swelling  and denies fever. Patient does not have a history of diabetes mellitus.   Medications: Outpatient Medications Prior to Visit  Medication Sig   acetaminophen (TYLENOL) 325 MG tablet Take 650 mg by mouth every 6 (six) hours as needed for moderate pain.   benzonatate (TESSALON) 100 MG capsule Take 1 capsule (100 mg total) by mouth 2 (two) times daily as needed for cough.   bisoprolol (ZEBETA) 10 MG tablet Take 2 tablets (20 mg total) by mouth 2 (two) times daily.   buPROPion (WELLBUTRIN XL) 150 MG 24 hr tablet TAKE 1 TABLET BY MOUTH DAILY   calcium carbonate (TUMS EX) 750 MG chewable tablet Chew 2 tablets by mouth daily as needed for heartburn.   Cholecalciferol 25 MCG (1000 UT) tablet Take 1,000 Units by mouth daily.   ELIQUIS 5 MG TABS tablet TAKE ONE TABLET TWICE DAILY   ezetimibe (ZETIA) 10 MG tablet Take 1 tablet (10 mg total) by mouth daily.   furosemide (LASIX) 40 MG tablet TAKE 1 TABLET BY MOUTH DAILY. TAKE AN EXTRA TABLET AS NEEDED AFTER LUNCH FOR ABDOMINAL SWELLING, LEG SWELLING OR SHORTNESS OF BREATH   guaiFENesin-codeine 100-10 MG/5ML syrup Take 5 mLs by mouth 3 (three) times daily as needed for cough.   levalbuterol (XOPENEX HFA) 45 MCG/ACT inhaler Inhale 2 puffs into the lungs every 8 (eight) hours as needed for  wheezing.   Menthol, Topical Analgesic, (BIOFREEZE EX) Apply 1 application. topically daily as needed (Neck pain).   metaxalone (SKELAXIN) 800 MG tablet Take 800 mg by mouth daily as needed for muscle spasms.   Multiple Vitamin (MULTIVITAMIN) capsule Take 1 capsule by mouth daily.   mupirocin ointment (BACTROBAN) 2 % Place 1 application  into the nose 2 (two) times daily.   naltrexone (DEPADE) 50 MG tablet TAKE 1/2 TABLET BY MOUTH DAILY   potassium chloride (KLOR-CON) 10 MEQ tablet TAKE 1 TABLET BY MOUTH DAILY   No facility-administered medications prior to visit.    Review of Systems  All other systems reviewed and are negative. Except see HPI     Objective    BP 128/72 (BP Location: Left Arm, Patient Position: Sitting, Cuff Size: Large)   Pulse 82   Temp 98.3 F (36.8 C)   Ht '5\' 7"'$  (1.702 m)   Wt 220 lb (99.8 kg)   SpO2 98%   BMI 34.46 kg/m    Physical Exam Vitals reviewed.  Constitutional:      General: She is not in acute distress.    Appearance: Normal appearance. She is well-developed. She is not diaphoretic.  HENT:     Head: Normocephalic and atraumatic.  Eyes:     General: No scleral icterus.    Conjunctiva/sclera: Conjunctivae normal.  Neck:  Thyroid: No thyromegaly.  Cardiovascular:     Rate and Rhythm: Normal rate. Rhythm irregular.     Pulses: Normal pulses.     Heart sounds: Normal heart sounds. No murmur heard.    Comments: On eliquis, scheduled for ablation on 2.20.24 Pulmonary:     Effort: Pulmonary effort is normal. No respiratory distress.     Breath sounds: Normal breath sounds. No wheezing, rhonchi or rales.  Abdominal:     General: Abdomen is flat. Bowel sounds are normal.     Palpations: Abdomen is soft.     Tenderness: There is no abdominal tenderness. There is no guarding.  Musculoskeletal:        General: Tenderness (Distal joint of the left great toe) present. No swelling, deformity or signs of injury. Normal range of motion.      Cervical back: Normal range of motion and neck supple.     Right lower leg: No edema.     Left lower leg: No edema.  Lymphadenopathy:     Cervical: No cervical adenopathy.  Skin:    General: Skin is warm and dry.     Findings: No bruising, erythema, lesion or rash.  Neurological:     Mental Status: She is alert and oriented to person, place, and time. Mental status is at baseline.     Gait: Gait abnormal (antalgic).  Psychiatric:        Behavior: Behavior normal.        Thought Content: Thought content normal.        Judgment: Judgment normal.     No results found for any visits on 06/09/22.  Assessment & Plan     1. Pain of foot, unspecified laterality Pain in distal joint of the left great toe Improving but slow No swelling, no erythema present, no trauma or injury Normal vitals Denies having any fever Could be gout, OA Hx of OA of multiple joints Hx of gout in 2023/September, was prescribed prednisone, no labs were done Was advised to continue anti-inflammatory diet Last GFR was 44, we will recheck her BMP : - Basic metabolic panel - Uric acid/if elevated, might need "chronic " medication to reduce uric acid? Continue taking OTC pain medications Acetaminophen up to 1,000 mg TID: effective for pain relief Topical NSAIDs and capsaicin was advised. [place] and would like to be treated before the trip. Will reassess after the lab results   The patient was advised to call back or seek an in-person evaluation if the symptoms worsen or if the condition fails to improve as anticipated.  I discussed the assessment and treatment plan with the patient. The patient was provided an opportunity to ask questions and all were answered. The patient agreed with the plan and demonstrated an understanding of the instructions.  [name], Review of Systems and Physical Exam were  personally obtained by me. Portions of this information were initially documented by the CMA and reviewed by me for thoroughness and accuracy.   Mardene Speak, [facility], [place] [phone] (phone) [phone] (fax)

## 2022-06-09 ENCOUNTER — Other Ambulatory Visit: Payer: Self-pay | Admitting: Physician Assistant

## 2022-06-09 ENCOUNTER — Ambulatory Visit (INDEPENDENT_AMBULATORY_CARE_PROVIDER_SITE_OTHER): Payer: Medicare HMO | Admitting: Physician Assistant

## 2022-06-09 ENCOUNTER — Encounter: Payer: Self-pay | Admitting: Physician Assistant

## 2022-06-09 VITALS — BP 128/72 | HR 82 | Temp 98.3°F | Ht 67.0 in | Wt 220.0 lb

## 2022-06-09 DIAGNOSIS — M79673 Pain in unspecified foot: Secondary | ICD-10-CM

## 2022-06-10 ENCOUNTER — Other Ambulatory Visit: Payer: Self-pay | Admitting: Physician Assistant

## 2022-06-10 DIAGNOSIS — M10079 Idiopathic gout, unspecified ankle and foot: Secondary | ICD-10-CM

## 2022-06-10 DIAGNOSIS — M79673 Pain in unspecified foot: Secondary | ICD-10-CM

## 2022-06-10 LAB — BASIC METABOLIC PANEL
BUN/Creatinine Ratio: 18 (ref 12–28)
BUN: 22 mg/dL (ref 8–27)
CO2: 21 mmol/L (ref 20–29)
Calcium: 9.8 mg/dL (ref 8.7–10.3)
Chloride: 101 mmol/L (ref 96–106)
Creatinine, Ser: 1.19 mg/dL — ABNORMAL HIGH (ref 0.57–1.00)
Glucose: 108 mg/dL — ABNORMAL HIGH (ref 70–99)
Potassium: 4.2 mmol/L (ref 3.5–5.2)
Sodium: 140 mmol/L (ref 134–144)
eGFR: 46 mL/min/{1.73_m2} — ABNORMAL LOW (ref >59)

## 2022-06-10 LAB — URIC ACID: Uric Acid: 9.6 mg/dL — ABNORMAL HIGH (ref 3.1–7.9)

## 2022-06-10 MED ORDER — PREDNISONE 20 MG PO TABS: 20.0000 mg | ORAL_TABLET | Freq: Every day | ORAL | 0 refills | Status: DC

## 2022-06-10 NOTE — Progress Notes (Signed)
Please, let pt know that her kidney function is decreased but stable for her in comparison with 2 mo ago. Her uric acid increased. I will call in prednisone for her.

## 2022-06-21 ENCOUNTER — Other Ambulatory Visit: Payer: Self-pay | Admitting: Family Medicine

## 2022-06-21 NOTE — Telephone Encounter (Signed)
Medication Refill - Medication: bisoprolol (ZEBETA) 10 MG tablet 20 mg Pt states ER Dr increased dosage to '20mg'$   on this med  when she was in hosp on 12/26  Has the patient contacted their pharmacy? yes (Agent: If no, request that the patient contact the pharmacy for the refill. If patient does not wish to contact the pharmacy document the reason why and proceed with request.) (Agent: If yes, when and what did the pharmacy advise?)contact pcp  Preferred Pharmacy (with phone number or street name):  [place], [place] - [address] Phone: [phone]  Fax: [phone]     Has the patient been seen for an appointment in the last year OR does the patient have an upcoming appointment? yes  Agent: Please be advised that RX refills may take up to 3 business days. We ask that you follow-up with your pharmacy.

## 2022-06-22 NOTE — Telephone Encounter (Signed)
Requested medication (s) are due for refill today: yes  Requested medication (s) are on the active medication list: yes  Last refill:  05/13/22  Future visit scheduled: yes  Notes to clinic:  Unable to refill per protocol, last refill by another provider.      Requested Prescriptions  Pending Prescriptions Disp Refills   bisoprolol (ZEBETA) 10 MG tablet 60 tablet 1    Sig: Take 2 tablets (20 mg total) by mouth 2 (two) times daily.     Cardiovascular: Beta Blockers 2 Failed - 06/21/2022  5:55 PM      Failed - Cr in normal range and within 360 days    Creat  Date Value Ref Range Status  01/19/2017 0.78 0.60 - 0.93 mg/dL Final    Comment:    For patients >84 years of age, the reference limit for Creatinine is approximately 13% higher for people identified as African-American. .    Creatinine, Ser  Date Value Ref Range Status  06/09/2022 1.19 (H) 0.57 - 1.00 mg/dL Final         Passed - Last BP in normal range    BP Readings from Last 1 Encounters:  06/09/22 128/72         Passed - Last Heart Rate in normal range    Pulse Readings from Last 1 Encounters:  06/09/22 82         Passed - Valid encounter within last 6 months    Recent Outpatient Visits           1 week ago Pain of foot, unspecified laterality   [place], [place], PA-C   1 month ago Chronic diastolic CHF (congestive heart failure) [facility])   [place], Dionne Bucy, MD   3 months ago Encounter for annual wellness visit (AWV) in Medicare patient   [place], Dionne Bucy, MD   4 months ago Persistent atrial fibrillation [facility])   [name], MD   7 months ago OSA (obstructive sleep apnea)   [place] Eulas Post, MD       Future Appointments             In 3 months Bacigalupo, Dionne Bucy, MD [facility]

## 2022-06-23 MED ORDER — BISOPROLOL FUMARATE 10 MG PO TABS: 20.0000 mg | ORAL_TABLET | Freq: Two times a day (BID) | ORAL | 1 refills | Status: DC

## 2022-07-04 NOTE — Pre-Procedure Instructions (Signed)
Instructed patient on the following items: Arrival time 1200 Nothing to eat or drink after midnight No meds AM of procedure Responsible person to drive you home and stay with you for 24 hrs  Have you missed any doses of anti-coagulant Eliquis- hasn't missed any doses

## 2022-07-05 ENCOUNTER — Ambulatory Visit (HOSPITAL_COMMUNITY)
Admission: RE | Disposition: A | Discharge status: Home / Self Care | Payer: Self-pay | Source: Home / Self Care | Attending: Cardiology

## 2022-07-05 ENCOUNTER — Ambulatory Visit (HOSPITAL_COMMUNITY): Payer: Medicare HMO | Admitting: Certified Registered Nurse Anesthetist

## 2022-07-05 ENCOUNTER — Other Ambulatory Visit: Payer: Self-pay

## 2022-07-05 ENCOUNTER — Ambulatory Visit (HOSPITAL_COMMUNITY)
Admission: RE | Admit: 2022-07-05 | Discharge: 2022-07-05 | Disposition: A | Discharge status: Home / Self Care | Payer: Medicare HMO | Attending: Cardiology | Admitting: Cardiology

## 2022-07-05 DIAGNOSIS — I4821 Permanent atrial fibrillation: Secondary | ICD-10-CM | POA: Diagnosis not present

## 2022-07-05 DIAGNOSIS — Z7901 Long term (current) use of anticoagulants: Secondary | ICD-10-CM | POA: Diagnosis not present

## 2022-07-05 DIAGNOSIS — Z8249 Family history of ischemic heart disease and other diseases of the circulatory system: Secondary | ICD-10-CM | POA: Insufficient documentation

## 2022-07-05 DIAGNOSIS — I495 Sick sinus syndrome: Secondary | ICD-10-CM | POA: Insufficient documentation

## 2022-07-05 DIAGNOSIS — Z87891 Personal history of nicotine dependence: Secondary | ICD-10-CM | POA: Insufficient documentation

## 2022-07-05 DIAGNOSIS — Z950 Presence of cardiac pacemaker: Secondary | ICD-10-CM | POA: Insufficient documentation

## 2022-07-05 HISTORY — PX: AV NODE ABLATION: EP1193

## 2022-07-05 LAB — CBC
HCT: 44.4 % (ref 36.0–46.0)
Hemoglobin: 14.6 g/dL (ref 12.0–15.0)
MCH: 30.7 pg (ref 26.0–34.0)
MCHC: 32.9 g/dL (ref 30.0–36.0)
MCV: 93.5 fL (ref 80.0–100.0)
Platelets: 222 10*3/uL (ref 150–400)
RBC: 4.75 MIL/uL (ref 3.87–5.11)
RDW: 14.2 % (ref 11.5–15.5)
WBC: 8.5 10*3/uL (ref 4.0–10.5)
nRBC: 0 % (ref 0.0–0.2)

## 2022-07-05 SURGERY — AV NODE ABLATION
Anesthesia: General

## 2022-07-05 MED ORDER — ACETAMINOPHEN 325 MG PO TABS: 650.0000 mg | ORAL_TABLET | ORAL | Status: DC | PRN

## 2022-07-05 MED ORDER — HEPARIN (PORCINE) IN NACL 1000-0.9 UT/500ML-% IV SOLN
INTRAVENOUS | Status: DC | PRN
  Administered 2022-07-05: 500 mL

## 2022-07-05 MED ORDER — BUPIVACAINE HCL (PF) 0.25 % IJ SOLN
INTRAMUSCULAR | Status: AC
  Filled 2022-07-05: qty 30

## 2022-07-05 MED ORDER — MIDAZOLAM HCL 5 MG/5ML IJ SOLN
INTRAMUSCULAR | Status: DC | PRN
  Administered 2022-07-05 (×2): 1 mg via INTRAVENOUS

## 2022-07-05 MED ORDER — SODIUM CHLORIDE 0.9% FLUSH: 3.0000 mL | Freq: Two times a day (BID) | INTRAVENOUS | Status: DC

## 2022-07-05 MED ORDER — FENTANYL CITRATE (PF) 100 MCG/2ML IJ SOLN
INTRAMUSCULAR | Status: AC
  Filled 2022-07-05: qty 2

## 2022-07-05 MED ORDER — FENTANYL CITRATE (PF) 100 MCG/2ML IJ SOLN
INTRAMUSCULAR | Status: DC | PRN
  Administered 2022-07-05 (×2): 25 ug via INTRAVENOUS

## 2022-07-05 MED ORDER — MIDAZOLAM HCL 5 MG/5ML IJ SOLN
INTRAMUSCULAR | Status: AC
  Filled 2022-07-05: qty 5

## 2022-07-05 MED ORDER — CEFAZOLIN SODIUM-DEXTROSE 2-4 GM/100ML-% IV SOLN
2.0000 g | Freq: Once | INTRAVENOUS | Status: AC
  Administered 2022-07-05: 2 g via INTRAVENOUS

## 2022-07-05 MED ORDER — SODIUM CHLORIDE 0.9 % IV SOLN: INTRAVENOUS | Status: DC

## 2022-07-05 MED ORDER — SODIUM CHLORIDE 0.9% FLUSH: 3.0000 mL | INTRAVENOUS | Status: DC | PRN

## 2022-07-05 MED ORDER — CEFAZOLIN SODIUM-DEXTROSE 2-4 GM/100ML-% IV SOLN
INTRAVENOUS | Status: AC
  Filled 2022-07-05: qty 100

## 2022-07-05 MED ORDER — CEFAZOLIN SODIUM-DEXTROSE 2-3 GM-%(50ML) IV SOLR: 2.0000 g | Freq: Once | INTRAVENOUS | Status: DC

## 2022-07-05 MED ORDER — SODIUM CHLORIDE 0.9 % IV SOLN: 250.0000 mL | INTRAVENOUS | Status: DC | PRN

## 2022-07-05 MED ORDER — ONDANSETRON HCL 4 MG/2ML IJ SOLN: 4.0000 mg | Freq: Four times a day (QID) | INTRAMUSCULAR | Status: DC | PRN

## 2022-07-05 MED ORDER — BUPIVACAINE HCL (PF) 0.25 % IJ SOLN
INTRAMUSCULAR | Status: DC | PRN
  Administered 2022-07-05: 20 mL

## 2022-07-05 MED ORDER — HEPARIN SODIUM (PORCINE) 1000 UNIT/ML IJ SOLN
INTRAMUSCULAR | Status: AC
  Filled 2022-07-05: qty 10

## 2022-07-05 SURGICAL SUPPLY — 10 items
BAG SNAP BAND KOVER 36X36 (MISCELLANEOUS) IMPLANT
CATH SMTCH THERMOCOOL SF FJ (CATHETERS) IMPLANT
CLOSURE PERCLOSE PROSTYLE (VASCULAR PRODUCTS) IMPLANT
MAT PREVALON FULL STRYKER (MISCELLANEOUS) IMPLANT
PACK EP LATEX FREE (CUSTOM PROCEDURE TRAY) ×1
PACK EP LF (CUSTOM PROCEDURE TRAY) ×1 IMPLANT
PAD DEFIB RADIO PHYSIO CONN (PAD) ×1 IMPLANT
PATCH CARTO3 (PAD) IMPLANT
SHEATH PINNACLE 8F 10CM (SHEATH) IMPLANT
TUBING SMART ABLATE COOLFLOW (TUBING) IMPLANT

## 2022-07-05 NOTE — H&P (Signed)
Electrophysiology Office Follow up Visit Note:     Date:  07/05/2022    ID:  Lauren Lloyd, DOB 16-Nov-1940, MRN [id number]   PCP:  [name], MD       [facility] Cardiologist:  Ida Rogue, MD  [facility] Electrophysiologist:  Vickie Epley, MD      Interval History:     Lauren Lloyd is a 82 y.o. female who presents for a follow up visit. They were last seen in clinic January 26, 2022.  She has permanent atrial fibrillation on Eliquis.  We discussed AV nodal ablation at our last appointment.  She thought about it with her family and has decided to proceed.  This is scheduled for July 05, 2022.   She is still recovering after her recent hospitalization for RSV pneumonia.  They increased her bisoprolol.  She is not tolerating the increased dose of bisoprolol well with increased fatigue.  She is very eager to have the AV nodal ablation to be out of reduce/discontinue her beta-blocker.  We discussed that she would need to continue her anticoagulant indefinitely for stroke risk mitigation.  Presents for AV node ablation today.   Objective      Past Medical History:  Diagnosis Date   (HFpEF) heart failure with preserved ejection fraction ([place])      a. 05/2018 Echo: EF 55-60%, no rwma, mild to mod MR. Nl RV fxn. Mod TR. PASP 33mHg.   Arthritis      knees, Hands   Arthritis of knee     Back pain     Carotid arterial disease ([place]      a. 03/2019 Carotid U/S: <50% bilat ICA stenoses.   Cholelithiasis      a. 10/2018 noted on CT.   Edema, lower extremity     Fatty liver     GERD (gastroesophageal reflux disease)     History of stress test      a. 06/2018 MV: EF 59%, no ischemia/infarct. Low risk.   Knee pain     Lactose intolerance     Mitral regurgitation      a. 05/2018 Echo: mild to mod MR.   Multinodular goiter     Obesity     OSA (obstructive sleep apnea)     PAF (paroxysmal atrial fibrillation) ([place]      a.  Diagnosed 12/19; b. 05/2018 s/p DCCV;  c. 03/2019 & 05/2019 recurrent AFib-->managed w/ amio load; d. CHADS2VASc = 6 (CHF, HTN, age x 2, vascular disease, female)-->Eliquis & amio 100 qd.   PAH (pulmonary artery hypertension) (HCC)     Scoliosis     SOB (shortness of breath)     Swallowing difficulty             Past Surgical History:  Procedure Laterality Date   CARDIOVERSION N/A 06/15/2018    Procedure: CARDIOVERSION (CATH LAB);  Surgeon: GMinna Merritts MD;  Location: [facility] ORS;  Service: Cardiovascular;  Laterality: N/A;   CARDIOVERSION N/A 02/18/2021    Procedure: CARDIOVERSION;  Surgeon: GMinna Merritts MD;  Location: [facility] ORS;  Service: Cardiovascular;  Laterality: N/A;   CATARACT EXTRACTION W/PHACO Right 01/25/2016    Procedure: CATARACT EXTRACTION PHACO AND INTRAOCULAR LENS PLACEMENT ([place];  Surgeon: ARonnell Freshwater MD;  [place]  Service: Ophthalmology;  Laterality: Right;  RIGHT   CATARACT EXTRACTION W/PHACO Left 02/22/2016    Procedure: CATARACT EXTRACTION PHACO AND INTRAOCULAR LENS PLACEMENT ([place];  Surgeon: ARonnell Freshwater MD;  Location: [place];  Service: Ophthalmology;  Laterality: Left;  LEFT   HAMMER TOE SURGERY   05/16/2008   KNEE ARTHROSCOPY Right 05/16/2002   PACEMAKER IMPLANT N/A 10/25/2021    Procedure: PACEMAKER IMPLANT;  Surgeon: Vickie Epley, MD;  Location: [place] [facility];  Service: Cardiovascular;  Laterality: N/A;   REPLACEMENT TOTAL KNEE Right 05/17/2007    [facility]   SKIN GRAFT Left 04/08/2013    Done on left index finger   TONSILLECTOMY   05/16/1944   TOTAL KNEE ARTHROPLASTY Left 12/02/2020      Current Medications: Active Medications      Current Meds  Medication Sig   acetaminophen (TYLENOL) 325 MG tablet Take 650 mg by mouth every 6 (six) hours as needed for moderate pain.   benzonatate (TESSALON) 100 MG capsule Take 1 capsule (100 mg total) by mouth 2 (two) times daily as needed for cough.    bisoprolol (ZEBETA) 10 MG tablet Take 2 tablets (20 mg total) by mouth 2 (two) times daily.   buPROPion (WELLBUTRIN XL) 150 MG 24 hr tablet TAKE 1 TABLET BY MOUTH DAILY   calcium carbonate (TUMS EX) 750 MG chewable tablet Chew 2 tablets by mouth daily as needed for heartburn.   Cholecalciferol 25 MCG (1000 UT) tablet Take 1,000 Units by mouth daily.   ELIQUIS 5 MG TABS tablet TAKE ONE TABLET TWICE DAILY   ezetimibe (ZETIA) 10 MG tablet Take 1 tablet (10 mg total) by mouth daily.   furosemide (LASIX) 40 MG tablet TAKE 1 TABLET BY MOUTH DAILY. TAKE AN EXTRA TABLET AS NEEDED AFTER LUNCH FOR ABDOMINAL SWELLING, LEG SWELLING OR SHORTNESS OF BREATH   guaiFENesin-codeine 100-10 MG/5ML syrup Take 5 mLs by mouth 3 (three) times daily as needed for cough.   levalbuterol (XOPENEX HFA) 45 MCG/ACT inhaler Inhale 2 puffs into the lungs every 8 (eight) hours as needed for wheezing.   Menthol, Topical Analgesic, (BIOFREEZE EX) Apply 1 application. topically daily as needed (Neck pain).   metaxalone (SKELAXIN) 800 MG tablet Take 800 mg by mouth daily as needed for muscle spasms.   Multiple Vitamin (MULTIVITAMIN) capsule Take 1 capsule by mouth daily.   mupirocin ointment (BACTROBAN) 2 % Place 1 application  into the nose 2 (two) times daily.   naltrexone (DEPADE) 50 MG tablet TAKE 1/2 TABLET BY MOUTH DAILY   potassium chloride (KLOR-CON) 10 MEQ tablet TAKE 1 TABLET BY MOUTH DAILY        Allergies:   Levofloxacin, Influenza vaccine recombinant, Influenza vaccines, Other, and Oysters [shellfish allergy]    Social History         Socioeconomic History   Marital status: Single      Spouse name: Not on file   Number of children: 5   Years of education: college   Highest education level: Bachelor's degree (e.g., BA, AB, BS)  Occupational History   Occupation: Dentist: [employer]  Tobacco Use   Smoking status: Former      Packs/day: 1.00      Years: 30.00      Total  pack years: 30.00      Types: Cigarettes      Quit date: 05/16/1989      Years since quitting: 33.0   Smokeless tobacco: Never  Vaping Use   Vaping Use: Never used  Substance and Sexual Activity   Alcohol use: Yes      Alcohol/week: 7.0 standard  drinks of alcohol      Types: 7 Glasses of wine per week      Comment: 0-2 a night   Drug use: No   Sexual activity: Not Currently  Other Topics Concern   Not on file  Social History Narrative    Pt has a child who passed away at age 70    Social Determinants of Health        Financial Resource Strain: Low Risk  (08/13/2021)    Overall Financial Resource Strain (CARDIA)     Difficulty of Paying Living Expenses: Not hard at all  Food Insecurity: No Food Insecurity (05/17/2022)    Hunger Vital Sign     Worried About Running Out of Food in the Last Year: Never true     Ran Out of Food in the Last Year: Never true  Transportation Needs: No Transportation Needs (05/17/2022)    PRAPARE - Armed forces logistics/support/administrative officer (Medical): No     Lack of Transportation (Non-Medical): No  Physical Activity: Insufficiently Active (02/03/2020)    Exercise Vital Sign     Days of Exercise per Week: 2 days     Minutes of Exercise per Session: 40 min  Stress: No Stress Concern Present (02/03/2020)    [place]     Feeling of Stress : Not at all  Social Connections: Moderately Integrated (02/03/2020)    Social Connection and Isolation Panel [NHANES]     Frequency of Communication with Friends and Family: More than three times a week     Frequency of Social Gatherings with Friends and Family: More than three times a week     Attends Religious Services: More than 4 times per year     Active Member of [employer] or Organizations: Yes     Attends Music therapist: More than 4 times per year     Marital Status: Divorced      Family History: [name] family history includes  Atrial fibrillation in her sister and sister; Breast cancer (age of onset: 17) in her sister; Healthy in her brother; Heart attack in her father; Heart disease in her mother; High blood pressure in her father; Hyperlipidemia in her sister and sister; Stroke in her father; Transient ischemic attack in her mother.   ROS:   Please see the history of present illness.    All other systems reviewed and are negative.   EKGs/Labs/Other Studies Reviewed:     The following studies were reviewed today:   May 25, 2022 in clinic device interrogation personally reviewed Battery longevity 9 years Lead parameters stable Elevated ventricular rate 100% A-fib burden         Recent Labs: 03/17/2022: ALT 22 05/11/2022: Hemoglobin 14.1; Magnesium 2.0; Platelets 191 05/12/2022: B Natriuretic Peptide 746.2 05/13/2022: BUN 34; Creatinine, Ser 0.95; Potassium 3.8; Sodium 140  Recent Lipid Panel Labs (Brief)          Component Value Date/Time    CHOL 162 03/17/2022 0909    TRIG 94 03/17/2022 0909    HDL 52 03/17/2022 0909    CHOLHDL 3.1 03/17/2022 0909    CHOLHDL 3.6 05/31/2018 0452    VLDL 21 05/31/2018 0452    LDLCALC 93 03/17/2022 0909        Physical Exam:     VS:  BP 148/72   Pulse 93   Ht 5' 7"$  (1.702 m)   Wt 217 lb (98.4 kg)  BMI 33.99 kg/m         Wt Readings from Last 3 Encounters:  05/25/22 217 lb (98.4 kg)  05/20/22 219 lb (99.3 kg)  05/10/22 220 lb (99.8 kg)      GEN:  Well nourished, well developed in no acute distress HEENT: Normal NECK: No JVD LYMPHATICS: No lymphadenopathy CARDIAC: Irregularly irregular, no murmurs, rubs, gallops.  Pacemaker pocket well-healed RESPIRATORY:  Clear to auscultation without rales, wheezing or rhonchi  ABDOMEN: Soft, non-tender, non-distended MUSCULOSKELETAL:  No edema; No deformity  SKIN: Warm and dry NEUROLOGIC:  Alert and oriented x 3 PSYCHIATRIC:  Normal affect            Assessment ASSESSMENT:     1. Permanent atrial  fibrillation ([place])   2. Tachycardia-bradycardia syndrome ([place])     PLAN:     In order of problems listed above:   #Permanent atrial fibrillation #Tachybradycardia syndrome #Pacemaker in situ The patient has had very difficult to control atrial fibrillation rates given her diagnosis of tachybradycardia syndrome.  She has a permanent pacemaker in situ that is functioning well.  Planning for AV nodal ablation on July 05, 2022.  Procedure reviewed today including the risks and recovery and she wishes to proceed.   Therapeutic strategies for AF including medicine and ablation were discussed in detail with the patient today. Risk, benefits, and alternatives to AV nodal ablation were also discussed in detail today. These risks include but are not limited to stroke, bleeding, vascular damage, tamponade, perforation, damage to the heart and other structures, worsening renal function, and death. The patient understands these risk and wishes to proceed.      Presents for AVN ablation today.     Signed, Lars Mage, MD, [facility], [facility] 07/05/2022   Electrophysiology [facility]

## 2022-07-05 NOTE — Progress Notes (Signed)
Pt ambulated to and from bathroom with no signs of oozing from Right Groin site

## 2022-07-05 NOTE — Anesthesia Preprocedure Evaluation (Signed)
Anesthesia Evaluation  Patient identified by MRN, date of birth, ID band Patient awake    Reviewed: Allergy & Precautions, H&P , NPO status , Patient's Chart, lab work & pertinent test results  Airway Mallampati: II  TM Distance: >3 FB Neck ROM: Full    Dental no notable dental hx.    Pulmonary sleep apnea , COPD,  COPD inhaler, former smoker Pulmonary HTN   breath sounds clear to auscultation + decreased breath sounds      Cardiovascular hypertension, + dysrhythmias Atrial Fibrillation + Valvular Problems/Murmurs MR  Rhythm:Irregular Rate:Normal     Neuro/Psych negative neurological ROS  negative psych ROS   GI/Hepatic negative GI ROS, Neg liver ROS,,,  Endo/Other  negative endocrine ROS    Renal/GU negative Renal ROS  negative genitourinary   Musculoskeletal negative musculoskeletal ROS (+)    Abdominal   Peds negative pediatric ROS (+)  Hematology negative hematology ROS (+)   Anesthesia Other Findings   Reproductive/Obstetrics negative OB ROS                             Anesthesia Physical Anesthesia Plan  ASA: 3  Anesthesia Plan: General   Post-op Pain Management: Minimal or no pain anticipated   Induction: Intravenous  PONV Risk Score and Plan: 3 and Ondansetron, Dexamethasone and Treatment may vary due to age or medical condition  Airway Management Planned: Oral ETT  Additional Equipment:   Intra-op Plan:   Post-operative Plan: Extubation in OR  Informed Consent: I have reviewed the patients History and Physical, chart, labs and discussed the procedure including the risks, benefits and alternatives for the proposed anesthesia with the patient or authorized representative who has indicated his/her understanding and acceptance.     Dental advisory given  Plan Discussed with: CRNA and Surgeon  Anesthesia Plan Comments:        Anesthesia Quick Evaluation

## 2022-07-05 NOTE — Discharge Instructions (Signed)

## 2022-07-06 ENCOUNTER — Encounter (HOSPITAL_COMMUNITY): Payer: Self-pay | Admitting: Cardiology

## 2022-07-06 MED FILL — Heparin Sodium (Porcine) Inj 1000 Unit/ML: INTRAMUSCULAR | Qty: 10 | Status: AC

## 2022-07-13 ENCOUNTER — Telehealth: Payer: Self-pay | Admitting: Family Medicine

## 2022-07-13 NOTE — Telephone Encounter (Signed)
Pt called to report that she has been told by numerous pharmacies that they do not have naltrexone (DEPADE) 50 MG tablet in stock. She is requesting an alternative option, please advise

## 2022-07-14 NOTE — Telephone Encounter (Signed)
Patient advised.

## 2022-07-14 NOTE — Telephone Encounter (Signed)
There is not a good alternative. Keep taking wellbutrin alone without naltrexone until it is available at the pharmacy.

## 2022-07-15 DIAGNOSIS — G4733 Obstructive sleep apnea (adult) (pediatric): Secondary | ICD-10-CM | POA: Diagnosis not present

## 2022-07-19 ENCOUNTER — Ambulatory Visit: Payer: Medicare HMO | Admitting: Student

## 2022-07-21 ENCOUNTER — Encounter: Payer: Self-pay | Admitting: Student

## 2022-07-21 ENCOUNTER — Ambulatory Visit: Payer: Medicare HMO | Attending: Student | Admitting: Student

## 2022-07-21 VITALS — BP 116/80 | HR 90 | Ht 67.0 in | Wt 224.8 lb

## 2022-07-21 DIAGNOSIS — I4821 Permanent atrial fibrillation: Secondary | ICD-10-CM | POA: Diagnosis not present

## 2022-07-21 DIAGNOSIS — Z950 Presence of cardiac pacemaker: Secondary | ICD-10-CM

## 2022-07-21 DIAGNOSIS — G4733 Obstructive sleep apnea (adult) (pediatric): Secondary | ICD-10-CM | POA: Diagnosis not present

## 2022-07-21 DIAGNOSIS — I495 Sick sinus syndrome: Secondary | ICD-10-CM | POA: Diagnosis not present

## 2022-07-21 DIAGNOSIS — I10 Essential (primary) hypertension: Secondary | ICD-10-CM

## 2022-07-21 LAB — CUP PACEART INCLINIC DEVICE CHECK
Date Time Interrogation Session: 20240307115542
Implantable Lead Connection Status: 753985
Implantable Lead Connection Status: 753985
Implantable Lead Implant Date: 20230612
Implantable Lead Implant Date: 20230612
Implantable Lead Location: 753859
Implantable Lead Location: 753860
Implantable Lead Model: 7841
Implantable Lead Model: 7842
Implantable Lead Serial Number: 1182863
Implantable Lead Serial Number: 1274488
Implantable Pulse Generator Implant Date: 20230612
Lead Channel Impedance Value: 530 Ohm
Lead Channel Impedance Value: 666 Ohm
Lead Channel Setting Pacing Amplitude: 1.2 V
Lead Channel Setting Pacing Pulse Width: 0.4 ms
Lead Channel Setting Sensing Sensitivity: 3.5 mV
Pulse Gen Serial Number: 111954
Zone Setting Status: 755011

## 2022-07-21 NOTE — Progress Notes (Signed)
  Cardiology Office Note:   Date:  07/21/2022  ID:  Lauren Lloyd, DOB [birth year], MRN [id number]  Primary Cardiologist: Ida Rogue, MD Electrophysiologist: Vickie Epley, MD   History of Present Illness:   Lauren Lloyd is a 82 y.o. female with tachy brady syndrome, permanent AF, and RVR s/p AV nodal ablation for routine electrophysiology followup s/p AV nodal ablation.   Since procedure she has had fatigue, but no worse than prior, maybe slightly better. She is SOB after walking one grocery store aisle, or from the lobby to the room here. She denies chest pain, edema, or syncope.   Review of systems complete and found to be negative unless listed in HPI.   Device History: Engineer, agricultural PPM implanted 2023 for Tachy-Brady syndrome     Studies Reviewed:    PPM Interrogation-  reviewed in detail today,  See PACEART report.  EKG is not ordered today  Risk Assessment/Calculations:    CHA2DS2-VASc Score = 6   Physical Exam:   VS:  BP 116/80 (BP Location: Right Arm, Patient Position: Sitting, Cuff Size: Large)   Pulse 90   Ht '5\' 7"'$  (1.702 m)   Wt 224 lb 12.8 oz (102 kg)   SpO2 95%   BMI 35.21 kg/m    Wt Readings from Last 3 Encounters:  07/21/22 224 lb 12.8 oz (102 kg)  07/05/22 221 lb (100.2 kg)  06/09/22 220 lb (99.8 kg)     GEN: Well nourished, well developed in no acute distress NECK: No JVD; No carotid bruits CARDIAC: Regular rate and rhythm (AV nodal ablation), no murmurs, rubs, gallops RESPIRATORY:  Clear to auscultation without rales, wheezing or rhonchi  ABDOMEN: Soft, non-tender, non-distended EXTREMITIES:  No edema; No deformity   ASSESSMENT AND PLAN:    Tachy-Brady syndrome s/p [employer] PPM  S/p AV nodal ablation 06/2022 Normal PPM function See Pace Art report No changes today LRL decreased to 80 bpm today. Rate response turned on with some fatigue.   Deconditioning Re-assurance given that with decreased activity for past  6-12 months, she will need to gradually work her way back up to activity.   Disposition:   Follow up with EP APP in 2 weeks for further post AV nodal ablation device adjustment.   Signed, Shirley Friar, PA-C

## 2022-07-21 NOTE — Patient Instructions (Signed)
Medication Instructions:   Your physician recommends that you continue on your current medications as directed. Please refer to the Current Medication list given to you today.   *If you need a refill on your cardiac medications before your next appointment, please call your pharmacy*   Lab Work:  None ordered.  If you have labs (blood work) drawn today and your tests are completely normal, you will receive your results only by: [place] (if you have MyChart) OR A paper copy in the mail If you have any lab test that is abnormal or we need to change your treatment, we will call you to review the results.   Testing/Procedures:  None ordered.   Follow-Up: At [facility], you and your health needs are our priority.  As part of our continuing mission to provide you with exceptional heart care, we have created designated Provider Care Teams.  These Care Teams include your primary Cardiologist (physician) and Advanced Practice Providers (APPs -  Physician Assistants and Nurse Practitioners) who all work together to provide you with the care you need, when you need it.  We recommend signing up for the patient portal called "MyChart".  Sign up information is provided on this After Visit Summary.  MyChart is used to connect with patients for Virtual Visits (Telemedicine).  Patients are able to view lab/test results, encounter notes, upcoming appointments, etc.  Non-urgent messages can be sent to your provider as well.   To learn more about what you can do with MyChart, go to [email or web address].    Your next appointment:   2 week(s)  Provider:   Legrand Como "Oda Kilts, PA-C

## 2022-07-22 ENCOUNTER — Encounter: Payer: Self-pay | Admitting: Cardiology

## 2022-07-27 ENCOUNTER — Ambulatory Visit (INDEPENDENT_AMBULATORY_CARE_PROVIDER_SITE_OTHER): Payer: Medicare HMO

## 2022-07-27 DIAGNOSIS — I495 Sick sinus syndrome: Secondary | ICD-10-CM | POA: Diagnosis not present

## 2022-07-29 LAB — CUP PACEART REMOTE DEVICE CHECK
Battery Remaining Longevity: 138 mo
Battery Remaining Percentage: 100 %
Brady Statistic RA Percent Paced: 0 %
Brady Statistic RV Percent Paced: 100 %
Date Time Interrogation Session: 20240313041100
Implantable Lead Connection Status: 753985
Implantable Lead Connection Status: 753985
Implantable Lead Implant Date: 20230612
Implantable Lead Implant Date: 20230612
Implantable Lead Location: 753859
Implantable Lead Location: 753860
Implantable Lead Model: 7841
Implantable Lead Model: 7842
Implantable Lead Serial Number: 1182863
Implantable Lead Serial Number: 1274488
Implantable Pulse Generator Implant Date: 20230612
Lead Channel Impedance Value: 533 Ohm
Lead Channel Impedance Value: 662 Ohm
Lead Channel Pacing Threshold Amplitude: 0.7 V
Lead Channel Pacing Threshold Pulse Width: 0.4 ms
Lead Channel Setting Pacing Amplitude: 1.2 V
Lead Channel Setting Pacing Pulse Width: 0.4 ms
Lead Channel Setting Sensing Sensitivity: 3.5 mV
Pulse Gen Serial Number: 111954
Zone Setting Status: 755011

## 2022-08-02 ENCOUNTER — Encounter: Payer: Medicare HMO | Admitting: Student

## 2022-08-03 ENCOUNTER — Ambulatory Visit: Payer: Self-pay

## 2022-08-03 ENCOUNTER — Ambulatory Visit (INDEPENDENT_AMBULATORY_CARE_PROVIDER_SITE_OTHER): Payer: Medicare HMO | Admitting: Family Medicine

## 2022-08-03 ENCOUNTER — Encounter: Payer: Self-pay | Admitting: Family Medicine

## 2022-08-03 VITALS — BP 101/56 | HR 88 | Temp 97.8°F | Resp 24 | Wt 224.7 lb

## 2022-08-03 DIAGNOSIS — N289 Disorder of kidney and ureter, unspecified: Secondary | ICD-10-CM | POA: Diagnosis not present

## 2022-08-03 DIAGNOSIS — M10371 Gout due to renal impairment, right ankle and foot: Secondary | ICD-10-CM

## 2022-08-03 MED ORDER — GABAPENTIN 600 MG PO TABS: 600.0000 mg | ORAL_TABLET | Freq: Every day | ORAL | 0 refills | Status: DC

## 2022-08-03 MED ORDER — COLCHICINE 0.6 MG PO TABS: ORAL_TABLET | ORAL | 0 refills | Status: DC

## 2022-08-03 NOTE — Telephone Encounter (Signed)
     Chief Complaint: Pain, swelling right foot Symptoms: Above Frequency: 2 days ago Pertinent Negatives: Patient denies fever Disposition: [] ED /[] Urgent Care (no appt availability in office) / [x] Appointment(In office/virtual)/ []  [place] [facility]/ [] Home Care/ [] Refused Recommended Disposition /[] [place] [facility]/ []  Follow-up with PCP Additional Notes: PEC agent made appointment.  Answer Assessment - Initial Assessment Questions 1. ONSET: "When did the pain start?"      2 days ago 2. LOCATION: "Where is the pain located?"      Right foot 3. PAIN: "How bad is the pain?"    (Scale 1-10; or mild, moderate, severe)  - MILD (1-3): doesn't interfere with normal activities.   - MODERATE (4-7): interferes with normal activities (e.g., work or school) or awakens from sleep, limping.   - SEVERE (8-10): excruciating pain, unable to do any normal activities, unable to walk.      With movement - 8 4. WORK OR EXERCISE: "Has there been any recent work or exercise that involved this part of the body?"      No 5. CAUSE: "What do you think is causing the foot pain?"     Gout 6. OTHER SYMPTOMS: "Do you have any other symptoms?" (e.g., leg pain, rash, fever, numbness)     Blister 7. PREGNANCY: "Is there any chance you are pregnant?" "When was your last menstrual period?"     No  Protocols used: Foot Pain-A-AH

## 2022-08-03 NOTE — Progress Notes (Signed)
I,J'ya E Hunter,acting as a scribe for Gwyneth Sprout, FNP.,have documented all relevant documentation on the behalf of Gwyneth Sprout, FNP,as directed by  Gwyneth Sprout, FNP while in the presence of Gwyneth Sprout, FNP.   Established patient visit   Patient: Lauren Lloyd   DOB: [birth year]   82 y.o. Female  MRN: [id number] Visit Date: 08/03/2022  Today's healthcare provider: Gwyneth Sprout, FNP   Chief Complaint  Patient presents with   Foot Pain   Subjective    HPI  Patient C/O right foot pain x 2 days. She reports swelling and redness. She denies any injuries. She reports having gout twice in the past.    Medications: Outpatient Medications Prior to Visit  Medication Sig   acetaminophen (TYLENOL) 325 MG tablet Take 650 mg by mouth every 6 (six) hours as needed for moderate pain.   benzonatate (TESSALON) 100 MG capsule Take 1 capsule (100 mg total) by mouth 2 (two) times daily as needed for cough.   bisoprolol (ZEBETA) 10 MG tablet Take 2 tablets (20 mg total) by mouth 2 (two) times daily.   buPROPion (WELLBUTRIN XL) 150 MG 24 hr tablet TAKE 1 TABLET BY MOUTH DAILY   calcium carbonate (TUMS EX) 750 MG chewable tablet Chew 2 tablets by mouth daily as needed for heartburn.   Cholecalciferol 25 MCG (1000 UT) tablet Take 1,000 Units by mouth daily.   ELIQUIS 5 MG TABS tablet TAKE ONE TABLET TWICE DAILY   ezetimibe (ZETIA) 10 MG tablet Take 1 tablet (10 mg total) by mouth daily.   furosemide (LASIX) 40 MG tablet TAKE 1 TABLET BY MOUTH DAILY. TAKE AN EXTRA TABLET AS NEEDED AFTER LUNCH FOR ABDOMINAL SWELLING, LEG SWELLING OR SHORTNESS OF BREATH   levalbuterol (XOPENEX HFA) 45 MCG/ACT inhaler Inhale 2 puffs into the lungs every 8 (eight) hours as needed for wheezing.   Menthol, Topical Analgesic, (BIOFREEZE EX) Apply 1 application. topically daily as needed (Neck pain).   metaxalone (SKELAXIN) 800 MG tablet Take 800 mg by mouth daily as needed for muscle spasms.   Multiple Vitamin  (MULTIVITAMIN) capsule Take 1 capsule by mouth daily.   mupirocin ointment (BACTROBAN) 2 % Place 1 application  into the nose 2 (two) times daily.   potassium chloride (KLOR-CON) 10 MEQ tablet TAKE 1 TABLET BY MOUTH DAILY   naltrexone (DEPADE) 50 MG tablet TAKE 1/2 TABLET BY MOUTH DAILY (Patient not taking: Reported on 08/03/2022)   No facility-administered medications prior to visit.    Review of Systems  Constitutional:  Negative for appetite change, chills and fever.  Respiratory:  Positive for chest tightness and shortness of breath.   Cardiovascular:  Positive for chest pain.  Musculoskeletal:  Positive for arthralgias and myalgias.     Objective    BP (!) 101/56 (BP Location: Right Arm, Patient Position: Sitting, Cuff Size: Large)   Pulse 88   Temp 97.8 F (36.6 C) (Temporal)   Resp (!) 24   Wt 224 lb 11.2 oz (101.9 kg)   SpO2 95%   BMI 35.19 kg/m   Physical Exam Vitals and nursing note reviewed.  Constitutional:      General: She is not in acute distress.    Appearance: Normal appearance. She is obese. She is not ill-appearing, toxic-appearing or diaphoretic.  HENT:     Head: Normocephalic and atraumatic.  Cardiovascular:     Rate and Rhythm: Normal rate and regular rhythm.     Pulses: Normal  pulses.     Heart sounds: Normal heart sounds. No murmur heard.    No friction rub. No gallop.  Pulmonary:     Effort: Pulmonary effort is normal. No respiratory distress.     Breath sounds: Normal breath sounds. No stridor. No wheezing, rhonchi or rales.  Chest:     Chest wall: No tenderness.  Musculoskeletal:        General: Swelling and tenderness present. No deformity or signs of injury. Normal range of motion.     Right lower leg: Edema present.     Left lower leg: No edema.       Feet:  Skin:    General: Skin is warm and dry.     Capillary Refill: Capillary refill takes less than 2 seconds.     Coloration: Skin is not jaundiced or pale.     Findings: Erythema  present. No bruising, lesion or rash.  Neurological:     General: No focal deficit present.     Mental Status: She is alert and oriented to person, place, and time. Mental status is at baseline.     Cranial Nerves: No cranial nerve deficit.     Sensory: No sensory deficit.     Motor: No weakness.     Coordination: Coordination normal.  Psychiatric:        Mood and Affect: Mood normal.        Behavior: Behavior normal.        Thought Content: Thought content normal.        Judgment: Judgment normal.     No results found for any visits on 08/03/22.  Assessment & Plan     Problem List Items Addressed This Visit       Musculoskeletal and Integument   Acute gout due to renal impairment involving toe of right foot - Primary    Acute, R great toe; self limiting Continue tylenol and topical NSAIDs Colchicine called in to assist with gabapentin Hold NSAIDs d/t CHF Education provided; 1 wk f/u with PCP Potential cause from dehydration with lasix as well as poor heart function and known kidney impairment       Relevant Medications   colchicine 0.6 MG tablet   No follow-ups on file.     Vonna Kotyk, FNP, have reviewed all documentation for this visit. The documentation on 08/03/22 for the exam, diagnosis, procedures, and orders are all accurate and complete.  Gwyneth Sprout, [place] [phone] (phone) [phone] (fax)  [place]

## 2022-08-03 NOTE — Progress Notes (Unsigned)
  Electrophysiology Office Note:   Date:  08/04/2022  ID:  ZER Lauren Lloyd, DOB 04/22/1941, MRN [id number]  Primary Cardiologist: Ida Rogue, MD Electrophysiologist: Vickie Epley, MD   History of Present Illness:   Lauren Lloyd is a 82 y.o. female for tachy brady syndrome, permanent AF, and RVR s/p AV nodal ablation for routine electrophysiology followup s/p AV nodal ablation.   Seen 3/7 and LRL changed to DDDR 80  Since LRL turned down, she has felt about the same, maybe a little better. Main complaint is still SOB. She is not very active, and prior to her AV nodal ablation had been relatively inactive for approx 3 years. She is also having various aches and pains, including Gout for which she is newly treated. Hasn't felt like she has needed any extra lasix.   Review of systems complete and found to be negative unless listed in HPI.   Device History: Engineer, agricultural PPM implanted 2023 for Tachy-Brady syndrome    Studies Reviewed:    PPM Interrogation-  brief check to lower LRL. See PACEART report.  EKG is not ordered today  Risk Assessment/Calculations:    CHA2DS2-VASc Score = 6            Physical Exam:   VS:  BP 120/80   Pulse 78   Ht 5\' 7"  (1.702 m)   Wt 223 lb (101.2 kg)   SpO2 94%   BMI 34.93 kg/m    Wt Readings from Last 3 Encounters:  08/04/22 223 lb (101.2 kg)  08/03/22 224 lb 11.2 oz (101.9 kg)  07/21/22 224 lb 12.8 oz (102 kg)     GEN: Well nourished, well developed in no acute distress NECK: No JVD; No carotid bruits CARDIAC: Regular rate and rhythm, no murmurs, rubs, gallops RESPIRATORY:  Clear to auscultation without rales, wheezing or rhonchi  ABDOMEN: Soft, non-tender, non-distended EXTREMITIES:  No edema; No deformity   ASSESSMENT AND PLAN:    Tachy-Brady syndrome s/p [employer] PPM  S/p AV nodal ablation 06/2022 Normal PPM function See Pace Art report LRL adjusted to 70 (rate response on)  Permanent  AF Continue Eliquis 5 mg BID   Deconditioning Continue increasing activity as tolerated Her dyspnea seems out of proportion to what I would expect from her cardiac issues. Prior echo suggestive of pulmonary HTN.  Update echo. If unremarkable, will plan PFTs +/- pulmonary referral.  References in her chart re: pulmonology as far back as 2022, and pt had previously been scheduled for PFTs, but I cannot see that they were done.   Disposition:   Follow up with Dr. Quentin Ore in 3 months  Signed, Shirley Friar, PA-C

## 2022-08-03 NOTE — Assessment & Plan Note (Signed)
Acute, R great toe; self limiting Continue tylenol and topical NSAIDs Colchicine called in to assist with gabapentin Hold NSAIDs d/t CHF Education provided; 1 wk f/u with PCP Potential cause from dehydration with lasix as well as poor heart function and known kidney impairment

## 2022-08-04 ENCOUNTER — Ambulatory Visit: Payer: Medicare HMO | Attending: Student | Admitting: Student

## 2022-08-04 ENCOUNTER — Encounter: Payer: Self-pay | Admitting: Student

## 2022-08-04 VITALS — BP 120/80 | HR 78 | Ht 67.0 in | Wt 223.0 lb

## 2022-08-04 DIAGNOSIS — Z950 Presence of cardiac pacemaker: Secondary | ICD-10-CM

## 2022-08-04 DIAGNOSIS — R0600 Dyspnea, unspecified: Secondary | ICD-10-CM | POA: Diagnosis not present

## 2022-08-04 DIAGNOSIS — I495 Sick sinus syndrome: Secondary | ICD-10-CM | POA: Diagnosis not present

## 2022-08-04 DIAGNOSIS — I4821 Permanent atrial fibrillation: Secondary | ICD-10-CM | POA: Diagnosis not present

## 2022-08-04 DIAGNOSIS — R0602 Shortness of breath: Secondary | ICD-10-CM | POA: Diagnosis not present

## 2022-08-04 LAB — CUP PACEART INCLINIC DEVICE CHECK
Date Time Interrogation Session: 20240321113855
Implantable Lead Connection Status: 753985
Implantable Lead Connection Status: 753985
Implantable Lead Implant Date: 20230612
Implantable Lead Implant Date: 20230612
Implantable Lead Location: 753859
Implantable Lead Location: 753860
Implantable Lead Model: 7841
Implantable Lead Model: 7842
Implantable Lead Serial Number: 1182863
Implantable Lead Serial Number: 1274488
Implantable Pulse Generator Implant Date: 20230612
Pulse Gen Serial Number: 111954

## 2022-08-04 NOTE — Patient Instructions (Signed)
Medication Instructions:  Your physician recommends that you continue on your current medications as directed. Please refer to the Current Medication list given to you today.  *If you need a refill on your cardiac medications before your next appointment, please call your pharmacy*   Lab Work: BMET, BNP today If you have labs (blood work) drawn today and your tests are completely normal, you will receive your results only by: [place] (if you have MyChart) OR A paper copy in the mail If you have any lab test that is abnormal or we need to change your treatment, we will call you to review the results.   Testing/Procedures: Your physician has requested that you have an echocardiogram. Echocardiography is a painless test that uses sound waves to create images of your heart. It provides your doctor with information about the size and shape of your heart and how well your heart's chambers and valves are working. This procedure takes approximately one hour. There are no restrictions for this procedure. Please do NOT wear cologne, perfume, aftershave, or lotions (deodorant is allowed). Please arrive 15 minutes prior to your appointment time.    Follow-Up: At [facility], you and your health needs are our priority.  As part of our continuing mission to provide you with exceptional heart care, we have created designated Provider Care Teams.  These Care Teams include your primary Cardiologist (physician) and Advanced Practice Providers (APPs -  Physician Assistants and Nurse Practitioners) who all work together to provide you with the care you need, when you need it.   Your next appointment:   2-3 month(s)  Provider:   Lars Mage, MD

## 2022-08-05 ENCOUNTER — Telehealth: Payer: Self-pay

## 2022-08-05 LAB — BASIC METABOLIC PANEL
BUN/Creatinine Ratio: 25 (ref 12–28)
BUN: 22 mg/dL (ref 8–27)
CO2: 24 mmol/L (ref 20–29)
Calcium: 9.4 mg/dL (ref 8.7–10.3)
Chloride: 107 mmol/L — ABNORMAL HIGH (ref 96–106)
Creatinine, Ser: 0.88 mg/dL (ref 0.57–1.00)
Glucose: 98 mg/dL (ref 70–99)
Potassium: 4.1 mmol/L (ref 3.5–5.2)
Sodium: 144 mmol/L (ref 134–144)
eGFR: 66 mL/min/{1.73_m2} (ref >59)

## 2022-08-05 LAB — PRO B NATRIURETIC PEPTIDE: NT-Pro BNP: 2017 pg/mL — ABNORMAL HIGH (ref 0–738)

## 2022-08-05 NOTE — Telephone Encounter (Signed)
Left message for return call for lab results. 

## 2022-08-08 ENCOUNTER — Ambulatory Visit: Payer: Self-pay | Admitting: *Deleted

## 2022-08-08 DIAGNOSIS — Z96652 Presence of left artificial knee joint: Secondary | ICD-10-CM | POA: Diagnosis not present

## 2022-08-08 DIAGNOSIS — Z96651 Presence of right artificial knee joint: Secondary | ICD-10-CM | POA: Diagnosis not present

## 2022-08-08 DIAGNOSIS — M8589 Other specified disorders of bone density and structure, multiple sites: Secondary | ICD-10-CM | POA: Diagnosis not present

## 2022-08-08 DIAGNOSIS — Z96642 Presence of left artificial hip joint: Secondary | ICD-10-CM | POA: Diagnosis not present

## 2022-08-08 DIAGNOSIS — Z96653 Presence of artificial knee joint, bilateral: Secondary | ICD-10-CM | POA: Diagnosis not present

## 2022-08-08 NOTE — Telephone Encounter (Signed)
  Chief Complaint: medication SE Symptoms: chronic diarrhea with colchicine  Frequency: diarrhea within 24 hours of starting medication   Disposition: [] ED /[] Urgent Care (no appt availability in office) / [] Appointment(In office/virtual)/ []  [facility]/ [] Home Care/ [] Refused Recommended Disposition /[] [facility]/ [x]  Follow-up with PCP Additional Notes: Patient states she has has 3 bouts of gout- she has always been treated with Prednisone- this was first try on colchicine. Patient states she has diarrhea that started with 24 hours. Her symptoms have improved- but she is unable to tolerate this medication and she is stopping. (Last dose- last night) Patient questions if she needs preventative or different option.   Reason for Disposition  [1] Caller has URGENT medicine question about med that PCP or specialist prescribed AND [2] triager unable to answer question  Answer Assessment - Initial Assessment Questions 1. NAME of MEDICINE: "What medicine(s) are you calling about?"     Colchicine 2. QUESTION: "What is your question?" (e.g., double dose of medicine, side effect)     SE- patient is taking and having stomach- diarrhea (7 episodes yesterday) 3. PRESCRIBER: "Who prescribed the medicine?" Reason: if prescribed by specialist, call should be referred to that group.     PCP- PA 4. SYMPTOMS: "Do you have any symptoms?" If Yes, ask: "What symptoms are you having?"  "How bad are the symptoms (e.g., mild, moderate, severe)     Patient is being treated for gout- she had never taken colchicine in past- patient started SE fairly soon after taking medication- patient took dose last night- she stopped today. Gout did improve- but she can not tolerate this medication.  Protocols used: Medication Question Call-A-AH

## 2022-08-08 NOTE — Telephone Encounter (Signed)
Summary: Pt reports that the colchicine 0.6 MG tablet is upsetting her stomach   Pt reports that the colchicine 0.6 MG tablet is upsetting her stomach and she would like to know what will happen if she stops taking it. Cb#[phone]       Called patient [phone] to review sx upset stomach and medication questions. No answer, LVMTCB [phone].

## 2022-08-09 NOTE — Telephone Encounter (Signed)
Call to patient- left provider message for patient on voice mail per patient permissions: Advised sorry that she experienced SE and hope she is doing better since she has stopped the medication. Please let provider know if gout pain is still present so she can move forward with plan for treatment.

## 2022-08-11 ENCOUNTER — Encounter: Payer: Self-pay | Admitting: Family Medicine

## 2022-08-11 ENCOUNTER — Ambulatory Visit (INDEPENDENT_AMBULATORY_CARE_PROVIDER_SITE_OTHER): Payer: Medicare HMO | Admitting: Family Medicine

## 2022-08-11 VITALS — BP 126/82 | HR 70 | Temp 98.0°F | Resp 20 | Wt 222.5 lb

## 2022-08-11 DIAGNOSIS — M545 Low back pain, unspecified: Secondary | ICD-10-CM

## 2022-08-11 DIAGNOSIS — G8929 Other chronic pain: Secondary | ICD-10-CM | POA: Diagnosis not present

## 2022-08-11 DIAGNOSIS — N182 Chronic kidney disease, stage 2 (mild): Secondary | ICD-10-CM | POA: Insufficient documentation

## 2022-08-11 DIAGNOSIS — M1A39X0 Chronic gout due to renal impairment, multiple sites, without tophus (tophi): Secondary | ICD-10-CM | POA: Diagnosis not present

## 2022-08-11 MED ORDER — PREDNISONE 20 MG PO TABS: 40.0000 mg | ORAL_TABLET | Freq: Every day | ORAL | 0 refills | Status: AC

## 2022-08-11 MED ORDER — ALLOPURINOL 100 MG PO TABS: 50.0000 mg | ORAL_TABLET | Freq: Every day | ORAL | 6 refills | Status: DC

## 2022-08-11 NOTE — Assessment & Plan Note (Addendum)
Chronic and uncontrolled.  Start on Allopurinol 50 mg daily. Patient advised that if she experiences a gout attack to contact the office and she will be started on a steroid for acute management.    Unable to cover with colchicine while initiating allopurinol in the setting of chronic anticoag and CHF/CKD Patient also advised that with temporary increase in lasix dose to increase water intake to avoid dehydration.  Obtain uric acid in two weeks and assess need for dosage change at that time.

## 2022-08-11 NOTE — Assessment & Plan Note (Addendum)
Chronic and improved on most recent lab values.  Continue current medications. Need to watch GFR when dosing medications, but in a good place right now Follow up in 2 months.

## 2022-08-11 NOTE — Progress Notes (Signed)
Established Patient Office Visit  Subjective   Patient ID: Lauren Lloyd, female    DOB: 08-29-1940  Age: 82 y.o. MRN: [id number]  Chief Complaint  Patient presents with   Gout    HPI  Presents to clinic for 1 week follow up for gout in her right great toe. She would also like to discuss her right wrist pain and back pain.   Gout  Stopped taking colchicine due to diarrhea. Resolved after stopping the medication.  The tophi on her toe has resolved. Pain is improved.  Is increasing her lasix dose for the next three days.  Worried about another flare up and is interested in starting allopurinol.    Right Wrist Pain Has had an increase from baseline pain in her right wrist with active motion.  Impacting daily activities, like opening jars and baking.  Notes that it feels like her gout.  Does not note any alleviating factors.   Back pain Has a 4 day garden tour scheduled that will involve a great amount of walking.  Is worried about her back pain worsening due to the increased activity and would like to discuss medication options to help manage pain during those days.        Review of Systems  Constitutional:  Negative for chills and fever.  Gastrointestinal:  Negative for abdominal pain, blood in stool, diarrhea, nausea and vomiting.  Genitourinary:  Negative for dysuria, hematuria and urgency.  Musculoskeletal:  Positive for joint pain.      Objective:     BP 126/82 (BP Location: Right Arm, Patient Position: Sitting, Cuff Size: Large)   Pulse 70   Temp 98 F (36.7 C) (Temporal)   Resp 20   Wt 222 lb 8 oz (100.9 kg)   SpO2 95%   BMI 34.85 kg/m    Physical Exam Constitutional:      Appearance: Normal appearance.  Abdominal:     General: Bowel sounds are normal.     Palpations: Abdomen is soft.  Musculoskeletal:     Right wrist: No deformity or tenderness. Normal range of motion.     Left wrist: Normal.     Right hand: No deformity. Decreased strength of  wrist extension.     Left hand: Normal.     Comments: Right wrist: Pain with extension and flexion.  Right hand: Decreased grip strength.   Feet:     Right foot:     Toenail Condition: Right toenails are normal.     Left foot:     Toenail Condition: Left toenails are normal.     Comments: Right great toe: No Swelling or erythema. Left great toe: No swelling or erythema. Skin:    General: Skin is warm and dry.  Neurological:     Mental Status: She is alert.      No results found for any visits on 08/11/22.    The ASCVD Risk score (Arnett DK, et al., 2019) failed to calculate for the following reasons:   The 2019 ASCVD risk score is only valid for ages 64 to 60   The patient has a prior MI or stroke diagnosis    Assessment & Plan:   Problem List Items Addressed This Visit       Genitourinary   Stage 2 chronic kidney disease    Chronic and improved on most recent lab values.  Continue current medications. Need to watch GFR when dosing medications, but in a good place right now Follow up in  2 months.         Other   Chronic low back pain (1ry area of Pain) (Bilateral) (R>L) w/o sciatica (Chronic)    Chronic.  Expected increase in activity and anticipated increase in pain. Start prednisone 40 mg daily, for 4 days while on garden tour.  Avoid NSAIDs given chronic anticoag and CHF/CKD      Relevant Medications   predniSONE (DELTASONE) 20 MG tablet   Chronic gout due to renal impairment of multiple sites without tophus - Primary    Chronic and uncontrolled.  Start on Allopurinol 50 mg daily. Patient advised that if she experiences a gout attack to contact the office and she will be started on a steroid for acute management.    Unable to cover with colchicine while initiating allopurinol in the setting of chronic anticoag and CHF/CKD Patient also advised that with temporary increase in lasix dose to increase water intake to avoid dehydration.  Obtain uric acid in two  weeks and assess need for dosage change at that time.      Relevant Orders   Uric acid    Return for as scheduled.    Everlene Balls, [place] [facility]  Patient seen along with MS3 student Everlene Balls. I personally evaluated this patient along with the student, and verified all aspects of the history, physical exam, and medical decision making as documented by the student. I agree with the student's documentation and have made all necessary edits.  Murice Barbar, Dionne Bucy, MD, MPH [facility]

## 2022-08-11 NOTE — Assessment & Plan Note (Addendum)
Chronic.  Expected increase in activity and anticipated increase in pain. Start prednisone 40 mg daily, for 4 days while on garden tour.  Avoid NSAIDs given chronic anticoag and CHF/CKD

## 2022-08-25 DIAGNOSIS — M1A39X0 Chronic gout due to renal impairment, multiple sites, without tophus (tophi): Secondary | ICD-10-CM | POA: Diagnosis not present

## 2022-08-26 ENCOUNTER — Other Ambulatory Visit: Payer: Self-pay

## 2022-08-26 DIAGNOSIS — M1A39X0 Chronic gout due to renal impairment, multiple sites, without tophus (tophi): Secondary | ICD-10-CM

## 2022-08-26 LAB — URIC ACID: Uric Acid: 8.4 mg/dL — ABNORMAL HIGH (ref 3.1–7.9)

## 2022-08-26 MED ORDER — ALLOPURINOL 100 MG PO TABS: 100.0000 mg | ORAL_TABLET | Freq: Every day | ORAL | 6 refills | Status: DC

## 2022-08-31 NOTE — Progress Notes (Signed)
Remote pacemaker transmission.   

## 2022-09-07 ENCOUNTER — Other Ambulatory Visit (HOSPITAL_COMMUNITY): Payer: Medicare HMO

## 2022-09-08 ENCOUNTER — Other Ambulatory Visit: Payer: Medicare HMO

## 2022-09-09 ENCOUNTER — Other Ambulatory Visit: Payer: Self-pay | Admitting: Family Medicine

## 2022-09-09 NOTE — Telephone Encounter (Signed)
Requested Prescriptions  Pending Prescriptions Disp Refills   gabapentin (NEURONTIN) 600 MG tablet [Pharmacy Med Name: GABAPENTIN 600 MG TAB] 90 tablet 0    Sig: TAKE ONE TABLET BY MOUTH AT BEDTIME     Neurology: Anticonvulsants - gabapentin Passed - 09/09/2022 10:31 AM      Passed - Cr in normal range and within 360 days    Creat  Date Value Ref Range Status  01/19/2017 0.78 0.60 - 0.93 mg/dL Final    Comment:    For patients >83 years of age, the reference limit for Creatinine is approximately 13% higher for people identified as African-American. .    Creatinine, Ser  Date Value Ref Range Status  08/04/2022 0.88 0.57 - 1.00 mg/dL Final         Passed - Completed PHQ-2 or PHQ-9 in the last 360 days      Passed - Valid encounter within last 12 months    Recent Outpatient Visits           4 weeks ago Chronic gout due to renal impairment of multiple sites without tophus   [facility] [place], Marzella Schlein, MD   1 month ago Acute gout due to renal impairment involving toe of right foot   [facility] Merita Norton T, FNP   3 months ago Pain of foot, unspecified laterality   [facility] [place], [place], PA-C   3 months ago Chronic diastolic CHF (congestive heart failure) [facility])   [place] [facility] [place], Marzella Schlein, MD   5 months ago Encounter for annual wellness visit (AWV) in Medicare patient    [facility] [place], Marzella Schlein, MD       Future Appointments             In 1 week Bacigalupo, Marzella Schlein, MD [facility]

## 2022-09-15 ENCOUNTER — Ambulatory Visit: Payer: Medicare HMO | Attending: Student

## 2022-09-15 DIAGNOSIS — R0600 Dyspnea, unspecified: Secondary | ICD-10-CM | POA: Diagnosis not present

## 2022-09-15 DIAGNOSIS — I4821 Permanent atrial fibrillation: Secondary | ICD-10-CM

## 2022-09-15 LAB — ECHOCARDIOGRAM COMPLETE
AR max vel: 1.57 cm2
AV Area VTI: 1.59 cm2
AV Area mean vel: 1.73 cm2
AV Mean grad: 5 mmHg
AV Peak grad: 8.3 mmHg
Ao pk vel: 1.44 m/s
Area-P 1/2: 5.66 cm2
Calc EF: 60.2 %
S' Lateral: 2.2 cm
Single Plane A2C EF: 51 %
Single Plane A4C EF: 66.1 %

## 2022-09-21 ENCOUNTER — Telehealth: Payer: Self-pay | Admitting: Cardiology

## 2022-09-21 DIAGNOSIS — R0600 Dyspnea, unspecified: Secondary | ICD-10-CM

## 2022-09-21 NOTE — Telephone Encounter (Signed)
Patient is calling back in regards to results. Requesting return call.

## 2022-09-21 NOTE — Telephone Encounter (Signed)
-----   Message from Graciella Freer, PA-C sent at 09/20/2022 11:14 AM EDT ----- Her Echocardiogram looks OK! Normal EF.    ? If her shortness of breath is more driven by her lungs. Please refer to pulmonology.

## 2022-09-21 NOTE — Telephone Encounter (Signed)
The patient has been notified of the result and verbalized understanding.  All questions (if any) were answered. Frutoso Schatz, RN 09/21/2022 10:07 AM   Referral has been placed.

## 2022-09-22 ENCOUNTER — Ambulatory Visit: Payer: Medicare HMO | Admitting: Family Medicine

## 2022-09-24 ENCOUNTER — Other Ambulatory Visit: Payer: Self-pay | Admitting: Cardiovascular Disease

## 2022-09-26 NOTE — Telephone Encounter (Signed)
Please schedule overdue F/U appointment with Dr. Gollan. Thank you! 

## 2022-09-26 NOTE — Telephone Encounter (Signed)
Left voicemail to schedule follow up appointment

## 2022-09-28 NOTE — Telephone Encounter (Signed)
Left voice mail to schedule appt

## 2022-09-29 ENCOUNTER — Encounter: Payer: Self-pay | Admitting: Cardiovascular Disease

## 2022-09-29 NOTE — Telephone Encounter (Signed)
Left voicemail to schedule follow up appointment

## 2022-09-29 NOTE — Telephone Encounter (Signed)
Unable to reach letter mailed to patient

## 2022-10-01 IMAGING — CR DG LUMBAR SPINE COMPLETE W/ BEND
1 series · 7 of 7 positions shown · non-contrast
Comparison: None.

CLINICAL DATA: Low back pain; Sacroiliac joint pain

EXAM:
LUMBAR SPINE - COMPLETE WITH BENDING VIEWS; BILATERAL SACROILIAC
JOINTS - 3+ VIEW

[Series 1: dg lumbar spine complete w/bend 6+v · 0.14mm/px · 7 of 7 slices shown]
[im 1/7]
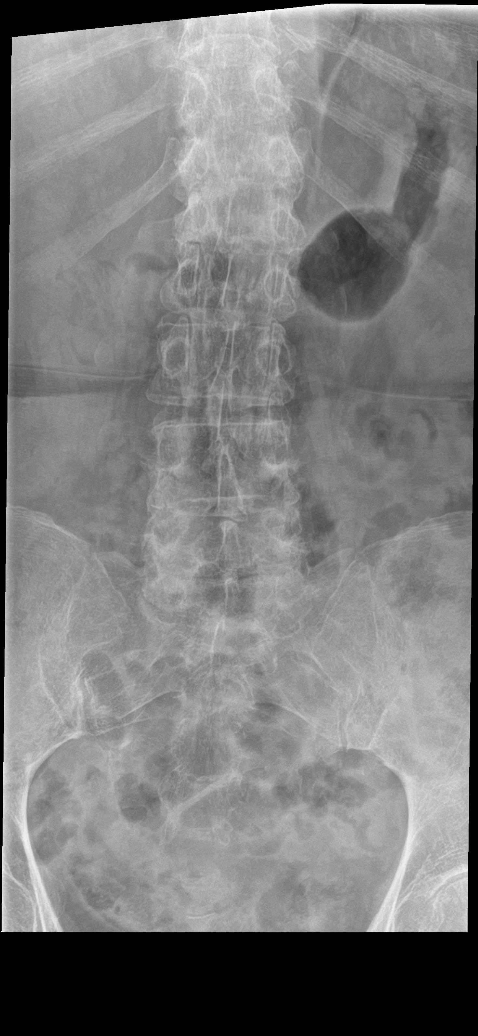
[im 2/7]
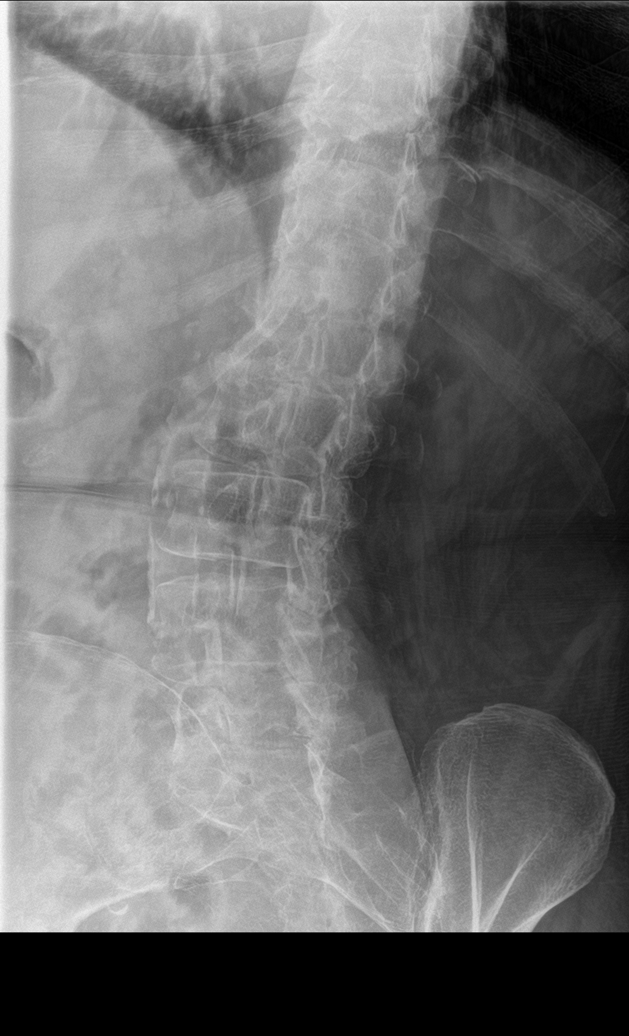
[im 3/7]
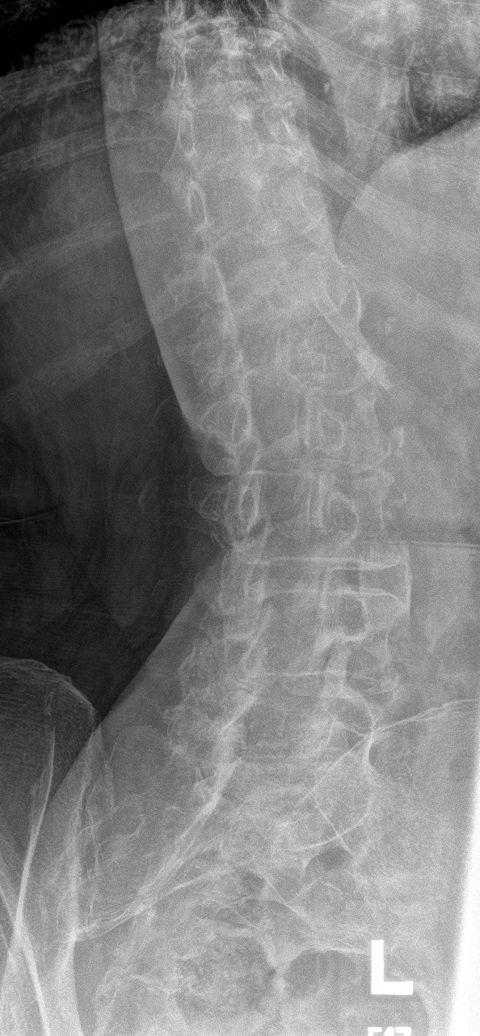
[im 4/7]
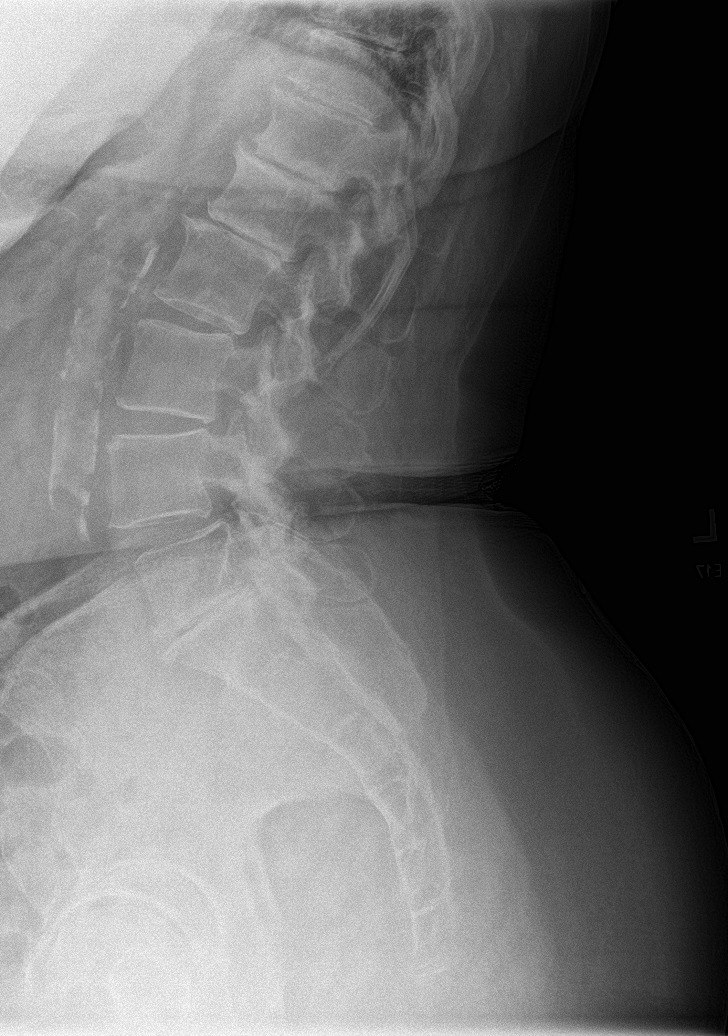
[im 5/7]
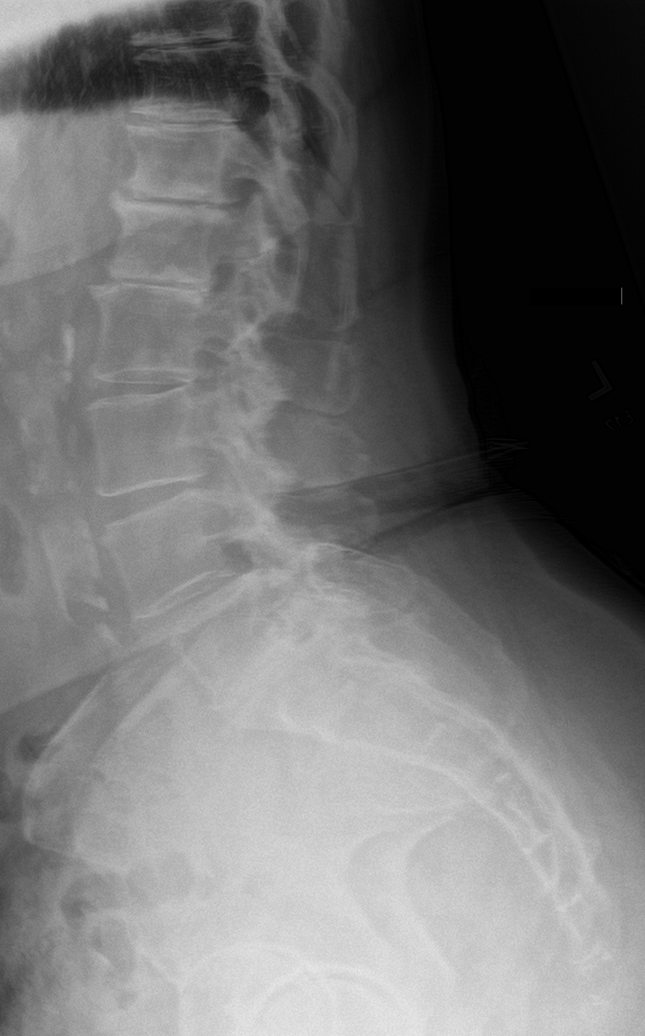
[im 6/7]
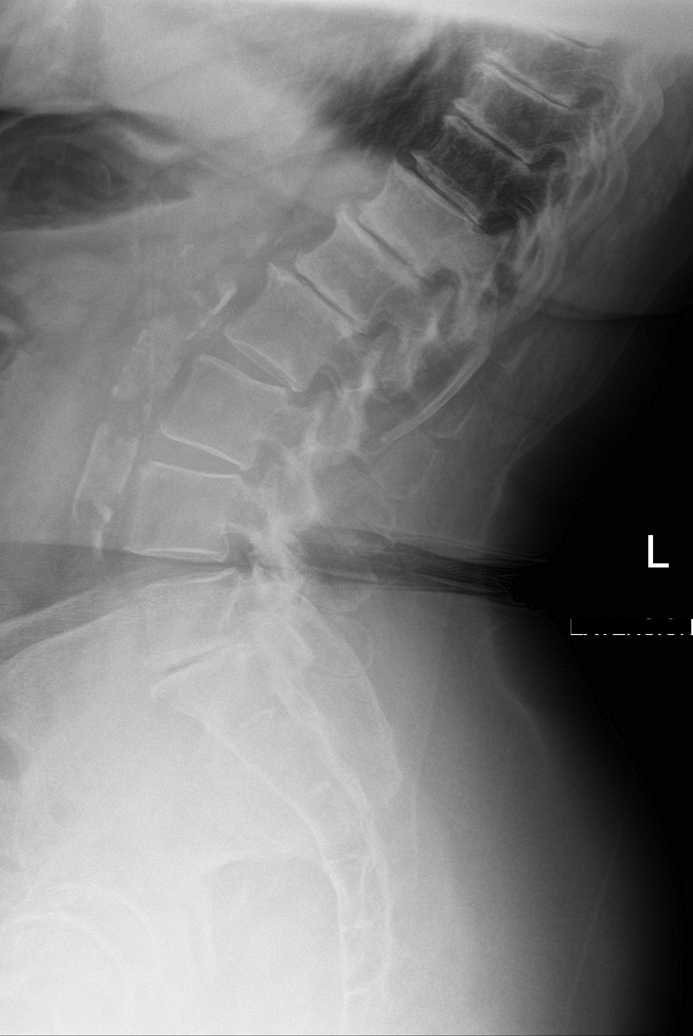
[im 7/7]
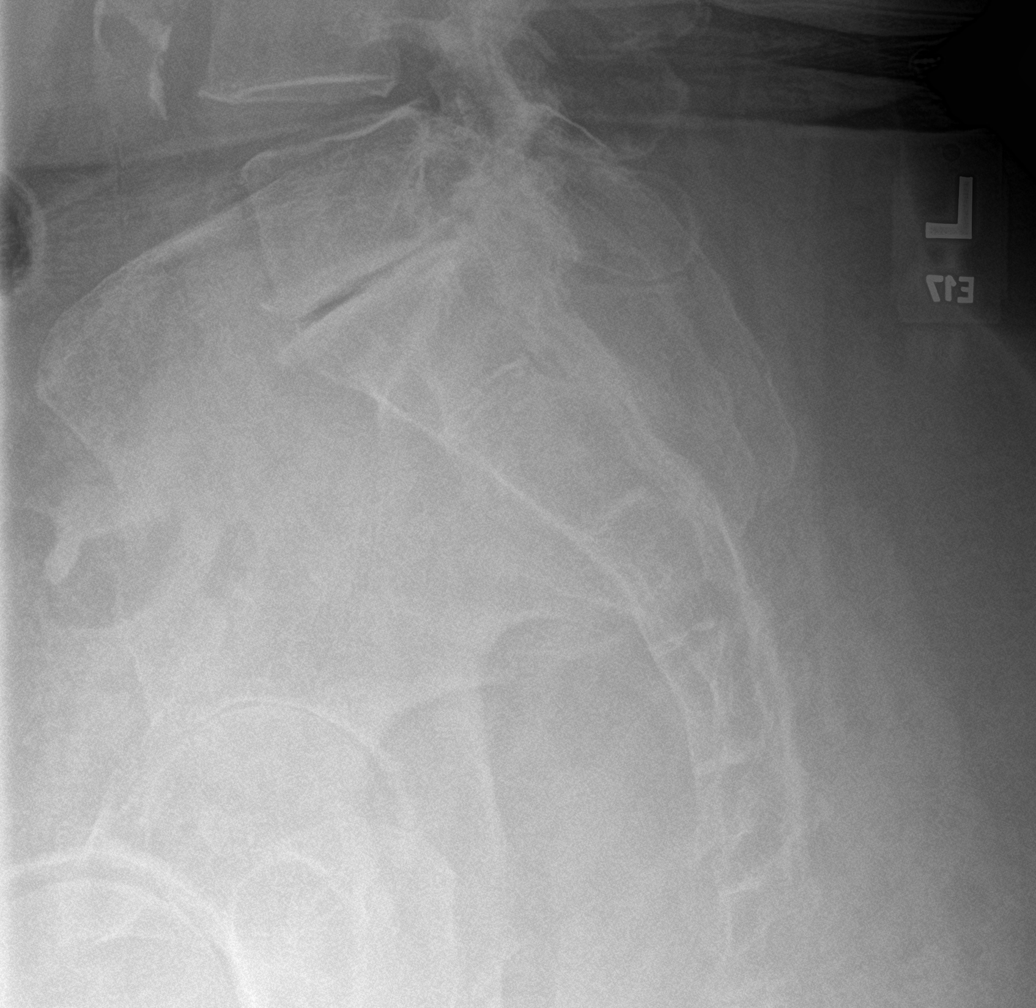

[7 of 7 positions shown; findings below may reference images not displayed]

FINDINGS: Lumbar spine:

There are 5 non-rib-bearing lumbar-type vertebral bodies. There is
trace stepwise retrolisthesis of the thoracolumbar junction
involving T12-L1, L1-L2, and L2-L3. There is trace anterolisthesis
of L4 on L5. These are fixed without dynamic instability on
flexion-extension views. Multilevel degenerative changes of the
thoracolumbar junction and lumbar spine, appear most advanced and
severe at T12-L1, L1-L2, and L5-S1. The lumbar vertebral body
heights are maintained without compression deformity. Generalized
osteopenia. Atherosclerotic calcifications of the abdominal aorta.

Sacroiliac joints:

No acute fracture. Generalized osteopenia. Mild degenerative changes
of the left SI joint. Moderate degenerative changes of the left hip
demonstrated by superior joint space loss.
IMPRESSION: Lumbar spine, sacroiliac joints:

1. Advanced multilevel degenerative changes of the thoracolumbar
junction and lumbar spine as described. Multilevel spondylolisthesis
without dynamic instability on flexion-extension views.
2. Mild degenerative changes of the left SI joint and moderate
degenerative changes of the left hip.

## 2022-10-03 ENCOUNTER — Encounter: Payer: Self-pay | Admitting: Family Medicine

## 2022-10-03 ENCOUNTER — Ambulatory Visit (INDEPENDENT_AMBULATORY_CARE_PROVIDER_SITE_OTHER): Payer: Medicare HMO | Admitting: Family Medicine

## 2022-10-03 VITALS — BP 114/74 | HR 71 | Ht 67.0 in | Wt 222.0 lb

## 2022-10-03 DIAGNOSIS — E7800 Pure hypercholesterolemia, unspecified: Secondary | ICD-10-CM

## 2022-10-03 DIAGNOSIS — R7303 Prediabetes: Secondary | ICD-10-CM

## 2022-10-03 DIAGNOSIS — N182 Chronic kidney disease, stage 2 (mild): Secondary | ICD-10-CM | POA: Diagnosis not present

## 2022-10-03 DIAGNOSIS — E669 Obesity, unspecified: Secondary | ICD-10-CM

## 2022-10-03 DIAGNOSIS — I10 Essential (primary) hypertension: Secondary | ICD-10-CM | POA: Diagnosis not present

## 2022-10-03 DIAGNOSIS — E559 Vitamin D deficiency, unspecified: Secondary | ICD-10-CM | POA: Diagnosis not present

## 2022-10-03 DIAGNOSIS — I4819 Other persistent atrial fibrillation: Secondary | ICD-10-CM | POA: Diagnosis not present

## 2022-10-03 DIAGNOSIS — M1811 Unilateral primary osteoarthritis of first carpometacarpal joint, right hand: Secondary | ICD-10-CM | POA: Diagnosis not present

## 2022-10-03 DIAGNOSIS — G4733 Obstructive sleep apnea (adult) (pediatric): Secondary | ICD-10-CM

## 2022-10-03 DIAGNOSIS — D6869 Other thrombophilia: Secondary | ICD-10-CM

## 2022-10-03 NOTE — Assessment & Plan Note (Signed)
Recommend low carb diet °Recheck A1c  °

## 2022-10-03 NOTE — Assessment & Plan Note (Signed)
F/b Cardiology Normal rate No changes to meds On Eliquis

## 2022-10-03 NOTE — Assessment & Plan Note (Signed)
Tolerating CPAP well at this time and improved symptoms Continue CPAP 

## 2022-10-03 NOTE — Assessment & Plan Note (Signed)
Well controlled Continue current medications Recheck metabolic panel F/u in 6 months  

## 2022-10-03 NOTE — Assessment & Plan Note (Signed)
Discussed importance of healthy weight management Discussed diet and exercise  

## 2022-10-03 NOTE — Assessment & Plan Note (Signed)
Afib.  On eliquis.  

## 2022-10-03 NOTE — Assessment & Plan Note (Signed)
Reviewed last lipid panel Not currently on a statin Continue zetia Recheck FLP and CMP Discussed diet and exercise  

## 2022-10-03 NOTE — Assessment & Plan Note (Signed)
Chronic and stable Recheck metabolic panel Avoid nephrotoxic meds 

## 2022-10-03 NOTE — Assessment & Plan Note (Signed)
Continue supplement Recheck level 

## 2022-10-03 NOTE — Progress Notes (Signed)
I,Sha'taria Tyson,acting as a Neurosurgeon for Shirlee Latch, MD.,have documented all relevant documentation on the behalf of Shirlee Latch, MD,as directed by  Shirlee Latch, MD while in the presence of Shirlee Latch, MD.   Established patient visit   Patient: Lauren Lloyd   DOB: 1940-06-12   82 y.o. Female  MRN: [id number] Visit Date: 10/03/2022  Today's healthcare provider: Shirlee Latch, MD   Chief Complaint  Patient presents with   Follow-up   Subjective    HPI  Hypertension, follow-up  BP Readings from Last 3 Encounters:  10/03/22 114/74  08/11/22 126/82  08/04/22 120/80   Wt Readings from Last 3 Encounters:  10/03/22 222 lb (100.7 kg)  08/11/22 222 lb 8 oz (100.9 kg)  08/04/22 223 lb (101.2 kg)     She was last seen for hypertension 6 months ago.  BP at that visit was normal. Management since that visit includes continue current treatment.  Outside blood pressures are not being checked. Pertinent labs Lab Results  Component Value Date   CHOL 162 03/17/2022   HDL 52 03/17/2022   LDLCALC 93 03/17/2022   TRIG 94 03/17/2022   CHOLHDL 3.1 03/17/2022   Lab Results  Component Value Date   NA 144 08/04/2022   K 4.1 08/04/2022   CREATININE 0.88 08/04/2022   EGFR 66 08/04/2022   GLUCOSE 98 08/04/2022   TSH 1.020 08/03/2020    ---------------------------------------------------------------------------------------------------  Lipid/Cholesterol, Follow-up  Last lipid panel Other pertinent labs  Lab Results  Component Value Date   CHOL 162 03/17/2022   HDL 52 03/17/2022   LDLCALC 93 03/17/2022   TRIG 94 03/17/2022   CHOLHDL 3.1 03/17/2022   Lab Results  Component Value Date   ALT 22 03/17/2022   AST 28 03/17/2022   PLT 222 07/05/2022   TSH 1.020 08/03/2020     She was last seen for this 6 months ago.  Management since that visit includes continue zetia.  She reports excellent compliance with treatment. She is not having side  effects.  Symptoms: No chest pain No chest pressure/discomfort  Yes dyspnea No lower extremity edema  No numbness or tingling of extremity Yes orthopnea  No palpitations No paroxysmal nocturnal dyspnea  No speech difficulty No syncope   Current diet: well balanced  The ASCVD Risk score (Arnett DK, et al., 2019) failed to calculate for the following reasons:   The 2019 ASCVD risk score is only valid for ages 26 to 7   The patient has a prior MI or stroke diagnosis ---------------------------------------------------------------------------------------------------   Ongoing pain in R thumb base Difficulty with opening jars and cooking  Medications: Outpatient Medications Prior to Visit  Medication Sig   acetaminophen (TYLENOL) 325 MG tablet Take 650 mg by mouth every 6 (six) hours as needed for moderate pain.   allopurinol (ZYLOPRIM) 100 MG tablet Take 1 tablet (100 mg total) by mouth daily.   benzonatate (TESSALON) 100 MG capsule Take 1 capsule (100 mg total) by mouth 2 (two) times daily as needed for cough.   bisoprolol (ZEBETA) 10 MG tablet Take 2 tablets (20 mg total) by mouth 2 (two) times daily.   buPROPion (WELLBUTRIN XL) 150 MG 24 hr tablet TAKE 1 TABLET BY MOUTH DAILY   calcium carbonate (TUMS EX) 750 MG chewable tablet Chew 2 tablets by mouth daily as needed for heartburn.   Cholecalciferol 25 MCG (1000 UT) tablet Take 1,000 Units by mouth daily.   colchicine 0.6 MG tablet Take 2 tablets  once, repeat with 1 tablet 12 hours following. Continue with 1 tablet every 12 hours until resolved. (Patient not taking: Reported on 08/11/2022)   ELIQUIS 5 MG TABS tablet TAKE ONE TABLET TWICE DAILY   ezetimibe (ZETIA) 10 MG tablet Take 1 tablet (10 mg total) by mouth daily.   furosemide (LASIX) 40 MG tablet TAKE 1 TABLET BY MOUTH DAILY. TAKE AN EXTRA TABLET AS NEEDED AFTER LUNCH FOR ABDOMINAL SWELLING, LEG SWELLING OR SHORTNESS OF BREATH   gabapentin (NEURONTIN) 600 MG tablet TAKE ONE  TABLET BY MOUTH AT BEDTIME   levalbuterol (XOPENEX HFA) 45 MCG/ACT inhaler Inhale 2 puffs into the lungs every 8 (eight) hours as needed for wheezing.   Menthol, Topical Analgesic, (BIOFREEZE EX) Apply 1 application. topically daily as needed (Neck pain).   metaxalone (SKELAXIN) 800 MG tablet Take 800 mg by mouth daily as needed for muscle spasms.   Multiple Vitamin (MULTIVITAMIN) capsule Take 1 capsule by mouth daily.   mupirocin ointment (BACTROBAN) 2 % Place 1 application  into the nose 2 (two) times daily.   naltrexone (DEPADE) 50 MG tablet TAKE 1/2 TABLET BY MOUTH DAILY   potassium chloride (KLOR-CON) 10 MEQ tablet TAKE 1 TABLET BY MOUTH DAILY   No facility-administered medications prior to visit.    Review of Systems per HPI     Objective    BP 114/74 (BP Location: Left Arm, Patient Position: Sitting, Cuff Size: Large)   Pulse 71   Ht 5\' 7"  (1.702 m)   Wt 222 lb (100.7 kg)   BMI 34.77 kg/m    Physical Exam Vitals reviewed.  Constitutional:      General: She is not in acute distress.    Appearance: Normal appearance. She is well-developed. She is not diaphoretic.  HENT:     Head: Normocephalic and atraumatic.  Eyes:     General: No scleral icterus.    Conjunctiva/sclera: Conjunctivae normal.  Neck:     Thyroid: No thyromegaly.  Cardiovascular:     Rate and Rhythm: Normal rate. Rhythm irregularly irregular.     Heart sounds: Normal heart sounds. No murmur heard. Pulmonary:     Effort: Pulmonary effort is normal. No respiratory distress.     Breath sounds: Normal breath sounds. No wheezing, rhonchi or rales.  Musculoskeletal:     Cervical back: Neck supple.     Right lower leg: No edema.     Left lower leg: No edema.  Lymphadenopathy:     Cervical: No cervical adenopathy.  Skin:    General: Skin is warm and dry.     Findings: No rash.  Neurological:     Mental Status: She is alert and oriented to person, place, and time. Mental status is at baseline.   Psychiatric:        Mood and Affect: Mood normal.        Behavior: Behavior normal.       No results found for any visits on 10/03/22.  Assessment & Plan     Problem List Items Addressed This Visit       Cardiovascular and Mediastinum   Essential hypertension - Primary    Well controlled Continue current medications Recheck metabolic panel F/u in 6 months       Relevant Orders   Comprehensive metabolic panel   Persistent atrial fibrillation (HCC)    F/b Cardiology Normal rate No changes to meds On Eliquis        Respiratory   OSA (obstructive sleep apnea)  Tolerating CPAP well at this time and improved symptoms Continue CPAP        Genitourinary   Stage 2 chronic kidney disease    Chronic and stable Recheck metabolic panel Avoid nephrotoxic meds       Relevant Orders   Comprehensive metabolic panel     Other   Hypercholesteremia    Reviewed last lipid panel Not currently on a statin Continue zetia Recheck FLP and CMP Discussed diet and exercise       Relevant Orders   Comprehensive metabolic panel   Lipid panel   Obesity (BMI 30-39.9)    Discussed importance of healthy weight management Discussed diet and exercise       Avitaminosis D    Continue supplement Recheck level       Relevant Orders   VITAMIN D 25 Hydroxy (Vit-D Deficiency, Fractures)   Secondary hypercoagulable state (HCC)    A fib On eliquis      Prediabetes    Recommend low carb diet Recheck A1c       Relevant Orders   Hemoglobin A1c   Other Visit Diagnoses     Arthritis of [facility]) joint of right thumb       Relevant Orders   Ambulatory referral to Orthopedics        Return in about 6 months (around 04/05/2023) for CPE, AWV.      I, Shirlee Latch, MD, have reviewed all documentation for this visit. The documentation on 10/03/22 for the exam, diagnosis, procedures, and orders are all accurate and complete.   Rawley Harju, Marzella Schlein, MD,  [facility]

## 2022-10-20 NOTE — Progress Notes (Signed)
  Electrophysiology Office Follow up Visit Note:    Date:  10/21/2022   ID:  Lauren Lloyd, DOB July 31, 1940, MRN [id number]  PCP:  Erasmo Downer, MD  [facility] Cardiologist:  Julien Nordmann, MD  [facility] Electrophysiologist:  Lanier Prude, MD    Interval History:    Lauren Lloyd is a 82 y.o. female who presents for a follow up visit.   The patient was last seen in clinic August 04, 2022.  She has a history of permanent atrial fibrillation with a dual-chamber permanent pacemaker in place.  The pacemaker was implanted October 25, 2021 and she had an AV nodal ablation on July 05, 2022.  She reports a slight improvement in her shortness of breath after the AV nodal ablation and permanent pacemaker.  She still feels winded with minimal exertion.  She has an appointment coming up with a pulmonologist.      Past medical, surgical, social and family history were reviewed.  ROS:   Please see the history of present illness.    All other systems reviewed and are negative.  EKGs/Labs/Other Studies Reviewed:    The following studies were reviewed today:  Sep 15, 2022 echo EF 60-65 RV normal Moderate MR Moderate TR  October 21, 2022 in clinic device interrogation personally reviewed Device functioning appropriately. Most accelerometer turned on Battery longevity 13 years Lead parameter stable    Physical Exam:    VS:  BP 138/76   Pulse (!) 1   Ht 5\' 7"  (1.702 m)   Wt 223 lb (101.2 kg)   SpO2 96%   BMI 34.93 kg/m     Wt Readings from Last 3 Encounters:  10/21/22 223 lb (101.2 kg)  10/03/22 222 lb (100.7 kg)  08/11/22 222 lb 8 oz (100.9 kg)     GEN:  Well nourished, well developed in no acute distress CARDIAC: RRR, no murmurs, rubs, gallops.  Pacemaker pocket well-healed RESPIRATORY:  Clear to auscultation without rales, wheezing or rhonchi       ASSESSMENT:    1. Tachycardia-bradycardia syndrome (HCC)   2. Cardiac pacemaker in situ   3. Chronic  diastolic CHF (congestive heart failure) (HCC)   4. Permanent atrial fibrillation (HCC)    PLAN:    In order of problems listed above:  #Tachybradycardia syndrome #Pacemaker in situ Device functioning appropriately.  Continue remote monitoring.  #Permanent atrial fibrillation On Eliquis for stroke prophylaxis.  Post AV nodal ablation.  #Chronic diastolic heart failure NYHA class II-III.  I suspect most of her symptoms related to primary lung problem rather than cardiac disease.  Last ejection fraction normal on recent echo.  Follow-up 1 year with APP.    Signed, Steffanie Dunn, MD, [facility], [facility] 10/21/2022 10:04 AM    Electrophysiology [facility]

## 2022-10-21 ENCOUNTER — Encounter: Payer: Self-pay | Admitting: Cardiology

## 2022-10-21 ENCOUNTER — Ambulatory Visit: Payer: Medicare HMO | Attending: Cardiology | Admitting: Cardiology

## 2022-10-21 VITALS — BP 138/76 | HR 1 | Ht 67.0 in | Wt 223.0 lb

## 2022-10-21 DIAGNOSIS — Z950 Presence of cardiac pacemaker: Secondary | ICD-10-CM

## 2022-10-21 DIAGNOSIS — I4821 Permanent atrial fibrillation: Secondary | ICD-10-CM | POA: Diagnosis not present

## 2022-10-21 DIAGNOSIS — I5032 Chronic diastolic (congestive) heart failure: Secondary | ICD-10-CM | POA: Diagnosis not present

## 2022-10-21 DIAGNOSIS — I495 Sick sinus syndrome: Secondary | ICD-10-CM

## 2022-10-21 LAB — CUP PACEART INCLINIC DEVICE CHECK
Date Time Interrogation Session: 20240607101444
Implantable Lead Connection Status: 753985
Implantable Lead Connection Status: 753985
Implantable Lead Implant Date: 20230612
Implantable Lead Implant Date: 20230612
Implantable Lead Location: 753859
Implantable Lead Location: 753860
Implantable Lead Model: 7841
Implantable Lead Model: 7842
Implantable Lead Serial Number: 1182863
Implantable Lead Serial Number: 1274488
Implantable Pulse Generator Implant Date: 20230612
Lead Channel Impedance Value: 555 Ohm
Lead Channel Impedance Value: 681 Ohm
Lead Channel Sensing Intrinsic Amplitude: 7.3 mV
Lead Channel Setting Pacing Amplitude: 1.2 V
Lead Channel Setting Pacing Pulse Width: 0.4 ms
Lead Channel Setting Sensing Sensitivity: 3.5 mV
Pulse Gen Serial Number: 111954
Zone Setting Status: 755011

## 2022-10-21 NOTE — Patient Instructions (Signed)
Medication Instructions:  Your physician recommends that you continue on your current medications as directed. Please refer to the Current Medication list given to you today.  *If you need a refill on your cardiac medications before your next appointment, please call your pharmacy*  Follow-Up: At [facility], you and your health needs are our priority.  As part of our continuing mission to provide you with exceptional heart care, we have created designated Provider Care Teams.  These Care Teams include your primary Cardiologist (physician) and Advanced Practice Providers (APPs -  Physician Assistants and Nurse Practitioners) who all work together to provide you with the care you need, when you need it.  Your next appointment:   1 year(s)  Provider:   You will see one of the following Advanced Practice Providers on your designated Care Team:   Renee Ursuy, PA-C Michael "Andy" Tillery, PA-C Suzann Riddle, NP  

## 2022-10-26 ENCOUNTER — Ambulatory Visit: Payer: Medicare HMO | Admitting: Pulmonary Disease

## 2022-10-26 ENCOUNTER — Ambulatory Visit (INDEPENDENT_AMBULATORY_CARE_PROVIDER_SITE_OTHER): Payer: Medicare HMO

## 2022-10-26 ENCOUNTER — Encounter: Payer: Self-pay | Admitting: Pulmonary Disease

## 2022-10-26 VITALS — BP 112/68 | HR 75 | Temp 97.5°F | Ht 67.0 in | Wt 223.4 lb

## 2022-10-26 DIAGNOSIS — I495 Sick sinus syndrome: Secondary | ICD-10-CM | POA: Diagnosis not present

## 2022-10-26 DIAGNOSIS — R0609 Other forms of dyspnea: Secondary | ICD-10-CM | POA: Diagnosis not present

## 2022-10-26 LAB — CUP PACEART REMOTE DEVICE CHECK
Battery Remaining Longevity: 144 mo
Battery Remaining Percentage: 100 %
Brady Statistic RA Percent Paced: 0 %
Brady Statistic RV Percent Paced: 99 %
Date Time Interrogation Session: 20240612041100
Implantable Lead Connection Status: 753985
Implantable Lead Connection Status: 753985
Implantable Lead Implant Date: 20230612
Implantable Lead Implant Date: 20230612
Implantable Lead Location: 753859
Implantable Lead Location: 753860
Implantable Lead Model: 7841
Implantable Lead Model: 7842
Implantable Lead Serial Number: 1182863
Implantable Lead Serial Number: 1274488
Implantable Pulse Generator Implant Date: 20230612
Lead Channel Impedance Value: 521 Ohm
Lead Channel Impedance Value: 651 Ohm
Lead Channel Pacing Threshold Amplitude: 0.8 V
Lead Channel Pacing Threshold Pulse Width: 0.4 ms
Lead Channel Setting Pacing Amplitude: 1.2 V
Lead Channel Setting Pacing Pulse Width: 0.4 ms
Lead Channel Setting Sensing Sensitivity: 3.5 mV
Pulse Gen Serial Number: 111954
Zone Setting Status: 755011

## 2022-10-26 MED ORDER — STIOLTO RESPIMAT 2.5-2.5 MCG/ACT IN AERS: 2.0000 | INHALATION_SPRAY | Freq: Every day | RESPIRATORY_TRACT | 0 refills | Status: DC

## 2022-10-26 NOTE — Patient Instructions (Addendum)
Nice to meet you  Try Stiolto 2 puffs in the morning, once a day  You can use the lev albuterol rescue inhaler you already have as needed as well in addition to this exercise and shortness of breath  I have ordered pulmonary function test for further evaluation of your shortness of breath  Return to clinic in 2 months or sooner as needed with Dr. Judeth Horn after PFT

## 2022-10-26 NOTE — Progress Notes (Signed)
@Patient  ID: Lauren Lloyd, female    DOB: 1940-07-30, 82 y.o.   MRN: [id number]  Chief Complaint  Patient presents with   Pulmonary Consult    Referred by Floydene Flock, PA. Pt c/o SOB over the past 2 days. She gets winded walking up any incline or short distance on flat surface. She also c/o cough with large amounts of light yellow sputum.     Referring provider: Graciella Freer*  HPI:   82 y.o. woman whom we are seeing for evaluation of dyspnea exertion.  Most recent cardiology note x 3 reviewed.  Patient notes dyspnea for some time now months to years.  Worse on inclines or stairs.  Present on flat surfaces well but not as quickly.  No time of day when things are better or worse.  Improves with rest but otherwise no position to make things better or worse.  No seasonal or environmental factors she can identify to make things better or worse.  No alleviating or otherwise exacerbating factors noted.  She thinks a lot of this started when she went to atrial fibrillation in the past.  This when she first notes in her timeline issues with dyspnea.  She does have a cough.  Brings up light yellow sputum.  Reviewed serial echocardiograms in the past revealed diastolic dysfunction, and chronically elevated estimated PASP similar in readings over the last 3-4 dating back to 2016 at the most remote TTE that I can review results of.  Her most recent chest imaging, cross-sectional imaging, 02/2021 reveals clear lungs with some mild bronchial thickening in the bases on my review and interpretation.  Questionaires / Pulmonary Flowsheets:   ACT:      No data to display          MMRC:     No data to display          Epworth:     12/14/2016    9:00 AM  Results of the Epworth flowsheet  Sitting and reading 1  Watching TV 1  Sitting, inactive in a public place (e.g. a theatre or a meeting) 0  As a passenger in a car for an hour without a break 1  Lying down to rest in the  afternoon when circumstances permit 1  Sitting and talking to someone 0  Sitting quietly after a lunch without alcohol 0  In a car, while stopped for a few minutes in traffic 0  Total score 4    Tests:   FENO:  No results found for: "NITRICOXIDE"  PFT:     No data to display          WALK:      No data to display          Imaging: CUP PACEART REMOTE DEVICE CHECK  Result Date: 10/26/2022 Scheduled remote reviewed. Normal device function.  Next remote 91 days. LA, CVRS  CUP PACEART INCLINIC DEVICE CHECK  Result Date: 10/21/2022 Pacemaker check in clinic. Normal device function. Threshold, sensing, impedances consistent with previous measurements. Device programmed to maximize longevity. No high ventricular rates noted. Device programmed at appropriate safety margins. Histogram distribution flat for patient activity level. Device programmed to optimize intrinsic conduction. Estimated longevity 13 years. Patient enrolled in remote follow-up. Patient education completed. Minute vent. response factor increased from 8 to 9. Accelerometer turned on at nominal values.Raj Janus, RN   Lab Results:  CBC    Component Value Date/Time   WBC 8.5 07/05/2022 1222  RBC 4.75 07/05/2022 1222   HGB 14.6 07/05/2022 1222   HGB 14.5 11/04/2021 1118   HCT 44.4 07/05/2022 1222   HCT 41.7 11/04/2021 1118   PLT 222 07/05/2022 1222   PLT 298 11/04/2021 1118   MCV 93.5 07/05/2022 1222   MCV 90 11/04/2021 1118   MCH 30.7 07/05/2022 1222   MCHC 32.9 07/05/2022 1222   RDW 14.2 07/05/2022 1222   RDW 12.7 11/04/2021 1118   LYMPHSABS 1.3 05/10/2022 1255   LYMPHSABS 2.3 08/03/2020 1012   MONOABS 1.3 (H) 05/10/2022 1255   EOSABS 0.2 05/10/2022 1255   EOSABS 0.2 08/03/2020 1012   BASOSABS 0.1 05/10/2022 1255   BASOSABS 0.1 08/03/2020 1012    BMET    Component Value Date/Time   NA 144 08/04/2022 1107   K 4.1 08/04/2022 1107   CL 107 (H) 08/04/2022 1107   CO2 24 08/04/2022 1107    GLUCOSE 98 08/04/2022 1107   GLUCOSE 90 05/25/2022 1005   BUN 22 08/04/2022 1107   CREATININE 0.88 08/04/2022 1107   CREATININE 0.78 01/19/2017 1522   CALCIUM 9.4 08/04/2022 1107   GFRNONAA 44 (L) 05/25/2022 1005   GFRNONAA 73 01/19/2017 1522   GFRAA 75 12/09/2019 1411   GFRAA 85 01/19/2017 1522    BNP    Component Value Date/Time   BNP 746.2 (H) 05/12/2022 0453    ProBNP    Component Value Date/Time   PROBNP 2,017 (H) 08/04/2022 1107    Specialty Problems       Pulmonary Problems   OSA (obstructive sleep apnea)   Centrilobular emphysema (HCC)   Dyspnea on exertion    Allergies  Allergen Reactions   Levofloxacin Other (See Comments)    Other reaction(s): Joint Pains    Influenza Vaccine Recombinant Other (See Comments)   Influenza Vaccines Other (See Comments)    Bell's Palsy   Oysters [Shellfish Allergy] Swelling    She states she had eaten them three days in a row and she developed swelling around her eyes.     Immunization History  Administered Date(s) Administered   Hepatitis A 11/25/1999, 09/19/2001   Hepatitis A, Adult 11/25/1999, 09/19/2001   IPV 02/11/2000   PFIZER(Purple Top)SARS-COV-2 Vaccination 06/14/2019, 07/05/2019, 04/15/2020   Pneumococcal Conjugate-13 10/06/2014   Pneumococcal Polysaccharide-23 12/01/2010   Td 02/02/1998   Tdap 09/11/2007, 02/27/2012   Typhoid Inactivated 02/11/2000   Zoster Recombinat (Shingrix) 02/06/2020, 05/29/2020    Past Medical History:  Diagnosis Date   (HFpEF) heart failure with preserved ejection fraction (HCC)    a. 05/2018 Echo: EF 55-60%, no rwma, mild to mod MR. Nl RV fxn. Mod TR. PASP .   Arthritis    knees, Hands   Arthritis of knee    Back pain    Carotid arterial disease (HCC)    a. 03/2019 Carotid U/S: <50% bilat ICA stenoses.   Cholelithiasis    a. 10/2018 noted on CT.   Edema, lower extremity    Fatty liver    GERD (gastroesophageal reflux disease)    History of stress test    a.  06/2018 MV: EF 59%, no ischemia/infarct. Low risk.   Knee pain    Lactose intolerance    Mitral regurgitation    a. 05/2018 Echo: mild to mod MR.   Multinodular goiter    Obesity    OSA (obstructive sleep apnea)    PAF (paroxysmal atrial fibrillation) (HCC)    a.  Diagnosed 12/19; b. 05/2018 s/p DCCV; c. 03/2019 & 05/2019 recurrent  AFib-->managed w/ amio load; d. CHADS2VASc = 6 (CHF, HTN, age x 2, vascular disease, female)-->Eliquis & amio 100 qd.   PAH (pulmonary artery hypertension) (HCC)    RSV (acute bronchiolitis due to respiratory syncytial virus) 05/10/2022   Scoliosis    SOB (shortness of breath)    Swallowing difficulty     Tobacco History: Social History   Tobacco Use  Smoking Status Former   Packs/day: 1.00   Years: 30.00   Additional pack years: 0.00   Total pack years: 30.00   Types: Cigarettes   Quit date: 05/16/1989   Years since quitting: 33.4   Passive exposure: Past  Smokeless Tobacco Never   Counseling given: Not Answered   Continue to not smoke  Outpatient Encounter Medications as of 10/26/2022  Medication Sig   acetaminophen (TYLENOL) 325 MG tablet Take 650 mg by mouth every 6 (six) hours as needed for moderate pain.   allopurinol (ZYLOPRIM) 100 MG tablet Take 1 tablet (100 mg total) by mouth daily.   benzonatate (TESSALON) 100 MG capsule Take 1 capsule (100 mg total) by mouth 2 (two) times daily as needed for cough.   bisoprolol (ZEBETA) 10 MG tablet Take 2 tablets (20 mg total) by mouth 2 (two) times daily.   calcium carbonate (TUMS EX) 750 MG chewable tablet Chew 2 tablets by mouth daily as needed for heartburn.   Cholecalciferol 25 MCG (1000 UT) tablet Take 1,000 Units by mouth daily.   colchicine 0.6 MG tablet Take 2 tablets once, repeat with 1 tablet 12 hours following. Continue with 1 tablet every 12 hours until resolved.   ELIQUIS 5 MG TABS tablet TAKE ONE TABLET TWICE DAILY   ezetimibe (ZETIA) 10 MG tablet Take 1 tablet (10 mg total) by mouth  daily.   furosemide (LASIX) 40 MG tablet TAKE 1 TABLET BY MOUTH DAILY. TAKE AN EXTRA TABLET AS NEEDED AFTER LUNCH FOR ABDOMINAL SWELLING, LEG SWELLING OR SHORTNESS OF BREATH   gabapentin (NEURONTIN) 600 MG tablet TAKE ONE TABLET BY MOUTH AT BEDTIME   levalbuterol (XOPENEX HFA) 45 MCG/ACT inhaler Inhale 2 puffs into the lungs every 8 (eight) hours as needed for wheezing.   Menthol, Topical Analgesic, (BIOFREEZE EX) Apply 1 application. topically daily as needed (Neck pain).   metaxalone (SKELAXIN) 800 MG tablet Take 800 mg by mouth daily as needed for muscle spasms.   Multiple Vitamin (MULTIVITAMIN) capsule Take 1 capsule by mouth daily.   mupirocin ointment (BACTROBAN) 2 % Place 1 application  into the nose 2 (two) times daily.   naltrexone (DEPADE) 50 MG tablet TAKE 1/2 TABLET BY MOUTH DAILY   potassium chloride (KLOR-CON) 10 MEQ tablet TAKE 1 TABLET BY MOUTH DAILY   Tiotropium Bromide-Olodaterol (STIOLTO RESPIMAT) 2.5-2.5 MCG/ACT AERS Inhale 2 puffs into the lungs daily.   [DISCONTINUED] buPROPion (WELLBUTRIN XL) 150 MG 24 hr tablet TAKE 1 TABLET BY MOUTH DAILY   No facility-administered encounter medications on file as of 10/26/2022.     Review of Systems  Review of Systems  No chest pain with exertion.  No orthopnea or PND.  Comprehensive review of systems otherwise negative. Physical Exam  BP 112/68 (Cuff Size: Normal)   Pulse 75   Temp (!) 97.5 F (36.4 C) (Oral)   Ht 5\' 7"  (1.702 m)   Wt 223 lb 6.4 oz (101.3 kg)   SpO2 96% Comment: on RA  BMI 34.99 kg/m   Wt Readings from Last 5 Encounters:  10/26/22 223 lb 6.4 oz (101.3 kg)  10/21/22  223 lb (101.2 kg)  10/03/22 222 lb (100.7 kg)  08/11/22 222 lb 8 oz (100.9 kg)  08/04/22 223 lb (101.2 kg)    BMI Readings from Last 5 Encounters:  10/26/22 34.99 kg/m  10/21/22 34.93 kg/m  10/03/22 34.77 kg/m  08/11/22 34.85 kg/m  08/04/22 34.93 kg/m     Physical Exam General: Sitting in chair, no acute distress Eyes:  EOMI, no icterus Neck: Supple, no JVP Pulmonary: Clear, normal work of breathing Cardiovascular: Regular rate and rhythm, no murmur Abdomen: Nondistended, bowel sounds present MSK: No synovitis, no joint effusion Neuro: Normal gait, no weakness Psych: Normal mood, full affect   Assessment & Plan:   Dyspnea on exertion: Suspect this is multifactorial.  Review of multiple pacemaker checks indicates she is V paced 100% of the time with threshold of 70 bpm.  Do wonder if her heart rate is unable to go above this due to tachycardia bradycardia now permanent bradycardia syndrome and as such she does not have the compensatory increase in cardiac output related to inability to increase heart rate.  She has history of elevated PASP dating back to 2016, likely group 2 in nature given her A-fib, diastolic dysfunction etc.  Overall stable over time but certainly pulmonary hypertension can cause dyspnea.  She has some mild bronchial wall thickening on CT scan and some cough which could certainly implicate possible asthma.  PFTs for further evaluation.  Asthma versus chronic bronchitis: Clinical diagnosis based on bronchial wall thickening as well as cigarette smoking history.  Trial Stiolto to see if improves dyspnea as well as cough.  Consider escalation, addition of ICS, in the future mild improvement but not satisfactorily so.   Return in about 2 months (around 12/26/2022).   Karren Burly, MD 10/27/2022   This appointment required 62 minutes of patient care (this includes precharting, chart review, review of results, face-to-face care, etc.).

## 2022-10-28 DIAGNOSIS — I10 Essential (primary) hypertension: Secondary | ICD-10-CM | POA: Diagnosis not present

## 2022-10-28 DIAGNOSIS — E7800 Pure hypercholesterolemia, unspecified: Secondary | ICD-10-CM | POA: Diagnosis not present

## 2022-10-28 DIAGNOSIS — R7303 Prediabetes: Secondary | ICD-10-CM | POA: Diagnosis not present

## 2022-10-28 DIAGNOSIS — E559 Vitamin D deficiency, unspecified: Secondary | ICD-10-CM | POA: Diagnosis not present

## 2022-10-28 DIAGNOSIS — N182 Chronic kidney disease, stage 2 (mild): Secondary | ICD-10-CM | POA: Diagnosis not present

## 2022-10-29 LAB — COMPREHENSIVE METABOLIC PANEL
ALT: 14 IU/L (ref 0–32)
AST: 23 IU/L (ref 0–40)
Albumin/Globulin Ratio: 1.6
Albumin: 4.1 g/dL (ref 3.7–4.7)
Alkaline Phosphatase: 126 IU/L — ABNORMAL HIGH (ref 44–121)
BUN/Creatinine Ratio: 21 (ref 12–28)
BUN: 22 mg/dL (ref 8–27)
Bilirubin Total: 0.6 mg/dL (ref 0.0–1.2)
CO2: 24 mmol/L (ref 20–29)
Calcium: 9.7 mg/dL (ref 8.7–10.3)
Chloride: 105 mmol/L (ref 96–106)
Creatinine, Ser: 1.06 mg/dL — ABNORMAL HIGH (ref 0.57–1.00)
Globulin, Total: 2.5 g/dL (ref 1.5–4.5)
Glucose: 111 mg/dL — ABNORMAL HIGH (ref 70–99)
Potassium: 4.5 mmol/L (ref 3.5–5.2)
Sodium: 142 mmol/L (ref 134–144)
Total Protein: 6.6 g/dL (ref 6.0–8.5)
eGFR: 52 mL/min/{1.73_m2} — ABNORMAL LOW (ref >59)

## 2022-10-29 LAB — HEMOGLOBIN A1C
Est. average glucose Bld gHb Est-mCnc: 126 mg/dL
Hgb A1c MFr Bld: 6 % — ABNORMAL HIGH (ref 4.8–5.6)

## 2022-10-29 LAB — LIPID PANEL
Chol/HDL Ratio: 3.3 ratio (ref 0.0–4.4)
Cholesterol, Total: 176 mg/dL (ref 100–199)
HDL: 54 mg/dL (ref >39)
LDL Chol Calc (NIH): 100 mg/dL — ABNORMAL HIGH (ref 0–99)
Triglycerides: 126 mg/dL (ref 0–149)
VLDL Cholesterol Cal: 22 mg/dL (ref 5–40)

## 2022-10-29 LAB — VITAMIN D 25 HYDROXY (VIT D DEFICIENCY, FRACTURES): Vit D, 25-Hydroxy: 53.9 ng/mL (ref 30.0–100.0)

## 2022-11-02 ENCOUNTER — Other Ambulatory Visit: Payer: Self-pay | Admitting: Family Medicine

## 2022-11-02 NOTE — Telephone Encounter (Signed)
Requested medication (s) are due for refill today - expired Rx  Requested medication (s) are on the active medication list -yes  Future visit scheduled -no  Last refill: 08/16/21 #90 3RF  Notes to clinic: expired Rx  Requested Prescriptions  Pending Prescriptions Disp Refills   ezetimibe (ZETIA) 10 MG tablet [Pharmacy Med Name: EZETIMIBE 10 MG TAB] 90 tablet 3    Sig: TAKE 1 TABLET BY MOUTH DAILY     Cardiovascular:  Antilipid - Sterol Transport Inhibitors Failed - 11/02/2022  9:25 AM      Failed - Lipid Panel in normal range within the last 12 months    Cholesterol, Total  Date Value Ref Range Status  10/28/2022 176 100 - 199 mg/dL Final   LDL Chol Calc (NIH)  Date Value Ref Range Status  10/28/2022 100 (H) 0 - 99 mg/dL Final   HDL  Date Value Ref Range Status  10/28/2022 54 >39 mg/dL Final   Triglycerides  Date Value Ref Range Status  10/28/2022 126 0 - 149 mg/dL Final         Passed - AST in normal range and within 360 days    AST  Date Value Ref Range Status  10/28/2022 23 0 - 40 IU/L Final         Passed - ALT in normal range and within 360 days    ALT  Date Value Ref Range Status  10/28/2022 14 0 - 32 IU/L Final         Passed - Patient is not pregnant      Passed - Valid encounter within last 12 months    Recent Outpatient Visits           1 month ago Essential hypertension   [place] [facility] [place], Marzella Schlein, MD   2 months ago Chronic gout due to renal impairment of multiple sites without tophus   [place] [facility] [place], Marzella Schlein, MD   3 months ago Acute gout due to renal impairment involving toe of right foot   [facility] Merita Norton T, FNP   4 months ago Pain of foot, unspecified laterality   [place] [facility] Anton Ruiz, [place], PA-C   5 months ago Chronic diastolic CHF (congestive heart failure) [facility])   [place] [facility]  [place], Marzella Schlein, MD       Future Appointments             In 2 months Gollan, Tollie Pizza, MD [place] [facility] [place]   In 2 months Hunsucker, Lesia Sago, MD [place] [facility]               Requested Prescriptions  Pending Prescriptions Disp Refills   ezetimibe (ZETIA) 10 MG tablet [Pharmacy Med Name: EZETIMIBE 10 MG TAB] 90 tablet 3    Sig: TAKE 1 TABLET BY MOUTH DAILY     Cardiovascular:  Antilipid - Sterol Transport Inhibitors Failed - 11/02/2022  9:25 AM      Failed - Lipid Panel in normal range within the last 12 months    Cholesterol, Total  Date Value Ref Range Status  10/28/2022 176 100 - 199 mg/dL Final   LDL Chol Calc (NIH)  Date Value Ref Range Status  10/28/2022 100 (H) 0 - 99 mg/dL Final   HDL  Date Value Ref Range Status  10/28/2022 54 >39 mg/dL Final   Triglycerides  Date Value  Ref Range Status  10/28/2022 126 0 - 149 mg/dL Final         Passed - AST in normal range and within 360 days    AST  Date Value Ref Range Status  10/28/2022 23 0 - 40 IU/L Final         Passed - ALT in normal range and within 360 days    ALT  Date Value Ref Range Status  10/28/2022 14 0 - 32 IU/L Final         Passed - Patient is not pregnant      Passed - Valid encounter within last 12 months    Recent Outpatient Visits           1 month ago Essential hypertension   [place] [place], Marzella Schlein, MD   2 months ago Chronic gout due to renal impairment of multiple sites without tophus   [place] [facility] [place], Marzella Schlein, MD   3 months ago Acute gout due to renal impairment involving toe of right foot   [facility] Merita Norton T, FNP   4 months ago Pain of foot, unspecified laterality   [place] [facility] [place], [place], PA-C   5 months ago Chronic diastolic CHF (congestive heart failure) [facility])   [place]  [facility] [place], Marzella Schlein, MD       Future Appointments             In 2 months Gollan, Tollie Pizza, MD [place] [facility] [place]   In 2 months Hunsucker, Lesia Sago, MD [facility] [place] [facility]

## 2022-11-22 NOTE — Progress Notes (Signed)
Remote pacemaker transmission.   

## 2022-11-25 ENCOUNTER — Other Ambulatory Visit: Payer: Self-pay | Admitting: Family Medicine

## 2022-11-25 ENCOUNTER — Telehealth: Payer: Self-pay | Admitting: Cardiology

## 2022-11-25 NOTE — Telephone Encounter (Signed)
Pt c/o medication issue:  1. Name of Medication:   bisoprolol (ZEBETA) 10 MG tablet    2. How are you currently taking this medication (dosage and times per day)?   Take 2 tablets (20 mg total) by mouth 2 (two) times daily.    3. Are you having a reaction (difficulty breathing--STAT)? No  4. What is your medication issue? Pt states that she would like to take 1 tablet instead of 2 tablets twice daily. Please advise.

## 2022-11-25 NOTE — Telephone Encounter (Signed)
Left message for patient to call back  

## 2022-11-28 NOTE — Telephone Encounter (Signed)
Requested Prescriptions  Pending Prescriptions Disp Refills   bisoprolol (ZEBETA) 10 MG tablet [Pharmacy Med Name: BISOPROLOL FUMARATE 10 MG TAB] 360 tablet 1    Sig: TAKE TWO TABLETS BY MOUTH TWICE DAILY     Cardiovascular: Beta Blockers 2 Failed - 11/25/2022  2:51 PM      Failed - Cr in normal range and within 360 days    Creat  Date Value Ref Range Status  01/19/2017 0.78 0.60 - 0.93 mg/dL Final    Comment:    For patients >83 years of age, the reference limit for Creatinine is approximately 13% higher for people identified as African-American. .    Creatinine, Ser  Date Value Ref Range Status  10/28/2022 1.06 (H) 0.57 - 1.00 mg/dL Final         Passed - Last BP in normal range    BP Readings from Last 1 Encounters:  10/26/22 112/68         Passed - Last Heart Rate in normal range    Pulse Readings from Last 1 Encounters:  10/26/22 75         Passed - Valid encounter within last 6 months    Recent Outpatient Visits           1 month ago Essential hypertension   [place] [facility] [place], Marzella Schlein, MD   3 months ago Chronic gout due to renal impairment of multiple sites without tophus   [place] [facility] [place], Marzella Schlein, MD   3 months ago Acute gout due to renal impairment involving toe of right foot   [facility] Merita Norton T, FNP   5 months ago Pain of foot, unspecified laterality   [place] [facility] [place], [place], PA-C   6 months ago Chronic diastolic CHF (congestive heart failure) [facility])   [place] [facility] [place], Marzella Schlein, MD       Future Appointments             In 1 month Gollan, Tollie Pizza, MD [place] [facility] [place]   In 1 month Hunsucker, Lesia Sago, MD [facility] [place] [facility]

## 2022-12-02 DIAGNOSIS — Z961 Presence of intraocular lens: Secondary | ICD-10-CM | POA: Diagnosis not present

## 2022-12-02 DIAGNOSIS — H26491 Other secondary cataract, right eye: Secondary | ICD-10-CM | POA: Diagnosis not present

## 2022-12-02 NOTE — Telephone Encounter (Signed)
Patient is returning call.  °

## 2022-12-02 NOTE — Telephone Encounter (Signed)
I spoke w patient.  She said he ultimately wants to feel better and have more energy.  On the days she forgets to take medication in the mornings she feels much more energetic.  Feels sluggish, drug down otherwise.  She'd like to reduce the bisoprolol to 15 mg BID for a week.  If she is feeling better she will be happy to stay on this dose, but if not she'd prefer to decrease further to 10 mg BID.  She is going to start today and will call back next Friday w update.    Pt aware I will forward to Dr. Lalla Brothers to make aware and will update her with his recommendations/approval when she calls back next Friday.

## 2022-12-09 NOTE — Telephone Encounter (Signed)
Patient is feeling much better. She is taking 1.5 tablets of bisoprolol (15 mg) twice daily. She has not been taking her blood pressure and heart rate but will start monitoring and call us back with any concerns.

## 2022-12-09 NOTE — Telephone Encounter (Signed)
Patient is calling back to update saying she has only been taking half a tablet and is feeling much better. She states that she will continue this for another week and will check back in at the end of next week with another update.

## 2022-12-10 ENCOUNTER — Other Ambulatory Visit: Payer: Self-pay | Admitting: Cardiovascular Disease

## 2022-12-10 ENCOUNTER — Other Ambulatory Visit: Payer: Self-pay | Admitting: Family Medicine

## 2022-12-21 DIAGNOSIS — D6869 Other thrombophilia: Secondary | ICD-10-CM | POA: Diagnosis not present

## 2022-12-21 DIAGNOSIS — K589 Irritable bowel syndrome without diarrhea: Secondary | ICD-10-CM | POA: Diagnosis not present

## 2022-12-21 DIAGNOSIS — I2510 Atherosclerotic heart disease of native coronary artery without angina pectoris: Secondary | ICD-10-CM | POA: Diagnosis not present

## 2022-12-21 DIAGNOSIS — F325 Major depressive disorder, single episode, in full remission: Secondary | ICD-10-CM | POA: Diagnosis not present

## 2022-12-21 DIAGNOSIS — E785 Hyperlipidemia, unspecified: Secondary | ICD-10-CM | POA: Diagnosis not present

## 2022-12-21 DIAGNOSIS — G629 Polyneuropathy, unspecified: Secondary | ICD-10-CM | POA: Diagnosis not present

## 2022-12-21 DIAGNOSIS — J4489 Other specified chronic obstructive pulmonary disease: Secondary | ICD-10-CM | POA: Diagnosis not present

## 2022-12-21 DIAGNOSIS — M109 Gout, unspecified: Secondary | ICD-10-CM | POA: Diagnosis not present

## 2022-12-21 DIAGNOSIS — E669 Obesity, unspecified: Secondary | ICD-10-CM | POA: Diagnosis not present

## 2022-12-21 DIAGNOSIS — M8580 Other specified disorders of bone density and structure, unspecified site: Secondary | ICD-10-CM | POA: Diagnosis not present

## 2022-12-21 DIAGNOSIS — R32 Unspecified urinary incontinence: Secondary | ICD-10-CM | POA: Diagnosis not present

## 2022-12-21 DIAGNOSIS — I509 Heart failure, unspecified: Secondary | ICD-10-CM | POA: Diagnosis not present

## 2023-01-02 NOTE — Progress Notes (Unsigned)
Date:  01/03/2023   ID:  Lauren Lloyd, Lauren Lloyd 1941-02-08, MRN [id number]  Patient Location:  [address] [place] [address]   Provider location:   [facility] [place]  PCP:  Erasmo Downer, MD  Cardiologist:  [facility]  Chief Complaint  Patient presents with   Follow-up    Overdue 6 month follow up visit. Patient states that she experiences shortness of breath.  Meds reviewed.     History of Present Illness:    Lauren Lloyd is a 82 y.o. female  past medical history of Aortic atherosclerosis on CT scan 2020 hypertension,  asthma  Chronic lower extremity swelling Hospital admission for atrial fibrillation December 2019 Chronic diastolic CHF Status post successful DCCV on 06/15/2018.  NSR on 06/2018 stress test, 07/2018 Showing no significant ischemia Mild lower extremity swelling, chronic atrial fibrillation started June 2022 after her knee surgery Who presents for follow-up of her atrial fibrillation and acute on chronic diastolic CHF  Last seen by myself in clinic August 2023 Last seen by EP October 21, 2022 On Eliquis for stroke prophylaxis. Post AV nodal ablation.   Previously tried amiodarone, Tikosyn Prior cardioversions  [employer] PPM implanted 10/25/2021 for tachy brady  Chronic shortness of breath on exertion Seen by pulmonary, felt to be multifactorial Etiology; ventricularly paced, bronchial wall thickening on CT scan, cough, PFTs ordered Felt to have asthma versus chronic bronchitis Sedentary at baseline  On CPAP, mask fits better  Lab work reviewed A1c 6.0 Total cholesterol 176 LDL 100 Creatinine 1.06 BUN 22  Echocardiogram May 2024 for shortness of breath EF 60 to 65% Mildly yellow right heart pressures Moderate TR  EKG personally reviewed by myself on todays visit EKG Interpretation Date/Time:  Tuesday January 03 2023 09:11:23 EDT Ventricular Rate:  70 PR Interval:    QRS  Duration:  158 QT Interval:  460 QTC Calculation: 496 R Axis:   -62  Text Interpretation: Ventricular-paced rhythm When compared with ECG of 05-Jul-2022 17:25, Vent. rate has decreased BY  20 BPM Confirmed by Julien Nordmann [phone]) on 01/03/2023 9:12:56 AM   Other past medical history reviewed History of back pain, has received cortisone injections Followed by Dr. Yves Dill   Nov 2020, developed atrial fib  stress test, 07/2018 Showing no significant ischemia normal ejection fraction normal perfusion  Presented to the hospital 04/2018 with worsening shortness of breath, leg swelling, weight gain, abdominal bloating and new atrial fibrillation with RVR Initially on Cardizem infusion, changed to oral Cardizem with titration upwards Discharged on Cardizem CD 300 mg and metoprolol 25 mg bid, Eliquis. Treated with IV Lasix during her hospital course with marked improvement in her shortness of breath   Carotid ultrasound January 2020 Less than 39% disease bilaterally   Past Medical History:  Diagnosis Date   (HFpEF) heart failure with preserved ejection fraction (HCC)    a. 05/2018 Echo: EF 55-60%, no rwma, mild to mod MR. Nl RV fxn. Mod TR. PASP .   Arthritis    knees, Hands   Arthritis of knee    Back pain    Carotid arterial disease (HCC)    a. 03/2019 Carotid U/S: <50% bilat ICA stenoses.   Cholelithiasis    a. 10/2018 noted on CT.   Edema, lower extremity    Fatty liver    GERD (gastroesophageal reflux disease)    History of stress test    a. 06/2018 MV: EF 59%, no  ischemia/infarct. Low risk.   Knee pain    Lactose intolerance    Mitral regurgitation    a. 05/2018 Echo: mild to mod MR.   Multinodular goiter    Obesity    OSA (obstructive sleep apnea)    PAF (paroxysmal atrial fibrillation) (HCC)    a.  Diagnosed 12/19; b. 05/2018 s/p DCCV; c. 03/2019 & 05/2019 recurrent AFib-->managed w/ amio load; d. CHADS2VASc = 6 (CHF, HTN, age x 2, vascular disease, female)-->Eliquis  & amio 100 qd.   PAH (pulmonary artery hypertension) (HCC)    RSV (acute bronchiolitis due to respiratory syncytial virus) 05/10/2022   Scoliosis    SOB (shortness of breath)    Swallowing difficulty    Past Surgical History:  Procedure Laterality Date   AV NODE ABLATION N/A 07/05/2022   Procedure: AV NODE ABLATION;  Surgeon: Lanier Prude, MD;  [facility];  Service: Cardiovascular;  Laterality: N/A;   CARDIOVERSION N/A 06/15/2018   Procedure: CARDIOVERSION (CATH LAB);  Surgeon: Antonieta Iba, MD;  Location: [facility] ORS;  Service: Cardiovascular;  Laterality: N/A;   CARDIOVERSION N/A 02/18/2021   Procedure: CARDIOVERSION;  Surgeon: Antonieta Iba, MD;  Location: [facility] ORS;  Service: Cardiovascular;  Laterality: N/A;   CATARACT EXTRACTION W/PHACO Right 01/25/2016   Procedure: CATARACT EXTRACTION PHACO AND INTRAOCULAR LENS PLACEMENT (IOC);  Surgeon: Sherald Hess, MD;  Location: [facility];  Service: Ophthalmology;  Laterality: Right;  RIGHT   CATARACT EXTRACTION W/PHACO Left 02/22/2016   Procedure: CATARACT EXTRACTION PHACO AND INTRAOCULAR LENS PLACEMENT (IOC);  Surgeon: Sherald Hess, MD;  Location: [facility];  Service: Ophthalmology;  Laterality: Left;  LEFT   HAMMER TOE SURGERY  05/16/2008   KNEE ARTHROSCOPY Right 05/16/2002   PACEMAKER IMPLANT N/A 10/25/2021   Procedure: PACEMAKER IMPLANT;  Surgeon: Lanier Prude, MD;  [facility]: [facility];  Service: Cardiovascular;  Laterality: N/A;   PARTIAL HIP ARTHROPLASTY Left 12/17/2021   REPLACEMENT TOTAL KNEE Right 05/17/2007   [facility]   SKIN GRAFT Left 04/08/2013   Done on left index finger   TONSILLECTOMY  05/16/1944   TOTAL KNEE ARTHROPLASTY Left 12/02/2020     Current Meds  Medication Sig   acetaminophen (TYLENOL) 325 MG tablet Take 650 mg by mouth every 6 (six) hours as needed for moderate pain.   allopurinol (ZYLOPRIM) 100 MG  tablet Take 1 tablet (100 mg total) by mouth daily.   benzonatate (TESSALON) 100 MG capsule Take 1 capsule (100 mg total) by mouth 2 (two) times daily as needed for cough.   bisoprolol (ZEBETA) 10 MG tablet TAKE TWO TABLETS BY MOUTH TWICE DAILY (Patient taking differently: Take 20 mg by mouth 2 (two) times daily.)   calcium carbonate (TUMS EX) 750 MG chewable tablet Chew 2 tablets by mouth daily as needed for heartburn.   Cholecalciferol 25 MCG (1000 UT) tablet Take 1,000 Units by mouth daily.   colchicine 0.6 MG tablet Take 2 tablets once, repeat with 1 tablet 12 hours following. Continue with 1 tablet every 12 hours until resolved.   ELIQUIS 5 MG TABS tablet TAKE ONE TABLET TWICE DAILY   ezetimibe (ZETIA) 10 MG tablet TAKE 1 TABLET BY MOUTH DAILY   furosemide (LASIX) 40 MG tablet TAKE 1 TABLET BY MOUTH DAILY. TAKE AN EXTRA TABLET AS NEEDED AFTER LUNCH FOR ABDOMINAL SWELLING, LEG SWELLING OR SHORTNESS OF BREATH   gabapentin (NEURONTIN) 600 MG tablet TAKE ONE TABLET BY MOUTH AT BEDTIME  levalbuterol (XOPENEX HFA) 45 MCG/ACT inhaler Inhale 2 puffs into the lungs every 8 (eight) hours as needed for wheezing.   Menthol, Topical Analgesic, (BIOFREEZE EX) Apply 1 application. topically daily as needed (Neck pain).   metaxalone (SKELAXIN) 800 MG tablet Take 800 mg by mouth daily as needed for muscle spasms.   Multiple Vitamin (MULTIVITAMIN) capsule Take 1 capsule by mouth daily.   mupirocin ointment (BACTROBAN) 2 % Place 1 application  into the nose 2 (two) times daily.   naltrexone (DEPADE) 50 MG tablet TAKE 1/2 TABLET BY MOUTH DAILY   potassium chloride (KLOR-CON) 10 MEQ tablet TAKE 1 TABLET BY MOUTH DAILY   Tiotropium Bromide-Olodaterol (STIOLTO RESPIMAT) 2.5-2.5 MCG/ACT AERS Inhale 2 puffs into the lungs daily.     Allergies:   Levofloxacin, Influenza vaccine recombinant, Influenza vaccines, and Oysters [shellfish allergy]   Social History   Tobacco Use   Smoking status: Former    Current  packs/day: 0.00    Average packs/day: 1 pack/day for 30.0 years (30.0 ttl pk-yrs)    Types: Cigarettes    Start date: 05/17/1959    Quit date: 05/16/1989    Years since quitting: 33.6    Passive exposure: Past   Smokeless tobacco: Never  Vaping Use   Vaping status: Never Used  Substance Use Topics   Alcohol use: Yes    Alcohol/week: 7.0 standard drinks of alcohol    Types: 7 Glasses of wine per week    Comment: 0-2 a night   Drug use: No    Family Hx: [name] family history includes Atrial fibrillation in her sister and sister; Breast cancer (age of onset: 47) in her sister; Healthy in her brother; Heart attack in her father; Heart disease in her mother; High blood pressure in her father; Hyperlipidemia in her sister and sister; Stroke in her father; Transient ischemic attack in her mother.  ROS:   Please see the history of present illness.    Review of Systems  Constitutional: Negative.   HENT: Negative.    Respiratory:  Positive for shortness of breath.   Cardiovascular: Negative.   Gastrointestinal: Negative.   Musculoskeletal:  Positive for joint pain.  Neurological: Negative.   Psychiatric/Behavioral: Negative.    All other systems reviewed and are negative.    Labs/Other Tests and Data Reviewed:    Recent Labs: 05/11/2022: Magnesium 2.0 05/12/2022: B Natriuretic Peptide 746.2 07/05/2022: Hemoglobin 14.6; Platelets 222 08/04/2022: NT-Pro BNP 2,017 10/28/2022: ALT 14; BUN 22; Creatinine, Ser 1.06; Potassium 4.5; Sodium 142   Recent Lipid Panel Lab Results  Component Value Date/Time   CHOL 176 10/28/2022 08:01 AM   TRIG 126 10/28/2022 08:01 AM   HDL 54 10/28/2022 08:01 AM   CHOLHDL 3.3 10/28/2022 08:01 AM   CHOLHDL 3.6 05/31/2018 04:52 AM   LDLCALC 100 (H) 10/28/2022 08:01 AM    Wt Readings from Last 3 Encounters:  01/03/23 217 lb 6.4 oz (98.6 kg)  10/26/22 223 lb 6.4 oz (101.3 kg)  10/21/22 223 lb (101.2 kg)     Exam:    BP 106/60 (BP Location: Left  Arm, Patient Position: Sitting, Cuff Size: Large)   Pulse 70   Ht 5' 7.5" (1.715 m)   Wt 217 lb 6.4 oz (98.6 kg)   SpO2 93%   BMI 33.55 kg/m  Constitutional:  oriented to person, place, and time. No distress.  HENT:  Head: Grossly normal Eyes:  no discharge. No scleral icterus.  Neck: No JVD, no carotid bruits  Cardiovascular:  Irregularly irregular, no murmurs appreciated Pulmonary/Chest: Clear to auscultation bilaterally, no wheezes or rails Abdominal: Soft.  no distension.  no tenderness.  Musculoskeletal: Normal range of motion Neurological:  normal muscle tone. Coordination normal. No atrophy Skin: Skin warm and dry Psychiatric: normal affect, pleasant    ASSESSMENT & PLAN:    Paroxysmal atrial fibrillation (HCC) -  History of AV node ablation, ventricularly paced Followed by EP off amiodarone, off Tikosyn Tolerating Eliquis, bisoprolol down to 10 twice daily given low blood pressure, shortness of breath  Chronic shortness of breath Likely multifactorial including underlying lung disease, A-fib, ventricularly paced, chronic diastolic CHF, obesity, deconditioning Reduced dose bisoprolol as above Order placed for pulmonary rehab Recommend she try her inhalers, currently sitting in a cabinet  Chronic diastolic CHF (congestive heart failure) (HCC) Exacerbated by atrial fibrillation with rapid rate Lasix 40 daily, extra Lasix for leg swelling Echocardiogram May 2024 with mildly elevated right heart pressures  Leg edema We will avoid calcium channel blockers Minimal on today's visit   Centrilobular emphysema (HCC)  Prior history of smoking Followed by pulmonary, Feels her symptoms are stable but does have chronic shortness of breath limiting activities  Sleep apnea CPAP mask fitting better, now compliant  Obesity Low carbohydrate diet recommended, lung works program  Pulmonary hypertension, unspecified (HCC) Mild pulmonary hypertension seen on prior  echocardiogram exacerbated by atrial fibrillation and CHF, obesity Lasix daily  Back pain, knee pain Stable   Total encounter time more than 30 minutes  Greater than 50% was spent in counseling and coordination of care with the patient     Signed, Julien Nordmann, MD  01/03/2023 9:40 AM    [facility] [address], [place], [place] [address]

## 2023-01-03 ENCOUNTER — Ambulatory Visit: Payer: Medicare HMO | Attending: Cardiovascular Disease | Admitting: Cardiovascular Disease

## 2023-01-03 ENCOUNTER — Encounter: Payer: Self-pay | Admitting: Cardiovascular Disease

## 2023-01-03 VITALS — BP 106/60 | HR 70 | Ht 67.5 in | Wt 217.4 lb

## 2023-01-03 DIAGNOSIS — Z950 Presence of cardiac pacemaker: Secondary | ICD-10-CM | POA: Diagnosis not present

## 2023-01-03 DIAGNOSIS — I4821 Permanent atrial fibrillation: Secondary | ICD-10-CM

## 2023-01-03 DIAGNOSIS — J432 Centrilobular emphysema: Secondary | ICD-10-CM | POA: Diagnosis not present

## 2023-01-03 DIAGNOSIS — I5032 Chronic diastolic (congestive) heart failure: Secondary | ICD-10-CM | POA: Diagnosis not present

## 2023-01-03 DIAGNOSIS — I495 Sick sinus syndrome: Secondary | ICD-10-CM

## 2023-01-03 DIAGNOSIS — R0600 Dyspnea, unspecified: Secondary | ICD-10-CM | POA: Diagnosis not present

## 2023-01-03 DIAGNOSIS — G4733 Obstructive sleep apnea (adult) (pediatric): Secondary | ICD-10-CM

## 2023-01-03 DIAGNOSIS — I10 Essential (primary) hypertension: Secondary | ICD-10-CM | POA: Diagnosis not present

## 2023-01-03 MED ORDER — BISOPROLOL FUMARATE 10 MG PO TABS: 10.0000 mg | ORAL_TABLET | Freq: Two times a day (BID) | ORAL | 3 refills | Status: DC

## 2023-01-03 NOTE — Patient Instructions (Addendum)
Pulmonary rehab (Lung Works program):  chronic SOB, chronic bronchitis/asthma, chronic diastolic CHF, OSA  Medication Instructions:  Please decrease bisoprolol down to 10 mg twice a day  If you need a refill on your cardiac medications before your next appointment, please call your pharmacy.   Lab work: No new labs needed  Testing/Procedures: No new testing needed  Follow-Up: At [facility], you and your health needs are our priority.  As part of our continuing mission to provide you with exceptional heart care, we have created designated Provider Care Teams.  These Care Teams include your primary Cardiologist (physician) and Advanced Practice Providers (APPs -  Physician Assistants and Nurse Practitioners) who all work together to provide you with the care you need, when you need it.  You will need a follow up appointment in 6 months  Providers on your designated Care Team:   Nicolasa Ducking, NP Eula Listen, PA-C Cadence Fransico Michael, [place]  COVID-19 Vaccine Information can be found at: [email or web address] For questions related to vaccine distribution or appointments, please email [email or web address] or call [phone].

## 2023-01-06 ENCOUNTER — Other Ambulatory Visit: Payer: Self-pay | Admitting: Cardiovascular Disease

## 2023-01-09 ENCOUNTER — Encounter: Payer: Self-pay | Admitting: Pulmonary Disease

## 2023-01-09 ENCOUNTER — Ambulatory Visit (INDEPENDENT_AMBULATORY_CARE_PROVIDER_SITE_OTHER): Payer: Medicare HMO | Admitting: Pulmonary Disease

## 2023-01-09 ENCOUNTER — Ambulatory Visit: Payer: Medicare HMO | Admitting: Pulmonary Disease

## 2023-01-09 VITALS — BP 138/70 | HR 77 | Ht 67.0 in | Wt 222.6 lb

## 2023-01-09 DIAGNOSIS — R0609 Other forms of dyspnea: Secondary | ICD-10-CM

## 2023-01-09 DIAGNOSIS — U071 COVID-19: Secondary | ICD-10-CM | POA: Diagnosis not present

## 2023-01-09 LAB — PULMONARY FUNCTION TEST
DL/VA % pred: 120 %
DL/VA: 4.76 ml/min/mmHg/L
DLCO cor % pred: 76 %
DLCO cor: 15.74 ml/min/mmHg
DLCO unc % pred: 76 %
DLCO unc: 15.74 ml/min/mmHg
FEF 25-75 Post: 1.23 L/s
FEF 25-75 Pre: 0.97 L/s
FEF2575-%Change-Post: 26 %
FEF2575-%Pred-Post: 86 %
FEF2575-%Pred-Pre: 67 %
FEV1-%Change-Post: 5 %
FEV1-%Pred-Post: 63 %
FEV1-%Pred-Pre: 60 %
FEV1-Post: 1.35 L
FEV1-Pre: 1.28 L
FEV1FVC-%Change-Post: 5 %
FEV1FVC-%Pred-Pre: 102 %
FEV6-%Change-Post: 0 %
FEV6-%Pred-Post: 62 %
FEV6-%Pred-Pre: 63 %
FEV6-Post: 1.69 L
FEV6-Pre: 1.71 L
FEV6FVC-%Pred-Post: 105 %
FEV6FVC-%Pred-Pre: 105 %
FVC-%Change-Post: 0 %
FVC-%Pred-Post: 59 %
FVC-%Pred-Pre: 59 %
FVC-Post: 1.69 L
FVC-Pre: 1.71 L
Post FEV1/FVC ratio: 80 %
Post FEV6/FVC ratio: 100 %
Pre FEV1/FVC ratio: 75 %
Pre FEV6/FVC Ratio: 100 %
RV % pred: 89 %
RV: 2.33 L
TLC % pred: 73 %
TLC: 4.03 L

## 2023-01-09 NOTE — Patient Instructions (Signed)
Full PFT performed today. °

## 2023-01-09 NOTE — Progress Notes (Signed)
Full PFT performed today. °

## 2023-01-09 NOTE — Patient Instructions (Signed)
Nice to  see you   Use Stiolto 2 puffs once a day - in the morning  I sent a referral for pulmonary rehab at [facility]

## 2023-01-09 NOTE — Progress Notes (Signed)
@Patient  ID: Lauren Lloyd, female    DOB: Jan 09, 1941, 82 y.o.   MRN: [id number]  Chief Complaint  Patient presents with   Follow-up    Pt is here for a PFT and F/U to discuss results.    Referring provider: Erasmo Downer, MD  HPI:   82 y.o. woman whom we are seeing for evaluation of dyspnea exertion.  Most recent cardiology note  reviewed.  Returns for routine follow-up.  Prescribe Stiolto at last visit to see if it helped.  Sounds like was not using on a daily basis.  Would not use it and felt like she would need it when walking long distances.  Overall when she did use it not sure it helped.  Whenever pulmonary function test today.  No COPD but mild restriction related to habitus with very low ERV, DLCO within normal limits.  We discussed trying inhalers again.  She does have significant smoking history.  Fortunately, she has no fixed obstruction.  HPI at initial visit: Patient notes dyspnea for some time now months to years.  Worse on inclines or stairs.  Present on flat surfaces well but not as quickly.  No time of day when things are better or worse.  Improves with rest but otherwise no position to make things better or worse.  No seasonal or environmental factors she can identify to make things better or worse.  No alleviating or otherwise exacerbating factors noted.  She thinks a lot of this started when she went to atrial fibrillation in the past.  This when she first notes in her timeline issues with dyspnea.  She does have a cough.  Brings up light yellow sputum.  Reviewed serial echocardiograms in the past revealed diastolic dysfunction, and chronically elevated estimated PASP similar in readings over the last 3-4 dating back to 2016 at the most remote TTE that I can review results of.  Her most recent chest imaging, cross-sectional imaging, 02/2021 reveals clear lungs with some mild bronchial thickening in the bases on my review and interpretation.  Questionaires /  Pulmonary Flowsheets:   ACT:      No data to display          MMRC:     No data to display          Epworth:     12/14/2016    9:00 AM  Results of the Epworth flowsheet  Sitting and reading 1  Watching TV 1  Sitting, inactive in a public place (e.g. a theatre or a meeting) 0  As a passenger in a car for an hour without a break 1  Lying down to rest in the afternoon when circumstances permit 1  Sitting and talking to someone 0  Sitting quietly after a lunch without alcohol 0  In a car, while stopped for a few minutes in traffic 0  Total score 4    Tests:   FENO:  No results found for: "NITRICOXIDE"  PFT:    Latest Ref Rng & Units 01/09/2023    2:14 PM  PFT Results  FVC-Pre L 1.71  P  FVC-Predicted Pre % 59  P  FVC-Post L 1.69  P  FVC-Predicted Post % 59  P  Pre FEV1/FVC % % 75  P  Post FEV1/FCV % % 80  P  FEV1-Pre L 1.28  P  FEV1-Predicted Pre % 60  P  FEV1-Post L 1.35  P  DLCO uncorrected ml/min/mmHg 15.74  P  DLCO UNC% %  76  P  DLCO corrected ml/min/mmHg 15.74  P  DLCO COR %Predicted % 76  P  DLVA Predicted % 120  P  TLC L 4.03  P  TLC % Predicted % 73  P  RV % Predicted % 89  P    P Preliminary result    WALK:      No data to display          Imaging: No results found.  Lab Results:  CBC    Component Value Date/Time   WBC 8.5 07/05/2022 1222   RBC 4.75 07/05/2022 1222   HGB 14.6 07/05/2022 1222   HGB 14.5 11/04/2021 1118   HCT 44.4 07/05/2022 1222   HCT 41.7 11/04/2021 1118   PLT 222 07/05/2022 1222   PLT 298 11/04/2021 1118   MCV 93.5 07/05/2022 1222   MCV 90 11/04/2021 1118   MCH 30.7 07/05/2022 1222   MCHC 32.9 07/05/2022 1222   RDW 14.2 07/05/2022 1222   RDW 12.7 11/04/2021 1118   LYMPHSABS 1.3 05/10/2022 1255   LYMPHSABS 2.3 08/03/2020 1012   MONOABS 1.3 (H) 05/10/2022 1255   EOSABS 0.2 05/10/2022 1255   EOSABS 0.2 08/03/2020 1012   BASOSABS 0.1 05/10/2022 1255   BASOSABS 0.1 08/03/2020 1012    BMET     Component Value Date/Time   NA 142 10/28/2022 0801   K 4.5 10/28/2022 0801   CL 105 10/28/2022 0801   CO2 24 10/28/2022 0801   GLUCOSE 111 (H) 10/28/2022 0801   GLUCOSE 90 05/25/2022 1005   BUN 22 10/28/2022 0801   CREATININE 1.06 (H) 10/28/2022 0801   CREATININE 0.78 01/19/2017 1522   CALCIUM 9.7 10/28/2022 0801   GFRNONAA 44 (L) 05/25/2022 1005   GFRNONAA 73 01/19/2017 1522   GFRAA 75 12/09/2019 1411   GFRAA 85 01/19/2017 1522    BNP    Component Value Date/Time   BNP 746.2 (H) 05/12/2022 0453    ProBNP    Component Value Date/Time   PROBNP 2,017 (H) 08/04/2022 1107    Specialty Problems       Pulmonary Problems   OSA (obstructive sleep apnea)   Centrilobular emphysema (HCC)   Dyspnea on exertion    Allergies  Allergen Reactions   Levofloxacin Other (See Comments)    Other reaction(s): Joint Pains    Influenza Vaccine Recombinant Other (See Comments)   Influenza Vaccines Other (See Comments)    Bell's Palsy   Oysters [Shellfish Allergy] Swelling    She states she had eaten them three days in a row and she developed swelling around her eyes.     Immunization History  Administered Date(s) Administered   Hepatitis A 11/25/1999, 09/19/2001   Hepatitis A, Adult 11/25/1999, 09/19/2001   IPV 02/11/2000   PFIZER(Purple Top)SARS-COV-2 Vaccination 06/14/2019, 07/05/2019, 04/15/2020   Pneumococcal Conjugate-13 10/06/2014   Pneumococcal Polysaccharide-23 12/01/2010   Td 02/02/1998   Tdap 09/11/2007, 02/27/2012   Typhoid Inactivated 02/11/2000   Zoster Recombinant(Shingrix) 02/06/2020, 05/29/2020    Past Medical History:  Diagnosis Date   (HFpEF) heart failure with preserved ejection fraction (HCC)    a. 05/2018 Echo: EF 55-60%, no rwma, mild to mod MR. Nl RV fxn. Mod TR. PASP .   Arthritis    knees, Hands   Arthritis of knee    Back pain    Carotid arterial disease (HCC)    a. 03/2019 Carotid U/S: <50% bilat ICA stenoses.   Cholelithiasis    a.  10/2018 noted on CT.  Edema, lower extremity    Fatty liver    GERD (gastroesophageal reflux disease)    History of stress test    a. 06/2018 MV: EF 59%, no ischemia/infarct. Low risk.   Knee pain    Lactose intolerance    Mitral regurgitation    a. 05/2018 Echo: mild to mod MR.   Multinodular goiter    Obesity    OSA (obstructive sleep apnea)    PAF (paroxysmal atrial fibrillation) (HCC)    a.  Diagnosed 12/19; b. 05/2018 s/p DCCV; c. 03/2019 & 05/2019 recurrent AFib-->managed w/ amio load; d. CHADS2VASc = 6 (CHF, HTN, age x 2, vascular disease, female)-->Eliquis & amio 100 qd.   PAH (pulmonary artery hypertension) (HCC)    RSV (acute bronchiolitis due to respiratory syncytial virus) 05/10/2022   Scoliosis    SOB (shortness of breath)    Swallowing difficulty     Tobacco History: Social History   Tobacco Use  Smoking Status Former   Current packs/day: 0.00   Average packs/day: 1 pack/day for 30.0 years (30.0 ttl pk-yrs)   Types: Cigarettes   Start date: 05/17/1959   Quit date: 05/16/1989   Years since quitting: 33.6   Passive exposure: Past  Smokeless Tobacco Never   Counseling given: Not Answered   Continue to not smoke  Outpatient Encounter Medications as of 01/09/2023  Medication Sig   acetaminophen (TYLENOL) 325 MG tablet Take 650 mg by mouth every 6 (six) hours as needed for moderate pain.   allopurinol (ZYLOPRIM) 100 MG tablet Take 1 tablet (100 mg total) by mouth daily.   bisoprolol (ZEBETA) 10 MG tablet Take 1 tablet (10 mg total) by mouth 2 (two) times daily.   calcium carbonate (TUMS EX) 750 MG chewable tablet Chew 2 tablets by mouth daily as needed for heartburn.   Cholecalciferol 25 MCG (1000 UT) tablet Take 1,000 Units by mouth daily.   colchicine 0.6 MG tablet Take 2 tablets once, repeat with 1 tablet 12 hours following. Continue with 1 tablet every 12 hours until resolved.   ELIQUIS 5 MG TABS tablet TAKE ONE TABLET TWICE DAILY   ezetimibe (ZETIA) 10 MG tablet  TAKE 1 TABLET BY MOUTH DAILY   furosemide (LASIX) 40 MG tablet TAKE 1 TABLET BY MOUTH DAILY. TAKE AN EXTRA TABLET AS NEEDED AFTER LUNCH FOR ABDOMINAL SWELLING, LEG SWELLING OR SHORTNESS OF BREATH   gabapentin (NEURONTIN) 600 MG tablet TAKE ONE TABLET BY MOUTH AT BEDTIME   levalbuterol (XOPENEX HFA) 45 MCG/ACT inhaler Inhale 2 puffs into the lungs every 8 (eight) hours as needed for wheezing.   Menthol, Topical Analgesic, (BIOFREEZE EX) Apply 1 application. topically daily as needed (Neck pain).   metaxalone (SKELAXIN) 800 MG tablet Take 800 mg by mouth daily as needed for muscle spasms.   Multiple Vitamin (MULTIVITAMIN) capsule Take 1 capsule by mouth daily.   mupirocin ointment (BACTROBAN) 2 % Place 1 application  into the nose 2 (two) times daily.   naltrexone (DEPADE) 50 MG tablet TAKE 1/2 TABLET BY MOUTH DAILY   potassium chloride (KLOR-CON) 10 MEQ tablet TAKE 1 TABLET BY MOUTH DAILY   Tiotropium Bromide-Olodaterol (STIOLTO RESPIMAT) 2.5-2.5 MCG/ACT AERS Inhale 2 puffs into the lungs daily.   [DISCONTINUED] benzonatate (TESSALON) 100 MG capsule Take 1 capsule (100 mg total) by mouth 2 (two) times daily as needed for cough.   No facility-administered encounter medications on file as of 01/09/2023.     Review of Systems  Review of Systems  No chest pain  with exertion.  No orthopnea or PND.  Comprehensive review of systems otherwise negative. Physical Exam  BP 138/70 (BP Location: Left Arm, Cuff Size: Normal)   Pulse 77   Ht 5\' 7"  (1.702 m)   Wt 222 lb 9.6 oz (101 kg)   SpO2 97%   BMI 34.86 kg/m   Wt Readings from Last 5 Encounters:  01/09/23 222 lb 9.6 oz (101 kg)  01/03/23 217 lb 6.4 oz (98.6 kg)  10/26/22 223 lb 6.4 oz (101.3 kg)  10/21/22 223 lb (101.2 kg)  10/03/22 222 lb (100.7 kg)    BMI Readings from Last 5 Encounters:  01/09/23 34.86 kg/m  01/03/23 33.55 kg/m  10/26/22 34.99 kg/m  10/21/22 34.93 kg/m  10/03/22 34.77 kg/m     Physical Exam General:  Sitting in chair, no acute distress Eyes: EOMI, no icterus Neck: Supple, no JVP Pulmonary: Clear, normal work of breathing Cardiovascular: Regular rate and rhythm, no murmur Abdomen: Nondistended, bowel sounds present MSK: No synovitis, no joint effusion Neuro: Normal gait, no weakness Psych: Normal mood, full affect   Assessment & Plan:   Dyspnea on exertion: Suspect this is multifactorial.  Review of multiple pacemaker checks indicates she is V paced 100% of the time with threshold of 70 bpm.  Do wonder if her heart rate is unable to go above this due to tachycardia bradycardia now permanent bradycardia syndrome and as such she does not have the compensatory increase in cardiac output related to inability to increase heart rate.  She has history of elevated PASP dating back to 2016, likely group 2 in nature given her A-fib, diastolic dysfunction etc.  Overall stable over time but certainly pulmonary hypertension can cause dyspnea.  She has some mild bronchial wall thickening on CT scan and some cough which could certainly implicate possible asthma.  PFTs without fixed obstruction.  Very low ERV with mild restriction likely to habitus, DLCO within normal limits.  She has emphysema and prior COVID infection, referral to pulmonary rehab sent today.  Asthma versus chronic bronchitis: Clinical diagnosis based on bronchial wall thickening as well as cigarette smoking history.  Encouraged to try Stiolto every day for several weeks to see if it helps.   Return in about 12 weeks (around 04/03/2023) for f/u Dr. Judeth Horn.   Karren Burly, MD 01/09/2023

## 2023-01-11 ENCOUNTER — Ambulatory Visit (INDEPENDENT_AMBULATORY_CARE_PROVIDER_SITE_OTHER): Payer: Medicare HMO

## 2023-01-11 VITALS — Ht 67.5 in | Wt 222.0 lb

## 2023-01-11 DIAGNOSIS — Z0000 Encounter for general adult medical examination without abnormal findings: Secondary | ICD-10-CM

## 2023-01-11 NOTE — Progress Notes (Signed)
Subjective:   Lauren Lloyd is a 82 y.o. female who presents for Medicare Annual (Subsequent) preventive examination.  Visit Complete: Virtual  I connected with  Tori C George on 01/11/23 by a audio enabled telemedicine application and verified that I am speaking with the correct person using two identifiers.  Patient Location: Home  Provider Location: Office/Clinic  I discussed the limitations of evaluation and management by telemedicine. The patient expressed understanding and agreed to proceed.  Vital Signs: Unable to obtain new vitals due to this being a telehealth visit.  Patient Medicare AWV questionnaire was completed by the patient on (not done); I have confirmed that all information answered by patient is correct and no changes since this date.  Review of Systems    Cardiac Risk Factors include: advanced age (>29men, >65 women);hypertension;obesity (BMI >30kg/m2);sedentary lifestyle;dyslipidemia    Objective:    Today's Vitals   01/11/23 1110  Weight: 222 lb (100.7 kg)  Height: 5' 7.5" (1.715 m)   Body mass index is 34.26 kg/m.     01/11/2023   11:25 AM 07/05/2022   12:13 PM 10/21/2021    1:00 PM 02/18/2021    7:07 AM 09/24/2020    9:47 AM 02/03/2020    8:39 AM 06/10/2019    8:22 PM  Advanced Directives  Does Patient Have a Medical Advance Directive? Yes No No Yes Yes Yes Yes  Type of Estate agent of [place];Living will   Healthcare Power of [place];Living will Healthcare Power of [place];Living will Healthcare Power of [place];Living will Living will;Healthcare Power of Attorney  Does patient want to make changes to medical advance directive?    No - Patient declined   No - Patient declined  Copy of Healthcare Power of Attorney in Chart?      No - copy requested   Would patient like information on creating a medical advance directive?  No - Patient declined No - Patient declined        Current Medications (verified) Outpatient Encounter  Medications as of 01/11/2023  Medication Sig   acetaminophen (TYLENOL) 325 MG tablet Take 650 mg by mouth every 6 (six) hours as needed for moderate pain.   allopurinol (ZYLOPRIM) 100 MG tablet Take 1 tablet (100 mg total) by mouth daily.   bisoprolol (ZEBETA) 10 MG tablet Take 1 tablet (10 mg total) by mouth 2 (two) times daily.   calcium carbonate (TUMS EX) 750 MG chewable tablet Chew 2 tablets by mouth daily as needed for heartburn.   Cholecalciferol 25 MCG (1000 UT) tablet Take 1,000 Units by mouth daily.   colchicine 0.6 MG tablet Take 2 tablets once, repeat with 1 tablet 12 hours following. Continue with 1 tablet every 12 hours until resolved.   ELIQUIS 5 MG TABS tablet TAKE ONE TABLET TWICE DAILY   ezetimibe (ZETIA) 10 MG tablet TAKE 1 TABLET BY MOUTH DAILY   furosemide (LASIX) 40 MG tablet TAKE 1 TABLET BY MOUTH DAILY. TAKE AN EXTRA TABLET AS NEEDED AFTER LUNCH FOR ABDOMINAL SWELLING, LEG SWELLING OR SHORTNESS OF BREATH   gabapentin (NEURONTIN) 600 MG tablet TAKE ONE TABLET BY MOUTH AT BEDTIME   levalbuterol (XOPENEX HFA) 45 MCG/ACT inhaler Inhale 2 puffs into the lungs every 8 (eight) hours as needed for wheezing.   Menthol, Topical Analgesic, (BIOFREEZE EX) Apply 1 application. topically daily as needed (Neck pain).   metaxalone (SKELAXIN) 800 MG tablet Take 800 mg by mouth daily as needed for muscle spasms.   Multiple Vitamin (MULTIVITAMIN)  capsule Take 1 capsule by mouth daily.   mupirocin ointment (BACTROBAN) 2 % Place 1 application  into the nose 2 (two) times daily.   naltrexone (DEPADE) 50 MG tablet TAKE 1/2 TABLET BY MOUTH DAILY   potassium chloride (KLOR-CON) 10 MEQ tablet TAKE 1 TABLET BY MOUTH DAILY   Tiotropium Bromide-Olodaterol (STIOLTO RESPIMAT) 2.5-2.5 MCG/ACT AERS Inhale 2 puffs into the lungs daily.   No facility-administered encounter medications on file as of 01/11/2023.    Allergies (verified) Levofloxacin, Influenza vaccine recombinant, Influenza vaccines, and  Oysters [shellfish allergy]   History: Past Medical History:  Diagnosis Date   (HFpEF) heart failure with preserved ejection fraction (HCC)    a. 05/2018 Echo: EF 55-60%, no rwma, mild to mod MR. Nl RV fxn. Mod TR. PASP .   Arthritis    knees, Hands   Arthritis of knee    Back pain    Carotid arterial disease (HCC)    a. 03/2019 Carotid U/S: <50% bilat ICA stenoses.   Cholelithiasis    a. 10/2018 noted on CT.   Edema, lower extremity    Fatty liver    GERD (gastroesophageal reflux disease)    History of stress test    a. 06/2018 MV: EF 59%, no ischemia/infarct. Low risk.   Knee pain    Lactose intolerance    Mitral regurgitation    a. 05/2018 Echo: mild to mod MR.   Multinodular goiter    Obesity    OSA (obstructive sleep apnea)    PAF (paroxysmal atrial fibrillation) (HCC)    a.  Diagnosed 12/19; b. 05/2018 s/p DCCV; c. 03/2019 & 05/2019 recurrent AFib-->managed w/ amio load; d. CHADS2VASc = 6 (CHF, HTN, age x 2, vascular disease, female)-->Eliquis & amio 100 qd.   PAH (pulmonary artery hypertension) (HCC)    RSV (acute bronchiolitis due to respiratory syncytial virus) 05/10/2022   Scoliosis    SOB (shortness of breath)    Swallowing difficulty    Past Surgical History:  Procedure Laterality Date   AV NODE ABLATION N/A 07/05/2022   Procedure: AV NODE ABLATION;  Surgeon: Lanier Prude, MD;  Location: [facility];  Service: Cardiovascular;  Laterality: N/A;   CARDIOVERSION N/A 06/15/2018   Procedure: CARDIOVERSION (CATH LAB);  Surgeon: Antonieta Iba, MD;  Location: [facility] ORS;  Service: Cardiovascular;  Laterality: N/A;   CARDIOVERSION N/A 02/18/2021   Procedure: CARDIOVERSION;  Surgeon: Antonieta Iba, MD;  Location: [facility] ORS;  Service: Cardiovascular;  Laterality: N/A;   CATARACT EXTRACTION W/PHACO Right 01/25/2016   Procedure: CATARACT EXTRACTION PHACO AND INTRAOCULAR LENS PLACEMENT (IOC);  Surgeon: Sherald Hess, MD;  Location: [facility];  Service: Ophthalmology;  Laterality: Right;  RIGHT   CATARACT EXTRACTION W/PHACO Left 02/22/2016   Procedure: CATARACT EXTRACTION PHACO AND INTRAOCULAR LENS PLACEMENT (IOC);  Surgeon: Sherald Hess, MD;  Location: [facility];  Service: Ophthalmology;  Laterality: Left;  LEFT   HAMMER TOE SURGERY  05/16/2008   KNEE ARTHROSCOPY Right 05/16/2002   PACEMAKER IMPLANT N/A 10/25/2021   Procedure: PACEMAKER IMPLANT;  Surgeon: Lanier Prude, MD;  Location: [facility];  Service: Cardiovascular;  Laterality: N/A;   PARTIAL HIP ARTHROPLASTY Left 12/17/2021   REPLACEMENT TOTAL KNEE Right 05/17/2007   [facility]   SKIN GRAFT Left 04/08/2013   Done on left index finger   TONSILLECTOMY  05/16/1944   TOTAL KNEE ARTHROPLASTY Left 12/02/2020   Family History  Problem Relation Age of Onset  Hyperlipidemia Sister    Atrial fibrillation Sister    Transient ischemic attack Mother    Heart disease Mother    Heart attack Father    High blood pressure Father    Stroke Father    Healthy Brother    Breast cancer Sister 61   Hyperlipidemia Sister    Atrial fibrillation Sister    Social History   Socioeconomic History   Marital status: Single    Spouse name: Not on file   Number of children: 5   Years of education: college   Highest education level: Bachelor's degree (e.g., BA, AB, BS)  Occupational History   Occupation: Lawyer:  [employer]  Tobacco Use   Smoking status: Former    Current packs/day: 0.00    Average packs/day: 1 pack/day for 30.0 years (30.0 ttl pk-yrs)    Types: Cigarettes    Start date: 05/17/1959    Quit date: 05/16/1989    Years since quitting: 33.6    Passive exposure: Past   Smokeless tobacco: Never  Vaping Use   Vaping status: Never Used  Substance and Sexual Activity   Alcohol use: Yes    Alcohol/week: 7.0 standard drinks of alcohol    Types: 7 Glasses of wine per week     Comment: 0-2 a night   Drug use: No   Sexual activity: Not Currently  Other Topics Concern   Not on file  Social History Narrative   Pt has a child who passed away at age 91   Social Determinants of Health   Financial Resource Strain: Low Risk  (01/11/2023)   Overall Financial Resource Strain (CARDIA)    Difficulty of Paying Living Expenses: Not hard at all  Food Insecurity: No Food Insecurity (01/11/2023)   Hunger Vital Sign    Worried About Running Out of Food in the Last Year: Never true    Ran Out of Food in the Last Year: Never true  Transportation Needs: No Transportation Needs (01/11/2023)   PRAPARE - Administrator, Civil Service (Medical): No    Lack of Transportation (Non-Medical): No  Physical Activity: Insufficiently Active (01/11/2023)   Exercise Vital Sign    Days of Exercise per Week: 2 days    Minutes of Exercise per Session: 10 min  Stress: No Stress Concern Present (01/11/2023)   [employer] of Occupational Health - Occupational Stress Questionnaire    Feeling of Stress : Not at all  Social Connections: Moderately Integrated (01/11/2023)   Social Connection and Isolation Panel [NHANES]    Frequency of Communication with Friends and Family: More than three times a week    Frequency of Social Gatherings with Friends and Family: More than three times a week    Attends Religious Services: More than 4 times per year    Active Member of [employer] or Organizations: Yes    Attends Engineer, structural: More than 4 times per year    Marital Status: Divorced    Tobacco Counseling Counseling given: Not Answered   Clinical Intake:  Pre-visit preparation completed: Yes  Pain : No/denies pain     BMI - recorded: 34.26 Nutritional Status: BMI > 30  Obese Nutritional Risks: None Diabetes: No  How often do you need to have someone help you when you read instructions, pamphlets, or other written materials from your doctor or pharmacy?: 1 -  Never  Interpreter Needed?: No  Comments: lives alone Information entered by ::  B.Reed Dady,LPN   Activities of Daily Living    01/11/2023   11:26 AM 08/11/2022   10:24 AM  In your present state of health, do you have any difficulty performing the following activities:  Hearing? 1 0  Vision? 0 0  Difficulty concentrating or making decisions? 0 0  Walking or climbing stairs? 1 1  Dressing or bathing? 0 0  Doing errands, shopping? 1 0  Preparing Food and eating ? N   Using the Toilet? N   In the past six months, have you accidently leaked urine? Y   Comment due to taking Lasix   Do you have problems with loss of bowel control? N   Managing your Medications? N   Managing your Finances? N   Housekeeping or managing your Housekeeping? Y   Comment children help     Patient Care Team: Beryle Flock Marzella Schlein, MD as PCP - General (Family Medicine) Antonieta Iba, MD as PCP - Cardiology (Cardiology) Lanier Prude, MD as PCP - Electrophysiology (Cardiology) Merri Ray, MD as Referring Physician (Physical Medicine and Rehabilitation) [place], [facility] Ardeen Fillers, Luis Abed, MD as Consulting Physician (Pulmonary Disease) Dasher, Cliffton Asters, MD (Dermatology) Thomasene Lot, DO as Referring Physician (Family Medicine) Gaspar Cola, [facility] (Inactive) as Pharmacist (Pharmacist)  Indicate any recent Medical Services you may have received from other than [facility] providers in the past year (date may be approximate).     Assessment:   This is a routine wellness examination for Lauren Lloyd.  Hearing/Vision screen Hearing Screening - Comments:: Inadequate hearing:ENT appt tomorrow Hearing decreasing in left ear Vision Screening - Comments:: Adequate vision  [facility]  Dietary issues and exercise activities discussed:     Goals Addressed             This Visit's Progress    Reduce portion size   On track    Recommend decreasing portion sizes for each meal (3) and  add in 2 healthy snacks in between.      Track and Manage Fluids and Swelling-Heart Failure   On track    Timeframe:  Long-Range Goal Priority:  High Start Date: 08/13/21                            Expected End Date: 08/14/22                      Follow Up within 90 days   - call office if I gain more than 2 pounds in one day or 5 pounds in one week - use salt in moderation - watch for swelling in feet, ankles and legs every day - weigh myself daily    Why is this important?   It is important to check your weight daily and watch how much salt and liquids you have.  It will help you to manage your heart failure.    Notes:       Depression Screen    01/11/2023   11:21 AM 10/03/2022   11:15 AM 08/11/2022   10:24 AM 08/03/2022    2:41 PM 06/09/2022    9:25 AM 05/20/2022    9:37 AM 03/17/2022    8:29 AM  PHQ 2/9 Scores  PHQ - 2 Score 0 0 0 0 0 0 0  PHQ- 9 Score  5 5 3 4 4 3     Fall Risk    01/11/2023   11:13 AM 10/03/2022  11:15 AM 08/11/2022   10:24 AM 08/03/2022    2:41 PM 06/09/2022    9:26 AM  Fall Risk   Falls in the past year?  0 0 0 0  Number falls in past yr:  0 0 0 0  Injury with Fall?  0 0 0 0  Risk for fall due to : No Fall Risks No Fall Risks No Fall Risks No Fall Risks   Follow up Falls prevention discussed;Education provided Falls evaluation completed Falls evaluation completed Falls evaluation completed     MEDICARE RISK AT HOME: Medicare Risk at Home Any stairs in or around the home?: Yes If so, are there any without handrails?: Yes Home free of loose throw rugs in walkways, pet beds, electrical cords, etc?: Yes Adequate lighting in your home to reduce risk of falls?: Yes Life alert?: No Use of a cane, walker or w/c?: No Grab bars in the bathroom?: Yes Shower chair or bench in shower?: Yes Elevated toilet seat or a handicapped toilet?: Yes  TIMED UP AND GO:  Was the test performed?  No    Cognitive Function:        01/11/2023   11:29 AM 02/08/2021     9:11 AM 02/03/2020    8:45 AM 01/03/2019   10:10 AM 12/01/2016    9:45 AM  6CIT Screen  What Year? 0 points 0 points 0 points 0 points 0 points  What month? 0 points 0 points 0 points 0 points 0 points  What time? 0 points 0 points 0 points 0 points 0 points  Count back from 20 0 points 0 points 0 points 0 points 0 points  Months in reverse 0 points 0 points 0 points 0 points 0 points  Repeat phrase 0 points 0 points 0 points 2 points 0 points  Total Score 0 points 0 points 0 points 2 points 0 points    Immunizations Immunization History  Administered Date(s) Administered   Hepatitis A 11/25/1999, 09/19/2001   Hepatitis A, Adult 11/25/1999, 09/19/2001   IPV 02/11/2000   PFIZER(Purple Top)SARS-COV-2 Vaccination 06/14/2019, 07/05/2019, 04/15/2020   Pneumococcal Conjugate-13 10/06/2014   Pneumococcal Polysaccharide-23 12/01/2010   Td 02/02/1998   Tdap 09/11/2007, 02/27/2012   Typhoid Inactivated 02/11/2000   Zoster Recombinant(Shingrix) 02/06/2020, 05/29/2020    TDAP status: Up to date  Flu Vaccine status: Declined, Education has been provided regarding the importance of this vaccine but patient still declined. Advised may receive this vaccine at local pharmacy or Health Dept. Aware to provide a copy of the vaccination record if obtained from local pharmacy or Health Dept. Verbalized acceptance and understanding.  Pneumococcal vaccine status: Up to date  Covid-19 vaccine status: Completed vaccines  Qualifies for Shingles Vaccine? Yes   Zostavax completed Yes   Shingrix Completed?: Yes  Screening Tests Health Maintenance  Topic Date Due   COVID-19 Vaccine (4 - 2023-24 season) 01/14/2022   DTaP/Tdap/Td (4 - Td or Tdap) 02/26/2022   INFLUENZA VACCINE  12/15/2022   Medicare Annual Wellness (AWV)  01/11/2024   Pneumonia Vaccine 51+ Years old  Completed   DEXA SCAN  Completed   Zoster Vaccines- Shingrix  Completed   HPV VACCINES  Aged Out    Health Maintenance  Health  Maintenance Due  Topic Date Due   COVID-19 Vaccine (4 - 2023-24 season) 01/14/2022   DTaP/Tdap/Td (4 - Td or Tdap) 02/26/2022   INFLUENZA VACCINE  12/15/2022    Colorectal cancer screening: No longer required.   Mammogram status: No  longer required due to age.  Bone Density status: Completed yes. Results reflect: Bone density results: NORMAL. Repeat every 5 years.  Lung Cancer Screening: (Low Dose CT Chest recommended if Age 6-80 years, 20 pack-year currently smoking OR have quit w/in 15years.) does not qualify.   Lung Cancer Screening Referral: no  Additional Screening:  Hepatitis C Screening: does not qualify; Completed yes  Vision Screening: Recommended annual ophthalmology exams for early detection of glaucoma and other disorders of the eye. Is the patient up to date with their annual eye exam?  Yes  Who is the provider or what is the name of the office in which the patient attends annual eye exams? [facility] If pt is not established with a provider, would they like to be referred to a provider to establish care? No .   Dental Screening: Recommended annual dental exams for proper oral hygiene  Diabetic Foot Exam: n/a  Community Resource Referral / Chronic Care Management: CRR required this visit?  No   CCM required this visit?  No    Plan:     I have personally reviewed and noted the following in the patient's chart:   Medical and social history Use of alcohol, tobacco or illicit drugs  Current medications and supplements including opioid prescriptions. Patient is not currently taking opioid prescriptions. Functional ability and status Nutritional status Physical activity Advanced directives List of other physicians Hospitalizations, surgeries, and ER visits in previous 12 months Vitals Screenings to include cognitive, depression, and falls Referrals and appointments  In addition, I have reviewed and discussed with patient certain preventive protocols,  quality metrics, and best practice recommendations. A written personalized care plan for preventive services as well as general preventive health recommendations were provided to patient.     Sue Lush, LPN   1/61/0960   After Visit Summary: (MyChart) Due to this being a telephonic visit, the after visit summary with patients personalized plan was offered to patient via MyChart   Nurse Notes: The patient states she is doing well and has no concerns or questions at this time.

## 2023-01-11 NOTE — Patient Instructions (Signed)
Lauren Lloyd , Thank you for taking time to come for your Medicare Wellness Visit. I appreciate your ongoing commitment to your health goals. Please review the following plan we discussed and let me know if I can assist you in the future.   Referrals/Orders/Follow-Ups/Clinician Recommendations: none  This is a list of the screening recommended for you and due dates:  Health Maintenance  Topic Date Due   COVID-19 Vaccine (4 - 2023-24 season) 01/14/2022   DTaP/Tdap/Td vaccine (4 - Td or Tdap) 02/26/2022   Flu Shot  12/15/2022   Medicare Annual Wellness Visit  01/11/2024   Pneumonia Vaccine  Completed   DEXA scan (bone density measurement)  Completed   Zoster (Shingles) Vaccine  Completed   HPV Vaccine  Aged Out    Advanced directives: (Copy Requested) Please bring a copy of your health care power of attorney and living will to the office to be added to your chart at your convenience.  Next Medicare Annual Wellness Visit scheduled for next year: Yes 01/17/2024 @ 9:15am telephone

## 2023-01-12 DIAGNOSIS — H6983 Other specified disorders of Eustachian tube, bilateral: Secondary | ICD-10-CM | POA: Diagnosis not present

## 2023-01-12 DIAGNOSIS — H903 Sensorineural hearing loss, bilateral: Secondary | ICD-10-CM | POA: Diagnosis not present

## 2023-01-25 ENCOUNTER — Ambulatory Visit: Payer: Medicare HMO | Attending: Cardiovascular Disease

## 2023-01-25 ENCOUNTER — Other Ambulatory Visit: Payer: Self-pay

## 2023-01-25 ENCOUNTER — Encounter: Payer: Medicare HMO | Attending: Cardiovascular Disease

## 2023-01-25 DIAGNOSIS — I495 Sick sinus syndrome: Secondary | ICD-10-CM | POA: Diagnosis not present

## 2023-01-25 DIAGNOSIS — I5032 Chronic diastolic (congestive) heart failure: Secondary | ICD-10-CM | POA: Insufficient documentation

## 2023-01-25 DIAGNOSIS — Z5189 Encounter for other specified aftercare: Secondary | ICD-10-CM | POA: Insufficient documentation

## 2023-01-25 LAB — CUP PACEART REMOTE DEVICE CHECK
Battery Remaining Longevity: 150 mo
Battery Remaining Percentage: 100 %
Brady Statistic RA Percent Paced: 0 %
Brady Statistic RV Percent Paced: 96 %
Date Time Interrogation Session: 20240911041100
Implantable Lead Connection Status: 753985
Implantable Lead Connection Status: 753985
Implantable Lead Implant Date: 20230612
Implantable Lead Implant Date: 20230612
Implantable Lead Location: 753859
Implantable Lead Location: 753860
Implantable Lead Model: 7841
Implantable Lead Model: 7842
Implantable Lead Serial Number: 1182863
Implantable Lead Serial Number: 1274488
Implantable Pulse Generator Implant Date: 20230612
Lead Channel Impedance Value: 529 Ohm
Lead Channel Impedance Value: 640 Ohm
Lead Channel Pacing Threshold Amplitude: 0.9 V
Lead Channel Pacing Threshold Pulse Width: 0.4 ms
Lead Channel Setting Pacing Amplitude: 1.4 V
Lead Channel Setting Pacing Pulse Width: 0.4 ms
Lead Channel Setting Sensing Sensitivity: 3.5 mV
Pulse Gen Serial Number: 111954
Zone Setting Status: 755011

## 2023-01-25 NOTE — Progress Notes (Signed)
Virtual Visit completed. Patient informed on EP and RD appointment and 6 Minute walk test. Patient also informed of patient health questionnaires on My Chart. Patient Verbalizes understanding. Visit diagnosis can be found in [facility] 01/03/2023.

## 2023-01-31 ENCOUNTER — Telehealth: Payer: Self-pay

## 2023-01-31 ENCOUNTER — Ambulatory Visit (INDEPENDENT_AMBULATORY_CARE_PROVIDER_SITE_OTHER): Payer: Medicare HMO | Admitting: Physician Assistant

## 2023-01-31 VITALS — BP 156/93 | HR 68 | Temp 100.8°F | Ht 67.5 in | Wt 220.0 lb

## 2023-01-31 DIAGNOSIS — R0602 Shortness of breath: Secondary | ICD-10-CM | POA: Diagnosis not present

## 2023-01-31 DIAGNOSIS — R051 Acute cough: Secondary | ICD-10-CM

## 2023-01-31 DIAGNOSIS — U071 COVID-19: Secondary | ICD-10-CM | POA: Diagnosis not present

## 2023-01-31 LAB — POC COVID19 BINAXNOW: SARS Coronavirus 2 Ag: POSITIVE — AB

## 2023-01-31 MED ORDER — NIRMATRELVIR/RITONAVIR (PAXLOVID) TABLET (RENAL DOSING)
2.0000 | ORAL_TABLET | Freq: Two times a day (BID) | ORAL | 0 refills | Status: AC
Start: 2023-01-31 — End: 2023-02-05

## 2023-01-31 NOTE — Progress Notes (Unsigned)
Established patient visit  Patient: Lauren Lloyd   DOB: [birth year]   82 y.o. Female  MRN: [id number] Visit Date: 01/31/2023  Today's healthcare provider: Debera Lat, PA-C   Chief Complaint  Patient presents with   URI    Patient was hospitalized last year with RSV.  Symptoms dramatically worsened since yesterday.      Subjective    HPI HPI     URI   Associated symptoms inlclude achiness, congestion, chills, cough, diarrhea, headache, night sweats, rhinorrhea, shortness of breath, sneezing, sore throat and wheezing.  Recent episode started in the past 7 days.  The problem has been rapidly worsening since onset.  The maximum temperature recorded prior to his arrival was 100.4-100.9 F.  The fever has been present for  1 to 2 days.  Patient  is drinking plenty of fluids.  Patient is not a smoker. Additional comments: Patient was hospitalized last year with RSV.  Symptoms dramatically worsened since yesterday.         Last edited by Adline Peals, CMA on 01/31/2023 11:16 AM.      *** Discussed the use of AI scribe software for clinical note transcription with the patient, who gave verbal consent to proceed.  History of Present Illness               01/11/2023   11:21 AM 10/03/2022   11:15 AM 08/11/2022   10:24 AM  Depression screen PHQ 2/9  Decreased Interest 0 0 0  Down, Depressed, Hopeless 0 0 0  PHQ - 2 Score 0 0 0  Altered sleeping  1 1  Tired, decreased energy  2 2  Change in appetite  2 2  Feeling bad or failure about yourself   0 0  Trouble concentrating  0 0  Moving slowly or fidgety/restless  0 0  Suicidal thoughts  0 0  PHQ-9 Score  5 5  Difficult doing work/chores  Not difficult at all Not difficult at all       No data to display          Medications: Outpatient Medications Prior to Visit  Medication Sig   acetaminophen (TYLENOL) 325 MG tablet Take 650 mg by mouth every 6 (six) hours as needed for moderate pain.   allopurinol (ZYLOPRIM) 100  MG tablet Take 1 tablet (100 mg total) by mouth daily.   bisoprolol (ZEBETA) 10 MG tablet Take 1 tablet (10 mg total) by mouth 2 (two) times daily.   calcium carbonate (TUMS EX) 750 MG chewable tablet Chew 2 tablets by mouth daily as needed for heartburn.   Cholecalciferol 25 MCG (1000 UT) tablet Take 1,000 Units by mouth daily.   ELIQUIS 5 MG TABS tablet TAKE ONE TABLET TWICE DAILY   ezetimibe (ZETIA) 10 MG tablet TAKE 1 TABLET BY MOUTH DAILY   furosemide (LASIX) 40 MG tablet TAKE 1 TABLET BY MOUTH DAILY. TAKE AN EXTRA TABLET AS NEEDED AFTER LUNCH FOR ABDOMINAL SWELLING, LEG SWELLING OR SHORTNESS OF BREATH   gabapentin (NEURONTIN) 600 MG tablet TAKE ONE TABLET BY MOUTH AT BEDTIME   levalbuterol (XOPENEX HFA) 45 MCG/ACT inhaler Inhale 2 puffs into the lungs every 8 (eight) hours as needed for wheezing.   Menthol, Topical Analgesic, (BIOFREEZE EX) Apply 1 application. topically daily as needed (Neck pain).   metaxalone (SKELAXIN) 800 MG tablet Take 800 mg by mouth daily as needed for muscle spasms.   Multiple Vitamin (MULTIVITAMIN) capsule Take 1 capsule by mouth daily.   mupirocin  ointment (BACTROBAN) 2 % Place 1 application  into the nose 2 (two) times daily.   potassium chloride (KLOR-CON) 10 MEQ tablet TAKE 1 TABLET BY MOUTH DAILY   Tiotropium Bromide-Olodaterol (STIOLTO RESPIMAT) 2.5-2.5 MCG/ACT AERS Inhale 2 puffs into the lungs daily.   colchicine 0.6 MG tablet Take 2 tablets once, repeat with 1 tablet 12 hours following. Continue with 1 tablet every 12 hours until resolved. (Patient not taking: Reported on 01/25/2023)   naltrexone (DEPADE) 50 MG tablet TAKE 1/2 TABLET BY MOUTH DAILY (Patient not taking: Reported on 01/25/2023)   No facility-administered medications prior to visit.    Review of Systems Except see HPI   {Insert previous labs (optional):23779} {See past labs  Heme  Chem  Endocrine  Serology  Results Review (optional):1}   Objective    BP (!) 156/93   Pulse 68    Temp (!) 100.8 F (38.2 C) (Oral)   Ht 5' 7.5" (1.715 m)   Wt 220 lb (99.8 kg)   SpO2 94%   BMI 33.95 kg/m  {Insert last BP/Wt (optional):23777}{See vitals history (optional):1}   Physical Exam   No results found for any visits on 01/31/23.  Assessment & Plan    *** Assessment and Plan              No follow-ups on file.      [facility]

## 2023-01-31 NOTE — Telephone Encounter (Signed)
Copied from CRM [id number]. Topic: General - Other >> Jan 31, 2023  3:42 PM Ja-Kwan M wrote: Reason for CRM: Pt stated she came in today and she was diagnosed with Covid but the Rx has not been received by her pharmacy. Pt requesting that the Rx be sent asap to [facility] - [place], [place] - [address]  Phone: [phone]  Fax: [phone]

## 2023-02-01 ENCOUNTER — Ambulatory Visit: Payer: Medicare HMO

## 2023-02-01 ENCOUNTER — Encounter: Payer: Self-pay | Admitting: Physician Assistant

## 2023-02-01 ENCOUNTER — Ambulatory Visit: Payer: Self-pay | Admitting: *Deleted

## 2023-02-01 DIAGNOSIS — U071 COVID-19: Secondary | ICD-10-CM | POA: Insufficient documentation

## 2023-02-01 NOTE — Telephone Encounter (Signed)
Reason for Disposition  [1] Longstanding difficulty breathing (e.g., CHF, COPD, emphysema) AND [2] WORSE than normal    Patient was seen yesterday- diagnosed with COVID  Answer Assessment - Initial Assessment Questions 1. RESPIRATORY STATUS: "Describe your breathing?" (e.g., wheezing, shortness of breath, unable to speak, severe coughing)      SOB 2. ONSET: "When did this breathing problem begin?"      Hx breathing issue 6 months- COVID new, patient is afraid of RSV 3. PATTERN "Does the difficult breathing come and go, or has it been constant since it started?"      Constant- patient states night time is bad- hard to catch breath 4. SEVERITY: "How bad is your breathing?" (e.g., mild, moderate, severe)    - MILD: No SOB at rest, mild SOB with walking, speaks normally in sentences, can lie down, no retractions, pulse < 100.    - MODERATE: SOB at rest, SOB with minimal exertion and prefers to sit, cannot lie down flat, speaks in phrases, mild retractions, audible wheezing, pulse 100-120.    - SEVERE: Very SOB at rest, speaks in single words, struggling to breathe, sitting hunched forward, retractions, pulse > 120      moderate 5. RECURRENT SYMPTOM: "Have you had difficulty breathing before?" If Yes, ask: "When was the last time?" and "What happened that time?"      yes 6. CARDIAC HISTORY: "Do you have any history of heart disease?" (e.g., heart attack, angina, bypass surgery, angioplasty)      Afib- pace maker 7. LUNG HISTORY: "Do you have any history of lung disease?"  (e.g., pulmonary embolus, asthma, emphysema)     Yes- was supposed to have pulmonary rehab 8. CAUSE: "What do you think is causing the breathing problem?"      COVID +- yesterday 9. OTHER SYMPTOMS: "Do you have any other symptoms? (e.g., dizziness, runny nose, cough, chest pain, fever)     Diaphragm sore from coughing, not sleeping 10. O2 SATURATION MONITOR:  "Do you use an oxygen saturation monitor (pulse oximeter) at home?"  If Yes, ask: "What is your reading (oxygen level) today?" "What is your usual oxygen saturation reading?" (e.g., 95%)       Patient has machine and she will check  Protocols used: Breathing Difficulty-A-AH

## 2023-02-01 NOTE — Telephone Encounter (Signed)
  Chief Complaint: SOB- COVID diagnosis yesterday Symptoms: patient reports she had bad night - trouble breathing- patient started her antiviral yesterday- does have tessalon pearls for cough and using ibuprofen for pain. Frequency: diagnosed yesterday  Disposition: [] ED /[] Urgent Care (no appt availability in office) / [] Appointment(In office/virtual)/ []  [facility]/ [] Home Care/ [] Refused Recommended Disposition /[] [facility]/ [x]  Follow-up with PCP Additional Notes: Patient has follow up questions: Metallic taste in mouth- SE of antiviral- yes  What can she take for muscle pain- diaphragm pain due to cough- using ibuprofen now Patient states she has O2 sat monitor- but has not used it recently- will start monitoring today- she does need parameters for COVID- advised 90% is ED. Patient advised not to sleep flat- she may breath better upright/inclined Cough- she has tessalon pearls and will use them

## 2023-02-03 ENCOUNTER — Ambulatory Visit: Payer: Self-pay | Admitting: *Deleted

## 2023-02-03 NOTE — Telephone Encounter (Signed)
  Chief Complaint: Informational call- retesting questions Symptoms: much better- using antiviral   Disposition: [] ED /[] Urgent Care (no appt availability in office) / [] Appointment(In office/virtual)/ []  [facility]/ [x] Home Care/ [] Refused Recommended Disposition /[] [facility]/ []  Follow-up with PCP Additional Notes: Patient has questions about isolation and retesting: Advised no retest for negative needed Isolation/mask protocol reviewed

## 2023-02-03 NOTE — Telephone Encounter (Signed)
Summary: Covid question   Please call Ms. Ringuette she Just tested 9/17 and wanted to get tested again because she has commitments next week. Questioning if results would read differently.  Please advise         Reason for Disposition  COVID-19 Testing, questions about  Answer Assessment - Initial Assessment Questions 1. COVID-19 DIAGNOSIS: "How do you know that you have COVID?" (e.g., positive lab test or self-test, diagnosed by doctor or NP/PA, symptoms after exposure).     01/31/23- + COVID test    8. BETTER-SAME-WORSE: "Are you getting better, staying the same or getting worse compared to yesterday?"  If getting worse, ask, "In what way?"     Much better!  Protocols used: Coronavirus (COVID-19) Diagnosed or Suspected-A-AH

## 2023-02-10 NOTE — Progress Notes (Signed)
Remote pacemaker transmission.   

## 2023-02-13 VITALS — Ht 66.2 in | Wt 216.1 lb

## 2023-02-13 DIAGNOSIS — Z5189 Encounter for other specified aftercare: Secondary | ICD-10-CM | POA: Diagnosis not present

## 2023-02-13 DIAGNOSIS — I5032 Chronic diastolic (congestive) heart failure: Secondary | ICD-10-CM | POA: Diagnosis not present

## 2023-02-13 NOTE — Patient Instructions (Addendum)
Patient Instructions  Patient Details  Name: Lauren Lloyd MRN: [id number] Date of Birth: 1940-10-16 Referring Provider:  Antonieta Iba, MD  Below are your personal goals for exercise, nutrition, and risk factors. Our goal is to help you stay on track towards obtaining and maintaining these goals. We will be discussing your progress on these goals with you throughout the program.  Initial Exercise Prescription:  Initial Exercise Prescription - 02/13/23 1600       Date of Initial Exercise RX and Referring Provider   Date 02/13/23    Referring Provider Julien Nordmann, MD      Oxygen   Maintain Oxygen Saturation 88% or higher      Recumbant Bike   Level 1    RPM 50    Watts 14    Minutes 15    METs 1.54      NuStep   Level 1    SPM 80    Minutes 15    METs 1.54      Biostep-RELP   Level 1    SPM 50    Minutes 15    METs 1.54      Track   Laps 8    Minutes 15    METs 1.44      Prescription Details   Frequency (times per week) 2    Duration Progress to 30 minutes of continuous aerobic without signs/symptoms of physical distress      Intensity   THRR 40-80% of Max Heartrate 96-123    Ratings of Perceived Exertion 11-13    Perceived Dyspnea 0-4      Progression   Progression Continue to progress workloads to maintain intensity without signs/symptoms of physical distress.      Resistance Training   Training Prescription Yes    Weight 5lb    Reps 10-15             Exercise Goals: Frequency: Be able to perform aerobic exercise two to three times per week in program working toward 2-5 days per week of home exercise.  Intensity: Work with a perceived exertion of 11 (fairly light) - 15 (hard) while following your exercise prescription.  We will make changes to your prescription with you as you progress through the program.   Duration: Be able to do 30 to 45 minutes of continuous aerobic exercise in addition to a 5 minute warm-up and a 5 minute cool-down  routine.   Nutrition Goals: Your personal nutrition goals will be established when you do your nutrition analysis with the dietician.  The following are general nutrition guidelines to follow: Cholesterol < 200mg /day Sodium < 1500mg /day Fiber: Women over 50 yrs - 21 grams per day  Personal Goals:  Personal Goals and Risk Factors at Admission - 02/13/23 1655       Core Components/Risk Factors/Patient Goals on Admission    Weight Management Yes;Weight Loss    Intervention Weight Management: Develop a combined nutrition and exercise program designed to reach desired caloric intake, while maintaining appropriate intake of nutrient and fiber, sodium and fats, and appropriate energy expenditure required for the weight goal.;Weight Management: Provide education and appropriate resources to help participant work on and attain dietary goals.;Weight Management/Obesity: Establish reasonable short term and long term weight goals.;Obesity: Provide education and appropriate resources to help participant work on and attain dietary goals.    Admit Weight 216 lb 1.6 oz (98 kg)    Goal Weight: Short Term 206 lb 1.6 oz (93.5 kg)  Goal Weight: Long Term 196 lb 1.6 oz (89 kg)    Expected Outcomes Short Term: Continue to assess and modify interventions until short term weight is achieved;Long Term: Adherence to nutrition and physical activity/exercise program aimed toward attainment of established weight goal;Weight Maintenance: Understanding of the daily nutrition guidelines, which includes 25-35% calories from fat, 7% or less cal from saturated fats, less than 200mg  cholesterol, less than 1.5gm of sodium, & 5 or more servings of fruits and vegetables daily;Weight Loss: Understanding of general recommendations for a balanced deficit meal plan, which promotes 1-2 lb weight loss per week and includes a negative energy balance of [phone] kcal/d;Understanding recommendations for meals to include 15-35% energy as  protein, 25-35% energy from fat, 35-60% energy from carbohydrates, less than 200mg  of dietary cholesterol, 20-35 gm of total fiber daily;Understanding of distribution of calorie intake throughout the day with the consumption of 4-5 meals/snacks    Improve shortness of breath with ADL's Yes    Intervention Provide education, individualized exercise plan and daily activity instruction to help decrease symptoms of SOB with activities of daily living.    Expected Outcomes Short Term: Improve cardiorespiratory fitness to achieve a reduction of symptoms when performing ADLs;Long Term: Be able to perform more ADLs without symptoms or delay the onset of symptoms    Heart Failure Yes    Intervention Provide a combined exercise and nutrition program that is supplemented with education, support and counseling about heart failure. Directed toward relieving symptoms such as shortness of breath, decreased exercise tolerance, and extremity edema.    Expected Outcomes Improve functional capacity of life;Short term: Attendance in program 2-3 days a week with increased exercise capacity. Reported lower sodium intake. Reported increased fruit and vegetable intake. Reports medication compliance.;Short term: Daily weights obtained and reported for increase. Utilizing diuretic protocols set by physician.;Long term: Adoption of self-care skills and reduction of barriers for early signs and symptoms recognition and intervention leading to self-care maintenance.    Hypertension Yes    Intervention Provide education on lifestyle modifcations including regular physical activity/exercise, weight management, moderate sodium restriction and increased consumption of fresh fruit, vegetables, and low fat dairy, alcohol moderation, and smoking cessation.;Monitor prescription use compliance.    Expected Outcomes Short Term: Continued assessment and intervention until BP is < 140/58mm HG in hypertensive participants. < 130/32mm HG in  hypertensive participants with diabetes, heart failure or chronic kidney disease.;Long Term: Maintenance of blood pressure at goal levels.             Tobacco Use Initial Evaluation: Social History   Tobacco Use  Smoking Status Former   Current packs/day: 0.00   Average packs/day: 1 pack/day for 30.0 years (30.0 ttl pk-yrs)   Types: Cigarettes   Start date: 05/17/1959   Quit date: 05/16/1989   Years since quitting: 33.7   Passive exposure: Past  Smokeless Tobacco Never    Exercise Goals and Review:  Exercise Goals     Row Name 02/13/23 1644             Exercise Goals   Increase Physical Activity Yes       Intervention Provide advice, education, support and counseling about physical activity/exercise needs.;Develop an individualized exercise prescription for aerobic and resistive training based on initial evaluation findings, risk stratification, comorbidities and participant's personal goals.       Expected Outcomes Short Term: Attend rehab on a regular basis to increase amount of physical activity.;Long Term: Exercising regularly at least 3-5 days a week.;Long  Term: Add in home exercise to make exercise part of routine and to increase amount of physical activity.       Increase Strength and Stamina Yes       Intervention Provide advice, education, support and counseling about physical activity/exercise needs.;Develop an individualized exercise prescription for aerobic and resistive training based on initial evaluation findings, risk stratification, comorbidities and participant's personal goals.       Expected Outcomes Short Term: Increase workloads from initial exercise prescription for resistance, speed, and METs.;Short Term: Perform resistance training exercises routinely during rehab and add in resistance training at home;Long Term: Improve cardiorespiratory fitness, muscular endurance and strength as measured by increased METs and functional capacity ( )       Able to  understand and use rate of perceived exertion (RPE) scale Yes       Intervention Provide education and explanation on how to use RPE scale       Expected Outcomes Short Term: Able to use RPE daily in rehab to express subjective intensity level;Long Term:  Able to use RPE to guide intensity level when exercising independently       Able to understand and use Dyspnea scale Yes       Intervention Provide education and explanation on how to use Dyspnea scale       Expected Outcomes Short Term: Able to use Dyspnea scale daily in rehab to express subjective sense of shortness of breath during exertion;Long Term: Able to use Dyspnea scale to guide intensity level when exercising independently       Knowledge and understanding of Target Heart Rate Range (THRR) Yes       Intervention Provide education and explanation of THRR including how the numbers were predicted and where they are located for reference       Expected Outcomes Short Term: Able to state/look up THRR;Long Term: Able to use THRR to govern intensity when exercising independently;Short Term: Able to use daily as guideline for intensity in rehab       Able to check pulse independently Yes       Intervention Provide education and demonstration on how to check pulse in carotid and radial arteries.;Review the importance of being able to check your own pulse for safety during independent exercise       Expected Outcomes Short Term: Able to explain why pulse checking is important during independent exercise;Long Term: Able to check pulse independently and accurately       Understanding of Exercise Prescription Yes       Intervention Provide education, explanation, and written materials on patient's individual exercise prescription       Expected Outcomes Short Term: Able to explain program exercise prescription;Long Term: Able to explain home exercise prescription to exercise independently                Copy of goals given to participant.

## 2023-02-13 NOTE — Progress Notes (Signed)
Pulmonary Individual Treatment Plan  Patient Details  Name: Lauren Lloyd MRN: [id number] Date of Birth: [birth year] Referring Provider:   Flowsheet Row Pulmonary Rehab from 02/13/2023 in [facility] Cardiac and Pulmonary Rehab  Referring Provider Julien Nordmann, MD       Initial Encounter Date:  Flowsheet Row Pulmonary Rehab from 02/13/2023 in [facility] Cardiac and Pulmonary Rehab  Date 02/13/23       Visit Diagnosis: Heart failure, diastolic, chronic (HCC)  Patient's Home Medications on Admission:  Current Outpatient Medications:    acetaminophen (TYLENOL) 325 MG tablet, Take 650 mg by mouth every 6 (six) hours as needed for moderate pain., Disp: , Rfl:    allopurinol (ZYLOPRIM) 100 MG tablet, Take 1 tablet (100 mg total) by mouth daily., Disp: 30 tablet, Rfl: 6   bisoprolol (ZEBETA) 10 MG tablet, Take 1 tablet (10 mg total) by mouth 2 (two) times daily., Disp: 180 tablet, Rfl: 3   calcium carbonate (TUMS EX) 750 MG chewable tablet, Chew 2 tablets by mouth daily as needed for heartburn., Disp: , Rfl:    Cholecalciferol 25 MCG (1000 UT) tablet, Take 1,000 Units by mouth daily., Disp: , Rfl:    ELIQUIS 5 MG TABS tablet, TAKE ONE TABLET TWICE DAILY, Disp: 180 tablet, Rfl: 1   ezetimibe (ZETIA) 10 MG tablet, TAKE 1 TABLET BY MOUTH DAILY, Disp: 90 tablet, Rfl: 3   furosemide (LASIX) 40 MG tablet, TAKE 1 TABLET BY MOUTH DAILY. TAKE AN EXTRA TABLET AS NEEDED AFTER LUNCH FOR ABDOMINAL SWELLING, LEG SWELLING OR SHORTNESS OF BREATH, Disp: 180 tablet, Rfl: 1   gabapentin (NEURONTIN) 600 MG tablet, TAKE ONE TABLET BY MOUTH AT BEDTIME, Disp: 90 tablet, Rfl: 0   levalbuterol (XOPENEX HFA) 45 MCG/ACT inhaler, Inhale 2 puffs into the lungs every 8 (eight) hours as needed for wheezing., Disp: 1 each, Rfl: 2   Menthol, Topical Analgesic, (BIOFREEZE EX), Apply 1 application. topically daily as needed (Neck pain)., Disp: , Rfl:    metaxalone (SKELAXIN) 800 MG tablet, Take 800 mg by mouth daily as needed for muscle  spasms., Disp: , Rfl:    Multiple Vitamin (MULTIVITAMIN) capsule, Take 1 capsule by mouth daily., Disp: , Rfl:    mupirocin ointment (BACTROBAN) 2 %, Place 1 application  into the nose 2 (two) times daily., Disp: 22 g, Rfl: 0   naltrexone (DEPADE) 50 MG tablet, TAKE 1/2 TABLET BY MOUTH DAILY (Patient not taking: Reported on 01/25/2023), Disp: 15 tablet, Rfl: 2   potassium chloride (KLOR-CON) 10 MEQ tablet, TAKE 1 TABLET BY MOUTH DAILY, Disp: 30 tablet, Rfl: 0   Tiotropium Bromide-Olodaterol (STIOLTO RESPIMAT) 2.5-2.5 MCG/ACT AERS, Inhale 2 puffs into the lungs daily., Disp: 4 g, Rfl: 0  Past Medical History: Past Medical History:  Diagnosis Date   (HFpEF) heart failure with preserved ejection fraction (HCC)    a. 05/2018 Echo: EF 55-60%, no rwma, mild to mod MR. Nl RV fxn. Mod TR. PASP .   Arthritis    knees, Hands   Arthritis of knee    Back pain    Carotid arterial disease (HCC)    a. 03/2019 Carotid U/S: <50% bilat ICA stenoses.   Cholelithiasis    a. 10/2018 noted on CT.   Edema, lower extremity    Fatty liver    GERD (gastroesophageal reflux disease)    History of stress test    a. 06/2018 MV: EF 59%, no ischemia/infarct. Low risk.   Knee pain    Lactose intolerance    Mitral  regurgitation    a. 05/2018 Echo: mild to mod MR.   Multinodular goiter    Obesity    OSA (obstructive sleep apnea)    PAF (paroxysmal atrial fibrillation) (HCC)    a.  Diagnosed 12/19; b. 05/2018 s/p DCCV; c. 03/2019 & 05/2019 recurrent AFib-->managed w/ amio load; d. CHADS2VASc = 6 (CHF, HTN, age x 2, vascular disease, female)-->Eliquis & amio 100 qd.   PAH (pulmonary artery hypertension) (HCC)    RSV (acute bronchiolitis due to respiratory syncytial virus) 05/10/2022   Scoliosis    SOB (shortness of breath)    Swallowing difficulty     Tobacco Use: Social History   Tobacco Use  Smoking Status Former   Current packs/day: 0.00   Average packs/day: 1 pack/day for 30.0 years (30.0 ttl pk-yrs)    Types: Cigarettes   Start date: 05/17/1959   Quit date: 05/16/1989   Years since quitting: 33.7   Passive exposure: Past  Smokeless Tobacco Never    Labs: Review Flowsheet  More data exists      Latest Ref Rng & Units 12/09/2019 08/03/2020 08/16/2021 03/17/2022 10/28/2022  Labs for ITP Cardiac and Pulmonary Rehab  Cholestrol 100 - 199 mg/dL 161  096  CANCELED  045  176   LDL (calc) 0 - 99 mg/dL 409  811  - 93  914   HDL-C >39 mg/dL 73  65  CANCELED  52  54   Trlycerides 0 - 149 mg/dL 782  956  CANCELED  94  126   Hemoglobin A1c 4.8 - 5.6 % 5.5  - - 6.0  6.0     Details             Pulmonary Assessment Scores:  Pulmonary Assessment Scores     Row Name 02/13/23 1645         ADL UCSD   ADL Phase Entry     SOB Score total 57     Rest 0     Walk 3     Stairs 4     Bath 1     Dress 1     Shop 1       CAT Score   CAT Score 23       mMRC Score   mMRC Score 2              UCSD: Self-administered rating of dyspnea associated with activities of daily living (ADLs) 6-point scale (0 = "not at all" to 5 = "maximal or unable to do because of breathlessness")  Scoring Scores range from 0 to 120.  Minimally important difference is 5 units  CAT: CAT can identify the health impairment of COPD patients and is better correlated with disease progression.  CAT has a scoring range of zero to 40. The CAT score is classified into four groups of low (less than 10), medium (10 - 20), high (21-30) and very high (31-40) based on the impact level of disease on health status. A CAT score over 10 suggests significant symptoms.  A worsening CAT score could be explained by an exacerbation, poor medication adherence, poor inhaler technique, or progression of COPD or comorbid conditions.  CAT MCID is 2 points  mMRC: mMRC (Modified Medical Research Council) Dyspnea Scale is used to assess the degree of baseline functional disability in patients of respiratory disease due to dyspnea. No minimal  important difference is established. A decrease in score of 1 point or greater is considered a positive change.  Pulmonary Function Assessment:  Pulmonary Function Assessment - 01/25/23 1416       Breath   Shortness of Breath Yes;Limiting activity             Exercise Target Goals: Exercise Program Goal: Individual exercise prescription set using results from initial 6 min walk test and THRR while considering  patient's activity barriers and safety.   Exercise Prescription Goal: Initial exercise prescription builds to 30-45 minutes a day of aerobic activity, 2-3 days per week.  Home exercise guidelines will be given to patient during program as part of exercise prescription that the participant will acknowledge.  Education: Aerobic Exercise: - Group verbal and visual presentation on the components of exercise prescription. Introduces F.I.T.T principle from ACSM for exercise prescriptions.  Reviews F.I.T.T. principles of aerobic exercise including progression. Written material given at graduation.   Education: Resistance Exercise: - Group verbal and visual presentation on the components of exercise prescription. Introduces F.I.T.T principle from ACSM for exercise prescriptions  Reviews F.I.T.T. principles of resistance exercise including progression. Written material given at graduation.    Education: Exercise & Equipment Safety: - Individual verbal instruction and demonstration of equipment use and safety with use of the equipment. Flowsheet Row Pulmonary Rehab from 02/13/2023 in [facility] Cardiac and Pulmonary Rehab  Date 02/13/23  Educator MB  Instruction Review Code 1- Verbalizes Understanding       Education: Exercise Physiology & General Exercise Guidelines: - Group verbal and written instruction with models to review the exercise physiology of the cardiovascular system and associated critical values. Provides general exercise guidelines with specific guidelines to those with  heart or lung disease.  Flowsheet Row Pulmonary Rehab from 02/13/2023 in [facility] Cardiac and Pulmonary Rehab  Education need identified 02/13/23       Education: Flexibility, Balance, Mind/Body Relaxation: - Group verbal and visual presentation with interactive activity on the components of exercise prescription. Introduces F.I.T.T principle from ACSM for exercise prescriptions. Reviews F.I.T.T. principles of flexibility and balance exercise training including progression. Also discusses the mind body connection.  Reviews various relaxation techniques to help reduce and manage stress (i.e. Deep breathing, progressive muscle relaxation, and visualization). Balance handout provided to take home. Written material given at graduation.   Activity Barriers & Risk Stratification:  Activity Barriers & Cardiac Risk Stratification - 02/13/23 1640       Activity Barriers & Cardiac Risk Stratification   Activity Barriers Arthritis;Back Problems;Shortness of Breath             6 Minute Walk:  6 Minute Walk     Row Name 02/13/23 1638         6 Minute Walk   Phase Initial     Distance 945 feet     Walk Time 6 minutes     # of Rest Breaks 0     MPH 1.79     METS 1.54     RPE 15     Perceived Dyspnea  3     VO2 Peak 5.41     Symptoms Yes (comment)     Comments back pain     Resting HR 70 bpm     Resting BP 130/74     Resting Oxygen Saturation  92 %     Exercise Oxygen Saturation  during 6 min walk 91 %     Max Ex. HR 111 bpm     Max Ex. BP 166/72     2 Minute Post BP 150/74  Interval HR   1 Minute HR 103     2 Minute HR 107     3 Minute HR 109     4 Minute HR 111     5 Minute HR 82     6 Minute HR 83     2 Minute Post HR 70     Interval Heart Rate? Yes       Interval Oxygen   Interval Oxygen? Yes     Baseline Oxygen Saturation % 92 %     1 Minute Oxygen Saturation % 91 %     1 Minute Liters of Oxygen 0 L     2 Minute Oxygen Saturation % 92 %     2 Minute Liters of  Oxygen 0 L     3 Minute Oxygen Saturation % 91 %     3 Minute Liters of Oxygen 0 L     4 Minute Oxygen Saturation % 91 %     4 Minute Liters of Oxygen 0 L     5 Minute Oxygen Saturation % 95 %     5 Minute Liters of Oxygen 0 L     6 Minute Oxygen Saturation % 93 %     6 Minute Liters of Oxygen 0 L     2 Minute Post Oxygen Saturation % 96 %     2 Minute Post Liters of Oxygen 0 L             Oxygen Initial Assessment:  Oxygen Initial Assessment - 01/25/23 1415       Home Oxygen   Home Oxygen Device None    Sleep Oxygen Prescription CPAP    Liters per minute --   off and on   Home Exercise Oxygen Prescription None    Home Resting Oxygen Prescription None    Compliance with Home Oxygen Use Yes      Initial 6 min Walk   Oxygen Used None      Program Oxygen Prescription   Program Oxygen Prescription None      Intervention   Short Term Goals To learn and exhibit compliance with exercise, home and travel O2 prescription;To learn and understand importance of monitoring SPO2 with pulse oximeter and demonstrate accurate use of the pulse oximeter.;To learn and understand importance of maintaining oxygen saturations>88%;To learn and demonstrate proper pursed lip breathing techniques or other breathing techniques. ;To learn and demonstrate proper use of respiratory medications    Long  Term Goals Exhibits compliance with exercise, home  and travel O2 prescription;Verbalizes importance of monitoring SPO2 with pulse oximeter and return demonstration;Maintenance of O2 saturations>88%;Exhibits proper breathing techniques, such as pursed lip breathing or other method taught during program session;Compliance with respiratory medication;Demonstrates proper use of MDI's             Oxygen Re-Evaluation:   Oxygen Discharge (Final Oxygen Re-Evaluation):   Initial Exercise Prescription:  Initial Exercise Prescription - 02/13/23 1600       Date of Initial Exercise RX and Referring  Provider   Date 02/13/23    Referring Provider Julien Nordmann, MD      Oxygen   Maintain Oxygen Saturation 88% or higher      Recumbant Bike   Level 1    RPM 50    Watts 14    Minutes 15    METs 1.54      NuStep   Level 1    SPM 80    Minutes 15  METs 1.54      Biostep-RELP   Level 1    SPM 50    Minutes 15    METs 1.54      Track   Laps 8    Minutes 15    METs 1.44      Prescription Details   Frequency (times per week) 2    Duration Progress to 30 minutes of continuous aerobic without signs/symptoms of physical distress      Intensity   THRR 40-80% of Max Heartrate 96-123    Ratings of Perceived Exertion 11-13    Perceived Dyspnea 0-4      Progression   Progression Continue to progress workloads to maintain intensity without signs/symptoms of physical distress.      Resistance Training   Training Prescription Yes    Weight 5lb    Reps 10-15             Perform Capillary Blood Glucose checks as needed.  Exercise Prescription Changes:   Exercise Prescription Changes     Row Name 02/13/23 1600             Response to Exercise   Blood Pressure (Admit) 130/74       Blood Pressure (Exercise) 166/72       Blood Pressure (Exit) 130/74       Heart Rate (Admit) 70 bpm       Heart Rate (Exercise) 111 bpm       Heart Rate (Exit) 70 bpm       Oxygen Saturation (Admit) 92 %       Oxygen Saturation (Exercise) 91 %       Oxygen Saturation (Exit) 94 %       Rating of Perceived Exertion (Exercise) 15       Perceived Dyspnea (Exercise) 3       Symptoms back pain       Comments results       Duration Progress to 30 minutes of  aerobic without signs/symptoms of physical distress       Intensity THRR New         Progression   Progression Continue to progress workloads to maintain intensity without signs/symptoms of physical distress.       Average METs 1.54                Exercise Comments:   Exercise Goals and Review:   Exercise  Goals     Row Name 02/13/23 1644             Exercise Goals   Increase Physical Activity Yes       Intervention Provide advice, education, support and counseling about physical activity/exercise needs.;Develop an individualized exercise prescription for aerobic and resistive training based on initial evaluation findings, risk stratification, comorbidities and participant's personal goals.       Expected Outcomes Short Term: Attend rehab on a regular basis to increase amount of physical activity.;Long Term: Exercising regularly at least 3-5 days a week.;Long Term: Add in home exercise to make exercise part of routine and to increase amount of physical activity.       Increase Strength and Stamina Yes       Intervention Provide advice, education, support and counseling about physical activity/exercise needs.;Develop an individualized exercise prescription for aerobic and resistive training based on initial evaluation findings, risk stratification, comorbidities and participant's personal goals.       Expected Outcomes Short Term: Increase workloads from initial exercise prescription  for resistance, speed, and METs.;Short Term: Perform resistance training exercises routinely during rehab and add in resistance training at home;Long Term: Improve cardiorespiratory fitness, muscular endurance and strength as measured by increased METs and functional capacity ( )       Able to understand and use rate of perceived exertion (RPE) scale Yes       Intervention Provide education and explanation on how to use RPE scale       Expected Outcomes Short Term: Able to use RPE daily in rehab to express subjective intensity level;Long Term:  Able to use RPE to guide intensity level when exercising independently       Able to understand and use Dyspnea scale Yes       Intervention Provide education and explanation on how to use Dyspnea scale       Expected Outcomes Short Term: Able to use Dyspnea scale daily in  rehab to express subjective sense of shortness of breath during exertion;Long Term: Able to use Dyspnea scale to guide intensity level when exercising independently       Knowledge and understanding of Target Heart Rate Range (THRR) Yes       Intervention Provide education and explanation of THRR including how the numbers were predicted and where they are located for reference       Expected Outcomes Short Term: Able to state/look up THRR;Long Term: Able to use THRR to govern intensity when exercising independently;Short Term: Able to use daily as guideline for intensity in rehab       Able to check pulse independently Yes       Intervention Provide education and demonstration on how to check pulse in carotid and radial arteries.;Review the importance of being able to check your own pulse for safety during independent exercise       Expected Outcomes Short Term: Able to explain why pulse checking is important during independent exercise;Long Term: Able to check pulse independently and accurately       Understanding of Exercise Prescription Yes       Intervention Provide education, explanation, and written materials on patient's individual exercise prescription       Expected Outcomes Short Term: Able to explain program exercise prescription;Long Term: Able to explain home exercise prescription to exercise independently                Exercise Goals Re-Evaluation :   Discharge Exercise Prescription (Final Exercise Prescription Changes):  Exercise Prescription Changes - 02/13/23 1600       Response to Exercise   Blood Pressure (Admit) 130/74    Blood Pressure (Exercise) 166/72    Blood Pressure (Exit) 130/74    Heart Rate (Admit) 70 bpm    Heart Rate (Exercise) 111 bpm    Heart Rate (Exit) 70 bpm    Oxygen Saturation (Admit) 92 %    Oxygen Saturation (Exercise) 91 %    Oxygen Saturation (Exit) 94 %    Rating of Perceived Exertion (Exercise) 15    Perceived Dyspnea (Exercise) 3     Symptoms back pain    Comments results    Duration Progress to 30 minutes of  aerobic without signs/symptoms of physical distress    Intensity THRR New      Progression   Progression Continue to progress workloads to maintain intensity without signs/symptoms of physical distress.    Average METs 1.54             Nutrition:  Target Goals: Understanding of nutrition  guidelines, daily intake of sodium 1500mg , cholesterol 200mg , calories 30% from fat and 7% or less from saturated fats, daily to have 5 or more servings of fruits and vegetables.  Education: All About Nutrition: -Group instruction provided by verbal, written material, interactive activities, discussions, models, and posters to present general guidelines for heart healthy nutrition including fat, fiber, MyPlate, the role of sodium in heart healthy nutrition, utilization of the nutrition label, and utilization of this knowledge for meal planning. Follow up email sent as well. Written material given at graduation.   Biometrics:  Pre Biometrics - 02/13/23 1644       Pre Biometrics   Height 5' 6.2" (1.681 m)    Weight 216 lb 1.6 oz (98 kg)    Waist Circumference 45.5 inches    Hip Circumference 50 inches    Waist to Hip Ratio 0.91 %    BMI (Calculated) 34.69    Single Leg Stand 1.5 seconds              Nutrition Therapy Plan and Nutrition Goals:  Nutrition Therapy & Goals - 02/13/23 1648       Personal Nutrition Goals   Nutrition Goal Will meet with RD on 10/3 at 8:45am      Intervention Plan   Intervention Prescribe, educate and counsel regarding individualized specific dietary modifications aiming towards targeted core components such as weight, hypertension, lipid management, diabetes, heart failure and other comorbidities.;Nutrition handout(s) given to patient.    Expected Outcomes Short Term Goal: Understand basic principles of dietary content, such as calories, fat, sodium, cholesterol and  nutrients.;Long Term Goal: Adherence to prescribed nutrition plan.             Nutrition Assessments:  MEDIFICTS Score Key: >=70 Need to make dietary changes  40-70 Heart Healthy Diet <= 40 Therapeutic Level Cholesterol Diet  Flowsheet Row Pulmonary Rehab from 02/13/2023 in [facility] Cardiac and Pulmonary Rehab  Picture Your Plate Total Score on Admission 57      Picture Your Plate Scores: <16 Unhealthy dietary pattern with much room for improvement. 41-50 Dietary pattern unlikely to meet recommendations for good health and room for improvement. 51-60 More healthful dietary pattern, with some room for improvement.  >60 Healthy dietary pattern, although there may be some specific behaviors that could be improved.   Nutrition Goals Re-Evaluation:   Nutrition Goals Discharge (Final Nutrition Goals Re-Evaluation):   Psychosocial: Target Goals: Acknowledge presence or absence of significant depression and/or stress, maximize coping skills, provide positive support system. Participant is able to verbalize types and ability to use techniques and skills needed for reducing stress and depression.   Education: Stress, Anxiety, and Depression - Group verbal and visual presentation to define topics covered.  Reviews how body is impacted by stress, anxiety, and depression.  Also discusses healthy ways to reduce stress and to treat/manage anxiety and depression.  Written material given at graduation.   Education: Sleep Hygiene -Provides group verbal and written instruction about how sleep can affect your health.  Define sleep hygiene, discuss sleep cycles and impact of sleep habits. Review good sleep hygiene tips.    Initial Review & Psychosocial Screening:  Initial Psych Review & Screening - 01/25/23 1419       Initial Review   Current issues with None Identified      Family Dynamics   Good Support System? Yes    Comments She can look to her two sons and a daughter that lives here.  She lives alone but  is able to call her family if she needs help.      Barriers   Psychosocial barriers to participate in program The patient should benefit from training in stress management and relaxation.;There are no identifiable barriers or psychosocial needs.      Screening Interventions   Interventions Encouraged to exercise;To provide support and resources with identified psychosocial needs;Provide feedback about the scores to participant    Expected Outcomes Short Term goal: Utilizing psychosocial counselor, staff and physician to assist with identification of specific Stressors or current issues interfering with healing process. Setting desired goal for each stressor or current issue identified.;Long Term Goal: Stressors or current issues are controlled or eliminated.;Short Term goal: Identification and review with participant of any Quality of Life or Depression concerns found by scoring the questionnaire.;Long Term goal: The participant improves quality of Life and PHQ9 Scores as seen by post scores and/or verbalization of changes             Quality of Life Scores:  Scores of 19 and below usually indicate a poorer quality of life in these areas.  A difference of  2-3 points is a clinically meaningful difference.  A difference of 2-3 points in the total score of the Quality of Life Index has been associated with significant improvement in overall quality of life, self-image, physical symptoms, and general health in studies assessing change in quality of life.  PHQ-9: Review Flowsheet  More data exists      01/11/2023 10/03/2022 08/11/2022 08/03/2022 06/09/2022  Depression screen PHQ 2/9  Decreased Interest 0 0 0 0 0  Down, Depressed, Hopeless 0 0 0 0 0  PHQ - 2 Score 0 0 0 0 0  Altered sleeping - 1 1 0 1  Tired, decreased energy - 2 2 3 3   Change in appetite - 2 2 0 0  Feeling bad or failure about yourself  - 0 0 0 0  Trouble concentrating - 0 0 0 0  Moving slowly or  fidgety/restless - 0 0 0 0  Suicidal thoughts - 0 0 0 0  PHQ-9 Score - 5 5 3 4   Difficult doing work/chores - Not difficult at all Not difficult at all Not difficult at all Not difficult at all    Details           Interpretation of Total Score  Total Score Depression Severity:  1-4 = Minimal depression, 5-9 = Mild depression, 10-14 = Moderate depression, 15-19 = Moderately severe depression, 20-27 = Severe depression   Psychosocial Evaluation and Intervention:  Psychosocial Evaluation - 01/25/23 1420       Psychosocial Evaluation & Interventions   Interventions Relaxation education;Stress management education;Encouraged to exercise with the program and follow exercise prescription    Comments She can look to her two sons and a daughter that lives here. She lives alone but is able to call her family if she needs help.    Expected Outcomes Short: Start [facility] to help with mood. Long: Maintain a healthy mental state.    Continue Psychosocial Services  Follow up required by staff             Psychosocial Re-Evaluation:   Psychosocial Discharge (Final Psychosocial Re-Evaluation):   Education: Education Goals: Education classes will be provided on a weekly basis, covering required topics. Participant will state understanding/return demonstration of topics presented.  Learning Barriers/Preferences:  Learning Barriers/Preferences - 01/25/23 1416       Learning Barriers/Preferences   Learning Barriers None  Learning Preferences None             General Pulmonary Education Topics:  Infection Prevention: - Provides verbal and written material to individual with discussion of infection control including proper hand washing and proper equipment cleaning during exercise session. Flowsheet Row Pulmonary Rehab from 02/13/2023 in [facility] Cardiac and Pulmonary Rehab  Date 02/13/23  Educator MB  Instruction Review Code 1- Verbalizes Understanding       Falls  Prevention: - Provides verbal and written material to individual with discussion of falls prevention and safety. Flowsheet Row Pulmonary Rehab from 02/13/2023 in [facility] Cardiac and Pulmonary Rehab  Date 02/13/23  Educator MB  Instruction Review Code 1- Verbalizes Understanding       Chronic Lung Disease Review: - Group verbal instruction with posters, models, PowerPoint presentations and videos,  to review new updates, new respiratory medications, new advancements in procedures and treatments. Providing information on websites and "800" numbers for continued self-education. Includes information about supplement oxygen, available portable oxygen systems, continuous and intermittent flow rates, oxygen safety, concentrators, and Medicare reimbursement for oxygen. Explanation of Pulmonary Drugs, including class, frequency, complications, importance of spacers, rinsing mouth after steroid MDI's, and proper cleaning methods for nebulizers. Review of basic lung anatomy and physiology related to function, structure, and complications of lung disease. Review of risk factors. Discussion about methods for diagnosing sleep apnea and types of masks and machines for OSA. Includes a review of the use of types of environmental controls: home humidity, furnaces, filters, dust mite/pet prevention, HEPA vacuums. Discussion about weather changes, air quality and the benefits of nasal washing. Instruction on Warning signs, infection symptoms, calling MD promptly, preventive modes, and value of vaccinations. Review of effective airway clearance, coughing and/or vibration techniques. Emphasizing that all should Create an Action Plan. Written material given at graduation. Flowsheet Row Pulmonary Rehab from 02/13/2023 in [facility] Cardiac and Pulmonary Rehab  Education need identified 02/13/23       AED/CPR: - Group verbal and written instruction with the use of models to demonstrate the basic use of the AED with the basic ABC's  of resuscitation.    Anatomy and Cardiac Procedures: - Group verbal and visual presentation and models provide information about basic cardiac anatomy and function. Reviews the testing methods done to diagnose heart disease and the outcomes of the test results. Describes the treatment choices: Medical Management, Angioplasty, or Coronary Bypass Surgery for treating various heart conditions including Myocardial Infarction, Angina, Valve Disease, and Cardiac Arrhythmias.  Written material given at graduation.   Medication Safety: - Group verbal and visual instruction to review commonly prescribed medications for heart and lung disease. Reviews the medication, class of the drug, and side effects. Includes the steps to properly store meds and maintain the prescription regimen.  Written material given at graduation.   Other: -Provides group and verbal instruction on various topics (see comments)   Knowledge Questionnaire Score:  Knowledge Questionnaire Score - 02/13/23 1651       Knowledge Questionnaire Score   Pre Score 13/18              Core Components/Risk Factors/Patient Goals at Admission:  Personal Goals and Risk Factors at Admission - 02/13/23 1655       Core Components/Risk Factors/Patient Goals on Admission    Weight Management Yes;Weight Loss    Intervention Weight Management: Develop a combined nutrition and exercise program designed to reach desired caloric intake, while maintaining appropriate intake of nutrient and fiber, sodium and fats, and  appropriate energy expenditure required for the weight goal.;Weight Management: Provide education and appropriate resources to help participant work on and attain dietary goals.;Weight Management/Obesity: Establish reasonable short term and long term weight goals.;Obesity: Provide education and appropriate resources to help participant work on and attain dietary goals.    Admit Weight 216 lb 1.6 oz (98 kg)    Goal Weight: Short  Term 206 lb 1.6 oz (93.5 kg)    Goal Weight: Long Term 196 lb 1.6 oz (89 kg)    Expected Outcomes Short Term: Continue to assess and modify interventions until short term weight is achieved;Long Term: Adherence to nutrition and physical activity/exercise program aimed toward attainment of established weight goal;Weight Maintenance: Understanding of the daily nutrition guidelines, which includes 25-35% calories from fat, 7% or less cal from saturated fats, less than 200mg  cholesterol, less than 1.5gm of sodium, & 5 or more servings of fruits and vegetables daily;Weight Loss: Understanding of general recommendations for a balanced deficit meal plan, which promotes 1-2 lb weight loss per week and includes a negative energy balance of [phone] kcal/d;Understanding recommendations for meals to include 15-35% energy as protein, 25-35% energy from fat, 35-60% energy from carbohydrates, less than 200mg  of dietary cholesterol, 20-35 gm of total fiber daily;Understanding of distribution of calorie intake throughout the day with the consumption of 4-5 meals/snacks    Improve shortness of breath with ADL's Yes    Intervention Provide education, individualized exercise plan and daily activity instruction to help decrease symptoms of SOB with activities of daily living.    Expected Outcomes Short Term: Improve cardiorespiratory fitness to achieve a reduction of symptoms when performing ADLs;Long Term: Be able to perform more ADLs without symptoms or delay the onset of symptoms    Heart Failure Yes    Intervention Provide a combined exercise and nutrition program that is supplemented with education, support and counseling about heart failure. Directed toward relieving symptoms such as shortness of breath, decreased exercise tolerance, and extremity edema.    Expected Outcomes Improve functional capacity of life;Short term: Attendance in program 2-3 days a week with increased exercise capacity. Reported lower sodium intake.  Reported increased fruit and vegetable intake. Reports medication compliance.;Short term: Daily weights obtained and reported for increase. Utilizing diuretic protocols set by physician.;Long term: Adoption of self-care skills and reduction of barriers for early signs and symptoms recognition and intervention leading to self-care maintenance.    Hypertension Yes    Intervention Provide education on lifestyle modifcations including regular physical activity/exercise, weight management, moderate sodium restriction and increased consumption of fresh fruit, vegetables, and low fat dairy, alcohol moderation, and smoking cessation.;Monitor prescription use compliance.    Expected Outcomes Short Term: Continued assessment and intervention until BP is < 140/85mm HG in hypertensive participants. < 130/23mm HG in hypertensive participants with diabetes, heart failure or chronic kidney disease.;Long Term: Maintenance of blood pressure at goal levels.             Education:Diabetes - Individual verbal and written instruction to review signs/symptoms of diabetes, desired ranges of glucose level fasting, after meals and with exercise. Acknowledge that pre and post exercise glucose checks will be done for 3 sessions at entry of program.   Know Your Numbers and Heart Failure: - Group verbal and visual instruction to discuss disease risk factors for cardiac and pulmonary disease and treatment options.  Reviews associated critical values for Overweight/Obesity, Hypertension, Cholesterol, and Diabetes.  Discusses basics of heart failure: signs/symptoms and treatments.  Introduces Heart Failure Zone  chart for action plan for heart failure.  Written material given at graduation.   Core Components/Risk Factors/Patient Goals Review:    Core Components/Risk Factors/Patient Goals at Discharge (Final Review):    ITP Comments:  ITP Comments     Row Name 01/25/23 1418 02/13/23 1638         ITP Comments Virtual  Visit completed. Patient informed on EP and RD appointment and 6 Minute walk test. Patient also informed of patient health questionnaires on My Chart. Patient Verbalizes understanding. Visit diagnosis can be found in [facility] 01/03/2023. Completed and gym orientation. Initial ITP created and sent for review to Dr. Jinny Sanders, Medical Director.               Comments: Initial ITP

## 2023-02-15 ENCOUNTER — Encounter: Payer: Self-pay | Admitting: *Deleted

## 2023-02-15 ENCOUNTER — Other Ambulatory Visit: Payer: Self-pay | Admitting: Cardiovascular Disease

## 2023-02-15 DIAGNOSIS — I5032 Chronic diastolic (congestive) heart failure: Secondary | ICD-10-CM

## 2023-02-15 NOTE — Progress Notes (Signed)
Pulmonary Individual Treatment Plan  Patient Details  Name: Lauren Lloyd MRN: [id number] Date of Birth: January 04, 1941 Referring Provider:   Flowsheet Row Pulmonary Rehab from 02/13/2023 in [facility] Cardiac and Pulmonary Rehab  Referring Provider Julien Nordmann, MD       Initial Encounter Date:  Flowsheet Row Pulmonary Rehab from 02/13/2023 in [facility] Cardiac and Pulmonary Rehab  Date 02/13/23       Visit Diagnosis: Heart failure, diastolic, chronic (HCC)  Patient's Home Medications on Admission:  Current Outpatient Medications:    acetaminophen (TYLENOL) 325 MG tablet, Take 650 mg by mouth every 6 (six) hours as needed for moderate pain., Disp: , Rfl:    allopurinol (ZYLOPRIM) 100 MG tablet, Take 1 tablet (100 mg total) by mouth daily., Disp: 30 tablet, Rfl: 6   bisoprolol (ZEBETA) 10 MG tablet, Take 1 tablet (10 mg total) by mouth 2 (two) times daily., Disp: 180 tablet, Rfl: 3   calcium carbonate (TUMS EX) 750 MG chewable tablet, Chew 2 tablets by mouth daily as needed for heartburn., Disp: , Rfl:    Cholecalciferol 25 MCG (1000 UT) tablet, Take 1,000 Units by mouth daily., Disp: , Rfl:    ELIQUIS 5 MG TABS tablet, TAKE ONE TABLET TWICE DAILY, Disp: 180 tablet, Rfl: 1   ezetimibe (ZETIA) 10 MG tablet, TAKE 1 TABLET BY MOUTH DAILY, Disp: 90 tablet, Rfl: 3   furosemide (LASIX) 40 MG tablet, TAKE 1 TABLET BY MOUTH DAILY. TAKE AN EXTRA TABLET AS NEEDED AFTER LUNCH FOR ABDOMINAL SWELLING, LEG SWELLING OR SHORTNESS OF BREATH, Disp: 180 tablet, Rfl: 1   gabapentin (NEURONTIN) 600 MG tablet, TAKE ONE TABLET BY MOUTH AT BEDTIME, Disp: 90 tablet, Rfl: 0   levalbuterol (XOPENEX HFA) 45 MCG/ACT inhaler, Inhale 2 puffs into the lungs every 8 (eight) hours as needed for wheezing., Disp: 1 each, Rfl: 2   Menthol, Topical Analgesic, (BIOFREEZE EX), Apply 1 application. topically daily as needed (Neck pain)., Disp: , Rfl:    metaxalone (SKELAXIN) 800 MG tablet, Take 800 mg by mouth daily as needed for muscle  spasms., Disp: , Rfl:    Multiple Vitamin (MULTIVITAMIN) capsule, Take 1 capsule by mouth daily., Disp: , Rfl:    mupirocin ointment (BACTROBAN) 2 %, Place 1 application  into the nose 2 (two) times daily., Disp: 22 g, Rfl: 0   naltrexone (DEPADE) 50 MG tablet, TAKE 1/2 TABLET BY MOUTH DAILY (Patient not taking: Reported on 01/25/2023), Disp: 15 tablet, Rfl: 2   potassium chloride (KLOR-CON) 10 MEQ tablet, TAKE 1 TABLET BY MOUTH DAILY, Disp: 30 tablet, Rfl: 0   Tiotropium Bromide-Olodaterol (STIOLTO RESPIMAT) 2.5-2.5 MCG/ACT AERS, Inhale 2 puffs into the lungs daily., Disp: 4 g, Rfl: 0  Past Medical History: Past Medical History:  Diagnosis Date   (HFpEF) heart failure with preserved ejection fraction (HCC)    a. 05/2018 Echo: EF 55-60%, no rwma, mild to mod MR. Nl RV fxn. Mod TR. PASP .   Arthritis    knees, Hands   Arthritis of knee    Back pain    Carotid arterial disease (HCC)    a. 03/2019 Carotid U/S: <50% bilat ICA stenoses.   Cholelithiasis    a. 10/2018 noted on CT.   Edema, lower extremity    Fatty liver    GERD (gastroesophageal reflux disease)    History of stress test    a. 06/2018 MV: EF 59%, no ischemia/infarct. Low risk.   Knee pain    Lactose intolerance    Mitral  regurgitation    a. 05/2018 Echo: mild to mod MR.   Multinodular goiter    Obesity    OSA (obstructive sleep apnea)    PAF (paroxysmal atrial fibrillation) (HCC)    a.  Diagnosed 12/19; b. 05/2018 s/p DCCV; c. 03/2019 & 05/2019 recurrent AFib-->managed w/ amio load; d. CHADS2VASc = 6 (CHF, HTN, age x 2, vascular disease, female)-->Eliquis & amio 100 qd.   PAH (pulmonary artery hypertension) (HCC)    RSV (acute bronchiolitis due to respiratory syncytial virus) 05/10/2022   Scoliosis    SOB (shortness of breath)    Swallowing difficulty     Tobacco Use: Social History   Tobacco Use  Smoking Status Former   Current packs/day: 0.00   Average packs/day: 1 pack/day for 30.0 years (30.0 ttl pk-yrs)    Types: Cigarettes   Start date: 05/17/1959   Quit date: 05/16/1989   Years since quitting: 33.7   Passive exposure: Past  Smokeless Tobacco Never    Labs: Review Flowsheet  More data exists      Latest Ref Rng & Units 12/09/2019 08/03/2020 08/16/2021 03/17/2022 10/28/2022  Labs for ITP Cardiac and Pulmonary Rehab  Cholestrol 100 - 199 mg/dL 161  096  CANCELED  045  176   LDL (calc) 0 - 99 mg/dL 409  811  - 93  914   HDL-C >39 mg/dL 73  65  CANCELED  52  54   Trlycerides 0 - 149 mg/dL 782  956  CANCELED  94  126   Hemoglobin A1c 4.8 - 5.6 % 5.5  - - 6.0  6.0     Details             Pulmonary Assessment Scores:  Pulmonary Assessment Scores     Row Name 02/13/23 1645         ADL UCSD   ADL Phase Entry     SOB Score total 57     Rest 0     Walk 3     Stairs 4     Bath 1     Dress 1     Shop 1       CAT Score   CAT Score 23       mMRC Score   mMRC Score 2              UCSD: Self-administered rating of dyspnea associated with activities of daily living (ADLs) 6-point scale (0 = "not at all" to 5 = "maximal or unable to do because of breathlessness")  Scoring Scores range from 0 to 120.  Minimally important difference is 5 units  CAT: CAT can identify the health impairment of COPD patients and is better correlated with disease progression.  CAT has a scoring range of zero to 40. The CAT score is classified into four groups of low (less than 10), medium (10 - 20), high (21-30) and very high (31-40) based on the impact level of disease on health status. A CAT score over 10 suggests significant symptoms.  A worsening CAT score could be explained by an exacerbation, poor medication adherence, poor inhaler technique, or progression of COPD or comorbid conditions.  CAT MCID is 2 points  mMRC: mMRC (Modified Medical Research Council) Dyspnea Scale is used to assess the degree of baseline functional disability in patients of respiratory disease due to dyspnea. No minimal  important difference is established. A decrease in score of 1 point or greater is considered a positive change.  Pulmonary Function Assessment:  Pulmonary Function Assessment - 01/25/23 1416       Breath   Shortness of Breath Yes;Limiting activity             Exercise Target Goals: Exercise Program Goal: Individual exercise prescription set using results from initial 6 min walk test and THRR while considering  patient's activity barriers and safety.   Exercise Prescription Goal: Initial exercise prescription builds to 30-45 minutes a day of aerobic activity, 2-3 days per week.  Home exercise guidelines will be given to patient during program as part of exercise prescription that the participant will acknowledge.  Education: Aerobic Exercise: - Group verbal and visual presentation on the components of exercise prescription. Introduces F.I.T.T principle from ACSM for exercise prescriptions.  Reviews F.I.T.T. principles of aerobic exercise including progression. Written material given at graduation.   Education: Resistance Exercise: - Group verbal and visual presentation on the components of exercise prescription. Introduces F.I.T.T principle from ACSM for exercise prescriptions  Reviews F.I.T.T. principles of resistance exercise including progression. Written material given at graduation.    Education: Exercise & Equipment Safety: - Individual verbal instruction and demonstration of equipment use and safety with use of the equipment. Flowsheet Row Pulmonary Rehab from 02/13/2023 in [facility] Cardiac and Pulmonary Rehab  Date 02/13/23  Educator MB  Instruction Review Code 1- Verbalizes Understanding       Education: Exercise Physiology & General Exercise Guidelines: - Group verbal and written instruction with models to review the exercise physiology of the cardiovascular system and associated critical values. Provides general exercise guidelines with specific guidelines to those with  heart or lung disease.  Flowsheet Row Pulmonary Rehab from 02/13/2023 in [facility] Cardiac and Pulmonary Rehab  Education need identified 02/13/23       Education: Flexibility, Balance, Mind/Body Relaxation: - Group verbal and visual presentation with interactive activity on the components of exercise prescription. Introduces F.I.T.T principle from ACSM for exercise prescriptions. Reviews F.I.T.T. principles of flexibility and balance exercise training including progression. Also discusses the mind body connection.  Reviews various relaxation techniques to help reduce and manage stress (i.e. Deep breathing, progressive muscle relaxation, and visualization). Balance handout provided to take home. Written material given at graduation.   Activity Barriers & Risk Stratification:  Activity Barriers & Cardiac Risk Stratification - 02/13/23 1640       Activity Barriers & Cardiac Risk Stratification   Activity Barriers Arthritis;Back Problems;Shortness of Breath             6 Minute Walk:  6 Minute Walk     Row Name 02/13/23 1638         6 Minute Walk   Phase Initial     Distance 945 feet     Walk Time 6 minutes     # of Rest Breaks 0     MPH 1.79     METS 1.54     RPE 15     Perceived Dyspnea  3     VO2 Peak 5.41     Symptoms Yes (comment)     Comments back pain     Resting HR 70 bpm     Resting BP 130/74     Resting Oxygen Saturation  92 %     Exercise Oxygen Saturation  during 6 min walk 91 %     Max Ex. HR 111 bpm     Max Ex. BP 166/72     2 Minute Post BP 150/74  Interval HR   1 Minute HR 103     2 Minute HR 107     3 Minute HR 109     4 Minute HR 111     5 Minute HR 82     6 Minute HR 83     2 Minute Post HR 70     Interval Heart Rate? Yes       Interval Oxygen   Interval Oxygen? Yes     Baseline Oxygen Saturation % 92 %     1 Minute Oxygen Saturation % 91 %     1 Minute Liters of Oxygen 0 L     2 Minute Oxygen Saturation % 92 %     2 Minute Liters of  Oxygen 0 L     3 Minute Oxygen Saturation % 91 %     3 Minute Liters of Oxygen 0 L     4 Minute Oxygen Saturation % 91 %     4 Minute Liters of Oxygen 0 L     5 Minute Oxygen Saturation % 95 %     5 Minute Liters of Oxygen 0 L     6 Minute Oxygen Saturation % 93 %     6 Minute Liters of Oxygen 0 L     2 Minute Post Oxygen Saturation % 96 %     2 Minute Post Liters of Oxygen 0 L             Oxygen Initial Assessment:  Oxygen Initial Assessment - 01/25/23 1415       Home Oxygen   Home Oxygen Device None    Sleep Oxygen Prescription CPAP    Liters per minute --   off and on   Home Exercise Oxygen Prescription None    Home Resting Oxygen Prescription None    Compliance with Home Oxygen Use Yes      Initial 6 min Walk   Oxygen Used None      Program Oxygen Prescription   Program Oxygen Prescription None      Intervention   Short Term Goals To learn and exhibit compliance with exercise, home and travel O2 prescription;To learn and understand importance of monitoring SPO2 with pulse oximeter and demonstrate accurate use of the pulse oximeter.;To learn and understand importance of maintaining oxygen saturations>88%;To learn and demonstrate proper pursed lip breathing techniques or other breathing techniques. ;To learn and demonstrate proper use of respiratory medications    Long  Term Goals Exhibits compliance with exercise, home  and travel O2 prescription;Verbalizes importance of monitoring SPO2 with pulse oximeter and return demonstration;Maintenance of O2 saturations>88%;Exhibits proper breathing techniques, such as pursed lip breathing or other method taught during program session;Compliance with respiratory medication;Demonstrates proper use of MDI's             Oxygen Re-Evaluation:   Oxygen Discharge (Final Oxygen Re-Evaluation):   Initial Exercise Prescription:  Initial Exercise Prescription - 02/13/23 1600       Date of Initial Exercise RX and Referring  Provider   Date 02/13/23    Referring Provider Julien Nordmann, MD      Oxygen   Maintain Oxygen Saturation 88% or higher      Recumbant Bike   Level 1    RPM 50    Watts 14    Minutes 15    METs 1.54      NuStep   Level 1    SPM 80    Minutes 15  METs 1.54      Biostep-RELP   Level 1    SPM 50    Minutes 15    METs 1.54      Track   Laps 8    Minutes 15    METs 1.44      Prescription Details   Frequency (times per week) 2    Duration Progress to 30 minutes of continuous aerobic without signs/symptoms of physical distress      Intensity   THRR 40-80% of Max Heartrate 96-123    Ratings of Perceived Exertion 11-13    Perceived Dyspnea 0-4      Progression   Progression Continue to progress workloads to maintain intensity without signs/symptoms of physical distress.      Resistance Training   Training Prescription Yes    Weight 5lb    Reps 10-15             Perform Capillary Blood Glucose checks as needed.  Exercise Prescription Changes:   Exercise Prescription Changes     Row Name 02/13/23 1600             Response to Exercise   Blood Pressure (Admit) 130/74       Blood Pressure (Exercise) 166/72       Blood Pressure (Exit) 130/74       Heart Rate (Admit) 70 bpm       Heart Rate (Exercise) 111 bpm       Heart Rate (Exit) 70 bpm       Oxygen Saturation (Admit) 92 %       Oxygen Saturation (Exercise) 91 %       Oxygen Saturation (Exit) 94 %       Rating of Perceived Exertion (Exercise) 15       Perceived Dyspnea (Exercise) 3       Symptoms back pain       Comments results       Duration Progress to 30 minutes of  aerobic without signs/symptoms of physical distress       Intensity THRR New         Progression   Progression Continue to progress workloads to maintain intensity without signs/symptoms of physical distress.       Average METs 1.54                Exercise Comments:   Exercise Goals and Review:   Exercise  Goals     Row Name 02/13/23 1644             Exercise Goals   Increase Physical Activity Yes       Intervention Provide advice, education, support and counseling about physical activity/exercise needs.;Develop an individualized exercise prescription for aerobic and resistive training based on initial evaluation findings, risk stratification, comorbidities and participant's personal goals.       Expected Outcomes Short Term: Attend rehab on a regular basis to increase amount of physical activity.;Long Term: Exercising regularly at least 3-5 days a week.;Long Term: Add in home exercise to make exercise part of routine and to increase amount of physical activity.       Increase Strength and Stamina Yes       Intervention Provide advice, education, support and counseling about physical activity/exercise needs.;Develop an individualized exercise prescription for aerobic and resistive training based on initial evaluation findings, risk stratification, comorbidities and participant's personal goals.       Expected Outcomes Short Term: Increase workloads from initial exercise prescription  for resistance, speed, and METs.;Short Term: Perform resistance training exercises routinely during rehab and add in resistance training at home;Long Term: Improve cardiorespiratory fitness, muscular endurance and strength as measured by increased METs and functional capacity ( )       Able to understand and use rate of perceived exertion (RPE) scale Yes       Intervention Provide education and explanation on how to use RPE scale       Expected Outcomes Short Term: Able to use RPE daily in rehab to express subjective intensity level;Long Term:  Able to use RPE to guide intensity level when exercising independently       Able to understand and use Dyspnea scale Yes       Intervention Provide education and explanation on how to use Dyspnea scale       Expected Outcomes Short Term: Able to use Dyspnea scale daily in  rehab to express subjective sense of shortness of breath during exertion;Long Term: Able to use Dyspnea scale to guide intensity level when exercising independently       Knowledge and understanding of Target Heart Rate Range (THRR) Yes       Intervention Provide education and explanation of THRR including how the numbers were predicted and where they are located for reference       Expected Outcomes Short Term: Able to state/look up THRR;Long Term: Able to use THRR to govern intensity when exercising independently;Short Term: Able to use daily as guideline for intensity in rehab       Able to check pulse independently Yes       Intervention Provide education and demonstration on how to check pulse in carotid and radial arteries.;Review the importance of being able to check your own pulse for safety during independent exercise       Expected Outcomes Short Term: Able to explain why pulse checking is important during independent exercise;Long Term: Able to check pulse independently and accurately       Understanding of Exercise Prescription Yes       Intervention Provide education, explanation, and written materials on patient's individual exercise prescription       Expected Outcomes Short Term: Able to explain program exercise prescription;Long Term: Able to explain home exercise prescription to exercise independently                Exercise Goals Re-Evaluation :   Discharge Exercise Prescription (Final Exercise Prescription Changes):  Exercise Prescription Changes - 02/13/23 1600       Response to Exercise   Blood Pressure (Admit) 130/74    Blood Pressure (Exercise) 166/72    Blood Pressure (Exit) 130/74    Heart Rate (Admit) 70 bpm    Heart Rate (Exercise) 111 bpm    Heart Rate (Exit) 70 bpm    Oxygen Saturation (Admit) 92 %    Oxygen Saturation (Exercise) 91 %    Oxygen Saturation (Exit) 94 %    Rating of Perceived Exertion (Exercise) 15    Perceived Dyspnea (Exercise) 3     Symptoms back pain    Comments results    Duration Progress to 30 minutes of  aerobic without signs/symptoms of physical distress    Intensity THRR New      Progression   Progression Continue to progress workloads to maintain intensity without signs/symptoms of physical distress.    Average METs 1.54             Nutrition:  Target Goals: Understanding of nutrition  guidelines, daily intake of sodium 1500mg , cholesterol 200mg , calories 30% from fat and 7% or less from saturated fats, daily to have 5 or more servings of fruits and vegetables.  Education: All About Nutrition: -Group instruction provided by verbal, written material, interactive activities, discussions, models, and posters to present general guidelines for heart healthy nutrition including fat, fiber, MyPlate, the role of sodium in heart healthy nutrition, utilization of the nutrition label, and utilization of this knowledge for meal planning. Follow up email sent as well. Written material given at graduation.   Biometrics:  Pre Biometrics - 02/13/23 1644       Pre Biometrics   Height 5' 6.2" (1.681 m)    Weight 216 lb 1.6 oz (98 kg)    Waist Circumference 45.5 inches    Hip Circumference 50 inches    Waist to Hip Ratio 0.91 %    BMI (Calculated) 34.69    Single Leg Stand 1.5 seconds              Nutrition Therapy Plan and Nutrition Goals:  Nutrition Therapy & Goals - 02/13/23 1648       Personal Nutrition Goals   Nutrition Goal Will meet with RD on 10/3 at 8:45am      Intervention Plan   Intervention Prescribe, educate and counsel regarding individualized specific dietary modifications aiming towards targeted core components such as weight, hypertension, lipid management, diabetes, heart failure and other comorbidities.;Nutrition handout(s) given to patient.    Expected Outcomes Short Term Goal: Understand basic principles of dietary content, such as calories, fat, sodium, cholesterol and  nutrients.;Long Term Goal: Adherence to prescribed nutrition plan.             Nutrition Assessments:  MEDIFICTS Score Key: >=70 Need to make dietary changes  40-70 Heart Healthy Diet <= 40 Therapeutic Level Cholesterol Diet  Flowsheet Row Pulmonary Rehab from 02/13/2023 in [facility] Cardiac and Pulmonary Rehab  Picture Your Plate Total Score on Admission 57      Picture Your Plate Scores: <16 Unhealthy dietary pattern with much room for improvement. 41-50 Dietary pattern unlikely to meet recommendations for good health and room for improvement. 51-60 More healthful dietary pattern, with some room for improvement.  >60 Healthy dietary pattern, although there may be some specific behaviors that could be improved.   Nutrition Goals Re-Evaluation:   Nutrition Goals Discharge (Final Nutrition Goals Re-Evaluation):   Psychosocial: Target Goals: Acknowledge presence or absence of significant depression and/or stress, maximize coping skills, provide positive support system. Participant is able to verbalize types and ability to use techniques and skills needed for reducing stress and depression.   Education: Stress, Anxiety, and Depression - Group verbal and visual presentation to define topics covered.  Reviews how body is impacted by stress, anxiety, and depression.  Also discusses healthy ways to reduce stress and to treat/manage anxiety and depression.  Written material given at graduation.   Education: Sleep Hygiene -Provides group verbal and written instruction about how sleep can affect your health.  Define sleep hygiene, discuss sleep cycles and impact of sleep habits. Review good sleep hygiene tips.    Initial Review & Psychosocial Screening:  Initial Psych Review & Screening - 01/25/23 1419       Initial Review   Current issues with None Identified      Family Dynamics   Good Support System? Yes    Comments She can look to her two sons and a daughter that lives here.  She lives alone but  is able to call her family if she needs help.      Barriers   Psychosocial barriers to participate in program The patient should benefit from training in stress management and relaxation.;There are no identifiable barriers or psychosocial needs.      Screening Interventions   Interventions Encouraged to exercise;To provide support and resources with identified psychosocial needs;Provide feedback about the scores to participant    Expected Outcomes Short Term goal: Utilizing psychosocial counselor, staff and physician to assist with identification of specific Stressors or current issues interfering with healing process. Setting desired goal for each stressor or current issue identified.;Long Term Goal: Stressors or current issues are controlled or eliminated.;Short Term goal: Identification and review with participant of any Quality of Life or Depression concerns found by scoring the questionnaire.;Long Term goal: The participant improves quality of Life and PHQ9 Scores as seen by post scores and/or verbalization of changes             Quality of Life Scores:  Scores of 19 and below usually indicate a poorer quality of life in these areas.  A difference of  2-3 points is a clinically meaningful difference.  A difference of 2-3 points in the total score of the Quality of Life Index has been associated with significant improvement in overall quality of life, self-image, physical symptoms, and general health in studies assessing change in quality of life.  PHQ-9: Review Flowsheet  More data exists      01/11/2023 10/03/2022 08/11/2022 08/03/2022 06/09/2022  Depression screen PHQ 2/9  Decreased Interest 0 0 0 0 0  Down, Depressed, Hopeless 0 0 0 0 0  PHQ - 2 Score 0 0 0 0 0  Altered sleeping - 1 1 0 1  Tired, decreased energy - 2 2 3 3   Change in appetite - 2 2 0 0  Feeling bad or failure about yourself  - 0 0 0 0  Trouble concentrating - 0 0 0 0  Moving slowly or  fidgety/restless - 0 0 0 0  Suicidal thoughts - 0 0 0 0  PHQ-9 Score - 5 5 3 4   Difficult doing work/chores - Not difficult at all Not difficult at all Not difficult at all Not difficult at all    Details           Interpretation of Total Score  Total Score Depression Severity:  1-4 = Minimal depression, 5-9 = Mild depression, 10-14 = Moderate depression, 15-19 = Moderately severe depression, 20-27 = Severe depression   Psychosocial Evaluation and Intervention:  Psychosocial Evaluation - 01/25/23 1420       Psychosocial Evaluation & Interventions   Interventions Relaxation education;Stress management education;Encouraged to exercise with the program and follow exercise prescription    Comments She can look to her two sons and a daughter that lives here. She lives alone but is able to call her family if she needs help.    Expected Outcomes Short: Start [facility] to help with mood. Long: Maintain a healthy mental state.    Continue Psychosocial Services  Follow up required by staff             Psychosocial Re-Evaluation:   Psychosocial Discharge (Final Psychosocial Re-Evaluation):   Education: Education Goals: Education classes will be provided on a weekly basis, covering required topics. Participant will state understanding/return demonstration of topics presented.  Learning Barriers/Preferences:  Learning Barriers/Preferences - 01/25/23 1416       Learning Barriers/Preferences   Learning Barriers None  Learning Preferences None             General Pulmonary Education Topics:  Infection Prevention: - Provides verbal and written material to individual with discussion of infection control including proper hand washing and proper equipment cleaning during exercise session. Flowsheet Row Pulmonary Rehab from 02/13/2023 in [facility] Cardiac and Pulmonary Rehab  Date 02/13/23  Educator MB  Instruction Review Code 1- Verbalizes Understanding       Falls  Prevention: - Provides verbal and written material to individual with discussion of falls prevention and safety. Flowsheet Row Pulmonary Rehab from 02/13/2023 in [facility] Cardiac and Pulmonary Rehab  Date 02/13/23  Educator MB  Instruction Review Code 1- Verbalizes Understanding       Chronic Lung Disease Review: - Group verbal instruction with posters, models, PowerPoint presentations and videos,  to review new updates, new respiratory medications, new advancements in procedures and treatments. Providing information on websites and "800" numbers for continued self-education. Includes information about supplement oxygen, available portable oxygen systems, continuous and intermittent flow rates, oxygen safety, concentrators, and Medicare reimbursement for oxygen. Explanation of Pulmonary Drugs, including class, frequency, complications, importance of spacers, rinsing mouth after steroid MDI's, and proper cleaning methods for nebulizers. Review of basic lung anatomy and physiology related to function, structure, and complications of lung disease. Review of risk factors. Discussion about methods for diagnosing sleep apnea and types of masks and machines for OSA. Includes a review of the use of types of environmental controls: home humidity, furnaces, filters, dust mite/pet prevention, HEPA vacuums. Discussion about weather changes, air quality and the benefits of nasal washing. Instruction on Warning signs, infection symptoms, calling MD promptly, preventive modes, and value of vaccinations. Review of effective airway clearance, coughing and/or vibration techniques. Emphasizing that all should Create an Action Plan. Written material given at graduation. Flowsheet Row Pulmonary Rehab from 02/13/2023 in [facility] Cardiac and Pulmonary Rehab  Education need identified 02/13/23       AED/CPR: - Group verbal and written instruction with the use of models to demonstrate the basic use of the AED with the basic ABC's  of resuscitation.    Anatomy and Cardiac Procedures: - Group verbal and visual presentation and models provide information about basic cardiac anatomy and function. Reviews the testing methods done to diagnose heart disease and the outcomes of the test results. Describes the treatment choices: Medical Management, Angioplasty, or Coronary Bypass Surgery for treating various heart conditions including Myocardial Infarction, Angina, Valve Disease, and Cardiac Arrhythmias.  Written material given at graduation.   Medication Safety: - Group verbal and visual instruction to review commonly prescribed medications for heart and lung disease. Reviews the medication, class of the drug, and side effects. Includes the steps to properly store meds and maintain the prescription regimen.  Written material given at graduation.   Other: -Provides group and verbal instruction on various topics (see comments)   Knowledge Questionnaire Score:  Knowledge Questionnaire Score - 02/13/23 1651       Knowledge Questionnaire Score   Pre Score 13/18              Core Components/Risk Factors/Patient Goals at Admission:  Personal Goals and Risk Factors at Admission - 02/13/23 1655       Core Components/Risk Factors/Patient Goals on Admission    Weight Management Yes;Weight Loss    Intervention Weight Management: Develop a combined nutrition and exercise program designed to reach desired caloric intake, while maintaining appropriate intake of nutrient and fiber, sodium and fats, and  appropriate energy expenditure required for the weight goal.;Weight Management: Provide education and appropriate resources to help participant work on and attain dietary goals.;Weight Management/Obesity: Establish reasonable short term and long term weight goals.;Obesity: Provide education and appropriate resources to help participant work on and attain dietary goals.    Admit Weight 216 lb 1.6 oz (98 kg)    Goal Weight: Short  Term 206 lb 1.6 oz (93.5 kg)    Goal Weight: Long Term 196 lb 1.6 oz (89 kg)    Expected Outcomes Short Term: Continue to assess and modify interventions until short term weight is achieved;Long Term: Adherence to nutrition and physical activity/exercise program aimed toward attainment of established weight goal;Weight Maintenance: Understanding of the daily nutrition guidelines, which includes 25-35% calories from fat, 7% or less cal from saturated fats, less than 200mg  cholesterol, less than 1.5gm of sodium, & 5 or more servings of fruits and vegetables daily;Weight Loss: Understanding of general recommendations for a balanced deficit meal plan, which promotes 1-2 lb weight loss per week and includes a negative energy balance of [phone] kcal/d;Understanding recommendations for meals to include 15-35% energy as protein, 25-35% energy from fat, 35-60% energy from carbohydrates, less than 200mg  of dietary cholesterol, 20-35 gm of total fiber daily;Understanding of distribution of calorie intake throughout the day with the consumption of 4-5 meals/snacks    Improve shortness of breath with ADL's Yes    Intervention Provide education, individualized exercise plan and daily activity instruction to help decrease symptoms of SOB with activities of daily living.    Expected Outcomes Short Term: Improve cardiorespiratory fitness to achieve a reduction of symptoms when performing ADLs;Long Term: Be able to perform more ADLs without symptoms or delay the onset of symptoms    Heart Failure Yes    Intervention Provide a combined exercise and nutrition program that is supplemented with education, support and counseling about heart failure. Directed toward relieving symptoms such as shortness of breath, decreased exercise tolerance, and extremity edema.    Expected Outcomes Improve functional capacity of life;Short term: Attendance in program 2-3 days a week with increased exercise capacity. Reported lower sodium intake.  Reported increased fruit and vegetable intake. Reports medication compliance.;Short term: Daily weights obtained and reported for increase. Utilizing diuretic protocols set by physician.;Long term: Adoption of self-care skills and reduction of barriers for early signs and symptoms recognition and intervention leading to self-care maintenance.    Hypertension Yes    Intervention Provide education on lifestyle modifcations including regular physical activity/exercise, weight management, moderate sodium restriction and increased consumption of fresh fruit, vegetables, and low fat dairy, alcohol moderation, and smoking cessation.;Monitor prescription use compliance.    Expected Outcomes Short Term: Continued assessment and intervention until BP is < 140/43mm HG in hypertensive participants. < 130/76mm HG in hypertensive participants with diabetes, heart failure or chronic kidney disease.;Long Term: Maintenance of blood pressure at goal levels.             Education:Diabetes - Individual verbal and written instruction to review signs/symptoms of diabetes, desired ranges of glucose level fasting, after meals and with exercise. Acknowledge that pre and post exercise glucose checks will be done for 3 sessions at entry of program.   Know Your Numbers and Heart Failure: - Group verbal and visual instruction to discuss disease risk factors for cardiac and pulmonary disease and treatment options.  Reviews associated critical values for Overweight/Obesity, Hypertension, Cholesterol, and Diabetes.  Discusses basics of heart failure: signs/symptoms and treatments.  Introduces Heart Failure Zone  chart for action plan for heart failure.  Written material given at graduation.   Core Components/Risk Factors/Patient Goals Review:    Core Components/Risk Factors/Patient Goals at Discharge (Final Review):    ITP Comments:  ITP Comments     Row Name 01/25/23 1418 02/13/23 1638 02/15/23 1214       ITP  Comments Virtual Visit completed. Patient informed on EP and RD appointment and 6 Minute walk test. Patient also informed of patient health questionnaires on My Chart. Patient Verbalizes understanding. Visit diagnosis can be found in [facility] 01/03/[facility]23. Completed and gym orientation. Initial ITP created and sent for review to Dr. Jinny Sanders, Medical Director. 30 Day review completed. Medical Director ITP review done, changes made as directed, and signed approval by Medical Director.   new to program              Comments:

## 2023-02-16 ENCOUNTER — Encounter: Payer: Medicare HMO | Attending: Cardiovascular Disease | Admitting: *Deleted

## 2023-02-16 DIAGNOSIS — I5032 Chronic diastolic (congestive) heart failure: Secondary | ICD-10-CM | POA: Insufficient documentation

## 2023-02-16 NOTE — Progress Notes (Signed)
Daily Session Note  Patient Details  Name: Lauren Lloyd MRN: [id number] Date of Birth: [birth year] Referring Provider:   Flowsheet Row Pulmonary Rehab from 02/13/2023 in [facility] Cardiac and Pulmonary Rehab  Referring Provider Julien Nordmann, MD       Encounter Date: 02/16/2023  Check In:  Session Check In - 02/16/23 0810       Check-In   Supervising physician immediately available to respond to emergencies See telemetry face sheet for immediately available ER MD    Location [facility]-Cardiac & Pulmonary Rehab    Staff Present Elige Ko, RCP,RRT,BSRT;Maxon Conetta BS, , Exercise Physiologist;Daeron Carreno Katrinka Blazing, RN, ADN    Virtual Visit No    Medication changes reported     No    Fall or balance concerns reported    No    Warm-up and Cool-down Performed on first and last piece of equipment    Resistance Training Performed Yes    VAD Patient? No    PAD/SET Patient? No      Pain Assessment   Currently in Pain? No/denies                Social History   Tobacco Use  Smoking Status Former   Current packs/day: 0.00   Average packs/day: 1 pack/day for 30.0 years (30.0 ttl pk-yrs)   Types: Cigarettes   Start date: 05/17/1959   Quit date: 05/16/1989   Years since quitting: 33.7   Passive exposure: Past  Smokeless Tobacco Never    Goals Met:  Independence with exercise equipment Exercise tolerated well No report of concerns or symptoms today Strength training completed today  Goals Unmet:  Not Applicable  Comments: First full day of exercise!  Patient was oriented to gym and equipment including functions, settings, policies, and procedures.  Patient's individual exercise prescription and treatment plan were reviewed.  All starting workloads were established based on the results of the 6 minute walk test done at initial orientation visit.  The plan for exercise progression was also introduced and progression will be customized based on patient's performance and goals.     Dr.  Bethann Punches is Medical Director for [facility] Cardiac Rehabilitation.  Dr. Vida Rigger is Medical Director for [facility] Pulmonary Rehabilitation.

## 2023-02-16 NOTE — Progress Notes (Signed)
Assessment start time: 8:48 AM  Digestive issues/concerns: none known food allergies   24-hours Recall: B: cottage cheese, cherries L: fried chicken, green beans, mac and cheese D: 2 ham biscuits  Beverages: 24oz of water daily.  Alcohol some wine Caffeine coffee  Supplements MVI, Vit D Intake Patterns feels she might overeat at some meals. Not snacking much. Sometimes misses breakfast.  Pt eats away from home 1 times a week.  Education r/t nutrition plan Patient drinking 24oz of water daily, has been drinking this much to avoid fluid on her chest, making it hard to breath. Spoke to her about watching her sodium intake as well. She reads labels and is knowledgeable of basics on nutrition. Reviewed Mediterranean diet handout, educated on types of fats, sources, and how to read them on label. Encouraged more veggies at larger meals or when eating poor food choices like fried chicken. She reports that sometimes she gets busy and misses meals. Recommended several smaller meals and snacks with quick grab and go friendly foods to try and be more consistent. Overall, she is doing well and is knowledgeable of how to build balanced plates     Goal 1: Look into healthy snacks to help be consistent in not missing meals  Goal 2: Eat a protein at every meals   End time 9:24 AM

## 2023-02-20 ENCOUNTER — Ambulatory Visit: Payer: Self-pay | Admitting: Radiation Oncology

## 2023-02-20 ENCOUNTER — Ambulatory Visit: Payer: Medicare HMO | Admitting: Radiation Oncology

## 2023-02-27 ENCOUNTER — Ambulatory Visit: Payer: Medicare HMO | Admitting: Radiation Oncology

## 2023-02-28 ENCOUNTER — Encounter: Payer: Medicare HMO | Admitting: *Deleted

## 2023-02-28 DIAGNOSIS — I5032 Chronic diastolic (congestive) heart failure: Secondary | ICD-10-CM

## 2023-02-28 NOTE — Progress Notes (Signed)
Daily Session Note  Patient Details  Name: KAPRI NERO MRN: [id number] Date of Birth: 04/02/1941 Referring Provider:   Flowsheet Row Pulmonary Rehab from 02/13/2023 in [facility] Cardiac and Pulmonary Rehab  Referring Provider Julien Nordmann, MD       Encounter Date: 02/28/2023  Check In:  Session Check In - 02/28/23 0803       Check-In   Supervising physician immediately available to respond to emergencies See telemetry face sheet for immediately available ER MD    Location [facility]-Cardiac & Pulmonary Rehab    Staff Present Maxon Conetta BS, , Exercise Physiologist;Margaret Best, MS, Exercise Physiologist;Noah Tickle, BS, Exercise Physiologist;Shatara Stanek, RN, BSN, CCRP    Virtual Visit No    Medication changes reported     No    Fall or balance concerns reported    No    Warm-up and Cool-down Performed on first and last piece of equipment    Resistance Training Performed Yes    VAD Patient? No    PAD/SET Patient? No      Pain Assessment   Currently in Pain? No/denies                Social History   Tobacco Use  Smoking Status Former   Current packs/day: 0.00   Average packs/day: 1 pack/day for 30.0 years (30.0 ttl pk-yrs)   Types: Cigarettes   Start date: 05/17/1959   Quit date: 05/16/1989   Years since quitting: 33.8   Passive exposure: Past  Smokeless Tobacco Never    Goals Met:  Proper associated with RPD/PD & O2 Sat Independence with exercise equipment Exercise tolerated well No report of concerns or symptoms today  Goals Unmet:  Not Applicable  Comments: Pt able to follow exercise prescription today without complaint.  Will continue to monitor for progression.    Dr. Bethann Punches is Medical Director for [facility] Cardiac Rehabilitation.  Dr. Vida Rigger is Medical Director for [facility] Pulmonary Rehabilitation.

## 2023-03-02 ENCOUNTER — Encounter: Payer: Medicare HMO | Admitting: *Deleted

## 2023-03-02 DIAGNOSIS — I5032 Chronic diastolic (congestive) heart failure: Secondary | ICD-10-CM

## 2023-03-02 NOTE — Progress Notes (Signed)
Daily Session Note  Patient Details  Name: Lauren Lloyd MRN: [id number] Date of Birth: Jan 25, 1941 Referring Provider:   Flowsheet Row Pulmonary Rehab from 02/13/2023 in [facility] Cardiac and Pulmonary Rehab  Referring Provider Julien Nordmann, MD       Encounter Date: 03/02/2023  Check In:  Session Check In - 03/02/23 0735       Check-In   Supervising physician immediately available to respond to emergencies See telemetry face sheet for immediately available ER MD    Location [facility]-Cardiac & Pulmonary Rehab    Staff Present Cora Collum, RN, BSN, CCRP;Joseph Hood, RCP,RRT,BSRT;Maxon [place] BS, , Exercise Physiologist;Monica Zahler Katrinka Blazing, RN, [place]    Virtual Visit No    Medication changes reported     No    Fall or balance concerns reported    No    Warm-up and Cool-down Performed on first and last piece of equipment    Resistance Training Performed Yes    VAD Patient? No    PAD/SET Patient? No      Pain Assessment   Currently in Pain? No/denies                Social History   Tobacco Use  Smoking Status Former   Current packs/day: 0.00   Average packs/day: 1 pack/day for 30.0 years (30.0 ttl pk-yrs)   Types: Cigarettes   Start date: 05/17/1959   Quit date: 05/16/1989   Years since quitting: 33.8   Passive exposure: Past  Smokeless Tobacco Never    Goals Met:  Independence with exercise equipment Exercise tolerated well No report of concerns or symptoms today Strength training completed today  Goals Unmet:  Not Applicable  Comments: Pt able to follow exercise prescription today without complaint.  Will continue to monitor for progression.    Dr. Bethann Punches is Medical Director for [facility] Cardiac Rehabilitation.  Dr. Vida Rigger is Medical Director for [facility] Pulmonary Rehabilitation.

## 2023-03-06 ENCOUNTER — Ambulatory Visit: Payer: Medicare HMO | Admitting: Radiation Oncology

## 2023-03-06 ENCOUNTER — Other Ambulatory Visit: Payer: Self-pay | Admitting: Family Medicine

## 2023-03-07 ENCOUNTER — Encounter: Payer: Medicare HMO | Admitting: *Deleted

## 2023-03-07 DIAGNOSIS — I5032 Chronic diastolic (congestive) heart failure: Secondary | ICD-10-CM

## 2023-03-07 NOTE — Telephone Encounter (Signed)
Requested Prescriptions  Pending Prescriptions Disp Refills   allopurinol (ZYLOPRIM) 100 MG tablet [Pharmacy Med Name: ALLOPURINOL 100 MG TAB] 90 tablet 1    Sig: TAKE ONE TABLET BY MOUTH EVERY DAY     Endocrinology:  Gout Agents - allopurinol Failed - 03/06/2023  8:56 AM      Failed - Uric Acid in normal range and within 360 days    Uric Acid  Date Value Ref Range Status  08/25/2022 8.4 (H) 3.1 - 7.9 mg/dL Final    Comment:               Therapeutic target for gout patients: <6.0         Failed - Cr in normal range and within 360 days    Creat  Date Value Ref Range Status  01/19/2017 0.78 0.60 - 0.93 mg/dL Final    Comment:    For patients >28 years of age, the reference limit for Creatinine is approximately 13% higher for people identified as African-American. .    Creatinine, Ser  Date Value Ref Range Status  10/28/2022 1.06 (H) 0.57 - 1.00 mg/dL Final         Passed - Valid encounter within last 12 months    Recent Outpatient Visits           1 month ago Acute cough   [place] [facility] [place], [place], PA-C   5 months ago Essential hypertension   [place] [facility] [place], Marzella Schlein, MD   6 months ago Chronic gout due to renal impairment of multiple sites without tophus   [place] [facility] [place], Marzella Schlein, MD   7 months ago Acute gout due to renal impairment involving toe of right foot   [facility] Merita Norton T, FNP   9 months ago Pain of foot, unspecified laterality   [place] [facility] [place], [place], PA-C       Future Appointments             In 4 weeks Hunsucker, Lesia Sago, MD [place] [employer] [facility] at [facility] - CBC within normal limits and completed in the last 12 months    WBC  Date Value Ref Range Status  07/05/2022 8.5 4.0 - 10.5 K/uL Final   RBC  Date Value Ref Range Status  07/05/2022 4.75  3.87 - 5.11 MIL/uL Final   Hemoglobin  Date Value Ref Range Status  07/05/2022 14.6 12.0 - 15.0 g/dL Final  13/12/6576 46.9 11.1 - 15.9 g/dL Final   HCT  Date Value Ref Range Status  07/05/2022 44.4 36.0 - 46.0 % Final   Hematocrit  Date Value Ref Range Status  11/04/2021 41.7 34.0 - 46.6 % Final   MCHC  Date Value Ref Range Status  07/05/2022 32.9 30.0 - 36.0 g/dL Final   [facility]  Date Value Ref Range Status  07/05/2022 30.7 26.0 - 34.0 pg Final   MCV  Date Value Ref Range Status  07/05/2022 93.5 80.0 - 100.0 fL Final  11/04/2021 90 79 - 97 fL Final   No results found for: "PLTCOUNTKUC", "LABPLAT", "POCPLA" RDW  Date Value Ref Range Status  07/05/2022 14.2 11.5 - 15.5 % Final  11/04/2021 12.7 11.7 - 15.4 % Final

## 2023-03-07 NOTE — Progress Notes (Signed)
Daily Session Note  Patient Details  Name: Lauren Lloyd MRN: [id number] Date of Birth: 11/08/1940 Referring Provider:   Flowsheet Row Pulmonary Rehab from 02/13/2023 in [facility] Cardiac and Pulmonary Rehab  Referring Provider Julien Nordmann, MD       Encounter Date: 03/07/2023  Check In:  Session Check In - 03/07/23 0827       Check-In   Supervising physician immediately available to respond to emergencies See telemetry face sheet for immediately available ER MD    Location [facility]-Cardiac & Pulmonary Rehab    Staff Present Rory Percy, MS, Exercise Physiologist;Arelene Moroni, RN, BSN, CCRP;Noah Tickle, BS, Exercise Physiologist;Maxon Conetta BS, , Exercise Physiologist    Virtual Visit No    Medication changes reported     No    Fall or balance concerns reported    No    Warm-up and Cool-down Performed on first and last piece of equipment    Resistance Training Performed Yes    VAD Patient? No    PAD/SET Patient? No      Pain Assessment   Currently in Pain? No/denies                Social History   Tobacco Use  Smoking Status Former   Current packs/day: 0.00   Average packs/day: 1 pack/day for 30.0 years (30.0 ttl pk-yrs)   Types: Cigarettes   Start date: 05/17/1959   Quit date: 05/16/1989   Years since quitting: 33.8   Passive exposure: Past  Smokeless Tobacco Never    Goals Met:  Proper associated with RPD/PD & O2 Sat Independence with exercise equipment Exercise tolerated well No report of concerns or symptoms today  Goals Unmet:  Not Applicable  Comments: Pt able to follow exercise prescription today without complaint.  Will continue to monitor for progression.    Dr. Bethann Punches is Medical Director for [facility] Cardiac Rehabilitation.  Dr. Vida Rigger is Medical Director for [facility] Pulmonary Rehabilitation.

## 2023-03-13 ENCOUNTER — Ambulatory Visit: Payer: Medicare HMO | Admitting: Radiation Oncology

## 2023-03-14 ENCOUNTER — Encounter: Payer: Medicare HMO | Admitting: *Deleted

## 2023-03-14 DIAGNOSIS — I5032 Chronic diastolic (congestive) heart failure: Secondary | ICD-10-CM

## 2023-03-14 NOTE — Progress Notes (Signed)
Daily Session Note  Patient Details  Name: Lauren Lloyd MRN: [id number] Date of Birth: [birth year] Referring Provider:   Flowsheet Row Pulmonary Rehab from 02/13/2023 in [facility] Cardiac and Pulmonary Rehab  Referring Provider Julien Nordmann, MD       Encounter Date: 03/14/2023  Check In:  Session Check In - 03/14/23 0756       Check-In   Supervising physician immediately available to respond to emergencies See telemetry face sheet for immediately available ER MD    Location [facility]-Cardiac & Pulmonary Rehab    Staff Present Rory Percy, MS, Exercise Physiologist;Lashanti Chambless, RN, BSN, CCRP;Noah Tickle, BS, Exercise Physiologist;Maxon Conetta BS, , Exercise Physiologist    Virtual Visit No    Medication changes reported     No    Fall or balance concerns reported    No    Warm-up and Cool-down Performed on first and last piece of equipment    Resistance Training Performed Yes    VAD Patient? No    PAD/SET Patient? No      Pain Assessment   Currently in Pain? No/denies                Social History   Tobacco Use  Smoking Status Former   Current packs/day: 0.00   Average packs/day: 1 pack/day for 30.0 years (30.0 ttl pk-yrs)   Types: Cigarettes   Start date: 05/17/1959   Quit date: 05/16/1989   Years since quitting: 33.8   Passive exposure: Past  Smokeless Tobacco Never    Goals Met:  Proper associated with RPD/PD & O2 Sat Independence with exercise equipment Exercise tolerated well No report of concerns or symptoms today  Goals Unmet:  Not Applicable  Comments: Pt able to follow exercise prescription today without complaint.  Will continue to monitor for progression.    Dr. Bethann Punches is Medical Director for [facility] Cardiac Rehabilitation.  Dr. Vida Rigger is Medical Director for [facility] Pulmonary Rehabilitation.

## 2023-03-15 ENCOUNTER — Encounter: Payer: Self-pay | Admitting: *Deleted

## 2023-03-15 DIAGNOSIS — I5032 Chronic diastolic (congestive) heart failure: Secondary | ICD-10-CM

## 2023-03-15 NOTE — Progress Notes (Signed)
Pulmonary Individual Treatment Plan  Patient Details  Name: Lauren Lloyd MRN: [id number] Date of Birth: 05-Nov-1940 Referring Provider:   Flowsheet Row Pulmonary Rehab from 02/13/2023 in [facility] Cardiac and Pulmonary Rehab  Referring Provider Julien Nordmann, MD       Initial Encounter Date:  Flowsheet Row Pulmonary Rehab from 02/13/2023 in [facility] Cardiac and Pulmonary Rehab  Date 02/13/23       Visit Diagnosis: Heart failure, diastolic, chronic (HCC)  Patient's Home Medications on Admission:  Current Outpatient Medications:    acetaminophen (TYLENOL) 325 MG tablet, Take 650 mg by mouth every 6 (six) hours as needed for moderate pain., Disp: , Rfl:    allopurinol (ZYLOPRIM) 100 MG tablet, TAKE ONE TABLET BY MOUTH EVERY DAY, Disp: 90 tablet, Rfl: 1   bisoprolol (ZEBETA) 10 MG tablet, Take 1 tablet (10 mg total) by mouth 2 (two) times daily., Disp: 180 tablet, Rfl: 3   calcium carbonate (TUMS EX) 750 MG chewable tablet, Chew 2 tablets by mouth daily as needed for heartburn., Disp: , Rfl:    Cholecalciferol 25 MCG (1000 UT) tablet, Take 1,000 Units by mouth daily., Disp: , Rfl:    ELIQUIS 5 MG TABS tablet, TAKE ONE TABLET TWICE DAILY, Disp: 180 tablet, Rfl: 1   ezetimibe (ZETIA) 10 MG tablet, TAKE 1 TABLET BY MOUTH DAILY, Disp: 90 tablet, Rfl: 3   furosemide (LASIX) 40 MG tablet, TAKE 1 TABLET BY MOUTH DAILY. TAKE AN EXTRA TABLET AS NEEDED AFTER LUNCH FOR ABDOMINAL SWELLING, LEG SWELLING OR SHORTNESS OF BREATH, Disp: 180 tablet, Rfl: 1   gabapentin (NEURONTIN) 600 MG tablet, TAKE ONE TABLET BY MOUTH AT BEDTIME, Disp: 90 tablet, Rfl: 0   levalbuterol (XOPENEX HFA) 45 MCG/ACT inhaler, Inhale 2 puffs into the lungs every 8 (eight) hours as needed for wheezing., Disp: 1 each, Rfl: 2   Menthol, Topical Analgesic, (BIOFREEZE EX), Apply 1 application. topically daily as needed (Neck pain)., Disp: , Rfl:    metaxalone (SKELAXIN) 800 MG tablet, Take 800 mg by mouth daily as needed for muscle spasms.,  Disp: , Rfl:    Multiple Vitamin (MULTIVITAMIN) capsule, Take 1 capsule by mouth daily., Disp: , Rfl:    mupirocin ointment (BACTROBAN) 2 %, Place 1 application  into the nose 2 (two) times daily., Disp: 22 g, Rfl: 0   naltrexone (DEPADE) 50 MG tablet, TAKE 1/2 TABLET BY MOUTH DAILY (Patient not taking: Reported on 01/25/2023), Disp: 15 tablet, Rfl: 2   potassium chloride (KLOR-CON) 10 MEQ tablet, TAKE 1 TABLET BY MOUTH DAILY, Disp: 30 tablet, Rfl: 5   Tiotropium Bromide-Olodaterol (STIOLTO RESPIMAT) 2.5-2.5 MCG/ACT AERS, Inhale 2 puffs into the lungs daily., Disp: 4 g, Rfl: 0  Past Medical History: Past Medical History:  Diagnosis Date   (HFpEF) heart failure with preserved ejection fraction (HCC)    a. 05/2018 Echo: EF 55-60%, no rwma, mild to mod MR. Nl RV fxn. Mod TR. PASP .   Arthritis    knees, Hands   Arthritis of knee    Back pain    Carotid arterial disease (HCC)    a. 03/2019 Carotid U/S: <50% bilat ICA stenoses.   Cholelithiasis    a. 10/2018 noted on CT.   Edema, lower extremity    Fatty liver    GERD (gastroesophageal reflux disease)    History of stress test    a. 06/2018 MV: EF 59%, no ischemia/infarct. Low risk.   Knee pain    Lactose intolerance    Mitral regurgitation  a. 05/2018 Echo: mild to mod MR.   Multinodular goiter    Obesity    OSA (obstructive sleep apnea)    PAF (paroxysmal atrial fibrillation) (HCC)    a.  Diagnosed 12/19; b. 05/2018 s/p DCCV; c. 03/2019 & 05/2019 recurrent AFib-->managed w/ amio load; d. CHADS2VASc = 6 (CHF, HTN, age x 2, vascular disease, female)-->Eliquis & amio 100 qd.   PAH (pulmonary artery hypertension) (HCC)    RSV (acute bronchiolitis due to respiratory syncytial virus) 05/10/2022   Scoliosis    SOB (shortness of breath)    Swallowing difficulty     Tobacco Use: Social History   Tobacco Use  Smoking Status Former   Current packs/day: 0.00   Average packs/day: 1 pack/day for 30.0 years (30.0 ttl pk-yrs)   Types:  Cigarettes   Start date: 05/17/1959   Quit date: 05/16/1989   Years since quitting: 33.8   Passive exposure: Past  Smokeless Tobacco Never    Labs: Review Flowsheet  More data exists      Latest Ref Rng & Units 12/09/2019 08/03/2020 08/16/2021 03/17/2022 10/28/2022  Labs for ITP Cardiac and Pulmonary Rehab  Cholestrol 100 - 199 mg/dL 540  981  CANCELED  191  176   LDL (calc) 0 - 99 mg/dL 478  295  - 93  621   HDL-C >39 mg/dL 73  65  CANCELED  52  54   Trlycerides 0 - 149 mg/dL 308  657  CANCELED  94  126   Hemoglobin A1c 4.8 - 5.6 % 5.5  - - 6.0  6.0     Details             Pulmonary Assessment Scores:  Pulmonary Assessment Scores     Row Name 02/13/23 1645         ADL UCSD   ADL Phase Entry     SOB Score total 57     Rest 0     Walk 3     Stairs 4     Bath 1     Dress 1     Shop 1       CAT Score   CAT Score 23       mMRC Score   mMRC Score 2              UCSD: Self-administered rating of dyspnea associated with activities of daily living (ADLs) 6-point scale (0 = "not at all" to 5 = "maximal or unable to do because of breathlessness")  Scoring Scores range from 0 to 120.  Minimally important difference is 5 units  CAT: CAT can identify the health impairment of COPD patients and is better correlated with disease progression.  CAT has a scoring range of zero to 40. The CAT score is classified into four groups of low (less than 10), medium (10 - 20), high (21-30) and very high (31-40) based on the impact level of disease on health status. A CAT score over 10 suggests significant symptoms.  A worsening CAT score could be explained by an exacerbation, poor medication adherence, poor inhaler technique, or progression of COPD or comorbid conditions.  CAT MCID is 2 points  mMRC: mMRC (Modified Medical Research Council) Dyspnea Scale is used to assess the degree of baseline functional disability in patients of respiratory disease due to dyspnea. No minimal important  difference is established. A decrease in score of 1 point or greater is considered a positive change.   Pulmonary Function Assessment:  Pulmonary Function Assessment - 01/25/23 1416       Breath   Shortness of Breath Yes;Limiting activity             Exercise Target Goals: Exercise Program Goal: Individual exercise prescription set using results from initial 6 min walk test and THRR while considering  patient's activity barriers and safety.   Exercise Prescription Goal: Initial exercise prescription builds to 30-45 minutes a day of aerobic activity, 2-3 days per week.  Home exercise guidelines will be given to patient during program as part of exercise prescription that the participant will acknowledge.  Education: Aerobic Exercise: - Group verbal and visual presentation on the components of exercise prescription. Introduces F.I.T.T principle from ACSM for exercise prescriptions.  Reviews F.I.T.T. principles of aerobic exercise including progression. Written material given at graduation.   Education: Resistance Exercise: - Group verbal and visual presentation on the components of exercise prescription. Introduces F.I.T.T principle from ACSM for exercise prescriptions  Reviews F.I.T.T. principles of resistance exercise including progression. Written material given at graduation.    Education: Exercise & Equipment Safety: - Individual verbal instruction and demonstration of equipment use and safety with use of the equipment. Flowsheet Row Pulmonary Rehab from 02/16/2023 in [facility] Cardiac and Pulmonary Rehab  Date 02/13/23  Educator MB  Instruction Review Code 1- Verbalizes Understanding       Education: Exercise Physiology & General Exercise Guidelines: - Group verbal and written instruction with models to review the exercise physiology of the cardiovascular system and associated critical values. Provides general exercise guidelines with specific guidelines to those with heart or  lung disease.  Flowsheet Row Pulmonary Rehab from 02/16/2023 in [facility] Cardiac and Pulmonary Rehab  Education need identified 02/13/23       Education: Flexibility, Balance, Mind/Body Relaxation: - Group verbal and visual presentation with interactive activity on the components of exercise prescription. Introduces F.I.T.T principle from ACSM for exercise prescriptions. Reviews F.I.T.T. principles of flexibility and balance exercise training including progression. Also discusses the mind body connection.  Reviews various relaxation techniques to help reduce and manage stress (i.e. Deep breathing, progressive muscle relaxation, and visualization). Balance handout provided to take home. Written material given at graduation.   Activity Barriers & Risk Stratification:  Activity Barriers & Cardiac Risk Stratification - 02/13/23 1640       Activity Barriers & Cardiac Risk Stratification   Activity Barriers Arthritis;Back Problems;Shortness of Breath             6 Minute Walk:  6 Minute Walk     Row Name 02/13/23 1638         6 Minute Walk   Phase Initial     Distance 945 feet     Walk Time 6 minutes     # of Rest Breaks 0     MPH 1.79     METS 1.54     RPE 15     Perceived Dyspnea  3     VO2 Peak 5.41     Symptoms Yes (comment)     Comments back pain     Resting HR 70 bpm     Resting BP 130/74     Resting Oxygen Saturation  92 %     Exercise Oxygen Saturation  during 6 min walk 91 %     Max Ex. HR 111 bpm     Max Ex. BP 166/72     2 Minute Post BP 150/74       Interval HR  1 Minute HR 103     2 Minute HR 107     3 Minute HR 109     4 Minute HR 111     5 Minute HR 82     6 Minute HR 83     2 Minute Post HR 70     Interval Heart Rate? Yes       Interval Oxygen   Interval Oxygen? Yes     Baseline Oxygen Saturation % 92 %     1 Minute Oxygen Saturation % 91 %     1 Minute Liters of Oxygen 0 L     2 Minute Oxygen Saturation % 92 %     2 Minute Liters of Oxygen 0 L      3 Minute Oxygen Saturation % 91 %     3 Minute Liters of Oxygen 0 L     4 Minute Oxygen Saturation % 91 %     4 Minute Liters of Oxygen 0 L     5 Minute Oxygen Saturation % 95 %     5 Minute Liters of Oxygen 0 L     6 Minute Oxygen Saturation % 93 %     6 Minute Liters of Oxygen 0 L     2 Minute Post Oxygen Saturation % 96 %     2 Minute Post Liters of Oxygen 0 L             Oxygen Initial Assessment:  Oxygen Initial Assessment - 01/25/23 1415       Home Oxygen   Home Oxygen Device None    Sleep Oxygen Prescription CPAP    Liters per minute --   off and on   Home Exercise Oxygen Prescription None    Home Resting Oxygen Prescription None    Compliance with Home Oxygen Use Yes      Initial 6 min Walk   Oxygen Used None      Program Oxygen Prescription   Program Oxygen Prescription None      Intervention   Short Term Goals To learn and exhibit compliance with exercise, home and travel O2 prescription;To learn and understand importance of monitoring SPO2 with pulse oximeter and demonstrate accurate use of the pulse oximeter.;To learn and understand importance of maintaining oxygen saturations>88%;To learn and demonstrate proper pursed lip breathing techniques or other breathing techniques. ;To learn and demonstrate proper use of respiratory medications    Long  Term Goals Exhibits compliance with exercise, home  and travel O2 prescription;Verbalizes importance of monitoring SPO2 with pulse oximeter and return demonstration;Maintenance of O2 saturations>88%;Exhibits proper breathing techniques, such as pursed lip breathing or other method taught during program session;Compliance with respiratory medication;Demonstrates proper use of MDI's             Oxygen Re-Evaluation:  Oxygen Re-Evaluation     Row Name 02/16/23 0813 03/02/23 0802           Program Oxygen Prescription   Program Oxygen Prescription -- None        Home Oxygen   Home Oxygen Device -- None       Sleep Oxygen Prescription -- CPAP      Home Exercise Oxygen Prescription -- None      Home Resting Oxygen Prescription -- None      Compliance with Home Oxygen Use -- No        Goals/Expected Outcomes   Short Term Goals -- To learn and demonstrate proper pursed lip  breathing techniques or other breathing techniques.       Long  Term Goals -- Exhibits proper breathing techniques, such as pursed lip breathing or other method taught during program session      Comments Reviewed PLB technique with pt.  Talked about how it works and it's importance in maintaining their exercise saturations. Informed patient how to perform the Pursed Lipped breathing technique. Told patient to Inhale through the nose and out the mouth with pursed lips to keep their airways open, help oxygenate them better, practice when at rest or doing strenuous activity. Patient Verbalizes understanding of technique and will work on and be reiterated during [facility].      Goals/Expected Outcomes Short: Become more profiecient at using PLB. Long: Become independent at using PLB. Short: use PLB with exertion. Long: use PLB on exertion proficiently and independently.               Oxygen Discharge (Final Oxygen Re-Evaluation):  Oxygen Re-Evaluation - 03/02/23 0802       Program Oxygen Prescription   Program Oxygen Prescription None      Home Oxygen   Home Oxygen Device None    Sleep Oxygen Prescription CPAP    Home Exercise Oxygen Prescription None    Home Resting Oxygen Prescription None    Compliance with Home Oxygen Use No      Goals/Expected Outcomes   Short Term Goals To learn and demonstrate proper pursed lip breathing techniques or other breathing techniques.     Long  Term Goals Exhibits proper breathing techniques, such as pursed lip breathing or other method taught during program session    Comments Informed patient how to perform the Pursed Lipped breathing technique. Told patient to Inhale through the nose and  out the mouth with pursed lips to keep their airways open, help oxygenate them better, practice when at rest or doing strenuous activity. Patient Verbalizes understanding of technique and will work on and be reiterated during [facility].    Goals/Expected Outcomes Short: use PLB with exertion. Long: use PLB on exertion proficiently and independently.             Initial Exercise Prescription:  Initial Exercise Prescription - 02/13/23 1600       Date of Initial Exercise RX and Referring Provider   Date 02/13/23    Referring Provider Julien Nordmann, MD      Oxygen   Maintain Oxygen Saturation 88% or higher      Recumbant Bike   Level 1    RPM 50    Watts 14    Minutes 15    METs 1.54      NuStep   Level 1    SPM 80    Minutes 15    METs 1.54      Biostep-RELP   Level 1    SPM 50    Minutes 15    METs 1.54      Track   Laps 8    Minutes 15    METs 1.44      Prescription Details   Frequency (times per week) 2    Duration Progress to 30 minutes of continuous aerobic without signs/symptoms of physical distress      Intensity   THRR 40-80% of Max Heartrate 96-123    Ratings of Perceived Exertion 11-13    Perceived Dyspnea 0-4      Progression   Progression Continue to progress workloads to maintain intensity without signs/symptoms of physical distress.  Resistance Training   Training Prescription Yes    Weight 5lb    Reps 10-15             Perform Capillary Blood Glucose checks as needed.  Exercise Prescription Changes:   Exercise Prescription Changes     Row Name 02/13/23 1600 02/22/23 1100 03/09/23 0800         Response to Exercise   Blood Pressure (Admit) 130/74 146/74 164/82     Blood Pressure (Exercise) 166/72 116/70 148/62     Blood Pressure (Exit) 130/74 130/76 120/58     Heart Rate (Admit) 70 bpm 70 bpm 72 bpm     Heart Rate (Exercise) 111 bpm 81 bpm 123 bpm     Heart Rate (Exit) 70 bpm 70 bpm 72 bpm     Oxygen Saturation (Admit)  92 % 96 % 99 %     Oxygen Saturation (Exercise) 91 % 92 % 92 %     Oxygen Saturation (Exit) 94 % 95 % 95 %     Rating of Perceived Exertion (Exercise) 15 14 13      Perceived Dyspnea (Exercise) 3 3 2      Symptoms back pain none none     Comments results -- --     Duration Progress to 30 minutes of  aerobic without signs/symptoms of physical distress Progress to 30 minutes of  aerobic without signs/symptoms of physical distress Progress to 30 minutes of  aerobic without signs/symptoms of physical distress     Intensity THRR New THRR unchanged THRR unchanged       Progression   Progression Continue to progress workloads to maintain intensity without signs/symptoms of physical distress. Continue to progress workloads to maintain intensity without signs/symptoms of physical distress. Continue to progress workloads to maintain intensity without signs/symptoms of physical distress.     Average METs 1.54 2.05 1.97       Resistance Training   Training Prescription -- Yes Yes     Weight -- 5lb 5lb     Reps -- 10-15 10-15       Interval Training   Interval Training -- No No       NuStep   Level -- -- 2     Minutes -- -- 15     METs -- -- 2.1       Biostep-RELP   Level -- 1 1     Minutes -- 15 15     METs -- 2 2       Track   Laps -- 20  Hallway 15  Hallway     Minutes -- 15 15     METs -- 2.09 1.82       Oxygen   Maintain Oxygen Saturation -- 88% or higher 88% or higher              Exercise Comments:   Exercise Comments     Row Name 02/16/23 [phone]           Exercise Comments First full day of exercise!  Patient was oriented to gym and equipment including functions, settings, policies, and procedures.  Patient's individual exercise prescription and treatment plan were reviewed.  All starting workloads were established based on the results of the 6 minute walk test done at initial orientation visit.  The plan for exercise progression was also introduced and progression  will be customized based on patient's performance and goals.  Exercise Goals and Review:   Exercise Goals     Row Name 02/13/23 1644             Exercise Goals   Increase Physical Activity Yes       Intervention Provide advice, education, support and counseling about physical activity/exercise needs.;Develop an individualized exercise prescription for aerobic and resistive training based on initial evaluation findings, risk stratification, comorbidities and participant's personal goals.       Expected Outcomes Short Term: Attend rehab on a regular basis to increase amount of physical activity.;Long Term: Exercising regularly at least 3-5 days a week.;Long Term: Add in home exercise to make exercise part of routine and to increase amount of physical activity.       Increase Strength and Stamina Yes       Intervention Provide advice, education, support and counseling about physical activity/exercise needs.;Develop an individualized exercise prescription for aerobic and resistive training based on initial evaluation findings, risk stratification, comorbidities and participant's personal goals.       Expected Outcomes Short Term: Increase workloads from initial exercise prescription for resistance, speed, and METs.;Short Term: Perform resistance training exercises routinely during rehab and add in resistance training at home;Long Term: Improve cardiorespiratory fitness, muscular endurance and strength as measured by increased METs and functional capacity ( )       Able to understand and use rate of perceived exertion (RPE) scale Yes       Intervention Provide education and explanation on how to use RPE scale       Expected Outcomes Short Term: Able to use RPE daily in rehab to express subjective intensity level;Long Term:  Able to use RPE to guide intensity level when exercising independently       Able to understand and use Dyspnea scale Yes       Intervention Provide  education and explanation on how to use Dyspnea scale       Expected Outcomes Short Term: Able to use Dyspnea scale daily in rehab to express subjective sense of shortness of breath during exertion;Long Term: Able to use Dyspnea scale to guide intensity level when exercising independently       Knowledge and understanding of Target Heart Rate Range (THRR) Yes       Intervention Provide education and explanation of THRR including how the numbers were predicted and where they are located for reference       Expected Outcomes Short Term: Able to state/look up THRR;Long Term: Able to use THRR to govern intensity when exercising independently;Short Term: Able to use daily as guideline for intensity in rehab       Able to check pulse independently Yes       Intervention Provide education and demonstration on how to check pulse in carotid and radial arteries.;Review the importance of being able to check your own pulse for safety during independent exercise       Expected Outcomes Short Term: Able to explain why pulse checking is important during independent exercise;Long Term: Able to check pulse independently and accurately       Understanding of Exercise Prescription Yes       Intervention Provide education, explanation, and written materials on patient's individual exercise prescription       Expected Outcomes Short Term: Able to explain program exercise prescription;Long Term: Able to explain home exercise prescription to exercise independently                Exercise Goals Re-Evaluation :  Exercise  Goals Re-Evaluation     Row Name 02/16/23 9562 02/22/23 1122 03/09/23 0855         Exercise Goal Re-Evaluation   Exercise Goals Review Able to understand and use rate of perceived exertion (RPE) scale;Able to understand and use Dyspnea scale;Knowledge and understanding of Target Heart Rate Range (THRR);Understanding of Exercise Prescription Increase Physical Activity;Increase Strength and  Stamina;Understanding of Exercise Prescription Increase Physical Activity;Increase Strength and Stamina;Understanding of Exercise Prescription     Comments Reviewed RPE and dyspnea scale, THR and program prescription with pt today.  Pt voiced understanding and was given a copy of goals to take home. Terrace is off to a good start in the program. She has only attended one session during this review. She was able to increase her track laps from 8 to 20 in 15 minutes, and used the biostep at level 1. We will continue to monitor her progress in the program. Amanee is doing well in rehab. She recently was able to increase her level on the T4 nustep from level 1 to 2. She has maintained her intensity on the biostep at level 1. We will continue to monitor her progress in the program.     Expected Outcomes Short: Use RPE daily to regulate intensity. Long: Follow program prescription in THR. Short: Continue to follow current exercise prescription, and progressively increase workloads. Long: Continue exercise to improve strength and stamina. Short: Continue to follow current exercise prescription, and progressively increase workloads. Long: Continue exercise to improve strength and stamina.              Discharge Exercise Prescription (Final Exercise Prescription Changes):  Exercise Prescription Changes - 03/09/23 0800       Response to Exercise   Blood Pressure (Admit) 164/82    Blood Pressure (Exercise) 148/62    Blood Pressure (Exit) 120/58    Heart Rate (Admit) 72 bpm    Heart Rate (Exercise) 123 bpm    Heart Rate (Exit) 72 bpm    Oxygen Saturation (Admit) 99 %    Oxygen Saturation (Exercise) 92 %    Oxygen Saturation (Exit) 95 %    Rating of Perceived Exertion (Exercise) 13    Perceived Dyspnea (Exercise) 2    Symptoms none    Duration Progress to 30 minutes of  aerobic without signs/symptoms of physical distress    Intensity THRR unchanged      Progression   Progression Continue to progress  workloads to maintain intensity without signs/symptoms of physical distress.    Average METs 1.97      Resistance Training   Training Prescription Yes    Weight 5lb    Reps 10-15      Interval Training   Interval Training No      NuStep   Level 2    Minutes 15    METs 2.1      Biostep-RELP   Level 1    Minutes 15    METs 2      Track   Laps 15   Hallway   Minutes 15    METs 1.82      Oxygen   Maintain Oxygen Saturation 88% or higher             Nutrition:  Target Goals: Understanding of nutrition guidelines, daily intake of sodium 1500mg , cholesterol 200mg , calories 30% from fat and 7% or less from saturated fats, daily to have 5 or more servings of fruits and vegetables.  Education: All About Nutrition: -Group  instruction provided by verbal, written material, interactive activities, discussions, models, and posters to present general guidelines for heart healthy nutrition including fat, fiber, MyPlate, the role of sodium in heart healthy nutrition, utilization of the nutrition label, and utilization of this knowledge for meal planning. Follow up email sent as well. Written material given at graduation.   Biometrics:  Pre Biometrics - 02/13/23 1644       Pre Biometrics   Height 5' 6.2" (1.681 m)    Weight 216 lb 1.6 oz (98 kg)    Waist Circumference 45.5 inches    Hip Circumference 50 inches    Waist to Hip Ratio 0.91 %    BMI (Calculated) 34.69    Single Leg Stand 1.5 seconds              Nutrition Therapy Plan and Nutrition Goals:  Nutrition Therapy & Goals - 02/16/23 0947       Nutrition Therapy   Diet Cardiac, low na    Protein (specify units) 90    Fiber 25 grams    Whole Grain Foods 3 servings    Saturated Fats 15 max. grams    Fruits and Vegetables 5 servings/day    Sodium 2 grams      Personal Nutrition Goals   Nutrition Goal Look into healthy snacks to help be consistent in not missing meals    Personal Goal #2 Eat a protein at  every meals    Comments Patient drinking 24oz of water daily, has been drinking this much to avoid fluid on her chest, making it hard to breath. Spoke to her about watching her sodium intake as well. She reads labels and is knowledgeable of basics on nutrition. Reviewed Mediterranean diet handout, educated on types of fats, sources, and how to read them on label. Encouraged more veggies at larger meals or when eating poor food choices like fried chicken. She reports that sometimes she gets busy and misses meals. Recommended several smaller meals and snacks with quick grab and go friendly foods to try and be more consistent. Overall, she is doing well and is knowledgeable of how to build balanced plates      Intervention Plan   Intervention Prescribe, educate and counsel regarding individualized specific dietary modifications aiming towards targeted core components such as weight, hypertension, lipid management, diabetes, heart failure and other comorbidities.;Nutrition handout(s) given to patient.    Expected Outcomes Short Term Goal: Understand basic principles of dietary content, such as calories, fat, sodium, cholesterol and nutrients.;Short Term Goal: A plan has been developed with personal nutrition goals set during dietitian appointment.;Long Term Goal: Adherence to prescribed nutrition plan.             Nutrition Assessments:  MEDIFICTS Score Key: >=70 Need to make dietary changes  40-70 Heart Healthy Diet <= 40 Therapeutic Level Cholesterol Diet  Flowsheet Row Pulmonary Rehab from 02/13/2023 in [facility]  Picture Your Plate Total Score on Admission 57      Picture Your Plate Scores: <78 Unhealthy dietary pattern with much room for improvement. 41-50 Dietary pattern unlikely to meet recommendations for good health and room for improvement. 51-60 More healthful dietary pattern, with some room for improvement.  >60 Healthy dietary pattern, although there may be  some specific behaviors that could be improved.   Nutrition Goals Re-Evaluation:  Nutrition Goals Re-Evaluation     Row Name 03/02/23 0804             Goals  Current Weight 218 lb (98.9 kg)       Comment Patient was informed on why it is important to maintain a balanced diet when dealing with Respiratory issues. Explained that it takes a lot of energy to breath and when they are short of breath often they will need to have a good diet to help keep up with the calories they are expending for breathing.       Expected Outcome Short: Choose and plan snacks accordingly to patients caloric intake to improve breathing. Long: Maintain a diet independently that meets their caloric intake to aid in daily shortness of breath.                Nutrition Goals Discharge (Final Nutrition Goals Re-Evaluation):  Nutrition Goals Re-Evaluation - 03/02/23 0804       Goals   Current Weight 218 lb (98.9 kg)    Comment Patient was informed on why it is important to maintain a balanced diet when dealing with Respiratory issues. Explained that it takes a lot of energy to breath and when they are short of breath often they will need to have a good diet to help keep up with the calories they are expending for breathing.    Expected Outcome Short: Choose and plan snacks accordingly to patients caloric intake to improve breathing. Long: Maintain a diet independently that meets their caloric intake to aid in daily shortness of breath.             Psychosocial: Target Goals: Acknowledge presence or absence of significant depression and/or stress, maximize coping skills, provide positive support system. Participant is able to verbalize types and ability to use techniques and skills needed for reducing stress and depression.   Education: Stress, Anxiety, and Depression - Group verbal and visual presentation to define topics covered.  Reviews how body is impacted by stress, anxiety, and depression.  Also  discusses healthy ways to reduce stress and to treat/manage anxiety and depression.  Written material given at graduation.   Education: Sleep Hygiene -Provides group verbal and written instruction about how sleep can affect your health.  Define sleep hygiene, discuss sleep cycles and impact of sleep habits. Review good sleep hygiene tips.    Initial Review & Psychosocial Screening:  Initial Psych Review & Screening - 01/25/23 1419       Initial Review   Current issues with None Identified      Family Dynamics   Good Support System? Yes    Comments She can look to her two sons and a daughter that lives here. She lives alone but is able to call her family if she needs help.      Barriers   Psychosocial barriers to participate in program The patient should benefit from training in stress management and relaxation.;There are no identifiable barriers or psychosocial needs.      Screening Interventions   Interventions Encouraged to exercise;To provide support and resources with identified psychosocial needs;Provide feedback about the scores to participant    Expected Outcomes Short Term goal: Utilizing psychosocial counselor, staff and physician to assist with identification of specific Stressors or current issues interfering with healing process. Setting desired goal for each stressor or current issue identified.;Long Term Goal: Stressors or current issues are controlled or eliminated.;Short Term goal: Identification and review with participant of any Quality of Life or Depression concerns found by scoring the questionnaire.;Long Term goal: The participant improves quality of Life and PHQ9 Scores as seen by post scores and/or  verbalization of changes             Quality of Life Scores:  Scores of 19 and below usually indicate a poorer quality of life in these areas.  A difference of  2-3 points is a clinically meaningful difference.  A difference of 2-3 points in the total score of the  Quality of Life Index has been associated with significant improvement in overall quality of life, self-image, physical symptoms, and general health in studies assessing change in quality of life.  PHQ-9: Review Flowsheet  More data exists      02/16/2023 01/11/2023 10/03/2022 08/11/2022 08/03/2022  Depression screen PHQ 2/9  Decreased Interest 0 0 0 0 0  Down, Depressed, Hopeless 0 0 0 0 0  PHQ - 2 Score 0 0 0 0 0  Altered sleeping 0 - 1 1 0  Tired, decreased energy 1 - 2 2 3   Change in appetite 1 - 2 2 0  Feeling bad or failure about yourself  0 - 0 0 0  Trouble concentrating 0 - 0 0 0  Moving slowly or fidgety/restless 0 - 0 0 0  Suicidal thoughts 0 - 0 0 0  PHQ-9 Score 2 - 5 5 3   Difficult doing work/chores Not difficult at all - Not difficult at all Not difficult at all Not difficult at all    Details           Interpretation of Total Score  Total Score Depression Severity:  1-4 = Minimal depression, 5-9 = Mild depression, 10-14 = Moderate depression, 15-19 = Moderately severe depression, 20-27 = Severe depression   Psychosocial Evaluation and Intervention:  Psychosocial Evaluation - 01/25/23 1420       Psychosocial Evaluation & Interventions   Interventions Relaxation education;Stress management education;Encouraged to exercise with the program and follow exercise prescription    Comments She can look to her two sons and a daughter that lives here. She lives alone but is able to call her family if she needs help.    Expected Outcomes Short: Start [facility] to help with mood. Long: Maintain a healthy mental state.    Continue Psychosocial Services  Follow up required by staff             Psychosocial Re-Evaluation:  Psychosocial Re-Evaluation     Row Name 03/02/23 0805             Psychosocial Re-Evaluation   Current issues with None Identified       Comments Patient reports no issues with their current mental states, sleep, stress, depression or anxiety.  Will follow up with patient in a few weeks for any changes.       Expected Outcomes Short: Continue to exercise regularly to support mental health and notify staff of any changes. Long: maintain mental health and well being through teaching of rehab or prescribed medications independently.       Interventions Encouraged to attend Pulmonary Rehabilitation for the exercise       Continue Psychosocial Services  Follow up required by staff                Psychosocial Discharge (Final Psychosocial Re-Evaluation):  Psychosocial Re-Evaluation - 03/02/23 0805       Psychosocial Re-Evaluation   Current issues with None Identified    Comments Patient reports no issues with their current mental states, sleep, stress, depression or anxiety. Will follow up with patient in a few weeks for any changes.    Expected  Outcomes Short: Continue to exercise regularly to support mental health and notify staff of any changes. Long: maintain mental health and well being through teaching of rehab or prescribed medications independently.    Interventions Encouraged to attend Pulmonary Rehabilitation for the exercise    Continue Psychosocial Services  Follow up required by staff             Education: Education Goals: Education classes will be provided on a weekly basis, covering required topics. Participant will state understanding/return demonstration of topics presented.  Learning Barriers/Preferences:  Learning Barriers/Preferences - 01/25/23 1416       Learning Barriers/Preferences   Learning Barriers None    Learning Preferences None             General Pulmonary Education Topics:  Infection Prevention: - Provides verbal and written material to individual with discussion of infection control including proper hand washing and proper equipment cleaning during exercise session. Flowsheet Row Pulmonary Rehab from 02/16/2023 in [facility] Cardiac and Pulmonary Rehab  Date 02/13/23  Educator MB   Instruction Review Code 1- Verbalizes Understanding       Falls Prevention: - Provides verbal and written material to individual with discussion of falls prevention and safety. Flowsheet Row Pulmonary Rehab from 02/16/2023 in [facility] Cardiac and Pulmonary Rehab  Date 02/13/23  Educator MB  Instruction Review Code 1- Verbalizes Understanding       Chronic Lung Disease Review: - Group verbal instruction with posters, models, PowerPoint presentations and videos,  to review new updates, new respiratory medications, new advancements in procedures and treatments. Providing information on websites and "800" numbers for continued self-education. Includes information about supplement oxygen, available portable oxygen systems, continuous and intermittent flow rates, oxygen safety, concentrators, and Medicare reimbursement for oxygen. Explanation of Pulmonary Drugs, including class, frequency, complications, importance of spacers, rinsing mouth after steroid MDI's, and proper cleaning methods for nebulizers. Review of basic lung anatomy and physiology related to function, structure, and complications of lung disease. Review of risk factors. Discussion about methods for diagnosing sleep apnea and types of masks and machines for OSA. Includes a review of the use of types of environmental controls: home humidity, furnaces, filters, dust mite/pet prevention, HEPA vacuums. Discussion about weather changes, air quality and the benefits of nasal washing. Instruction on Warning signs, infection symptoms, calling MD promptly, preventive modes, and value of vaccinations. Review of effective airway clearance, coughing and/or vibration techniques. Emphasizing that all should Create an Action Plan. Written material given at graduation. Flowsheet Row Pulmonary Rehab from 02/16/2023 in [facility] Cardiac and Pulmonary Rehab  Education need identified 02/13/23       AED/CPR: - Group verbal and written instruction with the use  of models to demonstrate the basic use of the AED with the basic ABC's of resuscitation.    Anatomy and Cardiac Procedures: - Group verbal and visual presentation and models provide information about basic cardiac anatomy and function. Reviews the testing methods done to diagnose heart disease and the outcomes of the test results. Describes the treatment choices: Medical Management, Angioplasty, or Coronary Bypass Surgery for treating various heart conditions including Myocardial Infarction, Angina, Valve Disease, and Cardiac Arrhythmias.  Written material given at graduation.   Medication Safety: - Group verbal and visual instruction to review commonly prescribed medications for heart and lung disease. Reviews the medication, class of the drug, and side effects. Includes the steps to properly store meds and maintain the prescription regimen.  Written material given at graduation.   Other: -Provides  group and verbal instruction on various topics (see comments)   Knowledge Questionnaire Score:  Knowledge Questionnaire Score - 02/13/23 1651       Knowledge Questionnaire Score   Pre Score 13/18              Core Components/Risk Factors/Patient Goals at Admission:  Personal Goals and Risk Factors at Admission - 02/13/23 1655       Core Components/Risk Factors/Patient Goals on Admission    Weight Management Yes;Weight Loss    Intervention Weight Management: Develop a combined nutrition and exercise program designed to reach desired caloric intake, while maintaining appropriate intake of nutrient and fiber, sodium and fats, and appropriate energy expenditure required for the weight goal.;Weight Management: Provide education and appropriate resources to help participant work on and attain dietary goals.;Weight Management/Obesity: Establish reasonable short term and long term weight goals.;Obesity: Provide education and appropriate resources to help participant work on and attain dietary  goals.    Admit Weight 216 lb 1.6 oz (98 kg)    Goal Weight: Short Term 206 lb 1.6 oz (93.5 kg)    Goal Weight: Long Term 196 lb 1.6 oz (89 kg)    Expected Outcomes Short Term: Continue to assess and modify interventions until short term weight is achieved;Long Term: Adherence to nutrition and physical activity/exercise program aimed toward attainment of established weight goal;Weight Maintenance: Understanding of the daily nutrition guidelines, which includes 25-35% calories from fat, 7% or less cal from saturated fats, less than 200mg  cholesterol, less than 1.5gm of sodium, & 5 or more servings of fruits and vegetables daily;Weight Loss: Understanding of general recommendations for a balanced deficit meal plan, which promotes 1-2 lb weight loss per week and includes a negative energy balance of [phone] kcal/d;Understanding recommendations for meals to include 15-35% energy as protein, 25-35% energy from fat, 35-60% energy from carbohydrates, less than 200mg  of dietary cholesterol, 20-35 gm of total fiber daily;Understanding of distribution of calorie intake throughout the day with the consumption of 4-5 meals/snacks    Improve shortness of breath with ADL's Yes    Intervention Provide education, individualized exercise plan and daily activity instruction to help decrease symptoms of SOB with activities of daily living.    Expected Outcomes Short Term: Improve cardiorespiratory fitness to achieve a reduction of symptoms when performing ADLs;Long Term: Be able to perform more ADLs without symptoms or delay the onset of symptoms    Heart Failure Yes    Intervention Provide a combined exercise and nutrition program that is supplemented with education, support and counseling about heart failure. Directed toward relieving symptoms such as shortness of breath, decreased exercise tolerance, and extremity edema.    Expected Outcomes Improve functional capacity of life;Short term: Attendance in program 2-3 days a  week with increased exercise capacity. Reported lower sodium intake. Reported increased fruit and vegetable intake. Reports medication compliance.;Short term: Daily weights obtained and reported for increase. Utilizing diuretic protocols set by physician.;Long term: Adoption of self-care skills and reduction of barriers for early signs and symptoms recognition and intervention leading to self-care maintenance.    Hypertension Yes    Intervention Provide education on lifestyle modifcations including regular physical activity/exercise, weight management, moderate sodium restriction and increased consumption of fresh fruit, vegetables, and low fat dairy, alcohol moderation, and smoking cessation.;Monitor prescription use compliance.    Expected Outcomes Short Term: Continued assessment and intervention until BP is < 140/92mm HG in hypertensive participants. < 130/63mm HG in hypertensive participants with diabetes, heart failure or chronic  kidney disease.;Long Term: Maintenance of blood pressure at goal levels.             Education:Diabetes - Individual verbal and written instruction to review signs/symptoms of diabetes, desired ranges of glucose level fasting, after meals and with exercise. Acknowledge that pre and post exercise glucose checks will be done for 3 sessions at entry of program.   Know Your Numbers and Heart Failure: - Group verbal and visual instruction to discuss disease risk factors for cardiac and pulmonary disease and treatment options.  Reviews associated critical values for Overweight/Obesity, Hypertension, Cholesterol, and Diabetes.  Discusses basics of heart failure: signs/symptoms and treatments.  Introduces Heart Failure Zone chart for action plan for heart failure.  Written material given at graduation.   Core Components/Risk Factors/Patient Goals Review:   Goals and Risk Factor Review     Row Name 03/02/23 0805             Core Components/Risk Factors/Patient Goals  Review   Personal Goals Review Improve shortness of breath with ADL's       Review Spoke to patient about their shortness of breath and what they can do to improve. Patient has been informed of breathing techniques when starting the program. Patient is informed to tell staff if they have had any med changes and that certain meds they are taking or not taking can be causing shortness of breath.       Expected Outcomes Short: Attend [facility] regularly to improve shortness of breath with ADL's. Long: maintain independence with ADL's                Core Components/Risk Factors/Patient Goals at Discharge (Final Review):   Goals and Risk Factor Review - 03/02/23 0805       Core Components/Risk Factors/Patient Goals Review   Personal Goals Review Improve shortness of breath with ADL's    Review Spoke to patient about their shortness of breath and what they can do to improve. Patient has been informed of breathing techniques when starting the program. Patient is informed to tell staff if they have had any med changes and that certain meds they are taking or not taking can be causing shortness of breath.    Expected Outcomes Short: Attend [facility] regularly to improve shortness of breath with ADL's. Long: maintain independence with ADL's             ITP Comments:  ITP Comments     Row Name 01/25/23 1418 02/13/23 1638 02/15/23 1214 02/16/23 0811 03/15/23 1304   ITP Comments Virtual Visit completed. Patient informed on EP and RD appointment and 6 Minute walk test. Patient also informed of patient health questionnaires on My Chart. Patient Verbalizes understanding. Visit diagnosis can be found in [facility] 01/03/[facility]23. Completed and gym orientation. Initial ITP created and sent for review to Dr. Jinny Sanders, Medical Director. 30 Day review completed. Medical Director ITP review done, changes made as directed, and signed approval by Medical Director.   new to program First full day of exercise!   Patient was oriented to gym and equipment including functions, settings, policies, and procedures.  Patient's individual exercise prescription and treatment plan were reviewed.  All starting workloads were established based on the results of the 6 minute walk test done at initial orientation visit.  The plan for exercise progression was also introduced and progression will be customized based on patient's performance and goals. 30 Day review completed. Medical Director ITP review done, changes made as directed, and  signed approval by Medical Director.    new to program            Comments:

## 2023-03-16 ENCOUNTER — Ambulatory Visit (INDEPENDENT_AMBULATORY_CARE_PROVIDER_SITE_OTHER): Payer: Medicare HMO | Admitting: Family Medicine

## 2023-03-16 ENCOUNTER — Encounter: Payer: Self-pay | Admitting: Family Medicine

## 2023-03-16 ENCOUNTER — Encounter: Payer: Medicare HMO | Admitting: *Deleted

## 2023-03-16 VITALS — BP 131/74 | HR 91 | Temp 98.2°F | Resp 16 | Ht 67.0 in | Wt 222.8 lb

## 2023-03-16 DIAGNOSIS — H538 Other visual disturbances: Secondary | ICD-10-CM | POA: Diagnosis not present

## 2023-03-16 DIAGNOSIS — R0789 Other chest pain: Secondary | ICD-10-CM

## 2023-03-16 DIAGNOSIS — I5032 Chronic diastolic (congestive) heart failure: Secondary | ICD-10-CM

## 2023-03-16 NOTE — Progress Notes (Signed)
Acute Office Visit  Subjective:     Patient ID: Lauren Lloyd, female    DOB: [birth year], 82 y.o.   MRN: [id number]  Chief Complaint  Patient presents with   Eye Strain    Eye sight seemed blank/blurry     HPI Discussed the use of AI scribe software for clinical note transcription with the patient, who gave verbal consent to proceed.  History of Present Illness   The patient, with a history of atrial fibrillation and hip replacement, presents with a brief episode of blurred vision that occurred five days ago. The episode lasted only a few seconds and resolved spontaneously. The patient describes the vision loss as a blank spot on one side of her vision. She denies any associated symptoms such as slurred speech, imbalance, weakness, numbness, or facial droop. [name] daughter witnessed the episode and advised the patient to seek medical attention. The patient has not experienced any similar episodes since.  In addition to the vision episode, the patient reports recent intermittent chest tightness. The tightness comes and goes, lasting only a few minutes each time. The patient denies any associated shortness of breath, and the tightness does not seem to be triggered by physical activity. The patient has been participating in pulmonary rehabilitation and has not experienced the chest tightness during her exercise sessions. The patient also reports feeling generally better and having more energy since starting magnesium supplementation for sleep.       ROS      Objective:    BP 131/74 (BP Location: Left Arm, Patient Position: Sitting, Cuff Size: Normal)   Pulse 91   Temp 98.2 F (36.8 C)   Resp 16   Ht 5\' 7"  (1.702 m)   Wt 222 lb 12.8 oz (101.1 kg)   SpO2 95%   BMI 34.90 kg/m    Physical Exam Vitals reviewed.  Constitutional:      General: She is not in acute distress.    Appearance: Normal appearance. She is well-developed. She is not diaphoretic.  HENT:     Head:  Normocephalic and atraumatic.  Eyes:     General: No scleral icterus.    Extraocular Movements: Extraocular movements intact.     Conjunctiva/sclera: Conjunctivae normal.     Pupils: Pupils are equal, round, and reactive to light.     Comments: Visual fields intact  Neck:     Thyroid: No thyromegaly.  Cardiovascular:     Rate and Rhythm: Normal rate and regular rhythm.     Heart sounds: Normal heart sounds.  Pulmonary:     Effort: Pulmonary effort is normal. No respiratory distress.     Breath sounds: Normal breath sounds. No wheezing, rhonchi or rales.  Musculoskeletal:     Cervical back: Neck supple.     Right lower leg: No edema.     Left lower leg: No edema.  Lymphadenopathy:     Cervical: No cervical adenopathy.  Skin:    General: Skin is warm and dry.  Neurological:     Mental Status: She is alert and oriented to person, place, and time. Mental status is at baseline.     Cranial Nerves: No cranial nerve deficit.  Psychiatric:        Mood and Affect: Mood normal.        Behavior: Behavior normal.     No results found for any visits on 03/16/23.      Assessment & Plan:   Problem List Items Addressed This Visit  None Visit Diagnoses     Blurry vision    -  Primary   Chest tightness       Relevant Orders   EKG 12-Lead           Transient Vision Loss Brief episode of unilateral vision loss lasting seconds. No recurrence. No other neurological symptoms. Differential includes transient ischemic attack (TIA) vs ocular etiology, but less likely due to extremely brief episode. -Advised patient to present to ER if symptoms recur for urgent evaluation. - no intervention needed currently  Chest Tightness New onset, intermittent, brief episodes of chest tightness at rest. No associated shortness of breath, no exertional symptoms. No chest tightness during cardiac rehab. -Perform EKG today and compare with previous EKGs - stbale and unchanged. -Advised patient to  notify cardiologist if frequency of chest tightness increases or if it occurs with exercise.  Sleep Disturbance Improved with magnesium supplementation. -Continue magnesium supplementation as it appears to be beneficial.  Atrial Fibrillation Chronic, managed with bisoprolol. -Continue current management.  General Health Maintenance -Continue participation in cardiac rehab program.        No orders of the defined types were placed in this encounter.   Return if symptoms worsen or fail to improve.  Shirlee Latch, MD

## 2023-03-16 NOTE — Progress Notes (Signed)
Daily Session Note  Patient Details  Name: Lauren Lloyd MRN: [id number] Date of Birth: 03/02/1941 Referring Provider:   Flowsheet Row Pulmonary Rehab from 02/13/2023 in [facility] Cardiac and Pulmonary Rehab  Referring Provider Julien Nordmann, MD       Encounter Date: 03/16/2023  Check In:  Session Check In - 03/16/23 0741       Check-In   Supervising physician immediately available to respond to emergencies See telemetry face sheet for immediately available ER MD    Location [facility]-Cardiac & Pulmonary Rehab    Staff Present Cora Collum, RN, BSN, CCRP;Joseph Hood, RCP,RRT,BSRT;Maxon [place] BS, , Exercise Physiologist;Nyshawn Gowdy Katrinka Blazing, RN, [place]    Virtual Visit No    Medication changes reported     No    Fall or balance concerns reported    No    Warm-up and Cool-down Performed on first and last piece of equipment    Resistance Training Performed Yes    VAD Patient? No    PAD/SET Patient? No      Pain Assessment   Currently in Pain? No/denies                Social History   Tobacco Use  Smoking Status Former   Current packs/day: 0.00   Average packs/day: 1 pack/day for 30.0 years (30.0 ttl pk-yrs)   Types: Cigarettes   Start date: 05/17/1959   Quit date: 05/16/1989   Years since quitting: 33.8   Passive exposure: Past  Smokeless Tobacco Never    Goals Met:  Independence with exercise equipment Exercise tolerated well No report of concerns or symptoms today Strength training completed today  Goals Unmet:  Not Applicable  Comments: Pt able to follow exercise prescription today without complaint.  Will continue to monitor for progression.    Dr. Bethann Punches is Medical Director for [facility] Cardiac Rehabilitation.  Dr. Vida Rigger is Medical Director for [facility] Pulmonary Rehabilitation.

## 2023-03-20 ENCOUNTER — Ambulatory Visit: Payer: Self-pay

## 2023-03-20 ENCOUNTER — Encounter: Payer: Self-pay | Admitting: Family Medicine

## 2023-03-20 ENCOUNTER — Ambulatory Visit: Payer: Medicare HMO | Admitting: Family Medicine

## 2023-03-20 ENCOUNTER — Ambulatory Visit: Payer: Medicare HMO | Admitting: Radiation Oncology

## 2023-03-20 VITALS — BP 140/72 | HR 68 | Temp 98.0°F | Ht 67.0 in | Wt 219.0 lb

## 2023-03-20 DIAGNOSIS — R300 Dysuria: Secondary | ICD-10-CM | POA: Diagnosis not present

## 2023-03-20 DIAGNOSIS — M545 Low back pain, unspecified: Secondary | ICD-10-CM | POA: Diagnosis not present

## 2023-03-20 DIAGNOSIS — R1030 Lower abdominal pain, unspecified: Secondary | ICD-10-CM

## 2023-03-20 LAB — POCT URINALYSIS DIPSTICK
Bilirubin, UA: NEGATIVE
Blood, UA: NEGATIVE
Glucose, UA: NEGATIVE
Ketones, UA: NEGATIVE
Leukocytes, UA: NEGATIVE
Nitrite, UA: NEGATIVE
Protein, UA: NEGATIVE
Spec Grav, UA: 1.02 (ref 1.010–1.025)
Urobilinogen, UA: NEGATIVE U/dL — AB
pH, UA: 7.5 (ref 5.0–8.0)

## 2023-03-20 MED ORDER — METAXALONE 800 MG PO TABS: 800.0000 mg | ORAL_TABLET | Freq: Every day | ORAL | 2 refills | Status: DC | PRN

## 2023-03-20 NOTE — Progress Notes (Signed)
Acute Office Visit  Subjective:     Patient ID: Lauren Lloyd, female    DOB: October 17, 1940, 82 y.o.   MRN: [id number]  Chief Complaint  Patient presents with   Urinary Tract Infection    HPI Discussed the use of AI scribe software for clinical note transcription with the patient, who gave verbal consent to proceed.  History of Present Illness   The patient, with a history of atrial fibrillation, presents with lower abdominal pain and backache. The pain started after a recent visit and has been severe enough to cause vomiting and difficulty walking, requiring the use of a walker. The patient describes the pain as cramping and similar to menstrual pain, which radiates to the back. The patient also reports burning sensation before urination and has been using a diaper due to difficulty reaching the bathroom in time, possibly exacerbated by the use of Lasix. The patient also has a history of muscle spasms, which have been managed with Skelaxin in the past.  She is already significantly improving at this time.       ROS      Objective:    BP (!) 140/72   Pulse 68   Temp 98 F (36.7 C)   Ht 5\' 7"  (1.702 m)   Wt 219 lb (99.3 kg)   SpO2 95%   BMI 34.30 kg/m    Physical Exam  Physical Exam   CHEST: Breath sounds normal. ABDOMEN: No tenderness on palpation. MUSCULOSKELETAL: No tenderness along the spine, able to tolerate palpation without discomfort.       Results for orders placed or performed in visit on 03/20/23  POCT urinalysis dipstick  Result Value Ref Range   Color, UA yellow    Clarity, UA clear    Glucose, UA Negative Negative   Bilirubin, UA neg    Ketones, UA neg    Spec Grav, UA 1.020 1.010 - 1.025   Blood, UA neg    pH, UA 7.5 5.0 - 8.0   Protein, UA Negative Negative   Urobilinogen, UA negative (A) 0.2 or 1.0 E.U./dL   Nitrite, UA neg    Leukocytes, UA Negative Negative   Appearance     Odor          Assessment & Plan:   Problem List Items  Addressed This Visit   None Visit Diagnoses     Lower abdominal pain    -  Primary   Relevant Orders   Urine Culture   Acute left-sided low back pain without sciatica       Relevant Medications   metaxalone (SKELAXIN) 800 MG tablet   Dysuria       Relevant Orders   Urine Culture   POCT urinalysis dipstick (Completed)           Musculoskeletal Back Pain   Severe cramping and backache since Friday evening, radiating down the leg, with difficulty walking. Pain improved with hot showers and Salonpas patches. Physical exam shows lower back tenderness, not consistent with renal pain. Suspected musculoskeletal origin, likely exacerbated by recent activity. Imaging not necessary as symptoms are improving and would not alter management.   - Prescribe Skelaxin for muscle spasms   - Provide back exercises   - Advise to monitor symptoms and return if she worsens    Urinary Symptoms   Burning sensation before urination and cramping pain. Normal urinalysis, urine culture pending to rule out infection. Symptoms atypical for UTI and improving. Antibiotics will be considered if  culture indicates infection.   - Send urine culture   - Advise to wait for culture results before starting antibiotics   - Instruct to report any worsening of symptoms    Follow-up   - Call with urine culture results   - Advise to return if symptoms worsen or do not improve.        Meds ordered this encounter  Medications   metaxalone (SKELAXIN) 800 MG tablet    Sig: Take 1 tablet (800 mg total) by mouth daily as needed for muscle spasms.    Dispense:  30 tablet    Refill:  2    Return if symptoms worsen or fail to improve.  Shirlee Latch, MD

## 2023-03-20 NOTE — Telephone Encounter (Signed)
      Chief Complaint: Back and abdominal pain, urine has strong odor Symptoms: Above Frequency: Thursday Pertinent Negatives: Patient denies fever Disposition: [] ED /[] Urgent Care (no appt availability in office) / [x] Appointment(In office/virtual)/ []  [facility]/ [] Home Care/ [] Refused Recommended Disposition /[] [facility]/ []  Follow-up with PCP Additional Notes: Agrees with appointment.  Reason for Disposition  Side (flank) or lower back pain present  Answer Assessment - Initial Assessment Questions 1. SYMPTOM: "What's the main symptom you're concerned about?" (e.g., frequency, incontinence)     Back pain, abdominal pain, odor 2. ONSET: "When did the    start?"     Thursday 3. PAIN: "Is there any pain?" If Yes, ask: "How bad is it?" (Scale: 1-10; mild, moderate, severe)     Yes 4. CAUSE: "What do you think is causing the symptoms?"     UTI 5. OTHER SYMPTOMS: "Do you have any other symptoms?" (e.g., blood in urine, fever, flank pain, pain with urination)     No 6. PREGNANCY: "Is there any chance you are pregnant?" "When was your last menstrual period?"     No  Protocols used: Urinary Symptoms-A-AH

## 2023-03-22 ENCOUNTER — Other Ambulatory Visit: Payer: Self-pay | Admitting: Family Medicine

## 2023-03-22 LAB — URINE CULTURE: Organism ID, Bacteria: NO GROWTH

## 2023-03-24 ENCOUNTER — Encounter: Payer: Self-pay | Admitting: Family Medicine

## 2023-03-24 DIAGNOSIS — Z1231 Encounter for screening mammogram for malignant neoplasm of breast: Secondary | ICD-10-CM

## 2023-03-28 ENCOUNTER — Encounter: Payer: Medicare HMO | Attending: Cardiovascular Disease

## 2023-03-28 DIAGNOSIS — I5032 Chronic diastolic (congestive) heart failure: Secondary | ICD-10-CM | POA: Insufficient documentation

## 2023-03-28 NOTE — Progress Notes (Signed)
Daily Session Note  Patient Details  Name: Lauren Lloyd MRN: [id number] Date of Birth: 1941-02-04 Referring Provider:   Flowsheet Row Pulmonary Rehab from 02/13/2023 in [facility] Cardiac and Pulmonary Rehab  Referring Provider Julien Nordmann, MD       Encounter Date: 03/28/2023  Check In:  Session Check In - 03/28/23 0740       Check-In   Supervising physician immediately available to respond to emergencies See telemetry face sheet for immediately available ER MD    Location [facility]-Cardiac & Pulmonary Rehab    Staff Present Harlene Ramus, RN, BSN;Margaret Best, MS, Exercise Physiologist;Noah Tickle, BS, Exercise Physiologist;Maxon Conetta BS, , Exercise Physiologist    Virtual Visit No    Medication changes reported     No    Fall or balance concerns reported    No    Warm-up and Cool-down Performed on first and last piece of equipment    Resistance Training Performed Yes    VAD Patient? No    PAD/SET Patient? No      Pain Assessment   Currently in Pain? No/denies                Social History   Tobacco Use  Smoking Status Former   Current packs/day: 0.00   Average packs/day: 1 pack/day for 30.0 years (30.0 ttl pk-yrs)   Types: Cigarettes   Start date: 05/17/1959   Quit date: 05/16/1989   Years since quitting: 33.8   Passive exposure: Past  Smokeless Tobacco Never    Goals Met:  Proper associated with RPD/PD & O2 Sat Independence with exercise equipment Using PLB without cueing & demonstrates good technique Exercise tolerated well No report of concerns or symptoms today Strength training completed today  Goals Unmet:  Not Applicable  Comments: Pt able to follow exercise prescription today without complaint.  Will continue to monitor for progression.    Dr. Bethann Punches is Medical Director for [facility] Cardiac Rehabilitation.  Dr. Vida Rigger is Medical Director for [facility] Pulmonary Rehabilitation.

## 2023-03-29 ENCOUNTER — Other Ambulatory Visit: Payer: Self-pay | Admitting: Family Medicine

## 2023-03-29 DIAGNOSIS — Z1231 Encounter for screening mammogram for malignant neoplasm of breast: Secondary | ICD-10-CM

## 2023-04-04 ENCOUNTER — Encounter: Payer: Medicare HMO | Admitting: *Deleted

## 2023-04-04 ENCOUNTER — Ambulatory Visit: Payer: Medicare HMO | Admitting: Pulmonary Disease

## 2023-04-04 ENCOUNTER — Encounter: Payer: Self-pay | Admitting: Pulmonary Disease

## 2023-04-04 VITALS — BP 136/74 | HR 63 | Ht 67.5 in | Wt 219.8 lb

## 2023-04-04 DIAGNOSIS — R0609 Other forms of dyspnea: Secondary | ICD-10-CM

## 2023-04-04 DIAGNOSIS — I5032 Chronic diastolic (congestive) heart failure: Secondary | ICD-10-CM | POA: Diagnosis not present

## 2023-04-04 NOTE — Progress Notes (Signed)
@Patient  ID: Lauren Lloyd, female    DOB: May 28, 1940, 82 y.o.   MRN: [id number]  Chief Complaint  Patient presents with   Follow-up    DOE F/U visit. Pt states SOB is the same and has not changed.    Referring provider: Erasmo Downer, MD  HPI:   82 y.o. woman whom we are seeing for evaluation of dyspnea exertion.  Most recent note from PCP office x 3 reviewed.  Returns for routine follow-up.  Breathing about the same.  Use Stiolto intermittently.  Not sure how much it helped.  Maybe a little bit.  Is doing pulmonary rehab.  Feels like this helps some.  Discussed using Stiolto on a regular basis.  If does not help okay to discontinue.  HPI at initial visit: Patient notes dyspnea for some time now months to years.  Worse on inclines or stairs.  Present on flat surfaces well but not as quickly.  No time of day when things are better or worse.  Improves with rest but otherwise no position to make things better or worse.  No seasonal or environmental factors she can identify to make things better or worse.  No alleviating or otherwise exacerbating factors noted.  She thinks a lot of this started when she went to atrial fibrillation in the past.  This when she first notes in her timeline issues with dyspnea.  She does have a cough.  Brings up light yellow sputum.  Reviewed serial echocardiograms in the past revealed diastolic dysfunction, and chronically elevated estimated PASP similar in readings over the last 3-4 dating back to 2016 at the most remote TTE that I can review results of.  Her most recent chest imaging, cross-sectional imaging, 02/2021 reveals clear lungs with some mild bronchial thickening in the bases on my review and interpretation.  Questionaires / Pulmonary Flowsheets:   ACT:      No data to display          MMRC:     No data to display          Epworth:     12/14/2016    9:00 AM  Results of the Epworth flowsheet  Sitting and reading 1  Watching  TV 1  Sitting, inactive in a public place (e.g. a theatre or a meeting) 0  As a passenger in a car for an hour without a break 1  Lying down to rest in the afternoon when circumstances permit 1  Sitting and talking to someone 0  Sitting quietly after a lunch without alcohol 0  In a car, while stopped for a few minutes in traffic 0  Total score 4    Tests:   FENO:  No results found for: "NITRICOXIDE"  PFT:    Latest Ref Rng & Units 01/09/2023    2:14 PM  PFT Results  FVC-Pre L 1.71   FVC-Predicted Pre % 59   FVC-Post L 1.69   FVC-Predicted Post % 59   Pre FEV1/FVC % % 75   Post FEV1/FCV % % 80   FEV1-Pre L 1.28   FEV1-Predicted Pre % 60   FEV1-Post L 1.35   DLCO uncorrected ml/min/mmHg 15.74   DLCO UNC% % 76   DLCO corrected ml/min/mmHg 15.74   DLCO COR %Predicted % 76   DLVA Predicted % 120   TLC L 4.03   TLC % Predicted % 73   RV % Predicted % 89     WALK:  02/13/2023    4:38 PM  SIX MIN WALK  2 Minute Oxygen Saturation % 92 %  2 Minute HR 107  4 Minute Oxygen Saturation % 91 %  4 Minute HR 111  6 Minute Oxygen Saturation % 93 %  6 Minute HR 83    Imaging: No results found.  Lab Results:  CBC    Component Value Date/Time   WBC 8.5 07/05/2022 1222   RBC 4.75 07/05/2022 1222   HGB 14.6 07/05/2022 1222   HGB 14.5 11/04/2021 1118   HCT 44.4 07/05/2022 1222   HCT 41.7 11/04/2021 1118   PLT 222 07/05/2022 1222   PLT 298 11/04/2021 1118   MCV 93.5 07/05/2022 1222   MCV 90 11/04/2021 1118   MCH 30.7 07/05/2022 1222   MCHC 32.9 07/05/2022 1222   RDW 14.2 07/05/2022 1222   RDW 12.7 11/04/2021 1118   LYMPHSABS 1.3 05/10/2022 1255   LYMPHSABS 2.3 08/03/2020 1012   MONOABS 1.3 (H) 05/10/2022 1255   EOSABS 0.2 05/10/2022 1255   EOSABS 0.2 08/03/2020 1012   BASOSABS 0.1 05/10/2022 1255   BASOSABS 0.1 08/03/2020 1012    BMET    Component Value Date/Time   NA 142 10/28/2022 0801   K 4.5 10/28/2022 0801   CL 105 10/28/2022 0801   CO2 24  10/28/2022 0801   GLUCOSE 111 (H) 10/28/2022 0801   GLUCOSE 90 05/25/2022 1005   BUN 22 10/28/2022 0801   CREATININE 1.06 (H) 10/28/2022 0801   CREATININE 0.78 01/19/2017 1522   CALCIUM 9.7 10/28/2022 0801   GFRNONAA 44 (L) 05/25/2022 1005   GFRNONAA 73 01/19/2017 1522   GFRAA 75 12/09/2019 1411   GFRAA 85 01/19/2017 1522    BNP    Component Value Date/Time   BNP 746.2 (H) 05/12/2022 0453    ProBNP    Component Value Date/Time   PROBNP 2,017 (H) 08/04/2022 1107    Specialty Problems       Pulmonary Problems   OSA (obstructive sleep apnea)   Centrilobular emphysema (HCC)   Dyspnea on exertion    Allergies  Allergen Reactions   Levofloxacin Other (See Comments)    Other reaction(s): Joint Pains    Influenza Vaccine Recombinant Other (See Comments)   Influenza Vaccines Other (See Comments)    Bell's Palsy   Oysters [Shellfish Allergy] Swelling    She states she had eaten them three days in a row and she developed swelling around her eyes.     Immunization History  Administered Date(s) Administered   Hepatitis A 11/25/1999, 09/19/2001   Hepatitis A, Adult 11/25/1999, 09/19/2001   IPV 02/11/2000   PFIZER(Purple Top)SARS-COV-2 Vaccination 06/14/2019, 07/05/2019, 04/15/2020   Pneumococcal Conjugate-13 10/06/2014   Pneumococcal Polysaccharide-23 12/01/2010   Td 02/02/1998   Tdap 09/11/2007, 02/27/2012   Typhoid Inactivated 02/11/2000   Zoster Recombinant(Shingrix) 02/06/2020, 05/29/2020    Past Medical History:  Diagnosis Date   (HFpEF) heart failure with preserved ejection fraction (HCC)    a. 05/2018 Echo: EF 55-60%, no rwma, mild to mod MR. Nl RV fxn. Mod TR. PASP .   Arthritis    knees, Hands   Arthritis of knee    Back pain    Carotid arterial disease (HCC)    a. 03/2019 Carotid U/S: <50% bilat ICA stenoses.   Cholelithiasis    a. 10/2018 noted on CT.   Edema, lower extremity    Fatty liver    GERD (gastroesophageal reflux disease)     History of stress test  a. 06/2018 MV: EF 59%, no ischemia/infarct. Low risk.   Knee pain    Lactose intolerance    Mitral regurgitation    a. 05/2018 Echo: mild to mod MR.   Multinodular goiter    Obesity    OSA (obstructive sleep apnea)    PAF (paroxysmal atrial fibrillation) (HCC)    a.  Diagnosed 12/19; b. 05/2018 s/p DCCV; c. 03/2019 & 05/2019 recurrent AFib-->managed w/ amio load; d. CHADS2VASc = 6 (CHF, HTN, age x 2, vascular disease, female)-->Eliquis & amio 100 qd.   PAH (pulmonary artery hypertension) (HCC)    RSV (acute bronchiolitis due to respiratory syncytial virus) 05/10/2022   Scoliosis    SOB (shortness of breath)    Swallowing difficulty     Tobacco History: Social History   Tobacco Use  Smoking Status Former   Current packs/day: 0.00   Average packs/day: 1 pack/day for 30.0 years (30.0 ttl pk-yrs)   Types: Cigarettes   Start date: 05/17/1959   Quit date: 05/16/1989   Years since quitting: 33.9   Passive exposure: Past  Smokeless Tobacco Never   Counseling given: Not Answered   Continue to not smoke  Outpatient Encounter Medications as of 04/04/2023  Medication Sig   acetaminophen (TYLENOL) 325 MG tablet Take 650 mg by mouth every 6 (six) hours as needed for moderate pain.   allopurinol (ZYLOPRIM) 100 MG tablet TAKE ONE TABLET BY MOUTH EVERY DAY   bisoprolol (ZEBETA) 10 MG tablet Take 1 tablet (10 mg total) by mouth 2 (two) times daily.   calcium carbonate (TUMS EX) 750 MG chewable tablet Chew 2 tablets by mouth daily as needed for heartburn.   Cholecalciferol 25 MCG (1000 UT) tablet Take 1,000 Units by mouth daily.   ELIQUIS 5 MG TABS tablet TAKE ONE TABLET TWICE DAILY   ezetimibe (ZETIA) 10 MG tablet TAKE 1 TABLET BY MOUTH DAILY   furosemide (LASIX) 40 MG tablet TAKE 1 TABLET BY MOUTH DAILY. TAKE AN EXTRA TABLET AS NEEDED AFTER LUNCH FOR ABDOMINAL SWELLING, LEG SWELLING OR SHORTNESS OF BREATH   gabapentin (NEURONTIN) 600 MG tablet TAKE ONE TABLET BY MOUTH  AT BEDTIME   levalbuterol (XOPENEX HFA) 45 MCG/ACT inhaler Inhale 2 puffs into the lungs every 8 (eight) hours as needed for wheezing.   Magnesium 300 MG CAPS Take by mouth.   Menthol, Topical Analgesic, (BIOFREEZE EX) Apply 1 application. topically daily as needed (Neck pain).   metaxalone (SKELAXIN) 800 MG tablet Take 1 tablet (800 mg total) by mouth daily as needed for muscle spasms.   Multiple Vitamin (MULTIVITAMIN) capsule Take 1 capsule by mouth daily.   mupirocin ointment (BACTROBAN) 2 % Place 1 application  into the nose 2 (two) times daily.   naltrexone (DEPADE) 50 MG tablet TAKE 1/2 TABLET BY MOUTH DAILY   potassium chloride (KLOR-CON) 10 MEQ tablet TAKE 1 TABLET BY MOUTH DAILY   Tiotropium Bromide-Olodaterol (STIOLTO RESPIMAT) 2.5-2.5 MCG/ACT AERS Inhale 2 puffs into the lungs daily.   No facility-administered encounter medications on file as of 04/04/2023.     Review of Systems  Review of Systems  N/a Physical Exam  BP 136/74 (BP Location: Right Arm, Cuff Size: Large)   Pulse 63   Ht 5' 7.5" (1.715 m)   Wt 219 lb 12.8 oz (99.7 kg)   SpO2 96%   BMI 33.92 kg/m   Wt Readings from Last 5 Encounters:  04/04/23 219 lb 12.8 oz (99.7 kg)  03/20/23 219 lb (99.3 kg)  03/16/23 222 lb  12.8 oz (101.1 kg)  02/13/23 216 lb 1.6 oz (98 kg)  01/31/23 220 lb (99.8 kg)    BMI Readings from Last 5 Encounters:  04/04/23 33.92 kg/m  03/20/23 34.30 kg/m  03/16/23 34.90 kg/m  02/13/23 34.67 kg/m  01/31/23 33.95 kg/m     Physical Exam General: Sitting in chair, no acute distress Eyes: EOMI, no icterus Neck: Supple, no JVP Pulmonary: Clear, normal work of breathing Cardiovascular: Regular rate and rhythm, no murmur Abdomen: Nondistended, bowel sounds present MSK: No synovitis, no joint effusion Neuro: Normal gait, no weakness Psych: Normal mood, full affect   Assessment & Plan:   Dyspnea on exertion: Suspect this is multifactorial.  Review of multiple pacemaker  checks indicates she is V paced 100% of the time with threshold of 70 bpm.  Do wonder if her heart rate is unable to go above this due to tachycardia bradycardia now permanent bradycardia syndrome and as such she does not have the compensatory increase in cardiac output related to inability to increase heart rate.  She has history of elevated PASP dating back to 2016, likely group 2 in nature given her A-fib, diastolic dysfunction etc.  Overall stable over time but certainly pulmonary hypertension can cause dyspnea.  Suspect element of deconditioning, habitus as well.  She has some mild bronchial wall thickening on CT scan and some cough which could certainly implicate possible asthma.  PFTs without fixed obstruction.  Very low ERV with mild restriction likely to habitus, DLCO within normal limits.  Stiolto does not seem very helpful although not using regularly.  Encouraged ongoing use.  Encouraged ongoing pulmonary rehabilitation efforts.  Asthma versus chronic bronchitis: Clinical diagnosis based on bronchial wall thickening as well as cigarette smoking history.  Encouraged once again to try Stiolto every day for several weeks to see if it helps.  If not beneficial okay to discontinue.   Return in about 3 months (around 07/05/2023) for f/u Dr. Judeth Horn.   Karren Burly, MD 04/04/2023

## 2023-04-04 NOTE — Patient Instructions (Addendum)
Nice to see you   Try stiolto 2 puffs once a day every day for next 6 weeks  Send me a message in about 4 weeks and let me know if it is helping or not  Return to clinic in 3 months or sooner as needed

## 2023-04-04 NOTE — Progress Notes (Signed)
Daily Session Note  Patient Details  Name: Lauren Lloyd MRN: [id number] Date of Birth: 29-Feb-1941 Referring Provider:   Flowsheet Row Pulmonary Rehab from 02/13/2023 in [facility]  Referring Provider Julien Nordmann, MD       Encounter Date: 04/04/2023  Check In:  Session Check In - 04/04/23 0819       Check-In   Supervising physician immediately available to respond to emergencies See telemetry face sheet for immediately available ER MD    Location [facility]-Cardiac & Pulmonary Rehab    Staff Present Cora Collum, RN, BSN, CCRP;Noah Tickle, BS, Exercise Physiologist;Maxon Conetta BS, , Exercise Physiologist    Virtual Visit No    Medication changes reported     No    Fall or balance concerns reported    No    Warm-up and Cool-down Performed on first and last piece of equipment    Resistance Training Performed Yes    VAD Patient? No    PAD/SET Patient? No      Pain Assessment   Currently in Pain? No/denies                Social History   Tobacco Use  Smoking Status Former   Current packs/day: 0.00   Average packs/day: 1 pack/day for 30.0 years (30.0 ttl pk-yrs)   Types: Cigarettes   Start date: 05/17/1959   Quit date: 05/16/1989   Years since quitting: 33.9   Passive exposure: Past  Smokeless Tobacco Never    Goals Met:  Proper associated with RPD/PD & O2 Sat [place] with exercise equipment Exercise tolerated well No report of concerns or symptoms today  Goals Unmet:  Not Applicable  Comments: Pt able to follow exercise prescription today without complaint.  Will continue to monitor for progression.    Dr. Bethann Punches is Medical Director for [facility].  Dr. Vida Rigger is Medical Director for [facility] Pulmonary Rehabilitation.

## 2023-04-05 ENCOUNTER — Encounter: Payer: Self-pay | Admitting: *Deleted

## 2023-04-05 DIAGNOSIS — I5032 Chronic diastolic (congestive) heart failure: Secondary | ICD-10-CM

## 2023-04-05 NOTE — Progress Notes (Signed)
Pulmonary Individual Treatment Plan  Patient Details  Name: Lauren Lloyd MRN: [id number] Date of Birth: Oct 12, 1940 Referring Provider:   Flowsheet Row Pulmonary Rehab from 02/13/2023 in [facility] Cardiac and Pulmonary Rehab  Referring Provider Julien Nordmann, MD       Initial Encounter Date:  Flowsheet Row Pulmonary Rehab from 02/13/2023 in [facility] Cardiac and Pulmonary Rehab  Date 02/13/23       Visit Diagnosis: Heart failure, diastolic, chronic (HCC)  Patient's Home Medications on Admission:  Current Outpatient Medications:    acetaminophen (TYLENOL) 325 MG tablet, Take 650 mg by mouth every 6 (six) hours as needed for moderate pain., Disp: , Rfl:    allopurinol (ZYLOPRIM) 100 MG tablet, TAKE ONE TABLET BY MOUTH EVERY DAY, Disp: 90 tablet, Rfl: 1   bisoprolol (ZEBETA) 10 MG tablet, Take 1 tablet (10 mg total) by mouth 2 (two) times daily., Disp: 180 tablet, Rfl: 3   calcium carbonate (TUMS EX) 750 MG chewable tablet, Chew 2 tablets by mouth daily as needed for heartburn., Disp: , Rfl:    Cholecalciferol 25 MCG (1000 UT) tablet, Take 1,000 Units by mouth daily., Disp: , Rfl:    ELIQUIS 5 MG TABS tablet, TAKE ONE TABLET TWICE DAILY, Disp: 180 tablet, Rfl: 1   ezetimibe (ZETIA) 10 MG tablet, TAKE 1 TABLET BY MOUTH DAILY, Disp: 90 tablet, Rfl: 3   furosemide (LASIX) 40 MG tablet, TAKE 1 TABLET BY MOUTH DAILY. TAKE AN EXTRA TABLET AS NEEDED AFTER LUNCH FOR ABDOMINAL SWELLING, LEG SWELLING OR SHORTNESS OF BREATH, Disp: 180 tablet, Rfl: 1   gabapentin (NEURONTIN) 600 MG tablet, TAKE ONE TABLET BY MOUTH AT BEDTIME, Disp: 90 tablet, Rfl: 0   levalbuterol (XOPENEX HFA) 45 MCG/ACT inhaler, Inhale 2 puffs into the lungs every 8 (eight) hours as needed for wheezing., Disp: 1 each, Rfl: 2   Magnesium 300 MG CAPS, Take by mouth., Disp: , Rfl:    Menthol, Topical Analgesic, (BIOFREEZE EX), Apply 1 application. topically daily as needed (Neck pain)., Disp: , Rfl:    metaxalone (SKELAXIN) 800 MG tablet,  Take 1 tablet (800 mg total) by mouth daily as needed for muscle spasms., Disp: 30 tablet, Rfl: 2   Multiple Vitamin (MULTIVITAMIN) capsule, Take 1 capsule by mouth daily., Disp: , Rfl:    mupirocin ointment (BACTROBAN) 2 %, Place 1 application  into the nose 2 (two) times daily., Disp: 22 g, Rfl: 0   naltrexone (DEPADE) 50 MG tablet, TAKE 1/2 TABLET BY MOUTH DAILY, Disp: 15 tablet, Rfl: 2   potassium chloride (KLOR-CON) 10 MEQ tablet, TAKE 1 TABLET BY MOUTH DAILY, Disp: 30 tablet, Rfl: 5   Tiotropium Bromide-Olodaterol (STIOLTO RESPIMAT) 2.5-2.5 MCG/ACT AERS, Inhale 2 puffs into the lungs daily., Disp: 4 g, Rfl: 0  Past Medical History: Past Medical History:  Diagnosis Date   (HFpEF) heart failure with preserved ejection fraction (HCC)    a. 05/2018 Echo: EF 55-60%, no rwma, mild to mod MR. Nl RV fxn. Mod TR. PASP .   Arthritis    knees, Hands   Arthritis of knee    Back pain    Carotid arterial disease (HCC)    a. 03/2019 Carotid U/S: <50% bilat ICA stenoses.   Cholelithiasis    a. 10/2018 noted on CT.   Edema, lower extremity    Fatty liver    GERD (gastroesophageal reflux disease)    History of stress test    a. 06/2018 MV: EF 59%, no ischemia/infarct. Low risk.   Knee pain  Lactose intolerance    Mitral regurgitation    a. 05/2018 Echo: mild to mod MR.   Multinodular goiter    Obesity    OSA (obstructive sleep apnea)    PAF (paroxysmal atrial fibrillation) (HCC)    a.  Diagnosed 12/19; b. 05/2018 s/p DCCV; c. 03/2019 & 05/2019 recurrent AFib-->managed w/ amio load; d. CHADS2VASc = 6 (CHF, HTN, age x 2, vascular disease, female)-->Eliquis & amio 100 qd.   PAH (pulmonary artery hypertension) (HCC)    RSV (acute bronchiolitis due to respiratory syncytial virus) 05/10/2022   Scoliosis    SOB (shortness of breath)    Swallowing difficulty     Tobacco Use: Social History   Tobacco Use  Smoking Status Former   Current packs/day: 0.00   Average packs/day: 1 pack/day for  30.0 years (30.0 ttl pk-yrs)   Types: Cigarettes   Start date: 05/17/1959   Quit date: 05/16/1989   Years since quitting: 33.9   Passive exposure: Past  Smokeless Tobacco Never    Labs: Review Flowsheet  More data exists      Latest Ref Rng & Units 12/09/2019 08/03/2020 08/16/2021 03/17/2022 10/28/2022  Labs for ITP Cardiac and Pulmonary Rehab  Cholestrol 100 - 199 mg/dL 161  096  CANCELED  045  176   LDL (calc) 0 - 99 mg/dL 409  811  - 93  914   HDL-C >39 mg/dL 73  65  CANCELED  52  54   Trlycerides 0 - 149 mg/dL 782  956  CANCELED  94  126   Hemoglobin A1c 4.8 - 5.6 % 5.5  - - 6.0  6.0     Details             Pulmonary Assessment Scores:  Pulmonary Assessment Scores     Row Name 02/13/23 1645         ADL UCSD   ADL Phase Entry     SOB Score total 57     Rest 0     Walk 3     Stairs 4     Bath 1     Dress 1     Shop 1       CAT Score   CAT Score 23       mMRC Score   mMRC Score 2              UCSD: Self-administered rating of dyspnea associated with activities of daily living (ADLs) 6-point scale (0 = "not at all" to 5 = "maximal or unable to do because of breathlessness")  Scoring Scores range from 0 to 120.  Minimally important difference is 5 units  CAT: CAT can identify the health impairment of COPD patients and is better correlated with disease progression.  CAT has a scoring range of zero to 40. The CAT score is classified into four groups of low (less than 10), medium (10 - 20), high (21-30) and very high (31-40) based on the impact level of disease on health status. A CAT score over 10 suggests significant symptoms.  A worsening CAT score could be explained by an exacerbation, poor medication adherence, poor inhaler technique, or progression of COPD or comorbid conditions.  CAT MCID is 2 points  mMRC: mMRC (Modified Medical Research Council) Dyspnea Scale is used to assess the degree of baseline functional disability in patients of respiratory  disease due to dyspnea. No minimal important difference is established. A decrease in score of 1 point or greater is  considered a positive change.   Pulmonary Function Assessment:  Pulmonary Function Assessment - 01/25/23 1416       Breath   Shortness of Breath Yes;Limiting activity             Exercise Target Goals: Exercise Program Goal: Individual exercise prescription set using results from initial 6 min walk test and THRR while considering  patient's activity barriers and safety.   Exercise Prescription Goal: Initial exercise prescription builds to 30-45 minutes a day of aerobic activity, 2-3 days per week.  Home exercise guidelines will be given to patient during program as part of exercise prescription that the participant will acknowledge.  Education: Aerobic Exercise: - Group verbal and visual presentation on the components of exercise prescription. Introduces F.I.T.T principle from ACSM for exercise prescriptions.  Reviews F.I.T.T. principles of aerobic exercise including progression. Written material given at graduation.   Education: Resistance Exercise: - Group verbal and visual presentation on the components of exercise prescription. Introduces F.I.T.T principle from ACSM for exercise prescriptions  Reviews F.I.T.T. principles of resistance exercise including progression. Written material given at graduation.    Education: Exercise & Equipment Safety: - Individual verbal instruction and demonstration of equipment use and safety with use of the equipment. Flowsheet Row Pulmonary Rehab from 02/16/2023 in [facility] Cardiac and Pulmonary Rehab  Date 02/13/23  Educator MB  Instruction Review Code 1- Verbalizes Understanding       Education: Exercise Physiology & General Exercise Guidelines: - Group verbal and written instruction with models to review the exercise physiology of the cardiovascular system and associated critical values. Provides general exercise guidelines  with specific guidelines to those with heart or lung disease.  Flowsheet Row Pulmonary Rehab from 02/16/2023 in [facility] Cardiac and Pulmonary Rehab  Education need identified 02/13/23       Education: Flexibility, Balance, Mind/Body Relaxation: - Group verbal and visual presentation with interactive activity on the components of exercise prescription. Introduces F.I.T.T principle from ACSM for exercise prescriptions. Reviews F.I.T.T. principles of flexibility and balance exercise training including progression. Also discusses the mind body connection.  Reviews various relaxation techniques to help reduce and manage stress (i.e. Deep breathing, progressive muscle relaxation, and visualization). Balance handout provided to take home. Written material given at graduation.   Activity Barriers & Risk Stratification:  Activity Barriers & Cardiac Risk Stratification - 02/13/23 1640       Activity Barriers & Cardiac Risk Stratification   Activity Barriers Arthritis;Back Problems;Shortness of Breath             6 Minute Walk:  6 Minute Walk     Row Name 02/13/23 1638         6 Minute Walk   Phase Initial     Distance 945 feet     Walk Time 6 minutes     # of Rest Breaks 0     MPH 1.79     METS 1.54     RPE 15     Perceived Dyspnea  3     VO2 Peak 5.41     Symptoms Yes (comment)     Comments back pain     Resting HR 70 bpm     Resting BP 130/74     Resting Oxygen Saturation  92 %     Exercise Oxygen Saturation  during 6 min walk 91 %     Max Ex. HR 111 bpm     Max Ex. BP 166/72     2 Minute Post BP  150/74       Interval HR   1 Minute HR 103     2 Minute HR 107     3 Minute HR 109     4 Minute HR 111     5 Minute HR 82     6 Minute HR 83     2 Minute Post HR 70     Interval Heart Rate? Yes       Interval Oxygen   Interval Oxygen? Yes     Baseline Oxygen Saturation % 92 %     1 Minute Oxygen Saturation % 91 %     1 Minute Liters of Oxygen 0 L     2 Minute Oxygen  Saturation % 92 %     2 Minute Liters of Oxygen 0 L     3 Minute Oxygen Saturation % 91 %     3 Minute Liters of Oxygen 0 L     4 Minute Oxygen Saturation % 91 %     4 Minute Liters of Oxygen 0 L     5 Minute Oxygen Saturation % 95 %     5 Minute Liters of Oxygen 0 L     6 Minute Oxygen Saturation % 93 %     6 Minute Liters of Oxygen 0 L     2 Minute Post Oxygen Saturation % 96 %     2 Minute Post Liters of Oxygen 0 L             Oxygen Initial Assessment:  Oxygen Initial Assessment - 01/25/23 1415       Home Oxygen   Home Oxygen Device None    Sleep Oxygen Prescription CPAP    Liters per minute --   off and on   Home Exercise Oxygen Prescription None    Home Resting Oxygen Prescription None    Compliance with Home Oxygen Use Yes      Initial 6 min Walk   Oxygen Used None      Program Oxygen Prescription   Program Oxygen Prescription None      Intervention   Short Term Goals To learn and exhibit compliance with exercise, home and travel O2 prescription;To learn and understand importance of monitoring SPO2 with pulse oximeter and demonstrate accurate use of the pulse oximeter.;To learn and understand importance of maintaining oxygen saturations>88%;To learn and demonstrate proper pursed lip breathing techniques or other breathing techniques. ;To learn and demonstrate proper use of respiratory medications    Long  Term Goals Exhibits compliance with exercise, home  and travel O2 prescription;Verbalizes importance of monitoring SPO2 with pulse oximeter and return demonstration;Maintenance of O2 saturations>88%;Exhibits proper breathing techniques, such as pursed lip breathing or other method taught during program session;Compliance with respiratory medication;Demonstrates proper use of MDI's             Oxygen Re-Evaluation:  Oxygen Re-Evaluation     Row Name 02/16/23 0813 03/02/23 0802 04/04/23 0850         Program Oxygen Prescription   Program Oxygen Prescription  -- None None       Home Oxygen   Home Oxygen Device -- None None     Sleep Oxygen Prescription -- CPAP CPAP     Liters per minute -- -- --  off and on     Home Exercise Oxygen Prescription -- None None     Home Resting Oxygen Prescription -- None None     Compliance with Home Oxygen  Use -- No No       Goals/Expected Outcomes   Short Term Goals -- To learn and demonstrate proper pursed lip breathing techniques or other breathing techniques.  To learn and demonstrate proper pursed lip breathing techniques or other breathing techniques.      Long  Term Goals -- Exhibits proper breathing techniques, such as pursed lip breathing or other method taught during program session Exhibits proper breathing techniques, such as pursed lip breathing or other method taught during program session     Comments Reviewed PLB technique with pt.  Talked about how it works and it's importance in maintaining their exercise saturations. Informed patient how to perform the Pursed Lipped breathing technique. Told patient to Inhale through the nose and out the mouth with pursed lips to keep their airways open, help oxygenate them better, practice when at rest or doing strenuous activity. Patient Verbalizes understanding of technique and will work on and be reiterated during [facility]. Reviewed pursed lipped breathing with patient. She verbalized understanding and will continue to work on it during rest and during exercise.     Goals/Expected Outcomes Short: Become more profiecient at using PLB. Long: Become independent at using PLB. Short: use PLB with exertion. Long: use PLB on exertion proficiently and independently. Short: use pursed lipped breathing with rest and exercise. Long: use pursed lipped breathing independently with exercise.              Oxygen Discharge (Final Oxygen Re-Evaluation):  Oxygen Re-Evaluation - 04/04/23 0850       Program Oxygen Prescription   Program Oxygen Prescription None      Home  Oxygen   Home Oxygen Device None    Sleep Oxygen Prescription CPAP    Liters per minute --   off and on   Home Exercise Oxygen Prescription None    Home Resting Oxygen Prescription None    Compliance with Home Oxygen Use No      Goals/Expected Outcomes   Short Term Goals To learn and demonstrate proper pursed lip breathing techniques or other breathing techniques.     Long  Term Goals Exhibits proper breathing techniques, such as pursed lip breathing or other method taught during program session    Comments Reviewed pursed lipped breathing with patient. She verbalized understanding and will continue to work on it during rest and during exercise.    Goals/Expected Outcomes Short: use pursed lipped breathing with rest and exercise. Long: use pursed lipped breathing independently with exercise.             Initial Exercise Prescription:  Initial Exercise Prescription - 02/13/23 1600       Date of Initial Exercise RX and Referring Provider   Date 02/13/23    Referring Provider Julien Nordmann, MD      Oxygen   Maintain Oxygen Saturation 88% or higher      Recumbant Bike   Level 1    RPM 50    Watts 14    Minutes 15    METs 1.54      NuStep   Level 1    SPM 80    Minutes 15    METs 1.54      Biostep-RELP   Level 1    SPM 50    Minutes 15    METs 1.54      Track   Laps 8    Minutes 15    METs 1.44      Prescription Details  Frequency (times per week) 2    Duration Progress to 30 minutes of continuous aerobic without signs/symptoms of physical distress      Intensity   THRR 40-80% of Max Heartrate 96-123    Ratings of Perceived Exertion 11-13    Perceived Dyspnea 0-4      Progression   Progression Continue to progress workloads to maintain intensity without signs/symptoms of physical distress.      Resistance Training   Training Prescription Yes    Weight 5lb    Reps 10-15             Perform Capillary Blood Glucose checks as needed.  Exercise  Prescription Changes:   Exercise Prescription Changes     Row Name 02/13/23 1600 02/22/23 1100 03/09/23 0800 03/22/23 1600       Response to Exercise   Blood Pressure (Admit) 130/74 146/74 164/82 140/70    Blood Pressure (Exercise) 166/72 116/70 148/62 134/60    Blood Pressure (Exit) 130/74 130/76 120/58 128/56    Heart Rate (Admit) 70 bpm 70 bpm 72 bpm 75 bpm    Heart Rate (Exercise) 111 bpm 81 bpm 123 bpm 128 bpm    Heart Rate (Exit) 70 bpm 70 bpm 72 bpm 70 bpm    Oxygen Saturation (Admit) 92 % 96 % 99 % 99 %    Oxygen Saturation (Exercise) 91 % 92 % 92 % 92 %    Oxygen Saturation (Exit) 94 % 95 % 95 % 97 %    Rating of Perceived Exertion (Exercise) 15 14 13 14     Perceived Dyspnea (Exercise) 3 3 2 1     Symptoms back pain none none none    Comments results -- -- --    Duration Progress to 30 minutes of  aerobic without signs/symptoms of physical distress Progress to 30 minutes of  aerobic without signs/symptoms of physical distress Progress to 30 minutes of  aerobic without signs/symptoms of physical distress Progress to 30 minutes of  aerobic without signs/symptoms of physical distress    Intensity THRR New THRR unchanged THRR unchanged THRR unchanged      Progression   Progression Continue to progress workloads to maintain intensity without signs/symptoms of physical distress. Continue to progress workloads to maintain intensity without signs/symptoms of physical distress. Continue to progress workloads to maintain intensity without signs/symptoms of physical distress. Continue to progress workloads to maintain intensity without signs/symptoms of physical distress.    Average METs 1.54 2.05 1.97 2.07      Resistance Training   Training Prescription -- Yes Yes Yes    Weight -- 5lb 5lb 5lb    Reps -- 10-15 10-15 10-15      Interval Training   Interval Training -- No No No      Treadmill   MPH -- -- -- 1.4    Grade -- -- -- 0    Minutes -- -- -- 15    METs -- -- -- 2.07       NuStep   Level -- -- 2 2    Minutes -- -- 15 15    METs -- -- 2.1 2.6      Biostep-RELP   Level -- 1 1 1     Minutes -- 15 15 15     METs -- 2 2 2       Track   Laps -- 20  Hallway 15  Hallway 17  hallway    Minutes -- 15 15 15     METs --  2.09 1.82 1.92      Oxygen   Maintain Oxygen Saturation -- 88% or higher 88% or higher 88% or higher             Exercise Comments:   Exercise Comments     Row Name 02/16/23 0811           Exercise Comments First full day of exercise!  Patient was oriented to gym and equipment including functions, settings, policies, and procedures.  Patient's individual exercise prescription and treatment plan were reviewed.  All starting workloads were established based on the results of the 6 minute walk test done at initial orientation visit.  The plan for exercise progression was also introduced and progression will be customized based on patient's performance and goals.                Exercise Goals and Review:   Exercise Goals     Row Name 02/13/23 1644             Exercise Goals   Increase Physical Activity Yes       Intervention Provide advice, education, support and counseling about physical activity/exercise needs.;Develop an individualized exercise prescription for aerobic and resistive training based on initial evaluation findings, risk stratification, comorbidities and participant's personal goals.       Expected Outcomes Short Term: Attend rehab on a regular basis to increase amount of physical activity.;Long Term: Exercising regularly at least 3-5 days a week.;Long Term: Add in home exercise to make exercise part of routine and to increase amount of physical activity.       Increase Strength and Stamina Yes       Intervention Provide advice, education, support and counseling about physical activity/exercise needs.;Develop an individualized exercise prescription for aerobic and resistive training based on initial evaluation  findings, risk stratification, comorbidities and participant's personal goals.       Expected Outcomes Short Term: Increase workloads from initial exercise prescription for resistance, speed, and METs.;Short Term: Perform resistance training exercises routinely during rehab and add in resistance training at home;Long Term: Improve cardiorespiratory fitness, muscular endurance and strength as measured by increased METs and functional capacity ( )       Able to understand and use rate of perceived exertion (RPE) scale Yes       Intervention Provide education and explanation on how to use RPE scale       Expected Outcomes Short Term: Able to use RPE daily in rehab to express subjective intensity level;Long Term:  Able to use RPE to guide intensity level when exercising independently       Able to understand and use Dyspnea scale Yes       Intervention Provide education and explanation on how to use Dyspnea scale       Expected Outcomes Short Term: Able to use Dyspnea scale daily in rehab to express subjective sense of shortness of breath during exertion;Long Term: Able to use Dyspnea scale to guide intensity level when exercising independently       Knowledge and understanding of Target Heart Rate Range (THRR) Yes       Intervention Provide education and explanation of THRR including how the numbers were predicted and where they are located for reference       Expected Outcomes Short Term: Able to state/look up THRR;Long Term: Able to use THRR to govern intensity when exercising independently;Short Term: Able to use daily as guideline for intensity in rehab  Able to check pulse independently Yes       Intervention Provide education and demonstration on how to check pulse in carotid and radial arteries.;Review the importance of being able to check your own pulse for safety during independent exercise       Expected Outcomes Short Term: Able to explain why pulse checking is important during  independent exercise;Long Term: Able to check pulse independently and accurately       Understanding of Exercise Prescription Yes       Intervention Provide education, explanation, and written materials on patient's individual exercise prescription       Expected Outcomes Short Term: Able to explain program exercise prescription;Long Term: Able to explain home exercise prescription to exercise independently                Exercise Goals Re-Evaluation :  Exercise Goals Re-Evaluation     Row Name 02/16/23 0811 02/22/23 1122 03/09/23 0855 03/22/23 1617       Exercise Goal Re-Evaluation   Exercise Goals Review Able to understand and use rate of perceived exertion (RPE) scale;Able to understand and use Dyspnea scale;Knowledge and understanding of Target Heart Rate Range (THRR);Understanding of Exercise Prescription Increase Physical Activity;Increase Strength and Stamina;Understanding of Exercise Prescription Increase Physical Activity;Increase Strength and Stamina;Understanding of Exercise Prescription Increase Physical Activity;Increase Strength and Stamina;Understanding of Exercise Prescription    Comments Reviewed RPE and dyspnea scale, THR and program prescription with pt today.  Pt voiced understanding and was given a copy of goals to take home. Keondria is off to a good start in the program. She has only attended one session during this review. She was able to increase her track laps from 8 to 20 in 15 minutes, and used the biostep at level 1. We will continue to monitor her progress in the program. Cheyene is doing well in rehab. She recently was able to increase her level on the T4 nustep from level 1 to 2. She has maintained her intensity on the biostep at level 1. We will continue to monitor her progress in the program. Sharronda continues to do well in rehab. She was able to walk 17 laps in the hallway in 15 minutes. She also used the treadmill at a workload of 1.4 mph and 0% grade. We will continue  to monitor her progress in the program.    Expected Outcomes Short: Use RPE daily to regulate intensity. Long: Follow program prescription in THR. Short: Continue to follow current exercise prescription, and progressively increase workloads. Long: Continue exercise to improve strength and stamina. Short: Continue to follow current exercise prescription, and progressively increase workloads. Long: Continue exercise to improve strength and stamina. Short: Continue to follow current exercise prescription, and progressively increase workloads. Long: Continue exercise to improve strength and stamina.             Discharge Exercise Prescription (Final Exercise Prescription Changes):  Exercise Prescription Changes - 03/22/23 1600       Response to Exercise   Blood Pressure (Admit) 140/70    Blood Pressure (Exercise) 134/60    Blood Pressure (Exit) 128/56    Heart Rate (Admit) 75 bpm    Heart Rate (Exercise) 128 bpm    Heart Rate (Exit) 70 bpm    Oxygen Saturation (Admit) 99 %    Oxygen Saturation (Exercise) 92 %    Oxygen Saturation (Exit) 97 %    Rating of Perceived Exertion (Exercise) 14    Perceived Dyspnea (Exercise) 1  Symptoms none    Duration Progress to 30 minutes of  aerobic without signs/symptoms of physical distress    Intensity THRR unchanged      Progression   Progression Continue to progress workloads to maintain intensity without signs/symptoms of physical distress.    Average METs 2.07      Resistance Training   Training Prescription Yes    Weight 5lb    Reps 10-15      Interval Training   Interval Training No      Treadmill   MPH 1.4    Grade 0    Minutes 15    METs 2.07      NuStep   Level 2    Minutes 15    METs 2.6      Biostep-RELP   Level 1    Minutes 15    METs 2      Track   Laps 17   hallway   Minutes 15    METs 1.92      Oxygen   Maintain Oxygen Saturation 88% or higher             Nutrition:  Target Goals: Understanding of  nutrition guidelines, daily intake of sodium 1500mg , cholesterol 200mg , calories 30% from fat and 7% or less from saturated fats, daily to have 5 or more servings of fruits and vegetables.  Education: All About Nutrition: -Group instruction provided by verbal, written material, interactive activities, discussions, models, and posters to present general guidelines for heart healthy nutrition including fat, fiber, MyPlate, the role of sodium in heart healthy nutrition, utilization of the nutrition label, and utilization of this knowledge for meal planning. Follow up email sent as well. Written material given at graduation.   Biometrics:  Pre Biometrics - 02/13/23 1644       Pre Biometrics   Height 5' 6.2" (1.681 m)    Weight 216 lb 1.6 oz (98 kg)    Waist Circumference 45.5 inches    Hip Circumference 50 inches    Waist to Hip Ratio 0.91 %    BMI (Calculated) 34.69    Single Leg Stand 1.5 seconds              Nutrition Therapy Plan and Nutrition Goals:  Nutrition Therapy & Goals - 02/16/23 0947       Nutrition Therapy   Diet Cardiac, low na    Protein (specify units) 90    Fiber 25 grams    Whole Grain Foods 3 servings    Saturated Fats 15 max. grams    Fruits and Vegetables 5 servings/day    Sodium 2 grams      Personal Nutrition Goals   Nutrition Goal Look into healthy snacks to help be consistent in not missing meals    Personal Goal #2 Eat a protein at every meals    Comments Patient drinking 24oz of water daily, has been drinking this much to avoid fluid on her chest, making it hard to breath. Spoke to her about watching her sodium intake as well. She reads labels and is knowledgeable of basics on nutrition. Reviewed Mediterranean diet handout, educated on types of fats, sources, and how to read them on label. Encouraged more veggies at larger meals or when eating poor food choices like fried chicken. She reports that sometimes she gets busy and misses meals.  Recommended several smaller meals and snacks with quick grab and go friendly foods to try and be more consistent. Overall, she is  doing well and is knowledgeable of how to build balanced plates      Intervention Plan   Intervention Prescribe, educate and counsel regarding individualized specific dietary modifications aiming towards targeted core components such as weight, hypertension, lipid management, diabetes, heart failure and other comorbidities.;Nutrition handout(s) given to patient.    Expected Outcomes Short Term Goal: Understand basic principles of dietary content, such as calories, fat, sodium, cholesterol and nutrients.;Short Term Goal: A plan has been developed with personal nutrition goals set during dietitian appointment.;Long Term Goal: Adherence to prescribed nutrition plan.             Nutrition Assessments:  MEDIFICTS Score Key: >=70 Need to make dietary changes  40-70 Heart Healthy Diet <= 40 Therapeutic Level Cholesterol Diet  Flowsheet Row Pulmonary Rehab from 02/13/2023 in [facility]  Picture Your Plate Total Score on Admission 57      Picture Your Plate Scores: <16 Unhealthy dietary pattern with much room for improvement. 41-50 Dietary pattern unlikely to meet recommendations for good health and room for improvement. 51-60 More healthful dietary pattern, with some room for improvement.  >60 Healthy dietary pattern, although there may be some specific behaviors that could be improved.   Nutrition Goals Re-Evaluation:  Nutrition Goals Re-Evaluation     Row Name 03/02/23 0804 04/04/23 0900           Goals   Current Weight 218 lb (98.9 kg) --      Comment Patient was informed on why it is important to maintain a balanced diet when dealing with Respiratory issues. Explained that it takes a lot of energy to breath and when they are short of breath often they will need to have a good diet to help keep up with the calories they are expending  for breathing. Patient is attempting nutrition goals from RD of eating a protein in every meal and choosing healthy snacks. She is not consistent with it.      Expected Outcome Short: Choose and plan snacks accordingly to patients caloric intake to improve breathing. Long: Maintain a diet independently that meets their caloric intake to aid in daily shortness of breath. Short: Continue to choose and plan healthy snacks andhave a protein every meal. Long: mantain a diet independently that meets caloric intake and includes these healthy snacks and protein.               Nutrition Goals Discharge (Final Nutrition Goals Re-Evaluation):  Nutrition Goals Re-Evaluation - 04/04/23 0900       Goals   Comment Patient is attempting nutrition goals from RD of eating a protein in every meal and choosing healthy snacks. She is not consistent with it.    Expected Outcome Short: Continue to choose and plan healthy snacks andhave a protein every meal. Long: mantain a diet independently that meets caloric intake and includes these healthy snacks and protein.             Psychosocial: Target Goals: Acknowledge presence or absence of significant depression and/or stress, maximize coping skills, provide positive support system. Participant is able to verbalize types and ability to use techniques and skills needed for reducing stress and depression.   Education: Stress, Anxiety, and Depression - Group verbal and visual presentation to define topics covered.  Reviews how body is impacted by stress, anxiety, and depression.  Also discusses healthy ways to reduce stress and to treat/manage anxiety and depression.  Written material given at graduation.   Education: Sleep  Hygiene -Provides group verbal and written instruction about how sleep can affect your health.  Define sleep hygiene, discuss sleep cycles and impact of sleep habits. Review good sleep hygiene tips.    Initial Review & Psychosocial  Screening:  Initial Psych Review & Screening - 01/25/23 1419       Initial Review   Current issues with None Identified      Family Dynamics   Good Support System? Yes    Comments She can look to her two sons and a daughter that lives here. She lives alone but is able to call her family if she needs help.      Barriers   Psychosocial barriers to participate in program The patient should benefit from training in stress management and relaxation.;There are no identifiable barriers or psychosocial needs.      Screening Interventions   Interventions Encouraged to exercise;To provide support and resources with identified psychosocial needs;Provide feedback about the scores to participant    Expected Outcomes Short Term goal: Utilizing psychosocial counselor, staff and physician to assist with identification of specific Stressors or current issues interfering with healing process. Setting desired goal for each stressor or current issue identified.;Long Term Goal: Stressors or current issues are controlled or eliminated.;Short Term goal: Identification and review with participant of any Quality of Life or Depression concerns found by scoring the questionnaire.;Long Term goal: The participant improves quality of Life and PHQ9 Scores as seen by post scores and/or verbalization of changes             Quality of Life Scores:  Scores of 19 and below usually indicate a poorer quality of life in these areas.  A difference of  2-3 points is a clinically meaningful difference.  A difference of 2-3 points in the total score of the Quality of Life Index has been associated with significant improvement in overall quality of life, self-image, physical symptoms, and general health in studies assessing change in quality of life.  PHQ-9: Review Flowsheet  More data exists      03/16/2023 02/16/2023 01/11/2023 10/03/2022 08/11/2022  Depression screen PHQ 2/9  Decreased Interest 0 0 0 0 0  Down, Depressed,  Hopeless 0 0 0 0 0  PHQ - 2 Score 0 0 0 0 0  Altered sleeping 1 0 - 1 1  Tired, decreased energy 1 1 - 2 2  Change in appetite 1 1 - 2 2  Feeling bad or failure about yourself  0 0 - 0 0  Trouble concentrating 0 0 - 0 0  Moving slowly or fidgety/restless 0 0 - 0 0  Suicidal thoughts 0 0 - 0 0  PHQ-9 Score 3 2 - 5 5  Difficult doing work/chores Not difficult at all Not difficult at all - Not difficult at all Not difficult at all    Details           Interpretation of Total Score  Total Score Depression Severity:  1-4 = Minimal depression, 5-9 = Mild depression, 10-14 = Moderate depression, 15-19 = Moderately severe depression, 20-27 = Severe depression   Psychosocial Evaluation and Intervention:  Psychosocial Evaluation - 01/25/23 1420       Psychosocial Evaluation & Interventions   Interventions Relaxation education;Stress management education;Encouraged to exercise with the program and follow exercise prescription    Comments She can look to her two sons and a daughter that lives here. She lives alone but is able to call her family if she needs help.  Expected Outcomes Short: Start [facility] to help with mood. Long: Maintain a healthy mental state.    Continue Psychosocial Services  Follow up required by staff             Psychosocial Re-Evaluation:  Psychosocial Re-Evaluation     Row Name 03/02/23 0805 04/04/23 0854           Psychosocial Re-Evaluation   Current issues with None Identified Current Stress Concerns      Comments Patient reports no issues with their current mental states, sleep, stress, depression or anxiety. Will follow up with patient in a few weeks for any changes. Patient reports an added stress of extra driving with 2 sisters in rehab at different locations. No issues with mental states, sleep, or depression or anxiety. She has activities for stress relief involving playing cards and yardwork.      Expected Outcomes Short: Continue to exercise  regularly to support mental health and notify staff of any changes. Long: maintain mental health and well being through teaching of rehab or prescribed medications independently. Short: Reduce stress from extra driving with activities that relieve stress by playing cards and doing yardwork. Long: Continue to exercise regularly and participate in stress relief activities to maintain mental health and well being.      Interventions Encouraged to attend Pulmonary Rehabilitation for the exercise Encouraged to attend Pulmonary Rehabilitation for the exercise      Continue Psychosocial Services  Follow up required by staff Follow up required by staff               Psychosocial Discharge (Final Psychosocial Re-Evaluation):  Psychosocial Re-Evaluation - 04/04/23 0854       Psychosocial Re-Evaluation   Current issues with Current Stress Concerns    Comments Patient reports an added stress of extra driving with 2 sisters in rehab at different locations. No issues with mental states, sleep, or depression or anxiety. She has activities for stress relief involving playing cards and yardwork.    Expected Outcomes Short: Reduce stress from extra driving with activities that relieve stress by playing cards and doing yardwork. Long: Continue to exercise regularly and participate in stress relief activities to maintain mental health and well being.    Interventions Encouraged to attend Pulmonary Rehabilitation for the exercise    Continue Psychosocial Services  Follow up required by staff             Education: Education Goals: Education classes will be provided on a weekly basis, covering required topics. Participant will state understanding/return demonstration of topics presented.  Learning Barriers/Preferences:  Learning Barriers/Preferences - 01/25/23 1416       Learning Barriers/Preferences   Learning Barriers None    Learning Preferences None             General Pulmonary Education  Topics:  Infection Prevention: - Provides verbal and written material to individual with discussion of infection control including proper hand washing and proper equipment cleaning during exercise session. Flowsheet Row Pulmonary Rehab from 02/16/2023 in [facility] Cardiac and Pulmonary Rehab  Date 02/13/23  Educator MB  Instruction Review Code 1- Verbalizes Understanding       Falls Prevention: - Provides verbal and written material to individual with discussion of falls prevention and safety. Flowsheet Row Pulmonary Rehab from 02/16/2023 in [facility] Cardiac and Pulmonary Rehab  Date 02/13/23  Educator MB  Instruction Review Code 1- Verbalizes Understanding       Chronic Lung Disease Review: - Group verbal instruction with  posters, models, PowerPoint presentations and videos,  to review new updates, new respiratory medications, new advancements in procedures and treatments. Providing information on websites and "800" numbers for continued self-education. Includes information about supplement oxygen, available portable oxygen systems, continuous and intermittent flow rates, oxygen safety, concentrators, and Medicare reimbursement for oxygen. Explanation of Pulmonary Drugs, including class, frequency, complications, importance of spacers, rinsing mouth after steroid MDI's, and proper cleaning methods for nebulizers. Review of basic lung anatomy and physiology related to function, structure, and complications of lung disease. Review of risk factors. Discussion about methods for diagnosing sleep apnea and types of masks and machines for OSA. Includes a review of the use of types of environmental controls: home humidity, furnaces, filters, dust mite/pet prevention, HEPA vacuums. Discussion about weather changes, air quality and the benefits of nasal washing. Instruction on Warning signs, infection symptoms, calling MD promptly, preventive modes, and value of vaccinations. Review of effective airway clearance,  coughing and/or vibration techniques. Emphasizing that all should Create an Action Plan. Written material given at graduation. Flowsheet Row Pulmonary Rehab from 02/16/2023 in [facility]  Education need identified 02/13/23       AED/CPR: - Group verbal and written instruction with the use of models to demonstrate the basic use of the AED with the basic ABC's of resuscitation.    Anatomy and Cardiac Procedures: - Group verbal and visual presentation and models provide information about basic cardiac anatomy and function. Reviews the testing methods done to diagnose heart disease and the outcomes of the test results. Describes the treatment choices: Medical Management, Angioplasty, or Coronary Bypass Surgery for treating various heart conditions including Myocardial Infarction, Angina, Valve Disease, and Cardiac Arrhythmias.  Written material given at graduation.   Medication Safety: - Group verbal and visual instruction to review commonly prescribed medications for heart and lung disease. Reviews the medication, class of the drug, and side effects. Includes the steps to properly store meds and maintain the prescription regimen.  Written material given at graduation.   Other: -Provides group and verbal instruction on various topics (see comments)   Knowledge Questionnaire Score:  Knowledge Questionnaire Score - 02/13/23 1651       Knowledge Questionnaire Score   Pre Score 13/18              Core Components/Risk Factors/Patient Goals at Admission:  Personal Goals and Risk Factors at Admission - 02/13/23 1655       Core Components/Risk Factors/Patient Goals on Admission    Weight Management Yes;Weight Loss    Intervention Weight Management: Develop a combined nutrition and exercise program designed to reach desired caloric intake, while maintaining appropriate intake of nutrient and fiber, sodium and fats, and appropriate energy expenditure required for the  weight goal.;Weight Management: Provide education and appropriate resources to help participant work on and attain dietary goals.;Weight Management/Obesity: Establish reasonable short term and long term weight goals.;Obesity: Provide education and appropriate resources to help participant work on and attain dietary goals.    Admit Weight 216 lb 1.6 oz (98 kg)    Goal Weight: Short Term 206 lb 1.6 oz (93.5 kg)    Goal Weight: Long Term 196 lb 1.6 oz (89 kg)    Expected Outcomes Short Term: Continue to assess and modify interventions until short term weight is achieved;Long Term: Adherence to nutrition and physical activity/exercise program aimed toward attainment of established weight goal;Weight Maintenance: Understanding of the daily nutrition guidelines, which includes 25-35% calories from fat, 7% or less  cal from saturated fats, less than 200mg  cholesterol, less than 1.5gm of sodium, & 5 or more servings of fruits and vegetables daily;Weight Loss: Understanding of general recommendations for a balanced deficit meal plan, which promotes 1-2 lb weight loss per week and includes a negative energy balance of [id number] kcal/d;Understanding recommendations for meals to include 15-35% energy as protein, 25-35% energy from fat, 35-60% energy from carbohydrates, less than 200mg  of dietary cholesterol, 20-35 gm of total fiber daily;Understanding of distribution of calorie intake throughout the day with the consumption of 4-5 meals/snacks    Improve shortness of breath with ADL's Yes    Intervention Provide education, individualized exercise plan and daily activity instruction to help decrease symptoms of SOB with activities of daily living.    Expected Outcomes Short Term: Improve cardiorespiratory fitness to achieve a reduction of symptoms when performing ADLs;Long Term: Be able to perform more ADLs without symptoms or delay the onset of symptoms    Heart Failure Yes    Intervention Provide a combined exercise and  nutrition program that is supplemented with education, support and counseling about heart failure. Directed toward relieving symptoms such as shortness of breath, decreased exercise tolerance, and extremity edema.    Expected Outcomes Improve functional capacity of life;Short term: Attendance in program 2-3 days a week with increased exercise capacity. Reported lower sodium intake. Reported increased fruit and vegetable intake. Reports medication compliance.;Short term: Daily weights obtained and reported for increase. Utilizing diuretic protocols set by physician.;Long term: Adoption of self-care skills and reduction of barriers for early signs and symptoms recognition and intervention leading to self-care maintenance.    Hypertension Yes    Intervention Provide education on lifestyle modifcations including regular physical activity/exercise, weight management, moderate sodium restriction and increased consumption of fresh fruit, vegetables, and low fat dairy, alcohol moderation, and smoking cessation.;Monitor prescription use compliance.    Expected Outcomes Short Term: Continued assessment and intervention until BP is < 140/22mm HG in hypertensive participants. < 130/77mm HG in hypertensive participants with diabetes, heart failure or chronic kidney disease.;Long Term: Maintenance of blood pressure at goal levels.             Education:Diabetes - Individual verbal and written instruction to review signs/symptoms of diabetes, desired ranges of glucose level fasting, after meals and with exercise. Acknowledge that pre and post exercise glucose checks will be done for 3 sessions at entry of program.   Know Your Numbers and Heart Failure: - Group verbal and visual instruction to discuss disease risk factors for cardiac and pulmonary disease and treatment options.  Reviews associated critical values for Overweight/Obesity, Hypertension, Cholesterol, and Diabetes.  Discusses basics of heart failure:  signs/symptoms and treatments.  Introduces Heart Failure Zone chart for action plan for heart failure.  Written material given at graduation.   Core Components/Risk Factors/Patient Goals Review:   Goals and Risk Factor Review     Row Name 03/02/23 0805 04/04/23 0916           Core Components/Risk Factors/Patient Goals Review   Personal Goals Review Improve shortness of breath with ADL's Improve shortness of breath with ADL's;Hypertension      Review Spoke to patient about their shortness of breath and what they can do to improve. Patient has been informed of breathing techniques when starting the program. Patient is informed to tell staff if they have had any med changes and that certain meds they are taking or not taking can be causing shortness of breath. Spoke with patient about  their shortness of breath and she is seeing her pulmonologist today about her shortness of breath. She was also reminded of the breathing techniques when starting the program. Patient is not monitoring her blood pressure at home with her cuff at home. She was informed to start checking at home.      Expected Outcomes Short: Attend [facility] regularly to improve shortness of breath with ADL's. Long: maintain independence with ADL's Short: Attend [facility] regularly to improve shortness of breath with ADL's and check blood pressure at home. Long: maintain independence with ADL's and independence with monitoring blood pressure at home.               Core Components/Risk Factors/Patient Goals at Discharge (Final Review):   Goals and Risk Factor Review - 04/04/23 0916       Core Components/Risk Factors/Patient Goals Review   Personal Goals Review Improve shortness of breath with ADL's;Hypertension    Review Spoke with patient about their shortness of breath and she is seeing her pulmonologist today about her shortness of breath. She was also reminded of the breathing techniques when starting the program. Patient is  not monitoring her blood pressure at home with her cuff at home. She was informed to start checking at home.    Expected Outcomes Short: Attend [facility] regularly to improve shortness of breath with ADL's and check blood pressure at home. Long: maintain independence with ADL's and independence with monitoring blood pressure at home.             ITP Comments:  ITP Comments     Row Name 01/25/23 1418 02/13/23 1638 02/15/23 1214 02/16/23 0811 03/15/23 1304   ITP Comments Virtual Visit completed. Patient informed on EP and RD appointment and 6 Minute walk test. Patient also informed of patient health questionnaires on My Chart. Patient Verbalizes understanding. Visit diagnosis can be found in [facility] 01/[facility]. Completed and gym orientation. Initial ITP created and sent for review to Dr. Jinny Sanders, Medical Director. 30 Day review completed. Medical Director ITP review done, changes made as directed, and signed approval by Medical Director.   new to program First full day of exercise!  Patient was oriented to gym and equipment including functions, settings, policies, and procedures.  Patient's individual exercise prescription and treatment plan were reviewed.  All starting workloads were established based on the results of the 6 minute walk test done at initial orientation visit.  The plan for exercise progression was also introduced and progression will be customized based on patient's performance and goals. 30 Day review completed. Medical Director ITP review done, changes made as directed, and signed approval by Medical Director.    new to program    Row Name 04/05/23 1153           ITP Comments 30 Day review completed. Medical Director ITP review done, changes made as directed, and signed approval by Medical Director.                Comments:

## 2023-04-06 ENCOUNTER — Encounter: Payer: Medicare HMO | Admitting: *Deleted

## 2023-04-06 DIAGNOSIS — I5032 Chronic diastolic (congestive) heart failure: Secondary | ICD-10-CM

## 2023-04-06 NOTE — Progress Notes (Signed)
Daily Session Note  Patient Details  Name: Lauren Lloyd MRN: [id number] Date of Birth: June 16, 1940 Referring Provider:   Flowsheet Row Pulmonary Rehab from 02/13/2023 in [facility] Cardiac and Pulmonary Rehab  Referring Provider Julien Nordmann, MD       Encounter Date: 04/06/2023  Check In:  Session Check In - 04/06/23 0740       Check-In   Supervising physician immediately available to respond to emergencies See telemetry face sheet for immediately available ER MD    Location [facility]-Cardiac & Pulmonary Rehab    Staff Present Ronette Deter, BS, Exercise Physiologist;Joseph Parmelee, RCP,RRT,BSRT;Maxon [place] BS, , Exercise Physiologist;Ermon Sagan Katrinka Blazing, RN, [place]    Virtual Visit No    Medication changes reported     No    Fall or balance concerns reported    No    Warm-up and Cool-down Performed on first and last piece of equipment    Resistance Training Performed Yes    VAD Patient? No    PAD/SET Patient? No      Pain Assessment   Currently in Pain? No/denies                Social History   Tobacco Use  Smoking Status Former   Current packs/day: 0.00   Average packs/day: 1 pack/day for 30.0 years (30.0 ttl pk-yrs)   Types: Cigarettes   Start date: 05/17/1959   Quit date: 05/16/1989   Years since quitting: 33.9   Passive exposure: Past  Smokeless Tobacco Never    Goals Met:  Independence with exercise equipment Exercise tolerated well No report of concerns or symptoms today Strength training completed today  Goals Unmet:  Not Applicable  Comments: Pt able to follow exercise prescription today without complaint.  Will continue to monitor for progression.    Dr. Bethann Punches is Medical Director for [facility] Cardiac Rehabilitation.  Dr. Vida Rigger is Medical Director for [facility] Pulmonary Rehabilitation.

## 2023-04-11 ENCOUNTER — Encounter: Payer: Medicare HMO | Admitting: *Deleted

## 2023-04-11 DIAGNOSIS — I5032 Chronic diastolic (congestive) heart failure: Secondary | ICD-10-CM | POA: Diagnosis not present

## 2023-04-11 NOTE — Progress Notes (Signed)
Daily Session Note  Patient Details  Name: SALIMATOU KAMBER MRN: [id number] Date of Birth: 06-21-1940 Referring Provider:   Flowsheet Row Pulmonary Rehab from 02/13/2023 in [facility] Cardiac and Pulmonary Rehab  Referring Provider Julien Nordmann, MD       Encounter Date: 04/11/2023  Check In:  Session Check In - 04/11/23 0750       Check-In   Supervising physician immediately available to respond to emergencies See telemetry face sheet for immediately available ER MD    Location [facility]-Cardiac & Pulmonary Rehab    Staff Present Cora Collum, RN, BSN, CCRP;Noah Tickle, BS, Exercise Physiologist;Margaret Best, MS, Exercise Physiologist    Virtual Visit No    Medication changes reported     No    Fall or balance concerns reported    No    Warm-up and Cool-down Performed on first and last piece of equipment    Resistance Training Performed Yes    VAD Patient? No    PAD/SET Patient? No      Pain Assessment   Currently in Pain? No/denies                Social History   Tobacco Use  Smoking Status Former   Current packs/day: 0.00   Average packs/day: 1 pack/day for 30.0 years (30.0 ttl pk-yrs)   Types: Cigarettes   Start date: 05/17/1959   Quit date: 05/16/1989   Years since quitting: 33.9   Passive exposure: Past  Smokeless Tobacco Never    Goals Met:  Proper associated with RPD/PD & O2 Sat Independence with exercise equipment Exercise tolerated well No report of concerns or symptoms today  Goals Unmet:  Not Applicable  Comments: Pt able to follow exercise prescription today without complaint.  Will continue to monitor for progression.    Dr. Bethann Punches is Medical Director for [facility] Cardiac Rehabilitation.  Dr. Vida Rigger is Medical Director for [facility] Pulmonary Rehabilitation.

## 2023-04-12 ENCOUNTER — Telehealth: Payer: Self-pay | Admitting: Pharmacist

## 2023-04-12 NOTE — Telephone Encounter (Unsigned)
Copied from CRM [id number]. Topic: General - Inquiry >> Apr 12, 2023 11:01 AM De Blanch wrote: Reason for CRM: Pt is requesting to speak with the pharmacist. Stated she received a letter stating her year is up for assistance through [facility] her medication Eliquis.  Pt requested to speak with pharmacist for assistance in reapplying for this.  Please advise.

## 2023-04-12 NOTE — Progress Notes (Signed)
   04/12/2023  Patient ID: Lauren Lloyd, female   DOB: 12/12/1940, 82 y.o.   MRN: [id number]  Called and spoke with the patient on the phone regarding Eliquis PAP re-enrollment. Advised she will have to reach the 3% OOP Rx spend first which is $900 according to her social security amount monthly before qualifying for PAP coverage at beginning of 2025. Due to this, she had to pay two months of $500 to qualify last year and would have to do the same again.  Reviewed insurance plan. Each month after meeting deductible would have been $190, which is the same with the Xarelto as well. Reports she may be switching to [employer] instead and has a call scheduled next week with them. Medication would be $45 each month for all options, but needs to ask about drug deductible as this will have to be paid first.  Also reviewed alternative option of warfarin where she would have to attend office visits every 2-4 weeks and main concern is office visit cost with this.   Will talk with insurance company next week and consider switching to [employer] to stay on the Eliquis.    Marlowe Aschoff, PharmD [facility] Phone Number: [phone]

## 2023-04-18 ENCOUNTER — Encounter: Payer: Medicare HMO | Attending: Cardiovascular Disease | Admitting: *Deleted

## 2023-04-18 DIAGNOSIS — I5032 Chronic diastolic (congestive) heart failure: Secondary | ICD-10-CM | POA: Diagnosis not present

## 2023-04-18 NOTE — Progress Notes (Signed)
Daily Session Note  Patient Details  Name: Lauren Lloyd MRN: [id number] Date of Birth: Aug 19, 1940 Referring Provider:   Flowsheet Row Pulmonary Rehab from 02/13/2023 in [facility] Cardiac and Pulmonary Rehab  Referring Provider Julien Nordmann, MD       Encounter Date: 04/18/2023  Check In:  Session Check In - 04/18/23 0805       Check-In   Supervising physician immediately available to respond to emergencies See telemetry face sheet for immediately available ER MD    Location [facility]-Cardiac & Pulmonary Rehab    Staff Present Cora Collum, RN, BSN, CCRP;Margaret Best, MS, Exercise Physiologist;Maxon [place] BS, , Exercise Physiologist    Virtual Visit No    Medication changes reported     No    Fall or balance concerns reported    No    Warm-up and Cool-down Performed on first and last piece of equipment    Resistance Training Performed Yes    VAD Patient? No    PAD/SET Patient? No      Pain Assessment   Currently in Pain? No/denies                Social History   Tobacco Use  Smoking Status Former   Current packs/day: 0.00   Average packs/day: 1 pack/day for 30.0 years (30.0 ttl pk-yrs)   Types: Cigarettes   Start date: 05/17/1959   Quit date: 05/16/1989   Years since quitting: 33.9   Passive exposure: Past  Smokeless Tobacco Never    Goals Met:  Proper associated with RPD/PD & O2 Sat Independence with exercise equipment Exercise tolerated well No report of concerns or symptoms today  Goals Unmet:  Not Applicable  Comments: Pt able to follow exercise prescription today without complaint.  Will continue to monitor for progression.    Dr. Bethann Punches is Medical Director for [facility] Cardiac Rehabilitation.  Dr. Vida Rigger is Medical Director for [facility] Pulmonary Rehabilitation.

## 2023-04-20 ENCOUNTER — Encounter: Payer: Medicare HMO | Admitting: *Deleted

## 2023-04-20 DIAGNOSIS — I5032 Chronic diastolic (congestive) heart failure: Secondary | ICD-10-CM | POA: Diagnosis not present

## 2023-04-20 DIAGNOSIS — B078 Other viral warts: Secondary | ICD-10-CM | POA: Diagnosis not present

## 2023-04-20 DIAGNOSIS — L538 Other specified erythematous conditions: Secondary | ICD-10-CM | POA: Diagnosis not present

## 2023-04-20 DIAGNOSIS — Z85828 Personal history of other malignant neoplasm of skin: Secondary | ICD-10-CM | POA: Diagnosis not present

## 2023-04-20 DIAGNOSIS — D2272 Melanocytic nevi of left lower limb, including hip: Secondary | ICD-10-CM | POA: Diagnosis not present

## 2023-04-20 DIAGNOSIS — D2261 Melanocytic nevi of right upper limb, including shoulder: Secondary | ICD-10-CM | POA: Diagnosis not present

## 2023-04-20 DIAGNOSIS — D2262 Melanocytic nevi of left upper limb, including shoulder: Secondary | ICD-10-CM | POA: Diagnosis not present

## 2023-04-20 DIAGNOSIS — D225 Melanocytic nevi of trunk: Secondary | ICD-10-CM | POA: Diagnosis not present

## 2023-04-20 DIAGNOSIS — L821 Other seborrheic keratosis: Secondary | ICD-10-CM | POA: Diagnosis not present

## 2023-04-20 NOTE — Progress Notes (Signed)
Daily Session Note  Patient Details  Name: Lauren Lloyd MRN: [id number] Date of Birth: 1940/06/08 Referring Provider:   Flowsheet Row Pulmonary Rehab from 02/13/2023 in [facility] Cardiac and Pulmonary Rehab  Referring Provider Julien Nordmann, MD       Encounter Date: 04/20/2023  Check In:  Session Check In - 04/20/23 0743       Check-In   Supervising physician immediately available to respond to emergencies See telemetry face sheet for immediately available ER MD    Location [facility]-Cardiac & Pulmonary Rehab    Staff Present Cora Collum, RN, BSN, CCRP;Noah Tickle, BS, Exercise Physiologist;Joseph Hood, RCP,RRT,BSRT;Maxon [place] BS, , Exercise Physiologist    Virtual Visit No    Medication changes reported     No    Fall or balance concerns reported    No    Warm-up and Cool-down Performed on first and last piece of equipment    Resistance Training Performed Yes    VAD Patient? No    PAD/SET Patient? No      Pain Assessment   Currently in Pain? No/denies                Social History   Tobacco Use  Smoking Status Former   Current packs/day: 0.00   Average packs/day: 1 pack/day for 30.0 years (30.0 ttl pk-yrs)   Types: Cigarettes   Start date: 05/17/1959   Quit date: 05/16/1989   Years since quitting: 33.9   Passive exposure: Past  Smokeless Tobacco Never    Goals Met:  Proper associated with RPD/PD & O2 Sat Independence with exercise equipment Exercise tolerated well No report of concerns or symptoms today  Goals Unmet:  Not Applicable  Comments: Pt able to follow exercise prescription today without complaint.  Will continue to monitor for progression.    Dr. Bethann Punches is Medical Director for [facility] Cardiac Rehabilitation.  Dr. Vida Rigger is Medical Director for [facility] Pulmonary Rehabilitation.

## 2023-04-25 ENCOUNTER — Encounter: Payer: Medicare HMO | Admitting: *Deleted

## 2023-04-25 DIAGNOSIS — I5032 Chronic diastolic (congestive) heart failure: Secondary | ICD-10-CM

## 2023-04-25 NOTE — Progress Notes (Signed)
Daily Session Note  Patient Details  Name: VERITA CAVANNA MRN: [id number] Date of Birth: [birth year] Referring Provider:   Flowsheet Row Pulmonary Rehab from 02/13/2023 in [facility] Cardiac and Pulmonary Rehab  Referring Provider Julien Nordmann, MD       Encounter Date: 04/25/2023  Check In:  Session Check In - 04/25/23 0750       Check-In   Supervising physician immediately available to respond to emergencies See telemetry face sheet for immediately available ER MD    Location [facility]-Cardiac & Pulmonary Rehab    Staff Present Cora Collum, RN, BSN, CCRP;Noah Tickle, BS, Exercise Physiologist;Maxon Conetta BS, , Exercise Physiologist    Virtual Visit No    Medication changes reported     No    Fall or balance concerns reported    No    Warm-up and Cool-down Performed on first and last piece of equipment    Resistance Training Performed Yes    VAD Patient? No    PAD/SET Patient? No      Pain Assessment   Currently in Pain? No/denies                Social History   Tobacco Use  Smoking Status Former   Current packs/day: 0.00   Average packs/day: 1 pack/day for 30.0 years (30.0 ttl pk-yrs)   Types: Cigarettes   Start date: 05/17/1959   Quit date: 05/16/1989   Years since quitting: 33.9   Passive exposure: Past  Smokeless Tobacco Never    Goals Met:  Proper associated with RPD/PD & O2 Sat Independence with exercise equipment Exercise tolerated well No report of concerns or symptoms today  Goals Unmet:  Not Applicable  Comments: Pt able to follow exercise prescription today without complaint.  Will continue to monitor for progression.    Dr. Bethann Punches is Medical Director for [facility] Cardiac Rehabilitation.  Dr. Vida Rigger is Medical Director for [facility] Pulmonary Rehabilitation.

## 2023-04-26 ENCOUNTER — Ambulatory Visit: Payer: Medicare HMO

## 2023-04-26 ENCOUNTER — Ambulatory Visit
Admission: RE | Admit: 2023-04-26 | Discharge: 2023-04-26 | Disposition: A | Discharge status: Home / Self Care | Payer: Medicare HMO | Source: Ambulatory Visit | Attending: Family Medicine | Admitting: Family Medicine

## 2023-04-26 DIAGNOSIS — Z1231 Encounter for screening mammogram for malignant neoplasm of breast: Secondary | ICD-10-CM | POA: Diagnosis not present

## 2023-04-27 ENCOUNTER — Encounter: Payer: Medicare HMO | Admitting: *Deleted

## 2023-04-27 DIAGNOSIS — I5032 Chronic diastolic (congestive) heart failure: Secondary | ICD-10-CM | POA: Diagnosis not present

## 2023-04-27 NOTE — Progress Notes (Signed)
Daily Session Note  Patient Details  Name: Lauren Lloyd MRN: [id number] Date of Birth: 1940/11/11 Referring Provider:   Flowsheet Row Pulmonary Rehab from 02/13/2023 in [facility] Cardiac and Pulmonary Rehab  Referring Provider Julien Nordmann, MD       Encounter Date: 04/27/2023  Check In:  Session Check In - 04/27/23 0742       Check-In   Supervising physician immediately available to respond to emergencies See telemetry face sheet for immediately available ER MD    Location [facility]-Cardiac & Pulmonary Rehab    Staff Present Ronette Deter, BS, Exercise Physiologist;Joseph Reino Kent, Guinevere Ferrari, RN, ADN;Maxon [employer], , Exercise Physiologist    Virtual Visit No    Medication changes reported     No    Fall or balance concerns reported    No    Warm-up and Cool-down Performed on first and last piece of equipment    Resistance Training Performed Yes    VAD Patient? No    PAD/SET Patient? No      Pain Assessment   Currently in Pain? No/denies                Social History   Tobacco Use  Smoking Status Former   Current packs/day: 0.00   Average packs/day: 1 pack/day for 30.0 years (30.0 ttl pk-yrs)   Types: Cigarettes   Start date: 05/17/1959   Quit date: 05/16/1989   Years since quitting: 33.9   Passive exposure: Past  Smokeless Tobacco Never    Goals Met:  Independence with exercise equipment Exercise tolerated well No report of concerns or symptoms today Strength training completed today  Goals Unmet:  Not Applicable  Comments: Pt able to follow exercise prescription today without complaint.  Will continue to monitor for progression.    Dr. Bethann Punches is Medical Director for [facility] Cardiac Rehabilitation.  Dr. Vida Rigger is Medical Director for [facility] Pulmonary Rehabilitation.

## 2023-05-02 ENCOUNTER — Encounter: Payer: Medicare HMO | Admitting: *Deleted

## 2023-05-02 DIAGNOSIS — I5032 Chronic diastolic (congestive) heart failure: Secondary | ICD-10-CM

## 2023-05-02 NOTE — Progress Notes (Signed)
Daily Session Note  Patient Details  Name: Lauren Lloyd MRN: [id number] Date of Birth: Jun 11, 1940 Referring Provider:   Flowsheet Row Pulmonary Rehab from 02/13/2023 in [facility] Cardiac and Pulmonary Rehab  Referring Provider Julien Nordmann, MD       Encounter Date: 05/02/2023  Check In:  Session Check In - 05/02/23 3474       Check-In   Supervising physician immediately available to respond to emergencies See telemetry face sheet for immediately available ER MD    Location [facility]-Cardiac & Pulmonary Rehab    Staff Present Rory Percy, MS, Exercise Physiologist;Steven Basso, RN, BSN, CCRP;Noah Tickle, BS, Exercise Physiologist;Maxon Conetta BS, , Exercise Physiologist    Virtual Visit No    Medication changes reported     No    Fall or balance concerns reported    No    Warm-up and Cool-down Performed on first and last piece of equipment    Resistance Training Performed Yes    VAD Patient? No    PAD/SET Patient? No      Pain Assessment   Currently in Pain? No/denies                Social History   Tobacco Use  Smoking Status Former   Current packs/day: 0.00   Average packs/day: 1 pack/day for 30.0 years (30.0 ttl pk-yrs)   Types: Cigarettes   Start date: 05/17/1959   Quit date: 05/16/1989   Years since quitting: 33.9   Passive exposure: Past  Smokeless Tobacco Never    Goals Met:  Proper associated with RPD/PD & O2 Sat Independence with exercise equipment Exercise tolerated well No report of concerns or symptoms today  Goals Unmet:  Not Applicable  Comments: Pt able to follow exercise prescription today without complaint.  Will continue to monitor for progression.    Dr. Bethann Punches is Medical Director for [facility] Cardiac Rehabilitation.  Dr. Vida Rigger is Medical Director for [facility] Pulmonary Rehabilitation.

## 2023-05-03 ENCOUNTER — Encounter: Payer: Self-pay | Admitting: *Deleted

## 2023-05-03 DIAGNOSIS — I5032 Chronic diastolic (congestive) heart failure: Secondary | ICD-10-CM

## 2023-05-03 NOTE — Progress Notes (Signed)
Pulmonary Individual Treatment Plan  Patient Details  Name: AYEESHA CONRADI MRN: [id number] Date of Birth: 26-Jul-1940 Referring Provider:   Flowsheet Row Pulmonary Rehab from 02/13/2023 in [facility] Cardiac and Pulmonary Rehab  Referring Provider Julien Nordmann, MD       Initial Encounter Date:  Flowsheet Row Pulmonary Rehab from 02/13/2023 in [facility] Cardiac and Pulmonary Rehab  Date 02/13/23       Visit Diagnosis: Heart failure, diastolic, chronic (HCC)  Patient's Home Medications on Admission:  Current Outpatient Medications:    acetaminophen (TYLENOL) 325 MG tablet, Take 650 mg by mouth every 6 (six) hours as needed for moderate pain., Disp: , Rfl:    allopurinol (ZYLOPRIM) 100 MG tablet, TAKE ONE TABLET BY MOUTH EVERY DAY, Disp: 90 tablet, Rfl: 1   bisoprolol (ZEBETA) 10 MG tablet, Take 1 tablet (10 mg total) by mouth 2 (two) times daily., Disp: 180 tablet, Rfl: 3   calcium carbonate (TUMS EX) 750 MG chewable tablet, Chew 2 tablets by mouth daily as needed for heartburn., Disp: , Rfl:    Cholecalciferol 25 MCG (1000 UT) tablet, Take 1,000 Units by mouth daily., Disp: , Rfl:    ELIQUIS 5 MG TABS tablet, TAKE ONE TABLET TWICE DAILY, Disp: 180 tablet, Rfl: 1   ezetimibe (ZETIA) 10 MG tablet, TAKE 1 TABLET BY MOUTH DAILY, Disp: 90 tablet, Rfl: 3   furosemide (LASIX) 40 MG tablet, TAKE 1 TABLET BY MOUTH DAILY. TAKE AN EXTRA TABLET AS NEEDED AFTER LUNCH FOR ABDOMINAL SWELLING, LEG SWELLING OR SHORTNESS OF BREATH, Disp: 180 tablet, Rfl: 1   gabapentin (NEURONTIN) 600 MG tablet, TAKE ONE TABLET BY MOUTH AT BEDTIME, Disp: 90 tablet, Rfl: 0   levalbuterol (XOPENEX HFA) 45 MCG/ACT inhaler, Inhale 2 puffs into the lungs every 8 (eight) hours as needed for wheezing., Disp: 1 each, Rfl: 2   Magnesium 300 MG CAPS, Take by mouth., Disp: , Rfl:    Menthol, Topical Analgesic, (BIOFREEZE EX), Apply 1 application. topically daily as needed (Neck pain)., Disp: , Rfl:    metaxalone (SKELAXIN) 800 MG tablet,  Take 1 tablet (800 mg total) by mouth daily as needed for muscle spasms., Disp: 30 tablet, Rfl: 2   Multiple Vitamin (MULTIVITAMIN) capsule, Take 1 capsule by mouth daily., Disp: , Rfl:    mupirocin ointment (BACTROBAN) 2 %, Place 1 application  into the nose 2 (two) times daily., Disp: 22 g, Rfl: 0   naltrexone (DEPADE) 50 MG tablet, TAKE 1/2 TABLET BY MOUTH DAILY, Disp: 15 tablet, Rfl: 2   potassium chloride (KLOR-CON) 10 MEQ tablet, TAKE 1 TABLET BY MOUTH DAILY, Disp: 30 tablet, Rfl: 5   Tiotropium Bromide-Olodaterol (STIOLTO RESPIMAT) 2.5-2.5 MCG/ACT AERS, Inhale 2 puffs into the lungs daily., Disp: 4 g, Rfl: 0  Past Medical History: Past Medical History:  Diagnosis Date   (HFpEF) heart failure with preserved ejection fraction (HCC)    a. 05/2018 Echo: EF 55-60%, no rwma, mild to mod MR. Nl RV fxn. Mod TR. PASP .   Arthritis    knees, Hands   Arthritis of knee    Back pain    Carotid arterial disease (HCC)    a. 03/2019 Carotid U/S: <50% bilat ICA stenoses.   Cholelithiasis    a. 10/2018 noted on CT.   Edema, lower extremity    Fatty liver    GERD (gastroesophageal reflux disease)    History of stress test    a. 06/2018 MV: EF 59%, no ischemia/infarct. Low risk.   Knee pain  Lactose intolerance    Mitral regurgitation    a. 05/2018 Echo: mild to mod MR.   Multinodular goiter    Obesity    OSA (obstructive sleep apnea)    PAF (paroxysmal atrial fibrillation) (HCC)    a.  Diagnosed 12/19; b. 05/2018 s/p DCCV; c. 03/2019 & 05/2019 recurrent AFib-->managed w/ amio load; d. CHADS2VASc = 6 (CHF, HTN, age x 2, vascular disease, female)-->Eliquis & amio 100 qd.   PAH (pulmonary artery hypertension) (HCC)    RSV (acute bronchiolitis due to respiratory syncytial virus) 05/10/2022   Scoliosis    SOB (shortness of breath)    Swallowing difficulty     Tobacco Use: Social History   Tobacco Use  Smoking Status Former   Current packs/day: 0.00   Average packs/day: 1 pack/day for  30.0 years (30.0 ttl pk-yrs)   Types: Cigarettes   Start date: 05/17/1959   Quit date: 05/16/1989   Years since quitting: 33.9   Passive exposure: Past  Smokeless Tobacco Never    Labs: Review Flowsheet  More data exists      Latest Ref Rng & Units 12/09/2019 08/03/2020 08/16/2021 03/17/2022 10/28/2022  Labs for ITP Cardiac and Pulmonary Rehab  Cholestrol 100 - 199 mg/dL 161  096  CANCELED  045  176   LDL (calc) 0 - 99 mg/dL 409  811  - 93  914   HDL-C >39 mg/dL 73  65  CANCELED  52  54   Trlycerides 0 - 149 mg/dL 782  956  CANCELED  94  126   Hemoglobin A1c 4.8 - 5.6 % 5.5  - - 6.0  6.0      Pulmonary Assessment Scores:  Pulmonary Assessment Scores     Row Name 02/13/23 1645         ADL UCSD   ADL Phase Entry     SOB Score total 57     Rest 0     Walk 3     Stairs 4     Bath 1     Dress 1     Shop 1       CAT Score   CAT Score 23       mMRC Score   mMRC Score 2              UCSD: Self-administered rating of dyspnea associated with activities of daily living (ADLs) 6-point scale (0 = "not at all" to 5 = "maximal or unable to do because of breathlessness")  Scoring Scores range from 0 to 120.  Minimally important difference is 5 units  CAT: CAT can identify the health impairment of COPD patients and is better correlated with disease progression.  CAT has a scoring range of zero to 40. The CAT score is classified into four groups of low (less than 10), medium (10 - 20), high (21-30) and very high (31-40) based on the impact level of disease on health status. A CAT score over 10 suggests significant symptoms.  A worsening CAT score could be explained by an exacerbation, poor medication adherence, poor inhaler technique, or progression of COPD or comorbid conditions.  CAT MCID is 2 points  mMRC: mMRC (Modified Medical Research Council) Dyspnea Scale is used to assess the degree of baseline functional disability in patients of respiratory disease due to dyspnea. No  minimal important difference is established. A decrease in score of 1 point or greater is considered a positive change.   Pulmonary Function Assessment:  Pulmonary Function  Assessment - 01/25/23 1416       Breath   Shortness of Breath Yes;Limiting activity             Exercise Target Goals: Exercise Program Goal: Individual exercise prescription set using results from initial 6 min walk test and THRR while considering  patient's activity barriers and safety.   Exercise Prescription Goal: Initial exercise prescription builds to 30-45 minutes a day of aerobic activity, 2-3 days per week.  Home exercise guidelines will be given to patient during program as part of exercise prescription that the participant will acknowledge.  Education: Aerobic Exercise: - Group verbal and visual presentation on the components of exercise prescription. Introduces F.I.T.T principle from ACSM for exercise prescriptions.  Reviews F.I.T.T. principles of aerobic exercise including progression. Written material given at graduation.   Education: Resistance Exercise: - Group verbal and visual presentation on the components of exercise prescription. Introduces F.I.T.T principle from ACSM for exercise prescriptions  Reviews F.I.T.T. principles of resistance exercise including progression. Written material given at graduation.    Education: Exercise & Equipment Safety: - Individual verbal instruction and demonstration of equipment use and safety with use of the equipment. Flowsheet Row Pulmonary Rehab from 04/27/2023 in [facility] Cardiac and Pulmonary Rehab  Date 02/13/23  Educator MB  Instruction Review Code 1- Verbalizes Understanding       Education: Exercise Physiology & General Exercise Guidelines: - Group verbal and written instruction with models to review the exercise physiology of the cardiovascular system and associated critical values. Provides general exercise guidelines with specific guidelines to  those with heart or lung disease.  Flowsheet Row Pulmonary Rehab from 04/27/2023 in [facility] Cardiac and Pulmonary Rehab  Education need identified 02/13/23       Education: Flexibility, Balance, Mind/Body Relaxation: - Group verbal and visual presentation with interactive activity on the components of exercise prescription. Introduces F.I.T.T principle from ACSM for exercise prescriptions. Reviews F.I.T.T. principles of flexibility and balance exercise training including progression. Also discusses the mind body connection.  Reviews various relaxation techniques to help reduce and manage stress (i.e. Deep breathing, progressive muscle relaxation, and visualization). Balance handout provided to take home. Written material given at graduation.   Activity Barriers & Risk Stratification:  Activity Barriers & Cardiac Risk Stratification - 02/13/23 1640       Activity Barriers & Cardiac Risk Stratification   Activity Barriers Arthritis;Back Problems;Shortness of Breath             6 Minute Walk:  6 Minute Walk     Row Name 02/13/23 1638         6 Minute Walk   Phase Initial     Distance 945 feet     Walk Time 6 minutes     # of Rest Breaks 0     MPH 1.79     METS 1.54     RPE 15     Perceived Dyspnea  3     VO2 Peak 5.41     Symptoms Yes (comment)     Comments back pain     Resting HR 70 bpm     Resting BP 130/74     Resting Oxygen Saturation  92 %     Exercise Oxygen Saturation  during 6 min walk 91 %     Max Ex. HR 111 bpm     Max Ex. BP 166/72     2 Minute Post BP 150/74       Interval HR   1  Minute HR 103     2 Minute HR 107     3 Minute HR 109     4 Minute HR 111     5 Minute HR 82     6 Minute HR 83     2 Minute Post HR 70     Interval Heart Rate? Yes       Interval Oxygen   Interval Oxygen? Yes     Baseline Oxygen Saturation % 92 %     1 Minute Oxygen Saturation % 91 %     1 Minute Liters of Oxygen 0 L     2 Minute Oxygen Saturation % 92 %     2 Minute  Liters of Oxygen 0 L     3 Minute Oxygen Saturation % 91 %     3 Minute Liters of Oxygen 0 L     4 Minute Oxygen Saturation % 91 %     4 Minute Liters of Oxygen 0 L     5 Minute Oxygen Saturation % 95 %     5 Minute Liters of Oxygen 0 L     6 Minute Oxygen Saturation % 93 %     6 Minute Liters of Oxygen 0 L     2 Minute Post Oxygen Saturation % 96 %     2 Minute Post Liters of Oxygen 0 L             Oxygen Initial Assessment:  Oxygen Initial Assessment - 01/25/23 1415       Home Oxygen   Home Oxygen Device None    Sleep Oxygen Prescription CPAP    Liters per minute --   off and on   Home Exercise Oxygen Prescription None    Home Resting Oxygen Prescription None    Compliance with Home Oxygen Use Yes      Initial 6 min Walk   Oxygen Used None      Program Oxygen Prescription   Program Oxygen Prescription None      Intervention   Short Term Goals To learn and exhibit compliance with exercise, home and travel O2 prescription;To learn and understand importance of monitoring SPO2 with pulse oximeter and demonstrate accurate use of the pulse oximeter.;To learn and understand importance of maintaining oxygen saturations>88%;To learn and demonstrate proper pursed lip breathing techniques or other breathing techniques. ;To learn and demonstrate proper use of respiratory medications    Long  Term Goals Exhibits compliance with exercise, home  and travel O2 prescription;Verbalizes importance of monitoring SPO2 with pulse oximeter and return demonstration;Maintenance of O2 saturations>88%;Exhibits proper breathing techniques, such as pursed lip breathing or other method taught during program session;Compliance with respiratory medication;Demonstrates proper use of MDI's             Oxygen Re-Evaluation:  Oxygen Re-Evaluation     Row Name 02/16/23 0813 03/02/23 0802 04/04/23 0850         Program Oxygen Prescription   Program Oxygen Prescription -- None None       Home Oxygen    Home Oxygen Device -- None None     Sleep Oxygen Prescription -- CPAP CPAP     Liters per minute -- -- --  off and on     Home Exercise Oxygen Prescription -- None None     Home Resting Oxygen Prescription -- None None     Compliance with Home Oxygen Use -- No No       Goals/Expected Outcomes  Short Term Goals -- To learn and demonstrate proper pursed lip breathing techniques or other breathing techniques.  To learn and demonstrate proper pursed lip breathing techniques or other breathing techniques.      Long  Term Goals -- Exhibits proper breathing techniques, such as pursed lip breathing or other method taught during program session Exhibits proper breathing techniques, such as pursed lip breathing or other method taught during program session     Comments Reviewed PLB technique with pt.  Talked about how it works and it's importance in maintaining their exercise saturations. Informed patient how to perform the Pursed Lipped breathing technique. Told patient to Inhale through the nose and out the mouth with pursed lips to keep their airways open, help oxygenate them better, practice when at rest or doing strenuous activity. Patient Verbalizes understanding of technique and will work on and be reiterated during [facility]. Reviewed pursed lipped breathing with patient. She verbalized understanding and will continue to work on it during rest and during exercise.     Goals/Expected Outcomes Short: Become more profiecient at using PLB. Long: Become independent at using PLB. Short: use PLB with exertion. Long: use PLB on exertion proficiently and independently. Short: use pursed lipped breathing with rest and exercise. Long: use pursed lipped breathing independently with exercise.              Oxygen Discharge (Final Oxygen Re-Evaluation):  Oxygen Re-Evaluation - 04/04/23 0850       Program Oxygen Prescription   Program Oxygen Prescription None      Home Oxygen   Home Oxygen Device None     Sleep Oxygen Prescription CPAP    Liters per minute --   off and on   Home Exercise Oxygen Prescription None    Home Resting Oxygen Prescription None    Compliance with Home Oxygen Use No      Goals/Expected Outcomes   Short Term Goals To learn and demonstrate proper pursed lip breathing techniques or other breathing techniques.     Long  Term Goals Exhibits proper breathing techniques, such as pursed lip breathing or other method taught during program session    Comments Reviewed pursed lipped breathing with patient. She verbalized understanding and will continue to work on it during rest and during exercise.    Goals/Expected Outcomes Short: use pursed lipped breathing with rest and exercise. Long: use pursed lipped breathing independently with exercise.             Initial Exercise Prescription:  Initial Exercise Prescription - 02/13/23 1600       Date of Initial Exercise RX and Referring Provider   Date 02/13/23    Referring Provider Julien Nordmann, MD      Oxygen   Maintain Oxygen Saturation 88% or higher      Recumbant Bike   Level 1    RPM 50    Watts 14    Minutes 15    METs 1.54      NuStep   Level 1    SPM 80    Minutes 15    METs 1.54      Biostep-RELP   Level 1    SPM 50    Minutes 15    METs 1.54      Track   Laps 8    Minutes 15    METs 1.44      Prescription Details   Frequency (times per week) 2    Duration Progress to 30 minutes of  continuous aerobic without signs/symptoms of physical distress      Intensity   THRR 40-80% of Max Heartrate 96-123    Ratings of Perceived Exertion 11-13    Perceived Dyspnea 0-4      Progression   Progression Continue to progress workloads to maintain intensity without signs/symptoms of physical distress.      Resistance Training   Training Prescription Yes    Weight 5lb    Reps 10-15             Perform Capillary Blood Glucose checks as needed.  Exercise Prescription Changes:   Exercise  Prescription Changes     Row Name 02/13/23 1600 02/22/23 1100 03/09/23 0800 03/22/23 1600 04/06/23 1500     Response to Exercise   Blood Pressure (Admit) 130/74 146/74 164/82 140/70 150/74   Blood Pressure (Exercise) 166/72 116/70 148/62 134/60 122/60   Blood Pressure (Exit) 130/74 130/76 120/58 128/56 126/64   Heart Rate (Admit) 70 bpm 70 bpm 72 bpm 75 bpm 91 bpm   Heart Rate (Exercise) 111 bpm 81 bpm 123 bpm 128 bpm 91 bpm   Heart Rate (Exit) 70 bpm 70 bpm 72 bpm 70 bpm 77 bpm   Oxygen Saturation (Admit) 92 % 96 % 99 % 99 % 91 %   Oxygen Saturation (Exercise) 91 % 92 % 92 % 92 % 91 %   Oxygen Saturation (Exit) 94 % 95 % 95 % 97 % 96 %   Rating of Perceived Exertion (Exercise) 15 14 13 14 13    Perceived Dyspnea (Exercise) 3 3 2 1  0   Symptoms back pain none none none knee pain   Comments results -- -- -- --   Duration Progress to 30 minutes of  aerobic without signs/symptoms of physical distress Progress to 30 minutes of  aerobic without signs/symptoms of physical distress Progress to 30 minutes of  aerobic without signs/symptoms of physical distress Progress to 30 minutes of  aerobic without signs/symptoms of physical distress Progress to 30 minutes of  aerobic without signs/symptoms of physical distress   Intensity THRR New THRR unchanged THRR unchanged THRR unchanged THRR unchanged     Progression   Progression Continue to progress workloads to maintain intensity without signs/symptoms of physical distress. Continue to progress workloads to maintain intensity without signs/symptoms of physical distress. Continue to progress workloads to maintain intensity without signs/symptoms of physical distress. Continue to progress workloads to maintain intensity without signs/symptoms of physical distress. Continue to progress workloads to maintain intensity without signs/symptoms of physical distress.   Average METs 1.54 2.05 1.97 2.07 2.78     Resistance Training   Training Prescription --  Yes Yes Yes Yes   Weight -- 5lb 5lb 5lb 5lb   Reps -- 10-15 10-15 10-15 10-15     Interval Training   Interval Training -- No No No No     Treadmill   MPH -- -- -- 1.4 --   Grade -- -- -- 0 --   Minutes -- -- -- 15 --   METs -- -- -- 2.07 --     Recumbant Bike   Level -- -- -- -- 1   Watts -- -- -- -- 14   Minutes -- -- -- -- 15   METs -- -- -- -- 2.45     NuStep   Level -- -- 2 2 3    Minutes -- -- 15 15 15    METs -- -- 2.1 2.6 3.1     Biostep-RELP  Level -- 1 1 1  --   Minutes -- 15 15 15  --   METs -- 2 2 2  --     Track   Laps -- 20  Hallway 15  Hallway 17  hallway --   Minutes -- 15 15 15  --   METs -- 2.09 1.82 1.92 --     Oxygen   Maintain Oxygen Saturation -- 88% or higher 88% or higher 88% or higher 88% or higher    Row Name 04/17/23 1100 04/20/23 0700           Response to Exercise   Blood Pressure (Admit) 142/72 --      Blood Pressure (Exercise) 158/80 --      Blood Pressure (Exit) 144/70 --      Heart Rate (Admit) 79 bpm --      Heart Rate (Exercise) 93 bpm --      Heart Rate (Exit) 72 bpm --      Oxygen Saturation (Admit) 100 % --      Oxygen Saturation (Exercise) 93 % --      Oxygen Saturation (Exit) 95 % --      Rating of Perceived Exertion (Exercise) 15 --      Perceived Dyspnea (Exercise) 0 --      Symptoms none --      Duration Progress to 30 minutes of  aerobic without signs/symptoms of physical distress --      Intensity THRR unchanged --        Progression   Progression Continue to progress workloads to maintain intensity without signs/symptoms of physical distress. --      Average METs 2.7 --        Resistance Training   Training Prescription Yes --      Weight 5lb --      Reps 10-15 --        Interval Training   Interval Training No --        Treadmill   MPH 1.5 --      Grade 0 --      Minutes 15 --      METs 2.15 --        Recumbant Bike   Level 1.5 --      Watts 14 --      Minutes 15 --      METs 2.45 --         NuStep   Level 4 --      Minutes 15 --      METs 3.6 --        Biostep-RELP   Level 2 --      Minutes 15 --        Track   Laps 17 --      Minutes 15 --      METs 1.92 --        Home Exercise Plan   Plans to continue exercise at -- Home (comment)  Nakeysha plans to try walking for 30 minutes twice a week, outside at home. She struggles walking for 15 minutes in the program but states she can break up the home ex. to 15 minute intervals.      Frequency -- Add 2 additional days to program exercise sessions.      Initial Home Exercises Provided -- 04/20/23        Oxygen   Maintain Oxygen Saturation 88% or higher 88% or higher  Exercise Comments:   Exercise Comments     Row Name 02/16/23 [id number]           Exercise Comments First full day of exercise!  Patient was oriented to gym and equipment including functions, settings, policies, and procedures.  Patient's individual exercise prescription and treatment plan were reviewed.  All starting workloads were established based on the results of the 6 minute walk test done at initial orientation visit.  The plan for exercise progression was also introduced and progression will be customized based on patient's performance and goals.                Exercise Goals and Review:   Exercise Goals     Row Name 02/13/23 1644             Exercise Goals   Increase Physical Activity Yes       Intervention Provide advice, education, support and counseling about physical activity/exercise needs.;Develop an individualized exercise prescription for aerobic and resistive training based on initial evaluation findings, risk stratification, comorbidities and participant's personal goals.       Expected Outcomes Short Term: Attend rehab on a regular basis to increase amount of physical activity.;Long Term: Exercising regularly at least 3-5 days a week.;Long Term: Add in home exercise to make exercise part of routine and to increase  amount of physical activity.       Increase Strength and Stamina Yes       Intervention Provide advice, education, support and counseling about physical activity/exercise needs.;Develop an individualized exercise prescription for aerobic and resistive training based on initial evaluation findings, risk stratification, comorbidities and participant's personal goals.       Expected Outcomes Short Term: Increase workloads from initial exercise prescription for resistance, speed, and METs.;Short Term: Perform resistance training exercises routinely during rehab and add in resistance training at home;Long Term: Improve cardiorespiratory fitness, muscular endurance and strength as measured by increased METs and functional capacity ( )       Able to understand and use rate of perceived exertion (RPE) scale Yes       Intervention Provide education and explanation on how to use RPE scale       Expected Outcomes Short Term: Able to use RPE daily in rehab to express subjective intensity level;Long Term:  Able to use RPE to guide intensity level when exercising independently       Able to understand and use Dyspnea scale Yes       Intervention Provide education and explanation on how to use Dyspnea scale       Expected Outcomes Short Term: Able to use Dyspnea scale daily in rehab to express subjective sense of shortness of breath during exertion;Long Term: Able to use Dyspnea scale to guide intensity level when exercising independently       Knowledge and understanding of Target Heart Rate Range (THRR) Yes       Intervention Provide education and explanation of THRR including how the numbers were predicted and where they are located for reference       Expected Outcomes Short Term: Able to state/look up THRR;Long Term: Able to use THRR to govern intensity when exercising independently;Short Term: Able to use daily as guideline for intensity in rehab       Able to check pulse independently Yes       Intervention  Provide education and demonstration on how to check pulse in carotid and radial arteries.;Review the importance of being able to check your  own pulse for safety during independent exercise       Expected Outcomes Short Term: Able to explain why pulse checking is important during independent exercise;Long Term: Able to check pulse independently and accurately       Understanding of Exercise Prescription Yes       Intervention Provide education, explanation, and written materials on patient's individual exercise prescription       Expected Outcomes Short Term: Able to explain program exercise prescription;Long Term: Able to explain home exercise prescription to exercise independently                Exercise Goals Re-Evaluation :  Exercise Goals Re-Evaluation     Row Name 02/16/23 0811 02/22/23 1122 03/09/23 0855 03/22/23 1617 04/06/23 1552     Exercise Goal Re-Evaluation   Exercise Goals Review Able to understand and use rate of perceived exertion (RPE) scale;Able to understand and use Dyspnea scale;Knowledge and understanding of Target Heart Rate Range (THRR);Understanding of Exercise Prescription Increase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 alcohol moderation, and smoking cessation.;Monitor prescription use compliance.    Expected Outcomes Short Term: Continued assessment and intervention until BP is < 140/77mm HG in hypertensive participants. < 130/8mm HG in hypertensive participants with diabetes, heart failure or chronic kidney disease.;Long Term: Maintenance of blood pressure at goal levels.             Education:Diabetes - Individual verbal and written instruction to review signs/symptoms of diabetes, desired ranges of glucose level fasting, after meals and with exercise. Acknowledge that pre and post exercise glucose checks will be done for 3 sessions at entry of program.   Know Your Numbers and Heart Failure: - Group verbal and visual instruction to discuss disease risk factors for cardiac and pulmonary disease and treatment options.  Reviews associated critical values for Overweight/Obesity, Hypertension, Cholesterol, and Diabetes.  Discusses basics of heart failure: signs/symptoms and treatments.  Introduces Heart Failure Zone chart for action plan for heart failure.  Written material given at graduation.   Core Components/Risk Factors/Patient Goals Review:   Goals and Risk Factor Review     Row Name 03/02/23 0805 04/04/23 0916 05/02/23 0807         Core Components/Risk Factors/Patient Goals Review   Personal Goals Review Improve shortness of breath with ADL's Improve shortness of breath with ADL's;Hypertension Weight Management/Obesity;Hypertension     Review Spoke to patient about their shortness of breath and what they can do to improve. Patient has been informed of breathing techniques when starting the program. Patient is informed to tell staff if they have had any med changes and that certain meds they are taking or not taking can be causing shortness of breath. Spoke with  patient about their shortness of breath and she is seeing her pulmonologist today about her shortness of breath. She was also reminded of the breathing techniques when starting the program. Patient is not monitoring her blood pressure at home with her cuff at home. She was informed to start checking at home. Patient states that she is still trying to lose some weight but with th eholidays it is more difficult. She is still working on maintaining her diet, and trying to implement home exercise. Patient also states that she knows that she needs to be taking her blood pressure, but is currently not. She has been informed on why taking her blood pressure is import, and has been encouraged to take it at least once a day on non-program days.     Expected Outcomes Short: Attend [facility] regularly to improve shortness of breath with ADL's. Long: maintain independence with ADL's Short: Attend [facility] regularly to improve shortness of breath with ADL's and check blood pressure at home. Long: maintain independence with ADL's and independence with monitoring blood pressure at home. Short: Attend [facility] regularly to improve shortness of breath with ADL's and check blood pressure at home, and continue to practice good habits in order to lose weight. Long: maintain independence with ADL's and independence with monitoring blood pressure at home.              Core Components/Risk Factors/Patient Goals at Discharge (Final Review):   Goals and Risk Factor Review - 05/02/23 0807       Core Components/Risk Factors/Patient Goals Review   Personal Goals Review Weight Management/Obesity;Hypertension    Review Patient states  that she is still trying to lose some weight but with th eholidays it is more difficult. She is still working on maintaining her diet, and trying to implement home exercise. Patient also states that she knows that she needs to be taking her blood pressure, but is currently not. She has been informed  on why taking her blood pressure is import, and has been encouraged to take it at least once a day on non-program days.    Expected Outcomes Short: Attend [facility] regularly to improve shortness of breath with ADL's and check blood pressure at home, and continue to practice good habits in order to lose weight. Long: maintain independence with ADL's and independence with monitoring blood pressure at home.             ITP Comments:  ITP Comments     Row Name 01/25/23 1418 02/13/23 1638 02/15/23 1214 02/16/23 0811 03/15/23 1304   ITP Comments Virtual Visit completed. Patient informed on EP and RD appointment and 6 Minute walk test. Patient also informed of patient health questionnaires on My Chart. Patient Verbalizes understanding. Visit diagnosis can be found in [facility]/03/[facility]. Completed and gym orientation. Initial ITP created and sent for review to Dr. Jinny Sanders, Medical Director. 30 Day review completed. Medical Director ITP review done, changes made as directed, and signed approval by Medical Director.   new to program First full day of exercise!  Patient was oriented to gym and equipment including functions, settings, policies, and procedures.  Patient's individual exercise prescription and treatment plan were reviewed.  All starting workloads were established based on the results of the 6 minute walk test done at initial orientation visit.  The plan for exercise progression was also introduced and progression will be customized based on patient's performance and goals. 30 Day review completed. Medical Director ITP review done, changes made as directed, and signed approval by Medical Director.    new to program    Row Name 04/05/23 1153 05/03/23 1014         ITP Comments 30 Day review completed. Medical Director ITP review done, changes made as directed, and signed approval by Medical Director. 30 Day review completed. Medical Director ITP review done, changes made as directed, and  signed approval by Medical Director.               Comments:

## 2023-05-04 ENCOUNTER — Encounter: Payer: Medicare HMO | Admitting: *Deleted

## 2023-05-04 DIAGNOSIS — I5032 Chronic diastolic (congestive) heart failure: Secondary | ICD-10-CM | POA: Diagnosis not present

## 2023-05-04 NOTE — Progress Notes (Signed)
Daily Session Note  Patient Details  Name: Lauren Lloyd MRN: [id number] Date of Birth: 10-15-1940 Referring Provider:   Flowsheet Row Pulmonary Rehab from 02/13/2023 in [facility]  Referring Provider Julien Nordmann, MD       Encounter Date: 05/04/2023  Check In:  Session Check In - 05/04/23 0737       Check-In   Supervising physician immediately available to respond to emergencies See telemetry face sheet for immediately available ER MD    Location [facility]-Cardiac & Pulmonary Rehab    Staff Present Ronette Deter, BS, Exercise Physiologist;Joseph Clinchport, RCP,RRT,BSRT;Maxon Galatia BS, , Exercise Physiologist;Raffael Bugarin Katrinka Blazing, RN, [place]    Virtual Visit No    Medication changes reported     No    Fall or balance concerns reported    No    Warm-up and Cool-down Performed on first and last piece of equipment    Resistance Training Performed Yes    VAD Patient? No    PAD/SET Patient? No      Pain Assessment   Currently in Pain? No/denies                Social History   Tobacco Use  Smoking Status Former   Current packs/day: 0.00   Average packs/day: 1 pack/day for 30.0 years (30.0 ttl pk-yrs)   Types: Cigarettes   Start date: 05/17/1959   Quit date: 05/16/1989   Years since quitting: 33.9   Passive exposure: Past  Smokeless Tobacco Never    Goals Met:  Independence with exercise equipment Exercise tolerated well No report of concerns or symptoms today Strength training completed today  Goals Unmet:  Not Applicable  Comments: Pt able to follow exercise prescription today without complaint.  Will continue to monitor for progression.    Dr. Bethann Punches is Medical Director for [facility] Cardiac Rehabilitation.  Dr. Vida Rigger is Medical Director for [facility] Pulmonary Rehabilitation.

## 2023-05-22 ENCOUNTER — Ambulatory Visit: Payer: Self-pay | Admitting: *Deleted

## 2023-05-22 NOTE — Telephone Encounter (Signed)
 I think she needs to be seen/evaluated. Are there any openings we can offer her (will need to see if she will see another provider if I don't have anything). Can someone drive her if she is not comfortable driving?

## 2023-05-22 NOTE — Telephone Encounter (Signed)
  Chief Complaint: dizziness  Symptoms: dizziness started last week and had to hold something to walk on Sunday . Monday started feeling better. Dizziness returned yesterday not as bad worse with sitting and standing. Feels woozy. Did not check BP due to cuff upstairs. Hx heart issues and can not drink extra fluids.  Frequency: last week Pertinent Negatives: Patient denies chest pain no difficulty breathing no fever no other sx reported  Disposition: [] ED /[] Urgent Care (no appt availability in office) / [] Appointment(In office/virtual)/ []  [place] [facility]/ [] Home Care/ [x] Refused Recommended Disposition /[] [place] [facility]/ []  Follow-up with PCP Additional Notes:   Offered appt today with other provider than PCP. Patient would like to see PCP. Pt afraid to drive today due to episodes of dizziness. Patient requesting a call back when she can see PCP. Please advise.       Reason for Disposition  [1] MODERATE dizziness (e.g., interferes with normal activities) AND [2] has NOT been evaluated by doctor (or NP/PA) for this  (Exception: Dizziness caused by heat exposure, sudden standing, or poor fluid intake.)  Answer Assessment - Initial Assessment Questions 1. DESCRIPTION: Describe your dizziness.     lightheaded 2. LIGHTHEADED: Do you feel lightheaded? (e.g., somewhat faint, woozy, weak upon standing)     Woozy when sitting or standing  3. VERTIGO: Do you feel like either you or the room is spinning or tilting? (i.e. vertigo)     na 4. SEVERITY: How bad is it?  Do you feel like you are going to faint? Can you stand and walk?   - MILD: Feels slightly dizzy, but walking normally.   - MODERATE: Feels unsteady when walking, but not falling; interferes with normal activities (e.g., school, work).   - SEVERE: Unable to walk without falling, or requires assistance to walk without falling; feels like passing out now.      Unsteady today  5. ONSET:  When did the dizziness  begin?     Started last week Sunday and better this week and restarted yesterday  6. AGGRAVATING FACTORS: Does anything make it worse? (e.g., standing, change in head position)     Standing looking down makes worse 7. HEART RATE: Can you tell me your heart rate? How many beats in 15 seconds?  (Note: not all patients can do this)       na 8. CAUSE: What do you think is causing the dizziness?     Possible supplement started  9. RECURRENT SYMPTOM: Have you had dizziness before? If Yes, ask: When was the last time? What happened that time?     no 10. OTHER SYMPTOMS: Do you have any other symptoms? (e.g., fever, chest pain, vomiting, diarrhea, bleeding)       Always SOB not new. Dizziness with standing  11. PREGNANCY: Is there any chance you are pregnant? When was your last menstrual period?       na  Protocols used: Dizziness - Lightheadedness-A-AH

## 2023-05-23 NOTE — Telephone Encounter (Signed)
 Patient called for response and notified of provider recommendation. Patient has been scheduled for 05/23/22- afternoon appointment with alternate provider. Patient can come today if there is an opening.

## 2023-05-24 ENCOUNTER — Encounter: Payer: Self-pay | Admitting: Physician Assistant

## 2023-05-24 ENCOUNTER — Ambulatory Visit (INDEPENDENT_AMBULATORY_CARE_PROVIDER_SITE_OTHER): Payer: Medicare Other | Admitting: Physician Assistant

## 2023-05-24 VITALS — BP 130/64 | HR 70 | Resp 16 | Wt 220.7 lb

## 2023-05-24 DIAGNOSIS — I5032 Chronic diastolic (congestive) heart failure: Secondary | ICD-10-CM

## 2023-05-24 DIAGNOSIS — R42 Dizziness and giddiness: Secondary | ICD-10-CM

## 2023-05-24 DIAGNOSIS — G4733 Obstructive sleep apnea (adult) (pediatric): Secondary | ICD-10-CM

## 2023-05-24 DIAGNOSIS — R0981 Nasal congestion: Secondary | ICD-10-CM | POA: Diagnosis not present

## 2023-05-24 DIAGNOSIS — K219 Gastro-esophageal reflux disease without esophagitis: Secondary | ICD-10-CM | POA: Diagnosis not present

## 2023-05-24 DIAGNOSIS — R1310 Dysphagia, unspecified: Secondary | ICD-10-CM

## 2023-05-24 MED ORDER — FLUTICASONE PROPIONATE 50 MCG/ACT NA SUSP: 2.0000 | Freq: Every day | NASAL | 6 refills | Status: DC

## 2023-05-24 NOTE — Progress Notes (Signed)
 Established patient visit  Patient: Lauren Lloyd   DOB: [birth year]   [name]  MRN: [id number] Visit Date: 05/24/2023  Today's healthcare provider: Jolynn Spencer, PA-C   Chief Complaint  Patient presents with   Dizziness   Subjective       Discussed the use of AI scribe software for clinical note transcription with the patient, who gave verbal consent to proceed.  History of Present Illness   The patient, with a past medical history of chronic heart failure, hypertension, and obstructive sleep apnea, presents with intermittent dizziness. The dizziness is described as positional, worsening with changes such as bending down or transitioning from sitting to standing. The patient reports two significant episodes of dizziness, one occurring a week prior and another occurring the previous Sunday. The patient also reports a history of shortness of breath and a lack of energy, which she attributes to her chronic heart failure and atrial fibrillation. The patient has not Lloyd using her CPAP machine for the past couple of weeks due to travel. The patient also reports a chronic cough, especially in the mornings, and difficulty swallowing, which she has noticed has Lloyd not getting worse. The patient has a history of smoking but quit 33 years ago.           05/24/2023    1:51 PM 03/16/2023    1:13 PM 02/16/2023    7:59 AM  Depression screen PHQ 2/9  Decreased Interest 0 0 0  Down, Depressed, Hopeless 0 0 0  PHQ - 2 Score 0 0 0  Altered sleeping  1 0  Tired, decreased energy  1 1  Change in appetite  1 1  Feeling bad or failure about yourself   0 0  Trouble concentrating  0 0  Moving slowly or fidgety/restless  0 0  Suicidal thoughts  0 0  PHQ-9 Score  3 2  Difficult doing work/chores  Not difficult at all Not difficult at all      05/24/2023    1:51 PM  GAD 7 : Generalized Anxiety Score  Nervous, Anxious, on Edge 0  Control/stop worrying 0  Worry too much - different things 0   Trouble relaxing 0  Restless 0  Easily annoyed or irritable 1  Afraid - awful might happen 0  Total GAD 7 Score 1  Anxiety Difficulty Not difficult at all    Medications: Outpatient Medications Prior to Visit  Medication Sig   acetaminophen  (TYLENOL ) 325 MG tablet Take 650 mg by mouth every 6 (six) hours as needed for moderate pain.   allopurinol  (ZYLOPRIM ) 100 MG tablet TAKE ONE TABLET BY MOUTH EVERY DAY   bisoprolol  (ZEBETA ) 10 MG tablet Take 1 tablet (10 mg total) by mouth 2 (two) times daily.   calcium  carbonate (TUMS EX) 750 MG chewable tablet Chew 2 tablets by mouth daily as needed for heartburn.   Cholecalciferol  25 MCG (1000 UT) tablet Take 1,000 Units by mouth daily.   ELIQUIS  5 MG TABS tablet TAKE ONE TABLET TWICE DAILY   ezetimibe  (ZETIA ) 10 MG tablet TAKE 1 TABLET BY MOUTH DAILY   furosemide  (LASIX ) 40 MG tablet TAKE 1 TABLET BY MOUTH DAILY. TAKE AN EXTRA TABLET AS NEEDED AFTER LUNCH FOR ABDOMINAL SWELLING, LEG SWELLING OR SHORTNESS OF BREATH   gabapentin  (NEURONTIN ) 600 MG tablet TAKE ONE TABLET BY MOUTH AT BEDTIME   Magnesium  300 MG CAPS Take by mouth.   Menthol, Topical Analgesic, (BIOFREEZE EX) Apply 1 application. topically daily as needed (  Neck pain).   metaxalone  (SKELAXIN ) 800 MG tablet Take 1 tablet (800 mg total) by mouth daily as needed for muscle spasms.   Multiple Vitamin (MULTIVITAMIN) capsule Take 1 capsule by mouth daily.   mupirocin  ointment (BACTROBAN ) 2 % Place 1 application  into the nose 2 (two) times daily.   potassium chloride  (KLOR-CON ) 10 MEQ tablet TAKE 1 TABLET BY MOUTH DAILY   Tiotropium Bromide -Olodaterol (STIOLTO RESPIMAT ) 2.5-2.5 MCG/ACT AERS Inhale 2 puffs into the lungs daily.   levalbuterol  (XOPENEX  HFA) 45 MCG/ACT inhaler Inhale 2 puffs into the lungs every 8 (eight) hours as needed for wheezing.   naltrexone  (DEPADE) 50 MG tablet TAKE 1/2 TABLET BY MOUTH DAILY   No facility-administered medications prior to visit.    Review of  Systems All negative Except see HPI       Objective    BP 130/64 (BP Location: Left Arm, Patient Position: Sitting, Cuff Size: Large)   Pulse 70   Resp 16   Wt 220 lb 11.2 oz (100.1 kg)   BMI 34.06 kg/m   Orthostatic VS for the past 24 hrs:  BP- Lying Pulse- Lying BP- Sitting Pulse- Sitting BP- Standing at 0 minutes Pulse- Standing at 0 minutes  05/24/23 1354 129/65 69 127/60 69 109/60 70      Physical Exam Vitals reviewed.  Constitutional:      General: She is not in acute distress.    Appearance: Normal appearance. She is well-developed. She is not diaphoretic.  HENT:     Head: Normocephalic and atraumatic.     Right Ear: Ear canal and external ear normal. There is impacted cerumen (partially).     Left Ear: Ear canal and external ear normal.     Nose: Congestion and rhinorrhea present.     Mouth/Throat:     Comments: Postnasal drainage noted Eyes:     General: No scleral icterus.       Right eye: No discharge.     Extraocular Movements: Extraocular movements intact.     Conjunctiva/sclera: Conjunctivae normal.     Pupils: Pupils are equal, round, and reactive to light.  Neck:     Thyroid : No thyromegaly.  Cardiovascular:     Rate and Rhythm: Normal rate and regular rhythm.     Pulses: Normal pulses.     Heart sounds: Normal heart sounds. No murmur heard. Pulmonary:     Effort: Pulmonary effort is normal. No respiratory distress.     Breath sounds: Normal breath sounds. No wheezing, rhonchi or rales.  Musculoskeletal:     Cervical back: Neck supple.     Right lower leg: No edema.     Left lower leg: No edema.  Lymphadenopathy:     Cervical: No cervical adenopathy.  Skin:    General: Skin is warm and dry.     Findings: No rash.  Neurological:     Mental Status: She is alert and oriented to person, place, and time. Mental status is at baseline.  Psychiatric:        Mood and Affect: Mood normal.        Behavior: Behavior normal.      No results found  for any visits on 05/24/23.      Assessment and Plan    Episodic Dizziness Patient reports episodes of dizziness, particularly when changing positions. No current dizziness. No hearing loss. Possible connection to orthostatic hypotension vs dehydration or sinus congestion or BPPV or cardiovascular reason based on complicated cv history -Order basic  blood work to rule out electrolyte imbalances or other systemic causes. -Advise patient to maintain adequate hydration and salt intake. -Prescribe antihistamine for potential sinus congestion. -Recommend use of Flonase  and saline nasal spray for sinus congestion. Check troponin level and d dimer to rule out cardiovascular event. Will reassess after receiving lab results. Declined EKG or other imaging Advised compression garments and  Physical maneuvers like crossing legs to manage symptoms  In the setting of  Chronic Heart Failure Patient reports shortness of breath and lack of energy. No acute exacerbation. -Continue current management plan. With restriction of water  intake, diuretics  Obstructive Sleep Apnea Patient reports not using CPAP machine consistently due to travel. Dizziness could be due to OSA. -Encourage consistent use of CPAP machine As managing sleep apnea can cause improvement of dizziness  Gastroesophageal Reflux Disease Patient reports occasional heartburn, managed with Tums. Per some studies, episode of dizziness could be attributed to GERD -Continue current management plan.  Swallowing Difficulty Patient reports increasing difficulty swallowing, could be due to GERD. -Consider further evaluation if symptoms persist or worsen.      Orders Placed This Encounter  Procedures   CBC with Differential/Platelet   Comprehensive metabolic panel   D-Dimer, Quantitative   Troponin T    No follow-ups on file.   The patient was advised to call back or seek an in-person evaluation if the symptoms worsen or if the condition  fails to improve as anticipated.  I discussed the assessment and treatment plan with the patient. The patient was provided an opportunity to ask questions and all were answered. The patient agreed with the plan and demonstrated an understanding of the instructions.  I, Elery Cadenhead, PA-C have reviewed all documentation for this visit. The documentation on 05/24/2023  for the exam, diagnosis, procedures, and orders are all accurate and complete.  Jolynn Spencer, [facility], MMS [facility] [phone] (phone) [phone] (fax)  [facility]

## 2023-05-25 ENCOUNTER — Encounter: Payer: Medicare Other | Attending: Cardiovascular Disease | Admitting: *Deleted

## 2023-05-25 ENCOUNTER — Telehealth: Payer: Self-pay | Admitting: Family Medicine

## 2023-05-25 DIAGNOSIS — I5032 Chronic diastolic (congestive) heart failure: Secondary | ICD-10-CM | POA: Insufficient documentation

## 2023-05-25 DIAGNOSIS — R42 Dizziness and giddiness: Secondary | ICD-10-CM | POA: Insufficient documentation

## 2023-05-25 DIAGNOSIS — R0981 Nasal congestion: Secondary | ICD-10-CM | POA: Insufficient documentation

## 2023-05-25 LAB — CBC WITH DIFFERENTIAL/PLATELET
Basophils Absolute: 0.1 10*3/uL (ref 0.0–0.2)
Basos: 1 %
EOS (ABSOLUTE): 0.2 10*3/uL (ref 0.0–0.4)
Eos: 2 %
Hematocrit: 41.1 % (ref 34.0–46.6)
Hemoglobin: 13.6 g/dL (ref 11.1–15.9)
Immature Grans (Abs): 0 10*3/uL (ref 0.0–0.1)
Immature Granulocytes: 0 %
Lymphocytes Absolute: 2.2 10*3/uL (ref 0.7–3.1)
Lymphs: 23 %
MCH: 31.1 pg (ref 26.6–33.0)
MCHC: 33.1 g/dL (ref 31.5–35.7)
MCV: 94 fL (ref 79–97)
Monocytes Absolute: 1 10*3/uL — ABNORMAL HIGH (ref 0.1–0.9)
Monocytes: 11 %
Neutrophils Absolute: 6 10*3/uL (ref 1.4–7.0)
Neutrophils: 63 %
Platelets: 233 10*3/uL (ref 150–450)
RBC: 4.37 x10E6/uL (ref 3.77–5.28)
RDW: 12.1 % (ref 11.7–15.4)
WBC: 9.5 10*3/uL (ref 3.4–10.8)

## 2023-05-25 LAB — COMPREHENSIVE METABOLIC PANEL
ALT: 22 [IU]/L (ref 0–32)
AST: 25 [IU]/L (ref 0–40)
Albumin: 4.1 g/dL (ref 3.7–4.7)
Alkaline Phosphatase: 154 [IU]/L — ABNORMAL HIGH (ref 44–121)
BUN/Creatinine Ratio: 29 — ABNORMAL HIGH (ref 12–28)
BUN: 28 mg/dL — ABNORMAL HIGH (ref 8–27)
Bilirubin Total: 0.6 mg/dL (ref 0.0–1.2)
CO2: 23 mmol/L (ref 20–29)
Calcium: 9.8 mg/dL (ref 8.7–10.3)
Chloride: 104 mmol/L (ref 96–106)
Creatinine, Ser: 0.96 mg/dL (ref 0.57–1.00)
Globulin, Total: 2.9 g/dL (ref 1.5–4.5)
Glucose: 115 mg/dL — ABNORMAL HIGH (ref 70–99)
Potassium: 4.2 mmol/L (ref 3.5–5.2)
Sodium: 144 mmol/L (ref 134–144)
Total Protein: 7 g/dL (ref 6.0–8.5)
eGFR: 59 mL/min/{1.73_m2} — ABNORMAL LOW (ref >59)

## 2023-05-25 LAB — TROPONIN T: Troponin T (Highly Sensitive): 16 ng/L (ref 0–14)

## 2023-05-25 LAB — D-DIMER, QUANTITATIVE: D-DIMER: 0.78 mg{FEU}/L — ABNORMAL HIGH (ref 0.00–0.49)

## 2023-05-25 NOTE — Progress Notes (Signed)
 Advised pt to go to ER due to high troponin level, a marker for heart attack. Also, she has decreased kidney function, could be due to dehydration.

## 2023-05-25 NOTE — Telephone Encounter (Signed)
 Pt returned call about lab results, no result notes in system. Please cb

## 2023-05-26 ENCOUNTER — Other Ambulatory Visit: Payer: Self-pay

## 2023-05-26 ENCOUNTER — Emergency Department
Admission: EM | Admit: 2023-05-26 | Discharge: 2023-05-26 | Disposition: A | Discharge status: Home / Self Care | Payer: Medicare Other | Attending: Emergency Medicine | Admitting: Emergency Medicine

## 2023-05-26 ENCOUNTER — Emergency Department: Payer: Medicare Other

## 2023-05-26 ENCOUNTER — Telehealth: Payer: Self-pay | Admitting: Cardiovascular Disease

## 2023-05-26 DIAGNOSIS — R799 Abnormal finding of blood chemistry, unspecified: Secondary | ICD-10-CM | POA: Diagnosis not present

## 2023-05-26 DIAGNOSIS — N189 Chronic kidney disease, unspecified: Secondary | ICD-10-CM | POA: Insufficient documentation

## 2023-05-26 DIAGNOSIS — I130 Hypertensive heart and chronic kidney disease with heart failure and stage 1 through stage 4 chronic kidney disease, or unspecified chronic kidney disease: Secondary | ICD-10-CM | POA: Insufficient documentation

## 2023-05-26 DIAGNOSIS — R0602 Shortness of breath: Secondary | ICD-10-CM | POA: Diagnosis not present

## 2023-05-26 DIAGNOSIS — J939 Pneumothorax, unspecified: Secondary | ICD-10-CM | POA: Diagnosis not present

## 2023-05-26 DIAGNOSIS — I517 Cardiomegaly: Secondary | ICD-10-CM | POA: Diagnosis not present

## 2023-05-26 DIAGNOSIS — R0989 Other specified symptoms and signs involving the circulatory and respiratory systems: Secondary | ICD-10-CM | POA: Diagnosis not present

## 2023-05-26 DIAGNOSIS — I110 Hypertensive heart disease with heart failure: Secondary | ICD-10-CM | POA: Diagnosis not present

## 2023-05-26 DIAGNOSIS — I5033 Acute on chronic diastolic (congestive) heart failure: Secondary | ICD-10-CM | POA: Diagnosis not present

## 2023-05-26 DIAGNOSIS — Z7901 Long term (current) use of anticoagulants: Secondary | ICD-10-CM | POA: Insufficient documentation

## 2023-05-26 DIAGNOSIS — I509 Heart failure, unspecified: Secondary | ICD-10-CM | POA: Diagnosis not present

## 2023-05-26 LAB — CBC
HCT: 40.8 % (ref 36.0–46.0)
Hemoglobin: 13.5 g/dL (ref 12.0–15.0)
MCH: 31.8 pg (ref 26.0–34.0)
MCHC: 33.1 g/dL (ref 30.0–36.0)
MCV: 96 fL (ref 80.0–100.0)
Platelets: 217 10*3/uL (ref 150–400)
RBC: 4.25 MIL/uL (ref 3.87–5.11)
RDW: 13.2 % (ref 11.5–15.5)
WBC: 8 10*3/uL (ref 4.0–10.5)
nRBC: 0 % (ref 0.0–0.2)

## 2023-05-26 LAB — TROPONIN I (HIGH SENSITIVITY)
Troponin I (High Sensitivity): 9 ng/L (ref <18)
Troponin I (High Sensitivity): 10 ng/L (ref <18)

## 2023-05-26 LAB — BASIC METABOLIC PANEL
Anion gap: 10 (ref 5–15)
BUN: 25 mg/dL — ABNORMAL HIGH (ref 8–23)
CO2: 26 mmol/L (ref 22–32)
Calcium: 8.8 mg/dL — ABNORMAL LOW (ref 8.9–10.3)
Chloride: 105 mmol/L (ref 98–111)
Creatinine, Ser: 1.04 mg/dL — ABNORMAL HIGH (ref 0.44–1.00)
GFR, Estimated: 53 mL/min — ABNORMAL LOW (ref >60)
Glucose, Bld: 103 mg/dL — ABNORMAL HIGH (ref 70–99)
Potassium: 3.5 mmol/L (ref 3.5–5.1)
Sodium: 141 mmol/L (ref 135–145)

## 2023-05-26 MED ORDER — FUROSEMIDE 40 MG PO TABS
40.0000 mg | ORAL_TABLET | Freq: Once | ORAL | Status: AC
  Administered 2023-05-26: 40 mg via ORAL
  Filled 2023-05-26: qty 1

## 2023-05-26 NOTE — ED Notes (Signed)
 This RN to bedside to introduce self to pt. Pt resting and in no acute distress.

## 2023-05-26 NOTE — ED Triage Notes (Signed)
 Pt to ED for abnormal labs. Chart shows elevated troponin and BUN. Pt reports initially went to doctor for dizziness. Denies chest pain. Denies complaints currently. Reports chronic shob.

## 2023-05-26 NOTE — Telephone Encounter (Signed)
 Please review

## 2023-05-26 NOTE — ED Provider Notes (Signed)
 [facility] Provider Note    Event Date/Time   First MD Initiated Contact with Patient 05/26/23 1511     (approximate)   History   Chief Complaint abnormal labs   HPI  Lauren Lloyd is a 83 y.o. female with past medical history of hypertension, atrial fibrillation on Eliquis , CKD, diastolic CHF, and chronic pain syndrome who presents to the ED for abnormal labs.  Patient reports that she was seen a couple of days ago by her PCP for some lightheadedness when standing, had labs drawn and was told by her PCP that her troponin was was mildly elevated along with her BUN.  Following this finding, she was referred to the ED for further evaluation.  She states that she has minimal dizziness or lightheadedness at this time, does have chronic difficulty breathing that is unchanged today.  She denies any recent fever or cough, has had some intermittent sharp pain over her left lateral chest wall for the past 2 weeks but denies any chest pain currently.     Physical Exam   Triage Vital Signs: ED Triage Vitals [05/26/23 1429]  Encounter Vitals Group     BP (!) 159/60     Systolic BP Percentile      Diastolic BP Percentile      Pulse Rate 69     Resp 20     Temp 98 F (36.7 C)     Temp src      SpO2 97 %     Weight 220 lb 7.4 oz (100 kg)     Height 5' 7 (1.702 m)     Head Circumference      Peak Flow      Pain Score 0     Pain Loc      Pain Education      Exclude from Growth Chart     Most recent vital signs: Vitals:   05/26/23 1429  BP: (!) 159/60  Pulse: 69  Resp: 20  Temp: 98 F (36.7 C)  SpO2: 97%    Constitutional: Alert and oriented. Eyes: Conjunctivae are normal. Head: Atraumatic. Nose: No congestion/rhinnorhea. Mouth/Throat: Mucous membranes are moist.  Cardiovascular: Normal rate, regular rhythm. Grossly normal heart sounds.  2+ radial pulses bilaterally. Respiratory: Normal respiratory effort.  No retractions. Lungs CTAB.  No chest  wall tenderness to palpation. Gastrointestinal: Soft and nontender. No distention. Musculoskeletal: No lower extremity tenderness, trace pitting edema to knees bilaterally. Neurologic:  Normal speech and language. No gross focal neurologic deficits are appreciated.    ED Results / Procedures / Treatments   Labs (all labs ordered are listed, but only abnormal results are displayed) Labs Reviewed  BASIC METABOLIC PANEL - Abnormal; Notable for the following components:      Result Value   Glucose, Bld 103 (*)    BUN 25 (*)    Creatinine, Ser 1.04 (*)    Calcium  8.8 (*)    GFR, Estimated 53 (*)    All other components within normal limits  CBC  TROPONIN I (HIGH SENSITIVITY)  TROPONIN I (HIGH SENSITIVITY)     EKG  ED ECG REPORT I, Carlin Palin, the attending physician, personally viewed and interpreted this ECG.   Date: 05/26/2023  EKG Time: 14:32  Rate: 70  Rhythm: Ventricular paced rhythm  Axis: LAD  Intervals:nonspecific intraventricular conduction delay  ST&T Change: None  RADIOLOGY Chest x-ray reviewed and interpreted by me with cardiomegaly and pulmonary vascular congestion, no focal infiltrate.  PROCEDURES:  Critical Care performed: No  Procedures   MEDICATIONS ORDERED IN ED: Medications  furosemide  (LASIX ) tablet 40 mg (40 mg Oral Given 05/26/23 1636)     IMPRESSION / MDM / ASSESSMENT AND PLAN / ED COURSE  I reviewed the triage vital signs and the nursing notes.                              83 y.o. female with past medical history of hypertension, atrial fibrillation on Eliquis , diastolic CHF, CKD, and chronic pain syndrome who presents to the ED complaining of abnormal labs with elevated troponin and BUN as an outpatient.  Patient's presentation is most consistent with acute presentation with potential threat to life or bodily function.  Differential diagnosis includes, but is not limited to, ACS, arrhythmia, CHF exacerbation, AKI, electrolyte  abnormality.  Patient nontoxic-appearing and in no acute distress, vital signs are unremarkable.  EKG shows no evidence of arrhythmia or ischemia, while troponin was mildly elevated 2 days ago, it is within normal limits today.  Troponin drawn previously was a troponin T, which is less specific compared to the troponin I that was drawn today.  We will trend her troponin but very low suspicion for cardiac event at this time given minimal symptoms, described discomfort to her left lateral chest wall is very atypical for ACS.  Chest x-ray does show some pulmonary vascular congestion, and CHF could be contributing to mild troponin leak.  We will give extra dose of her oral Lasix , would hold off on aggressive diuresis given her CKD, which appears stable today.  No significant anemia, leukocytosis, or electrolyte abnormality noted.  Repeat troponin is stable, patient remains chest pain-free here in the ED and is appropriate for discharge home with outpatient follow-up.  Referrals provided back to her cardiologist as well as the heart failure clinic.  She was counseled to return to the ED for new or worsening symptoms, patient agrees with plan.      FINAL CLINICAL IMPRESSION(S) / ED DIAGNOSES   Final diagnoses:  Acute on chronic diastolic congestive heart failure (HCC)     Rx / DC Orders   ED Discharge Orders          Ordered    AMB referral to CHF clinic        05/26/23 1810    Ambulatory referral to Cardiology        05/26/23 1810             Note:  This document was prepared using Dragon voice recognition software and may include unintentional dictation errors.   Willo Dunnings, MD 05/26/23 [phone]

## 2023-05-26 NOTE — Telephone Encounter (Signed)
 Pt wants someone to look at her labs and call her back asap to let her know if she really needs to go to the ER.

## 2023-05-26 NOTE — Telephone Encounter (Signed)
 Spoke with ED Charge nurse and advised patient is en route to ED per PCP direction due to troponin was 16. She had no further questions.

## 2023-05-26 NOTE — Telephone Encounter (Signed)
 Spoke with patient and reviewed that her PCP did instruct her to proceed to ED and that is what she should do. She verbalized understanding and will proceed to ED.

## 2023-05-26 NOTE — ED Notes (Signed)
 This RN to bedside to DC patient and update vitals. Pt had already left the building.

## 2023-05-29 ENCOUNTER — Other Ambulatory Visit: Payer: Self-pay | Admitting: Cardiovascular Disease

## 2023-05-29 ENCOUNTER — Ambulatory Visit: Payer: Medicare Other | Attending: Cardiovascular Disease | Admitting: Cardiovascular Disease

## 2023-05-29 ENCOUNTER — Encounter: Payer: Self-pay | Admitting: Cardiovascular Disease

## 2023-05-29 ENCOUNTER — Telehealth: Payer: Self-pay

## 2023-05-29 VITALS — BP 160/80 | HR 73 | Ht 67.5 in | Wt 219.4 lb

## 2023-05-29 DIAGNOSIS — R0600 Dyspnea, unspecified: Secondary | ICD-10-CM | POA: Diagnosis not present

## 2023-05-29 DIAGNOSIS — I4821 Permanent atrial fibrillation: Secondary | ICD-10-CM

## 2023-05-29 DIAGNOSIS — I495 Sick sinus syndrome: Secondary | ICD-10-CM

## 2023-05-29 DIAGNOSIS — I10 Essential (primary) hypertension: Secondary | ICD-10-CM

## 2023-05-29 DIAGNOSIS — I5032 Chronic diastolic (congestive) heart failure: Secondary | ICD-10-CM

## 2023-05-29 MED ORDER — MECLIZINE HCL 25 MG PO TABS: 25.0000 mg | ORAL_TABLET | Freq: Three times a day (TID) | ORAL | 1 refills | Status: DC | PRN

## 2023-05-29 NOTE — Transitions of Care (Post Inpatient/ED Visit) (Signed)
   05/29/2023  Name: Lauren Lloyd MRN: [id number] DOB: Oct 20, 1940  Today's TOC FU Call Status: Today's TOC FU Call Status:: Unsuccessful Call (1st Attempt) Unsuccessful Call (1st Attempt) Date: 05/29/23  Attempted to reach the patient regarding the most recent Inpatient/ED visit.  Follow Up Plan: Additional outreach attempts will be made to reach the patient to complete the Transitions of Care (Post Inpatient/ED visit) call.   Signature Julian Lemmings, LPN [facility] Nurse Health Advisor Direct Dial [phone]

## 2023-05-29 NOTE — Progress Notes (Signed)
 Date:  05/29/2023   ID:  [name], [name] 09-Mar-1941, MRN [id number]  Patient Location:  [address] [place] [address]   Provider location:   [facility] [place] [facility]  PCP:  Myrla Jon HERO, MD  Cardiologist:  Perla MOCCASIN [facility]  Chief Complaint  Patient presents with   [facility] follow up; CHF     Patient c/o shortness of breath with little exertion.     History of Present Illness:    Lauren Lloyd is a 83 y.o. female  past medical history of Aortic atherosclerosis on CT scan 2020 hypertension,  asthma  Chronic lower extremity swelling Hospital admission for atrial fibrillation December 2019 Chronic diastolic CHF Status post successful DCCV on 06/15/2018.  NSR on 06/2018 stress test, 07/2018 Showing no significant ischemia Mild lower extremity swelling, chronic atrial fibrillation started June 2022 after her knee surgery Now permanent atrial fibrillation pacemaker was implanted October 25, 2021 and she had an AV nodal ablation on July 05, 2022.  Who presents for follow-up of her permanent atrial fibrillation and acute on chronic diastolic CHF  Last seen by myself in clinic August 2024 pacemaker was implanted October 25, 2021  AV nodal ablation on July 05, 2022.   In follow-up today she reports that she is not sleeping well No regular activity, exercise Chronic fatigue  Has not been checking blood pressure at home Elevated today but has been well-controlled recently 130/60 with primary care  Minimal lower extremity edema, takes Lasix  40 daily Rarely takes extra Lasix   EKGs showing underlying atrial fibrillation, paced rhythm History of sleep apnea, compliant with her CPAP  EKG personally reviewed by myself on todays visit EKG Interpretation Date/Time:  Monday May 29 2023 16:46:21 EST Ventricular Rate:  73 PR Interval:    QRS Duration:  154 QT Interval:  466 QTC Calculation: 513 R Axis:   -62  Text  Interpretation: Ventricular-paced rhythm When compared with ECG of 26-May-2023 14:32, Vent. rate has increased BY   3 BPM Confirmed by Tameron Lama [phone]) on 05/29/2023 5:02:38 PM   On Eliquis  for stroke prophylaxis. Post AV nodal ablation.   Previously tried amiodarone , Tikosyn  Prior cardioversions  [employer] PPM implanted 10/25/2021 for tachy brady  Chronic shortness of breath on exertion Multifactorial, ventricularly paced,   asthma versus chronic bronchitis Sedentary at baseline, deconditioned  Echocardiogram May 2024 for shortness of breath EF 60 to 65%  EKG personally reviewed by myself on todays visit EKG Interpretation Date/Time:  Monday May 29 2023 16:46:21 EST Ventricular Rate:  73 PR Interval:    QRS Duration:  154 QT Interval:  466 QTC Calculation: 513 R Axis:   -62  Text Interpretation: Ventricular-paced rhythm When compared with ECG of 26-May-2023 14:32, Vent. rate has increased BY   3 BPM Confirmed by Perla Lye [phone]) on 05/29/2023 5:02:38 PM    Other past medical history reviewed History of back pain, has received cortisone injections Followed by Dr. Avanell   Nov 2020, developed atrial fib  stress test, 07/2018 Showing no significant ischemia normal ejection fraction normal perfusion  Presented to the hospital 04/2018 with worsening shortness of breath, leg swelling, weight gain, abdominal bloating and new atrial fibrillation with RVR Initially on Cardizem  infusion, changed to oral Cardizem  with titration upwards Discharged on Cardizem  CD 300 mg and metoprolol  25 mg bid, Eliquis . Treated with IV Lasix  during her hospital course with marked improvement in her shortness of breath   Carotid  ultrasound January 2020 Less than 39% disease bilaterally   Past Medical History:  Diagnosis Date   (HFpEF) heart failure with preserved ejection fraction (HCC)    a. 05/2018 Echo: EF 55-60%, no rwma, mild to mod MR. Nl RV fxn. Mod TR.  PASP .   Arthritis    knees, Hands   Arthritis of knee    Back pain    Carotid arterial disease (HCC)    a. 03/2019 Carotid U/S: <50% bilat ICA stenoses.   Cholelithiasis    a. 10/2018 noted on CT.   Edema, lower extremity    Fatty liver    GERD (gastroesophageal reflux disease)    History of stress test    a. 06/2018 MV: EF 59%, no ischemia/infarct. Low risk.   Knee pain    Lactose intolerance    Mitral regurgitation    a. 05/2018 Echo: mild to mod MR.   Multinodular goiter    Obesity    OSA (obstructive sleep apnea)    PAF (paroxysmal atrial fibrillation) (HCC)    a.  Diagnosed 12/19; b. 05/2018 s/p DCCV; c. 03/2019 & 05/2019 recurrent AFib-->managed w/ amio load; d. CHADS2VASc = 6 (CHF, HTN, age x 2, vascular disease, female)-->Eliquis  & amio 100 qd.   PAH (pulmonary artery hypertension) (HCC)    RSV (acute bronchiolitis due to respiratory syncytial virus) 05/10/2022   Scoliosis    SOB (shortness of breath)    Swallowing difficulty    Past Surgical History:  Procedure Laterality Date   AV NODE ABLATION N/A 07/05/2022   Procedure: AV NODE ABLATION;  Surgeon: Cindie Ole DASEN, MD;  [facility]: [facility];  Service: Cardiovascular;  Laterality: N/A;   CARDIOVERSION N/A 06/15/2018   Procedure: CARDIOVERSION (CATH LAB);  Surgeon: Perla Evalene PARAS, MD;  Location: [facility] ORS;  Service: Cardiovascular;  Laterality: N/A;   CARDIOVERSION N/A 02/18/2021   Procedure: CARDIOVERSION;  Surgeon: Perla Evalene PARAS, MD;  Location: [facility] ORS;  Service: Cardiovascular;  Laterality: N/A;   CATARACT EXTRACTION W/PHACO Right 01/25/2016   Procedure: CATARACT EXTRACTION PHACO AND INTRAOCULAR LENS PLACEMENT (IOC);  Surgeon: Donzell Arlyce Budd, MD;  Location: [facility];  Service: Ophthalmology;  Laterality: Right;  RIGHT   CATARACT EXTRACTION W/PHACO Left 02/22/2016   Procedure: CATARACT EXTRACTION PHACO AND INTRAOCULAR LENS PLACEMENT (IOC);  Surgeon: Donzell Arlyce Budd,  MD;  Location: [facility];  Service: Ophthalmology;  Laterality: Left;  LEFT   HAMMER TOE SURGERY  05/16/2008   KNEE ARTHROSCOPY Right 05/16/2002   PACEMAKER IMPLANT N/A 10/25/2021   Procedure: PACEMAKER IMPLANT;  Surgeon: Cindie Ole DASEN, MD;  Location: [facility];  Service: Cardiovascular;  Laterality: N/A;   PARTIAL HIP ARTHROPLASTY Left 12/17/2021   REPLACEMENT TOTAL KNEE Right 05/17/2007   [facility]   SKIN GRAFT Left 04/08/2013   Done on left index finger   TONSILLECTOMY  05/16/1944   TOTAL KNEE ARTHROPLASTY Left 12/02/2020     Current Meds  Medication Sig   acetaminophen  (TYLENOL ) 325 MG tablet Take 650 mg by mouth every 6 (six) hours as needed for moderate pain.   allopurinol  (ZYLOPRIM ) 100 MG tablet TAKE ONE TABLET BY MOUTH EVERY DAY   bisoprolol  (ZEBETA ) 10 MG tablet Take 1 tablet (10 mg total) by mouth 2 (two) times daily.   calcium  carbonate (TUMS EX) 750 MG chewable tablet Chew 2 tablets by mouth daily as needed for heartburn.   Cholecalciferol  25 MCG (1000 UT) tablet Take 1,000 Units by mouth daily.  ELIQUIS  5 MG TABS tablet TAKE ONE TABLET TWICE DAILY   ezetimibe  (ZETIA ) 10 MG tablet TAKE 1 TABLET BY MOUTH DAILY   fluticasone  (FLONASE ) 50 MCG/ACT nasal spray Place 2 sprays into both nostrils daily.   furosemide  (LASIX ) 40 MG tablet TAKE 1 TABLET BY MOUTH DAILY. TAKE AN EXTRA TABLET AS NEEDED AFTER LUNCH FOR ABDOMINAL SWELLING, LEG SWELLING OR SHORTNESS OF BREATH   gabapentin  (NEURONTIN ) 600 MG tablet TAKE ONE TABLET BY MOUTH AT BEDTIME   Magnesium  300 MG CAPS Take by mouth.   Menthol, Topical Analgesic, (BIOFREEZE EX) Apply 1 application. topically daily as needed (Neck pain).   metaxalone  (SKELAXIN ) 800 MG tablet Take 1 tablet (800 mg total) by mouth daily as needed for muscle spasms.   Multiple Vitamin (MULTIVITAMIN) capsule Take 1 capsule by mouth daily.   mupirocin  ointment (BACTROBAN ) 2 % Place 1 application  into the nose 2  (two) times daily.   potassium chloride  (KLOR-CON ) 10 MEQ tablet TAKE 1 TABLET BY MOUTH DAILY   Tiotropium Bromide -Olodaterol (STIOLTO RESPIMAT ) 2.5-2.5 MCG/ACT AERS Inhale 2 puffs into the lungs daily.     Allergies:   Levofloxacin, Influenza vaccine recombinant, Influenza vaccines, and Oysters [shellfish allergy]   Social History   Tobacco Use   Smoking status: Former    Current packs/day: 0.00    Average packs/day: 1 pack/day for 30.0 years (30.0 ttl pk-yrs)    Types: Cigarettes    Start date: 05/17/1959    Quit date: 05/16/1989    Years since quitting: 34.0    Passive exposure: Past   Smokeless tobacco: Never  Vaping Use   Vaping status: Never Used  Substance Use Topics   Alcohol use: Yes    Alcohol/week: 7.0 standard drinks of alcohol    Types: 7 Glasses of wine per week    Comment: 0-2 a night   Drug use: No    Family Hx: [name] family history includes Atrial fibrillation in her sister and sister; Breast cancer (age of onset: 61) in her sister; Healthy in her brother; Heart attack in her father; Heart disease in her mother; High blood pressure in her father; Hyperlipidemia in her sister and sister; Stroke in her father; Transient ischemic attack in her mother.  ROS:   Please see the history of present illness.    Review of Systems  Constitutional: Negative.   HENT: Negative.    Respiratory:  Positive for shortness of breath.   Cardiovascular: Negative.   Gastrointestinal: Negative.   Musculoskeletal:  Positive for joint pain.  Neurological: Negative.   Psychiatric/Behavioral: Negative.    All other systems reviewed and are negative.    Labs/Other Tests and Data Reviewed:    Recent Labs: 08/04/2022: NT-Pro BNP 2,017 05/24/2023: ALT 22 05/26/2023: BUN 25; Creatinine, Ser 1.04; Hemoglobin 13.5; Platelets 217; Potassium 3.5; Sodium 141   Recent Lipid Panel Lab Results  Component Value Date/Time   CHOL 176 10/28/2022 08:01 AM   TRIG 126 10/28/2022 08:01 AM    HDL 54 10/28/2022 08:01 AM   CHOLHDL 3.3 10/28/2022 08:01 AM   CHOLHDL 3.6 05/31/2018 04:52 AM   LDLCALC 100 (H) 10/28/2022 08:01 AM    Wt Readings from Last 3 Encounters:  05/29/23 219 lb 6 oz (99.5 kg)  05/26/23 220 lb 7.4 oz (100 kg)  05/24/23 220 lb 11.2 oz (100.1 kg)     Exam:    BP (!) 160/80 (BP Location: Left Arm, Patient Position: Sitting, Cuff Size: Normal)   Pulse 73  Ht 5' 7.5 (1.715 m)   Wt 219 lb 6 oz (99.5 kg)   SpO2 96%   BMI 33.85 kg/m  Constitutional:  oriented to person, place, and time. No distress.  HENT:  Head: Grossly normal Eyes:  no discharge. No scleral icterus.  Neck: No JVD, no carotid bruits  Cardiovascular: Regular rate and rhythm, no murmurs appreciated Pulmonary/Chest: Clear to auscultation bilaterally, no wheezes or rails Abdominal: Soft.  no distension.  no tenderness.  Musculoskeletal: Normal range of motion Neurological:  normal muscle tone. Coordination normal. No atrophy Skin: Skin warm and dry Psychiatric: normal affect, pleasant   ASSESSMENT & PLAN:    Permanent atrial fibrillation (HCC) -  History of AV node ablation, ventricularly paced Permanent atrial fibrillation Followed by EP Tolerating Eliquis , continue bisoprolol   Chronic shortness of breath Likely multifactorial including underlying lung disease, A-fib, ventricularly paced, chronic diastolic CHF, obesity, deconditioning Recommend regular walking program, calorie restriction  Chronic diastolic CHF (congestive heart failure) (HCC) Stable on Lasix  40 daily Echocardiogram May 2024 with mildly elevated right heart pressures  Leg edema We will avoid calcium  channel blockers Minimal edema on today's visit, continue Lasix  40 daily   Centrilobular emphysema (HCC)  Prior history of smoking Followed by pulmonary,  Sleep apnea CPAP mask fitting better, now compliant  Obesity Low carbohydrate diet recommended, regular walking program  Pulmonary hypertension,  unspecified (HCC) Mild pulmonary hypertension seen on prior echocardiogram exacerbated by atrial fibrillation and CHF, obesity Continue Lasix  40 daily  Back pain, knee pain Stable  Fatigue Poor sleep at baseline, deconditioned  Signed, Evalene Lunger, MD  05/29/2023 5:01 PM    [facility] [address], [place], [place] [address]

## 2023-05-29 NOTE — Patient Instructions (Addendum)
 Medication Instructions:  Meclizine  25 mg every 6 hrs as needed  If you need a refill on your cardiac medications before your next appointment, please call your pharmacy.   Lab work: No new labs needed  Testing/Procedures: No new testing needed  Follow-Up: At [facility], you and your health needs are our priority.  As part of our continuing mission to provide you with exceptional heart care, we have created designated Provider Care Teams.  These Care Teams include your primary Cardiologist (physician) and Advanced Practice Providers (APPs -  Physician Assistants and Nurse Practitioners) who all work together to provide you with the care you need, when you need it.  You will need a follow up appointment in 12 months  Providers on your designated Care Team:   Lonni Meager, NP Bernardino Bring, PA-C Cadence [place], [place]  COVID-19 Vaccine Information can be found at: [email or web address] For questions related to vaccine distribution or appointments, please email [email or web address] or call [phone].

## 2023-05-29 NOTE — Telephone Encounter (Signed)
 Last office visit: 01/03/23 with plan to f/u 6 months. next office visit: none/active recall

## 2023-05-30 ENCOUNTER — Encounter: Payer: Medicare Other | Admitting: *Deleted

## 2023-05-30 DIAGNOSIS — I5032 Chronic diastolic (congestive) heart failure: Secondary | ICD-10-CM | POA: Diagnosis not present

## 2023-05-30 NOTE — Transitions of Care (Post Inpatient/ED Visit) (Signed)
   05/30/2023  Name: Lauren Lloyd MRN: [id number] DOB: 06/24/1940  Today's TOC FU Call Status: Today's TOC FU Call Status:: Unsuccessful Call (2nd Attempt) Unsuccessful Call (1st Attempt) Date: 05/29/23 Unsuccessful Call (2nd Attempt) Date: 05/30/23  Attempted to reach the patient regarding the most recent Inpatient/ED visit.  Follow Up Plan: Additional outreach attempts will be made to reach the patient to complete the Transitions of Care (Post Inpatient/ED visit) call.   Signature Julian Lemmings, LPN [facility] Nurse Health Advisor Direct Dial [phone]

## 2023-05-30 NOTE — Progress Notes (Signed)
 Daily Session Note  Patient Details  Name: Lauren Lloyd MRN: [id number] Date of Birth: 01/04/1941 Referring Provider:   Flowsheet Row Pulmonary Rehab from 02/13/2023 in [facility] Cardiac [facility]monary [facility]  Referring Provider Evalene Lunger, MD       Encounter Date: 05/30/2023  Check In:  Session Check In - 05/30/23 0820       Check-In   Supervising physician immediately available to respond to emergencies See telemetry face sheet for immediately available ER MD    Location [facility]-Cardiac & Pulmonary Rehab    Staff Present Othel Durand, RN, BSN, CCRP;Margaret Best, MS, Exercise Physiologist;Krista Jacques RN, BSN;Maxon Conetta BS, , Exercise Physiologist    Virtual Visit No    Medication changes reported     No    Fall or balance concerns reported    No    Warm-up and Cool-down Performed on first and last piece of equipment    Resistance Training Performed Yes    VAD Patient? No    PAD/SET Patient? No      Pain Assessment   Currently in Pain? No/denies                Social History   Tobacco Use  Smoking Status Former   Current packs/day: 0.00   Average packs/day: 1 pack/day for 30.0 years (30.0 ttl pk-yrs)   Types: Cigarettes   Start date: 05/17/1959   Quit date: 05/16/1989   Years since quitting: 34.0   Passive exposure: Past  Smokeless Tobacco Never    Goals Met:  Proper associated with RPD/PD & O2 Sat Independence with exercise equipment Exercise tolerated well No report of concerns or symptoms today  Goals Unmet:  Not Applicable  Comments: Pt able to follow exercise prescription today without complaint.  Will continue to monitor for progression.    Dr. Oneil Pinal is Medical Director for [facility] Cardiac Rehabilitation.  Dr. Fuad Aleskerov is Medical Director for [facility] Pulmonary Rehabilitation.

## 2023-05-31 ENCOUNTER — Telehealth: Payer: Self-pay | Admitting: Cardiology

## 2023-05-31 DIAGNOSIS — I5032 Chronic diastolic (congestive) heart failure: Secondary | ICD-10-CM

## 2023-05-31 NOTE — Telephone Encounter (Signed)
 Patient is calling to talk with Dr. Marven Slimmer or nurse

## 2023-05-31 NOTE — Progress Notes (Signed)
 Pulmonary Individual Treatment Plan  Patient Details  Name: ROANNA SCHAMBACH MRN: [id number] Date of Birth: May 08, 1941 Referring Provider:   Flowsheet Row Pulmonary Rehab from 02/13/2023 in [facility] Cardiac and Pulmonary Rehab  Referring Provider Belva Boyden, MD       Initial Encounter Date:  Flowsheet Row Pulmonary Rehab from 02/13/2023 in [facility] Cardiac and Pulmonary Rehab  Date 02/13/23       Visit Diagnosis: Heart failure, diastolic, chronic (HCC)  Patient's Home Medications on Admission:  Current Outpatient Medications:    acetaminophen  (TYLENOL ) 325 MG tablet, Take 650 mg by mouth every 6 (six) hours as needed for moderate pain., Disp: , Rfl:    allopurinol  (ZYLOPRIM ) 100 MG tablet, TAKE ONE TABLET BY MOUTH EVERY DAY, Disp: 90 tablet, Rfl: 1   bisoprolol  (ZEBETA ) 10 MG tablet, Take 1 tablet (10 mg total) by mouth 2 (two) times daily., Disp: 180 tablet, Rfl: 3   calcium  carbonate (TUMS EX) 750 MG chewable tablet, Chew 2 tablets by mouth daily as needed for heartburn., Disp: , Rfl:    Cholecalciferol  25 MCG (1000 UT) tablet, Take 1,000 Units by mouth daily., Disp: , Rfl:    ELIQUIS  5 MG TABS tablet, TAKE ONE TABLET TWICE DAILY, Disp: 180 tablet, Rfl: 1   ezetimibe  (ZETIA ) 10 MG tablet, TAKE 1 TABLET BY MOUTH DAILY, Disp: 90 tablet, Rfl: 3   fluticasone  (FLONASE ) 50 MCG/ACT nasal spray, Place 2 sprays into both nostrils daily., Disp: 16 g, Rfl: 6   furosemide  (LASIX ) 40 MG tablet, TAKE 1 TABLET BY MOUTH DAILY. TAKE AN EXTRA TABLET AS NEEDED AFTER LUNCH FOR ABDOMINAL SWELLING, LEG SWELLING OR SHORTNESS OF BREATH, Disp: 180 tablet, Rfl: 1   gabapentin  (NEURONTIN ) 600 MG tablet, TAKE ONE TABLET BY MOUTH AT BEDTIME, Disp: 90 tablet, Rfl: 0   Magnesium  300 MG CAPS, Take by mouth., Disp: , Rfl:    meclizine  (ANTIVERT ) 25 MG tablet, Take 1 tablet (25 mg total) by mouth 3 (three) times daily as needed for dizziness., Disp: 30 tablet, Rfl: 1   Menthol, Topical Analgesic, (BIOFREEZE EX), Apply 1  application. topically daily as needed (Neck pain)., Disp: , Rfl:    metaxalone  (SKELAXIN ) 800 MG tablet, Take 1 tablet (800 mg total) by mouth daily as needed for muscle spasms., Disp: 30 tablet, Rfl: 2   Multiple Vitamin (MULTIVITAMIN) capsule, Take 1 capsule by mouth daily., Disp: , Rfl:    mupirocin  ointment (BACTROBAN ) 2 %, Place 1 application  into the nose 2 (two) times daily., Disp: 22 g, Rfl: 0   potassium chloride  (KLOR-CON ) 10 MEQ tablet, TAKE 1 TABLET BY MOUTH DAILY, Disp: 30 tablet, Rfl: 5   Tiotropium Bromide -Olodaterol (STIOLTO RESPIMAT ) 2.5-2.5 MCG/ACT AERS, Inhale 2 puffs into the lungs daily., Disp: 4 g, Rfl: 0  Past Medical History: Past Medical History:  Diagnosis Date   (HFpEF) heart failure with preserved ejection fraction (HCC)    a. 05/2018 Echo: EF 55-60%, no rwma, mild to mod MR. Nl RV fxn. Mod TR. PASP .   Arthritis    knees, Hands   Arthritis of knee    Back pain    Carotid arterial disease (HCC)    a. 03/2019 Carotid U/S: <50% bilat ICA stenoses.   Cholelithiasis    a. 10/2018 noted on CT.   Edema, lower extremity    Fatty liver    GERD (gastroesophageal reflux disease)    History of stress test    a. 06/2018 MV: EF 59%, no ischemia/infarct. Low risk.  Knee pain    Lactose intolerance    Mitral regurgitation    a. 05/2018 Echo: mild to mod MR.   Multinodular goiter    Obesity    OSA (obstructive sleep apnea)    PAF (paroxysmal atrial fibrillation) (HCC)    a.  Diagnosed 12/19; b. 05/2018 s/p DCCV; c. 03/2019 & 05/2019 recurrent AFib-->managed w/ amio load; d. CHADS2VASc = 6 (CHF, HTN, age x 2, vascular disease, female)-->Eliquis  & amio 100 qd.   PAH (pulmonary artery hypertension) (HCC)    RSV (acute bronchiolitis due to respiratory syncytial virus) 05/10/2022   Scoliosis    SOB (shortness of breath)    Swallowing difficulty     Tobacco Use: Social History   Tobacco Use  Smoking Status Former   Current packs/day: 0.00   Average packs/day: 1  pack/day for 30.0 years (30.0 ttl pk-yrs)   Types: Cigarettes   Start date: 05/17/1959   Quit date: 05/16/1989   Years since quitting: 34.0   Passive exposure: Past  Smokeless Tobacco Never    Labs: Review Flowsheet  More data exists      Latest Ref Rng & Units 12/09/2019 08/03/2020 08/16/2021 03/17/2022 10/28/2022  Labs for ITP Cardiac and Pulmonary Rehab  Cholestrol 100 - 199 mg/dL 161  096  CANCELED  045  176   LDL (calc) 0 - 99 mg/dL 409  811  - 93  914   HDL-C >39 mg/dL 73  65  CANCELED  52  54   Trlycerides 0 - 149 mg/dL 782  956  CANCELED  94  126   Hemoglobin A1c 4.8 - 5.6 % 5.5  - - 6.0  6.0      Pulmonary Assessment Scores:  Pulmonary Assessment Scores     Row Name 02/13/23 1645         ADL UCSD   ADL Phase Entry     SOB Score total 57     Rest 0     Walk 3     Stairs 4     Bath 1     Dress 1     Shop 1       CAT Score   CAT Score 23       mMRC Score   mMRC Score 2              UCSD: Self-administered rating of dyspnea associated with activities of daily living (ADLs) 6-point scale (0 = "not at all" to 5 = "maximal or unable to do because of breathlessness")  Scoring Scores range from 0 to 120.  Minimally important difference is 5 units  CAT: CAT can identify the health impairment of COPD patients and is better correlated with disease progression.  CAT has a scoring range of zero to 40. The CAT score is classified into four groups of low (less than 10), medium (10 - 20), high (21-30) and very high (31-40) based on the impact level of disease on health status. A CAT score over 10 suggests significant symptoms.  A worsening CAT score could be explained by an exacerbation, poor medication adherence, poor inhaler technique, or progression of COPD or comorbid conditions.  CAT MCID is 2 points  mMRC: mMRC (Modified Medical Research Council) Dyspnea Scale is used to assess the degree of baseline functional disability in patients of respiratory disease due to  dyspnea. No minimal important difference is established. A decrease in score of 1 point or greater is considered a positive change.   Pulmonary  Function Assessment:  Pulmonary Function Assessment - 01/25/23 1416       Breath   Shortness of Breath Yes;Limiting activity             Exercise Target Goals: Exercise Program Goal: Individual exercise prescription set using results from initial 6 min walk test and THRR while considering  patient's activity barriers and safety.   Exercise Prescription Goal: Initial exercise prescription builds to 30-45 minutes a day of aerobic activity, 2-3 days per week.  Home exercise guidelines will be given to patient during program as part of exercise prescription that the participant will acknowledge.  Education: Aerobic Exercise: - Group verbal and visual presentation on the components of exercise prescription. Introduces F.I.T.T principle from ACSM for exercise prescriptions.  Reviews F.I.T.T. principles of aerobic exercise including progression. Written material given at graduation.   Education: Resistance Exercise: - Group verbal and visual presentation on the components of exercise prescription. Introduces F.I.T.T principle from ACSM for exercise prescriptions  Reviews F.I.T.T. principles of resistance exercise including progression. Written material given at graduation.    Education: Exercise & Equipment Safety: - Individual verbal instruction and demonstration of equipment use and safety with use of the equipment. Flowsheet Row Pulmonary Rehab from 04/27/2023 in [facility] Cardiac and Pulmonary Rehab  Date 02/13/23  Educator MB  Instruction Review Code 1- Verbalizes Understanding       Education: Exercise Physiology & General Exercise Guidelines: - Group verbal and written instruction with models to review the exercise physiology of the cardiovascular system and associated critical values. Provides general exercise guidelines with specific  guidelines to those with heart or lung disease.  Flowsheet Row Pulmonary Rehab from 04/27/2023 in [facility] Cardiac and Pulmonary Rehab  Education need identified 02/13/23       Education: Flexibility, Balance, Mind/Body Relaxation: - Group verbal and visual presentation with interactive activity on the components of exercise prescription. Introduces F.I.T.T principle from ACSM for exercise prescriptions. Reviews F.I.T.T. principles of flexibility and balance exercise training including progression. Also discusses the mind body connection.  Reviews various relaxation techniques to help reduce and manage stress (i.e. Deep breathing, progressive muscle relaxation, and visualization). Balance handout provided to take home. Written material given at graduation.   Activity Barriers & Risk Stratification:  Activity Barriers & Cardiac Risk Stratification - 02/13/23 1640       Activity Barriers & Cardiac Risk Stratification   Activity Barriers Arthritis;Back Problems;Shortness of Breath             6 Minute Walk:  6 Minute Walk     Row Name 02/13/23 1638         6 Minute Walk   Phase Initial     Distance 945 feet     Walk Time 6 minutes     # of Rest Breaks 0     MPH 1.79     METS 1.54     RPE 15     Perceived Dyspnea  3     VO2 Peak 5.41     Symptoms Yes (comment)     Comments back pain     Resting HR 70 bpm     Resting BP 130/74     Resting Oxygen Saturation  92 %     Exercise Oxygen Saturation  during 6 min walk 91 %     Max Ex. HR 111 bpm     Max Ex. BP 166/72     2 Minute Post BP 150/74  Interval HR   1 Minute HR 103     2 Minute HR 107     3 Minute HR 109     4 Minute HR 111     5 Minute HR 82     6 Minute HR 83     2 Minute Post HR 70     Interval Heart Rate? Yes       Interval Oxygen   Interval Oxygen? Yes     Baseline Oxygen Saturation % 92 %     1 Minute Oxygen Saturation % 91 %     1 Minute Liters of Oxygen 0 L     2 Minute Oxygen Saturation % 92 %      2 Minute Liters of Oxygen 0 L     3 Minute Oxygen Saturation % 91 %     3 Minute Liters of Oxygen 0 L     4 Minute Oxygen Saturation % 91 %     4 Minute Liters of Oxygen 0 L     5 Minute Oxygen Saturation % 95 %     5 Minute Liters of Oxygen 0 L     6 Minute Oxygen Saturation % 93 %     6 Minute Liters of Oxygen 0 L     2 Minute Post Oxygen Saturation % 96 %     2 Minute Post Liters of Oxygen 0 L             Oxygen Initial Assessment:  Oxygen Initial Assessment - 01/25/23 1415       Home Oxygen   Home Oxygen Device None    Sleep Oxygen Prescription CPAP    Liters per minute --   off and on   Home Exercise Oxygen Prescription None    Home Resting Oxygen Prescription None    Compliance with Home Oxygen Use Yes      Initial 6 min Walk   Oxygen Used None      Program Oxygen Prescription   Program Oxygen Prescription None      Intervention   Short Term Goals To learn and exhibit compliance with exercise, home and travel O2 prescription;To learn and understand importance of monitoring SPO2 with pulse oximeter and demonstrate accurate use of the pulse oximeter.;To learn and understand importance of maintaining oxygen saturations>88%;To learn and demonstrate proper pursed lip breathing techniques or other breathing techniques. ;To learn and demonstrate proper use of respiratory medications    Long  Term Goals Exhibits compliance with exercise, home  and travel O2 prescription;Verbalizes importance of monitoring SPO2 with pulse oximeter and return demonstration;Maintenance of O2 saturations>88%;Exhibits proper breathing techniques, such as pursed lip breathing or other method taught during program session;Compliance with respiratory medication;Demonstrates proper use of MDI's             Oxygen Re-Evaluation:  Oxygen Re-Evaluation     Row Name 02/16/23 0813 03/02/23 0802 04/04/23 0850 05/30/23 0814       Program Oxygen Prescription   Program Oxygen Prescription -- None  None None      Home Oxygen   Home Oxygen Device -- None None None    Sleep Oxygen Prescription -- CPAP CPAP CPAP    Liters per minute -- -- --  off and on --  off and on    Home Exercise Oxygen Prescription -- None None None    Home Resting Oxygen Prescription -- None None None    Compliance with Home Oxygen Use -- No  No No      Goals/Expected Outcomes   Short Term Goals -- To learn and demonstrate proper pursed lip breathing techniques or other breathing techniques.  To learn and demonstrate proper pursed lip breathing techniques or other breathing techniques.  To learn and demonstrate proper pursed lip breathing techniques or other breathing techniques.     Long  Term Goals -- Exhibits proper breathing techniques, such as pursed lip breathing or other method taught during program session Exhibits proper breathing techniques, such as pursed lip breathing or other method taught during program session Exhibits proper breathing techniques, such as pursed lip breathing or other method taught during program session    Comments Reviewed PLB technique with pt.  Talked about how it works and it's importance in maintaining their exercise saturations. Informed patient how to perform the Pursed Lipped breathing technique. Told patient to Inhale through the nose and out the mouth with pursed lips to keep their airways open, help oxygenate them better, practice when at rest or doing strenuous activity. Patient Verbalizes understanding of technique and will work on and be reiterated during [facility]. Reviewed pursed lipped breathing with patient. She verbalized understanding and will continue to work on it during rest and during exercise. Checked in with Talon to see how her pursed lipped breathing is going with rest and exercise after the goal set last review. She has been adding it in more when she is noticing shortness of breath, such as walking from the parking lot to rehab. Discussed to practice it each time  she remembers to do pursed lipped breathing to build the habit.    Goals/Expected Outcomes Short: Become more profiecient at using PLB. Long: Become independent at using PLB. Short: use PLB with exertion. Long: use PLB on exertion proficiently and independently. Short: use pursed lipped breathing with rest and exercise. Long: use pursed lipped breathing independently with exercise. Short: Consistently use pursed lipped breathing with rest and exercise when she becomes short of breath. Long: use pursed lipped breathing independently with exercise and rest.             Oxygen Discharge (Final Oxygen Re-Evaluation):  Oxygen Re-Evaluation - 05/30/23 0814       Program Oxygen Prescription   Program Oxygen Prescription None      Home Oxygen   Home Oxygen Device None    Sleep Oxygen Prescription CPAP    Liters per minute --   off and on   Home Exercise Oxygen Prescription None    Home Resting Oxygen Prescription None    Compliance with Home Oxygen Use No      Goals/Expected Outcomes   Short Term Goals To learn and demonstrate proper pursed lip breathing techniques or other breathing techniques.     Long  Term Goals Exhibits proper breathing techniques, such as pursed lip breathing or other method taught during program session    Comments Checked in with Ameah to see how her pursed lipped breathing is going with rest and exercise after the goal set last review. She has been adding it in more when she is noticing shortness of breath, such as walking from the parking lot to rehab. Discussed to practice it each time she remembers to do pursed lipped breathing to build the habit.    Goals/Expected Outcomes Short: Consistently use pursed lipped breathing with rest and exercise when she becomes short of breath. Long: use pursed lipped breathing independently with exercise and rest.  Initial Exercise Prescription:  Initial Exercise Prescription - 02/13/23 1600       Date of  Initial Exercise RX and Referring Provider   Date 02/13/23    Referring Provider Timothy Gollan, MD      Oxygen   Maintain Oxygen Saturation 88% or higher      Recumbant Bike   Level 1    RPM 50    Watts 14    Minutes 15    METs 1.54      NuStep   Level 1    SPM 80    Minutes 15    METs 1.54      Biostep-RELP   Level 1    SPM 50    Minutes 15    METs 1.54      Track   Laps 8    Minutes 15    METs 1.44      Prescription Details   Frequency (times per week) 2    Duration Progress to 30 minutes of continuous aerobic without signs/symptoms of physical distress      Intensity   THRR 40-80% of Max Heartrate 96-123    Ratings of Perceived Exertion 11-13    Perceived Dyspnea 0-4      Progression   Progression Continue to progress workloads to maintain intensity without signs/symptoms of physical distress.      Resistance Training   Training Prescription Yes    Weight 5lb    Reps 10-15             Perform Capillary Blood Glucose checks as needed.  Exercise Prescription Changes:   Exercise Prescription Changes     Row Name 02/13/23 1600 02/22/23 1100 03/09/23 0800 03/22/23 1600 04/06/23 1500     Response to Exercise   Blood Pressure (Admit) 130/74 146/74 164/82 140/70 150/74   Blood Pressure (Exercise) 166/72 116/70 148/62 134/60 122/60   Blood Pressure (Exit) 130/74 130/76 120/58 128/56 126/64   Heart Rate (Admit) 70 bpm 70 bpm 72 bpm 75 bpm 91 bpm   Heart Rate (Exercise) 111 bpm 81 bpm 123 bpm 128 bpm 91 bpm   Heart Rate (Exit) 70 bpm 70 bpm 72 bpm 70 bpm 77 bpm   Oxygen Saturation (Admit) 92 % 96 % 99 % 99 % 91 %   Oxygen Saturation (Exercise) 91 % 92 % 92 % 92 % 91 %   Oxygen Saturation (Exit) 94 % 95 % 95 % 97 % 96 %   Rating of Perceived Exertion (Exercise) 15 14 13 14 13    Perceived Dyspnea (Exercise) 3 3 2 1  0   Symptoms back pain none none none knee pain   Comments results -- -- -- --   Duration Progress to 30 minutes of  aerobic without  signs/symptoms of physical distress Progress to 30 minutes of  aerobic without signs/symptoms of physical distress Progress to 30 minutes of  aerobic without signs/symptoms of physical distress Progress to 30 minutes of  aerobic without signs/symptoms of physical distress Progress to 30 minutes of  aerobic without signs/symptoms of physical distress   Intensity THRR New THRR unchanged THRR unchanged THRR unchanged THRR unchanged     Progression   Progression Continue to progress workloads to maintain intensity without signs/symptoms of physical distress. Continue to progress workloads to maintain intensity without signs/symptoms of physical distress. Continue to progress workloads to maintain intensity without signs/symptoms of physical distress. Continue to progress workloads to maintain intensity without signs/symptoms of physical distress. Continue to  progress workloads to maintain intensity without signs/symptoms of physical distress.   Average METs 1.54 2.05 1.97 2.07 2.78     Resistance Training   Training Prescription -- Yes Yes Yes Yes   Weight -- 5lb 5lb 5lb 5lb   Reps -- 10-15 10-15 10-15 10-15     Interval Training   Interval Training -- No No No No     Treadmill   MPH -- -- -- 1.4 --   Grade -- -- -- 0 --   Minutes -- -- -- 15 --   METs -- -- -- 2.07 --     Recumbant Bike   Level -- -- -- -- 1   Watts -- -- -- -- 14   Minutes -- -- -- -- 15   METs -- -- -- -- 2.45     NuStep   Level -- -- 2 2 3    Minutes -- -- 15 15 15    METs -- -- 2.1 2.6 3.1     Biostep-RELP   Level -- 1 1 1  --   Minutes -- 15 15 15  --   METs -- 2 2 2  --     Track   Laps -- 20  Hallway 15  Hallway 17  hallway --   Minutes -- 15 15 15  --   METs -- 2.09 1.82 1.92 --     Oxygen   Maintain Oxygen Saturation -- 88% or higher 88% or higher 88% or higher 88% or higher    Row Name 04/17/23 1100 04/20/23 0700 05/04/23 1400 05/18/23 1600 05/29/23 1600     Response to Exercise   Blood Pressure  (Admit) 142/72 -- 148/80 128/68 128/68   Blood Pressure (Exercise) 158/80 -- -- -- --   Blood Pressure (Exit) 144/70 -- 128/60 130/62 130/62   Heart Rate (Admit) 79 bpm -- 73 bpm 82 bpm 82 bpm   Heart Rate (Exercise) 93 bpm -- 91 bpm 91 bpm 91 bpm   Heart Rate (Exit) 72 bpm -- 80 bpm 90 bpm 90 bpm   Oxygen Saturation (Admit) 100 % -- 94 % 93 % 93 %   Oxygen Saturation (Exercise) 93 % -- 95 % 93 % 93 %   Oxygen Saturation (Exit) 95 % -- 95 % 96 % 96 %   Rating of Perceived Exertion (Exercise) 15 -- 15 13 13    Perceived Dyspnea (Exercise) 0 -- 3 0 0   Symptoms none -- none none none   Duration Progress to 30 minutes of  aerobic without signs/symptoms of physical distress -- Progress to 30 minutes of  aerobic without signs/symptoms of physical distress Progress to 30 minutes of  aerobic without signs/symptoms of physical distress Progress to 30 minutes of  aerobic without signs/symptoms of physical distress   Intensity THRR unchanged -- THRR unchanged THRR unchanged THRR unchanged     Progression   Progression Continue to progress workloads to maintain intensity without signs/symptoms of physical distress. -- Continue to progress workloads to maintain intensity without signs/symptoms of physical distress. Continue to progress workloads to maintain intensity without signs/symptoms of physical distress. Continue to progress workloads to maintain intensity without signs/symptoms of physical distress.   Average METs 2.7 -- 2.34 2.74 2.74     Resistance Training   Training Prescription Yes -- Yes Yes Yes   Weight 5lb -- 6 lb 6 lb 6 lb   Reps 10-15 -- 10-15 10-15 10-15     Interval Training   Interval Training No -- No  No No     Treadmill   MPH 1.5 -- 1.7 1.3 1.3   Grade 0 -- 0 0.5 0.5   Minutes 15 -- 15 15 15    METs 2.15 -- 2.3 2.08 2.08     Recumbant Bike   Level 1.5 -- 1 -- --   Watts 14 -- 14 -- --   Minutes 15 -- 15 -- --   METs 2.45 -- 2.45 -- --     NuStep   Level 4 -- 3 4 4     Minutes 15 -- 15 15 15    METs 3.6 -- 3.2 3.2 3.2     Biostep-RELP   Level 2 -- 3 -- --   Minutes 15 -- 15 -- --   METs -- -- 2 -- --     Track   Laps 17 -- 17 -- --   Minutes 15 -- 15 -- --   METs 1.92 -- 1.92 -- --     Home Exercise Plan   Plans to continue exercise at -- Home (comment)  Hollin plans to try walking for 30 minutes twice a week, outside at home. She struggles walking for 15 minutes in the program but states she can break up the home ex. to 15 minute intervals. Home (comment)  Lamonica plans to try walking for 30 minutes twice a week, outside at home. She struggles walking for 15 minutes in the program but states she can break up the home ex. to 15 minute intervals. Home (comment)  Jaylnn plans to try walking for 30 minutes twice a week, outside at home. She struggles walking for 15 minutes in the program but states she can break up the home ex. to 15 minute intervals. Home (comment)  Madalen plans to try walking for 30 minutes twice a week, outside at home. She struggles walking for 15 minutes in the program but states she can break up the home ex. to 15 minute intervals.   Frequency -- Add 2 additional days to program exercise sessions. Add 2 additional days to program exercise sessions. Add 2 additional days to program exercise sessions. Add 2 additional days to program exercise sessions.   Initial Home Exercises Provided -- 04/20/23 04/20/23 04/20/23 04/20/23     Oxygen   Maintain Oxygen Saturation 88% or higher 88% or higher 88% or higher 88% or higher 88% or higher            Exercise Comments:   Exercise Comments     Row Name 02/16/23 8469           Exercise Comments First full day of exercise!  Patient was oriented to gym and equipment including functions, settings, policies, and procedures.  Patient's individual exercise prescription and treatment plan were reviewed.  All starting workloads were established based on the results of the 6 minute walk test done at  initial orientation visit.  The plan for exercise progression was also introduced and progression will be customized based on patient's performance and goals.                Exercise Goals and Review:   Exercise Goals     Row Name 02/13/23 1644             Exercise Goals   Increase Physical Activity Yes       Intervention Provide advice, education, support and counseling about physical activity/exercise needs.;Develop an individualized exercise prescription for aerobic and resistive training based on initial evaluation findings, risk stratification,  comorbidities and participant's personal goals.       Expected Outcomes Short Term: Attend rehab on a regular basis to increase amount of physical activity.;Long Term: Exercising regularly at least 3-5 days a week.;Long Term: Add in home exercise to make exercise part of routine and to increase amount of physical activity.       Increase Strength and Stamina Yes       Intervention Provide advice, education, support and counseling about physical activity/exercise needs.;Develop an individualized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 attend Pulmonary Rehabilitation for the exercise Encouraged to attend Pulmonary Rehabilitation for the exercise    Continue Psychosocial Services  Follow up required by staff Follow up required by staff Follow up required by staff Follow up required by staff             Psychosocial Discharge (Final Psychosocial Re-Evaluation):  Psychosocial Re-Evaluation - 05/30/23 0818       Psychosocial Re-Evaluation   Current issues with Current Stress Concerns    Comments Daisee has returned today to rehab and we discussed her current stress surrounding why she was out. She had a recent issue with dizziness that led to her receiving bloodwork that came back abnormal and she had to go to the ED to get the elevated levels down and reduce her elevated blood pressure. She also had choked on a shrimp last week causing her to recieve the heimlich, which also added some stress. Her brother in law had recently passed and was busy with events surrounded this that has also caused stress. Her stress has gone down this week.    Expected Outcomes Short: Continue to attend [facility] regularly for exercise benefits and stress managment. Long: Continue to improve symptoms and manage a positive mental state.    Interventions Encouraged to attend Pulmonary Rehabilitation for the exercise    Continue Psychosocial Services  Follow up required by staff             Education: Education Goals: Education classes will be provided on a weekly basis, covering required topics. Participant will state understanding/return demonstration of topics presented.  Learning Barriers/Preferences:  Learning Barriers/Preferences - 01/25/23 1416       Learning Barriers/Preferences   Learning Barriers None    Learning Preferences None             General Pulmonary Education Topics:  Infection Prevention: - Provides verbal and written material to individual with  discussion of infection control including proper hand washing and proper equipment cleaning during exercise session. Flowsheet Row Pulmonary Rehab from 04/27/2023 in [facility] Cardiac and Pulmonary Rehab  Date 02/13/23  Educator MB  Instruction Review Code 1- Verbalizes Understanding       Falls Prevention: - Provides verbal and written material to individual with discussion of falls prevention and safety. Flowsheet Row Pulmonary Rehab from 04/27/2023 in [facility] Cardiac and Pulmonary Rehab  Date 02/13/23  Educator MB  Instruction Review Code 1- Verbalizes Understanding  Chronic Lung Disease Review: - Group verbal instruction with posters, models, PowerPoint presentations and videos,  to review new updates, new respiratory medications, new advancements in procedures and treatments. Providing information on websites and "800" numbers for continued self-education. Includes information about supplement oxygen, available portable oxygen systems, continuous and intermittent flow rates, oxygen safety, concentrators, and Medicare reimbursement for oxygen. Explanation of Pulmonary Drugs, including class, frequency, complications, importance of spacers, rinsing mouth after steroid MDI's, and proper cleaning methods for nebulizers. Review of basic lung anatomy and physiology related to function, structure, and complications of lung disease. Review of risk factors. Discussion about methods for diagnosing sleep apnea and types of masks and machines for OSA. Includes a review of the use of types of environmental controls: home humidity, furnaces, filters, dust mite/pet prevention, HEPA vacuums. Discussion about weather changes, air quality and the benefits of nasal washing. Instruction on Warning signs, infection symptoms, calling MD promptly, preventive modes, and value of vaccinations. Review of effective airway clearance, coughing and/or vibration techniques. Emphasizing that all should Create an Action Plan.  Written material given at graduation. Flowsheet Row Pulmonary Rehab from 04/27/2023 in [facility] Cardiac and Pulmonary Rehab  Education need identified 02/13/23  Date 04/27/23  Educator [facility]  Instruction Review Code 1- Verbalizes Understanding       AED/CPR: - Group verbal and written instruction with the use of models to demonstrate the basic use of the AED with the basic ABC's of resuscitation.    Anatomy and Cardiac Procedures: - Group verbal and visual presentation and models provide information about basic cardiac anatomy and function. Reviews the testing methods done to diagnose heart disease and the outcomes of the test results. Describes the treatment choices: Medical Management, Angioplasty, or Coronary Bypass Surgery for treating various heart conditions including Myocardial Infarction, Angina, Valve Disease, and Cardiac Arrhythmias.  Written material given at graduation.   Medication Safety: - Group verbal and visual instruction to review commonly prescribed medications for heart and lung disease. Reviews the medication, class of the drug, and side effects. Includes the steps to properly store meds and maintain the prescription regimen.  Written material given at graduation.   Other: -Provides group and verbal instruction on various topics (see comments)   Knowledge Questionnaire Score:  Knowledge Questionnaire Score - 02/13/23 1651       Knowledge Questionnaire Score   Pre Score 13/18              Core Components/Risk Factors/Patient Goals at Admission:  Personal Goals and Risk Factors at Admission - 02/13/23 1655       Core Components/Risk Factors/Patient Goals on Admission    Weight Management Yes;Weight Loss    Intervention Weight Management: Develop a combined nutrition and exercise program designed to reach desired caloric intake, while maintaining appropriate intake of nutrient and fiber, sodium and fats, and appropriate energy expenditure required for the  weight goal.;Weight Management: Provide education and appropriate resources to help participant work on and attain dietary goals.;Weight Management/Obesity: Establish reasonable short term and long term weight goals.;Obesity: Provide education and appropriate resources to help participant work on and attain dietary goals.    Admit Weight 216 lb 1.6 oz (98 kg)    Goal Weight: Short Term 206 lb 1.6 oz (93.5 kg)    Goal Weight: Long Term 196 lb 1.6 oz (89 kg)    Expected Outcomes Short Term: Continue to assess and modify interventions until short term weight is achieved;Long Term: Adherence to nutrition and physical activity/exercise program aimed  toward attainment of established weight goal;Weight Maintenance: Understanding of the daily nutrition guidelines, which includes 25-35% calories from fat, 7% or less cal from saturated fats, less than 200mg  cholesterol, less than 1.5gm of sodium, & 5 or more servings of fruits and vegetables daily;Weight Loss: Understanding of general recommendations for a balanced deficit meal plan, which promotes 1-2 lb weight loss per week and includes a negative energy balance of [phone] kcal/d;Understanding recommendations for meals to include 15-35% energy as protein, 25-35% energy from fat, 35-60% energy from carbohydrates, less than 200mg  of dietary cholesterol, 20-35 gm of total fiber daily;Understanding of distribution of calorie intake throughout the day with the consumption of 4-5 meals/snacks    Improve shortness of breath with ADL's Yes    Intervention Provide education, individualized exercise plan and daily activity instruction to help decrease symptoms of SOB with activities of daily living.    Expected Outcomes Short Term: Improve cardiorespiratory fitness to achieve a reduction of symptoms when performing ADLs;Long Term: Be able to perform more ADLs without symptoms or delay the onset of symptoms    Heart Failure Yes    Intervention Provide a combined exercise and  nutrition program that is supplemented with education, support and counseling about heart failure. Directed toward relieving symptoms such as shortness of breath, decreased exercise tolerance, and extremity edema.    Expected Outcomes Improve functional capacity of life;Short term: Attendance in program 2-3 days a week with increased exercise capacity. Reported lower sodium intake. Reported increased fruit and vegetable intake. Reports medication compliance.;Short term: Daily weights obtained and reported for increase. Utilizing diuretic protocols set by physician.;Long term: Adoption of self-care skills and reduction of barriers for early signs and symptoms recognition and intervention leading to self-care maintenance.    Hypertension Yes    Intervention Provide education on lifestyle modifcations including regular physical activity/exercise, weight management, moderate sodium restriction and increased consumption of fresh fruit, vegetables, and low fat dairy, alcohol moderation, and smoking cessation.;Monitor prescription use compliance.    Expected Outcomes Short Term: Continued assessment and intervention until BP is < 140/62mm HG in hypertensive participants. < 130/18mm HG in hypertensive participants with diabetes, heart failure or chronic kidney disease.;Long Term: Maintenance of blood pressure at goal levels.             Education:Diabetes - Individual verbal and written instruction to review signs/symptoms of diabetes, desired ranges of glucose level fasting, after meals and with exercise. Acknowledge that pre and post exercise glucose checks will be done for 3 sessions at entry of program.   Know Your Numbers and Heart Failure: - Group verbal and visual instruction to discuss disease risk factors for cardiac and pulmonary disease and treatment options.  Reviews associated critical values for Overweight/Obesity, Hypertension, Cholesterol, and Diabetes.  Discusses basics of heart failure:  signs/symptoms and treatments.  Introduces Heart Failure Zone chart for action plan for heart failure.  Written material given at graduation.   Core Components/Risk Factors/Patient Goals Review:   Goals and Risk Factor Review     Row Name 03/02/23 0805 04/04/23 0916 05/02/23 0807 05/30/23 0839       Core Components/Risk Factors/Patient Goals Review   Personal Goals Review Improve shortness of breath with ADL's Improve shortness of breath with ADL's;Hypertension Weight Management/Obesity;Hypertension Weight Management/Obesity;Hypertension;Improve shortness of breath with ADL's    Review Spoke to patient about their shortness of breath and what they can do to improve. Patient has been informed of breathing techniques when starting the program. Patient is informed to  tell staff if they have had any med changes and that certain meds they are taking or not taking can be causing shortness of breath. Spoke with patient about their shortness of breath and she is seeing her pulmonologist today about her shortness of breath. She was also reminded of the breathing techniques when starting the program. Patient is not monitoring her blood pressure at home with her cuff at home. She was informed to start checking at home. Patient states that she is still trying to lose some weight but with th eholidays it is more difficult. She is still working on maintaining her diet, and trying to implement home exercise. Patient also states that she knows that she needs to be taking her blood pressure, but is currently not. She has been informed on why taking her blood pressure is import, and has been encouraged to take it at least once a day on non-program days. Elynor has returned to the program after being out and states that with the holidays and the funeral events for her brother in law, it has been very difficult to work on her weight loss goal as she was surrounded by unhealthy food and has been busy with people in her house  since 12/10. She has been working on implements home exercise and working her diet. She hopes to make more progress as the holiday events are over. She has been monitoring her blood pressure, motivated by her recent visit to the ED.She has been wroking on pursed lipped breathing to help improve her shortness of breath.    Expected Outcomes Short: Attend [facility] regularly to improve shortness of breath with ADL's. Long: maintain independence with ADL's Short: Attend [facility] regularly to improve shortness of breath with ADL's and check blood pressure at home. Long: maintain independence with ADL's and independence with monitoring blood pressure at home. Short: Attend [facility] regularly to improve shortness of breath with ADL's and check blood pressure at home, and continue to practice good habits in order to lose weight. Long: maintain independence with ADL's and independence with monitoring blood pressure at home. Short: Attend [facility] consistently to improve shortness of breath with ADL's and continue to check blood pressure at home, and work on her healthy diet and home exercise in order to lose weight. Long: maintain independence with ADL's and monitoring blood pressure at home.             Core Components/Risk Factors/Patient Goals at Discharge (Final Review):   Goals and Risk Factor Review - 05/30/23 0839       Core Components/Risk Factors/Patient Goals Review   Personal Goals Review Weight Management/Obesity;Hypertension;Improve shortness of breath with ADL's    Review Fannie has returned to the program after being out and states that with the holidays and the funeral events for her brother in law, it has been very difficult to work on her weight loss goal as she was surrounded by unhealthy food and has been busy with people in her house since 12/10. She has been working on implements home exercise and working her diet. She hopes to make more progress as the holiday events are over. She has  been monitoring her blood pressure, motivated by her recent visit to the ED.She has been wroking on pursed lipped breathing to help improve her shortness of breath.    Expected Outcomes Short: Attend [facility] consistently to improve shortness of breath with ADL's and continue to check blood pressure at home, and work on her healthy diet and home exercise  in order to lose weight. Long: maintain independence with ADL's and monitoring blood pressure at home.             ITP Comments:  ITP Comments     Row Name 01/25/23 1418 02/13/23 1638 02/15/23 1214 02/16/23 0811 03/15/23 1304   ITP Comments Virtual Visit completed. Patient informed on EP and RD appointment and 6 Minute walk test. Patient also informed of patient health questionnaires on My Chart. Patient Verbalizes understanding. Visit diagnosis can be found in [facility] 01/03/2023. Completed and gym orientation. Initial ITP created and sent for review to Dr. Faud Aleskerov, Medical Director. 30 Day review completed. Medical Director ITP review done, changes made as directed, and signed approval by Medical Director.   new to program First full day of exercise!  Patient was oriented to gym and equipment including functions, settings, policies, and procedures.  Patient's individual exercise prescription and treatment plan were reviewed.  All starting workloads were established based on the results of the 6 minute walk test done at initial orientation visit.  The plan for exercise progression was also introduced and progression will be customized based on patient's performance and goals. 30 Day review completed. Medical Director ITP review done, changes made as directed, and signed approval by Medical Director.    new to program    Row Name 04/05/23 1153 05/03/23 1014 05/31/23 1428       ITP Comments 30 Day review completed. Medical Director ITP review done, changes made as directed, and signed approval by Medical Director. 30 Day review completed.  Medical Director ITP review done, changes made as directed, and signed approval by Medical Director. 30 Day review completed. Medical Director ITP review done, changes made as directed, and signed approval by Medical Director.              Comments: 30 day review

## 2023-05-31 NOTE — Telephone Encounter (Signed)
 Left message for patient to call back

## 2023-05-31 NOTE — Transitions of Care (Post Inpatient/ED Visit) (Signed)
   05/31/2023  Name: Lauren Lloyd MRN: [id number] DOB: 25-Mar-1941  Today's TOC FU Call Status: Today's TOC FU Call Status:: Unsuccessful Call (3rd Attempt) Unsuccessful Call (1st Attempt) Date: 05/29/23 Unsuccessful Call (2nd Attempt) Date: 05/30/23 Unsuccessful Call (3rd Attempt) Date: 05/31/23  Attempted to reach the patient regarding the most recent Inpatient/ED visit.  Follow Up Plan: No further outreach attempts will be made at this time. We have been unable to contact the patient.  Signature Darrall Ellison, LPN [facility] Nurse Health Advisor Direct Dial [phone]

## 2023-06-05 ENCOUNTER — Telehealth: Payer: Medicare Other | Admitting: Family Medicine

## 2023-06-05 ENCOUNTER — Ambulatory Visit: Payer: Medicare Other | Attending: Cardiology | Admitting: Cardiology

## 2023-06-05 ENCOUNTER — Other Ambulatory Visit (HOSPITAL_COMMUNITY): Payer: Self-pay

## 2023-06-05 VITALS — BP 155/78 | HR 71 | Wt 219.2 lb

## 2023-06-05 DIAGNOSIS — I272 Pulmonary hypertension, unspecified: Secondary | ICD-10-CM

## 2023-06-05 DIAGNOSIS — I5031 Acute diastolic (congestive) heart failure: Secondary | ICD-10-CM

## 2023-06-05 DIAGNOSIS — I5032 Chronic diastolic (congestive) heart failure: Secondary | ICD-10-CM

## 2023-06-05 MED ORDER — DAPAGLIFLOZIN PROPANEDIOL 10 MG PO TABS: 10.0000 mg | ORAL_TABLET | Freq: Every day | ORAL | 6 refills | Status: AC

## 2023-06-05 NOTE — Patient Instructions (Signed)
Great to see you today!!!  Medication Changes:  STOP Bisoprolol  START Farxiga 10 mg Daily Your provider has prescribed Lauren Lloyd for you. Please be aware the most common side effect of this medication is urinary tract infections and yeast infections. Please practice good hygiene and keep this area clean and dry to help prevent this. If you do begin to have symptoms of these infections, such as difficulty urinating or painful urination,  please let us know.   Lab Work:  Labs done today, your results will be available in MyChart, we will contact you for abnormal readings.   Testing/Procedures:  You have been ordered a PYP Scan.  This is done at [facility], they will call you to schedule.  When you come for this test please plan to be there 2-3 hours.  They are located at: [address] [place], [place] [address]   Special Instructions // Education:  Do the following things EVERYDAY: Weigh yourself in the morning before breakfast. Write it down and keep it in a log. Take your medicines as prescribed Eat low salt foods--Limit salt (sodium) to 2000 mg per day.  Stay as active as you can everyday Limit all fluids for the day to less than 2 liters   Follow-Up in: 1 month    If you have any questions or concerns before your next appointment please send [name] a message through mychart or call our office at [phone] Monday-Friday 8 am-5 pm.   If you have an urgent need after hours on the weekend please call your Primary Cardiologist or the [facility] in [place] at [phone].   At the [facility], you and your health needs are our priority. We have a designated team specialized in the treatment of Heart Failure. This Care Team includes your primary Heart Failure Specialized Cardiologist (physician), Advanced Practice Providers (APPs- Physician Assistants and Nurse Practitioners), and Pharmacist who all work together to provide you with the care  you need, when you need it.   You may see any of the following providers on your designated Care Team at your next follow up:  Dr. Arvilla Meres Dr. Marca Ancona Dr. Dorthula Nettles Dr. Theresia Bough Tonye Becket, NP Robbie Lis, [place] [address] [place], [place] Brynda Peon, NP [place] Lee, NP Clarisa Kindred, NP Enos Fling, PharmD

## 2023-06-05 NOTE — Progress Notes (Unsigned)
   ADVANCED HEART FAILURE NEW PATIENT CLINIC NOTE  Referring Physician: Chesley Noon, MD  Primary Care: Erasmo Downer, MD Primary Cardiologist: Dr. Mariah Milling  HPI: Lauren Lloyd is a 83 y.o. female with a PMH of HFpEF, permanent atrial fibrillation s/p AV nodal ablation, HTN, OSA, COPD who presents for initial visit for further evaluation and treatment of heart failure/cardiomyopathy.     83 y.o. female with history of permanent atrial fibrillation, PPM 06/23 followed by AV nodal ablation 02/24, chronic diastolic CHF, HTN, OSA on CPAP, prior tobacco use/COPD, chronic dyspnea.  Last echo 05/24: EF 60-65%, RV okay, RVSP 38 mmHg, moderate TR  She was seen in the ED 05/26/23 for abnormal labs at PCP visit. Had noted orthostatic dizziness and dyspnea with exertion. HS troponin mildly elevated at 16 and BUN also elevated. She was hypertensive but vitals otherwise stable.  Troponin trend and ECG not c/w ACS. CXR with some evidence of CHF. She was given an extra dose of PO lasix. Felt to be stable for discharge home and was referred to Cardiology and Advanced Heart Failure clinic.     SUBJECTIVE: Patient reports ongoing symptoms of shortness of breath with mild to moderate exertion.  Previously she had been fairly healthy but for the past few months she has noticed difficulty with walking more than a few steps buphill. She has a recent history of unsteadiness and neuropathy, as well as recent dx of carpal tunnel syndrome. She feels worse after taking her bisoprolol. She did not notice much of a change in her energy level after the BiV pacer.   PMH, current medications, allergies, social history, and family history reviewed in epic.  PHYSICAL EXAM: Vitals:   06/05/23 1159  BP: (!) 155/78  Pulse: 71  SpO2: 98%   GENERAL: Well nourished and in no apparent distress at rest.  HEENT: The mucous membranes are pink and moist.   PULM:  Normal work of breathing, clear to auscultation  bilaterally. Respirations are unlabored.  CARDIAC:  JVP: mildly elevated         Normal rate with regular rhythm. No murmurs, rubs or gallops.  1+ edema.  ABDOMEN: Soft, non-tender, non-distended. NEUROLOGIC: Patient is oriented x3 with no focal or lateralizing neurologic deficits.  PSYCH: Patients affect is appropriate, there is no evidence of anxiety or depression.  SKIN: Warm and dry; no lesions or wounds. Warm and well perfused extremities.  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 Will stop bisoprolol given BiV pacing and normal EF, start SGLT-2 for augmentation of diuresis, and plan for TTR amyloid workup. If negative can pursue BP control with entresto/spironolactone. - Continue lasix 40mg  daily - Start farxiga 10mg  daily - Stop bisoprolol - PYP scan, AL lab workup - Close follow up  Permanent atrial fibrillation: S/p BiV pacer, prior LBBB. Does not appear to have worsened exercise capacity, pacer working well. - EP follow up  HTN: Better controlled at home. - [employer] as above  Obesity: Complicates care.  Clearnce Hasten, MD Advanced Heart Failure Mechanical Circulatory Support 06/06/23

## 2023-06-06 ENCOUNTER — Telehealth: Payer: Self-pay | Admitting: Cardiology

## 2023-06-06 NOTE — Telephone Encounter (Signed)
"  pt lvm saying she prescribed the wrong medication she is suppose to have." Per Haze Rushing  Called pt to discuss. Pt stated someone already called her and took care of it. Pt thanked me and ended call.

## 2023-06-08 ENCOUNTER — Encounter: Payer: Medicare Other | Admitting: *Deleted

## 2023-06-08 DIAGNOSIS — I5032 Chronic diastolic (congestive) heart failure: Secondary | ICD-10-CM

## 2023-06-08 NOTE — Progress Notes (Signed)
Daily Session Note  Patient Details  Name: Lauren Lloyd MRN: [id number] Date of Birth: 29-Sep-1940 Referring Provider:   Flowsheet Row Pulmonary Rehab from 02/13/2023 in [facility] Cardiac and Pulmonary Rehab  Referring Provider Julien Nordmann, MD       Encounter Date: 06/08/2023  Check In:  Session Check In - 06/08/23 0753       Check-In   Supervising physician immediately available to respond to emergencies See telemetry face sheet for immediately available ER MD    Location [facility]-Cardiac & Pulmonary Rehab    Staff Present Rory Percy, MS, Exercise Physiologist;Laakea Pereira, RN, BSN, CCRP;Jason Wallace Cullens RDN,LDN;Maxon Conetta BS, Exercise Physiologist    Virtual Visit No    Medication changes reported     No    Fall or balance concerns reported    No    Warm-up and Cool-down Performed on first and last piece of equipment    Resistance Training Performed Yes    VAD Patient? No    PAD/SET Patient? No      Pain Assessment   Currently in Pain? No/denies             Med changes in process  has not picked up new med yet   Social History   Tobacco Use  Smoking Status Former   Current packs/day: 0.00   Average packs/day: 1 pack/day for 30.0 years (30.0 ttl pk-yrs)   Types: Cigarettes   Start date: 05/17/1959   Quit date: 05/16/1989   Years since quitting: 34.0   Passive exposure: Past  Smokeless Tobacco Never    Goals Met:  Proper associated with RPD/PD & O2 Sat Independence with exercise equipment Exercise tolerated well No report of concerns or symptoms today  Goals Unmet:  Not Applicable  Comments: Pt able to follow exercise prescription today without complaint.  Will continue to monitor for progression.    Dr. Bethann Punches is Medical Director for [facility] Cardiac Rehabilitation.  Dr. Vida Rigger is Medical Director for [facility] Pulmonary Rehabilitation.

## 2023-06-12 ENCOUNTER — Ambulatory Visit (INDEPENDENT_AMBULATORY_CARE_PROVIDER_SITE_OTHER): Payer: Medicare Other | Admitting: Family Medicine

## 2023-06-12 ENCOUNTER — Encounter: Payer: Self-pay | Admitting: Family Medicine

## 2023-06-12 VITALS — BP 145/65 | HR 70 | Ht 67.5 in | Wt 220.0 lb

## 2023-06-12 DIAGNOSIS — I5032 Chronic diastolic (congestive) heart failure: Secondary | ICD-10-CM | POA: Diagnosis not present

## 2023-06-12 DIAGNOSIS — H8110 Benign paroxysmal vertigo, unspecified ear: Secondary | ICD-10-CM

## 2023-06-12 DIAGNOSIS — I4819 Other persistent atrial fibrillation: Secondary | ICD-10-CM

## 2023-06-12 NOTE — Progress Notes (Signed)
Established patient visit   Patient: Lauren Lloyd   DOB: 1941/04/10   [name]  MRN: [id number] Visit Date: 06/12/2023  Today's healthcare provider: Shirlee Latch, MD   Chief Complaint  Patient presents with   follow-up ER   Subjective    HPI   Discussed the use of AI scribe software for clinical note transcription with the patient, who gave verbal consent to proceed.  History of Present Illness   The patient, with a history of afib and CHF, presents with ongoing vertigo. The vertigo has been severe enough to require the use of a walker for three days during the initial week of onset. The symptoms have since improved but have not completely resolved. The patient reports that the vertigo is worse in the mornings upon sitting up after lying down all night, with the room appearing to spin. The patient also experiences difficulty walking initially due to the vertigo. The patient has been prescribed meclizine for the vertigo but reports that it makes her feel like a "zombie" and prefers not to take it as often.  The patient also discusses financial difficulties in affording prescribed medications, Eliquis and [place]. The patient is considering discontinuing one or both medications due to the high cost. The patient is scheduled to see a cardiologist from the heart failure clinic next month and plans to discuss these financial concerns.         Medications: Outpatient Medications Prior to Visit  Medication Sig   acetaminophen (TYLENOL) 325 MG tablet Take 650 mg by mouth every 6 (six) hours as needed for moderate pain.   allopurinol (ZYLOPRIM) 100 MG tablet TAKE ONE TABLET BY MOUTH EVERY DAY   calcium carbonate (TUMS EX) 750 MG chewable tablet Chew 2 tablets by mouth daily as needed for heartburn.   Cholecalciferol 25 MCG (1000 UT) tablet Take 1,000 Units by mouth daily.   dapagliflozin propanediol (FARXIGA) 10 MG TABS tablet Take 1 tablet (10 mg total) by mouth daily  before breakfast.   ELIQUIS 5 MG TABS tablet TAKE ONE TABLET TWICE DAILY   ezetimibe (ZETIA) 10 MG tablet TAKE 1 TABLET BY MOUTH DAILY   fluticasone (FLONASE) 50 MCG/ACT nasal spray Place 2 sprays into both nostrils daily.   furosemide (LASIX) 40 MG tablet TAKE 1 TABLET BY MOUTH DAILY. TAKE AN EXTRA TABLET AS NEEDED AFTER LUNCH FOR ABDOMINAL SWELLING, LEG SWELLING OR SHORTNESS OF BREATH   gabapentin (NEURONTIN) 600 MG tablet TAKE ONE TABLET BY MOUTH AT BEDTIME   Magnesium 300 MG CAPS Take by mouth.   Menthol, Topical Analgesic, (BIOFREEZE EX) Apply 1 application. topically daily as needed (Neck pain).   metaxalone (SKELAXIN) 800 MG tablet Take 1 tablet (800 mg total) by mouth daily as needed for muscle spasms.   Multiple Vitamin (MULTIVITAMIN) capsule Take 1 capsule by mouth daily.   mupirocin ointment (BACTROBAN) 2 % Place 1 application  into the nose 2 (two) times daily.   potassium chloride (KLOR-CON) 10 MEQ tablet TAKE 1 TABLET BY MOUTH DAILY   Tiotropium Bromide-Olodaterol (STIOLTO RESPIMAT) 2.5-2.5 MCG/ACT AERS Inhale 2 puffs into the lungs daily.   meclizine (ANTIVERT) 25 MG tablet Take 1 tablet (25 mg total) by mouth 3 (three) times daily as needed for dizziness. (Patient not taking: Reported on 06/12/2023)   No facility-administered medications prior to visit.    Review of Systems     Objective    BP (!) 145/65 (BP Location: Left Arm, Patient Position: Sitting, Cuff  Size: Large)   Pulse 70   Ht 5' 7.5" (1.715 m)   Wt 220 lb (99.8 kg)   SpO2 97%   BMI 33.95 kg/m    Physical Exam Vitals reviewed.  Constitutional:      General: She is not in acute distress.    Appearance: Normal appearance. She is well-developed. She is not diaphoretic.  HENT:     Head: Normocephalic and atraumatic.  Eyes:     General: No scleral icterus.    Conjunctiva/sclera: Conjunctivae normal.  Neck:     Thyroid: No thyromegaly.  Cardiovascular:     Rate and Rhythm: Normal rate and regular  rhythm.     Heart sounds: Normal heart sounds. No murmur heard. Pulmonary:     Effort: Pulmonary effort is normal. No respiratory distress.     Breath sounds: Normal breath sounds. No wheezing, rhonchi or rales.  Musculoskeletal:     Cervical back: Neck supple.     Right lower leg: No edema.     Left lower leg: No edema.  Lymphadenopathy:     Cervical: No cervical adenopathy.  Skin:    General: Skin is warm and dry.     Findings: No rash.  Neurological:     Mental Status: She is alert and oriented to person, place, and time. Mental status is at baseline.  Psychiatric:        Mood and Affect: Mood normal.        Behavior: Behavior normal.      No results found for any visits on 06/12/23.  Assessment & Plan     Problem List Items Addressed This Visit       Cardiovascular and Mediastinum   Chronic diastolic CHF (congestive heart failure) (HCC)   Relevant Orders   AMB Referral [facility] Care Management   Persistent atrial fibrillation (HCC) - Primary   Relevant Orders   AMB Referral [facility] Care Management   Other Visit Diagnoses       Benign paroxysmal positional vertigo, unspecified laterality           Assessment and Plan    Vertigo Intermittent vertigo, primarily in the morning upon sitting up, with dizziness and room spinning sensation. Initial treatment with Dramamine was not well tolerated due to sedative effects. Some improvement noted, but episodes persist. Discussed Epley maneuver as a non-pharmacological treatment option for repositioning inner ear crystals. - Provide Epley maneuver handout - Recommend meclizine as needed - Consider referral to a physical therapist specializing in vertigo if symptoms persist  Atrial Fibrillation CHADS-VASc score of 5, indicating high stroke risk. Currently on Eliquis for anticoagulation. Emphasized the necessity of continuing Eliquis for effective stroke prevention despite financial concerns. Discussed potential alternatives but  stressed Eliquis's importance. - Continue Eliquis - Discuss financial concerns with cardiology to explore alternatives or assistance programs - Refer to pharmacist for financial assistance options  Heart Failure Recently seen by heart failure specialist who discontinued bisoprolol and prescribed Farxiga. Patient has not started [place] due to cost concerns. Discussed Farxiga's benefits for heart failure management, including heart preventive effects and fluid status management. Emphasized medication adherence. - Discuss financial concerns with Dr. Elwyn Lade to prioritize medication - Refer to pharmacist for financial assistance options for [facility] Maintenance Routine follow-up and wellness check needed. - Schedule next regular visit in 4-6 months          Return in about 6 months (around 12/10/2023) for CPE.       Shirlee Latch, MD  [facility] [phone] (phone) [phone] (fax)  [facility]

## 2023-06-13 ENCOUNTER — Telehealth: Payer: Self-pay

## 2023-06-13 LAB — PROTEIN ELECTROPHORESIS, SERUM
A/G Ratio: 1.1 (ref 0.7–1.7)
Albumin ELP: 3.8 g/dL (ref 2.9–4.4)
Alpha 1: 0.3 g/dL (ref 0.0–0.4)
Alpha 2: 0.7 g/dL (ref 0.4–1.0)
Beta: 1.4 g/dL — ABNORMAL HIGH (ref 0.7–1.3)
Gamma Globulin: 1.1 g/dL (ref 0.4–1.8)
Globulin, Total: 3.5 g/dL (ref 2.2–3.9)
M-Spike, %: 0.5 g/dL — ABNORMAL HIGH
Total Protein: 7.3 g/dL (ref 6.0–8.5)

## 2023-06-13 LAB — KAPPA/LAMBDA LIGHT CHAINS
Ig Kappa Free Light Chain: 35.3 mg/L — ABNORMAL HIGH (ref 3.3–19.4)
Ig Lambda Free Light Chain: 29.9 mg/L — ABNORMAL HIGH (ref 5.7–26.3)
KAPPA/LAMBDA RATIO: 1.18 (ref 0.26–1.65)

## 2023-06-13 LAB — PROTEIN ELECTROPHORESIS, URINE REFLEX
Albumin ELP, Urine: 100 %
Alpha-1-Globulin, U: 0 %
Alpha-2-Globulin, U: 0 %
Beta Globulin, U: 0 %
Gamma Globulin, U: 0 %
Protein, Ur: 4 mg/dL

## 2023-06-13 LAB — IMMUNOFIXATION, SERUM
IgA/Immunoglobulin A, Serum: 347 mg/dL (ref 64–422)
IgG (Immunoglobin G), Serum: 1051 mg/dL (ref 586–1602)
IgM (Immunoglobulin M), Srm: 251 mg/dL — ABNORMAL HIGH (ref 26–217)

## 2023-06-13 NOTE — Progress Notes (Signed)
Care Guide Pharmacy Note  06/13/2023 Name: [name] MRN: [id number] DOB: [birth year]  Referred By: Erasmo Downer, MD Reason for referral: Care Coordination (Outreach to schedule with Pharm d )   Lauren Lloyd is a 83 y.o. year old female who is a primary care patient of Erasmo Downer, MD.  [name] was referred to the pharmacist for assistance related to: Atrial Fibrillation and CHF  Successful contact was made with the patient to discuss pharmacy services including being ready for the pharmacist to call at least 5 minutes before the scheduled appointment time and to have medication bottles and any blood pressure readings ready for review. The patient agreed to meet with the pharmacist via telephone visit on (date/time).06/22/2023  Penne Lash , RMA     [place]  [facility] Guide  Direct Dial: [phone]  Website: [email or web address]

## 2023-06-15 ENCOUNTER — Ambulatory Visit: Payer: Medicare Other

## 2023-06-19 ENCOUNTER — Encounter: Payer: Medicare Other | Attending: Cardiovascular Disease | Admitting: *Deleted

## 2023-06-19 DIAGNOSIS — I5032 Chronic diastolic (congestive) heart failure: Secondary | ICD-10-CM | POA: Diagnosis not present

## 2023-06-19 NOTE — Progress Notes (Signed)
Daily Session Note  Patient Details  Name: Lauren Lloyd MRN: [id number] Date of Birth: December 31, 1940 Referring Provider:   Flowsheet Row Pulmonary Rehab from 02/13/2023 in [facility] Cardiac and Pulmonary Rehab  Referring Provider Julien Nordmann, MD       Encounter Date: 06/19/2023  Check In:  Session Check In - 06/19/23 1546       Check-In   Supervising physician immediately available to respond to emergencies See telemetry face sheet for immediately available ER MD    Location [facility]-Cardiac & Pulmonary Rehab    Staff Present Maxon Conetta BS, Exercise Physiologist;Reason Helzer Jewel Baize RN,BSN;Joseph Reino Kent RCP,RRT,BSRT    Virtual Visit No    Medication changes reported     No    Fall or balance concerns reported    No    Warm-up and Cool-down Performed on first and last piece of equipment    Resistance Training Performed Yes    VAD Patient? No    PAD/SET Patient? No      Pain Assessment   Currently in Pain? No/denies                Social History   Tobacco Use  Smoking Status Former   Current packs/day: 0.00   Average packs/day: 1 pack/day for 30.0 years (30.0 ttl pk-yrs)   Types: Cigarettes   Start date: 05/17/1959   Quit date: 05/16/1989   Years since quitting: 34.1   Passive exposure: Past  Smokeless Tobacco Never    Goals Met:  Independence with exercise equipment Exercise tolerated well No report of concerns or symptoms today Strength training completed today  Goals Unmet:  Not Applicable  Comments: Pt able to follow exercise prescription today without complaint.  Will continue to monitor for progression.    Dr. Bethann Punches is Medical Director for [facility] Cardiac Rehabilitation.  Dr. Vida Rigger is Medical Director for [facility] Pulmonary Rehabilitation.

## 2023-06-20 ENCOUNTER — Ambulatory Visit: Payer: Medicare Other

## 2023-06-21 ENCOUNTER — Encounter: Payer: Medicare Other | Admitting: *Deleted

## 2023-06-21 DIAGNOSIS — I5032 Chronic diastolic (congestive) heart failure: Secondary | ICD-10-CM

## 2023-06-21 NOTE — Progress Notes (Signed)
 Daily Session Note  Patient Details  Name: Lauren Lloyd MRN: [id number] Date of Birth: 10/06/1940 Referring Provider:   Flowsheet Row Pulmonary Rehab from 02/13/2023 in [facility] Cardiac and Pulmonary Rehab  Referring Provider Evalene Lunger, MD       Encounter Date: 06/21/2023  Check In:  Session Check In - 06/21/23 1543       Check-In   Supervising physician immediately available to respond to emergencies See telemetry face sheet for immediately available ER MD    Location [facility]-Cardiac & Pulmonary Rehab    Staff Present Hoy Rodney RN,BSN;Joseph [facility] Dyane BS, ACSM CEP, Exercise Physiologist;Maxon Conetta BS, Exercise Physiologist    Virtual Visit No    Medication changes reported     No    Fall or balance concerns reported    No    Warm-up and Cool-down Performed on first and last piece of equipment    Resistance Training Performed Yes    VAD Patient? No    PAD/SET Patient? No      Pain Assessment   Currently in Pain? No/denies                Social History   Tobacco Use  Smoking Status Former   Current packs/day: 0.00   Average packs/day: 1 pack/day for 30.0 years (30.0 ttl pk-yrs)   Types: Cigarettes   Start date: 05/17/1959   Quit date: 05/16/1989   Years since quitting: 34.1   Passive exposure: Past  Smokeless Tobacco Never    Goals Met:  Independence with exercise equipment Exercise tolerated well No report of concerns or symptoms today Strength training completed today  Goals Unmet:  Not Applicable  Comments: Pt able to follow exercise prescription today without complaint.  Will continue to monitor for progression.    Dr. Oneil Pinal is Medical Director for [facility] Cardiac Rehabilitation.  Dr. Fuad Aleskerov is Medical Director for [facility] Pulmonary Rehabilitation.

## 2023-06-22 ENCOUNTER — Other Ambulatory Visit: Payer: Self-pay | Admitting: Pharmacist

## 2023-06-22 ENCOUNTER — Encounter: Payer: Medicare Other | Admitting: *Deleted

## 2023-06-22 ENCOUNTER — Other Ambulatory Visit (HOSPITAL_COMMUNITY): Payer: Self-pay

## 2023-06-22 ENCOUNTER — Ambulatory Visit: Payer: Medicare Other

## 2023-06-22 DIAGNOSIS — I5032 Chronic diastolic (congestive) heart failure: Secondary | ICD-10-CM

## 2023-06-22 NOTE — Progress Notes (Signed)
   06/22/2023  Patient ID: Lauren Lloyd, female   DOB: [birth year], 83 y.o.   MRN: [id number]  Called and spoke with the patient today regarding Eliquis  and Farxiga  PAP.   For Eliquis , patient reports not picking up this year yet from [facility]. She reports getting it from PAP previously. Advised to be enrolled, she would have to spend the 3% OOP Rx expense based on income first to qualify. Has NOT paid much for any prescriptions yet this year, so therefore would NOT qualify. Would likely be around $900 spent beforehand- would take her the entire year to meet amount.   Reports getting the Farxiga  from [facility] on 06/08/23 with help of a discount card. Will submit Farxiga  PAP application today- patient aware she will receive texts regarding the shipment.  Reports income of about $30,000 yearly.   Called pharmacy and they report having the patient on the phone with a pharmacist currently so not able to see Eliquis  pricing currently. Told patient she has a $375 drug deductible, so she may need to adjust the quantity of Eliquis  tablets to get it as close to $375 as possible. Every fill after that would be $45 monthly. Patient said she may have to wait until March to afford the deductible. Plans to discuss with Cardiology regarding concerns.    Aloysius Breeding, PharmD [facility] Phone Number: [phone]

## 2023-06-22 NOTE — Progress Notes (Signed)
 Daily Session Note  Patient Details  Name: Lauren Lloyd MRN: [id number] Date of Birth: [birth year] Referring Provider:   Flowsheet Row Pulmonary Rehab from 02/13/2023 in [facility] Cardiac and Pulmonary Rehab  Referring Provider Evalene Lunger, MD       Encounter Date: 06/22/2023  Check In:  Session Check In - 06/22/23 1550       Check-In   Supervising physician immediately available to respond to emergencies See telemetry face sheet for immediately available ER MD    Location [facility]-Cardiac & Pulmonary Rehab    Staff Present Maxon Conetta BS, Exercise Physiologist;Noah Tickle, BS, Exercise Physiologist;Ryelee Albee Tressa RN,BSN;Joseph Hood RCP,RRT,BSRT    Virtual Visit No    Medication changes reported     No    Fall or balance concerns reported    No    Warm-up and Cool-down Performed on first and last piece of equipment    Resistance Training Performed Yes    VAD Patient? No    PAD/SET Patient? No      Pain Assessment   Currently in Pain? No/denies                Social History   Tobacco Use  Smoking Status Former   Current packs/day: 0.00   Average packs/day: 1 pack/day for 30.0 years (30.0 ttl pk-yrs)   Types: Cigarettes   Start date: 05/17/1959   Quit date: 05/16/1989   Years since quitting: 34.1   Passive exposure: Past  Smokeless Tobacco Never    Goals Met:  Independence with exercise equipment Exercise tolerated well No report of concerns or symptoms today Strength training completed today  Goals Unmet:  Not Applicable  Comments: Pt able to follow exercise prescription today without complaint.  Will continue to monitor for progression.    Dr. Oneil Pinal is Medical Director for [facility] Cardiac Rehabilitation.  Dr. Fuad Aleskerov is Medical Director for [facility] Pulmonary Rehabilitation.

## 2023-06-26 ENCOUNTER — Encounter: Payer: Medicare Other | Admitting: *Deleted

## 2023-06-26 DIAGNOSIS — I5032 Chronic diastolic (congestive) heart failure: Secondary | ICD-10-CM

## 2023-06-26 NOTE — Progress Notes (Signed)
 Daily Session Note  Patient Details  Name: Lauren Lloyd MRN: [id number] Date of Birth: 05/26/1940 Referring Provider:   Flowsheet Row Pulmonary Rehab from 02/13/2023 in [facility] Cardiac and Pulmonary Rehab  Referring Provider Belva Boyden, MD       Encounter Date: 06/26/2023  Check In:  Session Check In - 06/26/23 1540       Check-In   Supervising physician immediately available to respond to emergencies See telemetry face sheet for immediately available ER MD    Location [facility]-Cardiac & Pulmonary Rehab    Staff Present Sue Em RN,BSN;Joseph Nancey Awkward;Maud Sorenson, RN, BSN, CCRP;Maxon Conetta BS, Exercise Physiologist    Virtual Visit No    Medication changes reported     No    Fall or balance concerns reported    No    Warm-up and Cool-down Performed on first and last piece of equipment    Resistance Training Performed Yes    VAD Patient? No    PAD/SET Patient? No      Pain Assessment   Currently in Pain? No/denies                Social History   Tobacco Use  Smoking Status Former   Current packs/day: 0.00   Average packs/day: 1 pack/day for 30.0 years (30.0 ttl pk-yrs)   Types: Cigarettes   Start date: 05/17/1959   Quit date: 05/16/1989   Years since quitting: 34.1   Passive exposure: Past  Smokeless Tobacco Never    Goals Met:  Independence with exercise equipment Exercise tolerated well No report of concerns or symptoms today Strength training completed today  Goals Unmet:  Not Applicable  Comments: Pt able to follow exercise prescription today without complaint.  Will continue to monitor for progression.    Dr. Firman Hughes is Medical Director for [facility] Cardiac Rehabilitation.  Dr. Fuad Aleskerov is Medical Director for [facility] Pulmonary Rehabilitation.

## 2023-06-27 ENCOUNTER — Ambulatory Visit: Payer: Medicare Other

## 2023-06-28 ENCOUNTER — Encounter: Payer: Self-pay | Admitting: *Deleted

## 2023-06-28 ENCOUNTER — Telehealth (HOSPITAL_COMMUNITY): Payer: Self-pay

## 2023-06-28 DIAGNOSIS — I5032 Chronic diastolic (congestive) heart failure: Secondary | ICD-10-CM

## 2023-06-28 NOTE — Telephone Encounter (Signed)
UEAV#[id number] valid 2/12-3/13 for PYP

## 2023-06-28 NOTE — Telephone Encounter (Signed)
-----   Message from Donnie Coffin sent at 06/19/2023  8:00 AM EST ----- Still pending to schedule. :) ----- Message ----- From: Minette Brine Sent: 06/05/2023   1:02 PM EST To: Chinita Pester, CMA  Please precert for ordered Amyloid.

## 2023-06-28 NOTE — Progress Notes (Signed)
Pulmonary Individual Treatment Plan  Patient Details  Name: Lauren Lloyd MRN: [id number] Date of Birth: Dec 08, 1940 Referring Provider:   Flowsheet Row Pulmonary Rehab from 02/13/2023 in [facility] Cardiac [facility] Re[facility]  Referring Provider Julien Nordmann, MD       Initial Encounter Date:  Flowsheet Row Pulmonary Rehab from 02/13/2023 in [facility] Cardiac and Pulmonary Rehab  Date 02/13/23       Visit Diagnosis: Heart failure, diastolic, chronic (HCC)  Patient's Home Medications on Admission:  Current Outpatient Medications:    acetaminophen (TYLENOL) 325 MG tablet, Take 650 mg by mouth every 6 (six) hours as needed for moderate pain., Disp: , Rfl:    allopurinol (ZYLOPRIM) 100 MG tablet, TAKE ONE TABLET BY MOUTH EVERY DAY, Disp: 90 tablet, Rfl: 1   calcium carbonate (TUMS EX) 750 MG chewable tablet, Chew 2 tablets by mouth daily as needed for heartburn., Disp: , Rfl:    Cholecalciferol 25 MCG (1000 UT) tablet, Take 1,000 Units by mouth daily., Disp: , Rfl:    dapagliflozin propanediol (FARXIGA) 10 MG TABS tablet, Take 1 tablet (10 mg total) by mouth daily before breakfast., Disp: 30 tablet, Rfl: 6   ELIQUIS 5 MG TABS tablet, TAKE ONE TABLET TWICE DAILY, Disp: 180 tablet, Rfl: 1   ezetimibe (ZETIA) 10 MG tablet, TAKE 1 TABLET BY MOUTH DAILY, Disp: 90 tablet, Rfl: 3   fluticasone (FLONASE) 50 MCG/ACT nasal spray, Place 2 sprays into both nostrils daily., Disp: 16 g, Rfl: 6   furosemide (LASIX) 40 MG tablet, TAKE 1 TABLET BY MOUTH DAILY. TAKE AN EXTRA TABLET AS NEEDED AFTER LUNCH FOR ABDOMINAL SWELLING, LEG SWELLING OR SHORTNESS OF BREATH, Disp: 180 tablet, Rfl: 1   gabapentin (NEURONTIN) 600 MG tablet, TAKE ONE TABLET BY MOUTH AT BEDTIME, Disp: 90 tablet, Rfl: 0   Magnesium 300 MG CAPS, Take by mouth., Disp: , Rfl:    meclizine (ANTIVERT) 25 MG tablet, Take 1 tablet (25 mg total) by mouth 3 (three) times daily as needed for dizziness. (Patient not taking: Reported on 06/12/2023), Disp: 30  tablet, Rfl: 1   Menthol, Topical Analgesic, (BIOFREEZE EX), Apply 1 application. topically daily as needed (Neck pain)., Disp: , Rfl:    metaxalone (SKELAXIN) 800 MG tablet, Take 1 tablet (800 mg total) by mouth daily as needed for muscle spasms., Disp: 30 tablet, Rfl: 2   Multiple Vitamin (MULTIVITAMIN) capsule, Take 1 capsule by mouth daily., Disp: , Rfl:    mupirocin ointment (BACTROBAN) 2 %, Place 1 application  into the nose 2 (two) times daily., Disp: 22 g, Rfl: 0   potassium chloride (KLOR-CON) 10 MEQ tablet, TAKE 1 TABLET BY MOUTH DAILY, Disp: 30 tablet, Rfl: 5   Tiotropium Bromide-Olodaterol (STIOLTO RESPIMAT) 2.5-2.5 MCG/ACT AERS, Inhale 2 puffs into the lungs daily., Disp: 4 g, Rfl: 0  Past Medical History: Past Medical History:  Diagnosis Date   (HFpEF) heart failure with preserved ejection fraction (HCC)    a. 05/2018 Echo: EF 55-60%, no rwma, mild to mod MR. Nl RV fxn. Mod TR. PASP .   Arthritis    knees, Hands   Arthritis of knee    Back pain    Carotid arterial disease (HCC)    a. 03/2019 Carotid U/S: <50% bilat ICA stenoses.   Cholelithiasis    a. 10/2018 noted on CT.   Edema, lower extremity    Fatty liver    GERD (gastroesophageal reflux disease)    History of stress test    a. 06/2018 MV:  EF 59%, no ischemia/infarct. Low risk.   Knee pain    Lactose intolerance    Mitral regurgitation    a. 05/2018 Echo: mild to mod MR.   Multinodular goiter    Obesity    OSA (obstructive sleep apnea)    PAF (paroxysmal atrial fibrillation) (HCC)    a.  Diagnosed 12/19; b. 05/2018 s/p DCCV; c. 03/2019 & 05/2019 recurrent AFib-->managed w/ amio load; d. CHADS2VASc = 6 (CHF, HTN, age x 2, vascular disease, female)-->Eliquis & amio 100 qd.   PAH (pulmonary artery hypertension) (HCC)    RSV (acute bronchiolitis due to respiratory syncytial virus) 05/10/2022   Scoliosis    SOB (shortness of breath)    Swallowing difficulty     Tobacco Use: Social History   Tobacco Use   Smoking Status Former   Current packs/day: 0.00   Average packs/day: 1 pack/day for 30.0 years (30.0 ttl pk-yrs)   Types: Cigarettes   Start date: 05/17/1959   Quit date: 05/16/1989   Years since quitting: 34.1   Passive exposure: Past  Smokeless Tobacco Never    Labs: Review Flowsheet  More data exists      Latest Ref Rng & Units 12/09/2019 08/03/2020 08/16/2021 03/17/2022 10/28/2022  Labs for ITP Cardiac and Pulmonary Rehab  Cholestrol 100 - 199 mg/dL 161  096  CANCELED  045  176   LDL (calc) 0 - 99 mg/dL 409  811  - 93  914   HDL-C >39 mg/dL 73  65  CANCELED  52  54   Trlycerides 0 - 149 mg/dL 782  956  CANCELED  94  126   Hemoglobin A1c 4.8 - 5.6 % 5.5  - - 6.0  6.0      Pulmonary Assessment Scores:  Pulmonary Assessment Scores     Row Name 02/13/23 1645         ADL UCSD   ADL Phase Entry     SOB Score total 57     Rest 0     Walk 3     Stairs 4     Bath 1     Dress 1     Shop 1       CAT Score   CAT Score 23       mMRC Score   mMRC Score 2              UCSD: Self-administered rating of dyspnea associated with activities of daily living (ADLs) 6-point scale (0 = "not at all" to 5 = "maximal or unable to do because of breathlessness")  Scoring Scores range from 0 to 120.  Minimally important difference is 5 units  CAT: CAT can identify the health impairment of COPD patients and is better correlated with disease progression.  CAT has a scoring range of zero to 40. The CAT score is classified into four groups of low (less than 10), medium (10 - 20), high (21-30) and very high (31-40) based on the impact level of disease on health status. A CAT score over 10 suggests significant symptoms.  A worsening CAT score could be explained by an exacerbation, poor medication adherence, poor inhaler technique, or progression of COPD or comorbid conditions.  CAT MCID is 2 points  mMRC: mMRC (Modified Medical Research Council) Dyspnea Scale is used to assess the degree of  baseline functional disability in patients of respiratory disease due to dyspnea. No minimal important difference is established. A decrease in score of 1 point or greater  is considered a positive change.   Pulmonary Function Assessment:  Pulmonary Function Assessment - 01/25/23 1416       Breath   Shortness of Breath Yes;Limiting activity             Exercise Target Goals: Exercise Program Goal: Individual exercise prescription set using results from initial 6 min walk test and THRR while considering  patient's activity barriers and safety.   Exercise Prescription Goal: Initial exercise prescription builds to 30-45 minutes a day of aerobic activity, 2-3 days per week.  Home exercise guidelines will be given to patient during program as part of exercise prescription that the participant will acknowledge.  Education: Aerobic Exercise: - Group verbal and visual presentation on the components of exercise prescription. Introduces F.I.T.T principle from ACSM for exercise prescriptions.  Reviews F.I.T.T. principles of aerobic exercise including progression. Written material given at graduation.   Education: Resistance Exercise: - Group verbal and visual presentation on the components of exercise prescription. Introduces F.I.T.T principle from ACSM for exercise prescriptions  Reviews F.I.T.T. principles of resistance exercise including progression. Written material given at graduation.    Education: Exercise & Equipment Safety: - Individual verbal instruction and demonstration of equipment use and safety with use of the equipment. Flowsheet Row Pulmonary Rehab from 04/27/2023 in [facility] Cardiac and Pulmonary Rehab  Date 02/13/23  Educator MB  Instruction Review Code 1- Verbalizes Understanding       Education: Exercise Physiology & General Exercise Guidelines: - Group verbal and written instruction with models to review the exercise physiology of the cardiovascular system and  associated critical values. Provides general exercise guidelines with specific guidelines to those with heart or lung disease.  Flowsheet Row Pulmonary Rehab from 04/27/2023 in [facility] Cardiac and Pulmonary Rehab  Education need identified 02/13/23       Education: Flexibility, Balance, Mind/Body Relaxation: - Group verbal and visual presentation with interactive activity on the components of exercise prescription. Introduces F.I.T.T principle from ACSM for exercise prescriptions. Reviews F.I.T.T. principles of flexibility and balance exercise training including progression. Also discusses the mind body connection.  Reviews various relaxation techniques to help reduce and manage stress (i.e. Deep breathing, progressive muscle relaxation, and visualization). Balance handout provided to take home. Written material given at graduation.   Activity Barriers & Risk Stratification:  Activity Barriers & Cardiac Risk Stratification - 02/13/23 1640       Activity Barriers & Cardiac Risk Stratification   Activity Barriers Arthritis;Back Problems;Shortness of Breath             6 Minute Walk:  6 Minute Walk     Row Name 02/13/23 1638         6 Minute Walk   Phase Initial     Distance 945 feet     Walk Time 6 minutes     # of Rest Breaks 0     MPH 1.79     METS 1.54     RPE 15     Perceived Dyspnea  3     VO2 Peak 5.41     Symptoms Yes (comment)     Comments back pain     Resting HR 70 bpm     Resting BP 130/74     Resting Oxygen Saturation  92 %     Exercise Oxygen Saturation  during 6 min walk 91 %     Max Ex. HR 111 bpm     Max Ex. BP 166/72     2 Minute Post  BP 150/74       Interval HR   1 Minute HR 103     2 Minute HR 107     3 Minute HR 109     4 Minute HR 111     5 Minute HR 82     6 Minute HR 83     2 Minute Post HR 70     Interval Heart Rate? Yes       Interval Oxygen   Interval Oxygen? Yes     Baseline Oxygen Saturation % 92 %     1 Minute Oxygen Saturation %  91 %     1 Minute Liters of Oxygen 0 L     2 Minute Oxygen Saturation % 92 %     2 Minute Liters of Oxygen 0 L     3 Minute Oxygen Saturation % 91 %     3 Minute Liters of Oxygen 0 L     4 Minute Oxygen Saturation % 91 %     4 Minute Liters of Oxygen 0 L     5 Minute Oxygen Saturation % 95 %     5 Minute Liters of Oxygen 0 L     6 Minute Oxygen Saturation % 93 %     6 Minute Liters of Oxygen 0 L     2 Minute Post Oxygen Saturation % 96 %     2 Minute Post Liters of Oxygen 0 L             Oxygen Initial Assessment:  Oxygen Initial Assessment - 01/25/23 1415       Home Oxygen   Home Oxygen Device None    Sleep Oxygen Prescription CPAP    Liters per minute --   off and on   Home Exercise Oxygen Prescription None    Home Resting Oxygen Prescription None    Compliance with Home Oxygen Use Yes      Initial 6 min Walk   Oxygen Used None      Program Oxygen Prescription   Program Oxygen Prescription None      Intervention   Short Term Goals To learn and exhibit compliance with exercise, home and travel O2 prescription;To learn and understand importance of monitoring SPO2 with pulse oximeter and demonstrate accurate use of the pulse oximeter.;To learn and understand importance of maintaining oxygen saturations>88%;To learn and demonstrate proper pursed lip breathing techniques or other breathing techniques. ;To learn and demonstrate proper use of respiratory medications    Long  Term Goals Exhibits compliance with exercise, home  and travel O2 prescription;Verbalizes importance of monitoring SPO2 with pulse oximeter and return demonstration;Maintenance of O2 saturations>88%;Exhibits proper breathing techniques, such as pursed lip breathing or other method taught during program session;Compliance with respiratory medication;Demonstrates proper use of MDI's             Oxygen Re-Evaluation:  Oxygen Re-Evaluation     Row Name 02/16/23 0813 03/02/23 0802 04/04/23 0850 05/30/23  0814 06/08/23 0844     Program Oxygen Prescription   Program Oxygen Prescription -- None None None None     Home Oxygen   Home Oxygen Device -- None None None None   Sleep Oxygen Prescription -- CPAP CPAP CPAP CPAP   Liters per minute -- -- --  off and on --  off and on --  off and on   Home Exercise Oxygen Prescription -- None None None None   Home Resting Oxygen Prescription -- None  None None None   Compliance with Home Oxygen Use -- No No No No     Goals/Expected Outcomes   Short Term Goals -- To learn and demonstrate proper pursed lip breathing techniques or other breathing techniques.  To learn and demonstrate proper pursed lip breathing techniques or other breathing techniques.  To learn and demonstrate proper pursed lip breathing techniques or other breathing techniques.  To learn and demonstrate proper pursed lip breathing techniques or other breathing techniques.    Long  Term Goals -- Exhibits proper breathing techniques, such as pursed lip breathing or other method taught during program session Exhibits proper breathing techniques, such as pursed lip breathing or other method taught during program session Exhibits proper breathing techniques, such as pursed lip breathing or other method taught during program session Exhibits proper breathing techniques, such as pursed lip breathing or other method taught during program session   Comments Reviewed PLB technique with pt.  Talked about how it works and it's importance in maintaining their exercise saturations. Informed patient how to perform the Pursed Lipped breathing technique. Told patient to Inhale through the nose and out the mouth with pursed lips to keep their airways open, help oxygenate them better, practice when at rest or doing strenuous activity. Patient Verbalizes understanding of technique and will work on and be reiterated during [facility]. Reviewed pursed lipped breathing with patient. She verbalized understanding and will  continue to work on it during rest and during exercise. Checked in with Deyonna to see how her pursed lipped breathing is going with rest and exercise after the goal set last review. She has been adding it in more when she is noticing shortness of breath, such as walking from the parking lot to rehab. Discussed to practice it each time she remembers to do pursed lipped breathing to build the habit. Shavelle is still working on her goal to use pursed lipped breathing at rest and exercise when she remembers and is short of breath. She has noticed she is only short of breath when walking and walking uphill especially.   Goals/Expected Outcomes Short: Become more profiecient at using PLB. Long: Become independent at using PLB. Short: use PLB with exertion. Long: use PLB on exertion proficiently and independently. Short: use pursed lipped breathing with rest and exercise. Long: use pursed lipped breathing independently with exercise. Short: Consistently use pursed lipped breathing with rest and exercise when she becomes short of breath. Long: use pursed lipped breathing independently with exercise and rest. Short: Consistently use pursed lipped breathing with rest and exercise. Long: use pursed lipped breathing independently with exercise and rest.            Oxygen Discharge (Final Oxygen Re-Evaluation):  Oxygen Re-Evaluation - 06/08/23 0844       Program Oxygen Prescription   Program Oxygen Prescription None      Home Oxygen   Home Oxygen Device None    Sleep Oxygen Prescription CPAP    Liters per minute --   off and on   Home Exercise Oxygen Prescription None    Home Resting Oxygen Prescription None    Compliance with Home Oxygen Use No      Goals/Expected Outcomes   Short Term Goals To learn and demonstrate proper pursed lip breathing techniques or other breathing techniques.     Long  Term Goals Exhibits proper breathing techniques, such as pursed lip breathing or other method taught during  program session    Comments Hula is still working on  her goal to use pursed lipped breathing at rest and exercise when she remembers and is short of breath. She has noticed she is only short of breath when walking and walking uphill especially.    Goals/Expected Outcomes Short: Consistently use pursed lipped breathing with rest and exercise. Long: use pursed lipped breathing independently with exercise and rest.             Initial Exercise Prescription:  Initial Exercise Prescription - 02/13/23 1600       Date of Initial Exercise RX and Referring Provider   Date 02/13/23    Referring Provider Julien Nordmann, MD      Oxygen   Maintain Oxygen Saturation 88% or higher      Recumbant Bike   Level 1    RPM 50    Watts 14    Minutes 15    METs 1.54      NuStep   Level 1    SPM 80    Minutes 15    METs 1.54      Biostep-RELP   Level 1    SPM 50    Minutes 15    METs 1.54      Track   Laps 8    Minutes 15    METs 1.44      Prescription Details   Frequency (times per week) 2    Duration Progress to 30 minutes of continuous aerobic without signs/symptoms of physical distress      Intensity   THRR 40-80% of Max Heartrate 96-123    Ratings of Perceived Exertion 11-13    Perceived Dyspnea 0-4      Progression   Progression Continue to progress workloads to maintain intensity without signs/symptoms of physical distress.      Resistance Training   Training Prescription Yes    Weight 5lb    Reps 10-15             Perform Capillary Blood Glucose checks as needed.  Exercise Prescription Changes:   Exercise Prescription Changes     Row Name 02/13/23 1600 02/22/23 1100 03/09/23 0800 03/22/23 1600 04/06/23 1500     Response to Exercise   Blood Pressure (Admit) 130/74 146/74 164/82 140/70 150/74   Blood Pressure (Exercise) 166/72 116/70 148/62 134/60 122/60   Blood Pressure (Exit) 130/74 130/76 120/58 128/56 126/64   Heart Rate (Admit) 70 bpm 70 bpm 72 bpm  75 bpm 91 bpm   Heart Rate (Exercise) 111 bpm 81 bpm 123 bpm 128 bpm 91 bpm   Heart Rate (Exit) 70 bpm 70 bpm 72 bpm 70 bpm 77 bpm   Oxygen Saturation (Admit) 92 % 96 % 99 % 99 % 91 %   Oxygen Saturation (Exercise) 91 % 92 % 92 % 92 % 91 %   Oxygen Saturation (Exit) 94 % 95 % 95 % 97 % 96 %   Rating of Perceived Exertion (Exercise) 15 14 13 14 13    Perceived Dyspnea (Exercise) 3 3 2 1  0   Symptoms back pain none none none knee pain   Comments results -- -- -- --   Duration Progress to 30 minutes of  aerobic without signs/symptoms of physical distress Progress to 30 minutes of  aerobic without signs/symptoms of physical distress Progress to 30 minutes of  aerobic without signs/symptoms of physical distress Progress to 30 minutes of  aerobic without signs/symptoms of physical distress Progress to 30 minutes of  aerobic without signs/symptoms of physical distress  Intensity THRR New THRR unchanged THRR unchanged THRR unchanged THRR unchanged     Progression   Progression Continue to progress workloads to maintain intensity without signs/symptoms of physical distress. Continue to progress workloads to maintain intensity without signs/symptoms of physical distress. Continue to progress workloads to maintain intensity without signs/symptoms of physical distress. Continue to progress workloads to maintain intensity without signs/symptoms of physical distress. Continue to progress workloads to maintain intensity without signs/symptoms of physical distress.   Average METs 1.54 2.05 1.97 2.07 2.78     Resistance Training   Training Prescription -- Yes Yes Yes Yes   Weight -- 5lb 5lb 5lb 5lb   Reps -- 10-15 10-15 10-15 10-15     Interval Training   Interval Training -- No No No No     Treadmill   MPH -- -- -- 1.4 --   Grade -- -- -- 0 --   Minutes -- -- -- 15 --   METs -- -- -- 2.07 --     Recumbant Bike   Level -- -- -- -- 1   Watts -- -- -- -- 14   Minutes -- -- -- -- 15   METs -- --  -- -- 2.45     NuStep   Level -- -- 2 2 3    Minutes -- -- 15 15 15    METs -- -- 2.1 2.6 3.1     Biostep-RELP   Level -- 1 1 1  --   Minutes -- 15 15 15  --   METs -- 2 2 2  --     Track   Laps -- 20  Hallway 15  Hallway 17  hallway --   Minutes -- 15 15 15  --   METs -- 2.09 1.82 1.92 --     Oxygen   Maintain Oxygen Saturation -- 88% or higher 88% or higher 88% or higher 88% or higher    Row Name 04/17/23 1100 04/20/23 0700 05/04/23 1400 05/18/23 1600 05/29/23 1600     Response to Exercise   Blood Pressure (Admit) 142/72 -- 148/80 128/68 128/68   Blood Pressure (Exercise) 158/80 -- -- -- --   Blood Pressure (Exit) 144/70 -- 128/60 130/62 130/62   Heart Rate (Admit) 79 bpm -- 73 bpm 82 bpm 82 bpm   Heart Rate (Exercise) 93 bpm -- 91 bpm 91 bpm 91 bpm   Heart Rate (Exit) 72 bpm -- 80 bpm 90 bpm 90 bpm   Oxygen Saturation (Admit) 100 % -- 94 % 93 % 93 %   Oxygen Saturation (Exercise) 93 % -- 95 % 93 % 93 %   Oxygen Saturation (Exit) 95 % -- 95 % 96 % 96 %   Rating of Perceived Exertion (Exercise) 15 -- 15 13 13    Perceived Dyspnea (Exercise) 0 -- 3 0 0   Symptoms none -- none none none   Duration Progress to 30 minutes of  aerobic without signs/symptoms of physical distress -- Progress to 30 minutes of  aerobic without signs/symptoms of physical distress Progress to 30 minutes of  aerobic without signs/symptoms of physical distress Progress to 30 minutes of  aerobic without signs/symptoms of physical distress   Intensity THRR unchanged -- THRR unchanged THRR unchanged THRR unchanged     Progression   Progression Continue to progress workloads to maintain intensity without signs/symptoms of physical distress. -- Continue to progress workloads to maintain intensity without signs/symptoms of physical distress. Continue to progress workloads to maintain intensity without signs/symptoms of physical  distress. Continue to progress workloads to maintain intensity without signs/symptoms of  physical distress.   Average METs 2.7 -- 2.34 2.74 2.74     Resistance Training   Training Prescription Yes -- Yes Yes Yes   Weight 5lb -- 6 lb 6 lb 6 lb   Reps 10-15 -- 10-15 10-15 10-15     Interval Training   Interval Training No -- No No No     Treadmill   MPH 1.5 -- 1.7 1.3 1.3   Grade 0 -- 0 0.5 0.5   Minutes 15 -- 15 15 15    METs 2.15 -- 2.3 2.08 2.08     Recumbant Bike   Level 1.5 -- 1 -- --   Watts 14 -- 14 -- --   Minutes 15 -- 15 -- --   METs 2.45 -- 2.45 -- --     NuStep   Level 4 -- 3 4 4    Minutes 15 -- 15 15 15    METs 3.6 -- 3.2 3.2 3.2     Biostep-RELP   Level 2 -- 3 -- --   Minutes 15 -- 15 -- --   METs -- -- 2 -- --     Track   Laps 17 -- 17 -- --   Minutes 15 -- 15 -- --   METs 1.92 -- 1.92 -- --     Home Exercise Plan   Plans to continue exercise at -- Home (comment)  Kendy plans to try walking for 30 minutes twice a week, outside at home. She struggles walking for 15 minutes in the program but states she can break up the home ex. to 15 minute intervals. Home (comment)  Lavell plans to try walking for 30 minutes twice a week, outside at home. She struggles walking for 15 minutes in the program but states she can break up the home ex. to 15 minute intervals. Home (comment)  Evamaria plans to try walking for 30 minutes twice a week, outside at home. She struggles walking for 15 minutes in the program but states she can break up the home ex. to 15 minute intervals. Home (comment)  Daci plans to try walking for 30 minutes twice a week, outside at home. She struggles walking for 15 minutes in the program but states she can break up the home ex. to 15 minute intervals.   Frequency -- Add 2 additional days to program exercise sessions. Add 2 additional days to program exercise sessions. Add 2 additional days to program exercise sessions. Add 2 additional days to program exercise sessions.   Initial Home Exercises Provided -- 04/20/23 04/20/23 04/20/23 04/20/23      Oxygen   Maintain Oxygen Saturation 88% or higher 88% or higher 88% or higher 88% or higher 88% or higher    Row Name 06/12/23 1700 06/28/23 1100           Response to Exercise   Blood Pressure (Admit) 162/78 140/62      Blood Pressure (Exit) 122/64 152/62      Heart Rate (Admit) 86 bpm 70 bpm      Heart Rate (Exercise) 78 bpm 99 bpm      Heart Rate (Exit) 81 bpm 78 bpm      Oxygen Saturation (Admit) 92 % 96 %      Oxygen Saturation (Exercise) 93 % 88 %      Oxygen Saturation (Exit) 95 % 95 %      Rating of Perceived Exertion (Exercise) 13  13      Perceived Dyspnea (Exercise) 1 1      Symptoms none none      Duration Continue with 30 min of aerobic exercise without signs/symptoms of physical distress. Continue with 30 min of aerobic exercise without signs/symptoms of physical distress.      Intensity THRR unchanged THRR unchanged        Progression   Progression Continue to progress workloads to maintain intensity without signs/symptoms of physical distress. Continue to progress workloads to maintain intensity without signs/symptoms of physical distress.      Average METs 3.18 2.8        Resistance Training   Training Prescription Yes Yes      Weight 6 lb 6 lb      Reps 10-15 10-15        Interval Training   Interval Training No No        Treadmill   MPH -- 1.9      Grade -- 0.5      Minutes -- 15      METs -- 2.59        NuStep   Level 4 4      Minutes 30 30      METs 3.5 3.1        T5 Nustep   Level -- 3  T6      Minutes -- 15      METs -- 2.9        Home Exercise Plan   Plans to continue exercise at Home (comment)  Courtni plans to try walking for 30 minutes twice a week, outside at home. She struggles walking for 15 minutes in the program but states she can break up the home ex. to 15 minute intervals. Home (comment)  George plans to try walking for 30 minutes twice a week, outside at home. She struggles walking for 15 minutes in the program but states she can  break up the home ex. to 15 minute intervals.      Frequency Add 2 additional days to program exercise sessions. Add 2 additional days to program exercise sessions.      Initial Home Exercises Provided 04/20/23 04/20/23        Oxygen   Maintain Oxygen Saturation 88% or higher 88% or higher               Exercise Comments:   Exercise Comments     Row Name 02/16/23 4098           Exercise Comments First full day of exercise!  Patient was oriented to gym and equipment including functions, settings, policies, and procedures.  Patient's individual exercise prescription and treatment plan were reviewed.  All starting workloads were established based on the results of the 6 minute walk test done at initial orientation visit.  The plan for exercise progression was also introduced and progression will be customized based on patient's performance and goals.                Exercise Goals and Review:   Exercise Goals     Row Name 02/13/23 1644             Exercise Goals   Increase Physical Activity Yes       Intervention Provide advice, education, support and counseling about physical activity/exercise needs.;Develop an individualized exercise prescription for aerobic and resistive training based on initial evaluation findings, risk stratification, comorbidities and participant's personal goals.  Expected Outcomes Short Term: Attend rehab on a regular basis to increase amount of physical activity.;Long Term: Exercising regularly at least 3-5 days a week.;Long Term: Add in home exercise to make exercise part of routine and to increase amount of physical activity.       Increase Strength and Stamina Yes       Intervention Provide advice, education, support and counseling about physical activity/exercise needs.;Develop an individualized exercise prescription for aerobic and resistive training based on initial evaluation findings, risk stratification, comorbidities and participant's  personal goals.       Expected Outcomes Short Term: Increase workloads from initial exercise prescription for resistance, speed, and METs.;Short Term: Perform resistance training exercises routinely during rehab and add in resistance training at home;Long Term: Improve cardiorespiratory fitness, muscular endurance and strength as measured by increased METs and functional capacity ( )       Able to understand and use rate of perceived exertion (RPE) scale Yes       Intervention Provide education and explanation on how to use RPE scale       Expected Outcomes Short Term: Able to use RPE daily in rehab to express subjective intensity level;Long Term:  Able to use RPE to guide intensity level when exercising independently       Able to understand and use Dyspnea scale Yes       Intervention Provide education and explanation on how to use Dyspnea scale       Expected Outcomes Short Term: Able to use Dyspnea scale daily in rehab to express subjective sense of shortness of breath during exertion;Long Term: Able to use Dyspnea scale to guide intensity level when exercising independently       Knowledge and understanding of Target Heart Rate Range (THRR) Yes       Intervention Provide education and explanation of THRR including how the numbers were predicted and where they are located for reference       Expected Outcomes Short Term: Able to state/look up THRR;Long Term: Able to use THRR to govern intensity when exercising independently;Short Term: Able to use daily as guideline for intensity in rehab       Able to check pulse independently Yes       Intervention Provide education and demonstration on how to check pulse in carotid and radial arteries.;Review the importance of being able to check your own pulse for safety during independent exercise       Expected Outcomes Short Term: Able to explain why pulse checking is important during independent exercise;Long Term: Able to check pulse independently and  accurately       Understanding of Exercise Prescription Yes       Intervention Provide education, explanation, and written materials on patient's individual exercise prescription       Expected Outcomes Short Term: Able to explain program exercise prescription;Long Term: Able to explain home exercise prescription to exercise independently                Exercise Goals Re-Evaluation :  Exercise Goals Re-Evaluation     Row Name 02/16/23 0811 02/22/23 1122 03/09/23 0855 03/22/23 1617 04/06/23 1552     Exercise Goal Re-Evaluation   Exercise Goals Review Able to understand and use rate of perceived exertion (RPE) scale;Able to understand and use Dyspnea scale;Knowledge and understanding of Target Heart Rate Range (THRR);Understanding of Exercise Prescription Increase Physical Activity;Increase Strength and Stamina;Understanding of Exercise Prescription Increase Physical Activity;Increase Strength and Stamina;Understanding of Exercise Prescription Increase Physical Activity;Increase  Strength and Stamina;Understanding of Exercise Prescription Increase Physical Activity;Increase Strength and Stamina;Understanding of Exercise Prescription   Comments Reviewed RPE and dyspnea scale, THR and program prescription with pt today.  Pt voiced understanding and was given a copy of goals to take home. Ellenor is off to a good start in the program. She has only attended one session during this review. She was able to increase her track laps from 8 to 20 in 15 minutes, and used the biostep at level 1. We will continue to monitor her progress in the program. Brittny is doing well in rehab. She recently was able to increase her level on the T4 nustep from level 1 to 2. She has maintained her intensity on the biostep at level 1. We will continue to monitor her progress in the program. Mirinda continues to do well in rehab. She was able to walk 17 laps in the hallway in 15 minutes. She also used the treadmill at a workload of  1.4 mph and 0% grade. We will continue to monitor her progress in the program. Debroh continues to do well in rehab. She has only attended one session since the time of this review. During this session she was able to increase her level on the T4 nustep from level 2 to 3. She also was able to maintain an intensity of level 1 on the recumbent bike. We will continue to monitor her progress in the program.   Expected Outcomes Short: Use RPE daily to regulate intensity. Long: Follow program prescription in THR. Short: Continue to follow current exercise prescription, and progressively increase workloads. Long: Continue exercise to improve strength and stamina. Short: Continue to follow current exercise prescription, and progressively increase workloads. Long: Continue exercise to improve strength and stamina. Short: Continue to follow current exercise prescription, and progressively increase workloads. Long: Continue exercise to improve strength and stamina. Short: Continue to follow current exercise prescription, and progressively increase workloads. Long: Continue exercise to improve strength and stamina.    Row Name 04/17/23 1152 04/20/23 0749 05/02/23 0759 05/04/23 1427 05/18/23 1626     Exercise Goal Re-Evaluation   Exercise Goals Review Increase Physical Activity;Increase Strength and Stamina;Understanding of Exercise Prescription Understanding of Exercise Prescription;Able to understand and use Dyspnea scale;Increase Physical Activity;Knowledge and understanding of Target Heart Rate Range (THRR);Increase Strength and Stamina;Able to check pulse independently;Able to understand and use rate of perceived exertion (RPE) scale Increase Physical Activity;Able to check pulse independently;Knowledge and understanding of Target Heart Rate Range (THRR) Increase Physical Activity;Understanding of Exercise Prescription;Increase Strength and Stamina Increase Physical Activity;Understanding of Exercise  Prescription;Increase Strength and Stamina   Comments Zykeria continues to do well in rehab. She has been able to increase her level on the T4 nustep from level 3 to level 4. She was also able to increase her level on the Biostep from level 1 to level 2. We will continue to monitor her progress in the program. Reviewed home exercise with pt today from 7:35 to 7:45.  Pt plans to walk outside at home, for a target of 30 minutes, but will break the time up into 15 min intervals. Reviewed THR, pulse, RPE, sign and symptoms, pulse oximetery and when to call 911 or MD.  Also discussed weather considerations and indoor options.  Pt voiced understanding. Aubre is doing well in the program and understands what her home exercise prescription is. She states that this month has been a little too cold and just generally has too much going on to start  her at home exercise. We will continue to check in on her to monitor her progress. Ludivina continues to do well in rehab. She increased her treadmill workload to 1.7 mph with no incline, and increased to 17 laps walked on the track. She also improved to level 3 on the biostep and increased to 6 lb hand weights for resistance training. We will continue to monitor her progress in the program. Alene has only attended two sessions of rehab since the last review. She decreased her treadmill workload to a speed of 1.3 mph with a 0.5% incline. She also improved back up to level 4 on the T4 nustep. We will continue to monitor her progress in the program.   Expected Outcomes Short: Continue to follow current exercise prescription, and progressively increase workloads. Long: Continue exercise to improve strength and stamina. Short: Try to implement home exercise. Long: Continue exercise to improve strength and stamina. Short: Try to implement home exercise. Long: Continue exercise to improve strength and stamina. Short: Continue to progressively increase treadmill workload and push for more  laps on the track. Long: Continue exercise to improve strength and stamina. Short: Increase treadmill workload back up to previous level. Long: Continue exercise to improve strength and stamina.    Row Name 05/29/23 1614 05/30/23 0809 06/08/23 0841 06/12/23 1712 06/28/23 1136     Exercise Goal Re-Evaluation   Exercise Goals Review Increase Physical Activity;Understanding of Exercise Prescription;Increase Strength and Stamina Increase Physical Activity;Increase Strength and Stamina Increase Physical Activity;Increase Strength and Stamina;Understanding of Exercise Prescription Increase Physical Activity;Increase Strength and Stamina;Understanding of Exercise Prescription Increase Physical Activity;Increase Strength and Stamina;Understanding of Exercise Prescription   Comments Farin has not attended the program since the last review. We will be in touch with her to determine why she is out and determine when she will resume. Wonder returned to the program today and we checked in on her home exercise. She has been trying to add home exercise back in after the holidays and her recent visit to the ED. She has inherited an exercise bike and has begun using that for some aerobic exericse at home as it is similar exercise that she does in rehab here. As she gets back into a routine she hopes to add more home exercise. Ramiah walks at home for part of her home exercise. She has been unable to walk this week due to the weather because she also could not get her car out to go walk at the mall. She has also been dizzy recently which has impacted her being consistent with exercising. Shes hoping to get back to it next week. Aster is doing well in rehab. She has maintained level 4 on the T4 nustep. She only used the T4 nustep in this review and we will encourage her to use different modalities for aerobic exercise. We will continue to monitor her progress in the program. Katara continues to do well in rehab. She was able to  increase her speed on the Treadmill from 1. to 1.14mph. She was also able to use the T6 nustep for the first time at level 3. We will coontinue to monitor her progress in the program.   Expected Outcomes Short: Return to rehab program. Long: Attend rehab program consistently for exercise benefits. Short: Add home exercise with her new exercise bike into her routine consistently. Long: Continue to exercise at home independently to improve strength and stamina. Short: Add walking back into her home exercise consistently. Long: Continue to exercise at  home independently to improve strength and stamina. Short: Add in another exercise modality for aerobic exercise besides the T4 nustep.  Long: Continue exercise to improve strength and stamina. Short: Continue to use and improve workloads on the treadmill. Long: Continue exercise to improve strength and stamina.            Discharge Exercise Prescription (Final Exercise Prescription Changes):  Exercise Prescription Changes - 06/28/23 1100       Response to Exercise   Blood Pressure (Admit) 140/62    Blood Pressure (Exit) 152/62    Heart Rate (Admit) 70 bpm    Heart Rate (Exercise) 99 bpm    Heart Rate (Exit) 78 bpm    Oxygen Saturation (Admit) 96 %    Oxygen Saturation (Exercise) 88 %    Oxygen Saturation (Exit) 95 %    Rating of Perceived Exertion (Exercise) 13    Perceived Dyspnea (Exercise) 1    Symptoms none    Duration Continue with 30 min of aerobic exercise without signs/symptoms of physical distress.    Intensity THRR unchanged      Progression   Progression Continue to progress workloads to maintain intensity without signs/symptoms of physical distress.    Average METs 2.8      Resistance Training   Training Prescription Yes    Weight 6 lb    Reps 10-15      Interval Training   Interval Training No      Treadmill   MPH 1.9    Grade 0.5    Minutes 15    METs 2.59      NuStep   Level 4    Minutes 30    METs 3.1       T5 Nustep   Level 3   T6   Minutes 15    METs 2.9      Home Exercise Plan   Plans to continue exercise at Home (comment)   Samreen plans to try walking for 30 minutes twice a week, outside at home. She struggles walking for 15 minutes in the program but states she can break up the home ex. to 15 minute intervals.   Frequency Add 2 additional days to program exercise sessions.    Initial Home Exercises Provided 04/20/23      Oxygen   Maintain Oxygen Saturation 88% or higher             Nutrition:  Target Goals: Understanding of nutrition guidelines, daily intake of sodium 1500mg , cholesterol 200mg , calories 30% from fat and 7% or less from saturated fats, daily to have 5 or more servings of fruits and vegetables.  Education: All About Nutrition: -Group instruction provided by verbal, written material, interactive activities, discussions, models, and posters to present general guidelines for heart healthy nutrition including fat, fiber, MyPlate, the role of sodium in heart healthy nutrition, utilization of the nutrition label, and utilization of this knowledge for meal planning. Follow up email sent as well. Written material given at graduation.   Biometrics:  Pre Biometrics - 02/13/23 1644       Pre Biometrics   Height 5' 6.2" (1.681 m)    Weight 216 lb 1.6 oz (98 kg)    Waist Circumference 45.5 inches    Hip Circumference 50 inches    Waist to Hip Ratio 0.91 %    BMI (Calculated) 34.69    Single Leg Stand 1.5 seconds              Nutrition  Therapy Plan and Nutrition Goals:  Nutrition Therapy & Goals - 02/16/23 0947       Nutrition Therapy   Diet Cardiac, low na    Protein (specify units) 90    Fiber 25 grams    Whole Grain Foods 3 servings    Saturated Fats 15 max. grams    Fruits and Vegetables 5 servings/day    Sodium 2 grams      Personal Nutrition Goals   Nutrition Goal Look into healthy snacks to help be consistent in not missing meals     Personal Goal #2 Eat a protein at every meals    Comments Patient drinking 24oz of water daily, has been drinking this much to avoid fluid on her chest, making it hard to breath. Spoke to her about watching her sodium intake as well. She reads labels and is knowledgeable of basics on nutrition. Reviewed Mediterranean diet handout, educated on types of fats, sources, and how to read them on label. Encouraged more veggies at larger meals or when eating poor food choices like fried chicken. She reports that sometimes she gets busy and misses meals. Recommended several smaller meals and snacks with quick grab and go friendly foods to try and be more consistent. Overall, she is doing well and is knowledgeable of how to build balanced plates      Intervention Plan   Intervention Prescribe, educate and counsel regarding individualized specific dietary modifications aiming towards targeted core components such as weight, hypertension, lipid management, diabetes, heart failure and other comorbidities.;Nutrition handout(s) given to patient.    Expected Outcomes Short Term Goal: Understand basic principles of dietary content, such as calories, fat, sodium, cholesterol and nutrients.;Short Term Goal: A plan has been developed with personal nutrition goals set during dietitian appointment.;Long Term Goal: Adherence to prescribed nutrition plan.             Nutrition Assessments:  MEDIFICTS Score Key: >=70 Need to make dietary changes  40-70 Heart Healthy Diet <= 40 Therapeutic Level Cholesterol Diet  Flowsheet Row Pulmonary Rehab from 02/13/2023 in [facility]  Picture Your Plate Total Score on Admission 57      Picture Your Plate Scores: <47 Unhealthy dietary pattern with much room for improvement. 41-50 Dietary pattern unlikely to meet recommendations for good health and room for improvement. 51-60 More healthful dietary pattern, with some room for improvement.  >60 Healthy  dietary pattern, although there may be some specific behaviors that could be improved.   Nutrition Goals Re-Evaluation:  Nutrition Goals Re-Evaluation     Row Name 03/02/23 0804 04/04/23 0900 05/02/23 0806 05/30/23 0825 06/08/23 0849     Goals   Current Weight 218 lb (98.9 kg) -- 217 lb (98.4 kg) 219 lb 4.8 oz (99.5 kg) 219 lb 9.6 oz (99.6 kg)   Nutrition Goal -- -- Look into healthy snacks to help be consistent in not missing meals Look into healthy snacks to help be consistent in not missing meals Look into healthy snacks to help be consistent in not missing meals   Comment Patient was informed on why it is important to maintain a balanced diet when dealing with Respiratory issues. Explained that it takes a lot of energy to breath and when they are short of breath often they will need to have a good diet to help keep up with the calories they are expending for breathing. Patient is attempting nutrition goals from RD of eating a protein in every meal and choosing  healthy snacks. She is not consistent with it. Patient is attempting nutrition goals from RD of eating a protein in every meal and choosing healthy snacks. She is not consistent with it. Denina is attempting her nutrition goals from RD, but is not consistent with it due to the holidays and funeral events for her brother in law. She has had someone in her house since 12/10, making it hard for her to work on her nutrition goals as there was more unhealthy foods around. She is working on adding more protein at every meal, but is having trouble with healthy snacks as she needs to find time to plan her meals. She also wants to cut down on her wine consumption and we discussed substiuting wine with another drink such as sparkling water when she wants a drink as a way to cut down. Anahi has been doing better with her nutrition goals as she has not been out with the cold and snowy weather to get junk food, and has been eating her healthy snacks and  protein she bought. She states she has been eating more food/portion size due to the cold weather.   Expected Outcome Short: Choose and plan snacks accordingly to patients caloric intake to improve breathing. Long: Maintain a diet independently that meets their caloric intake to aid in daily shortness of breath. Short: Continue to choose and plan healthy snacks andhave a protein every meal. Long: mantain a diet independently that meets caloric intake and includes these healthy snacks and protein. Short: Continue to choose and plan healthy snacks andhave a protein every meal. Long: mantain a diet independently that meets caloric intake and includes these healthy snacks and protein. Short: Plan healthy snacks and have a protein every meal as family has left from the holidays. Long: mantain a diet independently that meets caloric intake and includes these healthy snacks and protein. Short: Continue to get healthy snacks and have a protein every meal and reduce unhealthy choices. Long: mantain a diet independently that meets caloric intake and includes these healthy snacks and protein.     Personal Goal #2 Re-Evaluation   Personal Goal #2 -- -- -- Eat a protein at every meals Eat a protein at every meals            Nutrition Goals Discharge (Final Nutrition Goals Re-Evaluation):  Nutrition Goals Re-Evaluation - 06/08/23 0849       Goals   Current Weight 219 lb 9.6 oz (99.6 kg)    Nutrition Goal Look into healthy snacks to help be consistent in not missing meals    Comment Kristalyn has been doing better with her nutrition goals as she has not been out with the cold and snowy weather to get junk food, and has been eating her healthy snacks and protein she bought. She states she has been eating more food/portion size due to the cold weather.    Expected Outcome Short: Continue to get healthy snacks and have a protein every meal and reduce unhealthy choices. Long: mantain a diet independently that meets  caloric intake and includes these healthy snacks and protein.      Personal Goal #2 Re-Evaluation   Personal Goal #2 Eat a protein at every meals             Psychosocial: Target Goals: Acknowledge presence or absence of significant depression and/or stress, maximize coping skills, provide positive support system. Participant is able to verbalize types and ability to use techniques and skills needed for reducing stress  and depression.   Education: Stress, Anxiety, and Depression - Group verbal and visual presentation to define topics covered.  Reviews how body is impacted by stress, anxiety, and depression.  Also discusses healthy ways to reduce stress and to treat/manage anxiety and depression.  Written material given at graduation.   Education: Sleep Hygiene -Provides group verbal and written instruction about how sleep can affect your health.  Define sleep hygiene, discuss sleep cycles and impact of sleep habits. Review good sleep hygiene tips.    Initial Review & Psychosocial Screening:  Initial Psych Review & Screening - 01/25/23 1419       Initial Review   Current issues with None Identified      Family Dynamics   Good Support System? Yes    Comments She can look to her two sons and a daughter that lives here. She lives alone but is able to call her family if she needs help.      Barriers   Psychosocial barriers to participate in program The patient should benefit from training in stress management and relaxation.;There are no identifiable barriers or psychosocial needs.      Screening Interventions   Interventions Encouraged to exercise;To provide support and resources with identified psychosocial needs;Provide feedback about the scores to participant    Expected Outcomes Short Term goal: Utilizing psychosocial counselor, staff and physician to assist with identification of specific Stressors or current issues interfering with healing process. Setting desired goal for  each stressor or current issue identified.;Long Term Goal: Stressors or current issues are controlled or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

## 2023-06-29 ENCOUNTER — Ambulatory Visit: Payer: Medicare Other

## 2023-06-29 ENCOUNTER — Encounter: Payer: Medicare Other | Admitting: *Deleted

## 2023-06-29 DIAGNOSIS — I5032 Chronic diastolic (congestive) heart failure: Secondary | ICD-10-CM | POA: Diagnosis not present

## 2023-06-29 NOTE — Progress Notes (Signed)
Daily Session Note  Patient Details  Name: Lauren Lloyd MRN: [id number] Date of Birth: 11/12/1940 Referring Provider:   Flowsheet Row Pulmonary Rehab from 02/13/2023 in [facility] Cardiac and Pulmonary Rehab  Referring Provider Julien Nordmann, MD       Encounter Date: 06/29/2023  Check In:  Session Check In - 06/29/23 1544       Check-In   Supervising physician immediately available to respond to emergencies See telemetry face sheet for immediately available ER MD    Location [facility]-Cardiac & Pulmonary Rehab    Staff Present Susann Givens RN,BSN;[facility] BS, Exercise Physiologist;Noah Tickle, BS, Exercise Physiologist    Virtual Visit No    Medication changes reported     No    Fall or balance concerns reported    No    Warm-up and Cool-down Performed on first and last piece of equipment    Resistance Training Performed Yes    VAD Patient? No      Pain Assessment   Currently in Pain? No/denies                Social History   Tobacco Use  Smoking Status Former   Current packs/day: 0.00   Average packs/day: 1 pack/day for 30.0 years (30.0 ttl pk-yrs)   Types: Cigarettes   Start date: 05/17/1959   Quit date: 05/16/1989   Years since quitting: 34.1   Passive exposure: Past  Smokeless Tobacco Never    Goals Met:  Independence with exercise equipment Exercise tolerated well No report of concerns or symptoms today Strength training completed today  Goals Unmet:  Not Applicable  Comments: Pt able to follow exercise prescription today without complaint.  Will continue to monitor for progression.    Dr. Bethann Punches is Medical Director for [facility] Cardiac Rehabilitation.  Dr. Vida Rigger is Medical Director for [facility] Pulmonary Rehabilitation.

## 2023-07-03 ENCOUNTER — Encounter: Payer: Medicare Other | Admitting: *Deleted

## 2023-07-03 DIAGNOSIS — I5032 Chronic diastolic (congestive) heart failure: Secondary | ICD-10-CM | POA: Diagnosis not present

## 2023-07-03 NOTE — Progress Notes (Signed)
Daily Session Note  Patient Details  Name: RUHEE ENCK MRN: [id number] Date of Birth: June 18, 1940 Referring Provider:   Flowsheet Row Pulmonary Rehab from 02/13/2023 in [facility] Cardiac and Pulmonary Rehab  Referring Provider Julien Nordmann, MD       Encounter Date: 07/03/2023  Check In:  Session Check In - 07/03/23 1555       Check-In   Supervising physician immediately available to respond to emergencies See telemetry face sheet for immediately available ER MD    Location [facility]-Cardiac & Pulmonary Rehab    Staff Present Susann Givens RN,BSN;Joseph [facility] Manya Silvas BS, Exercise Physiologist;Kelly Cloretta Ned, ACSM CEP, Exercise Physiologist    Virtual Visit No    Medication changes reported     No    Fall or balance concerns reported    No    Warm-up and Cool-down Performed on first and last piece of equipment    Resistance Training Performed Yes    VAD Patient? No    PAD/SET Patient? No      Pain Assessment   Currently in Pain? No/denies                Social History   Tobacco Use  Smoking Status Former   Current packs/day: 0.00   Average packs/day: 1 pack/day for 30.0 years (30.0 ttl pk-yrs)   Types: Cigarettes   Start date: 05/17/1959   Quit date: 05/16/1989   Years since quitting: 34.1   Passive exposure: Past  Smokeless Tobacco Never    Goals Met:  Independence with exercise equipment Exercise tolerated well No report of concerns or symptoms today Strength training completed today  Goals Unmet:  Not Applicable  Comments: Pt able to follow exercise prescription today without complaint.  Will continue to monitor for progression.    Dr. Bethann Punches is Medical Director for [facility] Cardiac Rehabilitation.  Dr. Vida Rigger is Medical Director for [facility] Pulmonary Rehabilitation.

## 2023-07-04 ENCOUNTER — Ambulatory Visit: Payer: Medicare Other

## 2023-07-04 ENCOUNTER — Other Ambulatory Visit: Payer: Self-pay | Admitting: Family Medicine

## 2023-07-04 ENCOUNTER — Telehealth: Payer: Self-pay | Admitting: Cardiology

## 2023-07-04 ENCOUNTER — Encounter: Payer: Medicare Other | Admitting: *Deleted

## 2023-07-04 DIAGNOSIS — I5032 Chronic diastolic (congestive) heart failure: Secondary | ICD-10-CM | POA: Diagnosis not present

## 2023-07-04 NOTE — Telephone Encounter (Signed)
Lvm to let pt know appt on 07/06/23 has been moved to 07/07/23

## 2023-07-04 NOTE — Progress Notes (Signed)
Daily Session Note  Patient Details  Name: Lauren Lloyd MRN: [id number] Date of Birth: 1941/01/20 Referring Provider:   Flowsheet Row Pulmonary Rehab from 02/13/2023 in [facility] Cardiac and Pulmonary Rehab  Referring Provider Julien Nordmann, MD       Encounter Date: 07/04/2023  Check In:  Session Check In - 07/04/23 1601       Check-In   Supervising physician immediately available to respond to emergencies See telemetry face sheet for immediately available ER MD    Location [facility]-Cardiac & Pulmonary Rehab    Staff Present Cora Collum, RN, BSN, CCRP;Noah Tickle, BS, Exercise Physiologist;Joseph Hood RCP,RRT,BSRT    Virtual Visit No    Medication changes reported     No    Fall or balance concerns reported    No    Warm-up and Cool-down Performed on first and last piece of equipment    Resistance Training Performed Yes    VAD Patient? No    PAD/SET Patient? No      Pain Assessment   Currently in Pain? No/denies                Social History   Tobacco Use  Smoking Status Former   Current packs/day: 0.00   Average packs/day: 1 pack/day for 30.0 years (30.0 ttl pk-yrs)   Types: Cigarettes   Start date: 05/17/1959   Quit date: 05/16/1989   Years since quitting: 34.1   Passive exposure: Past  Smokeless Tobacco Never    Goals Met:  Proper associated with RPD/PD & O2 Sat Independence with exercise equipment Exercise tolerated well No report of concerns or symptoms today  Goals Unmet:  Not Applicable  Comments: Pt able to follow exercise prescription today without complaint.  Will continue to monitor for progression.    Dr. Bethann Punches is Medical Director for [facility] Cardiac Rehabilitation.  Dr. Vida Rigger is Medical Director for [facility] Pulmonary Rehabilitation.

## 2023-07-05 NOTE — Telephone Encounter (Signed)
Requested Prescriptions  Pending Prescriptions Disp Refills   gabapentin (NEURONTIN) 600 MG tablet [Pharmacy Med Name: GABAPENTIN 600 MG TAB] 90 tablet 0    Sig: TAKE ONE TABLET BY MOUTH AT BEDTIME     Neurology: Anticonvulsants - gabapentin Failed - 07/05/2023 11:27 AM      Failed - Cr in normal range and within 360 days    Creat  Date Value Ref Range Status  01/19/2017 0.78 0.60 - 0.93 mg/dL Final    Comment:    For patients >84 years of age, the reference limit for Creatinine is approximately 13% higher for people identified as African-American. .    Creatinine, Ser  Date Value Ref Range Status  05/26/2023 1.04 (H) 0.44 - 1.00 mg/dL Final         Passed - Completed PHQ-2 or PHQ-9 in the last 360 days      Passed - Valid encounter within last 12 months    Recent Outpatient Visits           3 weeks ago Persistent atrial fibrillation [facility])   [place] [facility] [place], Marzella Schlein, MD   1 month ago Dizziness   [place] [facility] [place], [place], PA-C   3 months ago Lower abdominal pain   [place] [facility] [place], Marzella Schlein, MD   3 months ago Blurry vision   [facility] [place], Marzella Schlein, MD   5 months ago Acute cough   [place] [facility] [place], [place], PA-C       Future Appointments             In 5 days Hunsucker, Lesia Sago, MD [facility] [place]   In 5 months Bacigalupo, Marzella Schlein, MD [facility], [facility]

## 2023-07-06 ENCOUNTER — Ambulatory Visit: Payer: Medicare Other

## 2023-07-06 ENCOUNTER — Encounter: Payer: Medicare Other | Admitting: Cardiology

## 2023-07-06 ENCOUNTER — Encounter: Payer: Medicare Other | Admitting: *Deleted

## 2023-07-06 DIAGNOSIS — I5032 Chronic diastolic (congestive) heart failure: Secondary | ICD-10-CM

## 2023-07-06 NOTE — Progress Notes (Signed)
Daily Session Note  Patient Details  Name: Lauren Lloyd MRN: [id number] Date of Birth: [birth year] Referring Provider:   Flowsheet Row Pulmonary Rehab from 02/13/2023 in [facility] Cardiac and Pulmonary Rehab  Referring Provider Julien Nordmann, MD       Encounter Date: 07/06/2023  Check In:  Session Check In - 07/06/23 1536       Check-In   Supervising physician immediately available to respond to emergencies See telemetry face sheet for immediately available ER MD    Location [facility]-Cardiac & Pulmonary Rehab    Staff Present Cora Collum, RN, BSN, CCRP;Margaret Best, MS, Exercise Physiologist;Maxon Conetta BS, Exercise Physiologist    Virtual Visit No    Medication changes reported     No    Fall or balance concerns reported    No    Warm-up and Cool-down Performed on first and last piece of equipment    Resistance Training Performed Yes    VAD Patient? No    PAD/SET Patient? No      Pain Assessment   Currently in Pain? No/denies                Social History   Tobacco Use  Smoking Status Former   Current packs/day: 0.00   Average packs/day: 1 pack/day for 30.0 years (30.0 ttl pk-yrs)   Types: Cigarettes   Start date: 05/17/1959   Quit date: 05/16/1989   Years since quitting: 34.1   Passive exposure: Past  Smokeless Tobacco Never    Goals Met:  Proper associated with RPD/PD & O2 Sat Independence with exercise equipment Exercise tolerated well No report of concerns or symptoms today  Goals Unmet:  Not Applicable  Comments: Pt able to follow exercise prescription today without complaint.  Will continue to monitor for progression.    Dr. Bethann Punches is Medical Director for [facility] Cardiac Rehabilitation.  Dr. Vida Rigger is Medical Director for [facility] Pulmonary Rehabilitation.

## 2023-07-07 ENCOUNTER — Encounter: Payer: Medicare Other | Admitting: Cardiology

## 2023-07-09 ENCOUNTER — Telehealth: Payer: Medicare Other | Admitting: Physician Assistant

## 2023-07-09 ENCOUNTER — Encounter: Payer: Self-pay | Admitting: Intensive Care

## 2023-07-09 ENCOUNTER — Emergency Department
Admission: EM | Admit: 2023-07-09 | Discharge: 2023-07-09 | Disposition: A | Discharge status: Home / Self Care | Payer: Medicare Other | Attending: Emergency Medicine | Admitting: Emergency Medicine

## 2023-07-09 ENCOUNTER — Emergency Department: Payer: Medicare Other

## 2023-07-09 ENCOUNTER — Other Ambulatory Visit: Payer: Self-pay

## 2023-07-09 DIAGNOSIS — R109 Unspecified abdominal pain: Secondary | ICD-10-CM | POA: Diagnosis not present

## 2023-07-09 DIAGNOSIS — R1032 Left lower quadrant pain: Secondary | ICD-10-CM | POA: Diagnosis not present

## 2023-07-09 DIAGNOSIS — D72829 Elevated white blood cell count, unspecified: Secondary | ICD-10-CM | POA: Insufficient documentation

## 2023-07-09 DIAGNOSIS — K5732 Diverticulitis of large intestine without perforation or abscess without bleeding: Secondary | ICD-10-CM | POA: Insufficient documentation

## 2023-07-09 DIAGNOSIS — K8020 Calculus of gallbladder without cholecystitis without obstruction: Secondary | ICD-10-CM | POA: Diagnosis not present

## 2023-07-09 HISTORY — DX: Essential (primary) hypertension: I10

## 2023-07-09 HISTORY — DX: Heart failure, unspecified: I50.9

## 2023-07-09 LAB — COMPREHENSIVE METABOLIC PANEL
ALT: 16 U/L (ref 0–44)
AST: 21 U/L (ref 15–41)
Albumin: 3.7 g/dL (ref 3.5–5.0)
Alkaline Phosphatase: 98 U/L (ref 38–126)
Anion gap: 12 (ref 5–15)
BUN: 15 mg/dL (ref 8–23)
CO2: 21 mmol/L — ABNORMAL LOW (ref 22–32)
Calcium: 9.2 mg/dL (ref 8.9–10.3)
Chloride: 104 mmol/L (ref 98–111)
Creatinine, Ser: 0.83 mg/dL (ref 0.44–1.00)
GFR, Estimated: >60 mL/min (ref >60)
Glucose, Bld: 116 mg/dL — ABNORMAL HIGH (ref 70–99)
Potassium: 3.8 mmol/L (ref 3.5–5.1)
Sodium: 137 mmol/L (ref 135–145)
Total Bilirubin: 1.3 mg/dL — ABNORMAL HIGH (ref 0.0–1.2)
Total Protein: 6.9 g/dL (ref 6.5–8.1)

## 2023-07-09 LAB — URINALYSIS, ROUTINE W REFLEX MICROSCOPIC
Bilirubin Urine: NEGATIVE
Glucose, UA: 150 mg/dL — AB
Ketones, ur: NEGATIVE mg/dL
Nitrite: NEGATIVE
Protein, ur: 100 mg/dL — AB
Specific Gravity, Urine: 1.024 (ref 1.005–1.030)
pH: 5 (ref 5.0–8.0)

## 2023-07-09 LAB — CBC
HCT: 41 % (ref 36.0–46.0)
Hemoglobin: 13.5 g/dL (ref 12.0–15.0)
MCH: 31.1 pg (ref 26.0–34.0)
MCHC: 32.9 g/dL (ref 30.0–36.0)
MCV: 94.5 fL (ref 80.0–100.0)
Platelets: 236 10*3/uL (ref 150–400)
RBC: 4.34 MIL/uL (ref 3.87–5.11)
RDW: 12.7 % (ref 11.5–15.5)
WBC: 11 10*3/uL — ABNORMAL HIGH (ref 4.0–10.5)
nRBC: 0 % (ref 0.0–0.2)

## 2023-07-09 LAB — LIPASE, BLOOD: Lipase: 27 U/L (ref 11–51)

## 2023-07-09 MED ORDER — AMOXICILLIN-POT CLAVULANATE ER 1000-62.5 MG PO TB12: 1.0000 | ORAL_TABLET | Freq: Two times a day (BID) | ORAL | 0 refills | Status: AC

## 2023-07-09 MED ORDER — AMOXICILLIN-POT CLAVULANATE ER 1000-62.5 MG PO TB12: 1.0000 | ORAL_TABLET | Freq: Two times a day (BID) | ORAL | 0 refills | Status: DC

## 2023-07-09 MED ORDER — AMOXICILLIN-POT CLAVULANATE 875-125 MG PO TABS
1.0000 | ORAL_TABLET | Freq: Once | ORAL | Status: AC
  Administered 2023-07-09: 1 via ORAL
  Filled 2023-07-09: qty 1

## 2023-07-09 MED ORDER — IOHEXOL 300 MG/ML  SOLN
100.0000 mL | Freq: Once | INTRAMUSCULAR | Status: AC | PRN
  Administered 2023-07-09: 100 mL via INTRAVENOUS

## 2023-07-09 MED ORDER — ACETAMINOPHEN 500 MG PO TABS: 500.0000 mg | ORAL_TABLET | Freq: Four times a day (QID) | ORAL | 0 refills | Status: AC | PRN

## 2023-07-09 NOTE — Addendum Note (Signed)
 Addended byLaure Kidney on: 07/09/2023 11:48 AM   Modules accepted: Level of Service

## 2023-07-09 NOTE — Progress Notes (Signed)
 Virtual Visit Consent   [facility] [place], you are scheduled for a virtual visit with a [place] provider today. Just as with appointments in the office, your consent must be obtained to participate. Your consent will be active for this visit and any virtual visit you may have with one of our providers in the next 365 days. If you have a MyChart account, a copy of this consent can be sent to you electronically.  As this is a virtual visit, video technology does not allow for your provider to perform a traditional examination. This may limit your provider's ability to fully assess your condition. If your provider identifies any concerns that need to be evaluated in person or the need to arrange testing (such as labs, EKG, etc.), we will make arrangements to do so. Although advances in technology are sophisticated, we cannot ensure that it will always work on either your end or our end. If the connection with a video visit is poor, the visit may have to be switched to a telephone visit. With either a video or telephone visit, we are not always able to ensure that we have a secure connection.  By engaging in this virtual visit, you consent to the provision of healthcare and authorize for your insurance to be billed (if applicable) for the services provided during this visit. Depending on your insurance coverage, you may receive a charge related to this service.  I need to obtain your verbal consent now. Are you willing to proceed with your visit today? Lauren Lloyd has provided verbal consent on 07/09/2023 for a virtual visit (video or telephone). Lauren Lloyd, [place]  Date: 07/09/2023 11:41 AM   Virtual Visit via Video Note   I, Lauren Lloyd, connected with  Lauren Lloyd  ([id number], [birth year]) on 07/09/23 at 11:30 AM EST by a video-enabled telemedicine application and verified that I am speaking with the correct person using two identifiers.  Location: Patient: Virtual Visit Location Patient:  Home Provider: Virtual Visit Location Provider: Home Office   I discussed the limitations of evaluation and management by telemedicine and the availability of in person appointments. The patient expressed understanding and agreed to proceed.    History of Present Illness: [name] is a 83 y.o. who identifies as a female who was assigned female at birth, and is being seen today for left flank pain.  HPI: Flank Pain This is a new problem. The current episode started yesterday. The problem occurs constantly. The problem has been gradually worsening since onset. The quality of the pain is described as shooting and stabbing. The pain is at a severity of 10/10. The pain is severe. The pain is The same all the time. The symptoms are aggravated by twisting. Associated symptoms include abdominal pain. Pertinent negatives include no dysuria, fever, headaches, leg pain, paresis, paresthesias, pelvic pain, perianal numbness, tingling, weakness or weight loss. She has tried bed rest for the symptoms. The treatment provided mild relief.    Problems:  Patient Active Problem List   Diagnosis Date Noted   Dizziness 05/25/2023   Nasal congestion 05/25/2023   COVID-19 02/01/2023   Prediabetes 10/03/2022   Chronic gout due to renal impairment of multiple sites without tophus 08/11/2022   Stage 2 chronic Lloyd disease 08/11/2022   Acute gout due to renal impairment involving toe of right foot 08/03/2022   Acute diastolic (congestive) heart failure (HCC) 05/11/2022   Atrial fibrillation with RVR (HCC) 05/10/2022   Secondary hypercoagulable state (HCC)  10/18/2021   Chronic hip pain (Left) 08/19/2021   Osteoarthritis of hip (Left) 08/02/2021   DDD (degenerative disc disease), lumbar 07/27/2021   Lumbar foraminal stenosis (Multilevel) (Bilateral) (R>L: T12-L1, L1-2, L5-S1) (L>R: L2-3, L3-4,) 07/19/2021   Abnormal MRI, lumbar spine (02/24/2021) 06/16/2021   Chronic low back pain (1ry area of Pain)  (Bilateral) (R>L) w/o sciatica 06/16/2021   Chronic sacroiliac joint pain (3ry area of Pain) (Right) 06/16/2021   Lumbar facet syndrome (Bilateral) 06/16/2021   Grade 1 Anterolisthesis of lumbar spine of L4/L5 06/16/2021   Retrolisthesis of L1 over L2 and L2 over L3 06/16/2021   Lumbar central spinal stenosis, w/o neurogenic claudication (L3-4, L4-5) 06/16/2021   Lumbosacral foraminal stenosis (Multilevel) (Bilateral) 06/16/2021   Lumbar lateral recess stenosis (Bilateral: L4-5) 06/16/2021   Lumbar facet arthropathy (Multilevel) (Bilateral) 06/16/2021   Chronic pain syndrome 06/15/2021   Pharmacologic therapy 06/15/2021   Disorder of skeletal system 06/15/2021   Problems influencing health status 06/15/2021   Chronic anticoagulation (Eliquis) 12/05/2020   Carotid artery disease (HCC) 12/05/2020   Avitaminosis D 08/03/2020   Chronic fatigue 08/03/2020   Insomnia 08/03/2020   Obesity (BMI 30-39.9) 11/08/2019   Bradycardia 11/08/2019   Thoracic spondylosis 02/27/2019   Dyspnea on exertion 08/28/2018   Degenerative cervical spinal stenosis 07/26/2018   Persistent atrial fibrillation (HCC) 06/06/2018   Paresthesias 05/30/2018   Chronic diastolic CHF (congestive heart failure) (HCC) 05/11/2018   Centrilobular emphysema (HCC) 05/11/2018   Hematuria 04/04/2017   Leg cramps 04/04/2017   Metatarsalgia of both feet 04/04/2017   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/2017   Essential hypertension 01/03/2017   Hemorrhoids 01/03/2017   Chronic right shoulder pain 01/03/2017   Pulmonary hypertension, unspecified (HCC) 04/02/2015   Acid reflux 11/18/2014   IBS (irritable bowel syndrome) 11/18/2014   Primary osteoarthritis of one hip 10/03/2011   L-S radiculopathy 09/29/2011   S/P TKR (total knee replacement), left 09/29/2011   Primary osteoarthritis of left knee 09/29/2011   Non-toxic uninodular goiter 06/20/2009   Cervical pain 08/20/2008   OSA (obstructive sleep apnea) 12/12/2007    Hypercholesteremia 07/30/2007    Allergies:  Allergies  Allergen Reactions   Levofloxacin Other (See Comments)    Other reaction(s): Joint Pains   Influenza Vaccine Recombinant Other (See Comments)   Influenza Vaccines Other (See Comments)    Bell's Palsy   Oysters [Shellfish Allergy] Swelling    She states she had eaten them three days in a row and she developed swelling around her eyes.    Medications:  Current Outpatient Medications:    acetaminophen (TYLENOL) 325 MG tablet, Take 650 mg by mouth every 6 (six) hours as needed for moderate pain., Disp: , Rfl:    allopurinol (ZYLOPRIM) 100 MG tablet, TAKE ONE TABLET BY MOUTH EVERY DAY, Disp: 90 tablet, Rfl: 1   calcium carbonate (TUMS EX) 750 MG chewable tablet, Chew 2 tablets by mouth daily as needed for heartburn., Disp: , Rfl:    Cholecalciferol 25 MCG (1000 UT) tablet, Take 1,000 Units by mouth daily., Disp: , Rfl:    dapagliflozin propanediol (FARXIGA) 10 MG TABS tablet, Take 1 tablet (10 mg total) by mouth daily before breakfast., Disp: 30 tablet, Rfl: 6   ELIQUIS 5 MG TABS tablet, TAKE ONE TABLET TWICE DAILY, Disp: 180 tablet, Rfl: 1   ezetimibe (ZETIA) 10 MG tablet, TAKE 1 TABLET BY MOUTH DAILY, Disp: 90 tablet, Rfl: 3   fluticasone (FLONASE) 50 MCG/ACT nasal spray, Place 2 sprays into both nostrils daily., Disp: 16  g, Rfl: 6   furosemide (LASIX) 40 MG tablet, TAKE 1 TABLET BY MOUTH DAILY. TAKE AN EXTRA TABLET AS NEEDED AFTER LUNCH FOR ABDOMINAL SWELLING, LEG SWELLING OR SHORTNESS OF BREATH, Disp: 180 tablet, Rfl: 1   gabapentin (NEURONTIN) 600 MG tablet, TAKE ONE TABLET BY MOUTH AT BEDTIME, Disp: 90 tablet, Rfl: 0   Magnesium 300 MG CAPS, Take by mouth., Disp: , Rfl:    meclizine (ANTIVERT) 25 MG tablet, Take 1 tablet (25 mg total) by mouth 3 (three) times daily as needed for dizziness. (Patient not taking: Reported on 06/12/2023), Disp: 30 tablet, Rfl: 1   Menthol, Topical Analgesic, (BIOFREEZE EX), Apply 1 application. topically  daily as needed (Neck pain)., Disp: , Rfl:    metaxalone (SKELAXIN) 800 MG tablet, Take 1 tablet (800 mg total) by mouth daily as needed for muscle spasms., Disp: 30 tablet, Rfl: 2   Multiple Vitamin (MULTIVITAMIN) capsule, Take 1 capsule by mouth daily., Disp: , Rfl:    mupirocin ointment (BACTROBAN) 2 %, Place 1 application  into the nose 2 (two) times daily., Disp: 22 g, Rfl: 0   potassium chloride (KLOR-CON) 10 MEQ tablet, TAKE 1 TABLET BY MOUTH DAILY, Disp: 30 tablet, Rfl: 5   Tiotropium Bromide-Olodaterol (STIOLTO RESPIMAT) 2.5-2.5 MCG/ACT AERS, Inhale 2 puffs into the lungs daily., Disp: 4 g, Rfl: 0  Observations/Objective: Patient is well-developed, well-nourished in no acute distress.  Resting comfortably  at home.  Head is normocephalic, atraumatic.  No labored breathing.  Speech is clear and coherent with logical content.  Patient is alert and oriented at baseline.  Daughter palpated patients belly at the site of the pain and patient winced/grimaced in pain  Assessment and Plan: 1. LLQ abdominal pain (Primary)  Emergent referral to ER to rule out any intraabdominal process. Other etiologies such as nephrolithiasis, utero lithiasis, cystis, pyelonephritis, diverticulitis should also be considered. Patient and daughter is in agreement with this plan and patient is to report to the ER for emergent evaluation.   Follow Up Instructions: I discussed the assessment and treatment plan with the patient. The patient was provided an opportunity to ask questions and all were answered. The patient agreed with the plan and demonstrated an understanding of the instructions.  A copy of instructions were sent to the patient via MyChart unless otherwise noted below.   The patient was advised to call back or seek an in-person evaluation if the symptoms worsen or if the condition fails to improve as anticipated.    Lauren Kidney, PA-C

## 2023-07-09 NOTE — Discharge Instructions (Addendum)
 You have been diagnosed with diverticulitis.  Please take Augmentin 1 tablet by mouth 2 times daily for 7 days.  Please take Tylenol 500 mg 1 tablet by mouth every 6 hours as needed for pain, please follow a liquid diet.  Please make an appointment with your PCP for a follow-up in 3 days.  Come back or go to your PCP if you have new symptoms or symptoms worsen.

## 2023-07-09 NOTE — Patient Instructions (Signed)
 [name], thank you for joining Laure Kidney, PA-C for today's virtual visit.  While this provider is not your primary care provider (PCP), if your PCP is located in our provider database this encounter information will be shared with them immediately following your visit.   A [place] MyChart account gives you access to today's visit and all your visits, tests, and labs performed at [facility] " click here if you don't have a [place] MyChart account or go to [email or web address]  Consent: (Patient) Lauren Lloyd provided verbal consent for this virtual visit at the beginning of the encounter.  Current Medications:  Current Outpatient Medications:    acetaminophen (TYLENOL) 325 MG tablet, Take 650 mg by mouth every 6 (six) hours as needed for moderate pain., Disp: , Rfl:    allopurinol (ZYLOPRIM) 100 MG tablet, TAKE ONE TABLET BY MOUTH EVERY DAY, Disp: 90 tablet, Rfl: 1   calcium carbonate (TUMS EX) 750 MG chewable tablet, Chew 2 tablets by mouth daily as needed for heartburn., Disp: , Rfl:    Cholecalciferol 25 MCG (1000 UT) tablet, Take 1,000 Units by mouth daily., Disp: , Rfl:    dapagliflozin propanediol (FARXIGA) 10 MG TABS tablet, Take 1 tablet (10 mg total) by mouth daily before breakfast., Disp: 30 tablet, Rfl: 6   ELIQUIS 5 MG TABS tablet, TAKE ONE TABLET TWICE DAILY, Disp: 180 tablet, Rfl: 1   ezetimibe (ZETIA) 10 MG tablet, TAKE 1 TABLET BY MOUTH DAILY, Disp: 90 tablet, Rfl: 3   fluticasone (FLONASE) 50 MCG/ACT nasal spray, Place 2 sprays into both nostrils daily., Disp: 16 g, Rfl: 6   furosemide (LASIX) 40 MG tablet, TAKE 1 TABLET BY MOUTH DAILY. TAKE AN EXTRA TABLET AS NEEDED AFTER LUNCH FOR ABDOMINAL SWELLING, LEG SWELLING OR SHORTNESS OF BREATH, Disp: 180 tablet, Rfl: 1   gabapentin (NEURONTIN) 600 MG tablet, TAKE ONE TABLET BY MOUTH AT BEDTIME, Disp: 90 tablet, Rfl: 0   Magnesium 300 MG CAPS, Take by mouth., Disp: , Rfl:    meclizine (ANTIVERT) 25  MG tablet, Take 1 tablet (25 mg total) by mouth 3 (three) times daily as needed for dizziness. (Patient not taking: Reported on 06/12/2023), Disp: 30 tablet, Rfl: 1   Menthol, Topical Analgesic, (BIOFREEZE EX), Apply 1 application. topically daily as needed (Neck pain)., Disp: , Rfl:    metaxalone (SKELAXIN) 800 MG tablet, Take 1 tablet (800 mg total) by mouth daily as needed for muscle spasms., Disp: 30 tablet, Rfl: 2   Multiple Vitamin (MULTIVITAMIN) capsule, Take 1 capsule by mouth daily., Disp: , Rfl:    mupirocin ointment (BACTROBAN) 2 %, Place 1 application  into the nose 2 (two) times daily., Disp: 22 g, Rfl: 0   potassium chloride (KLOR-CON) 10 MEQ tablet, TAKE 1 TABLET BY MOUTH DAILY, Disp: 30 tablet, Rfl: 5   Tiotropium Bromide-Olodaterol (STIOLTO RESPIMAT) 2.5-2.5 MCG/ACT AERS, Inhale 2 puffs into the lungs daily., Disp: 4 g, Rfl: 0   Medications ordered in this encounter:  No orders of the defined types were placed in this encounter.    *If you need refills on other medications prior to your next appointment, please contact your pharmacy*  Follow-Up: Call back or seek an in-person evaluation if the symptoms worsen or if the condition fails to improve as anticipated.  [facility] Virtual [facility] [phone]  Other Instructions Report to ER for evaluation.    If you have been instructed to have an in-person evaluation today at a local Urgent Care  facility, please use the link below. It will take you to a list of all of our available [place] [facility], including address, phone number and hours of operation. Please do not delay care.  [facility]  If you or a family member do not have a primary care provider, use the link below to schedule a visit and establish care. When you choose a [place] primary care physician or advanced practice provider, you gain a long-term partner in health. Find a Primary Care Provider  Learn more about [place]'s in-office  and virtual care options:  - Get Care Now

## 2023-07-09 NOTE — ED Triage Notes (Signed)
 Patient c/o abdominal pain. Started yesterday. Reports some nausea  Recently started taking farxiga

## 2023-07-09 NOTE — ED Provider Notes (Signed)
 [facility] Provider Note    Event Date/Time   First MD Initiated Contact with Patient 07/09/23 1319     (approximate)   History   Abdominal Pain   HPI  Lauren Lloyd is a 83 y.o. female   who presents today with history of 24 hours of diffuse abdominal pain, diarrhea, patient denies fever nasal congestion dysuria cough.  No sick contacts at home      Physical Exam   Triage Vital Signs: ED Triage Vitals  Encounter Vitals Group     BP 07/09/23 1219 (!) 156/70     Systolic BP Percentile --      Diastolic BP Percentile --      Pulse Rate 07/09/23 1219 (!) 58     Resp 07/09/23 1219 18     Temp 07/09/23 1219 98.6 F (37 C)     Temp Source 07/09/23 1219 Oral     SpO2 07/09/23 1219 96 %     Weight 07/09/23 1230 220 lb (99.8 kg)     Height 07/09/23 1230 5' 7.5" (1.715 m)     Head Circumference --      Peak Flow --      Pain Score 07/09/23 1219 6     Pain Loc --      Pain Education --      Exclude from Growth Chart --     Most recent vital signs: Vitals:   07/09/23 1219  BP: (!) 156/70  Pulse: (!) 58  Resp: 18  Temp: 98.6 F (37 C)  SpO2: 96%     Constitutional: Alert, mild distress Eyes: Conjunctivae are normal.  Head: Atraumatic. Nose: No congestion/rhinnorhea. Mouth/Throat: Mucous membranes are dry Neck: Painless ROM.  Cardiovascular:   Good peripheral circulation. Respiratory: Normal respiratory effort.  No retractions.  Gastrointestinal: Skin without scars, bowel sounds positive, McBurney point negative Rovsing point negative, tender to  deep palpation in the left lower quadrant, distended, tympanic. Musculoskeletal:  no deformity Neurologic:  MAE spontaneously. No gross focal neurologic deficits are appreciated.  Skin:  Skin is warm, dry and intact. No rash noted. Psychiatric: Mood and affect are normal. Speech and behavior are normal.    ED Results / Procedures / Treatments   Labs (all labs ordered are listed, but only  abnormal results are displayed) Labs Reviewed  COMPREHENSIVE METABOLIC PANEL - Abnormal; Notable for the following components:      Result Value   CO2 21 (*)    Glucose, Bld 116 (*)    Total Bilirubin 1.3 (*)    All other components within normal limits  CBC - Abnormal; Notable for the following components:   WBC 11.0 (*)    All other components within normal limits  URINALYSIS, ROUTINE W REFLEX MICROSCOPIC - Abnormal; Notable for the following components:   Color, Urine YELLOW (*)    APPearance HAZY (*)    Glucose, UA 150 (*)    Hgb urine dipstick SMALL (*)    Protein, ur 100 (*)    Leukocytes,Ua TRACE (*)    Bacteria, UA RARE (*)    All other components within normal limits  LIPASE, BLOOD     EKG     RADIOLOGY I independently reviewed and interpreted imaging and agree with radiologists findings.      PROCEDURES:  Critical Care performed:   Procedures   MEDICATIONS ORDERED IN ED: Medications  iohexol (OMNIPAQUE) 300 MG/ML solution 100 mL (100 mLs Intravenous Contrast Given 07/09/23 1525)  IMPRESSION / MDM / ASSESSMENT AND PLAN / ED COURSE  I reviewed the triage vital signs and the nursing notes.  Differential diagnosis includes, but is not limited to, colitis, diverticulosis, IBS  Patient's presentation is most consistent with acute complicated illness / injury requiring diagnostic workup.   Patient's diagnosis is consistent with sigmoid diverticulitis. I independently reviewed and interpreted imaging and agree with radiologists findings. Labs are  reassuring. I did review the patient's allergies and medications. Patient will be discharged home with prescriptions for Augmentin, acetaminophen. Patient is to follow up with PCP as needed or otherwise directed. Patient is given ED precautions to return to the ED for any worsening or new symptoms. Discussed plan of care with patient, answered all of patient's questions, Patient agreeable to plan of care. Advised  patient to take medications according to the instructions on the label. Discussed possible side effects of new medications. Patient verbalized understanding. Clinical Course as of 07/09/23 1612  Sun Jul 09, 2023  1339 Urinalysis, Routine w reflex microscopic -Urine, Clean Catch(!) Glucose positive, hemoglobin present, leukocytes trace, bacteria rare [AE]  1340 CBC(!) White blood cells elevated 11.0 [AE]  1340 Comprehensive metabolic panel(!) Total bilirubin is elevated 1.3, electrolytes within normal limits, renal function within normal limits, LFTs within normal limits [AE]  1340 Lipase, blood Within normal limits [AE]  1603 CT ABDOMEN PELVIS W CONTRAST 1. Acute uncomplicated sigmoid diverticulitis. No perforation, fluid collection, or abscess. 2. Cholelithiasis without cholecystitis. 3. Trace free fluid within the left lower quadrant and pelvis. 4.  Aortic Atherosclerosis (ICD10-I70.0). 5. Cardiomegaly.   [AE]    Clinical Course User Index [AE] Gladys Damme, PA-C     FINAL CLINICAL IMPRESSION(S) / ED DIAGNOSES   Final diagnoses:  None     Rx / DC Orders   ED Discharge Orders     None        Note:  This document was prepared using Dragon voice recognition software and may include unintentional dictation errors.   Gladys Damme, PA-C 07/09/23 1619    Merwyn Katos, MD 07/09/23 [phone]

## 2023-07-10 ENCOUNTER — Ambulatory Visit: Payer: Medicare HMO | Admitting: Pulmonary Disease

## 2023-07-10 NOTE — ED Notes (Signed)
 [facility] called and augmentin xr is not available in this area.  Per dr Cyril Loosen we can change to augmentin [phone] twice a day with same directions.  Nicole at [facility] will change it.

## 2023-07-11 ENCOUNTER — Ambulatory Visit (HOSPITAL_COMMUNITY): Payer: Medicare Other

## 2023-07-11 ENCOUNTER — Telehealth: Payer: Self-pay

## 2023-07-11 ENCOUNTER — Ambulatory Visit: Payer: Medicare Other

## 2023-07-11 NOTE — Transitions of Care (Post Inpatient/ED Visit) (Signed)
 07/11/2023  Name: Lauren Lloyd MRN: [id number] DOB: 1941/04/30  Today's TOC FU Call Status: Today's TOC FU Call Status:: Successful TOC FU Call Completed TOC FU Call Complete Date: 07/11/23 Patient's Name and Date of Birth confirmed.  Transition Care Management Follow-up Telephone Call Date of Discharge: 07/09/23 Discharge Facility: [facility] Type of Discharge: Emergency Department Reason for ED Visit: Other: (diverticulits) How have you been since you were released from the hospital?: Better Any questions or concerns?: No  Items Reviewed: Did you receive and understand the discharge instructions provided?: Yes Medications obtained,verified, and reconciled?: Yes (Medications Reviewed) Any new allergies since your discharge?: No Dietary orders reviewed?: Yes Do you have support at home?: No  Medications Reviewed Today: Medications Reviewed Today     Reviewed by Karena Addison, LPN (Licensed Practical Nurse) on 07/11/23 at 1429  Med List Status: <None>   Medication Order Taking? Sig Documenting Provider Last Dose Status Informant  acetaminophen (TYLENOL) 500 MG tablet [id number]  Take 1 tablet (500 mg total) by mouth every 6 (six) hours as needed for up to 7 days. Gladys Damme, PA-C  Active   allopurinol (ZYLOPRIM) 100 MG tablet [id number] No TAKE ONE TABLET BY MOUTH EVERY DAY Bacigalupo, Marzella Schlein, MD Taking Active   amoxicillin-clavulanate (AUGMENTIN XR) 1000-62.5 MG 12 hr tablet [id number]  Take 1 tablet by mouth 2 (two) times daily for 7 days. Gladys Damme, PA-C  Active   calcium carbonate (TUMS EX) 750 MG chewable tablet [id number] No Chew 2 tablets by mouth daily as needed for heartburn. [provider] Taking Active Self  Cholecalciferol 25 MCG (1000 UT) tablet [id number] No Take 1,000 Units by mouth daily. [provider] Taking Active Self  dapagliflozin propanediol (FARXIGA) 10 MG TABS tablet [id number] No Take 1 tablet  (10 mg total) by mouth daily before breakfast. Romie Minus, MD Taking Active   ELIQUIS 5 MG TABS tablet [id number] No TAKE ONE TABLET TWICE DAILY Mariah Milling, Tollie Pizza, MD Taking Active Self  ezetimibe (ZETIA) 10 MG tablet [id number] No TAKE 1 TABLET BY MOUTH DAILY Bacigalupo, Marzella Schlein, MD Taking Active   fluticasone (FLONASE) 50 MCG/ACT nasal spray [id number] No Place 2 sprays into both nostrils daily. Debera Lat, PA-C Taking Active   furosemide (LASIX) 40 MG tablet [id number] No TAKE 1 TABLET BY MOUTH DAILY. TAKE AN EXTRA TABLET AS NEEDED AFTER LUNCH FOR ABDOMINAL SWELLING, LEG SWELLING OR SHORTNESS OF BREATH Gollan, Tollie Pizza, MD Taking Active   gabapentin (NEURONTIN) 600 MG tablet [id number]  TAKE ONE TABLET BY MOUTH AT BEDTIME Erasmo Downer, MD  Active   Magnesium 300 MG CAPS [id number] No Take by mouth. [provider] Taking Active   meclizine (ANTIVERT) 25 MG tablet [id number] No Take 1 tablet (25 mg total) by mouth 3 (three) times daily as needed for dizziness.  Patient not taking: Reported on 06/12/2023   Antonieta Iba, MD Not Taking Active   Menthol, Topical Analgesic, [facility] EX) [id number] No Apply 1 application. topically daily as needed (Neck pain). [provider] Taking Active Self  metaxalone (SKELAXIN) 800 MG tablet [id number] No Take 1 tablet (800 mg total) by mouth daily as needed for muscle spasms. Erasmo Downer, MD Taking Active   Multiple Vitamin (MULTIVITAMIN) capsule [id number] No Take 1 capsule by mouth daily. [provider] Taking Active Self  mupirocin ointment (BACTROBAN) 2 % [id number] No Place 1 application  into the nose 2 (two) times daily. Graciella Freer, PA-C Taking  Active Self  potassium chloride (KLOR-CON) 10 MEQ tablet [id number] No TAKE 1 TABLET BY MOUTH DAILY Gollan, Tollie Pizza, MD Taking Active   Tiotropium Bromide-Olodaterol (STIOLTO RESPIMAT) 2.5-2.5 MCG/ACT AERS [id number] No Inhale 2 puffs into the lungs  daily. Hunsucker, Lesia Sago, MD Taking Active   Med List Note Concepcion Elk, RN 07/28/21 1339): 06-16-21 UDS 07/19/2021  Medical clearance sent to Dr Mariah Milling to stop Eliquis 3 days            Home Care and Equipment/Supplies: Were Home Health Services Ordered?: NA Any new equipment or medical supplies ordered?: NA  Functional Questionnaire: Do you need assistance with bathing/showering or dressing?: No Do you need assistance with meal preparation?: No Do you need assistance with eating?: No Do you have difficulty maintaining continence: No Do you need assistance with getting out of bed/getting out of a chair/moving?: No Do you have difficulty managing or taking your medications?: No  Follow up appointments reviewed: PCP Follow-up appointment confirmed?: Yes Date of PCP follow-up appointment?: 07/13/23 Follow-up Provider: [facility] Follow-up appointment confirmed?: NA Do you need transportation to your follow-up appointment?: No Do you understand care options if your condition(s) worsen?: Yes-patient verbalized understanding    SIGNATURE Karena Addison, LPN [facility] Nurse Health Advisor Direct Dial [phone]

## 2023-07-13 ENCOUNTER — Ambulatory Visit: Payer: Medicare Other

## 2023-07-13 ENCOUNTER — Encounter: Payer: Self-pay | Admitting: Family Medicine

## 2023-07-13 ENCOUNTER — Ambulatory Visit: Payer: Medicare Other | Admitting: Family Medicine

## 2023-07-13 VITALS — BP 152/77 | HR 73 | Ht 67.0 in | Wt 215.8 lb

## 2023-07-13 DIAGNOSIS — I5032 Chronic diastolic (congestive) heart failure: Secondary | ICD-10-CM

## 2023-07-13 DIAGNOSIS — I4819 Other persistent atrial fibrillation: Secondary | ICD-10-CM | POA: Diagnosis not present

## 2023-07-13 DIAGNOSIS — K5792 Diverticulitis of intestine, part unspecified, without perforation or abscess without bleeding: Secondary | ICD-10-CM | POA: Diagnosis not present

## 2023-07-13 NOTE — Progress Notes (Signed)
 Established patient visit   Patient: Lauren Lloyd   DOB: [birth year]   [name]  MRN: [id number] Visit Date: 07/13/2023  Today's healthcare provider: Shirlee Latch, MD   Chief Complaint  Patient presents with   Hospitalization Follow-up    Pt reports today is the first day she felt like a human again. States she is still having trouble with dizziness. Reports she is supposed to go to [place] on Saturday with her daughters and wants to know if that is something she should do.  Would also like ot know what brings on diverticulitis   Medication Consultation    Would like to know if farxiga could be contributing to her diverticulitis    Subjective    HPI HPI     Hospitalization Follow-up    Additional comments: Pt reports today is the first day she felt like a human again. States she is still having trouble with dizziness. Reports she is supposed to go to [place] on Saturday with her daughters and wants to know if that is something she should do.  Would also like ot know what brings on diverticulitis        Medication Consultation    Additional comments: Would like to know if farxiga could be contributing to her diverticulitis       Last edited by Acey Lav, CMA on 07/13/2023  2:28 PM.       Discussed the use of AI scribe software for clinical note transcription with the patient, who gave verbal consent to proceed.  History of Present Illness   Lauren Lloyd, a patient with a history of heart failure and recent diagnosis of diverticulitis, presents for a follow-up visit. She reports feeling "100 percent better" since starting antibiotics for her diverticulitis. However, she still experiences dizziness, which she manages with the help of a friend who performs the Epley maneuver on her. She also mentions that her eyesight is affected during these episodes of dizziness.  In addition to her diverticulitis and dizziness, Lauren Lloyd expresses concern about her medication, Eliquis. She  mentions that she has been able to obtain the medication through friends whose husbands had passed away and were previously on the same medication. She also mentions that she has not heard from her pharmacy team recently regarding the medication.  Lauren Lloyd also mentions a planned trip to [place] with her daughters, which she is concerned about due to her recent health issues. However, she expresses a desire to go and enjoy the trip.  Lastly, Lauren Lloyd mentions an issue with her heart not expanding properly, as told to her by her heart failure clinic. She is currently on Farxiga for this issue.         Medications: Outpatient Medications Prior to Visit  Medication Sig Note   acetaminophen (TYLENOL) 500 MG tablet Take 1 tablet (500 mg total) by mouth every 6 (six) hours as needed for up to 7 days.    allopurinol (ZYLOPRIM) 100 MG tablet TAKE ONE TABLET BY MOUTH EVERY DAY    amoxicillin-clavulanate (AUGMENTIN XR) 1000-62.5 MG 12 hr tablet Take 1 tablet by mouth 2 (two) times daily for 7 days.    calcium carbonate (TUMS EX) 750 MG chewable tablet Chew 2 tablets by mouth daily as needed for heartburn.    Cholecalciferol 25 MCG (1000 UT) tablet Take 1,000 Units by mouth daily.    ezetimibe (ZETIA) 10 MG tablet TAKE 1 TABLET BY MOUTH DAILY    fluticasone (FLONASE) 50 MCG/ACT  nasal spray Place 2 sprays into both nostrils daily.    furosemide (LASIX) 40 MG tablet TAKE 1 TABLET BY MOUTH DAILY. TAKE AN EXTRA TABLET AS NEEDED AFTER LUNCH FOR ABDOMINAL SWELLING, LEG SWELLING OR SHORTNESS OF BREATH    gabapentin (NEURONTIN) 600 MG tablet TAKE ONE TABLET BY MOUTH AT BEDTIME    Magnesium 300 MG CAPS Take by mouth.    Menthol, Topical Analgesic, (BIOFREEZE EX) Apply 1 application. topically daily as needed (Neck pain).    metaxalone (SKELAXIN) 800 MG tablet Take 1 tablet (800 mg total) by mouth daily as needed for muscle spasms.    Multiple Vitamin (MULTIVITAMIN) capsule Take 1 capsule by mouth daily.     mupirocin ointment (BACTROBAN) 2 % Place 1 application  into the nose 2 (two) times daily.    potassium chloride (KLOR-CON) 10 MEQ tablet TAKE 1 TABLET BY MOUTH DAILY    Tiotropium Bromide-Olodaterol (STIOLTO RESPIMAT) 2.5-2.5 MCG/ACT AERS Inhale 2 puffs into the lungs daily.    dapagliflozin propanediol (FARXIGA) 10 MG TABS tablet Take 1 tablet (10 mg total) by mouth daily before breakfast. (Patient not taking: Reported on 07/13/2023) 07/13/2023: Last taken on Saturday   ELIQUIS 5 MG TABS tablet TAKE ONE TABLET TWICE DAILY (Patient not taking: Reported on 07/13/2023)    meclizine (ANTIVERT) 25 MG tablet Take 1 tablet (25 mg total) by mouth 3 (three) times daily as needed for dizziness. (Patient not taking: Reported on 07/13/2023)    No facility-administered medications prior to visit.    Review of Systems     Objective    BP (!) 152/77 (BP Location: Left Arm, Patient Position: Sitting, Cuff Size: Large)   Pulse 73   Ht 5\' 7"  (1.702 m)   Wt 215 lb 12.8 oz (97.9 kg)   SpO2 98%   BMI 33.80 kg/m    Physical Exam Vitals reviewed.  Constitutional:      General: She is not in acute distress.    Appearance: Normal appearance. She is well-developed. She is not diaphoretic.  HENT:     Head: Normocephalic and atraumatic.  Eyes:     General: No scleral icterus.    Conjunctiva/sclera: Conjunctivae normal.  Neck:     Thyroid: No thyromegaly.  Cardiovascular:     Rate and Rhythm: Normal rate and regular rhythm.     Heart sounds: Normal heart sounds. No murmur heard. Pulmonary:     Effort: Pulmonary effort is normal. No respiratory distress.     Breath sounds: Normal breath sounds. No wheezing, rhonchi or rales.  Abdominal:     General: There is no distension.     Palpations: Abdomen is soft.     Tenderness: There is no abdominal tenderness.  Musculoskeletal:     Cervical back: Neck supple.     Right lower leg: No edema.     Left lower leg: No edema.  Lymphadenopathy:     Cervical: No  cervical adenopathy.  Skin:    General: Skin is warm and dry.  Neurological:     Mental Status: She is alert. Mental status is at baseline.  Psychiatric:        Mood and Affect: Mood normal.        Behavior: Behavior normal.      No results found for any visits on 07/13/23.  Assessment & Plan     Problem List Items Addressed This Visit       Cardiovascular and Mediastinum   Chronic diastolic CHF (congestive heart failure) (HCC)  Persistent atrial fibrillation (HCC)   Relevant Orders   AMB Referral VBCI Care Management   Other Visit Diagnoses       Diverticulitis    -  Primary       Assessment and Plan    Diverticulitis Acute episode with significant improvement on antibiotics. No complications on CT. Discussed diverticulitis pathophysiology and dietary misconceptions. Advised gradual reintroduction of normal diet, avoiding spicy foods initially. Encouraged to attend planned trip to [place]. - Continue antibiotics as prescribed - Gradually reintroduce normal diet, avoiding spicy foods initially - Consider lactose-free yogurt to restore gut flora  Benign Paroxysmal Positional Vertigo (BPPV) Dizziness and unsteadiness improved with Epley maneuver performed by a friend. Prefers friend's assistance over professional physical therapy. - Continue Epley maneuver with friend's assistance  Heart Failure with Preserved Ejection Fraction (HFpEF) On Farxiga. Issues with clinic management and communication. Discussed medication adherence and Farxiga's role. Inquiry about switching from Eliquis to baby aspirin deferred to cardiology. - Continue Marcelline Deist as prescribed - Discuss with cardiology the possibility of switching to baby aspirin for atrial fibrillation  Atrial Fibrillation On Eliquis 5 mg twice daily. Discussed anticoagulation importance to prevent stroke. Using leftover Eliquis from friends due to cost issues, which is not recommended. - Check for Eliquis samples -  Pharmacy team to contact regarding Eliquis  General Health Maintenance Discussed importance of a fiber-rich diet. Advised gradual reintroduction of normal diet post-diverticulitis. - Gradually reintroduce normal diet, focusing on fiber-rich foods - Consider lactose-free yogurt to restore gut flora  Follow-up - Follow-up appointment in one month.        Return if symptoms worsen or fail to improve.       Shirlee Latch, MD  [facility] [phone] (phone) [phone] (fax)  [facility]

## 2023-07-17 ENCOUNTER — Telehealth: Payer: Self-pay

## 2023-07-17 NOTE — Progress Notes (Signed)
 Care Guide Pharmacy Note  07/17/2023 Name: Lauren Lloyd MRN: [id number] DOB: 1940/07/06  Referred By: Erasmo Downer, MD Reason for referral: Care Coordination (Outreach to schedule with Pharm d )   Lauren Lloyd is a 83 y.o. year old female who is a primary care patient of Erasmo Downer, MD.  [name] was referred to the pharmacist for assistance related to: Atrial Fibrillation  Successful contact was made with the patient to discuss pharmacy services including being ready for the pharmacist to call at least 5 minutes before the scheduled appointment time and to have medication bottles and any blood pressure readings ready for review. The patient agreed to meet with the pharmacist via telephone visit on (date/time).07/28/2023  Penne Lash , RMA     [place]  [facility] Guide  Direct Dial: [phone]  Website: [email or web address]

## 2023-07-18 ENCOUNTER — Telehealth (HOSPITAL_COMMUNITY): Payer: Self-pay

## 2023-07-18 ENCOUNTER — Ambulatory Visit: Payer: Medicare Other

## 2023-07-18 NOTE — Telephone Encounter (Signed)
 Spoke with the patient, she'll be here for her test. Lauren Lloyd CCT

## 2023-07-20 ENCOUNTER — Ambulatory Visit: Payer: Medicare HMO | Admitting: Pulmonary Disease

## 2023-07-20 ENCOUNTER — Ambulatory Visit: Payer: Medicare Other

## 2023-07-24 ENCOUNTER — Encounter: Payer: Medicare Other | Attending: Cardiovascular Disease | Admitting: *Deleted

## 2023-07-24 DIAGNOSIS — I5032 Chronic diastolic (congestive) heart failure: Secondary | ICD-10-CM | POA: Diagnosis not present

## 2023-07-24 NOTE — Progress Notes (Signed)
 Daily Session Note  Patient Details  Name: Lauren Lloyd MRN: [id number] Date of Birth: [birth year] Referring Provider:   Flowsheet Row Pulmonary Rehab from 02/13/2023 in [facility] Cardiac [facility] Pul[facility]  Referring Provider Julien Nordmann, MD       Encounter Date: 07/24/2023  Check In:  Session Check In - 07/24/23 1545       Check-In   Supervising physician immediately available to respond to emergencies See telemetry face sheet for immediately available ER MD    Location [facility]-Cardiac & Pulmonary Rehab    Staff Present Susann Givens RN,BSN;Joseph Hollace Kinnier;Cora Collum, RN, BSN, CCRP    Virtual Visit No    Medication changes reported     No    Fall or balance concerns reported    No    Warm-up and Cool-down Performed on first and last piece of equipment    Resistance Training Performed Yes    VAD Patient? No    PAD/SET Patient? No      Pain Assessment   Currently in Pain? No/denies                Social History   Tobacco Use  Smoking Status Former   Current packs/day: 0.00   Average packs/day: 1 pack/day for 30.0 years (30.0 ttl pk-yrs)   Types: Cigarettes   Start date: 05/17/1959   Quit date: 05/16/1989   Years since quitting: 34.2   Passive exposure: Past  Smokeless Tobacco Never    Goals Met:  Independence with exercise equipment Exercise tolerated well No report of concerns or symptoms today Strength training completed today  Goals Unmet:  Not Applicable  Comments: Pt able to follow exercise prescription today without complaint.  Will continue to monitor for progression.    Dr. Bethann Punches is Medical Director for [facility] Cardiac Rehabilitation.  Dr. Vida Rigger is Medical Director for [facility] Pulmonary Rehabilitation.

## 2023-07-25 ENCOUNTER — Ambulatory Visit (HOSPITAL_COMMUNITY)
Admission: RE | Admit: 2023-07-25 | Discharge: 2023-07-25 | Disposition: A | Discharge status: Home / Self Care | Payer: Medicare Other | Source: Ambulatory Visit | Attending: Cardiology | Admitting: Cardiology

## 2023-07-25 ENCOUNTER — Encounter (HOSPITAL_COMMUNITY): Payer: Self-pay | Admitting: Cardiology

## 2023-07-25 ENCOUNTER — Other Ambulatory Visit (HOSPITAL_COMMUNITY): Payer: Self-pay

## 2023-07-25 ENCOUNTER — Telehealth (HOSPITAL_COMMUNITY): Payer: Self-pay | Admitting: Pharmacist

## 2023-07-25 ENCOUNTER — Ambulatory Visit: Payer: Medicare Other

## 2023-07-25 ENCOUNTER — Ambulatory Visit (HOSPITAL_BASED_OUTPATIENT_CLINIC_OR_DEPARTMENT_OTHER): Payer: Medicare Other

## 2023-07-25 VITALS — BP 124/60 | HR 70 | Wt 215.0 lb

## 2023-07-25 DIAGNOSIS — Z7984 Long term (current) use of oral hypoglycemic drugs: Secondary | ICD-10-CM | POA: Insufficient documentation

## 2023-07-25 DIAGNOSIS — D472 Monoclonal gammopathy: Secondary | ICD-10-CM | POA: Diagnosis not present

## 2023-07-25 DIAGNOSIS — I429 Cardiomyopathy, unspecified: Secondary | ICD-10-CM | POA: Insufficient documentation

## 2023-07-25 DIAGNOSIS — R0789 Other chest pain: Secondary | ICD-10-CM | POA: Diagnosis not present

## 2023-07-25 DIAGNOSIS — Z79899 Other long term (current) drug therapy: Secondary | ICD-10-CM | POA: Insufficient documentation

## 2023-07-25 DIAGNOSIS — I110 Hypertensive heart disease with heart failure: Secondary | ICD-10-CM | POA: Insufficient documentation

## 2023-07-25 DIAGNOSIS — I5032 Chronic diastolic (congestive) heart failure: Secondary | ICD-10-CM | POA: Insufficient documentation

## 2023-07-25 DIAGNOSIS — E669 Obesity, unspecified: Secondary | ICD-10-CM | POA: Insufficient documentation

## 2023-07-25 DIAGNOSIS — G4733 Obstructive sleep apnea (adult) (pediatric): Secondary | ICD-10-CM | POA: Insufficient documentation

## 2023-07-25 DIAGNOSIS — J449 Chronic obstructive pulmonary disease, unspecified: Secondary | ICD-10-CM | POA: Diagnosis not present

## 2023-07-25 DIAGNOSIS — I4821 Permanent atrial fibrillation: Secondary | ICD-10-CM | POA: Diagnosis not present

## 2023-07-25 DIAGNOSIS — Z87891 Personal history of nicotine dependence: Secondary | ICD-10-CM | POA: Insufficient documentation

## 2023-07-25 LAB — MYOCARDIAL AMYLOID PLANAR & SPECT: H/CL Ratio: 1.4

## 2023-07-25 MED ORDER — TECHNETIUM TC 99M PYROPHOSPHATE
20.6000 | Freq: Once | INTRAVENOUS | Status: AC
Start: 2023-07-25 — End: 2023-07-25
  Administered 2023-07-25: 20.6 via INTRAVENOUS

## 2023-07-25 NOTE — Patient Instructions (Addendum)
 Our pharmacy team will reach out to you once your Tafamadis Paper work is complete.  Genetic testing has been collected, this has to be sent to [facility] for processing and can take 1-2 weeks for [name] to get results back.  We will let you know the results once reviewed by your provider.  You have been referred to Hematology. They will call you to arrange your appointment.  Your physician recommends that you schedule a follow-up appointment in: 2 months ( May) ** PLEASE CALL THE OFFICE IN 3 WEEKS TO ARRANGE YOUR FOLLOW UP APPOINTMENT.**  If you have any questions or concerns before your next appointment please send [name] a message through [place] or call our office at [phone].    TO LEAVE A MESSAGE FOR THE NURSE SELECT OPTION 2, PLEASE LEAVE A MESSAGE INCLUDING: YOUR NAME DATE OF BIRTH CALL BACK NUMBER REASON FOR CALL**this is important as we prioritize the call backs  YOU WILL RECEIVE A CALL BACK THE SAME DAY AS LONG AS YOU CALL BEFORE 4:00 PM  At the [facility], you and your health needs are our priority. As part of our continuing mission to provide you with exceptional heart care, we have created designated Provider Care Teams. These Care Teams include your primary Cardiologist (physician) and Advanced Practice Providers (APPs- Physician Assistants and Nurse Practitioners) who all work together to provide you with the care you need, when you need it.   You may see any of the following providers on your designated Care Team at your next follow up: Dr Arvilla Meres Dr Marca Ancona Dr. Dorthula Nettles Dr. Clearnce Hasten Amy Filbert Schilder, NP Robbie Lis, [place] [facility] [place], [place] Brynda Peon, NP [place] Lee, NP Clarisa Kindred, NP Karle Plumber, PharmD Enos Fling, PharmD   Please be sure to bring in all your medications bottles to every appointment.    Thank you for choosing [facility]

## 2023-07-25 NOTE — Telephone Encounter (Signed)
 Advanced [facility] Failure Patient Advocate Encounter  Attempted to submit Prior Authorization for V[employer] via CoverMyMeds but received the following information: Prior Authorization Not Required (Key [id number]).  Completed copay check. Cost is $1979.61/month. Patient will need grant.    Karle Plumber, PharmD, BCPS, BCCP, CPP Heart Failure Clinic Pharmacist [phone]

## 2023-07-25 NOTE — Progress Notes (Signed)
 TTR genetic testing collected via BLOOD per Dr Elwyn Lade.  Order form completed, signed and shipped with sample by [employer] to [facility].

## 2023-07-25 NOTE — Progress Notes (Signed)
   ADVANCED HEART FAILURE NEW PATIENT CLINIC NOTE  Referring Physician: Erasmo Downer, MD  Primary Care: Erasmo Downer, MD Primary Cardiologist: Dr. Mariah Milling  HPI: OSA Lauren Lloyd is a 83 y.o. female with a PMH of HFpEF, permanent atrial fibrillation s/p AV nodal ablation, HTN, OSA, COPD who presents for initial visit for further evaluation and treatment of heart failure/cardiomyopathy.     83 y.o. female with history of permanent atrial fibrillation, PPM 06/23 followed by AV nodal ablation 02/24, chronic diastolic CHF, HTN, OSA on CPAP, prior tobacco use/COPD, chronic dyspnea.  Last echo 05/24: EF 60-65%, RV okay, RVSP 38 mmHg, moderate TR  She was seen in the ED 05/26/23 for abnormal labs at PCP visit. Had noted orthostatic dizziness and dyspnea with exertion. HS troponin mildly elevated at 16 and BUN also elevated. She was hypertensive but vitals otherwise stable.  Troponin trend and ECG not c/w ACS. CXR with some evidence of CHF. She was given an extra dose of PO lasix. Felt to be stable for discharge home and was referred to Cardiology and Advanced Heart Failure clinic.     SUBJECTIVE: Patient reports ongoing symptoms of shortness of breath with mild to moderate exertion.  Previously she had been fairly healthy but for the past few months she has noticed difficulty with walking more than a few steps buphill. She has a recent history of unsteadiness and neuropathy, as well as recent dx of carpal tunnel syndrome. She feels worse after taking her bisoprolol. She did not notice much of a change in her energy level after the BiV pacer.   PMH, current medications, allergies, social history, and family history reviewed in epic.  PHYSICAL EXAM: Vitals:   07/25/23 1329  BP: 124/60  Pulse: 70  SpO2: 94%   GENERAL: Well nourished and in no apparent distress at rest.  HEENT: The mucous membranes are pink and moist.   PULM:  Normal work of breathing, clear to auscultation  bilaterally. Respirations are unlabored.  CARDIAC:  JVP: mildly elevated         Normal rate with regular rhythm. No murmurs, rubs or gallops.  1+ edema.  ABDOMEN: Soft, non-tender, non-distended. NEUROLOGIC: Patient is oriented x3 with no focal or lateralizing neurologic deficits.  PSYCH: Patients affect is appropriate, there is no evidence of anxiety or depression.  SKIN: Warm and dry; no lesions or wounds. Warm and well perfused extremities.  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 Will stop bisoprolol given BiV pacing and normal EF, start SGLT-2 for augmentation of diuresis, and plan for TTR amyloid workup. If negative can pursue BP control with entresto/spironolactone. - Continue lasix 40mg  daily - Start farxiga 10mg  daily - Stop bisoprolol - PYP scan, AL lab workup - Close follow up  Permanent atrial fibrillation: S/p BiV pacer, prior LBBB. Does not appear to have worsened exercise capacity, pacer working well. - EP follow up  HTN: Better controlled at home. - [employer] as above  Obesity: Complicates care.  Clearnce Hasten, MD Advanced Heart Failure Mechanical Circulatory Support 07/25/23

## 2023-07-26 ENCOUNTER — Encounter: Payer: Medicare Other | Admitting: *Deleted

## 2023-07-26 ENCOUNTER — Ambulatory Visit (INDEPENDENT_AMBULATORY_CARE_PROVIDER_SITE_OTHER): Payer: Medicare HMO

## 2023-07-26 DIAGNOSIS — I495 Sick sinus syndrome: Secondary | ICD-10-CM

## 2023-07-26 DIAGNOSIS — I5032 Chronic diastolic (congestive) heart failure: Secondary | ICD-10-CM | POA: Diagnosis not present

## 2023-07-26 LAB — CUP PACEART REMOTE DEVICE CHECK
Battery Remaining Longevity: 144 mo
Battery Remaining Percentage: 100 %
Brady Statistic RA Percent Paced: 0 %
Brady Statistic RV Percent Paced: 97 %
Date Time Interrogation Session: 20250312041100
Implantable Lead Connection Status: 753985
Implantable Lead Connection Status: 753985
Implantable Lead Implant Date: 20230612
Implantable Lead Implant Date: 20230612
Implantable Lead Location: 753859
Implantable Lead Location: 753860
Implantable Lead Model: 7841
Implantable Lead Model: 7842
Implantable Lead Serial Number: 1182863
Implantable Lead Serial Number: 1274488
Implantable Pulse Generator Implant Date: 20230612
Lead Channel Impedance Value: 550 Ohm
Lead Channel Impedance Value: 690 Ohm
Lead Channel Pacing Threshold Amplitude: 0.7 V
Lead Channel Pacing Threshold Pulse Width: 0.4 ms
Lead Channel Setting Pacing Amplitude: 1.2 V
Lead Channel Setting Pacing Pulse Width: 0.4 ms
Lead Channel Setting Sensing Sensitivity: 3.5 mV
Pulse Gen Serial Number: 111954
Zone Setting Status: 755011

## 2023-07-26 NOTE — Progress Notes (Signed)
 Daily Session Note  Patient Details  Name: Lauren Lloyd MRN: [id number] Date of Birth: 1940/05/21 Referring Provider:   Flowsheet Row Pulmonary Rehab from 02/13/2023 in [facility]  Referring Provider Julien Nordmann, MD       Encounter Date: 07/26/2023  Check In:  Session Check In - 07/26/23 1541       Check-In   Supervising physician immediately available to respond to emergencies See telemetry face sheet for immediately available ER MD    Location [facility]-Cardiac & Pulmonary Rehab    Staff Present Susann Givens RN,BSN;Joseph Hollace Kinnier;Cora Collum, RN, BSN, [employer], ACSM CEP, Exercise Physiologist    Virtual Visit No    Medication changes reported     No    Fall or balance concerns reported    No    Warm-up and Cool-down Performed on first and last piece of equipment    Resistance Training Performed Yes    VAD Patient? No    PAD/SET Patient? No      Pain Assessment   Currently in Pain? No/denies                Social History   Tobacco Use  Smoking Status Former   Current packs/day: 0.00   Average packs/day: 1 pack/day for 30.0 years (30.0 ttl pk-yrs)   Types: Cigarettes   Start date: 05/17/1959   Quit date: 05/16/1989   Years since quitting: 34.2   Passive exposure: Past  Smokeless Tobacco Never    Goals Met:  Independence with exercise equipment Exercise tolerated well No report of concerns or symptoms today Strength training completed today  Goals Unmet:  Not Applicable  Comments: Pt able to follow exercise prescription today without complaint.  Will continue to monitor for progression.    Dr. Bethann Punches is Medical Director for [facility] Cardiac Rehabilitation.  Dr. Vida Rigger is Medical Director for [facility] Pulmonary Rehabilitation.

## 2023-07-26 NOTE — Progress Notes (Signed)
 Pulmonary Individual Treatment Plan  Patient Details  Name: SARGUN RUMMELL MRN: [id number] Date of Birth: 1941-01-27 Referring Provider:   Flowsheet Row Pulmonary Rehab from 02/13/2023 in [facility] Cardiac and Pulmonary Rehab  Referring Provider Julien Nordmann, MD       Initial Encounter Date:  Flowsheet Row Pulmonary Rehab from 02/13/2023 in [facility] Pulmonary Re[facility]  Date 02/13/23       Visit Diagnosis: Heart failure, diastolic, chronic (HCC)  Patient's Home Medications on Admission:  Current Outpatient Medications:    allopurinol (ZYLOPRIM) 100 MG tablet, TAKE ONE TABLET BY MOUTH EVERY DAY, Disp: 90 tablet, Rfl: 1   calcium carbonate (TUMS EX) 750 MG chewable tablet, Chew 2 tablets by mouth daily as needed for heartburn., Disp: , Rfl:    Cholecalciferol 25 MCG (1000 UT) tablet, Take 1,000 Units by mouth daily., Disp: , Rfl:    dapagliflozin propanediol (FARXIGA) 10 MG TABS tablet, Take 1 tablet (10 mg total) by mouth daily before breakfast., Disp: 30 tablet, Rfl: 6   ELIQUIS 5 MG TABS tablet, TAKE ONE TABLET TWICE DAILY, Disp: 180 tablet, Rfl: 1   ezetimibe (ZETIA) 10 MG tablet, TAKE 1 TABLET BY MOUTH DAILY, Disp: 90 tablet, Rfl: 3   furosemide (LASIX) 40 MG tablet, TAKE 1 TABLET BY MOUTH DAILY. TAKE AN EXTRA TABLET AS NEEDED AFTER LUNCH FOR ABDOMINAL SWELLING, LEG SWELLING OR SHORTNESS OF BREATH, Disp: 180 tablet, Rfl: 1   gabapentin (NEURONTIN) 600 MG tablet, TAKE ONE TABLET BY MOUTH AT BEDTIME, Disp: 90 tablet, Rfl: 0   Magnesium 300 MG CAPS, Take by mouth., Disp: , Rfl:    Menthol, Topical Analgesic, (BIOFREEZE EX), Apply 1 application. topically daily as needed (Neck pain)., Disp: , Rfl:    metaxalone (SKELAXIN) 800 MG tablet, Take 1 tablet (800 mg total) by mouth daily as needed for muscle spasms., Disp: 30 tablet, Rfl: 2   Multiple Vitamin (MULTIVITAMIN) capsule, Take 1 capsule by mouth daily., Disp: , Rfl:    mupirocin ointment (BACTROBAN) 2 %, Place 1 application  into  the nose 2 (two) times daily., Disp: 22 g, Rfl: 0   potassium chloride (KLOR-CON) 10 MEQ tablet, TAKE 1 TABLET BY MOUTH DAILY, Disp: 30 tablet, Rfl: 5   Tiotropium Bromide-Olodaterol (STIOLTO RESPIMAT) 2.5-2.5 MCG/ACT AERS, Inhale 2 puffs into the lungs daily., Disp: 4 g, Rfl: 0  Past Medical History: Past Medical History:  Diagnosis Date   (HFpEF) heart failure with preserved ejection fraction (HCC)    a. 05/2018 Echo: EF 55-60%, no rwma, mild to mod MR. Nl RV fxn. Mod TR. PASP .   Arthritis    knees, Hands   Arthritis of knee    Back pain    Carotid arterial disease (HCC)    a. 03/2019 Carotid U/S: <50% bilat ICA stenoses.   CHF (congestive heart failure) (HCC)    Cholelithiasis    a. 10/2018 noted on CT.   Edema, lower extremity    Fatty liver    GERD (gastroesophageal reflux disease)    History of stress test    a. 06/2018 MV: EF 59%, no ischemia/infarct. Low risk.   Hypertension    Knee pain    Lactose intolerance    Mitral regurgitation    a. 05/2018 Echo: mild to mod MR.   Multinodular goiter    Obesity    OSA (obstructive sleep apnea)    PAF (paroxysmal atrial fibrillation) (HCC)    a.  Diagnosed 12/19; b. 05/2018 s/p DCCV; c. 03/2019 &  05/2019 recurrent AFib-->managed w/ amio load; d. CHADS2VASc = 6 (CHF, HTN, age x 2, vascular disease, female)-->Eliquis & amio 100 qd.   PAH (pulmonary artery hypertension) (HCC)    RSV (acute bronchiolitis due to respiratory syncytial virus) 05/10/2022   Scoliosis    SOB (shortness of breath)    Swallowing difficulty     Tobacco Use: Social History   Tobacco Use  Smoking Status Former   Current packs/day: 0.00   Average packs/day: 1 pack/day for 30.0 years (30.0 ttl pk-yrs)   Types: Cigarettes   Start date: 05/17/1959   Quit date: 05/16/1989   Years since quitting: 34.2   Passive exposure: Past  Smokeless Tobacco Never    Labs: Review Flowsheet  More data exists      Latest Ref Rng & Units 12/09/2019 08/03/2020 08/16/2021  03/17/2022 10/28/2022  Labs for ITP Cardiac and Pulmonary Rehab  Cholestrol 100 - 199 mg/dL 161  096  CANCELED  045  176   LDL (calc) 0 - 99 mg/dL 409  811  - 93  914   HDL-C >39 mg/dL 73  65  CANCELED  52  54   Trlycerides 0 - 149 mg/dL 782  956  CANCELED  94  126   Hemoglobin A1c 4.8 - 5.6 % 5.5  - - 6.0  6.0      Pulmonary Assessment Scores:  Pulmonary Assessment Scores     Row Name 02/13/23 1645         ADL UCSD   ADL Phase Entry     SOB Score total 57     Rest 0     Walk 3     Stairs 4     Bath 1     Dress 1     Shop 1       CAT Score   CAT Score 23       mMRC Score   mMRC Score 2              UCSD: Self-administered rating of dyspnea associated with activities of daily living (ADLs) 6-point scale (0 = "not at all" to 5 = "maximal or unable to do because of breathlessness")  Scoring Scores range from 0 to 120.  Minimally important difference is 5 units  CAT: CAT can identify the health impairment of COPD patients and is better correlated with disease progression.  CAT has a scoring range of zero to 40. The CAT score is classified into four groups of low (less than 10), medium (10 - 20), high (21-30) and very high (31-40) based on the impact level of disease on health status. A CAT score over 10 suggests significant symptoms.  A worsening CAT score could be explained by an exacerbation, poor medication adherence, poor inhaler technique, or progression of COPD or comorbid conditions.  CAT MCID is 2 points  mMRC: mMRC (Modified Medical Research Council) Dyspnea Scale is used to assess the degree of baseline functional disability in patients of respiratory disease due to dyspnea. No minimal important difference is established. A decrease in score of 1 point or greater is considered a positive change.   Pulmonary Function Assessment:   Exercise Target Goals: Exercise Program Goal: Individual exercise prescription set using results from initial 6 min walk test  and THRR while considering  patient's activity barriers and safety.   Exercise Prescription Goal: Initial exercise prescription builds to 30-45 minutes a day of aerobic activity, 2-3 days per week.  Home exercise guidelines will be  given to patient during program as part of exercise prescription that the participant will acknowledge.  Education: Aerobic Exercise: - Group verbal and visual presentation on the components of exercise prescription. Introduces F.I.T.T principle from ACSM for exercise prescriptions.  Reviews F.I.T.T. principles of aerobic exercise including progression. Written material given at graduation.   Education: Resistance Exercise: - Group verbal and visual presentation on the components of exercise prescription. Introduces F.I.T.T principle from ACSM for exercise prescriptions  Reviews F.I.T.T. principles of resistance exercise including progression. Written material given at graduation.    Education: Exercise & Equipment Safety: - Individual verbal instruction and demonstration of equipment use and safety with use of the equipment. Flowsheet Row Pulmonary Rehab from 04/27/2023 in [facility] Cardiac and Pulmonary Rehab  Date 02/13/23  Educator MB  Instruction Review Code 1- Verbalizes Understanding       Education: Exercise Physiology & General Exercise Guidelines: - Group verbal and written instruction with models to review the exercise physiology of the cardiovascular system and associated critical values. Provides general exercise guidelines with specific guidelines to those with heart or lung disease.  Flowsheet Row Pulmonary Rehab from 04/27/2023 in [facility] Cardiac and Pulmonary Rehab  Education need identified 02/13/23       Education: Flexibility, Balance, Mind/Body Relaxation: - Group verbal and visual presentation with interactive activity on the components of exercise prescription. Introduces F.I.T.T principle from ACSM for exercise prescriptions. Reviews  F.I.T.T. principles of flexibility and balance exercise training including progression. Also discusses the mind body connection.  Reviews various relaxation techniques to help reduce and manage stress (i.e. Deep breathing, progressive muscle relaxation, and visualization). Balance handout provided to take home. Written material given at graduation.   Activity Barriers & Risk Stratification:  Activity Barriers & Cardiac Risk Stratification - 02/13/23 1640       Activity Barriers & Cardiac Risk Stratification   Activity Barriers Arthritis;Back Problems;Shortness of Breath             6 Minute Walk:  6 Minute Walk     Row Name 02/13/23 1638         6 Minute Walk   Phase Initial     Distance 945 feet     Walk Time 6 minutes     # of Rest Breaks 0     MPH 1.79     METS 1.54     RPE 15     Perceived Dyspnea  3     VO2 Peak 5.41     Symptoms Yes (comment)     Comments back pain     Resting HR 70 bpm     Resting BP 130/74     Resting Oxygen Saturation  92 %     Exercise Oxygen Saturation  during 6 min walk 91 %     Max Ex. HR 111 bpm     Max Ex. BP 166/72     2 Minute Post BP 150/74       Interval HR   1 Minute HR 103     2 Minute HR 107     3 Minute HR 109     4 Minute HR 111     5 Minute HR 82     6 Minute HR 83     2 Minute Post HR 70     Interval Heart Rate? Yes       Interval Oxygen   Interval Oxygen? Yes     Baseline Oxygen Saturation % 92 %  1 Minute Oxygen Saturation % 91 %     1 Minute Liters of Oxygen 0 L     2 Minute Oxygen Saturation % 92 %     2 Minute Liters of Oxygen 0 L     3 Minute Oxygen Saturation % 91 %     3 Minute Liters of Oxygen 0 L     4 Minute Oxygen Saturation % 91 %     4 Minute Liters of Oxygen 0 L     5 Minute Oxygen Saturation % 95 %     5 Minute Liters of Oxygen 0 L     6 Minute Oxygen Saturation % 93 %     6 Minute Liters of Oxygen 0 L     2 Minute Post Oxygen Saturation % 96 %     2 Minute Post Liters of Oxygen 0 L              Oxygen Initial Assessment:   Oxygen Re-Evaluation:  Oxygen Re-Evaluation     Row Name 02/16/23 0813 03/02/23 0802 04/04/23 0850 05/30/23 0814 06/08/23 0844     Program Oxygen Prescription   Program Oxygen Prescription -- None None None None     Home Oxygen   Home Oxygen Device -- None None None None   Sleep Oxygen Prescription -- CPAP CPAP CPAP CPAP   Liters per minute -- -- --  off and on --  off and on --  off and on   Home Exercise Oxygen Prescription -- None None None None   Home Resting Oxygen Prescription -- None None None None   Compliance with Home Oxygen Use -- No No No No     Goals/Expected Outcomes   Short Term Goals -- To learn and demonstrate proper pursed lip breathing techniques or other breathing techniques.  To learn and demonstrate proper pursed lip breathing techniques or other breathing techniques.  To learn and demonstrate proper pursed lip breathing techniques or other breathing techniques.  To learn and demonstrate proper pursed lip breathing techniques or other breathing techniques.    Long  Term Goals -- Exhibits proper breathing techniques, such as pursed lip breathing or other method taught during program session Exhibits proper breathing techniques, such as pursed lip breathing or other method taught during program session Exhibits proper breathing techniques, such as pursed lip breathing or other method taught during program session Exhibits proper breathing techniques, such as pursed lip breathing or other method taught during program session   Comments Reviewed PLB technique with pt.  Talked about how it works and it's importance in maintaining their exercise saturations. Informed patient how to perform the Pursed Lipped breathing technique. Told patient to Inhale through the nose and out the mouth with pursed lips to keep their airways open, help oxygenate them better, practice when at rest or doing strenuous activity. Patient Verbalizes  understanding of technique and will work on and be reiterated during [facility]. Reviewed pursed lipped breathing with patient. She verbalized understanding and will continue to work on it during rest and during exercise. Checked in with Leniyah to see how her pursed lipped breathing is going with rest and exercise after the goal set last review. She has been adding it in more when she is noticing shortness of breath, such as walking from the parking lot to rehab. Discussed to practice it each time she remembers to do pursed lipped breathing to build the habit. Alisa is still working on her goal to use  pursed lipped breathing at rest and exercise when she remembers and is short of breath. She has noticed she is only short of breath when walking and walking uphill especially.   Goals/Expected Outcomes Short: Become more profiecient at using PLB. Long: Become independent at using PLB. Short: use PLB with exertion. Long: use PLB on exertion proficiently and independently. Short: use pursed lipped breathing with rest and exercise. Long: use pursed lipped breathing independently with exercise. Short: Consistently use pursed lipped breathing with rest and exercise when she becomes short of breath. Long: use pursed lipped breathing independently with exercise and rest. Short: Consistently use pursed lipped breathing with rest and exercise. Long: use pursed lipped breathing independently with exercise and rest.    Row Name 07/24/23 1620             Program Oxygen Prescription   Program Oxygen Prescription None         Home Oxygen   Home Oxygen Device None       Sleep Oxygen Prescription CPAP       Home Exercise Oxygen Prescription None       Home Resting Oxygen Prescription None       Compliance with Home Oxygen Use No         Goals/Expected Outcomes   Short Term Goals To learn and demonstrate proper use of respiratory medications       Long  Term Goals Exhibits proper breathing techniques, such as pursed  lip breathing or other method taught during program session       Comments Rae continues to work on her breathing techniques.  She states that she gets short of breath when she is going up inclines.  She moves slow and flat when she can to prevent any shortness of breath       Goals/Expected Outcomes STG continue to use breathing techniques, taking time to moveLTG manages breathing and decreases shortness of breath as needed                Oxygen Discharge (Final Oxygen Re-Evaluation):  Oxygen Re-Evaluation - 07/24/23 1620       Program Oxygen Prescription   Program Oxygen Prescription None      Home Oxygen   Home Oxygen Device None    Sleep Oxygen Prescription CPAP    Home Exercise Oxygen Prescription None    Home Resting Oxygen Prescription None    Compliance with Home Oxygen Use No      Goals/Expected Outcomes   Short Term Goals To learn and demonstrate proper use of respiratory medications    Long  Term Goals Exhibits proper breathing techniques, such as pursed lip breathing or other method taught during program session    Comments Shakyia continues to work on her breathing techniques.  She states that she gets short of breath when she is going up inclines.  She moves slow and flat when she can to prevent any shortness of breath    Goals/Expected Outcomes STG continue to use breathing techniques, taking time to moveLTG manages breathing and decreases shortness of breath as needed             Initial Exercise Prescription:  Initial Exercise Prescription - 02/13/23 1600       Date of Initial Exercise RX and Referring Provider   Date 02/13/23    Referring Provider Julien Nordmann, MD      Oxygen   Maintain Oxygen Saturation 88% or higher      Recumbant Bike  Level 1    RPM 50    Watts 14    Minutes 15    METs 1.54      NuStep   Level 1    SPM 80    Minutes 15    METs 1.54      Biostep-RELP   Level 1    SPM 50    Minutes 15    METs 1.54      Track    Laps 8    Minutes 15    METs 1.44      Prescription Details   Frequency (times per week) 2    Duration Progress to 30 minutes of continuous aerobic without signs/symptoms of physical distress      Intensity   THRR 40-80% of Max Heartrate 96-123    Ratings of Perceived Exertion 11-13    Perceived Dyspnea 0-4      Progression   Progression Continue to progress workloads to maintain intensity without signs/symptoms of physical distress.      Resistance Training   Training Prescription Yes    Weight 5lb    Reps 10-15             Perform Capillary Blood Glucose checks as needed.  Exercise Prescription Changes:   Exercise Prescription Changes     Row Name 02/13/23 1600 02/22/23 1100 03/09/23 0800 03/22/23 1600 04/06/23 1500     Response to Exercise   Blood Pressure (Admit) 130/74 146/74 164/82 140/70 150/74   Blood Pressure (Exercise) 166/72 116/70 148/62 134/60 122/60   Blood Pressure (Exit) 130/74 130/76 120/58 128/56 126/64   Heart Rate (Admit) 70 bpm 70 bpm 72 bpm 75 bpm 91 bpm   Heart Rate (Exercise) 111 bpm 81 bpm 123 bpm 128 bpm 91 bpm   Heart Rate (Exit) 70 bpm 70 bpm 72 bpm 70 bpm 77 bpm   Oxygen Saturation (Admit) 92 % 96 % 99 % 99 % 91 %   Oxygen Saturation (Exercise) 91 % 92 % 92 % 92 % 91 %   Oxygen Saturation (Exit) 94 % 95 % 95 % 97 % 96 %   Rating of Perceived Exertion (Exercise) 15 14 13 14 13    Perceived Dyspnea (Exercise) 3 3 2 1  0   Symptoms back pain none none none knee pain   Comments results -- -- -- --   Duration Progress to 30 minutes of  aerobic without signs/symptoms of physical distress Progress to 30 minutes of  aerobic without signs/symptoms of physical distress Progress to 30 minutes of  aerobic without signs/symptoms of physical distress Progress to 30 minutes of  aerobic without signs/symptoms of physical distress Progress to 30 minutes of  aerobic without signs/symptoms of physical distress   Intensity THRR New THRR unchanged THRR  unchanged THRR unchanged THRR unchanged     Progression   Progression Continue to progress workloads to maintain intensity without signs/symptoms of physical distress. Continue to progress workloads to maintain intensity without signs/symptoms of physical distress. Continue to progress workloads to maintain intensity without signs/symptoms of physical distress. Continue to progress workloads to maintain intensity without signs/symptoms of physical distress. Continue to progress workloads to maintain intensity without signs/symptoms of physical distress.   Average METs 1.54 2.05 1.97 2.07 2.78     Resistance Training   Training Prescription -- Yes Yes Yes Yes   Weight -- 5lb 5lb 5lb 5lb   Reps -- 10-15 10-15 10-15 10-15     Interval Training   Interval  Training -- No No No No     Treadmill   MPH -- -- -- 1.4 --   Grade -- -- -- 0 --   Minutes -- -- -- 15 --   METs -- -- -- 2.07 --     Recumbant Bike   Level -- -- -- -- 1   Watts -- -- -- -- 14   Minutes -- -- -- -- 15   METs -- -- -- -- 2.45     NuStep   Level -- -- 2 2 3    Minutes -- -- 15 15 15    METs -- -- 2.1 2.6 3.1     Biostep-RELP   Level -- 1 1 1  --   Minutes -- 15 15 15  --   METs -- 2 2 2  --     Track   Laps -- 20  Hallway 15  Hallway 17  hallway --   Minutes -- 15 15 15  --   METs -- 2.09 1.82 1.92 --     Oxygen   Maintain Oxygen Saturation -- 88% or higher 88% or higher 88% or higher 88% or higher    Row Name 04/17/23 1100 04/20/23 0700 05/04/23 1400 05/18/23 1600 05/29/23 1600     Response to Exercise   Blood Pressure (Admit) 142/72 -- 148/80 128/68 128/68   Blood Pressure (Exercise) 158/80 -- -- -- --   Blood Pressure (Exit) 144/70 -- 128/60 130/62 130/62   Heart Rate (Admit) 79 bpm -- 73 bpm 82 bpm 82 bpm   Heart Rate (Exercise) 93 bpm -- 91 bpm 91 bpm 91 bpm   Heart Rate (Exit) 72 bpm -- 80 bpm 90 bpm 90 bpm   Oxygen Saturation (Admit) 100 % -- 94 % 93 % 93 %   Oxygen Saturation (Exercise) 93 % -- 95  % 93 % 93 %   Oxygen Saturation (Exit) 95 % -- 95 % 96 % 96 %   Rating of Perceived Exertion (Exercise) 15 -- 15 13 13    Perceived Dyspnea (Exercise) 0 -- 3 0 0   Symptoms none -- none none none   Duration Progress to 30 minutes of  aerobic without signs/symptoms of physical distress -- Progress to 30 minutes of  aerobic without signs/symptoms of physical distress Progress to 30 minutes of  aerobic without signs/symptoms of physical distress Progress to 30 minutes of  aerobic without signs/symptoms of physical distress   Intensity THRR unchanged -- THRR unchanged THRR unchanged THRR unchanged     Progression   Progression Continue to progress workloads to maintain intensity without signs/symptoms of physical distress. -- Continue to progress workloads to maintain intensity without signs/symptoms of physical distress. Continue to progress workloads to maintain intensity without signs/symptoms of physical distress. Continue to progress workloads to maintain intensity without signs/symptoms of physical distress.   Average METs 2.7 -- 2.34 2.74 2.74     Resistance Training   Training Prescription Yes -- Yes Yes Yes   Weight 5lb -- 6 lb 6 lb 6 lb   Reps 10-15 -- 10-15 10-15 10-15     Interval Training   Interval Training No -- No No No     Treadmill   MPH 1.5 -- 1.7 1.3 1.3   Grade 0 -- 0 0.5 0.5   Minutes 15 -- 15 15 15    METs 2.15 -- 2.3 2.08 2.08     Recumbant Bike   Level 1.5 -- 1 -- --   Watts 14 -- 14 -- --  Minutes 15 -- 15 -- --   METs 2.45 -- 2.45 -- --     NuStep   Level 4 -- 3 4 4    Minutes 15 -- 15 15 15    METs 3.6 -- 3.2 3.2 3.2     Biostep-RELP   Level 2 -- 3 -- --   Minutes 15 -- 15 -- --   METs -- -- 2 -- --     Track   Laps 17 -- 17 -- --   Minutes 15 -- 15 -- --   METs 1.92 -- 1.92 -- --     Home Exercise Plan   Plans to continue exercise at -- Home (comment)  Yeilin plans to try walking for 30 minutes twice a week, outside at home. She struggles walking  for 15 minutes in the program but states she can break up the home ex. to 15 minute intervals. Home (comment)  Dalena plans to try walking for 30 minutes twice a week, outside at home. She struggles walking for 15 minutes in the program but states she can break up the home ex. to 15 minute intervals. Home (comment)  Taylin plans to try walking for 30 minutes twice a week, outside at home. She struggles walking for 15 minutes in the program but states she can break up the home ex. to 15 minute intervals. Home (comment)  Marinell plans to try walking for 30 minutes twice a week, outside at home. She struggles walking for 15 minutes in the program but states she can break up the home ex. to 15 minute intervals.   Frequency -- Add 2 additional days to program exercise sessions. Add 2 additional days to program exercise sessions. Add 2 additional days to program exercise sessions. Add 2 additional days to program exercise sessions.   Initial Home Exercises Provided -- 04/20/23 04/20/23 04/20/23 04/20/23     Oxygen   Maintain Oxygen Saturation 88% or higher 88% or higher 88% or higher 88% or higher 88% or higher    Row Name 06/12/23 1700 06/28/23 1100 07/11/23 1500         Response to Exercise   Blood Pressure (Admit) 162/78 140/62 142/68     Blood Pressure (Exit) 122/64 152/62 130/60     Heart Rate (Admit) 86 bpm 70 bpm 73 bpm     Heart Rate (Exercise) 78 bpm 99 bpm 106 bpm     Heart Rate (Exit) 81 bpm 78 bpm 71 bpm     Oxygen Saturation (Admit) 92 % 96 % 97 %     Oxygen Saturation (Exercise) 93 % 88 % 91 %     Oxygen Saturation (Exit) 95 % 95 % 96 %     Rating of Perceived Exertion (Exercise) 13 13 15      Perceived Dyspnea (Exercise) 1 1 3      Symptoms none none none     Duration Continue with 30 min of aerobic exercise without signs/symptoms of physical distress. Continue with 30 min of aerobic exercise without signs/symptoms of physical distress. Continue with 30 min of aerobic exercise without  signs/symptoms of physical distress.     Intensity THRR unchanged THRR unchanged THRR unchanged       Progression   Progression Continue to progress workloads to maintain intensity without signs/symptoms of physical distress. Continue to progress workloads to maintain intensity without signs/symptoms of physical distress. Continue to progress workloads to maintain intensity without signs/symptoms of physical distress.     Average METs 3.18 2.8  2.11       Resistance Training   Training Prescription Yes Yes Yes     Weight 6 lb 6 lb 6 lb     Reps 10-15 10-15 10-15       Interval Training   Interval Training No No No       Treadmill   MPH -- 1.9 1.5     Grade -- 0.5 0.5     Minutes -- 15 15     METs -- 2.59 2.25       NuStep   Level 4 4 --     Minutes 30 30 --     METs 3.5 3.1 --       T5 Nustep   Level -- 3  T6 3     Minutes -- 15 15     METs -- 2.9 2       Biostep-RELP   Level -- -- 2     Minutes -- -- 15       Track   Laps -- -- 20     Minutes -- -- 15     METs -- -- 2.09       Home Exercise Plan   Plans to continue exercise at Home (comment)  Eduarda plans to try walking for 30 minutes twice a week, outside at home. She struggles walking for 15 minutes in the program but states she can break up the home ex. to 15 minute intervals. Home (comment)  Ashantia plans to try walking for 30 minutes twice a week, outside at home. She struggles walking for 15 minutes in the program but states she can break up the home ex. to 15 minute intervals. Home (comment)  Abriella plans to try walking for 30 minutes twice a week, outside at home. She struggles walking for 15 minutes in the program but states she can break up the home ex. to 15 minute intervals.     Frequency Add 2 additional days to program exercise sessions. Add 2 additional days to program exercise sessions. Add 2 additional days to program exercise sessions.     Initial Home Exercises Provided 04/20/23 04/20/23 04/20/23        Oxygen   Maintain Oxygen Saturation 88% or higher 88% or higher 88% or higher              Exercise Comments:   Exercise Comments     Row Name 02/16/23 1610           Exercise Comments First full day of exercise!  Patient was oriented to gym and equipment including functions, settings, policies, and procedures.  Patient's individual exercise prescription and treatment plan were reviewed.  All starting workloads were established based on the results of the 6 minute walk test done at initial orientation visit.  The plan for exercise progression was also introduced and progression will be customized based on patient's performance and goals.                Exercise Goals and Review:   Exercise Goals     Row Name 02/13/23 1644             Exercise Goals   Increase Physical Activity Yes       Intervention Provide advice, education, support and counseling about physical activity/exercise needs.;Develop an individualized exercise prescription for aerobic and resistive training based on initial evaluation findings, risk stratification, comorbidities and participant's personal goals.       Expected Outcomes Short  Term: Attend rehab on a regular basis to increase amount of physical activity.;Long Term: Exercising regularly at least 3-5 days a week.;Long Term: Add in home exercise to make exercise part of routine and to increase amount of physical activity.       Increase Strength and Stamina Yes       Intervention Provide advice, education, support and counseling about physical activity/exercise needs.;Develop an individualized exercise prescription for aerobic and resistive training based on initial evaluation findings, risk stratification, comorbidities and participant's personal goals.       Expected Outcomes Short Term: Increase workloads from initial exercise prescription for resistance, speed, and METs.;Short Term: Perform resistance training exercises routinely during rehab and  add in resistance training at home;Long Term: Improve cardiorespiratory fitness, muscular endurance and strength as measured by increased METs and functional capacity ( )       Able to understand and use rate of perceived exertion (RPE) scale Yes       Intervention Provide education and explanation on how to use RPE scale       Expected Outcomes Short Term: Able to use RPE daily in rehab to express subjective intensity level;Long Term:  Able to use RPE to guide intensity level when exercising independently       Able to understand and use Dyspnea scale Yes       Intervention Provide education and explanation on how to use Dyspnea scale       Expected Outcomes Short Term: Able to use Dyspnea scale daily in rehab to express subjective sense of shortness of breath during exertion;Long Term: Able to use Dyspnea scale to guide intensity level when exercising independently       Knowledge and understanding of Target Heart Rate Range (THRR) Yes       Intervention Provide education and explanation of THRR including how the numbers were predicted and where they are located for reference       Expected Outcomes Short Term: Able to state/look up THRR;Long Term: Able to use THRR to govern intensity when exercising independently;Short Term: Able to use daily as guideline for intensity in rehab       Able to check pulse independently Yes       Intervention Provide education and demonstration on how to check pulse in carotid and radial arteries.;Review the importance of being able to check your own pulse for safety during independent exercise       Expected Outcomes Short Term: Able to explain why pulse checking is important during independent exercise;Long Term: Able to check pulse independently and accurately       Understanding of Exercise Prescription Yes       Intervention Provide education, explanation, and written materials on patient's individual exercise prescription       Expected Outcomes Short Term:  Able to explain program exercise prescription;Long Term: Able to explain home exercise prescription to exercise independently                Exercise Goals Re-Evaluation :  Exercise Goals Re-Evaluation     Row Name 02/16/23 0811 02/22/23 1122 03/09/23 0855 03/22/23 1617 04/06/23 1552     Exercise Goal Re-Evaluation   Exercise Goals Review Able to understand and use rate of perceived exertion (RPE) scale;Able to understand and use Dyspnea scale;Knowledge and understanding of Target Heart Rate Range (THRR);Understanding of Exercise Prescription Increase Physical Activity;Increase Strength and Stamina;Understanding of Exercise Prescription Increase Physical Activity;Increase Strength and Stamina;Understanding of Exercise Prescription Increase Physical Activity;Increase Strength and Stamina;Understanding  of Exercise Prescription Increase Physical Activity;Increase Strength and Stamina;Understanding of Exercise Prescription   Comments Reviewed RPE and dyspnea scale, THR and program prescription with pt today.  Pt voiced understanding and was given a copy of goals to take home. Keyonna is off to a good start in the program. She has only attended one session during this review. She was able to increase her track laps from 8 to 20 in 15 minutes, and used the biostep at level 1. We will continue to monitor her progress in the program. Oluwadarasimi is doing well in rehab. She recently was able to increase her level on the T4 nustep from level 1 to 2. She has maintained her intensity on the biostep at level 1. We will continue to monitor her progress in the program. Heydi continues to do well in rehab. She was able to walk 17 laps in the hallway in 15 minutes. She also used the treadmill at a workload of 1.4 mph and 0% grade. We will continue to monitor her progress in the program. Libbie continues to do well in rehab. She has only attended one session since the time of this review. During this session she was able to  increase her level on the T4 nustep from level 2 to 3. She also was able to maintain an intensity of level 1 on the recumbent bike. We will continue to monitor her progress in the program.   Expected Outcomes Short: Use RPE daily to regulate intensity. Long: Follow program prescription in THR. Short: Continue to follow current exercise prescription, and progressively increase workloads. Long: Continue exercise to improve strength and stamina. Short: Continue to follow current exercise prescription, and progressively increase workloads. Long: Continue exercise to improve strength and stamina. Short: Continue to follow current exercise prescription, and progressively increase workloads. Long: Continue exercise to improve strength and stamina. Short: Continue to follow current exercise prescription, and progressively increase workloads. Long: Continue exercise to improve strength and stamina.    Row Name 04/17/23 1152 04/20/23 0749 05/02/23 0759 05/04/23 1427 05/18/23 1626     Exercise Goal Re-Evaluation   Exercise Goals Review Increase Physical Activity;Increase Strength and Stamina;Understanding of Exercise Prescription Understanding of Exercise Prescription;Able to understand and use Dyspnea scale;Increase Physical Activity;Knowledge and understanding of Target Heart Rate Range (THRR);Increase Strength and Stamina;Able to check pulse independently;Able to understand and use rate of perceived exertion (RPE) scale Increase Physical Activity;Able to check pulse independently;Knowledge and understanding of Target Heart Rate Range (THRR) Increase Physical Activity;Understanding of Exercise Prescription;Increase Strength and Stamina Increase Physical Activity;Understanding of Exercise Prescription;Increase Strength and Stamina   Comments Myalynn continues to do well in rehab. She has been able to increase her level on the T4 nustep from level 3 to level 4. She was also able to increase her level on the Biostep from  level 1 to level 2. We will continue to monitor her progress in the program. Reviewed home exercise with pt today from 7:35 to 7:45.  Pt plans to walk outside at home, for a target of 30 minutes, but will break the time up into 15 min intervals. Reviewed THR, pulse, RPE, sign and symptoms, pulse oximetery and when to call 911 or MD.  Also discussed weather considerations and indoor options.  Pt voiced understanding. Brynnley is doing well in the program and understands what her home exercise prescription is. She states that this month has been a little too cold and just generally has too much going on to start her at home  exercise. We will continue to check in on her to monitor her progress. Maricella continues to do well in rehab. She increased her treadmill workload to 1.7 mph with no incline, and increased to 17 laps walked on the track. She also improved to level 3 on the biostep and increased to 6 lb hand weights for resistance training. We will continue to monitor her progress in the program. Novalee has only attended two sessions of rehab since the last review. She decreased her treadmill workload to a speed of 1.3 mph with a 0.5% incline. She also improved back up to level 4 on the T4 nustep. We will continue to monitor her progress in the program.   Expected Outcomes Short: Continue to follow current exercise prescription, and progressively increase workloads. Long: Continue exercise to improve strength and stamina. Short: Try to implement home exercise. Long: Continue exercise to improve strength and stamina. Short: Try to implement home exercise. Long: Continue exercise to improve strength and stamina. Short: Continue to progressively increase treadmill workload and push for more laps on the track. Long: Continue exercise to improve strength and stamina. Short: Increase treadmill workload back up to previous level. Long: Continue exercise to improve strength and stamina.    Row Name 05/29/23 1614 05/30/23 0809  06/08/23 0841 06/12/23 1712 06/28/23 1136     Exercise Goal Re-Evaluation   Exercise Goals Review Increase Physical Activity;Understanding of Exercise Prescription;Increase Strength and Stamina Increase Physical Activity;Increase Strength and Stamina Increase Physical Activity;Increase Strength and Stamina;Understanding of Exercise Prescription Increase Physical Activity;Increase Strength and Stamina;Understanding of Exercise Prescription Increase Physical Activity;Increase Strength and Stamina;Understanding of Exercise Prescription   Comments Shantee has not attended the program since the last review. We will be in touch with her to determine why she is out and determine when she will resume. Kayzlee returned to the program today and we checked in on her home exercise. She has been trying to add home exercise back in after the holidays and her recent visit to the ED. She has inherited an exercise bike and has begun using that for some aerobic exericse at home as it is similar exercise that she does in rehab here. As she gets back into a routine she hopes to add more home exercise. Havanah walks at home for part of her home exercise. She has been unable to walk this week due to the weather because she also could not get her car out to go walk at the mall. She has also been dizzy recently which has impacted her being consistent with exercising. Shes hoping to get back to it next week. Khalilah is doing well in rehab. She has maintained level 4 on the T4 nustep. She only used the T4 nustep in this review and we will encourage her to use different modalities for aerobic exercise. We will continue to monitor her progress in the program. Terrance continues to do well in rehab. She was able to increase her speed on the Treadmill from 1. to 1.34mph. She was also able to use the T6 nustep for the first time at level 3. We will coontinue to monitor her progress in the program.   Expected Outcomes Short: Return to rehab program.  Long: Attend rehab program consistently for exercise benefits. Short: Add home exercise with her new exercise bike into her routine consistently. Long: Continue to exercise at home independently to improve strength and stamina. Short: Add walking back into her home exercise consistently. Long: Continue to exercise at home independently to  improve strength and stamina. Short: Add in another exercise modality for aerobic exercise besides the T4 nustep.  Long: Continue exercise to improve strength and stamina. Short: Continue to use and improve workloads on the treadmill. Long: Continue exercise to improve strength and stamina.    Row Name 07/11/23 1533 07/24/23 1647           Exercise Goal Re-Evaluation   Exercise Goals Review Increase Physical Activity;Increase Strength and Stamina;Understanding of Exercise Prescription Increase Physical Activity      Comments Ptasy is doing well in rehab. She was able to maintain level 3 on the T5 nustep, as well as level 2 on the biostep. She was also able to increase her laps on the track from 17 to 20. We will continue to monitor her progress in the program. Richanda is walking at least 2 days a week at home.  She has walked around [employer] without any shortness of breath as she goes slow and easy on flat surface.  She stateds that she had not been able to [place] to [place] until recently.      Expected Outcomes Short: Improve on . Long: Continue exercise to improve strength and stamina. STG continue her walking at home and attending scheduled sessions, work on exercise progression  LTG increased stamina and energy to  continue with exercise progression               Discharge Exercise Prescription (Final Exercise Prescription Changes):  Exercise Prescription Changes - 07/11/23 1500       Response to Exercise   Blood Pressure (Admit) 142/68    Blood Pressure (Exit) 130/60    Heart Rate (Admit) 73 bpm    Heart Rate (Exercise) 106 bpm    Heart Rate  (Exit) 71 bpm    Oxygen Saturation (Admit) 97 %    Oxygen Saturation (Exercise) 91 %    Oxygen Saturation (Exit) 96 %    Rating of Perceived Exertion (Exercise) 15    Perceived Dyspnea (Exercise) 3    Symptoms none    Duration Continue with 30 min of aerobic exercise without signs/symptoms of physical distress.    Intensity THRR unchanged      Progression   Progression Continue to progress workloads to maintain intensity without signs/symptoms of physical distress.    Average METs 2.11      Resistance Training   Training Prescription Yes    Weight 6 lb    Reps 10-15      Interval Training   Interval Training No      Treadmill   MPH 1.5    Grade 0.5    Minutes 15    METs 2.25      T5 Nustep   Level 3    Minutes 15    METs 2      Biostep-RELP   Level 2    Minutes 15      Track   Laps 20    Minutes 15    METs 2.09      Home Exercise Plan   Plans to continue exercise at Home (comment)   Eliannah plans to try walking for 30 minutes twice a week, outside at home. She struggles walking for 15 minutes in the program but states she can break up the home ex. to 15 minute intervals.   Frequency Add 2 additional days to program exercise sessions.    Initial Home Exercises Provided 04/20/23      Oxygen   Maintain  Oxygen Saturation 88% or higher             Nutrition:  Target Goals: Understanding of nutrition guidelines, daily intake of sodium 1500mg , cholesterol 200mg , calories 30% from fat and 7% or less from saturated fats, daily to have 5 or more servings of fruits and vegetables.  Education: All About Nutrition: -Group instruction provided by verbal, written material, interactive activities, discussions, models, and posters to present general guidelines for heart healthy nutrition including fat, fiber, MyPlate, the role of sodium in heart healthy nutrition, utilization of the nutrition label, and utilization of this knowledge for meal planning. Follow up email sent  as well. Written material given at graduation.   Biometrics:  Pre Biometrics - 02/13/23 1644       Pre Biometrics   Height 5' 6.2" (1.681 m)    Weight 216 lb 1.6 oz (98 kg)    Waist Circumference 45.5 inches    Hip Circumference 50 inches    Waist to Hip Ratio 0.91 %    BMI (Calculated) 34.69    Single Leg Stand 1.5 seconds              Nutrition Therapy Plan and Nutrition Goals:  Nutrition Therapy & Goals - 02/16/23 0947       Nutrition Therapy   Diet Cardiac, low na    Protein (specify units) 90    Fiber 25 grams    Whole Grain Foods 3 servings    Saturated Fats 15 max. grams    Fruits and Vegetables 5 servings/day    Sodium 2 grams      Personal Nutrition Goals   Nutrition Goal Look into healthy snacks to help be consistent in not missing meals    Personal Goal #2 Eat a protein at every meals    Comments Patient drinking 24oz of water daily, has been drinking this much to avoid fluid on her chest, making it hard to breath. Spoke to her about watching her sodium intake as well. She reads labels and is knowledgeable of basics on nutrition. Reviewed Mediterranean diet handout, educated on types of fats, sources, and how to read them on label. Encouraged more veggies at larger meals or when eating poor food choices like fried chicken. She reports that sometimes she gets busy and misses meals. Recommended several smaller meals and snacks with quick grab and go friendly foods to try and be more consistent. Overall, she is doing well and is knowledgeable of how to build balanced plates      Intervention Plan   Intervention Prescribe, educate and counsel regarding individualized specific dietary modifications aiming towards targeted core components such as weight, hypertension, lipid management, diabetes, heart failure and other comorbidities.;Nutrition handout(s) given to patient.    Expected Outcomes Short Term Goal: Understand basic principles of dietary content, such as  calories, fat, sodium, cholesterol and nutrients.;Short Term Goal: A plan has been developed with personal nutrition goals set during dietitian appointment.;Long Term Goal: Adherence to prescribed nutrition plan.             Nutrition Assessments:  MEDIFICTS Score Key: >=70 Need to make dietary changes  40-70 Heart Healthy Diet <= 40 Therapeutic Level Cholesterol Diet  Flowsheet Row Pulmonary Rehab from 02/13/2023 in [facility]  Picture Your Plate Total Score on Admission 57      Picture Your Plate Scores: <16 Unhealthy dietary pattern with much room for improvement. 41-50 Dietary pattern unlikely to meet recommendations for good health and  room for improvement. 51-60 More healthful dietary pattern, with some room for improvement.  >60 Healthy dietary pattern, although there may be some specific behaviors that could be improved.   Nutrition Goals Re-Evaluation:  Nutrition Goals Re-Evaluation     Row Name 03/02/23 0804 04/04/23 0900 05/02/23 0806 05/30/23 0825 06/08/23 0849     Goals   Current Weight 218 lb (98.9 kg) -- 217 lb (98.4 kg) 219 lb 4.8 oz (99.5 kg) 219 lb 9.6 oz (99.6 kg)   Nutrition Goal -- -- Look into healthy snacks to help be consistent in not missing meals Look into healthy snacks to help be consistent in not missing meals Look into healthy snacks to help be consistent in not missing meals   Comment Patient was informed on why it is important to maintain a balanced diet when dealing with Respiratory issues. Explained that it takes a lot of energy to breath and when they are short of breath often they will need to have a good diet to help keep up with the calories they are expending for breathing. Patient is attempting nutrition goals from RD of eating a protein in every meal and choosing healthy snacks. She is not consistent with it. Patient is attempting nutrition goals from RD of eating a protein in every meal and choosing healthy snacks. She  is not consistent with it. Aniza is attempting her nutrition goals from RD, but is not consistent with it due to the holidays and funeral events for her brother in law. She has had someone in her house since 12/10, making it hard for her to work on her nutrition goals as there was more unhealthy foods around. She is working on adding more protein at every meal, but is having trouble with healthy snacks as she needs to find time to plan her meals. She also wants to cut down on her wine consumption and we discussed substiuting wine with another drink such as sparkling water when she wants a drink as a way to cut down. Marleena has been doing better with her nutrition goals as she has not been out with the cold and snowy weather to get junk food, and has been eating her healthy snacks and protein she bought. She states she has been eating more food/portion size due to the cold weather.   Expected Outcome Short: Choose and plan snacks accordingly to patients caloric intake to improve breathing. Long: Maintain a diet independently that meets their caloric intake to aid in daily shortness of breath. Short: Continue to choose and plan healthy snacks andhave a protein every meal. Long: mantain a diet independently that meets caloric intake and includes these healthy snacks and protein. Short: Continue to choose and plan healthy snacks andhave a protein every meal. Long: mantain a diet independently that meets caloric intake and includes these healthy snacks and protein. Short: Plan healthy snacks and have a protein every meal as family has left from the holidays. Long: mantain a diet independently that meets caloric intake and includes these healthy snacks and protein. Short: Continue to get healthy snacks and have a protein every meal and reduce unhealthy choices. Long: mantain a diet independently that meets caloric intake and includes these healthy snacks and protein.     Personal Goal #2 Re-Evaluation   Personal Goal  #2 -- -- -- Eat a protein at every meals Eat a protein at every meals    Row Name 07/24/23 1625  Goals   Nutrition Goal Look into healthy snacks to help be consistent in not missing meals       Comment Ariany is improvig her eating habits. Working on portion sizes,decreasing as she can, eating healthier snack, has decreased wine intake from every day to only on weekends.  The wine intake makes her [place] fro snacks, by decresaing her wine intake,she has decreased her snack eating. She is trting to not eat snacks at all.She is adding a protein to her Jeronimo Norma is trying to watch her sodium intake.       Expected Outcome STG continue to work on adding protein to her meals, keep decreasing the snack intake and watch her sodium intake. LTG Shalisa has a nutrition plan in action that is healthy for her and she is able to [facility] the plan         Personal Goal #2 Re-Evaluation   Personal Goal #2 Eat a protein at every meals                Nutrition Goals Discharge (Final Nutrition Goals Re-Evaluation):  Nutrition Goals Re-Evaluation - 07/24/23 1625       Goals   Nutrition Goal Look into healthy snacks to help be consistent in not missing meals    Comment Zynasia is improvig her eating habits. Working on portion sizes,decreasing as she can, eating healthier snack, has decreased wine intake from every day to only on weekends.  The wine intake makes her [place] fro snacks, by decresaing her wine intake,she has decreased her snack eating. She is trting to not eat snacks at all.She is adding a protein to her Jeronimo Norma is trying to watch her sodium intake.    Expected Outcome STG continue to work on adding protein to her meals, keep decreasing the snack intake and watch her sodium intake. LTG Laveah has a nutrition plan in action that is healthy for her and she is able to [place] [facility]      Personal Goal #2 Re-Evaluation   Personal Goal #2 Eat a protein at every meals              Psychosocial: Target Goals: Acknowledge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

## 2023-07-27 ENCOUNTER — Other Ambulatory Visit (HOSPITAL_COMMUNITY): Payer: Self-pay | Admitting: Pharmacist

## 2023-07-27 ENCOUNTER — Encounter (HOSPITAL_COMMUNITY): Payer: Self-pay

## 2023-07-27 ENCOUNTER — Telehealth (HOSPITAL_COMMUNITY): Payer: Self-pay

## 2023-07-27 ENCOUNTER — Encounter: Payer: Self-pay | Admitting: Cardiology

## 2023-07-27 ENCOUNTER — Ambulatory Visit: Payer: Medicare Other

## 2023-07-27 ENCOUNTER — Encounter: Payer: Medicare Other | Admitting: *Deleted

## 2023-07-27 ENCOUNTER — Other Ambulatory Visit (HOSPITAL_COMMUNITY): Payer: Self-pay

## 2023-07-27 DIAGNOSIS — I5032 Chronic diastolic (congestive) heart failure: Secondary | ICD-10-CM

## 2023-07-27 MED ORDER — VYNDAMAX 61 MG PO CAPS
61.0000 mg | ORAL_CAPSULE | Freq: Every day | ORAL | 11 refills | Status: DC
  Filled 2023-07-28: qty 30, 30d supply, fill #0
  Filled 2023-08-16: qty 30, 30d supply, fill #1
  Filled 2023-09-19: qty 30, 30d supply, fill #2
  Filled 2023-10-23 – 2023-10-25 (×3): qty 30, 30d supply, fill #3
  Filled 2023-11-21: qty 30, 30d supply, fill #4
  Filled 2023-12-15: qty 30, 30d supply, fill #5
  Filled 2024-01-10 – 2024-01-18 (×2): qty 30, 30d supply, fill #6
  Filled 2024-02-14 – 2024-02-16 (×3): qty 30, 30d supply, fill #7
  Filled 2024-03-13 – 2024-03-18 (×3): qty 30, 30d supply, fill #8
  Filled 2024-04-23: qty 30, 30d supply, fill #9
  Filled 2024-05-17 – 2024-05-24 (×2): qty 30, 30d supply, fill #10

## 2023-07-27 NOTE — Telephone Encounter (Signed)
 Advanced Heart Failure Patient Advocate Encounter  The patient was approved for a [employer] grant that will help cover the cost of Iva, Vyndamax.  Total amount awarded, $10,000.  Effective: 06/27/2023 - 06/25/2024.  BIN [id number] PCN [id number] Group [id number] ID [id number]  Pharmacy provided with approval and processing information. Patient informed via phone.  Burnell Blanks, CPhT Rx Patient Advocate Phone: [phone]

## 2023-07-27 NOTE — Progress Notes (Signed)
 Daily Session Note  Patient Details  Name: GRACIELLA ARMENT MRN: [id number] Date of Birth: 1941/05/15 Referring Provider:   Flowsheet Row Pulmonary Rehab from 02/13/2023 in [facility] Cardiac and Pulmonary Rehab  Referring Provider Julien Nordmann, MD       Encounter Date: 07/27/2023  Check In:  Session Check In - 07/27/23 1539       Check-In   Supervising physician immediately available to respond to emergencies See telemetry face sheet for immediately available ER MD    Location [facility]-Cardiac & Pulmonary Rehab    Staff Present Susann Givens RN,BSN;Joseph Hollace Kinnier;Cora Collum, RN, BSN, CCRP;Maxon Conetta BS, Exercise Physiologist    Virtual Visit No    Medication changes reported     No    Fall or balance concerns reported    No    Warm-up and Cool-down Performed on first and last piece of equipment    Resistance Training Performed Yes    VAD Patient? No    PAD/SET Patient? No      Pain Assessment   Currently in Pain? No/denies                Social History   Tobacco Use  Smoking Status Former   Current packs/day: 0.00   Average packs/day: 1 pack/day for 30.0 years (30.0 ttl pk-yrs)   Types: Cigarettes   Start date: 05/17/1959   Quit date: 05/16/1989   Years since quitting: 34.2   Passive exposure: Past  Smokeless Tobacco Never    Goals Met:  Independence with exercise equipment Exercise tolerated well No report of concerns or symptoms today Strength training completed today  Goals Unmet:  Not Applicable  Comments: Pt able to follow exercise prescription today without complaint.  Will continue to monitor for progression.    Dr. Bethann Punches is Medical Director for [facility] Cardiac Rehabilitation.  Dr. Vida Rigger is Medical Director for [facility] Pulmonary Rehabilitation.

## 2023-07-28 ENCOUNTER — Other Ambulatory Visit: Payer: Self-pay

## 2023-07-28 ENCOUNTER — Other Ambulatory Visit (HOSPITAL_COMMUNITY): Payer: Self-pay

## 2023-07-28 ENCOUNTER — Encounter (HOSPITAL_COMMUNITY): Payer: Self-pay

## 2023-07-28 ENCOUNTER — Other Ambulatory Visit: Payer: Self-pay | Admitting: Pharmacist

## 2023-07-28 NOTE — Progress Notes (Signed)
 Specialty Pharmacy Initial Fill Coordination Note  Lauren Lloyd is a 83 y.o. female contacted today regarding initial fill of specialty medication(s) Tafamidis [name])   Patient requested Delivery   Delivery date: 07/31/23   Verified address: [address] [place] [address]   Medication will be filled on 07/31/2023.   Patient is aware of $0 copayment.

## 2023-07-28 NOTE — Progress Notes (Signed)
   07/28/2023  Patient ID: Lauren Lloyd, female   DOB: [birth year], 83 y.o.   MRN: [id number]  Called and spoke with the patient on the phone regarding second referral for Eliquis from PCP.   She reports getting it from PAP previously. Advised to be enrolled, she would have to spend the 3% OOP Rx expense based on income first to qualify. Xarelto PAP has a 4% OOP Rx expense. No more Sales promotion account executive option either.We have to spend about $900 to qualify, which patient reports is about 2 months of income for her.   Told patient she has a $375 drug deductible, so first fill will be about $422. Every fill after that would be $45 monthly.   Plans to discuss with Cardiology regarding concerns.     Ricka Burdock, PharmD [facility] Phone Number: [phone]

## 2023-07-28 NOTE — Progress Notes (Signed)
 Specialty Pharmacy Initiation Note   Lauren Lloyd is a 83 y.o. female who will be followed by the specialty pharmacy service for RxSp Cardiology    Review of administration, indication, effectiveness, safety, potential side effects, storage/disposable, and missed dose instructions occurred today for patient's specialty medication(s) Tafamidis [facility])     Patient/Caregiver did not have any additional questions or concerns.   Patient's therapy is appropriate to: Initiate    Goals Addressed             This Visit's Progress    Slow Disease Progression       Patient is initiating therapy. Patient will maintain adherence         Eliya Geiman [place] Specialty Pharmacist

## 2023-07-31 ENCOUNTER — Encounter: Payer: Medicare Other | Admitting: *Deleted

## 2023-07-31 DIAGNOSIS — I5032 Chronic diastolic (congestive) heart failure: Secondary | ICD-10-CM | POA: Diagnosis not present

## 2023-07-31 NOTE — Progress Notes (Signed)
 Daily Session Note  Patient Details  Name: SCOTTLYN MCHANEY MRN: [id number] Date of Birth: Jun 14, 1940 Referring Provider:   Flowsheet Row Pulmonary Rehab from 02/13/2023 in [facility] Cardiac and Pulmonary Rehab  Referring Provider Julien Nordmann, MD       Encounter Date: 07/31/2023  Check In:  Session Check In - 07/31/23 1551       Check-In   Supervising physician immediately available to respond to emergencies See telemetry face sheet for immediately available ER MD    Location [facility]-Cardiac & Pulmonary Rehab    Staff Present Susann Givens RN,BSN;Joseph Casimer Lanius, [place], RRT, CPFT    Virtual Visit No    Medication changes reported     No    Fall or balance concerns reported    No    Warm-up and Cool-down Performed on first and last piece of equipment    Resistance Training Performed Yes    VAD Patient? No    PAD/SET Patient? No      Pain Assessment   Currently in Pain? No/denies                Social History   Tobacco Use  Smoking Status Former   Current packs/day: 0.00   Average packs/day: 1 pack/day for 30.0 years (30.0 ttl pk-yrs)   Types: Cigarettes   Start date: 05/17/1959   Quit date: 05/16/1989   Years since quitting: 34.2   Passive exposure: Past  Smokeless Tobacco Never    Goals Met:  Independence with exercise equipment Exercise tolerated well No report of concerns or symptoms today Strength training completed today  Goals Unmet:  Not Applicable  Comments: Pt able to follow exercise prescription today without complaint.  Will continue to monitor for progression.    Dr. Bethann Punches is Medical Director for [facility] Cardiac Rehabilitation.  Dr. Vida Rigger is Medical Director for [facility] Pulmonary Rehabilitation.

## 2023-08-01 ENCOUNTER — Ambulatory Visit: Payer: Medicare Other

## 2023-08-03 ENCOUNTER — Encounter: Payer: Medicare Other | Admitting: *Deleted

## 2023-08-03 ENCOUNTER — Encounter: Payer: Self-pay | Admitting: Oncology

## 2023-08-03 ENCOUNTER — Inpatient Hospital Stay

## 2023-08-03 ENCOUNTER — Inpatient Hospital Stay: Attending: Oncology | Admitting: Oncology

## 2023-08-03 ENCOUNTER — Ambulatory Visit: Payer: Medicare Other

## 2023-08-03 VITALS — BP 139/66 | HR 69 | Temp 98.6°F | Resp 18 | Ht 67.0 in | Wt 215.8 lb

## 2023-08-03 VITALS — Ht 67.0 in | Wt 215.0 lb

## 2023-08-03 DIAGNOSIS — I110 Hypertensive heart disease with heart failure: Secondary | ICD-10-CM | POA: Diagnosis not present

## 2023-08-03 DIAGNOSIS — I2510 Atherosclerotic heart disease of native coronary artery without angina pectoris: Secondary | ICD-10-CM | POA: Diagnosis not present

## 2023-08-03 DIAGNOSIS — D472 Monoclonal gammopathy: Secondary | ICD-10-CM | POA: Insufficient documentation

## 2023-08-03 DIAGNOSIS — I5032 Chronic diastolic (congestive) heart failure: Secondary | ICD-10-CM | POA: Insufficient documentation

## 2023-08-03 LAB — BASIC METABOLIC PANEL - CANCER CENTER ONLY
Anion gap: 10 (ref 5–15)
BUN: 24 mg/dL — ABNORMAL HIGH (ref 8–23)
CO2: 23 mmol/L (ref 22–32)
Calcium: 9 mg/dL (ref 8.9–10.3)
Chloride: 105 mmol/L (ref 98–111)
Creatinine: 1.02 mg/dL — ABNORMAL HIGH (ref 0.44–1.00)
GFR, Estimated: 55 mL/min — ABNORMAL LOW (ref >60)
Glucose, Bld: 108 mg/dL — ABNORMAL HIGH (ref 70–99)
Potassium: 3.8 mmol/L (ref 3.5–5.1)
Sodium: 138 mmol/L (ref 135–145)

## 2023-08-03 LAB — CBC (CANCER CENTER ONLY)
HCT: 42.4 % (ref 36.0–46.0)
Hemoglobin: 13.7 g/dL (ref 12.0–15.0)
MCH: 30.5 pg (ref 26.0–34.0)
MCHC: 32.3 g/dL (ref 30.0–36.0)
MCV: 94.4 fL (ref 80.0–100.0)
Platelet Count: 222 10*3/uL (ref 150–400)
RBC: 4.49 MIL/uL (ref 3.87–5.11)
RDW: 12.5 % (ref 11.5–15.5)
WBC Count: 7.9 10*3/uL (ref 4.0–10.5)
nRBC: 0 % (ref 0.0–0.2)

## 2023-08-03 NOTE — Progress Notes (Signed)
 Daily Session Note  Patient Details  Name: Lauren Lloyd MRN: [id number] Date of Birth: [birth year] Referring Provider:   Flowsheet Row Pulmonary Rehab from 02/13/2023 in [facility] Cardiac and Pulmonary Rehab  Referring Provider Julien Nordmann, MD       Encounter Date: 08/03/2023  Check In:  Session Check In - 08/03/23 1538       Check-In   Supervising physician immediately available to respond to emergencies See telemetry face sheet for immediately available ER MD    Location [facility]-Cardiac & Pulmonary Rehab    Staff Present Cora Collum, RN, BSN, CCRP;Jacqueline Delapena Jewel Baize RN,BSN;Joseph Hood RCP,RRT,BSRT;Maxon Foxworth BS, Exercise Physiologist    Virtual Visit No    Medication changes reported     Yes    Comments Tafamidis (VYNDAMAX) 61 MG CAPS    Fall or balance concerns reported    No    Warm-up and Cool-down Performed on first and last piece of equipment    Resistance Training Performed Yes    VAD Patient? No    PAD/SET Patient? No      Pain Assessment   Currently in Pain? No/denies                Social History   Tobacco Use  Smoking Status Former   Current packs/day: 0.00   Average packs/day: 1 pack/day for 30.0 years (30.0 ttl pk-yrs)   Types: Cigarettes   Start date: 05/17/1959   Quit date: 05/16/1989   Years since quitting: 34.2   Passive exposure: Past  Smokeless Tobacco Never    Goals Met:  Independence with exercise equipment Exercise tolerated well No report of concerns or symptoms today Strength training completed today  Goals Unmet:  Not Applicable  Comments:   6 Minute Walk     Row Name 02/13/23 1638 08/03/23 1558       6 Minute Walk   Phase Initial Discharge    Distance 945 feet 1235 feet    Distance % Change -- 30 %    Distance Feet Change -- 290 ft    Walk Time 6 minutes 6 minutes    # of Rest Breaks 0 0    MPH 1.79 2.34    METS 1.54 2.3    RPE 15 13    Perceived Dyspnea  3 2    VO2 Peak 5.41 8.04    Symptoms Yes (comment) No     Comments back pain --    Resting HR 70 bpm 70 bpm    Resting BP 130/74 128/68    Resting Oxygen Saturation  92 % 97 %    Exercise Oxygen Saturation  during 6 min walk 91 % 94 %    Max Ex. HR 111 bpm 133 bpm    Max Ex. BP 166/72 162/72    2 Minute Post BP 150/74 142/70      Interval HR   1 Minute HR 103 85    2 Minute HR 107 107    3 Minute HR 109 112    4 Minute HR 111 126    5 Minute HR 82 128    6 Minute HR 83 133    2 Minute Post HR 70 72    Interval Heart Rate? Yes Yes      Interval Oxygen   Interval Oxygen? Yes Yes    Baseline Oxygen Saturation % 92 % 97 %    1 Minute Oxygen Saturation % 91 % 97 %    1 Minute Liters  of Oxygen 0 L 0 L    2 Minute Oxygen Saturation % 92 % 96 %    2 Minute Liters of Oxygen 0 L 0 L    3 Minute Oxygen Saturation % 91 % 95 %    3 Minute Liters of Oxygen 0 L 0 L    4 Minute Oxygen Saturation % 91 % 94 %    4 Minute Liters of Oxygen 0 L 0 L    5 Minute Oxygen Saturation % 95 % 96 %    5 Minute Liters of Oxygen 0 L 0 L    6 Minute Oxygen Saturation % 93 % 96 %    6 Minute Liters of Oxygen 0 L 0 L    2 Minute Post Oxygen Saturation % 96 % 98 %    2 Minute Post Liters of Oxygen 0 L 0 L            Pt able to follow exercise prescription today without complaint.  Will continue to monitor for progression.    Dr. Bethann Punches is Medical Director for [facility] Cardiac Rehabilitation.  Dr. Vida Rigger is Medical Director for [facility] Pulmonary Rehabilitation.

## 2023-08-03 NOTE — Progress Notes (Signed)
 [facility]  [phone] [phone]  ID: ANIQUA BRIERE OB: 30-Oct-1940  MR#: [id number]  UYQ#:[id number]  Patient Care Team: Erasmo Downer, MD as PCP - General (Family Medicine) Mariah Milling Tollie Pizza, MD as PCP - Cardiology (Cardiology) Lanier Prude, MD as PCP - Electrophysiology (Cardiology) Merri Ray, MD as Referring Physician (Physical Medicine and Rehabilitation) [place], [facility] Fillers, Luis Abed, MD as Consulting Physician (Pulmonary Disease) Dasher, Cliffton Asters, MD (Dermatology) Thomasene Lot, DO as Referring Physician (Family Medicine)  CHIEF COMPLAINT: MGUS.  INTERVAL HISTORY: Patient is an 83 year old female who is noted to have a mildly elevated M spike on routine blood work.  She is referred for further evaluation.  She currently feels well and is asymptomatic.  She has no neurologic complaints.  She denies any recent fevers or illnesses.  She has a good appetite and denies weight loss.  She has no chest pain, shortness of breath, cough, or hemoptysis.  She denies any nausea, vomiting, constipation, or diarrhea.  She has no urinary complaints.  Patient feels at her baseline and offers no specific complaints today.  REVIEW OF SYSTEMS:   Review of Systems  Constitutional: Negative.  Negative for fever, malaise/fatigue and weight loss.  Respiratory: Negative.  Negative for cough, hemoptysis and shortness of breath.   Cardiovascular: Negative.  Negative for chest pain and leg swelling.  Gastrointestinal: Negative.  Negative for abdominal pain.  Genitourinary: Negative.  Negative for dysuria.  Musculoskeletal: Negative.  Negative for back pain.  Skin: Negative.  Negative for rash.  Neurological: Negative.  Negative for dizziness, focal weakness, weakness and headaches.  Psychiatric/Behavioral: Negative.  The patient is not nervous/anxious.     As per HPI. Otherwise, a complete review of systems is negative.  PAST  MEDICAL HISTORY: Past Medical History:  Diagnosis Date   (HFpEF) heart failure with preserved ejection fraction (HCC)    a. 05/2018 Echo: EF 55-60%, no rwma, mild to mod MR. Nl RV fxn. Mod TR. PASP .   Arthritis    knees, Hands   Arthritis of knee    Back pain    Carotid arterial disease (HCC)    a. 03/2019 Carotid U/S: <50% bilat ICA stenoses.   CHF (congestive heart failure) (HCC)    Cholelithiasis    a. 10/2018 noted on CT.   Edema, lower extremity    Fatty liver    GERD (gastroesophageal reflux disease)    History of stress test    a. 06/2018 MV: EF 59%, no ischemia/infarct. Low risk.   Hypertension    Knee pain    Lactose intolerance    Mitral regurgitation    a. 05/2018 Echo: mild to mod MR.   Multinodular goiter    Obesity    OSA (obstructive sleep apnea)    PAF (paroxysmal atrial fibrillation) (HCC)    a.  Diagnosed 12/19; b. 05/2018 s/p DCCV; c. 03/2019 & 05/2019 recurrent AFib-->managed w/ amio load; d. CHADS2VASc = 6 (CHF, HTN, age x 2, vascular disease, female)-->Eliquis & amio 100 qd.   PAH (pulmonary artery hypertension) (HCC)    RSV (acute bronchiolitis due to respiratory syncytial virus) 05/10/2022   Scoliosis    SOB (shortness of breath)    Swallowing difficulty     PAST SURGICAL HISTORY: Past Surgical History:  Procedure Laterality Date   AV NODE ABLATION N/A 07/05/2022   Procedure: AV NODE ABLATION;  Surgeon: Lanier Prude, MD;  [facility]: [facility] INVASIVE CV LAB;  Service: Cardiovascular;  Laterality: N/A;   CARDIOVERSION N/A 06/15/2018   Procedure: CARDIOVERSION (CATH LAB);  Surgeon: Antonieta Iba, MD;  Location: [facility] ORS;  Service: Cardiovascular;  Laterality: N/A;   CARDIOVERSION N/A 02/18/2021   Procedure: CARDIOVERSION;  Surgeon: Antonieta Iba, MD;  Location: [facility] ORS;  Service: Cardiovascular;  Laterality: N/A;   CATARACT EXTRACTION W/PHACO Right 01/25/2016   Procedure: CATARACT EXTRACTION PHACO AND INTRAOCULAR LENS PLACEMENT (IOC);   Surgeon: Sherald Hess, MD;  Location: [facility]ERY [facility];  Service: Ophthalmology;  Laterality: Right;  RIGHT   CATARACT EXTRACTION W/PHACO Left 02/22/2016   Procedure: CATARACT EXTRACTION PHACO AND INTRAOCULAR LENS PLACEMENT (IOC);  Surgeon: Sherald Hess, MD;  Location: [facility] SURGERY [facility];  Service: Ophthalmology;  Laterality: Left;  LEFT   HAMMER TOE SURGERY  05/16/2008   KNEE ARTHROSCOPY Right 05/16/2002   PACEMAKER IMPLANT N/A 10/25/2021   Procedure: PACEMAKER IMPLANT;  Surgeon: Lanier Prude, MD;  Location: [facility] INVASIVE CV LAB;  Service: Cardiovascular;  Laterality: N/A;   PARTIAL HIP ARTHROPLASTY Left 12/17/2021   REPLACEMENT TOTAL KNEE Right 05/17/2007   [facility]   SKIN GRAFT Left 04/08/2013   Done on left index finger   TONSILLECTOMY  05/16/1944   TOTAL KNEE ARTHROPLASTY Left 12/02/2020    FAMILY HISTORY: Family History  Problem Relation Age of Onset   Hyperlipidemia Sister    Atrial fibrillation Sister    Transient ischemic attack Mother    Heart disease Mother    Heart attack Father    High blood pressure Father    Stroke Father    Healthy Brother    Breast cancer Sister 56   Hyperlipidemia Sister    Atrial fibrillation Sister     ADVANCED DIRECTIVES (Y/N):  N  HEALTH MAINTENANCE: Social History   Tobacco Use   Smoking status: Former    Current packs/day: 0.00    Average packs/day: 1 pack/day for 30.0 years (30.0 ttl pk-yrs)    Types: Cigarettes    Start date: 05/17/1959    Quit date: 05/16/1989    Years since quitting: 34.2    Passive exposure: Past   Smokeless tobacco: Never  Vaping Use   Vaping status: Never Used  Substance Use Topics   Alcohol use: Yes    Alcohol/week: 14.0 standard drinks of alcohol    Types: 14 Glasses of wine per week   Drug use: No     Colonoscopy:  PAP:  Bone density:  Lipid panel:  Allergies  Allergen Reactions   Levofloxacin Other (See Comments)    Other reaction(s):  Joint Pains   Influenza Vaccine Recombinant Other (See Comments)   Influenza Vaccines Other (See Comments)    Bell's Palsy   Oysters [Shellfish Allergy] Swelling    She states she had eaten them three days in a row and she developed swelling around her eyes.     Current Outpatient Medications  Medication Sig Dispense Refill   allopurinol (ZYLOPRIM) 100 MG tablet TAKE ONE TABLET BY MOUTH EVERY DAY 90 tablet 1   calcium carbonate (TUMS EX) 750 MG chewable tablet Chew 2 tablets by mouth daily as needed for heartburn.     Cholecalciferol 25 MCG (1000 UT) tablet Take 1,000 Units by mouth daily.     dapagliflozin propanediol (FARXIGA) 10 MG TABS tablet Take 1 tablet (10 mg total) by mouth daily before breakfast. 30 tablet 6   ELIQUIS 5 MG TABS tablet TAKE ONE TABLET TWICE DAILY 180 tablet 1   ezetimibe (ZETIA) 10  MG tablet TAKE 1 TABLET BY MOUTH DAILY 90 tablet 3   furosemide (LASIX) 40 MG tablet TAKE 1 TABLET BY MOUTH DAILY. TAKE AN EXTRA TABLET AS NEEDED AFTER LUNCH FOR ABDOMINAL SWELLING, LEG SWELLING OR SHORTNESS OF BREATH 180 tablet 1   gabapentin (NEURONTIN) 600 MG tablet TAKE ONE TABLET BY MOUTH AT BEDTIME 90 tablet 0   Magnesium 300 MG CAPS Take by mouth.     Menthol, Topical Analgesic, (BIOFREEZE EX) Apply 1 application. topically daily as needed (Neck pain).     metaxalone (SKELAXIN) 800 MG tablet Take 1 tablet (800 mg total) by mouth daily as needed for muscle spasms. 30 tablet 2   Multiple Vitamin (MULTIVITAMIN) capsule Take 1 capsule by mouth daily.     mupirocin ointment (BACTROBAN) 2 % Place 1 application  into the nose 2 (two) times daily. 22 g 0   potassium chloride (KLOR-CON) 10 MEQ tablet TAKE 1 TABLET BY MOUTH DAILY 30 tablet 5   Tafamidis (VYNDAMAX) 61 MG CAPS Take 1 capsule (61 mg total) by mouth daily. 30 capsule 11   Tiotropium Bromide-Olodaterol (STIOLTO RESPIMAT) 2.5-2.5 MCG/ACT AERS Inhale 2 puffs into the lungs daily. 4 g 0   No current facility-administered  medications for this visit.    OBJECTIVE: Vitals:   08/03/23 1132  BP: 139/66  Pulse: 69  Resp: 18  Temp: 98.6 F (37 C)  SpO2: 98%     Body mass index is 33.8 kg/m.    ECOG FS:0 - Asymptomatic  General: Well-developed, well-nourished, no acute distress. Eyes: Pink conjunctiva, anicteric sclera. HEENT: Normocephalic, moist mucous membranes. Lungs: No audible wheezing or coughing. Heart: Regular rate and rhythm. Abdomen: Soft, nontender, no obvious distention. Musculoskeletal: No edema, cyanosis, or clubbing. Neuro: Alert, answering all questions appropriately. Cranial nerves grossly intact. Skin: No rashes or petechiae noted. Psych: Normal affect. Lymphatics: No cervical, calvicular, axillary or inguinal LAD.   LAB RESULTS:  Lab Results  Component Value Date   NA 137 07/09/2023   K 3.8 07/09/2023   CL 104 07/09/2023   CO2 21 (L) 07/09/2023   GLUCOSE 116 (H) 07/09/2023   BUN 15 07/09/2023   CREATININE 0.83 07/09/2023   CALCIUM 9.2 07/09/2023   PROT 6.9 07/09/2023   ALBUMIN 3.7 07/09/2023   AST 21 07/09/2023   ALT 16 07/09/2023   ALKPHOS 98 07/09/2023   BILITOT 1.3 (H) 07/09/2023   GFRNONAA >60 07/09/2023   GFRAA 75 12/09/2019    Lab Results  Component Value Date   WBC 11.0 (H) 07/09/2023   NEUTROABS 6.0 05/24/2023   HGB 13.5 07/09/2023   HCT 41.0 07/09/2023   MCV 94.5 07/09/2023   PLT 236 07/09/2023     STUDIES: CUP PACEART REMOTE DEVICE CHECK Result Date: 07/26/2023 Scheduled remote reviewed. Normal device function.  Next remote 91 days. ML, CVRS  MYOCARDIAL AMYLOID IMAGING PLANAR AND SPECT Result Date: 07/25/2023   Myocardial uptake was positive for radiotracer uptake. The visual grade of myocardial uptake relative to the ribs was Grade 2 (Myocardial uptake equal to rib uptake).   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   CT ABDOMEN PELVIS W CONTRAST Result Date: 07/09/2023 CLINICAL DATA:  Left lower quadrant abdominal  pain EXAM: CT ABDOMEN AND PELVIS WITH CONTRAST TECHNIQUE: Multidetector CT imaging of the abdomen and pelvis was performed using the standard protocol following bolus administration of intravenous contrast. RADIATION DOSE REDUCTION: This exam was performed according to the departmental dose-optimization program which includes automated exposure  control, adjustment of the mA and/or kV according to patient size and/or use of iterative reconstruction technique. CONTRAST:  OMNIPAQUE IOHEXOL 300 MG/ML  SOLN COMPARISON:  11/05/2018 FINDINGS: Lower chest: No acute pleural or parenchymal lung disease. Cardiomegaly without pericardial effusion. Dual lead cardiac pacer partially visualized. Hepatobiliary: Small calcified gallstones without evidence of acute cholecystitis. Liver is unremarkable. No biliary duct dilation. Pancreas: Unremarkable. No pancreatic ductal dilatation or surrounding inflammatory changes. Spleen: Normal in size without focal abnormality. Adrenals/Urinary Tract: Adrenal glands are unremarkable. Kidneys are normal, without renal calculi, focal lesion, or hydronephrosis. Bladder is unremarkable. Stomach/Bowel: No bowel obstruction or ileus. There is distal colonic diverticulosis, with wall thickening and pericolonic fat stranding involving the proximal sigmoid colon consistent with acute diverticulitis. No evidence of perforation, fluid collection, or abscess. Normal appendix right lower quadrant. Vascular/Lymphatic: Aortic atherosclerosis. No enlarged abdominal or pelvic lymph nodes. Reproductive: Uterus and bilateral adnexa are unremarkable. Other: Trace free fluid within the left lower quadrant and pelvis. No free intraperitoneal gas. No abdominal wall hernia. Musculoskeletal: Unremarkable left hip arthroplasty. No acute or destructive bony abnormalities. Reconstructed images demonstrate no additional findings. IMPRESSION: 1. Acute uncomplicated sigmoid diverticulitis. No perforation, fluid  collection, or abscess. 2. Cholelithiasis without cholecystitis. 3. Trace free fluid within the left lower quadrant and pelvis. 4.  Aortic Atherosclerosis (ICD10-I70.0). 5. Cardiomegaly. Electronically Signed   By: Sharlet Salina M.D.   On: 07/09/2023 15:47    ASSESSMENT: MGUS.  PLAN:    MGUS: Patient noted to have a mildly elevated M spike of 0.5 with an IgM predominance.  She has no evidence of endorgan damage.  Repeat laboratory work from today is pending at time of dictation.  No intervention is needed.  Patient does not require imaging or bone marrow biopsy at this time.  Return to clinic in 3 months with repeat repeat laboratory work and further evaluation.  I spent a total of 45 minutes reviewing chart data, face-to-face evaluation with the patient, counseling and coordination of care as detailed above.   Patient expressed understanding and was in agreement with this plan. She also understands that She can call clinic at any time with any questions, concerns, or complaints.    Jeralyn Ruths, MD   08/03/2023 11:58 AM

## 2023-08-03 NOTE — Patient Instructions (Signed)
 Discharge Patient Instructions  Patient Details  Name: Lauren Lloyd MRN: [id number] Date of Birth: June 28, 1940 Referring Provider:  Erasmo Downer, MD Number of Visits: 36/36  Reason for Discharge:  Patient reached a stable level of exercise. Patient independent in their exercise. Patient has met program and personal goals. Diagnosis:  Heart failure, diastolic, chronic (HCC)  Initial Exercise Prescription:  Initial Exercise Prescription - 02/13/23 1600       Date of Initial Exercise RX and Referring Provider   Date 02/13/23    Referring Provider Julien Nordmann, MD      Oxygen   Maintain Oxygen Saturation 88% or higher      Recumbant Bike   Level 1    RPM 50    Watts 14    Minutes 15    METs 1.54      NuStep   Level 1    SPM 80    Minutes 15    METs 1.54      Biostep-RELP   Level 1    SPM 50    Minutes 15    METs 1.54      Track   Laps 8    Minutes 15    METs 1.44      Prescription Details   Frequency (times per week) 2    Duration Progress to 30 minutes of continuous aerobic without signs/symptoms of physical distress      Intensity   THRR 40-80% of Max Heartrate 96-123    Ratings of Perceived Exertion 11-13    Perceived Dyspnea 0-4      Progression   Progression Continue to progress workloads to maintain intensity without signs/symptoms of physical distress.      Resistance Training   Training Prescription Yes    Weight 5lb    Reps 10-15            Discharge Exercise Prescription (Final Exercise Prescription Changes):  Exercise Prescription Changes - 07/27/23 1800       Response to Exercise   Blood Pressure (Admit) 152/80    Blood Pressure (Exit) 144/70    Heart Rate (Admit) 69 bpm    Heart Rate (Exercise) 130 bpm    Heart Rate (Exit) 71 bpm    Oxygen Saturation (Admit) 96 %    Oxygen Saturation (Exercise) 93 %    Oxygen Saturation (Exit) 97 %    Rating of Perceived Exertion (Exercise) 13    Perceived Dyspnea (Exercise) 2     Symptoms none    Duration Continue with 30 min of aerobic exercise without signs/symptoms of physical distress.    Intensity THRR unchanged      Progression   Progression Continue to progress workloads to maintain intensity without signs/symptoms of physical distress.    Average METs 2.64      Resistance Training   Training Prescription Yes    Weight 6 lb    Reps 10-15      Interval Training   Interval Training No      Biostep-RELP   Level 1    Minutes 15    METs 2      Track   Laps 42   Hall   Minutes 15    METs 3.28      Home Exercise Plan   Plans to continue exercise at Home (comment)   Sarea plans to try walking for 30 minutes twice a week, outside at home. She struggles walking for 15 minutes in the program but states she  can break up the home ex. to 15 minute intervals.   Frequency Add 2 additional days to program exercise sessions.    Initial Home Exercises Provided 04/20/23      Oxygen   Maintain Oxygen Saturation 88% or higher            Functional Capacity:  6 Minute Walk     Row Name 02/13/23 1638 08/03/23 1558       6 Minute Walk   Phase Initial Discharge    Distance 945 feet 1235 feet    Distance % Change -- 30 %    Distance Feet Change -- 290 ft    Walk Time 6 minutes 6 minutes    # of Rest Breaks 0 0    MPH 1.79 2.34    METS 1.54 2.3    RPE 15 13    Perceived Dyspnea  3 2    VO2 Peak 5.41 8.04    Symptoms Yes (comment) No    Comments back pain --    Resting HR 70 bpm 70 bpm    Resting BP 130/74 128/68    Resting Oxygen Saturation  92 % 97 %    Exercise Oxygen Saturation  during 6 min walk 91 % 94 %    Max Ex. HR 111 bpm 133 bpm    Max Ex. BP 166/72 162/72    2 Minute Post BP 150/74 142/70      Interval HR   1 Minute HR 103 85    2 Minute HR 107 107    3 Minute HR 109 112    4 Minute HR 111 126    5 Minute HR 82 128    6 Minute HR 83 133    2 Minute Post HR 70 72    Interval Heart Rate? Yes Yes      Interval Oxygen   Interval  Oxygen? Yes Yes    Baseline Oxygen Saturation % 92 % 97 %    1 Minute Oxygen Saturation % 91 % 97 %    1 Minute Liters of Oxygen 0 L 0 L    2 Minute Oxygen Saturation % 92 % 96 %    2 Minute Liters of Oxygen 0 L 0 L    3 Minute Oxygen Saturation % 91 % 95 %    3 Minute Liters of Oxygen 0 L 0 L    4 Minute Oxygen Saturation % 91 % 94 %    4 Minute Liters of Oxygen 0 L 0 L    5 Minute Oxygen Saturation % 95 % 96 %    5 Minute Liters of Oxygen 0 L 0 L    6 Minute Oxygen Saturation % 93 % 96 %    6 Minute Liters of Oxygen 0 L 0 L    2 Minute Post Oxygen Saturation % 96 % 98 %    2 Minute Post Liters of Oxygen 0 L 0 L            Nutrition & Weight - Outcomes:  Pre Biometrics - 02/13/23 1644       Pre Biometrics   Height 5' 6.2" (1.681 m)    Weight 216 lb 1.6 oz (98 kg)    Waist Circumference 45.5 inches    Hip Circumference 50 inches    Waist to Hip Ratio 0.91 %    BMI (Calculated) 34.69    Single Leg Stand 1.5 seconds  Post Biometrics - 08/03/23 1605        Post  Biometrics   Height 5\' 7"  (1.702 m)    Weight 215 lb (97.5 kg)    Waist Circumference 45.5 inches    Hip Circumference 50 inches    Waist to Hip Ratio 0.91 %    BMI (Calculated) 33.67    Single Leg Stand 1.5 seconds            Nutrition:  Nutrition Therapy & Goals - 02/16/23 0947       Nutrition Therapy   Diet Cardiac, low na    Protein (specify units) 90    Fiber 25 grams    Whole Grain Foods 3 servings    Saturated Fats 15 max. grams    Fruits and Vegetables 5 servings/day    Sodium 2 grams      Personal Nutrition Goals   Nutrition Goal Look into healthy snacks to help be consistent in not missing meals    Personal Goal #2 Eat a protein at every meals    Comments Patient drinking 24oz of water daily, has been drinking this much to avoid fluid on her chest, making it hard to breath. Spoke to her about watching her sodium intake as well. She reads labels and is knowledgeable of  basics on nutrition. Reviewed Mediterranean diet handout, educated on types of fats, sources, and how to read them on label. Encouraged more veggies at larger meals or when eating poor food choices like fried chicken. She reports that sometimes she gets busy and misses meals. Recommended several smaller meals and snacks with quick grab and go friendly foods to try and be more consistent. Overall, she is doing well and is knowledgeable of how to build balanced plates      Intervention Plan   Intervention Prescribe, educate and counsel regarding individualized specific dietary modifications aiming towards targeted core components such as weight, hypertension, lipid management, diabetes, heart failure and other comorbidities.;Nutrition handout(s) given to patient.    Expected Outcomes Short Term Goal: Understand basic principles of dietary content, such as calories, fat, sodium, cholesterol and nutrients.;Short Term Goal: A plan has been developed with personal nutrition goals set during dietitian appointment.;Long Term Goal: Adherence to prescribed nutrition plan.

## 2023-08-04 LAB — IGG, IGA, IGM
IgA: 297 mg/dL (ref 64–422)
IgG (Immunoglobin G), Serum: 947 mg/dL (ref 586–1602)
IgM (Immunoglobulin M), Srm: 242 mg/dL — ABNORMAL HIGH (ref 26–217)

## 2023-08-04 LAB — BETA 2 MICROGLOBULIN, SERUM: Beta-2 Microglobulin: 2.6 mg/L — ABNORMAL HIGH (ref 0.6–2.4)

## 2023-08-04 LAB — KAPPA/LAMBDA LIGHT CHAINS
Kappa free light chain: 26.1 mg/L — ABNORMAL HIGH (ref 3.3–19.4)
Kappa, lambda light chain ratio: 1.03 (ref 0.26–1.65)
Lambda free light chains: 25.4 mg/L (ref 5.7–26.3)

## 2023-08-07 ENCOUNTER — Encounter: Payer: Medicare Other | Admitting: *Deleted

## 2023-08-07 ENCOUNTER — Other Ambulatory Visit: Payer: Self-pay | Admitting: Cardiovascular Disease

## 2023-08-07 DIAGNOSIS — I5032 Chronic diastolic (congestive) heart failure: Secondary | ICD-10-CM | POA: Diagnosis not present

## 2023-08-07 LAB — PROTEIN ELECTROPHORESIS, SERUM
A/G Ratio: 1.1 (ref 0.7–1.7)
Albumin ELP: 3.5 g/dL (ref 2.9–4.4)
Alpha-1-Globulin: 0.2 g/dL (ref 0.0–0.4)
Alpha-2-Globulin: 0.7 g/dL (ref 0.4–1.0)
Beta Globulin: 1.1 g/dL (ref 0.7–1.3)
Gamma Globulin: 1.2 g/dL (ref 0.4–1.8)
Globulin, Total: 3.2 g/dL (ref 2.2–3.9)
M-Spike, %: 0.4 g/dL — ABNORMAL HIGH
Total Protein ELP: 6.7 g/dL (ref 6.0–8.5)

## 2023-08-07 NOTE — Progress Notes (Signed)
 Daily Session Note  Patient Details  Name: ALFRIEDA TARRY MRN: [id number] Date of Birth: 27-Mar-1941 Referring Provider:   Flowsheet Row Pulmonary Rehab from 02/13/2023 in [facility] Cardiac and Pulmonary Rehab  Referring Provider Julien Nordmann, MD       Encounter Date: 08/07/2023  Check In:  Session Check In - 08/07/23 1538       Check-In   Supervising physician immediately available to respond to emergencies See telemetry face sheet for immediately available ER MD    Location [facility]-Cardiac & Pulmonary Rehab    Staff Present Susann Givens RN,BSN;Joseph [facility] BS, Exercise Physiologist    Virtual Visit No    Medication changes reported     No    Fall or balance concerns reported    No    Warm-up and Cool-down Performed on first and last piece of equipment    Resistance Training Performed Yes    VAD Patient? No    PAD/SET Patient? No      Pain Assessment   Currently in Pain? No/denies                Social History   Tobacco Use  Smoking Status Former   Current packs/day: 0.00   Average packs/day: 1 pack/day for 30.0 years (30.0 ttl pk-yrs)   Types: Cigarettes   Start date: 05/17/1959   Quit date: 05/16/1989   Years since quitting: 34.2   Passive exposure: Past  Smokeless Tobacco Never    Goals Met:  Independence with exercise equipment Exercise tolerated well No report of concerns or symptoms today Strength training completed today  Goals Unmet:  Not Applicable  Comments: Pt able to follow exercise prescription today without complaint.  Will continue to monitor for progression.    Dr. Bethann Punches is Medical Director for [facility] Cardiac Rehabilitation.  Dr. Vida Rigger is Medical Director for [facility] Pulmonary Rehabilitation.

## 2023-08-08 ENCOUNTER — Ambulatory Visit: Payer: Medicare Other

## 2023-08-10 ENCOUNTER — Ambulatory Visit: Payer: Medicare Other

## 2023-08-11 ENCOUNTER — Telehealth: Payer: Self-pay

## 2023-08-11 DIAGNOSIS — H8110 Benign paroxysmal vertigo, unspecified ear: Secondary | ICD-10-CM

## 2023-08-11 DIAGNOSIS — R42 Dizziness and giddiness: Secondary | ICD-10-CM

## 2023-08-11 NOTE — Telephone Encounter (Signed)
 Copied from CRM [id number]. Topic: Referral - Question >> Aug 11, 2023  8:39 AM Izetta Dakin wrote: Reason for CRM: Patient requesting information on a vertigo table that was discussed during previous appointment with provider. Patient requesting a callback.  CB# [phone].

## 2023-08-14 NOTE — Telephone Encounter (Signed)
 We discussed vestibular PT, where they sometimes use a vestibular table.  Maybe this is what she means. If she would like a referral for vestibular PT, ok to place order.

## 2023-08-15 ENCOUNTER — Other Ambulatory Visit (HOSPITAL_COMMUNITY): Payer: Self-pay

## 2023-08-15 ENCOUNTER — Ambulatory Visit: Payer: Medicare Other

## 2023-08-15 NOTE — Telephone Encounter (Signed)
 LVMTCB Ok for E2C2 to clarify and place order per provider

## 2023-08-16 ENCOUNTER — Other Ambulatory Visit: Payer: Self-pay

## 2023-08-16 ENCOUNTER — Encounter: Payer: Medicare Other | Attending: Cardiovascular Disease | Admitting: *Deleted

## 2023-08-16 DIAGNOSIS — I5032 Chronic diastolic (congestive) heart failure: Secondary | ICD-10-CM | POA: Insufficient documentation

## 2023-08-16 NOTE — Progress Notes (Signed)
 Specialty Pharmacy Refill Coordination Note  Lauren Lloyd is a 83 y.o. female contacted today regarding refills of specialty medication(s) Tafamidis [name])   Patient requested Delivery   Delivery date: 08/22/23   Verified address: [address] [place] [address]   Medication will be filled on 04.08.25.

## 2023-08-16 NOTE — Progress Notes (Signed)
 Daily Session Note  Patient Details  Name: Lauren Lloyd MRN: [id number] Date of Birth: 1940-09-22 Referring Provider:   Flowsheet Row Pulmonary Rehab from 02/13/2023 in [facility] Cardiac and Pulmonary Rehab  Referring Provider Julien Nordmann, MD       Encounter Date: 08/16/2023  Check In:  Session Check In - 08/16/23 1540       Check-In   Supervising physician immediately available to respond to emergencies See telemetry face sheet for immediately available ER MD    Location [facility]-Cardiac & Pulmonary Rehab    Staff Present Susann Givens RN,BSN;Joseph [facility] Madilyn Fireman BS, ACSM CEP, Exercise Physiologist;Jason Wallace Cullens RDN,LDN    Virtual Visit No    Medication changes reported     No    Fall or balance concerns reported    No    Warm-up and Cool-down Performed on first and last piece of equipment    Resistance Training Performed Yes    VAD Patient? No    PAD/SET Patient? No      Pain Assessment   Currently in Pain? No/denies                Social History   Tobacco Use  Smoking Status Former   Current packs/day: 0.00   Average packs/day: 1 pack/day for 30.0 years (30.0 ttl pk-yrs)   Types: Cigarettes   Start date: 05/17/1959   Quit date: 05/16/1989   Years since quitting: 34.2   Passive exposure: Past  Smokeless Tobacco Never    Goals Met:  Independence with exercise equipment Exercise tolerated well No report of concerns or symptoms today Strength training completed today  Goals Unmet:  Not Applicable  Comments: Pt able to follow exercise prescription today without complaint.  Will continue to monitor for progression.    Dr. Bethann Punches is Medical Director for [facility] Cardiac Rehabilitation.  Dr. Vida Rigger is Medical Director for [facility] Pulmonary Rehabilitation.

## 2023-08-16 NOTE — Telephone Encounter (Signed)
 Copied from CRM [id number]. Topic: Referral - Question >> Aug 16, 2023 10:01 AM [name] Kitchen D wrote: Advised patient the order will be placed. Please place the order I was not able to. >> Aug 16, 2023 10:02 AM [name] Kitchen D wrote: Copied note the patient's pcp left: We discussed vestibular PT, where they sometimes use a vestibular table.  Maybe this is what she means. If she would like a referral for vestibular PT, ok to place order.

## 2023-08-17 ENCOUNTER — Other Ambulatory Visit (HOSPITAL_COMMUNITY): Payer: Self-pay

## 2023-08-17 ENCOUNTER — Ambulatory Visit: Payer: Medicare Other

## 2023-08-22 ENCOUNTER — Other Ambulatory Visit: Payer: Self-pay

## 2023-08-22 ENCOUNTER — Ambulatory Visit: Payer: Medicare Other

## 2023-08-23 ENCOUNTER — Other Ambulatory Visit (HOSPITAL_COMMUNITY): Payer: Self-pay

## 2023-08-23 ENCOUNTER — Encounter: Payer: Self-pay | Admitting: *Deleted

## 2023-08-23 ENCOUNTER — Encounter: Payer: Medicare Other | Admitting: *Deleted

## 2023-08-23 DIAGNOSIS — I5032 Chronic diastolic (congestive) heart failure: Secondary | ICD-10-CM

## 2023-08-23 NOTE — Progress Notes (Signed)
 Daily Session Note  Patient Details  Name: SHAREEN CAPWELL MRN: [id number] Date of Birth: 1940/08/11 Referring Provider:   Flowsheet Row Pulmonary Rehab from 02/13/2023 in [facility] Cardiac and Pulmonary Rehab  Referring Provider Julien Nordmann, MD       Encounter Date: 08/23/2023  Check In:  Session Check In - 08/23/23 1542       Check-In   Supervising physician immediately available to respond to emergencies See telemetry face sheet for immediately available ER MD    Location [facility]-Cardiac & Pulmonary Rehab    Staff Present Susann Givens RN,BSN;Joseph Hollace Kinnier;Cora Collum, RN, BSN, CCRP    Virtual Visit No    Medication changes reported     No    Fall or balance concerns reported    No    Warm-up and Cool-down Performed on first and last piece of equipment    Resistance Training Performed Yes    VAD Patient? No    PAD/SET Patient? No      Pain Assessment   Currently in Pain? No/denies                Social History   Tobacco Use  Smoking Status Former   Current packs/day: 0.00   Average packs/day: 1 pack/day for 30.0 years (30.0 ttl pk-yrs)   Types: Cigarettes   Start date: 05/17/1959   Quit date: 05/16/1989   Years since quitting: 34.2   Passive exposure: Past  Smokeless Tobacco Never    Goals Met:  Independence with exercise equipment Exercise tolerated well No report of concerns or symptoms today Strength training completed today  Goals Unmet:  Not Applicable  Comments: Pt able to follow exercise prescription today without complaint.  Will continue to monitor for progression.    Dr. Bethann Punches is Medical Director for [facility] Cardiac Rehabilitation.  Dr. Vida Rigger is Medical Director for [facility] Pulmonary Rehabilitation.

## 2023-08-23 NOTE — Progress Notes (Signed)
 Pulmonary Individual Treatment Plan  Patient Details  Name: Lauren Lloyd MRN: [id number] Date of Birth: November 08, 1940 Referring Provider:   Flowsheet Row Pulmonary Rehab from 02/13/2023 in [facility] Cardiac and Pulmonary Rehab  Referring Provider Julien Nordmann, MD       Initial Encounter Date:  Flowsheet Row Pulmonary Rehab from 02/13/2023 in [facility] Cardiac and Pulmonary Rehab  Date 02/13/23       Visit Diagnosis: Heart failure, diastolic, chronic (HCC)  Patient's Home Medications on Admission:  Current Outpatient Medications:    allopurinol (ZYLOPRIM) 100 MG tablet, TAKE ONE TABLET BY MOUTH EVERY DAY, Disp: 90 tablet, Rfl: 1   calcium carbonate (TUMS EX) 750 MG chewable tablet, Chew 2 tablets by mouth daily as needed for heartburn., Disp: , Rfl:    Cholecalciferol 25 MCG (1000 UT) tablet, Take 1,000 Units by mouth daily., Disp: , Rfl:    dapagliflozin propanediol (FARXIGA) 10 MG TABS tablet, Take 1 tablet (10 mg total) by mouth daily before breakfast., Disp: 30 tablet, Rfl: 6   ELIQUIS 5 MG TABS tablet, TAKE ONE TABLET TWICE DAILY, Disp: 180 tablet, Rfl: 1   ezetimibe (ZETIA) 10 MG tablet, TAKE 1 TABLET BY MOUTH DAILY, Disp: 90 tablet, Rfl: 3   furosemide (LASIX) 40 MG tablet, TAKE 1 TABLET BY MOUTH DAILY. TAKE AN EXTRA TABLET AS NEEDED AFTER LUNCH FOR ABDOMINAL SWELLING, LEG SWELLING OR SHORTNESS OF BREATH, Disp: 180 tablet, Rfl: 1   gabapentin (NEURONTIN) 600 MG tablet, TAKE ONE TABLET BY MOUTH AT BEDTIME, Disp: 90 tablet, Rfl: 0   Magnesium 300 MG CAPS, Take by mouth., Disp: , Rfl:    Menthol, Topical Analgesic, (BIOFREEZE EX), Apply 1 application. topically daily as needed (Neck pain)., Disp: , Rfl:    metaxalone (SKELAXIN) 800 MG tablet, Take 1 tablet (800 mg total) by mouth daily as needed for muscle spasms., Disp: 30 tablet, Rfl: 2   Multiple Vitamin (MULTIVITAMIN) capsule, Take 1 capsule by mouth daily., Disp: , Rfl:    mupirocin ointment (BACTROBAN) 2 %, Place 1 application  into  the nose 2 (two) times daily., Disp: 22 g, Rfl: 0   potassium chloride (KLOR-CON) 10 MEQ tablet, TAKE 1 TABLET BY MOUTH DAILY, Disp: 30 tablet, Rfl: 5   Tafamidis (VYNDAMAX) 61 MG CAPS, Take 1 capsule (61 mg total) by mouth daily., Disp: 30 capsule, Rfl: 11   Tiotropium Bromide-Olodaterol (STIOLTO RESPIMAT) 2.5-2.5 MCG/ACT AERS, Inhale 2 puffs into the lungs daily., Disp: 4 g, Rfl: 0  Past Medical History: Past Medical History:  Diagnosis Date   (HFpEF) heart failure with preserved ejection fraction (HCC)    a. 05/2018 Echo: EF 55-60%, no rwma, mild to mod MR. Nl RV fxn. Mod TR. PASP .   Arthritis    knees, Hands   Arthritis of knee    Back pain    Carotid arterial disease (HCC)    a. 03/2019 Carotid U/S: <50% bilat ICA stenoses.   CHF (congestive heart failure) (HCC)    Cholelithiasis    a. 10/2018 noted on CT.   Edema, lower extremity    Fatty liver    GERD (gastroesophageal reflux disease)    History of stress test    a. 06/2018 MV: EF 59%, no ischemia/infarct. Low risk.   Hypertension    Knee pain    Lactose intolerance    Mitral regurgitation    a. 05/2018 Echo: mild to mod MR.   Multinodular goiter    Obesity    OSA (obstructive sleep apnea)  PAF (paroxysmal atrial fibrillation) (HCC)    a.  Diagnosed 12/19; b. 05/2018 s/p DCCV; c. 03/2019 & 05/2019 recurrent AFib-->managed w/ amio load; d. CHADS2VASc = 6 (CHF, HTN, age x 2, vascular disease, female)-->Eliquis & amio 100 qd.   PAH (pulmonary artery hypertension) (HCC)    RSV (acute bronchiolitis due to respiratory syncytial virus) 05/10/2022   Scoliosis    SOB (shortness of breath)    Swallowing difficulty     Tobacco Use: Social History   Tobacco Use  Smoking Status Former   Current packs/day: 0.00   Average packs/day: 1 pack/day for 30.0 years (30.0 ttl pk-yrs)   Types: Cigarettes   Start date: 05/17/1959   Quit date: 05/16/1989   Years since quitting: 34.2   Passive exposure: Past  Smokeless Tobacco Never     Labs: Review Flowsheet  More data exists      Latest Ref Rng & Units 12/09/2019 08/03/2020 08/16/2021 03/17/2022 10/28/2022  Labs for ITP Cardiac and Pulmonary Rehab  Cholestrol 100 - 199 mg/dL 161  096  CANCELED  045  176   LDL (calc) 0 - 99 mg/dL 409  811  - 93  914   HDL-C >39 mg/dL 73  65  CANCELED  52  54   Trlycerides 0 - 149 mg/dL 782  956  CANCELED  94  126   Hemoglobin A1c 4.8 - 5.6 % 5.5  - - 6.0  6.0      Pulmonary Assessment Scores:  Pulmonary Assessment Scores     Row Name 08/03/23 1606         ADL UCSD   ADL Phase Exit       mMRC Score   mMRC Score 2              UCSD: Self-administered rating of dyspnea associated with activities of daily living (ADLs) 6-point scale (0 = "not at all" to 5 = "maximal or unable to do because of breathlessness")  Scoring Scores range from 0 to 120.  Minimally important difference is 5 units  CAT: CAT can identify the health impairment of COPD patients and is better correlated with disease progression.  CAT has a scoring range of zero to 40. The CAT score is classified into four groups of low (less than 10), medium (10 - 20), high (21-30) and very high (31-40) based on the impact level of disease on health status. A CAT score over 10 suggests significant symptoms.  A worsening CAT score could be explained by an exacerbation, poor medication adherence, poor inhaler technique, or progression of COPD or comorbid conditions.  CAT MCID is 2 points  mMRC: mMRC (Modified Medical Research Council) Dyspnea Scale is used to assess the degree of baseline functional disability in patients of respiratory disease due to dyspnea. No minimal important difference is established. A decrease in score of 1 point or greater is considered a positive change.   Pulmonary Function Assessment:   Exercise Target Goals: Exercise Program Goal: Individual exercise prescription set using results from initial 6 min walk test and THRR while considering   patient's activity barriers and safety.   Exercise Prescription Goal: Initial exercise prescription builds to 30-45 minutes a day of aerobic activity, 2-3 days per week.  Home exercise guidelines will be given to patient during program as part of exercise prescription that the participant will acknowledge.  Education: Aerobic Exercise: - Group verbal and visual presentation on the components of exercise prescription. Introduces F.I.T.T principle from ACSM for exercise  prescriptions.  Reviews F.I.T.T. principles of aerobic exercise including progression. Written material given at graduation.   Education: Resistance Exercise: - Group verbal and visual presentation on the components of exercise prescription. Introduces F.I.T.T principle from ACSM for exercise prescriptions  Reviews F.I.T.T. principles of resistance exercise including progression. Written material given at graduation.    Education: Exercise & Equipment Safety: - Individual verbal instruction and demonstration of equipment use and safety with use of the equipment. Flowsheet Row Pulmonary Rehab from 04/27/2023 in [facility] Cardiac and Pulmonary Rehab  Date 02/13/23  Educator MB  Instruction Review Code 1- Verbalizes Understanding       Education: Exercise Physiology & General Exercise Guidelines: - Group verbal and written instruction with models to review the exercise physiology of the cardiovascular system and associated critical values. Provides general exercise guidelines with specific guidelines to those with heart or lung disease.  Flowsheet Row Pulmonary Rehab from 04/27/2023 in [facility] Cardiac and Pulmonary Rehab  Education need identified 02/13/23       Education: Flexibility, Balance, Mind/Body Relaxation: - Group verbal and visual presentation with interactive activity on the components of exercise prescription. Introduces F.I.T.T principle from ACSM for exercise prescriptions. Reviews F.I.T.T. principles of flexibility  and balance exercise training including progression. Also discusses the mind body connection.  Reviews various relaxation techniques to help reduce and manage stress (i.e. Deep breathing, progressive muscle relaxation, and visualization). Balance handout provided to take home. Written material given at graduation.   Activity Barriers & Risk Stratification:   6 Minute Walk:  6 Minute Walk     Row Name 08/03/23 1558         6 Minute Walk   Phase Discharge     Distance 1235 feet     Distance % Change 30 %     Distance Feet Change 290 ft     Walk Time 6 minutes     # of Rest Breaks 0     MPH 2.34     METS 2.3     RPE 13     Perceived Dyspnea  2     VO2 Peak 8.04     Symptoms No     Resting HR 70 bpm     Resting BP 128/68     Resting Oxygen Saturation  97 %     Exercise Oxygen Saturation  during 6 min walk 94 %     Max Ex. HR 133 bpm     Max Ex. BP 162/72     2 Minute Post BP 142/70       Interval HR   1 Minute HR 85     2 Minute HR 107     3 Minute HR 112     4 Minute HR 126     5 Minute HR 128     6 Minute HR 133     2 Minute Post HR 72     Interval Heart Rate? Yes       Interval Oxygen   Interval Oxygen? Yes     Baseline Oxygen Saturation % 97 %     1 Minute Oxygen Saturation % 97 %     1 Minute Liters of Oxygen 0 L     2 Minute Oxygen Saturation % 96 %     2 Minute Liters of Oxygen 0 L     3 Minute Oxygen Saturation % 95 %     3 Minute Liters of Oxygen 0 L  4 Minute Oxygen Saturation % 94 %     4 Minute Liters of Oxygen 0 L     5 Minute Oxygen Saturation % 96 %     5 Minute Liters of Oxygen 0 L     6 Minute Oxygen Saturation % 96 %     6 Minute Liters of Oxygen 0 L     2 Minute Post Oxygen Saturation % 98 %     2 Minute Post Liters of Oxygen 0 L             Oxygen Initial Assessment:   Oxygen Re-Evaluation:  Oxygen Re-Evaluation     Row Name 03/02/23 0802 04/04/23 0850 05/30/23 0814 06/08/23 0844 07/24/23 1620     Program Oxygen  Prescription   Program Oxygen Prescription None None None None None     Home Oxygen   Home Oxygen Device None None None None None   Sleep Oxygen Prescription CPAP CPAP CPAP CPAP CPAP   Liters per minute -- --  off and on --  off and on --  off and on --   Home Exercise Oxygen Prescription None None None None None   Home Resting Oxygen Prescription None None None None None   Compliance with Home Oxygen Use No No No No No     Goals/Expected Outcomes   Short Term Goals To learn and demonstrate proper pursed lip breathing techniques or other breathing techniques.  To learn and demonstrate proper pursed lip breathing techniques or other breathing techniques.  To learn and demonstrate proper pursed lip breathing techniques or other breathing techniques.  To learn and demonstrate proper pursed lip breathing techniques or other breathing techniques.  To learn and demonstrate proper use of respiratory medications   Long  Term Goals Exhibits proper breathing techniques, such as pursed lip breathing or other method taught during program session Exhibits proper breathing techniques, such as pursed lip breathing or other method taught during program session Exhibits proper breathing techniques, such as pursed lip breathing or other method taught during program session Exhibits proper breathing techniques, such as pursed lip breathing or other method taught during program session Exhibits proper breathing techniques, such as pursed lip breathing or other method taught during program session   Comments Informed patient how to perform the Pursed Lipped breathing technique. Told patient to Inhale through the nose and out the mouth with pursed lips to keep their airways open, help oxygenate them better, practice when at rest or doing strenuous activity. Patient Verbalizes understanding of technique and will work on and be reiterated during [facility]. Reviewed pursed lipped breathing with patient. She verbalized  understanding and will continue to work on it during rest and during exercise. Checked in with Marajade to see how her pursed lipped breathing is going with rest and exercise after the goal set last review. She has been adding it in more when she is noticing shortness of breath, such as walking from the parking lot to rehab. Discussed to practice it each time she remembers to do pursed lipped breathing to build the habit. Carlye is still working on her goal to use pursed lipped breathing at rest and exercise when she remembers and is short of breath. She has noticed she is only short of breath when walking and walking uphill especially. Neyda continues to work on her breathing techniques.  She states that she gets short of breath when she is going up inclines.  She moves slow and flat when she can  to prevent any shortness of breath   Goals/Expected Outcomes Short: use PLB with exertion. Long: use PLB on exertion proficiently and independently. Short: use pursed lipped breathing with rest and exercise. Long: use pursed lipped breathing independently with exercise. Short: Consistently use pursed lipped breathing with rest and exercise when she becomes short of breath. Long: use pursed lipped breathing independently with exercise and rest. Short: Consistently use pursed lipped breathing with rest and exercise. Long: use pursed lipped breathing independently with exercise and rest. STG continue to use breathing techniques, taking time to moveLTG manages breathing and decreases shortness of breath as needed    Row Name 08/16/23 1602             Program Oxygen Prescription   Program Oxygen Prescription None         Home Oxygen   Home Oxygen Device None       Sleep Oxygen Prescription CPAP       Home Exercise Oxygen Prescription None       Home Resting Oxygen Prescription None       Compliance with Home Oxygen Use No         Goals/Expected Outcomes   Short Term Goals To learn and demonstrate proper use of  respiratory medications       Long  Term Goals Exhibits proper breathing techniques, such as pursed lip breathing or other method taught during program session       Comments Neyah is working on her PLB techniques. She reports she is able to walk more stairs before getting tired.       Goals/Expected Outcomes STG: continue to use breathing techniques. LTG: Manages breathing and decreases shortness of breath as needed                Oxygen Discharge (Final Oxygen Re-Evaluation):  Oxygen Re-Evaluation - 08/16/23 1602       Program Oxygen Prescription   Program Oxygen Prescription None      Home Oxygen   Home Oxygen Device None    Sleep Oxygen Prescription CPAP    Home Exercise Oxygen Prescription None    Home Resting Oxygen Prescription None    Compliance with Home Oxygen Use No      Goals/Expected Outcomes   Short Term Goals To learn and demonstrate proper use of respiratory medications    Long  Term Goals Exhibits proper breathing techniques, such as pursed lip breathing or other method taught during program session    Comments Dlynn is working on her PLB techniques. She reports she is able to walk more stairs before getting tired.    Goals/Expected Outcomes STG: continue to use breathing techniques. LTG: Manages breathing and decreases shortness of breath as needed             Initial Exercise Prescription:   Perform Capillary Blood Glucose checks as needed.  Exercise Prescription Changes:   Exercise Prescription Changes     Row Name 03/09/23 0800 03/22/23 1600 04/06/23 1500 04/17/23 1100 04/20/23 0700     Response to Exercise   Blood Pressure (Admit) 164/82 140/70 150/74 142/72 --   Blood Pressure (Exercise) 148/62 134/60 122/60 158/80 --   Blood Pressure (Exit) 120/58 128/56 126/64 144/70 --   Heart Rate (Admit) 72 bpm 75 bpm 91 bpm 79 bpm --   Heart Rate (Exercise) 123 bpm 128 bpm 91 bpm 93 bpm --   Heart Rate (Exit) 72 bpm 70 bpm 77 bpm 72 bpm --   Oxygen  Saturation (Admit) 99 % 99 % 91 % 100 % --   Oxygen Saturation (Exercise) 92 % 92 % 91 % 93 % --   Oxygen Saturation (Exit) 95 % 97 % 96 % 95 % --   Rating of Perceived Exertion (Exercise) 13 14 13 15  --   Perceived Dyspnea (Exercise) 2 1 0 0 --   Symptoms none none knee pain none --   Duration Progress to 30 minutes of  aerobic without signs/symptoms of physical distress Progress to 30 minutes of  aerobic without signs/symptoms of physical distress Progress to 30 minutes of  aerobic without signs/symptoms of physical distress Progress to 30 minutes of  aerobic without signs/symptoms of physical distress --   Intensity THRR unchanged THRR unchanged THRR unchanged THRR unchanged --     Progression   Progression Continue to progress workloads to maintain intensity without signs/symptoms of physical distress. Continue to progress workloads to maintain intensity without signs/symptoms of physical distress. Continue to progress workloads to maintain intensity without signs/symptoms of physical distress. Continue to progress workloads to maintain intensity without signs/symptoms of physical distress. --   Average METs 1.97 2.07 2.78 2.7 --     Resistance Training   Training Prescription Yes Yes Yes Yes --   Weight 5lb 5lb 5lb 5lb --   Reps 10-15 10-15 10-15 10-15 --     Interval Training   Interval Training No No No No --     Treadmill   MPH -- 1.4 -- 1.5 --   Grade -- 0 -- 0 --   Minutes -- 15 -- 15 --   METs -- 2.07 -- 2.15 --     Recumbant Bike   Level -- -- 1 1.5 --   Watts -- -- 14 14 --   Minutes -- -- 15 15 --   METs -- -- 2.45 2.45 --     NuStep   Level 2 2 3 4  --   Minutes 15 15 15 15  --   METs 2.1 2.6 3.1 3.6 --     Biostep-RELP   Level 1 1 -- 2 --   Minutes 15 15 -- 15 --   METs 2 2 -- -- --     Track   Laps 15  Hallway 17  hallway -- 17 --   Minutes 15 15 -- 15 --   METs 1.82 1.92 -- 1.92 --     Home Exercise Plan   Plans to continue exercise at -- -- -- -- Home  (comment)  Nia plans to try walking for 30 minutes twice a week, outside at home. She struggles walking for 15 minutes in the program but states she can break up the home ex. to 15 minute intervals.   Frequency -- -- -- -- Add 2 additional days to program exercise sessions.   Initial Home Exercises Provided -- -- -- -- 04/20/23     Oxygen   Maintain Oxygen Saturation 88% or higher 88% or higher 88% or higher 88% or higher 88% or higher    Row Name 05/04/23 1400 05/18/23 1600 05/29/23 1600 06/12/23 1700 06/28/23 1100     Response to Exercise   Blood Pressure (Admit) 148/80 128/68 128/68 162/78 140/62   Blood Pressure (Exit) 128/60 130/62 130/62 122/64 152/62   Heart Rate (Admit) 73 bpm 82 bpm 82 bpm 86 bpm 70 bpm   Heart Rate (Exercise) 91 bpm 91 bpm 91 bpm 78 bpm 99 bpm   Heart Rate (Exit) 80 bpm  90 bpm 90 bpm 81 bpm 78 bpm   Oxygen Saturation (Admit) 94 % 93 % 93 % 92 % 96 %   Oxygen Saturation (Exercise) 95 % 93 % 93 % 93 % 88 %   Oxygen Saturation (Exit) 95 % 96 % 96 % 95 % 95 %   Rating of Perceived Exertion (Exercise) 15 13 13 13 13    Perceived Dyspnea (Exercise) 3 0 0 1 1   Symptoms none none none none none   Duration Progress to 30 minutes of  aerobic without signs/symptoms of physical distress Progress to 30 minutes of  aerobic without signs/symptoms of physical distress Progress to 30 minutes of  aerobic without signs/symptoms of physical distress Continue with 30 min of aerobic exercise without signs/symptoms of physical distress. Continue with 30 min of aerobic exercise without signs/symptoms of physical distress.   Intensity THRR unchanged THRR unchanged THRR unchanged THRR unchanged THRR unchanged     Progression   Progression Continue to progress workloads to maintain intensity without signs/symptoms of physical distress. Continue to progress workloads to maintain intensity without signs/symptoms of physical distress. Continue to progress workloads to maintain intensity  without signs/symptoms of physical distress. Continue to progress workloads to maintain intensity without signs/symptoms of physical distress. Continue to progress workloads to maintain intensity without signs/symptoms of physical distress.   Average METs 2.34 2.74 2.74 3.18 2.8     Resistance Training   Training Prescription Yes Yes Yes Yes Yes   Weight 6 lb 6 lb 6 lb 6 lb 6 lb   Reps 10-15 10-15 10-15 10-15 10-15     Interval Training   Interval Training No No No No No     Treadmill   MPH 1.7 1.3 1.3 -- 1.9   Grade 0 0.5 0.5 -- 0.5   Minutes 15 15 15  -- 15   METs 2.3 2.08 2.08 -- 2.59     Recumbant Bike   Level 1 -- -- -- --   Watts 14 -- -- -- --   Minutes 15 -- -- -- --   METs 2.45 -- -- -- --     NuStep   Level 3 4 4 4 4    Minutes 15 15 15 30 30    METs 3.2 3.2 3.2 3.5 3.1     T5 Nustep   Level -- -- -- -- 3  T6   Minutes -- -- -- -- 15   METs -- -- -- -- 2.9     Biostep-RELP   Level 3 -- -- -- --   Minutes 15 -- -- -- --   METs 2 -- -- -- --     Track   Laps 17 -- -- -- --   Minutes 15 -- -- -- --   METs 1.92 -- -- -- --     Home Exercise Plan   Plans to continue exercise at Home (comment)  Shenoa plans to try walking for 30 minutes twice a week, outside at home. She struggles walking for 15 minutes in the program but states she can break up the home ex. to 15 minute intervals. Home (comment)  Marleena plans to try walking for 30 minutes twice a week, outside at home. She struggles walking for 15 minutes in the program but states she can break up the home ex. to 15 minute intervals. Home (comment)  Irelynd plans to try walking for 30 minutes twice a week, outside at home. She struggles walking for 15 minutes in the program  but states she can break up the home ex. to 15 minute intervals. Home (comment)  Rodnesha plans to try walking for 30 minutes twice a week, outside at home. She struggles walking for 15 minutes in the program but states she can break up the home ex. to 15  minute intervals. Home (comment)  Shenice plans to try walking for 30 minutes twice a week, outside at home. She struggles walking for 15 minutes in the program but states she can break up the home ex. to 15 minute intervals.   Frequency Add 2 additional days to program exercise sessions. Add 2 additional days to program exercise sessions. Add 2 additional days to program exercise sessions. Add 2 additional days to program exercise sessions. Add 2 additional days to program exercise sessions.   Initial Home Exercises Provided 04/20/23 04/20/23 04/20/23 04/20/23 04/20/23     Oxygen   Maintain Oxygen Saturation 88% or higher 88% or higher 88% or higher 88% or higher 88% or higher    Row Name 07/11/23 1500 07/27/23 1800 08/09/23 0800         Response to Exercise   Blood Pressure (Admit) 142/68 152/80 128/68     Blood Pressure (Exit) 130/60 144/70 118/64     Heart Rate (Admit) 73 bpm 69 bpm 70 bpm     Heart Rate (Exercise) 106 bpm 130 bpm 130 bpm     Heart Rate (Exit) 71 bpm 71 bpm 77 bpm     Oxygen Saturation (Admit) 97 % 96 % 97 %     Oxygen Saturation (Exercise) 91 % 93 % 93 %     Oxygen Saturation (Exit) 96 % 97 % 96 %     Rating of Perceived Exertion (Exercise) 15 13 15      Perceived Dyspnea (Exercise) 3 2 2      Symptoms none none none     Duration Continue with 30 min of aerobic exercise without signs/symptoms of physical distress. Continue with 30 min of aerobic exercise without signs/symptoms of physical distress. Continue with 30 min of aerobic exercise without signs/symptoms of physical distress.     Intensity THRR unchanged THRR unchanged THRR unchanged       Progression   Progression Continue to progress workloads to maintain intensity without signs/symptoms of physical distress. Continue to progress workloads to maintain intensity without signs/symptoms of physical distress. Continue to progress workloads to maintain intensity without signs/symptoms of physical distress.      Average METs 2.11 2.64 2.4       Resistance Training   Training Prescription Yes Yes Yes     Weight 6 lb 6 lb 6 lb     Reps 10-15 10-15 10-15       Interval Training   Interval Training No No No       Treadmill   MPH 1.5 -- --     Grade 0.5 -- --     Minutes 15 -- --     METs 2.25 -- --       T5 Nustep   Level 3 -- 3     Minutes 15 -- 15     METs 2 -- 2.1       Biostep-RELP   Level 2 1 1      Minutes 15 15 15      METs -- 2 2       Track   Laps 20 42  Hall 42  hall     Minutes 15 15 15  METs 2.09 3.28 3.28       Home Exercise Plan   Plans to continue exercise at Home (comment)  Rheta plans to try walking for 30 minutes twice a week, outside at home. She struggles walking for 15 minutes in the program but states she can break up the home ex. to 15 minute intervals. Home (comment)  Lesbia plans to try walking for 30 minutes twice a week, outside at home. She struggles walking for 15 minutes in the program but states she can break up the home ex. to 15 minute intervals. Home (comment)  Andreka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 some. Talaysha reports she is doing well.  Denies any stress or anxiety. She says she has been going on walks getting fresh air which has helped her stay mentally clear.      Expected Outcomes STG continue her exercise at a slow steady pace, stopping for any symptoms. LTG able to manage without her dizziness and any other symptoms STG: Continue to walk for exercise and mental clarity. LTG: achieve and maintain positive outlook on health and daily life      Interventions Encouraged to attend Pulmonary Rehabilitation for the exercise Encouraged to attend Pulmonary Rehabilitation for the exercise      Continue Psychosocial Services  Follow up required by staff Follow up required by staff               Psychosocial Discharge (Final Psychosocial Re-Evaluation):  Psychosocial Re-Evaluation - 08/16/23 1607       Psychosocial Re-Evaluation   Current issues with Current Stress Concerns    Comments Geniene reports she is doing well. Denies any stress or anxiety. She says she has been going on walks getting fresh air which has helped her stay mentally clear.    Expected Outcomes STG: Continue to walk for exercise and mental clarity. LTG: achieve and maintain positive outlook on health and daily life    Interventions Encouraged to attend Pulmonary Rehabilitation for the exercise    Continue Psychosocial Services  Follow up required by staff             Education: Education Goals: Education classes will be provided on a weekly basis, covering required topics. Participant will state understanding/return demonstration of topics presented.  Learning Barriers/Preferences:   General Pulmonary Education Topics:  Infection Prevention: - Provides verbal and written material to individual with discussion of infection control including proper hand washing and proper equipment cleaning during exercise session. Flowsheet Row Pulmonary Rehab from 04/27/2023 in [facility] Cardiac and Pulmonary Rehab  Date 02/13/23  Educator MB  Instruction Review Code  1- Verbalizes Understanding       Falls Prevention: - Provides verbal and written material to individual with discussion of falls prevention and safety. Flowsheet Row Pulmonary Rehab from 04/27/2023 in [facility] Cardiac and Pulmonary Rehab  Date 02/13/23  Educator MB  Instruction Review Code 1- Verbalizes Understanding       Chronic Lung Disease Review: - Group verbal instruction with posters, models, PowerPoint presentations and videos,  to review new updates, new respiratory medications, new advancements in procedures and treatments. Providing information on websites and "800" numbers for continued self-education. Includes information about supplement  oxygen, available portable oxygen systems, continuous and intermittent flow rates, oxygen safety, concentrators, and Medicare reimbursement for oxygen. Explanation of Pulmonary Drugs, including class, frequency, complications, importance of spacers, rinsing mouth after steroid MDI's, and proper cleaning methods for nebulizers. Review of basic lung anatomy and physiology related to function, structure, and complications of lung disease. Review of risk factors. Discussion about methods for diagnosing sleep apnea and types of masks and machines for OSA. Includes a review of the use of types of environmental controls: home humidity, furnaces, filters, dust mite/pet prevention, HEPA vacuums. Discussion about weather changes, air quality and the benefits of nasal washing. Instruction on Warning signs, infection symptoms, calling MD promptly, preventive modes, and value of vaccinations. Review of effective airway clearance, coughing and/or vibration techniques. Emphasizing that all should Create an Action Plan. Written material given at graduation. Flowsheet Row Pulmonary Rehab from 04/27/2023 in [facility] Cardiac and Pulmonary Rehab  Education need identified 02/13/23  Date 04/27/23  Educator [facility]  Instruction Review Code 1- Verbalizes Understanding        AED/CPR: - Group verbal and written instruction with the use of models to demonstrate the basic use of the AED with the basic ABC's of resuscitation.    Anatomy and Cardiac Procedures: - Group verbal and visual presentation and models provide information about basic cardiac anatomy and function. Reviews the testing methods done to diagnose heart disease and the outcomes of the test results. Describes the treatment choices: Medical Management, Angioplasty, or Coronary Bypass Surgery for treating various heart conditions including Myocardial Infarction, Angina, Valve Disease, and Cardiac Arrhythmias.  Written material given at graduation.   Medication Safety: - Group verbal and visual instruction to review commonly prescribed medications for heart and lung disease. Reviews the medication, class of the drug, and side effects. Includes the steps to properly store meds and maintain the prescription regimen.  Written material given at graduation.   Other: -Provides group and verbal instruction on various topics (see comments)   Knowledge Questionnaire Score:    Core Components/Risk Factors/Patient Goals at Admission:   Education:Diabetes - Individual verbal and written instruction to review signs/symptoms of diabetes, desired ranges of glucose level fasting, after meals and with exercise. Acknowledge that pre and post exercise glucose checks will be done for 3 sessions at entry of program.   Know Your Numbers and Heart Failure: - Group verbal and visual instruction to discuss disease risk factors for cardiac and pulmonary disease and treatment options.  Reviews associated critical values for Overweight/Obesity, Hypertension, Cholesterol, and Diabetes.  Discusses basics of heart failure: signs/symptoms and treatments.  Introduces Heart Failure Zone chart for action plan for heart failure.  Written material given at graduation.   Core Components/Risk Factors/Patient Goals Review:    Goals and Risk Factor Review     Row Name 03/02/23 0805 04/04/23 0916 05/02/23 0807 05/30/23 0839 06/08/23 0854     Core Components/Risk Factors/Patient Goals Review   Personal Goals Review Improve shortness of breath with ADL's Improve shortness of breath with ADL's;Hypertension Weight Management/Obesity;Hypertension Weight Management/Obesity;Hypertension;Improve shortness of breath with ADL's Weight Management/Obesity;Hypertension;Improve shortness of breath with ADL's   Review Spoke to patient about their shortness of breath and what they can do to improve. Patient has been informed of breathing techniques when starting the program. Patient is informed to tell staff if they have had any med changes and that certain meds they are taking or not taking can be causing shortness of breath. Spoke with patient about their shortness of breath and  she is seeing her pulmonologist today about her shortness of breath. She was also reminded of the breathing techniques when starting the program. Patient is not monitoring her blood pressure at home with her cuff at home. She was informed to start checking at home. Patient states that she is still trying to lose some weight but with th eholidays it is more difficult. She is still working on maintaining her diet, and trying to implement home exercise. Patient also states that she knows that she needs to be taking her blood pressure, but is currently not. She has been informed on why taking her blood pressure is import, and has been encouraged to take it at least once a day on non-program days. Darcella has returned to the program after being out and states that with the holidays and the funeral events for her brother in law, it has been very difficult to work on her weight loss goal as she was surrounded by unhealthy food and has been busy with people in her house since 12/10. She has been working on implements home exercise and working her diet. She hopes to make more  progress as the holiday events are over. She has been monitoring her blood pressure, motivated by her recent visit to the ED.She has been wroking on pursed lipped breathing to help improve her shortness of breath. Lenah has not had any progress on her weight loss goal since I last checked in due to the cold and snowy weather causing her to eat more food/portions and not being able to exercise at home with the cold. She is monitoring her blood pressure a couple times a week, and states that she has been to the doctor a lot, which helps with monitoring blood pressure. She has noticed her SOB with walking, especially with uphill, and has been working on her pursed lipped breathing when she remembers to use it to improve SOB.   Expected Outcomes Short: Attend [facility] regularly to improve shortness of breath with ADL's. Long: maintain independence with ADL's Short: Attend [facility] regularly to improve shortness of breath with ADL's and check blood pressure at home. Long: maintain independence with ADL's and independence with monitoring blood pressure at home. Short: Attend [facility] regularly to improve shortness of breath with ADL's and check blood pressure at home, and continue to practice good habits in order to lose weight. Long: maintain independence with ADL's and independence with monitoring blood pressure at home. Short: Attend [facility] consistently to improve shortness of breath with ADL's and continue to check blood pressure at home, and work on her healthy diet and home exercise in order to lose weight. Long: maintain independence with ADL's and monitoring blood pressure at home. Short: Continue to consistently attend [facility] to improve shortness of breath using pursed lipped breathing and continue to check blood pressure at home, and work on her healthy diet and home exercise in order to lose weight. Long: maintain independence with ADL's and improving shortness of breath and monitoring blood pressure  at home.    Row Name 07/24/23 1633 08/16/23 1612           Core Components/Risk Factors/Patient Goals Review   Personal Goals Review Weight Management/Obesity;Improve shortness of breath with ADL's;Hypertension;Heart Failure Improve shortness of breath with ADL's      Review Jamela has kept her weigh at 215 lbs, the weather has been a concern to be able to get out.  She did state to day that she has been able to go into  [place] and walk all around without any symptoms. She could not do that a month ago. She is walking 2 x a week at home and attending the class here. Her shortness of breath is improving. only bothers her when she adds incline. Her BP ranges 130-164/74-82 arrival and 120-130/56-74 after exercise.  systolic is lower on arrival now. She is trting to watch her sodium intake and has worked on Sport and exercise psychologist with goal to stop eating snacks. She has decreased her wine intake (which makes her hungry for snacks) to none on the week nights. She is adding protein to her meals and trting not to miss meals. She is compliant with all meds for heart failure and blood pressure control. Donnis reports she is able to perform her ADLs with less shortness of breath. She attributes this success to the exercise she has been doing here at rehab. Reminded her the importance of continuing to exercise once she has graduated from the program. She agrees on the importance of continuing her exercise and discussed plan to set herself up for success.      Expected Outcomes STG attend all scheduled sessions to continue exercise and education, continue home exercis, keep working on portion control and decreasing sancks while watching sodium intake.  LTG  able to have lost weight, improved shortness of breath with ADLs and manage nutrition plan after discharge STG: Continue to attend rehab but have exercise plan for after program. LTG: exercise independently and practive breahting techniques taught at pulmonary rehab.                Core Components/Risk Factors/Patient Goals at Discharge (Final Review):   Goals and Risk Factor Review - 08/16/23 1612       Core Components/Risk Factors/Patient Goals Review   Personal Goals Review Improve shortness of breath with ADL's    Review Shwanda reports she is able to perform her ADLs with less shortness of breath. She attributes this success to the exercise she has been doing here at rehab. Reminded her the importance of continuing to exercise once she has graduated from the program. She agrees on the importance of continuing her exercise and discussed plan to set herself up for success.    Expected Outcomes STG: Continue to attend rehab but have exercise plan for after program. LTG: exercise independently and practive breahting techniques taught at pulmonary rehab.             ITP Comments:  ITP Comments     Row Name 03/15/23 1304 04/05/23 1153 05/03/23 1014 05/31/23 1428 06/28/23 1138   ITP Comments 30 Day review completed. Medical Director ITP review done, changes made as directed, and signed approval by Medical Director.    new to program 30 Day review completed. Medical Director ITP review done, changes made as directed, and signed approval by Medical Director. 30 Day review completed. Medical Director ITP review done, changes made as directed, and signed approval by Medical Director. 30 Day review completed. Medical Director ITP review done, changes made as directed, and signed approval by Medical Director. 30 Day review completed. Medical Director ITP review done, changes made as directed, and signed approval by Medical Director.    Row Name 07/26/23 1041 08/23/23 0926         ITP Comments 30 Day review completed. Medical Director ITP review done, changes made as directed, and signed approval by Medical Director. 30 Day review completed. Medical Director ITP review done, changes made as directed, and signed approval by Medical  Director.                Comments:

## 2023-08-24 ENCOUNTER — Other Ambulatory Visit (HOSPITAL_COMMUNITY): Payer: Self-pay

## 2023-08-24 ENCOUNTER — Other Ambulatory Visit: Payer: Self-pay

## 2023-08-24 ENCOUNTER — Telehealth (HOSPITAL_COMMUNITY): Payer: Self-pay

## 2023-08-24 NOTE — Telephone Encounter (Signed)
 Advanced Heart Failure Patient Advocate Encounter  Prior authorization for Vyndamax has been submitted and approved. Test billing returns $0 for 30 day supply.  Key: [id number] Effective: 08/23/2023 to 08/22/2024  Burnell Blanks, CPhT Rx Patient Advocate Phone: [phone]

## 2023-08-28 ENCOUNTER — Encounter: Admitting: *Deleted

## 2023-08-28 DIAGNOSIS — I5032 Chronic diastolic (congestive) heart failure: Secondary | ICD-10-CM | POA: Diagnosis not present

## 2023-08-28 NOTE — Progress Notes (Signed)
 Discharge Note for  Lauren Lloyd     01-14-1941         Lauren Lloyd graduated today from  rehab with 36 sessions completed.  Details of the patient's exercise prescription and what She needs to do in order to continue the prescription and progress were discussed with patient.  Patient was given a copy of prescription and goals.  Patient verbalized understanding. Brenn plans to continue to exercise by walking at home     6 Minute Walk     Row Name 08/03/23 1558         6 Minute Walk   Phase Discharge     Distance 1235 feet     Distance % Change 30 %     Distance Feet Change 290 ft     Walk Time 6 minutes     # of Rest Breaks 0     MPH 2.34     METS 2.3     RPE 13     Perceived Dyspnea  2     VO2 Peak 8.04     Symptoms No     Resting HR 70 bpm     Resting BP 128/68     Resting Oxygen Saturation  97 %     Exercise Oxygen Saturation  during 6 min walk 94 %     Max Ex. HR 133 bpm     Max Ex. BP 162/72     2 Minute Post BP 142/70       Interval HR   1 Minute HR 85     2 Minute HR 107     3 Minute HR 112     4 Minute HR 126     5 Minute HR 128     6 Minute HR 133     2 Minute Post HR 72     Interval Heart Rate? Yes       Interval Oxygen   Interval Oxygen? Yes     Baseline Oxygen Saturation % 97 %     1 Minute Oxygen Saturation % 97 %     1 Minute Liters of Oxygen 0 L     2 Minute Oxygen Saturation % 96 %     2 Minute Liters of Oxygen 0 L     3 Minute Oxygen Saturation % 95 %     3 Minute Liters of Oxygen 0 L     4 Minute Oxygen Saturation % 94 %     4 Minute Liters of Oxygen 0 L     5 Minute Oxygen Saturation % 96 %     5 Minute Liters of Oxygen 0 L     6 Minute Oxygen Saturation % 96 %     6 Minute Liters of Oxygen 0 L     2 Minute Post Oxygen Saturation % 98 %     2 Minute Post Liters of Oxygen 0 L

## 2023-08-28 NOTE — Progress Notes (Signed)
 Daily Session Note  Patient Details  Name: Lauren Lloyd MRN: [id number] Date of Birth: 23-Apr-1941 Referring Provider:   Flowsheet Row Pulmonary Rehab from 02/13/2023 in [facility] Cardiac and Pulmonary Rehab  Referring Provider Belva Boyden, MD       Encounter Date: 08/28/2023  Check In:      Social History   Tobacco Use  Smoking Status Former   Current packs/day: 0.00   Average packs/day: 1 pack/day for 30.0 years (30.0 ttl pk-yrs)   Types: Cigarettes   Start date: 05/17/1959   Quit date: 05/16/1989   Years since quitting: 34.3   Passive exposure: Past  Smokeless Tobacco Never    Goals Met:  Independence with exercise equipment Exercise tolerated well No report of concerns or symptoms today  Goals Unmet:  Not Applicable  Comments: Pt able to follow exercise prescription today without complaint.  Will continue to monitor for progression.    Dr. Firman Hughes is Medical Director for [facility] Cardiac Rehabilitation.  Dr. Fuad Aleskerov is Medical Director for [facility] Pulmonary Rehabilitation.

## 2023-08-28 NOTE — Progress Notes (Signed)
 Pulmonary Individual Treatment Plan  Patient Details  Name: Lauren Lloyd MRN: [id number] Date of Birth: 04-Apr-1941 Referring Provider:   Flowsheet Row Pulmonary Rehab from 02/13/2023 in [facility] Cardiac and Pulmonary Rehab  Referring Provider Julien Nordmann, MD       Initial Encounter Date:  Flowsheet Row Pulmonary Rehab from 02/13/2023 in [facility] Cardiac and Pulmonary Rehab  Date 02/13/23       Visit Diagnosis: Heart failure, diastolic, chronic (HCC)  Patient's Home Medications on Admission:  Current Outpatient Medications:    allopurinol (ZYLOPRIM) 100 MG tablet, TAKE ONE TABLET BY MOUTH EVERY DAY, Disp: 90 tablet, Rfl: 1   calcium carbonate (TUMS EX) 750 MG chewable tablet, Chew 2 tablets by mouth daily as needed for heartburn., Disp: , Rfl:    Cholecalciferol 25 MCG (1000 UT) tablet, Take 1,000 Units by mouth daily., Disp: , Rfl:    dapagliflozin propanediol (FARXIGA) 10 MG TABS tablet, Take 1 tablet (10 mg total) by mouth daily before breakfast., Disp: 30 tablet, Rfl: 6   ELIQUIS 5 MG TABS tablet, TAKE ONE TABLET TWICE DAILY, Disp: 180 tablet, Rfl: 1   ezetimibe (ZETIA) 10 MG tablet, TAKE 1 TABLET BY MOUTH DAILY, Disp: 90 tablet, Rfl: 3   furosemide (LASIX) 40 MG tablet, TAKE 1 TABLET BY MOUTH DAILY. TAKE AN EXTRA TABLET AS NEEDED AFTER LUNCH FOR ABDOMINAL SWELLING, LEG SWELLING OR SHORTNESS OF BREATH, Disp: 180 tablet, Rfl: 1   gabapentin (NEURONTIN) 600 MG tablet, TAKE ONE TABLET BY MOUTH AT BEDTIME, Disp: 90 tablet, Rfl: 0   Magnesium 300 MG CAPS, Take by mouth., Disp: , Rfl:    Menthol, Topical Analgesic, (BIOFREEZE EX), Apply 1 application. topically daily as needed (Neck pain)., Disp: , Rfl:    metaxalone (SKELAXIN) 800 MG tablet, Take 1 tablet (800 mg total) by mouth daily as needed for muscle spasms., Disp: 30 tablet, Rfl: 2   Multiple Vitamin (MULTIVITAMIN) capsule, Take 1 capsule by mouth daily., Disp: , Rfl:    mupirocin ointment (BACTROBAN) 2 %, Place 1 application  into  the nose 2 (two) times daily., Disp: 22 g, Rfl: 0   potassium chloride (KLOR-CON) 10 MEQ tablet, TAKE 1 TABLET BY MOUTH DAILY, Disp: 30 tablet, Rfl: 5   Tafamidis (VYNDAMAX) 61 MG CAPS, Take 1 capsule (61 mg total) by mouth daily., Disp: 30 capsule, Rfl: 11   Tiotropium Bromide-Olodaterol (STIOLTO RESPIMAT) 2.5-2.5 MCG/ACT AERS, Inhale 2 puffs into the lungs daily., Disp: 4 g, Rfl: 0  Past Medical History: Past Medical History:  Diagnosis Date   (HFpEF) heart failure with preserved ejection fraction (HCC)    a. 05/2018 Echo: EF 55-60%, no rwma, mild to mod MR. Nl RV fxn. Mod TR. PASP .   Arthritis    knees, Hands   Arthritis of knee    Back pain    Carotid arterial disease (HCC)    a. 03/2019 Carotid U/S: <50% bilat ICA stenoses.   CHF (congestive heart failure) (HCC)    Cholelithiasis    a. 10/2018 noted on CT.   Edema, lower extremity    Fatty liver    GERD (gastroesophageal reflux disease)    History of stress test    a. 06/2018 MV: EF 59%, no ischemia/infarct. Low risk.   Hypertension    Knee pain    Lactose intolerance    Mitral regurgitation    a. 05/2018 Echo: mild to mod MR.   Multinodular goiter    Obesity    OSA (obstructive sleep apnea)  PAF (paroxysmal atrial fibrillation) (HCC)    a.  Diagnosed 12/19; b. 05/2018 s/p DCCV; c. 03/2019 & 05/2019 recurrent AFib-->managed w/ amio load; d. CHADS2VASc = 6 (CHF, HTN, age x 2, vascular disease, female)-->Eliquis & amio 100 qd.   PAH (pulmonary artery hypertension) (HCC)    RSV (acute bronchiolitis due to respiratory syncytial virus) 05/10/2022   Scoliosis    SOB (shortness of breath)    Swallowing difficulty     Tobacco Use: Social History   Tobacco Use  Smoking Status Former   Current packs/day: 0.00   Average packs/day: 1 pack/day for 30.0 years (30.0 ttl pk-yrs)   Types: Cigarettes   Start date: 05/17/1959   Quit date: 05/16/1989   Years since quitting: 34.3   Passive exposure: Past  Smokeless Tobacco Never     Labs: Review Flowsheet  More data exists      Latest Ref Rng & Units 12/09/2019 08/03/2020 08/16/2021 03/17/2022 10/28/2022  Labs for ITP Cardiac and Pulmonary Rehab  Cholestrol 100 - 199 mg/dL 161  096  CANCELED  045  176   LDL (calc) 0 - 99 mg/dL 409  811  - 93  914   HDL-C >39 mg/dL 73  65  CANCELED  52  54   Trlycerides 0 - 149 mg/dL 782  956  CANCELED  94  126   Hemoglobin A1c 4.8 - 5.6 % 5.5  - - 6.0  6.0      Pulmonary Assessment Scores:  Pulmonary Assessment Scores     Row Name 08/03/23 1606         ADL UCSD   ADL Phase Exit       mMRC Score   mMRC Score 2              UCSD: Self-administered rating of dyspnea associated with activities of daily living (ADLs) 6-point scale (0 = "not at all" to 5 = "maximal or unable to do because of breathlessness")  Scoring Scores range from 0 to 120.  Minimally important difference is 5 units  CAT: CAT can identify the health impairment of COPD patients and is better correlated with disease progression.  CAT has a scoring range of zero to 40. The CAT score is classified into four groups of low (less than 10), medium (10 - 20), high (21-30) and very high (31-40) based on the impact level of disease on health status. A CAT score over 10 suggests significant symptoms.  A worsening CAT score could be explained by an exacerbation, poor medication adherence, poor inhaler technique, or progression of COPD or comorbid conditions.  CAT MCID is 2 points  mMRC: mMRC (Modified Medical Research Council) Dyspnea Scale is used to assess the degree of baseline functional disability in patients of respiratory disease due to dyspnea. No minimal important difference is established. A decrease in score of 1 point or greater is considered a positive change.   Pulmonary Function Assessment:   Exercise Target Goals: Exercise Program Goal: Individual exercise prescription set using results from initial 6 min walk test and THRR while considering   patient's activity barriers and safety.   Exercise Prescription Goal: Initial exercise prescription builds to 30-45 minutes a day of aerobic activity, 2-3 days per week.  Home exercise guidelines will be given to patient during program as part of exercise prescription that the participant will acknowledge.  Education: Aerobic Exercise: - Group verbal and visual presentation on the components of exercise prescription. Introduces F.I.T.T principle from ACSM for exercise  prescriptions.  Reviews F.I.T.T. principles of aerobic exercise including progression. Written material given at graduation.   Education: Resistance Exercise: - Group verbal and visual presentation on the components of exercise prescription. Introduces F.I.T.T principle from ACSM for exercise prescriptions  Reviews F.I.T.T. principles of resistance exercise including progression. Written material given at graduation.    Education: Exercise & Equipment Safety: - Individual verbal instruction and demonstration of equipment use and safety with use of the equipment. Flowsheet Row Pulmonary Rehab from 04/27/2023 in [facility] Cardiac and Pulmonary Rehab  Date 02/13/23  Educator MB  Instruction Review Code 1- Verbalizes Understanding       Education: Exercise Physiology & General Exercise Guidelines: - Group verbal and written instruction with models to review the exercise physiology of the cardiovascular system and associated critical values. Provides general exercise guidelines with specific guidelines to those with heart or lung disease.  Flowsheet Row Pulmonary Rehab from 04/27/2023 in [facility] Cardiac and Pulmonary Rehab  Education need identified 02/13/23       Education: Flexibility, Balance, Mind/Body Relaxation: - Group verbal and visual presentation with interactive activity on the components of exercise prescription. Introduces F.I.T.T principle from ACSM for exercise prescriptions. Reviews F.I.T.T. principles of flexibility  and balance exercise training including progression. Also discusses the mind body connection.  Reviews various relaxation techniques to help reduce and manage stress (i.e. Deep breathing, progressive muscle relaxation, and visualization). Balance handout provided to take home. Written material given at graduation.   Activity Barriers & Risk Stratification:   6 Minute Walk:  6 Minute Walk     Row Name 08/03/23 1558         6 Minute Walk   Phase Discharge     Distance 1235 feet     Distance % Change 30 %     Distance Feet Change 290 ft     Walk Time 6 minutes     # of Rest Breaks 0     MPH 2.34     METS 2.3     RPE 13     Perceived Dyspnea  2     VO2 Peak 8.04     Symptoms No     Resting HR 70 bpm     Resting BP 128/68     Resting Oxygen Saturation  97 %     Exercise Oxygen Saturation  during 6 min walk 94 %     Max Ex. HR 133 bpm     Max Ex. BP 162/72     2 Minute Post BP 142/70       Interval HR   1 Minute HR 85     2 Minute HR 107     3 Minute HR 112     4 Minute HR 126     5 Minute HR 128     6 Minute HR 133     2 Minute Post HR 72     Interval Heart Rate? Yes       Interval Oxygen   Interval Oxygen? Yes     Baseline Oxygen Saturation % 97 %     1 Minute Oxygen Saturation % 97 %     1 Minute Liters of Oxygen 0 L     2 Minute Oxygen Saturation % 96 %     2 Minute Liters of Oxygen 0 L     3 Minute Oxygen Saturation % 95 %     3 Minute Liters of Oxygen 0 L  4 Minute Oxygen Saturation % 94 %     4 Minute Liters of Oxygen 0 L     5 Minute Oxygen Saturation % 96 %     5 Minute Liters of Oxygen 0 L     6 Minute Oxygen Saturation % 96 %     6 Minute Liters of Oxygen 0 L     2 Minute Post Oxygen Saturation % 98 %     2 Minute Post Liters of Oxygen 0 L             Oxygen Initial Assessment:   Oxygen Re-Evaluation:  Oxygen Re-Evaluation     Row Name 03/02/23 0802 04/04/23 0850 05/30/23 0814 06/08/23 0844 07/24/23 1620     Program Oxygen  Prescription   Program Oxygen Prescription None None None None None     Home Oxygen   Home Oxygen Device None None None None None   Sleep Oxygen Prescription CPAP CPAP CPAP CPAP CPAP   Liters per minute -- --  off and on --  off and on --  off and on --   Home Exercise Oxygen Prescription None None None None None   Home Resting Oxygen Prescription None None None None None   Compliance with Home Oxygen Use No No No No No     Goals/Expected Outcomes   Short Term Goals To learn and demonstrate proper pursed lip breathing techniques or other breathing techniques.  To learn and demonstrate proper pursed lip breathing techniques or other breathing techniques.  To learn and demonstrate proper pursed lip breathing techniques or other breathing techniques.  To learn and demonstrate proper pursed lip breathing techniques or other breathing techniques.  To learn and demonstrate proper use of respiratory medications   Long  Term Goals Exhibits proper breathing techniques, such as pursed lip breathing or other method taught during program session Exhibits proper breathing techniques, such as pursed lip breathing or other method taught during program session Exhibits proper breathing techniques, such as pursed lip breathing or other method taught during program session Exhibits proper breathing techniques, such as pursed lip breathing or other method taught during program session Exhibits proper breathing techniques, such as pursed lip breathing or other method taught during program session   Comments Informed patient how to perform the Pursed Lipped breathing technique. Told patient to Inhale through the nose and out the mouth with pursed lips to keep their airways open, help oxygenate them better, practice when at rest or doing strenuous activity. Patient Verbalizes understanding of technique and will work on and be reiterated during [facility]. Reviewed pursed lipped breathing with patient. She verbalized  understanding and will continue to work on it during rest and during exercise. Checked in with Johnella to see how her pursed lipped breathing is going with rest and exercise after the goal set last review. She has been adding it in more when she is noticing shortness of breath, such as walking from the parking lot to rehab. Discussed to practice it each time she remembers to do pursed lipped breathing to build the habit. Shelma is still working on her goal to use pursed lipped breathing at rest and exercise when she remembers and is short of breath. She has noticed she is only short of breath when walking and walking uphill especially. Vicki continues to work on her breathing techniques.  She states that she gets short of breath when she is going up inclines.  She moves slow and flat when she can  to prevent any shortness of breath   Goals/Expected Outcomes Short: use PLB with exertion. Long: use PLB on exertion proficiently and independently. Short: use pursed lipped breathing with rest and exercise. Long: use pursed lipped breathing independently with exercise. Short: Consistently use pursed lipped breathing with rest and exercise when she becomes short of breath. Long: use pursed lipped breathing independently with exercise and rest. Short: Consistently use pursed lipped breathing with rest and exercise. Long: use pursed lipped breathing independently with exercise and rest. STG continue to use breathing techniques, taking time to moveLTG manages breathing and decreases shortness of breath as needed    Row Name 08/16/23 1602             Program Oxygen Prescription   Program Oxygen Prescription None         Home Oxygen   Home Oxygen Device None       Sleep Oxygen Prescription CPAP       Home Exercise Oxygen Prescription None       Home Resting Oxygen Prescription None       Compliance with Home Oxygen Use No         Goals/Expected Outcomes   Short Term Goals To learn and demonstrate proper use of  respiratory medications       Long  Term Goals Exhibits proper breathing techniques, such as pursed lip breathing or other method taught during program session       Comments Claude is working on her PLB techniques. She reports she is able to walk more stairs before getting tired.       Goals/Expected Outcomes STG: continue to use breathing techniques. LTG: Manages breathing and decreases shortness of breath as needed                Oxygen Discharge (Final Oxygen Re-Evaluation):  Oxygen Re-Evaluation - 08/16/23 1602       Program Oxygen Prescription   Program Oxygen Prescription None      Home Oxygen   Home Oxygen Device None    Sleep Oxygen Prescription CPAP    Home Exercise Oxygen Prescription None    Home Resting Oxygen Prescription None    Compliance with Home Oxygen Use No      Goals/Expected Outcomes   Short Term Goals To learn and demonstrate proper use of respiratory medications    Long  Term Goals Exhibits proper breathing techniques, such as pursed lip breathing or other method taught during program session    Comments Daniesha is working on her PLB techniques. She reports she is able to walk more stairs before getting tired.    Goals/Expected Outcomes STG: continue to use breathing techniques. LTG: Manages breathing and decreases shortness of breath as needed             Initial Exercise Prescription:   Perform Capillary Blood Glucose checks as needed.  Exercise Prescription Changes:   Exercise Prescription Changes     Row Name 03/09/23 0800 03/22/23 1600 04/06/23 1500 04/17/23 1100 04/20/23 0700     Response to Exercise   Blood Pressure (Admit) 164/82 140/70 150/74 142/72 --   Blood Pressure (Exercise) 148/62 134/60 122/60 158/80 --   Blood Pressure (Exit) 120/58 128/56 126/64 144/70 --   Heart Rate (Admit) 72 bpm 75 bpm 91 bpm 79 bpm --   Heart Rate (Exercise) 123 bpm 128 bpm 91 bpm 93 bpm --   Heart Rate (Exit) 72 bpm 70 bpm 77 bpm 72 bpm --   Oxygen  Saturation (Admit) 99 % 99 % 91 % 100 % --   Oxygen Saturation (Exercise) 92 % 92 % 91 % 93 % --   Oxygen Saturation (Exit) 95 % 97 % 96 % 95 % --   Rating of Perceived Exertion (Exercise) 13 14 13 15  --   Perceived Dyspnea (Exercise) 2 1 0 0 --   Symptoms none none knee pain none --   Duration Progress to 30 minutes of  aerobic without signs/symptoms of physical distress Progress to 30 minutes of  aerobic without signs/symptoms of physical distress Progress to 30 minutes of  aerobic without signs/symptoms of physical distress Progress to 30 minutes of  aerobic without signs/symptoms of physical distress --   Intensity THRR unchanged THRR unchanged THRR unchanged THRR unchanged --     Progression   Progression Continue to progress workloads to maintain intensity without signs/symptoms of physical distress. Continue to progress workloads to maintain intensity without signs/symptoms of physical distress. Continue to progress workloads to maintain intensity without signs/symptoms of physical distress. Continue to progress workloads to maintain intensity without signs/symptoms of physical distress. --   Average METs 1.97 2.07 2.78 2.7 --     Resistance Training   Training Prescription Yes Yes Yes Yes --   Weight 5lb 5lb 5lb 5lb --   Reps 10-15 10-15 10-15 10-15 --     Interval Training   Interval Training No No No No --     Treadmill   MPH -- 1.4 -- 1.5 --   Grade -- 0 -- 0 --   Minutes -- 15 -- 15 --   METs -- 2.07 -- 2.15 --     Recumbant Bike   Level -- -- 1 1.5 --   Watts -- -- 14 14 --   Minutes -- -- 15 15 --   METs -- -- 2.45 2.45 --     NuStep   Level 2 2 3 4  --   Minutes 15 15 15 15  --   METs 2.1 2.6 3.1 3.6 --     Biostep-RELP   Level 1 1 -- 2 --   Minutes 15 15 -- 15 --   METs 2 2 -- -- --     Track   Laps 15  Hallway 17  hallway -- 17 --   Minutes 15 15 -- 15 --   METs 1.82 1.92 -- 1.92 --     Home Exercise Plan   Plans to continue exercise at -- -- -- -- Home  (comment)  Dilyn plans to try walking for 30 minutes twice a week, outside at home. She struggles walking for 15 minutes in the program but states she can break up the home ex. to 15 minute intervals.   Frequency -- -- -- -- Add 2 additional days to program exercise sessions.   Initial Home Exercises Provided -- -- -- -- 04/20/23     Oxygen   Maintain Oxygen Saturation 88% or higher 88% or higher 88% or higher 88% or higher 88% or higher    Row Name 05/04/23 1400 05/18/23 1600 05/29/23 1600 06/12/23 1700 06/28/23 1100     Response to Exercise   Blood Pressure (Admit) 148/80 128/68 128/68 162/78 140/62   Blood Pressure (Exit) 128/60 130/62 130/62 122/64 152/62   Heart Rate (Admit) 73 bpm 82 bpm 82 bpm 86 bpm 70 bpm   Heart Rate (Exercise) 91 bpm 91 bpm 91 bpm 78 bpm 99 bpm   Heart Rate (Exit) 80 bpm  90 bpm 90 bpm 81 bpm 78 bpm   Oxygen Saturation (Admit) 94 % 93 % 93 % 92 % 96 %   Oxygen Saturation (Exercise) 95 % 93 % 93 % 93 % 88 %   Oxygen Saturation (Exit) 95 % 96 % 96 % 95 % 95 %   Rating of Perceived Exertion (Exercise) 15 13 13 13 13    Perceived Dyspnea (Exercise) 3 0 0 1 1   Symptoms none none none none none   Duration Progress to 30 minutes of  aerobic without signs/symptoms of physical distress Progress to 30 minutes of  aerobic without signs/symptoms of physical distress Progress to 30 minutes of  aerobic without signs/symptoms of physical distress Continue with 30 min of aerobic exercise without signs/symptoms of physical distress. Continue with 30 min of aerobic exercise without signs/symptoms of physical distress.   Intensity THRR unchanged THRR unchanged THRR unchanged THRR unchanged THRR unchanged     Progression   Progression Continue to progress workloads to maintain intensity without signs/symptoms of physical distress. Continue to progress workloads to maintain intensity without signs/symptoms of physical distress. Continue to progress workloads to maintain intensity  without signs/symptoms of physical distress. Continue to progress workloads to maintain intensity without signs/symptoms of physical distress. Continue to progress workloads to maintain intensity without signs/symptoms of physical distress.   Average METs 2.34 2.74 2.74 3.18 2.8     Resistance Training   Training Prescription Yes Yes Yes Yes Yes   Weight 6 lb 6 lb 6 lb 6 lb 6 lb   Reps 10-15 10-15 10-15 10-15 10-15     Interval Training   Interval Training No No No No No     Treadmill   MPH 1.7 1.3 1.3 -- 1.9   Grade 0 0.5 0.5 -- 0.5   Minutes 15 15 15  -- 15   METs 2.3 2.08 2.08 -- 2.59     Recumbant Bike   Level 1 -- -- -- --   Watts 14 -- -- -- --   Minutes 15 -- -- -- --   METs 2.45 -- -- -- --     NuStep   Level 3 4 4 4 4    Minutes 15 15 15 30 30    METs 3.2 3.2 3.2 3.5 3.1     T5 Nustep   Level -- -- -- -- 3  T6   Minutes -- -- -- -- 15   METs -- -- -- -- 2.9     Biostep-RELP   Level 3 -- -- -- --   Minutes 15 -- -- -- --   METs 2 -- -- -- --     Track   Laps 17 -- -- -- --   Minutes 15 -- -- -- --   METs 1.92 -- -- -- --     Home Exercise Plan   Plans to continue exercise at Home (comment)  Guillermina plans to try walking for 30 minutes twice a week, outside at home. She struggles walking for 15 minutes in the program but states she can break up the home ex. to 15 minute intervals. Home (comment)  Dejanay plans to try walking for 30 minutes twice a week, outside at home. She struggles walking for 15 minutes in the program but states she can break up the home ex. to 15 minute intervals. Home (comment)  Mariaceleste plans to try walking for 30 minutes twice a week, outside at home. She struggles walking for 15 minutes in the program  but states she can break up the home ex. to 15 minute intervals. Home (comment)  Samanatha plans to try walking for 30 minutes twice a week, outside at home. She struggles walking for 15 minutes in the program but states she can break up the home ex. to 15  minute intervals. Home (comment)  Elania plans to try walking for 30 minutes twice a week, outside at home. She struggles walking for 15 minutes in the program but states she can break up the home ex. to 15 minute intervals.   Frequency Add 2 additional days to program exercise sessions. Add 2 additional days to program exercise sessions. Add 2 additional days to program exercise sessions. Add 2 additional days to program exercise sessions. Add 2 additional days to program exercise sessions.   Initial Home Exercises Provided 04/20/23 04/20/23 04/20/23 04/20/23 04/20/23     Oxygen   Maintain Oxygen Saturation 88% or higher 88% or higher 88% or higher 88% or higher 88% or higher    Row Name 07/11/23 1500 07/27/23 1800 08/09/23 0800 08/24/23 1700       Response to Exercise   Blood Pressure (Admit) 142/68 152/80 128/68 142/80    Blood Pressure (Exit) 130/60 144/70 118/64 122/72    Heart Rate (Admit) 73 bpm 69 bpm 70 bpm 76 bpm    Heart Rate (Exercise) 106 bpm 130 bpm 130 bpm 112 bpm    Heart Rate (Exit) 71 bpm 71 bpm 77 bpm 70 bpm    Oxygen Saturation (Admit) 97 % 96 % 97 % 97 %    Oxygen Saturation (Exercise) 91 % 93 % 93 % 91 %    Oxygen Saturation (Exit) 96 % 97 % 96 % 95 %    Rating of Perceived Exertion (Exercise) 15 13 15 13     Perceived Dyspnea (Exercise) 3 2 2 3     Symptoms none none none none    Duration Continue with 30 min of aerobic exercise without signs/symptoms of physical distress. Continue with 30 min of aerobic exercise without signs/symptoms of physical distress. Continue with 30 min of aerobic exercise without signs/symptoms of physical distress. Continue with 30 min of aerobic exercise without signs/symptoms of physical distress.    Intensity THRR unchanged THRR unchanged THRR unchanged THRR unchanged      Progression   Progression Continue to progress workloads to maintain intensity without signs/symptoms of physical distress. Continue to progress workloads to maintain  intensity without signs/symptoms of physical distress. Continue to progress workloads to maintain intensity without signs/symptoms of physical distress. Continue to progress workloads to maintain intensity without signs/symptoms of physical distress.    Average METs 2.11 2.64 2.4 2.76      Resistance Training   Training Prescription Yes Yes Yes Yes    Weight 6 lb 6 lb 6 lb 6 lb    Reps 10-15 10-15 10-15 10-15      Interval Training   Interval Training No No No No      Treadmill   MPH 1.5 -- -- --    Grade 0.5 -- -- --    Minutes 15 -- -- --    METs 2.25 -- -- --      T5 Nustep   Level 3 -- 3 --    Minutes 15 -- 15 --    METs 2 -- 2.1 --      Biostep-RELP   Level 2 1 1 1     Minutes 15 15 15 15     METs --  2 2 2       Track   Laps 20 42  Hall 42  hall 42  hall    Minutes 15 15 15 15     METs 2.09 3.28 3.28 3.28      Home Exercise Plan   Plans to continue exercise at Home (comment)  Agapita plans to try walking for 30 minutes twice a week, outside at home. She struggles walking for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 Continue to improve symptoms and manage a positive mental state.   Interventions Encouraged to  attend Pulmonary Rehabilitation for the exercise Encouraged to attend Pulmonary Rehabilitation for the exercise Encouraged to attend Pulmonary Rehabilitation for the exercise Encouraged to attend Pulmonary Rehabilitation for the exercise Encouraged to attend Pulmonary Rehabilitation for the exercise   Continue Psychosocial Services  Follow up required by staff Follow up required by staff Follow up required by staff Follow up required by staff Follow up required by staff    Row Name 07/24/23 1623 08/16/23 1607           Psychosocial Re-Evaluation   Current issues with -- Current Stress Concerns      Comments Erial is doing well.  She still has some stress with her dizziness , higher BP readings and her health in general. She is going tomorrow for some cardiac test"to see how her heart is pumping".  Her dizziness is improving some. Chantil reports she is doing well. Denies any stress or anxiety. She says she has been going on walks getting fresh air which has helped her stay mentally clear.      Expected Outcomes STG continue her exercise at a slow steady pace, stopping for any symptoms. LTG able to manage without her dizziness and any other symptoms STG: Continue to walk for exercise and mental clarity. LTG: achieve and maintain positive outlook on health and daily life      Interventions Encouraged to attend Pulmonary Rehabilitation for the exercise Encouraged to attend Pulmonary Rehabilitation for the exercise      Continue Psychosocial Services  Follow up required by staff Follow up required by staff               Psychosocial Discharge (Final Psychosocial Re-Evaluation):  Psychosocial Re-Evaluation - 08/16/23 1607       Psychosocial Re-Evaluation   Current issues with Current Stress Concerns    Comments Carlota reports she is doing well. Denies any stress or anxiety. She says she has been going on walks getting fresh air which has helped her stay mentally clear.    Expected Outcomes STG:  Continue to walk for exercise and mental clarity. LTG: achieve and maintain positive outlook on health and daily life    Interventions Encouraged to attend Pulmonary Rehabilitation for the exercise    Continue Psychosocial Services  Follow up required by staff             Education: Education Goals: Education classes will be provided on a weekly basis, covering required topics. Participant will state understanding/return demonstration of topics presented.  Learning Barriers/Preferences:   General Pulmonary Education Topics:  Infection Prevention: - Provides verbal and written material to individual with discussion of infection control including proper hand washing and proper equipment cleaning during exercise session. Flowsheet Row Pulmonary Rehab from 04/27/2023 in [facility]  Cardiac and Pulmonary Rehab  Date 02/13/23  Educator MB  Instruction Review Code 1- Verbalizes Understanding       Falls Prevention: - Provides verbal and written material to individual with discussion of falls prevention and safety. Flowsheet Row Pulmonary Rehab from 04/27/2023 in [facility] Cardiac and Pulmonary Rehab  Date 02/13/23  Educator MB  Instruction Review Code 1- Verbalizes Understanding       Chronic Lung Disease Review: - Group verbal instruction with posters, models, PowerPoint presentations and videos,  to review new updates, new respiratory medications, new advancements in procedures and treatments. Providing information on websites and "800" numbers for continued self-education. Includes information about supplement oxygen, available portable oxygen systems, continuous and intermittent flow rates, oxygen safety, concentrators, and Medicare reimbursement for oxygen. Explanation of Pulmonary Drugs, including class, frequency, complications, importance of spacers, rinsing mouth after steroid MDI's, and proper cleaning methods for nebulizers. Review of basic lung anatomy and physiology related to  function, structure, and complications of lung disease. Review of risk factors. Discussion about methods for diagnosing sleep apnea and types of masks and machines for OSA. Includes a review of the use of types of environmental controls: home humidity, furnaces, filters, dust mite/pet prevention, HEPA vacuums. Discussion about weather changes, air quality and the benefits of nasal washing. Instruction on Warning signs, infection symptoms, calling MD promptly, preventive modes, and value of vaccinations. Review of effective airway clearance, coughing and/or vibration techniques. Emphasizing that all should Create an Action Plan. Written material given at graduation. Flowsheet Row Pulmonary Rehab from 04/27/2023 in [facility] Cardiac and Pulmonary Rehab  Education need identified 02/13/23  Date 04/27/23  Educator [facility]  Instruction Review Code 1- Verbalizes Understanding       AED/CPR: - Group verbal and written instruction with the use of models to demonstrate the basic use of the AED with the basic ABC's of resuscitation.    Anatomy and Cardiac Procedures: - Group verbal and visual presentation and models provide information about basic cardiac anatomy and function. Reviews the testing methods done to diagnose heart disease and the outcomes of the test results. Describes the treatment choices: Medical Management, Angioplasty, or Coronary Bypass Surgery for treating various heart conditions including Myocardial Infarction, Angina, Valve Disease, and Cardiac Arrhythmias.  Written material given at graduation.   Medication Safety: - Group verbal and visual instruction to review commonly prescribed medications for heart and lung disease. Reviews the medication, class of the drug, and side effects. Includes the steps to properly store meds and maintain the prescription regimen.  Written material given at graduation.   Other: -Provides group and verbal instruction on various topics (see  comments)   Knowledge Questionnaire Score:    Core Components/Risk Factors/Patient Goals at Admission:   Education:Diabetes - Individual verbal and written instruction to review signs/symptoms of diabetes, desired ranges of glucose level fasting, after meals and with exercise. Acknowledge that pre and post exercise glucose checks will be done for 3 sessions at entry of program.   Know Your Numbers and Heart Failure: - Group verbal and visual instruction to discuss disease risk factors for cardiac and pulmonary disease and treatment options.  Reviews associated critical values for Overweight/Obesity, Hypertension, Cholesterol, and Diabetes.  Discusses basics of heart failure: signs/symptoms and treatments.  Introduces Heart Failure Zone chart for action plan for heart failure.  Written material given at graduation.   Core Components/Risk Factors/Patient Goals Review:   Goals and Risk Factor Review     Row Name 03/02/23 0805 04/04/23 0916 05/02/23  6045 05/30/23 0839 06/08/23 0854     Core Components/Risk Factors/Patient Goals Review   Personal Goals Review Improve shortness of breath with ADL's Improve shortness of breath with ADL's;Hypertension Weight Management/Obesity;Hypertension Weight Management/Obesity;Hypertension;Improve shortness of breath with ADL's Weight Management/Obesity;Hypertension;Improve shortness of breath with ADL's   Review Spoke to patient about their shortness of breath and what they can do to improve. Patient has been informed of breathing techniques when starting the program. Patient is informed to tell staff if they have had any med changes and that certain meds they are taking or not taking can be causing shortness of breath. Spoke with patient about their shortness of breath and she is seeing her pulmonologist today about her shortness of breath. She was also reminded of the breathing techniques when starting the program. Patient is not monitoring her blood pressure  at home with her cuff at home. She was informed to start checking at home. Patient states that she is still trying to lose some weight but with th eholidays it is more difficult. She is still working on maintaining her diet, and trying to implement home exercise. Patient also states that she knows that she needs to be taking her blood pressure, but is currently not. She has been informed on why taking her blood pressure is import, and has been encouraged to take it at least once a day on non-program days. Jaid has returned to the program after being out and states that with the holidays and the funeral events for her brother in law, it has been very difficult to work on her weight loss goal as she was surrounded by unhealthy food and has been busy with people in her house since 12/10. She has been working on implements home exercise and working her diet. She hopes to make more progress as the holiday events are over. She has been monitoring her blood pressure, motivated by her recent visit to the ED.She has been wroking on pursed lipped breathing to help improve her shortness of breath. Tanasha has not had any progress on her weight loss goal since I last checked in due to the cold and snowy weather causing her to eat more food/portions and not being able to exercise at home with the cold. She is monitoring her blood pressure a couple times a week, and states that she has been to the doctor a lot, which helps with monitoring blood pressure. She has noticed her SOB with walking, especially with uphill, and has been working on her pursed lipped breathing when she remembers to use it to improve SOB.   Expected Outcomes Short: Attend [facility] regularly to improve shortness of breath with ADL's. Long: maintain independence with ADL's Short: Attend [facility] regularly to improve shortness of breath with ADL's and check blood pressure at home. Long: maintain independence with ADL's and independence with monitoring blood  pressure at home. Short: Attend [facility] regularly to improve shortness of breath with ADL's and check blood pressure at home, and continue to practice good habits in order to lose weight. Long: maintain independence with ADL's and independence with monitoring blood pressure at home. Short: Attend [facility] consistently to improve shortness of breath with ADL's and continue to check blood pressure at home, and work on her healthy diet and home exercise in order to lose weight. Long: maintain independence with ADL's and monitoring blood pressure at home. Short: Continue to consistently attend [facility] to improve shortness of breath using pursed lipped breathing and continue to check blood pressure at  home, and work on her healthy diet and home exercise in order to lose weight. Long: maintain independence with ADL's and improving shortness of breath and monitoring blood pressure at home.    Row Name 07/24/23 1633 08/16/23 1612           Core Components/Risk Factors/Patient Goals Review   Personal Goals Review Weight Management/Obesity;Improve shortness of breath with ADL's;Hypertension;Heart Failure Improve shortness of breath with ADL's      Review Allaya has kept her weigh at 215 lbs, the weather has been a concern to be able to get out.  She did state to day that she has been able to go into [place] and walk all around without any symptoms. She could not do that a month ago. She is walking 2 x a week at home and attending the class here. Her shortness of breath is improving. only bothers her when she adds incline. Her BP ranges 130-164/74-82 arrival and 120-130/56-74 after exercise.  systolic is lower on arrival now. She is trting to watch her sodium intake and has worked on Sport and exercise psychologist with goal to stop eating snacks. She has decreased her wine intake (which makes her hungry for snacks) to none on the week nights. She is adding protein to her meals and trting not to miss meals. She is compliant with all meds  for heart failure and blood pressure control. Jakaiya reports she is able to perform her ADLs with less shortness of breath. She attributes this success to the exercise she has been doing here at rehab. Reminded her the importance of continuing to exercise once she has graduated from the program. She agrees on the importance of continuing her exercise and discussed plan to set herself up for success.      Expected Outcomes STG attend all scheduled sessions to continue exercise and education, continue home exercis, keep working on portion control and decreasing sancks while watching sodium intake.  LTG  able to have lost weight, improved shortness of breath with ADLs and manage nutrition plan after discharge STG: Continue to attend rehab but have exercise plan for after program. LTG: exercise independently and practive breahting techniques taught at pulmonary rehab.               Core Components/Risk Factors/Patient Goals at Discharge (Final Review):   Goals and Risk Factor Review - 08/16/23 1612       Core Components/Risk Factors/Patient Goals Review   Personal Goals Review Improve shortness of breath with ADL's    Review Anise reports she is able to perform her ADLs with less shortness of breath. She attributes this success to the exercise she has been doing here at rehab. Reminded her the importance of continuing to exercise once she has graduated from the program. She agrees on the importance of continuing her exercise and discussed plan to set herself up for success.    Expected Outcomes STG: Continue to attend rehab but have exercise plan for after program. LTG: exercise independently and practive breahting techniques taught at pulmonary rehab.             ITP Comments:  ITP Comments     Row Name 03/15/23 1304 04/05/23 1153 05/03/23 1014 05/31/23 1428 06/28/23 1138   ITP Comments 30 Day review completed. Medical Director ITP review done, changes made as directed, and signed approval by  Medical Director.    new to program 30 Day review completed. Medical Director ITP review done, changes made as directed, and signed approval by  Medical Director. 30 Day review completed. Medical Director ITP review done, changes made as directed, and signed approval by Medical Director. 30 Day review completed. Medical Director ITP review done, changes made as directed, and signed approval by Medical Director. 30 Day review completed. Medical Director ITP review done, changes made as directed, and signed approval by Medical Director.    Row Name 07/26/23 1041 08/23/23 0926         ITP Comments 30 Day review completed. Medical Director ITP review done, changes made as directed, and signed approval by Medical Director. 30 Day review completed. Medical Director ITP review done, changes made as directed, and signed approval by Medical Director.               Comments: Discharge ITP

## 2023-09-11 NOTE — Progress Notes (Signed)
 Remote pacemaker transmission.

## 2023-09-13 ENCOUNTER — Ambulatory Visit: Admitting: Pulmonary Disease

## 2023-09-13 ENCOUNTER — Encounter: Payer: Self-pay | Admitting: Cardiology

## 2023-09-19 ENCOUNTER — Other Ambulatory Visit: Payer: Self-pay

## 2023-09-19 ENCOUNTER — Other Ambulatory Visit (HOSPITAL_COMMUNITY): Payer: Self-pay

## 2023-09-19 NOTE — Progress Notes (Signed)
 Specialty Pharmacy Refill Coordination Note  Lauren Lloyd is a 83 y.o. female contacted today regarding refills of specialty medication(s) Tafamidis  (Vyndamax )   Patient requested Delivery   Delivery date: 09/25/23   Verified address: [address] [place] [address]   Medication will be filled on 09/22/23.

## 2023-09-22 ENCOUNTER — Ambulatory Visit: Payer: Self-pay | Admitting: Family Medicine

## 2023-09-22 ENCOUNTER — Other Ambulatory Visit: Payer: Self-pay

## 2023-09-22 NOTE — Telephone Encounter (Signed)
 Chief Complaint: Dizziness intermittent x5 months  Symptoms: Unsteady on feet, headache ongoing issue  Disposition:  [x] Appointment (In office/virtual)  Additional Notes: Patient states she was seen in office 3 months ago for dizziness. Pt states she is still having dizziness that makes her nervous she is going to fall when she walks around. Pt is interested in trying the "spin table to reset crystals." Pt states it is not worse but not going away. Pt scheduled for first available appointment with PCP in office. This RN educated pt on new-worsening symptoms and when to call back/seek emergent care. Pt verbalized understanding and agrees to plan.     Copied from CRM [id number]. Topic: Clinical - Red Word Triage >> Sep 22, 2023  2:17 PM Elle L wrote: Red Word that prompted transfer to Nurse Triage: The patient is having worsening issues with vertigo. She states she is walking around stumbling and her children are scared she will fall. Reason for Disposition  [1] MILD dizziness (e.g., vertigo; walking normally) AND [2] has been evaluated by doctor (or NP/PA) for this  Answer Assessment - Initial Assessment Questions DESCRIPTION: "Describe your dizziness."     Unsteady on feet, worse in morning VERTIGO: "Do you feel like either you or the room is spinning or tilting?"      Not sure SEVERITY: "How bad is it?"  "Can you walk?"   - MILD: Feels slightly dizzy and unsteady, but is walking normally.   - MODERATE: Feels unsteady when walking, but not falling; interferes with normal activities (e.g., school, work).   - SEVERE: Unable to walk without falling, or requires assistance to walk without falling.     Moderate to severe ONSET:  "When did the dizziness begin?"     5 months ago OTHER SYMPTOMS: "Do you have any other symptoms?" (e.g., headache, weakness, numbness, vomiting, earache)     Headache  Protocols used: Dizziness - Vertigo-A-AH

## 2023-09-25 NOTE — Telephone Encounter (Signed)
 Noted.

## 2023-09-29 ENCOUNTER — Other Ambulatory Visit: Payer: Self-pay | Admitting: Cardiovascular Disease

## 2023-10-12 ENCOUNTER — Ambulatory Visit: Admitting: Family Medicine

## 2023-10-16 ENCOUNTER — Other Ambulatory Visit: Payer: Self-pay | Admitting: Family Medicine

## 2023-10-17 ENCOUNTER — Other Ambulatory Visit: Payer: Self-pay

## 2023-10-17 ENCOUNTER — Telehealth: Payer: Self-pay

## 2023-10-17 DIAGNOSIS — K5792 Diverticulitis of intestine, part unspecified, without perforation or abscess without bleeding: Secondary | ICD-10-CM

## 2023-10-17 NOTE — Telephone Encounter (Signed)
 Order has been placed and sent to referral department

## 2023-10-17 NOTE — Telephone Encounter (Signed)
 Pt contacted office this morning to request an office visit for follow up on Diverticulitis.  She was advised by [facility] to follow up with Gastroenterologist after she has completed her 7 days antibiotics.  She has completed her antibiotics.  Explained to patient that we will need a referral for her, however due to office staff transitioning-our new providers will not be available until late July/Aug.  I offered to message her PCP to make her aware of the request to be seen by gastroenterologist for diverticulitis follow up.  She appreciated me offering to do so for her and asked if I would do so.  Message has been routed to patients PCP because [facility] is unable to provide a referral on her behalf but they did confirm that she was seen for Acute Diverticulitis but referral would need to come from PCP.  Please send referral for patient to [facility] for follow up acute diverticulitis.  Informed patient that I would follow up with her after I received feedback from her PCP.  Thanks,  [place], [place]

## 2023-10-20 ENCOUNTER — Other Ambulatory Visit: Payer: Self-pay

## 2023-10-23 ENCOUNTER — Other Ambulatory Visit: Payer: Self-pay

## 2023-10-24 ENCOUNTER — Other Ambulatory Visit: Payer: Self-pay

## 2023-10-25 ENCOUNTER — Other Ambulatory Visit: Payer: Self-pay

## 2023-10-25 ENCOUNTER — Ambulatory Visit (INDEPENDENT_AMBULATORY_CARE_PROVIDER_SITE_OTHER): Payer: Medicare HMO

## 2023-10-25 DIAGNOSIS — I495 Sick sinus syndrome: Secondary | ICD-10-CM | POA: Diagnosis not present

## 2023-10-25 NOTE — Progress Notes (Signed)
 Specialty Pharmacy Refill Coordination Note  Lauren Lloyd is a 83 y.o. female contacted today regarding refills of specialty medication(s) Tafamidis  (Vyndamax )   Patient requested Delivery   Delivery date: 10/26/23   Verified address: [address] [place] [address]   Medication will be filled on 10/25/23.

## 2023-10-26 ENCOUNTER — Other Ambulatory Visit (HOSPITAL_COMMUNITY): Payer: Self-pay

## 2023-10-26 LAB — CUP PACEART REMOTE DEVICE CHECK
Battery Remaining Longevity: 144 mo
Battery Remaining Percentage: 100 %
Brady Statistic RA Percent Paced: 0 %
Brady Statistic RV Percent Paced: 96 %
Date Time Interrogation Session: 20250611041100
Implantable Lead Connection Status: 753985
Implantable Lead Connection Status: 753985
Implantable Lead Implant Date: 20230612
Implantable Lead Implant Date: 20230612
Implantable Lead Location: 753859
Implantable Lead Location: 753860
Implantable Lead Model: 7841
Implantable Lead Model: 7842
Implantable Lead Serial Number: 1182863
Implantable Lead Serial Number: 1274488
Implantable Pulse Generator Implant Date: 20230612
Lead Channel Impedance Value: 529 Ohm
Lead Channel Impedance Value: 678 Ohm
Lead Channel Pacing Threshold Amplitude: 1 V
Lead Channel Pacing Threshold Pulse Width: 0.4 ms
Lead Channel Setting Pacing Amplitude: 1.3 V
Lead Channel Setting Pacing Pulse Width: 0.4 ms
Lead Channel Setting Sensing Sensitivity: 3.5 mV
Pulse Gen Serial Number: 111954
Zone Setting Status: 755011

## 2023-10-27 ENCOUNTER — Other Ambulatory Visit: Payer: Self-pay

## 2023-10-28 ENCOUNTER — Ambulatory Visit: Payer: Self-pay | Admitting: Cardiology

## 2023-10-28 ENCOUNTER — Other Ambulatory Visit (HOSPITAL_COMMUNITY): Payer: Self-pay

## 2023-10-30 ENCOUNTER — Ambulatory Visit: Payer: Self-pay

## 2023-10-30 DIAGNOSIS — R42 Dizziness and giddiness: Secondary | ICD-10-CM

## 2023-10-30 NOTE — Telephone Encounter (Signed)
Ok to send referral as requested 

## 2023-10-30 NOTE — Telephone Encounter (Signed)
 FYI Only or Action Required?: Action required by provider  Patient was last seen in primary care on 07/13/2023 by Mazie Speed, MD. Called Nurse Triage reporting Dizziness. Symptoms began several months ago. Interventions attempted: Rest, hydration, or home remedies. Symptoms are: rapidly worsening.  Triage Disposition: See PCP When Office is Open (Within 3 Days)  Patient/caregiver understands and will follow disposition?: Yes    Copied from CRM [id number]. Topic: Clinical - Red Word Triage >> Oct 30, 2023  4:26 PM Fonda T wrote: Red Word that prompted transfer to Nurse Triage: Dizziness, prone to falls Reason for Disposition  [1] MODERATE dizziness (e.g., vertigo; feels very unsteady, interferes with normal activities) AND [2] has been evaluated by doctor (or NP/PA) for this  Answer Assessment - Initial Assessment Questions 1. DESCRIPTION: Describe your dizziness.     Dizziness, spinning  2. VERTIGO: Do you feel like either you or the room is spinning or tilting?      yes 3. LIGHTHEADED: Do you feel lightheaded? (e.g., somewhat faint, woozy, weak upon standing)     Leaning over, sit up almost falls over 4. SEVERITY: How bad is it?  Can you walk?   - MILD: Feels slightly dizzy and unsteady, but is walking normally.   - MODERATE: Feels unsteady when walking, but not falling; interferes with normal activities (e.g., school, work).   - SEVERE: Unable to walk without falling, or requires assistance to walk without falling.     Get up too fast from sitting 5. ONSET:  When did the dizziness begin?     6 months ago 6. AGGRAVATING FACTORS: Does anything make it worse? (e.g., standing, change in head position)     Upon standing, leaning over  7. CAUSE: What do you think is causing the dizziness?     Pt states possibly her ear.  8. RECURRENT SYMPTOM: Have you had dizziness before? If Yes, ask: When was the last time? What happened that time?     Having dizzy spells  today 9. OTHER SYMPTOMS: Do you have any other symptoms? (e.g., headache, weakness, numbness, vomiting, earache)     Ear pain in R ear and today the L ear, every once in a while has headache, no sinus tension.   6 months her dizziness spells have been going on.  Pt states requesting referral to ENT, states there is a MD in [place] office, MD Virgina Grills. Pt states she has already scheduled the appointment for 11/02/2023 with an ENT. Pt states not wanting to make an appointment in office this week because it was not with Bacigalupo, if MD can get back to her just regarding the referral. Pt also advised Urgent care for worsening dizziness and risk for falls, pt states she has already been to an urgent care and called and her main concern for referral has not been addressed. Her best call back number is [phone].  Protocols used: Dizziness - Vertigo-A-AH

## 2023-10-31 NOTE — Addendum Note (Signed)
 Addended by: D[place] End on: 10/31/2023 08:19 AM   Modules accepted: Orders

## 2023-10-31 NOTE — Addendum Note (Signed)
 Addended by: Darrow End on: 10/31/2023 08:16 AM   Modules accepted: Orders

## 2023-11-02 ENCOUNTER — Other Ambulatory Visit (HOSPITAL_COMMUNITY): Payer: Self-pay

## 2023-11-02 ENCOUNTER — Inpatient Hospital Stay: Attending: Oncology

## 2023-11-02 DIAGNOSIS — R42 Dizziness and giddiness: Secondary | ICD-10-CM | POA: Diagnosis not present

## 2023-11-02 DIAGNOSIS — D472 Monoclonal gammopathy: Secondary | ICD-10-CM | POA: Insufficient documentation

## 2023-11-02 DIAGNOSIS — Z79899 Other long term (current) drug therapy: Secondary | ICD-10-CM | POA: Insufficient documentation

## 2023-11-02 DIAGNOSIS — M2669 Other specified disorders of temporomandibular joint: Secondary | ICD-10-CM | POA: Insufficient documentation

## 2023-11-02 DIAGNOSIS — H9201 Otalgia, right ear: Secondary | ICD-10-CM | POA: Diagnosis not present

## 2023-11-02 LAB — CBC (CANCER CENTER ONLY)
HCT: 44.4 % (ref 36.0–46.0)
Hemoglobin: 14.4 g/dL (ref 12.0–15.0)
MCH: 30.1 pg (ref 26.0–34.0)
MCHC: 32.4 g/dL (ref 30.0–36.0)
MCV: 92.9 fL (ref 80.0–100.0)
Platelet Count: 224 10*3/uL (ref 150–400)
RBC: 4.78 MIL/uL (ref 3.87–5.11)
RDW: 13.2 % (ref 11.5–15.5)
WBC Count: 7.8 10*3/uL (ref 4.0–10.5)
nRBC: 0 % (ref 0.0–0.2)

## 2023-11-02 LAB — BASIC METABOLIC PANEL - CANCER CENTER ONLY
Anion gap: 9 (ref 5–15)
BUN: 24 mg/dL — ABNORMAL HIGH (ref 8–23)
CO2: 23 mmol/L (ref 22–32)
Calcium: 9.2 mg/dL (ref 8.9–10.3)
Chloride: 106 mmol/L (ref 98–111)
Creatinine: 0.82 mg/dL (ref 0.44–1.00)
GFR, Estimated: >60 mL/min (ref >60)
Glucose, Bld: 98 mg/dL (ref 70–99)
Potassium: 3.9 mmol/L (ref 3.5–5.1)
Sodium: 138 mmol/L (ref 135–145)

## 2023-11-03 ENCOUNTER — Other Ambulatory Visit (HOSPITAL_COMMUNITY): Payer: Self-pay

## 2023-11-03 LAB — BETA 2 MICROGLOBULIN, SERUM: Beta-2 Microglobulin: 2.3 mg/L (ref 0.6–2.4)

## 2023-11-03 LAB — KAPPA/LAMBDA LIGHT CHAINS
Kappa free light chain: 27.1 mg/L — ABNORMAL HIGH (ref 3.3–19.4)
Kappa, lambda light chain ratio: 0.95 (ref 0.26–1.65)
Lambda free light chains: 28.5 mg/L — ABNORMAL HIGH (ref 5.7–26.3)

## 2023-11-04 LAB — IGG, IGA, IGM
IgA: 324 mg/dL (ref 64–422)
IgG (Immunoglobin G), Serum: 967 mg/dL (ref 586–1602)
IgM (Immunoglobulin M), Srm: 270 mg/dL — ABNORMAL HIGH (ref 26–217)

## 2023-11-06 DIAGNOSIS — R42 Dizziness and giddiness: Secondary | ICD-10-CM | POA: Diagnosis not present

## 2023-11-06 LAB — PROTEIN ELECTROPHORESIS, SERUM
A/G Ratio: 1.1 (ref 0.7–1.7)
Albumin ELP: 3.4 g/dL (ref 2.9–4.4)
Alpha-1-Globulin: 0.2 g/dL (ref 0.0–0.4)
Alpha-2-Globulin: 0.6 g/dL (ref 0.4–1.0)
Beta Globulin: 1.1 g/dL (ref 0.7–1.3)
Gamma Globulin: 1.1 g/dL (ref 0.4–1.8)
Globulin, Total: 3.1 g/dL (ref 2.2–3.9)
M-Spike, %: 0.3 g/dL — ABNORMAL HIGH
Total Protein ELP: 6.5 g/dL (ref 6.0–8.5)

## 2023-11-07 ENCOUNTER — Other Ambulatory Visit (HOSPITAL_COMMUNITY): Payer: Self-pay

## 2023-11-07 DIAGNOSIS — K080 Exfoliation of teeth due to systemic causes: Secondary | ICD-10-CM | POA: Diagnosis not present

## 2023-11-08 ENCOUNTER — Encounter: Payer: Self-pay | Admitting: Cardiology

## 2023-11-08 ENCOUNTER — Other Ambulatory Visit (HOSPITAL_COMMUNITY): Payer: Self-pay

## 2023-11-08 ENCOUNTER — Ambulatory Visit: Attending: Cardiology | Admitting: Cardiology

## 2023-11-08 VITALS — BP 144/68 | HR 71 | Ht 67.0 in | Wt 214.8 lb

## 2023-11-08 DIAGNOSIS — I10 Essential (primary) hypertension: Secondary | ICD-10-CM

## 2023-11-08 DIAGNOSIS — G4733 Obstructive sleep apnea (adult) (pediatric): Secondary | ICD-10-CM

## 2023-11-08 NOTE — Patient Instructions (Signed)

## 2023-11-08 NOTE — Progress Notes (Signed)
 Sleep Medicine  Note    Date:  11/08/2023   ID:  Lauren Lloyd, Lauren Lloyd 07-28-1940, MRN [id number]  PCP:  Myrla Jon HERO, MD  Cardiologist: Evalene Lunger, MD   Chief Complaint  Patient presents with   Sleep Apnea    History of Present Illness:  Lauren Lloyd is a 83 y.o. female  with a history of HFpEF, carotid artery disease, GERD, paroxysmal atrial fibrillation and pulmonary hypertension.  She was diagnosed with obstructive sleep apnea in 2020 after performing an NPSG which showed mild obstructive sleep apnea with an AHI of 11/h with moderate snoring and nocturnal hypoxemia.  She underwent CPAP titration and was started on CPAP at 16 cm H2O and ultimately was placed on auto CPAP from 4 to 20 cm H2O.  She is doing well with her PAP device and thinks that she has gotten used to it.  She has not been using it a lot because she has been traveling. She tolerates the full face mask and feels the pressure is adequate.  Since going on PAP she feels rested in the am and has no significant daytime sleepiness.  She denies any significant mouth or nasal dryness or nasal congestion.    Past Medical History:  Diagnosis Date   (HFpEF) heart failure with preserved ejection fraction (HCC)    a. 05/2018 Echo: EF 55-60%, no rwma, mild to mod MR. Nl RV fxn. Mod TR. PASP .   Arthritis    knees, Hands   Arthritis of knee    Back pain    Carotid arterial disease (HCC)    a. 03/2019 Carotid U/S: <50% bilat ICA stenoses.   CHF (congestive heart failure) (HCC)    Cholelithiasis    a. 10/2018 noted on CT.   Edema, lower extremity    Fatty liver    GERD (gastroesophageal reflux disease)    History of stress test    a. 06/2018 MV: EF 59%, no ischemia/infarct. Low risk.   Hypertension    Knee pain    Lactose intolerance    Mitral regurgitation    a. 05/2018 Echo: mild to mod MR.   Multinodular goiter    Obesity    OSA (obstructive sleep apnea)    PAF (paroxysmal atrial fibrillation) (HCC)     a.  Diagnosed 12/19; b. 05/2018 s/p DCCV; c. 03/2019 & 05/2019 recurrent AFib-->managed w/ amio load; d. CHADS2VASc = 6 (CHF, HTN, age x 2, vascular disease, female)-->Eliquis  & amio 100 qd.   PAH (pulmonary artery hypertension) (HCC)    RSV (acute bronchiolitis due to respiratory syncytial virus) 05/10/2022   Scoliosis    SOB (shortness of breath)    Swallowing difficulty     Past Surgical History:  Procedure Laterality Date   AV NODE ABLATION N/A 07/05/2022   Procedure: AV NODE ABLATION;  Surgeon: Cindie Ole DASEN, MD;  [facility]: [facility];  Service: Cardiovascular;  Laterality: N/A;   CARDIOVERSION N/A 06/15/2018   Procedure: CARDIOVERSION (CATH LAB);  Surgeon: Lunger Evalene PARAS, MD;  Location: [facility];  Service: Cardiovascular;  Laterality: N/A;   CARDIOVERSION N/A 02/18/2021   Procedure: CARDIOVERSION;  Surgeon: Lunger Evalene PARAS, MD;  Location: [facility];  Service: Cardiovascular;  Laterality: N/A;   CATARACT EXTRACTION W/PHACO Right 01/25/2016   Procedure: CATARACT EXTRACTION PHACO AND INTRAOCULAR LENS PLACEMENT (IOC);  Surgeon: Donzell Arlyce Budd, MD;  Location: [facility];  Service: Ophthalmology;  Laterality: Right;  RIGHT   CATARACT EXTRACTION W/PHACO  Left 02/22/2016   Procedure: CATARACT EXTRACTION PHACO AND INTRAOCULAR LENS PLACEMENT (IOC);  Surgeon: Donzell Arlyce Budd, MD;  Location: [facility];  Service: Ophthalmology;  Laterality: Left;  LEFT   HAMMER TOE SURGERY  05/16/2008   KNEE ARTHROSCOPY Right 05/16/2002   PACEMAKER IMPLANT N/A 10/25/2021   Procedure: PACEMAKER IMPLANT;  Surgeon: Cindie Ole DASEN, MD;  Location: [facility];  Service: Cardiovascular;  Laterality: N/A;   PARTIAL HIP ARTHROPLASTY Left 12/17/2021   REPLACEMENT TOTAL KNEE Right 05/17/2007   [facility]   SKIN GRAFT Left 04/08/2013   Done on left index finger   TONSILLECTOMY  05/16/1944   TOTAL KNEE ARTHROPLASTY Left 12/02/2020     Current Medications: Current Meds  Medication Sig   allopurinol  (ZYLOPRIM ) 100 MG tablet TAKE ONE TABLET BY MOUTH EVERY DAY   calcium  carbonate (TUMS EX) 750 MG chewable tablet Chew 2 tablets by mouth daily as needed for heartburn.   Cholecalciferol  25 MCG (1000 UT) tablet Take 1,000 Units by mouth daily.   dapagliflozin  propanediol (FARXIGA ) 10 MG TABS tablet Take 1 tablet (10 mg total) by mouth daily before breakfast.   ELIQUIS  5 MG TABS tablet TAKE ONE TABLET TWICE DAILY   ezetimibe  (ZETIA ) 10 MG tablet TAKE 1 TABLET BY MOUTH DAILY   furosemide  (LASIX ) 40 MG tablet TAKE 1 TABLET BY MOUTH DAILY. TAKE AN EXTRA TABLET AS NEEDED AFTER LUNCH FOR ABDOMINAL SWELLING, LEG SWELLING OR SHORTNESS OF BREATH   gabapentin  (NEURONTIN ) 600 MG tablet TAKE 1 TABLET BY MOUTH AT BEDTIME   Magnesium  300 MG CAPS Take by mouth.   Menthol, Topical Analgesic, (BIOFREEZE EX) Apply 1 application. topically daily as needed (Neck pain).   metaxalone  (SKELAXIN ) 800 MG tablet Take 1 tablet (800 mg total) by mouth daily as needed for muscle spasms.   Multiple Vitamin (MULTIVITAMIN) capsule Take 1 capsule by mouth daily.   mupirocin  ointment (BACTROBAN ) 2 % Place 1 application  into the nose 2 (two) times daily.   potassium chloride  (KLOR-CON ) 10 MEQ tablet TAKE 1 TABLET BY MOUTH DAILY   Tafamidis  (VYNDAMAX ) 61 MG CAPS Take 1 capsule (61 mg total) by mouth daily.   Tiotropium Bromide -Olodaterol (STIOLTO RESPIMAT ) 2.5-2.5 MCG/ACT AERS Inhale 2 puffs into the lungs daily.    Allergies:   Levofloxacin, Influenza vaccine recombinant, Influenza vaccines, and Oysters [shellfish allergy]   Social History   Socioeconomic History   Marital status: Single    Spouse name: Not on file   Number of children: 5   Years of education: college   Highest education level: Bachelor's degree (e.g., BA, AB, BS)  Occupational History   Occupation: Lawyer: [employer]  Tobacco Use   Smoking  status: Former    Current packs/day: 0.00    Average packs/day: 1 pack/day for 30.0 years (30.0 ttl pk-yrs)    Types: Cigarettes    Start date: 05/17/1959    Quit date: 05/16/1989    Years since quitting: 34.5    Passive exposure: Past   Smokeless tobacco: Never  Vaping Use   Vaping status: Never Used  Substance and Sexual Activity   Alcohol use: Yes    Alcohol/week: 14.0 standard drinks of alcohol    Types: 14 Glasses of wine per week   Drug use: No   Sexual activity: Not Currently  Other Topics Concern   Not on file  Social History Narrative   Pt has a child who passed away at  age 6   Social Drivers of Health   Financial Resource Strain: Low Risk  (01/11/2023)   Overall Financial Resource Strain (CARDIA)    Difficulty of Paying Living Expenses: Not hard at all  Food Insecurity: No Food Insecurity (08/03/2023)   Hunger Vital Sign    Worried About Running Out of Food in the Last Year: Never true    Ran Out of Food in the Last Year: Never true  Transportation Needs: No Transportation Needs (01/11/2023)   PRAPARE - Administrator, Civil Service (Medical): No    Lack of Transportation (Non-Medical): No  Physical Activity: Insufficiently Active (01/11/2023)   Exercise Vital Sign    Days of Exercise per Week: 2 days    Minutes of Exercise per Session: 10 min  Stress: No Stress Concern Present (01/11/2023)   [employer] of Occupational Health - Occupational Stress Questionnaire    Feeling of Stress : Not at all  Social Connections: Moderately Integrated (01/11/2023)   Social Connection and Isolation Panel    Frequency of Communication with Friends and Family: More than three times a week    Frequency of Social Gatherings with Friends and Family: More than three times a week    Attends Religious Services: More than 4 times per year    Active Member of [employer] or Organizations: Yes    Attends Engineer, structural: More than 4 times per year    Marital Status:  Divorced     Family History:  [name] family history includes Atrial fibrillation in her sister and sister; Breast cancer (age of onset: 42) in her sister; Healthy in her brother; Heart attack in her father; Heart disease in her mother; High blood pressure in her father; Hyperlipidemia in her sister and sister; Stroke in her father; Transient ischemic attack in her mother.   ROS:   Please see the history of present illness.    ROS All other systems reviewed and are negative.      No data to display             PHYSICAL EXAM:   VS:  BP (!) 144/68   Pulse 71   Ht 5' 7 (1.702 m)   Wt 214 lb 12.8 oz (97.4 kg)   SpO2 98%   BMI 33.64 kg/m    GEN: Well nourished, well developed, in no acute distress  HEENT: normal  Neck: no JVD, carotid bruits, or masses Cardiac: RRR; no murmurs, rubs, or gallops,no edema.  Intact distal pulses bilaterally.  Respiratory:  clear to auscultation bilaterally, normal work of breathing GI: soft, nontender, nondistended, + BS MS: no deformity or atrophy  Skin: warm and dry, no rash Neuro:  Alert and Oriented x 3, Strength and sensation are intact Psych: euthymic mood, full affect  Wt Readings from Last 3 Encounters:  11/08/23 214 lb 12.8 oz (97.4 kg)  08/03/23 215 lb (97.5 kg)  08/03/23 215 lb 12.8 oz (97.9 kg)      Studies/Labs Reviewed:   PSG, CPAP titration and Pap compliance download  Recent Labs: 07/09/2023: ALT 16 11/02/2023: BUN 24; Creatinine 0.82; Hemoglobin 14.4; Platelet Count 224; Potassium 3.9; Sodium 138     ASSESSMENT:    1. OSA (obstructive sleep apnea)   2. Essential hypertension       PLAN:  In order of problems listed above:  OSA - The patient is tolerating PAP therapy well without any problems. The PAP download performed by his DME was personally reviewed  and interpreted by me today and showed an AHI of 1.7 /hr on auto CPAP 4-20 cm H2O with 50 % compliance in using more than 4 hours nightly.  The patient  has been using and benefiting from PAP use and will continue to benefit from therapy.  - I encouraged her to be more compliant with her CPAP  Hypertension - BP borderline controlled on exam today - Currently not on any hypertensive medications   Time Spent: 20 minutes total time of encounter, including 15 minutes spent in face-to-face patient care on the date of this encounter. This time includes coordination of care and counseling regarding above mentioned problem list. Remainder of non-face-to-face time involved reviewing chart documents/testing relevant to the patient encounter and documentation in the medical record. I have independently reviewed documentation from referring provider  Medication Adjustments/Labs and Tests Ordered: Current medicines are reviewed at length with the patient today.  Concerns regarding medicines are outlined above.  Medication changes, Labs and Tests ordered today are listed in the Patient Instructions below.  There are no Patient Instructions on file for this visit.   Signed, Wilbert Bihari, MD  11/08/2023 11:57 AM    [facility] [address] [place], [place], [place]  [address] Phone: [phone]; Fax: [phone]

## 2023-11-09 ENCOUNTER — Inpatient Hospital Stay: Admitting: Oncology

## 2023-11-09 ENCOUNTER — Other Ambulatory Visit (HOSPITAL_COMMUNITY): Payer: Self-pay

## 2023-11-10 ENCOUNTER — Encounter: Payer: Self-pay | Admitting: Oncology

## 2023-11-10 ENCOUNTER — Inpatient Hospital Stay: Admitting: Oncology

## 2023-11-10 VITALS — BP 164/71 | HR 70 | Temp 98.1°F | Resp 18 | Ht 67.0 in | Wt 216.0 lb

## 2023-11-10 DIAGNOSIS — D472 Monoclonal gammopathy: Secondary | ICD-10-CM

## 2023-11-10 DIAGNOSIS — Z79899 Other long term (current) drug therapy: Secondary | ICD-10-CM | POA: Diagnosis not present

## 2023-11-10 NOTE — Progress Notes (Signed)
 [facility]  Telephone:([phone]) [phone] [phone]  ID: Lauren Lloyd OB: 07-16-1940  MR#: [id number]  RDW#:[id number]  Patient Care Team: Myrla Jon HERO, MD as PCP - General (Family Medicine) Perla Evalene PARAS, MD as PCP - Cardiology (Cardiology) Cindie Ole DASEN, MD as PCP - Electrophysiology (Cardiology) Avanell Katz, MD as Referring Physician (Physical Medicine and Rehabilitation) Pa, [facility] Picking, Halina, MD as Consulting Physician (Pulmonary Disease) Dasher, Alm LABOR, MD (Dermatology) Midge Sober, DO as Referring Physician (Family Medicine) Jacobo Evalene PARAS, MD as Consulting Physician (Hematology and Oncology)  CHIEF COMPLAINT: MGUS.  INTERVAL HISTORY: Patient returns to clinic today for repeat laboratory work and further evaluation.  She continues to have chronic dizziness and has been seen by ENT for evaluation.  She otherwise feels well.  She has no other neurologic complaints.  She denies any recent fevers or illnesses.  She has a good appetite and denies weight loss.  She has no chest pain, shortness of breath, cough, or hemoptysis.  She denies any nausea, vomiting, constipation, or diarrhea.  She has no urinary complaints.  Patient offers no further specific complaints today.   REVIEW OF SYSTEMS:   Review of Systems  Constitutional: Negative.  Negative for fever, malaise/fatigue and weight loss.  Respiratory: Negative.  Negative for cough, hemoptysis and shortness of breath.   Cardiovascular: Negative.  Negative for chest pain and leg swelling.  Gastrointestinal: Negative.  Negative for abdominal pain.  Genitourinary: Negative.  Negative for dysuria.  Musculoskeletal: Negative.  Negative for back pain.  Skin: Negative.  Negative for rash.  Neurological:  Positive for dizziness. Negative for focal weakness, weakness and headaches.  Psychiatric/Behavioral: Negative.  The patient is not nervous/anxious.     As  per HPI. Otherwise, a complete review of systems is negative.  PAST MEDICAL HISTORY: Past Medical History:  Diagnosis Date   (HFpEF) heart failure with preserved ejection fraction (HCC)    a. 05/2018 Echo: EF 55-60%, no rwma, mild to mod MR. Nl RV fxn. Mod TR. PASP .   Arthritis    knees, Hands   Arthritis of knee    Back pain    Carotid arterial disease (HCC)    a. 03/2019 Carotid U/S: <50% bilat ICA stenoses.   CHF (congestive heart failure) (HCC)    Cholelithiasis    a. 10/2018 noted on CT.   Edema, lower extremity    Fatty liver    GERD (gastroesophageal reflux disease)    History of stress test    a. 06/2018 MV: EF 59%, no ischemia/infarct. Low risk.   Hypertension    Knee pain    Lactose intolerance    Mitral regurgitation    a. 05/2018 Echo: mild to mod MR.   Multinodular goiter    Obesity    OSA (obstructive sleep apnea)    PAF (paroxysmal atrial fibrillation) (HCC)    a.  Diagnosed 12/19; b. 05/2018 s/p DCCV; c. 03/2019 & 05/2019 recurrent AFib-->managed w/ amio load; d. CHADS2VASc = 6 (CHF, HTN, age x 2, vascular disease, female)-->Eliquis  & amio 100 qd.   PAH (pulmonary artery hypertension) (HCC)    RSV (acute bronchiolitis due to respiratory syncytial virus) 05/10/2022   Scoliosis    SOB (shortness of breath)    Swallowing difficulty     PAST SURGICAL HISTORY: Past Surgical History:  Procedure Laterality Date   AV NODE ABLATION N/A 07/05/2022   Procedure: AV NODE ABLATION;  Surgeon: Cindie Ole DASEN, MD;  Location: [facility]  INVASIVE CV LAB;  Service: Cardiovascular;  Laterality: N/A;   CARDIOVERSION N/A 06/15/2018   Procedure: CARDIOVERSION (CATH LAB);  Surgeon: Perla Evalene PARAS, MD;  Location: [facility];  Service: Cardiovascular;  Laterality: N/A;   CARDIOVERSION N/A 02/18/2021   Procedure: CARDIOVERSION;  Surgeon: Perla Evalene PARAS, MD;  Location: [facility];  Service: Cardiovascular;  Laterality: N/A;   CATARACT EXTRACTION W/PHACO Right 01/25/2016   Procedure:  CATARACT EXTRACTION PHACO AND INTRAOCULAR LENS PLACEMENT (IOC);  Surgeon: Donzell Arlyce Budd, MD;  Location: [facility];  Service: Ophthalmology;  Laterality: Right;  RIGHT   CATARACT EXTRACTION W/PHACO Left 02/22/2016   Procedure: CATARACT EXTRACTION PHACO AND INTRAOCULAR LENS PLACEMENT (IOC);  Surgeon: Donzell Arlyce Budd, MD;  Location: [facility];  Service: Ophthalmology;  Laterality: Left;  LEFT   HAMMER TOE SURGERY  05/16/2008   KNEE ARTHROSCOPY Right 05/16/2002   PACEMAKER IMPLANT N/A 10/25/2021   Procedure: PACEMAKER IMPLANT;  Surgeon: Cindie Ole DASEN, MD;  Location: [facility];  Service: Cardiovascular;  Laterality: N/A;   PARTIAL HIP ARTHROPLASTY Left 12/17/2021   REPLACEMENT TOTAL KNEE Right 05/17/2007   [facility]   SKIN GRAFT Left 04/08/2013   Done on left index finger   TONSILLECTOMY  05/16/1944   TOTAL KNEE ARTHROPLASTY Left 12/02/2020    FAMILY HISTORY: Family History  Problem Relation Age of Onset   Hyperlipidemia Sister    Atrial fibrillation Sister    Transient ischemic attack Mother    Heart disease Mother    Heart attack Father    High blood pressure Father    Stroke Father    Healthy Brother    Breast cancer Sister 16   Hyperlipidemia Sister    Atrial fibrillation Sister     ADVANCED DIRECTIVES (Y/N):  N  HEALTH MAINTENANCE: Social History   Tobacco Use   Smoking status: Former    Current packs/day: 0.00    Average packs/day: 1 pack/day for 30.0 years (30.0 ttl pk-yrs)    Types: Cigarettes    Start date: 05/17/1959    Quit date: 05/16/1989    Years since quitting: 34.5    Passive exposure: Past   Smokeless tobacco: Never  Vaping Use   Vaping status: Never Used  Substance Use Topics   Alcohol use: Yes    Alcohol/week: 14.0 standard drinks of alcohol    Types: 14 Glasses of wine per week   Drug use: No     Colonoscopy:  PAP:  Bone density:  Lipid panel:  Allergies  Allergen  Reactions   Levofloxacin Other (See Comments)    Other reaction(s): Joint Pains   Influenza Vaccine Recombinant Other (See Comments)   Influenza Vaccines Other (See Comments)    Bell's Palsy   Oysters [Shellfish Allergy] Swelling    She states she had eaten them three days in a row and she developed swelling around her eyes.     Current Outpatient Medications  Medication Sig Dispense Refill   allopurinol  (ZYLOPRIM ) 100 MG tablet TAKE ONE TABLET BY MOUTH EVERY DAY 90 tablet 1   calcium  carbonate (TUMS EX) 750 MG chewable tablet Chew 2 tablets by mouth daily as needed for heartburn.     Cholecalciferol  25 MCG (1000 UT) tablet Take 1,000 Units by mouth daily.     dapagliflozin  propanediol (FARXIGA ) 10 MG TABS tablet Take 1 tablet (10 mg total) by mouth daily before breakfast. 30 tablet 6   ELIQUIS  5 MG TABS tablet TAKE ONE TABLET TWICE DAILY 180  tablet 1   ezetimibe  (ZETIA ) 10 MG tablet TAKE 1 TABLET BY MOUTH DAILY 90 tablet 3   furosemide  (LASIX ) 40 MG tablet TAKE 1 TABLET BY MOUTH DAILY. TAKE AN EXTRA TABLET AS NEEDED AFTER LUNCH FOR ABDOMINAL SWELLING, LEG SWELLING OR SHORTNESS OF BREATH 180 tablet 1   gabapentin  (NEURONTIN ) 600 MG tablet TAKE 1 TABLET BY MOUTH AT BEDTIME 90 tablet 0   Magnesium  300 MG CAPS Take by mouth.     Menthol, Topical Analgesic, (BIOFREEZE EX) Apply 1 application. topically daily as needed (Neck pain).     metaxalone  (SKELAXIN ) 800 MG tablet Take 1 tablet (800 mg total) by mouth daily as needed for muscle spasms. 30 tablet 2   Multiple Vitamin (MULTIVITAMIN) capsule Take 1 capsule by mouth daily.     mupirocin  ointment (BACTROBAN ) 2 % Place 1 application  into the nose 2 (two) times daily. 22 g 0   potassium chloride  (KLOR-CON ) 10 MEQ tablet TAKE 1 TABLET BY MOUTH DAILY 30 tablet 5   Tafamidis  (VYNDAMAX ) 61 MG CAPS Take 1 capsule (61 mg total) by mouth daily. 30 capsule 11   Tiotropium Bromide -Olodaterol (STIOLTO RESPIMAT ) 2.5-2.5 MCG/ACT AERS Inhale 2 puffs into  the lungs daily. 4 g 0   No current facility-administered medications for this visit.    OBJECTIVE: Vitals:   11/10/23 1026  BP: (!) 164/71  Pulse: 70  Resp: 18  Temp: 98.1 F (36.7 C)  SpO2: 95%     Body mass index is 33.83 kg/m.    ECOG FS:0 - Asymptomatic  General: Well-developed, well-nourished, no acute distress. Eyes: Pink conjunctiva, anicteric sclera. HEENT: Normocephalic, moist mucous membranes. Lungs: No audible wheezing or coughing. Heart: Regular rate and rhythm. Abdomen: Soft, nontender, no obvious distention. Musculoskeletal: No edema, cyanosis, or clubbing. Neuro: Alert, answering all questions appropriately. Cranial nerves grossly intact. Skin: No rashes or petechiae noted. Psych: Normal affect.  LAB RESULTS:  Lab Results  Component Value Date   NA 138 11/02/2023   K 3.9 11/02/2023   CL 106 11/02/2023   CO2 23 11/02/2023   GLUCOSE 98 11/02/2023   BUN 24 (H) 11/02/2023   CREATININE 0.82 11/02/2023   CALCIUM  9.2 11/02/2023   PROT 6.9 07/09/2023   ALBUMIN 3.7 07/09/2023   AST 21 07/09/2023   ALT 16 07/09/2023   ALKPHOS 98 07/09/2023   BILITOT 1.3 (H) 07/09/2023   GFRNONAA >60 11/02/2023   GFRAA 75 12/09/2019    Lab Results  Component Value Date   WBC 7.8 11/02/2023   NEUTROABS 6.0 05/24/2023   HGB 14.4 11/02/2023   HCT 44.4 11/02/2023   MCV 92.9 11/02/2023   PLT 224 11/02/2023     STUDIES: CUP PACEART REMOTE DEVICE CHECK Result Date: 10/26/2023 PPM Scheduled remote reviewed. Normal device function.  Presenting rhythm:  VP. Next remote 91 days. - CS, CVRS   ASSESSMENT: MGUS.  PLAN:    MGUS: Patient's M spike remains stable ranging between 0.3 and 0.5 since January 2025.  He has a mildly elevated IgM component ranging between 242 and 270 over the same timeframe.  Both kappa and lambda free light chains are elevated, but his ratio is within normal limits.  She has no evidence of endorgan damage.  No intervention is needed.  Patient does  not require bone marrow biopsy.  Return to clinic in 6 months with repeat laboratory work and further evaluation.   Dizziness: Unrelated to MGUS.  Continue follow-up with ENT and/or primary care.    I spent  a total of 20 minutes reviewing chart data, face-to-face evaluation with the patient, counseling and coordination of care as detailed above.    Patient expressed understanding and was in agreement with this plan. She also understands that She can call clinic at any time with any questions, concerns, or complaints.    Evalene JINNY Reusing, MD   11/10/2023 11:28 AM

## 2023-11-21 ENCOUNTER — Other Ambulatory Visit: Payer: Self-pay

## 2023-11-21 DIAGNOSIS — Z09 Encounter for follow-up examination after completed treatment for conditions other than malignant neoplasm: Secondary | ICD-10-CM | POA: Diagnosis not present

## 2023-11-21 DIAGNOSIS — Z8719 Personal history of other diseases of the digestive system: Secondary | ICD-10-CM | POA: Diagnosis not present

## 2023-11-22 ENCOUNTER — Telehealth: Payer: Self-pay

## 2023-11-22 NOTE — Telephone Encounter (Signed)
   Pre-operative Risk Assessment    Patient Name: Lauren Lloyd  DOB: 11/17/1940 MRN: [id number]   Date of last office visit: 11/08/23 WILBERT BIHARI, MD Date of next office visit: NONE   Request for Surgical Clearance    Procedure:  COLON  Date of Surgery:  Clearance 12/25/23                                Surgeon:  DR THERISA Socks Group or Practice Name:  [facility] Phone number:  [phone] Fax number:  [phone]  ATTN: CHIQUITA BAPTIST, CMA   Type of Clearance Requested:   - Medical  - Pharmacy:  Hold Apixaban  (Eliquis )     Type of Anesthesia:  Not Indicated   Additional requests/questions:    Signed, Lucie DELENA Ku   11/22/2023, 4:57 PM

## 2023-11-23 ENCOUNTER — Other Ambulatory Visit: Payer: Self-pay

## 2023-11-23 ENCOUNTER — Telehealth: Payer: Self-pay

## 2023-11-23 NOTE — Telephone Encounter (Signed)
 LVM asking pt to call our office to set up VV for preop clearance. Please reach out to preop team if pt returns call. Thank you

## 2023-11-23 NOTE — Telephone Encounter (Signed)
 Patient with diagnosis of A Fib on Eliquis  for anticoagulation.    Procedure: colonoscopy Date of procedure: 12/25/23   CHA2DS2-VASc Score = 6  This indicates a 9.7% annual risk of stroke. [name] score is based upon: CHF History: 1 HTN History: 1 Diabetes History: 0 Stroke History: 0 Vascular Disease History: 1 Age Score: 2 Gender Score: 1   CrCl 80 ml/min Platelet count 224K   Per office protocol, patient can hold Elqiuis for 2 days prior to procedure.    **This guidance is not considered finalized until pre-operative APP has relayed final recommendations.**

## 2023-11-23 NOTE — Telephone Encounter (Signed)
   Name: Lauren Lloyd  DOB: December 19, 1940  MRN: [id number]  Primary Cardiologist: Evalene Lunger, MD  Chart reviewed as part of pre-operative protocol coverage. Because of [name]'s past medical history and time since last visit, she will require a follow-up in-office visit with advanced HF clinic in order to better assess preoperative cardiovascular risk. Patient was to see advanced HF clinic in May per Dr. Starla notes from March. Recommend patient be seen for overdue follow up and cardiac evaluation for procedure.   Pre-op  covering staff: - Please schedule appointment and call patient to inform them. If patient already had an upcoming appointment within acceptable timeframe, please add pre-op  clearance to the appointment notes so provider is aware. - Please contact requesting surgeon's office via preferred method (i.e, phone, fax) to inform them of need for appointment prior to surgery.  This message will also be routed to pharmacy pool for input on holding Eliquis  as requested below so that this information is available to the clearing provider at time of patient's appointment.   Lizvet Chunn D Tylor Courtwright, NP  11/23/2023, 3:38 PM

## 2023-11-24 NOTE — Telephone Encounter (Signed)
2nd attempt to reach pt regarding surgical clearance and the need for an IN OFFICE appointment.  Left pt a detailed message to call back and get that scheduled.

## 2023-11-27 ENCOUNTER — Other Ambulatory Visit: Payer: Self-pay

## 2023-11-27 NOTE — Progress Notes (Signed)
 Specialty Pharmacy Refill Coordination Note  Lauren Lloyd is a 83 y.o. female contacted today regarding refills of specialty medication(s) Tafamidis  (Vyndamax )   Patient requested Delivery   Delivery date: 11/27/23   Verified address: [address] [place] [address]   Medication will be filled on 11/24/23.   (spoke to patient 7/11 and inadvertently did not complete assessment)

## 2023-11-27 NOTE — Telephone Encounter (Signed)
 Pt scheduled for OV on 12/19/23.

## 2023-11-28 ENCOUNTER — Ambulatory Visit: Admitting: Pulmonary Disease

## 2023-11-30 ENCOUNTER — Other Ambulatory Visit: Payer: Self-pay | Admitting: Family Medicine

## 2023-12-01 ENCOUNTER — Ambulatory Visit
Admission: RE | Admit: 2023-12-01 | Discharge: 2023-12-01 | Disposition: A | Discharge status: Home / Self Care | Source: Ambulatory Visit | Attending: Emergency Medicine | Admitting: Emergency Medicine

## 2023-12-01 ENCOUNTER — Ambulatory Visit: Payer: Self-pay

## 2023-12-01 VITALS — BP 117/55 | HR 72 | Temp 99.3°F | Resp 18

## 2023-12-01 DIAGNOSIS — R1032 Left lower quadrant pain: Secondary | ICD-10-CM | POA: Diagnosis not present

## 2023-12-01 MED ORDER — AMOXICILLIN-POT CLAVULANATE 875-125 MG PO TABS: 1.0000 | ORAL_TABLET | Freq: Two times a day (BID) | ORAL | 0 refills | Status: DC

## 2023-12-01 NOTE — ED Triage Notes (Signed)
 Patient reports a history diverticulitis. Patient complains left lower abdominal pain that started Wednesday. Reports that she took Tylenol  for pain last night. Rates pain 2/10.

## 2023-12-01 NOTE — ED Provider Notes (Signed)
 Lauren Lloyd    CSN: [id number] Arrival date & time: 12/01/23  1551      History   Chief Complaint Chief Complaint  Patient presents with   Abdominal Pain    Entered by patient    HPI Lauren Lloyd is a 83 y.o. female.   Patient presents for evaluation of left lower quadrant abdominal pain occurring intermittently beginning 1 day ago.  Exacerbated by walking.  Able to tolerate food and liquids but has intentionally decreased intake as she has been told to do so during prior flareups.  Has attempted use of Tylenol .  Denies nausea vomiting, fever, URI symptoms.  Feels similar to when she has had diverticulitis flareup.  Has diarrhea at baseline has not worsened.  Past Medical History:  Diagnosis Date   (HFpEF) heart failure with preserved ejection fraction (HCC)    a. 05/2018 Echo: EF 55-60%, no rwma, mild to mod MR. Nl RV fxn. Mod TR. PASP .   Arthritis    knees, Hands   Arthritis of knee    Back pain    Carotid arterial disease (HCC)    a. 03/2019 Carotid U/S: <50% bilat ICA stenoses.   CHF (congestive heart failure) (HCC)    Cholelithiasis    a. 10/2018 noted on CT.   Edema, lower extremity    Fatty liver    GERD (gastroesophageal reflux disease)    History of stress test    a. 06/2018 MV: EF 59%, no ischemia/infarct. Low risk.   Hypertension    Knee pain    Lactose intolerance    Mitral regurgitation    a. 05/2018 Echo: mild to mod MR.   Multinodular goiter    Obesity    OSA (obstructive sleep apnea)    PAF (paroxysmal atrial fibrillation) (HCC)    a.  Diagnosed 12/19; b. 05/2018 s/p DCCV; c. 03/2019 & 05/2019 recurrent AFib-->managed w/ amio load; d. CHADS2VASc = 6 (CHF, HTN, age x 2, vascular disease, female)-->Eliquis  & amio 100 qd.   PAH (pulmonary artery hypertension) (HCC)    RSV (acute bronchiolitis due to respiratory syncytial virus) 05/10/2022   Scoliosis    SOB (shortness of breath)    Swallowing difficulty     Patient Active Problem List    Diagnosis Date Noted   Referred otalgia of right ear 11/02/2023   Temporomandibular jaw dysfunction 11/02/2023   MGUS (monoclonal gammopathy of unknown significance) 07/25/2023   Dizziness 05/25/2023   Nasal congestion 05/25/2023   COVID-19 02/01/2023   Prediabetes 10/03/2022   Chronic gout due to renal impairment of multiple sites without tophus 08/11/2022   Stage 2 chronic kidney disease 08/11/2022   Acute gout due to renal impairment involving toe of right foot 08/03/2022   Acute diastolic (congestive) heart failure (HCC) 05/11/2022   Atrial fibrillation with RVR (HCC) 05/10/2022   S/P placement of cardiac pacemaker 12/02/2021   Tachy-brady syndrome (HCC) 12/02/2021   Secondary hypercoagulable state (HCC) 10/18/2021   Chronic hip pain (Left) 08/19/2021   Osteoarthritis of hip (Left) 08/02/2021   DDD (degenerative disc disease), lumbar 07/27/2021   Lumbar foraminal stenosis (Multilevel) (Bilateral) (R>L: T12-L1, L1-2, L5-S1) (L>R: L2-3, L3-4,) 07/19/2021   Abnormal MRI, lumbar spine (02/24/2021) 06/16/2021   Chronic low back pain (1ry area of Pain) (Bilateral) (R>L) w/o sciatica 06/16/2021   Chronic sacroiliac joint pain (3ry area of Pain) (Right) 06/16/2021   Lumbar facet syndrome (Bilateral) 06/16/2021   Grade 1 Anterolisthesis of lumbar spine of L4/L5 06/16/2021   Retrolisthesis  of L1 over L2 and L2 over L3 06/16/2021   Lumbar central spinal stenosis, w/o neurogenic claudication (L3-4, L4-5) 06/16/2021   Lumbosacral foraminal stenosis (Multilevel) (Bilateral) 06/16/2021   Lumbar lateral recess stenosis (Bilateral: L4-5) 06/16/2021   Lumbar facet arthropathy (Multilevel) (Bilateral) 06/16/2021   Chronic pain syndrome 06/15/2021   Pharmacologic therapy 06/15/2021   Disorder of skeletal system 06/15/2021   Problems influencing health status 06/15/2021   Chronic anticoagulation (Eliquis ) 12/05/2020   Carotid artery disease (HCC) 12/05/2020   Avitaminosis D 08/03/2020    Chronic fatigue 08/03/2020   Insomnia 08/03/2020   Obesity (BMI 30-39.9) 11/08/2019   Bradycardia 11/08/2019   Thoracic spondylosis 02/27/2019   Dyspnea on exertion 08/28/2018   Degenerative cervical spinal stenosis 07/26/2018   Persistent atrial fibrillation (HCC) 06/06/2018   Paresthesias 05/30/2018   Chronic diastolic CHF (congestive heart failure) (HCC) 05/11/2018   Centrilobular emphysema (HCC) 05/11/2018   Hematuria 04/04/2017   Leg cramps 04/04/2017   Metatarsalgia of both feet 04/04/2017   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/2017   Essential hypertension 01/03/2017   Hemorrhoids 01/03/2017   Chronic right shoulder pain 01/03/2017   Pulmonary hypertension, unspecified (HCC) 04/02/2015   Acid reflux 11/18/2014   IBS (irritable bowel syndrome) 11/18/2014   Primary osteoarthritis of one hip 10/03/2011   L-S radiculopathy 09/29/2011   S/P TKR (total knee replacement), left 09/29/2011   Primary osteoarthritis of left knee 09/29/2011   Non-toxic uninodular goiter 06/20/2009   Cervical pain 08/20/2008   OSA (obstructive sleep apnea) 12/12/2007   Hypercholesteremia 07/30/2007    Past Surgical History:  Procedure Laterality Date   AV NODE ABLATION N/A 07/05/2022   Procedure: AV NODE ABLATION;  Surgeon: Cindie Ole DASEN, MD;  Location: [facility];  Service: Cardiovascular;  Laterality: N/A;   CARDIOVERSION N/A 06/15/2018   Procedure: CARDIOVERSION (CATH LAB);  Surgeon: Perla Evalene PARAS, MD;  Location: [facility];  Service: Cardiovascular;  Laterality: N/A;   CARDIOVERSION N/A 02/18/2021   Procedure: CARDIOVERSION;  Surgeon: Perla Evalene PARAS, MD;  Location: [facility];  Service: Cardiovascular;  Laterality: N/A;   CATARACT EXTRACTION W/PHACO Right 01/25/2016   Procedure: CATARACT EXTRACTION PHACO AND INTRAOCULAR LENS PLACEMENT (IOC);  Surgeon: Donzell Arlyce Budd, MD;  Location: [facility];  Service: Ophthalmology;  Laterality: Right;  RIGHT   CATARACT  EXTRACTION W/PHACO Left 02/22/2016   Procedure: CATARACT EXTRACTION PHACO AND INTRAOCULAR LENS PLACEMENT (IOC);  Surgeon: Donzell Arlyce Budd, MD;  Location: [facility];  Service: Ophthalmology;  Laterality: Left;  LEFT   HAMMER TOE SURGERY  05/16/2008   KNEE ARTHROSCOPY Right 05/16/2002   PACEMAKER IMPLANT N/A 10/25/2021   Procedure: PACEMAKER IMPLANT;  Surgeon: Cindie Ole DASEN, MD;  Location: [facility];  Service: Cardiovascular;  Laterality: N/A;   PARTIAL HIP ARTHROPLASTY Left 12/17/2021   REPLACEMENT TOTAL KNEE Right 05/17/2007   [facility]   SKIN GRAFT Left 04/08/2013   Done on left index finger   TONSILLECTOMY  05/16/1944   TOTAL KNEE ARTHROPLASTY Left 12/02/2020    OB History     Gravida  6   Para  6   Term      Preterm      AB      Living         SAB      IAB      Ectopic      Multiple      Live Births  Home Medications    Prior to Admission medications   Medication Sig Start Date End Date Taking? Authorizing Provider  amoxicillin -clavulanate (AUGMENTIN ) 875-125 MG tablet Take 1 tablet by mouth every 12 (twelve) hours. 12/01/23  Yes Tamura Lasky, Shelba SAUNDERS, NP  allopurinol  (ZYLOPRIM ) 100 MG tablet TAKE ONE TABLET BY MOUTH EVERY DAY 03/07/23   Bacigalupo, Angela M, MD  calcium  carbonate (TUMS EX) 750 MG chewable tablet Chew 2 tablets by mouth daily as needed for heartburn. 12/07/20   [provider]  Cholecalciferol  25 MCG (1000 UT) tablet Take 1,000 Units by mouth daily.    [provider]  dapagliflozin  propanediol (FARXIGA ) 10 MG TABS tablet Take 1 tablet (10 mg total) by mouth daily before breakfast. 06/05/23   Zenaida Morene PARAS, MD  ELIQUIS  5 MG TABS tablet TAKE ONE TABLET TWICE DAILY 10/02/23   Gollan, Timothy J, MD  ezetimibe  (ZETIA ) 10 MG tablet TAKE 1 TABLET BY MOUTH DAILY 11/02/22   Bacigalupo, Angela M, MD  furosemide  (LASIX ) 40 MG tablet TAKE 1 TABLET BY MOUTH DAILY. TAKE AN  EXTRA TABLET AS NEEDED AFTER LUNCH FOR ABDOMINAL SWELLING, LEG SWELLING OR SHORTNESS OF BREATH 05/29/23   Gollan, Timothy J, MD  gabapentin  (NEURONTIN ) 600 MG tablet TAKE 1 TABLET BY MOUTH AT BEDTIME 10/17/23   Pardue, Lauraine SAILOR, DO  Magnesium  300 MG CAPS Take by mouth.    [provider]  Menthol, Topical Analgesic, (BIOFREEZE EX) Apply 1 application. topically daily as needed (Neck pain).    [provider]  metaxalone  (SKELAXIN ) 800 MG tablet Take 1 tablet (800 mg total) by mouth daily as needed for muscle spasms. 03/20/23   Bacigalupo, Angela M, MD  Multiple Vitamin (MULTIVITAMIN) capsule Take 1 capsule by mouth daily.    [provider]  mupirocin  ointment (BACTROBAN ) 2 % Place 1 application  into the nose 2 (two) times daily. 10/26/21   Lesia Ozell Barter, PA-C  potassium chloride  (KLOR-CON ) 10 MEQ tablet TAKE 1 TABLET BY MOUTH DAILY 08/07/23   Gollan, Timothy J, MD  Tafamidis  (VYNDAMAX ) 61 MG CAPS Take 1 capsule (61 mg total) by mouth daily. 07/27/23   Zenaida Morene PARAS, MD  Tiotropium Bromide -Olodaterol (STIOLTO RESPIMAT ) 2.5-2.5 MCG/ACT AERS Inhale 2 puffs into the lungs daily. 10/26/22   Hunsucker, Donnice SAUNDERS, MD    Family History Family History  Problem Relation Age of Onset   Hyperlipidemia Sister    Atrial fibrillation Sister    Transient ischemic attack Mother    Heart disease Mother    Heart attack Father    High blood pressure Father    Stroke Father    Healthy Brother    Breast cancer Sister 61   Hyperlipidemia Sister    Atrial fibrillation Sister     Social History Social History   Tobacco Use   Smoking status: Former    Current packs/day: 0.00    Average packs/day: 1 pack/day for 30.0 years (30.0 ttl pk-yrs)    Types: Cigarettes    Start date: 05/17/1959    Quit date: 05/16/1989    Years since quitting: 34.5    Passive exposure: Past   Smokeless tobacco: Never  Vaping Use   Vaping status: Never Used  Substance Use Topics   Alcohol use:  Yes    Alcohol/week: 14.0 standard drinks of alcohol    Types: 14 Glasses of wine per week   Drug use: No     Allergies   Levofloxacin, Influenza vaccine recombinant, Influenza vaccines, and Oysters [shellfish allergy]  Review of Systems Review of Systems   Physical Exam Triage Vital Signs ED Triage Vitals  Encounter Vitals Group     BP 12/01/23 1557 (!) 117/55     Girls Systolic BP Percentile --      Girls Diastolic BP Percentile --      Boys Systolic BP Percentile --      Boys Diastolic BP Percentile --      Pulse Rate 12/01/23 1557 72     Resp 12/01/23 1557 18     Temp 12/01/23 1557 99.3 F (37.4 C)     Temp src --      SpO2 12/01/23 1557 95 %     Weight --      Height --      Head Circumference --      Peak Flow --      Pain Score 12/01/23 1601 2     Pain Loc --      Pain Education --      Exclude from Growth Chart --    No data found.  Updated Vital Signs BP (!) 117/55 (BP Location: Left Arm)   Pulse 72   Temp 99.3 F (37.4 C)   Resp 18   SpO2 95%   Visual Acuity Right Eye Distance:   Left Eye Distance:   Bilateral Distance:    Right Eye Near:   Left Eye Near:    Bilateral Near:     Physical Exam Constitutional:      Appearance: Normal appearance.  Eyes:     Extraocular Movements: Extraocular movements intact.  Pulmonary:     Effort: Pulmonary effort is normal.  Abdominal:     General: Abdomen is flat. Bowel sounds are normal.     Palpations: Abdomen is soft.     Tenderness: There is abdominal tenderness in the left lower quadrant.  Neurological:     Mental Status: She is alert and oriented to person, place, and time.      UC Treatments / Results  Labs (all labs ordered are listed, but only abnormal results are displayed) Labs Reviewed - No data to display  EKG   Radiology No results found.  Procedures Procedures (including critical care time)  Medications Ordered in UC Medications - No data to display  Initial  Impression / Assessment and Plan / UC Course  I have reviewed the triage vital signs and the nursing notes.  Pertinent labs & imaging results that were available during my care of the patient were reviewed by me and considered in my medical decision making (see chart for details).  Left lower quadrant abdominal pain  Vital signs are stable, patient in no signs of distress nontoxic-appearing, has tenderness to the left lower quadrant on exam, not guarding and able to sit comfortably within exam room, stable for outpatient management, empirically placed on Augmentin  and advised to monitor recommended supportive care, for persisting symptoms advised PCP follow-up, has upcoming gastrointestinal appointment in September, for worsening symptoms given ER precautions Final Clinical Impressions(s) / UC Diagnoses   Final diagnoses:  LLQ abdominal pain     Discharge Instructions      You have been evaluated for abdominal pain, on exam there is tenderness to the left lower quadrant where the diverticulum lays  You are being treated empirically for infection, take Augmentin  twice daily for 7 days  Eat a bland diet and increase your fluid intake to maintain your hydration  May continue use of Tylenol  as needed for pain  If your symptoms continue to persist please follow-up with your primary doctor however if your symptoms are worsening please go to the nearest emergency department     ED Prescriptions     Medication Sig Dispense Auth. Provider   amoxicillin -clavulanate (AUGMENTIN ) 875-125 MG tablet Take 1 tablet by mouth every 12 (twelve) hours. 14 tablet Atif Chapple R, NP      PDMP not reviewed this encounter.   Teresa Shelba SAUNDERS, [place] 12/01/23 [phone]

## 2023-12-01 NOTE — Telephone Encounter (Signed)
 Requested medication (s) are due for refill today: yes  Requested medication (s) are on the active medication list: yes  Last refill:  Zetia : 11/02/22 #90 3 RF       allopurinol : 03/07/23 #90 1 RF  Future visit scheduled: yes  Notes to clinic:  overdue labs    Requested Prescriptions  Pending Prescriptions Disp Refills   ezetimibe  (ZETIA ) 10 MG tablet [Pharmacy Med Name: EZETIMIBE  10 MG TAB] 90 tablet 3    Sig: TAKE 1 TABLET BY MOUTH DAILY     Cardiovascular:  Antilipid - Sterol Transport Inhibitors Failed - 12/01/2023  1:19 PM      Failed - Lipid Panel in normal range within the last 12 months    Cholesterol, Total  Date Value Ref Range Status  10/28/2022 176 100 - 199 mg/dL Final   LDL Chol Calc (NIH)  Date Value Ref Range Status  10/28/2022 100 (H) 0 - 99 mg/dL Final   HDL  Date Value Ref Range Status  10/28/2022 54 >39 mg/dL Final   Triglycerides  Date Value Ref Range Status  10/28/2022 126 0 - 149 mg/dL Final         Passed - AST in normal range and within 360 days    AST  Date Value Ref Range Status  07/09/2023 21 15 - 41 U/L Final         Passed - ALT in normal range and within 360 days    ALT  Date Value Ref Range Status  07/09/2023 16 0 - 44 U/L Final         Passed - Patient is not pregnant      Passed - Valid encounter within last 12 months    Recent Outpatient Visits           4 months ago Diverticulitis   [facility] Bacigalupo, Jon HERO, MD       Future Appointments             Today  [place] [facility]    In 2 weeks Parthenia Olivia HERO, PA-C [facility] at [facility] Dept of [employer]. [facility], [facility]   In 1 month Bacigalupo, Jon HERO, MD [facility]             allopurinol  (ZYLOPRIM ) 100 MG tablet [Pharmacy Med Name: ALLOPURINOL  100 MG TAB] 90 tablet 1    Sig: TAKE ONE TABLET BY MOUTH EVERY DAY     Endocrinology:  Gout Agents - allopurinol  Failed -  12/01/2023  1:19 PM      Failed - Uric Acid in normal range and within 360 days    Uric Acid  Date Value Ref Range Status  08/25/2022 8.4 (H) 3.1 - 7.9 mg/dL Final    Comment:               Therapeutic target for gout patients: <6.0         Passed - Cr in normal range and within 360 days    Creatinine  Date Value Ref Range Status  11/02/2023 0.82 0.44 - 1.00 mg/dL Final   Creat  Date Value Ref Range Status  01/19/2017 0.78 0.60 - 0.93 mg/dL Final    Comment:    For patients >39 years of age, the reference limit for Creatinine is approximately 13% higher for people identified as African-American. SABRA Amy -  Valid encounter within last 12 months    Recent Outpatient Visits           4 months ago Diverticulitis   [facility] [place], Jon HERO, MD       Future Appointments             Today  [place] [facility]    In 2 weeks Parthenia, Olivia HERO, PA-C [facility] at [facility] Dept of [employer]. [facility], [facility]   In 1 month Bacigalupo, Jon HERO, MD [facility]            Passed - CBC within normal limits and completed in the last 12 months    WBC  Date Value Ref Range Status  07/09/2023 11.0 (H) 4.0 - 10.5 K/uL Final   WBC Count  Date Value Ref Range Status  11/02/2023 7.8 4.0 - 10.5 K/uL Final   RBC  Date Value Ref Range Status  11/02/2023 4.78 3.87 - 5.11 MIL/uL Final   Hemoglobin  Date Value Ref Range Status  11/02/2023 14.4 12.0 - 15.0 g/dL Final  98/91/7974 86.3 11.1 - 15.9 g/dL Final   HCT  Date Value Ref Range Status  11/02/2023 44.4 36.0 - 46.0 % Final   Hematocrit  Date Value Ref Range Status  05/24/2023 41.1 34.0 - 46.6 % Final   MCHC  Date Value Ref Range Status  11/02/2023 32.4 30.0 - 36.0 g/dL Final   [facility]  Date Value Ref Range Status  11/02/2023 30.1 26.0 - 34.0 pg Final   MCV  Date Value Ref Range Status  11/02/2023 92.9 80.0 - 100.0 fL  Final  05/24/2023 94 79 - 97 fL Final   No results found for: PLTCOUNTKUC, LABPLAT, POCPLA RDW  Date Value Ref Range Status  11/02/2023 13.2 11.5 - 15.5 % Final  05/24/2023 12.1 11.7 - 15.4 % Final

## 2023-12-01 NOTE — Discharge Instructions (Addendum)
 You have been evaluated for abdominal pain, on exam there is tenderness to the left lower quadrant where the diverticulum lays  You are being treated empirically for infection, take Augmentin  twice daily for 7 days  Eat a bland diet and increase your fluid intake to maintain your hydration  May continue use of Tylenol  as needed for pain  If your symptoms continue to persist please follow-up with your primary doctor however if your symptoms are worsening please go to the nearest emergency department

## 2023-12-01 NOTE — Telephone Encounter (Signed)
 FYI

## 2023-12-01 NOTE — Telephone Encounter (Signed)
 FYI Only or Action Required?: FYI only for provider.  Patient was last seen in primary care on 07/13/2023 by Myrla Jon HERO, MD.  Called Nurse Triage reporting Abdominal Pain.  Symptoms began yesterday.  Interventions attempted: OTC medications: Tylenol .  Symptoms are: LLQ abdominal pain came on suddenly, gradually worsening.  Triage Disposition: See HCP Within 4 Hours (Or PCP Triage)  Patient/caregiver understands and will follow disposition?: Yes                Copied from CRM [id number]. Topic: Clinical - Red Word Triage >> Dec 01, 2023 11:06 AM Turkey B wrote: [facility] that prompted transfer to Nurse Triage:  Pt has pain at a level 5 I stomach Reason for Disposition  [1] MILD-MODERATE pain AND [2] constant AND [3] present > 2 hours  Answer Assessment - Initial Assessment Questions Patient saw Dr B in February for diverticulitis. She states she had it again in March. She states amoxicillin  usually clears it up. She states she has an appointment for a colonoscopy in September.  1. LOCATION: Where does it hurt?      Lower left.  2. RADIATION: Does the pain shoot anywhere else? (e.g., chest, back)     She states the whole abdomen is a little sore but she states she did water aerobics and thinks that may be the cause. She states if she mashes on the LLQ that is the only side that hurts.  3. ONSET: When did the pain begin? (e.g., minutes, hours or days ago)      Yesterday morning.  4. SUDDEN: Gradual or sudden onset?     Sudden.  5. PATTERN Does the pain come and go, or is it constant?     Pretty constant  6. SEVERITY: How bad is the pain?  (e.g., Scale 1-10; mild, moderate, or severe)     4/10.  7. RECURRENT SYMPTOM: Have you ever had this type of stomach pain before? If Yes, ask: When was the last time? and What happened that time?      Yes, end of March and she states she was placed on Augmentin .  8. CAUSE: What do you think is  causing the stomach pain? (e.g., gallstones, recent abdominal surgery)     She states this feels like her diverticulitis.  9. RELIEVING/AGGRAVATING FACTORS: What makes it better or worse? (e.g., antacids, bending or twisting motion, bowel movement)     Lower abdominal pain relieved by urinating. Denies any other relieving or aggravating factors.  10. OTHER SYMPTOMS: Do you have any other symptoms? (e.g., back pain, diarrhea, fever, urination pain, vomiting)       Intermittent dizziness x 1 year, diarrhea (she states this has been her entire life she has 5 bowel movements daily) Patient denies burning or pain when urinating, she states she did not check her temperature (unsure if she had a fever), nausea, vomiting, bloody or black tarry BM.  11. PREGNANCY: Is there any chance you are pregnant? When was your last menstrual period?       N/A.  Protocols used: Abdominal Pain - Female-A-AH

## 2023-12-04 NOTE — Telephone Encounter (Signed)
 FYI - this can be filled by protocol for [facility] in the future

## 2023-12-05 ENCOUNTER — Ambulatory Visit: Payer: Self-pay

## 2023-12-05 NOTE — Telephone Encounter (Signed)
 Noted

## 2023-12-05 NOTE — Telephone Encounter (Signed)
 FYI Only or Action Required?: FYI only for provider.  Patient was last seen in primary care on 07/13/2023 by Myrla Jon HERO, MD.  Called Nurse Triage reporting Allergic Reaction.  Symptoms began several days ago.  Interventions attempted: OTC medications: Caladryl.  Symptoms are: stable.  Triage Disposition: See Physician Within 24 Hours  Patient/caregiver understands and will follow disposition?: No, pt declined appt with alt provider sooner, requests Thursday.   Copied from CRM [id number]. Topic: Clinical - Red Word Triage >> Dec 05, 2023  7:49 AM Willma SAUNDERS wrote: Red Word that prompted transfer to Nurse Triage: Patient called on 07/18 for stomach pain. Went to UC per nurse guidance. Was prescribed amoxicillin -clavulanate (AUGMENTIN ) 875-125 MG tablet. Saturday she noticed small swollen red bumps all over that are really itchy. Didn't take the medicine last night and they seem slightly better. Reason for Disposition  Hives or itching  Answer Assessment - Initial Assessment Questions 1. APPEARANCE of RASH: What does the rash look like? (e.g., spots, blisters, raised areas, skin peeling, scaly)     Round, raised 2. SIZE: How big are the spots? (e.g., tip of pen, eraser, coin; inches, centimeters)     Not quite as big as a penny 3. LOCATION: Where is the rash located?     Back, armpits, groin 4. COLOR: What color is the rash? (Note: It is difficult to assess rash color in people with darker-colored skin. When this situation occurs, simply ask the caller to describe what they see.)     Redness 5. ONSET: When did the rash begin?     Saturday, began Augmentin  Friday evening 6. FEVER: Do you have a fever? If Yes, ask: What is your temperature, how was it measured, and when did it start?     None 7. ITCHING: Does the rash itch? If Yes, ask: How bad is the itch? (Scale 1-10; or mild, moderate, severe)     9/10 8. CAUSE: What do you think is causing the rash?      Pt believes it's related to medication 9. NEW MEDICINES: What new medicines are you taking? (e.g., name of antibiotic) When did you start taking this medication?.     Augmentin  10. OTHER SYMPTOMS: Do you have any other symptoms? (e.g., sore throat, fever, joint pain)       None  Protocols used: Rash - Widespread On Drugs-A-AH

## 2023-12-07 ENCOUNTER — Encounter: Payer: Self-pay | Admitting: Family Medicine

## 2023-12-07 ENCOUNTER — Ambulatory Visit (INDEPENDENT_AMBULATORY_CARE_PROVIDER_SITE_OTHER): Admitting: Family Medicine

## 2023-12-07 VITALS — BP 155/66 | HR 70 | Ht 67.0 in | Wt 214.7 lb

## 2023-12-07 DIAGNOSIS — K5792 Diverticulitis of intestine, part unspecified, without perforation or abscess without bleeding: Secondary | ICD-10-CM

## 2023-12-07 DIAGNOSIS — R21 Rash and other nonspecific skin eruption: Secondary | ICD-10-CM

## 2023-12-07 MED ORDER — SULFAMETHOXAZOLE-TRIMETHOPRIM 800-160 MG PO TABS: 1.0000 | ORAL_TABLET | Freq: Two times a day (BID) | ORAL | 0 refills | Status: DC

## 2023-12-07 MED ORDER — METRONIDAZOLE 500 MG PO TABS: 500.0000 mg | ORAL_TABLET | Freq: Two times a day (BID) | ORAL | 0 refills | Status: DC

## 2023-12-07 NOTE — Progress Notes (Signed)
 Established patient visit   Patient: Lauren Lloyd   DOB: November 26, 1940   [name]  MRN: [id number] Visit Date: 12/07/2023  Today's healthcare provider: LAURAINE LOISE BUOY, DO   Chief Complaint  Patient presents with   Rash    Seen at [facility] 12/01/23 for LLQ pain and given Augmentin .  Patient unsure is rash was there before the Augmentin , pt has stopped taking the medication. Last day taken 12/04/23.  Bumps on back, Sides, legs, arms    Subjective    HPI Lauren Lloyd is an 83 year old female with history of diverticulitis who presents with recent left-sided abdominal pain and rash.  She experiences recurrent left-sided, left lower abdominal pain, which she associates with her history of diverticulitis.  Patient had a first episode of diverticulitis February 2025 (noted on CT) and states she has been experiencing them approximately monthly since then.  She does have follow-up scheduled with gastroenterology in September.  Patient was seen 12/01/2023 for left lower quadrant pain, which was treated with Augmentin  empirically as diverticulitis.  She developed a rash after starting the Augmentin , characterized by flat bumps on various parts of her body, including under her arm and on her back, but not on her hands. The rash improved after discontinuing Augmentin . No recent exposure to poison ivy or mosquito bites. She reports that she has not started any new medications other than Augmentin .  She is concerned about managing potential flare-ups during an upcoming river trip due to lack of access to prescription refills.  She mentions a history of consuming more fried foods than usual, which she suspects may have contributed to her diverticulitis flare-ups. Her current medications include Tafadimis, which she has been taking since February for heart-related issues, with no adverse reactions. She has previously been prescribed Augmentin  multiple times for diverticulitis and has a history of using  Bactrim  and doxycycline  for other conditions without problem.  She has a history of joint pain, which she attributes to her age, and recalls a past episode of hives related to stress when she had her third child.  She does note that she was experiencing a lot of stress between Saturday and the start of the week.       Medications: Outpatient Medications Prior to Visit  Medication Sig   allopurinol  (ZYLOPRIM ) 100 MG tablet TAKE ONE TABLET BY MOUTH EVERY DAY   calcium  carbonate (TUMS EX) 750 MG chewable tablet Chew 2 tablets by mouth daily as needed for heartburn.   Cholecalciferol  25 MCG (1000 UT) tablet Take 1,000 Units by mouth daily.   dapagliflozin  propanediol (FARXIGA ) 10 MG TABS tablet Take 1 tablet (10 mg total) by mouth daily before breakfast.   ELIQUIS  5 MG TABS tablet TAKE ONE TABLET TWICE DAILY   ezetimibe  (ZETIA ) 10 MG tablet TAKE 1 TABLET BY MOUTH DAILY   furosemide  (LASIX ) 40 MG tablet TAKE 1 TABLET BY MOUTH DAILY. TAKE AN EXTRA TABLET AS NEEDED AFTER LUNCH FOR ABDOMINAL SWELLING, LEG SWELLING OR SHORTNESS OF BREATH   gabapentin  (NEURONTIN ) 600 MG tablet TAKE 1 TABLET BY MOUTH AT BEDTIME   Magnesium  300 MG CAPS Take by mouth.   Menthol, Topical Analgesic, (BIOFREEZE EX) Apply 1 application. topically daily as needed (Neck pain).   metaxalone  (SKELAXIN ) 800 MG tablet Take 1 tablet (800 mg total) by mouth daily as needed for muscle spasms.   Multiple Vitamin (MULTIVITAMIN) capsule Take 1 capsule by mouth daily.   mupirocin  ointment (BACTROBAN ) 2 % Place 1  application  into the nose 2 (two) times daily.   potassium chloride  (KLOR-CON ) 10 MEQ tablet TAKE 1 TABLET BY MOUTH DAILY   Tafamidis  (VYNDAMAX ) 61 MG CAPS Take 1 capsule (61 mg total) by mouth daily.   Tiotropium Bromide -Olodaterol (STIOLTO RESPIMAT ) 2.5-2.5 MCG/ACT AERS Inhale 2 puffs into the lungs daily.   [DISCONTINUED] amoxicillin -clavulanate (AUGMENTIN ) 875-125 MG tablet Take 1 tablet by mouth every 12 (twelve) hours.    No facility-administered medications prior to visit.        Objective    BP (!) 155/66 (BP Location: Left Arm, Patient Position: Sitting, Cuff Size: Large)   Pulse 70   Ht 5' 7 (1.702 m)   Wt 214 lb 11.2 oz (97.4 kg)   SpO2 99%   BMI 33.63 kg/m     Physical Exam Skin:        Comments: A couple small (<0.5 cm diameter) lesions as noted; patient reports other lesions previously present across back and near groin (now resolved)      No results found for any visits on 12/07/23.  Assessment & Plan    Diverticulitis -     Sulfamethoxazole -Trimethoprim ; Take 1 tablet by mouth 2 (two) times daily.  Dispense: 14 tablet; Refill: 0 -     metroNIDAZOLE ; Take 1 tablet (500 mg total) by mouth 2 (two) times daily.  Dispense: 14 tablet; Refill: 0  Rash -     Ambulatory referral to Allergy     Diverticulitis Recurrent sigmoid diverticulitis with monthly episodes. Scheduled for gastroenterology evaluation and probable colonoscopy in September. Bactrim  and metronidazole  selected for flare-ups due to previous adverse reactions to Augmentin  and Levaquin. - Prescribe Bactrim  and metronidazole  for flare-ups during river trip. - Avoid Augmentin  due to possible allergic reaction. - Maintain and attend gastroenterology follow-up in September for evaluation and colonoscopy. - Advise keeping a food diary to identify dietary triggers.  Rash Rash potentially related to Augmentin , with significant itching improved after discontinuation. Differential includes allergic reaction to Augmentin  or stress-induced hives. - Discontinue Augmentin  to avoid allergic reaction. - Refer for allergy testing to determine amoxicillin  allergy.    Return if symptoms worsen or fail to improve.      I discussed the assessment and treatment plan with the patient  The patient was provided an opportunity to ask questions and all were answered. The patient agreed with the plan and demonstrated an understanding of  the instructions.   The patient was advised to call back or seek an in-person evaluation if the symptoms worsen or if the condition fails to improve as anticipated.    LAURAINE LOISE BUOY, DO  [facility] [phone] (phone) [phone] (fax)  [facility]

## 2023-12-11 ENCOUNTER — Encounter: Payer: Self-pay | Admitting: Family Medicine

## 2023-12-12 ENCOUNTER — Other Ambulatory Visit (HOSPITAL_COMMUNITY): Payer: Self-pay

## 2023-12-15 ENCOUNTER — Other Ambulatory Visit: Payer: Self-pay

## 2023-12-15 NOTE — Progress Notes (Signed)
 Specialty Pharmacy Refill Coordination Note  Lauren Lloyd is a 83 y.o. female contacted today regarding refills of specialty medication(s) Tafamidis  (Vyndamax )   Patient requested Delivery   Delivery date: 12/19/23   Verified address: [address] [place] [address]   Medication will be filled on 12/18/23.

## 2023-12-19 ENCOUNTER — Ambulatory Visit: Admitting: Physician Assistant

## 2023-12-21 NOTE — Progress Notes (Unsigned)
 Cardiology Office Note   Date:  12/22/2023  ID:  Lauren Lloyd, Lauren Lloyd 1940/12/16, MRN [id number] PCP: Myrla Jon HERO, MD  [facility] Providers Cardiologist:  Evalene Lunger, MD Electrophysiologist:  OLE ONEIDA HOLTS, MD   History of Present Illness Lauren Lloyd is a 83 y.o. female with a history of HFpEF, permanent atrial fibrillation status post AV nodal ablation, hypertension, OSA, COPD who presents for preop evaluation.  Echo in May 2024 showed EF of 60 to 65%, normal RV, RV SP 38 mmHg, moderate TR.  Patient is seen by advanced heart failure clinic.  Patient was last seen in March 2025 and felt improvement after beta-blocker was held.  Today, the patient presents for pre-op  for colonoscopy for diverticulitis. Pharmacy recommended holding Eliquis  2 days prior to procedure.She denies chest pain. SOB is stable and unchanged. She has chronic dizziness for 1 year. She takes lasix  daily that controls swelling. She used to walk 3 miles a day, but no longer has the stamina. Has trouble standing up with dizziness. She can do household chores, walk 1-2 blocks and up 1 flight of stairs. She has not had a stroke, no DM2   Studies Reviewed EKG Interpretation Date/Time:  Friday December 22 2023 08:44:54 EDT Ventricular Rate:  70 PR Interval:    QRS Duration:  150 QT Interval:  446 QTC Calculation: 481 R Axis:   -64  Text Interpretation: Ventricular-paced rhythm When compared with ECG of 29-May-2023 16:46, Vent. rate has decreased BY   3 BPM Confirmed by Franchester, Miara Emminger ([id number]) on 12/22/2023 9:10:08 AM    07/2023   Myocardial uptake was positive for radiotracer uptake. The visual grade of myocardial uptake relative to the ribs was Grade 2 (Myocardial uptake equal to rib uptake).   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   Echo 09/2022  1. Left ventricular ejection fraction, by estimation, is 60 to 65%. The  left ventricle has normal function. The left  ventricle has no regional  wall motion abnormalities. There is mild left ventricular hypertrophy.  Left ventricular diastolic parameters  are indeterminate.   2. Right ventricular systolic function is normal. The right ventricular  size is normal. There is mildly elevated pulmonary artery systolic  pressure. The estimated right ventricular systolic pressure is 37.9 mmHg.   3. The mitral valve is normal in structure. Moderate mitral valve  regurgitation. No evidence of mitral stenosis.   4. Tricuspid valve regurgitation is moderate.   5. The aortic valve is tricuspid. Aortic valve regurgitation is not  visualized. No aortic stenosis is present.   6. The inferior vena cava is normal in size with greater than 50%  respiratory variability, suggesting right atrial pressure of 3 mmHg.   Echo 04/2021  1. Left ventricular ejection fraction, by estimation, is 60 to 65%. The  left ventricle has normal function. The left ventricle has no regional  wall motion abnormalities. There is mild left ventricular hypertrophy.  Left ventricular diastolic parameters  are indeterminate.   2. Right ventricular systolic function is mildly reduced. The right  ventricular size is mildly enlarged. There is moderately elevated  pulmonary artery systolic pressure. The estimated right ventricular  systolic pressure is 47.7 mmHg.   3. Left atrial size was moderately dilated.   4. Right atrial size was severely dilated.   5. The mitral valve is normal in structure. Moderate mitral valve  regurgitation.   6. Tricuspid valve regurgitation is severe.   7. The inferior  vena cava is normal in size with <50% respiratory  variability, suggesting right atrial pressure of 8 mmHg.      Physical Exam VS:  BP 120/60 (BP Location: Left Arm, Patient Position: Sitting, Cuff Size: Normal)   Pulse 70   Ht 5' 7 (1.702 m)   Wt 211 lb 9.6 oz (96 kg)   SpO2 98%   BMI 33.14 kg/m   Orthostatic VS for the past 24 hrs (Last 3  readings):  BP- Lying Pulse- Lying BP- Sitting Pulse- Sitting BP- Standing at 0 minutes Pulse- Standing at 0 minutes BP- Standing at 3 minutes Pulse- Standing at 3 minutes  12/22/23 0909 143/83 69 133/78 70 132/78 72 144/79 78      Wt Readings from Last 3 Encounters:  12/22/23 211 lb 9.6 oz (96 kg)  12/07/23 214 lb 11.2 oz (97.4 kg)  11/10/23 216 lb (98 kg)    GEN: Well nourished, well developed in no acute distress NECK: No JVD; No carotid bruits CARDIAC: RRR, no murmurs, rubs, gallops RESPIRATORY:  Clear to auscultation without rales, wheezing or rhonchi  ABDOMEN: Soft, non-tender, non-distended EXTREMITIES:  No edema; No deformity   ASSESSMENT AND PLAN  Pre-operative exam for colonoscopy Patient has been having diverticulitis issues and is scheduled for colonoscopy.  Patient is overall stable from a cardiac perspective.  She denies chest pain.  She has chronic shortness of breath that is unchanged.  She is euvolemic on exam.  She does complain of dizziness which has been going on for 1 year.  We will plan on carotid ultrasound, this will not postpone colonoscopy.  METs greater than 4, although she does have trouble given dizziness. Can hold Eliquis  2 days prior to procedure. RCRI=1.1% risk of MACE No further cardiac work-up prior to colonoscopy  Dizziness Orthostatics today negative.  She reports negative workup from ENT in [place] and in [place].  I will send a referral to neurology.  I will check a carotid ultrasound.  Chronic diastolic heart failure AL workup showed MUGA's but normal K/L ratio.  PYP scan positive with grade 2 uptake in H/CL 1.4.  Continue Lasix , Farxiga , tafamidis .She is followed by advanced heart failure team.   Permanent Afib Tachy-brady syndrome/LBBB s/p BiV  Most recent device check showed normally functioning device. Will refer to EP to further discuss dizziness concerns. Continue Eliquis  5mg  BID for stroke ppx.  MGUS Patient was referred to  hematology.        Dispo: Follow-up in 3 months  Signed, Greogry Goodwyn VEAR Fishman, PA-C

## 2023-12-21 NOTE — Progress Notes (Signed)
 Remote pacemaker transmission.

## 2023-12-21 NOTE — Addendum Note (Signed)
 Addended by: VICCI SELLER A on: 12/21/2023 11:57 AM   Modules accepted: Orders

## 2023-12-22 ENCOUNTER — Encounter: Payer: Self-pay | Admitting: Medical

## 2023-12-22 ENCOUNTER — Ambulatory Visit: Attending: Medical | Admitting: Medical

## 2023-12-22 VITALS — BP 120/60 | HR 70 | Ht 67.0 in | Wt 211.6 lb

## 2023-12-22 DIAGNOSIS — Z01810 Encounter for preprocedural cardiovascular examination: Secondary | ICD-10-CM | POA: Diagnosis not present

## 2023-12-22 DIAGNOSIS — R42 Dizziness and giddiness: Secondary | ICD-10-CM | POA: Diagnosis not present

## 2023-12-22 DIAGNOSIS — Z950 Presence of cardiac pacemaker: Secondary | ICD-10-CM

## 2023-12-22 DIAGNOSIS — D472 Monoclonal gammopathy: Secondary | ICD-10-CM

## 2023-12-22 DIAGNOSIS — I5032 Chronic diastolic (congestive) heart failure: Secondary | ICD-10-CM

## 2023-12-22 DIAGNOSIS — I495 Sick sinus syndrome: Secondary | ICD-10-CM | POA: Diagnosis not present

## 2023-12-22 DIAGNOSIS — I4821 Permanent atrial fibrillation: Secondary | ICD-10-CM | POA: Diagnosis not present

## 2023-12-22 NOTE — Patient Instructions (Signed)
 Medication Instructions:  Your physician recommends that you continue on your current medications as directed. Please refer to the Current Medication list given to you today.    *If you need a refill on your cardiac medications before your next appointment, please call your pharmacy*  Lab Work: No labs ordered today    Testing/Procedures: Your physician has requested that you have a carotid duplex. This test is an ultrasound of the carotid arteries in your neck. It looks at blood flow through these arteries that supply the brain with blood.   Allow one hour for this exam.  There are no restrictions or special instructions.  This will take place at [address] [facility], [place] [address]  Please note: We ask at that you not bring children with you during ultrasound (echo/ vascular) testing. Due to room size and safety concerns, children are not allowed in the ultrasound rooms during exams. Our front office staff cannot provide observation of children in our lobby area while testing is being conducted. An adult accompanying a patient to their appointment will only be allowed in the ultrasound room at the discretion of the ultrasound technician under special circumstances. We apologize for any inconvenience.   Follow-Up: At [facility], you and your health needs are our priority.  As part of our continuing mission to provide you with exceptional heart care, our providers are all part of one team.  This team includes your primary Cardiologist (physician) and Advanced Practice Providers or APPs (Physician Assistants and Nurse Practitioners) who all work together to provide you with the care you need, when you need it.  Your next appointment:   3 month(s)  Provider:   You may see Timothy Gollan, MD or one of the following Advanced Practice Providers on your designated Care Team:   Cadence Franchester, [place]  Your physician recommends that you schedule a follow-up  appointment with Dr. Cindie

## 2023-12-25 NOTE — Telephone Encounter (Signed)
 Chiquita from [facility] calling to update pts surgery date 01/22/2024.

## 2023-12-28 ENCOUNTER — Other Ambulatory Visit: Payer: Self-pay | Admitting: Family Medicine

## 2023-12-28 ENCOUNTER — Telehealth: Payer: Self-pay

## 2023-12-28 DIAGNOSIS — K5792 Diverticulitis of intestine, part unspecified, without perforation or abscess without bleeding: Secondary | ICD-10-CM

## 2023-12-28 MED ORDER — METRONIDAZOLE 500 MG PO TABS: 500.0000 mg | ORAL_TABLET | Freq: Two times a day (BID) | ORAL | 0 refills | Status: AC

## 2023-12-28 MED ORDER — SULFAMETHOXAZOLE-TRIMETHOPRIM 800-160 MG PO TABS: 1.0000 | ORAL_TABLET | Freq: Two times a day (BID) | ORAL | 0 refills | Status: AC

## 2023-12-28 NOTE — Telephone Encounter (Signed)
 Copied from CRM #[id number]. Topic: Clinical - Medication Question >> Dec 28, 2023  2:18 PM [name] wrote: Pt wants to know if she can get prescriptions for diverticulitis. She says she thinks it back. Please call her back to get the name of the medication.

## 2023-12-28 NOTE — Telephone Encounter (Signed)
 Patient was seen by Dr.Pardue recently and Dr. Donzella sent in medication for patient. Patient advised

## 2023-12-28 NOTE — Telephone Encounter (Signed)
 Pt should be seen in person for this.

## 2024-01-10 ENCOUNTER — Telehealth: Payer: Self-pay | Admitting: Student

## 2024-01-10 ENCOUNTER — Other Ambulatory Visit: Payer: Self-pay

## 2024-01-10 NOTE — Telephone Encounter (Signed)
 Chiquita with [facility] is following up regarding clearance request. Patient was seen on 8/08. Per Chiquita, their office hasn't received any updates. She says this is needed by 8/29. Please advise.  Phone#:[phone] Fax#: [phone]

## 2024-01-10 NOTE — Telephone Encounter (Signed)
 Preop clearance now routed to surgeons office

## 2024-01-11 ENCOUNTER — Ambulatory Visit: Admitting: Family Medicine

## 2024-01-11 ENCOUNTER — Encounter: Payer: Self-pay | Admitting: Family Medicine

## 2024-01-11 VITALS — BP 142/69 | HR 70 | Temp 98.0°F | Ht 67.0 in | Wt 213.8 lb

## 2024-01-11 DIAGNOSIS — E7800 Pure hypercholesterolemia, unspecified: Secondary | ICD-10-CM | POA: Diagnosis not present

## 2024-01-11 DIAGNOSIS — Z95811 Presence of heart assist device: Secondary | ICD-10-CM | POA: Diagnosis not present

## 2024-01-11 DIAGNOSIS — R7303 Prediabetes: Secondary | ICD-10-CM

## 2024-01-11 DIAGNOSIS — J432 Centrilobular emphysema: Secondary | ICD-10-CM

## 2024-01-11 DIAGNOSIS — Z0000 Encounter for general adult medical examination without abnormal findings: Secondary | ICD-10-CM | POA: Diagnosis not present

## 2024-01-11 MED ORDER — GABAPENTIN 600 MG PO TABS: 600.0000 mg | ORAL_TABLET | Freq: Every day | ORAL | 3 refills | Status: AC

## 2024-01-11 NOTE — Telephone Encounter (Signed)
 Caller Lauren Lloyd) stated she has not received the faxed clearance and the fax# [phone] is correct.  Caller wants clearance re-faxed.

## 2024-01-11 NOTE — Telephone Encounter (Signed)
 Clearance has been refax to requesting office

## 2024-01-11 NOTE — Assessment & Plan Note (Signed)
 Long history of smoking F/b pulm

## 2024-01-11 NOTE — Assessment & Plan Note (Signed)
 Pacemaker in place F/b cardiology

## 2024-01-11 NOTE — Assessment & Plan Note (Signed)
 Recommend low carb diet Recheck A1c

## 2024-01-11 NOTE — Patient Instructions (Signed)
Consider asking at the pharmacy about the tetanus shot (TDAP) ?

## 2024-01-11 NOTE — Progress Notes (Signed)
 Complete physical exam   Patient: Lauren Lloyd   DOB: 1941-02-22   [name]  MRN: [id number] Visit Date: 01/11/2024  Today's healthcare provider: Jon Eva, MD   Chief Complaint  Patient presents with   Annual Exam    Diet- General Exercise- Trying but no Overall feeling- Having issues with her diverticulitis Sleep- Last little while sleeping well Concerns- None   Subjective    Lauren Lloyd is a 83 y.o. female who presents today for a complete physical exam.   Discussed the use of AI scribe software for clinical note transcription with the patient, who gave verbal consent to proceed.  History of Present Illness   Lauren Lloyd is an 83 year old female who presents for an annual physical exam.  She has recurrent diverticulitis and is scheduled for a colonoscopy on Monday, expressing concern due to a family history of complications. Chronic low back pain worsens after sitting for an hour, and she takes gabapentin  for pain management. Previous injections provided only temporary relief, and she experiences difficulty moving after sitting.  She uses a CPAP machine and is frustrated with the cost of replacement parts. Vyndamax  has significantly improved her breathing following a positive experience at the heart failure clinic in [place].  She experiences ongoing dizziness and is scheduled for an artery check tomorrow.        Last depression screening scores    01/11/2024    9:59 AM 12/07/2023    9:52 AM 11/10/2023   10:34 AM  PHQ 2/9 Scores  PHQ - 2 Score 0 0 0  PHQ- 9 Score 3 1    Last fall risk screening    01/11/2024    9:58 AM  Fall Risk   Falls in the past year? 0  Number falls in past yr: 0  Injury with Fall? 0  Risk for fall due to : No Fall Risks        Medications: Outpatient Medications Prior to Visit  Medication Sig   allopurinol  (ZYLOPRIM ) 100 MG tablet TAKE ONE TABLET BY MOUTH EVERY DAY   calcium  carbonate (TUMS EX) 750 MG  chewable tablet Chew 2 tablets by mouth daily as needed for heartburn.   Cholecalciferol  25 MCG (1000 UT) tablet Take 1,000 Units by mouth daily.   dapagliflozin  propanediol (FARXIGA ) 10 MG TABS tablet Take 1 tablet (10 mg total) by mouth daily before breakfast.   ELIQUIS  5 MG TABS tablet TAKE ONE TABLET TWICE DAILY   ezetimibe  (ZETIA ) 10 MG tablet TAKE 1 TABLET BY MOUTH DAILY   furosemide  (LASIX ) 40 MG tablet TAKE 1 TABLET BY MOUTH DAILY. TAKE AN EXTRA TABLET AS NEEDED AFTER LUNCH FOR ABDOMINAL SWELLING, LEG SWELLING OR SHORTNESS OF BREATH   lactobacillus acidophilus (BACID) TABS tablet Take 2 tablets by mouth 3 (three) times daily.   Magnesium  300 MG CAPS Take by mouth.   Menthol, Topical Analgesic, (BIOFREEZE EX) Apply 1 application. topically daily as needed (Neck pain).   metaxalone  (SKELAXIN ) 800 MG tablet Take 1 tablet (800 mg total) by mouth daily as needed for muscle spasms.   Multiple Vitamin (MULTIVITAMIN) capsule Take 1 capsule by mouth daily.   mupirocin  ointment (BACTROBAN ) 2 % Place 1 application  into the nose 2 (two) times daily.   potassium chloride  (KLOR-CON ) 10 MEQ tablet TAKE 1 TABLET BY MOUTH DAILY   Tafamidis  (VYNDAMAX ) 61 MG CAPS Take 1 capsule (61 mg total) by mouth daily.   Tiotropium Bromide -Olodaterol (STIOLTO RESPIMAT ) 2.5-2.5 MCG/ACT  AERS Inhale 2 puffs into the lungs daily.   [DISCONTINUED] gabapentin  (NEURONTIN ) 600 MG tablet TAKE 1 TABLET BY MOUTH AT BEDTIME   No facility-administered medications prior to visit.    Review of Systems    Objective    BP (!) 142/69 (BP Location: Left Arm, Patient Position: Sitting, Cuff Size: Normal)   Pulse 70   Temp 98 F (36.7 C) (Oral)   Ht 5' 7 (1.702 m)   Wt 213 lb 12.8 oz (97 kg)   SpO2 100%   BMI 33.49 kg/m    Physical Exam Vitals reviewed.  Constitutional:      General: She is not in acute distress.    Appearance: Normal appearance. She is well-developed. She is not diaphoretic.  HENT:     Head:  Normocephalic and atraumatic.     Right Ear: Tympanic membrane, ear canal and external ear normal.     Left Ear: Tympanic membrane, ear canal and external ear normal.     Nose: Nose normal.     Mouth/Throat:     Mouth: Mucous membranes are moist.     Pharynx: Oropharynx is clear. No oropharyngeal exudate.  Eyes:     General: No scleral icterus.    Conjunctiva/sclera: Conjunctivae normal.     Pupils: Pupils are equal, round, and reactive to light.  Neck:     Thyroid : No thyromegaly.  Cardiovascular:     Rate and Rhythm: Normal rate and regular rhythm.     Heart sounds: Normal heart sounds. No murmur heard. Pulmonary:     Effort: Pulmonary effort is normal. No respiratory distress.     Breath sounds: Normal breath sounds. No wheezing or rales.  Abdominal:     General: There is no distension.     Palpations: Abdomen is soft.     Tenderness: There is no abdominal tenderness.  Musculoskeletal:        General: No deformity.     Cervical back: Neck supple.     Right lower leg: No edema.     Left lower leg: No edema.  Lymphadenopathy:     Cervical: No cervical adenopathy.  Skin:    General: Skin is warm and dry.     Findings: No rash.  Neurological:     Mental Status: She is alert and oriented to person, place, and time. Mental status is at baseline.     Gait: Gait normal.  Psychiatric:        Mood and Affect: Mood normal.        Behavior: Behavior normal.        Thought Content: Thought content normal.      No results found for any visits on 01/11/24.  Assessment & Plan    Routine Health Maintenance and Physical Exam  Exercise Activities and Dietary recommendations  Goals      Reduce portion size     Recommend decreasing portion sizes for each meal (3) and add in 2 healthy snacks in between.      Slow Disease Progression     Patient is initiating therapy. Patient will maintain adherence      Track and Manage Fluids and Swelling-Heart Failure     Timeframe:   Long-Range Goal Priority:  High Start Date: 08/13/21                            Expected End Date: 08/14/22  Follow Up within 90 days   - call office if I gain more than 2 pounds in one day or 5 pounds in one week - use salt in moderation - watch for swelling in feet, ankles and legs every day - weigh myself daily    Why is this important?   It is important to check your weight daily and watch how much salt and liquids you have.  It will help you to manage your heart failure.    Notes:         Immunization History  Administered Date(s) Administered    sv, Bivalent, Protein Subunit Rsvpref,pf [place]) 05/03/2023   Hepatitis A 11/25/1999, 09/19/2001   Hepatitis A, Adult 11/25/1999, 09/19/2001   IPV 02/11/2000   PFIZER(Purple Top)SARS-COV-2 Vaccination 06/14/2019, 07/05/2019, 04/15/2020   Pneumococcal Conjugate-13 10/06/2014   Pneumococcal Polysaccharide-23 12/01/2010   Td 02/02/1998   Tdap 09/11/2007, 02/27/2012   Typhoid Inactivated 02/11/2000   Zoster Recombinant(Shingrix) 02/06/2020, 05/29/2020    Health Maintenance  Topic Date Due   DTaP/Tdap/Td (4 - Td or Tdap) 02/26/2022   COVID-19 Vaccine (4 - 2024-25 season) 01/15/2023   Medicare Annual Wellness (AWV)  01/11/2024   INFLUENZA VACCINE  08/13/2024 (Originally 12/15/2023)   Pneumococcal Vaccine: 50+ Years  Completed   DEXA SCAN  Completed   Zoster Vaccines- Shingrix  Completed   HPV VACCINES  Aged Out   Meningococcal B Vaccine  Aged Out    Discussed health benefits of physical activity, and encouraged her to engage in regular exercise appropriate for her age and condition.  Problem List Items Addressed This Visit       Respiratory   Centrilobular emphysema (HCC)   Long history of smoking F/b pulm        Other   Hypercholesteremia   Relevant Orders   Hepatic function panel   Lipid panel   Prediabetes   Recommend low carb diet Recheck A1c       Relevant Orders   Hemoglobin A1c    Presence of heart assist device [facility])   Pacemaker in place F/b cardiology      Other Visit Diagnoses       Encounter for annual physical exam    -  Primary   Relevant Orders   Hepatic function panel   Lipid panel   Hemoglobin A1c           Adult Wellness Visit Current visit focused on physical examination and addressing specific health concerns. - Scheduled for a wellness visit in five days.  Recurrent diverticulitis Recurrent episodes with concern due to family history of complications. - Perform colonoscopy with Dr. Therisa on Monday. - Discuss potential surgical options post-colonoscopy if diverticulitis recurs.  Heart failure Managed by cardiology and heart failure clinic. Vyndamax  has improved breathing. Satisfaction with care from Dr. Zenaida in [place]. - Continue Vyndamax  as prescribed. - Follow up with cardiology and heart failure clinic as scheduled.  Low back pain with lumbar arthritis Chronic pain with lumbar arthritis. Gabapentin  provides pain management. Previous injections provided temporary relief. Stiffness after prolonged sitting with no definitive fix for arthritis-related stiffness. - Continue gabapentin  for pain management.  Pure hypercholesterolemia Cholesterol has not been checked in a year. - Order cholesterol test.  General Health Maintenance Up to date on pneumonia and shingles vaccinations. Declines flu and COVID booster shots. Due for a tetanus shot. - Administer tetanus shot at pharmacy.       Return in about 1 year (around 01/10/2025) for CPE.  Jon Eva, MD  [facility] [phone] (phone) [phone] (fax)  [facility]

## 2024-01-12 ENCOUNTER — Ambulatory Visit: Attending: Medical

## 2024-01-12 ENCOUNTER — Ambulatory Visit: Payer: Self-pay | Admitting: Family Medicine

## 2024-01-12 DIAGNOSIS — R42 Dizziness and giddiness: Secondary | ICD-10-CM

## 2024-01-12 LAB — HEPATIC FUNCTION PANEL
ALT: 27 IU/L (ref 0–32)
AST: 30 IU/L (ref 0–40)
Albumin: 4.3 g/dL (ref 3.7–4.7)
Alkaline Phosphatase: 141 IU/L — ABNORMAL HIGH (ref 44–121)
Bilirubin Total: 0.7 mg/dL (ref 0.0–1.2)
Bilirubin, Direct: 0.25 mg/dL (ref 0.00–0.40)
Total Protein: 7.1 g/dL (ref 6.0–8.5)

## 2024-01-12 LAB — LIPID PANEL
Chol/HDL Ratio: 3.1 ratio (ref 0.0–4.4)
Cholesterol, Total: 217 mg/dL — ABNORMAL HIGH (ref 100–199)
HDL: 71 mg/dL (ref >39)
LDL Chol Calc (NIH): 124 mg/dL — ABNORMAL HIGH (ref 0–99)
Triglycerides: 124 mg/dL (ref 0–149)
VLDL Cholesterol Cal: 22 mg/dL (ref 5–40)

## 2024-01-12 LAB — HEMOGLOBIN A1C
Est. average glucose Bld gHb Est-mCnc: 111 mg/dL
Hgb A1c MFr Bld: 5.5 % (ref 4.8–5.6)

## 2024-01-15 ENCOUNTER — Ambulatory Visit: Payer: Self-pay | Admitting: Medical

## 2024-01-15 DIAGNOSIS — I779 Disorder of arteries and arterioles, unspecified: Secondary | ICD-10-CM

## 2024-01-17 ENCOUNTER — Ambulatory Visit: Payer: Medicare Other

## 2024-01-17 DIAGNOSIS — Z0000 Encounter for general adult medical examination without abnormal findings: Secondary | ICD-10-CM

## 2024-01-17 DIAGNOSIS — Z1231 Encounter for screening mammogram for malignant neoplasm of breast: Secondary | ICD-10-CM

## 2024-01-17 NOTE — Progress Notes (Cosign Needed Addendum)
 Subjective:   Lauren Lloyd is a 83 y.o. who presents for a Medicare Wellness preventive visit.  As a reminder, Annual Wellness Visits don't include a physical exam, and some assessments may be limited, especially if this visit is performed virtually. We may recommend an in-person follow-up visit with your provider if needed.  Visit Complete: Virtual I connected with  Lauren Lloyd on 01/17/24 by a audio enabled telemedicine application and verified that I am speaking with the correct person using two identifiers.  Patient Location: Home  Provider Location: Home Office  I discussed the limitations of evaluation and management by telemedicine. The patient expressed understanding and agreed to proceed.  Vital Signs: Because this visit was a virtual/telehealth visit, some criteria may be missing or patient reported. Any vitals not documented were not able to be obtained and vitals that have been documented are patient reported.  VideoDeclined- This patient declined Librarian, academic. Therefore the visit was completed with audio only.  Persons Participating in Visit: Patient.  AWV Questionnaire: No: Patient Medicare AWV questionnaire was not completed prior to this visit.  Cardiac Risk Factors include: advanced age (>76men, >12 women);dyslipidemia;hypertension;sedentary lifestyle;obesity (BMI >30kg/m2)     Objective:    There were no vitals filed for this visit. There is no height or weight on file to calculate BMI.     01/17/2024    9:19 AM 11/10/2023   10:31 AM 08/03/2023   11:39 AM 07/09/2023   12:32 PM 05/26/2023    2:31 PM 01/25/2023    2:15 PM 01/11/2023   11:25 AM  Advanced Directives  Does Patient Have a Medical Advance Directive? No Yes Yes Yes Yes Yes Yes  Type of Furniture conservator/restorer;Living will Healthcare Power of [place];Living will Living will  Healthcare Power of [place];Living will Healthcare Power of  [place];Living will  Would patient like information on creating a medical advance directive? No - Patient declined          Current Medications (verified) Outpatient Encounter Medications as of 01/17/2024  Medication Sig   allopurinol  (ZYLOPRIM ) 100 MG tablet TAKE ONE TABLET BY MOUTH EVERY DAY   calcium  carbonate (TUMS EX) 750 MG chewable tablet Chew 2 tablets by mouth daily as needed for heartburn.   Cholecalciferol  25 MCG (1000 UT) tablet Take 1,000 Units by mouth daily.   dapagliflozin  propanediol (FARXIGA ) 10 MG TABS tablet Take 1 tablet (10 mg total) by mouth daily before breakfast.   ELIQUIS  5 MG TABS tablet TAKE ONE TABLET TWICE DAILY   ezetimibe  (ZETIA ) 10 MG tablet TAKE 1 TABLET BY MOUTH DAILY   furosemide  (LASIX ) 40 MG tablet TAKE 1 TABLET BY MOUTH DAILY. TAKE AN EXTRA TABLET AS NEEDED AFTER LUNCH FOR ABDOMINAL SWELLING, LEG SWELLING OR SHORTNESS OF BREATH   gabapentin  (NEURONTIN ) 600 MG tablet Take 1 tablet (600 mg total) by mouth at bedtime.   Magnesium  300 MG CAPS Take by mouth.   Menthol, Topical Analgesic, (BIOFREEZE EX) Apply 1 application. topically daily as needed (Neck pain).   metaxalone  (SKELAXIN ) 800 MG tablet Take 1 tablet (800 mg total) by mouth daily as needed for muscle spasms.   Multiple Vitamin (MULTIVITAMIN) capsule Take 1 capsule by mouth daily.   mupirocin  ointment (BACTROBAN ) 2 % Place 1 application  into the nose 2 (two) times daily.   potassium chloride  (KLOR-CON ) 10 MEQ tablet TAKE 1 TABLET BY MOUTH DAILY   Tafamidis  (VYNDAMAX ) 61 MG CAPS Take 1 capsule (61 mg  total) by mouth daily.   Tiotropium Bromide -Olodaterol (STIOLTO RESPIMAT ) 2.5-2.5 MCG/ACT AERS Inhale 2 puffs into the lungs daily.   lactobacillus acidophilus (BACID) TABS tablet Take 2 tablets by mouth 3 (three) times daily. (Patient not taking: Reported on 01/17/2024)   No facility-administered encounter medications on file as of 01/17/2024.    Allergies (verified) Levofloxacin, Influenza vaccine  recombinant, Influenza vaccines, and Oysters [shellfish allergy]   History: Past Medical History:  Diagnosis Date   (HFpEF) heart failure with preserved ejection fraction (HCC)    a. 05/2018 Echo: EF 55-60%, no rwma, mild to mod MR. Nl RV fxn. Mod TR. PASP .   Arthritis    knees, Hands   Arthritis of knee    Back pain    Carotid arterial disease (HCC)    a. 03/2019 Carotid U/S: <50% bilat ICA stenoses.   CHF (congestive heart failure) (HCC)    Cholelithiasis    a. 10/2018 noted on CT.   Edema, lower extremity    Fatty liver    GERD (gastroesophageal reflux disease)    History of stress test    a. 06/2018 MV: EF 59%, no ischemia/infarct. Low risk.   Hypertension    Knee pain    Lactose intolerance    Mitral regurgitation    a. 05/2018 Echo: mild to mod MR.   Multinodular goiter    Obesity    OSA (obstructive sleep apnea)    PAF (paroxysmal atrial fibrillation) (HCC)    a.  Diagnosed 12/19; b. 05/2018 s/p DCCV; c. 03/2019 & 05/2019 recurrent AFib-->managed w/ amio load; d. CHADS2VASc = 6 (CHF, HTN, age x 2, vascular disease, female)-->Eliquis  & amio 100 qd.   PAH (pulmonary artery hypertension) (HCC)    RSV (acute bronchiolitis due to respiratory syncytial virus) 05/10/2022   Scoliosis    SOB (shortness of breath)    Swallowing difficulty    Past Surgical History:  Procedure Laterality Date   AV NODE ABLATION N/A 07/05/2022   Procedure: AV NODE ABLATION;  Surgeon: Cindie Ole DASEN, MD;  Location: [facility];  Service: Cardiovascular;  Laterality: N/A;   CARDIOVERSION N/A 06/15/2018   Procedure: CARDIOVERSION (CATH LAB);  Surgeon: Perla Evalene PARAS, MD;  Location: [facility] OR[facility];  Service: Cardiovascular;  Laterality: N/A;   CARDIOVERSION N/A 02/18/2021   Procedure: CARDIOVERSION;  Surgeon: Perla Evalene PARAS, MD;  Location: [facility] OR[facility];  Service: Cardiovascular;  Laterality: N/A;   CATARACT EXTRACTION W/PHACO Right 01/25/2016   Procedure: CATARACT EXTRACTION PHACO AND  INTRAOCULAR LENS PLACEMENT (IOC);  Surgeon: Donzell Arlyce Budd, MD;  Location: [facility];  Service: Ophthalmology;  Laterality: Right;  RIGHT   CATARACT EXTRACTION W/PHACO Left 02/22/2016   Procedure: CATARACT EXTRACTION PHACO AND INTRAOCULAR LENS PLACEMENT (IOC);  Surgeon: Donzell Arlyce Budd, MD;  Location: [facility];  Service: Ophthalmology;  Laterality: Left;  LEFT   HAMMER TOE SURGERY  05/16/2008   KNEE ARTHROSCOPY Right 05/16/2002   PACEMAKER IMPLANT N/A 10/25/2021   Procedure: PACEMAKER IMPLANT;  Surgeon: Cindie Ole DASEN, MD;  Location: [facility];  Service: Cardiovascular;  Laterality: N/A;   PARTIAL HIP ARTHROPLASTY Left 12/17/2021   REPLACEMENT TOTAL KNEE Right 05/17/2007   [facility]   SKIN GRAFT Left 04/08/2013   Done on left index finger   TONSILLECTOMY  05/16/1944   TOTAL KNEE ARTHROPLASTY Left 12/02/2020   Family History  Problem Relation Age of Onset   Hyperlipidemia Sister    Atrial fibrillation Sister    Transient  ischemic attack Mother    Heart disease Mother    Heart attack Father    High blood pressure Father    Stroke Father    Healthy Brother    Breast cancer Sister 44   Hyperlipidemia Sister    Atrial fibrillation Sister    Social History   Socioeconomic History   Marital status: Single    Spouse name: Not on file   Number of children: 5   Years of education: college   Highest education level: Bachelor's degree (e.g., BA, AB, BS)  Occupational History   Occupation: Lawyer: [employer]  Tobacco Use   Smoking status: Former    Current packs/day: 0.00    Average packs/day: 1 pack/day for 30.0 years (30.0 ttl pk-yrs)    Types: Cigarettes    Start date: 05/17/1959    Quit date: 05/16/1989    Years since quitting: 34.6    Passive exposure: Past   Smokeless tobacco: Never  Vaping Use   Vaping status: Never Used  Substance and Sexual Activity   Alcohol use:  Yes    Alcohol/week: 14.0 standard drinks of alcohol    Types: 14 Glasses of wine per week   Drug use: No   Sexual activity: Not Currently  Other Topics Concern   Not on file  Social History Narrative   Pt has a child who passed away at age 13   Social Drivers of Health   Financial Resource Strain: Low Risk  (01/17/2024)   Overall Financial Resource Strain (CARDIA)    Difficulty of Paying Living Expenses: Not hard at all  Food Insecurity: No Food Insecurity (01/17/2024)   Hunger Vital Sign    Worried About Running Out of Food in the Last Year: Never true    Ran Out of Food in the Last Year: Never true  Transportation Needs: No Transportation Needs (01/17/2024)   PRAPARE - Administrator, Civil Service (Medical): No    Lack of Transportation (Non-Medical): No  Physical Activity: Inactive (01/17/2024)   Exercise Vital Sign    Days of Exercise per Week: 0 days    Minutes of Exercise per Session: 0 min  Stress: No Stress Concern Present (01/17/2024)   [employer] of Occupational Health - Occupational Stress Questionnaire    Feeling of Stress: Not at all  Social Connections: Moderately Integrated (01/17/2024)   Social Connection and Isolation Panel    Frequency of Communication with Friends and Family: More than three times a week    Frequency of Social Gatherings with Friends and Family: More than three times a week    Attends Religious Services: More than 4 times per year    Active Member of [employer] or Organizations: Yes    Attends Engineer, structural: More than 4 times per year    Marital Status: Divorced    Tobacco Counseling Counseling given: Not Answered    Clinical Intake:  Pre-visit preparation completed: Yes        BMI - recorded: 33.4 Nutritional Status: BMI > 30  Obese Nutritional Risks: None Diabetes: No  Lab Results  Component Value Date   HGBA1C 5.5 01/11/2024   HGBA1C 6.0 (H) 10/28/2022   HGBA1C 6.0 (H) 03/17/2022     How often do  you need to have someone help you when you read instructions, pamphlets, or other written materials from your doctor or pharmacy?: 1 - Never  Interpreter Needed?: No  Information entered by ::  JHONNIE DAS, LPN   Activities of Daily Living     01/17/2024    9:21 AM  In your present state of health, do you have any difficulty performing the following activities:  Hearing? 0  Vision? 0  Difficulty concentrating or making decisions? 0  Walking or climbing stairs? 1  Comment DIZZINESS  Dressing or bathing? 0  Doing errands, shopping? 0  Preparing Food and eating ? N  Using the Toilet? N  In the past six months, have you accidently leaked urine? N  Do you have problems with loss of bowel control? N  Managing your Medications? N  Managing your Finances? N  Housekeeping or managing your Housekeeping? N    Patient Care Team: Myrla Jon HERO, MD as PCP - General (Family Medicine) Perla Evalene PARAS, MD as PCP - Cardiology (Cardiology) Cindie Ole DASEN, MD as PCP - Electrophysiology (Cardiology) Avanell Katz, MD as Referring Physician (Physical Medicine and Rehabilitation) Parris Manna, MD as Consulting Physician (Pulmonary Disease) Dasher, Alm LABOR, MD (Dermatology) Midge Sober, DO as Referring Physician (Family Medicine) Jacobo Evalene PARAS, MD as Consulting Physician (Hematology and Oncology) Pa, [facility] (Optometry)  I have updated your Care Teams any recent Medical Services you may have received from other providers in the past year.     Assessment:   This is a routine wellness examination for Lauren Lloyd.  Hearing/Vision screen Hearing Screening - Comments:: NO AIDS Vision Screening - Comments:: WEARS GLASSES ALL DAY- [facility]   Goals Addressed             This Visit's Progress    DIET - EAT MORE FRUITS AND VEGETABLES         Depression Screen     01/17/2024    9:18 AM 01/11/2024    9:59 AM 12/07/2023    9:52 AM 11/10/2023   10:34 AM  08/03/2023    2:25 PM 06/12/2023    2:05 PM 05/24/2023    1:51 PM  PHQ 2/9 Scores  PHQ - 2 Score 0 0 0 0 0 0 0  PHQ- 9 Score 0 3 1   2      Fall Risk     01/17/2024    9:21 AM 01/11/2024    9:58 AM 12/07/2023    9:52 AM 05/24/2023    1:50 PM 01/25/2023    2:09 PM  Fall Risk   Falls in the past year? 0 0 0 1 0  Number falls in past yr: 0 0 0 0 0  Injury with Fall? 0 0 0 0 0  Risk for fall due to : -- No Fall Risks No Fall Risks Other (Comment) Impaired balance/gait  Risk for fall due to: Comment DIZZINESS      Follow up Falls evaluation completed;Falls prevention discussed   Falls evaluation completed Falls evaluation completed;Education provided;Falls prevention discussed    MEDICARE RISK AT HOME:  Medicare Risk at Home Any stairs in or around the home?: Yes If so, are there any without handrails?: No Home free of loose throw rugs in walkways, pet beds, electrical cords, etc?: Yes Adequate lighting in your home to reduce risk of falls?: Yes Life alert?: No Use of a cane, walker or w/c?: No Grab bars in the bathroom?: Yes Shower chair or bench in shower?: Yes Elevated toilet seat or a handicapped toilet?: No  TIMED UP AND GO:  Was the test performed?  No  Cognitive Function: 6CIT completed        01/17/2024  9:23 AM 01/11/2023   11:29 AM 02/08/2021    9:11 AM 02/03/2020    8:45 AM 01/03/2019   10:10 AM  6CIT Screen  What Year? 0 points 0 points 0 points 0 points 0 points  What month? 0 points 0 points 0 points 0 points 0 points  What time? 0 points 0 points 0 points 0 points 0 points  Count back from 20 0 points 0 points 0 points 0 points 0 points  Months in reverse 0 points 0 points 0 points 0 points 0 points  Repeat phrase 0 points 0 points 0 points 0 points 2 points  Total Score 0 points 0 points 0 points 0 points 2 points    Immunizations Immunization History  Administered Date(s) Administered    sv, Bivalent, Protein Subunit Rsvpref,pf [place]) 05/03/2023    Hepatitis A 11/25/1999, 09/19/2001   Hepatitis A, Adult 11/25/1999, 09/19/2001   IPV 02/11/2000   PFIZER(Purple Top)SARS-COV-2 Vaccination 06/14/2019, 07/05/2019, 04/15/2020   Pneumococcal Conjugate-13 10/06/2014   Pneumococcal Polysaccharide-23 12/01/2010   Td 02/02/1998   Tdap 09/11/2007, 02/27/2012   Typhoid Inactivated 02/11/2000   Zoster Recombinant(Shingrix) 02/06/2020, 05/29/2020    Screening Tests Health Maintenance  Topic Date Due   Colonoscopy  02/21/2019   DTaP/Tdap/Td (4 - Td or Tdap) 02/26/2022   COVID-19 Vaccine (4 - 2025-26 season) 01/15/2024   INFLUENZA VACCINE  08/13/2024 (Originally 12/15/2023)   MAMMOGRAM  04/25/2024   Medicare Annual Wellness (AWV)  01/16/2025   Pneumococcal Vaccine: 50+ Years  Completed   DEXA SCAN  Completed   Zoster Vaccines- Shingrix  Completed   HPV VACCINES  Aged Out   Meningococcal B Vaccine  Aged Out    Health Maintenance  Health Maintenance Due  Topic Date Due   Colonoscopy  02/21/2019   DTaP/Tdap/Td (4 - Td or Tdap) 02/26/2022   COVID-19 Vaccine (4 - 2025-26 season) 01/15/2024   Health Maintenance Items Addressed: UP TO DATE ON SHOTS; DOESN'T WANT ANY  MORE COVIDS; ALLERGIC TO FLU VACCINE; AGED OUT OF  BDS; STILL HAVING MAMMOGRAMS SO ONE ORDERED THIS VISIT- HAVING A COLONOSCOPY D/T DIVERTICULITIS   Additional Screening:  Vision Screening: Recommended annual ophthalmology exams for early detection of glaucoma and other disorders of the eye. Would you like a referral to an eye doctor? No    Dental Screening: Recommended annual dental exams for proper oral hygiene  Community Resource Referral / Chronic Care Management: CRR required this visit?  No   CCM required this visit?  No   Plan:    I have personally reviewed and noted the following in the patient's chart:   Medical and social history Use of alcohol, tobacco or illicit drugs  Current medications and supplements including opioid prescriptions. Patient is not  currently taking opioid prescriptions. Functional ability and status Nutritional status Physical activity Advanced directives List of other physicians Hospitalizations, surgeries, and ER visits in previous 12 months Vitals Screenings to include cognitive, depression, and falls Referrals and appointments  In addition, I have reviewed and discussed with patient certain preventive protocols, quality metrics, and best practice recommendations. A written personalized care plan for preventive services as well as general preventive health recommendations were provided to patient.   Jhonnie GORMAN Das, LPN   0/10/7972   After Visit Summary: (MyChart) Due to this being a telephonic visit, the after visit summary with patients personalized plan was offered to patient via MyChart   Notes: MAMMOGRAM ORDERED

## 2024-01-17 NOTE — Patient Instructions (Addendum)
 Lauren Lloyd , Thank you for taking time out of your busy schedule to complete your Annual Wellness Visit with me. I enjoyed our conversation and look forward to speaking with you again next year. I, as well as your care team,  appreciate your ongoing commitment to your health goals. Please review the following plan we discussed and let me know if I can assist you in the future.  REFERRAL SENT FOR MAMMOGRAM You have an order for:  []   2D Mammogram  [x]   3D Mammogram  []   Bone Density     Please call for appointment:  [facility]  [address] [place] [address] [phone] [facility] [address] [place], [place] [address] [phone] [facility] [address] [place], [place] [address] [phone]   Make sure to wear two-piece clothing.  No lotions, powders, or deodorants the day of the appointment. Make sure to bring picture ID and insurance card.  Bring list of medications you are currently taking including any supplements.   Schedule your [place] screening mammogram through MyChart!   Log into your MyChart account.  Go to 'Visit' (or 'Appointments' if on mobile App) --> Schedule an Appointment  Under 'Select a Reason for Visit' choose the Mammogram Screening option.  Complete the pre-visit questions and select the time and place that best fits your schedule.    Follow up Visits: 01/21/25 @ 3:10 PM BY PHONE We will see or speak with you next year for your Next Medicare AWV with our clinical staff Have you seen your provider in the last 6 months (3 months if uncontrolled diabetes)? Yes  Clinician Recommendations:  Aim for 30 minutes of exercise or brisk walking, 6-8 glasses of water, and 5 servings of fruits and vegetables each day. TAKE CARE!      This is a list of the screenings recommended for you:  Health Maintenance  Topic Date  Due   Colon Cancer Screening  02/21/2019   DTaP/Tdap/Td vaccine (4 - Td or Tdap) 02/26/2022   COVID-19 Vaccine (4 - 2025-26 season) 01/15/2024   Flu Shot  08/13/2024*   Mammogram  04/25/2024   Medicare Annual Wellness Visit  01/16/2025   Pneumococcal Vaccine for age over 53  Completed   DEXA scan (bone density measurement)  Completed   Zoster (Shingles) Vaccine  Completed   HPV Vaccine  Aged Out   Meningitis B Vaccine  Aged Out  *Topic was postponed. The date shown is not the original due date.    Advanced directives: (ACP Link)Information on Advanced Care Planning can be found at [employer]  Secretary of [facility] Advance Health Care Directives Advance Health Care Directives. [email or web address]  Advance Care Planning is important because it:  [x]  Makes sure you receive the medical care that is consistent with your values, goals, and preferences  [x]  It provides guidance to your family and loved ones and reduces their decisional burden about whether or not they are making the right decisions based on your wishes.  Follow the link provided in your after visit summary or read over the paperwork we have mailed to you to help you started getting your Advance Directives in place. If you need assistance in completing these, please reach out to us  so that we can help you!

## 2024-01-18 ENCOUNTER — Other Ambulatory Visit: Payer: Self-pay

## 2024-01-18 ENCOUNTER — Other Ambulatory Visit (HOSPITAL_COMMUNITY): Payer: Self-pay

## 2024-01-18 NOTE — Progress Notes (Signed)
 Specialty Pharmacy Refill Coordination Note  Spoke with Lauren Lloyd is a 83 y.o. female contacted today regarding refills of specialty medication(s) Tafamidis  (Vyndamax )  Doses on hand: 9  Patient requested: Delivery   Delivery date: 01/23/24   Verified address: [address] [place] [address]  Medication will be filled on 01/22/24.

## 2024-01-19 ENCOUNTER — Other Ambulatory Visit: Payer: Self-pay

## 2024-01-19 ENCOUNTER — Other Ambulatory Visit: Payer: Self-pay | Admitting: Cardiovascular Disease

## 2024-01-19 ENCOUNTER — Telehealth: Payer: Self-pay

## 2024-01-19 NOTE — Telephone Encounter (Signed)
-----   Message from Jon Eva sent at 01/16/2024 11:37 AM EDT ----- Regarding: RE: LABS Thanks Lorrie,  CMAs, you can convert to a telephone encounter. I see that cardiology called her with Carotid US  results. Please relay that her liver function tests were stable. ----- Message ----- From: Gwenn Jhonnie RAMAN, LPN Sent: 0/11/7972  11:30 AM EDT To: Jon CHRISTELLA Eva, MD Subject: LABS                                           Hi Dr.B, this patient was wanting results of recent labs for her 'liver'- I didn't see anything- also, she was wondering if you had results from her carotid u/s from Friday- I didn't see that either... thanks, Lorrie

## 2024-01-19 NOTE — Progress Notes (Signed)
 Clinical Intervention Note  Clinical Intervention Notes: Patient reported starting probiotic medication, unsure of name. Completed DDI check with Vyndamax ; none found.   Clinical Intervention Outcomes: Prevention of an adverse drug event   Powell CHRISTELLA Gallus Specialty Pharmacist

## 2024-01-19 NOTE — Telephone Encounter (Signed)
 LVM asking patient to return call. E2C2 nurse may give message that liver function panel was normal per Dr. Bacigalupo

## 2024-01-22 ENCOUNTER — Ambulatory Visit: Admit: 2024-01-22 | Admitting: Gastroenterology

## 2024-01-22 ENCOUNTER — Ambulatory Visit
Admission: RE | Admit: 2024-01-22 | Discharge: 2024-01-22 | Disposition: A | Discharge status: Home / Self Care | Attending: Gastroenterology | Admitting: Gastroenterology

## 2024-01-22 ENCOUNTER — Encounter
Admission: RE | Disposition: A | Discharge status: Home / Self Care | Payer: Self-pay | Source: Home / Self Care | Attending: Gastroenterology

## 2024-01-22 ENCOUNTER — Ambulatory Visit: Admitting: Certified Registered"

## 2024-01-22 ENCOUNTER — Encounter: Payer: Self-pay | Admitting: Gastroenterology

## 2024-01-22 DIAGNOSIS — I272 Pulmonary hypertension, unspecified: Secondary | ICD-10-CM | POA: Diagnosis not present

## 2024-01-22 DIAGNOSIS — Z09 Encounter for follow-up examination after completed treatment for conditions other than malignant neoplasm: Secondary | ICD-10-CM | POA: Diagnosis not present

## 2024-01-22 DIAGNOSIS — I4891 Unspecified atrial fibrillation: Secondary | ICD-10-CM | POA: Insufficient documentation

## 2024-01-22 DIAGNOSIS — I214 Non-ST elevation (NSTEMI) myocardial infarction: Secondary | ICD-10-CM | POA: Diagnosis not present

## 2024-01-22 DIAGNOSIS — K635 Polyp of colon: Secondary | ICD-10-CM | POA: Diagnosis not present

## 2024-01-22 DIAGNOSIS — Z6833 Body mass index (BMI) 33.0-33.9, adult: Secondary | ICD-10-CM | POA: Insufficient documentation

## 2024-01-22 DIAGNOSIS — D120 Benign neoplasm of cecum: Secondary | ICD-10-CM | POA: Diagnosis not present

## 2024-01-22 DIAGNOSIS — E669 Obesity, unspecified: Secondary | ICD-10-CM | POA: Insufficient documentation

## 2024-01-22 DIAGNOSIS — K219 Gastro-esophageal reflux disease without esophagitis: Secondary | ICD-10-CM | POA: Insufficient documentation

## 2024-01-22 DIAGNOSIS — E66811 Obesity, class 1: Secondary | ICD-10-CM | POA: Diagnosis not present

## 2024-01-22 DIAGNOSIS — K760 Fatty (change of) liver, not elsewhere classified: Secondary | ICD-10-CM | POA: Diagnosis not present

## 2024-01-22 DIAGNOSIS — K5732 Diverticulitis of large intestine without perforation or abscess without bleeding: Secondary | ICD-10-CM | POA: Diagnosis not present

## 2024-01-22 DIAGNOSIS — I447 Left bundle-branch block, unspecified: Secondary | ICD-10-CM | POA: Diagnosis not present

## 2024-01-22 DIAGNOSIS — I480 Paroxysmal atrial fibrillation: Secondary | ICD-10-CM | POA: Diagnosis not present

## 2024-01-22 DIAGNOSIS — G4733 Obstructive sleep apnea (adult) (pediatric): Secondary | ICD-10-CM | POA: Insufficient documentation

## 2024-01-22 DIAGNOSIS — I110 Hypertensive heart disease with heart failure: Secondary | ICD-10-CM | POA: Diagnosis not present

## 2024-01-22 DIAGNOSIS — I517 Cardiomegaly: Secondary | ICD-10-CM | POA: Diagnosis not present

## 2024-01-22 DIAGNOSIS — M109 Gout, unspecified: Secondary | ICD-10-CM | POA: Diagnosis not present

## 2024-01-22 DIAGNOSIS — I4821 Permanent atrial fibrillation: Secondary | ICD-10-CM | POA: Diagnosis not present

## 2024-01-22 DIAGNOSIS — Z79899 Other long term (current) drug therapy: Secondary | ICD-10-CM | POA: Diagnosis not present

## 2024-01-22 DIAGNOSIS — I5032 Chronic diastolic (congestive) heart failure: Secondary | ICD-10-CM | POA: Diagnosis not present

## 2024-01-22 DIAGNOSIS — Z8719 Personal history of other diseases of the digestive system: Secondary | ICD-10-CM | POA: Diagnosis not present

## 2024-01-22 DIAGNOSIS — R079 Chest pain, unspecified: Secondary | ICD-10-CM | POA: Diagnosis not present

## 2024-01-22 DIAGNOSIS — Z87891 Personal history of nicotine dependence: Secondary | ICD-10-CM | POA: Insufficient documentation

## 2024-01-22 DIAGNOSIS — J449 Chronic obstructive pulmonary disease, unspecified: Secondary | ICD-10-CM | POA: Insufficient documentation

## 2024-01-22 DIAGNOSIS — I472 Ventricular tachycardia, unspecified: Secondary | ICD-10-CM | POA: Diagnosis not present

## 2024-01-22 DIAGNOSIS — I340 Nonrheumatic mitral (valve) insufficiency: Secondary | ICD-10-CM | POA: Diagnosis not present

## 2024-01-22 DIAGNOSIS — I2721 Secondary pulmonary arterial hypertension: Secondary | ICD-10-CM | POA: Diagnosis not present

## 2024-01-22 DIAGNOSIS — D472 Monoclonal gammopathy: Secondary | ICD-10-CM | POA: Diagnosis not present

## 2024-01-22 DIAGNOSIS — G629 Polyneuropathy, unspecified: Secondary | ICD-10-CM | POA: Diagnosis not present

## 2024-01-22 DIAGNOSIS — K5730 Diverticulosis of large intestine without perforation or abscess without bleeding: Secondary | ICD-10-CM | POA: Insufficient documentation

## 2024-01-22 DIAGNOSIS — Z7901 Long term (current) use of anticoagulants: Secondary | ICD-10-CM | POA: Insufficient documentation

## 2024-01-22 DIAGNOSIS — J432 Centrilobular emphysema: Secondary | ICD-10-CM | POA: Diagnosis not present

## 2024-01-22 DIAGNOSIS — I2510 Atherosclerotic heart disease of native coronary artery without angina pectoris: Secondary | ICD-10-CM | POA: Diagnosis not present

## 2024-01-22 DIAGNOSIS — Z66 Do not resuscitate: Secondary | ICD-10-CM | POA: Diagnosis not present

## 2024-01-22 DIAGNOSIS — Z950 Presence of cardiac pacemaker: Secondary | ICD-10-CM | POA: Insufficient documentation

## 2024-01-22 DIAGNOSIS — E785 Hyperlipidemia, unspecified: Secondary | ICD-10-CM | POA: Diagnosis not present

## 2024-01-22 DIAGNOSIS — R0602 Shortness of breath: Secondary | ICD-10-CM | POA: Diagnosis not present

## 2024-01-22 DIAGNOSIS — I495 Sick sinus syndrome: Secondary | ICD-10-CM | POA: Diagnosis not present

## 2024-01-22 HISTORY — PX: POLYPECTOMY: SHX149

## 2024-01-22 HISTORY — PX: COLONOSCOPY: SHX5424

## 2024-01-22 SURGERY — COLONOSCOPY
Anesthesia: General

## 2024-01-22 MED ORDER — LIDOCAINE HCL (PF) 2 % IJ SOLN
INTRAMUSCULAR | Status: AC
  Filled 2024-01-22: qty 5

## 2024-01-22 MED ORDER — SODIUM CHLORIDE 0.9 % IV SOLN: INTRAVENOUS | Status: DC

## 2024-01-22 MED ORDER — PROPOFOL 10 MG/ML IV BOLUS
INTRAVENOUS | Status: AC
  Filled 2024-01-22: qty 20

## 2024-01-22 MED ORDER — PROPOFOL 10 MG/ML IV BOLUS
INTRAVENOUS | Status: AC
Start: 2024-01-22 — End: 2024-01-22
  Filled 2024-01-22: qty 40

## 2024-01-22 MED ORDER — LIDOCAINE HCL (CARDIAC) PF 100 MG/5ML IV SOSY
PREFILLED_SYRINGE | INTRAVENOUS | Status: DC | PRN
  Administered 2024-01-22: 40 mg via INTRAVENOUS

## 2024-01-22 MED ORDER — PROPOFOL 500 MG/50ML IV EMUL
INTRAVENOUS | Status: DC | PRN
  Administered 2024-01-22: 80 mg via INTRAVENOUS
  Administered 2024-01-22: 100 ug/kg/min via INTRAVENOUS

## 2024-01-22 NOTE — Anesthesia Preprocedure Evaluation (Addendum)
 Anesthesia Evaluation  Patient identified by MRN, date of birth, ID band Patient awake    Reviewed: Allergy & Precautions, NPO status , Patient's Chart, lab work & pertinent test results  History of Anesthesia Complications Negative for: history of anesthetic complications  Airway Mallampati: III  TM Distance: >3 FB Neck ROM: full    Dental no notable dental hx.    Pulmonary shortness of breath and at rest, sleep apnea , COPD, former smoker   Pulmonary exam normal        Cardiovascular hypertension, On Medications + Peripheral Vascular Disease and +CHF  + dysrhythmias Atrial Fibrillation + pacemaker      Neuro/Psych  Neuromuscular disease  negative psych ROS   GI/Hepatic Neg liver ROS,GERD  ,,  Endo/Other  negative endocrine ROS    Renal/GU Renal disease  negative genitourinary   Musculoskeletal   Abdominal   Peds  Hematology negative hematology ROS (+)   Anesthesia Other Findings Past Medical History: No date: (HFpEF) heart failure with preserved ejection fraction (HCC)     Comment:  a. 05/2018 Echo: EF 55-60%, no rwma, mild to mod MR. Nl               RV fxn. Mod TR. PASP . No date: Arthritis     Comment:  knees, Hands No date: Arthritis of knee No date: Back pain No date: Carotid arterial disease (HCC)     Comment:  a. 03/2019 Carotid U/S: <50% bilat ICA stenoses. No date: CHF (congestive heart failure) (HCC) No date: Cholelithiasis     Comment:  a. 10/2018 noted on CT. No date: Edema, lower extremity No date: Fatty liver No date: GERD (gastroesophageal reflux disease) No date: History of stress test     Comment:  a. 06/2018 MV: EF 59%, no ischemia/infarct. Low risk. No date: Hypertension No date: Knee pain No date: Lactose intolerance No date: Mitral regurgitation     Comment:  a. 05/2018 Echo: mild to mod MR. No date: Multinodular goiter No date: Obesity No date: OSA (obstructive sleep  apnea) No date: PAF (paroxysmal atrial fibrillation) (HCC)     Comment:  a.  Diagnosed 12/19; b. 05/2018 s/p DCCV; c. 03/2019 &               05/2019 recurrent AFib-->managed w/ amio load; d.               CHADS2VASc = 6 (CHF, HTN, age x 2, vascular disease,               female)-->Eliquis  & amio 100 qd. No date: PAH (pulmonary artery hypertension) (HCC) 05/10/2022: RSV (acute bronchiolitis due to respiratory syncytial  virus) No date: Scoliosis No date: SOB (shortness of breath) No date: Swallowing difficulty  Past Surgical History: 07/05/2022: AV NODE ABLATION; N/A     Comment:  Procedure: AV NODE ABLATION;  Surgeon: Cindie Ole DASEN, MD;  Location: [facility];  Service:               Cardiovascular;  Laterality: N/A; 06/15/2018: CARDIOVERSION; N/A     Comment:  Procedure: CARDIOVERSION (CATH LAB);  Surgeon: Perla Evalene PARAS, MD;  Location: [facility];  Service:               Cardiovascular;  Laterality: N/A; 02/18/2021: CARDIOVERSION; N/A  Comment:  Procedure: CARDIOVERSION;  Surgeon: Perla Evalene PARAS,               MD;  Location: [facility];  Service: Cardiovascular;                Laterality: N/A; 01/25/2016: CATARACT EXTRACTION W/PHACO; Right     Comment:  Procedure: CATARACT EXTRACTION PHACO AND INTRAOCULAR               LENS PLACEMENT (IOC);  Surgeon: Donzell Arlyce Budd,              MD;  Location: [facility];  Service:               Ophthalmology;  Laterality: Right;  RIGHT 02/22/2016: CATARACT EXTRACTION W/PHACO; Left     Comment:  Procedure: CATARACT EXTRACTION PHACO AND INTRAOCULAR               LENS PLACEMENT (IOC);  Surgeon: Donzell Arlyce Budd,              MD;  Location: [facility];  Service:               Ophthalmology;  Laterality: Left;  LEFT 05/16/2008: HAMMER TOE SURGERY 05/16/2002: KNEE ARTHROSCOPY; Right 10/25/2021: PACEMAKER IMPLANT; N/A     Comment:  Procedure: PACEMAKER IMPLANT;   Surgeon: Cindie Ole DASEN, MD;  Location: [facility];  Service:               Cardiovascular;  Laterality: N/A; 12/17/2021: PARTIAL HIP ARTHROPLASTY; Left 05/17/2007: REPLACEMENT TOTAL KNEE; Right     Comment:  [facility] 04/08/2013: SKIN GRAFT; Left     Comment:  Done on left index finger 05/16/1944: TONSILLECTOMY 12/02/2020: TOTAL KNEE ARTHROPLASTY; Left     Reproductive/Obstetrics negative OB ROS                              Anesthesia Physical Anesthesia Plan  ASA: 3  Anesthesia Plan: General   Post-op Pain Management:    Induction: Intravenous  PONV Risk Score and Plan: Propofol  infusion and TIVA  Airway Management Planned: Natural Airway and Nasal Cannula  Additional Equipment:   Intra-op Plan:   Post-operative Plan:   Informed Consent: I have reviewed the patients History and Physical, chart, labs and discussed the procedure including the risks, benefits and alternatives for the proposed anesthesia with the patient or authorized representative who has indicated his/her understanding and acceptance.     Dental Advisory Given  Plan Discussed with: Anesthesiologist, CRNA and Surgeon  Anesthesia Plan Comments: (Patient consented for risks of anesthesia including but not limited to:  - adverse reactions to medications - risk of airway placement if required - damage to eyes, teeth, lips or other oral mucosa - nerve damage due to positioning  - sore throat or hoarseness - Damage to heart, brain, nerves, lungs, other parts of body or loss of life  Patient voiced understanding and assent.)         Anesthesia Quick Evaluation

## 2024-01-22 NOTE — Transfer of Care (Signed)
 Immediate Anesthesia Transfer of Care Note  Patient: Lauren Lloyd  Procedure(s) Performed: COLONOSCOPY  Patient Location: Endoscopy Unit  Anesthesia Type:General  Level of Consciousness: drowsy  Airway & Oxygen Therapy: Patient Spontanous Breathing  Post-op Assessment: Report given to RN and Post -op Vital signs reviewed and stable  Post vital signs: Reviewed and stable  Last Vitals:  Vitals Value Taken Time  BP 105/69 0908  Temp 35.7 0908  Pulse 72 0908  Resp 18 0908  SpO2 96 0908    Last Pain:  Vitals:   01/22/24 0807  PainSc: 0-No pain         Complications: No notable events documented.

## 2024-01-22 NOTE — Anesthesia Postprocedure Evaluation (Signed)
 Anesthesia Post Note  Patient: Lauren Lloyd  Procedure(s) Performed: COLONOSCOPY  Patient location during evaluation: Endoscopy Anesthesia Type: General Level of consciousness: awake and alert Pain management: pain level controlled Vital Signs Assessment: post-procedure vital signs reviewed and stable Respiratory status: spontaneous breathing, nonlabored ventilation, respiratory function stable and patient connected to nasal cannula oxygen Cardiovascular status: blood pressure returned to baseline and stable Postop Assessment: no apparent nausea or vomiting Anesthetic complications: no   No notable events documented.   Last Vitals:  Vitals:   01/22/24 0920 01/22/24 0929  BP: (!) 144/73 (!) 154/73  Pulse: 69 70  Resp: 19 18  Temp:    SpO2: 97% 97%    Last Pain:  Vitals:   01/22/24 0929  TempSrc:   PainSc: 0-No pain                 [name]

## 2024-01-22 NOTE — H&P (Signed)
 Ruel Kung , MD [address], [address], [place], [place], [address] Phone: [phone] Fax: [phone]  Primary Care Physician:  Myrla Jon HERO, MD   Pre-Procedure History & Physical: HPI:  Lauren Lloyd is a 83 y.o. female is here for an colonoscopy.   Past Medical History:  Diagnosis Date   (HFpEF) heart failure with preserved ejection fraction (HCC)    a. 05/2018 Echo: EF 55-60%, no rwma, mild to mod MR. Nl RV fxn. Mod TR. PASP .   Arthritis    knees, Hands   Arthritis of knee    Back pain    Carotid arterial disease (HCC)    a. 03/2019 Carotid U/S: <50% bilat ICA stenoses.   CHF (congestive heart failure) (HCC)    Cholelithiasis    a. 10/2018 noted on CT.   Edema, lower extremity    Fatty liver    GERD (gastroesophageal reflux disease)    History of stress test    a. 06/2018 MV: EF 59%, no ischemia/infarct. Low risk.   Hypertension    Knee pain    Lactose intolerance    Mitral regurgitation    a. 05/2018 Echo: mild to mod MR.   Multinodular goiter    Obesity    OSA (obstructive sleep apnea)    PAF (paroxysmal atrial fibrillation) (HCC)    a.  Diagnosed 12/19; b. 05/2018 s/p DCCV; c. 03/2019 & 05/2019 recurrent AFib-->managed w/ amio load; d. CHADS2VASc = 6 (CHF, HTN, age x 2, vascular disease, female)-->Eliquis  & amio 100 qd.   PAH (pulmonary artery hypertension) (HCC)    RSV (acute bronchiolitis due to respiratory syncytial virus) 05/10/2022   Scoliosis    SOB (shortness of breath)    Swallowing difficulty     Past Surgical History:  Procedure Laterality Date   AV NODE ABLATION N/A 07/05/2022   Procedure: AV NODE ABLATION;  Surgeon: Cindie Ole DASEN, MD;  [facility]: [facility];  Service: Cardiovascular;  Laterality: N/A;   CARDIOVERSION N/A 06/15/2018   Procedure: CARDIOVERSION (CATH LAB);  Surgeon: Perla Evalene PARAS, MD;  Location: [facility];  Service: Cardiovascular;  Laterality: N/A;   CARDIOVERSION N/A 02/18/2021   Procedure: CARDIOVERSION;  Surgeon: Perla Evalene PARAS, MD;  Location: [facility];  Service: Cardiovascular;  Laterality: N/A;   CATARACT EXTRACTION W/PHACO Right 01/25/2016   Procedure: CATARACT EXTRACTION PHACO AND INTRAOCULAR LENS PLACEMENT (IOC);  Surgeon: Donzell Arlyce Budd, MD;  Location: [facility];  Service: Ophthalmology;  Laterality: Right;  RIGHT   CATARACT EXTRACTION W/PHACO Left 02/22/2016   Procedure: CATARACT EXTRACTION PHACO AND INTRAOCULAR LENS PLACEMENT (IOC);  Surgeon: Donzell Arlyce Budd, MD;  Location: [facility];  Service: Ophthalmology;  Laterality: Left;  LEFT   HAMMER TOE SURGERY  05/16/2008   KNEE ARTHROSCOPY Right 05/16/2002   PACEMAKER IMPLANT N/A 10/25/2021   Procedure: PACEMAKER IMPLANT;  Surgeon: Cindie Ole DASEN, MD;  Location: [facility];  Service: Cardiovascular;  Laterality: N/A;   PARTIAL HIP ARTHROPLASTY Left 12/17/2021   REPLACEMENT TOTAL KNEE Right 05/17/2007   [facility]   SKIN GRAFT Left 04/08/2013   Done on left index finger   TONSILLECTOMY  05/16/1944   TOTAL KNEE ARTHROPLASTY Left 12/02/2020    Prior to Admission medications   Medication Sig Start Date End Date Taking? Authorizing Provider  ELIQUIS  5 MG TABS tablet TAKE ONE TABLET TWICE DAILY 10/02/23  Yes Gollan, Timothy J, MD  allopurinol  (ZYLOPRIM ) 100 MG tablet TAKE ONE  TABLET BY MOUTH EVERY DAY 12/04/23   Bacigalupo, Jon HERO, MD  calcium  carbonate (TUMS EX) 750 MG chewable tablet Chew 2 tablets by mouth daily as needed for heartburn. 12/07/20   [provider]  Cholecalciferol  25 MCG (1000 UT) tablet Take 1,000 Units by mouth daily.    [provider]  dapagliflozin  propanediol (FARXIGA ) 10 MG TABS tablet Take 1 tablet (10 mg total) by mouth daily before breakfast. 06/05/23   Zenaida Morene PARAS, MD  ezetimibe  (ZETIA )  10 MG tablet TAKE 1 TABLET BY MOUTH DAILY 12/04/23   Bacigalupo, Angela M, MD  furosemide  (LASIX ) 40 MG tablet TAKE 1 TABLET BY MOUTH DAILY. TAKE AN EXTRA TABLET AS NEEDED AFTER LUNCH FOR ABDOMINAL SWELLING, LEG SWELLING OR SHORTNESS OF BREATH 05/29/23   Gollan, Timothy J, MD  gabapentin  (NEURONTIN ) 600 MG tablet Take 1 tablet (600 mg total) by mouth at bedtime. 01/11/24   Bacigalupo, Angela M, MD  lactobacillus acidophilus (BACID) TABS tablet Take 2 tablets by mouth 3 (three) times daily. Patient not taking: Reported on 01/17/2024    [provider]  Magnesium  300 MG CAPS Take by mouth.    [provider]  Menthol, Topical Analgesic, (BIOFREEZE EX) Apply 1 application. topically daily as needed (Neck pain).    [provider]  metaxalone  (SKELAXIN ) 800 MG tablet Take 1 tablet (800 mg total) by mouth daily as needed for muscle spasms. 03/20/23   Bacigalupo, Angela M, MD  Multiple Vitamin (MULTIVITAMIN) capsule Take 1 capsule by mouth daily.    [provider]  mupirocin  ointment (BACTROBAN ) 2 % Place 1 application  into the nose 2 (two) times daily. 10/26/21   Lesia Ozell Barter, PA-C  potassium chloride  (KLOR-CON ) 10 MEQ tablet TAKE 1 TABLET BY MOUTH DAILY 01/19/24   Gollan, Timothy J, MD  Tafamidis  (VYNDAMAX ) 61 MG CAPS Take 1 capsule (61 mg total) by mouth daily. 07/27/23   Zenaida Morene PARAS, MD  Tiotropium Bromide -Olodaterol (STIOLTO RESPIMAT ) 2.5-2.5 MCG/ACT AERS Inhale 2 puffs into the lungs daily. 10/26/22   Hunsucker, Donnice SAUNDERS, MD    Allergies as of 11/24/2023 - Review Complete 11/10/2023  Allergen Reaction Noted   Levofloxacin Other (See Comments) 11/18/2014   Influenza vaccine recombinant Other (See Comments) 12/17/2021   Influenza vaccines Other (See Comments) 03/07/2016   Oysters [shellfish allergy] Swelling 11/24/2016    Family History  Problem Relation Age of Onset   Hyperlipidemia Sister    Atrial fibrillation Sister    Transient ischemic  attack Mother    Heart disease Mother    Heart attack Father    High blood pressure Father    Stroke Father    Healthy Brother    Breast cancer Sister 16   Hyperlipidemia Sister    Atrial fibrillation Sister     Social History   Socioeconomic History   Marital status: Single    Spouse name: Not on file   Number of children: 5   Years of education: college   Highest education level: Bachelor's degree (e.g., BA, AB, BS)  Occupational History   Occupation: Lawyer: [employer]  Tobacco Use   Smoking status: Former    Current packs/day: 0.00    Average packs/day: 1 pack/day for 30.0 years (30.0 ttl pk-yrs)    Types: Cigarettes    Start date: 05/17/1959    Quit date: 05/16/1989    Years since quitting: 34.7    Passive exposure: Past   Smokeless tobacco: Never  Vaping Use   Vaping status: Never Used  Substance and Sexual Activity   Alcohol use: Yes    Alcohol/week: 14.0 standard drinks of alcohol    Types: 14 Glasses of wine per week   Drug use: No   Sexual activity: Not Currently  Other Topics Concern   Not on file  Social History Narrative   Pt has a child who passed away at age 58   Social Drivers of Health   Financial Resource Strain: Low Risk  (01/17/2024)   Overall Financial Resource Strain (CARDIA)    Difficulty of Paying Living Expenses: Not hard at all  Food Insecurity: No Food Insecurity (01/17/2024)   Hunger Vital Sign    Worried About Running Out of Food in the Last Year: Never true    Ran Out of Food in the Last Year: Never true  Transportation Needs: No Transportation Needs (01/17/2024)   PRAPARE - Administrator, Civil Service (Medical): No    Lack of Transportation (Non-Medical): No  Physical Activity: Inactive (01/17/2024)   Exercise Vital Sign    Days of Exercise per Week: 0 days    Minutes of Exercise per Session: 0 min  Stress: No Stress Concern Present (01/17/2024)   [employer] of Occupational Health -  Occupational Stress Questionnaire    Feeling of Stress: Not at all  Social Connections: Moderately Integrated (01/17/2024)   Social Connection and Isolation Panel    Frequency of Communication with Friends and Family: More than three times a week    Frequency of Social Gatherings with Friends and Family: More than three times a week    Attends Religious Services: More than 4 times per year    Active Member of [employer] or Organizations: Yes    Attends Engineer, structural: More than 4 times per year    Marital Status: Divorced  Intimate Partner Violence: Not At Risk (01/17/2024)   Humiliation, Afraid, Rape, and Kick questionnaire    Fear of Current or Ex-Partner: No    Emotionally Abused: No    Physically Abused: No    Sexually Abused: No    Review of Systems: See HPI, otherwise negative ROS  Physical Exam: BP (!) 176/77   Pulse 76   Temp (!) 96.2 F (35.7 C)   Resp 18   Ht 5' 7 (1.702 m)   Wt 97.5 kg   SpO2 100%   BMI 33.67 kg/m  General:   Alert,  pleasant and cooperative in NAD Head:  Normocephalic and atraumatic. Neck:  Supple; no masses or thyromegaly. Lungs:  Clear throughout to auscultation, normal respiratory effort.    Heart:  +S1, +S2, Regular rate and rhythm, No edema. Abdomen:  Soft, nontender and nondistended. Normal bowel sounds, without guarding, and without rebound.   Neurologic:  Alert and  oriented x4;  grossly normal neurologically.  Impression/Plan: Lauren Lloyd is here for an colonoscopy to be performed for recent history of diverticulitis.    Risks, benefits, limitations, and alternatives regarding  colonoscopy have been reviewed with the patient.  Questions have been answered.  All parties agreeable.   Ruel Kung, MD  01/22/2024, 8:09 AM \

## 2024-01-22 NOTE — Op Note (Signed)
 [facility]astroenterology Patient Name: Lauren Lloyd Procedure Date: 01/22/2024 8:40 AM MRN: [id number] Account #: [id number] Date of Birth: [birth year] Admit Type: Outpatient Age: 83 Room: [facility] ENDO ROOM 1 Gender: Female Note Status: Finalized Instrument Name: Peds Colonoscope [id number] Procedure:             Colonoscopy Indications:           Follow-up of diverticulitis Providers:             Ruel Kung MD, MD Referring MD:          Jon HERO. Bacigalupo (Referring MD) Medicines:             Monitored Anesthesia Care Complications:         No immediate complications. Procedure:             Pre-Anesthesia Assessment:                        - Prior to the procedure, a History and Physical was                         performed, and patient medications, allergies and                         sensitivities were reviewed. [name] tolerance                         of previous anesthesia was reviewed.                        - The risks and benefits of the procedure and the                         sedation options and risks were discussed with the                         patient. All questions were answered and informed                         consent was obtained.                        - ASA Grade Assessment: II - A patient with mild                         systemic disease.                        After obtaining informed consent, the colonoscope was                         passed under direct vision. Throughout the procedure,                         the patient's blood pressure, pulse, and oxygen                         saturations were monitored continuously. The                         Colonoscope was introduced through  the anus and                         advanced to the the cecum, identified by the                         appendiceal orifice. The colonoscopy was performed                         with ease. The patient tolerated the procedure well.                          The quality of the bowel preparation was excellent.                         The ileocecal valve, appendiceal orifice, and rectum                         were photographed. Findings:      The perianal and digital rectal examinations were normal.      Two sessile polyps were found in the sigmoid colon and cecum. The polyps       were 4 to 5 mm in size. These polyps were removed with a cold snare.       Resection and retrieval were complete.      Multiple medium-mouthed diverticula were found in the sigmoid colon.      The exam was otherwise without abnormality on direct and retroflexion       views. Impression:            - Two 4 to 5 mm polyps in the sigmoid colon and in the                         cecum, removed with a cold snare. Resected and                         retrieved.                        - Diverticulosis in the sigmoid colon.                        - The examination was otherwise normal on direct and                         retroflexion views. Recommendation:        - Discharge patient to home (with escort).                        - Resume previous diet.                        - Continue present medications.                        - Await pathology results.                        - Repeat colonoscopy is not recommended due to current  age (15 years or older) for surveillance. Procedure Code(s):     --- Professional ---                        [phone], Colonoscopy, flexible; with removal of                         tumor(s), polyp(s), or other lesion(s) by snare                         technique Diagnosis Code(s):     --- Professional ---                        D12.5, Benign neoplasm of sigmoid colon                        D12.0, Benign neoplasm of cecum                        K57.32, Diverticulitis of large intestine without                         perforation or abscess without bleeding                        K57.30, Diverticulosis of large  intestine without                         perforation or abscess without bleeding CPT copyright 2022 American Medical Association. All rights reserved. The codes documented in this report are preliminary and upon coder review may  be revised to meet current compliance requirements. Ruel Kung, MD Ruel Kung MD, MD 01/22/2024 9:08:30 AM This report has been signed electronically. Number of Addenda: 0 Note Initiated On: 01/22/2024 8:40 AM Scope Withdrawal Time: 0 hours 6 minutes 49 seconds  Total Procedure Duration: 0 hours 8 minutes 58 seconds  Estimated Blood Loss:  Estimated blood loss: none.      [facility]

## 2024-01-23 ENCOUNTER — Other Ambulatory Visit: Payer: Self-pay

## 2024-01-23 ENCOUNTER — Inpatient Hospital Stay
Admission: EM | Admit: 2024-01-23 | Discharge: 2024-01-26 | DRG: 281 | Disposition: A | Discharge status: Home / Self Care | Attending: Internal Medicine | Admitting: Internal Medicine

## 2024-01-23 ENCOUNTER — Emergency Department

## 2024-01-23 DIAGNOSIS — I480 Paroxysmal atrial fibrillation: Secondary | ICD-10-CM | POA: Diagnosis not present

## 2024-01-23 DIAGNOSIS — D125 Benign neoplasm of sigmoid colon: Secondary | ICD-10-CM | POA: Diagnosis present

## 2024-01-23 DIAGNOSIS — I2510 Atherosclerotic heart disease of native coronary artery without angina pectoris: Secondary | ICD-10-CM | POA: Diagnosis present

## 2024-01-23 DIAGNOSIS — E785 Hyperlipidemia, unspecified: Secondary | ICD-10-CM | POA: Diagnosis not present

## 2024-01-23 DIAGNOSIS — I214 Non-ST elevation (NSTEMI) myocardial infarction: Secondary | ICD-10-CM | POA: Diagnosis not present

## 2024-01-23 DIAGNOSIS — G629 Polyneuropathy, unspecified: Secondary | ICD-10-CM | POA: Diagnosis present

## 2024-01-23 DIAGNOSIS — K5732 Diverticulitis of large intestine without perforation or abscess without bleeding: Secondary | ICD-10-CM | POA: Diagnosis present

## 2024-01-23 DIAGNOSIS — K760 Fatty (change of) liver, not elsewhere classified: Secondary | ICD-10-CM | POA: Diagnosis present

## 2024-01-23 DIAGNOSIS — E66811 Obesity, class 1: Secondary | ICD-10-CM | POA: Diagnosis present

## 2024-01-23 DIAGNOSIS — I4821 Permanent atrial fibrillation: Secondary | ICD-10-CM | POA: Diagnosis not present

## 2024-01-23 DIAGNOSIS — I472 Ventricular tachycardia, unspecified: Secondary | ICD-10-CM | POA: Diagnosis not present

## 2024-01-23 DIAGNOSIS — Z7902 Long term (current) use of antithrombotics/antiplatelets: Secondary | ICD-10-CM

## 2024-01-23 DIAGNOSIS — I2721 Secondary pulmonary arterial hypertension: Secondary | ICD-10-CM | POA: Diagnosis present

## 2024-01-23 DIAGNOSIS — I110 Hypertensive heart disease with heart failure: Secondary | ICD-10-CM | POA: Diagnosis present

## 2024-01-23 DIAGNOSIS — R0602 Shortness of breath: Secondary | ICD-10-CM | POA: Diagnosis not present

## 2024-01-23 DIAGNOSIS — G4733 Obstructive sleep apnea (adult) (pediatric): Secondary | ICD-10-CM | POA: Diagnosis present

## 2024-01-23 DIAGNOSIS — J432 Centrilobular emphysema: Secondary | ICD-10-CM | POA: Diagnosis present

## 2024-01-23 DIAGNOSIS — Z66 Do not resuscitate: Secondary | ICD-10-CM | POA: Diagnosis present

## 2024-01-23 DIAGNOSIS — Z87891 Personal history of nicotine dependence: Secondary | ICD-10-CM

## 2024-01-23 DIAGNOSIS — I447 Left bundle-branch block, unspecified: Secondary | ICD-10-CM | POA: Diagnosis present

## 2024-01-23 DIAGNOSIS — Z823 Family history of stroke: Secondary | ICD-10-CM

## 2024-01-23 DIAGNOSIS — Z91013 Allergy to seafood: Secondary | ICD-10-CM

## 2024-01-23 DIAGNOSIS — I495 Sick sinus syndrome: Secondary | ICD-10-CM | POA: Diagnosis present

## 2024-01-23 DIAGNOSIS — Z7901 Long term (current) use of anticoagulants: Secondary | ICD-10-CM | POA: Diagnosis not present

## 2024-01-23 DIAGNOSIS — Z6833 Body mass index (BMI) 33.0-33.9, adult: Secondary | ICD-10-CM | POA: Diagnosis not present

## 2024-01-23 DIAGNOSIS — Z8249 Family history of ischemic heart disease and other diseases of the circulatory system: Secondary | ICD-10-CM

## 2024-01-23 DIAGNOSIS — D472 Monoclonal gammopathy: Secondary | ICD-10-CM | POA: Diagnosis present

## 2024-01-23 DIAGNOSIS — I5032 Chronic diastolic (congestive) heart failure: Secondary | ICD-10-CM | POA: Diagnosis not present

## 2024-01-23 DIAGNOSIS — E739 Lactose intolerance, unspecified: Secondary | ICD-10-CM | POA: Diagnosis present

## 2024-01-23 DIAGNOSIS — M109 Gout, unspecified: Secondary | ICD-10-CM | POA: Diagnosis present

## 2024-01-23 DIAGNOSIS — Z83438 Family history of other disorder of lipoprotein metabolism and other lipidemia: Secondary | ICD-10-CM

## 2024-01-23 DIAGNOSIS — R079 Chest pain, unspecified: Secondary | ICD-10-CM | POA: Diagnosis not present

## 2024-01-23 DIAGNOSIS — Z803 Family history of malignant neoplasm of breast: Secondary | ICD-10-CM

## 2024-01-23 DIAGNOSIS — Z723 Lack of physical exercise: Secondary | ICD-10-CM

## 2024-01-23 DIAGNOSIS — I340 Nonrheumatic mitral (valve) insufficiency: Secondary | ICD-10-CM | POA: Diagnosis present

## 2024-01-23 DIAGNOSIS — Z96653 Presence of artificial knee joint, bilateral: Secondary | ICD-10-CM | POA: Diagnosis present

## 2024-01-23 DIAGNOSIS — Z881 Allergy status to other antibiotic agents status: Secondary | ICD-10-CM

## 2024-01-23 DIAGNOSIS — I272 Pulmonary hypertension, unspecified: Secondary | ICD-10-CM | POA: Diagnosis not present

## 2024-01-23 DIAGNOSIS — Z79899 Other long term (current) drug therapy: Secondary | ICD-10-CM | POA: Diagnosis not present

## 2024-01-23 DIAGNOSIS — Z950 Presence of cardiac pacemaker: Secondary | ICD-10-CM

## 2024-01-23 DIAGNOSIS — D120 Benign neoplasm of cecum: Secondary | ICD-10-CM | POA: Diagnosis present

## 2024-01-23 DIAGNOSIS — I517 Cardiomegaly: Secondary | ICD-10-CM | POA: Diagnosis not present

## 2024-01-23 DIAGNOSIS — Z887 Allergy status to serum and vaccine status: Secondary | ICD-10-CM

## 2024-01-23 DIAGNOSIS — Z96642 Presence of left artificial hip joint: Secondary | ICD-10-CM | POA: Diagnosis present

## 2024-01-23 DIAGNOSIS — J449 Chronic obstructive pulmonary disease, unspecified: Secondary | ICD-10-CM

## 2024-01-23 LAB — BASIC METABOLIC PANEL WITH GFR
Anion gap: 11 (ref 5–15)
BUN: 25 mg/dL — ABNORMAL HIGH (ref 8–23)
CO2: 27 mmol/L (ref 22–32)
Calcium: 9.4 mg/dL (ref 8.9–10.3)
Chloride: 102 mmol/L (ref 98–111)
Creatinine, Ser: 1 mg/dL (ref 0.44–1.00)
GFR, Estimated: 56 mL/min — ABNORMAL LOW (ref >60)
Glucose, Bld: 139 mg/dL — ABNORMAL HIGH (ref 70–99)
Potassium: 3.2 mmol/L — ABNORMAL LOW (ref 3.5–5.1)
Sodium: 140 mmol/L (ref 135–145)

## 2024-01-23 LAB — CBC
HCT: 45.7 % (ref 36.0–46.0)
Hemoglobin: 15.1 g/dL — ABNORMAL HIGH (ref 12.0–15.0)
MCH: 31.1 pg (ref 26.0–34.0)
MCHC: 33 g/dL (ref 30.0–36.0)
MCV: 94.2 fL (ref 80.0–100.0)
Platelets: 247 K/uL (ref 150–400)
RBC: 4.85 MIL/uL (ref 3.87–5.11)
RDW: 13.7 % (ref 11.5–15.5)
WBC: 9 K/uL (ref 4.0–10.5)
nRBC: 0 % (ref 0.0–0.2)

## 2024-01-23 LAB — TROPONIN I (HIGH SENSITIVITY)
Troponin I (High Sensitivity): 799 ng/L (ref <18)
Troponin I (High Sensitivity): 5728 ng/L (ref <18)

## 2024-01-23 LAB — PROTIME-INR
INR: 1 (ref 0.8–1.2)
Prothrombin Time: 13.7 s (ref 11.4–15.2)

## 2024-01-23 LAB — APTT: aPTT: 30 s (ref 24–36)

## 2024-01-23 LAB — SURGICAL PATHOLOGY

## 2024-01-23 MED ORDER — ADULT MULTIVITAMIN W/MINERALS CH
1.0000 | ORAL_TABLET | Freq: Every day | ORAL | Status: DC
  Administered 2024-01-24 – 2024-01-26 (×3): 1 via ORAL
  Filled 2024-01-23 (×4): qty 1

## 2024-01-23 MED ORDER — RISAQUAD PO CAPS
1.0000 | ORAL_CAPSULE | Freq: Three times a day (TID) | ORAL | Status: DC
  Administered 2024-01-24 – 2024-01-26 (×6): 1 via ORAL
  Filled 2024-01-23 (×6): qty 1

## 2024-01-23 MED ORDER — HEPARIN BOLUS VIA INFUSION: 4000.0000 [IU] | Freq: Once | INTRAVENOUS | Status: DC

## 2024-01-23 MED ORDER — CALCIUM CARBONATE ANTACID 500 MG PO CHEW: 1500.0000 mg | CHEWABLE_TABLET | Freq: Every day | ORAL | Status: DC | PRN

## 2024-01-23 MED ORDER — HEPARIN (PORCINE) 25000 UT/250ML-% IV SOLN
1150.0000 [IU]/h | INTRAVENOUS | Status: DC
  Administered 2024-01-23: 1000 [IU]/h via INTRAVENOUS
  Administered 2024-01-24: 1150 [IU]/h via INTRAVENOUS
  Filled 2024-01-23 (×2): qty 250

## 2024-01-23 MED ORDER — VITAMIN D 25 MCG (1000 UNIT) PO TABS
1000.0000 [IU] | ORAL_TABLET | Freq: Every day | ORAL | Status: DC
  Administered 2024-01-24 – 2024-01-26 (×3): 1000 [IU] via ORAL
  Filled 2024-01-23 (×3): qty 1

## 2024-01-23 MED ORDER — DAPAGLIFLOZIN PROPANEDIOL 10 MG PO TABS
10.0000 mg | ORAL_TABLET | Freq: Every day | ORAL | Status: DC
  Administered 2024-01-24 – 2024-01-26 (×3): 10 mg via ORAL
  Filled 2024-01-23 (×4): qty 1

## 2024-01-23 MED ORDER — TAFAMIDIS 61 MG PO CAPS
61.0000 mg | ORAL_CAPSULE | Freq: Every day | ORAL | Status: DC
  Administered 2024-01-24 – 2024-01-26 (×3): 61 mg via ORAL
  Filled 2024-01-23 (×3): qty 1

## 2024-01-23 MED ORDER — SODIUM CHLORIDE 0.9 % IV SOLN: INTRAVENOUS | Status: DC

## 2024-01-23 MED ORDER — ASPIRIN 81 MG PO CHEW
324.0000 mg | CHEWABLE_TABLET | ORAL | Status: AC
Start: 2024-01-23 — End: 2024-01-24
  Administered 2024-01-24: 324 mg via ORAL
  Filled 2024-01-23: qty 4

## 2024-01-23 MED ORDER — TRAZODONE HCL 50 MG PO TABS: 25.0000 mg | ORAL_TABLET | Freq: Every evening | ORAL | Status: DC | PRN

## 2024-01-23 MED ORDER — EZETIMIBE 10 MG PO TABS
10.0000 mg | ORAL_TABLET | Freq: Every day | ORAL | Status: DC
Start: 2024-01-24 — End: 2024-01-26
  Administered 2024-01-24 – 2024-01-26 (×3): 10 mg via ORAL
  Filled 2024-01-23 (×3): qty 1

## 2024-01-23 MED ORDER — POTASSIUM CHLORIDE CRYS ER 10 MEQ PO TBCR
10.0000 meq | EXTENDED_RELEASE_TABLET | Freq: Every day | ORAL | Status: DC
  Administered 2024-01-24 – 2024-01-26 (×3): 10 meq via ORAL
  Filled 2024-01-23 (×4): qty 1

## 2024-01-23 MED ORDER — ASPIRIN 300 MG RE SUPP
300.0000 mg | RECTAL | Status: AC
Start: 2024-01-23 — End: 2024-01-24

## 2024-01-23 MED ORDER — MAGNESIUM HYDROXIDE 400 MG/5ML PO SUSP: 30.0000 mL | Freq: Every day | ORAL | Status: DC | PRN

## 2024-01-23 MED ORDER — ALPRAZOLAM 0.25 MG PO TABS
0.2500 mg | ORAL_TABLET | Freq: Two times a day (BID) | ORAL | Status: DC | PRN
  Filled 2024-01-23: qty 1

## 2024-01-23 MED ORDER — NITROGLYCERIN 0.4 MG SL SUBL: 0.4000 mg | SUBLINGUAL_TABLET | SUBLINGUAL | Status: DC | PRN

## 2024-01-23 MED ORDER — ASPIRIN 81 MG PO CHEW
324.0000 mg | CHEWABLE_TABLET | Freq: Once | ORAL | Status: AC
  Administered 2024-01-23: 324 mg via ORAL
  Filled 2024-01-23: qty 4

## 2024-01-23 MED ORDER — ALLOPURINOL 100 MG PO TABS
100.0000 mg | ORAL_TABLET | Freq: Every day | ORAL | Status: DC
Start: 2024-01-24 — End: 2024-01-26
  Administered 2024-01-24 – 2024-01-26 (×3): 100 mg via ORAL
  Filled 2024-01-23 (×4): qty 1

## 2024-01-23 MED ORDER — HEPARIN BOLUS VIA INFUSION
4000.0000 [IU] | Freq: Once | INTRAVENOUS | Status: AC
  Administered 2024-01-23: 4000 [IU] via INTRAVENOUS
  Filled 2024-01-23: qty 4000

## 2024-01-23 MED ORDER — METAXALONE 800 MG PO TABS: 800.0000 mg | ORAL_TABLET | Freq: Every day | ORAL | Status: DC | PRN

## 2024-01-23 MED ORDER — FUROSEMIDE 40 MG PO TABS
40.0000 mg | ORAL_TABLET | Freq: Every day | ORAL | Status: DC
  Administered 2024-01-24 – 2024-01-26 (×3): 40 mg via ORAL
  Filled 2024-01-23 (×3): qty 1

## 2024-01-23 MED ORDER — GABAPENTIN 300 MG PO CAPS
600.0000 mg | ORAL_CAPSULE | Freq: Every day | ORAL | Status: DC
  Administered 2024-01-24 – 2024-01-25 (×3): 600 mg via ORAL
  Filled 2024-01-23 (×3): qty 2

## 2024-01-23 MED ORDER — ACETAMINOPHEN 325 MG PO TABS
650.0000 mg | ORAL_TABLET | ORAL | Status: DC | PRN
  Administered 2024-01-24 (×2): 650 mg via ORAL
  Filled 2024-01-23 (×2): qty 2

## 2024-01-23 MED ORDER — ASPIRIN 81 MG PO TBEC
81.0000 mg | DELAYED_RELEASE_TABLET | Freq: Every day | ORAL | Status: DC
  Administered 2024-01-24 – 2024-01-26 (×2): 81 mg via ORAL
  Filled 2024-01-23 (×3): qty 1

## 2024-01-23 MED ORDER — ONDANSETRON HCL 4 MG/2ML IJ SOLN: 4.0000 mg | Freq: Four times a day (QID) | INTRAMUSCULAR | Status: DC | PRN

## 2024-01-23 NOTE — ED Notes (Signed)
 Called ccmd for monitoring

## 2024-01-23 NOTE — ED Triage Notes (Signed)
 Pt reports chest pain that began earlier today, pt has pacemaker placed back in 2023 for a fib. Pt reports taking meds for indigestion with no relief. Pt reports some nausea and diaphoresis with the pain as well.

## 2024-01-23 NOTE — ED Notes (Signed)
 Lab reports troponin results 799; acuity level changed and pt taken to room 7 via w/c by EDT Lauren to be placed on card monitor for further evaluation; care nurse [facility] RN called with report

## 2024-01-23 NOTE — H&P (Signed)
 [place]   PATIENT NAME: Lauren Lloyd    MR#:  [id number]  DATE OF BIRTH:  December 14, 1940  DATE OF ADMISSION:  01/23/2024  PRIMARY CARE PHYSICIAN: Myrla Jon HERO, MD   Patient is coming from: Home  REQUESTING/REFERRING PHYSICIAN: Clarine Sharper, MD  CHIEF COMPLAINT:   Chief Complaint  Patient presents with   Chest Pain    HISTORY OF PRESENT ILLNESS:  Lauren Lloyd is a 83 y.o. Caucasian female with medical history significant for osteoarthritis, carotid artery disease, diastolic CHF, cholelithiasis, GERD, essential hypertension, OSA and paroxysmal atrial fibrillation, who presented to the emergency room with acute onset of midsternal chest pain felt chest pressure and graded 5/10 in severity with associated diaphoresis without nausea or vomiting.  It started at 5 PM.  She denied any associated palpitations.  She has chronic dyspnea which has now worsened.  She was having malaise.  No fever or chills.  No cough or wheezing.  No dysuria, oliguria or hematuria or flank pain.  No bleeding diathesis.  ED Course: When the patient came to the ER, BP was 176/77 with temperature 96.2 and otherwise normal vital signs.  Labs revealed glucose of 109 with otherwise normal BMP.  Her high-sensitivity troponin I was 799 and later 5728.  LDL was 119 and total cholesterol was 189 on her lipid profile with HDL of 60.  CBC was normal.  Coag profile was normal. EKG as reviewed by me : EKG showed ventricular paced rhythm with a rate 75. Imaging: Portable chest x-ray showed cardiomegaly with no acute cardiopulmonary disease.  The patient was given IV heparin  bolus and drip and 4 baby aspirin .  She will be admitted to the progressive unit bed for further evaluation and management. PAST MEDICAL HISTORY:   Past Medical History:  Diagnosis Date   (HFpEF) heart failure with preserved ejection fraction (HCC)    a. 05/2018 Echo: EF 55-60%, no rwma, mild to mod MR. Nl RV fxn. Mod TR. PASP .    Arthritis    knees, Hands   Arthritis of knee    Back pain    Carotid arterial disease (HCC)    a. 03/2019 Carotid U/S: <50% bilat ICA stenoses.   CHF (congestive heart failure) (HCC)    Cholelithiasis    a. 10/2018 noted on CT.   Edema, lower extremity    Fatty liver    GERD (gastroesophageal reflux disease)    History of stress test    a. 06/2018 MV: EF 59%, no ischemia/infarct. Low risk.   Hypertension    Knee pain    Lactose intolerance    Mitral regurgitation    a. 05/2018 Echo: mild to mod MR.   Multinodular goiter    Obesity    OSA (obstructive sleep apnea)    PAF (paroxysmal atrial fibrillation) (HCC)    a.  Diagnosed 12/19; b. 05/2018 s/p DCCV; c. 03/2019 & 05/2019 recurrent AFib-->managed w/ amio load; d. CHADS2VASc = 6 (CHF, HTN, age x 2, vascular disease, female)-->Eliquis  & amio 100 qd.   PAH (pulmonary artery hypertension) (HCC)    RSV (acute bronchiolitis due to respiratory syncytial virus) 05/10/2022   Scoliosis    SOB (shortness of breath)    Swallowing difficulty     PAST SURGICAL HISTORY:   Past Surgical History:  Procedure Laterality Date   AV NODE ABLATION N/A 07/05/2022   Procedure: AV NODE ABLATION;  Surgeon: Cindie Ole DASEN, MD;  Location: [facility];  Service:  Cardiovascular;  Laterality: N/A;   CARDIOVERSION N/A 06/15/2018   Procedure: CARDIOVERSION (CATH LAB);  Surgeon: Perla Evalene PARAS, MD;  Location: [facility] OR[facility];  Service: Cardiovascular;  Laterality: N/A;   CARDIOVERSION N/A 02/18/2021   Procedure: CARDIOVERSION;  Surgeon: Perla Evalene PARAS, MD;  Location: [facility];  Service: Cardiovascular;  Laterality: N/A;   CATARACT EXTRACTION W/PHACO Right 01/25/2016   Procedure: CATARACT EXTRACTION PHACO AND INTRAOCULAR LENS PLACEMENT (IOC);  Surgeon: Donzell Arlyce Budd, MD;  Location: [facility];  Service: Ophthalmology;  Laterality: Right;  RIGHT   CATARACT EXTRACTION W/PHACO Left 02/22/2016   Procedure: CATARACT EXTRACTION PHACO AND  INTRAOCULAR LENS PLACEMENT (IOC);  Surgeon: Donzell Arlyce Budd, MD;  Location: [facility];  Service: Ophthalmology;  Laterality: Left;  LEFT   COLONOSCOPY N/A 01/22/2024   Procedure: COLONOSCOPY;  Surgeon: Therisa Bi, MD;  Location: [facility];  Service: Gastroenterology;  Laterality: N/A;   HAMMER TOE SURGERY  05/16/2008   KNEE ARTHROSCOPY Right 05/16/2002   PACEMAKER IMPLANT N/A 10/25/2021   Procedure: PACEMAKER IMPLANT;  Surgeon: Cindie Ole DASEN, MD;  Location: [facility];  Service: Cardiovascular;  Laterality: N/A;   PARTIAL HIP ARTHROPLASTY Left 12/17/2021   POLYPECTOMY  01/22/2024   Procedure: POLYPECTOMY, INTESTINE;  Surgeon: Therisa Bi, MD;  Location: [facility] ENDOSCOPY;  Service: Gastroenterology;;   REPLACEMENT TOTAL KNEE Right 05/17/2007   [facility]   SKIN GRAFT Left 04/08/2013   Done on left index finger   TONSILLECTOMY  05/16/1944   TOTAL KNEE ARTHROPLASTY Left 12/02/2020    SOCIAL HISTORY:   Social History   Tobacco Use   Smoking status: Former    Current packs/day: 0.00    Average packs/day: 1 pack/day for 30.0 years (30.0 ttl pk-yrs)    Types: Cigarettes    Start date: 05/17/1959    Quit date: 05/16/1989    Years since quitting: 34.7    Passive exposure: Past   Smokeless tobacco: Never  Substance Use Topics   Alcohol use: Yes    Alcohol/week: 14.0 standard drinks of alcohol    Types: 14 Glasses of wine per week    FAMILY HISTORY:   Family History  Problem Relation Age of Onset   Hyperlipidemia Sister    Atrial fibrillation Sister    Transient ischemic attack Mother    Heart disease Mother    Heart attack Father    High blood pressure Father    Stroke Father    Healthy Brother    Breast cancer Sister 66   Hyperlipidemia Sister    Atrial fibrillation Sister     DRUG ALLERGIES:   Allergies  Allergen Reactions   Levofloxacin Other (See Comments)    Other reaction(s): Joint Pains   Influenza Vaccine  Recombinant Other (See Comments)   Influenza Vaccines Other (See Comments)    Bell's Palsy   Oysters [Shellfish Allergy] Swelling    She states she had eaten them three days in a row and she developed swelling around her eyes.     REVIEW OF SYSTEMS:   ROS As per history of present illness. All pertinent systems were reviewed above. Constitutional, HEENT, cardiovascular, respiratory, GI, GU, musculoskeletal, neuro, psychiatric, endocrine, integumentary and hematologic systems were reviewed and are otherwise negative/unremarkable except for positive findings mentioned above in the HPI.   MEDICATIONS AT HOME:   Prior to Admission medications   Medication Sig Start Date End Date Taking? Authorizing Provider  ELIQUIS  5 MG TABS tablet TAKE ONE TABLET TWICE DAILY 10/02/23  Yes Gollan, Timothy J, MD  allopurinol  (ZYLOPRIM ) 100 MG tablet TAKE ONE TABLET BY MOUTH EVERY DAY 12/04/23   Bacigalupo, Angela M, MD  calcium  carbonate (TUMS EX) 750 MG chewable tablet Chew 2 tablets by mouth daily as needed for heartburn. 12/07/20   [provider]  Cholecalciferol  25 MCG (1000 UT) tablet Take 1,000 Units by mouth daily.    [provider]  dapagliflozin  propanediol (FARXIGA ) 10 MG TABS tablet Take 1 tablet (10 mg total) by mouth daily before breakfast. 06/05/23   Zenaida Morene PARAS, MD  ezetimibe  (ZETIA ) 10 MG tablet TAKE 1 TABLET BY MOUTH DAILY 12/04/23   Bacigalupo, Angela M, MD  furosemide  (LASIX ) 40 MG tablet TAKE 1 TABLET BY MOUTH DAILY. TAKE AN EXTRA TABLET AS NEEDED AFTER LUNCH FOR ABDOMINAL SWELLING, LEG SWELLING OR SHORTNESS OF BREATH 05/29/23   Gollan, Timothy J, MD  gabapentin  (NEURONTIN ) 600 MG tablet Take 1 tablet (600 mg total) by mouth at bedtime. 01/11/24   Bacigalupo, Angela M, MD  lactobacillus acidophilus (BACID) TABS tablet Take 2 tablets by mouth 3 (three) times daily. Patient not taking: Reported on 01/17/2024    [provider]  Magnesium  300 MG CAPS Take by mouth.     [provider]  Menthol, Topical Analgesic, (BIOFREEZE EX) Apply 1 application. topically daily as needed (Neck pain).    [provider]  metaxalone  (SKELAXIN ) 800 MG tablet Take 1 tablet (800 mg total) by mouth daily as needed for muscle spasms. 03/20/23   Bacigalupo, Angela M, MD  Multiple Vitamin (MULTIVITAMIN) capsule Take 1 capsule by mouth daily.    [provider]  mupirocin  ointment (BACTROBAN ) 2 % Place 1 application  into the nose 2 (two) times daily. 10/26/21   Lesia Ozell Barter, PA-C  potassium chloride  (KLOR-CON ) 10 MEQ tablet TAKE 1 TABLET BY MOUTH DAILY 01/19/24   Gollan, Timothy J, MD  Tafamidis  (VYNDAMAX ) 61 MG CAPS Take 1 capsule (61 mg total) by mouth daily. 07/27/23   Zenaida Morene PARAS, MD  Tiotropium Bromide -Olodaterol (STIOLTO RESPIMAT ) 2.5-2.5 MCG/ACT AERS Inhale 2 puffs into the lungs daily. 10/26/22   Hunsucker, Donnice SAUNDERS, MD      VITAL SIGNS:  Blood pressure (!) 182/80, pulse 70, temperature 98.2 F (36.8 C), temperature source Oral, resp. rate (!) 21, height 5' 7 (1.702 m), weight 97.5 kg, SpO2 99%.  PHYSICAL EXAMINATION:  Physical Exam  GENERAL:  83 y.o.-year-old Caucasian female patient lying in the bed with no acute distress.  EYES: Pupils equal, round, reactive to light and accommodation. No scleral icterus. Extraocular muscles intact.  HEENT: Head atraumatic, normocephalic. Oropharynx and nasopharynx clear.  NECK:  Supple, no jugular venous distention. No thyroid  enlargement, no tenderness.  LUNGS: Normal breath sounds bilaterally, no wheezing, rales,rhonchi or crepitation. No use of accessory muscles of respiration.  CARDIOVASCULAR: Regular rate and rhythm, S1, S2 normal. No murmurs, rubs, or gallops.  ABDOMEN: Soft, nondistended, nontender. Bowel sounds present. No organomegaly or mass.  EXTREMITIES: No pedal edema, cyanosis, or clubbing.  NEUROLOGIC: Cranial nerves II through XII are intact. Muscle strength 5/5 in all  extremities. Sensation intact. Gait not checked.  PSYCHIATRIC: The patient is alert and oriented x 3.  Normal affect and good eye contact. SKIN: No obvious rash, lesion, or ulcer.   LABORATORY PANEL:   CBC Recent Labs  Lab 01/24/24 0008  WBC 9.2  HGB 13.7  HCT 41.2  PLT 239   ------------------------------------------------------------------------------------------------------------------  Chemistries  Recent Labs  Lab 01/24/24 0008  NA  141  K 3.6  CL 102  CO2 27  GLUCOSE 109*  BUN 22  CREATININE 0.90  CALCIUM  9.5   ------------------------------------------------------------------------------------------------------------------  Cardiac Enzymes No results for input(s): TROPONINI in the last 168 hours. ------------------------------------------------------------------------------------------------------------------  RADIOLOGY:  DG Chest Port 1 View Result Date: 01/23/2024 CLINICAL DATA:  Chest pain. EXAM: PORTABLE CHEST 1 VIEW COMPARISON:  Chest radiograph dated 05/26/2023. FINDINGS: No focal consolidation, pleural effusion or pneumothorax. Diffuse interstitial reticular coarsening, likely chronic. Atypical infection is not excluded. Stable cardiomegaly. No acute osseous pathology. Left pectoral pacemaker device. IMPRESSION: 1. No focal consolidation. 2. Cardiomegaly. Electronically Signed   By: Vanetta Chou M.D.   On: 01/23/2024 20:24      IMPRESSION AND PLAN:  Assessment and Plan: * NSTEMI (non-ST elevated myocardial infarction) [facility]) - The patient was admitted to a progressive unit bed. - Will continue IV heparin . - Will be placed on aspirin  as well as as needed sublingual glycerin and IV morphine  sulfate for pain. - She will be placed on beta-blocker therapy. - Will hold off long-acting beta agonist. - High-dose statin therapy will be provided and fasting lipids will be checked. - 2D echo and cardiology consult will be obtained. - I will notify [facility]  about the patient.  Dr. Barbaraann is aware.  Dyslipidemia - Will continue Zetia  and the patient will be placed on high-dose statin.  Chronic diastolic CHF (congestive heart failure) (HCC) - Will continue Demadex and Farxiga .  Paroxysmal atrial fibrillation (HCC) - The patient will be on IV heparin  to replace Eliquis  which has been held off for 5 days given her colonoscopy yesterday. - Will optimize electrolytes.  Gout - Will continue allopurinol .  Peripheral neuropathy - Will continue Neurontin .   DVT prophylaxis: IV heparin . Advanced Care Planning:  Code Status: The patient is DNR only. Family Communication:  The plan of care was discussed in details with the patient (and family). I answered all questions. The patient agreed to proceed with the above mentioned plan. Further management will depend upon hospital course. Disposition Plan: Back to previous home environment Consults called: Cardiology. All the records are reviewed and case discussed with ED provider.  Status is: Inpatient  At the time of the admission, it appears that the appropriate admission status for this patient is inpatient.  This is judged to be reasonable and necessary in order to provide the required intensity of service to ensure the patient's safety given the presenting symptoms, physical exam findings and initial radiographic and laboratory data in the context of comorbid conditions.  The patient requires inpatient status due to high intensity of service, high risk of further deterioration and high frequency of surveillance required.  I certify that at the time of admission, it is my clinical judgment that the patient will require inpatient hospital care extending more than 2 midnights.                            Dispo: The patient is from: Home              Anticipated d/c is to: Home              Patient currently is not medically stable to d/c.              Difficult to place patient: No  Madison DELENA Peaches M.D  on 01/24/2024 at 2:18 AM  [facility]   From 7 PM-7 AM, contact night-coverage [email or web address]  CC:  Primary care physician; Myrla Jon HERO, MD

## 2024-01-23 NOTE — Consult Note (Signed)
 Pharmacy Consult Note - Anticoagulation  Pharmacy Consult for heparin  Indication: chest pain/ACS  PATIENT MEASUREMENTS: Height: 5' 7 (170.2 cm) Weight: 97.5 kg (215 lb) IBW/kg (Calculated) : 61.6 HEPARIN  DW (KG): 83.2  VITAL SIGNS: Temp: 98.3 F (36.8 C) (09/09 2004) BP: 182/80 (09/09 2200) Pulse Rate: 70 (09/09 2200)  Recent Labs    01/23/24 2005 01/23/24 2153  HGB 15.1*  --   HCT 45.7  --   PLT 247  --   APTT  --  30  LABPROT  --  13.7  INR  --  1.0  CREATININE 1.00  --   TROPONINIHS 799* 5,728*    Estimated Creatinine Clearance: 50.2 mL/min (by C-G formula based on SCr of 1 mg/dL).  PAST MEDICAL HISTORY: Past Medical History:  Diagnosis Date   (HFpEF) heart failure with preserved ejection fraction (HCC)    a. 05/2018 Echo: EF 55-60%, no rwma, mild to mod MR. Nl RV fxn. Mod TR. PASP .   Arthritis    knees, Hands   Arthritis of knee    Back pain    Carotid arterial disease (HCC)    a. 03/2019 Carotid U/S: <50% bilat ICA stenoses.   CHF (congestive heart failure) (HCC)    Cholelithiasis    a. 10/2018 noted on CT.   Edema, lower extremity    Fatty liver    GERD (gastroesophageal reflux disease)    History of stress test    a. 06/2018 MV: EF 59%, no ischemia/infarct. Low risk.   Hypertension    Knee pain    Lactose intolerance    Mitral regurgitation    a. 05/2018 Echo: mild to mod MR.   Multinodular goiter    Obesity    OSA (obstructive sleep apnea)    PAF (paroxysmal atrial fibrillation) (HCC)    a.  Diagnosed 12/19; b. 05/2018 s/p DCCV; c. 03/2019 & 05/2019 recurrent AFib-->managed w/ amio load; d. CHADS2VASc = 6 (CHF, HTN, age x 2, vascular disease, female)-->Eliquis  & amio 100 qd.   PAH (pulmonary artery hypertension) (HCC)    RSV (acute bronchiolitis due to respiratory syncytial virus) 05/10/2022   Scoliosis    SOB (shortness of breath)    Swallowing difficulty     ASSESSMENT: 83 y.o. 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 cTn trending up, most recently at 5,728. Patient's last dose of Eliquis  was on 01/19/2024 because it had been held for a colonoscopy. CHA2DS2VASc is 6 (CHF, HTN, age+2, CAD, female sex.)   Pharmacy has been consulted to initiate and manage heparin  intravenous infusion.  Pertinent medications: Eliquis  5mg  PO twice daily -- last dose was on 01/19/2024  Goal(s) of therapy: Heparin  level 0.3 - 0.7 units/mL aPTT 66 - 102 seconds Monitor platelets by anticoagulation protocol: Yes   Baseline anticoagulation labs: Recent Labs    01/23/24 2005 01/23/24 2153  APTT  --  30  INR  --  1.0  HGB 15.1*  --   PLT 247  --     Date Time aPTT/HL Rate/Comment     PLAN: Give 4000 units bolus x1; then start heparin  infusion at 1000 units/hour. Check antiXa level 8 hours after start of infusion Continue to monitor CBC daily while on heparin  infusion.   Will M. Lenon, PharmD, BCPS Clinical Pharmacist 01/23/2024 10:32 PM

## 2024-01-23 NOTE — ED Provider Notes (Signed)
 [facility] Provider Note    Event Date/Time   First MD Initiated Contact with Patient 01/23/24 2115     (approximate)   History   Chest Pain  Pt reports chest pain that began earlier today, pt has pacemaker placed back in 2023 for a fib. Pt reports taking meds for indigestion with no relief. Pt reports some nausea and diaphoresis with the pain as well.    HPI Lauren Lloyd is a 83 y.o. female PMH A-fib on Eliquis , CHF, s/p pacemaker placement, emphysema presents for evaluation of chest pain - Left-sided, started at rest around 5:30 PM.  Was more severe earlier, now improved but persistent.  Described as severe tightness.  Extends from left chest into left axilla. - No history of DVT/PE, no recent surgery/station/travel, no unilateral leg swelling, no pleuritic discomfort, no shortness of breath - Has not taken her Eliquis  for about 5 days because she had a colonoscopy yesterday and was advised not to. -       Physical Exam   Triage Vital Signs: ED Triage Vitals  Encounter Vitals Group     BP 01/23/24 2004 (!) 176/98     Girls Systolic BP Percentile --      Girls Diastolic BP Percentile --      Boys Systolic BP Percentile --      Boys Diastolic BP Percentile --      Pulse Rate 01/23/24 2004 (!) 118     Resp 01/23/24 2004 19     Temp 01/23/24 2004 98.3 F (36.8 C)     Temp src --      SpO2 01/23/24 2004 98 %     Weight 01/23/24 2003 215 lb (97.5 kg)     Height 01/23/24 2003 5' 7 (1.702 m)     Head Circumference --      Peak Flow --      Pain Score 01/23/24 2002 5     Pain Loc --      Pain Education --      Exclude from Growth Chart --     Most recent vital signs: Vitals:   01/23/24 2004 01/23/24 2200  BP: (!) 176/98 (!) 182/80  Pulse: (!) 118 70  Resp: 19 (!) 21  Temp: 98.3 F (36.8 C)   SpO2: 98% 99%     General: Awake, no distress.  CV:  Good peripheral perfusion. RRR, RP 2+ --equal pulses in all extremities Resp:  Normal  effort. CTAB Abd:  No distention. Nontender to deep palpation throughout, no pulsatile abdominal masses    ED Results / Procedures / Treatments   Labs (all labs ordered are listed, but only abnormal results are displayed) Labs Reviewed  BASIC METABOLIC PANEL WITH GFR - Abnormal; Notable for the following components:      Result Value   Potassium 3.2 (*)    Glucose, Bld 139 (*)    BUN 25 (*)    GFR, Estimated 56 (*)    All other components within normal limits  CBC - Abnormal; Notable for the following components:   Hemoglobin 15.1 (*)    All other components within normal limits  TROPONIN I (HIGH SENSITIVITY) - Abnormal; Notable for the following components:   Troponin I (High Sensitivity) 799 (*)    All other components within normal limits  TROPONIN I (HIGH SENSITIVITY) - Abnormal; Notable for the following components:   Troponin I (High Sensitivity) 5,728 (*)    All other components within normal  limits  PROTIME-INR  APTT  HEPARIN  LEVEL (UNFRACTIONATED)  CBC  LIPOPROTEIN A (LPA)  BASIC METABOLIC PANEL WITH GFR  LIPID PANEL  HEPARIN  LEVEL (UNFRACTIONATED)     EKG  Ventricular paced, rate 75, appears similar to prior.  No clear ischemia on my interpretation.   RADIOLOGY Chest x-ray interpreted by myself and radiology report reviewed, no acute pathology identified  PROCEDURES:  Critical Care performed: Yes, see critical care procedure note(s)  .Critical Care  Performed by: Clarine Ozell LABOR, MD Authorized by: Clarine Ozell LABOR, MD   Critical care provider statement:    Critical care time (minutes):  30   Critical care time was exclusive of:  Separately billable procedures and treating other patients   Critical care was necessary to treat or prevent imminent or life-threatening deterioration of the following conditions:  Cardiac failure   Critical care was time spent personally by me on the following activities:  Development of treatment plan with patient or  surrogate, discussions with consultants, evaluation of patient's response to treatment, examination of patient, ordering and review of laboratory studies, ordering and review of radiographic studies, ordering and performing treatments and interventions, pulse oximetry, re-evaluation of patient's condition and review of old charts   I assumed direction of critical care for this patient from another provider in my specialty: no     Care discussed with: admitting provider      MEDICATIONS ORDERED IN ED: Medications  nitroGLYCERIN  (NITROSTAT ) SL tablet 0.4 mg (has no administration in time range)  heparin  ADULT infusion 100 units/mL (25000 units/250mL) (1,000 Units/hr Intravenous New Bag/Given 01/23/24 2233)  allopurinol  (ZYLOPRIM ) tablet 100 mg (has no administration in time range)  ezetimibe  (ZETIA ) tablet 10 mg (has no administration in time range)  furosemide  (LASIX ) tablet 40 mg (has no administration in time range)  Tafamidis  CAPS 61 mg (has no administration in time range)  dapagliflozin  propanediol (FARXIGA ) tablet 10 mg (has no administration in time range)  calcium  carbonate (TUMS EX) chewable tablet 1,500 mg (has no administration in time range)  lactobacillus acidophilus (BACID) tablet 2 tablet (has no administration in time range)  gabapentin  (NEURONTIN ) capsule 600 mg (has no administration in time range)  metaxalone  (SKELAXIN ) tablet 800 mg (has no administration in time range)  Cholecalciferol  1,000 Units (has no administration in time range)  multivitamin capsule 1 capsule (has no administration in time range)  potassium chloride  (KLOR-CON ) CR tablet 10 mEq (has no administration in time range)  aspirin  chewable tablet 324 mg (has no administration in time range)    Or  aspirin  suppository 300 mg (has no administration in time range)  aspirin  EC tablet 81 mg (has no administration in time range)  acetaminophen  (TYLENOL ) tablet 650 mg (has no administration in time range)   ondansetron  (ZOFRAN ) injection 4 mg (has no administration in time range)  ALPRAZolam  (XANAX ) tablet 0.25 mg (has no administration in time range)  magnesium  hydroxide (MILK OF MAGNESIA) suspension 30 mL (has no administration in time range)  traZODone  (DESYREL ) tablet 25 mg (has no administration in time range)  0.9 %  sodium chloride  infusion (has no administration in time range)  aspirin  chewable tablet 324 mg (324 mg Oral Given 01/23/24 2147)  heparin  bolus via infusion 4,000 Units (4,000 Units Intravenous Bolus from Bag 01/23/24 2232)     IMPRESSION / MDM / ASSESSMENT AND PLAN / ED COURSE  I reviewed the triage vital signs and the nursing notes.  DDX/MDM/AP: Differential diagnosis includes, but is not limited to, ACS, GERD, less likely PE, doubt underlying pneumothorax.  Considered but doubt aortic dissection given the equal pulses all extremities, localization of pain in the lateral left chest, no neurologic deficits appreciated.  Plan: - Labs  -EKG  -chest x-ray - Cardiac monitor  Patient's presentation is most consistent with acute presentation with potential threat to life or bodily function.  The patient is on the cardiac monitor to evaluate for evidence of arrhythmia and/or significant heart rate changes.  ED course below.  Initial troponin 799, aspirin  and heparin  started, cardiology consulted, no negation for emergent procedural intervention from their perspective.  Admitted to hospitalist service.  Clinical Course as of 01/23/24 2356  Tue Jan 23, 2024  2138 Paging cardiology [MM]  2148 Admit  [MM]  2149 D/w Dr. Barbaraann of cardiology Agrees with with aspirin , heparin  Request n.p.o. at midnight Okay to give nitroglycerin  Admit to hospitalist, they will evaluate in the morning [MM]  2150 Hospitalist consult order placed [MM]    Clinical Course User Index [MM] Clarine Ozell LABOR, MD     FINAL CLINICAL IMPRESSION(S) / ED DIAGNOSES   Final  diagnoses:  NSTEMI (non-ST elevated myocardial infarction) [facility])     Rx / DC Orders   ED Discharge Orders     None        Note:  This document was prepared using Dragon voice recognition software and may include unintentional dictation errors.   Clarine Ozell LABOR, MD 01/23/24 [phone]

## 2024-01-24 ENCOUNTER — Other Ambulatory Visit

## 2024-01-24 ENCOUNTER — Ambulatory Visit (INDEPENDENT_AMBULATORY_CARE_PROVIDER_SITE_OTHER): Payer: Medicare HMO

## 2024-01-24 ENCOUNTER — Inpatient Hospital Stay (HOSPITAL_COMMUNITY)
Admit: 2024-01-24 | Discharge: 2024-01-24 | Disposition: A | Discharge status: Home / Self Care | Attending: Family Medicine | Admitting: Family Medicine

## 2024-01-24 ENCOUNTER — Other Ambulatory Visit: Payer: Self-pay

## 2024-01-24 DIAGNOSIS — G629 Polyneuropathy, unspecified: Secondary | ICD-10-CM

## 2024-01-24 DIAGNOSIS — I480 Paroxysmal atrial fibrillation: Secondary | ICD-10-CM

## 2024-01-24 DIAGNOSIS — J449 Chronic obstructive pulmonary disease, unspecified: Secondary | ICD-10-CM

## 2024-01-24 DIAGNOSIS — I4821 Permanent atrial fibrillation: Secondary | ICD-10-CM

## 2024-01-24 DIAGNOSIS — R0602 Shortness of breath: Secondary | ICD-10-CM

## 2024-01-24 DIAGNOSIS — I214 Non-ST elevation (NSTEMI) myocardial infarction: Secondary | ICD-10-CM

## 2024-01-24 DIAGNOSIS — I495 Sick sinus syndrome: Secondary | ICD-10-CM

## 2024-01-24 DIAGNOSIS — E785 Hyperlipidemia, unspecified: Secondary | ICD-10-CM

## 2024-01-24 DIAGNOSIS — I272 Pulmonary hypertension, unspecified: Secondary | ICD-10-CM

## 2024-01-24 DIAGNOSIS — M109 Gout, unspecified: Secondary | ICD-10-CM | POA: Insufficient documentation

## 2024-01-24 LAB — HEPARIN LEVEL (UNFRACTIONATED)
Heparin Unfractionated: 0.29 [IU]/mL — ABNORMAL LOW (ref 0.30–0.70)
Heparin Unfractionated: 0.43 [IU]/mL (ref 0.30–0.70)
Heparin Unfractionated: 0.45 [IU]/mL (ref 0.30–0.70)

## 2024-01-24 LAB — ECHOCARDIOGRAM COMPLETE
AR max vel: 2.2 cm2
AV Area VTI: 2.5 cm2
AV Area mean vel: 2.23 cm2
AV Mean grad: 3 mmHg
AV Peak grad: 6 mmHg
Ao pk vel: 1.22 m/s
Area-P 1/2: 4.83 cm2
Height: 67 in
S' Lateral: 2.6 cm
Weight: 3440 [oz_av]

## 2024-01-24 LAB — CBC
HCT: 41.2 % (ref 36.0–46.0)
Hemoglobin: 13.7 g/dL (ref 12.0–15.0)
MCH: 30.9 pg (ref 26.0–34.0)
MCHC: 33.3 g/dL (ref 30.0–36.0)
MCV: 92.8 fL (ref 80.0–100.0)
Platelets: 239 K/uL (ref 150–400)
RBC: 4.44 MIL/uL (ref 3.87–5.11)
RDW: 13.7 % (ref 11.5–15.5)
WBC: 9.2 K/uL (ref 4.0–10.5)
nRBC: 0 % (ref 0.0–0.2)

## 2024-01-24 LAB — LIPID PANEL
Cholesterol: 189 mg/dL (ref 0–200)
HDL: 60 mg/dL (ref >40)
LDL Cholesterol: 119 mg/dL — ABNORMAL HIGH (ref 0–99)
Total CHOL/HDL Ratio: 3.2 ratio
Triglycerides: 49 mg/dL (ref <150)
VLDL: 10 mg/dL (ref 0–40)

## 2024-01-24 LAB — BASIC METABOLIC PANEL WITH GFR
Anion gap: 12 (ref 5–15)
BUN: 22 mg/dL (ref 8–23)
CO2: 27 mmol/L (ref 22–32)
Calcium: 9.5 mg/dL (ref 8.9–10.3)
Chloride: 102 mmol/L (ref 98–111)
Creatinine, Ser: 0.9 mg/dL (ref 0.44–1.00)
GFR, Estimated: >60 mL/min (ref >60)
Glucose, Bld: 109 mg/dL — ABNORMAL HIGH (ref 70–99)
Potassium: 3.6 mmol/L (ref 3.5–5.1)
Sodium: 141 mmol/L (ref 135–145)

## 2024-01-24 MED ORDER — POTASSIUM CHLORIDE CRYS ER 20 MEQ PO TBCR
20.0000 meq | EXTENDED_RELEASE_TABLET | Freq: Once | ORAL | Status: AC
Start: 2024-01-24 — End: 2024-01-24
  Administered 2024-01-24: 20 meq via ORAL
  Filled 2024-01-24: qty 1

## 2024-01-24 MED ORDER — METOPROLOL TARTRATE 25 MG PO TABS
12.5000 mg | ORAL_TABLET | Freq: Two times a day (BID) | ORAL | Status: DC
  Administered 2024-01-24 – 2024-01-26 (×5): 12.5 mg via ORAL
  Filled 2024-01-24 (×5): qty 1

## 2024-01-24 MED ORDER — FREE WATER
250.0000 mL | Freq: Once | Status: AC
  Administered 2024-01-25: 250 mL via ORAL

## 2024-01-24 MED ORDER — HEPARIN BOLUS VIA INFUSION
1250.0000 [IU] | Freq: Once | INTRAVENOUS | Status: AC
  Administered 2024-01-24: 1250 [IU] via INTRAVENOUS
  Filled 2024-01-24: qty 1250

## 2024-01-24 MED ORDER — ASPIRIN 81 MG PO CHEW
81.0000 mg | CHEWABLE_TABLET | ORAL | Status: AC
  Administered 2024-01-25: 81 mg via ORAL
  Filled 2024-01-24: qty 1

## 2024-01-24 MED ORDER — ATORVASTATIN CALCIUM 80 MG PO TABS
80.0000 mg | ORAL_TABLET | Freq: Every day | ORAL | Status: DC
  Administered 2024-01-24 – 2024-01-26 (×3): 80 mg via ORAL
  Filled 2024-01-24 (×2): qty 1
  Filled 2024-01-24: qty 4

## 2024-01-24 NOTE — Progress Notes (Signed)
 Progress Note   Patient: Lauren Lloyd [id number] DOB: 10-Nov-1940 DOA: 01/23/2024     1 DOS: the patient was seen and examined on 01/24/2024   Brief hospital course: 83yo with h/o carotid artery disease, chronic HFpEF, HTN, OSA, and PAF who presented on 9/9 with chest pain.  She was found to have an NSTEMI with troponin 799 -> 5728.  She was started on heparin  with cardiology consultation and likely need for cardiac cath.  Assessment and Plan:  NSTEMI (non-ST elevated myocardial infarction)  Acute onset of left-sided CP while at rest, atypical presentation Markedly elevated troponin c/w NSTEMI Admitted to a progressive unit bed Continue IV heparin  Will be placed on aspirin  as well as as needed sublingual glycerin and IV morphine  sulfate for pain She will be placed on beta-blocker therapy Will hold off long-acting beta agonist High-dose statin therapy will be provided and fasting lipids will be checked 2D echo and cardiology consult will be obtained Planned for cardiac cath on 9/11   Dyslipidemia Will continue Zetia  and the patient will be placed on high-dose statin   Chronic diastolic CHF (congestive heart failure)  Will continue Demadex and Farxiga  Echo ordered   Paroxysmal atrial fibrillation  The patient will be on IV heparin  to replace Eliquis  which has been held for 5 days given her colonoscopy on 9/7 No longer on bisoprolol    Gout Continue allopurinol    Peripheral neuropathy Continue Neurontin   Class 1 obesity Body mass index is 33.67 kg/m.SABRA  Weight loss should be encouraged Outpatient PCP/bariatric medicine f/u encouraged Significantly low or high BMI is associated with higher medical risk including morbidity and mortality   DNR DNR confirmed at the time of admission Patient will need a gold out of facility DNR form at the time of discharge     Consultants: Cardiology  Procedures: Echocardiogram 9/10  Antibiotics: None  30 Day Unplanned  Readmission Risk Score    Flowsheet Row ED to Hosp-Admission (Current) from 01/23/2024 in [facility] PCU  30 Day Unplanned Readmission Risk Score (%) 10.59 Filed at 01/24/2024 0801    This score is the patient's risk of an unplanned readmission within 30 days of being discharged (0 -100%). The score is based on dignosis, age, lab data, medications, orders, and past utilization.   Low:  0-14.9   Medium: 15-21.9   High: 22-29.9   Extreme: 30 and above           Subjective: Chest pain resolved, feeling ok, understands plan for cath tomorrow.   Objective: Vitals:   01/24/24 1144 01/24/24 1548  BP: (!) 117/91 (!) 140/76  Pulse: 71 71  Resp: 18 18  Temp: 97.9 F (36.6 C) 97.6 F (36.4 C)  SpO2: 96% 96%    Intake/Output Summary (Last 24 hours) at 01/24/2024 1823 Last data filed at 01/24/2024 1751 Gross per 24 hour  Intake 1290.74 ml  Output --  Net 1290.74 ml   Filed Weights   01/23/24 2003  Weight: 97.5 kg    Exam:  General:  Appears calm and comfortable and is in NAD Eyes:  normal lids, iris ENT:  grossly normal hearing, lips & tongue, mmm Cardiovascular:  RRR. No LE edema.  Respiratory:   CTA bilaterally with no wheezes/rales/rhonchi.  Normal respiratory effort. Abdomen:  soft, NT, ND Skin:  no rash or induration seen on limited exam Musculoskeletal:  grossly normal tone BUE/BLE, good ROM, no bony abnormality Psychiatric:  grossly normal mood and affect, speech fluent and appropriate, AOx3  Neurologic:  CN 2-12 grossly intact, moves all extremities in coordinated fashion  Data Reviewed: I have reviewed the patient's lab results since admission.  Pertinent labs for today include:   Glucose 109 Lipids: 189/60/119/49 Normal CBC INR 1.0    Family Communication: Daughter was present      Code Status: Do not attempt resuscitation (DNR) PRE-ARREST INTERVENTIONS DESIRED   Disposition: Status is: Inpatient Remains inpatient appropriate  because: needs cath     Time spent: 50 minutes  Unresulted Labs (From admission, onward)     Start     Ordered   01/25/24 0500  CBC  Tomorrow morning,   R       Question:  Specimen collection method  Answer:  Lab=Lab collect   01/24/24 0734   01/25/24 0500  Basic metabolic panel with GFR  Tomorrow morning,   R       Question:  Specimen collection method  Answer:  Lab=Lab collect   01/24/24 1823   01/25/24 0000  Heparin  level (unfractionated)  Once-Timed,   TIMED       Question:  Specimen collection method  Answer:  Lab=Lab collect   01/24/24 1703   01/24/24 0500  Lipoprotein A (LPA)  Tomorrow morning,   R        01/23/24 2250             Author: Delon Herald, MD 01/24/2024 6:23 PM  For on call review [email or web address].

## 2024-01-24 NOTE — Consult Note (Addendum)
 Pharmacy Consult Note - Anticoagulation  Pharmacy Consult for heparin  Indication: chest pain/ACS  PATIENT MEASUREMENTS: Height: 5' 7 (170.2 cm) Weight: 97.5 kg (215 lb) IBW/kg (Calculated) : 61.6 HEPARIN  DW (KG): 83.2  VITAL SIGNS: Temp: 97.9 F (36.6 C) (09/10 0558) Temp Source: Oral (09/10 0533) BP: 146/71 (09/10 0558) Pulse Rate: 69 (09/10 0558)  Recent Labs    01/23/24 2005 01/23/24 2153 01/24/24 0008 01/24/24 0613  HGB  --   --  13.7  --   HCT  --   --  41.2  --   PLT  --   --  239  --   APTT  --  30  --   --   LABPROT  --  13.7  --   --   INR  --  1.0  --   --   HEPARINUNFRC   < >  --  0.43 0.29*  CREATININE  --   --  0.90  --   TROPONINIHS  --  5,728*  --   --    < > = values in this interval not displayed.    Estimated Creatinine Clearance: 55.8 mL/min (by C-G formula based on SCr of 0.9 mg/dL).  PAST MEDICAL HISTORY: Past Medical History:  Diagnosis Date   (HFpEF) heart failure with preserved ejection fraction (HCC)    a. 05/2018 Echo: EF 55-60%, no rwma, mild to mod MR. Nl RV fxn. Mod TR. PASP .   Arthritis    knees, Hands   Arthritis of knee    Back pain    Carotid arterial disease (HCC)    a. 03/2019 Carotid U/S: <50% bilat ICA stenoses.   CHF (congestive heart failure) (HCC)    Cholelithiasis    a. 10/2018 noted on CT.   Edema, lower extremity    Fatty liver    GERD (gastroesophageal reflux disease)    History of stress test    a. 06/2018 MV: EF 59%, no ischemia/infarct. Low risk.   Hypertension    Knee pain    Lactose intolerance    Mitral regurgitation    a. 05/2018 Echo: mild to mod MR.   Multinodular goiter    Obesity    OSA (obstructive sleep apnea)    PAF (paroxysmal atrial fibrillation) (HCC)    a.  Diagnosed 12/19; b. 05/2018 s/p DCCV; c. 03/2019 & 05/2019 recurrent AFib-->managed w/ amio load; d. CHADS2VASc = 6 (CHF, HTN, age x 2, vascular disease, female)-->Eliquis  & amio 100 qd.   PAH (pulmonary artery hypertension) (HCC)     RSV (acute bronchiolitis due to respiratory syncytial virus) 05/10/2022   Scoliosis    SOB (shortness of breath)    Swallowing difficulty     ASSESSMENT: 83 y.o. 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 cTn trending up, most recently at 5,728. Patient's last dose of Eliquis  was on 01/19/2024 because it had been held for a colonoscopy. CHA2DS2VASc is 6 (CHF, HTN, age+2, CAD, female sex.)   Pharmacy has been consulted to initiate and manage heparin  intravenous infusion.  Pertinent medications: Eliquis  5mg  PO twice daily -- last dose was on 01/19/2024  Goal(s) of therapy: Heparin  level 0.3 - 0.7 units/mL aPTT 66 - 102 seconds Monitor platelets by anticoagulation protocol: Yes   Baseline anticoagulation labs: Recent Labs    01/23/24 2005 01/23/24 2153  01/24/24 0008  APTT  --  30  --   INR  --  1.0  --   HGB 15.1*  --  13.7  PLT 247  --  239    Date Time aPTT/HL Rate/Comment 9/10     0613      0.29               1000 units/hr      PLAN:  NOTE:  LDA documenation says pt had epidural catheter removed on 9/9 @ 2037.   RN double checked and stated that pt did NOT have epidural PTA and has not had an epidural catheter removed since her arrival.  The documented catheter removal is incorrect but is causing false alarm with anticoag / epidural BPA.   9/10:  HL @ 0613 = 0.29, SUBtherapeutic - will order heparin  1250 units IV X 1 bolus and increase drip rate to 1150 units/hr - will recheck HL 8 hrs after rate change Continue to monitor CBC daily while on heparin  infusion.   Lauren Lloyd D Clinical Pharmacist 01/24/2024 7:08 AM

## 2024-01-24 NOTE — Assessment & Plan Note (Signed)
-   The patient will be on IV heparin  to replace Eliquis  which has been held off for 5 days given her colonoscopy yesterday. - Will optimize electrolytes.

## 2024-01-24 NOTE — Consult Note (Signed)
 Pharmacy Consult Note - Anticoagulation  Pharmacy Consult for heparin  Indication: chest pain/ACS  PATIENT MEASUREMENTS: Height: 5' 7 (170.2 cm) Weight: 97.5 kg (215 lb) IBW/kg (Calculated) : 61.6 HEPARIN  DW (KG): 83.2  VITAL SIGNS: Temp: 97.6 F (36.4 C) (09/10 1548) Temp Source: Oral (09/10 1144) BP: 140/76 (09/10 1548) Pulse Rate: 71 (09/10 1548)  Recent Labs    01/23/24 2153 01/24/24 0008 01/24/24 0613 01/24/24 1605  HGB  --  13.7  --   --   HCT  --  41.2  --   --   PLT  --  239  --   --   APTT 30  --   --   --   LABPROT 13.7  --   --   --   INR 1.0  --   --   --   HEPARINUNFRC  --  0.43   < > 0.45  CREATININE  --  0.90  --   --   TROPONINIHS 5,728*  --   --   --    < > = values in this interval not displayed.    Estimated Creatinine Clearance: 55.8 mL/min (by C-G formula based on SCr of 0.9 mg/dL).  PAST MEDICAL HISTORY: Past Medical History:  Diagnosis Date   (HFpEF) heart failure with preserved ejection fraction (HCC)    a. 05/2018 Echo: EF 55-60%, no rwma, mild to mod MR. Nl RV fxn. Mod TR. PASP .   Arthritis    knees, Hands   Arthritis of knee    Back pain    Carotid arterial disease (HCC)    a. 03/2019 Carotid U/S: <50% bilat ICA stenoses.   CHF (congestive heart failure) (HCC)    Cholelithiasis    a. 10/2018 noted on CT.   Edema, lower extremity    Fatty liver    GERD (gastroesophageal reflux disease)    History of stress test    a. 06/2018 MV: EF 59%, no ischemia/infarct. Low risk.   Hypertension    Knee pain    Lactose intolerance    Mitral regurgitation    a. 05/2018 Echo: mild to mod MR.   Multinodular goiter    Obesity    OSA (obstructive sleep apnea)    PAF (paroxysmal atrial fibrillation) (HCC)    a.  Diagnosed 12/19; b. 05/2018 s/p DCCV; c. 03/2019 & 05/2019 recurrent AFib-->managed w/ amio load; d. CHADS2VASc = 6 (CHF, HTN, age x 2, vascular disease, female)-->Eliquis  & amio 100 qd.   PAH (pulmonary artery hypertension) (HCC)     RSV (acute bronchiolitis due to respiratory syncytial virus) 05/10/2022   Scoliosis    SOB (shortness of breath)    Swallowing difficulty     ASSESSMENT: 83 y.o. 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 cTn trending up, most recently at 5,728. Patient's last dose of Eliquis  was on 01/19/2024 because it had been held for a colonoscopy. CHA2DS2VASc is 6 (CHF, HTN, age+2, CAD, female sex.)   Pharmacy has been consulted to initiate and manage heparin  intravenous infusion.  Pertinent medications: Eliquis  5mg  PO twice daily -- last dose was on 01/19/2024  Goal(s) of therapy: Heparin  level 0.3 - 0.7 units/mL aPTT 66 - 102 seconds Monitor platelets by anticoagulation protocol: Yes   Baseline anticoagulation labs: Recent Labs    01/23/24 2005 01/23/24 2153  01/24/24 0008  APTT  --  30  --   INR  --  1.0  --   HGB 15.1*  --  13.7  PLT 247  --  239    Date Time aPTT/HL Rate/Comment 9/10 0613 0.29  SUBtherapeutic; 1000 units/hr 9/10 1605 0.45  Therapeutic x 1; 1150 units/hr    PLAN:  NOTE:  LDA documenation says pt had epidural catheter removed on 9/9 @ 2037.   RN double checked and stated that pt did NOT have epidural PTA and has not had an epidural catheter removed since her arrival.  The documented catheter removal is incorrect but is causing false alarm with anticoag / epidural BPA.  9/10 1605 0.45 Therapeutic x 1; 1150 units/hr Continue heparin  at 1150 units/hr Recheck HL in 8 hours Continue to monitor CBC daily while on heparin  infusion.   Will M. Lenon, PharmD, BCPS Clinical Pharmacist 01/24/2024 4:36 PM

## 2024-01-24 NOTE — Hospital Course (Signed)
 83yo with h/o carotid artery disease, chronic HFpEF, HTN, OSA, and PAF who presented on 9/9 with chest pain.  She was found to have an NSTEMI with troponin 799 -> 5728.  She was started on heparin  with cardiology consultation and likely need for cardiac cath.

## 2024-01-24 NOTE — Assessment & Plan Note (Addendum)
-   The patient was admitted to a progressive unit bed. - Will continue IV heparin . - Will be placed on aspirin  as well as as needed sublingual glycerin and IV morphine  sulfate for pain. - She will be placed on beta-blocker therapy. - Will hold off long-acting beta agonist. - High-dose statin therapy will be provided and fasting lipids will be checked. - 2D echo and cardiology consult will be obtained. - I will notify [facility] group about the patient.  Dr. Barbaraann is aware.

## 2024-01-24 NOTE — Progress Notes (Signed)
*  PRELIMINARY RESULTS* Echocardiogram 2D Echocardiogram has been performed.  Floydene Harder 01/24/2024, 9:56 AM

## 2024-01-24 NOTE — Plan of Care (Signed)

## 2024-01-24 NOTE — Assessment & Plan Note (Signed)
-   Will continue Zetia  and the patient will be placed on high-dose statin.

## 2024-01-24 NOTE — Assessment & Plan Note (Signed)
Will continue allopurinol. 

## 2024-01-24 NOTE — Plan of Care (Signed)
  Problem: Activity: Goal: Ability to tolerate increased activity will improve Outcome: Progressing   Problem: Education: Goal: Knowledge of General Education information will improve Description Including pain rating scale, medication(s)/side effects and non-pharmacologic comfort measures Outcome: Progressing   Problem: Clinical Measurements: Goal: Respiratory complications will improve Outcome: Progressing

## 2024-01-24 NOTE — Assessment & Plan Note (Addendum)
-   Will continue Demadex and Farxiga .

## 2024-01-24 NOTE — Plan of Care (Signed)
  Problem: Education: Goal: Knowledge of General Education information will improve Description: Including pain rating scale, medication(s)/side effects and non-pharmacologic comfort measures Outcome: Progressing   Problem: Clinical Measurements: Goal: Respiratory complications will improve Outcome: Progressing   Problem: Clinical Measurements: Goal: Cardiovascular complication will be avoided Outcome: Progressing   Problem: Activity: Goal: Risk for activity intolerance will decrease Outcome: Progressing   Problem: Pain Managment: Goal: General experience of comfort will improve and/or be controlled Outcome: Progressing   Problem: Safety: Goal: Ability to remain free from injury will improve Outcome: Progressing

## 2024-01-24 NOTE — Assessment & Plan Note (Signed)
-   Will continue Neurontin .

## 2024-01-24 NOTE — Consult Note (Signed)
 Cardiology Consultation   Patient ID: Lauren Lloyd MRN: [id number]; DOB: 13-Mar-1941  Admit date: 01/23/2024 Date of Consult: 01/24/2024  PCP:  Myrla Jon HERO, MD   [facility] Providers Cardiologist:  Evalene Lunger, MD  Electrophysiologist:  OLE ONEIDA HOLTS, MD       Patient Profile: Lauren Lloyd is a 83 y.o. female with a hx of HFpEF, permanent atrial fibrillation s/p AV nodal ablation, hypertension, OSA, aortic atherosclerosis, pulmonary hypertension, and COPD who is being seen 01/24/2024 for the evaluation of chest pain at the request of Dr. Barbarann.  History of Present Illness:   Lauren Lloyd was diagnosed with atrial fibrillation in 04/2018 when she was placed on oral anticoagulation and underwent cardioversion 05/2018.  She had recurrent A-fib 03/2019 and was placed on amiodarone  therapy.  She converted to sinus rhythm but then stopped amiodarone  by early December due to concerns about dyspnea.  Amiodarone  was subsequently resumed and she was noted to be back in atrial fibrillation 05/2019.  Following additional amiodarone  loading, she had converted back to sinus rhythm 08/2019.  ZIO monitoring 02/2020 did not show any evidence of atrial fibrillation.  She did have 20 brief episodes of SVT, though none of these were triggered events.  She had knee surgery in 11/2020 and noted tachypalpitations consistent with recurrence of atrial fibrillation.  Amiodarone  and bisoprolol  were adjusted.  She was seen in follow-up 01/2021 and noted to be in atrial fibrillation.  She underwent DCCV 02/2021.  However, she was seen again in follow-up 02/2021 and noted to be back in atrial fibrillation.  She was referred to EP at that time and saw Dr. HOLTS 03/2021 during which amiodarone  was discontinued due to ineffectiveness.  She was admitted 10/2021 for initiation of Tikosyn  which failed due to nonsustained polymorphic VT.  During hospitalization she converted from atrial fibrillation to sinus  rhythm with a 6-second posttermination pause and sinus bradycardia with rates in the 50s bpm.  [employer] dual-chamber PPM implanted 10/25/2021 for tachybradycardia syndrome.  She underwent AV nodal ablation 06/2022.  Patient was evaluated by advanced heart failure 05/2023 and AL workup was remarkable for MGUS but normal K/L ratio.  PYP scan positive for G2 uptake in 8/CL 1.4.  She was started on tafamidis  and genetic testing was obtained.  She noted to have chest pain which was felt to be due to amyloid.    Patient presented to [facility] ED the evening of 9/8 complaining of chest pain which had started that evening while she was watching TV.  She initially felt that the pain was related to indigestion but had taken medications without improvement.  The pain persisted and she had associated nausea and diaphoresis which concerned her, prompting her to drive herself to the emergency room.  In the ED, BP 176/98, HR 118 bpm with otherwise normal vital signs.  Pertinent labs include potassium 3.2, BUN 25, Hgb 15.1.  Initial troponin 799 > 5728.  EKG shows permanent atrial fibrillation without acute ST/T wave abnormalities.  Chest x-ray negative.  Patient was given aspirin  and started on IV heparin .  Cardiology was asked to consult for further evaluation of NSTEMI.  At time of cardiology consult, patient reports that chest pain is largely resolved.  She denies any shortness of breath, lightheadedness, dizziness, palpitations, and lower extremity swelling.  Last dose of Eliquis  was 9/5 as she had a colonoscopy on Monday and was asked to hold prior to this and did not resume until today.  She did have  polyps removed during colonoscopy on 9/8.  Past Medical History:  Diagnosis Date   (HFpEF) heart failure with preserved ejection fraction (HCC)    a. 05/2018 Echo: EF 55-60%, no rwma, mild to mod MR. Nl RV fxn. Mod TR. PASP .   Arthritis    knees, Hands   Arthritis of knee    Back pain    Carotid arterial  disease (HCC)    a. 03/2019 Carotid U/S: <50% bilat ICA stenoses.   CHF (congestive heart failure) (HCC)    Cholelithiasis    a. 10/2018 noted on CT.   Edema, lower extremity    Fatty liver    GERD (gastroesophageal reflux disease)    History of stress test    a. 06/2018 MV: EF 59%, no ischemia/infarct. Low risk.   Hypertension    Knee pain    Lactose intolerance    Mitral regurgitation    a. 05/2018 Echo: mild to mod MR.   Multinodular goiter    Obesity    OSA (obstructive sleep apnea)    PAF (paroxysmal atrial fibrillation) (HCC)    a.  Diagnosed 12/19; b. 05/2018 s/p DCCV; c. 03/2019 & 05/2019 recurrent AFib-->managed w/ amio load; d. CHADS2VASc = 6 (CHF, HTN, age x 2, vascular disease, female)-->Eliquis  & amio 100 qd.   PAH (pulmonary artery hypertension) (HCC)    RSV (acute bronchiolitis due to respiratory syncytial virus) 05/10/2022   Scoliosis    SOB (shortness of breath)    Swallowing difficulty     Past Surgical History:  Procedure Laterality Date   AV NODE ABLATION N/A 07/05/2022   Procedure: AV NODE ABLATION;  Surgeon: Cindie Ole DASEN, MD;  Location: [facility];  Service: Cardiovascular;  Laterality: N/A;   CARDIOVERSION N/A 06/15/2018   Procedure: CARDIOVERSION (CATH LAB);  Surgeon: Perla Evalene PARAS, MD;  Location: [facility] ORS;  Service: Cardiovascular;  Laterality: N/A;   CARDIOVERSION N/A 02/18/2021   Procedure: CARDIOVERSION;  Surgeon: Perla Evalene PARAS, MD;  Location: [facility] ORS;  Service: Cardiovascular;  Laterality: N/A;   CATARACT EXTRACTION W/PHACO Right 01/25/2016   Procedure: CATARACT EXTRACTION PHACO AND INTRAOCULAR LENS PLACEMENT (IOC);  Surgeon: Donzell Arlyce Budd, MD;  Location: [facility];  Service: Ophthalmology;  Laterality: Right;  RIGHT   CATARACT EXTRACTION W/PHACO Left 02/22/2016   Procedure: CATARACT EXTRACTION PHACO AND INTRAOCULAR LENS PLACEMENT (IOC);  Surgeon: Donzell Arlyce Budd, MD;  Location: [facility];   Service: Ophthalmology;  Laterality: Left;  LEFT   COLONOSCOPY N/A 01/22/2024   Procedure: COLONOSCOPY;  Surgeon: Therisa Bi, MD;  Location: [facility] ENDOSCOPY;  Service: Gastroenterology;  Laterality: N/A;   HAMMER TOE SURGERY  05/16/2008   KNEE ARTHROSCOPY Right 05/16/2002   PACEMAKER IMPLANT N/A 10/25/2021   Procedure: PACEMAKER IMPLANT;  Surgeon: Cindie Ole DASEN, MD;  Location: [facility] INVAS[facility] CV LAB;  Service: Cardiovascular;  Laterality: N/A;   PARTIAL HIP ARTHROPLASTY Left 12/17/2021   POLYPECTOMY  01/22/2024   Procedure: POLYPECTOMY, INTESTINE;  Surgeon: Therisa Bi, MD;  Location: [facility] ENDOSCOPY;  Service: Gastroenterology;;   REPLACEMENT TOTAL KNEE Right 05/17/2007   [facility]   SKIN GRAFT Left 04/08/2013   Done on left index finger   TONSILLECTOMY  05/16/1944   TOTAL KNEE ARTHROPLASTY Left 12/02/2020       Scheduled Meds:  acidophilus  1 capsule Oral TID   allopurinol   100 mg Oral Daily   aspirin  EC  81 mg Oral Daily   atorvastatin   80 mg Oral Daily  cholecalciferol   1,000 Units Oral Daily   dapagliflozin  propanediol  10 mg Oral QAC breakfast   ezetimibe   10 mg Oral Daily   furosemide   40 mg Oral Daily   gabapentin   600 mg Oral QHS   metoprolol  tartrate  12.5 mg Oral BID   multivitamin with minerals  1 tablet Oral Daily   potassium chloride   10 mEq Oral Daily   potassium chloride   20 mEq Oral Once   Tafamidis   61 mg Oral Daily   Continuous Infusions:  heparin  1,150 Units/hr (01/24/24 1242)   PRN Meds: acetaminophen , ALPRAZolam , calcium  carbonate, magnesium  hydroxide, metaxalone , nitroGLYCERIN , ondansetron  (ZOFRAN ) IV, traZODone   Allergies:    Allergies  Allergen Reactions   Levofloxacin Other (See Comments)    Other reaction(s): Joint Pains   Influenza Vaccine Recombinant Other (See Comments)   Influenza Vaccines Other (See Comments)    Bell's Palsy   Oysters [Shellfish Allergy] Swelling    She states she had eaten them three days in a row  and she developed swelling around her eyes.     Social History:   Social History   Socioeconomic History   Marital status: Single    Spouse name: Not on file   Number of children: 5   Years of education: college   Highest education level: Bachelor's degree (e.g., BA, AB, BS)  Occupational History   Occupation: Lawyer: [employer]  Tobacco Use   Smoking status: Former    Current packs/day: 0.00    Average packs/day: 1 pack/day for 30.0 years (30.0 ttl pk-yrs)    Types: Cigarettes    Start date: 05/17/1959    Quit date: 05/16/1989    Years since quitting: 34.7    Passive exposure: Past   Smokeless tobacco: Never  Vaping Use   Vaping status: Never Used  Substance and Sexual Activity   Alcohol use: Yes    Alcohol/week: 14.0 standard drinks of alcohol    Types: 14 Glasses of wine per week   Drug use: No   Sexual activity: Not Currently  Other Topics Concern   Not on file  Social History Narrative   Pt has a child who passed away at age 92   Social Drivers of Health   Financial Resource Strain: Low Risk  (01/17/2024)   Overall Financial Resource Strain (CARDIA)    Difficulty of Paying Living Expenses: Not hard at all  Food Insecurity: No Food Insecurity (01/24/2024)   Hunger Vital Sign    Worried About Running Out of Food in the Last Year: Never true    Ran Out of Food in the Last Year: Never true  Transportation Needs: No Transportation Needs (01/24/2024)   PRAPARE - Administrator, Civil Service (Medical): No    Lack of Transportation (Non-Medical): No  Physical Activity: Inactive (01/17/2024)   Exercise Vital Sign    Days of Exercise per Week: 0 days    Minutes of Exercise per Session: 0 min  Stress: No Stress Concern Present (01/17/2024)   [employer] of Occupational Health - Occupational Stress Questionnaire    Feeling of Stress: Not at all  Social Connections: Moderately Integrated (01/24/2024)   Social Connection and  Isolation Panel    Frequency of Communication with Friends and Family: More than three times a week    Frequency of Social Gatherings with Friends and Family: More than three times a week    Attends Religious Services: More than 4 times per  year    Active Member of Clubs or Organizations: Yes    Attends Banker Meetings: More than 4 times per year    Marital Status: Divorced  Intimate Partner Violence: Not At Risk (01/24/2024)   Humiliation, Afraid, Rape, and Kick questionnaire    Fear of Current or Ex-Partner: No    Emotionally Abused: No    Physically Abused: No    Sexually Abused: No    Family History:    Family History  Problem Relation Age of Onset   Hyperlipidemia Sister    Atrial fibrillation Sister    Transient ischemic attack Mother    Heart disease Mother    Heart attack Father    High blood pressure Father    Stroke Father    Healthy Brother    Breast cancer Sister 60   Hyperlipidemia Sister    Atrial fibrillation Sister      ROS:  Please see the history of present illness.   All other ROS reviewed and negative.     Physical Exam/Data: Vitals:   01/24/24 0558 01/24/24 0822 01/24/24 0912 01/24/24 1144  BP: (!) 146/71 (!) 144/71 (!) 144/71 (!) 117/91  Pulse: 69 70 70 71  Resp: 20 18  18   Temp: 97.9 F (36.6 C) 98.2 F (36.8 C)  97.9 F (36.6 C)  TempSrc:    Oral  SpO2: 97% 96%  96%  Weight:      Height:        Intake/Output Summary (Last 24 hours) at 01/24/2024 1308 Last data filed at 01/24/2024 1242 Gross per 24 hour  Intake 992.69 ml  Output --  Net 992.69 ml      01/23/2024    8:03 PM 01/22/2024    7:59 AM 01/11/2024   10:00 AM  Last 3 Weights  Weight (lbs) 215 lb 215 lb 213 lb 12.8 oz  Weight (kg) 97.523 kg 97.523 kg 96.979 kg     Body mass index is 33.67 kg/m.  General:  Well nourished, well developed, in no acute distress HEENT: normal Neck: no JVD Vascular: No carotid bruits; Distal pulses 2+ bilaterally Cardiac:  normal  S1, S2; RRR; no murmur  Lungs:  clear to auscultation bilaterally, no wheezing, rhonchi or rales  Abd: soft, nontender, no hepatomegaly  Ext: no edema Skin: warm and dry  Psych:  Normal affect   EKG:  The EKG was personally reviewed and demonstrates:  atrial fibrillation, rate 75 bpm Telemetry:  Telemetry was personally reviewed and demonstrates: Atrial fibrillation with rate 70 to 90 bpm  Relevant CV Studies:  09/2022 Echo complete  1. Left ventricular ejection fraction, by estimation, is 60 to 65%. The  left ventricle has normal function. The left ventricle has no regional  wall motion abnormalities. There is mild left ventricular hypertrophy.  Left ventricular diastolic parameters  are indeterminate.   2. Right ventricular systolic function is normal. The right ventricular  size is normal. There is mildly elevated pulmonary artery systolic  pressure. The estimated right ventricular systolic pressure is 37.9 mmHg.   3. The mitral valve is normal in structure. Moderate mitral valve  regurgitation. No evidence of mitral stenosis.   4. Tricuspid valve regurgitation is moderate.   5. The aortic valve is tricuspid. Aortic valve regurgitation is not  visualized. No aortic stenosis is present.   6. The inferior vena cava is normal in size with greater than 50%  respiratory variability, suggesting right atrial pressure of 3 mmHg.   Laboratory Data:  High Sensitivity Troponin:   Recent Labs  Lab 01/23/24 2005 01/23/24 2153  TROPONINIHS 799* 5,728*     Chemistry Recent Labs  Lab 01/23/24 2005 01/24/24 0008  NA 140 141  K 3.2* 3.6  CL 102 102  CO2 27 27  GLUCOSE 139* 109*  BUN 25* 22  CREATININE 1.00 0.90  CALCIUM  9.4 9.5  GFRNONAA 56* >60  ANIONGAP 11 12    No results for input(s): PROT, ALBUMIN, AST, ALT, ALKPHOS, BILITOT in the last 168 hours. Lipids  Recent Labs  Lab 01/24/24 0008  CHOL 189  TRIG 49  HDL 60  LDLCALC 119*  CHOLHDL 3.2     Hematology Recent Labs  Lab 01/23/24 2005 01/24/24 0008  WBC 9.0 9.2  RBC 4.85 4.44  HGB 15.1* 13.7  HCT 45.7 41.2  MCV 94.2 92.8  MCH 31.1 30.9  MCHC 33.0 33.3  RDW 13.7 13.7  PLT 247 239   Thyroid  No results for input(s): TSH, FREET4 in the last 168 hours.  BNPNo results for input(s): BNP, PROBNP in the last 168 hours.  DDimer No results for input(s): DDIMER in the last 168 hours.  Radiology/Studies:  [facility] Chest Port 1 View Result Date: 01/23/2024 IMPRESSION: 1. No focal consolidation. 2. Cardiomegaly. Electronically Signed   By: Vanetta Chou M.D.   On: 01/23/2024 20:24   Assessment and Plan:  NSTEMI - Patient presented with sudden onset of chest pain at rest starting on the evening of 9/9 - Troponin up to 5728 - EKG without acute ischemic changes - Last dose of Eliquis  was 9/5 - Obtain echo - Continue IV heparin  - Recommend proceeding with cardiac catheterization tentatively scheduled for 9/11 - Continue aspirin  for now.  Can discontinue once patient has transitioned back to DOAC. - Continue atorvastatin  80 mg daily  Permanent atrial fibrillation Tachybrady syndrome s/p BiV PPM - Remains in atrial fibrillation with rates well-controlled - Most recent device check shows well-functioning device - Continue PTA metoprolol  tartrate 12.5 mg twice daily - Eliquis  on hold given pending cardiac catheterization, plan to resume prior to discharge  Chronic diastolic heart failure - Most recent echo 09/2022 showed EF 60 to 65% with indeterminate diastolic parameters - Patient is euvolemic on exam without symptoms of dyspnea - She is continued on PTA Farxiga , Lasix , metoprolol , and tafamidis   Informed Consent   Shared Decision Making/Informed Consent The risks [stroke (1 in 1000), death (1 in 1000), kidney failure [usually temporary] (1 in 500), bleeding (1 in 200), allergic reaction [possibly serious] (1 in 200)], benefits (diagnostic support and management of  coronary artery disease) and alternatives of a cardiac catheterization were discussed in detail with Lauren Lloyd and she is willing to proceed.     Risk Assessment/Risk Scores:    TIMI Risk Score for Unstable Angina or Non-ST Elevation MI:   [name] TIMI risk score is 3, which indicates a 13% risk of all cause mortality, new or recurrent myocardial infarction or need for urgent revascularization in the next 14 days.  For questions or updates, please contact [facility] Please consult [email or web address] for contact info under    Signed, Lesley LITTIE Maffucci, PA-C  01/24/2024 1:08 PM

## 2024-01-25 ENCOUNTER — Ambulatory Visit: Admitting: Pulmonary Disease

## 2024-01-25 ENCOUNTER — Encounter
Admission: EM | Disposition: A | Discharge status: Home / Self Care | Payer: Self-pay | Source: Home / Self Care | Attending: Internal Medicine

## 2024-01-25 DIAGNOSIS — I272 Pulmonary hypertension, unspecified: Secondary | ICD-10-CM | POA: Diagnosis not present

## 2024-01-25 DIAGNOSIS — I2510 Atherosclerotic heart disease of native coronary artery without angina pectoris: Secondary | ICD-10-CM | POA: Diagnosis not present

## 2024-01-25 DIAGNOSIS — R0602 Shortness of breath: Secondary | ICD-10-CM | POA: Diagnosis not present

## 2024-01-25 DIAGNOSIS — I214 Non-ST elevation (NSTEMI) myocardial infarction: Secondary | ICD-10-CM | POA: Diagnosis not present

## 2024-01-25 DIAGNOSIS — I4821 Permanent atrial fibrillation: Secondary | ICD-10-CM | POA: Diagnosis not present

## 2024-01-25 HISTORY — PX: LEFT HEART CATH AND CORONARY ANGIOGRAPHY: CATH118249

## 2024-01-25 LAB — CBC
HCT: 42.8 % (ref 36.0–46.0)
Hemoglobin: 14.2 g/dL (ref 12.0–15.0)
MCH: 31.3 pg (ref 26.0–34.0)
MCHC: 33.2 g/dL (ref 30.0–36.0)
MCV: 94.3 fL (ref 80.0–100.0)
Platelets: 217 K/uL (ref 150–400)
RBC: 4.54 MIL/uL (ref 3.87–5.11)
RDW: 13.9 % (ref 11.5–15.5)
WBC: 10 K/uL (ref 4.0–10.5)
nRBC: 0 % (ref 0.0–0.2)

## 2024-01-25 LAB — BASIC METABOLIC PANEL WITH GFR
Anion gap: 10 (ref 5–15)
BUN: 21 mg/dL (ref 8–23)
CO2: 24 mmol/L (ref 22–32)
Calcium: 8.9 mg/dL (ref 8.9–10.3)
Chloride: 107 mmol/L (ref 98–111)
Creatinine, Ser: 0.84 mg/dL (ref 0.44–1.00)
GFR, Estimated: >60 mL/min (ref >60)
Glucose, Bld: 97 mg/dL (ref 70–99)
Potassium: 4 mmol/L (ref 3.5–5.1)
Sodium: 141 mmol/L (ref 135–145)

## 2024-01-25 LAB — HEPARIN LEVEL (UNFRACTIONATED): Heparin Unfractionated: 0.36 [IU]/mL (ref 0.30–0.70)

## 2024-01-25 LAB — LIPOPROTEIN A (LPA): Lipoprotein (a): 67.8 nmol/L — ABNORMAL HIGH (ref <75.0)

## 2024-01-25 SURGERY — LEFT HEART CATH AND CORONARY ANGIOGRAPHY
Anesthesia: Moderate Sedation

## 2024-01-25 MED ORDER — SODIUM CHLORIDE 0.9% FLUSH: 3.0000 mL | INTRAVENOUS | Status: DC | PRN

## 2024-01-25 MED ORDER — FENTANYL CITRATE (PF) 100 MCG/2ML IJ SOLN
INTRAMUSCULAR | Status: AC
  Filled 2024-01-25: qty 2

## 2024-01-25 MED ORDER — FREE WATER
500.0000 mL | Freq: Once | Status: DC
Start: 2024-01-25 — End: 2024-01-26

## 2024-01-25 MED ORDER — HYDRALAZINE HCL 20 MG/ML IJ SOLN: 10.0000 mg | INTRAMUSCULAR | Status: AC | PRN

## 2024-01-25 MED ORDER — LIDOCAINE HCL (PF) 1 % IJ SOLN
INTRAMUSCULAR | Status: DC | PRN
  Administered 2024-01-25: 2 mL

## 2024-01-25 MED ORDER — MIDAZOLAM HCL 2 MG/2ML IJ SOLN
INTRAMUSCULAR | Status: AC
  Filled 2024-01-25: qty 2

## 2024-01-25 MED ORDER — HEPARIN SODIUM (PORCINE) 1000 UNIT/ML IJ SOLN
INTRAMUSCULAR | Status: AC
  Filled 2024-01-25: qty 10

## 2024-01-25 MED ORDER — MIDAZOLAM HCL 2 MG/2ML IJ SOLN
INTRAMUSCULAR | Status: DC | PRN
  Administered 2024-01-25: 1 mg via INTRAVENOUS

## 2024-01-25 MED ORDER — IOHEXOL 300 MG/ML  SOLN
INTRAMUSCULAR | Status: DC | PRN
  Administered 2024-01-25: 55 mL

## 2024-01-25 MED ORDER — CLOPIDOGREL BISULFATE 75 MG PO TABS
300.0000 mg | ORAL_TABLET | Freq: Once | ORAL | Status: AC
Start: 2024-01-25 — End: 2024-01-25
  Administered 2024-01-25: 300 mg via ORAL
  Filled 2024-01-25: qty 4

## 2024-01-25 MED ORDER — HEPARIN (PORCINE) IN NACL 2000-0.9 UNIT/L-% IV SOLN
INTRAVENOUS | Status: DC | PRN
  Administered 2024-01-25: 1000 mL

## 2024-01-25 MED ORDER — VERAPAMIL HCL 2.5 MG/ML IV SOLN
INTRAVENOUS | Status: DC | PRN
  Administered 2024-01-25: 2.5 mg via INTRAVENOUS

## 2024-01-25 MED ORDER — NITROGLYCERIN 1 MG/10 ML FOR IR/CATH LAB
INTRA_ARTERIAL | Status: DC | PRN
  Administered 2024-01-25: 200 ug

## 2024-01-25 MED ORDER — SODIUM CHLORIDE 0.9% FLUSH
3.0000 mL | Freq: Two times a day (BID) | INTRAVENOUS | Status: DC
Start: 2024-01-25 — End: 2024-01-26
  Administered 2024-01-25 – 2024-01-26 (×2): 3 mL via INTRAVENOUS

## 2024-01-25 MED ORDER — CLOPIDOGREL BISULFATE 75 MG PO TABS
75.0000 mg | ORAL_TABLET | Freq: Every day | ORAL | Status: DC
Start: 2024-01-26 — End: 2024-01-26
  Administered 2024-01-26: 75 mg via ORAL
  Filled 2024-01-25: qty 1

## 2024-01-25 MED ORDER — HEPARIN SODIUM (PORCINE) 1000 UNIT/ML IJ SOLN
INTRAMUSCULAR | Status: DC | PRN
  Administered 2024-01-25: 5000 [IU] via INTRAVENOUS

## 2024-01-25 MED ORDER — FENTANYL CITRATE (PF) 100 MCG/2ML IJ SOLN
INTRAMUSCULAR | Status: DC | PRN
  Administered 2024-01-25: 25 ug via INTRAVENOUS

## 2024-01-25 MED ORDER — NITROGLYCERIN 1 MG/10 ML FOR IR/CATH LAB
INTRA_ARTERIAL | Status: AC
  Filled 2024-01-25: qty 10

## 2024-01-25 MED ORDER — SODIUM CHLORIDE 0.9 % IV SOLN: INTRAVENOUS | Status: DC

## 2024-01-25 MED ORDER — LIDOCAINE HCL 1 % IJ SOLN
INTRAMUSCULAR | Status: AC
Start: 2024-01-25 — End: 2024-01-25
  Filled 2024-01-25: qty 20

## 2024-01-25 MED ORDER — LABETALOL HCL 5 MG/ML IV SOLN: 10.0000 mg | INTRAVENOUS | Status: AC | PRN

## 2024-01-25 MED ORDER — SODIUM CHLORIDE 0.9 % IV SOLN: 250.0000 mL | INTRAVENOUS | Status: DC | PRN

## 2024-01-25 MED ORDER — VERAPAMIL HCL 2.5 MG/ML IV SOLN
INTRAVENOUS | Status: AC
  Filled 2024-01-25: qty 2

## 2024-01-25 SURGICAL SUPPLY — 9 items
CATH INFINITI 5 FR JL3.5 (CATHETERS) IMPLANT
CATH INFINITI AMBI 5FR TG (CATHETERS) IMPLANT
DEVICE RAD TR BAND REGULAR (VASCULAR PRODUCTS) IMPLANT
DRAPE BRACHIAL (DRAPES) IMPLANT
GLIDESHEATH SLEND SS 6F .021 (SHEATH) IMPLANT
GUIDEWIRE INQWIRE 1.5J.035X260 (WIRE) IMPLANT
PACK CARDIAC CATH (CUSTOM PROCEDURE TRAY) ×1 IMPLANT
SET ATX-X65L (MISCELLANEOUS) IMPLANT
STATION PROTECTION PRESSURIZED (MISCELLANEOUS) IMPLANT

## 2024-01-25 NOTE — Interval H&P Note (Signed)
 History and Physical Interval Note:  01/25/2024 2:39 PM  Lauren Lloyd  has presented today for surgery, with the diagnosis of non ST elevation myocardial infarction.  The various methods of treatment have been discussed with the patient and family. After consideration of risks, benefits and other options for treatment, the patient has consented to  Procedure(s): LEFT HEART CATH AND CORONARY ANGIOGRAPHY (N/A)  PERCUTANEOUS CORONARY INTERVENTION   as a surgical intervention.  [name] history has been reviewed, patient examined, no change in status, stable for surgery.  I have reviewed the patient's chart and labs.  Questions were answered to the patient's satisfaction.    Cath Lab Visit (complete for each Cath Lab visit)  Clinical Evaluation Leading to the Procedure:   ACS: Yes.    Non-ACS:    Anginal Classification: CCS IV  Anti-ischemic medical therapy: Minimal Therapy (1 class of medications)  Non-Invasive Test Results: No non-invasive testing performed  Prior CABG: No previous CABG     Alm Clay

## 2024-01-25 NOTE — H&P (View-Only) (Signed)
 Cardiology Progress Note   Patient Name: Lauren Lloyd Date of Encounter: 01/25/2024  Primary Cardiologist: Evalene Lunger, MD  Subjective   Feels well this AM.  No chest pain or dyspnea.  For diagnostic catheterization today.  Questions answered. Objective   Inpatient Medications    Scheduled Meds:  acidophilus  1 capsule Oral TID   allopurinol   100 mg Oral Daily   aspirin   81 mg Oral Pre-Cath   aspirin  EC  81 mg Oral Daily   atorvastatin   80 mg Oral Daily   cholecalciferol   1,000 Units Oral Daily   dapagliflozin  propanediol  10 mg Oral QAC breakfast   ezetimibe   10 mg Oral Daily   furosemide   40 mg Oral Daily   gabapentin   600 mg Oral QHS   metoprolol  tartrate  12.5 mg Oral BID   multivitamin with minerals  1 tablet Oral Daily   potassium chloride   10 mEq Oral Daily   Tafamidis   61 mg Oral Daily   Continuous Infusions:  heparin  1,150 Units/hr (01/24/24 2305)   PRN Meds: acetaminophen , ALPRAZolam , calcium  carbonate, magnesium  hydroxide, metaxalone , nitroGLYCERIN , ondansetron  (ZOFRAN ) IV, traZODone    Vital Signs    Vitals:   01/24/24 1548 01/24/24 1942 01/24/24 2315 01/25/24 0412  BP: (!) 140/76 119/62 136/72 126/89  Pulse: 71 68 70   Resp: 18 20 20 20   Temp: 97.6 F (36.4 C) 98.2 F (36.8 C) 98.3 F (36.8 C) 98.1 F (36.7 C)  TempSrc:      SpO2: 96% 97% 96% 91%  Weight:      Height:        Intake/Output Summary (Last 24 hours) at 01/25/2024 0741 Last data filed at 01/25/2024 0455 Gross per 24 hour  Intake 1839.01 ml  Output --  Net 1839.01 ml   Filed Weights   01/23/24 2003  Weight: 97.5 kg    Physical Exam   GEN: Well nourished, well developed, in no acute distress.  HEENT: Grossly normal.  Neck: Supple, no JVD, carotid bruits, or masses. Cardiac: RRR, no murmurs, rubs, or gallops. No clubbing, cyanosis, edema.  Radials 2+, DP/PT 2+ and equal bilaterally.  Respiratory:  Respirations regular and unlabored, clear to auscultation  bilaterally. GI: Soft, nontender, nondistended, BS + x 4. MS: no deformity or atrophy. Skin: warm and dry, no rash. Neuro:  Strength and sensation are intact. Psych: AAOx3.  Normal affect.  Labs    Chemistry Recent Labs  Lab 01/23/24 2005 01/24/24 0008 01/25/24 0401  NA 140 141 141  K 3.2* 3.6 4.0  CL 102 102 107  CO2 27 27 24   GLUCOSE 139* 109* 97  BUN 25* 22 21  CREATININE 1.00 0.90 0.84  CALCIUM  9.4 9.5 8.9  GFRNONAA 56* >60 >60  ANIONGAP 11 12 10      Hematology Recent Labs  Lab 01/23/24 2005 01/24/24 0008 01/25/24 0401  WBC 9.0 9.2 10.0  RBC 4.85 4.44 4.54  HGB 15.1* 13.7 14.2  HCT 45.7 41.2 42.8  MCV 94.2 92.8 94.3  MCH 31.1 30.9 31.3  MCHC 33.0 33.3 33.2  RDW 13.7 13.7 13.9  PLT 247 239 217    Cardiac Enzymes  Recent Labs  Lab 01/23/24 2005 01/23/24 2153  TROPONINIHS 799* 5,728*     Lipids  Lab Results  Component Value Date   CHOL 189 01/24/2024   HDL 60 01/24/2024   LDLCALC 119 (H) 01/24/2024   TRIG 49 01/24/2024   CHOLHDL 3.2 01/24/2024    HbA1c  Lab Results  Component Value Date   HGBA1C 5.5 01/11/2024    Radiology    DG Chest Port 1 View Result Date: 01/23/2024 CLINICAL DATA:  Chest pain. EXAM: PORTABLE CHEST 1 VIEW COMPARISON:  Chest radiograph dated 05/26/2023. FINDINGS: No focal consolidation, pleural effusion or pneumothorax. Diffuse interstitial reticular coarsening, likely chronic. Atypical infection is not excluded. Stable cardiomegaly. No acute osseous pathology. Left pectoral pacemaker device. IMPRESSION: 1. No focal consolidation. 2. Cardiomegaly. Electronically Signed   By: Vanetta Chou M.D.   On: 01/23/2024 20:24     Telemetry    V paced at 74 with underlying atrial fibrillation- Personally Reviewed  Cardiac Studies   2D Echocardiogram 9.10.2025   1. Left ventricular ejection fraction, by estimation, is 50 to 55%. The  left ventricle has low normal function. The left ventricle demonstrates  regional wall  motion abnormalities (hypokinesis of the mid to distal  anterior, anteroseptal and apical  region). There is moderate left ventricular hypertrophy. Left ventricular  diastolic parameters are indeterminate.   2. Right ventricular systolic function is normal. The right ventricular  size is normal.   3. Left atrial size was mildly dilated.   4. The mitral valve is normal in structure. Mild mitral valve  regurgitation. No evidence of mitral stenosis.   5. The aortic valve is tricuspid. Aortic valve regurgitation is not  visualized. No aortic stenosis is present.   6. The inferior vena cava is normal in size with greater than 50%  respiratory variability, suggesting right atrial pressure of 3 mmHg.  _____________   Patient Profile     83 y.o. female w/a  h/o permanent atrial fibrillation status post AV nodal ablation and biventricular pacemaker, chronic HFpEF/MGUS on tafamidis , hypertension, pulmonary hypertension, sleep apnea, and COPD, who was admitted September 9 with chest pain and non-STEMI.  Echo September 10 with an EF of 50-55% with hypokinesis of the mid to distal anterior, anteroseptal, and apical regions  Assessment & Plan    1.  NSTEMI:  hsTrop up to 5728 on 9/9.  Echo 9/10 w/ EF 50-55% and new hypokinesis of the mid to distal anterior, anteroseptal and apical region.  Doing well without chest pain or dyspnea.  For diagnostic catheterization today.  All questions answered.  Cont asa, statin, ? blocker, heparin .  The patient understands that risks include but are not limited to stroke (1 in 1000), death (1 in 1000), kidney failure [usually temporary] (1 in 500), bleeding (1 in 200), allergic reaction [possibly serious] (1 in 200), and agrees to proceed.    2.  Permanent Afib/Tachybrady syndrome:  S/p AVN ablation and BiV PPM.  V paced on tele w/ 5 beats NSVT.  Elquis on hold since 9/5 as pt was prev pending a colonoscopy.  Cont heparin  and beta-blocker therapy.  3.  Chronic HFpEF/MGUS:   EF 50-55% by echo this admission.  PRior PYP scan +.  Followed in AHF clinic - on tafamadis.  Output not recorded.  No edema on examination and appears to be euvolemic.  Heart rate and blood pressure stable.  Continue tafamidis , beta-blocker, Lasix , and SGLT2 inhibitor.  4.  COPD/Emphysema: Lungs are clear.  5.  Pulmonary HTN:  RVSP 37.9 mmHg on echo in 09/2022.  Nl RV fxn on echo 9/10.  See #3.  6.  OSA: Uses CPAP at home.  Did not have last night.  I have ordered.  7.  Moderate MR: Noted on echo.  I do not appreciate a murmur on examination.  Will require outpatient  serial follow-up.  8.  Hyperlipidemia: ALT 119.  Continue high potency statin and Zetia  therapy.  Signed, Lonni Meager, NP  01/25/2024, 7:41 AM    For questions or updates, please contact   Please consult [email or web address] for contact info under Cardiology/STEMI.

## 2024-01-25 NOTE — Consult Note (Signed)
 Pharmacy Consult Note - Anticoagulation  Pharmacy Consult for heparin  Indication: chest pain/ACS  PATIENT MEASUREMENTS: Height: 5' 7 (170.2 cm) Weight: 97.5 kg (215 lb) IBW/kg (Calculated) : 61.6 HEPARIN  DW (KG): 83.2  VITAL SIGNS: Temp: 98.3 F (36.8 C) (09/10 2315) BP: 136/72 (09/10 2315) Pulse Rate: 70 (09/10 2315)  Recent Labs    01/23/24 2153 01/24/24 0008 01/24/24 0613 01/25/24 0021  HGB  --  13.7  --   --   HCT  --  41.2  --   --   PLT  --  239  --   --   APTT 30  --   --   --   LABPROT 13.7  --   --   --   INR 1.0  --   --   --   HEPARINUNFRC  --  0.43   < > 0.36  CREATININE  --  0.90  --   --   TROPONINIHS 5,728*  --   --   --    < > = values in this interval not displayed.    Estimated Creatinine Clearance: 55.8 mL/min (by C-G formula based on SCr of 0.9 mg/dL).  PAST MEDICAL HISTORY: Past Medical History:  Diagnosis Date   (HFpEF) heart failure with preserved ejection fraction (HCC)    a. 05/2018 Echo: EF 55-60%, no rwma, mild to mod MR. Nl RV fxn. Mod TR. PASP .   Arthritis    knees, Hands   Arthritis of knee    Back pain    Carotid arterial disease (HCC)    a. 03/2019 Carotid U/S: <50% bilat ICA stenoses.   CHF (congestive heart failure) (HCC)    Cholelithiasis    a. 10/2018 noted on CT.   Edema, lower extremity    Fatty liver    GERD (gastroesophageal reflux disease)    History of stress test    a. 06/2018 MV: EF 59%, no ischemia/infarct. Low risk.   Hypertension    Knee pain    Lactose intolerance    Mitral regurgitation    a. 05/2018 Echo: mild to mod MR.   Multinodular goiter    Obesity    OSA (obstructive sleep apnea)    PAF (paroxysmal atrial fibrillation) (HCC)    a.  Diagnosed 12/19; b. 05/2018 s/p DCCV; c. 03/2019 & 05/2019 recurrent AFib-->managed w/ amio load; d. CHADS2VASc = 6 (CHF, HTN, age x 2, vascular disease, female)-->Eliquis  & amio 100 qd.   PAH (pulmonary artery hypertension) (HCC)    RSV (acute bronchiolitis due to  respiratory syncytial virus) 05/10/2022   Scoliosis    SOB (shortness of breath)    Swallowing difficulty     ASSESSMENT: 83 y.o. 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 cTn trending up, most recently at 5,728. Patient's last dose of Eliquis  was on 01/19/2024 because it had been held for a colonoscopy. CHA2DS2VASc is 6 (CHF, HTN, age+2, CAD, female sex.)   Pharmacy has been consulted to initiate and manage heparin  intravenous infusion.  Pertinent medications: Eliquis  5mg  PO twice daily -- last dose was on 01/19/2024  Goal(s) of therapy: Heparin  level 0.3 - 0.7 units/mL aPTT 66 - 102 seconds Monitor platelets by anticoagulation protocol: Yes   Baseline anticoagulation labs: Recent Labs    01/23/24 2005 01/23/24 2153 01/24/24 0008  APTT  --  30  --   INR  --  1.0  --   HGB 15.1*  --  13.7  PLT 247  --  239    Date Time aPTT/HL Rate/Comment 9/10 0613 0.29  SUBtherapeutic; 1000 units/hr 9/10 1605 0.45  Therapeutic x 1; 1150 units/hr 9/11     0021    0.36                 Therapeutic X 2    PLAN:  NOTE:  LDA documenation says pt had epidural catheter removed on 9/9 @ 2037.   RN double checked and stated that pt did NOT have epidural PTA and has not had an epidural catheter removed since her arrival.  The documented catheter removal is incorrect but is causing false alarm with anticoag / epidural BPA.  9/11:  HL @ 0021 = 0.36, therapeutic X 2 Continue heparin  at 1150 units/hr Recheck HL in 24 hrs on 9/11 @ 2200.  Continue to monitor CBC daily while on heparin  infusion.   Paislynn Hegstrom D Clinical Pharmacist 01/25/2024 1:09 AM

## 2024-01-25 NOTE — Progress Notes (Signed)
 Cardiology Progress Note   Patient Name: Lauren Lloyd Date of Encounter: 01/25/2024  Primary Cardiologist: Evalene Lunger, MD  Subjective   Feels well this AM.  No chest pain or dyspnea.  For diagnostic catheterization today.  Questions answered. Objective   Inpatient Medications    Scheduled Meds:  acidophilus  1 capsule Oral TID   allopurinol   100 mg Oral Daily   aspirin   81 mg Oral Pre-Cath   aspirin  EC  81 mg Oral Daily   atorvastatin   80 mg Oral Daily   cholecalciferol   1,000 Units Oral Daily   dapagliflozin  propanediol  10 mg Oral QAC breakfast   ezetimibe   10 mg Oral Daily   furosemide   40 mg Oral Daily   gabapentin   600 mg Oral QHS   metoprolol  tartrate  12.5 mg Oral BID   multivitamin with minerals  1 tablet Oral Daily   potassium chloride   10 mEq Oral Daily   Tafamidis   61 mg Oral Daily   Continuous Infusions:  heparin  1,150 Units/hr (01/24/24 2305)   PRN Meds: acetaminophen , ALPRAZolam , calcium  carbonate, magnesium  hydroxide, metaxalone , nitroGLYCERIN , ondansetron  (ZOFRAN ) IV, traZODone    Vital Signs    Vitals:   01/24/24 1548 01/24/24 1942 01/24/24 2315 01/25/24 0412  BP: (!) 140/76 119/62 136/72 126/89  Pulse: 71 68 70   Resp: 18 20 20 20   Temp: 97.6 F (36.4 C) 98.2 F (36.8 C) 98.3 F (36.8 C) 98.1 F (36.7 C)  TempSrc:      SpO2: 96% 97% 96% 91%  Weight:      Height:        Intake/Output Summary (Last 24 hours) at 01/25/2024 0741 Last data filed at 01/25/2024 0455 Gross per 24 hour  Intake 1839.01 ml  Output --  Net 1839.01 ml   Filed Weights   01/23/24 2003  Weight: 97.5 kg    Physical Exam   GEN: Well nourished, well developed, in no acute distress.  HEENT: Grossly normal.  Neck: Supple, no JVD, carotid bruits, or masses. Cardiac: RRR, no murmurs, rubs, or gallops. No clubbing, cyanosis, edema.  Radials 2+, DP/PT 2+ and equal bilaterally.  Respiratory:  Respirations regular and unlabored, clear to auscultation  bilaterally. GI: Soft, nontender, nondistended, BS + x 4. MS: no deformity or atrophy. Skin: warm and dry, no rash. Neuro:  Strength and sensation are intact. Psych: AAOx3.  Normal affect.  Labs    Chemistry Recent Labs  Lab 01/23/24 2005 01/24/24 0008 01/25/24 0401  NA 140 141 141  K 3.2* 3.6 4.0  CL 102 102 107  CO2 27 27 24   GLUCOSE 139* 109* 97  BUN 25* 22 21  CREATININE 1.00 0.90 0.84  CALCIUM  9.4 9.5 8.9  GFRNONAA 56* >60 >60  ANIONGAP 11 12 10      Hematology Recent Labs  Lab 01/23/24 2005 01/24/24 0008 01/25/24 0401  WBC 9.0 9.2 10.0  RBC 4.85 4.44 4.54  HGB 15.1* 13.7 14.2  HCT 45.7 41.2 42.8  MCV 94.2 92.8 94.3  MCH 31.1 30.9 31.3  MCHC 33.0 33.3 33.2  RDW 13.7 13.7 13.9  PLT 247 239 217    Cardiac Enzymes  Recent Labs  Lab 01/23/24 2005 01/23/24 2153  TROPONINIHS 799* 5,728*     Lipids  Lab Results  Component Value Date   CHOL 189 01/24/2024   HDL 60 01/24/2024   LDLCALC 119 (H) 01/24/2024   TRIG 49 01/24/2024   CHOLHDL 3.2 01/24/2024    HbA1c  Lab Results  Component Value Date   HGBA1C 5.5 01/11/2024    Radiology    DG Chest Port 1 View Result Date: 01/23/2024 CLINICAL DATA:  Chest pain. EXAM: PORTABLE CHEST 1 VIEW COMPARISON:  Chest radiograph dated 05/26/2023. FINDINGS: No focal consolidation, pleural effusion or pneumothorax. Diffuse interstitial reticular coarsening, likely chronic. Atypical infection is not excluded. Stable cardiomegaly. No acute osseous pathology. Left pectoral pacemaker device. IMPRESSION: 1. No focal consolidation. 2. Cardiomegaly. Electronically Signed   By: Vanetta Chou M.D.   On: 01/23/2024 20:24     Telemetry    V paced at 74 with underlying atrial fibrillation- Personally Reviewed  Cardiac Studies   2D Echocardiogram 9.10.2025   1. Left ventricular ejection fraction, by estimation, is 50 to 55%. The  left ventricle has low normal function. The left ventricle demonstrates  regional wall  motion abnormalities (hypokinesis of the mid to distal  anterior, anteroseptal and apical  region). There is moderate left ventricular hypertrophy. Left ventricular  diastolic parameters are indeterminate.   2. Right ventricular systolic function is normal. The right ventricular  size is normal.   3. Left atrial size was mildly dilated.   4. The mitral valve is normal in structure. Mild mitral valve  regurgitation. No evidence of mitral stenosis.   5. The aortic valve is tricuspid. Aortic valve regurgitation is not  visualized. No aortic stenosis is present.   6. The inferior vena cava is normal in size with greater than 50%  respiratory variability, suggesting right atrial pressure of 3 mmHg.  _____________   Patient Profile     83 y.o. female w/a  h/o permanent atrial fibrillation status post AV nodal ablation and biventricular pacemaker, chronic HFpEF/MGUS on tafamidis , hypertension, pulmonary hypertension, sleep apnea, and COPD, who was admitted September 9 with chest pain and non-STEMI.  Echo September 10 with an EF of 50-55% with hypokinesis of the mid to distal anterior, anteroseptal, and apical regions  Assessment & Plan    1.  NSTEMI:  hsTrop up to 5728 on 9/9.  Echo 9/10 w/ EF 50-55% and new hypokinesis of the mid to distal anterior, anteroseptal and apical region.  Doing well without chest pain or dyspnea.  For diagnostic catheterization today.  All questions answered.  Cont asa, statin, ? blocker, heparin .  The patient understands that risks include but are not limited to stroke (1 in 1000), death (1 in 1000), kidney failure [usually temporary] (1 in 500), bleeding (1 in 200), allergic reaction [possibly serious] (1 in 200), and agrees to proceed.    2.  Permanent Afib/Tachybrady syndrome:  S/p AVN ablation and BiV PPM.  V paced on tele w/ 5 beats NSVT.  Elquis on hold since 9/5 as pt was prev pending a colonoscopy.  Cont heparin  and beta-blocker therapy.  3.  Chronic HFpEF/MGUS:   EF 50-55% by echo this admission.  PRior PYP scan +.  Followed in AHF clinic - on tafamadis.  Output not recorded.  No edema on examination and appears to be euvolemic.  Heart rate and blood pressure stable.  Continue tafamidis , beta-blocker, Lasix , and SGLT2 inhibitor.  4.  COPD/Emphysema: Lungs are clear.  5.  Pulmonary HTN:  RVSP 37.9 mmHg on echo in 09/2022.  Nl RV fxn on echo 9/10.  See #3.  6.  OSA: Uses CPAP at home.  Did not have last night.  I have ordered.  7.  Moderate MR: Noted on echo.  I do not appreciate a murmur on examination.  Will require outpatient  serial follow-up.  8.  Hyperlipidemia: ALT 119.  Continue high potency statin and Zetia  therapy.  Signed, Lonni Meager, NP  01/25/2024, 7:41 AM    For questions or updates, please contact   Please consult [email or web address] for contact info under Cardiology/STEMI.

## 2024-01-25 NOTE — Progress Notes (Signed)
 Progress Note   Patient: Lauren Lloyd FMW:[id number] DOB: Apr 02, 1941 DOA: 01/23/2024     2 DOS: the patient was seen and examined on 01/25/2024   Brief hospital course: 83yo with h/o carotid artery disease, chronic HFpEF, HTN, OSA, and PAF who presented on 9/9 with chest pain.  She was found to have an NSTEMI with troponin 799 -> 5728.  She was started on heparin  with cardiology consultation and likely need for cardiac cath.  Assessment and Plan:  NSTEMI (non-ST elevated myocardial infarction)  Acute onset of left-sided CP while at rest, atypical presentation Markedly elevated troponin c/w NSTEMI Admitted to a progressive unit bed Continue IV heparin  Started on aspirin  as well as as needed sublingual glycerin and IV morphine  sulfate for pain Started on beta-blocker therapy 2D echo on 9/10 with EF 50-55%, +WMA Cardiology consulted Planned for cardiac cath on 9/11   Dyslipidemia Will continue Zetia  and the patient will be placed on high-dose statin   Chronic diastolic CHF (congestive heart failure)  Will continue Demadex and Farxiga  Echo ordered   Paroxysmal atrial fibrillation  The patient will be on IV heparin  to replace Eliquis  which has been held for 5 days given her colonoscopy on 9/7 No longer on bisoprolol    Gout Continue allopurinol    Peripheral neuropathy Continue Neurontin    Class 1 obesity Body mass index is 33.67 kg/m.SABRA  Weight loss should be encouraged Outpatient PCP/bariatric medicine f/u encouraged Significantly low or high BMI is associated with higher medical risk including morbidity and mortality    DNR DNR confirmed at the time of admission Patient will need a gold out of facility DNR form at the time of discharge         Consultants: Cardiology   Procedures: Echocardiogram 9/10 LHC 9/11   Antibiotics: None  30 Day Unplanned Readmission Risk Score    Flowsheet Row ED to Hosp-Admission (Current) from 01/23/2024 in [facility]  MED PCU  30 Day Unplanned Readmission Risk Score (%) 10.75 Filed at 01/25/2024 0801    This score is the patient's risk of an unplanned readmission within 30 days of being discharged (0 -100%). The score is based on dignosis, age, lab data, medications, orders, and past utilization.   Low:  0-14.9   Medium: 15-21.9   High: 22-29.9   Extreme: 30 and above           Subjective: Feeling fine.  Ambulating around the unit without difficulty.  No CP.   Objective: Vitals:   01/24/24 2315 01/25/24 0412  BP: 136/72 126/89  Pulse: 70   Resp: 20 20  Temp: 98.3 F (36.8 C) 98.1 F (36.7 C)  SpO2: 96% 91%    Intake/Output Summary (Last 24 hours) at 01/25/2024 9177 Last data filed at 01/25/2024 0455 Gross per 24 hour  Intake 1839.01 ml  Output --  Net 1839.01 ml   Filed Weights   01/23/24 2003  Weight: 97.5 kg    Exam:  General:  Appears calm and comfortable and is in NAD Eyes:  normal lids, iris ENT:  grossly normal hearing, lips & tongue, mmm Cardiovascular:  RRR. No LE edema.  Respiratory:   CTA bilaterally with no wheezes/rales/rhonchi.  Normal respiratory effort. Abdomen:  soft, NT, ND Skin:  no rash or induration seen on limited exam Musculoskeletal:  grossly normal tone BUE/BLE, good ROM, no bony abnormality Psychiatric:  grossly normal mood and affect, speech fluent and appropriate, AOx3 Neurologic:  CN 2-12 grossly intact, moves all extremities in coordinated  fashion  Data Reviewed: I have reviewed the patient's lab results since admission.  Pertinent labs for today include:   Normal BMP Normal CBC     Family Communication: None present       Code Status: Do not attempt resuscitation (DNR) PRE-ARREST INTERVENTIONS DESIRED    Disposition: Status is: Inpatient Remains inpatient appropriate because: cath today     Time spent: 50 minutes  Unresulted Labs (From admission, onward)     Start     Ordered   01/25/24 2200  Heparin  level  (unfractionated)  Once-Timed,   TIMED       Question:  Specimen collection method  Answer:  Lab=Lab collect   01/25/24 0109             Author: Delon Herald, MD 01/25/2024 8:22 AM  For on call review [email or web address].

## 2024-01-25 NOTE — Care Management Important Message (Signed)
 Important Message  Patient Details  Name: Lauren Lloyd MRN: [id number] Date of Birth: 05-04-1941   Important Message Given:  Yes - Medicare IM     [name] 01/25/2024, 12:40 PM

## 2024-01-25 NOTE — TOC CM/SW Note (Signed)
 Transition of Care [facility]) - Inpatient Brief Assessment   Patient Details  Name: Lauren Lloyd MRN: [id number] Date of Birth: Jun 27, 1940  [facility] CM/SW Contact:    Victory Jackquline RAMAN, RN Phone Number: [phone] 01/25/2024, 4:01 PM   Clinical Narrative: RNCM reviewed chart, no TOC needs at this time.Will continue to follow for D/C Planning/Care Coordination and update as applicable.    Transition of Care Asessment: Insurance and Status: Insurance coverage has been reviewed Patient has primary care physician: Yes Home environment has been reviewed: Single Family Home Prior level of function:: Not documented Prior/Current Home Services: No current home services Social Drivers of Health Review: SDOH reviewed no interventions necessary Readmission risk has been reviewed: Yes Transition of care needs: no transition of care needs at this time

## 2024-01-26 ENCOUNTER — Encounter: Payer: Self-pay | Admitting: Cardiology

## 2024-01-26 DIAGNOSIS — I214 Non-ST elevation (NSTEMI) myocardial infarction: Secondary | ICD-10-CM | POA: Diagnosis not present

## 2024-01-26 MED ORDER — NITROGLYCERIN 0.4 MG SL SUBL: 0.4000 mg | SUBLINGUAL_TABLET | SUBLINGUAL | 12 refills | Status: AC | PRN

## 2024-01-26 MED ORDER — CLOPIDOGREL BISULFATE 75 MG PO TABS: 75.0000 mg | ORAL_TABLET | Freq: Every day | ORAL | 0 refills | Status: DC

## 2024-01-26 MED ORDER — ATORVASTATIN CALCIUM 80 MG PO TABS: 80.0000 mg | ORAL_TABLET | Freq: Every day | ORAL | 0 refills | Status: DC

## 2024-01-26 MED ORDER — METOPROLOL TARTRATE 25 MG PO TABS: 12.5000 mg | ORAL_TABLET | Freq: Two times a day (BID) | ORAL | 0 refills | Status: DC

## 2024-01-26 NOTE — Progress Notes (Signed)
 Heart Failure Navigator Progress Note  Assessed for Heart & Vascular TOC clinic readiness.  Patient does not meet criteria due to current AHF Team patient of Dr Zenaida. Patient sees him at the [facility].  Reached out to that office and they scheduled her post hospitalization appointment for 02/05/24 @ 2:30 PM.  Navigator will sign off at this time.  Charmaine Pines, RN, BSN [facility] Heart Failure Navigator Secure Chat Only

## 2024-01-26 NOTE — Discharge Summary (Signed)
 Physician Discharge Summary   Patient: Lauren Lloyd MRN: [id number] DOB: 08-Nov-1940  Admit date:     01/23/2024  Discharge date: 01/26/24  Discharge Physician: Delon Herald   PCP: Myrla Jon HERO, MD   Recommendations at discharge:   You are being referred to cardiac rehabilitation Take Plavix  daily for 1 year Resume Eliquis  Take metoprolol  twice daily Take nitroglycerin  (dissolve under your tongue) as needed for chest pain Take Lipitor  (atorvastatin ) daily with Zetia  (ezetimibe ) Follow up with cardiology in 2 weeks Follow up with Dr. Bacigalupo in 1-2 weeks  Discharge Diagnoses: Principal Problem:   NSTEMI (non-ST elevated myocardial infarction) [facility]) Active Problems:   Dyslipidemia   Chronic diastolic CHF (congestive heart failure) (HCC)   Paroxysmal atrial fibrillation (HCC)   Peripheral neuropathy   Gout   Chronic obstructive pulmonary disease [facility])   Hospital Course: 83yo with h/o carotid artery disease, chronic HFpEF, HTN, OSA, and PAF who presented on 9/9 with chest pain.  She was found to have an NSTEMI with troponin 799 -> 5728.  She was started on heparin  with cardiology consultation and likely need for cardiac cath.  Cath completed on 9/11, recommendation for medical management.  Assessment and Plan:   NSTEMI (non-ST elevated myocardial infarction)  Acute onset of left-sided CP while at rest, atypical presentation Markedly elevated troponin c/w NSTEMI Admitted to a progressive unit bed Treated with IV heparin , ASA, NTG, and morphine  Started on beta-blocker therapy 2D echo on 9/10 with EF 50-55%, +WMA Cardiology consulted Cardiac cath on 9/11 with small vessel disease, recommended for medical management Will need Plavix  x 12 months (no ASA) Will rx SL NTG for prn use   Dyslipidemia Will continue Zetia  and the patient will be placed on high-dose statin   Chronic diastolic CHF (congestive heart failure)  Will continue furosemide  and Farxiga  Echo  ordered   Paroxysmal atrial fibrillation  Resume Eliquis  No longer on bisoprolol  Continue tafamadis   Gout Continue allopurinol   OSA Continue CPAP   Peripheral neuropathy Continue Neurontin    Class 1 obesity Body mass index is 33.67 kg/m.SABRA  Weight loss should be encouraged Outpatient PCP/bariatric medicine f/u encouraged Significantly low or high BMI is associated with higher medical risk including morbidity and mortality    DNR DNR confirmed at the time of admission Patient will need a gold out of facility DNR form at the time of discharge          Consultants: Cardiology   Procedures: Echocardiogram 9/10 LHC 9/11   Antibiotics: None   Pain control - [place]  Controlled Substance Reporting System database was reviewed. and patient was instructed, not to drive, operate heavy machinery, perform activities at heights, swimming or participation in water  activities or provide baby-sitting services while on Pain, Sleep and Anxiety Medications; until their outpatient Physician has advised to do so again. Also recommended to not to take more than prescribed Pain, Sleep and Anxiety Medications.   Disposition: Home Diet recommendation:  Cardiac diet DISCHARGE MEDICATION: Allergies as of 01/26/2024       Reactions   Levofloxacin Other (See Comments)   Other reaction(s): Joint Pains   Influenza Vaccine Recombinant Other (See Comments)   Influenza Vaccines Other (See Comments)   Bell's Palsy   Oysters [shellfish Allergy] Swelling   She states she had eaten them three days in a row and she developed swelling around her eyes.         Medication List     TAKE these medications    allopurinol   100 MG tablet Commonly known as: ZYLOPRIM  TAKE ONE TABLET BY MOUTH EVERY DAY   atorvastatin  80 MG tablet Commonly known as: LIPITOR  Take 1 tablet (80 mg total) by mouth daily. Start taking on: January 27, 2024   BIOFREEZE EX Apply 1 application. topically daily  as needed (Neck pain).   calcium  carbonate 750 MG chewable tablet Commonly known as: TUMS EX Chew 2 tablets by mouth daily as needed for heartburn.   Cholecalciferol  25 MCG (1000 UT) tablet Take 1,000 Units by mouth daily.   clopidogrel  75 MG tablet Commonly known as: PLAVIX  Take 1 tablet (75 mg total) by mouth daily. Start taking on: January 27, 2024   dapagliflozin  propanediol 10 MG Tabs tablet Commonly known as: Farxiga  Take 1 tablet (10 mg total) by mouth daily before breakfast.   Eliquis  5 MG Tabs tablet Generic drug: apixaban  TAKE ONE TABLET TWICE DAILY   ezetimibe  10 MG tablet Commonly known as: ZETIA  TAKE 1 TABLET BY MOUTH DAILY   furosemide  40 MG tablet Commonly known as: LASIX  TAKE 1 TABLET BY MOUTH DAILY. TAKE AN EXTRA TABLET AS NEEDED AFTER LUNCH FOR ABDOMINAL SWELLING, LEG SWELLING OR SHORTNESS OF BREATH   gabapentin  600 MG tablet Commonly known as: NEURONTIN  Take 1 tablet (600 mg total) by mouth at bedtime.   lactobacillus acidophilus Tabs tablet Take 1-2 tablets by mouth 2 (two) times daily. 2 AM AND 1 HS   Magnesium  300 MG Caps Take 2 capsules by mouth daily.   metaxalone  800 MG tablet Commonly known as: SKELAXIN  Take 1 tablet (800 mg total) by mouth daily as needed for muscle spasms.   metoprolol  tartrate 25 MG tablet Commonly known as: LOPRESSOR  Take 0.5 tablets (12.5 mg total) by mouth 2 (two) times daily.   multivitamin capsule Take 1 capsule by mouth daily.   mupirocin  ointment 2 % Commonly known as: BACTROBAN  Place 1 application  into the nose 2 (two) times daily.   nitroGLYCERIN  0.4 MG SL tablet Commonly known as: NITROSTAT  Place 1 tablet (0.4 mg total) under the tongue every 5 (five) minutes as needed for chest pain.   potassium chloride  10 MEQ tablet Commonly known as: KLOR-CON  TAKE 1 TABLET BY MOUTH DAILY   Stiolto Respimat  2.5-2.5 MCG/ACT Aers Generic drug: Tiotropium Bromide -Olodaterol Inhale 2 puffs into the lungs  daily. What changed:  when to take this reasons to take this   Vyndamax  61 MG Caps Generic drug: Tafamidis  Take 1 capsule (61 mg total) by mouth daily.        Follow-up Information     [facility]. Go on 02/05/2024.   Specialty: Cardiology Why: Hospital Follow-Up 02/05/24 @ 2:30PM  Please bring all medications to follow-up appointment [facility]  Entrance C off of [address] (Gate Code 1420) to park under the Building  or use Free Valet Parking at the door. Contact information: [address] [place]  [phone]               Discharge Exam:  Subjective: Feels fine, eager to go home.  R wrist pressure dressing is in place.   Objective: Vitals:   01/26/24 0312 01/26/24 0830  BP: (!) 131/56 (!) 158/75  Pulse: 70 70  Resp: 18   Temp: 97.7 F (36.5 C) 98.2 F (36.8 C)  SpO2: 93% 92%    Intake/Output Summary (Last 24 hours) at 01/26/2024 1241 Last data filed at 01/26/2024 1040 Gross per 24 hour  Intake 420.63 ml  Output --  Net 420.63 ml   Filed Weights   01/23/24 2003  Weight: 97.5 kg    Exam:  General:  Appears calm and comfortable and is in NAD Eyes:  normal lids, iris ENT:  grossly normal hearing, lips & tongue, mmm Cardiovascular:  RRR. No LE edema.  Respiratory:   CTA bilaterally with no wheezes/rales/rhonchi.  Normal respiratory effort. Abdomen:  soft, NT, ND Skin:  no rash or induration seen on limited exam Musculoskeletal:  grossly normal tone BUE/BLE, good ROM, no bony abnormality Psychiatric:  grossly normal mood and affect, speech fluent and appropriate, AOx3 Neurologic:  CN 2-12 grossly intact, moves all extremities in coordinated fashion   Data Reviewed: I have reviewed the patient's lab results since admission.  Pertinent labs for today include:   None today    Condition at discharge: good  The results of  significant diagnostics from this hospitalization (including imaging, microbiology, ancillary and laboratory) are listed below for reference.   Imaging Studies: CARDIAC CATHETERIZATION Result Date: 01/25/2024 Table formatting from the original result was not included. Images from the original result were not included.   Dist LAD-1 lesion is 75% stenosed.   Dist LAD-2 lesion is 50% stenosed. Dominance: Right  Tortuous coronary arteries but with minimal disease with exception of very distal/apical LAD ~ 75% that is 1.5 mm at most in diameter that has a diffuse stenosis likely the culprit. Normal LVEDP RECOMMENDATIONS   Anticipated discharge date to be determined.   Would continue maximize GDMT for CAD   Recommend uninterrupted dual antiplatelet therapy with Clopidogrel  75mg  daily for a minimum of 12 months (ACS-Class I recommendation).   Can hold for urgent procedures, but will try to get at least 6 months based on ACS presentation-per guidelines would be 12 months.   ECHOCARDIOGRAM COMPLETE Result Date: 01/24/2024    ECHOCARDIOGRAM REPORT   Patient Name:   PUJA CAFFEY Date of Exam: 01/24/2024 Medical Rec #:  [id number]     Height:       67.0 in Accession #:    [id number]    Weight:       215.0 lb Date of Birth:  07-09-1940     BSA:          2.085 m Patient Age:    83 years      BP:           144/71 mmHg Patient Gender: F             HR:           70 bpm. Exam Location:  [facility] Procedure: 2D Echo, Cardiac Doppler and Color Doppler (Both Spectral and Color            Flow Doppler were utilized during procedure). Indications:     NSTEMI I21.4  History:         Patient has prior history of Echocardiogram examinations, most                  recent 09/15/2022. CHF; Risk Factors:Hypertension. Permanent                  atrial fibrillation.  Sonographer:     Christopher Furnace Referring Phys:  [id number] JAN A MANSY Diagnosing Phys: Evalene Lunger MD IMPRESSIONS  1. Left ventricular ejection fraction, by estimation, is 50 to 55%.  The left ventricle has low normal function. The left ventricle demonstrates regional wall motion abnormalities (hypokinesis of the mid to  distal anterior, anteroseptal and apical region). There is moderate left ventricular hypertrophy. Left ventricular diastolic parameters are indeterminate.  2. Right ventricular systolic function is normal. The right ventricular size is normal.  3. Left atrial size was mildly dilated.  4. The mitral valve is normal in structure. Mild mitral valve regurgitation. No evidence of mitral stenosis.  5. The aortic valve is tricuspid. Aortic valve regurgitation is not visualized. No aortic stenosis is present.  6. The inferior vena cava is normal in size with greater than 50% respiratory variability, suggesting right atrial pressure of 3 mmHg. FINDINGS  Left Ventricle: Left ventricular ejection fraction, by estimation, is 50 to 55%. The left ventricle has low normal function. The left ventricle demonstrates regional wall motion abnormalities. Strain was performed and the global longitudinal strain is indeterminate. The left ventricular internal cavity size was normal in size. There is moderate left ventricular hypertrophy. Left ventricular diastolic parameters are indeterminate. Right Ventricle: The right ventricular size is normal. No increase in right ventricular wall thickness. Right ventricular systolic function is normal. Left Atrium: Left atrial size was mildly dilated. Right Atrium: Right atrial size was normal in size. Pericardium: There is no evidence of pericardial effusion. Mitral Valve: The mitral valve is normal in structure. Mild mitral valve regurgitation. No evidence of mitral valve stenosis. Tricuspid Valve: The tricuspid valve is normal in structure. Tricuspid valve regurgitation is not demonstrated. No evidence of tricuspid stenosis. Aortic Valve: The aortic valve is tricuspid. Aortic valve regurgitation is not visualized. No aortic stenosis is present. Aortic valve mean  gradient measures 3.0 mmHg. Aortic valve peak gradient measures 6.0 mmHg. Aortic valve area, by VTI measures 2.50 cm. Pulmonic Valve: The pulmonic valve was normal in structure. Pulmonic valve regurgitation is not visualized. No evidence of pulmonic stenosis. Aorta: The aortic root is normal in size and structure. Venous: The inferior vena cava is normal in size with greater than 50% respiratory variability, suggesting right atrial pressure of 3 mmHg. IAS/Shunts: No atrial level shunt detected by color flow Doppler. Additional Comments: 3D was performed not requiring image post processing on an independent workstation and was indeterminate. A device lead is visualized.  LEFT VENTRICLE PLAX 2D LVIDd:         4.00 cm LVIDs:         2.60 cm LV PW:         1.10 cm LV IVS:        1.50 cm LVOT diam:     2.00 cm LV SV:         62 LV SV Index:   30 LVOT Area:     3.14 cm  RIGHT VENTRICLE RV Basal diam:  3.70 cm RV Mid diam:    2.70 cm RV S prime:     12.60 cm/s TAPSE (M-mode): 1.7 cm LEFT ATRIUM             Index        RIGHT ATRIUM           Index LA diam:        4.40 cm 2.11 cm/m   RA Area:     22.40 cm LA Vol (A2C):   24.1 ml 11.56 ml/m  RA Volume:   62.80 ml  30.11 ml/m LA Vol (A4C):   48.5 ml 23.26 ml/m LA Biplane Vol: 35.9 ml 17.22 ml/m  AORTIC VALVE AV Area (Vmax):    2.20 cm AV Area (Vmean):   2.23 cm AV Area (VTI):  2.50 cm AV Vmax:           122.00 cm/s AV Vmean:          85.300 cm/s AV VTI:            0.249 m AV Peak Grad:      6.0 mmHg AV Mean Grad:      3.0 mmHg LVOT Vmax:         85.40 cm/s LVOT Vmean:        60.500 cm/s LVOT VTI:          0.198 m LVOT/AV VTI ratio: 0.80  AORTA Ao Root diam: 2.80 cm MITRAL VALVE                TRICUSPID VALVE MV Area (PHT): 4.83 cm     TR Peak grad:   32.9 mmHg MV Decel Time: 157 msec     TR Vmax:        287.00 cm/s MV E velocity: 108.00 cm/s                             SHUNTS                             Systemic VTI:  0.20 m                             Systemic  Diam: 2.00 cm Evalene Lunger MD Electronically signed by Evalene Lunger MD Signature Date/Time: 01/24/2024/3:44:51 PM    Final    DG Chest Port 1 View Result Date: 01/23/2024 CLINICAL DATA:  Chest pain. EXAM: PORTABLE CHEST 1 VIEW COMPARISON:  Chest radiograph dated 05/26/2023. FINDINGS: No focal consolidation, pleural effusion or pneumothorax. Diffuse interstitial reticular coarsening, likely chronic. Atypical infection is not excluded. Stable cardiomegaly. No acute osseous pathology. Left pectoral pacemaker device. IMPRESSION: 1. No focal consolidation. 2. Cardiomegaly. Electronically Signed   By: Vanetta Chou M.D.   On: 01/23/2024 20:24   VAS US  CAROTID Result Date: 01/14/2024 Carotid Arterial Duplex Study Patient Name:  JACKALYN HAITH  Date of Exam:   01/12/2024 Medical Rec #: [id number]      Accession #:    [id number] Date of Birth: 1940/08/13      Patient Gender: F Patient Age:   28 years Exam Location:  [facility] Procedure:      VAS US  CAROTID Referring Phys: MIKEY FURTH --------------------------------------------------------------------------------  Indications:       Carotid artery disease and Dizziness. Risk Factors:      Hypertension, hyperlipidemia, past history of smoking. Comparison Study:  Carotid duplex on 05/31/18 showed less than 50% stenosis in                    the right and left internal carotid                    arteries. Performing Technologist: Damien Drones  Examination Guidelines: A complete evaluation includes B-mode imaging, spectral Doppler, color Doppler, and power Doppler as needed of all accessible portions of each vessel. Bilateral testing is considered an integral part of a complete examination. Limited examinations for reoccurring indications may be performed as noted.  Right Carotid Findings: +----------+--------+--------+--------+------------------+---------------------+           PSV cm/sEDV cm/sStenosisPlaque DescriptionComments               +----------+--------+--------+--------+------------------+---------------------+  CCA Prox  74      10                                                      +----------+--------+--------+--------+------------------+---------------------+ CCA Distal80      12                                intimal thickening    +----------+--------+--------+--------+------------------+---------------------+ ICA Prox  111     17      <50%                      Calcified plaque in                                                       the carotid bulb      +----------+--------+--------+--------+------------------+---------------------+ ICA Mid   97      18                                                      +----------+--------+--------+--------+------------------+---------------------+ ICA Distal87      19                                tortuous              +----------+--------+--------+--------+------------------+---------------------+ ECA       122     12                                                      +----------+--------+--------+--------+------------------+---------------------+ +----------+--------+-------+----------------+-------------------+           PSV cm/sEDV cmsDescribe        Arm Pressure (mmHG) +----------+--------+-------+----------------+-------------------+ [id number]            Multiphasic, [id number]                 +----------+--------+-------+----------------+-------------------+ +---------+--------+--+--------+-+---------+ VertebralPSV cm/s35EDV cm/s5Antegrade +---------+--------+--+--------+-+---------+  Left Carotid Findings: +----------+--------+--------+--------+------------------+---------------------+           PSV cm/sEDV cm/sStenosisPlaque DescriptionComments              +----------+--------+--------+--------+------------------+---------------------+ CCA Prox  93      17                                                       +----------+--------+--------+--------+------------------+---------------------+ CCA Distal88      16                                                      +----------+--------+--------+--------+------------------+---------------------+  ICA Prox  47      10              calcific          Calcified plaque in                                                       the carotid bulb      +----------+--------+--------+--------+------------------+---------------------+ ICA Mid   85      21                                                      +----------+--------+--------+--------+------------------+---------------------+ ICA Distal111     26      <50%                      tortuous              +----------+--------+--------+--------+------------------+---------------------+ ECA       148     7                                                       +----------+--------+--------+--------+------------------+---------------------+ +----------+--------+--------+----------------+-------------------+           PSV cm/sEDV cm/sDescribe        Arm Pressure (mmHG) +----------+--------+--------+----------------+-------------------+ [id number]             Multiphasic, [id number]                 +----------+--------+--------+----------------+-------------------+ +---------+--------+--+--------+--+---------+ VertebralPSV cm/s65EDV cm/s14Antegrade +---------+--------+--+--------+--+---------+   Summary: Right Carotid: Velocities in the right ICA are consistent with a < 50% stenosis.                Calcific plaque noted in the carotid bulb. Left Carotid: Velocities in the left ICA are consistent with a <50% stenosis.               Calcific plaque noted in the carotid bulb and proximal ICA. Vertebrals:  Bilateral vertebral arteries demonstrate antegrade flow. Subclavians: Normal flow hemodynamics were seen in bilateral subclavian              arteries. *See table(s) above for  measurements and observations. Suggest follow up study in 12 months. Electronically signed by Maude Emmer MD on 01/14/2024 at 9:03:53 PM.    Final     Microbiology: Results for orders placed or performed in visit on 03/20/23  Urine Culture     Status: None   Collection Time: 03/20/23 12:00 AM   Specimen: Urine   Urine  Result Value Ref Range Status   Urine Culture, Routine Final report  Final   Organism ID, Bacteria No growth  Final    Labs: CBC: Recent Labs  Lab 01/23/24 2005 01/24/24 0008 01/25/24 0401  WBC 9.0 9.2 10.0  HGB 15.1* 13.7 14.2  HCT 45.7 41.2 42.8  MCV 94.2 92.8 94.3  PLT 247 239 217   Basic Metabolic Panel: Recent Labs  Lab 01/23/24 2005 01/24/24 0008 01/25/24 0401  NA 140  141 141  K 3.2* 3.6 4.0  CL 102 102 107  CO2 27 27 24   GLUCOSE 139* 109* 97  BUN 25* 22 21  CREATININE 1.00 0.90 0.84  CALCIUM  9.4 9.5 8.9   Liver Function Tests: No results for input(s): AST, ALT, ALKPHOS, BILITOT, PROT, ALBUMIN in the last 168 hours. CBG: No results for input(s): GLUCAP in the last 168 hours.  Discharge time spent: greater than 30 minutes.  Signed: Delon Herald, MD [facility] 01/26/2024

## 2024-01-26 NOTE — Plan of Care (Signed)

## 2024-01-26 NOTE — Progress Notes (Signed)
 Cardiology Progress Note   Patient Name: Lauren Lloyd Date of Encounter: 01/26/2024  Primary Cardiologist: Timothy Gollan, MD  Subjective   Feels well this AM.  No c/p or sob.  Eager to go home. Objective   Inpatient Medications    Scheduled Meds:  acidophilus  1 capsule Oral TID   allopurinol   100 mg Oral Daily   aspirin  EC  81 mg Oral Daily   atorvastatin   80 mg Oral Daily   cholecalciferol   1,000 Units Oral Daily   clopidogrel   75 mg Oral Daily   dapagliflozin  propanediol  10 mg Oral QAC breakfast   ezetimibe   10 mg Oral Daily   free water   500 mL Oral Once   furosemide   40 mg Oral Daily   gabapentin   600 mg Oral QHS   metoprolol  tartrate  12.5 mg Oral BID   multivitamin with minerals  1 tablet Oral Daily   potassium chloride   10 mEq Oral Daily   sodium chloride  flush  3 mL Intravenous Q12H   Tafamidis   61 mg Oral Daily   Continuous Infusions:  sodium chloride  10 mL/hr at 01/25/24 1544   sodium chloride      PRN Meds: sodium chloride , acetaminophen , ALPRAZolam , calcium  carbonate, magnesium  hydroxide, metaxalone , nitroGLYCERIN , ondansetron  (ZOFRAN ) IV, sodium chloride  flush, traZODone    Vital Signs    Vitals:   01/25/24 2059 01/25/24 2322 01/26/24 0312 01/26/24 0830  BP: (!) 153/67 (!) 142/77 (!) 131/56 (!) 158/75  Pulse: 71 76 70 70  Resp: 18 18 18    Temp: 98.4 F (36.9 C) 98.2 F (36.8 C) 97.7 F (36.5 C) 98.2 F (36.8 C)  TempSrc:      SpO2: 97% 97% 93% 92%  Weight:      Height:        Intake/Output Summary (Last 24 hours) at 01/26/2024 1207 Last data filed at 01/26/2024 1040 Gross per 24 hour  Intake 420.63 ml  Output --  Net 420.63 ml   Filed Weights   01/23/24 2003  Weight: 97.5 kg    Physical Exam   GEN: Well nourished, well developed, in no acute distress.  HEENT: Grossly normal.  Neck: Supple, no JVD, carotid bruits, or masses. Cardiac: RRR, no murmurs, rubs, or gallops. No clubbing, cyanosis, edema.  Radials 2+, DP/PT 2+ and equal  bilaterally. R radial cath site w/o bleeding/bruit/hematoma. Respiratory:  Respirations regular and unlabored, clear to auscultation bilaterally. GI: Soft, nontender, nondistended, BS + x 4. MS: no deformity or atrophy. Skin: warm and dry, no rash. Neuro:  Strength and sensation are intact. Psych: AAOx3.  Normal affect.  Labs    Chemistry Recent Labs  Lab 01/23/24 2005 01/24/24 0008 01/25/24 0401  NA 140 141 141  K 3.2* 3.6 4.0  CL 102 102 107  CO2 27 27 24   GLUCOSE 139* 109* 97  BUN 25* 22 21  CREATININE 1.00 0.90 0.84  CALCIUM  9.4 9.5 8.9  GFRNONAA 56* >60 >60  ANIONGAP 11 12 10      Hematology Recent Labs  Lab 01/23/24 2005 01/24/24 0008 01/25/24 0401  WBC 9.0 9.2 10.0  RBC 4.85 4.44 4.54  HGB 15.1* 13.7 14.2  HCT 45.7 41.2 42.8  MCV 94.2 92.8 94.3  MCH 31.1 30.9 31.3  MCHC 33.0 33.3 33.2  RDW 13.7 13.7 13.9  PLT 247 239 217    Cardiac Enzymes  Recent Labs  Lab 01/23/24 2005 01/23/24 2153  TROPONINIHS 799* 5,728*      BNP  Component Value Date/Time   BNP 746.2 (H) 05/12/2022 0453    ProBNP    Component Value Date/Time   PROBNP 2,017 (H) 08/04/2022 1107    Lipids  Lab Results  Component Value Date   CHOL 189 01/24/2024   HDL 60 01/24/2024   LDLCALC 119 (H) 01/24/2024   TRIG 49 01/24/2024   CHOLHDL 3.2 01/24/2024    HbA1c  Lab Results  Component Value Date   HGBA1C 5.5 01/11/2024    Radiology    DG Chest Port 1 View Result Date: 01/23/2024 CLINICAL DATA:  Chest pain. EXAM: PORTABLE CHEST 1 VIEW COMPARISON:  Chest radiograph dated 05/26/2023. FINDINGS: No focal consolidation, pleural effusion or pneumothorax. Diffuse interstitial reticular coarsening, likely chronic. Atypical infection is not excluded. Stable cardiomegaly. No acute osseous pathology. Left pectoral pacemaker device. IMPRESSION: 1. No focal consolidation. 2. Cardiomegaly. Electronically Signed   By: Vanetta Chou M.D.   On: 01/23/2024 20:24     Telemetry    V  paced, underlying afib - Personally Reviewed  Cardiac Studies   2D Echocardiogram 9.10.2025    1. Left ventricular ejection fraction, by estimation, is 50 to 55%. The  left ventricle has low normal function. The left ventricle demonstrates  regional wall motion abnormalities (hypokinesis of the mid to distal  anterior, anteroseptal and apical  region). There is moderate left ventricular hypertrophy. Left ventricular  diastolic parameters are indeterminate.   2. Right ventricular systolic function is normal. The right ventricular  size is normal.   3. Left atrial size was mildly dilated.   4. The mitral valve is normal in structure. Mild mitral valve  regurgitation. No evidence of mitral stenosis.   5. The aortic valve is tricuspid. Aortic valve regurgitation is not  visualized. No aortic stenosis is present.   6. The inferior vena cava is normal in size with greater than 50%  respiratory variability, suggesting right atrial pressure of 3 mmHg.  _____________   Cardiac Catheterization  9.11.2025    Dist LAD-1 lesion is 75% stenosed.   Dist LAD-2 lesion is 50% stenosed.   Dominance: Right    Tortuous coronary arteries but with minimal disease with exception of very distal/apical LAD ~ 75% that is 1.5 mm at most in diameter that has a diffuse stenosis likely the culprit. Normal LVEDP      RECOMMENDATIONS   Anticipated discharge date to be determined.   Would continue maximize GDMT for CAD   Recommend uninterrupted dual antiplatelet therapy with Clopidogrel  75mg  daily for a minimum of 12 months (ACS-Class I recommendation).   Can hold for urgent procedures, but will try to get at least 6 months based on ACS presentation-per guidelines would be 12 months. _____________   Patient Profile     83 y.o. female w/a  h/o permanent atrial fibrillation status post AV nodal ablation and biventricular pacemaker, chronic HFpEF/MGUS on tafamidis , hypertension, pulmonary hypertension, sleep  apnea, and COPD, who was admitted September 9 with chest pain and non-STEMI.  Echo September 10 with an EF of 50-55% with hypokinesis of the mid to distal anterior, anteroseptal, and apical regions.  Assessment & Plan    1.  NSTEMI/CAD:  Admitted w/ sudden onset of c/p. hsTrop up to 5728 on 9/9.  Echo 9/10 w/ EF 50-55% and new hypokinesis of the mid to distal anterior, anteroseptal and apical region. S/p cath 9/11  w/ evidence of small vessel (1.22mm) apical LAD dzs, w/ rec for med rx.  No c/p or dyspnea.  R  wrist looks good.  Cont, statin, plavix  (12 mos), ? blocker.  No asa in setting of chronic eliqus.  Rec sl ntg at discharge.  Will f/u in 2 wks.  Pending symptoms, may need long acting nitrate.   2.  Permanent Afib/Tachybrady syndrome:  S/p AVN ablation and BiV PPM.  V paced on tele w/ 5 beats NSVT.  Resume eliquis .  D/c aspirin .  3.  Chronic HFpEF/MGUS:  EF 50-55% by echo this admission.  Prior PYP scan +.  Followed in AHF clinic - on tafamadis.  Output not recorded for entire admission.  No edema on examination and appears to be euvolemic.  Heart rate and blood pressure stable.  Continue tafamidis , beta-blocker, Lasix , and SGLT2 inhibitor.   4.  COPD/Emphysema: Lungs are clear.   5.  Pulmonary HTN:  RVSP 37.9 mmHg on echo in 09/2022.  Nl RV fxn on echo 9/10.  See #3.   6.  OSA: Uses CPAP at home.    7.  Moderate MR: Noted on echo.  I do not appreciate a murmur on examination.  Will require outpatient serial follow-up.   8.  Hyperlipidemia: LDL 119.  Continue high potency statin (new) and Zetia  therapy.   Signed, Lonni Meager, NP  01/26/2024, 12:07 PM    For questions or updates, please contact   Please consult [email or web address] for contact info under Cardiology/STEMI.

## 2024-01-29 ENCOUNTER — Telehealth: Payer: Self-pay

## 2024-01-29 DIAGNOSIS — D6869 Other thrombophilia: Secondary | ICD-10-CM | POA: Diagnosis not present

## 2024-01-29 DIAGNOSIS — Z9849 Cataract extraction status, unspecified eye: Secondary | ICD-10-CM | POA: Diagnosis not present

## 2024-01-29 DIAGNOSIS — G4730 Sleep apnea, unspecified: Secondary | ICD-10-CM | POA: Diagnosis not present

## 2024-01-29 LAB — CUP PACEART REMOTE DEVICE CHECK
Battery Remaining Longevity: 144 mo
Battery Remaining Percentage: 100 %
Brady Statistic RA Percent Paced: 0 %
Brady Statistic RV Percent Paced: 96 %
Date Time Interrogation Session: 20250912184900
Implantable Lead Connection Status: 753985
Implantable Lead Connection Status: 753985
Implantable Lead Implant Date: 20230612
Implantable Lead Implant Date: 20230612
Implantable Lead Location: 753859
Implantable Lead Location: 753860
Implantable Lead Model: 7841
Implantable Lead Model: 7842
Implantable Lead Serial Number: 1182863
Implantable Lead Serial Number: 1274488
Implantable Pulse Generator Implant Date: 20230612
Lead Channel Impedance Value: 543 Ohm
Lead Channel Impedance Value: 678 Ohm
Lead Channel Pacing Threshold Amplitude: 0.8 V
Lead Channel Pacing Threshold Pulse Width: 0.4 ms
Lead Channel Setting Pacing Amplitude: 1.2 V
Lead Channel Setting Pacing Pulse Width: 0.4 ms
Lead Channel Setting Sensing Sensitivity: 3.5 mV
Pulse Gen Serial Number: 111954
Zone Setting Status: 755011

## 2024-01-29 NOTE — Transitions of Care (Post Inpatient/ED Visit) (Signed)
   01/29/2024  Name: Lauren Lloyd MRN: [id number] DOB: 02-02-1941  Today's TOC FU Call Status: Today's TOC FU Call Status:: Successful TOC FU Call Completed TOC FU Call Complete Date: 01/29/24 Patient's Name and Date of Birth confirmed.  Transition Care Management Follow-up Telephone Call Date of Discharge: 01/26/24 Discharge Facility: [facility]) Type of Discharge: Inpatient Admission Primary Inpatient Discharge Diagnosis:: NSTEMI How have you been since you were released from the hospital?: Better Any questions or concerns?: Yes Patient Questions/Concerns:: Can I drive, they only told me that I wasn't to use my wrist after my catheterization for 3 days and I live alone and need to get out and about to appointments and all? Patient Questions/Concerns Addressed: Other: (Patient and I seached AVS and no instructions regarding not driving, patient had to end conversation as she requested a call back she has had a visitor.)  Items Reviewed: Medications obtained,verified, and reconciled?: Partial Review Completed (Request a call back) Reason for Partial Mediation Review: Couldn't talk had a visitor coming in the house Any new allergies since your discharge?: No Do you have support at home?: Yes People in Home [RPT]: alone Name of Support/Comfort Primary Source: son  Medications Reviewed Today: Patient declined to review medications states they added two medicines and my pharmacist was concerned for Plavix  and Eliquis   being together. Noted on AVS with patient . Attempted to review all medications and patient states they are the same as on the sheet and it's 18 of them and I don't think it's necessary to go through them as I will see the PA Friday and can ask then. Medications Reviewed Today   Medications were not reviewed in this encounter     Home Care and Equipment/Supplies: Were Home Health Services Ordered?: No   1527 Called patient back and offered  again follow up appointment with PCP and she states I don't feel it necessary because all they're going to do it let me see the PA and not the doctor and I'm already seeing a Cardiology, PA on this Friday. Patient was offered 30 day TOC program for weekly calls. Patient states I don't like looking in MyChart, I just don't like it, they have it. Thanked patient for follow up call and she thanked Conservation officer, nature for calling back but she will just follow up with PA on Friday. Will sign off.    Richerd Fish, RN, BSN, CCM [facility], [facility] RN Care Manager Direct Dial: [phone]

## 2024-01-30 ENCOUNTER — Telehealth: Payer: Self-pay

## 2024-01-30 NOTE — Telephone Encounter (Signed)
 Patient has OV in 02/01/24   Copied from CRM #[id number]. Topic: Clinical - Request for Lab/Test Order >> Jan 30, 2024 10:43 AM Ivette P wrote: Reason for CRM: pt just recently left the ER and was told ot request a bone density test.    Pls follow up with pt to schedule once order is in

## 2024-01-30 NOTE — Telephone Encounter (Signed)
 Will discuss at visit - please leave note in comment section of appt to remind us 

## 2024-02-01 ENCOUNTER — Inpatient Hospital Stay: Admitting: Family Medicine

## 2024-02-01 ENCOUNTER — Ambulatory Visit: Payer: Self-pay | Admitting: Cardiology

## 2024-02-02 ENCOUNTER — Ambulatory Visit: Attending: Medical | Admitting: Medical

## 2024-02-02 NOTE — Progress Notes (Deleted)
 Cardiology Office Note   Date:  02/02/2024  ID:  SIVAN CUELLO, DOB [birth year], MRN [id number] PCP: Myrla Jon HERO, MD  [facility] Providers Cardiologist:  Evalene Lunger, MD Electrophysiologist:  OLE ONEIDA HOLTS, MD { Click to update primary MD,subspecialty MD or APP then REFRESH:1}    History of Present Illness Lauren Lloyd is a 83 y.o. female with a history of HFpEF, permanent A-fib s/p AV nodal ablation, hypertension, OSA, aortic atherosclerosis, pulmonary hypertension, and COPD who is being seen for hospital follow-up.  The patient was diagnosed with A-fib in 2019 and she was placed on oral anticoagulation and underwent cardioversion in January 2020.  She had recurrent A-fib in November/2020 and was placed on amiodarone  therapy.  She converted to sinus rhythm but then stopped amiodarone  by early December due to concerns about dyspnea.  Amiodarone  was subsequently resumed and she was noted to be back in A-fib in January 2021.  Following additional amiodarone  loading, she converted back to normal rhythm 08-2019.  Heart monitor October 2021 showed no evidence of A-fib.  She did have 20 brief episodes of SVT, though none of these were triggered events.  She had a knee surgery in July 2022 and noted tachypalpitations consistent with recurrent A-fib.  Amiodarone  and bisoprolol  were adjusted.  She was seen in follow-up September 2022 and noted to be in A-fib.  She underwent cardioversion in October 2022.  She was seen again in follow-up October 2022 and noted to be back in A-fib.  She was referred to EP at that time and saw Dr. HOLTS in November 2022 during which amiodarone  was discontinued due to ineffectiveness.  She was admitted in June 2023 for initiation of Tikosyn  which failed due to nonsustained polymorphic VT.  During hospitalization she converted from A-fib to sinus rhythm with a 6-second posttermination pause and sinus bradycardia with rates in the 50s.  [employer]  dual-chamber PPM implanted 10/25/2021 for tachybradycardia syndrome.  She underwent AV nodal ablation February 2024.  Patient was evaluated by advanced heart failure team January 2025 and AL workup was remarkable for MGUS but normal K/L ratio.  PYP scan positive for G2 uptake uptake in 8/CL 1.4.  She was started on Tafamidis  and genetic testing was obtained.  She noted to have chest pain which was felt to be due to amyloid.  Patient was admitted January 23, 2024 for chest pain.  High-sensitivity troponin went up to 5000.  EKG showed permanent A-fib without ischemic changes.  Potassium was 3.2.  Blood pressure was elevated.  Echo showed EF 50 to 55%, new hypokinesis of the mid to distal anterior, anteroseptal and apical region.  Heart cath showed evidence of small vessel apical LAD disease with recommendations for medical management.  Patient was started on Plavix  (continue for 12 months).  Aspirin  was held for Eliquis .  ROS: ***  Studies Reviewed      *** Risk Assessment/Calculations {Does this patient have ATRIAL FIBRILLATION?:[phone]} No BP recorded.  {Refresh Note OR Click here to enter BP  :1}***       Physical Exam VS:  There were no vitals taken for this visit.       Wt Readings from Last 3 Encounters:  01/23/24 215 lb (97.5 kg)  01/22/24 215 lb (97.5 kg)  01/11/24 213 lb 12.8 oz (97 kg)    GEN: Well nourished, well developed in no acute distress NECK: No JVD; No carotid bruits CARDIAC: ***RRR, no murmurs, rubs, gallops RESPIRATORY:  Clear to auscultation without  rales, wheezing or rhonchi  ABDOMEN: Soft, non-tender, non-distended EXTREMITIES:  No edema; No deformity   ASSESSMENT AND PLAN *** {The patient has an active order for outpatient cardiac rehabilitation.   Please indicate if the patient is ready to start. Do NOT delete this.  It will auto delete.  Refresh note, then sign.              Click here to document readiness and see contraindications.  :1}  Cardiac  Rehabilitation Eligibility Assessment      {Are you ordering a CV Procedure (e.g. stress test, cath, DCCV, TEE, etc)?   Press F2        :[id number]}  Dispo: ***  Signed, Abrahm Mancia VEAR Fishman, PA-C

## 2024-02-02 NOTE — Progress Notes (Signed)
 Remote PPM Transmission

## 2024-02-05 ENCOUNTER — Encounter (HOSPITAL_COMMUNITY): Payer: Self-pay

## 2024-02-05 ENCOUNTER — Encounter: Payer: Self-pay | Admitting: Family Medicine

## 2024-02-05 ENCOUNTER — Ambulatory Visit: Admitting: Family Medicine

## 2024-02-05 ENCOUNTER — Ambulatory Visit (HOSPITAL_COMMUNITY): Payer: Self-pay | Admitting: Adult Health

## 2024-02-05 ENCOUNTER — Ambulatory Visit (HOSPITAL_COMMUNITY)
Admission: RE | Admit: 2024-02-05 | Discharge: 2024-02-05 | Disposition: A | Discharge status: Home / Self Care | Source: Ambulatory Visit | Attending: Adult Health | Admitting: Adult Health

## 2024-02-05 VITALS — BP 139/65 | HR 70 | Ht 67.0 in

## 2024-02-05 VITALS — BP 128/82 | HR 71 | Ht 65.5 in | Wt 214.0 lb

## 2024-02-05 DIAGNOSIS — G4733 Obstructive sleep apnea (adult) (pediatric): Secondary | ICD-10-CM | POA: Insufficient documentation

## 2024-02-05 DIAGNOSIS — Z7902 Long term (current) use of antithrombotics/antiplatelets: Secondary | ICD-10-CM | POA: Diagnosis not present

## 2024-02-05 DIAGNOSIS — I110 Hypertensive heart disease with heart failure: Secondary | ICD-10-CM | POA: Diagnosis not present

## 2024-02-05 DIAGNOSIS — I6789 Other cerebrovascular disease: Secondary | ICD-10-CM | POA: Diagnosis not present

## 2024-02-05 DIAGNOSIS — I447 Left bundle-branch block, unspecified: Secondary | ICD-10-CM | POA: Insufficient documentation

## 2024-02-05 DIAGNOSIS — I4821 Permanent atrial fibrillation: Secondary | ICD-10-CM | POA: Diagnosis not present

## 2024-02-05 DIAGNOSIS — I214 Non-ST elevation (NSTEMI) myocardial infarction: Secondary | ICD-10-CM

## 2024-02-05 DIAGNOSIS — E7800 Pure hypercholesterolemia, unspecified: Secondary | ICD-10-CM

## 2024-02-05 DIAGNOSIS — D472 Monoclonal gammopathy: Secondary | ICD-10-CM | POA: Insufficient documentation

## 2024-02-05 DIAGNOSIS — Z79899 Other long term (current) drug therapy: Secondary | ICD-10-CM | POA: Insufficient documentation

## 2024-02-05 DIAGNOSIS — J449 Chronic obstructive pulmonary disease, unspecified: Secondary | ICD-10-CM | POA: Insufficient documentation

## 2024-02-05 DIAGNOSIS — Z7901 Long term (current) use of anticoagulants: Secondary | ICD-10-CM | POA: Insufficient documentation

## 2024-02-05 DIAGNOSIS — I5032 Chronic diastolic (congestive) heart failure: Secondary | ICD-10-CM | POA: Insufficient documentation

## 2024-02-05 DIAGNOSIS — G8929 Other chronic pain: Secondary | ICD-10-CM

## 2024-02-05 DIAGNOSIS — I480 Paroxysmal atrial fibrillation: Secondary | ICD-10-CM

## 2024-02-05 DIAGNOSIS — I2510 Atherosclerotic heart disease of native coronary artery without angina pectoris: Secondary | ICD-10-CM | POA: Diagnosis not present

## 2024-02-05 DIAGNOSIS — I779 Disorder of arteries and arterioles, unspecified: Secondary | ICD-10-CM

## 2024-02-05 DIAGNOSIS — E669 Obesity, unspecified: Secondary | ICD-10-CM | POA: Diagnosis not present

## 2024-02-05 DIAGNOSIS — M25511 Pain in right shoulder: Secondary | ICD-10-CM | POA: Diagnosis not present

## 2024-02-05 LAB — CBC
HCT: 44.9 % (ref 36.0–46.0)
Hemoglobin: 15.1 g/dL — ABNORMAL HIGH (ref 12.0–15.0)
MCH: 31.6 pg (ref 26.0–34.0)
MCHC: 33.6 g/dL (ref 30.0–36.0)
MCV: 93.9 fL (ref 80.0–100.0)
Platelets: 288 K/uL (ref 150–400)
RBC: 4.78 MIL/uL (ref 3.87–5.11)
RDW: 13.4 % (ref 11.5–15.5)
WBC: 8.1 K/uL (ref 4.0–10.5)
nRBC: 0 % (ref 0.0–0.2)

## 2024-02-05 LAB — BASIC METABOLIC PANEL WITH GFR
Anion gap: 12 (ref 5–15)
BUN: 25 mg/dL — ABNORMAL HIGH (ref 8–23)
CO2: 24 mmol/L (ref 22–32)
Calcium: 8.7 mg/dL — ABNORMAL LOW (ref 8.9–10.3)
Chloride: 103 mmol/L (ref 98–111)
Creatinine, Ser: 1.08 mg/dL — ABNORMAL HIGH (ref 0.44–1.00)
GFR, Estimated: 51 mL/min — ABNORMAL LOW (ref >60)
Glucose, Bld: 89 mg/dL (ref 70–99)
Potassium: 3.4 mmol/L — ABNORMAL LOW (ref 3.5–5.1)
Sodium: 139 mmol/L (ref 135–145)

## 2024-02-05 NOTE — Patient Instructions (Signed)
 Medication Changes:  None, continue current medications  Lab Work:  Labs done today, your results will be available in MyChart, we will contact you for abnormal readings.  Special Instructions // Education:  Do the following things EVERYDAY: Weigh yourself in the morning before breakfast. Write it down and keep it in a log. Take your medicines as prescribed Eat low salt foods--Limit salt (sodium) to 2000 mg per day.  Stay as active as you can everyday Limit all fluids for the day to less than 2 liters   Follow-Up in: 3 months   At the [facility], you and your health needs are our priority. We have a designated team specialized in the treatment of Heart Failure. This Care Team includes your primary Heart Failure Specialized Cardiologist (physician), Advanced Practice Providers (APPs- Physician Assistants and Nurse Practitioners), and Pharmacist who all work together to provide you with the care you need, when you need it.   You may see any of the following providers on your designated Care Team at your next follow up:  Dr. Toribio Fuel Dr. Ezra Shuck Dr. Ria Commander Dr. Odis Brownie Greig Mosses, NP Caffie Shed, [place] [facility] [place], [place] Beckey Coe, NP [place] Lee, NP Tinnie Redman, PharmD   Please be sure to bring in all your medications bottles to every appointment.   Need to Contact Us :  If you have any questions or concerns before your next appointment please send us  a message through [facility] or call our office at [phone].    TO LEAVE A MESSAGE FOR THE NURSE SELECT OPTION 2, PLEASE LEAVE A MESSAGE INCLUDING: YOUR NAME DATE OF BIRTH CALL BACK NUMBER REASON FOR CALL**this is important as we prioritize the call backs  YOU WILL RECEIVE A CALL BACK THE SAME DAY AS LONG AS YOU CALL BEFORE 4:00 PM

## 2024-02-05 NOTE — Assessment & Plan Note (Signed)
 On apixaban  for stroke prevention due to paroxysmal atrial fibrillation. Discussion on the balance of anticoagulation with Plavix  for heart attack prevention. No current fall risk noted. Pharmacist raised concerns about dual anticoagulation, but it is managed for different indications. - Continue apixaban  for stroke prevention - Avoid aspirin  due to dual anticoagulation with Plavix 

## 2024-02-05 NOTE — Progress Notes (Addendum)
   ADVANCED HEART FAILURE CLINIC NOTE  Referring Physician: Myrla Jon HERO, MD  Primary Care: Myrla Jon HERO, MD Primary Cardiologist: Dr. Perla HF MD: Dr Zenaida  HPI: Lauren Lloyd is a 83 y.o. female with a PMH of HFpEF, permanent atrial fibrillation s/p AV nodal ablation, HTN, OSA, COPD.   Last echo 05/24: EF 60-65%, RV okay, RVSP 38 mmHg, moderate TR  She was seen in the ED 05/26/23 for abnormal labs at PCP visit. Had noted orthostatic dizziness and dyspnea with exertion. HS troponin mildly elevated at 16 and BUN also elevated. She was hypertensive but vitals otherwise stable.  Troponin trend and ECG not c/w ACS. CXR with some evidence of CHF. She was given an extra dose of PO lasix . Felt to be stable for discharge home and was referred to Cardiology and Advanced Heart Failure clinic.  PYP + Grade II Uptake. Tafamidis  ordered.   Admitted 01/23/2024 with NSTEMI. HS Trop elevated. Echo EF 50-55% + WMA. Cath with small vessel disease. Medical management pursued. Plan plavix  x 12 months. DNR. Referred to cardiac rehab.    SUBJECTIVE: PMH, current medications, allergies, social history, and family history reviewed in epic.  Today she returns for post hospital follow up. Overall feeling fine. Denies SOB/PND/Orthopnea. No chest pain. Using CPAP evert night. No bleeding issues. Appetite ok. No fever or chills. Taking all medications.   PHYSICAL EXAM: Vitals:   02/05/24 1411  BP: 128/82  Pulse: 71  SpO2: 96%   Wt Readings from Last 3 Encounters:  02/05/24 97.1 kg (214 lb)  01/23/24 97.5 kg (215 lb)  01/22/24 97.5 kg (215 lb)    General:   No resp difficulty Neck: no JVD.  Cor: Irregular rate & rhythm.  Lungs: clear Abdomen: soft, nontender, nondistended.  Extremities: no  edema Neuro: alert & oriented x3  DATA REVIEW ECG: V paced rhythm    ECHO: 09/2022: LVEF 60-65%, LVH, mildly elevated RV pressures  CATH 01/25/24  Tortuous coronary arteries but with minimal  disease with exception of very distal/apical LAD ~ 75% that is 1.5 mm at most in diameter that has a diffuse stenosis likely the culprit. Normal LVEDP    RECOMMENDATIONS    Would continue maximize GDMT for CAD   Recommend uninterrupted dual antiplatelet therapy with Clopidogrel  75mg  daily for a minimum of 12 months (ACS-Class I recommendation).    ASSESSMENT & PLAN:  Chronic diastolic CHF: AL workup remarkable for MGUS but normal K/L ratio. PYP scan positive with grade II uptake and H/Cl 1.4, given her symptoms will start on therapy and obtain genetic testing.  -Appears euvolemic. Continue lasix  40mg  daily - Continue farxiga  10 mg daily - Contnue tafamadis  Permanent atrial fibrillation: S/p BiV pacer, prior LBBB. Does not appear to have worsened exercise capacity, pacer working well. - EP follow up -Continue eliquis  5 mg twice a day.   HTN: Better controlled at home. - Management as above  Obesity: Complicates care.  MGUS: Hematology following.   CAD:  LHC 01/2024- small vessel disease Continue plavix  for 12 months then can drop to eliquis  only.  Continue atrovastatin 40 mg daily. Watch for myalgias. Several years ago she developed myalgias with statin.  Referral to [facility] has been made.   Check CBC and BMET   Follow up with Dr Zenaida in 3 months.   Greig Mosses NP-C  2:38 PM  02/05/24

## 2024-02-05 NOTE — Progress Notes (Signed)
 Established patient visit   Patient: Lauren Lloyd   DOB: [birth year]   [name]  MRN: [id number] Visit Date: 02/05/2024  Today's healthcare provider: Jon Eva, MD   Chief Complaint  Patient presents with   Hospitalization Follow-up    Patient was admitted to [facility] on 01/23/24 and discharged on 01/26/24. She was treated for chest pain and found to have an NSTEMI. Treatment for this included started on heparin  with cardiology consultation. Cath completed on 9/11. She was advised to take plavix  X 1 yr. Resume metoprolol  BID, Take nitroglycerin  prn for chest pain, take lipitor  daily with zetia , f/u w/ cardio X 2 wks, and was referred to cardiac rehab. She reports excellent compliance with treatment.    Subjective    HPI HPI     Hospitalization Follow-up    Additional comments: Patient was admitted to [facility] on 01/23/24 and discharged on 01/26/24. She was treated for chest pain and found to have an NSTEMI. Treatment for this included started on heparin  with cardiology consultation. Cath completed on 9/11. She was advised to take plavix  X 1 yr. Resume metoprolol  BID, Take nitroglycerin  prn for chest pain, take lipitor  daily with zetia , f/u w/ cardio X 2 wks, and was referred to cardiac rehab. She reports excellent compliance with treatment.       Last edited by Lilian Fitzpatrick, CMA on 02/05/2024 10:07 AM.       Discussed the use of AI scribe software for clinical note transcription with the patient, who gave verbal consent to proceed.  History of Present Illness   Lauren Lloyd is an 83 year old female with coronary artery disease and atrial fibrillation who presents for follow-up after a recent myocardial infarction.  On January 23, 2024, she experienced acute left-sided chest pain at rest, leading to an emergency room visit. Elevated troponins were noted without ST-elevation on EKG. Treatment included morphine , nitroglycerin , aspirin , heparin , and a beta blocker. An  echocardiogram showed a normal ejection fraction, and cardiac catheterization on January 25, 2024, revealed small vessel disease. She was discharged on January 26, 2024, with a plan for medical management.  Current medications include clopidogrel  for 12 months, nitroglycerin  as needed, atorvastatin , metoprolol , and apixaban  for atrial fibrillation. She expresses concern about potential myalgias from statins, having experienced them previously.  She has diastolic heart failure and atrial fibrillation, managed by cardiology, and has a pacemaker with electrophysiology follow-up. She underwent a colonoscopy on January 22, 2024, and experienced fatigue and indigestion-like symptoms the following day, initially attributing them to the procedure. Severe chest pain developed later, prompting emergency care.  She is active with no significant fall risk and has a family history of high cholesterol.       TOC call 9/15 - reviewed  Medications: Outpatient Medications Prior to Visit  Medication Sig   allopurinol  (ZYLOPRIM ) 100 MG tablet TAKE ONE TABLET BY MOUTH EVERY DAY   atorvastatin  (LIPITOR ) 80 MG tablet Take 1 tablet (80 mg total) by mouth daily.   calcium  carbonate (TUMS EX) 750 MG chewable tablet Chew 2 tablets by mouth daily as needed for heartburn.   Cholecalciferol  25 MCG (1000 UT) tablet Take 1,000 Units by mouth daily.   clopidogrel  (PLAVIX ) 75 MG tablet Take 1 tablet (75 mg total) by mouth daily.   dapagliflozin  propanediol (FARXIGA ) 10 MG TABS tablet Take 1 tablet (10 mg total) by mouth daily before breakfast.   ELIQUIS  5 MG TABS tablet TAKE ONE TABLET TWICE DAILY  ezetimibe  (ZETIA ) 10 MG tablet TAKE 1 TABLET BY MOUTH DAILY   furosemide  (LASIX ) 40 MG tablet TAKE 1 TABLET BY MOUTH DAILY. TAKE AN EXTRA TABLET AS NEEDED AFTER LUNCH FOR ABDOMINAL SWELLING, LEG SWELLING OR SHORTNESS OF BREATH   gabapentin  (NEURONTIN ) 600 MG tablet Take 1 tablet (600 mg total) by mouth at bedtime.    lactobacillus acidophilus (BACID) TABS tablet Take 1-2 tablets by mouth 2 (two) times daily. 2 AM AND 1 HS   Magnesium  300 MG CAPS Take 2 capsules by mouth daily.   Menthol, Topical Analgesic, (BIOFREEZE EX) Apply 1 application. topically daily as needed (Neck pain).   metaxalone  (SKELAXIN ) 800 MG tablet Take 1 tablet (800 mg total) by mouth daily as needed for muscle spasms.   metoprolol  tartrate (LOPRESSOR ) 25 MG tablet Take 0.5 tablets (12.5 mg total) by mouth 2 (two) times daily.   Multiple Vitamin (MULTIVITAMIN) capsule Take 1 capsule by mouth daily.   mupirocin  ointment (BACTROBAN ) 2 % Place 1 application  into the nose 2 (two) times daily.   nitroGLYCERIN  (NITROSTAT ) 0.4 MG SL tablet Place 1 tablet (0.4 mg total) under the tongue every 5 (five) minutes as needed for chest pain.   potassium chloride  (KLOR-CON ) 10 MEQ tablet TAKE 1 TABLET BY MOUTH DAILY   Tafamidis  (VYNDAMAX ) 61 MG CAPS Take 1 capsule (61 mg total) by mouth daily.   Tiotropium Bromide -Olodaterol (STIOLTO RESPIMAT ) 2.5-2.5 MCG/ACT AERS Inhale 2 puffs into the lungs daily. (Patient taking differently: Inhale 2 puffs into the lungs daily as needed.)   No facility-administered medications prior to visit.    Review of Systems     Objective    BP 139/65 (BP Location: Left Arm, Patient Position: Sitting, Cuff Size: Normal)   Pulse 70   Ht 5' 7 (1.702 m)   SpO2 99%   BMI 33.67 kg/m    Physical Exam Vitals reviewed.  Constitutional:      General: She is not in acute distress.    Appearance: Normal appearance. She is well-developed. She is not diaphoretic.  HENT:     Head: Normocephalic and atraumatic.  Eyes:     General: No scleral icterus.    Conjunctiva/sclera: Conjunctivae normal.  Neck:     Thyroid : No thyromegaly.  Cardiovascular:     Rate and Rhythm: Normal rate and regular rhythm.     Heart sounds: Normal heart sounds. No murmur heard. Pulmonary:     Effort: Pulmonary effort is normal. No respiratory  distress.     Breath sounds: Normal breath sounds. No wheezing, rhonchi or rales.  Musculoskeletal:     Cervical back: Neck supple.     Right lower leg: No edema.     Left lower leg: No edema.  Lymphadenopathy:     Cervical: No cervical adenopathy.  Skin:    General: Skin is warm and dry.     Findings: No rash.  Neurological:     Mental Status: She is alert and oriented to person, place, and time. Mental status is at baseline.  Psychiatric:        Mood and Affect: Mood normal.        Behavior: Behavior normal.      No results found for any visits on 02/05/24.  Assessment & Plan     Problem List Items Addressed This Visit       Cardiovascular and Mediastinum   Chronic diastolic CHF (congestive heart failure) (HCC)   Known HFpEF with preserved ejection fraction. Managed by cardiology. No  signs of fluid overload or acute decompensation noted during the visit.      NSTEMI (non-ST elevated myocardial infarction) (HCC) - Primary   Recent NSTEMI with small vessel disease, managed medically. No EKG changes, normal ejection fraction on echocardiogram. Discharged on 01/26/2024. New medications include atorvastatin , metoprolol , and nitroglycerin  as needed. Plavix  prescribed for 12 months due to small vessel disease. Discussion on the stress of procedures like colonoscopy potentially triggering cardiac events. Emphasized the importance of medical management to prevent future heart attacks. - Continue atorvastatin  indefinitely - Continue metoprolol  - Use nitroglycerin  as needed for chest pain - Take Plavix  for 12 months - Discuss statin tolerance with cardiologist - Educate on keeping nitroglycerin  in its original dark bottle to maintain efficacy      Paroxysmal atrial fibrillation (HCC)   On apixaban  for stroke prevention due to paroxysmal atrial fibrillation. Discussion on the balance of anticoagulation with Plavix  for heart attack prevention. No current fall risk noted. Pharmacist  raised concerns about dual anticoagulation, but it is managed for different indications. - Continue apixaban  for stroke prevention - Avoid aspirin  due to dual anticoagulation with Plavix         Other   Chronic anticoagulation (Eliquis ) (Chronic)   Hypercholesteremia   Previously on ezetimibe  due to statin intolerance. Now on atorvastatin  post-MI. Discussion on potential myalgias from statins and the use of CoQ10 to mitigate side effects. Importance of statins in preventing future cardiac events emphasized. She expressed concern about statin intolerance and plans to discuss with cardiologist. - Continue atorvastatin  indefinitely - Continue ezetimibe  - Consider CoQ10 supplementation to mitigate statin side effects - Discuss statin tolerance with cardiologist           Fatty liver (hepatic steatosis), suspected Mildly elevated liver function tests consistent with past results, likely due to fatty liver changes.  Cardiac pacemaker Pacemaker in place, managed by electrophysiology. She expressed regret about the decision-making process for pacemaker placement.  General Health Maintenance Discussion on bone density testing. Previous normal bone density at age 43, no need for repeat testing unless new concerns arise.  Follow-up Scheduled follow-up with cardiology. Plan to discuss statin tolerance and management of medications with cardiologist. - Schedule physical for late August 2026 - Follow up with cardiology as scheduled       Return in about 11 months (around 01/04/2025) for CPE.       Jon Eva, MD  [facility] [phone] (phone) [phone] (fax)  [facility]

## 2024-02-05 NOTE — Assessment & Plan Note (Signed)
 Recent NSTEMI with small vessel disease, managed medically. No EKG changes, normal ejection fraction on echocardiogram. Discharged on 01/26/2024. New medications include atorvastatin , metoprolol , and nitroglycerin  as needed. Plavix  prescribed for 12 months due to small vessel disease. Discussion on the stress of procedures like colonoscopy potentially triggering cardiac events. Emphasized the importance of medical management to prevent future heart attacks. - Continue atorvastatin  indefinitely - Continue metoprolol  - Use nitroglycerin  as needed for chest pain - Take Plavix  for 12 months - Discuss statin tolerance with cardiologist - Educate on keeping nitroglycerin  in its original dark bottle to maintain efficacy

## 2024-02-05 NOTE — Assessment & Plan Note (Signed)
 Previously on ezetimibe  due to statin intolerance. Now on atorvastatin  post-MI. Discussion on potential myalgias from statins and the use of CoQ10 to mitigate side effects. Importance of statins in preventing future cardiac events emphasized. She expressed concern about statin intolerance and plans to discuss with cardiologist. - Continue atorvastatin  indefinitely - Continue ezetimibe  - Consider CoQ10 supplementation to mitigate statin side effects - Discuss statin tolerance with cardiologist

## 2024-02-05 NOTE — Assessment & Plan Note (Signed)
 Known HFpEF with preserved ejection fraction. Managed by cardiology. No signs of fluid overload or acute decompensation noted during the visit.

## 2024-02-06 MED ORDER — POTASSIUM CHLORIDE ER 10 MEQ PO TBCR: 20.0000 meq | EXTENDED_RELEASE_TABLET | Freq: Every day | ORAL | 10 refills | Status: AC

## 2024-02-07 ENCOUNTER — Encounter: Attending: Cardiology | Admitting: *Deleted

## 2024-02-07 DIAGNOSIS — I214 Non-ST elevation (NSTEMI) myocardial infarction: Secondary | ICD-10-CM

## 2024-02-07 NOTE — Progress Notes (Signed)
 Initial phone call completed. Diagnosis can be found in [facility] 9/9. EP Orientation scheduled for Wednesday 10/1 at 9am.

## 2024-02-14 ENCOUNTER — Encounter: Attending: Cardiology

## 2024-02-14 ENCOUNTER — Other Ambulatory Visit: Payer: Self-pay

## 2024-02-14 VITALS — Ht 66.2 in | Wt 215.2 lb

## 2024-02-14 DIAGNOSIS — I214 Non-ST elevation (NSTEMI) myocardial infarction: Secondary | ICD-10-CM | POA: Diagnosis not present

## 2024-02-14 DIAGNOSIS — I5032 Chronic diastolic (congestive) heart failure: Secondary | ICD-10-CM | POA: Diagnosis not present

## 2024-02-14 NOTE — Progress Notes (Signed)
 Cardiac Individual Treatment Plan  Patient Details  Name: Lauren Lloyd MRN: [id number] Date of Birth: 01/09/1941 Referring Provider:   Flowsheet Row Cardiac Rehab from 02/14/2024 in [facility] Cardiac and Pulmonary Rehab  Referring Provider Dr. Alm Clay    Initial Encounter Date:  Flowsheet Row Cardiac Rehab from 02/14/2024 in [facility] Cardiac and Pulmonary Rehab  Date 02/14/24    Visit Diagnosis: NSTEMI (non-ST elevated myocardial infarction) [facility])  Patient's Home Medications on Admission:  Current Outpatient Medications:    allopurinol  (ZYLOPRIM ) 100 MG tablet, TAKE ONE TABLET BY MOUTH EVERY DAY, Disp: 90 tablet, Rfl: 1   atorvastatin  (LIPITOR ) 80 MG tablet, Take 1 tablet (80 mg total) by mouth daily., Disp: 30 tablet, Rfl: 0   calcium  carbonate (TUMS EX) 750 MG chewable tablet, Chew 2 tablets by mouth daily as needed for heartburn., Disp: , Rfl:    Cholecalciferol  25 MCG (1000 UT) tablet, Take 1,000 Units by mouth daily., Disp: , Rfl:    clopidogrel  (PLAVIX ) 75 MG tablet, Take 1 tablet (75 mg total) by mouth daily., Disp: 30 tablet, Rfl: 0   dapagliflozin  propanediol (FARXIGA ) 10 MG TABS tablet, Take 1 tablet (10 mg total) by mouth daily before breakfast., Disp: 30 tablet, Rfl: 6   ELIQUIS  5 MG TABS tablet, TAKE ONE TABLET TWICE DAILY, Disp: 180 tablet, Rfl: 1   ezetimibe  (ZETIA ) 10 MG tablet, TAKE 1 TABLET BY MOUTH DAILY, Disp: 90 tablet, Rfl: 3   furosemide  (LASIX ) 40 MG tablet, TAKE 1 TABLET BY MOUTH DAILY. TAKE AN EXTRA TABLET AS NEEDED AFTER LUNCH FOR ABDOMINAL SWELLING, LEG SWELLING OR SHORTNESS OF BREATH, Disp: 180 tablet, Rfl: 1   gabapentin  (NEURONTIN ) 600 MG tablet, Take 1 tablet (600 mg total) by mouth at bedtime., Disp: 90 tablet, Rfl: 3   lactobacillus acidophilus (BACID) TABS tablet, Take 1-2 tablets by mouth 2 (two) times daily. 2 AM AND 1 HS, Disp: , Rfl:    Magnesium  300 MG CAPS, Take 2 capsules by mouth daily., Disp: , Rfl:    Menthol, Topical Analgesic, (BIOFREEZE EX),  Apply 1 application. topically daily as needed (Neck pain)., Disp: , Rfl:    metaxalone  (SKELAXIN ) 800 MG tablet, Take 1 tablet (800 mg total) by mouth daily as needed for muscle spasms., Disp: 30 tablet, Rfl: 2   metoprolol  tartrate (LOPRESSOR ) 25 MG tablet, Take 0.5 tablets (12.5 mg total) by mouth 2 (two) times daily., Disp: 30 tablet, Rfl: 0   Multiple Vitamin (MULTIVITAMIN) capsule, Take 1 capsule by mouth daily., Disp: , Rfl:    mupirocin  ointment (BACTROBAN ) 2 %, Place 1 application  into the nose 2 (two) times daily., Disp: 22 g, Rfl: 0   nitroGLYCERIN  (NITROSTAT ) 0.4 MG SL tablet, Place 1 tablet (0.4 mg total) under the tongue every 5 (five) minutes as needed for chest pain., Disp: 30 tablet, Rfl: 12   potassium chloride  (KLOR-CON ) 10 MEQ tablet, Take 2 tablets (20 mEq total) by mouth daily., Disp: 60 tablet, Rfl: 10   Tafamidis  (VYNDAMAX ) 61 MG CAPS, Take 1 capsule (61 mg total) by mouth daily., Disp: 30 capsule, Rfl: 11   Tiotropium Bromide -Olodaterol (STIOLTO RESPIMAT ) 2.5-2.5 MCG/ACT AERS, Inhale 2 puffs into the lungs daily. (Patient taking differently: Inhale 2 puffs into the lungs daily as needed.), Disp: 4 g, Rfl: 0  Past Medical History: Past Medical History:  Diagnosis Date   (HFpEF) heart failure with preserved ejection fraction (HCC)    a. 05/2018 Echo: EF 55-60%, no rwma, mild to mod MR. Nl RV fxn.  Mod TR. PASP .   Arthritis    knees, Hands   Arthritis of knee    Back pain    Carotid arterial disease    a. 03/2019 Carotid U/S: <50% bilat ICA stenoses.   CHF (congestive heart failure) (HCC)    Cholelithiasis    a. 10/2018 noted on CT.   Edema, lower extremity    Fatty liver    GERD (gastroesophageal reflux disease)    History of stress test    a. 06/2018 MV: EF 59%, no ischemia/infarct. Low risk.   Hypertension    Knee pain    Lactose intolerance    Mitral regurgitation    a. 05/2018 Echo: mild to mod MR.   Multinodular goiter    Obesity    OSA (obstructive  sleep apnea)    PAF (paroxysmal atrial fibrillation) (HCC)    a.  Diagnosed 12/19; b. 05/2018 s/p DCCV; c. 03/2019 & 05/2019 recurrent AFib-->managed w/ amio load; d. CHADS2VASc = 6 (CHF, HTN, age x 2, vascular disease, female)-->Eliquis  & amio 100 qd.   PAH (pulmonary artery hypertension) (HCC)    RSV (acute bronchiolitis due to respiratory syncytial virus) 05/10/2022   Scoliosis    SOB (shortness of breath)    Swallowing difficulty     Tobacco Use: Social History   Tobacco Use  Smoking Status Former   Current packs/day: 0.00   Average packs/day: 1 pack/day for 30.0 years (30.0 ttl pk-yrs)   Types: Cigarettes   Start date: 05/17/1959   Quit date: 05/16/1989   Years since quitting: 34.7   Passive exposure: Past  Smokeless Tobacco Never    Labs: Review Flowsheet  More data exists      Latest Ref Rng & Units 08/16/2021 03/17/2022 10/28/2022 01/11/2024 01/24/2024  Labs for ITP Cardiac and Pulmonary Rehab  Cholestrol 0 - 200 mg/dL CANCELED  837  823  782  189   LDL (calc) 0 - 99 mg/dL - 93  899  875  880   HDL-C >40 mg/dL CANCELED  52  54  71  60   Trlycerides <150 mg/dL CANCELED  94  873  875  49   Hemoglobin A1c 4.8 - 5.6 % - 6.0  6.0  5.5  -     Exercise Target Goals: Exercise Program Goal: Individual exercise prescription set using results from initial 6 min walk test and THRR while considering  patient's activity barriers and safety.   Exercise Prescription Goal: Initial exercise prescription builds to 30-45 minutes a day of aerobic activity, 2-3 days per week.  Home exercise guidelines will be given to patient during program as part of exercise prescription that the participant will acknowledge.   Education: Aerobic Exercise: - Group verbal and visual presentation on the components of exercise prescription. Introduces F.I.T.T principle from ACSM for exercise prescriptions.  Reviews F.I.T.T. principles of aerobic exercise including progression. Written material provided at class  time. Flowsheet Row Cardiac Rehab from 02/14/2024 in [facility] Cardiac and Pulmonary Rehab  Education need identified 02/14/24    Education: Resistance Exercise: - Group verbal and visual presentation on the components of exercise prescription. Introduces F.I.T.T principle from ACSM for exercise prescriptions  Reviews F.I.T.T. principles of resistance exercise including progression. Written material provided at class time.    Education: Exercise & Equipment Safety: - Individual verbal instruction and demonstration of equipment use and safety with use of the equipment. Flowsheet Row Cardiac Rehab from 02/14/2024 in [facility] Cardiac and Pulmonary Rehab  Date 02/14/24  Educator  [facility]  Instruction Review Code 1- Verbalizes Understanding    Education: Exercise Physiology & General Exercise Guidelines: - Group verbal and written instruction with models to review the exercise physiology of the cardiovascular system and associated critical values. Provides general exercise guidelines with specific guidelines to those with heart or lung disease. Written material provided at class time. Flowsheet Row Cardiac Rehab from 02/14/2024 in [facility] Cardiac and Pulmonary Rehab  Education need identified 02/14/24    Education: Flexibility, Balance, Mind/Body Relaxation: - Group verbal and visual presentation with interactive activity on the components of exercise prescription. Introduces F.I.T.T principle from ACSM for exercise prescriptions. Reviews F.I.T.T. principles of flexibility and balance exercise training including progression. Also discusses the mind body connection.  Reviews various relaxation techniques to help reduce and manage stress (i.e. Deep breathing, progressive muscle relaxation, and visualization). Balance handout provided to take home. Written material provided at class time.   Activity Barriers & Risk Stratification:  Activity Barriers & Cardiac Risk Stratification - 02/14/24 1027       Activity  Barriers & Cardiac Risk Stratification   Activity Barriers Arthritis;Back Problems;Balance Concerns    Cardiac Risk Stratification Moderate          6 Minute Walk:  6 Minute Walk     Row Name 02/14/24 1026         6 Minute Walk   Phase Initial     Distance 1180 feet     Walk Time 6 minutes     # of Rest Breaks 0     MPH 2.2     METS 2.1     RPE 13     Perceived Dyspnea  3     VO2 Peak 7.4     Symptoms Yes (comment)     Comments SOB     Resting HR 72 bpm     Resting BP 114/52     Resting Oxygen Saturation  96 %     Exercise Oxygen Saturation  during 6 min walk 95 %     Max Ex. HR 131 bpm     Max Ex. BP 162/68     2 Minute Post BP 126/66        Oxygen Initial Assessment:   Oxygen Re-Evaluation:   Oxygen Discharge (Final Oxygen Re-Evaluation):   Initial Exercise Prescription:  Initial Exercise Prescription - 02/14/24 1000       Date of Initial Exercise RX and Referring Provider   Date 02/14/24    Referring Provider Dr. Alm Clay      Oxygen   Maintain Oxygen Saturation 88% or higher      Treadmill   MPH 2    Grade 0    Minutes 15    METs 2.53      Recumbant Bike   Level 3    RPM 50    Watts 25    Minutes 15    METs 2.1      NuStep   Level 3    SPM 80    Minutes 15    METs 2.1      REL-XR   Level 3    Watts 25    Speed 50    Minutes 15    METs 2.1      T5 Nustep   Level 3    Minutes 15    METs 2.1      Track   Laps 31    Minutes 15    METs 2.69  Prescription Details   Duration Progress to 30 minutes of continuous aerobic without signs/symptoms of physical distress      Intensity   THRR 40-80% of Max Heartrate 97-123    Ratings of Perceived Exertion 11-13    Perceived Dyspnea 0-4      Progression   Progression Continue to progress workloads to maintain intensity without signs/symptoms of physical distress.      Resistance Training   Training Prescription Yes    Weight 4lb    Reps 10-15          Perform  Capillary Blood Glucose checks as needed.  Exercise Prescription Changes:   Exercise Prescription Changes     Row Name 08/24/23 1700 02/14/24 1000           Response to Exercise   Blood Pressure (Admit) 142/80 114/52      Blood Pressure (Exercise) -- 162/68      Blood Pressure (Exit) 122/72 126/66      Heart Rate (Admit) 76 bpm 72 bpm      Heart Rate (Exercise) 112 bpm 131 bpm      Heart Rate (Exit) 70 bpm 78 bpm      Oxygen Saturation (Admit) 97 % 96 %      Oxygen Saturation (Exercise) 91 % 95 %      Oxygen Saturation (Exit) 95 % 95 %      Rating of Perceived Exertion (Exercise) 13 13      Perceived Dyspnea (Exercise) 3 3      Symptoms none none      Comments -- results      Duration Continue with 30 min of aerobic exercise without signs/symptoms of physical distress. --      Intensity THRR unchanged --        Progression   Progression Continue to progress workloads to maintain intensity without signs/symptoms of physical distress. --      Average METs 2.76 --        Resistance Training   Training Prescription Yes --      Weight 6 lb --      Reps 10-15 --        Interval Training   Interval Training No --        Biostep-RELP   Level 1 --      Minutes 15 --      METs 2 --        Track   Laps 42  hall --      Minutes 15 --      METs 3.28 --        Home Exercise Plan   Plans to continue exercise at Home (comment)  Marchell plans to try walking for 30 minutes twice a week, outside at home. She struggles walking for 15 minutes in the program but states she can break up the home ex. to 15 minute intervals. --      Frequency Add 2 additional days to program exercise sessions. --      Initial Home Exercises Provided 04/20/23 --        Oxygen   Maintain Oxygen Saturation 88% or higher --         Exercise Comments:   Exercise Goals and Review:   Exercise Goals     Row Name 02/14/24 1031             Exercise Goals   Increase Physical Activity Yes        Intervention Provide  advice, education, support and counseling about physical activity/exercise needs.;Develop an individualized exercise prescription for aerobic and resistive training based on initial evaluation findings, risk stratification, comorbidities and participant's personal goals.       Expected Outcomes Short Term: Attend rehab on a regular basis to increase amount of physical activity.;Long Term: Exercising regularly at least 3-5 days a week.;Long Term: Add in home exercise to make exercise part of routine and to increase amount of physical activity.       Increase Strength and Stamina Yes       Intervention Provide advice, education, support and counseling about physical activity/exercise needs.;Develop an individualized exercise prescription for aerobic and resistive training based on initial evaluation findings, risk stratification, comorbidities and participant's personal goals.       Expected Outcomes Short Term: Increase workloads from initial exercise prescription for resistance, speed, and METs.;Short Term: Perform resistance training exercises routinely during rehab and add in resistance training at home;Long Term: Improve cardiorespiratory fitness, muscular endurance and strength as measured by increased METs and functional capacity ( )       Able to understand and use rate of perceived exertion (RPE) scale Yes       Intervention Provide education and explanation on how to use RPE scale       Expected Outcomes Short Term: Able to use RPE daily in rehab to express subjective intensity level;Long Term:  Able to use RPE to guide intensity level when exercising independently       Able to understand and use Dyspnea scale Yes       Intervention Provide education and explanation on how to use Dyspnea scale       Expected Outcomes Short Term: Able to use Dyspnea scale daily in rehab to express subjective sense of shortness of breath during exertion;Long Term: Able to use Dyspnea scale  to guide intensity level when exercising independently       Knowledge and understanding of Target Heart Rate Range (THRR) Yes       Intervention Provide education and explanation of THRR including how the numbers were predicted and where they are located for reference       Expected Outcomes Short Term: Able to state/look up THRR;Long Term: Able to use THRR to govern intensity when exercising independently;Short Term: Able to use daily as guideline for intensity in rehab       Able to check pulse independently Yes       Intervention Provide education and demonstration on how to check pulse in carotid and radial arteries.;Review the importance of being able to check your own pulse for safety during independent exercise       Expected Outcomes Short Term: Able to explain why pulse checking is important during independent exercise;Long Term: Able to check pulse independently and accurately       Understanding of Exercise Prescription Yes       Intervention Provide education, explanation, and written materials on patient's individual exercise prescription       Expected Outcomes Short Term: Able to explain program exercise prescription;Long Term: Able to explain home exercise prescription to exercise independently          Exercise Goals Re-Evaluation :  Exercise Goals Re-Evaluation     Row Name 08/24/23 1723             Exercise Goal Re-Evaluation   Exercise Goals Review Increase Physical Activity;Increase Strength and Stamina;Understanding of Exercise Prescription       Comments Beuna is doing well  in rehab. She will graduate in a few sessions. She has maintained level 1 on the biostep and 42 laps on the hall track. We will continue to monitor her progress in the program until graduation.       Expected Outcomes Short: Graduate. Long: Continue to exercise independently.          Discharge Exercise Prescription (Final Exercise Prescription Changes):  Exercise Prescription Changes -  02/14/24 1000       Response to Exercise   Blood Pressure (Admit) 114/52    Blood Pressure (Exercise) 162/68    Blood Pressure (Exit) 126/66    Heart Rate (Admit) 72 bpm    Heart Rate (Exercise) 131 bpm    Heart Rate (Exit) 78 bpm    Oxygen Saturation (Admit) 96 %    Oxygen Saturation (Exercise) 95 %    Oxygen Saturation (Exit) 95 %    Rating of Perceived Exertion (Exercise) 13    Perceived Dyspnea (Exercise) 3    Symptoms none    Comments results          Nutrition:  Target Goals: Understanding of nutrition guidelines, daily intake of sodium 1500mg , cholesterol 200mg , calories 30% from fat and 7% or less from saturated fats, daily to have 5 or more servings of fruits and vegetables.  Education: Nutrition 1 -Group instruction provided by verbal, written material, interactive activities, discussions, models, and posters to present general guidelines for heart healthy nutrition including macronutrients, label reading, and promoting whole foods over processed counterparts. Education serves as Pensions consultant of discussion of heart healthy eating for all. Written material provided at class time.    Education: Nutrition 2 -Group instruction provided by verbal, written material, interactive activities, discussions, models, and posters to present general guidelines for heart healthy nutrition including sodium, cholesterol, and saturated fat. Providing guidance of habit forming to improve blood pressure, cholesterol, and body weight. Written material provided at class time.     Biometrics:  Pre Biometrics - 02/14/24 1032       Pre Biometrics   Height 5' 6.2 (1.681 m)    Weight 215 lb 3.2 oz (97.6 kg)    Waist Circumference 40 inches    Hip Circumference 47 inches    Waist to Hip Ratio 0.85 %    BMI (Calculated) 34.54    Single Leg Stand 3 seconds           Nutrition Therapy Plan and Nutrition Goals:   Nutrition Assessments:  MEDIFICTS Score Key: >=70 Need to  make dietary changes  40-70 Heart Healthy Diet <= 40 Therapeutic Level Cholesterol Diet  Flowsheet Row Cardiac Rehab from 02/14/2024 in [facility] Cardiac and Pulmonary Rehab  Picture Your Plate Total Score on Admission 54   Picture Your Plate Scores: <59 Unhealthy dietary pattern with much room for improvement. 41-50 Dietary pattern unlikely to meet recommendations for good health and room for improvement. 51-60 More healthful dietary pattern, with some room for improvement.  >60 Healthy dietary pattern, although there may be some specific behaviors that could be improved.    Nutrition Goals Re-Evaluation:   Nutrition Goals Discharge (Final Nutrition Goals Re-Evaluation):   Psychosocial: Target Goals: Acknowledge presence or absence of significant depression and/or stress, maximize coping skills, provide positive support system. Participant is able to verbalize types and ability to use techniques and skills needed for reducing stress and depression.   Education: Stress, Anxiety, and Depression - Group verbal and visual presentation to define topics covered.  Reviews how body is  impacted by stress, anxiety, and depression.  Also discusses healthy ways to reduce stress and to treat/manage anxiety and depression. Written material provided at class time.   Education: Sleep Hygiene -Provides group verbal and written instruction about how sleep can affect your health.  Define sleep hygiene, discuss sleep cycles and impact of sleep habits. Review good sleep hygiene tips.   Initial Review & Psychosocial Screening:  Initial Psych Review & Screening - 02/07/24 1014       Initial Review   Current issues with None Identified      Family Dynamics   Good Support System? Yes      Barriers   Psychosocial barriers to participate in program The patient should benefit from training in stress management and relaxation.;There are no identifiable barriers or psychosocial needs.      Screening  Interventions   Interventions Encouraged to exercise;To provide support and resources with identified psychosocial needs;Provide feedback about the scores to participant    Expected Outcomes Short Term goal: Utilizing psychosocial counselor, staff and physician to assist with identification of specific Stressors or current issues interfering with healing process. Setting desired goal for each stressor or current issue identified.;Long Term Goal: Stressors or current issues are controlled or eliminated.;Short Term goal: Identification and review with participant of any Quality of Life or Depression concerns found by scoring the questionnaire.;Long Term goal: The participant improves quality of Life and PHQ9 Scores as seen by post scores and/or verbalization of changes          Quality of Life Scores:   Quality of Life - 02/14/24 1032       Quality of Life   Select Quality of Life      Quality of Life Scores   Health/Function Pre 20.47 %    Socioeconomic Pre 26.25 %    Psych/Spiritual Pre 30 %    Family Pre 21.6 %    GLOBAL Pre 23.86 %         Scores of 19 and below usually indicate a poorer quality of life in these areas.  A difference of  2-3 points is a clinically meaningful difference.  A difference of 2-3 points in the total score of the Quality of Life Index has been associated with significant improvement in overall quality of life, self-image, physical symptoms, and general health in studies assessing change in quality of life.  PHQ-9: Review Flowsheet  More data exists      02/14/2024 01/17/2024 01/11/2024 12/07/2023 11/10/2023  Depression screen PHQ 2/9  Decreased Interest 0 0 0 0 0  Down, Depressed, Hopeless 0 0 0 0 0  PHQ - 2 Score 0 0 0 0 0  Altered sleeping 0 0 1 0 -  Tired, decreased energy 1 0 1 1 -  Change in appetite 0 0 1 0 -  Feeling bad or failure about yourself  0 0 - 0 -  Trouble concentrating 0 0 0 0 -  Moving slowly or fidgety/restless 0 0 0 0 -  Suicidal  thoughts 0 0 0 0 -  PHQ-9 Score 1 0 3 1 -  Difficult doing work/chores Not difficult at all Not difficult at all Somewhat difficult Somewhat difficult -   Interpretation of Total Score  Total Score Depression Severity:  1-4 = Minimal depression, 5-9 = Mild depression, 10-14 = Moderate depression, 15-19 = Moderately severe depression, 20-27 = Severe depression   Psychosocial Evaluation and Intervention:  Psychosocial Evaluation - 02/07/24 1026  Psychosocial Evaluation & Interventions   Interventions Encouraged to exercise with the program and follow exercise prescription    Comments Ms. Lasheika is coming to cardiac rehab after a NSTEMI. When asked about stress, she states she is under a normal amount of stress that any body else would be and she feels like she is handling it well. She reports no sleep concerns either. She is ready to start the program.    Expected Outcomes Short: attend cardiac rehab for education and exercise Long: develop and maintain positive self care habits    Continue Psychosocial Services  Follow up required by staff          Psychosocial Re-Evaluation:   Psychosocial Discharge (Final Psychosocial Re-Evaluation):   Vocational Rehabilitation: Provide vocational rehab assistance to qualifying candidates.   Vocational Rehab Evaluation & Intervention:   Education: Education Goals: Education classes will be provided on a variety of topics geared toward better understanding of heart health and risk factor modification. Participant will state understanding/return demonstration of topics presented as noted by education test scores.  Learning Barriers/Preferences:  Learning Barriers/Preferences - 02/07/24 1026       Learning Barriers/Preferences   Learning Barriers None    Learning Preferences None          General Cardiac Education Topics:  AED/CPR: - Group verbal and written instruction with the use of models to demonstrate the basic use of the  AED with the basic ABC's of resuscitation.   Test and Procedures: - Group verbal and visual presentation and models provide information about basic cardiac anatomy and function. Reviews the testing methods done to diagnose heart disease and the outcomes of the test results. Describes the treatment choices: Medical Management, Angioplasty, or Coronary Bypass Surgery for treating various heart conditions including Myocardial Infarction, Angina, Valve Disease, and Cardiac Arrhythmias. Written material provided at class time.   Medication Safety: - Group verbal and visual instruction to review commonly prescribed medications for heart and lung disease. Reviews the medication, class of the drug, and side effects. Includes the steps to properly store meds and maintain the prescription regimen. Written material provided at class time.   Intimacy: - Group verbal instruction through game format to discuss how heart and lung disease can affect sexual intimacy. Written material provided at class time.   Know Your Numbers and Heart Failure: - Group verbal and visual instruction to discuss disease risk factors for cardiac and pulmonary disease and treatment options.  Reviews associated critical values for Overweight/Obesity, Hypertension, Cholesterol, and Diabetes.  Discusses basics of heart failure: signs/symptoms and treatments.  Introduces Heart Failure Zone chart for action plan for heart failure. Written material provided at class time.   Infection Prevention: - Provides verbal and written material to individual with discussion of infection control including proper hand washing and proper equipment cleaning during exercise session. Flowsheet Row Cardiac Rehab from 02/14/2024 in [facility] Cardiac and Pulmonary Rehab  Date 02/14/24  Educator [facility]  Instruction Review Code 1- Verbalizes Understanding    Falls Prevention: - Provides verbal and written material to individual with discussion of falls prevention  and safety. Flowsheet Row Cardiac Rehab from 02/14/2024 in [facility] Cardiac and Pulmonary Rehab  Date 02/14/24  Educator [facility]  Instruction Review Code 1- Verbalizes Understanding    Other: -Provides group and verbal instruction on various topics (see comments)   Knowledge Questionnaire Score:  Knowledge Questionnaire Score - 02/14/24 1033       Knowledge Questionnaire Score   Pre Score 23/26  Core Components/Risk Factors/Patient Goals at Admission:  Personal Goals and Risk Factors at Admission - 02/07/24 1013       Core Components/Risk Factors/Patient Goals on Admission    Weight Management Yes;Weight Loss    Intervention Weight Management: Develop a combined nutrition and exercise program designed to reach desired caloric intake, while maintaining appropriate intake of nutrient and fiber, sodium and fats, and appropriate energy expenditure required for the weight goal.;Weight Management: Provide education and appropriate resources to help participant work on and attain dietary goals.;Weight Management/Obesity: Establish reasonable short term and long term weight goals.;Obesity: Provide education and appropriate resources to help participant work on and attain dietary goals.    Expected Outcomes Short Term: Continue to assess and modify interventions until short term weight is achieved;Long Term: Adherence to nutrition and physical activity/exercise program aimed toward attainment of established weight goal;Weight Maintenance: Understanding of the daily nutrition guidelines, which includes 25-35% calories from fat, 7% or less cal from saturated fats, less than 200mg  cholesterol, less than 1.5gm of sodium, & 5 or more servings of fruits and vegetables daily;Weight Loss: Understanding of general recommendations for a balanced deficit meal plan, which promotes 1-2 lb weight loss per week and includes a negative energy balance of [phone] kcal/d;Understanding recommendations for meals to  include 15-35% energy as protein, 25-35% energy from fat, 35-60% energy from carbohydrates, less than 200mg  of dietary cholesterol, 20-35 gm of total fiber daily;Understanding of distribution of calorie intake throughout the day with the consumption of 4-5 meals/snacks    Improve shortness of breath with ADL's Yes    Intervention Provide education, individualized exercise plan and daily activity instruction to help decrease symptoms of SOB with activities of daily living.    Expected Outcomes Short Term: Improve cardiorespiratory fitness to achieve a reduction of symptoms when performing ADLs;Long Term: Be able to perform more ADLs without symptoms or delay the onset of symptoms    Heart Failure Yes    Intervention Provide a combined exercise and nutrition program that is supplemented with education, support and counseling about heart failure. Directed toward relieving symptoms such as shortness of breath, decreased exercise tolerance, and extremity edema.    Expected Outcomes Improve functional capacity of life;Short term: Attendance in program 2-3 days a week with increased exercise capacity. Reported lower sodium intake. Reported increased fruit and vegetable intake. Reports medication compliance.;Short term: Daily weights obtained and reported for increase. Utilizing diuretic protocols set by physician.;Long term: Adoption of self-care skills and reduction of barriers for early signs and symptoms recognition and intervention leading to self-care maintenance.    Hypertension Yes    Intervention Provide education on lifestyle modifcations including regular physical activity/exercise, weight management, moderate sodium restriction and increased consumption of fresh fruit, vegetables, and low fat dairy, alcohol moderation, and smoking cessation.;Monitor prescription use compliance.    Expected Outcomes Short Term: Continued assessment and intervention until BP is < 140/63mm HG in hypertensive participants.  < 130/30mm HG in hypertensive participants with diabetes, heart failure or chronic kidney disease.;Long Term: Maintenance of blood pressure at goal levels.    Lipids Yes    Intervention Provide education and support for participant on nutrition & aerobic/resistive exercise along with prescribed medications to achieve LDL 70mg , HDL >40mg .    Expected Outcomes Short Term: Participant states understanding of desired cholesterol values and is compliant with medications prescribed. Participant is following exercise prescription and nutrition guidelines.;Long Term: Cholesterol controlled with medications as prescribed, with individualized exercise RX and with personalized nutrition plan. Value  goals: LDL < 70mg , HDL > 40 mg.          Education:Diabetes - Individual verbal and written instruction to review signs/symptoms of diabetes, desired ranges of glucose level fasting, after meals and with exercise. Acknowledge that pre and post exercise glucose checks will be done for 3 sessions at entry of program.   Core Components/Risk Factors/Patient Goals Review:    Core Components/Risk Factors/Patient Goals at Discharge (Final Review):    ITP Comments:  ITP Comments     Row Name 08/23/23 0926 02/07/24 1019 02/14/24 1026       ITP Comments 30 Day review completed. Medical Director ITP review done, changes made as directed, and signed approval by Medical Director. Initial phone call completed. Diagnosis can be found in [facility] 9/9. EP Orientation scheduled for Wednesday 10/1 at 9am. Completed and gym orientation for cardiac rehab. Initial ITP created and sent for review to Dr. Oneil Pinal, Medical Director.        Comments: Initial ITP

## 2024-02-14 NOTE — Patient Instructions (Signed)
 Patient Instructions  Patient Details  Name: Lauren Lloyd MRN: [id number] Date of Birth: Sep 30, 1940 Referring Provider:  Anner Alm ORN, MD  Below are your personal goals for exercise, nutrition, and risk factors. Our goal is to help you stay on track towards obtaining and maintaining these goals. We will be discussing your progress on these goals with you throughout the program.  Initial Exercise Prescription:  Initial Exercise Prescription - 02/14/24 1000       Date of Initial Exercise RX and Referring Provider   Date 02/14/24    Referring Provider Dr. Alm Anner      Oxygen   Maintain Oxygen Saturation 88% or higher      Treadmill   MPH 2    Grade 0    Minutes 15    METs 2.53      Recumbant Bike   Level 3    RPM 50    Watts 25    Minutes 15    METs 2.1      NuStep   Level 3    SPM 80    Minutes 15    METs 2.1      REL-XR   Level 3    Watts 25    Speed 50    Minutes 15    METs 2.1      T5 Nustep   Level 3    Minutes 15    METs 2.1      Track   Laps 31    Minutes 15    METs 2.69      Prescription Details   Duration Progress to 30 minutes of continuous aerobic without signs/symptoms of physical distress      Intensity   THRR 40-80% of Max Heartrate 97-123    Ratings of Perceived Exertion 11-13    Perceived Dyspnea 0-4      Progression   Progression Continue to progress workloads to maintain intensity without signs/symptoms of physical distress.      Resistance Training   Training Prescription Yes    Weight 4lb    Reps 10-15          Exercise Goals: Frequency: Be able to perform aerobic exercise two to three times per week in program working toward 2-5 days per week of home exercise.  Intensity: Work with a perceived exertion of 11 (fairly light) - 15 (hard) while following your exercise prescription.  We will make changes to your prescription with you as you progress through the program.   Duration: Be able to do 30 to 45 minutes  of continuous aerobic exercise in addition to a 5 minute warm-up and a 5 minute cool-down routine.   Nutrition Goals: Your personal nutrition goals will be established when you do your nutrition analysis with the dietician.  The following are general nutrition guidelines to follow: Cholesterol < 200mg /day Sodium < 1500mg /day Fiber: Women over 50 yrs - 21 grams per day  Personal Goals:  Personal Goals and Risk Factors at Admission - 02/07/24 1013       Core Components/Risk Factors/Patient Goals on Admission    Weight Management Yes;Weight Loss    Intervention Weight Management: Develop a combined nutrition and exercise program designed to reach desired caloric intake, while maintaining appropriate intake of nutrient and fiber, sodium and fats, and appropriate energy expenditure required for the weight goal.;Weight Management: Provide education and appropriate resources to help participant work on and attain dietary goals.;Weight Management/Obesity: Establish reasonable short term and long term weight  goals.;Obesity: Provide education and appropriate resources to help participant work on and attain dietary goals.    Expected Outcomes Short Term: Continue to assess and modify interventions until short term weight is achieved;Long Term: Adherence to nutrition and physical activity/exercise program aimed toward attainment of established weight goal;Weight Maintenance: Understanding of the daily nutrition guidelines, which includes 25-35% calories from fat, 7% or less cal from saturated fats, less than 200mg  cholesterol, less than 1.5gm of sodium, & 5 or more servings of fruits and vegetables daily;Weight Loss: Understanding of general recommendations for a balanced deficit meal plan, which promotes 1-2 lb weight loss per week and includes a negative energy balance of [phone] kcal/d;Understanding recommendations for meals to include 15-35% energy as protein, 25-35% energy from fat, 35-60% energy from  carbohydrates, less than 200mg  of dietary cholesterol, 20-35 gm of total fiber daily;Understanding of distribution of calorie intake throughout the day with the consumption of 4-5 meals/snacks    Improve shortness of breath with ADL's Yes    Intervention Provide education, individualized exercise plan and daily activity instruction to help decrease symptoms of SOB with activities of daily living.    Expected Outcomes Short Term: Improve cardiorespiratory fitness to achieve a reduction of symptoms when performing ADLs;Long Term: Be able to perform more ADLs without symptoms or delay the onset of symptoms    Heart Failure Yes    Intervention Provide a combined exercise and nutrition program that is supplemented with education, support and counseling about heart failure. Directed toward relieving symptoms such as shortness of breath, decreased exercise tolerance, and extremity edema.    Expected Outcomes Improve functional capacity of life;Short term: Attendance in program 2-3 days a week with increased exercise capacity. Reported lower sodium intake. Reported increased fruit and vegetable intake. Reports medication compliance.;Short term: Daily weights obtained and reported for increase. Utilizing diuretic protocols set by physician.;Long term: Adoption of self-care skills and reduction of barriers for early signs and symptoms recognition and intervention leading to self-care maintenance.    Hypertension Yes    Intervention Provide education on lifestyle modifcations including regular physical activity/exercise, weight management, moderate sodium restriction and increased consumption of fresh fruit, vegetables, and low fat dairy, alcohol moderation, and smoking cessation.;Monitor prescription use compliance.    Expected Outcomes Short Term: Continued assessment and intervention until BP is < 140/22mm HG in hypertensive participants. < 130/42mm HG in hypertensive participants with diabetes, heart failure or  chronic kidney disease.;Long Term: Maintenance of blood pressure at goal levels.    Lipids Yes    Intervention Provide education and support for participant on nutrition & aerobic/resistive exercise along with prescribed medications to achieve LDL 70mg , HDL >40mg .    Expected Outcomes Short Term: Participant states understanding of desired cholesterol values and is compliant with medications prescribed. Participant is following exercise prescription and nutrition guidelines.;Long Term: Cholesterol controlled with medications as prescribed, with individualized exercise RX and with personalized nutrition plan. Value goals: LDL < 70mg , HDL > 40 mg.          Exercise Goals and Review:  Exercise Goals     Row Name 02/14/24 1031             Exercise Goals   Increase Physical Activity Yes       Intervention Provide advice, education, support and counseling about physical activity/exercise needs.;Develop an individualized exercise prescription for aerobic and resistive training based on initial evaluation findings, risk stratification, comorbidities and participant's personal goals.       Expected Outcomes Short  Term: Attend rehab on a regular basis to increase amount of physical activity.;Long Term: Exercising regularly at least 3-5 days a week.;Long Term: Add in home exercise to make exercise part of routine and to increase amount of physical activity.       Increase Strength and Stamina Yes       Intervention Provide advice, education, support and counseling about physical activity/exercise needs.;Develop an individualized exercise prescription for aerobic and resistive training based on initial evaluation findings, risk stratification, comorbidities and participant's personal goals.       Expected Outcomes Short Term: Increase workloads from initial exercise prescription for resistance, speed, and METs.;Short Term: Perform resistance training exercises routinely during rehab and add in  resistance training at home;Long Term: Improve cardiorespiratory fitness, muscular endurance and strength as measured by increased METs and functional capacity ( )       Able to understand and use rate of perceived exertion (RPE) scale Yes       Intervention Provide education and explanation on how to use RPE scale       Expected Outcomes Short Term: Able to use RPE daily in rehab to express subjective intensity level;Long Term:  Able to use RPE to guide intensity level when exercising independently       Able to understand and use Dyspnea scale Yes       Intervention Provide education and explanation on how to use Dyspnea scale       Expected Outcomes Short Term: Able to use Dyspnea scale daily in rehab to express subjective sense of shortness of breath during exertion;Long Term: Able to use Dyspnea scale to guide intensity level when exercising independently       Knowledge and understanding of Target Heart Rate Range (THRR) Yes       Intervention Provide education and explanation of THRR including how the numbers were predicted and where they are located for reference       Expected Outcomes Short Term: Able to state/look up THRR;Long Term: Able to use THRR to govern intensity when exercising independently;Short Term: Able to use daily as guideline for intensity in rehab       Able to check pulse independently Yes       Intervention Provide education and demonstration on how to check pulse in carotid and radial arteries.;Review the importance of being able to check your own pulse for safety during independent exercise       Expected Outcomes Short Term: Able to explain why pulse checking is important during independent exercise;Long Term: Able to check pulse independently and accurately       Understanding of Exercise Prescription Yes       Intervention Provide education, explanation, and written materials on patient's individual exercise prescription       Expected Outcomes Short Term: Able to  explain program exercise prescription;Long Term: Able to explain home exercise prescription to exercise independently          Copy of goals given to participant.

## 2024-02-15 ENCOUNTER — Other Ambulatory Visit: Payer: Self-pay

## 2024-02-16 ENCOUNTER — Other Ambulatory Visit: Payer: Self-pay

## 2024-02-16 NOTE — Progress Notes (Signed)
 Clinical Intervention Note  Clinical Intervention Notes: Patient reports initiating atorvastatin  80mg , clopidogrel , metoprolol  tartrate 25mg  & Nitrostat . No DDIs identified with Vyndamax .   Clinical Intervention Outcomes: Prevention of an adverse drug event   Delon CHRISTELLA Brow Specialty Pharmacist

## 2024-02-16 NOTE — Progress Notes (Signed)
 Specialty Pharmacy Refill Coordination Note  Lauren Lloyd is a 83 y.o. female contacted today regarding refills of specialty medication(s) Tafamidis  (Vyndamax )   Patient requested Delivery   Delivery date: 02/20/24   Verified address: [address] [place] [address]   Medication will be filled on 02/19/24.

## 2024-02-19 ENCOUNTER — Other Ambulatory Visit: Payer: Self-pay

## 2024-02-19 ENCOUNTER — Encounter

## 2024-02-19 DIAGNOSIS — I214 Non-ST elevation (NSTEMI) myocardial infarction: Secondary | ICD-10-CM

## 2024-02-19 DIAGNOSIS — I5032 Chronic diastolic (congestive) heart failure: Secondary | ICD-10-CM | POA: Diagnosis not present

## 2024-02-19 NOTE — Progress Notes (Signed)
 Daily Session Note  Patient Details  Name: Lauren Lloyd MRN: [id number] Date of Birth: 06-30-1940 Referring Provider:   Flowsheet Row Cardiac Rehab from 02/14/2024 in [facility] Cardiac and Pulmonary Rehab  Referring Provider Dr. Alm Clay    Encounter Date: 02/19/2024  Check In:  Session Check In - 02/19/24 0920       Check-In   Supervising physician immediately available to respond to emergencies See telemetry face sheet for immediately available ER MD    Location [facility]-Cardiac & Pulmonary Rehab    Staff Present Burnard Davenport RN,BSN,MPA;Joseph [facility] RCP,RRT,BSRT;Maxon Burnell BS, Exercise Physiologist;Alix Stowers Dyane BS, ACSM CEP, Exercise Physiologist    Virtual Visit No    Medication changes reported     No    Fall or balance concerns reported    No    Tobacco Cessation No Change    Warm-up and Cool-down Performed on first and last piece of equipment    Resistance Training Performed Yes    VAD Patient? No    PAD/SET Patient? No      Pain Assessment   Currently in Pain? No/denies             Social History   Tobacco Use  Smoking Status Former   Current packs/day: 0.00   Average packs/day: 1 pack/day for 30.0 years (30.0 ttl pk-yrs)   Types: Cigarettes   Start date: 05/17/1959   Quit date: 05/16/1989   Years since quitting: 34.7   Passive exposure: Past  Smokeless Tobacco Never    Goals Met:  Independence with exercise equipment Exercise tolerated well No report of concerns or symptoms today Strength training completed today  Goals Unmet:  Not Applicable  Comments: First full day of exercise!  Patient was oriented to gym and equipment including functions, settings, policies, and procedures.  Patient's individual exercise prescription and treatment plan were reviewed.  All starting workloads were established based on the results of the 6 minute walk test done at initial orientation visit.  The plan for exercise progression was also introduced and progression will be  customized based on patient's performance and goals.    Dr. Oneil Pinal is Medical Director for [facility] Cardiac Rehabilitation.  Dr. Fuad Aleskerov is Medical Director for [facility] Pulmonary Rehabilitation.

## 2024-02-21 ENCOUNTER — Encounter

## 2024-02-21 DIAGNOSIS — I214 Non-ST elevation (NSTEMI) myocardial infarction: Secondary | ICD-10-CM

## 2024-02-21 DIAGNOSIS — I5032 Chronic diastolic (congestive) heart failure: Secondary | ICD-10-CM | POA: Diagnosis not present

## 2024-02-21 NOTE — Progress Notes (Signed)
 Daily Session Note  Patient Details  Name: ARIEON SCALZO MRN: [id number] Date of Birth: Jun 11, 1940 Referring Provider:   Flowsheet Row Cardiac Rehab from 02/14/2024 in [facility] Cardiac and Pulmonary Rehab  Referring Provider Dr. Alm Clay    Encounter Date: 02/21/2024  Check In:  Session Check In - 02/21/24 0948       Check-In   Supervising physician immediately available to respond to emergencies See telemetry face sheet for immediately available ER MD    Location [facility]-Cardiac & Pulmonary Rehab    Staff Present Burnard Davenport [facility], RN, DNP, NE-BC;Joseph [facility] RN,BSN;Margaret Best, MS, Exercise Physiologist;Tirzah Fross Dyane BS, ACSM CEP, Exercise Physiologist    Virtual Visit No    Medication changes reported     No    Fall or balance concerns reported    No    Tobacco Cessation No Change    Warm-up and Cool-down Performed on first and last piece of equipment    Resistance Training Performed Yes    VAD Patient? No    PAD/SET Patient? No      Pain Assessment   Currently in Pain? No/denies             Social History   Tobacco Use  Smoking Status Former   Current packs/day: 0.00   Average packs/day: 1 pack/day for 30.0 years (30.0 ttl pk-yrs)   Types: Cigarettes   Start date: 05/17/1959   Quit date: 05/16/1989   Years since quitting: 34.7   Passive exposure: Past  Smokeless Tobacco Never    Goals Met:  Independence with exercise equipment Exercise tolerated well No report of concerns or symptoms today Strength training completed today  Goals Unmet:  Not Applicable  Comments: Pt able to follow exercise prescription today without complaint.  Will continue to monitor for progression.    Dr. Oneil Pinal is Medical Director for [facility] Cardiac Rehabilitation.  Dr. Fuad Aleskerov is Medical Director for [facility] Pulmonary Rehabilitation.

## 2024-02-23 ENCOUNTER — Encounter: Admitting: *Deleted

## 2024-02-23 DIAGNOSIS — I214 Non-ST elevation (NSTEMI) myocardial infarction: Secondary | ICD-10-CM | POA: Diagnosis not present

## 2024-02-23 DIAGNOSIS — I5032 Chronic diastolic (congestive) heart failure: Secondary | ICD-10-CM | POA: Diagnosis not present

## 2024-02-23 NOTE — Progress Notes (Signed)
 Daily Session Note  Patient Details  Name: CYNDEL GRIFFEY MRN: [id number] Date of Birth: February 07, 1941 Referring Provider:   Flowsheet Row Cardiac Rehab from 02/14/2024 in [facility] Cardiac and Pulmonary Rehab  Referring Provider Dr. Alm Clay    Encounter Date: 02/23/2024  Check In:  Session Check In - 02/23/24 0926       Check-In   Supervising physician immediately available to respond to emergencies See telemetry face sheet for immediately available ER MD    Location [facility]-Cardiac & Pulmonary Rehab    Staff Present Maxon Burnell HECKLE, Exercise Physiologist;Kvion Shapley Jacques RN,BSN;Mary Godley, RN, DNP, NE-BC;Noah Tickle, BS, Exercise Physiologist    Virtual Visit No    Medication changes reported     No    Fall or balance concerns reported    No    Warm-up and Cool-down Performed on first and last piece of equipment    Resistance Training Performed Yes    VAD Patient? No    PAD/SET Patient? No      Pain Assessment   Currently in Pain? No/denies             Social History   Tobacco Use  Smoking Status Former   Current packs/day: 0.00   Average packs/day: 1 pack/day for 30.0 years (30.0 ttl pk-yrs)   Types: Cigarettes   Start date: 05/17/1959   Quit date: 05/16/1989   Years since quitting: 34.7   Passive exposure: Past  Smokeless Tobacco Never    Goals Met:  Independence with exercise equipment Exercise tolerated well No report of concerns or symptoms today Strength training completed today  Goals Unmet:  Not Applicable  Comments: Pt able to follow exercise prescription today without complaint.  Will continue to monitor for progression.    Dr. Oneil Pinal is Medical Director for [facility] Cardiac Rehabilitation.  Dr. Fuad Aleskerov is Medical Director for [facility] Pulmonary Rehabilitation.

## 2024-02-24 ENCOUNTER — Other Ambulatory Visit: Payer: Self-pay | Admitting: Family Medicine

## 2024-02-26 ENCOUNTER — Encounter

## 2024-02-27 ENCOUNTER — Ambulatory Visit: Admitting: Pulmonary Disease

## 2024-02-28 ENCOUNTER — Encounter

## 2024-03-01 ENCOUNTER — Encounter

## 2024-03-01 DIAGNOSIS — I214 Non-ST elevation (NSTEMI) myocardial infarction: Secondary | ICD-10-CM | POA: Diagnosis not present

## 2024-03-01 DIAGNOSIS — I5032 Chronic diastolic (congestive) heart failure: Secondary | ICD-10-CM | POA: Diagnosis not present

## 2024-03-01 NOTE — Progress Notes (Signed)
 Daily Session Note  Patient Details  Name: Lauren Lloyd MRN: [id number] Date of Birth: 12-02-1940 Referring Provider:   Flowsheet Row Cardiac Rehab from 02/14/2024 in [facility] Cardiac and Pulmonary Rehab  Referring Provider Dr. Alm Clay    Encounter Date: 03/01/2024  Check In:  Session Check In - 03/01/24 0918       Check-In   Supervising physician immediately available to respond to emergencies See telemetry face sheet for immediately available ER MD    Location [facility]-Cardiac & Pulmonary Rehab    Staff Present Burnard Davenport RN,BSN,MPA;Maxon Conetta BS, Exercise Physiologist;Noah Tickle, BS, Exercise Physiologist;Laureen Delores, BS, RRT, CPFT    Virtual Visit No    Medication changes reported     No    Fall or balance concerns reported    No    Tobacco Cessation No Change    Warm-up and Cool-down Performed on first and last piece of equipment    Resistance Training Performed Yes    VAD Patient? No    PAD/SET Patient? No      Pain Assessment   Currently in Pain? No/denies             Social History   Tobacco Use  Smoking Status Former   Current packs/day: 0.00   Average packs/day: 1 pack/day for 30.0 years (30.0 ttl pk-yrs)   Types: Cigarettes   Start date: 05/17/1959   Quit date: 05/16/1989   Years since quitting: 34.8   Passive exposure: Past  Smokeless Tobacco Never    Goals Met:  Independence with exercise equipment Exercise tolerated well No report of concerns or symptoms today Strength training completed today  Goals Unmet:  Not Applicable  Comments: Pt able to follow exercise prescription today without complaint.  Will continue to monitor for progression.    Dr. Oneil Pinal is Medical Director for [facility] Cardiac Rehabilitation.  Dr. Fuad Aleskerov is Medical Director for [facility] Pulmonary Rehabilitation.

## 2024-03-04 ENCOUNTER — Encounter

## 2024-03-04 DIAGNOSIS — I5032 Chronic diastolic (congestive) heart failure: Secondary | ICD-10-CM | POA: Diagnosis not present

## 2024-03-04 DIAGNOSIS — I214 Non-ST elevation (NSTEMI) myocardial infarction: Secondary | ICD-10-CM | POA: Diagnosis not present

## 2024-03-04 NOTE — Progress Notes (Signed)
 Daily Session Note  Patient Details  Name: Lauren Lloyd MRN: [id number] Date of Birth: 1941/03/11 Referring Provider:   Flowsheet Row Cardiac Rehab from 02/14/2024 in [facility] Cardiac and Pulmonary Rehab  Referring Provider Dr. Alm Clay    Encounter Date: 03/04/2024  Check In:  Session Check In - 03/04/24 0916       Check-In   Supervising physician immediately available to respond to emergencies See telemetry face sheet for immediately available ER MD    Location [facility]-Cardiac & Pulmonary Rehab    Staff Present Burnard Davenport RN,BSN,MPA;Maxon Burnell BS, Exercise Physiologist;[facility] Dyane BS, ACSM CEP, Exercise Physiologist;Jason Elnor RDN,LDN    Virtual Visit No    Medication changes reported     No    Fall or balance concerns reported    No    Tobacco Cessation No Change    Warm-up and Cool-down Performed on first and last piece of equipment    Resistance Training Performed Yes    VAD Patient? No    PAD/SET Patient? No      Pain Assessment   Currently in Pain? No/denies             Social History   Tobacco Use  Smoking Status Former   Current packs/day: 0.00   Average packs/day: 1 pack/day for 30.0 years (30.0 ttl pk-yrs)   Types: Cigarettes   Start date: 05/17/1959   Quit date: 05/16/1989   Years since quitting: 34.8   Passive exposure: Past  Smokeless Tobacco Never    Goals Met:  Independence with exercise equipment Exercise tolerated well No report of concerns or symptoms today Strength training completed today  Goals Unmet:  Not Applicable  Comments: Pt able to follow exercise prescription today without complaint.  Will continue to monitor for progression.    Dr. Oneil Pinal is Medical Director for [facility] Cardiac Rehabilitation.  Dr. Fuad Aleskerov is Medical Director for [facility] Pulmonary Rehabilitation.

## 2024-03-06 ENCOUNTER — Encounter

## 2024-03-06 ENCOUNTER — Encounter: Payer: Self-pay | Admitting: *Deleted

## 2024-03-06 DIAGNOSIS — I214 Non-ST elevation (NSTEMI) myocardial infarction: Secondary | ICD-10-CM

## 2024-03-06 NOTE — Progress Notes (Signed)
 Cardiac Individual Treatment Plan  Patient Details  Name: Lauren Lloyd MRN: [id number] Date of Birth: 30-Dec-1940 Referring Provider:   Flowsheet Row Cardiac Rehab from 02/14/2024 in [facility] Cardiac and Pulmonary Rehab  Referring Provider Dr. Alm Clay    Initial Encounter Date:  Flowsheet Row Cardiac Rehab from 02/14/2024 in [facility] Cardiac and Pulmonary Rehab  Date 02/14/24    Visit Diagnosis: NSTEMI (non-ST elevated myocardial infarction) [facility])  Patient's Home Medications on Admission:  Current Outpatient Medications:    allopurinol  (ZYLOPRIM ) 100 MG tablet, TAKE ONE TABLET BY MOUTH EVERY DAY, Disp: 90 tablet, Rfl: 1   atorvastatin  (LIPITOR ) 80 MG tablet, TAKE ONE TABLET BY MOUTH DAILY, Disp: 30 tablet, Rfl: 0   calcium  carbonate (TUMS EX) 750 MG chewable tablet, Chew 2 tablets by mouth daily as needed for heartburn., Disp: , Rfl:    Cholecalciferol  25 MCG (1000 UT) tablet, Take 1,000 Units by mouth daily., Disp: , Rfl:    clopidogrel  (PLAVIX ) 75 MG tablet, TAKE 1 TABLET BY MOUTH DAILY, Disp: 30 tablet, Rfl: 0   dapagliflozin  propanediol (FARXIGA ) 10 MG TABS tablet, Take 1 tablet (10 mg total) by mouth daily before breakfast., Disp: 30 tablet, Rfl: 6   ELIQUIS  5 MG TABS tablet, TAKE ONE TABLET TWICE DAILY, Disp: 180 tablet, Rfl: 1   ezetimibe  (ZETIA ) 10 MG tablet, TAKE 1 TABLET BY MOUTH DAILY, Disp: 90 tablet, Rfl: 3   furosemide  (LASIX ) 40 MG tablet, TAKE 1 TABLET BY MOUTH DAILY. TAKE AN EXTRA TABLET AS NEEDED AFTER LUNCH FOR ABDOMINAL SWELLING, LEG SWELLING OR SHORTNESS OF BREATH, Disp: 180 tablet, Rfl: 1   gabapentin  (NEURONTIN ) 600 MG tablet, Take 1 tablet (600 mg total) by mouth at bedtime., Disp: 90 tablet, Rfl: 3   lactobacillus acidophilus (BACID) TABS tablet, Take 1-2 tablets by mouth 2 (two) times daily. 2 AM AND 1 HS, Disp: , Rfl:    Magnesium  300 MG CAPS, Take 2 capsules by mouth daily., Disp: , Rfl:    Menthol, Topical Analgesic, (BIOFREEZE EX), Apply 1 application.  topically daily as needed (Neck pain)., Disp: , Rfl:    metaxalone  (SKELAXIN ) 800 MG tablet, Take 1 tablet (800 mg total) by mouth daily as needed for muscle spasms., Disp: 30 tablet, Rfl: 2   metoprolol  tartrate (LOPRESSOR ) 25 MG tablet, TAKE 0.5 TABLET BY MOUTH 2 TIMES DAILY., Disp: 30 tablet, Rfl: 0   Multiple Vitamin (MULTIVITAMIN) capsule, Take 1 capsule by mouth daily., Disp: , Rfl:    mupirocin  ointment (BACTROBAN ) 2 %, Place 1 application  into the nose 2 (two) times daily., Disp: 22 g, Rfl: 0   nitroGLYCERIN  (NITROSTAT ) 0.4 MG SL tablet, Place 1 tablet (0.4 mg total) under the tongue every 5 (five) minutes as needed for chest pain., Disp: 30 tablet, Rfl: 12   potassium chloride  (KLOR-CON ) 10 MEQ tablet, Take 2 tablets (20 mEq total) by mouth daily., Disp: 60 tablet, Rfl: 10   Tafamidis  (VYNDAMAX ) 61 MG CAPS, Take 1 capsule (61 mg total) by mouth daily., Disp: 30 capsule, Rfl: 11   Tiotropium Bromide -Olodaterol (STIOLTO RESPIMAT ) 2.5-2.5 MCG/ACT AERS, Inhale 2 puffs into the lungs daily. (Patient taking differently: Inhale 2 puffs into the lungs daily as needed.), Disp: 4 g, Rfl: 0  Past Medical History: Past Medical History:  Diagnosis Date   (HFpEF) heart failure with preserved ejection fraction (HCC)    a. 05/2018 Echo: EF 55-60%, no rwma, mild to mod MR. Nl RV fxn. Mod TR. PASP .   Arthritis  knees, Hands   Arthritis of knee    Back pain    Carotid arterial disease    a. 03/2019 Carotid U/S: <50% bilat ICA stenoses.   CHF (congestive heart failure) (HCC)    Cholelithiasis    a. 10/2018 noted on CT.   Edema, lower extremity    Fatty liver    GERD (gastroesophageal reflux disease)    History of stress test    a. 06/2018 MV: EF 59%, no ischemia/infarct. Low risk.   Hypertension    Knee pain    Lactose intolerance    Mitral regurgitation    a. 05/2018 Echo: mild to mod MR.   Multinodular goiter    Obesity    OSA (obstructive sleep apnea)    PAF (paroxysmal atrial  fibrillation) (HCC)    a.  Diagnosed 12/19; b. 05/2018 s/p DCCV; c. 03/2019 & 05/2019 recurrent AFib-->managed w/ amio load; d. CHADS2VASc = 6 (CHF, HTN, age x 2, vascular disease, female)-->Eliquis  & amio 100 qd.   PAH (pulmonary artery hypertension) (HCC)    RSV (acute bronchiolitis due to respiratory syncytial virus) 05/10/2022   Scoliosis    SOB (shortness of breath)    Swallowing difficulty     Tobacco Use: Social History   Tobacco Use  Smoking Status Former   Current packs/day: 0.00   Average packs/day: 1 pack/day for 30.0 years (30.0 ttl pk-yrs)   Types: Cigarettes   Start date: 05/17/1959   Quit date: 05/16/1989   Years since quitting: 34.8   Passive exposure: Past  Smokeless Tobacco Never    Labs: Review Flowsheet  More data exists      Latest Ref Rng & Units 08/16/2021 03/17/2022 10/28/2022 01/11/2024 01/24/2024  Labs for ITP Cardiac and Pulmonary Rehab  Cholestrol 0 - 200 mg/dL CANCELED  837  823  782  189   LDL (calc) 0 - 99 mg/dL - 93  899  875  880   HDL-C >40 mg/dL CANCELED  52  54  71  60   Trlycerides <150 mg/dL CANCELED  94  873  875  49   Hemoglobin A1c 4.8 - 5.6 % - 6.0  6.0  5.5  -     Exercise Target Goals: Exercise Program Goal: Individual exercise prescription set using results from initial 6 min walk test and THRR while considering  patient's activity barriers and safety.   Exercise Prescription Goal: Initial exercise prescription builds to 30-45 minutes a day of aerobic activity, 2-3 days per week.  Home exercise guidelines will be given to patient during program as part of exercise prescription that the participant will acknowledge.   Education: Aerobic Exercise: - Group verbal and visual presentation on the components of exercise prescription. Introduces F.I.T.T principle from ACSM for exercise prescriptions.  Reviews F.I.T.T. principles of aerobic exercise including progression. Written material provided at class time. Flowsheet Row Cardiac Rehab from  02/14/2024 in [facility] Cardiac and Pulmonary Rehab  Education need identified 02/14/24    Education: Resistance Exercise: - Group verbal and visual presentation on the components of exercise prescription. Introduces F.I.T.T principle from ACSM for exercise prescriptions  Reviews F.I.T.T. principles of resistance exercise including progression. Written material provided at class time.    Education: Exercise & Equipment Safety: - Individual verbal instruction and demonstration of equipment use and safety with use of the equipment. Flowsheet Row Cardiac Rehab from 02/14/2024 in [facility] Cardiac and Pulmonary Rehab  Date 02/14/24  Educator [facility]  Instruction Review Code 1- Verbalizes Understanding  Education: Exercise Physiology & General Exercise Guidelines: - Group verbal and written instruction with models to review the exercise physiology of the cardiovascular system and associated critical values. Provides general exercise guidelines with specific guidelines to those with heart or lung disease. Written material provided at class time. Flowsheet Row Cardiac Rehab from 02/14/2024 in [facility] Cardiac and Pulmonary Rehab  Education need identified 02/14/24    Education: Flexibility, Balance, Mind/Body Relaxation: - Group verbal and visual presentation with interactive activity on the components of exercise prescription. Introduces F.I.T.T principle from ACSM for exercise prescriptions. Reviews F.I.T.T. principles of flexibility and balance exercise training including progression. Also discusses the mind body connection.  Reviews various relaxation techniques to help reduce and manage stress (i.e. Deep breathing, progressive muscle relaxation, and visualization). Balance handout provided to take home. Written material provided at class time.   Activity Barriers & Risk Stratification:  Activity Barriers & Cardiac Risk Stratification - 02/14/24 1027       Activity Barriers & Cardiac Risk Stratification    Activity Barriers Arthritis;Back Problems;Balance Concerns    Cardiac Risk Stratification Moderate          6 Minute Walk:  6 Minute Walk     Row Name 02/14/24 1026         6 Minute Walk   Phase Initial     Distance 1180 feet     Walk Time 6 minutes     # of Rest Breaks 0     MPH 2.2     METS 2.1     RPE 13     Perceived Dyspnea  3     VO2 Peak 7.4     Symptoms Yes (comment)     Comments SOB     Resting HR 72 bpm     Resting BP 114/52     Resting Oxygen Saturation  96 %     Exercise Oxygen Saturation  during 6 min walk 95 %     Max Ex. HR 131 bpm     Max Ex. BP 162/68     2 Minute Post BP 126/66        Oxygen Initial Assessment:   Oxygen Re-Evaluation:   Oxygen Discharge (Final Oxygen Re-Evaluation):   Initial Exercise Prescription:  Initial Exercise Prescription - 02/14/24 1000       Date of Initial Exercise RX and Referring Provider   Date 02/14/24    Referring Provider Dr. Alm Clay      Oxygen   Maintain Oxygen Saturation 88% or higher      Treadmill   MPH 2    Grade 0    Minutes 15    METs 2.53      Recumbant Bike   Level 3    RPM 50    Watts 25    Minutes 15    METs 2.1      NuStep   Level 3    SPM 80    Minutes 15    METs 2.1      REL-XR   Level 3    Watts 25    Speed 50    Minutes 15    METs 2.1      T5 Nustep   Level 3    Minutes 15    METs 2.1      Track   Laps 31    Minutes 15    METs 2.69      Prescription Details   Duration Progress to 30  minutes of continuous aerobic without signs/symptoms of physical distress      Intensity   THRR 40-80% of Max Heartrate 97-123    Ratings of Perceived Exertion 11-13    Perceived Dyspnea 0-4      Progression   Progression Continue to progress workloads to maintain intensity without signs/symptoms of physical distress.      Resistance Training   Training Prescription Yes    Weight 4lb    Reps 10-15          Perform Capillary Blood Glucose checks as  needed.  Exercise Prescription Changes:   Exercise Prescription Changes     Row Name 02/14/24 1000             Response to Exercise   Blood Pressure (Admit) 114/52       Blood Pressure (Exercise) 162/68       Blood Pressure (Exit) 126/66       Heart Rate (Admit) 72 bpm       Heart Rate (Exercise) 131 bpm       Heart Rate (Exit) 78 bpm       Oxygen Saturation (Admit) 96 %       Oxygen Saturation (Exercise) 95 %       Oxygen Saturation (Exit) 95 %       Rating of Perceived Exertion (Exercise) 13       Perceived Dyspnea (Exercise) 3       Symptoms none       Comments results          Exercise Comments:   Exercise Comments     Row Name 02/19/24 0921           Exercise Comments First full day of exercise!  Patient was oriented to gym and equipment including functions, settings, policies, and procedures.  Patient's individual exercise prescription and treatment plan were reviewed.  All starting workloads were established based on the results of the 6 minute walk test done at initial orientation visit.  The plan for exercise progression was also introduced and progression will be customized based on patient's performance and goals.          Exercise Goals and Review:   Exercise Goals     Row Name 02/14/24 1031             Exercise Goals   Increase Physical Activity Yes       Intervention Provide advice, education, support and counseling about physical activity/exercise needs.;Develop an individualized exercise prescription for aerobic and resistive training based on initial evaluation findings, risk stratification, comorbidities and participant's personal goals.       Expected Outcomes Short Term: Attend rehab on a regular basis to increase amount of physical activity.;Long Term: Exercising regularly at least 3-5 days a week.;Long Term: Add in home exercise to make exercise part of routine and to increase amount of physical activity.       Increase Strength and  Stamina Yes       Intervention Provide advice, education, support and counseling about physical activity/exercise needs.;Develop an individualized exercise prescription for aerobic and resistive training based on initial evaluation findings, risk stratification, comorbidities and participant's personal goals.       Expected Outcomes Short Term: Increase workloads from initial exercise prescription for resistance, speed, and METs.;Short Term: Perform resistance training exercises routinely during rehab and add in resistance training at home;Long Term: Improve cardiorespiratory fitness, muscular endurance and strength as measured by increased METs and functional capacity ( )  Able to understand and use rate of perceived exertion (RPE) scale Yes       Intervention Provide education and explanation on how to use RPE scale       Expected Outcomes Short Term: Able to use RPE daily in rehab to express subjective intensity level;Long Term:  Able to use RPE to guide intensity level when exercising independently       Able to understand and use Dyspnea scale Yes       Intervention Provide education and explanation on how to use Dyspnea scale       Expected Outcomes Short Term: Able to use Dyspnea scale daily in rehab to express subjective sense of shortness of breath during exertion;Long Term: Able to use Dyspnea scale to guide intensity level when exercising independently       Knowledge and understanding of Target Heart Rate Range (THRR) Yes       Intervention Provide education and explanation of THRR including how the numbers were predicted and where they are located for reference       Expected Outcomes Short Term: Able to state/look up THRR;Long Term: Able to use THRR to govern intensity when exercising independently;Short Term: Able to use daily as guideline for intensity in rehab       Able to check pulse independently Yes       Intervention Provide education and demonstration on how to check pulse  in carotid and radial arteries.;Review the importance of being able to check your own pulse for safety during independent exercise       Expected Outcomes Short Term: Able to explain why pulse checking is important during independent exercise;Long Term: Able to check pulse independently and accurately       Understanding of Exercise Prescription Yes       Intervention Provide education, explanation, and written materials on patient's individual exercise prescription       Expected Outcomes Short Term: Able to explain program exercise prescription;Long Term: Able to explain home exercise prescription to exercise independently          Exercise Goals Re-Evaluation :  Exercise Goals Re-Evaluation     Row Name 02/19/24 0921             Exercise Goal Re-Evaluation   Exercise Goals Review Increase Physical Activity;Able to understand and use rate of perceived exertion (RPE) scale;Knowledge and understanding of Target Heart Rate Range (THRR);Understanding of Exercise Prescription;Increase Strength and Stamina;Able to understand and use Dyspnea scale;Able to check pulse independently       Comments Reviewed RPE and dyspnea scale, THR and program prescription with pt today.  Pt voiced understanding and was given a copy of goals to take home.       Expected Outcomes Short: Use RPE daily to regulate intensity. Long: Follow program prescription in THR.          Discharge Exercise Prescription (Final Exercise Prescription Changes):  Exercise Prescription Changes - 02/14/24 1000       Response to Exercise   Blood Pressure (Admit) 114/52    Blood Pressure (Exercise) 162/68    Blood Pressure (Exit) 126/66    Heart Rate (Admit) 72 bpm    Heart Rate (Exercise) 131 bpm    Heart Rate (Exit) 78 bpm    Oxygen Saturation (Admit) 96 %    Oxygen Saturation (Exercise) 95 %    Oxygen Saturation (Exit) 95 %    Rating of Perceived Exertion (Exercise) 13    Perceived Dyspnea (Exercise) 3  Symptoms none     Comments results          Nutrition:  Target Goals: Understanding of nutrition guidelines, daily intake of sodium 1500mg , cholesterol 200mg , calories 30% from fat and 7% or less from saturated fats, daily to have 5 or more servings of fruits and vegetables.  Education: Nutrition 1 -Group instruction provided by verbal, written material, interactive activities, discussions, models, and posters to present general guidelines for heart healthy nutrition including macronutrients, label reading, and promoting whole foods over processed counterparts. Education serves as Pensions consultant of discussion of heart healthy eating for all. Written material provided at class time.    Education: Nutrition 2 -Group instruction provided by verbal, written material, interactive activities, discussions, models, and posters to present general guidelines for heart healthy nutrition including sodium, cholesterol, and saturated fat. Providing guidance of habit forming to improve blood pressure, cholesterol, and body weight. Written material provided at class time.     Biometrics:  Pre Biometrics - 02/14/24 1032       Pre Biometrics   Height 5' 6.2 (1.681 m)    Weight 215 lb 3.2 oz (97.6 kg)    Waist Circumference 40 inches    Hip Circumference 47 inches    Waist to Hip Ratio 0.85 %    BMI (Calculated) 34.54    Single Leg Stand 3 seconds           Nutrition Therapy Plan and Nutrition Goals:   Nutrition Assessments:  MEDIFICTS Score Key: >=70 Need to make dietary changes  40-70 Heart Healthy Diet <= 40 Therapeutic Level Cholesterol Diet  Flowsheet Row Cardiac Rehab from 02/14/2024 in [facility] Cardiac and Pulmonary Rehab  Picture Your Plate Total Score on Admission 54   Picture Your Plate Scores: <59 Unhealthy dietary pattern with much room for improvement. 41-50 Dietary pattern unlikely to meet recommendations for good health and room for improvement. 51-60 More healthful dietary pattern,  with some room for improvement.  >60 Healthy dietary pattern, although there may be some specific behaviors that could be improved.    Nutrition Goals Re-Evaluation:   Nutrition Goals Discharge (Final Nutrition Goals Re-Evaluation):   Psychosocial: Target Goals: Acknowledge presence or absence of significant depression and/or stress, maximize coping skills, provide positive support system. Participant is able to verbalize types and ability to use techniques and skills needed for reducing stress and depression.   Education: Stress, Anxiety, and Depression - Group verbal and visual presentation to define topics covered.  Reviews how body is impacted by stress, anxiety, and depression.  Also discusses healthy ways to reduce stress and to treat/manage anxiety and depression. Written material provided at class time.   Education: Sleep Hygiene -Provides group verbal and written instruction about how sleep can affect your health.  Define sleep hygiene, discuss sleep cycles and impact of sleep habits. Review good sleep hygiene tips.   Initial Review & Psychosocial Screening:  Initial Psych Review & Screening - 02/07/24 1014       Initial Review   Current issues with None Identified      Family Dynamics   Good Support System? Yes      Barriers   Psychosocial barriers to participate in program The patient should benefit from training in stress management and relaxation.;There are no identifiable barriers or psychosocial needs.      Screening Interventions   Interventions Encouraged to exercise;To provide support and resources with identified psychosocial needs;Provide feedback about the scores to participant    Expected Outcomes Short  Term goal: Utilizing psychosocial counselor, staff and physician to assist with identification of specific Stressors or current issues interfering with healing process. Setting desired goal for each stressor or current issue identified.;Long Term Goal:  Stressors or current issues are controlled or eliminated.;Short Term goal: Identification and review with participant of any Quality of Life or Depression concerns found by scoring the questionnaire.;Long Term goal: The participant improves quality of Life and PHQ9 Scores as seen by post scores and/or verbalization of changes          Quality of Life Scores:   Quality of Life - 02/14/24 1032       Quality of Life   Select Quality of Life      Quality of Life Scores   Health/Function Pre 20.47 %    Socioeconomic Pre 26.25 %    Psych/Spiritual Pre 30 %    Family Pre 21.6 %    GLOBAL Pre 23.86 %         Scores of 19 and below usually indicate a poorer quality of life in these areas.  A difference of  2-3 points is a clinically meaningful difference.  A difference of 2-3 points in the total score of the Quality of Life Index has been associated with significant improvement in overall quality of life, self-image, physical symptoms, and general health in studies assessing change in quality of life.  PHQ-9: Review Flowsheet  More data exists      02/14/2024 01/17/2024 01/11/2024 12/07/2023 11/10/2023  Depression screen PHQ 2/9  Decreased Interest 0 0 0 0 0  Down, Depressed, Hopeless 0 0 0 0 0  PHQ - 2 Score 0 0 0 0 0  Altered sleeping 0 0 1 0 -  Tired, decreased energy 1 0 1 1 -  Change in appetite 0 0 1 0 -  Feeling bad or failure about yourself  0 0 - 0 -  Trouble concentrating 0 0 0 0 -  Moving slowly or fidgety/restless 0 0 0 0 -  Suicidal thoughts 0 0 0 0 -  PHQ-9 Score 1 0 3 1 -  Difficult doing work/chores Not difficult at all Not difficult at all Somewhat difficult Somewhat difficult -   Interpretation of Total Score  Total Score Depression Severity:  1-4 = Minimal depression, 5-9 = Mild depression, 10-14 = Moderate depression, 15-19 = Moderately severe depression, 20-27 = Severe depression   Psychosocial Evaluation and Intervention:  Psychosocial Evaluation - 02/07/24  1026       Psychosocial Evaluation & Interventions   Interventions Encouraged to exercise with the program and follow exercise prescription    Comments Lauren Lloyd is coming to cardiac rehab after a NSTEMI. When asked about stress, she states she is under a normal amount of stress that any body else would be and she feels like she is handling it well. She reports no sleep concerns either. She is ready to start the program.    Expected Outcomes Short: attend cardiac rehab for education and exercise Long: develop and maintain positive self care habits    Continue Psychosocial Services  Follow up required by staff          Psychosocial Re-Evaluation:   Psychosocial Discharge (Final Psychosocial Re-Evaluation):   Vocational Rehabilitation: Provide vocational rehab assistance to qualifying candidates.   Vocational Rehab Evaluation & Intervention:   Education: Education Goals: Education classes will be provided on a variety of topics geared toward better understanding of heart health and risk factor modification. Participant will  state understanding/return demonstration of topics presented as noted by education test scores.  Learning Barriers/Preferences:  Learning Barriers/Preferences - 02/07/24 1026       Learning Barriers/Preferences   Learning Barriers None    Learning Preferences None          General Cardiac Education Topics:  AED/CPR: - Group verbal and written instruction with the use of models to demonstrate the basic use of the AED with the basic ABC's of resuscitation.   Test and Procedures: - Group verbal and visual presentation and models provide information about basic cardiac anatomy and function. Reviews the testing methods done to diagnose heart disease and the outcomes of the test results. Describes the treatment choices: Medical Management, Angioplasty, or Coronary Bypass Surgery for treating various heart conditions including Myocardial Infarction, Angina,  Valve Disease, and Cardiac Arrhythmias. Written material provided at class time.   Medication Safety: - Group verbal and visual instruction to review commonly prescribed medications for heart and lung disease. Reviews the medication, class of the drug, and side effects. Includes the steps to properly store meds and maintain the prescription regimen. Written material provided at class time.   Intimacy: - Group verbal instruction through game format to discuss how heart and lung disease can affect sexual intimacy. Written material provided at class time.   Know Your Numbers and Heart Failure: - Group verbal and visual instruction to discuss disease risk factors for cardiac and pulmonary disease and treatment options.  Reviews associated critical values for Overweight/Obesity, Hypertension, Cholesterol, and Diabetes.  Discusses basics of heart failure: signs/symptoms and treatments.  Introduces Heart Failure Zone chart for action plan for heart failure. Written material provided at class time.   Infection Prevention: - Provides verbal and written material to individual with discussion of infection control including proper hand washing and proper equipment cleaning during exercise session. Flowsheet Row Cardiac Rehab from 02/14/2024 in [facility] Cardiac and Pulmonary Rehab  Date 02/14/24  Educator [facility]  Instruction Review Code 1- Verbalizes Understanding    Falls Prevention: - Provides verbal and written material to individual with discussion of falls prevention and safety. Flowsheet Row Cardiac Rehab from 02/14/2024 in [facility] Cardiac and Pulmonary Rehab  Date 02/14/24  Educator [facility]  Instruction Review Code 1- Verbalizes Understanding    Other: -Provides group and verbal instruction on various topics (see comments)   Knowledge Questionnaire Score:  Knowledge Questionnaire Score - 02/14/24 1033       Knowledge Questionnaire Score   Pre Score 23/26          Core Components/Risk  Factors/Patient Goals at Admission:  Personal Goals and Risk Factors at Admission - 02/07/24 1013       Core Components/Risk Factors/Patient Goals on Admission    Weight Management Yes;Weight Loss    Intervention Weight Management: Develop a combined nutrition and exercise program designed to reach desired caloric intake, while maintaining appropriate intake of nutrient and fiber, sodium and fats, and appropriate energy expenditure required for the weight goal.;Weight Management: Provide education and appropriate resources to help participant work on and attain dietary goals.;Weight Management/Obesity: Establish reasonable short term and long term weight goals.;Obesity: Provide education and appropriate resources to help participant work on and attain dietary goals.    Expected Outcomes Short Term: Continue to assess and modify interventions until short term weight is achieved;Long Term: Adherence to nutrition and physical activity/exercise program aimed toward attainment of established weight goal;Weight Maintenance: Understanding of the daily nutrition guidelines, which includes 25-35% calories from fat, 7% or  less cal from saturated fats, less than 200mg  cholesterol, less than 1.5gm of sodium, & 5 or more servings of fruits and vegetables daily;Weight Loss: Understanding of general recommendations for a balanced deficit meal plan, which promotes 1-2 lb weight loss per week and includes a negative energy balance of [phone] kcal/d;Understanding recommendations for meals to include 15-35% energy as protein, 25-35% energy from fat, 35-60% energy from carbohydrates, less than 200mg  of dietary cholesterol, 20-35 gm of total fiber daily;Understanding of distribution of calorie intake throughout the day with the consumption of 4-5 meals/snacks    Improve shortness of breath with ADL's Yes    Intervention Provide education, individualized exercise plan and daily activity instruction to help decrease symptoms of  SOB with activities of daily living.    Expected Outcomes Short Term: Improve cardiorespiratory fitness to achieve a reduction of symptoms when performing ADLs;Long Term: Be able to perform more ADLs without symptoms or delay the onset of symptoms    Heart Failure Yes    Intervention Provide a combined exercise and nutrition program that is supplemented with education, support and counseling about heart failure. Directed toward relieving symptoms such as shortness of breath, decreased exercise tolerance, and extremity edema.    Expected Outcomes Improve functional capacity of life;Short term: Attendance in program 2-3 days a week with increased exercise capacity. Reported lower sodium intake. Reported increased fruit and vegetable intake. Reports medication compliance.;Short term: Daily weights obtained and reported for increase. Utilizing diuretic protocols set by physician.;Long term: Adoption of self-care skills and reduction of barriers for early signs and symptoms recognition and intervention leading to self-care maintenance.    Hypertension Yes    Intervention Provide education on lifestyle modifcations including regular physical activity/exercise, weight management, moderate sodium restriction and increased consumption of fresh fruit, vegetables, and low fat dairy, alcohol moderation, and smoking cessation.;Monitor prescription use compliance.    Expected Outcomes Short Term: Continued assessment and intervention until BP is < 140/46mm HG in hypertensive participants. < 130/72mm HG in hypertensive participants with diabetes, heart failure or chronic kidney disease.;Long Term: Maintenance of blood pressure at goal levels.    Lipids Yes    Intervention Provide education and support for participant on nutrition & aerobic/resistive exercise along with prescribed medications to achieve LDL 70mg , HDL >40mg .    Expected Outcomes Short Term: Participant states understanding of desired cholesterol values  and is compliant with medications prescribed. Participant is following exercise prescription and nutrition guidelines.;Long Term: Cholesterol controlled with medications as prescribed, with individualized exercise RX and with personalized nutrition plan. Value goals: LDL < 70mg , HDL > 40 mg.          Education:Diabetes - Individual verbal and written instruction to review signs/symptoms of diabetes, desired ranges of glucose level fasting, after meals and with exercise. Acknowledge that pre and post exercise glucose checks will be done for 3 sessions at entry of program.   Core Components/Risk Factors/Patient Goals Review:    Core Components/Risk Factors/Patient Goals at Discharge (Final Review):    ITP Comments:  ITP Comments     Row Name 02/07/24 1019 02/14/24 1026 02/19/24 0921 03/06/24 1002     ITP Comments Initial phone call completed. Diagnosis can be found in [facility] 9/9. EP Orientation scheduled for Wednesday 10/1 at 9am. Completed and gym orientation for cardiac rehab. Initial ITP created and sent for review to Dr. Oneil Pinal, Medical Director. First full day of exercise!  Patient was oriented to gym and equipment including functions, settings, policies, and procedures.  Patient's individual exercise prescription and treatment plan were reviewed.  All starting workloads were established based on the results of the 6 minute walk test done at initial orientation visit.  The plan for exercise progression was also introduced and progression will be customized based on patient's performance and goals. 30 Day review completed. Medical Director ITP review done, changes made as directed, and signed approval by Medical Director.       Comments: 30 day review

## 2024-03-08 ENCOUNTER — Encounter: Admitting: Emergency Medicine

## 2024-03-08 DIAGNOSIS — I5032 Chronic diastolic (congestive) heart failure: Secondary | ICD-10-CM | POA: Diagnosis not present

## 2024-03-08 DIAGNOSIS — I214 Non-ST elevation (NSTEMI) myocardial infarction: Secondary | ICD-10-CM

## 2024-03-08 NOTE — Progress Notes (Signed)
 Daily Session Note  Patient Details  Name: RONIYAH LLORENS MRN: [id number] Date of Birth: 1940/11/29 Referring Provider:   Flowsheet Row Cardiac Rehab from 02/14/2024 in [facility] Cardiac and Pulmonary Rehab  Referring Provider Dr. Alm Clay    Encounter Date: 03/08/2024  Check In:  Session Check In - 03/08/24 9077       Check-In   Supervising physician immediately available to respond to emergencies See telemetry face sheet for immediately available ER MD    Location [facility]-Cardiac & Pulmonary Rehab    Staff Present Devaughn Jaeger, BS, Exercise Physiologist;Ajene Carchi RN,BSN;Maxon [place] BS, Exercise Physiologist;Joseph Rolinda RCP,RRT,BSRT    Virtual Visit No    Medication changes reported     No    Fall or balance concerns reported    No    Tobacco Cessation No Change    Warm-up and Cool-down Performed on first and last piece of equipment    Resistance Training Performed Yes    VAD Patient? No    PAD/SET Patient? No      Pain Assessment   Currently in Pain? No/denies             Social History   Tobacco Use  Smoking Status Former   Current packs/day: 0.00   Average packs/day: 1 pack/day for 30.0 years (30.0 ttl pk-yrs)   Types: Cigarettes   Start date: 05/17/1959   Quit date: 05/16/1989   Years since quitting: 34.8   Passive exposure: Past  Smokeless Tobacco Never    Goals Met:  Independence with exercise equipment Exercise tolerated well No report of concerns or symptoms today Strength training completed today  Goals Unmet:  Not Applicable  Comments: Pt able to follow exercise prescription today without complaint.  Will continue to monitor for progression.    Dr. Oneil Pinal is Medical Director for [facility] Cardiac Rehabilitation.  Dr. Fuad Aleskerov is Medical Director for [facility] Pulmonary Rehabilitation.

## 2024-03-11 ENCOUNTER — Encounter

## 2024-03-11 DIAGNOSIS — I214 Non-ST elevation (NSTEMI) myocardial infarction: Secondary | ICD-10-CM | POA: Diagnosis not present

## 2024-03-11 DIAGNOSIS — I5032 Chronic diastolic (congestive) heart failure: Secondary | ICD-10-CM | POA: Diagnosis not present

## 2024-03-11 NOTE — Progress Notes (Signed)
 Daily Session Note  Patient Details  Name: Lauren Lloyd MRN: [id number] Date of Birth: 09-21-1940 Referring Provider:   Flowsheet Row Cardiac Rehab from 02/14/2024 in [facility] Cardiac and Pulmonary Rehab  Referring Provider Dr. Alm Clay    Encounter Date: 03/11/2024  Check In:  Session Check In - 03/11/24 0940       Check-In   Supervising physician immediately available to respond to emergencies See telemetry face sheet for immediately available ER MD    Location [facility]-Cardiac & Pulmonary Rehab    Staff Present Burnard Davenport RN,BSN,MPA;Maxon Burnell BS, Exercise Physiologist;Joseph Rolinda RCP,RRT,BSRT;Laura Cates RN,BSN;Elveria La[place]augh [place] BS, ACSM CEP, Exercise Physiologist    Virtual Visit No    Medication changes reported     No    Fall or balance concerns reported    No    Tobacco Cessation No Change    Warm-up and Cool-down Performed on first and last piece of equipment    Resistance Training Performed Yes    VAD Patient? No    PAD/SET Patient? No      Pain Assessment   Currently in Pain? No/denies             Social History   Tobacco Use  Smoking Status Former   Current packs/day: 0.00   Average packs/day: 1 pack/day for 30.0 years (30.0 ttl pk-yrs)   Types: Cigarettes   Start date: 05/17/1959   Quit date: 05/16/1989   Years since quitting: 34.8   Passive exposure: Past  Smokeless Tobacco Never    Goals Met:  Independence with exercise equipment Exercise tolerated well No report of concerns or symptoms today Strength training completed today  Goals Unmet:  Not Applicable  Comments: Pt able to follow exercise prescription today without complaint.  Will continue to monitor for progression.    Dr. Oneil Pinal is Medical Director for [facility] Cardiac Rehabilitation.  Dr. Fuad Aleskerov is Medical Director for [facility] Pulmonary Rehabilitation.

## 2024-03-13 ENCOUNTER — Other Ambulatory Visit: Payer: Self-pay

## 2024-03-13 ENCOUNTER — Encounter: Admitting: Emergency Medicine

## 2024-03-13 DIAGNOSIS — I5032 Chronic diastolic (congestive) heart failure: Secondary | ICD-10-CM | POA: Diagnosis not present

## 2024-03-13 DIAGNOSIS — I214 Non-ST elevation (NSTEMI) myocardial infarction: Secondary | ICD-10-CM

## 2024-03-13 NOTE — Progress Notes (Signed)
 Daily Session Note  Patient Details  Name: CHARRIE MCCONNON MRN: [id number] Date of Birth: 1940/06/13 Referring Provider:   Flowsheet Row Cardiac Rehab from 02/14/2024 in [facility] Cardiac and Pulmonary Rehab  Referring Provider Dr. Alm Clay    Encounter Date: 03/13/2024  Check In:  Session Check In - 03/13/24 0935       Check-In   Supervising physician immediately available to respond to emergencies See telemetry face sheet for immediately available ER MD    Location [facility]-Cardiac & Pulmonary Rehab    Staff Present Devaughn Jaeger, BS, Exercise Physiologist;Margaret Best, MS, Exercise Physiologist;Fredrico Beedle RN,BSN;Joseph Rolinda RCP,RRT,BSRT    Virtual Visit No    Medication changes reported     No    Fall or balance concerns reported    No    Tobacco Cessation No Change    Warm-up and Cool-down Performed on first and last piece of equipment    Resistance Training Performed Yes    VAD Patient? No    PAD/SET Patient? No      Pain Assessment   Currently in Pain? No/denies             Social History   Tobacco Use  Smoking Status Former   Current packs/day: 0.00   Average packs/day: 1 pack/day for 30.0 years (30.0 ttl pk-yrs)   Types: Cigarettes   Start date: 05/17/1959   Quit date: 05/16/1989   Years since quitting: 34.8   Passive exposure: Past  Smokeless Tobacco Never    Goals Met:  Independence with exercise equipment Exercise tolerated well No report of concerns or symptoms today Strength training completed today  Goals Unmet:  Not Applicable  Comments: Pt able to follow exercise prescription today without complaint.  Will continue to monitor for progression.    Dr. Oneil Pinal is Medical Director for [facility] Cardiac Rehabilitation.  Dr. Fuad Aleskerov is Medical Director for [facility] Pulmonary Rehabilitation.

## 2024-03-15 ENCOUNTER — Other Ambulatory Visit: Payer: Self-pay

## 2024-03-15 ENCOUNTER — Encounter: Admitting: Emergency Medicine

## 2024-03-15 DIAGNOSIS — I214 Non-ST elevation (NSTEMI) myocardial infarction: Secondary | ICD-10-CM

## 2024-03-15 DIAGNOSIS — I5032 Chronic diastolic (congestive) heart failure: Secondary | ICD-10-CM | POA: Diagnosis not present

## 2024-03-15 NOTE — Progress Notes (Signed)
 Daily Session Note  Patient Details  Name: Lauren Lloyd MRN: [id number] Date of Birth: 07-21-1940 Referring Provider:   Flowsheet Row Cardiac Rehab from 02/14/2024 in [facility] Cardiac and Pulmonary Rehab  Referring Provider Dr. Alm Clay    Encounter Date: 03/15/2024  Check In:  Session Check In - 03/15/24 0927       Check-In   Supervising physician immediately available to respond to emergencies See telemetry face sheet for immediately available ER MD    Location [facility]-Cardiac & Pulmonary Rehab    Staff Present Devaughn Jaeger, BS, Exercise Physiologist;Javyon Fontan RN,BSN;Joseph Hood RCP,RRT,BSRT;Maxon Benavides BS, Exercise Physiologist    Virtual Visit No    Medication changes reported     No    Fall or balance concerns reported    No    Tobacco Cessation No Change    Warm-up and Cool-down Performed on first and last piece of equipment    Resistance Training Performed Yes    VAD Patient? No    PAD/SET Patient? No      Pain Assessment   Currently in Pain? No/denies             Social History   Tobacco Use  Smoking Status Former   Current packs/day: 0.00   Average packs/day: 1 pack/day for 30.0 years (30.0 ttl pk-yrs)   Types: Cigarettes   Start date: 05/17/1959   Quit date: 05/16/1989   Years since quitting: 34.8   Passive exposure: Past  Smokeless Tobacco Never    Goals Met:  Independence with exercise equipment Exercise tolerated well No report of concerns or symptoms today Strength training completed today  Goals Unmet:  Not Applicable  Comments: Pt able to follow exercise prescription today without complaint.  Will continue to monitor for progression.    Dr. Oneil Pinal is Medical Director for [facility] Cardiac Rehabilitation.  Dr. Fuad Aleskerov is Medical Director for [facility] Pulmonary Rehabilitation.

## 2024-03-18 ENCOUNTER — Other Ambulatory Visit: Payer: Self-pay

## 2024-03-18 ENCOUNTER — Encounter: Attending: Cardiology | Admitting: *Deleted

## 2024-03-18 ENCOUNTER — Other Ambulatory Visit (HOSPITAL_COMMUNITY): Payer: Self-pay

## 2024-03-18 DIAGNOSIS — I5032 Chronic diastolic (congestive) heart failure: Secondary | ICD-10-CM | POA: Insufficient documentation

## 2024-03-18 DIAGNOSIS — I214 Non-ST elevation (NSTEMI) myocardial infarction: Secondary | ICD-10-CM | POA: Insufficient documentation

## 2024-03-18 DIAGNOSIS — K529 Noninfective gastroenteritis and colitis, unspecified: Secondary | ICD-10-CM | POA: Diagnosis not present

## 2024-03-18 DIAGNOSIS — Z8719 Personal history of other diseases of the digestive system: Secondary | ICD-10-CM | POA: Diagnosis not present

## 2024-03-18 NOTE — Progress Notes (Signed)
 Daily Session Note  Patient Details  Name: Lauren Lloyd MRN: [id number] Date of Birth: 1941-03-15 Referring Provider:   Flowsheet Row Cardiac Rehab from 02/14/2024 in [facility] Cardiac and Pulmonary Rehab  Referring Provider Dr. Alm Clay    Encounter Date: 03/18/2024  Check In:  Session Check In - 03/18/24 0932       Check-In   Supervising physician immediately available to respond to emergencies See telemetry face sheet for immediately available ER MD    Location [facility]-Cardiac & Pulmonary Rehab    Staff Present Othel Durand, RN, BSN, CCRP;Laura Cates RN,BSN;Joseph [place] BS, ACSM CEP, Exercise Physiologist    Virtual Visit No    Medication changes reported     No    Fall or balance concerns reported    No    Warm-up and Cool-down Performed on first and last piece of equipment    Resistance Training Performed Yes    VAD Patient? No    PAD/SET Patient? No      Pain Assessment   Currently in Pain? No/denies             Social History   Tobacco Use  Smoking Status Former   Current packs/day: 0.00   Average packs/day: 1 pack/day for 30.0 years (30.0 ttl pk-yrs)   Types: Cigarettes   Start date: 05/17/1959   Quit date: 05/16/1989   Years since quitting: 34.8   Passive exposure: Past  Smokeless Tobacco Never    Goals Met:  Independence with exercise equipment Exercise tolerated well No report of concerns or symptoms today  Goals Unmet:  Not Applicable  Comments: Pt able to follow exercise prescription today without complaint.  Will continue to monitor for progression.    Dr. Oneil Pinal is Medical Director for [facility] Cardiac Rehabilitation.  Dr. Fuad Aleskerov is Medical Director for [facility] Pulmonary Rehabilitation.

## 2024-03-18 NOTE — Progress Notes (Signed)
 Specialty Pharmacy Refill Coordination Note  Spoke with Sundee Garland is a 83 y.o. female contacted today regarding refills of specialty medication(s) Tafamidis  (Vyndamax )  Doses on hand: 20  Patient requested: Delivery   Delivery date: 04/02/24   Verified address: [address] [place] [address]  Medication will be filled on 04/01/24 .

## 2024-03-18 NOTE — Progress Notes (Signed)
 Clinical Intervention Note  Clinical Intervention Notes: Patient reports missing 8 doses due to being away from home and not taking medication with her. Patient has since resumed medication and taking appropriately   Clinical Intervention Outcomes: Improved therapy adherence   [facility] Specialty Pharmacist

## 2024-03-20 ENCOUNTER — Encounter: Admitting: Emergency Medicine

## 2024-03-20 DIAGNOSIS — I214 Non-ST elevation (NSTEMI) myocardial infarction: Secondary | ICD-10-CM

## 2024-03-20 DIAGNOSIS — I5032 Chronic diastolic (congestive) heart failure: Secondary | ICD-10-CM | POA: Diagnosis not present

## 2024-03-20 NOTE — Progress Notes (Signed)
 Daily Session Note  Patient Details  Name: Lauren Lloyd MRN: [id number] Date of Birth: [birth year] Referring Provider:   Flowsheet Row Cardiac Rehab from 02/14/2024 in [facility] Cardiac and Pulmonary Rehab  Referring Provider Dr. Alm Clay    Encounter Date: 03/20/2024  Check In:  Session Check In - 03/20/24 0927       Check-In   Supervising physician immediately available to respond to emergencies See telemetry face sheet for immediately available ER MD    Location [facility]-Cardiac & Pulmonary Rehab    Staff Present Rollene Paterson, MS, Exercise Physiologist;Sejla Marzano RN,BSN;Maxon [place] BS, Exercise Physiologist;Joseph Rolinda RCP,RRT,BSRT    Virtual Visit No    Medication changes reported     No    Fall or balance concerns reported    No    Tobacco Cessation No Change    Warm-up and Cool-down Performed on first and last piece of equipment    Resistance Training Performed Yes    VAD Patient? No    PAD/SET Patient? No      Pain Assessment   Currently in Pain? No/denies             Social History   Tobacco Use  Smoking Status Former   Current packs/day: 0.00   Average packs/day: 1 pack/day for 30.0 years (30.0 ttl pk-yrs)   Types: Cigarettes   Start date: 05/17/1959   Quit date: 05/16/1989   Years since quitting: 34.8   Passive exposure: Past  Smokeless Tobacco Never    Goals Met:  Independence with exercise equipment Exercise tolerated well No report of concerns or symptoms today Strength training completed today  Goals Unmet:  Not Applicable  Comments: Pt able to follow exercise prescription today without complaint.  Will continue to monitor for progression.    Dr. Oneil Pinal is Medical Director for [facility] Cardiac Rehabilitation.  Dr. Fuad Aleskerov is Medical Director for [facility] Pulmonary Rehabilitation.

## 2024-03-22 ENCOUNTER — Other Ambulatory Visit: Payer: Self-pay

## 2024-03-22 ENCOUNTER — Encounter

## 2024-03-25 ENCOUNTER — Encounter

## 2024-03-25 DIAGNOSIS — I214 Non-ST elevation (NSTEMI) myocardial infarction: Secondary | ICD-10-CM

## 2024-03-25 DIAGNOSIS — I5032 Chronic diastolic (congestive) heart failure: Secondary | ICD-10-CM

## 2024-03-25 NOTE — Progress Notes (Signed)
 Daily Session Note  Patient Details  Name: Lauren Lloyd MRN: [id number] Date of Birth: Jul 18, 1940 Referring Provider:   Flowsheet Row Cardiac Rehab from 02/14/2024 in [facility] Cardiac and Pulmonary Rehab  Referring Provider Dr. Alm Clay    Encounter Date: 03/25/2024  Check In:  Session Check In - 03/25/24 0916       Check-In   Supervising physician immediately available to respond to emergencies See telemetry face sheet for immediately available ER MD    Location [facility]-Cardiac & Pulmonary Rehab    Staff Present Burnard Hint BS, ACSM CEP, Exercise Physiologist;Maxon Burnell HECKLE, Exercise Physiologist;Joseph Hood RCP,RRT,BSRT;Blakeley Margraf RN,BSN,MPA;Mary Godley, RN, DNP, NE-BC    Virtual Visit No    Medication changes reported     No    Fall or balance concerns reported    No    Tobacco Cessation No Change    Warm-up and Cool-down Performed on first and last piece of equipment    Resistance Training Performed Yes    VAD Patient? No    PAD/SET Patient? No      Pain Assessment   Currently in Pain? No/denies             Social History   Tobacco Use  Smoking Status Former   Current packs/day: 0.00   Average packs/day: 1 pack/day for 30.0 years (30.0 ttl pk-yrs)   Types: Cigarettes   Start date: 05/17/1959   Quit date: 05/16/1989   Years since quitting: 34.8   Passive exposure: Past  Smokeless Tobacco Never    Goals Met:  Independence with exercise equipment Exercise tolerated well No report of concerns or symptoms today Strength training completed today  Goals Unmet:  Not Applicable  Comments: Pt able to follow exercise prescription today without complaint.  Will continue to monitor for progression.    Dr. Oneil Pinal is Medical Director for [facility] Cardiac Rehabilitation.  Dr. Fuad Aleskerov is Medical Director for [facility] Pulmonary Rehabilitation.

## 2024-03-27 ENCOUNTER — Encounter

## 2024-03-27 ENCOUNTER — Ambulatory Visit: Admitting: Pulmonary Disease

## 2024-03-27 ENCOUNTER — Encounter: Payer: Self-pay | Admitting: Pulmonary Disease

## 2024-03-27 VITALS — BP 115/62 | HR 70 | Ht 67.5 in | Wt 217.0 lb

## 2024-03-27 DIAGNOSIS — R0609 Other forms of dyspnea: Secondary | ICD-10-CM | POA: Diagnosis not present

## 2024-03-27 DIAGNOSIS — J432 Centrilobular emphysema: Secondary | ICD-10-CM

## 2024-03-27 DIAGNOSIS — Z950 Presence of cardiac pacemaker: Secondary | ICD-10-CM

## 2024-03-27 MED ORDER — STIOLTO RESPIMAT 2.5-2.5 MCG/ACT IN AERS: 2.0000 | INHALATION_SPRAY | Freq: Every day | RESPIRATORY_TRACT | 4 refills | Status: AC | PRN

## 2024-03-27 NOTE — Progress Notes (Signed)
 @Patient  ID: Lauren Lloyd, female    DOB: October 24, 1940, 83 y.o.   MRN: [id number]  Chief Complaint  Patient presents with   Medical Management of Chronic Issues    Pt states INH     Referring provider: Myrla Jon HERO, MD  HPI:   83 y.o. woman whom we are seeing for evaluation of dyspnea exertion related to combination of cardiac and pulmonary factors, particularly emphysema.  Recent cardiology note reviewed.  Most recent PCP note reviewed.  Returns for routine follow-up.  Breathing about the same.  Use Stiolto intermittently.  As needed.  Also run out and forget to pick up new ones.  That usually when she does not use it.  She does notice an improvement when using Stiolto.  HPI at initial visit: Patient notes dyspnea for some time now months to years.  Worse on inclines or stairs.  Present on flat surfaces well but not as quickly.  No time of day when things are better or worse.  Improves with rest but otherwise no position to make things better or worse.  No seasonal or environmental factors she can identify to make things better or worse.  No alleviating or otherwise exacerbating factors noted.  She thinks a lot of this started when she went to atrial fibrillation in the past.  This when she first notes in her timeline issues with dyspnea.  She does have a cough.  Brings up light yellow sputum.  Reviewed serial echocardiograms in the past revealed diastolic dysfunction, and chronically elevated estimated PASP similar in readings over the last 3-4 dating back to 2016 at the most remote TTE that I can review results of.  Her most recent chest imaging, cross-sectional imaging, 02/2021 reveals clear lungs with some mild bronchial thickening in the bases on my review and interpretation.  Questionaires / Pulmonary Flowsheets:   ACT:      No data to display          MMRC:     No data to display          Epworth:     12/14/2016    9:00 AM  Results of the Epworth  flowsheet  Sitting and reading 1  Watching TV 1  Sitting, inactive in a public place (e.g. a theatre or a meeting) 0  As a passenger in a car for an hour without a break 1  Lying down to rest in the afternoon when circumstances permit 1  Sitting and talking to someone 0  Sitting quietly after a lunch without alcohol 0  In a car, while stopped for a few minutes in traffic 0  Total score 4    Tests:   FENO:  No results found for: NITRICOXIDE  PFT:    Latest Ref Rng & Units 01/09/2023    2:14 PM  PFT Results  FVC-Pre L 1.71   FVC-Predicted Pre % 59   FVC-Post L 1.69   FVC-Predicted Post % 59   Pre FEV1/FVC % % 75   Post FEV1/FCV % % 80   FEV1-Pre L 1.28   FEV1-Predicted Pre % 60   FEV1-Post L 1.35   DLCO uncorrected ml/min/mmHg 15.74   DLCO UNC% % 76   DLCO corrected ml/min/mmHg 15.74   DLCO COR %Predicted % 76   DLVA Predicted % 120   TLC L 4.03   TLC % Predicted % 73   RV % Predicted % 89   Personally viewed interpreted as no fixed obstruction, suggestive  of moderate restriction versus air trapping, lung volumes within normal notes, DLCO within normal limits.  WALK:     08/03/2023    3:58 PM 02/13/2023    4:38 PM  SIX MIN WALK  2 Minute Oxygen Saturation % 96 % 92 %  2 Minute HR 107 107  4 Minute Oxygen Saturation % 94 % 91 %  4 Minute HR 126 111  6 Minute Oxygen Saturation % 96 % 93 %  6 Minute HR 133 83    Imaging: Personally reviewed and as per EMR No results found.  Lab Results: Personally reviewed CBC    Component Value Date/Time   WBC 8.1 02/05/2024 1448   RBC 4.78 02/05/2024 1448   HGB 15.1 (H) 02/05/2024 1448   HGB 14.4 11/02/2023 1135   HGB 13.6 05/24/2023 1448   HCT 44.9 02/05/2024 1448   HCT 41.1 05/24/2023 1448   PLT 288 02/05/2024 1448   PLT 224 11/02/2023 1135   PLT 233 05/24/2023 1448   MCV 93.9 02/05/2024 1448   MCV 94 05/24/2023 1448   MCH 31.6 02/05/2024 1448   MCHC 33.6 02/05/2024 1448   RDW 13.4 02/05/2024 1448   RDW  12.1 05/24/2023 1448   LYMPHSABS 2.2 05/24/2023 1448   MONOABS 1.3 (H) 05/10/2022 1255   EOSABS 0.2 05/24/2023 1448   BASOSABS 0.1 05/24/2023 1448    BMET    Component Value Date/Time   NA 139 02/05/2024 1448   NA 144 05/24/2023 1448   K 3.4 (L) 02/05/2024 1448   CL 103 02/05/2024 1448   CO2 24 02/05/2024 1448   GLUCOSE 89 02/05/2024 1448   BUN 25 (H) 02/05/2024 1448   BUN 28 (H) 05/24/2023 1448   CREATININE 1.08 (H) 02/05/2024 1448   CREATININE 0.82 11/02/2023 1135   CREATININE 0.78 01/19/2017 1522   CALCIUM  8.7 (L) 02/05/2024 1448   GFRNONAA 51 (L) 02/05/2024 1448   GFRNONAA >60 11/02/2023 1135   GFRNONAA 73 01/19/2017 1522   GFRAA 75 12/09/2019 1411   GFRAA 85 01/19/2017 1522    BNP    Component Value Date/Time   BNP 746.2 (H) 05/12/2022 0453    ProBNP    Component Value Date/Time   PROBNP 2,017 (H) 08/04/2022 1107    Specialty Problems       Pulmonary Problems   OSA (obstructive sleep apnea)   Centrilobular emphysema (HCC)   Dyspnea on exertion   Nasal congestion   Chronic obstructive pulmonary disease (HCC)    Allergies  Allergen Reactions   Levofloxacin Other (See Comments)    Other reaction(s): Joint Pains   Influenza Vaccine Recombinant Other (See Comments)   Influenza Vaccines Other (See Comments)    Bell's Palsy   Oysters [Shellfish Allergy] Swelling    She states she had eaten them three days in a row and she developed swelling around her eyes.     Immunization History  Administered Date(s) Administered    sv, Bivalent, Protein Subunit Rsvpref,pf Marlow) 05/03/2023   Hep A, Unspecified 11/25/1999, 09/19/2001   Hepatitis A 11/25/1999, 09/19/2001   Hepatitis A, Adult 11/25/1999, 09/19/2001   IPV 02/11/2000   PFIZER(Purple Top)SARS-COV-2 Vaccination 06/14/2019, 07/05/2019, 04/15/2020   Pneumococcal Conjugate-13 10/06/2014   Pneumococcal Polysaccharide-23 12/01/2010   Polio, Unspecified 02/11/2000   Respiratory Syncytial Virus  Vaccine,Recomb Aduvanted(Arexvy) 05/03/2023   Td 02/02/1998   Tdap 09/11/2007, 02/27/2012, 01/11/2024   Typhoid Inactivated 02/11/2000   Zoster Recombinant(Shingrix) 02/06/2020, 05/29/2020    Past Medical History:  Diagnosis Date   (  HFpEF) heart failure with preserved ejection fraction (HCC)    a. 05/2018 Echo: EF 55-60%, no rwma, mild to mod MR. Nl RV fxn. Mod TR. PASP .   Arthritis    knees, Hands   Arthritis of knee    Back pain    Carotid arterial disease    a. 03/2019 Carotid U/S: <50% bilat ICA stenoses.   CHF (congestive heart failure) (HCC)    Cholelithiasis    a. 10/2018 noted on CT.   Edema, lower extremity    Fatty liver    GERD (gastroesophageal reflux disease)    History of stress test    a. 06/2018 MV: EF 59%, no ischemia/infarct. Low risk.   Hypertension    Knee pain    Lactose intolerance    Mitral regurgitation    a. 05/2018 Echo: mild to mod MR.   Multinodular goiter    Obesity    OSA (obstructive sleep apnea)    PAF (paroxysmal atrial fibrillation) (HCC)    a.  Diagnosed 12/19; b. 05/2018 s/p DCCV; c. 03/2019 & 05/2019 recurrent AFib-->managed w/ amio load; d. CHADS2VASc = 6 (CHF, HTN, age x 2, vascular disease, female)-->Eliquis  & amio 100 qd.   PAH (pulmonary artery hypertension) (HCC)    RSV (acute bronchiolitis due to respiratory syncytial virus) 05/10/2022   Scoliosis    SOB (shortness of breath)    Swallowing difficulty     Tobacco History: Social History   Tobacco Use  Smoking Status Former   Current packs/day: 0.00   Average packs/day: 1 pack/day for 30.0 years (30.0 ttl pk-yrs)   Types: Cigarettes   Start date: 05/17/1959   Quit date: 05/16/1989   Years since quitting: 34.8   Passive exposure: Past  Smokeless Tobacco Never   Counseling given: Not Answered   Continue to not smoke  Outpatient Encounter Medications as of 03/27/2024  Medication Sig   allopurinol  (ZYLOPRIM ) 100 MG tablet TAKE ONE TABLET BY MOUTH EVERY DAY    atorvastatin  (LIPITOR ) 80 MG tablet TAKE ONE TABLET BY MOUTH DAILY   calcium  carbonate (TUMS EX) 750 MG chewable tablet Chew 2 tablets by mouth daily as needed for heartburn.   Cholecalciferol  25 MCG (1000 UT) tablet Take 1,000 Units by mouth daily.   cholestyramine (QUESTRAN) 4 g packet Take 4 g by mouth.   clopidogrel  (PLAVIX ) 75 MG tablet TAKE 1 TABLET BY MOUTH DAILY   dapagliflozin  propanediol (FARXIGA ) 10 MG TABS tablet Take 1 tablet (10 mg total) by mouth daily before breakfast.   ELIQUIS  5 MG TABS tablet TAKE ONE TABLET TWICE DAILY   ezetimibe  (ZETIA ) 10 MG tablet TAKE 1 TABLET BY MOUTH DAILY   furosemide  (LASIX ) 40 MG tablet TAKE 1 TABLET BY MOUTH DAILY. TAKE AN EXTRA TABLET AS NEEDED AFTER LUNCH FOR ABDOMINAL SWELLING, LEG SWELLING OR SHORTNESS OF BREATH   gabapentin  (NEURONTIN ) 600 MG tablet Take 1 tablet (600 mg total) by mouth at bedtime.   lactobacillus acidophilus (BACID) TABS tablet Take 1-2 tablets by mouth 2 (two) times daily. 2 AM AND 1 HS   Magnesium  300 MG CAPS Take 2 capsules by mouth daily.   Menthol, Topical Analgesic, (BIOFREEZE EX) Apply 1 application. topically daily as needed (Neck pain).   metaxalone  (SKELAXIN ) 800 MG tablet Take 1 tablet (800 mg total) by mouth daily as needed for muscle spasms.   metoprolol  tartrate (LOPRESSOR ) 25 MG tablet TAKE 0.5 TABLET BY MOUTH 2 TIMES DAILY.   Multiple Vitamin (MULTIVITAMIN) capsule Take 1 capsule by mouth  daily.   mupirocin  ointment (BACTROBAN ) 2 % Place 1 application  into the nose 2 (two) times daily.   nitroGLYCERIN  (NITROSTAT ) 0.4 MG SL tablet Place 1 tablet (0.4 mg total) under the tongue every 5 (five) minutes as needed for chest pain.   potassium chloride  (KLOR-CON ) 10 MEQ tablet Take 2 tablets (20 mEq total) by mouth daily.   Tafamidis  (VYNDAMAX ) 61 MG CAPS Take 1 capsule (61 mg total) by mouth daily.   [DISCONTINUED] Tiotropium Bromide -Olodaterol (STIOLTO RESPIMAT ) 2.5-2.5 MCG/ACT AERS Inhale 2 puffs into the lungs  daily. (Patient taking differently: Inhale 2 puffs into the lungs daily as needed.)   Tiotropium Bromide -Olodaterol (STIOLTO RESPIMAT ) 2.5-2.5 MCG/ACT AERS Inhale 2 puffs into the lungs daily as needed.   No facility-administered encounter medications on file as of 03/27/2024.     Review of Systems  Review of Systems  N/a Physical Exam  BP 115/62   Pulse 70   Ht 5' 7.5 (1.715 m) Comment: per pt  Wt 217 lb (98.4 kg)   SpO2 94%   BMI 33.49 kg/m   Wt Readings from Last 5 Encounters:  03/27/24 217 lb (98.4 kg)  02/14/24 215 lb 3.2 oz (97.6 kg)  02/05/24 214 lb (97.1 kg)  01/23/24 215 lb (97.5 kg)  01/22/24 215 lb (97.5 kg)    BMI Readings from Last 5 Encounters:  03/27/24 33.49 kg/m  02/14/24 34.52 kg/m  02/05/24 35.07 kg/m  02/05/24 33.67 kg/m  01/23/24 33.67 kg/m     Physical Exam General: Sitting up, in no distress Eyes: No icterus  Neck: No JVP Pulmonary: Clear, normal work of breathing, breath sounds a bit distant Cardiovascular: Regular rate and rhythm, no murmur Abdomen: Nondistended   Assessment & Plan:   Dyspnea on exertion: Suspect this is multifactorial.  Review of multiple pacemaker checks indicates she is V paced 100% of the time with threshold of 70 bpm.  Do wonder if her heart rate is unable to go above this due to tachycardia bradycardia now permanent bradycardia syndrome and as such she does not have the compensatory increase in cardiac output related to inability to increase heart rate.  Basically, chronotropic insufficiency.  She has history of elevated PASP dating back to 2016, likely group 2 in nature given her A-fib, diastolic dysfunction etc.  Overall stable over time but certainly pulmonary hypertension can cause dyspnea.  Suspect element of deconditioning, habitus as well.  She has some mild bronchial wall thickening on CT scan and some cough which could certainly implicate possible asthma.  PFTs without fixed obstruction.  Very low ERV with  mild restriction likely to habitus, DLCO within normal limits.  Stiolto provides modest relief.  Recommend continuing.  Asthma versus chronic bronchitis: Clinical diagnosis based on bronchial wall thickening as well as cigarette smoking history. PFTs w/o obstruction. Continue Stiolto.  Eosinophils not elevated on review of labs.   Return in about 1 year (around 03/27/2025) for f/u Dr. Annella.   Donnice JONELLE Annella, MD 03/27/2024

## 2024-03-27 NOTE — Patient Instructions (Signed)
 Nice to see you again  No change in the medicine  Stiolto refilled  Return to clinic in 1 year or sooner as needed with Dr. Annella

## 2024-03-28 ENCOUNTER — Ambulatory Visit: Admitting: Medical

## 2024-03-28 NOTE — Progress Notes (Deleted)
  Cardiology Office Note   Date:  03/28/2024  ID:  Naiah, Donahoe 06/06/1940, MRN [id number] PCP: Myrla Jon HERO, MD  [facility] Providers Cardiologist:  Evalene Lunger, MD Electrophysiologist:  OLE ONEIDA HOLTS, MD { Click to update primary MD,subspecialty MD or APP then REFRESH:1}    History of Present Illness Freyja Govea Castelluccio is a 84 y.o. female with a history of HFpEF, permanent atrial fibrillation status post AV nodal ablation, hypertension, OSA, COPD who presents for preop evaluation.   Echo in May 2024 showed EF of 60 to 65%, normal RV, RV SP 38 mmHg, moderate TR.   She was seen in the ED 05/26/2023 for abnormal labs at a PCP visit.  Noted orthostatic dizziness and dyspnea on exertion.  Troponin was mildly elevated and BUN was also mildly elevated.  She was hypertensive but otherwise stable vitals.  Troponin trend and EKG were not consistent with ACS.  Chest x-ray with some evidence of CHF.  She was given extra dose of p.o. Lasix .  She was discharged home and referred to advanced heart failure clinic.  She was admitted 01/23/2024 with non-STEMI.  High-sensitivity troponin elevated.  Echo showed EF 50 to 55%.  Cath with small vessel disease.  Medical management was pursued.  DAPT with aspirin  and Plavix  x 12 months.  The patient is DNR.  Patient was brought to cardiac rehab.  Patient was last seen 02/05/2024 by the advanced heart failure team and was stable from a cardiac perspective.    ROS: ***  Studies Reviewed      *** Risk Assessment/Calculations {Does this patient have ATRIAL FIBRILLATION?[phone]}         Physical Exam VS:  There were no vitals taken for this visit.       Wt Readings from Last 3 Encounters:  03/27/24 217 lb (98.4 kg)  02/14/24 215 lb 3.2 oz (97.6 kg)  02/05/24 214 lb (97.1 kg)    GEN: Well nourished, well developed in no acute distress NECK: No JVD; No carotid bruits CARDIAC: ***RRR, no murmurs, rubs, gallops RESPIRATORY:   Clear to auscultation without rales, wheezing or rhonchi  ABDOMEN: Soft, non-tender, non-distended EXTREMITIES:  No edema; No deformity   ASSESSMENT AND PLAN ***    {Are you ordering a CV Procedure (e.g. stress test, cath, DCCV, TEE, etc)?   Press F2        :[id number]}  Dispo: ***  Signed, Kanesha Cadle VEAR Fishman, PA-C

## 2024-03-29 ENCOUNTER — Encounter

## 2024-03-29 DIAGNOSIS — I5032 Chronic diastolic (congestive) heart failure: Secondary | ICD-10-CM | POA: Diagnosis not present

## 2024-03-29 DIAGNOSIS — I214 Non-ST elevation (NSTEMI) myocardial infarction: Secondary | ICD-10-CM

## 2024-03-29 NOTE — Progress Notes (Signed)
 Daily Session Note  Patient Details  Name: Lauren Lloyd MRN: [id number] Date of Birth: [birth year] Referring Provider:   Flowsheet Row Cardiac Rehab from 02/14/2024 in [facility] Cardiac and Pulmonary Rehab  Referring Provider Dr. Alm Clay    Encounter Date: 03/29/2024  Check In:  Session Check In - 03/29/24 0731       Check-In   Supervising physician immediately available to respond to emergencies See telemetry face sheet for immediately available ER MD    Location [facility]-Cardiac & Pulmonary Rehab    Staff Present Burnard Davenport RN,BSN,MPA;Joseph Rolinda RCP,RRT,BSRT;Noah Tickle, [place], Exercise Physiologist    Virtual Visit No    Medication changes reported     No    Fall or balance concerns reported    No    Tobacco Cessation No Change    Warm-up and Cool-down Performed on first and last piece of equipment    Resistance Training Performed Yes    VAD Patient? No    PAD/SET Patient? No      Pain Assessment   Currently in Pain? No/denies             Social History   Tobacco Use  Smoking Status Former   Current packs/day: 0.00   Average packs/day: 1 pack/day for 30.0 years (30.0 ttl pk-yrs)   Types: Cigarettes   Start date: 05/17/1959   Quit date: 05/16/1989   Years since quitting: 34.8   Passive exposure: Past  Smokeless Tobacco Never    Goals Met:  Independence with exercise equipment Exercise tolerated well No report of concerns or symptoms today Strength training completed today  Goals Unmet:  Not Applicable  Comments: Pt able to follow exercise prescription today without complaint.  Will continue to monitor for progression.    Dr. Oneil Pinal is Medical Director for [facility] Cardiac Rehabilitation.  Dr. Fuad Aleskerov is Medical Director for [facility] Pulmonary Rehabilitation.

## 2024-04-01 ENCOUNTER — Other Ambulatory Visit: Payer: Self-pay

## 2024-04-01 ENCOUNTER — Encounter

## 2024-04-01 ENCOUNTER — Other Ambulatory Visit: Payer: Self-pay | Admitting: Family Medicine

## 2024-04-01 DIAGNOSIS — I5032 Chronic diastolic (congestive) heart failure: Secondary | ICD-10-CM | POA: Diagnosis not present

## 2024-04-01 DIAGNOSIS — I214 Non-ST elevation (NSTEMI) myocardial infarction: Secondary | ICD-10-CM

## 2024-04-01 NOTE — Progress Notes (Signed)
 Daily Session Note  Patient Details  Name: Lauren Lloyd MRN: [id number] Date of Birth: 1941-04-25 Referring Provider:   Flowsheet Row Cardiac Rehab from 02/14/2024 in [facility] Cardiac and Pulmonary Rehab  Referring Provider Dr. Alm Clay    Encounter Date: 04/01/2024  Check In:  Session Check In - 04/01/24 0921       Check-In   Supervising physician immediately available to respond to emergencies See telemetry face sheet for immediately available ER MD    Location [facility]-Cardiac & Pulmonary Rehab    Staff Present Burnard Davenport RN,BSN,MPA;Joseph Rolinda RCP,RRT,BSRT;Laura Cates RN,BSN;Pal Shell Dyane BS, ACSM CEP, Exercise Physiologist    Virtual Visit No    Medication changes reported     No    Fall or balance concerns reported    No    Tobacco Cessation No Change    Warm-up and Cool-down Performed on first and last piece of equipment    Resistance Training Performed Yes    VAD Patient? No    PAD/SET Patient? No      Pain Assessment   Currently in Pain? No/denies             Social History   Tobacco Use  Smoking Status Former   Current packs/day: 0.00   Average packs/day: 1 pack/day for 30.0 years (30.0 ttl pk-yrs)   Types: Cigarettes   Start date: 05/17/1959   Quit date: 05/16/1989   Years since quitting: 34.9   Passive exposure: Past  Smokeless Tobacco Never    Goals Met:  Independence with exercise equipment Exercise tolerated well No report of concerns or symptoms today Strength training completed today  Goals Unmet:  Not Applicable  Comments: Pt able to follow exercise prescription today without complaint.  Will continue to monitor for progression.    Dr. Oneil Pinal is Medical Director for [facility] Cardiac Rehabilitation.  Dr. Fuad Aleskerov is Medical Director for [facility] Pulmonary Rehabilitation.

## 2024-04-03 ENCOUNTER — Encounter

## 2024-04-03 DIAGNOSIS — I214 Non-ST elevation (NSTEMI) myocardial infarction: Secondary | ICD-10-CM

## 2024-04-03 NOTE — Progress Notes (Signed)
 Cardiac Individual Treatment Plan  Patient Details  Name: Lauren Lloyd MRN: [id number] Date of Birth: [birth year] Referring Provider:   Flowsheet Row Cardiac Rehab from 02/14/2024 in [facility] Cardiac and Pulmonary Rehab  Referring Provider Dr. Alm Clay    Initial Encounter Date:  Flowsheet Row Cardiac Rehab from 02/14/2024 in [facility] Cardiac and Pulmonary Rehab  Date 02/14/24    Visit Diagnosis: NSTEMI (non-ST elevated myocardial infarction) [facility])  Patient's Home Medications on Admission:  Current Outpatient Medications:    allopurinol  (ZYLOPRIM ) 100 MG tablet, TAKE ONE TABLET BY MOUTH EVERY DAY, Disp: 90 tablet, Rfl: 1   atorvastatin  (LIPITOR ) 80 MG tablet, TAKE ONE TABLET BY MOUTH DAILY, Disp: 30 tablet, Rfl: 0   calcium  carbonate (TUMS EX) 750 MG chewable tablet, Chew 2 tablets by mouth daily as needed for heartburn., Disp: , Rfl:    Cholecalciferol  25 MCG (1000 UT) tablet, Take 1,000 Units by mouth daily., Disp: , Rfl:    cholestyramine (QUESTRAN) 4 g packet, Take 4 g by mouth., Disp: , Rfl:    clopidogrel  (PLAVIX ) 75 MG tablet, TAKE 1 TABLET BY MOUTH DAILY, Disp: 30 tablet, Rfl: 0   dapagliflozin  propanediol (FARXIGA ) 10 MG TABS tablet, Take 1 tablet (10 mg total) by mouth daily before breakfast., Disp: 30 tablet, Rfl: 6   ELIQUIS  5 MG TABS tablet, TAKE ONE TABLET TWICE DAILY, Disp: 180 tablet, Rfl: 1   ezetimibe  (ZETIA ) 10 MG tablet, TAKE 1 TABLET BY MOUTH DAILY, Disp: 90 tablet, Rfl: 3   furosemide  (LASIX ) 40 MG tablet, TAKE 1 TABLET BY MOUTH DAILY. TAKE AN EXTRA TABLET AS NEEDED AFTER LUNCH FOR ABDOMINAL SWELLING, LEG SWELLING OR SHORTNESS OF BREATH, Disp: 180 tablet, Rfl: 1   gabapentin  (NEURONTIN ) 600 MG tablet, Take 1 tablet (600 mg total) by mouth at bedtime., Disp: 90 tablet, Rfl: 3   lactobacillus acidophilus (BACID) TABS tablet, Take 1-2 tablets by mouth 2 (two) times daily. 2 AM AND 1 HS, Disp: , Rfl:    Magnesium  300 MG CAPS, Take 2 capsules by mouth daily., Disp: , Rfl:     Menthol, Topical Analgesic, (BIOFREEZE EX), Apply 1 application. topically daily as needed (Neck pain)., Disp: , Rfl:    metaxalone  (SKELAXIN ) 800 MG tablet, Take 1 tablet (800 mg total) by mouth daily as needed for muscle spasms., Disp: 30 tablet, Rfl: 2   metoprolol  tartrate (LOPRESSOR ) 25 MG tablet, TAKE 0.5 TABLET BY MOUTH 2 TIMES DAILY., Disp: 30 tablet, Rfl: 0   Multiple Vitamin (MULTIVITAMIN) capsule, Take 1 capsule by mouth daily., Disp: , Rfl:    mupirocin  ointment (BACTROBAN ) 2 %, Place 1 application  into the nose 2 (two) times daily., Disp: 22 g, Rfl: 0   nitroGLYCERIN  (NITROSTAT ) 0.4 MG SL tablet, Place 1 tablet (0.4 mg total) under the tongue every 5 (five) minutes as needed for chest pain., Disp: 30 tablet, Rfl: 12   potassium chloride  (KLOR-CON ) 10 MEQ tablet, Take 2 tablets (20 mEq total) by mouth daily., Disp: 60 tablet, Rfl: 10   Tafamidis  (VYNDAMAX ) 61 MG CAPS, Take 1 capsule (61 mg total) by mouth daily., Disp: 30 capsule, Rfl: 11   Tiotropium Bromide -Olodaterol (STIOLTO RESPIMAT ) 2.5-2.5 MCG/ACT AERS, Inhale 2 puffs into the lungs daily as needed., Disp: 3 each, Rfl: 4  Past Medical History: Past Medical History:  Diagnosis Date   (HFpEF) heart failure with preserved ejection fraction (HCC)    a. 05/2018 Echo: EF 55-60%, no rwma, mild to mod MR. Nl RV fxn. Mod TR. PASP .  Arthritis    knees, Hands   Arthritis of knee    Back pain    Carotid arterial disease    a. 03/2019 Carotid U/S: <50% bilat ICA stenoses.   CHF (congestive heart failure) (HCC)    Cholelithiasis    a. 10/2018 noted on CT.   Edema, lower extremity    Fatty liver    GERD (gastroesophageal reflux disease)    History of stress test    a. 06/2018 MV: EF 59%, no ischemia/infarct. Low risk.   Hypertension    Knee pain    Lactose intolerance    Mitral regurgitation    a. 05/2018 Echo: mild to mod MR.   Multinodular goiter    Obesity    OSA (obstructive sleep apnea)    PAF (paroxysmal atrial  fibrillation) (HCC)    a.  Diagnosed 12/19; b. 05/2018 s/p DCCV; c. 03/2019 & 05/2019 recurrent AFib-->managed w/ amio load; d. CHADS2VASc = 6 (CHF, HTN, age x 2, vascular disease, female)-->Eliquis  & amio 100 qd.   PAH (pulmonary artery hypertension) (HCC)    RSV (acute bronchiolitis due to respiratory syncytial virus) 05/10/2022   Scoliosis    SOB (shortness of breath)    Swallowing difficulty     Tobacco Use: Social History   Tobacco Use  Smoking Status Former   Current packs/day: 0.00   Average packs/day: 1 pack/day for 30.0 years (30.0 ttl pk-yrs)   Types: Cigarettes   Start date: 05/17/1959   Quit date: 05/16/1989   Years since quitting: 34.9   Passive exposure: Past  Smokeless Tobacco Never    Labs: Review Flowsheet  More data exists      Latest Ref Rng & Units 08/16/2021 03/17/2022 10/28/2022 01/11/2024 01/24/2024  Labs for ITP Cardiac and Pulmonary Rehab  Cholestrol 0 - 200 mg/dL CANCELED  837  823  782  189   LDL (calc) 0 - 99 mg/dL - 93  899  875  880   HDL-C >40 mg/dL CANCELED  52  54  71  60   Trlycerides <150 mg/dL CANCELED  94  873  875  49   Hemoglobin A1c 4.8 - 5.6 % - 6.0  6.0  5.5  -     Exercise Target Goals: Exercise Program Goal: Individual exercise prescription set using results from initial 6 min walk test and THRR while considering  patient's activity barriers and safety.   Exercise Prescription Goal: Initial exercise prescription builds to 30-45 minutes a day of aerobic activity, 2-3 days per week.  Home exercise guidelines will be given to patient during program as part of exercise prescription that the participant will acknowledge.   Education: Aerobic Exercise: - Group verbal and visual presentation on the components of exercise prescription. Introduces F.I.T.T principle from ACSM for exercise prescriptions.  Reviews F.I.T.T. principles of aerobic exercise including progression. Written material provided at class time. Flowsheet Row Cardiac Rehab from  02/14/2024 in [facility] Cardiac and Pulmonary Rehab  Education need identified 02/14/24    Education: Resistance Exercise: - Group verbal and visual presentation on the components of exercise prescription. Introduces F.I.T.T principle from ACSM for exercise prescriptions  Reviews F.I.T.T. principles of resistance exercise including progression. Written material provided at class time.    Education: Exercise & Equipment Safety: - Individual verbal instruction and demonstration of equipment use and safety with use of the equipment. Flowsheet Row Cardiac Rehab from 02/14/2024 in [facility] Cardiac and Pulmonary Rehab  Date 02/14/24  Educator [facility]  Instruction Review Code 1-  Verbalizes Understanding    Education: Exercise Physiology & General Exercise Guidelines: - Group verbal and written instruction with models to review the exercise physiology of the cardiovascular system and associated critical values. Provides general exercise guidelines with specific guidelines to those with heart or lung disease. Written material provided at class time. Flowsheet Row Cardiac Rehab from 02/14/2024 in [facility] Cardiac and Pulmonary Rehab  Education need identified 02/14/24    Education: Flexibility, Balance, Mind/Body Relaxation: - Group verbal and visual presentation with interactive activity on the components of exercise prescription. Introduces F.I.T.T principle from ACSM for exercise prescriptions. Reviews F.I.T.T. principles of flexibility and balance exercise training including progression. Also discusses the mind body connection.  Reviews various relaxation techniques to help reduce and manage stress (i.e. Deep breathing, progressive muscle relaxation, and visualization). Balance handout provided to take home. Written material provided at class time.   Activity Barriers & Risk Stratification:  Activity Barriers & Cardiac Risk Stratification - 02/14/24 1027       Activity Barriers & Cardiac Risk Stratification    Activity Barriers Arthritis;Back Problems;Balance Concerns    Cardiac Risk Stratification Moderate          6 Minute Walk:  6 Minute Walk     Row Name 02/14/24 1026         6 Minute Walk   Phase Initial     Distance 1180 feet     Walk Time 6 minutes     # of Rest Breaks 0     MPH 2.2     METS 2.1     RPE 13     Perceived Dyspnea  3     VO2 Peak 7.4     Symptoms Yes (comment)     Comments SOB     Resting HR 72 bpm     Resting BP 114/52     Resting Oxygen Saturation  96 %     Exercise Oxygen Saturation  during 6 min walk 95 %     Max Ex. HR 131 bpm     Max Ex. BP 162/68     2 Minute Post BP 126/66        Oxygen Initial Assessment:   Oxygen Re-Evaluation:  Oxygen Re-Evaluation     Row Name 03/20/24 0945             Program Oxygen Prescription   Program Oxygen Prescription None         Home Oxygen   Home Oxygen Device None       Sleep Oxygen Prescription CPAP       Home Exercise Oxygen Prescription None       Home Resting Oxygen Prescription None       Compliance with Home Oxygen Use No         Goals/Expected Outcomes   Short Term Goals To learn and demonstrate proper use of respiratory medications       Long  Term Goals Exhibits proper breathing techniques, such as pursed lip breathing or other method taught during program session       Comments Spoke with Tecora about PLB and she verbalized understanding.       Goals/Expected Outcomes STG: continue to use breathing techniques. LTG: Manages breathing and decreases shortness of breath as needed          Oxygen Discharge (Final Oxygen Re-Evaluation):  Oxygen Re-Evaluation - 03/20/24 0945       Program Oxygen Prescription   Program Oxygen Prescription None  Home Oxygen   Home Oxygen Device None    Sleep Oxygen Prescription CPAP    Home Exercise Oxygen Prescription None    Home Resting Oxygen Prescription None    Compliance with Home Oxygen Use No      Goals/Expected Outcomes   Short Term  Goals To learn and demonstrate proper use of respiratory medications    Long  Term Goals Exhibits proper breathing techniques, such as pursed lip breathing or other method taught during program session    Comments Spoke with Shalena about PLB and she verbalized understanding.    Goals/Expected Outcomes STG: continue to use breathing techniques. LTG: Manages breathing and decreases shortness of breath as needed          Initial Exercise Prescription:  Initial Exercise Prescription - 02/14/24 1000       Date of Initial Exercise RX and Referring Provider   Date 02/14/24    Referring Provider Dr. Alm Clay      Oxygen   Maintain Oxygen Saturation 88% or higher      Treadmill   MPH 2    Grade 0    Minutes 15    METs 2.53      Recumbant Bike   Level 3    RPM 50    Watts 25    Minutes 15    METs 2.1      NuStep   Level 3    SPM 80    Minutes 15    METs 2.1      REL-XR   Level 3    Watts 25    Speed 50    Minutes 15    METs 2.1      T5 Nustep   Level 3    Minutes 15    METs 2.1      Track   Laps 31    Minutes 15    METs 2.69      Prescription Details   Duration Progress to 30 minutes of continuous aerobic without signs/symptoms of physical distress      Intensity   THRR 40-80% of Max Heartrate 97-123    Ratings of Perceived Exertion 11-13    Perceived Dyspnea 0-4      Progression   Progression Continue to progress workloads to maintain intensity without signs/symptoms of physical distress.      Resistance Training   Training Prescription Yes    Weight 4lb    Reps 10-15          Perform Capillary Blood Glucose checks as needed.  Exercise Prescription Changes:   Exercise Prescription Changes     Row Name 02/14/24 1000 03/07/24 0800 03/20/24 0900 03/21/24 1600 04/02/24 1500     Response to Exercise   Blood Pressure (Admit) 114/52 120/70 -- 138/76 142/78   Blood Pressure (Exercise) 162/68 144/62 -- 144/62 162/74   Blood Pressure (Exit)  126/66 122/62 -- 122/62 122/60   Heart Rate (Admit) 72 bpm 131 bpm -- 98 bpm 76 bpm   Heart Rate (Exercise) 131 bpm 132 bpm -- 131 bpm 131 bpm   Heart Rate (Exit) 78 bpm 74 bpm -- 68 bpm 88 bpm   Oxygen Saturation (Admit) 96 % -- -- -- --   Oxygen Saturation (Exercise) 95 % -- -- -- --   Oxygen Saturation (Exit) 95 % -- -- -- --   Rating of Perceived Exertion (Exercise) 13 15 -- 14 13   Perceived Dyspnea (Exercise) 3 -- -- -- --  Symptoms none none -- none none   Comments results 1st 2 weeks of exercise -- -- --   Duration -- Progress to 30 minutes of  aerobic without signs/symptoms of physical distress -- Progress to 30 minutes of  aerobic without signs/symptoms of physical distress Continue with 30 min of aerobic exercise without signs/symptoms of physical distress.   Intensity -- THRR unchanged -- THRR unchanged THRR unchanged     Progression   Progression -- Continue to progress workloads to maintain intensity without signs/symptoms of physical distress. -- Continue to progress workloads to maintain intensity without signs/symptoms of physical distress. Continue to progress workloads to maintain intensity without signs/symptoms of physical distress.   Average METs -- 2.51 -- 2.5 2.33     Resistance Training   Training Prescription -- Yes -- Yes Yes   Weight -- 4lb -- 4lb 4 lb   Reps -- 10-15 -- 10-15 10-15     Interval Training   Interval Training -- No -- No No     Treadmill   MPH -- 1.9 -- 2 --   Grade -- 0 -- 0 --   Minutes -- 15 -- 15 --   METs -- 2.45 -- 2.53 --     NuStep   Level -- 4 -- 4 --   Minutes -- 15 -- 15 --   METs -- 3.5 -- 3.4 --     REL-XR   Level -- 3 -- 3 3   Minutes -- 15 -- 15 15   METs -- 2.8 -- 3.3 3.5     Biostep-RELP   Level -- 3 -- 2 2   SPM -- 50 -- -- --   Minutes -- 15 -- 15 15   METs -- 2 -- 3 2     Track   Laps -- -- -- 15 25   Minutes -- -- -- 15 15   METs -- -- -- 1.82 2.36     Home Exercise Plan   Plans to continue  exercise at -- -- [employer] (comment)  [employer] for aerobic machines and strength training (w/ her personal trainer) [employer] (comment)  [employer] for [employer] and 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 Add 1 additional day to program exercise sessions. Add 1 additional day to program exercise sessions.   Initial Home Exercises Provided -- -- 03/20/24 03/20/24 03/20/24     Oxygen   Maintain Oxygen Saturation -- 88% or higher -- 88% or higher 88% or higher      Exercise Comments:   Exercise Comments     Row Name 02/19/24 9078           Exercise Comments First full day of exercise!  Patient was oriented to gym and equipment including functions, settings, policies, and procedures.  Patient's individual exercise prescription and treatment plan were reviewed.  All starting workloads were established based on the results of the 6 minute walk test done at initial orientation visit.  The plan for exercise progression was also introduced and progression will be customized based on patient's performance and goals.          Exercise Goals and Review:   Exercise Goals     Row Name 02/14/24 1031             Exercise Goals   Increase  Physical Activity Yes       Intervention Provide advice, education, support and counseling about physical activity/exercise needs.;Develop an individualized exercise prescription for aerobic and resistive training based on initial evaluation findings, risk stratification, comorbidities and participant's personal goals.       Expected Outcomes Short Term: Attend rehab on a regular basis to increase amount of physical activity.;Long Term: Exercising regularly at least 3-5 days a week.;Long Term: Add in home exercise to make exercise part of routine and to increase amount of  physical activity.       Increase Strength and Stamina Yes       Intervention Provide advice, education, support and counseling about physical activity/exercise needs.;Develop an individualized exercise prescription for aerobic and resistive training based on initial evaluation findings, risk stratification, comorbidities and participant's personal goals.       Expected Outcomes Short Term: Increase workloads from initial exercise prescription for resistance, speed, and METs.;Short Term: Perform resistance training exercises routinely during rehab and add in resistance training at home;Long Term: Improve cardiorespiratory fitness, muscular endurance and strength as measured by increased METs and functional capacity ( )       Able to understand and use rate of perceived exertion (RPE) scale Yes       Intervention Provide education and explanation on how to use RPE scale       Expected Outcomes Short Term: Able to use RPE daily in rehab to express subjective intensity level;Long Term:  Able to use RPE to guide intensity level when exercising independently       Able to understand and use Dyspnea scale Yes       Intervention Provide education and explanation on how to use Dyspnea scale       Expected Outcomes Short Term: Able to use Dyspnea scale daily in rehab to express subjective sense of shortness of breath during exertion;Long Term: Able to use Dyspnea scale to guide intensity level when exercising independently       Knowledge and understanding of Target Heart Rate Range (THRR) Yes       Intervention Provide education and explanation of THRR including how the numbers were predicted and where they are located for reference       Expected Outcomes Short Term: Able to state/look up THRR;Long Term: Able to use THRR to govern intensity when exercising independently;Short Term: Able to use daily as guideline for intensity in rehab       Able to check pulse independently Yes       Intervention Provide  education and demonstration on how to check pulse in carotid and radial arteries.;Review the importance of being able to check your own pulse for safety during independent exercise       Expected Outcomes Short Term: Able to explain why pulse checking is important during independent exercise;Long Term: Able to check pulse independently and accurately       Understanding of Exercise Prescription Yes       Intervention Provide education, explanation, and written materials on patient's individual exercise prescription       Expected Outcomes Short Term: Able to explain program exercise prescription;Long Term: Able to explain home exercise prescription to exercise independently          Exercise Goals Re-Evaluation :  Exercise Goals Re-Evaluation     Row Name 02/19/24 0921 03/07/24 0808 03/20/24 0957 03/21/24 1628 04/02/24 1514     Exercise Goal Re-Evaluation   Exercise Goals Review Increase Physical Activity;Able to understand and use rate  of perceived exertion (RPE) scale;Knowledge and understanding of Target Heart Rate Range (THRR);Understanding of Exercise Prescription;Increase Strength and Stamina;Able to understand and use Dyspnea scale;Able to check pulse independently Increase Physical Activity;Understanding of Exercise Prescription;Increase Strength and Stamina Increase Physical Activity;Able to understand and use Dyspnea scale;Understanding of Exercise Prescription;Increase Strength and Stamina;Knowledge and understanding of Target Heart Rate Range (THRR);Able to understand and use rate of perceived exertion (RPE) scale;Able to check pulse independently Increase Physical Activity;Increase Strength and Stamina;Understanding of Exercise Prescription Increase Physical Activity;Increase Strength and Stamina;Understanding of Exercise Prescription   Comments Reviewed RPE and dyspnea scale, THR and program prescription with pt today.  Pt voiced understanding and was given a copy of goals to take home.  Jaylanie is off to a good start in the program and she completed her first 2 weeks in this review. She worked at level 4 on the T4 nustep, level 3 on the biostep, and level 3 on the XR. She had a workload on the treadmill of a speed of 1.9 mph with no incline. We will continue to monitor her progress in the program. Reviewed home exercise with pt today.  Pt plans to go to the [facility] for aerobic machines and strength training with her personal trainer for exercise. She plans to start back with 1 additional day of exercise at home.  Reviewed THR, pulse, RPE, sign and symptoms, pulse oximetery and when to call 911 or MD.  Also discussed weather considerations and indoor options.  Pt voiced understanding. Leitha continues to do well in rehab. She has been able to increase her speed on the treadmill from 1.9mph to 2mph. She was also able to maintain level 4 on the T4 nustep and level 3 on the XR. We will continue to monitor her progress in the program. Lizbet continues to do well in rehab. She has been able to increase her laps on the track to 25 laps walked. She also continues to work at level 3 on the XR and level 2 on the biostep. We will continue to monitor her progress in the program.   Expected Outcomes Short: Use RPE daily to regulate intensity. Long: Follow program prescription in THR. Short: Continue to follow current exercise prescription. Long: Continue exercise to improve strength and stamina. Short: Add 1 additional day of exercise at home. Long: Continue to exercise independently. Short: Continue to increase treadmill workload. Long: Continue exercise to improve strength and stamina. Short: Increase to level 3 on the biostep. Long: Continue exercise to improve strength and stamina.      Discharge Exercise Prescription (Final Exercise Prescription Changes):  Exercise Prescription Changes - 04/02/24 1500       Response to Exercise   Blood Pressure (Admit) 142/78    Blood Pressure (Exercise) 162/74     Blood Pressure (Exit) 122/60    Heart Rate (Admit) 76 bpm    Heart Rate (Exercise) 131 bpm    Heart Rate (Exit) 88 bpm    Rating of Perceived Exertion (Exercise) 13    Symptoms none    Duration Continue with 30 min of aerobic exercise without signs/symptoms of physical distress.    Intensity THRR unchanged      Progression   Progression Continue to progress workloads to maintain intensity without signs/symptoms of physical distress.    Average METs 2.33      Resistance Training   Training Prescription Yes    Weight 4 lb    Reps 10-15      Interval Training  Interval Training No      REL-XR   Level 3    Minutes 15    METs 3.5      Biostep-RELP   Level 2    Minutes 15    METs 2      Track   Laps 25    Minutes 15    METs 2.36      Home Exercise Plan   Plans to continue exercise at [employer] (comment)   [employer] for aerobic machines and strength training (w/ her personal trainer)   Frequency Add 1 additional day to program exercise sessions.    Initial Home Exercises Provided 03/20/24      Oxygen   Maintain Oxygen Saturation 88% or higher          Nutrition:  Target Goals: Understanding of nutrition guidelines, daily intake of sodium 1500mg , cholesterol 200mg , calories 30% from fat and 7% or less from saturated fats, daily to have 5 or more servings of fruits and vegetables.  Education: Nutrition 1 -Group instruction provided by verbal, written material, interactive activities, discussions, models, and posters to present general guidelines for heart healthy nutrition including macronutrients, label reading, and promoting whole foods over processed counterparts. Education serves as pensions consultant of discussion of heart healthy eating for all. Written material provided at class time.    Education: Nutrition 2 -Group instruction provided by verbal, written material, interactive activities, discussions, models, and posters to present general guidelines for heart  healthy nutrition including sodium, cholesterol, and saturated fat. Providing guidance of habit forming to improve blood pressure, cholesterol, and body weight. Written material provided at class time.     Biometrics:  Pre Biometrics - 02/14/24 1032       Pre Biometrics   Height 5' 6.2 (1.681 m)    Weight 215 lb 3.2 oz (97.6 kg)    Waist Circumference 40 inches    Hip Circumference 47 inches    Waist to Hip Ratio 0.85 %    BMI (Calculated) 34.54    Single Leg Stand 3 seconds           Nutrition Therapy Plan and Nutrition Goals:   Nutrition Assessments:  MEDIFICTS Score Key: >=70 Need to make dietary changes  40-70 Heart Healthy Diet <= 40 Therapeutic Level Cholesterol Diet  Flowsheet Row Cardiac Rehab from 02/14/2024 in [facility] Cardiac and Pulmonary Rehab  Picture Your Plate Total Score on Admission 54   Picture Your Plate Scores: <59 Unhealthy dietary pattern with much room for improvement. 41-50 Dietary pattern unlikely to meet recommendations for good health and room for improvement. 51-60 More healthful dietary pattern, with some room for improvement.  >60 Healthy dietary pattern, although there may be some specific behaviors that could be improved.    Nutrition Goals Re-Evaluation:  Nutrition Goals Re-Evaluation     Row Name 03/20/24 [phone]             Goals   Comment Spoke with Raygen about eating smaller meals during the day and making sure she eats nutrient dense foods to help her meet her nutrition goals       Expected Outcome STG: Continue to focus on protien and read labels controlling sodium intake LTG: Follow a healthy diet that supports increased needs for pulmonary health          Nutrition Goals Discharge (Final Nutrition Goals Re-Evaluation):  Nutrition Goals Re-Evaluation - 03/20/24 0948       Goals   Comment Spoke with Loza about eating  smaller meals during the day and making sure she eats nutrient dense foods to help her meet her nutrition  goals    Expected Outcome STG: Continue to focus on protien and read labels controlling sodium intake LTG: Follow a healthy diet that supports increased needs for pulmonary health          Psychosocial: Target Goals: Acknowledge presence or absence of significant depression and/or stress, maximize coping skills, provide positive support system. Participant is able to verbalize types and ability to use techniques and skills needed for reducing stress and depression.   Education: Stress, Anxiety, and Depression - Group verbal and visual presentation to define topics covered.  Reviews how body is impacted by stress, anxiety, and depression.  Also discusses healthy ways to reduce stress and to treat/manage anxiety and depression. Written material provided at class time.   Education: Sleep Hygiene -Provides group verbal and written instruction about how sleep can affect your health.  Define sleep hygiene, discuss sleep cycles and impact of sleep habits. Review good sleep hygiene tips.   Initial Review & Psychosocial Screening:  Initial Psych Review & Screening - 02/07/24 1014       Initial Review   Current issues with None Identified      Family Dynamics   Good Support System? Yes      Barriers   Psychosocial barriers to participate in program The patient should benefit from training in stress management and relaxation.;There are no identifiable barriers or psychosocial needs.      Screening Interventions   Interventions Encouraged to exercise;To provide support and resources with identified psychosocial needs;Provide feedback about the scores to participant    Expected Outcomes Short Term goal: Utilizing psychosocial counselor, staff and physician to assist with identification of specific Stressors or current issues interfering with healing process. Setting desired goal for each stressor or current issue identified.;Long Term Goal: Stressors or current issues are controlled or  eliminated.;Short Term goal: Identification and review with participant of any Quality of Life or Depression concerns found by scoring the questionnaire.;Long Term goal: The participant improves quality of Life and PHQ9 Scores as seen by post scores and/or verbalization of changes          Quality of Life Scores:   Quality of Life - 02/14/24 1032       Quality of Life   Select Quality of Life      Quality of Life Scores   Health/Function Pre 20.47 %    Socioeconomic Pre 26.25 %    Psych/Spiritual Pre 30 %    Family Pre 21.6 %    GLOBAL Pre 23.86 %         Scores of 19 and below usually indicate a poorer quality of life in these areas.  A difference of  2-3 points is a clinically meaningful difference.  A difference of 2-3 points in the total score of the Quality of Life Index has been associated with significant improvement in overall quality of life, self-image, physical symptoms, and general health in studies assessing change in quality of life.  PHQ-9: Review Flowsheet  More data exists      02/14/2024 01/17/2024 01/11/2024 12/07/2023 11/10/2023  Depression screen PHQ 2/9  Decreased Interest 0 0 0 0 0  Down, Depressed, Hopeless 0 0 0 0 0  PHQ - 2 Score 0 0 0 0 0  Altered sleeping 0 0 1 0 -  Tired, decreased energy 1 0 1 1 -  Change in appetite 0 0  1 0 -  Feeling bad or failure about yourself  0 0 - 0 -  Trouble concentrating 0 0 0 0 -  Moving slowly or fidgety/restless 0 0 0 0 -  Suicidal thoughts 0 0 0 0 -  PHQ-9 Score 1  0  3  1  -  Difficult doing work/chores Not difficult at all Not difficult at all Somewhat difficult Somewhat difficult -    Details       Data saved with a previous flowsheet row definition        Interpretation of Total Score  Total Score Depression Severity:  1-4 = Minimal depression, 5-9 = Mild depression, 10-14 = Moderate depression, 15-19 = Moderately severe depression, 20-27 = Severe depression   Psychosocial Evaluation and  Intervention:  Psychosocial Evaluation - 02/07/24 1026       Psychosocial Evaluation & Interventions   Interventions Encouraged to exercise with the program and follow exercise prescription    Comments Ms. Benelli is coming to cardiac rehab after a NSTEMI. When asked about stress, she states she is under a normal amount of stress that any body else would be and she feels like she is handling it well. She reports no sleep concerns either. She is ready to start the program.    Expected Outcomes Short: attend cardiac rehab for education and exercise Long: develop and maintain positive self care habits    Continue Psychosocial Services  Follow up required by staff          Psychosocial Re-Evaluation:  Psychosocial Re-Evaluation     Row Name 03/20/24 0946             Psychosocial Re-Evaluation   Current issues with Current Sleep Concerns       Comments Stacey reports she has struggled with sleep, but lately she has gotten more used to wearing her CPAP and says it has helped alot.       Expected Outcomes STG: Continue to wear CPAP. LTG: achieve and maintain positive outlook on health and daily life       Interventions Encouraged to attend Pulmonary Rehabilitation for the exercise       Continue Psychosocial Services  Follow up required by staff          Psychosocial Discharge (Final Psychosocial Re-Evaluation):  Psychosocial Re-Evaluation - 03/20/24 0946       Psychosocial Re-Evaluation   Current issues with Current Sleep Concerns    Comments Taleah reports she has struggled with sleep, but lately she has gotten more used to wearing her CPAP and says it has helped alot.    Expected Outcomes STG: Continue to wear CPAP. LTG: achieve and maintain positive outlook on health and daily life    Interventions Encouraged to attend Pulmonary Rehabilitation for the exercise    Continue Psychosocial Services  Follow up required by staff          Vocational Rehabilitation: Provide  vocational rehab assistance to qualifying candidates.   Vocational Rehab Evaluation & Intervention:   Education: Education Goals: Education classes will be provided on a variety of topics geared toward better understanding of heart health and risk factor modification. Participant will state understanding/return demonstration of topics presented as noted by education test scores.  Learning Barriers/Preferences:  Learning Barriers/Preferences - 02/07/24 1026       Learning Barriers/Preferences   Learning Barriers None    Learning Preferences None          General Cardiac Education Topics:  AED/CPR: -  Group verbal and written instruction with the use of models to demonstrate the basic use of the AED with the basic ABC's of resuscitation.   Test and Procedures: - Group verbal and visual presentation and models provide information about basic cardiac anatomy and function. Reviews the testing methods done to diagnose heart disease and the outcomes of the test results. Describes the treatment choices: Medical Management, Angioplasty, or Coronary Bypass Surgery for treating various heart conditions including Myocardial Infarction, Angina, Valve Disease, and Cardiac Arrhythmias. Written material provided at class time.   Medication Safety: - Group verbal and visual instruction to review commonly prescribed medications for heart and lung disease. Reviews the medication, class of the drug, and side effects. Includes the steps to properly store meds and maintain the prescription regimen. Written material provided at class time.   Intimacy: - Group verbal instruction through game format to discuss how heart and lung disease can affect sexual intimacy. Written material provided at class time.   Know Your Numbers and Heart Failure: - Group verbal and visual instruction to discuss disease risk factors for cardiac and pulmonary disease and treatment options.  Reviews associated critical values  for Overweight/Obesity, Hypertension, Cholesterol, and Diabetes.  Discusses basics of heart failure: signs/symptoms and treatments.  Introduces Heart Failure Zone chart for action plan for heart failure. Written material provided at class time.   Infection Prevention: - Provides verbal and written material to individual with discussion of infection control including proper hand washing and proper equipment cleaning during exercise session. Flowsheet Row Cardiac Rehab from 02/14/2024 in [facility] Cardiac and Pulmonary Rehab  Date 02/14/24  Educator [facility]  Instruction Review Code 1- Verbalizes Understanding    Falls Prevention: - Provides verbal and written material to individual with discussion of falls prevention and safety. Flowsheet Row Cardiac Rehab from 02/14/2024 in [facility] Cardiac and Pulmonary Rehab  Date 02/14/24  Educator [facility]  Instruction Review Code 1- Verbalizes Understanding    Other: -Provides group and verbal instruction on various topics (see comments)   Knowledge Questionnaire Score:  Knowledge Questionnaire Score - 02/14/24 1033       Knowledge Questionnaire Score   Pre Score 23/26          Core Components/Risk Factors/Patient Goals at Admission:  Personal Goals and Risk Factors at Admission - 02/07/24 1013       Core Components/Risk Factors/Patient Goals on Admission    Weight Management Yes;Weight Loss    Intervention Weight Management: Develop a combined nutrition and exercise program designed to reach desired caloric intake, while maintaining appropriate intake of nutrient and fiber, sodium and fats, and appropriate energy expenditure required for the weight goal.;Weight Management: Provide education and appropriate resources to help participant work on and attain dietary goals.;Weight Management/Obesity: Establish reasonable short term and long term weight goals.;Obesity: Provide education and appropriate resources to help participant work on and attain dietary goals.     Expected Outcomes Short Term: Continue to assess and modify interventions until short term weight is achieved;Long Term: Adherence to nutrition and physical activity/exercise program aimed toward attainment of established weight goal;Weight Maintenance: Understanding of the daily nutrition guidelines, which includes 25-35% calories from fat, 7% or less cal from saturated fats, less than 200mg  cholesterol, less than 1.5gm of sodium, & 5 or more servings of fruits and vegetables daily;Weight Loss: Understanding of general recommendations for a balanced deficit meal plan, which promotes 1-2 lb weight loss per week and includes a negative energy balance of [phone] kcal/d;Understanding recommendations for meals to  include 15-35% energy as protein, 25-35% energy from fat, 35-60% energy from carbohydrates, less than 200mg  of dietary cholesterol, 20-35 gm of total fiber daily;Understanding of distribution of calorie intake throughout the day with the consumption of 4-5 meals/snacks    Improve shortness of breath with ADL's Yes    Intervention Provide education, individualized exercise plan and daily activity instruction to help decrease symptoms of SOB with activities of daily living.    Expected Outcomes Short Term: Improve cardiorespiratory fitness to achieve a reduction of symptoms when performing ADLs;Long Term: Be able to perform more ADLs without symptoms or delay the onset of symptoms    Heart Failure Yes    Intervention Provide a combined exercise and nutrition program that is supplemented with education, support and counseling about heart failure. Directed toward relieving symptoms such as shortness of breath, decreased exercise tolerance, and extremity edema.    Expected Outcomes Improve functional capacity of life;Short term: Attendance in program 2-3 days a week with increased exercise capacity. Reported lower sodium intake. Reported increased fruit and vegetable intake. Reports medication  compliance.;Short term: Daily weights obtained and reported for increase. Utilizing diuretic protocols set by physician.;Long term: Adoption of self-care skills and reduction of barriers for early signs and symptoms recognition and intervention leading to self-care maintenance.    Hypertension Yes    Intervention Provide education on lifestyle modifcations including regular physical activity/exercise, weight management, moderate sodium restriction and increased consumption of fresh fruit, vegetables, and low fat dairy, alcohol moderation, and smoking cessation.;Monitor prescription use compliance.    Expected Outcomes Short Term: Continued assessment and intervention until BP is < 140/45mm HG in hypertensive participants. < 130/36mm HG in hypertensive participants with diabetes, heart failure or chronic kidney disease.;Long Term: Maintenance of blood pressure at goal levels.    Lipids Yes    Intervention Provide education and support for participant on nutrition & aerobic/resistive exercise along with prescribed medications to achieve LDL 70mg , HDL >40mg .    Expected Outcomes Short Term: Participant states understanding of desired cholesterol values and is compliant with medications prescribed. Participant is following exercise prescription and nutrition guidelines.;Long Term: Cholesterol controlled with medications as prescribed, with individualized exercise RX and with personalized nutrition plan. Value goals: LDL < 70mg , HDL > 40 mg.          Education:Diabetes - Individual verbal and written instruction to review signs/symptoms of diabetes, desired ranges of glucose level fasting, after meals and with exercise. Acknowledge that pre and post exercise glucose checks will be done for 3 sessions at entry of program.   Core Components/Risk Factors/Patient Goals Review:   Goals and Risk Factor Review     Row Name 03/20/24 0949             Core Components/Risk Factors/Patient Goals Review    Personal Goals Review Improve shortness of breath with ADL's       Review Suheyla reports coming to rehab has helped her perform her ADLs better and with less shortness of breath.       Expected Outcomes STG: Work with EP today to [employer] home exercise plan. LTG: exercise independently and practice breathing techniques taught at pulmonary rehab.          Core Components/Risk Factors/Patient Goals at Discharge (Final Review):   Goals and Risk Factor Review - 03/20/24 0949       Core Components/Risk Factors/Patient Goals Review   Personal Goals Review Improve shortness of breath with ADL's    Review Faithlyn reports coming to  rehab has helped her perform her ADLs better and with less shortness of breath.    Expected Outcomes STG: Work with EP today to [employer] home exercise plan. LTG: exercise independently and practice breathing techniques taught at pulmonary rehab.          ITP Comments:  ITP Comments     Row Name 02/07/24 1019 02/14/24 1026 02/19/24 0921 03/06/24 1002 04/03/24 0945   ITP Comments Initial phone call completed. Diagnosis can be found in [facility] 9/9. EP Orientation scheduled for Wednesday 10/1 at 9am. Completed and gym orientation for cardiac rehab. Initial ITP created and sent for review to Dr. Oneil Pinal, Medical Director. First full day of exercise!  Patient was oriented to gym and equipment including functions, settings, policies, and procedures.  Patient's individual exercise prescription and treatment plan were reviewed.  All starting workloads were established based on the results of the 6 minute walk test done at initial orientation visit.  The plan for exercise progression was also introduced and progression will be customized based on patient's performance and goals. 30 Day review completed. Medical Director ITP review done, changes made as directed, and signed approval by Medical Director. 30 Day review completed. Medical Director ITP review done, changes made as  directed, and signed approval by Medical Director.      Comments: 30 day review ITP

## 2024-04-04 NOTE — Telephone Encounter (Signed)
 Requested Prescriptions  Pending Prescriptions Disp Refills   clopidogrel  (PLAVIX ) 75 MG tablet [Pharmacy Med Name: CLOPIDOGREL  BISULFATE 75 MG TAB] 90 tablet 1    Sig: TAKE 1 TABLET BY MOUTH DAILY     Hematology: Antiplatelets - clopidogrel  Failed - 04/04/2024 10:11 AM      Failed - HGB in normal range and within 180 days    Hemoglobin  Date Value Ref Range Status  02/05/2024 15.1 (H) 12.0 - 15.0 g/dL Final  93/80/7974 85.5 12.0 - 15.0 g/dL Final  98/91/7974 86.3 11.1 - 15.9 g/dL Final         Failed - Cr in normal range and within 360 days    Creatinine  Date Value Ref Range Status  11/02/2023 0.82 0.44 - 1.00 mg/dL Final   Creat  Date Value Ref Range Status  01/19/2017 0.78 0.60 - 0.93 mg/dL Final    Comment:    For patients >12 years of age, the reference limit for Creatinine is approximately 13% higher for people identified as African-American. .    Creatinine, Ser  Date Value Ref Range Status  02/05/2024 1.08 (H) 0.44 - 1.00 mg/dL Final         Passed - HCT in normal range and within 180 days    HCT  Date Value Ref Range Status  02/05/2024 44.9 36.0 - 46.0 % Final   Hematocrit  Date Value Ref Range Status  05/24/2023 41.1 34.0 - 46.6 % Final         Passed - PLT in normal range and within 180 days    Platelets  Date Value Ref Range Status  02/05/2024 288 150 - 400 K/uL Final  05/24/2023 233 150 - 450 x10E3/uL Final   Platelet Count  Date Value Ref Range Status  11/02/2023 224 150 - 400 K/uL Final         Passed - Valid encounter within last 6 months    Recent Outpatient Visits           1 month ago NSTEMI (non-ST elevated myocardial infarction) [facility])   [place] [facility] [place], Jon HERO, MD   2 months ago Encounter for annual physical exam   [place] [facility] [place], Jon HERO, MD   3 months ago Diverticulitis   [facility] [place], Lauraine SAILOR, DO   8 months ago  Diverticulitis   [place] [facility] [place], Jon HERO, MD       Future Appointments             In 1 month Gollan, Timothy J, MD [place] [facility]             metoprolol  tartrate (LOPRESSOR ) 25 MG tablet [Pharmacy Med Name: METOPROLOL  TARTRATE 25 MG TAB] 90 tablet 1    Sig: TAKE 1/2 TABLET TWICE A DAY     Cardiovascular:  Beta Blockers Passed - 04/04/2024 10:11 AM      Passed - Last BP in normal range    BP Readings from Last 1 Encounters:  03/27/24 115/62         Passed - Last Heart Rate in normal range    Pulse Readings from Last 1 Encounters:  03/27/24 70         Passed - Valid encounter within last 6 months    Recent Outpatient Visits           1 month ago NSTEMI (non-ST elevated myocardial infarction) (HCC)  [place] [facility] [place], Jon HERO, MD   2 months ago Encounter for annual physical exam   [facility], [facility] HERO, MD   3 months ago Diverticulitis   [facility] [place], Lauraine SAILOR, DO   8 months ago Diverticulitis   [place] [facility] [place], Jon HERO, MD       Future Appointments             In 1 month Gollan, Evalene PARAS, MD [place] [facility]             atorvastatin  (LIPITOR ) 80 MG tablet [Pharmacy Med Name: ATORVASTATIN  CALCIUM  80 MG TAB] 30 tablet 1    Sig: TAKE 1 TABLET BY MOUTH DAILY     Cardiovascular:  Antilipid - Statins Failed - 04/04/2024 10:11 AM      Failed - Lipid Panel in normal range within the last 12 months    Cholesterol, Total  Date Value Ref Range Status  01/11/2024 217 (H) 100 - 199 mg/dL Final   Cholesterol  Date Value Ref Range Status  01/24/2024 189 0 - 200 mg/dL Final   LDL Chol Calc (NIH)  Date Value Ref Range Status  01/11/2024 124 (H) 0 - 99 mg/dL Final   LDL Cholesterol  Date Value Ref Range Status  01/24/2024 119 (H) 0 - 99 mg/dL Final     Comment:           Total Cholesterol/HDL:CHD Risk Coronary Heart Disease Risk Table                     Men   Women  1/2 Average Risk   3.4   3.3  Average Risk       5.0   4.4  2 X Average Risk   9.6   7.1  3 X Average Risk  23.4   11.0        Use the calculated Patient Ratio above and the CHD Risk Table to determine the patient's CHD Risk.        ATP III CLASSIFICATION (LDL):  <100     mg/dL   Optimal  899-870  mg/dL   Near or Above                    Optimal  130-159  mg/dL   Borderline  839-810  mg/dL   High  >809     mg/dL   Very High Performed at [facility], [address] [place], [place] [address]    HDL  Date Value Ref Range Status  01/24/2024 60 >40 mg/dL Final  91/71/7974 71 >60 mg/dL Final   Triglycerides  Date Value Ref Range Status  01/24/2024 49 <150 mg/dL Final         Passed - Patient is not pregnant      Passed - Valid encounter within last 12 months    Recent Outpatient Visits           1 month ago NSTEMI (non-ST elevated myocardial infarction) [facility])   [place] [facility] [place], Jon HERO, MD   2 months ago Encounter for annual physical exam   [facility] [place], Jon HERO, MD   3 months ago Diverticulitis   [facility] [place], Lauraine SAILOR, DO   8 months ago Diverticulitis   [facility] [place], Jon HERO, MD  Future Appointments             In 1 month Gollan, Timothy J, MD [facility]

## 2024-04-05 ENCOUNTER — Encounter

## 2024-04-08 ENCOUNTER — Encounter

## 2024-04-08 DIAGNOSIS — I5032 Chronic diastolic (congestive) heart failure: Secondary | ICD-10-CM | POA: Diagnosis not present

## 2024-04-08 DIAGNOSIS — I214 Non-ST elevation (NSTEMI) myocardial infarction: Secondary | ICD-10-CM | POA: Diagnosis not present

## 2024-04-08 NOTE — Progress Notes (Signed)
 Daily Session Note  Patient Details  Name: Lauren Lloyd MRN: [id number] Date of Birth: 10/14/1940 Referring Provider:   Flowsheet Row Cardiac Rehab from 02/14/2024 in [facility] Cardiac and Pulmonary Rehab  Referring Provider Dr. Alm Clay    Encounter Date: 04/08/2024  Check In:  Session Check In - 04/08/24 0931       Check-In   Supervising physician immediately available to respond to emergencies See telemetry face sheet for immediately available ER MD    Location [facility]-Cardiac & Pulmonary Rehab    Staff Present Burnard Davenport RN,BSN,MPA;Joseph [facility] RCP,RRT,BSRT;Maxon Burnell BS, Exercise Physiologist;Laren Orama Dyane BS, ACSM CEP, Exercise Physiologist    Virtual Visit No    Medication changes reported     No    Fall or balance concerns reported    No    Tobacco Cessation No Change    Warm-up and Cool-down Performed on first and last piece of equipment    Resistance Training Performed Yes    VAD Patient? No    PAD/SET Patient? No      Pain Assessment   Currently in Pain? No/denies             Social History   Tobacco Use  Smoking Status Former   Current packs/day: 0.00   Average packs/day: 1 pack/day for 30.0 years (30.0 ttl pk-yrs)   Types: Cigarettes   Start date: 05/17/1959   Quit date: 05/16/1989   Years since quitting: 34.9   Passive exposure: Past  Smokeless Tobacco Never    Goals Met:  Independence with exercise equipment Exercise tolerated well No report of concerns or symptoms today Strength training completed today  Goals Unmet:  Not Applicable  Comments: Pt able to follow exercise prescription today without complaint.  Will continue to monitor for progression.    Dr. Oneil Pinal is Medical Director for [facility] Cardiac Rehabilitation.  Dr. Fuad Aleskerov is Medical Director for [facility] Pulmonary Rehabilitation.

## 2024-04-10 ENCOUNTER — Encounter

## 2024-04-10 DIAGNOSIS — I5032 Chronic diastolic (congestive) heart failure: Secondary | ICD-10-CM | POA: Diagnosis not present

## 2024-04-10 DIAGNOSIS — I214 Non-ST elevation (NSTEMI) myocardial infarction: Secondary | ICD-10-CM | POA: Diagnosis not present

## 2024-04-10 NOTE — Progress Notes (Signed)
 Daily Session Note  Patient Details  Name: Lauren Lloyd MRN: [id number] Date of Birth: [birth year] Referring Provider:   Flowsheet Row Cardiac Rehab from 02/14/2024 in [facility] Cardiac and Pulmonary Rehab  Referring Provider Dr. Alm Clay    Encounter Date: 04/10/2024  Check In:  Session Check In - 04/10/24 0918       Check-In   Supervising physician immediately available to respond to emergencies See telemetry face sheet for immediately available ER MD    Location [facility]-Cardiac & Pulmonary Rehab    Staff Present Burnard Davenport RN,BSN,MPA;Maxon Conetta BS, Exercise Physiologist;Laura Cates RN,BSN;Margaret Best, MS, Exercise Physiologist    Virtual Visit No    Medication changes reported     No    Fall or balance concerns reported    No    Tobacco Cessation No Change    Warm-up and Cool-down Performed on first and last piece of equipment    Resistance Training Performed Yes    VAD Patient? No    PAD/SET Patient? No      Pain Assessment   Currently in Pain? No/denies             Social History   Tobacco Use  Smoking Status Former   Current packs/day: 0.00   Average packs/day: 1 pack/day for 30.0 years (30.0 ttl pk-yrs)   Types: Cigarettes   Start date: 05/17/1959   Quit date: 05/16/1989   Years since quitting: 34.9   Passive exposure: Past  Smokeless Tobacco Never    Goals Met:  Independence with exercise equipment Exercise tolerated well No report of concerns or symptoms today Strength training completed today  Goals Unmet:  Not Applicable  Comments: Pt able to follow exercise prescription today without complaint.  Will continue to monitor for progression.    Dr. Oneil Pinal is Medical Director for [facility] Cardiac Rehabilitation.  Dr. Fuad Aleskerov is Medical Director for [facility] Pulmonary Rehabilitation.

## 2024-04-12 ENCOUNTER — Telehealth: Payer: Self-pay

## 2024-04-12 NOTE — Telephone Encounter (Signed)
 Gave pt a call pt is coming up due for reenrollment on [place]&ME Farxiga  left a HIPAA VM

## 2024-04-15 ENCOUNTER — Other Ambulatory Visit: Payer: Self-pay | Admitting: Cardiovascular Disease

## 2024-04-15 ENCOUNTER — Encounter

## 2024-04-15 DIAGNOSIS — L570 Actinic keratosis: Secondary | ICD-10-CM | POA: Diagnosis not present

## 2024-04-15 DIAGNOSIS — R208 Other disturbances of skin sensation: Secondary | ICD-10-CM | POA: Diagnosis not present

## 2024-04-15 DIAGNOSIS — D2271 Melanocytic nevi of right lower limb, including hip: Secondary | ICD-10-CM | POA: Diagnosis not present

## 2024-04-15 DIAGNOSIS — D2262 Melanocytic nevi of left upper limb, including shoulder: Secondary | ICD-10-CM | POA: Diagnosis not present

## 2024-04-15 DIAGNOSIS — L538 Other specified erythematous conditions: Secondary | ICD-10-CM | POA: Diagnosis not present

## 2024-04-15 DIAGNOSIS — D2272 Melanocytic nevi of left lower limb, including hip: Secondary | ICD-10-CM | POA: Diagnosis not present

## 2024-04-15 DIAGNOSIS — D2261 Melanocytic nevi of right upper limb, including shoulder: Secondary | ICD-10-CM | POA: Diagnosis not present

## 2024-04-15 DIAGNOSIS — L820 Inflamed seborrheic keratosis: Secondary | ICD-10-CM | POA: Diagnosis not present

## 2024-04-15 NOTE — Telephone Encounter (Signed)
 Prescription refill request for Eliquis  received. Indication:afib Last office visit:8/25 Scr: 1.08  9/25 Age:83 Weight:98.4  kg  Prescription refilled

## 2024-04-16 ENCOUNTER — Other Ambulatory Visit (HOSPITAL_COMMUNITY): Payer: Self-pay

## 2024-04-16 ENCOUNTER — Other Ambulatory Visit: Payer: Self-pay

## 2024-04-17 ENCOUNTER — Ambulatory Visit: Admitting: Medical

## 2024-04-17 ENCOUNTER — Encounter

## 2024-04-18 ENCOUNTER — Other Ambulatory Visit: Payer: Self-pay

## 2024-04-18 ENCOUNTER — Encounter: Payer: Self-pay | Admitting: Neurology

## 2024-04-18 ENCOUNTER — Other Ambulatory Visit (HOSPITAL_COMMUNITY): Payer: Self-pay

## 2024-04-18 ENCOUNTER — Encounter

## 2024-04-18 ENCOUNTER — Ambulatory Visit: Payer: Self-pay | Admitting: Neurology

## 2024-04-18 VITALS — BP 117/77 | HR 46 | Ht 67.0 in | Wt 215.5 lb

## 2024-04-18 DIAGNOSIS — R42 Dizziness and giddiness: Secondary | ICD-10-CM | POA: Diagnosis not present

## 2024-04-18 DIAGNOSIS — I214 Non-ST elevation (NSTEMI) myocardial infarction: Secondary | ICD-10-CM | POA: Insufficient documentation

## 2024-04-18 NOTE — Progress Notes (Signed)
 Daily Session Note  Patient Details  Name: Lauren Lloyd MRN: [id number] Date of Birth: [birth year] Referring Provider:   Flowsheet Row Cardiac Rehab from 02/14/2024 in [facility] Cardiac and Pulmonary Rehab  Referring Provider Dr. Alm Clay    Encounter Date: 04/18/2024  Check In:  Session Check In - 04/18/24 0734       Check-In   Supervising physician immediately available to respond to emergencies See telemetry face sheet for immediately available ER MD    Location [facility]-Cardiac & Pulmonary Rehab    Staff Present Leita Franks RN,BSN;Joseph Rolinda RCP,RRT,BSRT;Jason Elnor RDN,LDN;Laureen Delores, BS, RRT, CPFT    Virtual Visit No    Medication changes reported     No    Fall or balance concerns reported    No    Tobacco Cessation No Change    Warm-up and Cool-down Performed on first and last piece of equipment    Resistance Training Performed Yes    VAD Patient? No    PAD/SET Patient? No      Pain Assessment   Currently in Pain? No/denies             Social History   Tobacco Use  Smoking Status Former   Current packs/day: 0.00   Average packs/day: 1 pack/day for 30.0 years (30.0 ttl pk-yrs)   Types: Cigarettes   Start date: 05/17/1959   Quit date: 05/16/1989   Years since quitting: 34.9   Passive exposure: Past  Smokeless Tobacco Never    Goals Met:  Independence with exercise equipment Exercise tolerated well No report of concerns or symptoms today Strength training completed today  Goals Unmet:  Not Applicable  Comments: Pt able to follow exercise prescription today without complaint.  Will continue to monitor for progression.    Dr. Oneil Pinal is Medical Director for [facility] Cardiac Rehabilitation.  Dr. Fuad Aleskerov is Medical Director for [facility] Pulmonary Rehabilitation.

## 2024-04-18 NOTE — Patient Instructions (Addendum)
 MRI brain to rule out structural abnormality that can cause ongoing dizziness Monitor your heart rate at home and provide reading to your cardiologist Follow-up with cardiologist regarding discontinuing metoprolol  Return as needed

## 2024-04-18 NOTE — Progress Notes (Signed)
 [facility]  PATIENT: Lauren Lloyd DOB: April 13, 1941  REQUESTING CLINICIAN: Furth, Cadence H, PA-C HISTORY FROM: Patient  REASON FOR VISIT: Dizziness    HISTORICAL  CHIEF COMPLAINT:  Chief Complaint  Patient presents with   RM12/DIZZINESS    Pt is here Alone. Pt states she has been having dizziness for years. Pt states that her ear pain causes her dizziness.     HISTORY OF PRESENT ILLNESS:  Discussed the use of AI scribe software for clinical note transcription with the patient, who gave verbal consent to proceed.  Lauren Lloyd is an 83 year old female with atrial fibrillation, heart disease with pacemaker placement and heart failure who presents with ongoing dizziness. She was referred by her cardiologist for evaluation of her dizziness.  She has been experiencing dizziness for about a year, describing it as a sensation of being 'like I'm drunk' rather than the room spinning. The dizziness is intermittent and not present during today's visit. She is concerned about the risk of falling, although she has not experienced any falls yet. Dizziness occurs when getting out of bed, and when standing.  She takes precautions by sitting on the side of the bed and holding onto something before standing.  She has a history of atrial fibrillation and heart failure. She is on multiple heart medications and previously stopped taking metoprolol  due to severe fatigue, which required her to stay in bed all day. Stopping metoprolol  allowed her to be more active, she did not inform her cardiologist. She has a pacemaker, implanted in June 2023, and her heart rate has been noted to be as low as 44 beats per minute on today's visit.  She experiences headaches almost all the time and is limited to taking Tylenol  for relief due to her heart condition.  For her ongoing dizziness she did follow-up with ENT and no no etiology was found.  She has a history of being hospitalized for a  reaction to Tikosyn , a heart medication, and was advised against its use. During that hospitalization, she was informed of the need for a pacemaker, and a pacemaker was placed.  She reports fluid intake restrictions due to heart failure, previously drinking eight 8-ounce glasses of water  daily, now reduced to about half that amount.    OTHER MEDICAL CONDITIONS: Atrial fibrillation, heart failure, hyperlipidemia   REVIEW OF SYSTEMS: Full 14 system review of systems performed and negative with exception of: As noted in HPI  ALLERGIES: Allergies  Allergen Reactions   Levofloxacin Other (See Comments)    Other reaction(s): Joint Pains   Influenza Vaccine Recombinant Other (See Comments)   Influenza Vaccines Other (See Comments)    Bell's Palsy   Oysters [Shellfish Allergy] Swelling    She states she had eaten them three days in a row and she developed swelling around her eyes.     HOME MEDICATIONS: Outpatient Medications Prior to Visit  Medication Sig Dispense Refill   allopurinol  (ZYLOPRIM ) 100 MG tablet TAKE ONE TABLET BY MOUTH EVERY DAY 90 tablet 1   atorvastatin  (LIPITOR ) 80 MG tablet TAKE 1 TABLET BY MOUTH DAILY 90 tablet 1   calcium  carbonate (TUMS EX) 750 MG chewable tablet Chew 2 tablets by mouth daily as needed for heartburn.     Cholecalciferol  25 MCG (1000 UT) tablet Take 1,000 Units by mouth daily.     cholestyramine (QUESTRAN) 4 g packet Take 4 g by mouth.     clopidogrel  (PLAVIX ) 75 MG tablet TAKE 1 TABLET  BY MOUTH DAILY 90 tablet 1   dapagliflozin  propanediol (FARXIGA ) 10 MG TABS tablet Take 1 tablet (10 mg total) by mouth daily before breakfast. 30 tablet 6   ELIQUIS  5 MG TABS tablet TAKE ONE TABLET TWICE DAILY 180 tablet 1   ezetimibe  (ZETIA ) 10 MG tablet TAKE 1 TABLET BY MOUTH DAILY 90 tablet 3   furosemide  (LASIX ) 40 MG tablet TAKE 1 TABLET BY MOUTH DAILY. TAKE AN EXTRA TABLET AS NEEDED AFTER LUNCH FOR ABDOMINAL SWELLING, LEG SWELLING OR SHORTNESS OF BREATH 180 tablet  1   gabapentin  (NEURONTIN ) 600 MG tablet Take 1 tablet (600 mg total) by mouth at bedtime. 90 tablet 3   lactobacillus acidophilus (BACID) TABS tablet Take 1-2 tablets by mouth 2 (two) times daily. 2 AM AND 1 HS     Magnesium  300 MG CAPS Take 2 capsules by mouth daily.     Menthol, Topical Analgesic, (BIOFREEZE EX) Apply 1 application. topically daily as needed (Neck pain).     metaxalone  (SKELAXIN ) 800 MG tablet Take 1 tablet (800 mg total) by mouth daily as needed for muscle spasms. 30 tablet 2   Multiple Vitamin (MULTIVITAMIN) capsule Take 1 capsule by mouth daily.     mupirocin  ointment (BACTROBAN ) 2 % Place 1 application  into the nose 2 (two) times daily. 22 g 0   nitroGLYCERIN  (NITROSTAT ) 0.4 MG SL tablet Place 1 tablet (0.4 mg total) under the tongue every 5 (five) minutes as needed for chest pain. 30 tablet 12   potassium chloride  (KLOR-CON ) 10 MEQ tablet Take 2 tablets (20 mEq total) by mouth daily. 60 tablet 10   Tafamidis  (VYNDAMAX ) 61 MG CAPS Take 1 capsule (61 mg total) by mouth daily. 30 capsule 11   Tiotropium Bromide -Olodaterol (STIOLTO RESPIMAT ) 2.5-2.5 MCG/ACT AERS Inhale 2 puffs into the lungs daily as needed. 3 each 4   metoprolol  tartrate (LOPRESSOR ) 25 MG tablet TAKE 1/2 TABLET TWICE A DAY (Patient not taking: Reported on 04/18/2024) 90 tablet 1   No facility-administered medications prior to visit.    PAST MEDICAL HISTORY: Past Medical History:  Diagnosis Date   (HFpEF) heart failure with preserved ejection fraction (HCC)    a. 05/2018 Echo: EF 55-60%, no rwma, mild to mod MR. Nl RV fxn. Mod TR. PASP .   Arthritis    knees, Hands   Arthritis of knee    Back pain    Carotid arterial disease    a. 03/2019 Carotid U/S: <50% bilat ICA stenoses.   CHF (congestive heart failure) (HCC)    Cholelithiasis    a. 10/2018 noted on CT.   Edema, lower extremity    Fatty liver    GERD (gastroesophageal reflux disease)    History of stress test    a. 06/2018 MV: EF 59%,  no ischemia/infarct. Low risk.   Hypertension    Knee pain    Lactose intolerance    Mitral regurgitation    a. 05/2018 Echo: mild to mod MR.   Multinodular goiter    Obesity    OSA (obstructive sleep apnea)    PAF (paroxysmal atrial fibrillation) (HCC)    a.  Diagnosed 12/19; b. 05/2018 s/p DCCV; c. 03/2019 & 05/2019 recurrent AFib-->managed w/ amio load; d. CHADS2VASc = 6 (CHF, HTN, age x 2, vascular disease, female)-->Eliquis  & amio 100 qd.   PAH (pulmonary artery hypertension) (HCC)    RSV (acute bronchiolitis due to respiratory syncytial virus) 05/10/2022   Scoliosis    SOB (shortness of breath)  Swallowing difficulty     PAST SURGICAL HISTORY: Past Surgical History:  Procedure Laterality Date   AV NODE ABLATION N/A 07/05/2022   Procedure: AV NODE ABLATION;  Surgeon: Cindie Ole DASEN, MD;  Location: [facility] INVASIVE CV LAB;  Service: Cardiovascular;  Laterality: N/A;   CARDIOVERSION N/A 06/15/2018   Procedure: CARDIOVERSION (CATH LAB);  Surgeon: Perla Evalene PARAS, MD;  Location: [facility] ORS;  Service: Cardiovascular;  Laterality: N/A;   CARDIOVERSION N/A 02/18/2021   Procedure: CARDIOVERSION;  Surgeon: Perla Evalene PARAS, MD;  Location: [facility] ORS;  Service: Cardiovascular;  Laterality: N/A;   CATARACT EXTRACTION W/PHACO Right 01/25/2016   Procedure: CATARACT EXTRACTION PHACO AND INTRAOCULAR LENS PLACEMENT (IOC);  Surgeon: Donzell Arlyce Budd, MD;  Location: [facility]ERY [facility];  Service: Ophthalmology;  Laterality: Right;  RIGHT   CATARACT EXTRACTION W/PHACO Left 02/22/2016   Procedure: CATARACT EXTRACTION PHACO AND INTRAOCULAR LENS PLACEMENT (IOC);  Surgeon: Donzell Arlyce Budd, MD;  Location: [facility] SURGERY [facility];  Service: Ophthalmology;  Laterality: Left;  LEFT   COLONOSCOPY N/A 01/22/2024   Procedure: COLONOSCOPY;  Surgeon: Therisa Bi, MD;  Location: [facility] ENDOSCOPY;  Service: Gastroenterology;  Laterality: N/A;   HAMMER TOE SURGERY  05/16/2008   KNEE ARTHROSCOPY Right  05/16/2002   LEFT HEART CATH AND CORONARY ANGIOGRAPHY N/A 01/25/2024   Procedure: LEFT HEART CATH AND CORONARY ANGIOGRAPHY;  Surgeon: Anner Alm ORN, MD;  Location: [facility] INVASIVE CV LAB;  Service: Cardiovascular;  Laterality: N/A;   PACEMAKER IMPLANT N/A 10/25/2021   Procedure: PACEMAKER IMPLANT;  Surgeon: Cindie Ole DASEN, MD;  Location: [facility] INVASIVE CV LAB;  Service: Cardiovascular;  Laterality: N/A;   PARTIAL HIP ARTHROPLASTY Left 12/17/2021   POLYPECTOMY  01/22/2024   Procedure: POLYPECTOMY, INTESTINE;  Surgeon: Therisa Bi, MD;  Location: [facility] ENDOSCOPY;  Service: Gastroenterology;;   REPLACEMENT TOTAL KNEE Right 05/17/2007   [facility]   SKIN GRAFT Left 04/08/2013   Done on left index finger   TONSILLECTOMY  05/16/1944   TOTAL KNEE ARTHROPLASTY Left 12/02/2020    FAMILY HISTORY: Family History  Problem Relation Age of Onset   Hyperlipidemia Sister    Atrial fibrillation Sister    Transient ischemic attack Mother    Heart disease Mother    Heart attack Father    High blood pressure Father    Stroke Father    Healthy Brother    Breast cancer Sister 43   Hyperlipidemia Sister    Atrial fibrillation Sister     SOCIAL HISTORY: Social History   Socioeconomic History   Marital status: Single    Spouse name: Not on file   Number of children: 5   Years of education: college   Highest education level: Bachelor's degree (e.g., BA, AB, BS)  Occupational History   Occupation: Lawyer: [employer]  Tobacco Use   Smoking status: Former    Current packs/day: 0.00    Average packs/day: 1 pack/day for 30.0 years (30.0 ttl pk-yrs)    Types: Cigarettes    Start date: 05/17/1959    Quit date: 05/16/1989    Years since quitting: 34.9    Passive exposure: Past   Smokeless tobacco: Never  Vaping Use   Vaping status: Never Used  Substance and Sexual Activity   Alcohol use: Yes    Alcohol/week: 14.0 standard drinks of alcohol     Types: 14 Glasses of wine per week   Drug use: No   Sexual activity: Not Currently  Other Topics Concern  Not on file  Social History Narrative   Pt has a child who passed away at age 70   Social Drivers of Health   Financial Resource Strain: Low Risk  (01/17/2024)   Overall Financial Resource Strain (CARDIA)    Difficulty of Paying Living Expenses: Not hard at all  Food Insecurity: No Food Insecurity (01/24/2024)   Hunger Vital Sign    Worried About Running Out of Food in the Last Year: Never true    Ran Out of Food in the Last Year: Never true  Transportation Needs: No Transportation Needs (01/24/2024)   PRAPARE - Administrator, Civil Service (Medical): No    Lack of Transportation (Non-Medical): No  Physical Activity: Inactive (01/17/2024)   Exercise Vital Sign    Days of Exercise per Week: 0 days    Minutes of Exercise per Session: 0 min  Stress: No Stress Concern Present (01/17/2024)   [employer] of Occupational Health - Occupational Stress Questionnaire    Feeling of Stress: Not at all  Social Connections: Moderately Integrated (01/24/2024)   Social Connection and Isolation Panel    Frequency of Communication with Friends and Family: More than three times a week    Frequency of Social Gatherings with Friends and Family: More than three times a week    Attends Religious Services: More than 4 times per year    Active Member of Clubs or Organizations: Yes    Attends Banker Meetings: More than 4 times per year    Marital Status: Divorced  Intimate Partner Violence: Not At Risk (01/24/2024)   Humiliation, Afraid, Rape, and Kick questionnaire    Fear of Current or Ex-Partner: No    Emotionally Abused: No    Physically Abused: No    Sexually Abused: No    PHYSICAL EXAM  GENERAL EXAM/CONSTITUTIONAL: Vitals:  Vitals:   04/18/24 0947 04/18/24 0948 04/18/24 0950 04/18/24 0952  BP: (!) 123/57 113/69 (!) 144/61 117/77  Pulse: (!) 44 63 (!) 55 (!) 46   SpO2: 96% 96% 98% 98%  Weight:      Height:       Body mass index is 33.75 kg/m. Wt Readings from Last 3 Encounters:  04/18/24 215 lb 8 oz (97.8 kg)  03/27/24 217 lb (98.4 kg)  02/14/24 215 lb 3.2 oz (97.6 kg)   Patient is in no distress; well developed, nourished and groomed; neck is supple  MUSCULOSKELETAL: Gait, strength, tone, movements noted in Neurologic exam below  NEUROLOGIC: MENTAL STATUS:      No data to display         awake, alert, oriented to person, place and time recent and remote memory intact normal attention and concentration language fluent, comprehension intact, naming intact fund of knowledge appropriate  CRANIAL NERVE:  2nd, 3rd, 4th, 6th - Visual fields full to confrontation, extraocular muscles intact, no nystagmus 5th - facial sensation symmetric 7th - facial strength symmetric 8th - hearing intact 9th - palate elevates symmetrically, uvula midline 11th - shoulder shrug symmetric 12th - tongue protrusion midline  MOTOR:  normal bulk and tone, full strength in the BUE, BLE  SENSORY:  normal and symmetric to light touch  COORDINATION:  finger-nose-finger, fine finger movements normal  GAIT/STATION:  normal   DIAGNOSTIC DATA (LABS, IMAGING, TESTING) - I reviewed patient records, labs, notes, testing and imaging myself where available.  Lab Results  Component Value Date   WBC 8.1 02/05/2024   HGB 15.1 (H) 02/05/2024  HCT 44.9 02/05/2024   MCV 93.9 02/05/2024   PLT 288 02/05/2024      Component Value Date/Time   NA 139 02/05/2024 1448   NA 144 05/24/2023 1448   K 3.4 (L) 02/05/2024 1448   CL 103 02/05/2024 1448   CO2 24 02/05/2024 1448   GLUCOSE 89 02/05/2024 1448   BUN 25 (H) 02/05/2024 1448   BUN 28 (H) 05/24/2023 1448   CREATININE 1.08 (H) 02/05/2024 1448   CREATININE 0.82 11/02/2023 1135   CREATININE 0.78 01/19/2017 1522   CALCIUM  8.7 (L) 02/05/2024 1448   PROT 7.1 01/11/2024 1037   ALBUMIN 4.3 01/11/2024 1037    AST 30 01/11/2024 1037   ALT 27 01/11/2024 1037   ALKPHOS 141 (H) 01/11/2024 1037   BILITOT 0.7 01/11/2024 1037   GFRNONAA 51 (L) 02/05/2024 1448   GFRNONAA >60 11/02/2023 1135   GFRNONAA 73 01/19/2017 1522   GFRAA 75 12/09/2019 1411   GFRAA 85 01/19/2017 1522   Lab Results  Component Value Date   CHOL 189 01/24/2024   HDL 60 01/24/2024   LDLCALC 119 (H) 01/24/2024   TRIG 49 01/24/2024   CHOLHDL 3.2 01/24/2024   Lab Results  Component Value Date   HGBA1C 5.5 01/11/2024   Lab Results  Component Value Date   VITAMINB12 1,246 (H) 06/16/2021   Lab Results  Component Value Date   TSH 1.020 08/03/2020      ASSESSMENT AND PLAN  83 y.o. year old female with history of heart failure, heart disease with pacemaker placement, atrial fibrillation who is presenting with ongoing dizziness for the past year  Chronic dizziness Approximately one year, described as a sensation of spinning, similar to being drunk. No falls reported. Differential diagnosis neurological considerations space-occupying lesion, cardiac causes, noted to be bradycardic at 43 even though she does have a pacemaker, medication side effects, and deconditioning. ENT evaluation ruled out inner ear disease. Blood pressure is well-controlled, but low heart rate is a concern. Dizziness may be multifactorial, potentially related to medication effects as she noted the metoprolol  made her unable to get out of bed therefore she self discontinued. - Ordered MRI of the brain to rule out stroke or brain tumor. - Advised monitoring heart rate with a smartwatch and reporting findings to cardiologist. - Instructed to follow up with cardiologist regarding medication review and pacemaker function.  Bradycardia with cardiac pacemaker Bradycardia with heart rate as low as 44 bpm despite having a pacemaker. Pacemaker was placed in June 2023. Low heart rate may contribute to dizziness. - Instructed to use smartwatch to monitor heart rate  and provide data to cardiologist.    1. Dizziness      Patient Instructions  MRI brain to rule out structural abnormality that can cause ongoing dizziness Monitor your heart rate at home and provide reading to your cardiologist Follow-up with cardiologist regarding discontinuing metoprolol  Return as needed  Orders Placed This Encounter  Procedures   MR BRAIN WO CONTRAST    No orders of the defined types were placed in this encounter.   Return if symptoms worsen or fail to improve.   Pastor Falling, MD 04/18/2024, 12:31 PM  [facility] [address] [place], [place] [address] [phone]

## 2024-04-19 ENCOUNTER — Encounter

## 2024-04-22 ENCOUNTER — Telehealth: Payer: Self-pay

## 2024-04-22 ENCOUNTER — Encounter

## 2024-04-22 NOTE — Telephone Encounter (Unsigned)
 Copied from CRM #[id number]. Topic: Clinical - Prescription Issue >> Apr 19, 2024  1:53 PM Amy B wrote: Reason for CRM:  [employer] [facility] requires a refill request for the medication dapagliflozin  propanediol (FARXIGA ) 10 MG TABS tablet.  Please fax to [phone].  They will then mail the medication to the patient.

## 2024-04-22 NOTE — Telephone Encounter (Signed)
 We can send the request to the prescribing MD as they will need to send it to the company.

## 2024-04-23 ENCOUNTER — Other Ambulatory Visit: Payer: Self-pay

## 2024-04-23 NOTE — Progress Notes (Signed)
 Specialty Pharmacy Ongoing Clinical Assessment Note  Lauren Lloyd is a 83 y.o. female who is being followed by the specialty pharmacy service for RxSp Cardiology   Patient's specialty medication(s) reviewed today: Tafamidis  (Vyndamax )   Missed doses in the last 4 weeks: 0   Patient/Caregiver did not have any additional questions or concerns.   Therapeutic benefit summary: Patient is achieving benefit   Adverse events/side effects summary: No adverse events/side effects   Patient's therapy is appropriate to: Continue    Goals Addressed             This Visit's Progress    Slow Disease Progression       Patient is on track. Patient will maintain adherence          Follow up: 12 months  Neri Samek M Rhema Boyett Specialty Pharmacist

## 2024-04-23 NOTE — Progress Notes (Signed)
 Specialty Pharmacy Refill Coordination Note  Lauren Lloyd is a 83 y.o. female contacted today regarding refills of specialty medication(s) Tafamidis  (Vyndamax )   Patient requested Delivery   Delivery date: 04/26/24   Verified address: [address] [place] [address]   Medication will be filled on: 04/25/24

## 2024-04-23 NOTE — Telephone Encounter (Signed)
 We can refill, should be seeing her in clinic next week.

## 2024-04-24 ENCOUNTER — Ambulatory Visit: Payer: Medicare HMO

## 2024-04-24 ENCOUNTER — Encounter

## 2024-04-24 DIAGNOSIS — I4821 Permanent atrial fibrillation: Secondary | ICD-10-CM | POA: Diagnosis not present

## 2024-04-24 NOTE — Telephone Encounter (Signed)
 Spoke with pt today explain she call [employer] for re-enrollment 2026 pt is approved thru 05/15/2025.

## 2024-04-25 ENCOUNTER — Telehealth: Payer: Self-pay | Admitting: Cardiovascular Disease

## 2024-04-25 ENCOUNTER — Other Ambulatory Visit: Payer: Self-pay

## 2024-04-25 LAB — CUP PACEART REMOTE DEVICE CHECK
Battery Remaining Longevity: 138 mo
Battery Remaining Percentage: 100 %
Brady Statistic RA Percent Paced: 0 %
Brady Statistic RV Percent Paced: 96 %
Date Time Interrogation Session: 20251210041200
Implantable Lead Connection Status: 753985
Implantable Lead Connection Status: 753985
Implantable Lead Implant Date: 20230612
Implantable Lead Implant Date: 20230612
Implantable Lead Location: 753859
Implantable Lead Location: 753860
Implantable Lead Model: 7841
Implantable Lead Model: 7842
Implantable Lead Serial Number: 1182863
Implantable Lead Serial Number: 1274488
Implantable Pulse Generator Implant Date: 20230612
Lead Channel Impedance Value: 528 Ohm
Lead Channel Impedance Value: 668 Ohm
Lead Channel Pacing Threshold Amplitude: 0.9 V
Lead Channel Pacing Threshold Pulse Width: 0.4 ms
Lead Channel Setting Pacing Amplitude: 1.4 V
Lead Channel Setting Pacing Pulse Width: 0.4 ms
Lead Channel Setting Sensing Sensitivity: 3.5 mV
Pulse Gen Serial Number: 111954
Zone Setting Status: 755011

## 2024-04-25 NOTE — Telephone Encounter (Signed)
 Patient called returned.  Patient asked if she can take Metaxalone  800 mg for back spasms without any adverse effects from all of her other medications.  Informed the patient that her message will be routed to Dr. Gollan for input, but since it is already past 5 pm, we may not get back to her with a response earlier than tomorrow.  Advised patient that if she wanted immediate answer, to call the pharmacy she picked up the medication from and ask the pharmacist there if it is ok for her to take the medication.  Patient verbalized agreement with the suggestion.  All other questions and concerns addressed at this time.

## 2024-04-25 NOTE — Telephone Encounter (Signed)
 Patient calling in to see if she is able to take a muscle relaxer with her heart condition. She states that she is having back problems. Please advise

## 2024-04-26 ENCOUNTER — Emergency Department

## 2024-04-26 ENCOUNTER — Telehealth (HOSPITAL_COMMUNITY): Payer: Self-pay | Admitting: Cardiology

## 2024-04-26 ENCOUNTER — Ambulatory Visit: Payer: Self-pay | Admitting: *Deleted

## 2024-04-26 ENCOUNTER — Other Ambulatory Visit: Payer: Self-pay

## 2024-04-26 ENCOUNTER — Emergency Department
Admission: EM | Admit: 2024-04-26 | Discharge: 2024-04-26 | Disposition: A | Discharge status: Home / Self Care | Attending: Emergency Medicine | Admitting: Emergency Medicine

## 2024-04-26 ENCOUNTER — Encounter

## 2024-04-26 DIAGNOSIS — R079 Chest pain, unspecified: Secondary | ICD-10-CM | POA: Diagnosis not present

## 2024-04-26 DIAGNOSIS — E041 Nontoxic single thyroid nodule: Secondary | ICD-10-CM | POA: Diagnosis not present

## 2024-04-26 DIAGNOSIS — I509 Heart failure, unspecified: Secondary | ICD-10-CM | POA: Insufficient documentation

## 2024-04-26 DIAGNOSIS — M546 Pain in thoracic spine: Secondary | ICD-10-CM | POA: Insufficient documentation

## 2024-04-26 DIAGNOSIS — Z7901 Long term (current) use of anticoagulants: Secondary | ICD-10-CM | POA: Insufficient documentation

## 2024-04-26 DIAGNOSIS — R0602 Shortness of breath: Secondary | ICD-10-CM | POA: Diagnosis not present

## 2024-04-26 DIAGNOSIS — I482 Chronic atrial fibrillation, unspecified: Secondary | ICD-10-CM | POA: Diagnosis not present

## 2024-04-26 DIAGNOSIS — R7989 Other specified abnormal findings of blood chemistry: Secondary | ICD-10-CM | POA: Diagnosis not present

## 2024-04-26 DIAGNOSIS — Z950 Presence of cardiac pacemaker: Secondary | ICD-10-CM | POA: Insufficient documentation

## 2024-04-26 DIAGNOSIS — R0789 Other chest pain: Secondary | ICD-10-CM | POA: Diagnosis not present

## 2024-04-26 LAB — CBC
HCT: 43.9 % (ref 36.0–46.0)
Hemoglobin: 14.6 g/dL (ref 12.0–15.0)
MCH: 31.1 pg (ref 26.0–34.0)
MCHC: 33.3 g/dL (ref 30.0–36.0)
MCV: 93.4 fL (ref 80.0–100.0)
Platelets: 250 K/uL (ref 150–400)
RBC: 4.7 MIL/uL (ref 3.87–5.11)
RDW: 12.8 % (ref 11.5–15.5)
WBC: 10 K/uL (ref 4.0–10.5)
nRBC: 0 % (ref 0.0–0.2)

## 2024-04-26 LAB — BASIC METABOLIC PANEL WITH GFR
Anion gap: 15 (ref 5–15)
BUN: 20 mg/dL (ref 8–23)
CO2: 25 mmol/L (ref 22–32)
Calcium: 9.2 mg/dL (ref 8.9–10.3)
Chloride: 98 mmol/L (ref 98–111)
Creatinine, Ser: 0.95 mg/dL (ref 0.44–1.00)
GFR, Estimated: 59 mL/min — ABNORMAL LOW (ref >60)
Glucose, Bld: 113 mg/dL — ABNORMAL HIGH (ref 70–99)
Potassium: 3.8 mmol/L (ref 3.5–5.1)
Sodium: 137 mmol/L (ref 135–145)

## 2024-04-26 LAB — TROPONIN T, HIGH SENSITIVITY
Troponin T High Sensitivity: 30 ng/L — ABNORMAL HIGH (ref 0–19)
Troponin T High Sensitivity: 27 ng/L — ABNORMAL HIGH (ref 0–19)

## 2024-04-26 LAB — PRO BRAIN NATRIURETIC PEPTIDE: Pro Brain Natriuretic Peptide: 1685 pg/mL — ABNORMAL HIGH (ref <300.0)

## 2024-04-26 MED ORDER — OXYCODONE HCL 5 MG PO TABS: 5.0000 mg | ORAL_TABLET | Freq: Three times a day (TID) | ORAL | 0 refills | Status: AC | PRN

## 2024-04-26 MED ORDER — MORPHINE SULFATE (PF) 4 MG/ML IV SOLN
4.0000 mg | Freq: Once | INTRAVENOUS | Status: AC
  Administered 2024-04-26: 4 mg via INTRAVENOUS
  Filled 2024-04-26: qty 1

## 2024-04-26 MED ORDER — FUROSEMIDE 10 MG/ML IJ SOLN: 20.0000 mg | Freq: Once | INTRAMUSCULAR | Status: DC

## 2024-04-26 MED ORDER — LIDOCAINE 5 % EX PTCH: 1.0000 | MEDICATED_PATCH | CUTANEOUS | 0 refills | Status: AC

## 2024-04-26 MED ORDER — IOHEXOL 350 MG/ML SOLN
75.0000 mL | Freq: Once | INTRAVENOUS | Status: AC | PRN
  Administered 2024-04-26: 75 mL via INTRAVENOUS

## 2024-04-26 MED ORDER — FUROSEMIDE 10 MG/ML IJ SOLN
40.0000 mg | Freq: Once | INTRAMUSCULAR | Status: AC
  Administered 2024-04-26: 40 mg via INTRAVENOUS
  Filled 2024-04-26: qty 4

## 2024-04-26 NOTE — Telephone Encounter (Addendum)
 Called patient to advise that she may take the muscle relaxant for her back pain - she stated she had just been dc from the ER for pain

## 2024-04-26 NOTE — Telephone Encounter (Signed)
 Called to confirm/remind patient of their appointment [facility] on 04/26/24.   Appointment:   [] Confirmed  [x] Left mess   [] No answer/No voice mail  [] VM Full/unable to leave message  [] Phone not in service  Patient reminded to bring all medications and/or complete list.  Confirmed patient has transportation. Gave directions, instructed to utilize valet parking.

## 2024-04-26 NOTE — ED Provider Notes (Signed)
 [facility] Provider Note    Event Date/Time   First MD Initiated Contact with Patient 04/26/24 1109     (approximate)   History   Chest Pain   HPI  Lauren ARSENEAU is a 83 y.o. female with history of atrial fibrillation, CHF, chronic pain, IBS, on Eliquis , status post pacemaker, presenting with bilateral anterior chest and left upper back pain.  States that the pain is nonradiating, not tearing down her back.  States that started 2 days ago.  Thought it was related to musculoskeletal pain and took prescribed muscle relaxers without improvement.  States that she has been having shortness of breath for a while, felt that it has been getting worse and last several days as well.  No fever or cough.  No focal weakness or numbness or incontinence.  No recent trauma or falls.  Does state that she has bilateral hand tingling that is new.  States has been compliant with her medications including her Eliquis .  She is not complaining about dizziness today.    On independent chart review patient was seen by neurology in early December, has history of hospitalization for reaction to Tikosyn , was seen there for chronic dizziness.  Has history of bradycardia, status post pacemaker.  Physical Exam   Triage Vital Signs: ED Triage Vitals  Encounter Vitals Group     BP 04/26/24 1047 (!) 147/64     Girls Systolic BP Percentile --      Girls Diastolic BP Percentile --      Boys Systolic BP Percentile --      Boys Diastolic BP Percentile --      Pulse Rate 04/26/24 1047 66     Resp 04/26/24 1047 16     Temp 04/26/24 1047 98.5 F (36.9 C)     Temp Source 04/26/24 1047 Oral     SpO2 04/26/24 1047 97 %     Weight 04/26/24 1046 215 lb 6.2 oz (97.7 kg)     Height --      Head Circumference --      Peak Flow --      Pain Score 04/26/24 1045 5     Pain Loc --      Pain Education --      Exclude from Growth Chart --     Most recent vital signs: Vitals:   04/26/24 1047  04/26/24 1136  BP: (!) 147/64   Pulse: 66   Resp: 16   Temp: 98.5 F (36.9 C)   SpO2: 97% 98%     General: Awake, no distress. CV:  Good peripheral perfusion.  Resp:  Normal effort.  Clear, no tachypnea or respiratory distress Abd:  No distention.  Soft nontender Other:  No focal weakness or numbness, no saddle anesthesia, no midline spinal tenderness, she does not have any unilateral calf swelling or tenderness   ED Results / Procedures / Treatments   Labs (all labs ordered are listed, but only abnormal results are displayed) Labs Reviewed  BASIC METABOLIC PANEL WITH GFR - Abnormal; Notable for the following components:      Result Value   Glucose, Bld 113 (*)    GFR, Estimated 59 (*)    All other components within normal limits  PRO BRAIN NATRIURETIC PEPTIDE - Abnormal; Notable for the following components:   Pro Brain Natriuretic Peptide 1,685.0 (*)    All other components within normal limits  TROPONIN T, HIGH SENSITIVITY - Abnormal; Notable for the following  components:   Troponin T High Sensitivity 30 (*)    All other components within normal limits  TROPONIN T, HIGH SENSITIVITY - Abnormal; Notable for the following components:   Troponin T High Sensitivity 27 (*)    All other components within normal limits  CBC     EKG  EKG shows, V paced rhythm, rate 71, widened QRS, normal QTc, does not meet Sgarbossa's criteria, baseline is wandering due to patient movement, not significant change compared to prior   RADIOLOGY On my independent interpretation, chest x-ray without obvious consolidation   PROCEDURES:  Critical Care performed: No  Procedures   MEDICATIONS ORDERED IN ED: Medications  morphine  (PF) 4 MG/ML injection 4 mg (4 mg Intravenous Given 04/26/24 1157)  iohexol  (OMNIPAQUE ) 350 MG/ML injection 75 mL (75 mLs Intravenous Contrast Given 04/26/24 1204)  furosemide  (LASIX ) injection 40 mg (40 mg Intravenous Given 04/26/24 1326)     IMPRESSION /  MDM / ASSESSMENT AND PLAN / ED COURSE  I reviewed the triage vital signs and the nursing notes.                              Differential diagnosis includes, but is not limited to, angina, ACS, CHF, arrhythmia, did consider PE the patient is compliant with her Eliquis , not hypoxic or tachycardic, did also consider dissection given the tingling in the chest and back pain.  Will get a CT angio chest, labs, EKG, troponin, chest x-ray.  Will give her some pain meds here.  Patient's presentation is most consistent with acute presentation with potential threat to life or bodily function.  Independent interpretation of labs and imaging below.  On reassessment patient is feeling a lot better.  Discussed with her and family about imaging and lab results, incidental findings.  Will give her a dose of IV Lasix  here given that her BNP is elevated and CT imaging shows signs of venous congestion.  Did also discuss the incidental finding of a thyroid  nodule.  Will have her follow-up primary care for further management.  Did discuss with her about taking Tylenol  as needed for pain, will prescribe her some Lidoderm  patches as well as oxycodone  for severe breakthrough pain.  Did discuss with her about not driving when on the oxycodone  and to try to take it at night since it can be sedating.  She has a follow-up ointment with heart failure clinic on Monday.  Did discuss with her about following up with them.  Otherwise considered but no indication for inpatient admission at this time, she safe for outpatient management.  Will discharge with strict return precautions.  The patient is on the cardiac monitor to evaluate for evidence of arrhythmia and/or significant heart rate changes.   Clinical Course as of 04/26/24 1403  Fri Apr 26, 2024  1150 Independent review of labs, troponins mildly elevated, electrolytes not severely deranged, no leukocytosis. [TT]  1151 DG Chest 2 View No active cardiopulmonary disease.  [TT]   1316 CT Angio Chest Aorta W and/or Wo Contrast IMPRESSION: No evidence of pulmonary embolism or acute aortic syndrome. No other acute findings.  Mild cardiomegaly and evidence of right heart insufficiency.  Stable 3.9 cm right thyroid  lobe nodule.  Cholelithiasis.   [TT]  1402 Troponin T, High Sensitivity(!) Downtrending [TT]    Clinical Course User Index [TT] Waymond Lorelle Cummins, MD     FINAL CLINICAL IMPRESSION(S) / ED DIAGNOSES   Final diagnoses:  Nonspecific  chest pain  Acute left-sided thoracic back pain  Shortness of breath  Elevated brain natriuretic peptide (BNP) level  Thyroid  nodule     Rx / DC Orders   ED Discharge Orders          Ordered    lidocaine  (LIDODERM ) 5 %  Every 24 hours        04/26/24 1318    Ambulatory referral to Cardiology  Status:  Canceled       Comments: If you have not heard from the Cardiology office within the next 72 hours please call [phone].   04/26/24 1320    oxyCODONE  (ROXICODONE ) 5 MG immediate release tablet  Every 8 hours PRN        04/26/24 1333             Note:  This document was prepared using Dragon voice recognition software and may include unintentional dictation errors.    Waymond Lorelle Cummins, MD 04/26/24 [phone]

## 2024-04-26 NOTE — Telephone Encounter (Signed)
 FYI Only or Action Required?: FYI only for provider: ED advised.  Patient was last seen in primary care on 02/05/2024 by Myrla Jon HERO, MD.  Called Nurse Triage reporting Back Pain.  Symptoms began several days ago.  Interventions attempted: Prescription medications: muscle relaxer.  Symptoms are: gradually worsening.  Triage Disposition: Go to ED Now (Notify PCP)  Patient/caregiver understands and will follow disposition?: yes  Copied from CRM #[id number]. Topic: Clinical - Red Word Triage >> Apr 26, 2024  7:57 AM Delon HERO wrote: Red Word that prompted transfer to Nurse Triage: Patient is calling to report back pain that started yesterday. Causing patient to loose her breathe at time. Has taken muscle relaxer with no relief. Patient is a heart patient concern about her heart- Reason for Disposition  [1] SEVERE back pain (e.g., excruciating) AND [2] sudden onset AND [3] age > 60 years  Answer Assessment - Initial Assessment Questions 1. ONSET: When did the pain begin? (e.g., minutes, hours, days)     Started Wednesday 2. LOCATION: Where does it hurt? (upper, mid or lower back)     Under arms and across the back 3. SEVERITY: How bad is the pain?  (e.g., Scale 1-10; mild, moderate, or severe)     8/10 4. PATTERN: Is the pain constant? (e.g., yes, no; constant, intermittent)      constant 5. RADIATION: Does the pain shoot into your legs or somewhere else?     Makes breathing difficult 6. CAUSE:  What do you think is causing the back pain?      Unsure- patient afraid it may be her heart  8. MEDICINES: What have you taken so far for the pain? (e.g., nothing, acetaminophen , NSAIDS)     Patient has taken muscle relaxer without relief  10. OTHER SYMPTOMS: Do you have any other symptoms? (e.g., fever, abdomen pain, burning with urination, blood in urine)       Difficultly breathing due to pain at times  Protocols used: Back Pain-A-AH

## 2024-04-26 NOTE — ED Triage Notes (Signed)
 C/O upper back and neck pain that has progressed to chest and arms. Onset yesterday.  Has had similar back pain in the past which was muscle pain.  Metaxalone  800 mg taken, once last night and once this morning at 0700.  No relief of symptoms.  AAOx3. Skin warm and dry. NAD

## 2024-04-26 NOTE — ED Notes (Signed)
 Pt given DC instructions. Pt verbalized understanding of medications and follow up care. Pt taken from ED in wheelchair by this RN.

## 2024-04-26 NOTE — Discharge Instructions (Addendum)
 You can take 650 mg of Tylenol  every 6 hours as needed for pain.  I have also prescribed you some Lidoderm  patches.  I will prescribe you a short prescription of oxycodone  for severe breakthrough pain.  Please try to take the oxycodone  at night since it can be sedating, please do not drive when you are on the oxycodone .  Please be sure to follow-up with primary care doctor early next week to get reassessed.  Your CT scan was also noted for an enlarged heart, it does show that there is some fluid backing up into your venous system.  I know that you already are followed with the heart failure clinic, expect her to follow-up with them.  Please take your Lasix  as prescribed.

## 2024-04-29 ENCOUNTER — Encounter

## 2024-04-29 ENCOUNTER — Encounter (HOSPITAL_COMMUNITY): Payer: Self-pay | Admitting: Cardiology

## 2024-04-29 ENCOUNTER — Ambulatory Visit (HOSPITAL_COMMUNITY)
Admission: RE | Admit: 2024-04-29 | Discharge: 2024-04-29 | Disposition: A | Source: Ambulatory Visit | Attending: Cardiology | Admitting: Cardiology

## 2024-04-29 VITALS — BP 118/60 | HR 70 | Wt 215.8 lb

## 2024-04-29 DIAGNOSIS — I272 Pulmonary hypertension, unspecified: Secondary | ICD-10-CM

## 2024-04-29 DIAGNOSIS — I4821 Permanent atrial fibrillation: Secondary | ICD-10-CM | POA: Diagnosis not present

## 2024-04-29 DIAGNOSIS — I5032 Chronic diastolic (congestive) heart failure: Secondary | ICD-10-CM

## 2024-04-29 DIAGNOSIS — I495 Sick sinus syndrome: Secondary | ICD-10-CM

## 2024-04-29 DIAGNOSIS — D472 Monoclonal gammopathy: Secondary | ICD-10-CM | POA: Diagnosis not present

## 2024-04-29 DIAGNOSIS — J449 Chronic obstructive pulmonary disease, unspecified: Secondary | ICD-10-CM | POA: Diagnosis not present

## 2024-04-29 DIAGNOSIS — R0789 Other chest pain: Secondary | ICD-10-CM | POA: Diagnosis not present

## 2024-04-29 DIAGNOSIS — Z950 Presence of cardiac pacemaker: Secondary | ICD-10-CM | POA: Diagnosis not present

## 2024-04-29 DIAGNOSIS — Z87891 Personal history of nicotine dependence: Secondary | ICD-10-CM | POA: Diagnosis not present

## 2024-04-29 DIAGNOSIS — G4733 Obstructive sleep apnea (adult) (pediatric): Secondary | ICD-10-CM | POA: Diagnosis not present

## 2024-04-29 DIAGNOSIS — E669 Obesity, unspecified: Secondary | ICD-10-CM | POA: Diagnosis not present

## 2024-04-29 DIAGNOSIS — Z7984 Long term (current) use of oral hypoglycemic drugs: Secondary | ICD-10-CM | POA: Diagnosis not present

## 2024-04-29 DIAGNOSIS — Z79899 Other long term (current) drug therapy: Secondary | ICD-10-CM | POA: Diagnosis not present

## 2024-04-29 DIAGNOSIS — I110 Hypertensive heart disease with heart failure: Secondary | ICD-10-CM | POA: Diagnosis not present

## 2024-04-29 NOTE — Patient Instructions (Signed)
 There has been no changes to your medications.  Your physician recommends that you schedule a follow-up appointment in: 6 months ( June 2026) ** PLEASE CALL THE OFFICE IN APRIL TO ARRANGE YOUR FOLLOW UP APPOINTMENT.**  If you have any questions or concerns before your next appointment please send us  a message through [place] or call our office at [phone].    TO LEAVE A MESSAGE FOR THE NURSE SELECT OPTION 2, PLEASE LEAVE A MESSAGE INCLUDING: YOUR NAME DATE OF BIRTH CALL BACK NUMBER REASON FOR CALL**this is important as we prioritize the call backs  YOU WILL RECEIVE A CALL BACK THE SAME DAY AS LONG AS YOU CALL BEFORE 4:00 PM  At the [facility], you and your health needs are our priority. As part of our continuing mission to provide you with exceptional heart care, we have created designated Provider Care Teams. These Care Teams include your primary Cardiologist (physician) and Advanced Practice Providers (APPs- Physician Assistants and Nurse Practitioners) who all work together to provide you with the care you need, when you need it.   You may see any of the following providers on your designated Care Team at your next follow up: Dr Toribio Fuel Dr Ezra Shuck Dr. Morene Brownie Greig Mosses, NP Caffie Shed, [place] [facility] [place], [place] Beckey Coe, NP Jordan Lee, NP Ellouise Class, NP Tinnie Redman, PharmD Jaun Bash, PharmD   Please be sure to bring in all your medications bottles to every appointment.    Thank you for choosing [facility]

## 2024-05-01 ENCOUNTER — Encounter

## 2024-05-01 DIAGNOSIS — I5032 Chronic diastolic (congestive) heart failure: Secondary | ICD-10-CM

## 2024-05-01 DIAGNOSIS — I214 Non-ST elevation (NSTEMI) myocardial infarction: Secondary | ICD-10-CM

## 2024-05-01 NOTE — Progress Notes (Signed)
 Remote PPM Transmission

## 2024-05-01 NOTE — Progress Notes (Signed)
 Cardiac Individual Treatment Plan  Patient Details  Name: Lauren Lloyd MRN: [id number] Date of Birth: 09-14-1940 Referring Provider:   Flowsheet Row Cardiac Rehab from 02/14/2024 in [facility] Cardiac and Pulmonary Rehab  Referring Provider Dr. Alm Clay    Initial Encounter Date:  Flowsheet Row Cardiac Rehab from 02/14/2024 in [facility] Cardiac and Pulmonary Rehab  Date 02/14/24    Visit Diagnosis: NSTEMI (non-ST elevated myocardial infarction) (HCC)  Heart failure, diastolic, chronic (HCC)  Patient's Home Medications on Admission: Current Medications[1]  Past Medical History: Past Medical History:  Diagnosis Date   (HFpEF) heart failure with preserved ejection fraction (HCC)    a. 05/2018 Echo: EF 55-60%, no rwma, mild to mod MR. Nl RV fxn. Mod TR. PASP .   Arthritis    knees, Hands   Arthritis of knee    Back pain    Carotid arterial disease    a. 03/2019 Carotid U/S: <50% bilat ICA stenoses.   CHF (congestive heart failure) (HCC)    Cholelithiasis    a. 10/2018 noted on CT.   Edema, lower extremity    Fatty liver    GERD (gastroesophageal reflux disease)    History of stress test    a. 06/2018 MV: EF 59%, no ischemia/infarct. Low risk.   Hypertension    Knee pain    Lactose intolerance    Mitral regurgitation    a. 05/2018 Echo: mild to mod MR.   Multinodular goiter    Obesity    OSA (obstructive sleep apnea)    PAF (paroxysmal atrial fibrillation) (HCC)    a.  Diagnosed 12/19; b. 05/2018 s/p DCCV; c. 03/2019 & 05/2019 recurrent AFib-->managed w/ amio load; d. CHADS2VASc = 6 (CHF, HTN, age x 2, vascular disease, female)-->Eliquis  & amio 100 qd.   PAH (pulmonary artery hypertension) (HCC)    RSV (acute bronchiolitis due to respiratory syncytial virus) 05/10/2022   Scoliosis    SOB (shortness of breath)    Swallowing difficulty     Tobacco Use: Tobacco Use History[2]  Labs: Review Flowsheet  More data exists      Latest Ref Rng & Units 08/16/2021 03/17/2022  10/28/2022 01/11/2024 01/24/2024  Labs for ITP Cardiac and Pulmonary Rehab  Cholestrol 0 - 200 mg/dL CANCELED  837  823  782  189   LDL (calc) 0 - 99 mg/dL - 93  899  875  880   HDL-C >40 mg/dL CANCELED  52  54  71  60   Trlycerides <150 mg/dL CANCELED  94  873  875  49   Hemoglobin A1c 4.8 - 5.6 % - 6.0  6.0  5.5  -     Exercise Target Goals: Exercise Program Goal: Individual exercise prescription set using results from initial 6 min walk test and THRR while considering  patients activity barriers and safety.   Exercise Prescription Goal: Initial exercise prescription builds to 30-45 minutes a day of aerobic activity, 2-3 days per week.  Home exercise guidelines will be given to patient during program as part of exercise prescription that the participant will acknowledge.   Education: Aerobic Exercise: - Group verbal and visual presentation on the components of exercise prescription. Introduces F.I.T.T principle from ACSM for exercise prescriptions.  Reviews F.I.T.T. principles of aerobic exercise including progression. Written material provided at class time. Flowsheet Row Cardiac Rehab from 02/14/2024 in [facility] Cardiac and Pulmonary Rehab  Education need identified 02/14/24    Education: Resistance Exercise: - Group verbal and visual presentation on the  components of exercise prescription. Introduces F.I.T.T principle from ACSM for exercise prescriptions  Reviews F.I.T.T. principles of resistance exercise including progression. Written material provided at class time.    Education: Exercise & Equipment Safety: - Individual verbal instruction and demonstration of equipment use and safety with use of the equipment. Flowsheet Row Cardiac Rehab from 02/14/2024 in [facility] Cardiac and Pulmonary Rehab  Date 02/14/24  Educator [facility]  Instruction Review Code 1- Verbalizes Understanding    Education: Exercise Physiology & General Exercise Guidelines: - Group verbal and written instruction with  models to review the exercise physiology of the cardiovascular system and associated critical values. Provides general exercise guidelines with specific guidelines to those with heart or lung disease. Written material provided at class time. Flowsheet Row Cardiac Rehab from 02/14/2024 in [facility] Cardiac and Pulmonary Rehab  Education need identified 02/14/24    Education: Flexibility, Balance, Mind/Body Relaxation: - Group verbal and visual presentation with interactive activity on the components of exercise prescription. Introduces F.I.T.T principle from ACSM for exercise prescriptions. Reviews F.I.T.T. principles of flexibility and balance exercise training including progression. Also discusses the mind body connection.  Reviews various relaxation techniques to help reduce and manage stress (i.e. Deep breathing, progressive muscle relaxation, and visualization). Balance handout provided to take home. Written material provided at class time.   Activity Barriers & Risk Stratification:  Activity Barriers & Cardiac Risk Stratification - 02/14/24 1027       Activity Barriers & Cardiac Risk Stratification   Activity Barriers Arthritis;Back Problems;Balance Concerns    Cardiac Risk Stratification Moderate          6 Minute Walk:  6 Minute Walk     Row Name 02/14/24 1026         6 Minute Walk   Phase Initial     Distance 1180 feet     Walk Time 6 minutes     # of Rest Breaks 0     MPH 2.2     METS 2.1     RPE 13     Perceived Dyspnea  3     VO2 Peak 7.4     Symptoms Yes (comment)     Comments SOB     Resting HR 72 bpm     Resting BP 114/52     Resting Oxygen Saturation  96 %     Exercise Oxygen Saturation  during 6 min walk 95 %     Max Ex. HR 131 bpm     Max Ex. BP 162/68     2 Minute Post BP 126/66        Oxygen Initial Assessment:   Oxygen Re-Evaluation:  Oxygen Re-Evaluation     Row Name 03/20/24 0945             Program Oxygen Prescription   Program Oxygen  Prescription None         Home Oxygen   Home Oxygen Device None       Sleep Oxygen Prescription CPAP       Home Exercise Oxygen Prescription None       Home Resting Oxygen Prescription None       Compliance with Home Oxygen Use No         Goals/Expected Outcomes   Short Term Goals To learn and demonstrate proper use of respiratory medications       Long  Term Goals Exhibits proper breathing techniques, such as pursed lip breathing or other method taught during program session  Comments Spoke with Mackensi about PLB and she verbalized understanding.       Goals/Expected Outcomes STG: continue to use breathing techniques. LTG: Manages breathing and decreases shortness of breath as needed          Oxygen Discharge (Final Oxygen Re-Evaluation):  Oxygen Re-Evaluation - 03/20/24 0945       Program Oxygen Prescription   Program Oxygen Prescription None      Home Oxygen   Home Oxygen Device None    Sleep Oxygen Prescription CPAP    Home Exercise Oxygen Prescription None    Home Resting Oxygen Prescription None    Compliance with Home Oxygen Use No      Goals/Expected Outcomes   Short Term Goals To learn and demonstrate proper use of respiratory medications    Long  Term Goals Exhibits proper breathing techniques, such as pursed lip breathing or other method taught during program session    Comments Spoke with Garnetta about PLB and she verbalized understanding.    Goals/Expected Outcomes STG: continue to use breathing techniques. LTG: Manages breathing and decreases shortness of breath as needed          Initial Exercise Prescription:  Initial Exercise Prescription - 02/14/24 1000       Date of Initial Exercise RX and Referring Provider   Date 02/14/24    Referring Provider Dr. Alm Clay      Oxygen   Maintain Oxygen Saturation 88% or higher      Treadmill   MPH 2    Grade 0    Minutes 15    METs 2.53      Recumbant Bike   Level 3    RPM 50    Watts 25     Minutes 15    METs 2.1      NuStep   Level 3    SPM 80    Minutes 15    METs 2.1      REL-XR   Level 3    Watts 25    Speed 50    Minutes 15    METs 2.1      T5 Nustep   Level 3    Minutes 15    METs 2.1      Track   Laps 31    Minutes 15    METs 2.69      Prescription Details   Duration Progress to 30 minutes of continuous aerobic without signs/symptoms of physical distress      Intensity   THRR 40-80% of Max Heartrate 97-123    Ratings of Perceived Exertion 11-13    Perceived Dyspnea 0-4      Progression   Progression Continue to progress workloads to maintain intensity without signs/symptoms of physical distress.      Resistance Training   Training Prescription Yes    Weight 4lb    Reps 10-15          Perform Capillary Blood Glucose checks as needed.  Exercise Prescription Changes:   Exercise Prescription Changes     Row Name 02/14/24 1000 03/07/24 0800 03/20/24 0900 03/21/24 1600 04/02/24 1500     Response to Exercise   Blood Pressure (Admit) 114/52 120/70 -- 138/76 142/78   Blood Pressure (Exercise) 162/68 144/62 -- 144/62 162/74   Blood Pressure (Exit) 126/66 122/62 -- 122/62 122/60   Heart Rate (Admit) 72 bpm 131 bpm -- 98 bpm 76 bpm   Heart Rate (Exercise) 131 bpm 132 bpm -- 131  bpm 131 bpm   Heart Rate (Exit) 78 bpm 74 bpm -- 68 bpm 88 bpm   Oxygen Saturation (Admit) 96 % -- -- -- --   Oxygen Saturation (Exercise) 95 % -- -- -- --   Oxygen Saturation (Exit) 95 % -- -- -- --   Rating of Perceived Exertion (Exercise) 13 15 -- 14 13   Perceived Dyspnea (Exercise) 3 -- -- -- --   Symptoms none none -- none none   Comments results 1st 2 weeks of exercise -- -- --   Duration -- Progress to 30 minutes of  aerobic without signs/symptoms of physical distress -- Progress to 30 minutes of  aerobic without signs/symptoms of physical distress Continue with 30 min of aerobic exercise without signs/symptoms of physical distress.   Intensity -- THRR  unchanged -- THRR unchanged THRR unchanged     Progression   Progression -- Continue to progress workloads to maintain intensity without signs/symptoms of physical distress. -- Continue to progress workloads to maintain intensity without signs/symptoms of physical distress. Continue to progress workloads to maintain intensity without signs/symptoms of physical distress.   Average METs -- 2.51 -- 2.5 2.33     Resistance Training   Training Prescription -- Yes -- Yes Yes   Weight -- 4lb -- 4lb 4 lb   Reps -- 10-15 -- 10-15 10-15     Interval Training   Interval Training -- No -- No No     Treadmill   MPH -- 1.9 -- 2 --   Grade -- 0 -- 0 --   Minutes -- 15 -- 15 --   METs -- 2.45 -- 2.53 --     NuStep   Level -- 4 -- 4 --   Minutes -- 15 -- 15 --   METs -- 3.5 -- 3.4 --     REL-XR   Level -- 3 -- 3 3   Minutes -- 15 -- 15 15   METs -- 2.8 -- 3.3 3.5     Biostep-RELP   Level -- 3 -- 2 2   SPM -- 50 -- -- --   Minutes -- 15 -- 15 15   METs -- 2 -- 3 2     Track   Laps -- -- -- 15 25   Minutes -- -- -- 15 15   METs -- -- -- 1.82 2.36     Home Exercise Plan   Plans to continue exercise at -- -- [employer] (comment)  [employer] for aerobic machines and strength training (w/ her personal trainer) [employer] (comment)  [employer] for [employer] and 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 Add 1 additional day to program exercise sessions. Add 1 additional day to program exercise sessions.   Initial Home Exercises Provided -- -- 03/20/24 03/20/24 03/20/24     Oxygen   Maintain Oxygen Saturation -- 88% or higher -- 88% or higher 88% or higher    Row Name 04/18/24 1100             Response to Exercise   Blood Pressure (Admit) 138/50       Blood Pressure (Exit) 134/60       Heart Rate  (Admit) 83 bpm       Heart Rate (Exercise) 131 bpm       Heart Rate (  Exit) 75 bpm       Rating of Perceived Exertion (Exercise) 13       Symptoms none       Duration Continue with 30 min of aerobic exercise without signs/symptoms of physical distress.       Intensity THRR unchanged         Progression   Progression Continue to progress workloads to maintain intensity without signs/symptoms of physical distress.       Average METs 3.27         Resistance Training   Training Prescription Yes       Weight 4 lb       Reps 10-15         Interval Training   Interval Training No         REL-XR   Level 3       Minutes 15       METs 6         Biostep-RELP   Level 2       Minutes 15       METs 3         Track   Laps 28       Minutes 15       METs 2.52         Home Exercise Plan   Plans to continue exercise at [employer] (comment)  [employer] for aerobic machines and strength training (w/ her personal trainer)       Frequency Add 1 additional day to program exercise sessions.       Initial Home Exercises Provided 03/20/24         Oxygen   Maintain Oxygen Saturation 88% or higher          Exercise Comments:   Exercise Comments     Row Name 02/19/24 9078           Exercise Comments First full day of exercise!  Patient was oriented to gym and equipment including functions, settings, policies, and procedures.  Patient's individual exercise prescription and treatment plan were reviewed.  All starting workloads were established based on the results of the 6 minute walk test done at initial orientation visit.  The plan for exercise progression was also introduced and progression will be customized based on patient's performance and goals.          Exercise Goals and Review:   Exercise Goals     Row Name 02/14/24 1031             Exercise Goals   Increase Physical Activity Yes       Intervention Provide advice, education, support and counseling about physical  activity/exercise needs.;Develop an individualized exercise prescription for aerobic and resistive training based on initial evaluation findings, risk stratification, comorbidities and participant's personal goals.       Expected Outcomes Short Term: Attend rehab on a regular basis to increase amount of physical activity.;Long Term: Exercising regularly at least 3-5 days a week.;Long Term: Add in home exercise to make exercise part of routine and to increase amount of physical activity.       Increase Strength and Stamina Yes       Intervention Provide advice, education, support and counseling about physical activity/exercise needs.;Develop an individualized exercise prescription for aerobic and resistive training based on initial evaluation findings, risk stratification, comorbidities and participant's personal goals.       Expected Outcomes Short Term: Increase workloads from initial exercise prescription for resistance, speed,  and METs.;Short Term: Perform resistance training exercises routinely during rehab and add in resistance training at home;Long Term: Improve cardiorespiratory fitness, muscular endurance and strength as measured by increased METs and functional capacity ( )       Able to understand and use rate of perceived exertion (RPE) scale Yes       Intervention Provide education and explanation on how to use RPE scale       Expected Outcomes Short Term: Able to use RPE daily in rehab to express subjective intensity level;Long Term:  Able to use RPE to guide intensity level when exercising independently       Able to understand and use Dyspnea scale Yes       Intervention Provide education and explanation on how to use Dyspnea scale       Expected Outcomes Short Term: Able to use Dyspnea scale daily in rehab to express subjective sense of shortness of breath during exertion;Long Term: Able to use Dyspnea scale to guide intensity level when exercising independently       Knowledge and  understanding of Target Heart Rate Range (THRR) Yes       Intervention Provide education and explanation of THRR including how the numbers were predicted and where they are located for reference       Expected Outcomes Short Term: Able to state/look up THRR;Long Term: Able to use THRR to govern intensity when exercising independently;Short Term: Able to use daily as guideline for intensity in rehab       Able to check pulse independently Yes       Intervention Provide education and demonstration on how to check pulse in carotid and radial arteries.;Review the importance of being able to check your own pulse for safety during independent exercise       Expected Outcomes Short Term: Able to explain why pulse checking is important during independent exercise;Long Term: Able to check pulse independently and accurately       Understanding of Exercise Prescription Yes       Intervention Provide education, explanation, and written materials on patient's individual exercise prescription       Expected Outcomes Short Term: Able to explain program exercise prescription;Long Term: Able to explain home exercise prescription to exercise independently          Exercise Goals Re-Evaluation :  Exercise Goals Re-Evaluation     Row Name 02/19/24 0921 03/07/24 0808 03/20/24 0957 03/21/24 1628 04/02/24 1514     Exercise Goal Re-Evaluation   Exercise Goals Review Increase Physical Activity;Able to understand and use rate of perceived exertion (RPE) scale;Knowledge and understanding of Target Heart Rate Range (THRR);Understanding of Exercise Prescription;Increase Strength and Stamina;Able to understand and use Dyspnea scale;Able to check pulse independently Increase Physical Activity;Understanding of Exercise Prescription;Increase Strength and Stamina Increase Physical Activity;Able to understand and use Dyspnea scale;Understanding of Exercise Prescription;Increase Strength and Stamina;Knowledge and understanding of  Target Heart Rate Range (THRR);Able to understand and use rate of perceived exertion (RPE) scale;Able to check pulse independently Increase Physical Activity;Increase Strength and Stamina;Understanding of Exercise Prescription Increase Physical Activity;Increase Strength and Stamina;Understanding of Exercise Prescription   Comments Reviewed RPE and dyspnea scale, THR and program prescription with pt today.  Pt voiced understanding and was given a copy of goals to take home. Nakeia is off to a good start in the program and she completed her first 2 weeks in this review. She worked at level 4 on the T4 nustep, level 3 on the biostep, and level  3 on the XR. She had a workload on the treadmill of a speed of 1.9 mph with no incline. We will continue to monitor her progress in the program. Reviewed home exercise with pt today.  Pt plans to go to the [facility] for aerobic machines and strength training with her personal trainer for exercise. She plans to start back with 1 additional day of exercise at home.  Reviewed THR, pulse, RPE, sign and symptoms, pulse oximetery and when to call 911 or MD.  Also discussed weather considerations and indoor options.  Pt voiced understanding. Nannie continues to do well in rehab. She has been able to increase her speed on the treadmill from 1.9mph to 2mph. She was also able to maintain level 4 on the T4 nustep and level 3 on the XR. We will continue to monitor her progress in the program. Claretha continues to do well in rehab. She has been able to increase her laps on the track to 25 laps walked. She also continues to work at level 3 on the XR and level 2 on the biostep. We will continue to monitor her progress in the program.   Expected Outcomes Short: Use RPE daily to regulate intensity. Long: Follow program prescription in THR. Short: Continue to follow current exercise prescription. Long: Continue exercise to improve strength and stamina. Short: Add 1 additional day of exercise at home.  Long: Continue to exercise independently. Short: Continue to increase treadmill workload. Long: Continue exercise to improve strength and stamina. Short: Increase to level 3 on the biostep. Long: Continue exercise to improve strength and stamina.    Row Name 04/18/24 1107             Exercise Goal Re-Evaluation   Exercise Goals Review Increase Physical Activity;Increase Strength and Stamina;Understanding of Exercise Prescription       Comments Ginna is doing well in rehab. She was able to increase her laps on the track back up to 28 laps walked. She also continues to work at level 3 on the XR and level 2 on the biostep. We will continue to monitor her progress in the program.       Expected Outcomes Short: Continue to push for more laps on the track. Long: Continue exercise to improve strength and stamina.          Discharge Exercise Prescription (Final Exercise Prescription Changes):  Exercise Prescription Changes - 04/18/24 1100       Response to Exercise   Blood Pressure (Admit) 138/50    Blood Pressure (Exit) 134/60    Heart Rate (Admit) 83 bpm    Heart Rate (Exercise) 131 bpm    Heart Rate (Exit) 75 bpm    Rating of Perceived Exertion (Exercise) 13    Symptoms none    Duration Continue with 30 min of aerobic exercise without signs/symptoms of physical distress.    Intensity THRR unchanged      Progression   Progression Continue to progress workloads to maintain intensity without signs/symptoms of physical distress.    Average METs 3.27      Resistance Training   Training Prescription Yes    Weight 4 lb    Reps 10-15      Interval Training   Interval Training No      REL-XR   Level 3    Minutes 15    METs 6      Biostep-RELP   Level 2    Minutes 15    METs 3  Track   Laps 28    Minutes 15    METs 2.52      Home Exercise Plan   Plans to continue exercise at [facility] (comment)   [employer] for aerobic machines and strength training (w/ her personal  trainer)   Frequency Add 1 additional day to program exercise sessions.    Initial Home Exercises Provided 03/20/24      Oxygen   Maintain Oxygen Saturation 88% or higher          Nutrition:  Target Goals: Understanding of nutrition guidelines, daily intake of sodium 1500mg , cholesterol 200mg , calories 30% from fat and 7% or less from saturated fats, daily to have 5 or more servings of fruits and vegetables.  Education: Nutrition 1 -Group instruction provided by verbal, written material, interactive activities, discussions, models, and posters to present general guidelines for heart healthy nutrition including macronutrients, label reading, and promoting whole foods over processed counterparts. Education serves as pensions consultant of discussion of heart healthy eating for all. Written material provided at class time.    Education: Nutrition 2 -Group instruction provided by verbal, written material, interactive activities, discussions, models, and posters to present general guidelines for heart healthy nutrition including sodium, cholesterol, and saturated fat. Providing guidance of habit forming to improve blood pressure, cholesterol, and body weight. Written material provided at class time.     Biometrics:  Pre Biometrics - 02/14/24 1032       Pre Biometrics   Height 5' 6.2 (1.681 m)    Weight 215 lb 3.2 oz (97.6 kg)    Waist Circumference 40 inches    Hip Circumference 47 inches    Waist to Hip Ratio 0.85 %    BMI (Calculated) 34.54    Single Leg Stand 3 seconds           Nutrition Therapy Plan and Nutrition Goals:   Nutrition Assessments:  MEDIFICTS Score Key: >=70 Need to make dietary changes  40-70 Heart Healthy Diet <= 40 Therapeutic Level Cholesterol Diet  Flowsheet Row Cardiac Rehab from 02/14/2024 in [facility] Cardiac and Pulmonary Rehab  Picture Your Plate Total Score on Admission 54   Picture Your Plate Scores: <59 Unhealthy dietary pattern with much room  for improvement. 41-50 Dietary pattern unlikely to meet recommendations for good health and room for improvement. 51-60 More healthful dietary pattern, with some room for improvement.  >60 Healthy dietary pattern, although there may be some specific behaviors that could be improved.    Nutrition Goals Re-Evaluation:  Nutrition Goals Re-Evaluation     Row Name 03/20/24 [phone]             Goals   Comment Spoke with Ethelreda about eating smaller meals during the day and making sure she eats nutrient dense foods to help her meet her nutrition goals       Expected Outcome STG: Continue to focus on protien and read labels controlling sodium intake LTG: Follow a healthy diet that supports increased needs for pulmonary health          Nutrition Goals Discharge (Final Nutrition Goals Re-Evaluation):  Nutrition Goals Re-Evaluation - 03/20/24 0948       Goals   Comment Spoke with Kehlani about eating smaller meals during the day and making sure she eats nutrient dense foods to help her meet her nutrition goals    Expected Outcome STG: Continue to focus on protien and read labels controlling sodium intake LTG: Follow a healthy diet that supports increased needs for  pulmonary health          Psychosocial: Target Goals: Acknowledge presence or absence of significant depression and/or stress, maximize coping skills, provide positive support system. Participant is able to verbalize types and ability to use techniques and skills needed for reducing stress and depression.   Education: Stress, Anxiety, and Depression - Group verbal and visual presentation to define topics covered.  Reviews how body is impacted by stress, anxiety, and depression.  Also discusses healthy ways to reduce stress and to treat/manage anxiety and depression. Written material provided at class time.   Education: Sleep Hygiene -Provides group verbal and written instruction about how sleep can affect your health.  Define sleep  hygiene, discuss sleep cycles and impact of sleep habits. Review good sleep hygiene tips.   Initial Review & Psychosocial Screening:  Initial Psych Review & Screening - 02/07/24 1014       Initial Review   Current issues with None Identified      Family Dynamics   Good Support System? Yes      Barriers   Psychosocial barriers to participate in program The patient should benefit from training in stress management and relaxation.;There are no identifiable barriers or psychosocial needs.      Screening Interventions   Interventions Encouraged to exercise;To provide support and resources with identified psychosocial needs;Provide feedback about the scores to participant    Expected Outcomes Short Term goal: Utilizing psychosocial counselor, staff and physician to assist with identification of specific Stressors or current issues interfering with healing process. Setting desired goal for each stressor or current issue identified.;Long Term Goal: Stressors or current issues are controlled or eliminated.;Short Term goal: Identification and review with participant of any Quality of Life or Depression concerns found by scoring the questionnaire.;Long Term goal: The participant improves quality of Life and PHQ9 Scores as seen by post scores and/or verbalization of changes          Quality of Life Scores:   Quality of Life - 02/14/24 1032       Quality of Life   Select Quality of Life      Quality of Life Scores   Health/Function Pre 20.47 %    Socioeconomic Pre 26.25 %    Psych/Spiritual Pre 30 %    Family Pre 21.6 %    GLOBAL Pre 23.86 %         Scores of 19 and below usually indicate a poorer quality of life in these areas.  A difference of  2-3 points is a clinically meaningful difference.  A difference of 2-3 points in the total score of the Quality of Life Index has been associated with significant improvement in overall quality of life, self-image, physical symptoms, and general  health in studies assessing change in quality of life.  PHQ-9: Review Flowsheet  More data exists      02/14/2024 01/17/2024 01/11/2024 12/07/2023 11/10/2023  Depression screen PHQ 2/9  Decreased Interest 0 0 0 0 0  Down, Depressed, Hopeless 0 0 0 0 0  PHQ - 2 Score 0 0 0 0 0  Altered sleeping 0 0 1 0 -  Tired, decreased energy 1 0 1 1 -  Change in appetite 0 0 1 0 -  Feeling bad or failure about yourself  0 0 - 0 -  Trouble concentrating 0 0 0 0 -  Moving slowly or fidgety/restless 0 0 0 0 -  Suicidal thoughts 0 0 0 0 -  PHQ-9 Score 1  0  3  1  -  Difficult doing work/chores Not difficult at all Not difficult at all Somewhat difficult Somewhat difficult -    Details       Data saved with a previous flowsheet row definition        Interpretation of Total Score  Total Score Depression Severity:  1-4 = Minimal depression, 5-9 = Mild depression, 10-14 = Moderate depression, 15-19 = Moderately severe depression, 20-27 = Severe depression   Psychosocial Evaluation and Intervention:  Psychosocial Evaluation - 02/07/24 1026       Psychosocial Evaluation & Interventions   Interventions Encouraged to exercise with the program and follow exercise prescription    Comments Ms. Angelik is coming to cardiac rehab after a NSTEMI. When asked about stress, she states she is under a normal amount of stress that any body else would be and she feels like she is handling it well. She reports no sleep concerns either. She is ready to start the program.    Expected Outcomes Short: attend cardiac rehab for education and exercise Long: develop and maintain positive self care habits    Continue Psychosocial Services  Follow up required by staff          Psychosocial Re-Evaluation:  Psychosocial Re-Evaluation     Row Name 03/20/24 0946             Psychosocial Re-Evaluation   Current issues with Current Sleep Concerns       Comments Annet reports she has struggled with sleep, but lately she  has gotten more used to wearing her CPAP and says it has helped alot.       Expected Outcomes STG: Continue to wear CPAP. LTG: achieve and maintain positive outlook on health and daily life       Interventions Encouraged to attend Pulmonary Rehabilitation for the exercise       Continue Psychosocial Services  Follow up required by staff          Psychosocial Discharge (Final Psychosocial Re-Evaluation):  Psychosocial Re-Evaluation - 03/20/24 0946       Psychosocial Re-Evaluation   Current issues with Current Sleep Concerns    Comments Liat reports she has struggled with sleep, but lately she has gotten more used to wearing her CPAP and says it has helped alot.    Expected Outcomes STG: Continue to wear CPAP. LTG: achieve and maintain positive outlook on health and daily life    Interventions Encouraged to attend Pulmonary Rehabilitation for the exercise    Continue Psychosocial Services  Follow up required by staff          Vocational Rehabilitation: Provide vocational rehab assistance to qualifying candidates.   Vocational Rehab Evaluation & Intervention:   Education: Education Goals: Education classes will be provided on a variety of topics geared toward better understanding of heart health and risk factor modification. Participant will state understanding/return demonstration of topics presented as noted by education test scores.  Learning Barriers/Preferences:  Learning Barriers/Preferences - 02/07/24 1026       Learning Barriers/Preferences   Learning Barriers None    Learning Preferences None          General Cardiac Education Topics:  AED/CPR: - Group verbal and written instruction with the use of models to demonstrate the basic use of the AED with the basic ABC's of resuscitation.   Test and Procedures: - Group verbal and visual presentation and models provide information about basic cardiac anatomy and function. Reviews the testing  methods done to diagnose  heart disease and the outcomes of the test results. Describes the treatment choices: Medical Management, Angioplasty, or Coronary Bypass Surgery for treating various heart conditions including Myocardial Infarction, Angina, Valve Disease, and Cardiac Arrhythmias. Written material provided at class time.   Medication Safety: - Group verbal and visual instruction to review commonly prescribed medications for heart and lung disease. Reviews the medication, class of the drug, and side effects. Includes the steps to properly store meds and maintain the prescription regimen. Written material provided at class time.   Intimacy: - Group verbal instruction through game format to discuss how heart and lung disease can affect sexual intimacy. Written material provided at class time.   Know Your Numbers and Heart Failure: - Group verbal and visual instruction to discuss disease risk factors for cardiac and pulmonary disease and treatment options.  Reviews associated critical values for Overweight/Obesity, Hypertension, Cholesterol, and Diabetes.  Discusses basics of heart failure: signs/symptoms and treatments.  Introduces Heart Failure Zone chart for action plan for heart failure. Written material provided at class time.   Infection Prevention: - Provides verbal and written material to individual with discussion of infection control including proper hand washing and proper equipment cleaning during exercise session. Flowsheet Row Cardiac Rehab from 02/14/2024 in [facility] Cardiac and Pulmonary Rehab  Date 02/14/24  Educator [facility]  Instruction Review Code 1- Verbalizes Understanding    Falls Prevention: - Provides verbal and written material to individual with discussion of falls prevention and safety. Flowsheet Row Cardiac Rehab from 02/14/2024 in [facility] Cardiac and Pulmonary Rehab  Date 02/14/24  Educator [facility]  Instruction Review Code 1- Verbalizes Understanding    Other: -Provides group and verbal  instruction on various topics (see comments)   Knowledge Questionnaire Score:  Knowledge Questionnaire Score - 02/14/24 1033       Knowledge Questionnaire Score   Pre Score 23/26          Core Components/Risk Factors/Patient Goals at Admission:  Personal Goals and Risk Factors at Admission - 02/07/24 1013       Core Components/Risk Factors/Patient Goals on Admission    Weight Management Yes;Weight Loss    Intervention Weight Management: Develop a combined nutrition and exercise program designed to reach desired caloric intake, while maintaining appropriate intake of nutrient and fiber, sodium and fats, and appropriate energy expenditure required for the weight goal.;Weight Management: Provide education and appropriate resources to help participant work on and attain dietary goals.;Weight Management/Obesity: Establish reasonable short term and long term weight goals.;Obesity: Provide education and appropriate resources to help participant work on and attain dietary goals.    Expected Outcomes Short Term: Continue to assess and modify interventions until short term weight is achieved;Long Term: Adherence to nutrition and physical activity/exercise program aimed toward attainment of established weight goal;Weight Maintenance: Understanding of the daily nutrition guidelines, which includes 25-35% calories from fat, 7% or less cal from saturated fats, less than 200mg  cholesterol, less than 1.5gm of sodium, & 5 or more servings of fruits and vegetables daily;Weight Loss: Understanding of general recommendations for a balanced deficit meal plan, which promotes 1-2 lb weight loss per week and includes a negative energy balance of [phone] kcal/d;Understanding recommendations for meals to include 15-35% energy as protein, 25-35% energy from fat, 35-60% energy from carbohydrates, less than 200mg  of dietary cholesterol, 20-35 gm of total fiber daily;Understanding of distribution of calorie intake  throughout the day with the consumption of 4-5 meals/snacks    Improve shortness of breath with  ADL's Yes    Intervention Provide education, individualized exercise plan and daily activity instruction to help decrease symptoms of SOB with activities of daily living.    Expected Outcomes Short Term: Improve cardiorespiratory fitness to achieve a reduction of symptoms when performing ADLs;Long Term: Be able to perform more ADLs without symptoms or delay the onset of symptoms    Heart Failure Yes    Intervention Provide a combined exercise and nutrition program that is supplemented with education, support and counseling about heart failure. Directed toward relieving symptoms such as shortness of breath, decreased exercise tolerance, and extremity edema.    Expected Outcomes Improve functional capacity of life;Short term: Attendance in program 2-3 days a week with increased exercise capacity. Reported lower sodium intake. Reported increased fruit and vegetable intake. Reports medication compliance.;Short term: Daily weights obtained and reported for increase. Utilizing diuretic protocols set by physician.;Long term: Adoption of self-care skills and reduction of barriers for early signs and symptoms recognition and intervention leading to self-care maintenance.    Hypertension Yes    Intervention Provide education on lifestyle modifcations including regular physical activity/exercise, weight management, moderate sodium restriction and increased consumption of fresh fruit, vegetables, and low fat dairy, alcohol moderation, and smoking cessation.;Monitor prescription use compliance.    Expected Outcomes Short Term: Continued assessment and intervention until BP is < 140/48mm HG in hypertensive participants. < 130/50mm HG in hypertensive participants with diabetes, heart failure or chronic kidney disease.;Long Term: Maintenance of blood pressure at goal levels.    Lipids Yes    Intervention Provide education and  support for participant on nutrition & aerobic/resistive exercise along with prescribed medications to achieve LDL 70mg , HDL >40mg .    Expected Outcomes Short Term: Participant states understanding of desired cholesterol values and is compliant with medications prescribed. Participant is following exercise prescription and nutrition guidelines.;Long Term: Cholesterol controlled with medications as prescribed, with individualized exercise RX and with personalized nutrition plan. Value goals: LDL < 70mg , HDL > 40 mg.          Education:Diabetes - Individual verbal and written instruction to review signs/symptoms of diabetes, desired ranges of glucose level fasting, after meals and with exercise. Acknowledge that pre and post exercise glucose checks will be done for 3 sessions at entry of program.   Core Components/Risk Factors/Patient Goals Review:   Goals and Risk Factor Review     Row Name 03/20/24 0949             Core Components/Risk Factors/Patient Goals Review   Personal Goals Review Improve shortness of breath with ADL's       Review Ninel reports coming to rehab has helped her perform her ADLs better and with less shortness of breath.       Expected Outcomes STG: Work with EP today to [employer] home exercise plan. LTG: exercise independently and practice breathing techniques taught at pulmonary rehab.          Core Components/Risk Factors/Patient Goals at Discharge (Final Review):   Goals and Risk Factor Review - 03/20/24 0949       Core Components/Risk Factors/Patient Goals Review   Personal Goals Review Improve shortness of breath with ADL's    Review Kenlyn reports coming to rehab has helped her perform her ADLs better and with less shortness of breath.    Expected Outcomes STG: Work with EP today to [employer] home exercise plan. LTG: exercise independently and practice breathing techniques taught at pulmonary rehab.  ITP Comments:  ITP Comments     Row Name  02/07/24 1019 02/14/24 1026 02/19/24 0921 03/06/24 1002 04/03/24 0945   ITP Comments Initial phone call completed. Diagnosis can be found in [facility] 9/9. EP Orientation scheduled for Wednesday 10/1 at 9am. Completed and gym orientation for cardiac rehab. Initial ITP created and sent for review to Dr. Oneil Pinal, Medical Director. First full day of exercise!  Patient was oriented to gym and equipment including functions, settings, policies, and procedures.  Patient's individual exercise prescription and treatment plan were reviewed.  All starting workloads were established based on the results of the 6 minute walk test done at initial orientation visit.  The plan for exercise progression was also introduced and progression will be customized based on patient's performance and goals. 30 Day review completed. Medical Director ITP review done, changes made as directed, and signed approval by Medical Director. 30 Day review completed. Medical Director ITP review done, changes made as directed, and signed approval by Medical Director.    Row Name 05/01/24 0754           ITP Comments 30 Day review completed. Medical Director ITP review done, changes made as directed, and signed approval by Medical Director.          Comments: 30 day review     [1]  Current Outpatient Medications:    allopurinol  (ZYLOPRIM ) 100 MG tablet, TAKE ONE TABLET BY MOUTH EVERY DAY, Disp: 90 tablet, Rfl: 1   atorvastatin  (LIPITOR ) 80 MG tablet, TAKE 1 TABLET BY MOUTH DAILY, Disp: 90 tablet, Rfl: 1   calcium  carbonate (TUMS EX) 750 MG chewable tablet, Chew 2 tablets by mouth daily as needed for heartburn., Disp: , Rfl:    Cholecalciferol  25 MCG (1000 UT) tablet, Take 1,000 Units by mouth daily., Disp: , Rfl:    cholestyramine (QUESTRAN) 4 g packet, Take 4 g by mouth., Disp: , Rfl:    clopidogrel  (PLAVIX ) 75 MG tablet, TAKE 1 TABLET BY MOUTH DAILY, Disp: 90 tablet, Rfl: 1   dapagliflozin  propanediol (FARXIGA ) 10 MG TABS  tablet, Take 1 tablet (10 mg total) by mouth daily before breakfast., Disp: 30 tablet, Rfl: 6   ELIQUIS  5 MG TABS tablet, TAKE ONE TABLET TWICE DAILY, Disp: 180 tablet, Rfl: 1   ezetimibe  (ZETIA ) 10 MG tablet, TAKE 1 TABLET BY MOUTH DAILY, Disp: 90 tablet, Rfl: 3   furosemide  (LASIX ) 40 MG tablet, TAKE 1 TABLET BY MOUTH DAILY. TAKE AN EXTRA TABLET AS NEEDED AFTER LUNCH FOR ABDOMINAL SWELLING, LEG SWELLING OR SHORTNESS OF BREATH, Disp: 180 tablet, Rfl: 1   gabapentin  (NEURONTIN ) 600 MG tablet, Take 1 tablet (600 mg total) by mouth at bedtime., Disp: 90 tablet, Rfl: 3   lactobacillus acidophilus (BACID) TABS tablet, Take 1-2 tablets by mouth 2 (two) times daily. 2 AM AND 1 HS (Patient not taking: Reported on 04/29/2024), Disp: , Rfl:    lidocaine  (LIDODERM ) 5 %, Place 1 patch onto the skin daily for 10 days. Remove & Discard patch within 12 hours or as directed by MD, Disp: 10 patch, Rfl: 0   Magnesium  300 MG TABS, Take 300 mg by mouth daily., Disp: , Rfl:    Menthol, Topical Analgesic, (BIOFREEZE EX), Apply 1 application. topically daily as needed (Neck pain)., Disp: , Rfl:    Multiple Vitamin (MULTIVITAMIN) capsule, Take 1 capsule by mouth daily., Disp: , Rfl:    nitroGLYCERIN  (NITROSTAT ) 0.4 MG SL tablet, Place 1 tablet (0.4 mg total) under the tongue every  5 (five) minutes as needed for chest pain., Disp: 30 tablet, Rfl: 12   oxyCODONE  (ROXICODONE ) 5 MG immediate release tablet, Take 1 tablet (5 mg total) by mouth every 8 (eight) hours as needed for up to 8 doses., Disp: 8 tablet, Rfl: 0   potassium chloride  (KLOR-CON ) 10 MEQ tablet, Take 2 tablets (20 mEq total) by mouth daily., Disp: 60 tablet, Rfl: 10   Tafamidis  (VYNDAMAX ) 61 MG CAPS, Take 1 capsule (61 mg total) by mouth daily., Disp: 30 capsule, Rfl: 11   Tiotropium Bromide -Olodaterol (STIOLTO RESPIMAT ) 2.5-2.5 MCG/ACT AERS, Inhale 2 puffs into the lungs daily as needed., Disp: 3 each, Rfl: 4 [2]  Social History Tobacco Use  Smoking Status  Former   Current packs/day: 0.00   Average packs/day: 1 pack/day for 30.0 years (30.0 ttl pk-yrs)   Types: Cigarettes   Start date: 05/17/1959   Quit date: 05/16/1989   Years since quitting: 34.9   Passive exposure: Past  Smokeless Tobacco Never

## 2024-05-03 ENCOUNTER — Ambulatory Visit: Payer: Self-pay | Admitting: Cardiology

## 2024-05-03 ENCOUNTER — Encounter

## 2024-05-03 NOTE — Progress Notes (Signed)
" ° °  ADVANCED HEART FAILURE FOLLOW UP CLINIC NOTE  Referring Physician: Myrla Jon HERO, MD  Primary Care: Myrla Jon HERO, MD Primary Cardiologist: Dr. Perla  HPI: Lauren Lloyd is a 83 y.o. female with a PMH of HFpEF, permanent atrial fibrillation s/p AV nodal ablation, HTN, OSA, COPD who presents for follow up of cardiac amyloid.     83 y.o. female with history of permanent atrial fibrillation, PPM 06/23 followed by AV nodal ablation 02/24, chronic diastolic CHF, HTN, OSA on CPAP, prior tobacco use/COPD, chronic dyspnea.  Last echo 05/24: EF 60-65%, RV okay, RVSP 38 mmHg, moderate TR  She was seen in the ED 05/26/23 for abnormal labs at PCP visit. Had noted orthostatic dizziness and dyspnea with exertion. HS troponin mildly elevated at 16 and BUN also elevated. She was hypertensive but vitals otherwise stable.  Troponin trend and ECG not c/w ACS. CXR with some evidence of CHF. She was given an extra dose of PO lasix . Felt to be stable for discharge home and was referred to Cardiology and Advanced Heart Failure clinic.     SUBJECTIVE: Overall doing fair, reports no significant issues since her last visit. Was in the ER recently for chest discomfort, but reports that she has not had significant anginal symptoms since that time. She is otherwise feeling well, does not do well with beta blockers. Device functioning appropriately.   PMH, current medications, allergies, social history, and family history reviewed in epic.  PHYSICAL EXAM: Vitals:   04/29/24 1428  BP: 84/60  Pulse: 70  SpO2: 97%   GENERAL: NAD, well appearing PULM:  Normal work of breathing, CTAB CARDIAC:  JVP: flat         Normal rate with regular rhythm. No murmurs, rubs or gallops.  No edema. Warm and well perfused extremities. ABDOMEN: Soft, non-tender, non-distended. NEUROLOGIC: Patient is oriented x3 with no focal or lateralizing neurologic deficits.    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 PYP scan positive with grade II uptake and H/Cl 1.4,   ASSESSMENT & PLAN:  Chronic diastolic CHF: Known permanent atrial fibrillation, BiV pacer. 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 Tolerating well, stable NYHA class II symptoms. - Continue lasix  40mg  daily - Continue farxiga  10mg  daily - Continue tafamadis - Consider MRA if worsening volume status in the future, euvolemic today   Permanent atrial fibrillation: S/p BiV pacer, prior LBBB. No issues. Does not tolerate BB well.  - EP follow up  Chest pain: Appears consistent with cardiac angina. Most likely related to known amyloid. Recent ED visit, but infrequent symptoms. Cath as above, known LAD disease but distal. - Management as above  HTN: Well controlled today. - Management as above  Obesity: Complicates care.   Morene Brownie, MD Advanced Heart Failure Mechanical Circulatory Support 05/03/2024 "

## 2024-05-06 ENCOUNTER — Inpatient Hospital Stay: Admitting: Family Medicine

## 2024-05-06 ENCOUNTER — Encounter

## 2024-05-08 ENCOUNTER — Ambulatory Visit (HOSPITAL_COMMUNITY)

## 2024-05-08 ENCOUNTER — Telehealth: Payer: Self-pay | Admitting: Neurology

## 2024-05-08 ENCOUNTER — Encounter

## 2024-05-08 NOTE — Telephone Encounter (Signed)
 [employer] medicare auth: [id number] exp. 05/16/24-06/14/24 Scheduled at [facility] on 05/21/24

## 2024-05-13 ENCOUNTER — Encounter

## 2024-05-15 ENCOUNTER — Encounter

## 2024-05-17 ENCOUNTER — Other Ambulatory Visit

## 2024-05-17 ENCOUNTER — Other Ambulatory Visit: Payer: Self-pay

## 2024-05-17 NOTE — CV Procedure (Signed)
" °  Device system confirmed to be MRI conditional, with implant date > 6 weeks ago, and no evidence of abandoned or epicardial leads in review of most recent CXR  Device last cleared by EP Provider: Daphne Barrack 05/17/2024  Clearance is good through for 1 year as long as parameters remain stable at time of check. If pt undergoes a cardiac device procedure during that time, they should be re-cleared.   Tachy-therapies to be programmed off if applicable with device back to pre-MRI settings after completion of exam.  [employer] was available remotely to assist in programming recommendations.   Rocky Catalan, RT  05/17/2024 10:14 AM     "

## 2024-05-20 ENCOUNTER — Encounter: Attending: Cardiology | Admitting: *Deleted

## 2024-05-20 DIAGNOSIS — R7989 Other specified abnormal findings of blood chemistry: Secondary | ICD-10-CM | POA: Diagnosis not present

## 2024-05-20 DIAGNOSIS — R0602 Shortness of breath: Secondary | ICD-10-CM | POA: Insufficient documentation

## 2024-05-20 DIAGNOSIS — R079 Chest pain, unspecified: Secondary | ICD-10-CM | POA: Insufficient documentation

## 2024-05-20 DIAGNOSIS — I214 Non-ST elevation (NSTEMI) myocardial infarction: Secondary | ICD-10-CM | POA: Insufficient documentation

## 2024-05-20 NOTE — Progress Notes (Signed)
 Daily Session Note  Patient Details  Name: Lauren Lloyd MRN: [id number] Date of Birth: 1941/03/24 Referring Provider:   Flowsheet Row Cardiac Rehab from 02/14/2024 in [facility] Cardiac and Pulmonary Rehab  Referring Provider Dr. Alm Clay    Encounter Date: 05/20/2024  Check In:  Session Check In - 05/20/24 0935       Check-In   Supervising physician immediately available to respond to emergencies See telemetry face sheet for immediately available ER MD    Location [facility]-Cardiac & Pulmonary Rehab    Staff Present Othel Durand, RN, BSN, CCRP;Joseph Hood RCP,RRT,BSRT;Kelly Lowry BS, ACSM CEP, Exercise Physiologist;Maxon Conetta BS, Exercise Physiologist    Virtual Visit No    Medication changes reported     No    Fall or balance concerns reported    No    Warm-up and Cool-down Performed on first and last piece of equipment    Resistance Training Performed Yes    VAD Patient? No    PAD/SET Patient? No      Pain Assessment   Currently in Pain? No/denies             Tobacco Use History[1]  Goals Met:  Independence with exercise equipment Exercise tolerated well No report of concerns or symptoms today  Goals Unmet:  Not Applicable  Comments: Pt able to follow exercise prescription today without complaint.  Will continue to monitor for progression.    Dr. Oneil Pinal is Medical Director for [facility] Cardiac Rehabilitation.  Dr. Fuad Aleskerov is Medical Director for [facility] Pulmonary Rehabilitation.    [1]  Social History Tobacco Use  Smoking Status Former   Current packs/day: 0.00   Average packs/day: 1 pack/day for 30.0 years (30.0 ttl pk-yrs)   Types: Cigarettes   Start date: 05/17/1959   Quit date: 05/16/1989   Years since quitting: 35.0   Passive exposure: Past  Smokeless Tobacco Never

## 2024-05-21 ENCOUNTER — Other Ambulatory Visit (HOSPITAL_COMMUNITY): Payer: Self-pay

## 2024-05-21 ENCOUNTER — Ambulatory Visit (HOSPITAL_COMMUNITY)
Admission: RE | Admit: 2024-05-21 | Discharge: 2024-05-21 | Disposition: A | Discharge status: Home / Self Care | Source: Ambulatory Visit | Attending: Neurology | Admitting: Neurology

## 2024-05-21 DIAGNOSIS — R55 Syncope and collapse: Secondary | ICD-10-CM | POA: Diagnosis not present

## 2024-05-21 DIAGNOSIS — R42 Dizziness and giddiness: Secondary | ICD-10-CM | POA: Diagnosis present

## 2024-05-21 NOTE — Progress Notes (Signed)
 Patient was monitored by this RN during MRI scan due to presence of a pacemaker. Cardiac rhythm was continuously monitored throughout the procedure. Prior to the start of the scan, the pacemaker was placed in MRI-safe mode by the MRI technician and/or pacemaker representative. Following the completion of the scan, the device was returned to its pre-MRI settings. Neurological status and orientation post-procedure were unchanged from baseline.   Pre-procedure Heart Rate (Prior to being placed in MRI safe mode): 70 During scan : 80 Post-procedure Heart Rate (Once pacemaker is returned to baseline mode):  71

## 2024-05-22 ENCOUNTER — Encounter: Admitting: Emergency Medicine

## 2024-05-22 ENCOUNTER — Inpatient Hospital Stay: Attending: Oncology

## 2024-05-22 DIAGNOSIS — D472 Monoclonal gammopathy: Secondary | ICD-10-CM | POA: Insufficient documentation

## 2024-05-22 DIAGNOSIS — I214 Non-ST elevation (NSTEMI) myocardial infarction: Secondary | ICD-10-CM

## 2024-05-22 NOTE — Progress Notes (Signed)
 Daily Session Note  Patient Details  Name: Lauren Lloyd MRN: [id number] Date of Birth: Oct 03, 1940 Referring Provider:   Flowsheet Row Cardiac Rehab from 02/14/2024 in [facility] Cardiac and Pulmonary Rehab  Referring Provider Dr. Alm Clay    Encounter Date: 05/22/2024  Check In:  Session Check In - 05/22/24 0908       Check-In   Supervising physician immediately available to respond to emergencies See telemetry face sheet for immediately available ER MD    Location [facility]-Cardiac & Pulmonary Rehab    Staff Present Leita Franks RN,BSN;Joseph [facility] BS, Exercise Physiologist;Margaret Best, MS, Exercise Physiologist    Virtual Visit No    Medication changes reported     No    Fall or balance concerns reported    No    Tobacco Cessation No Change    Warm-up and Cool-down Performed on first and last piece of equipment    Resistance Training Performed Yes    VAD Patient? No    PAD/SET Patient? No      Pain Assessment   Currently in Pain? No/denies             Tobacco Use History[1]  Goals Met:  Independence with exercise equipment Exercise tolerated well No report of concerns or symptoms today Strength training completed today  Goals Unmet:  Not Applicable  Comments: Pt able to follow exercise prescription today without complaint.  Will continue to monitor for progression.    Dr. Oneil Pinal is Medical Director for [facility] Cardiac Rehabilitation.  Dr. Fuad Aleskerov is Medical Director for [facility] Pulmonary Rehabilitation.    [1]  Social History Tobacco Use  Smoking Status Former   Current packs/day: 0.00   Average packs/day: 1 pack/day for 30.0 years (30.0 ttl pk-yrs)   Types: Cigarettes   Start date: 05/17/1959   Quit date: 05/16/1989   Years since quitting: 35.0   Passive exposure: Past  Smokeless Tobacco Never

## 2024-05-24 ENCOUNTER — Other Ambulatory Visit: Payer: Self-pay

## 2024-05-24 ENCOUNTER — Encounter

## 2024-05-24 ENCOUNTER — Telehealth: Payer: Self-pay | Admitting: Oncology

## 2024-05-24 ENCOUNTER — Ambulatory Visit: Admitting: Oncology

## 2024-05-24 NOTE — Telephone Encounter (Signed)
 Pt scheduled for MD only (lab results) on 1/14 and no showed labs prior on 1/7.  I called and lvm for pt to call back to r/s the lab appt prior to MD appt.  Scheduling phone number provided

## 2024-05-26 NOTE — Progress Notes (Unsigned)
 "      Date:  05/28/2024   ID:  Lauren Lloyd, DOB [birth year], MRN [id number]  Patient Location:  [address] [place] [address]   Provider location:   [facility] [place] [facility]  PCP:  Myrla Jon HERO, MD  Cardiologist:  Perla MOCCASIN [facility]  Chief Complaint  Patient presents with   Follow-up    Patient c/o frequent bruising with barely bumping into anything, has shortness of breath always but nothing unusual or different than before and angina at times.      History of Present Illness:    Lauren Lloyd is a 84 y.o. female  past medical history of Aortic atherosclerosis on CT scan 2020 hypertension,  asthma  Chronic lower extremity swelling Hospital admission for atrial fibrillation December 2019 Chronic diastolic CHF Status post successful DCCV on 06/15/2018.  NSR on 06/2018 stress test, 07/2018 Showing no significant ischemia Mild lower extremity swelling, chronic atrial fibrillation started June 2022 after her knee surgery Now permanent atrial fibrillation pacemaker was implanted October 25, 2021 and she had an AV nodal ablation on July 05, 2022.  Who presents for follow-up of her permanent atrial fibrillation and acute on chronic diastolic CHF  Last seen by myself in clinic 1/25  pacemaker was implanted October 25, 2021  AV nodal ablation on July 05, 2022.   admitted January 23 2024 with chest pain and non-STEMI.  Echo September 10 with an EF of 50-55% with hypokinesis of the mid to distal anterior, anteroseptal, and apical regions (suggestive of LAD distribution infarct).   On plavix  and eliquis , spontaneous bleeding, nose, skin Wants to stop plavix   Other issues discussed today Jaw pain, locks up Dentist reports that she may be grinding her teeth but did not provide a dental appliance for her  Episodes of dizzy spells, neurology following  MRI brain read pending  In the hospital April 26, 2024 chest  pain Nonradiating, Bilateral hand tingling was new BNP 1600 Was given dose IV Lasix  Received Tylenol , Lidoderm  patches, oxycodone  for musculoskeletal pain CT chest nonacute, reflux of contrast into IVC and hepatic veins consistent with right heart insufficiency  Labs reviewed: CR 1.0 on Lasix  daily  No regular exercise program Pressure low, denies orthostasis symptoms  History of sleep apnea, compliant with her CPAP  EKG personally reviewed by myself on todays visit EKG Interpretation Date/Time:  Tuesday May 28 2024 09:06:34 EST Ventricular Rate:  72 PR Interval:    QRS Duration:  150 QT Interval:  466 QTC Calculation: 510 R Axis:   -63  Text Interpretation: Ventricular-paced rhythm When compared with ECG of 26-Apr-2024 10:45, No significant change was found Confirmed by Perla Lye [phone]) on 05/28/2024 9:08:06 AM   On Eliquis  for stroke prophylaxis. Post AV nodal ablation.   Previously tried amiodarone , Tikosyn  Prior cardioversions  [employer] PPM implanted 10/25/2021 for tachy brady  Chronic shortness of breath on exertion Multifactorial, ventricularly paced,   asthma versus chronic bronchitis Sedentary at baseline, deconditioned  Echocardiogram May 2024 for shortness of breath EF 60 to 65%  EKG personally reviewed by myself on todays visit EKG Interpretation Date/Time:  Tuesday May 28 2024 09:06:34 EST Ventricular Rate:  72 PR Interval:    QRS Duration:  150 QT Interval:  466 QTC Calculation: 510 R Axis:   -63  Text Interpretation: Ventricular-paced rhythm When compared with ECG of 26-Apr-2024 10:45, No significant change was found Confirmed by Perla Lye ([id number]) on 05/28/2024 9:08:06 AM  Other past medical history reviewed History of back pain, has received cortisone injections Followed by Dr. Avanell   Nov 2020, developed atrial fib  stress test, 07/2018 Showing no significant ischemia normal ejection fraction  normal perfusion  Presented to the hospital 04/2018 with worsening shortness of breath, leg swelling, weight gain, abdominal bloating and new atrial fibrillation with RVR Initially on Cardizem  infusion, changed to oral Cardizem  with titration upwards Discharged on Cardizem  CD 300 mg and metoprolol  25 mg bid, Eliquis . Treated with IV Lasix  during her hospital course with marked improvement in her shortness of breath   Carotid ultrasound January 2020 Less than 39% disease bilaterally   Past Medical History:  Diagnosis Date   (HFpEF) heart failure with preserved ejection fraction (HCC)    a. 05/2018 Echo: EF 55-60%, no rwma, mild to mod MR. Nl RV fxn. Mod TR. PASP .   Arthritis    knees, Hands   Arthritis of knee    Back pain    Carotid arterial disease    a. 03/2019 Carotid U/S: <50% bilat ICA stenoses.   CHF (congestive heart failure) (HCC)    Cholelithiasis    a. 10/2018 noted on CT.   Edema, lower extremity    Fatty liver    GERD (gastroesophageal reflux disease)    History of stress test    a. 06/2018 MV: EF 59%, no ischemia/infarct. Low risk.   Hypertension    Knee pain    Lactose intolerance    Mitral regurgitation    a. 05/2018 Echo: mild to mod MR.   Multinodular goiter    Obesity    OSA (obstructive sleep apnea)    PAF (paroxysmal atrial fibrillation) (HCC)    a.  Diagnosed 12/19; b. 05/2018 s/p DCCV; c. 03/2019 & 05/2019 recurrent AFib-->managed w/ amio load; d. CHADS2VASc = 6 (CHF, HTN, age x 2, vascular disease, female)-->Eliquis  & amio 100 qd.   PAH (pulmonary artery hypertension) (HCC)    RSV (acute bronchiolitis due to respiratory syncytial virus) 05/10/2022   Scoliosis    SOB (shortness of breath)    Swallowing difficulty    Past Surgical History:  Procedure Laterality Date   AV NODE ABLATION N/A 07/05/2022   Procedure: AV NODE ABLATION;  Surgeon: Cindie Ole DASEN, MD;  Location: [facility] INVASIVE CV LAB;  Service: Cardiovascular;  Laterality: N/A;    CARDIOVERSION N/A 06/15/2018   Procedure: CARDIOVERSION (CATH LAB);  Surgeon: Perla Evalene PARAS, MD;  Location: [facility] ORS;  Service: Cardiovascular;  Laterality: N/A;   CARDIOVERSION N/A 02/18/2021   Procedure: CARDIOVERSION;  Surgeon: Perla Evalene PARAS, MD;  Location: [facility] ORS;  Service: Cardiovascular;  Laterality: N/A;   CATARACT EXTRACTION W/PHACO Right 01/25/2016   Procedure: CATARACT EXTRACTION PHACO AND INTRAOCULAR LENS PLACEMENT (IOC);  Surgeon: Donzell Arlyce Budd, MD;  Location: [facility];  Service: Ophthalmology;  Laterality: Right;  RIGHT   CATARACT EXTRACTION W/PHACO Left 02/22/2016   Procedure: CATARACT EXTRACTION PHACO AND INTRAOCULAR LENS PLACEMENT (IOC);  Surgeon: Donzell Arlyce Budd, MD;  Location: [facility];  Service: Ophthalmology;  Laterality: Left;  LEFT   COLONOSCOPY N/A 01/22/2024   Procedure: COLONOSCOPY;  Surgeon: Therisa Bi, MD;  Location: [facility] ENDOSCOPY;  Service: Gastroenterology;  Laterality: N/A;   HAMMER TOE SURGERY  05/16/2008   KNEE ARTHROSCOPY Right 05/16/2002   LEFT HEART CATH AND CORONARY ANGIOGRAPHY N/A 01/25/2024   Procedure: LEFT HEART CATH AND CORONARY ANGIOGRAPHY;  Surgeon: Anner Alm ORN, MD;  Location: [facility] INVASIVE CV LAB;  Service: Cardiovascular;  Laterality: N/A;   PACEMAKER IMPLANT N/A 10/25/2021   Procedure: PACEMAKER IMPLANT;  Surgeon: Cindie Ole DASEN, MD;  Location: [facility] INVASIVE CV LAB;  Service: Cardiovascular;  Laterality: N/A;   PARTIAL HIP ARTHROPLASTY Left 12/17/2021   POLYPECTOMY  01/22/2024   Procedure: POLYPECTOMY, INTESTINE;  Surgeon: Therisa Bi, MD;  Location: [facility] ENDOSCOPY;  Service: Gastroenterology;;   REPLACEMENT TOTAL KNEE Right 05/17/2007   [facility]   SKIN GRAFT Left 04/08/2013   Done on left index finger   TONSILLECTOMY  05/16/1944   TOTAL KNEE ARTHROPLASTY Left 12/02/2020     Current Meds  Medication Sig   allopurinol  (ZYLOPRIM ) 100 MG tablet TAKE ONE TABLET BY MOUTH  EVERY DAY   atorvastatin  (LIPITOR ) 80 MG tablet TAKE 1 TABLET BY MOUTH DAILY   calcium  carbonate (TUMS EX) 750 MG chewable tablet Chew 2 tablets by mouth daily as needed for heartburn.   Cholecalciferol  25 MCG (1000 UT) tablet Take 1,000 Units by mouth daily.   cholestyramine (QUESTRAN) 4 g packet Take 4 g by mouth.   clopidogrel  (PLAVIX ) 75 MG tablet TAKE 1 TABLET BY MOUTH DAILY   dapagliflozin  propanediol (FARXIGA ) 10 MG TABS tablet Take 1 tablet (10 mg total) by mouth daily before breakfast.   ELIQUIS  5 MG TABS tablet TAKE ONE TABLET TWICE DAILY   ezetimibe  (ZETIA ) 10 MG tablet TAKE 1 TABLET BY MOUTH DAILY   furosemide  (LASIX ) 40 MG tablet TAKE 1 TABLET BY MOUTH DAILY. TAKE AN EXTRA TABLET AS NEEDED AFTER LUNCH FOR ABDOMINAL SWELLING, LEG SWELLING OR SHORTNESS OF BREATH   gabapentin  (NEURONTIN ) 600 MG tablet Take 1 tablet (600 mg total) by mouth at bedtime.   Magnesium  300 MG TABS Take 300 mg by mouth daily.   Menthol, Topical Analgesic, (BIOFREEZE EX) Apply 1 application. topically daily as needed (Neck pain).   Multiple Vitamin (MULTIVITAMIN) capsule Take 1 capsule by mouth daily.   nitroGLYCERIN  (NITROSTAT ) 0.4 MG SL tablet Place 1 tablet (0.4 mg total) under the tongue every 5 (five) minutes as needed for chest pain.   oxyCODONE  (ROXICODONE ) 5 MG immediate release tablet Take 1 tablet (5 mg total) by mouth every 8 (eight) hours as needed for up to 8 doses.   potassium chloride  (KLOR-CON ) 10 MEQ tablet Take 2 tablets (20 mEq total) by mouth daily.   Tafamidis  (VYNDAMAX ) 61 MG CAPS Take 1 capsule (61 mg total) by mouth daily.   Tiotropium Bromide -Olodaterol (STIOLTO RESPIMAT ) 2.5-2.5 MCG/ACT AERS Inhale 2 puffs into the lungs daily as needed.     Allergies:   Levofloxacin, Influenza vaccine recombinant, Influenza vaccines, and Oysters [shellfish allergy]   Social History   Tobacco Use   Smoking status: Former    Current packs/day: 0.00    Average packs/day: 1 pack/day for 30.0 years  (30.0 ttl pk-yrs)    Types: Cigarettes    Start date: 05/17/1959    Quit date: 05/16/1989    Years since quitting: 35.0    Passive exposure: Past   Smokeless tobacco: Never  Vaping Use   Vaping status: Never Used  Substance Use Topics   Alcohol use: Yes    Alcohol/week: 14.0 standard drinks of alcohol    Types: 14 Glasses of wine per week   Drug use: No    Family Hx: [name] family history includes Atrial fibrillation in her sister and sister; Breast cancer (age of onset: 38) in her sister; Healthy in her brother; Heart attack in her father; Heart disease in her mother; High blood  pressure in her father; Hyperlipidemia in her sister and sister; Stroke in her father; Transient ischemic attack in her mother.  ROS:   Please see the history of present illness.    Review of Systems  Constitutional: Negative.   HENT: Negative.    Respiratory:  Positive for shortness of breath.   Cardiovascular: Negative.   Gastrointestinal: Negative.   Musculoskeletal:  Positive for joint pain.  Neurological: Negative.   Psychiatric/Behavioral: Negative.    All other systems reviewed and are negative.    Labs/Other Tests and Data Reviewed:    Recent Labs: 01/11/2024: ALT 27 04/26/2024: Pro Brain Natriuretic Peptide 1,685.0 05/27/2024: BUN 21; Creatinine 1.00; Hemoglobin 14.1; Platelet Count 270; Potassium 4.0; Sodium 141   Recent Lipid Panel Lab Results  Component Value Date/Time   CHOL 189 01/24/2024 12:08 AM   CHOL 217 (H) 01/11/2024 10:37 AM   TRIG 49 01/24/2024 12:08 AM   HDL 60 01/24/2024 12:08 AM   HDL 71 01/11/2024 10:37 AM   CHOLHDL 3.2 01/24/2024 12:08 AM   LDLCALC 119 (H) 01/24/2024 12:08 AM   LDLCALC 124 (H) 01/11/2024 10:37 AM    Wt Readings from Last 3 Encounters:  05/28/24 211 lb 8 oz (95.9 kg)  04/29/24 215 lb 12.8 oz (97.9 kg)  04/26/24 215 lb 6.2 oz (97.7 kg)     Exam:    BP 100/60 (BP Location: Left Arm, Patient Position: Sitting, Cuff Size: Normal)   Pulse 72    Ht 5' 7 (1.702 m)   Wt 211 lb 8 oz (95.9 kg)   SpO2 91%   BMI 33.13 kg/m  Constitutional:  oriented to person, place, and time. No distress.  HENT:  Head: Normocephalic and atraumatic.  Eyes:  no discharge. No scleral icterus.  Neck: Normal range of motion. Neck supple. No JVD present.  Cardiovascular: Normal rate, regular rhythm, normal heart sounds and intact distal pulses. Exam reveals no gallop and no friction rub. No edema No murmur heard. Pulmonary/Chest: Effort normal and breath sounds normal. No stridor. No respiratory distress.  no wheezes.  no rales.  no tenderness.  Abdominal: Soft.  no distension.  no tenderness.  Musculoskeletal: Normal range of motion.  no  tenderness or deformity.  Neurological:  normal muscle tone. Coordination normal. No atrophy Skin: Skin is warm and dry. No rash noted. not diaphoretic.  Psychiatric:  normal mood and affect. behavior is normal. Thought content normal.      ASSESSMENT & PLAN:    Permanent atrial fibrillation (HCC) -  History of AV node ablation, ventricularly paced Permanent atrial fibrillation Followed by EP Tolerating Eliquis ,  Appears that she is off bisoprolol  given low blood pressure  Chronic shortness of breath Likely multifactorial including underlying lung disease, A-fib, ventricularly paced, chronic diastolic CHF, obesity, deconditioning Recommend regular walking program, calorie restriction  Chronic diastolic CHF (congestive heart failure) (HCC) Stable on Lasix  40 daily, recommended extra Lasix  for worsening shortness of breath Will BMP Echocardiogram May 2024 with mildly elevated right heart pressures, no change on echo September 2025  Leg edema We will avoid calcium  channel blockers Continue Lasix  40 daily, minimal edema today   Centrilobular emphysema (HCC)  Prior history of smoking, has chronic shortness of breath Followed by pulmonary,  Sleep apnea CPAP mask fitting better, now compliant  Jaw  pain Concerning for TMJ Will defer to dentist, may need dental guard Exercises/instructions provided for TMJ Not a good candidate to take ibuprofen as adjusted by dentist given she is on blood thinners  Obesity Low carbohydrate diet recommended, regular walking program  Pulmonary hypertension, unspecified (HCC) Mild pulmonary hypertension seen on prior echocardiogram exacerbated by atrial fibrillation and CHF, obesity Lasix  40 daily with extra Lasix  as needed  Back pain, knee pain Stable  Fatigue Poor sleep at baseline, deconditioned Recommend regular walking program  Signed, Evalene Lunger, MD  05/28/2024 9:09 AM    [facility] [address], [place], [place] [address]  "

## 2024-05-27 ENCOUNTER — Inpatient Hospital Stay

## 2024-05-27 ENCOUNTER — Other Ambulatory Visit: Payer: Self-pay

## 2024-05-27 ENCOUNTER — Encounter

## 2024-05-27 DIAGNOSIS — I214 Non-ST elevation (NSTEMI) myocardial infarction: Secondary | ICD-10-CM

## 2024-05-27 DIAGNOSIS — D472 Monoclonal gammopathy: Secondary | ICD-10-CM | POA: Diagnosis present

## 2024-05-27 LAB — CBC (CANCER CENTER ONLY)
HCT: 42.6 % (ref 36.0–46.0)
Hemoglobin: 14.1 g/dL (ref 12.0–15.0)
MCH: 31.1 pg (ref 26.0–34.0)
MCHC: 33.1 g/dL (ref 30.0–36.0)
MCV: 94 fL (ref 80.0–100.0)
Platelet Count: 270 K/uL (ref 150–400)
RBC: 4.53 MIL/uL (ref 3.87–5.11)
RDW: 13.2 % (ref 11.5–15.5)
WBC Count: 7 K/uL (ref 4.0–10.5)
nRBC: 0 % (ref 0.0–0.2)

## 2024-05-27 LAB — BASIC METABOLIC PANEL - CANCER CENTER ONLY
Anion gap: 12 (ref 5–15)
BUN: 21 mg/dL (ref 8–23)
CO2: 25 mmol/L (ref 22–32)
Calcium: 9.7 mg/dL (ref 8.9–10.3)
Chloride: 103 mmol/L (ref 98–111)
Creatinine: 1 mg/dL (ref 0.44–1.00)
GFR, Estimated: 55 mL/min — ABNORMAL LOW
Glucose, Bld: 99 mg/dL (ref 70–99)
Potassium: 4 mmol/L (ref 3.5–5.1)
Sodium: 141 mmol/L (ref 135–145)

## 2024-05-27 NOTE — Progress Notes (Signed)
 Specialty Pharmacy Refill Coordination Note  Lauren Lloyd is a 84 y.o. female contacted today regarding refills of specialty medication(s) Tafamidis  (Vyndamax )   Patient requested Delivery   Delivery date: 05/31/24   Verified address: [address] [place] [address]   Medication will be filled on: 05/30/24

## 2024-05-27 NOTE — Progress Notes (Signed)
 Daily Session Note  Patient Details  Name: Lauren Lloyd MRN: [id number] Date of Birth: 1941-05-15 Referring Provider:   Flowsheet Row Cardiac Rehab from 02/14/2024 in [facility] Cardiac and Pulmonary Rehab  Referring Provider Dr. Alm Clay    Encounter Date: 05/27/2024  Check In:  Session Check In - 05/27/24 0933       Check-In   Supervising physician immediately available to respond to emergencies See telemetry face sheet for immediately available ER MD    Location [facility]-Cardiac & Pulmonary Rehab    Staff Present Burnard Davenport RN,BSN,MPA;Joseph [facility] RCP,RRT,BSRT;Maxon Burnell BS, Exercise Physiologist;Shelitha Magley Dyane BS, ACSM CEP, Exercise Physiologist    Virtual Visit No    Medication changes reported     No    Fall or balance concerns reported    No    Tobacco Cessation No Change    Warm-up and Cool-down Performed on first and last piece of equipment    Resistance Training Performed Yes    VAD Patient? No    PAD/SET Patient? No      Pain Assessment   Currently in Pain? No/denies             Tobacco Use History[1]  Goals Met:  Independence with exercise equipment Exercise tolerated well No report of concerns or symptoms today Strength training completed today  Goals Unmet:  Not Applicable  Comments: Pt able to follow exercise prescription today without complaint.  Will continue to monitor for progression.    Dr. Oneil Pinal is Medical Director for [facility] Cardiac Rehabilitation.  Dr. Fuad Aleskerov is Medical Director for [facility] Pulmonary Rehabilitation.    [1]  Social History Tobacco Use  Smoking Status Former   Current packs/day: 0.00   Average packs/day: 1 pack/day for 30.0 years (30.0 ttl pk-yrs)   Types: Cigarettes   Lloyd date: 05/17/1959   Quit date: 05/16/1989   Years since quitting: 35.0   Passive exposure: Past  Smokeless Tobacco Never

## 2024-05-28 ENCOUNTER — Ambulatory Visit: Attending: Cardiovascular Disease | Admitting: Cardiovascular Disease

## 2024-05-28 ENCOUNTER — Encounter: Payer: Self-pay | Admitting: Cardiovascular Disease

## 2024-05-28 VITALS — BP 100/60 | HR 72 | Ht 67.0 in | Wt 211.5 lb

## 2024-05-28 DIAGNOSIS — I272 Pulmonary hypertension, unspecified: Secondary | ICD-10-CM

## 2024-05-28 DIAGNOSIS — I10 Essential (primary) hypertension: Secondary | ICD-10-CM

## 2024-05-28 DIAGNOSIS — I779 Disorder of arteries and arterioles, unspecified: Secondary | ICD-10-CM | POA: Diagnosis not present

## 2024-05-28 DIAGNOSIS — I214 Non-ST elevation (NSTEMI) myocardial infarction: Secondary | ICD-10-CM

## 2024-05-28 DIAGNOSIS — I480 Paroxysmal atrial fibrillation: Secondary | ICD-10-CM

## 2024-05-28 DIAGNOSIS — I5032 Chronic diastolic (congestive) heart failure: Secondary | ICD-10-CM | POA: Diagnosis not present

## 2024-05-28 DIAGNOSIS — I495 Sick sinus syndrome: Secondary | ICD-10-CM | POA: Diagnosis not present

## 2024-05-28 DIAGNOSIS — E7800 Pure hypercholesterolemia, unspecified: Secondary | ICD-10-CM

## 2024-05-28 LAB — KAPPA/LAMBDA LIGHT CHAINS
Kappa free light chain: 29.4 mg/L — ABNORMAL HIGH (ref 3.3–19.4)
Kappa, lambda light chain ratio: 0.95 (ref 0.26–1.65)
Lambda free light chains: 31 mg/L — ABNORMAL HIGH (ref 5.7–26.3)

## 2024-05-28 LAB — IGG, IGA, IGM
IgA: 326 mg/dL (ref 64–422)
IgG (Immunoglobin G), Serum: 987 mg/dL (ref 586–1602)
IgM (Immunoglobulin M), Srm: 247 mg/dL — ABNORMAL HIGH (ref 26–217)

## 2024-05-28 LAB — BETA 2 MICROGLOBULIN, SERUM: Beta-2 Microglobulin: 3.1 mg/L — ABNORMAL HIGH (ref 0.6–2.4)

## 2024-05-28 NOTE — Patient Instructions (Addendum)
 Medication Instructions:   Please hold plavix   If you need a refill on your cardiac medications before your next appointment, please call your pharmacy.   Lab work: No new labs needed  Testing/Procedures: No new testing needed  Follow-Up: At [facility], you and your health needs are our priority.  As part of our continuing mission to provide you with exceptional heart care, we have created designated Provider Care Teams.  These Care Teams include your primary Cardiologist (physician) and Advanced Practice Providers (APPs -  Physician Assistants and Nurse Practitioners) who all work together to provide you with the care you need, when you need it.  You will need a follow up appointment in 12 months  Providers on your designated Care Team:   Lonni Meager, NP Bernardino Bring, PA-C Cadence [place], [place]  COVID-19 Vaccine Information can be found at: [email or web address] For questions related to vaccine distribution or appointments, please email [email or web address] or call [phone].

## 2024-05-29 ENCOUNTER — Inpatient Hospital Stay: Admitting: Oncology

## 2024-05-29 ENCOUNTER — Encounter: Payer: Self-pay | Admitting: *Deleted

## 2024-05-29 ENCOUNTER — Encounter: Admitting: Emergency Medicine

## 2024-05-29 ENCOUNTER — Encounter: Payer: Self-pay | Admitting: Oncology

## 2024-05-29 VITALS — BP 138/78 | HR 70 | Temp 96.2°F | Resp 18 | Ht 67.0 in | Wt 212.0 lb

## 2024-05-29 DIAGNOSIS — D472 Monoclonal gammopathy: Secondary | ICD-10-CM

## 2024-05-29 DIAGNOSIS — I214 Non-ST elevation (NSTEMI) myocardial infarction: Secondary | ICD-10-CM

## 2024-05-29 LAB — PROTEIN ELECTROPHORESIS, SERUM
A/G Ratio: 1.1 (ref 0.7–1.7)
Albumin ELP: 3.4 g/dL (ref 2.9–4.4)
Alpha-1-Globulin: 0.3 g/dL (ref 0.0–0.4)
Alpha-2-Globulin: 0.7 g/dL (ref 0.4–1.0)
Beta Globulin: 1.1 g/dL (ref 0.7–1.3)
Gamma Globulin: 1.1 g/dL (ref 0.4–1.8)
Globulin, Total: 3.2 g/dL (ref 2.2–3.9)
M-Spike, %: 0.4 g/dL — ABNORMAL HIGH
Total Protein ELP: 6.6 g/dL (ref 6.0–8.5)

## 2024-05-29 NOTE — Progress Notes (Signed)
 Cardiac Individual Treatment Plan  Patient Details  Name: Lauren Lloyd MRN: [id number] Date of Birth: 24-May-1940 Referring Provider:   Flowsheet Row Cardiac Rehab from 02/14/2024 in [facility] Cardiac and Pulmonary Rehab  Referring Provider Dr. Alm Clay    Initial Encounter Date:  Flowsheet Row Cardiac Rehab from 02/14/2024 in [facility] Cardiac and Pulmonary Rehab  Date 02/14/24    Visit Diagnosis: NSTEMI (non-ST elevated myocardial infarction) [facility])  Patient's Home Medications on Admission: Current Medications[1]  Past Medical History: Past Medical History:  Diagnosis Date   (HFpEF) heart failure with preserved ejection fraction (HCC)    a. 05/2018 Echo: EF 55-60%, no rwma, mild to mod MR. Nl RV fxn. Mod TR. PASP .   Arthritis    knees, Hands   Arthritis of knee    Back pain    Carotid arterial disease    a. 03/2019 Carotid U/S: <50% bilat ICA stenoses.   CHF (congestive heart failure) (HCC)    Cholelithiasis    a. 10/2018 noted on CT.   Edema, lower extremity    Fatty liver    GERD (gastroesophageal reflux disease)    History of stress test    a. 06/2018 MV: EF 59%, no ischemia/infarct. Low risk.   Hypertension    Knee pain    Lactose intolerance    Mitral regurgitation    a. 05/2018 Echo: mild to mod MR.   Multinodular goiter    Obesity    OSA (obstructive sleep apnea)    PAF (paroxysmal atrial fibrillation) (HCC)    a.  Diagnosed 12/19; b. 05/2018 s/p DCCV; c. 03/2019 & 05/2019 recurrent AFib-->managed w/ amio load; d. CHADS2VASc = 6 (CHF, HTN, age x 2, vascular disease, female)-->Eliquis  & amio 100 qd.   PAH (pulmonary artery hypertension) (HCC)    RSV (acute bronchiolitis due to respiratory syncytial virus) 05/10/2022   Scoliosis    SOB (shortness of breath)    Swallowing difficulty     Tobacco Use: Tobacco Use History[2]  Labs: Review Flowsheet  More data exists      Latest Ref Rng & Units 08/16/2021 03/17/2022 10/28/2022 01/11/2024 01/24/2024  Labs for ITP  Cardiac and Pulmonary Rehab  Cholestrol 0 - 200 mg/dL CANCELED  837  823  782  189   LDL (calc) 0 - 99 mg/dL - 93  899  875  880   HDL-C >40 mg/dL CANCELED  52  54  71  60   Trlycerides <150 mg/dL CANCELED  94  873  875  49   Hemoglobin A1c 4.8 - 5.6 % - 6.0  6.0  5.5  -     Exercise Target Goals: Exercise Program Goal: Individual exercise prescription set using results from initial 6 min walk test and THRR while considering  patients activity barriers and safety.   Exercise Prescription Goal: Initial exercise prescription builds to 30-45 minutes a day of aerobic activity, 2-3 days per week.  Home exercise guidelines will be given to patient during program as part of exercise prescription that the participant will acknowledge.   Education: Aerobic Exercise: - Group verbal and visual presentation on the components of exercise prescription. Introduces F.I.T.T principle from ACSM for exercise prescriptions.  Reviews F.I.T.T. principles of aerobic exercise including progression. Written material provided at class time. Flowsheet Row Cardiac Rehab from 02/14/2024 in [facility] Cardiac and Pulmonary Rehab  Education need identified 02/14/24    Education: Resistance Exercise: - Group verbal and visual presentation on the components of exercise prescription. Introduces F.I.T.T  principle from ACSM for exercise prescriptions  Reviews F.I.T.T. principles of resistance exercise including progression. Written material provided at class time.    Education: Exercise & Equipment Safety: - Individual verbal instruction and demonstration of equipment use and safety with use of the equipment. Flowsheet Row Cardiac Rehab from 02/14/2024 in [facility] Cardiac and Pulmonary Rehab  Date 02/14/24  Educator [facility]  Instruction Review Code 1- Verbalizes Understanding    Education: Exercise Physiology & General Exercise Guidelines: - Group verbal and written instruction with models to review the exercise physiology of the  cardiovascular system and associated critical values. Provides general exercise guidelines with specific guidelines to those with heart or lung disease. Written material provided at class time. Flowsheet Row Cardiac Rehab from 02/14/2024 in [facility] Cardiac and Pulmonary Rehab  Education need identified 02/14/24    Education: Flexibility, Balance, Mind/Body Relaxation: - Group verbal and visual presentation with interactive activity on the components of exercise prescription. Introduces F.I.T.T principle from ACSM for exercise prescriptions. Reviews F.I.T.T. principles of flexibility and balance exercise training including progression. Also discusses the mind body connection.  Reviews various relaxation techniques to help reduce and manage stress (i.e. Deep breathing, progressive muscle relaxation, and visualization). Balance handout provided to take home. Written material provided at class time.   Activity Barriers & Risk Stratification:  Activity Barriers & Cardiac Risk Stratification - 02/14/24 1027       Activity Barriers & Cardiac Risk Stratification   Activity Barriers Arthritis;Back Problems;Balance Concerns    Cardiac Risk Stratification Moderate          6 Minute Walk:  6 Minute Walk     Row Name 02/14/24 1026         6 Minute Walk   Phase Initial     Distance 1180 feet     Walk Time 6 minutes     # of Rest Breaks 0     MPH 2.2     METS 2.1     RPE 13     Perceived Dyspnea  3     VO2 Peak 7.4     Symptoms Yes (comment)     Comments SOB     Resting HR 72 bpm     Resting BP 114/52     Resting Oxygen Saturation  96 %     Exercise Oxygen Saturation  during 6 min walk 95 %     Max Ex. HR 131 bpm     Max Ex. BP 162/68     2 Minute Post BP 126/66        Oxygen Initial Assessment:   Oxygen Re-Evaluation:  Oxygen Re-Evaluation     Row Name 03/20/24 0945             Program Oxygen Prescription   Program Oxygen Prescription None         Home Oxygen   Home  Oxygen Device None       Sleep Oxygen Prescription CPAP       Home Exercise Oxygen Prescription None       Home Resting Oxygen Prescription None       Compliance with Home Oxygen Use No         Goals/Expected Outcomes   Short Term Goals To learn and demonstrate proper use of respiratory medications       Long  Term Goals Exhibits proper breathing techniques, such as pursed lip breathing or other method taught during program session       Comments Spoke  with Onnie about PLB and she verbalized understanding.       Goals/Expected Outcomes STG: continue to use breathing techniques. LTG: Manages breathing and decreases shortness of breath as needed          Oxygen Discharge (Final Oxygen Re-Evaluation):  Oxygen Re-Evaluation - 03/20/24 0945       Program Oxygen Prescription   Program Oxygen Prescription None      Home Oxygen   Home Oxygen Device None    Sleep Oxygen Prescription CPAP    Home Exercise Oxygen Prescription None    Home Resting Oxygen Prescription None    Compliance with Home Oxygen Use No      Goals/Expected Outcomes   Short Term Goals To learn and demonstrate proper use of respiratory medications    Long  Term Goals Exhibits proper breathing techniques, such as pursed lip breathing or other method taught during program session    Comments Spoke with Ellianna about PLB and she verbalized understanding.    Goals/Expected Outcomes STG: continue to use breathing techniques. LTG: Manages breathing and decreases shortness of breath as needed          Initial Exercise Prescription:  Initial Exercise Prescription - 02/14/24 1000       Date of Initial Exercise RX and Referring Provider   Date 02/14/24    Referring Provider Dr. Alm Clay      Oxygen   Maintain Oxygen Saturation 88% or higher      Treadmill   MPH 2    Grade 0    Minutes 15    METs 2.53      Recumbant Bike   Level 3    RPM 50    Watts 25    Minutes 15    METs 2.1      NuStep   Level 3     SPM 80    Minutes 15    METs 2.1      REL-XR   Level 3    Watts 25    Speed 50    Minutes 15    METs 2.1      T5 Nustep   Level 3    Minutes 15    METs 2.1      Track   Laps 31    Minutes 15    METs 2.69      Prescription Details   Duration Progress to 30 minutes of continuous aerobic without signs/symptoms of physical distress      Intensity   THRR 40-80% of Max Heartrate 97-123    Ratings of Perceived Exertion 11-13    Perceived Dyspnea 0-4      Progression   Progression Continue to progress workloads to maintain intensity without signs/symptoms of physical distress.      Resistance Training   Training Prescription Yes    Weight 4lb    Reps 10-15          Perform Capillary Blood Glucose checks as needed.  Exercise Prescription Changes:   Exercise Prescription Changes     Row Name 02/14/24 1000 03/07/24 0800 03/20/24 0900 03/21/24 1600 04/02/24 1500     Response to Exercise   Blood Pressure (Admit) 114/52 120/70 -- 138/76 142/78   Blood Pressure (Exercise) 162/68 144/62 -- 144/62 162/74   Blood Pressure (Exit) 126/66 122/62 -- 122/62 122/60   Heart Rate (Admit) 72 bpm 131 bpm -- 98 bpm 76 bpm   Heart Rate (Exercise) 131 bpm 132 bpm -- 131 bpm 131  bpm   Heart Rate (Exit) 78 bpm 74 bpm -- 68 bpm 88 bpm   Oxygen Saturation (Admit) 96 % -- -- -- --   Oxygen Saturation (Exercise) 95 % -- -- -- --   Oxygen Saturation (Exit) 95 % -- -- -- --   Rating of Perceived Exertion (Exercise) 13 15 -- 14 13   Perceived Dyspnea (Exercise) 3 -- -- -- --   Symptoms none none -- none none   Comments results 1st 2 weeks of exercise -- -- --   Duration -- Progress to 30 minutes of  aerobic without signs/symptoms of physical distress -- Progress to 30 minutes of  aerobic without signs/symptoms of physical distress Continue with 30 min of aerobic exercise without signs/symptoms of physical distress.   Intensity -- THRR unchanged -- THRR unchanged THRR unchanged      Progression   Progression -- Continue to progress workloads to maintain intensity without signs/symptoms of physical distress. -- Continue to progress workloads to maintain intensity without signs/symptoms of physical distress. Continue to progress workloads to maintain intensity without signs/symptoms of physical distress.   Average METs -- 2.51 -- 2.5 2.33     Resistance Training   Training Prescription -- Yes -- Yes Yes   Weight -- 4lb -- 4lb 4 lb   Reps -- 10-15 -- 10-15 10-15     Interval Training   Interval Training -- No -- No No     Treadmill   MPH -- 1.9 -- 2 --   Grade -- 0 -- 0 --   Minutes -- 15 -- 15 --   METs -- 2.45 -- 2.53 --     NuStep   Level -- 4 -- 4 --   Minutes -- 15 -- 15 --   METs -- 3.5 -- 3.4 --     REL-XR   Level -- 3 -- 3 3   Minutes -- 15 -- 15 15   METs -- 2.8 -- 3.3 3.5     Biostep-RELP   Level -- 3 -- 2 2   SPM -- 50 -- -- --   Minutes -- 15 -- 15 15   METs -- 2 -- 3 2     Track   Laps -- -- -- 15 25   Minutes -- -- -- 15 15   METs -- -- -- 1.82 2.36     Home Exercise Plan   Plans to continue exercise at -- -- [employer] (comment)  [employer] for aerobic machines and strength training (w/ her personal trainer) [employer] (comment)  [employer] for [employer] and 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 Add 1 additional day to program exercise sessions. Add 1 additional day to program exercise sessions.   Initial Home Exercises Provided -- -- 03/20/24 03/20/24 03/20/24     Oxygen   Maintain Oxygen Saturation -- 88% or higher -- 88% or higher 88% or higher    Row Name 04/18/24 1100 05/01/24 1200 05/28/24 1400         Response to Exercise   Blood Pressure (Admit) 138/50 128/62 122/60     Blood Pressure (Exit) 134/60 112/60 120/62     Heart Rate (Admit)  83 bpm 94 bpm 48 bpm     Heart Rate (Exercise) 131 bpm 131 bpm 83 bpm  Heart Rate (Exit) 75 bpm 78 bpm 59 bpm     Oxygen Saturation (Admit) -- -- 95 %     Oxygen Saturation (Exercise) -- -- 95 %     Oxygen Saturation (Exit) -- -- 93 %     Rating of Perceived Exertion (Exercise) 13 13 13      Symptoms none none none     Duration Continue with 30 min of aerobic exercise without signs/symptoms of physical distress. Continue with 30 min of aerobic exercise without signs/symptoms of physical distress. Continue with 30 min of aerobic exercise without signs/symptoms of physical distress.     Intensity THRR unchanged THRR unchanged THRR unchanged       Progression   Progression Continue to progress workloads to maintain intensity without signs/symptoms of physical distress. Continue to progress workloads to maintain intensity without signs/symptoms of physical distress. Continue to progress workloads to maintain intensity without signs/symptoms of physical distress.     Average METs 3.27 2.75 2.7       Resistance Training   Training Prescription Yes -- --     Weight 4 lb 4 lb 4 lb     Reps 10-15 10-15 10-15       Interval Training   Interval Training No No No       NuStep   Level -- 4 4     Minutes -- 15 15     METs -- 3.4 3.1       REL-XR   Level 3 -- 3     Minutes 15 -- 15     METs 6 -- --       Biostep-RELP   Level 2 -- --     Minutes 15 -- --     METs 3 -- --       Track   Laps 28 20 22      Minutes 15 15 15      METs 2.52 2.09 2.2       Home Exercise Plan   Plans to continue exercise at [employer] (comment)  [employer] for aerobic machines and strength training (w/ her personal trainer) [employer] (comment)  [employer] for [employer] and strength training (w/ her systems analyst) [employer] (comment)  [employer] for aerobic machines and strength training (w/ her systems analyst)     Frequency Add 1 additional day to program exercise sessions. Add 1 additional  day to program exercise sessions. Add 1 additional day to program exercise sessions.     Initial Home Exercises Provided 03/20/24 03/20/24 03/20/24       Oxygen   Maintain Oxygen Saturation 88% or higher -- 88% or higher        Exercise Comments:   Exercise Comments     Row Name 02/19/24 9078           Exercise Comments First full day of exercise!  Patient was oriented to gym and equipment including functions, settings, policies, and procedures.  Patient's individual exercise prescription and treatment plan were reviewed.  All starting workloads were established based on the results of the 6 minute walk test done at initial orientation visit.  The plan for exercise progression was also introduced and progression will be customized based on patient's performance and goals.          Exercise Goals and Review:   Exercise Goals     Row Name 02/14/24 1031             Exercise Goals   Increase  Physical Activity Yes       Intervention Provide advice, education, support and counseling about physical activity/exercise needs.;Develop an individualized exercise prescription for aerobic and resistive training based on initial evaluation findings, risk stratification, comorbidities and participant's personal goals.       Expected Outcomes Short Term: Attend rehab on a regular basis to increase amount of physical activity.;Long Term: Exercising regularly at least 3-5 days a week.;Long Term: Add in home exercise to make exercise part of routine and to increase amount of physical activity.       Increase Strength and Stamina Yes       Intervention Provide advice, education, support and counseling about physical activity/exercise needs.;Develop an individualized exercise prescription for aerobic and resistive training based on initial evaluation findings, risk stratification, comorbidities and participant's personal goals.       Expected Outcomes Short Term: Increase workloads from initial  exercise prescription for resistance, speed, and METs.;Short Term: Perform resistance training exercises routinely during rehab and add in resistance training at home;Long Term: Improve cardiorespiratory fitness, muscular endurance and strength as measured by increased METs and functional capacity ( )       Able to understand and use rate of perceived exertion (RPE) scale Yes       Intervention Provide education and explanation on how to use RPE scale       Expected Outcomes Short Term: Able to use RPE daily in rehab to express subjective intensity level;Long Term:  Able to use RPE to guide intensity level when exercising independently       Able to understand and use Dyspnea scale Yes       Intervention Provide education and explanation on how to use Dyspnea scale       Expected Outcomes Short Term: Able to use Dyspnea scale daily in rehab to express subjective sense of shortness of breath during exertion;Long Term: Able to use Dyspnea scale to guide intensity level when exercising independently       Knowledge and understanding of Target Heart Rate Range (THRR) Yes       Intervention Provide education and explanation of THRR including how the numbers were predicted and where they are located for reference       Expected Outcomes Short Term: Able to state/look up THRR;Long Term: Able to use THRR to govern intensity when exercising independently;Short Term: Able to use daily as guideline for intensity in rehab       Able to check pulse independently Yes       Intervention Provide education and demonstration on how to check pulse in carotid and radial arteries.;Review the importance of being able to check your own pulse for safety during independent exercise       Expected Outcomes Short Term: Able to explain why pulse checking is important during independent exercise;Long Term: Able to check pulse independently and accurately       Understanding of Exercise Prescription Yes       Intervention  Provide education, explanation, and written materials on patient's individual exercise prescription       Expected Outcomes Short Term: Able to explain program exercise prescription;Long Term: Able to explain home exercise prescription to exercise independently          Exercise Goals Re-Evaluation :  Exercise Goals Re-Evaluation     Row Name 02/19/24 0921 03/07/24 0808 03/20/24 0957 03/21/24 1628 04/02/24 1514     Exercise Goal Re-Evaluation   Exercise Goals Review Increase Physical Activity;Able to understand and use rate  of perceived exertion (RPE) scale;Knowledge and understanding of Target Heart Rate Range (THRR);Understanding of Exercise Prescription;Increase Strength and Stamina;Able to understand and use Dyspnea scale;Able to check pulse independently Increase Physical Activity;Understanding of Exercise Prescription;Increase Strength and Stamina Increase Physical Activity;Able to understand and use Dyspnea scale;Understanding of Exercise Prescription;Increase Strength and Stamina;Knowledge and understanding of Target Heart Rate Range (THRR);Able to understand and use rate of perceived exertion (RPE) scale;Able to check pulse independently Increase Physical Activity;Increase Strength and Stamina;Understanding of Exercise Prescription Increase Physical Activity;Increase Strength and Stamina;Understanding of Exercise Prescription   Comments Reviewed RPE and dyspnea scale, THR and program prescription with pt today.  Pt voiced understanding and was given a copy of goals to take home. Nailani is off to a good start in the program and she completed her first 2 weeks in this review. She worked at level 4 on the T4 nustep, level 3 on the biostep, and level 3 on the XR. She had a workload on the treadmill of a speed of 1.9 mph with no incline. We will continue to monitor her progress in the program. Reviewed home exercise with pt today.  Pt plans to go to the [facility] for aerobic machines and strength training  with her personal trainer for exercise. She plans to start back with 1 additional day of exercise at home.  Reviewed THR, pulse, RPE, sign and symptoms, pulse oximetery and when to call 911 or MD.  Also discussed weather considerations and indoor options.  Pt voiced understanding. Yamilett continues to do well in rehab. She has been able to increase her speed on the treadmill from 1.9mph to 2mph. She was also able to maintain level 4 on the T4 nustep and level 3 on the XR. We will continue to monitor her progress in the program. Nakiea continues to do well in rehab. She has been able to increase her laps on the track to 25 laps walked. She also continues to work at level 3 on the XR and level 2 on the biostep. We will continue to monitor her progress in the program.   Expected Outcomes Short: Use RPE daily to regulate intensity. Long: Follow program prescription in THR. Short: Continue to follow current exercise prescription. Long: Continue exercise to improve strength and stamina. Short: Add 1 additional day of exercise at home. Long: Continue to exercise independently. Short: Continue to increase treadmill workload. Long: Continue exercise to improve strength and stamina. Short: Increase to level 3 on the biostep. Long: Continue exercise to improve strength and stamina.    Row Name 04/18/24 1107 05/01/24 1205 05/21/24 1435 05/22/24 1036 05/28/24 1438     Exercise Goal Re-Evaluation   Exercise Goals Review Increase Physical Activity;Increase Strength and Stamina;Understanding of Exercise Prescription Increase Physical Activity;Increase Strength and Stamina;Understanding of Exercise Prescription Increase Physical Activity;Increase Strength and Stamina;Understanding of Exercise Prescription Increase Physical Activity;Increase Strength and Stamina;Understanding of Exercise Prescription Increase Physical Activity;Increase Strength and Stamina;Understanding of Exercise Prescription   Comments Atoya is doing well in  rehab. She was able to increase her laps on the track back up to 28 laps walked. She also continues to work at level 3 on the XR and level 2 on the biostep. We will continue to monitor her progress in the program. Liandra is doing well in rehab. She only attended 1 session since the last review period. She walked 20 laps on the track and worked at level 4 on the T4 nustep. We will continue to monitor her progress in the program. Marlow  did not attend rehab during the last review. We will continue to monitor her progress when she returns to the program. Demetrica states that she plans to get back to her home exercise this week after being sick all through the holidays. She would like to get back to walking, but she has been unsteady and dizzy when she walks and she had an MRI yesterday to figure out what is going on. She also plans to return to the [facility] for her home exercise. Wednesday is doing well in rehab, and was able to attend 2 sessions during this review period. During these sessions she was able to use the T4 nustep at level 4, and walk 22 laps on the track. We will continue to monitor her progress in the program.   Expected Outcomes Short: Continue to push for more laps on the track. Long: Continue exercise to improve strength and stamina. Short: Attend consistently. Long: Continue exercise to improve strength and stamina. Short: Return to regular attendence in the program. Long: Continue exercise to improve strength and stamina. Short: Get back to home exercise routine. Long: Continue exercise to improve strength and stamina. Short: Increase to level 5 on the T5 nustep. Long: Continue exercise to improve strength and stamina.      Discharge Exercise Prescription (Final Exercise Prescription Changes):  Exercise Prescription Changes - 05/28/24 1400       Response to Exercise   Blood Pressure (Admit) 122/60    Blood Pressure (Exit) 120/62    Heart Rate (Admit) 48 bpm    Heart Rate (Exercise) 83 bpm    Heart  Rate (Exit) 59 bpm    Oxygen Saturation (Admit) 95 %    Oxygen Saturation (Exercise) 95 %    Oxygen Saturation (Exit) 93 %    Rating of Perceived Exertion (Exercise) 13    Symptoms none    Duration Continue with 30 min of aerobic exercise without signs/symptoms of physical distress.    Intensity THRR unchanged      Progression   Progression Continue to progress workloads to maintain intensity without signs/symptoms of physical distress.    Average METs 2.7      Resistance Training   Weight 4 lb    Reps 10-15      Interval Training   Interval Training No      NuStep   Level 4    Minutes 15    METs 3.1      REL-XR   Level 3    Minutes 15      Track   Laps 22    Minutes 15    METs 2.2      Home Exercise Plan   Plans to continue exercise at [employer] (comment)   [employer] for aerobic machines and strength training (w/ her personal trainer)   Frequency Add 1 additional day to program exercise sessions.    Initial Home Exercises Provided 03/20/24      Oxygen   Maintain Oxygen Saturation 88% or higher          Nutrition:  Target Goals: Understanding of nutrition guidelines, daily intake of sodium 1500mg , cholesterol 200mg , calories 30% from fat and 7% or less from saturated fats, daily to have 5 or more servings of fruits and vegetables.  Education: Nutrition 1 -Group instruction provided by verbal, written material, interactive activities, discussions, models, and posters to present general guidelines for heart healthy nutrition including macronutrients, label reading, and promoting whole foods over processed counterparts. Education serves as  foundation of discussion of heart healthy eating for all. Written material provided at class time.    Education: Nutrition 2 -Group instruction provided by verbal, written material, interactive activities, discussions, models, and posters to present general guidelines for heart healthy nutrition including sodium,  cholesterol, and saturated fat. Providing guidance of habit forming to improve blood pressure, cholesterol, and body weight. Written material provided at class time.     Biometrics:  Pre Biometrics - 02/14/24 1032       Pre Biometrics   Height 5' 6.2 (1.681 m)    Weight 215 lb 3.2 oz (97.6 kg)    Waist Circumference 40 inches    Hip Circumference 47 inches    Waist to Hip Ratio 0.85 %    BMI (Calculated) 34.54    Single Leg Stand 3 seconds           Nutrition Therapy Plan and Nutrition Goals:   Nutrition Assessments:  MEDIFICTS Score Key: >=70 Need to make dietary changes  40-70 Heart Healthy Diet <= 40 Therapeutic Level Cholesterol Diet  Flowsheet Row Cardiac Rehab from 02/14/2024 in [facility] Cardiac [facility]  Picture Your Plate Total Score on Admission 54   Picture Your Plate Scores: <59 Unhealthy dietary pattern with much room for improvement. 41-50 Dietary pattern unlikely to meet recommendations for good health and room for improvement. 51-60 More healthful dietary pattern, with some room for improvement.  >60 Healthy dietary pattern, although there may be some specific behaviors that could be improved.    Nutrition Goals Re-Evaluation:  Nutrition Goals Re-Evaluation     Row Name 03/20/24 0948 05/22/24 1041           Goals   Current Weight -- 214 lb 9.6 oz (97.3 kg)      Comment Spoke with Marda about eating smaller meals during the day and making sure she eats nutrient dense foods to help her meet her nutrition goals Makhya states she is doing well with eating smaller meals throughout the day and plans to get back to tracking and cooking regularly. She has incorporated more nutrient dense foods. She states she is back on her routine with chicken and veggies. She is having trouble watching her sodium level, and we discussed reading food labels and what to look for and compare at the grocery store.      Expected Outcome STG: Continue to focus on protien  and read labels controlling sodium intake LTG: Follow a healthy diet that supports increased needs for pulmonary health Short: Start to read food labels controlling sodium intake Long: Follow a heart healthy diet         Nutrition Goals Discharge (Final Nutrition Goals Re-Evaluation):  Nutrition Goals Re-Evaluation - 05/22/24 1041       Goals   Current Weight 214 lb 9.6 oz (97.3 kg)    Comment Logen states she is doing well with eating smaller meals throughout the day and plans to get back to tracking and cooking regularly. She has incorporated more nutrient dense foods. She states she is back on her routine with chicken and veggies. She is having trouble watching her sodium level, and we discussed reading food labels and what to look for and compare at the grocery store.    Expected Outcome Short: Start to read food labels controlling sodium intake Long: Follow a heart healthy diet          Psychosocial: Target Goals: Acknowledge presence or absence of significant depression and/or stress, maximize coping skills,  provide positive support system. Participant is able to verbalize types and ability to use techniques and skills needed for reducing stress and depression.   Education: Stress, Anxiety, and Depression - Group verbal and visual presentation to define topics covered.  Reviews how body is impacted by stress, anxiety, and depression.  Also discusses healthy ways to reduce stress and to treat/manage anxiety and depression. Written material provided at class time.   Education: Sleep Hygiene -Provides group verbal and written instruction about how sleep can affect your health.  Define sleep hygiene, discuss sleep cycles and impact of sleep habits. Review good sleep hygiene tips.   Initial Review & Psychosocial Screening:  Initial Psych Review & Screening - 02/07/24 1014       Initial Review   Current issues with None Identified      Family Dynamics   Good Support System? Yes       Barriers   Psychosocial barriers to participate in program The patient should benefit from training in stress management and relaxation.;There are no identifiable barriers or psychosocial needs.      Screening Interventions   Interventions Encouraged to exercise;To provide support and resources with identified psychosocial needs;Provide feedback about the scores to participant    Expected Outcomes Short Term goal: Utilizing psychosocial counselor, staff and physician to assist with identification of specific Stressors or current issues interfering with healing process. Setting desired goal for each stressor or current issue identified.;Long Term Goal: Stressors or current issues are controlled or eliminated.;Short Term goal: Identification and review with participant of any Quality of Life or Depression concerns found by scoring the questionnaire.;Long Term goal: The participant improves quality of Life and PHQ9 Scores as seen by post scores and/or verbalization of changes          Quality of Life Scores:   Quality of Life - 02/14/24 1032       Quality of Life   Select Quality of Life      Quality of Life Scores   Health/Function Pre 20.47 %    Socioeconomic Pre 26.25 %    Psych/Spiritual Pre 30 %    Family Pre 21.6 %    GLOBAL Pre 23.86 %         Scores of 19 and below usually indicate a poorer quality of life in these areas.  A difference of  2-3 points is a clinically meaningful difference.  A difference of 2-3 points in the total score of the Quality of Life Index has been associated with significant improvement in overall quality of life, self-image, physical symptoms, and general health in studies assessing change in quality of life.  PHQ-9: Review Flowsheet  More data exists      02/14/2024 01/17/2024 01/11/2024 12/07/2023 11/10/2023  Depression screen PHQ 2/9  Decreased Interest 0 0 0 0 0  Down, Depressed, Hopeless 0 0 0 0 0  PHQ - 2 Score 0 0 0 0 0  Altered sleeping 0  0 1 0 -  Tired, decreased energy 1 0 1 1 -  Change in appetite 0 0 1 0 -  Feeling bad or failure about yourself  0 0 - 0 -  Trouble concentrating 0 0 0 0 -  Moving slowly or fidgety/restless 0 0 0 0 -  Suicidal thoughts 0 0 0 0 -  PHQ-9 Score 1  0  3  1  -  Difficult doing work/chores Not difficult at all Not difficult at all Somewhat difficult Somewhat difficult -  Details       Data saved with a previous flowsheet row definition        Interpretation of Total Score  Total Score Depression Severity:  1-4 = Minimal depression, 5-9 = Mild depression, 10-14 = Moderate depression, 15-19 = Moderately severe depression, 20-27 = Severe depression   Psychosocial Evaluation and Intervention:  Psychosocial Evaluation - 02/07/24 1026       Psychosocial Evaluation & Interventions   Interventions Encouraged to exercise with the program and follow exercise prescription    Comments Ms. Channell is coming to cardiac rehab after a NSTEMI. When asked about stress, she states she is under a normal amount of stress that any body else would be and she feels like she is handling it well. She reports no sleep concerns either. She is ready to start the program.    Expected Outcomes Short: attend cardiac rehab for education and exercise Long: develop and maintain positive self care habits    Continue Psychosocial Services  Follow up required by staff          Psychosocial Re-Evaluation:  Psychosocial Re-Evaluation     Row Name 03/20/24 0946 05/22/24 1038           Psychosocial Re-Evaluation   Current issues with Current Sleep Concerns Current Stress Concerns      Comments Rakisha reports she has struggled with sleep, but lately she has gotten more used to wearing her CPAP and says it has helped alot. Ilaria reports the only stessor currently is this unsteadiness that she experiences and she had an MRI yesterday to see what is going on. She hopes she has some answers soon. She has ways to relax,  especially doing puzzles, which helps reduce her stress. She has had good sleep lately and regularly uses her CPAP. She also states she has a good support system with her 5 children.      Expected Outcomes STG: Continue to wear CPAP. LTG: achieve and maintain positive outlook on health and daily life STG: Continue to manage any stress that arises. LTG: achieve and maintain positive outlook on health and daily life      Interventions Encouraged to attend Pulmonary Rehabilitation for the exercise Encouraged to attend Pulmonary Rehabilitation for the exercise      Continue Psychosocial Services  Follow up required by staff Follow up required by staff         Psychosocial Discharge (Final Psychosocial Re-Evaluation):  Psychosocial Re-Evaluation - 05/22/24 1038       Psychosocial Re-Evaluation   Current issues with Current Stress Concerns    Comments Saquoia reports the only stessor currently is this unsteadiness that she experiences and she had an MRI yesterday to see what is going on. She hopes she has some answers soon. She has ways to relax, especially doing puzzles, which helps reduce her stress. She has had good sleep lately and regularly uses her CPAP. She also states she has a good support system with her 5 children.    Expected Outcomes STG: Continue to manage any stress that arises. LTG: achieve and maintain positive outlook on health and daily life    Interventions Encouraged to attend Pulmonary Rehabilitation for the exercise    Continue Psychosocial Services  Follow up required by staff          Vocational Rehabilitation: Provide vocational rehab assistance to qualifying candidates.   Vocational Rehab Evaluation & Intervention:   Education: Education Goals: Education classes will be provided on a variety of  topics geared toward better understanding of heart health and risk factor modification. Participant will state understanding/return demonstration of topics presented as noted by  education test scores.  Learning Barriers/Preferences:  Learning Barriers/Preferences - 02/07/24 1026       Learning Barriers/Preferences   Learning Barriers None    Learning Preferences None          General Cardiac Education Topics:  AED/CPR: - Group verbal and written instruction with the use of models to demonstrate the basic use of the AED with the basic ABC's of resuscitation.   Test and Procedures: - Group verbal and visual presentation and models provide information about basic cardiac anatomy and function. Reviews the testing methods done to diagnose heart disease and the outcomes of the test results. Describes the treatment choices: Medical Management, Angioplasty, or Coronary Bypass Surgery for treating various heart conditions including Myocardial Infarction, Angina, Valve Disease, and Cardiac Arrhythmias. Written material provided at class time.   Medication Safety: - Group verbal and visual instruction to review commonly prescribed medications for heart and lung disease. Reviews the medication, class of the drug, and side effects. Includes the steps to properly store meds and maintain the prescription regimen. Written material provided at class time.   Intimacy: - Group verbal instruction through game format to discuss how heart and lung disease can affect sexual intimacy. Written material provided at class time.   Know Your Numbers and Heart Failure: - Group verbal and visual instruction to discuss disease risk factors for cardiac and pulmonary disease and treatment options.  Reviews associated critical values for Overweight/Obesity, Hypertension, Cholesterol, and Diabetes.  Discusses basics of heart failure: signs/symptoms and treatments.  Introduces Heart Failure Zone chart for action plan for heart failure. Written material provided at class time.   Infection Prevention: - Provides verbal and written material to individual with discussion of infection control  including proper hand washing and proper equipment cleaning during exercise session. Flowsheet Row Cardiac Rehab from 02/14/2024 in [facility] Cardiac and Pulmonary Rehab  Date 02/14/24  Educator [facility]  Instruction Review Code 1- Verbalizes Understanding    Falls Prevention: - Provides verbal and written material to individual with discussion of falls prevention and safety. Flowsheet Row Cardiac Rehab from 02/14/2024 in [facility] Cardiac and Pulmonary Rehab  Date 02/14/24  Educator [facility]  Instruction Review Code 1- Verbalizes Understanding    Other: -Provides group and verbal instruction on various topics (see comments)   Knowledge Questionnaire Score:  Knowledge Questionnaire Score - 02/14/24 1033       Knowledge Questionnaire Score   Pre Score 23/26          Core Components/Risk Factors/Patient Goals at Admission:  Personal Goals and Risk Factors at Admission - 02/07/24 1013       Core Components/Risk Factors/Patient Goals on Admission    Weight Management Yes;Weight Loss    Intervention Weight Management: Develop a combined nutrition and exercise program designed to reach desired caloric intake, while maintaining appropriate intake of nutrient and fiber, sodium and fats, and appropriate energy expenditure required for the weight goal.;Weight Management: Provide education and appropriate resources to help participant work on and attain dietary goals.;Weight Management/Obesity: Establish reasonable short term and long term weight goals.;Obesity: Provide education and appropriate resources to help participant work on and attain dietary goals.    Expected Outcomes Short Term: Continue to assess and modify interventions until short term weight is achieved;Long Term: Adherence to nutrition and physical activity/exercise program aimed toward attainment of established weight goal;Weight Maintenance:  Understanding of the daily nutrition guidelines, which includes 25-35% calories from fat, 7% or less cal  from saturated fats, less than 200mg  cholesterol, less than 1.5gm of sodium, & 5 or more servings of fruits and vegetables daily;Weight Loss: Understanding of general recommendations for a balanced deficit meal plan, which promotes 1-2 lb weight loss per week and includes a negative energy balance of [phone] kcal/d;Understanding recommendations for meals to include 15-35% energy as protein, 25-35% energy from fat, 35-60% energy from carbohydrates, less than 200mg  of dietary cholesterol, 20-35 gm of total fiber daily;Understanding of distribution of calorie intake throughout the day with the consumption of 4-5 meals/snacks    Improve shortness of breath with ADL's Yes    Intervention Provide education, individualized exercise plan and daily activity instruction to help decrease symptoms of SOB with activities of daily living.    Expected Outcomes Short Term: Improve cardiorespiratory fitness to achieve a reduction of symptoms when performing ADLs;Long Term: Be able to perform more ADLs without symptoms or delay the onset of symptoms    Heart Failure Yes    Intervention Provide a combined exercise and nutrition program that is supplemented with education, support and counseling about heart failure. Directed toward relieving symptoms such as shortness of breath, decreased exercise tolerance, and extremity edema.    Expected Outcomes Improve functional capacity of life;Short term: Attendance in program 2-3 days a week with increased exercise capacity. Reported lower sodium intake. Reported increased fruit and vegetable intake. Reports medication compliance.;Short term: Daily weights obtained and reported for increase. Utilizing diuretic protocols set by physician.;Long term: Adoption of self-care skills and reduction of barriers for early signs and symptoms recognition and intervention leading to self-care maintenance.    Hypertension Yes    Intervention Provide education on lifestyle modifcations including  regular physical activity/exercise, weight management, moderate sodium restriction and increased consumption of fresh fruit, vegetables, and low fat dairy, alcohol moderation, and smoking cessation.;Monitor prescription use compliance.    Expected Outcomes Short Term: Continued assessment and intervention until BP is < 140/103mm HG in hypertensive participants. < 130/36mm HG in hypertensive participants with diabetes, heart failure or chronic kidney disease.;Long Term: Maintenance of blood pressure at goal levels.    Lipids Yes    Intervention Provide education and support for participant on nutrition & aerobic/resistive exercise along with prescribed medications to achieve LDL 70mg , HDL >40mg .    Expected Outcomes Short Term: Participant states understanding of desired cholesterol values and is compliant with medications prescribed. Participant is following exercise prescription and nutrition guidelines.;Long Term: Cholesterol controlled with medications as prescribed, with individualized exercise RX and with personalized nutrition plan. Value goals: LDL < 70mg , HDL > 40 mg.          Education:Diabetes - Individual verbal and written instruction to review signs/symptoms of diabetes, desired ranges of glucose level fasting, after meals and with exercise. Acknowledge that pre and post exercise glucose checks will be done for 3 sessions at entry of program.   Core Components/Risk Factors/Patient Goals Review:   Goals and Risk Factor Review     Row Name 03/20/24 0949 05/22/24 1044           Core Components/Risk Factors/Patient Goals Review   Personal Goals Review Improve shortness of breath with ADL's Weight Management/Obesity;Heart Failure;Hypertension;Lipids      Review Leyan reports coming to rehab has helped her perform her ADLs better and with less shortness of breath. Jenny reports she is having difficulty with her weight loss goal with being sick  and the holiday season. She plans to get  rid of any sugary items from the holidays and get back to her routine. She weighed 214.6 lbs today. She is taking her medications regularly. She is not monitoring her blood pressure or weight at home. We also discussed looking for more healthy fats and reduce saturated fats for her lipid levels.      Expected Outcomes STG: Work with EP today to [employer] home exercise plan. LTG: exercise independently and practice breathing techniques taught at pulmonary rehab. Short: Monitor weight at home. Long: Continue reducing cardiovascular risk factors.         Core Components/Risk Factors/Patient Goals at Discharge (Final Review):   Goals and Risk Factor Review - 05/22/24 1044       Core Components/Risk Factors/Patient Goals Review   Personal Goals Review Weight Management/Obesity;Heart Failure;Hypertension;Lipids    Review Catelyn reports she is having difficulty with her weight loss goal with being sick and the holiday season. She plans to get rid of any sugary items from the holidays and get back to her routine. She weighed 214.6 lbs today. She is taking her medications regularly. She is not monitoring her blood pressure or weight at home. We also discussed looking for more healthy fats and reduce saturated fats for her lipid levels.    Expected Outcomes Short: Monitor weight at home. Long: Continue reducing cardiovascular risk factors.          ITP Comments:  ITP Comments     Row Name 02/07/24 1019 02/14/24 1026 02/19/24 0921 03/06/24 1002 04/03/24 0945   ITP Comments Initial phone call completed. Diagnosis can be found in [facility] 9/9. EP Orientation scheduled for Wednesday 10/1 at 9am. Completed and gym orientation for cardiac rehab. Initial ITP created and sent for review to Dr. Oneil Pinal, Medical Director. First full day of exercise!  Patient was oriented to gym and equipment including functions, settings, policies, and procedures.  Patient's individual exercise prescription and treatment plan  were reviewed.  All starting workloads were established based on the results of the 6 minute walk test done at initial orientation visit.  The plan for exercise progression was also introduced and progression will be customized based on patient's performance and goals. 30 Day review completed. Medical Director ITP review done, changes made as directed, and signed approval by Medical Director. 30 Day review completed. Medical Director ITP review done, changes made as directed, and signed approval by Medical Director.    Row Name 05/01/24 0754 05/29/24 0950         ITP Comments 30 Day review completed. Medical Director ITP review done, changes made as directed, and signed approval by Medical Director. 30 Day review completed. Medical Director ITP review done, changes made as directed, and signed approval by Medical Director.         Comments: 30 Day Review     [1]  Current Outpatient Medications:    allopurinol  (ZYLOPRIM ) 100 MG tablet, TAKE ONE TABLET BY MOUTH EVERY DAY, Disp: 90 tablet, Rfl: 1   atorvastatin  (LIPITOR ) 80 MG tablet, TAKE 1 TABLET BY MOUTH DAILY, Disp: 90 tablet, Rfl: 1   calcium  carbonate (TUMS EX) 750 MG chewable tablet, Chew 2 tablets by mouth daily as needed for heartburn., Disp: , Rfl:    Cholecalciferol  25 MCG (1000 UT) tablet, Take 1,000 Units by mouth daily., Disp: , Rfl:    cholestyramine (QUESTRAN) 4 g packet, Take 4 g by mouth., Disp: , Rfl:    dapagliflozin  propanediol (  FARXIGA ) 10 MG TABS tablet, Take 1 tablet (10 mg total) by mouth daily before breakfast., Disp: 30 tablet, Rfl: 6   ELIQUIS  5 MG TABS tablet, TAKE ONE TABLET TWICE DAILY, Disp: 180 tablet, Rfl: 1   ezetimibe  (ZETIA ) 10 MG tablet, TAKE 1 TABLET BY MOUTH DAILY, Disp: 90 tablet, Rfl: 3   furosemide  (LASIX ) 40 MG tablet, TAKE 1 TABLET BY MOUTH DAILY. TAKE AN EXTRA TABLET AS NEEDED AFTER LUNCH FOR ABDOMINAL SWELLING, LEG SWELLING OR SHORTNESS OF BREATH, Disp: 180 tablet, Rfl: 1   gabapentin  (NEURONTIN )  600 MG tablet, Take 1 tablet (600 mg total) by mouth at bedtime., Disp: 90 tablet, Rfl: 3   Magnesium  300 MG TABS, Take 300 mg by mouth daily., Disp: , Rfl:    Menthol, Topical Analgesic, (BIOFREEZE EX), Apply 1 application. topically daily as needed (Neck pain)., Disp: , Rfl:    Multiple Vitamin (MULTIVITAMIN) capsule, Take 1 capsule by mouth daily., Disp: , Rfl:    nitroGLYCERIN  (NITROSTAT ) 0.4 MG SL tablet, Place 1 tablet (0.4 mg total) under the tongue every 5 (five) minutes as needed for chest pain., Disp: 30 tablet, Rfl: 12   oxyCODONE  (ROXICODONE ) 5 MG immediate release tablet, Take 1 tablet (5 mg total) by mouth every 8 (eight) hours as needed for up to 8 doses., Disp: 8 tablet, Rfl: 0   potassium chloride  (KLOR-CON ) 10 MEQ tablet, Take 2 tablets (20 mEq total) by mouth daily., Disp: 60 tablet, Rfl: 10   Tafamidis  (VYNDAMAX ) 61 MG CAPS, Take 1 capsule (61 mg total) by mouth daily., Disp: 30 capsule, Rfl: 11   Tiotropium Bromide -Olodaterol (STIOLTO RESPIMAT ) 2.5-2.5 MCG/ACT AERS, Inhale 2 puffs into the lungs daily as needed., Disp: 3 each, Rfl: 4 [2]  Social History Tobacco Use  Smoking Status Former   Current packs/day: 0.00   Average packs/day: 1 pack/day for 30.0 years (30.0 ttl pk-yrs)   Types: Cigarettes   Start date: 05/17/1959   Quit date: 05/16/1989   Years since quitting: 35.0   Passive exposure: Past  Smokeless Tobacco Never

## 2024-05-29 NOTE — Progress Notes (Signed)
 Patient is doing well, she exercises 3 times a week.

## 2024-05-29 NOTE — Progress Notes (Signed)
 " [facility]  Telephone:([phone] Fax:(336) [phone]  ID: Lauren Lloyd OB: April 22, 1941  MR#: [id number]  RDW#:[id number]  Patient Care Team: Myrla Jon HERO, MD as PCP - General (Family Medicine) Perla Evalene PARAS, MD as PCP - Cardiology (Cardiology) Cindie Ole DASEN, MD (Inactive) as PCP - Electrophysiology (Cardiology) Avanell Katz, MD as Referring Physician (Physical Medicine and Rehabilitation) Parris Manna, MD as Consulting Physician (Pulmonary Disease) Dasher, Alm LABOR, MD (Dermatology) Midge Sober, DO as Referring Physician (Family Medicine) Jacobo Evalene PARAS, MD as Consulting Physician (Oncology) Pa, [facility] (Optometry)  CHIEF COMPLAINT: MGUS.  INTERVAL HISTORY: Patient returns to clinic today for repeat laboratory work and routine 31-month evaluation.  She continues to feel well and remains asymptomatic.  She does not complain of dizziness today.  She has no neurologic complaints.  She denies any recent fevers or illnesses.  She has a good appetite and denies weight loss.  She has no chest pain, shortness of breath, cough, or hemoptysis.  She denies any nausea, vomiting, constipation, or diarrhea.  She has no urinary complaints.  Patient offers no specific complaints today.    REVIEW OF SYSTEMS:   Review of Systems  Constitutional: Negative.  Negative for fever, malaise/fatigue and weight loss.  Respiratory: Negative.  Negative for cough, hemoptysis and shortness of breath.   Cardiovascular: Negative.  Negative for chest pain and leg swelling.  Gastrointestinal: Negative.  Negative for abdominal pain.  Genitourinary: Negative.  Negative for dysuria.  Musculoskeletal: Negative.  Negative for back pain.  Skin: Negative.  Negative for rash.  Neurological: Negative.  Negative for dizziness, focal weakness, weakness and headaches.  Psychiatric/Behavioral: Negative.  The patient is not nervous/anxious.     As per HPI. Otherwise,  a complete review of systems is negative.  PAST MEDICAL HISTORY: Past Medical History:  Diagnosis Date   (HFpEF) heart failure with preserved ejection fraction (HCC)    a. 05/2018 Echo: EF 55-60%, no rwma, mild to mod MR. Nl RV fxn. Mod TR. PASP .   Arthritis    knees, Hands   Arthritis of knee    Back pain    Carotid arterial disease    a. 03/2019 Carotid U/S: <50% bilat ICA stenoses.   CHF (congestive heart failure) (HCC)    Cholelithiasis    a. 10/2018 noted on CT.   Edema, lower extremity    Fatty liver    GERD (gastroesophageal reflux disease)    History of stress test    a. 06/2018 MV: EF 59%, no ischemia/infarct. Low risk.   Hypertension    Knee pain    Lactose intolerance    Mitral regurgitation    a. 05/2018 Echo: mild to mod MR.   Multinodular goiter    Obesity    OSA (obstructive sleep apnea)    PAF (paroxysmal atrial fibrillation) (HCC)    a.  Diagnosed 12/19; b. 05/2018 s/p DCCV; c. 03/2019 & 05/2019 recurrent AFib-->managed w/ amio load; d. CHADS2VASc = 6 (CHF, HTN, age x 2, vascular disease, female)-->Eliquis  & amio 100 qd.   PAH (pulmonary artery hypertension) (HCC)    RSV (acute bronchiolitis due to respiratory syncytial virus) 05/10/2022   Scoliosis    SOB (shortness of breath)    Swallowing difficulty     PAST SURGICAL HISTORY: Past Surgical History:  Procedure Laterality Date   AV NODE ABLATION N/A 07/05/2022   Procedure: AV NODE ABLATION;  Surgeon: Cindie Ole DASEN, MD;  [facility]: [facility] INVASIVE CV LAB;  Service:  Cardiovascular;  Laterality: N/A;   CARDIOVERSION N/A 06/15/2018   Procedure: CARDIOVERSION (CATH LAB);  Surgeon: Perla Evalene PARAS, MD;  Location: [facility] ORS;  Service: Cardiovascular;  Laterality: N/A;   CARDIOVERSION N/A 02/18/2021   Procedure: CARDIOVERSION;  Surgeon: Perla Evalene PARAS, MD;  Location: [facility] ORS;  Service: Cardiovascular;  Laterality: N/A;   CATARACT EXTRACTION W/PHACO Right 01/25/2016   Procedure: CATARACT EXTRACTION PHACO  AND INTRAOCULAR LENS PLACEMENT (IOC);  Surgeon: Donzell Arlyce Budd, MD;  Location: [facility];  Service: Ophthalmology;  Laterality: Right;  RIGHT   CATARACT EXTRACTION W/PHACO Left 02/22/2016   Procedure: CATARACT EXTRACTION PHACO AND INTRAOCULAR LENS PLACEMENT (IOC);  Surgeon: Donzell Arlyce Budd, MD;  Location: [facility];  Service: Ophthalmology;  Laterality: Left;  LEFT   COLONOSCOPY N/A 01/22/2024   Procedure: COLONOSCOPY;  Surgeon: Therisa Bi, MD;  Location: [facility] ENDOSCOPY;  Service: Gastroenterology;  Laterality: N/A;   HAMMER TOE SURGERY  05/16/2008   KNEE ARTHROSCOPY Right 05/16/2002   LEFT HEART CATH AND CORONARY ANGIOGRAPHY N/A 01/25/2024   Procedure: LEFT HEART CATH AND CORONARY ANGIOGRAPHY;  Surgeon: Anner Alm ORN, MD;  Location: [facility] INVASIVE CV LAB;  Service: Cardiovascular;  Laterality: N/A;   PACEMAKER IMPLANT N/A 10/25/2021   Procedure: PACEMAKER IMPLANT;  Surgeon: Cindie Ole DASEN, MD;  Location: [facility] INVASIVE CV LAB;  Service: Cardiovascular;  Laterality: N/A;   PARTIAL HIP ARTHROPLASTY Left 12/17/2021   POLYPECTOMY  01/22/2024   Procedure: POLYPECTOMY, INTESTINE;  Surgeon: Therisa Bi, MD;  Location: [facility];  Service: Gastroenterology;;   REPLACEMENT TOTAL KNEE Right 05/17/2007   [facility]   SKIN GRAFT Left 04/08/2013   Done on left index finger   TONSILLECTOMY  05/16/1944   TOTAL KNEE ARTHROPLASTY Left 12/02/2020    FAMILY HISTORY: Family History  Problem Relation Age of Onset   Hyperlipidemia Sister    Atrial fibrillation Sister    Transient ischemic attack Mother    Heart disease Mother    Heart attack Father    High blood pressure Father    Stroke Father    Healthy Brother    Breast cancer Sister 78   Hyperlipidemia Sister    Atrial fibrillation Sister     ADVANCED DIRECTIVES (Y/N):  N  HEALTH MAINTENANCE: Social History   Tobacco Use   Smoking status: Former    Current packs/day: 0.00     Average packs/day: 1 pack/day for 30.0 years (30.0 ttl pk-yrs)    Types: Cigarettes    Start date: 05/17/1959    Quit date: 05/16/1989    Years since quitting: 35.0    Passive exposure: Past   Smokeless tobacco: Never  Vaping Use   Vaping status: Never Used  Substance Use Topics   Alcohol use: Yes    Alcohol/week: 14.0 standard drinks of alcohol    Types: 14 Glasses of wine per week   Drug use: No     Colonoscopy:  PAP:  Bone density:  Lipid panel:  Allergies  Allergen Reactions   Levofloxacin Other (See Comments)    Other reaction(s): Joint Pains   Influenza Vaccine Recombinant Other (See Comments)   Influenza Vaccines Other (See Comments)    Bell's Palsy   Oysters [Shellfish Allergy] Swelling    She states she had eaten them three days in a row and she developed swelling around her eyes.     Current Outpatient Medications  Medication Sig Dispense Refill   allopurinol  (ZYLOPRIM ) 100 MG tablet TAKE ONE TABLET BY MOUTH EVERY  DAY 90 tablet 1   atorvastatin  (LIPITOR ) 80 MG tablet TAKE 1 TABLET BY MOUTH DAILY 90 tablet 1   calcium  carbonate (TUMS EX) 750 MG chewable tablet Chew 2 tablets by mouth daily as needed for heartburn.     Cholecalciferol  25 MCG (1000 UT) tablet Take 1,000 Units by mouth daily.     cholestyramine (QUESTRAN) 4 g packet Take 4 g by mouth.     dapagliflozin  propanediol (FARXIGA ) 10 MG TABS tablet Take 1 tablet (10 mg total) by mouth daily before breakfast. 30 tablet 6   ELIQUIS  5 MG TABS tablet TAKE ONE TABLET TWICE DAILY 180 tablet 1   ezetimibe  (ZETIA ) 10 MG tablet TAKE 1 TABLET BY MOUTH DAILY 90 tablet 3   furosemide  (LASIX ) 40 MG tablet TAKE 1 TABLET BY MOUTH DAILY. TAKE AN EXTRA TABLET AS NEEDED AFTER LUNCH FOR ABDOMINAL SWELLING, LEG SWELLING OR SHORTNESS OF BREATH 180 tablet 1   gabapentin  (NEURONTIN ) 600 MG tablet Take 1 tablet (600 mg total) by mouth at bedtime. 90 tablet 3   Magnesium  300 MG TABS Take 300 mg by mouth daily.     Menthol, Topical  Analgesic, (BIOFREEZE EX) Apply 1 application. topically daily as needed (Neck pain).     Multiple Vitamin (MULTIVITAMIN) capsule Take 1 capsule by mouth daily.     nitroGLYCERIN  (NITROSTAT ) 0.4 MG SL tablet Place 1 tablet (0.4 mg total) under the tongue every 5 (five) minutes as needed for chest pain. 30 tablet 12   oxyCODONE  (ROXICODONE ) 5 MG immediate release tablet Take 1 tablet (5 mg total) by mouth every 8 (eight) hours as needed for up to 8 doses. 8 tablet 0   potassium chloride  (KLOR-CON ) 10 MEQ tablet Take 2 tablets (20 mEq total) by mouth daily. 60 tablet 10   Tafamidis  (VYNDAMAX ) 61 MG CAPS Take 1 capsule (61 mg total) by mouth daily. 30 capsule 11   Tiotropium Bromide -Olodaterol (STIOLTO RESPIMAT ) 2.5-2.5 MCG/ACT AERS Inhale 2 puffs into the lungs daily as needed. 3 each 4   No current facility-administered medications for this visit.    OBJECTIVE: Vitals:   05/29/24 1345  BP: 138/78  Pulse: 70  Resp: 18  Temp: (!) 96.2 F (35.7 C)  SpO2: 100%     Body mass index is 33.2 kg/m.    ECOG FS:0 - Asymptomatic  General: Well-developed, well-nourished, no acute distress. Eyes: Pink conjunctiva, anicteric sclera. HEENT: Normocephalic, moist mucous membranes. Lungs: No audible wheezing or coughing. Heart: Regular rate and rhythm. Abdomen: Soft, nontender, no obvious distention. Musculoskeletal: No edema, cyanosis, or clubbing. Neuro: Alert, answering all questions appropriately. Cranial nerves grossly intact. Skin: No rashes or petechiae noted. Psych: Normal affect.  LAB RESULTS:  Lab Results  Component Value Date   NA 141 05/27/2024   K 4.0 05/27/2024   CL 103 05/27/2024   CO2 25 05/27/2024   GLUCOSE 99 05/27/2024   BUN 21 05/27/2024   CREATININE 1.00 05/27/2024   CALCIUM  9.7 05/27/2024   PROT 7.1 01/11/2024   ALBUMIN 4.3 01/11/2024   AST 30 01/11/2024   ALT 27 01/11/2024   ALKPHOS 141 (H) 01/11/2024   BILITOT 0.7 01/11/2024   GFRNONAA 55 (L) 05/27/2024    GFRAA 75 12/09/2019    Lab Results  Component Value Date   WBC 7.0 05/27/2024   NEUTROABS 6.0 05/24/2023   HGB 14.1 05/27/2024   HCT 42.6 05/27/2024   MCV 94.0 05/27/2024   PLT 270 05/27/2024     STUDIES: MR BRAIN WO CONTRAST  Result Date: 05/28/2024 CLINICAL DATA:  Syncope/presyncope EXAM: MRI HEAD WITHOUT CONTRAST TECHNIQUE: Multiplanar, multiecho pulse sequences of the brain and surrounding structures were obtained without intravenous contrast. COMPARISON:  May 31, 2018 FINDINGS: MRI brain: The brain volume is normal. There are a few small foci of T2 hyperintensity in the cerebral white matter. These do not have restricted diffusion. There is no acute or chronic infarct. The ventricles are normal. No mass lesion. There are normal flow signals in the carotid arteries and basilar artery. No significant bone marrow signal abnormality. No significant abnormality in the paranasal sinuses or soft tissues. IMPRESSION: No significant abnormality Electronically Signed   By: Nancyann Burns M.D.   On: 05/28/2024 13:45    ASSESSMENT: MGUS.  PLAN:    MGUS: Patient's M spike remains stable ranging between 0.3 and 0.5 since January 2025.  Her most recent result was 0.4.  He has a mildly elevated IgM component ranging between 242 and 270 over the same timeframe.  Most recent result was 247.  Both kappa and lambda free light chains are elevated, but his ratio is within normal limits.  She has no evidence of endorgan damage.  No intervention is needed.  Patient does not require bone marrow biopsy.  Return to clinic in 6 months with repeat laboratory work and evaluation by APP. Dizziness: Patient does not complain of this today.  Unrelated to MGUS.  Continue follow-up with ENT and/or primary care.    I spent a total of 20 minutes reviewing chart data, face-to-face evaluation with the patient, counseling and coordination of care as detailed above.   Patient expressed understanding and was in agreement  with this plan. She also understands that She can call clinic at any time with any questions, concerns, or complaints.    Evalene JINNY Reusing, MD   05/30/2024 1:19 PM     "

## 2024-05-29 NOTE — Progress Notes (Signed)
 Daily Session Note  Patient Details  Name: Lauren Lloyd MRN: [id number] Date of Birth: 1940/06/01 Referring Provider:   Flowsheet Row Cardiac Rehab from 02/14/2024 in [facility] Cardiac and Pulmonary Rehab  Referring Provider Dr. Alm Clay    Encounter Date: 05/29/2024  Check In:  Session Check In - 05/29/24 0921       Check-In   Supervising physician immediately available to respond to emergencies See telemetry face sheet for immediately available ER MD    Location [facility]-Cardiac & Pulmonary Rehab    Staff Present Leita Franks RN,BSN;Joseph [facility] BS, Exercise Physiologist;Margaret Best, MS, Exercise Physiologist    Virtual Visit No    Medication changes reported     No    Fall or balance concerns reported    No    Tobacco Cessation No Change    Warm-up and Cool-down Performed on first and last piece of equipment    Resistance Training Performed Yes    VAD Patient? No    PAD/SET Patient? No      Pain Assessment   Currently in Pain? No/denies             Tobacco Use History[1]  Goals Met:  Independence with exercise equipment Exercise tolerated well No report of concerns or symptoms today Strength training completed today  Goals Unmet:  Not Applicable  Comments: Pt able to follow exercise prescription today without complaint.  Will continue to monitor for progression.    Dr. Oneil Pinal is Medical Director for [facility] Cardiac Rehabilitation.  Dr. Fuad Aleskerov is Medical Director for [facility] Pulmonary Rehabilitation.    [1]  Social History Tobacco Use  Smoking Status Former   Current packs/day: 0.00   Average packs/day: 1 pack/day for 30.0 years (30.0 ttl pk-yrs)   Types: Cigarettes   Start date: 05/17/1959   Quit date: 05/16/1989   Years since quitting: 35.0   Passive exposure: Past  Smokeless Tobacco Never

## 2024-05-30 ENCOUNTER — Other Ambulatory Visit: Payer: Self-pay

## 2024-05-31 ENCOUNTER — Encounter

## 2024-05-31 DIAGNOSIS — I214 Non-ST elevation (NSTEMI) myocardial infarction: Secondary | ICD-10-CM

## 2024-05-31 NOTE — Progress Notes (Signed)
 Daily Session Note  Patient Details  Name: Lauren Lloyd MRN: [id number] Date of Birth: [birth year] Referring Provider:   Flowsheet Row Cardiac Rehab from 02/14/2024 in [facility] Cardiac and Pulmonary Rehab  Referring Provider Dr. Alm Clay    Encounter Date: 05/31/2024  Check In:  Session Check In - 05/31/24 0924       Check-In   Supervising physician immediately available to respond to emergencies See telemetry face sheet for immediately available ER MD    Location [facility]-Cardiac & Pulmonary Rehab    Staff Present Burnard Davenport RN,BSN,MPA;Joseph Hood RCP,RRT,BSRT;Maxon Conetta BS, Exercise Physiologist;Noah Tickle, BS, Exercise Physiologist    Virtual Visit No    Medication changes reported     No    Fall or balance concerns reported    No    Tobacco Cessation No Change    Warm-up and Cool-down Performed on first and last piece of equipment    Resistance Training Performed Yes    VAD Patient? No    PAD/SET Patient? No      Pain Assessment   Currently in Pain? No/denies             Tobacco Use History[1]  Goals Met:  Proper associated with RPD/PD & O2 Sat Independence with exercise equipment Exercise tolerated well No report of concerns or symptoms today Strength training completed today  Goals Unmet:  Not Applicable  Comments: Pt able to follow exercise prescription today without complaint.  Will continue to monitor for progression.    Dr. Oneil Pinal is Medical Director for [facility] Cardiac Rehabilitation.  Dr. Fuad Aleskerov is Medical Director for [facility] Pulmonary Rehabilitation.    [1]  Social History Tobacco Use  Smoking Status Former   Current packs/day: 0.00   Average packs/day: 1 pack/day for 30.0 years (30.0 ttl pk-yrs)   Types: Cigarettes   Start date: 05/17/1959   Quit date: 05/16/1989   Years since quitting: 35.0   Passive exposure: Past  Smokeless Tobacco Never

## 2024-06-03 ENCOUNTER — Ambulatory Visit: Payer: Self-pay | Admitting: *Deleted

## 2024-06-03 ENCOUNTER — Encounter

## 2024-06-03 DIAGNOSIS — I214 Non-ST elevation (NSTEMI) myocardial infarction: Secondary | ICD-10-CM | POA: Diagnosis not present

## 2024-06-03 NOTE — Progress Notes (Signed)
 Daily Session Note  Patient Details  Name: Lauren Lloyd MRN: [id number] Date of Birth: August 31, 1940 Referring Provider:   Flowsheet Row Cardiac Rehab from 02/14/2024 in [facility] Cardiac and Pulmonary Rehab  Referring Provider Dr. Alm Clay    Encounter Date: 06/03/2024  Check In:  Session Check In - 06/03/24 0907       Check-In   Supervising physician immediately available to respond to emergencies See telemetry face sheet for immediately available ER MD    Location [facility]-Cardiac & Pulmonary Rehab    Staff Present Burnard Davenport RN,BSN,MPA;Maxon Burnell BS, Exercise Physiologist;Joseph [facility] BS, ACSM CEP, Exercise Physiologist    Virtual Visit No    Medication changes reported     Yes    Comments no longer taking Plavix     Fall or balance concerns reported    No    Tobacco Cessation No Change    Warm-up and Cool-down Performed on first and last piece of equipment    Resistance Training Performed Yes    VAD Patient? No    PAD/SET Patient? No      Pain Assessment   Currently in Pain? No/denies             Tobacco Use History[1]  Goals Met:  Proper associated with RPD/PD & O2 Sat Independence with exercise equipment Exercise tolerated well No report of concerns or symptoms today Strength training completed today  Goals Unmet:  Not Applicable  Comments: Pt able to follow exercise prescription today without complaint.  Will continue to monitor for progression.    Dr. Oneil Pinal is Medical Director for [facility] Cardiac Rehabilitation.  Dr. Fuad Aleskerov is Medical Director for [facility] Pulmonary Rehabilitation.    [1]  Social History Tobacco Use  Smoking Status Former   Current packs/day: 0.00   Average packs/day: 1 pack/day for 30.0 years (30.0 ttl pk-yrs)   Types: Cigarettes   Start date: 05/17/1959   Quit date: 05/16/1989   Years since quitting: 35.0   Passive exposure: Past  Smokeless Tobacco Never

## 2024-06-05 ENCOUNTER — Encounter

## 2024-06-07 ENCOUNTER — Encounter

## 2024-06-07 DIAGNOSIS — I214 Non-ST elevation (NSTEMI) myocardial infarction: Secondary | ICD-10-CM | POA: Diagnosis not present

## 2024-06-07 NOTE — Progress Notes (Signed)
 Daily Session Note  Patient Details  Name: Lauren Lloyd MRN: [id number] Date of Birth: March 09, 1941 Referring Provider:   Flowsheet Row Cardiac Rehab from 02/14/2024 in [facility] Cardiac and Pulmonary Rehab  Referring Provider Dr. Alm Clay    Encounter Date: 06/07/2024  Check In:  Session Check In - 06/07/24 0913       Check-In   Supervising physician immediately available to respond to emergencies See telemetry face sheet for immediately available ER MD    Location [facility]-Cardiac & Pulmonary Rehab    Staff Present Burnard Davenport RN,BSN,MPA;Maxon Conetta BS, Exercise Physiologist;Joseph Hood RCP,RRT,BSRT;Noah Tickle, [place], Exercise Physiologist    Virtual Visit No    Medication changes reported     No    Fall or balance concerns reported    No    Tobacco Cessation No Change    Warm-up and Cool-down Performed on first and last piece of equipment    Resistance Training Performed No   left before resistance completed   VAD Patient? No    PAD/SET Patient? No      Pain Assessment   Currently in Pain? No/denies             Tobacco Use History[1]  Goals Met:  Proper associated with RPD/PD & O2 Sat Independence with exercise equipment Exercise tolerated well No report of concerns or symptoms today Strength training completed today  Goals Unmet:  Not Applicable  Comments: Pt able to follow exercise prescription today without complaint.  Will continue to monitor for progression.    Dr. Oneil Pinal is Medical Director for [facility] Cardiac Rehabilitation.  Dr. Fuad Aleskerov is Medical Director for [facility] Pulmonary Rehabilitation.     [1]  Social History Tobacco Use  Smoking Status Former   Current packs/day: 0.00   Average packs/day: 1 pack/day for 30.0 years (30.0 ttl pk-yrs)   Types: Cigarettes   Start date: 05/17/1959   Quit date: 05/16/1989   Years since quitting: 35.0   Passive exposure: Past  Smokeless Tobacco Never

## 2024-06-10 ENCOUNTER — Encounter

## 2024-06-12 ENCOUNTER — Encounter

## 2024-06-12 ENCOUNTER — Telehealth (HOSPITAL_COMMUNITY): Payer: Self-pay

## 2024-06-12 ENCOUNTER — Other Ambulatory Visit (HOSPITAL_COMMUNITY): Payer: Self-pay

## 2024-06-14 ENCOUNTER — Encounter

## 2024-06-14 ENCOUNTER — Other Ambulatory Visit (HOSPITAL_COMMUNITY): Payer: Self-pay

## 2024-06-17 ENCOUNTER — Encounter

## 2024-06-17 NOTE — Addendum Note (Signed)
 Encounter addended by: Thayne Consuelo DEL on: 06/17/2024 11:17 AM  Actions taken: Imaging Exam ended

## 2024-06-17 NOTE — Telephone Encounter (Signed)
 Advanced Heart Failure Patient Advocate Encounter  Prior authorization for Vyndamax  [facility]) initially denied as this plan prefers Attruby . Pharmacist provided with information to review.  Rachel DEL, CPhT Rx Patient Advocate Phone: [phone]

## 2024-06-18 ENCOUNTER — Other Ambulatory Visit (HOSPITAL_COMMUNITY): Payer: Self-pay

## 2024-06-19 ENCOUNTER — Other Ambulatory Visit (HOSPITAL_COMMUNITY): Payer: Self-pay

## 2024-06-19 ENCOUNTER — Encounter: Attending: Cardiology

## 2024-06-19 DIAGNOSIS — I214 Non-ST elevation (NSTEMI) myocardial infarction: Secondary | ICD-10-CM

## 2024-06-19 NOTE — Progress Notes (Signed)
 Daily Session Note  Patient Details  Name: Lauren Lloyd MRN: [id number] Date of Birth: Jul 24, 1940 Referring Provider:   Flowsheet Row Cardiac Rehab from 02/14/2024 in [facility] Cardiac and Pulmonary Rehab  Referring Provider Dr. Alm Clay    Encounter Date: 06/19/2024  Check In:  Session Check In - 06/19/24 0943       Check-In   Supervising physician immediately available to respond to emergencies See telemetry face sheet for immediately available ER MD    Location [facility]-Cardiac & Pulmonary Rehab    Staff Present Leita Franks RN,BSN;Joseph [facility] BS, Exercise Physiologist;Margaret Best, MS, Exercise Physiologist    Virtual Visit No    Medication changes reported     No    Fall or balance concerns reported    No    Tobacco Cessation No Change    Warm-up and Cool-down Performed on first and last piece of equipment    Resistance Training Performed Yes    VAD Patient? No    PAD/SET Patient? No      Pain Assessment   Currently in Pain? No/denies             Tobacco Use History[1]  Goals Met:  Independence with exercise equipment Exercise tolerated well No report of concerns or symptoms today Strength training completed today  Goals Unmet:  Not Applicable  Comments: Pt able to follow exercise prescription today without complaint.  Will continue to monitor for progression.    Dr. Oneil Pinal is Medical Director for [facility] Cardiac Rehabilitation.  Dr. Fuad Aleskerov is Medical Director for [facility] Pulmonary Rehabilitation.    [1]  Social History Tobacco Use  Smoking Status Former   Current packs/day: 0.00   Average packs/day: 1 pack/day for 30.0 years (30.0 ttl pk-yrs)   Types: Cigarettes   Start date: 05/17/1959   Quit date: 05/16/1989   Years since quitting: 35.1   Passive exposure: Past  Smokeless Tobacco Never

## 2024-06-20 ENCOUNTER — Telehealth: Payer: Self-pay | Admitting: *Deleted

## 2024-06-20 NOTE — Telephone Encounter (Signed)
 I called and LMVM for her to return call for MRI results as not read in mychart.   om [name], RN To [name] and Delivered 06/03/2024  3:16 PM   Good afternoon,    The radiologist read your MRI Brain as no significant abnormalities.  I wanted to let you know this.  Dr. Gregg is out of the office this week  and will review when back in the office next week.  Please call us  with concerns/ questions.  Oncologist Last Read On Lauren Lloyd

## 2024-06-21 ENCOUNTER — Other Ambulatory Visit: Payer: Self-pay

## 2024-06-21 ENCOUNTER — Encounter

## 2024-06-21 ENCOUNTER — Other Ambulatory Visit (HOSPITAL_COMMUNITY): Payer: Self-pay

## 2024-06-21 ENCOUNTER — Encounter (HOSPITAL_COMMUNITY): Payer: Self-pay

## 2024-06-21 ENCOUNTER — Telehealth (HOSPITAL_COMMUNITY): Payer: Self-pay | Admitting: Pharmacist

## 2024-06-21 DIAGNOSIS — I214 Non-ST elevation (NSTEMI) myocardial infarction: Secondary | ICD-10-CM

## 2024-06-21 MED ORDER — ACORAMIDIS (712MG TWICE DAILY) 356 MG PO TBPK
712.0000 mg | ORAL_TABLET | Freq: Two times a day (BID) | ORAL | 11 refills | Status: AC
  Filled 2024-06-21: qty 112, 28d supply, fill #0

## 2024-06-21 NOTE — Telephone Encounter (Signed)
 Received message that Vyndamax  prior authorization was denied by [facility]. They now prefer Attruby  and would like the patient to try and fail that therapy before they will approve Vyndamax . Spoke with Dr. Zenaida and he was ok with with her continuing Vyndamax  or changing to the Attruby .   Called Ms. Dambach to inform her of the denial. She would prefer to change Vyndamax  to Attruby  rather than pursuing an appeal for the Vyndamax . Provided patient counseling on Attruby . She will get the Attruby  from [facility]. She has ~20 days left of her Vyndamax . She will finish that and then switch to the Attruby .   Medication list updated and prescription sent to [facility].  Tinnie Redman, PharmD, BCPS, BCCP, CPP Heart Failure Clinic Pharmacist [phone]

## 2024-06-21 NOTE — Progress Notes (Signed)
 Specialty Pharmacy Initiation Note   Lauren Lloyd is a 84 y.o. female who will be followed by the specialty pharmacy service for RxSp Cardiology    Review of administration, indication, effectiveness, safety, potential side effects, storage/disposable, and missed dose instructions occurred today for patient's specialty medication(s) Acoramidis  HCl     Patient/Caregiver did not have any additional questions or concerns.   Patient's therapy is appropriate to: Initiate    Goals Addressed             This Visit's Progress    Maintain optimal adherence to therapy       Patient is initiating therapy. Patient will maintain adherence         Jadasia Haws CHRISTELLA Redman Specialty Pharmacist

## 2024-06-21 NOTE — Progress Notes (Signed)
 Specialty Pharmacy Initial Fill Coordination Note  Lauren Lloyd is a 84 y.o. female contacted today regarding initial fill of specialty medication(s) Acoramidis  HCl   Patient requested Delivery   Delivery date: 06/26/24   Verified address: [address]   [place] [id number]   Medication will be filled on: 06/25/24   Patient is aware of $0 copayment.

## 2024-06-21 NOTE — Telephone Encounter (Signed)
 Patient changing therapy from Vyndamax  to Attruby 

## 2024-06-21 NOTE — Progress Notes (Signed)
 Daily Session Note  Patient Details  Name: Lauren Lloyd MRN: [id number] Date of Birth: 1941/02/26 Referring Provider:   Flowsheet Row Cardiac Rehab from 02/14/2024 in [facility]  Referring Provider Dr. Alm Clay    Encounter Date: 06/21/2024  Check In:  Session Check In - 06/21/24 0914       Check-In   Supervising physician immediately available to respond to emergencies See telemetry face sheet for immediately available ER MD    Location [facility]Card[facility] Pul[facility]    Staff Present Burnard Davenport RN,BSN,MPA;Joseph Hood RCP,RRT,BSRT;Maxon Conetta BS, Exercise Physiologist;Noah Tickle, BS, Exercise Physiologist    Virtual Visit No    Medication changes reported     No    Fall or balance concerns reported    No    Tobacco Cessation No Change    Warm-up and Cool-down Performed on first and last piece of equipment    Resistance Training Performed Yes    VAD Patient? No    PAD/SET Patient? No      Pain Assessment   Currently in Pain? No/denies             Tobacco Use History[1]  Goals Met:  Proper associated with RPD/PD & O2 Sat Independence with exercise equipment Exercise tolerated well No report of concerns or symptoms today Strength training completed today  Goals Unmet:  Not Applicable  Comments: Pt able to follow exercise prescription today without complaint.  Will continue to monitor for progression.    Dr. Oneil Pinal is Medical Director for [facility] Card[facility].  Dr. Fuad Aleskerov is Medical Director for [facility] Pulmonary Rehabilitation.    [1]  Social History Tobacco Use  Smoking Status Former   Current packs/day: 0.00   Average packs/day: 1 pack/day for 30.0 years (30.0 ttl pk-yrs)   Types: Cigarettes   Start date: 05/17/1959   Quit date: 05/16/1989   Years since quitting: 35.1   Passive exposure: Past  Smokeless Tobacco Never

## 2024-06-24 ENCOUNTER — Encounter

## 2024-06-26 ENCOUNTER — Encounter

## 2024-06-28 ENCOUNTER — Encounter

## 2024-07-01 ENCOUNTER — Encounter

## 2024-07-03 ENCOUNTER — Encounter

## 2024-07-24 ENCOUNTER — Ambulatory Visit

## 2024-10-23 ENCOUNTER — Ambulatory Visit

## 2024-11-25 ENCOUNTER — Inpatient Hospital Stay

## 2024-12-02 ENCOUNTER — Inpatient Hospital Stay: Admitting: Nurse Practitioner

## 2025-01-06 ENCOUNTER — Ambulatory Visit: Admitting: Family Medicine

## 2025-01-21 ENCOUNTER — Ambulatory Visit

## 2025-01-22 ENCOUNTER — Ambulatory Visit

## 2025-04-23 ENCOUNTER — Ambulatory Visit

## 2025-07-23 ENCOUNTER — Ambulatory Visit
